# Patient Record
Sex: Male | Born: 1945 | Race: Black or African American | Hispanic: No | Marital: Single | Smoking: Current every day smoker
Health system: Southern US, Community
[De-identification: ages and names within clinical notes are randomized; demographics above are authoritative.]

## PROBLEM LIST (undated history)

## (undated) DIAGNOSIS — A419 Sepsis, unspecified organism: Secondary | ICD-10-CM

## (undated) DIAGNOSIS — Z590 Homelessness: Secondary | ICD-10-CM

## (undated) DIAGNOSIS — N390 Urinary tract infection, site not specified: Secondary | ICD-10-CM

## (undated) DIAGNOSIS — N1330 Unspecified hydronephrosis: Secondary | ICD-10-CM

## (undated) DIAGNOSIS — C61 Malignant neoplasm of prostate: Secondary | ICD-10-CM

## (undated) DIAGNOSIS — N179 Acute kidney failure, unspecified: Secondary | ICD-10-CM

## (undated) DIAGNOSIS — F1010 Alcohol abuse, uncomplicated: Secondary | ICD-10-CM

## (undated) DIAGNOSIS — F1411 Cocaine abuse, in remission: Secondary | ICD-10-CM

## (undated) DIAGNOSIS — D638 Anemia in other chronic diseases classified elsewhere: Secondary | ICD-10-CM

## (undated) HISTORY — PX: PERCUTANEOUS NEPHROSTOMY: SHX2208

---

## 2006-09-17 ENCOUNTER — Ambulatory Visit: Payer: Self-pay | Admitting: Cardiology

## 2006-09-17 ENCOUNTER — Inpatient Hospital Stay (HOSPITAL_COMMUNITY): Admission: EM | Admit: 2006-09-17 | Discharge: 2006-09-21 | Payer: Self-pay | Admitting: Emergency Medicine

## 2006-09-20 ENCOUNTER — Ambulatory Visit: Payer: Self-pay | Admitting: Cardiology

## 2010-09-08 NOTE — Cardiovascular Report (Signed)
NAME:  Jeff, [name] NO.:  [id number]   MEDICAL RECORD NO.:  [id number]          PATIENT TYPE:  INP   LOCATION:  4703                         FACILITY:  [facility]   PHYSICIAN:  Salvadore Farber, MD  DATE OF BIRTH:  [birth year]   DATE OF PROCEDURE:  09/21/2006  DATE OF DISCHARGE:                            CARDIAC CATHETERIZATION   PROCEDURE:  Left heart catheterization, left ventriculography, coronary  angiography.   INDICATIONS FOR STUDY:  The patient is a 65 year old gentleman who  presents with chest pain after using cocaine.  He ruled out for  myocardial infarction.  He is referred for diagnostic angiography and  possible percutaneous coronary intervention.   PROCEDURE/TECHNIQUE:  Informed consent was obtained.  Then under 1%  lidocaine local anesthesia, a 5-French sheath was placed in the right  common femoral artery using modified Seldinger technique.  Diagnostic  angiography and ventriculography were performed using JL-4, JR-4, and  pigtail catheters.  The patient tolerated the procedure well and was  transferred to holding room in stable condition.  Sheath was removed  there.   COMPLICATIONS:  None.   FINDINGS:  1. LV: 129/7/15.  EF 50% with anterolateral and apical hypokinesis.  2. No aortic stenosis or mitral regurgitation.  3. Left main:  Large vessel which wraps the apex of the heart to      supply much of the inferior wall.  It has only minor luminal      irregularities.  4. Ramus intermedius:  Moderate-sized vessel which is angiographically      normal.  5. Circumflex:  Moderate-sized vessel giving rise to a single      marginal.  He has a 40% stenosis in its midsection.  6. RCA:  Moderate-sized dominant vessel.  There is a calcified 50%      lesion of the mid vessel.   IMPRESSION/PLAN:  The patient has moderate nonobstructive coronary  disease.  He does have mildly reduced left ventricular this hypokinesis.  The patient has regional wall  motion abnormality.  I suspect this is  related to his cocaine use.  Cessation of cocaine use is strongly  advised.  Aspirin should be continued given his coronary disease.  Attention to secondary      Salvadore Farber, MD  Electronically Signed     WED/MEDQ  D:  09/21/2006  T:  09/21/2006  Job:  [id number]

## 2010-09-08 NOTE — H&P (Signed)
NAME:  Jeff [name] NO.:  [id number]   MEDICAL RECORD NO.:  [id number]          PATIENT TYPE:  INP   LOCATION:  A204                          FACILITY:  [facility]   PHYSICIAN:  Gerrit Friends. Dietrich Pates, MD, FACCDATE OF BIRTH:  August 15, 1945   DATE OF ADMISSION:  09/17/2006  DATE OF DISCHARGE:                              HISTORY & PHYSICAL   REFERRING PHYSICIAN:  Skeet Latch, M.D, Hospitalist at [facility]   HISTORY OF PRESENT ILLNESS:  A 65 year old gentleman with no prior  cardiovascular history admitted to hospital with chest pain in the  setting of cocaine use.  [name] describes 30 minutes of  moderately severe substernal chest pain with both burning and stabbing  quality.  There was some associated numbness and right arm as well as  mild dyspnea and diaphoresis.  [name] describes similar less  intense spells over the past few months.  He has also had episodes of  sudden loss of consciousness with some preceding lightheadedness.  These  have occurred at various times, including when he has worked outside in  warm weather.  He regained consciousness immediately upon hitting the  ground.  He has not suffered any history.   The patient has used cocaine for the past 18 months or so, fairly  regularly in recent months.  He also smokes cigarettes..  There is no  history of diabetes.  He has been told of hypertension in the past, but  has not recently taking medication.   The patient's chest pain resolved prior to arrival at the hospital.  His  initial EKG showed inferior T-wave inversion and J-point elevation in  the anterolateral leads that appeared to be consistent with early  repolarization.  On subsequent EKGs, the ST-segment elevation appeared  more pathologic.  Cardiac markers have been minimally abnormal with peak  troponin of 0.09 and a peak CPK of 400 with negative MB.  The patient  has felt well since admission.   Past medical history is  uncertain.  There may have been a previous CVA.  He also describes a possible history of colon cancer.  He has not  received any medical care for the past 2 years.   ALLERGIES:  No known drug allergies.[name] Kitchen   MEDICATIONS:  No current medications.   SOCIAL HISTORY:  No excessive use of alcohol; 30 pack-year history of  cigarette smoking, which continues at one pack per day.  Resides locally  with relatives.   FAMILY HISTORY:  Negative for cardiovascular disease.   REVIEW OF SYSTEMS:  The patient describes possible recent episodes of  scant hemoptysis.  He also notes GERD symptoms.  All other systems  reviewed and are negative.   PHYSICAL EXAMINATION:  GENERAL:  Well-developed, healthy-appearing  gentleman in no acute distress.  VITAL SIGNS:  The blood pressure is 130/70, heart rate of 50 and  regular, respirations 20, O2 saturation 94% on room air.  HEENT:  Anicteric sclerae; normal lids and conjunctiva; normal oral  mucosa.  NECK:  No jugular venous distension; normal carotid upstrokes without  bruits.  ENDOCRINE:  No thyromegaly.  HEMATOPOIETIC:  No adenopathy.  SKIN:  Well-healed scar over the right forehead.  LUNGS:  Clear.  ABDOMEN:  Soft and nontender; normal bowel sounds; no masses; no  organomegaly.  EXTREMITIES:  Normal distal pulses; no edema; no clubbing.  NEUROMUSCULAR:  Symmetric strength and tone; normal cranial nerves.   EKG:  Sinus bradycardia; borderline first-degree AV block; low voltage  in limb leads; inferior T-wave inversion with nondiagnostic Q-waves; J-  point elevation anterolaterally.   LABORATORY DATA:  Other laboratory notable for total cholesterol of 208,  triglycerides of 50, HDL 64 and LDL 132.  CBC is normal.  Chemistry  profile is normal.  Cocaine and THC detected on drug screen.  Cloudy  urine with mild pyuria and hematuria on urinalysis.   IMPRESSION:  [name] presents with fairly impressive symptoms, EKG  abnormalities and minor  abnormalities in cardiac markers..  This could  all be related directly to the vasoconstrictive effects of cocaine, but  underlying coronary disease must be considered.  I discussed the  benefits and risks of cardiac catheterization with him; he agrees to  proceed.   He has sinus bradycardia with recent episodes of syncope.  I doubt that  these are due to brady arrhythmia, but continued in-hospital monitoring  will allow [place] to determine if he has any significant arrhythmias.   His history of syncope is consistent with orthostatic hypotension.  Lying and standing blood pressures will be measured.  He may benefit  from an event recorder after hospital discharge.   His use of cocaine is obviously a problem.  He had he indicates that he  will abstain from such usage in the future.   Per      Gerrit Friends. Dietrich Pates, MD, [facility]  Electronically Signed     RMR/MEDQ  D:  09/20/2006  T:  09/20/2006  Job:  [id number]

## 2010-09-08 NOTE — Discharge Summary (Signed)
NAME:  Jeff [name], Jeff [name] NO.:  [id number]   MEDICAL RECORD NO.:  [id number]          PATIENT TYPE:  INP   LOCATION:  4703                         FACILITY:  [facility]   PHYSICIAN:  Salvadore Farber, MD  DATE OF BIRTH:  [birth year]   DATE OF ADMISSION:  09/20/2006  DATE OF DISCHARGE:  09/21/2006                               DISCHARGE SUMMARY   DISCHARGING PHYSICIAN:  Dr. Randa Evens   DISCHARGE DIAGNOSIS:  Chest pain in the setting of crack cocaine use  status post cardiac catheterization this admission showing  nonobstructive coronary artery disease.   PAST MEDICAL HISTORY:  1. Polysubstance abuse.  Patient with positive drug screen this      admission for THC and cocaine derivative.  2. Tobacco use one pack per day for approximately 30+ years and      questionable previous CVA.  3. Questionable history of colon cancer  4. History of hypertension.   PROCEDURES THIS ADMISSION:  Cardiac catheterization on day of discharge.   HOSPITAL COURSE:  [name] is a 65 year old African American  gentleman with no prior cardiovascular history who was initially  admitted to [facility] with complaints of chest pain in the  setting of cocaine use.  The patient's chest pain apparently resolved  prior to his arrival at [facility] his initial EKG showed  inferior T-wave inversion and J point elevation and anterior lateral  leads that appeared to be consistent with early repolarization.  Cardiac  markers minimally abnormal with a peak troponin of 0.09.  The patient's  medical history is as stated above.  However, the patient is a poor  historian and has not sought medical care for the past 2 years, and has  been using cocaine for the past 18 months or so on a fairly regular  basis.  The patient was seen by Dr. Dietrich Pates in consultation.  EKG  abnormalities noted.  Mild abnormalities in cardiac markers.  Dr.  Dietrich Pates felt the patient would benefit from  further evaluation, and  cardiac catheterization was recommended.  The risks and benefits were  discussed with the patient, and the patient agreed to proceed.  The  patient was transported to [facility] for further evaluation, taken to  the cath lab on Sep 21, 2006, by Dr. Samule Ohm.  The patient was  nonobstructive CAD.  Cessation of cocaine advised with recommendations  to continue aspirin therapy status post cardiac  catheterization/appropriate bed rest with stable vital signs.  The  patient is being discharged home post cath site to right groin stable.  The patient has been given the post cardiac catheterization and  discharge instructions along with a follow-up appoint with Dr. Dietrich Pates  for post cath check on June 11 at 3:15.  He has been given our phone  number at the [place] office to call  if he has any problems with his cath site.  He is to continue aspirin  325 mg daily and abstain from substance use and tobacco products.  The  patient verbalizes understanding of discharge instructions.  Duration of  discharge encounter 25 minutes.  Dorian Pod, ACNP      Salvadore Farber, MD  Electronically Signed    MB/MEDQ  D:  09/21/2006  T:  09/21/2006  Job:  [id number]

## 2010-09-08 NOTE — H&P (Signed)
NAME:  Jeff [name], Jeff [name] NO.:  [id number]   MEDICAL RECORD NO.:  [id number]          PATIENT TYPE:  INP   LOCATION:  A304                          FACILITY:  [facility]   PHYSICIAN:  Skeet Latch, DO    DATE OF BIRTH:  [birth year]   DATE OF ADMISSION:  09/17/2006  DATE OF DISCHARGE:  LH                              HISTORY & PHYSICAL   CHIEF COMPLAINT:  Chest pain.   HISTORY OF PRESENT ILLNESS:  This is a 65 year old African-American male  who presents with complaint of chest pain. The patient states that at  approximately 1:30 today, he was still okay, then developed substernal  burning, stabbing type chest pain at approximately 1/2 hour later. The  patient states that the pain persisted for about a half hour or so and  he also had some numbness in his right arm. The patient also complains  of some shortness of breath and some increased sweating also. The  patient states that he has been having on and off chest pain for  approximately 4 to 5 month, that he has not been seen by a physician.  The patient admits to using cocaine for approximately 1 1/2 years, on  and off, but pretty regularly over the last 4 months. The patient denies  any significant medical history. He states that he was diagnosed with  hypertension that was improved with medication that he stopped over a  year ago. The patient states that his right arm weakness/heaviness is  slightly improved on my examination and he denies any active chest pain  at this point. The patient also admits to some hemoptysis on and off for  the past month or so. States that is not daily but he does get a taste  of blood in his mouth at times, in the mornings. The patient admits to  some reflux type symptoms on and off also, that he is not currently on  any medication for.   PAST MEDICAL HISTORY:  Positive for hypertension, questionable previous  CVA, and acid reflux.   SOCIAL HISTORY:  Occasional drinker. Is a 1 pack  per day smoker for  approximately 30+ years. Positive history for cocaine abuse. He lives  with relatives.   ALLERGIES:  NO KNOWN DRUG ALLERGIES.   CURRENT MEDICATIONS:  None.   FAMILY HISTORY:  Negative.   REVIEW OF SYSTEMS:  GENERAL:  Negative for any fever or dizziness.  HEENT:  No eye pain, blurred vision. No ear pain, throat pain.  CARDIOVASCULAR:  Positive for chest pain. RESPIRATORY:  Positive for  shortness of breath. No cough or wheezing. Occasional hemoptysis.  GASTROINTESTINAL:  No abdominal pain, vomiting, or diarrhea.  GENITOURINARY:  No dysuria or flank pain. MUSCULOSKELETAL: No  arthralgias or back pain. All other systems are negative.   PHYSICAL EXAMINATION:  VITAL SIGNS:  Temperature 98.2, pulse 65,  respiratory rate 20, blood pressure 121/75. The patient is sating 98% on  room air.  HEENT:  Head is atraumatic, normocephalic. PERRLA. EO MI.  NECK:  Supple, nontender, nondistended.  CARDIOVASCULAR:  Regular rate and rhythm. S1 and S2. No murmur,  rub, or  gallop.  RESPIRATORY:  Lungs clear to auscultation bilaterally .No rales,  rhonchi, or wheezing.  ABDOMEN:  Soft, nontender, and nondistended. No rigidity, guarding.  Positive bowel sounds.  EXTREMITIES:  No clubbing, cyanosis, or edema.  NEUROLOGIC:  Alert and oriented times three. Cranial nerves 2-12 are  grossly intact.   LABORATORY DATA:  EKG showed normal sinus rhythm. No STT wave changes.   Chest x-ray revealed no acute findings.   Sodium 138, potassium 4.3, chloride 105, CO2 26, glucose 97, BUN 15,  creatinine 1.19. Total protein 7.1, AST 29, ALT 18. Alcohol level less  than 5. Urine, trace of hemoglobin, small bilirubin. Troponin is less  than 0.05. CK MB 3.7. Myoglobin is 106. White blood cell count 6.6  Hemoglobin and hematocrit 13.3 and 38.6. Platelets 274,000. Urine drug  screen positive for cocaine, positive for cannabinoids.   ASSESSMENT/PLAN:  1. Chest pain, probably secondary to cocaine  abuse. The patient will      be admitted for chest pain to the telemetry unit. Will repeat CBC      and B-met in the a.m. Secondary to patient's history of on and off      chest pain for the past 4 to 6 months, cardiology consultation will      be obtained. Pain medication will be given at 4 to 6 hours if      needed. Will repeat an EKG in the a.m. and repeat his a.m. labs and      add a lipid and TSH. Continue to monitor cardiac enzymes.  2. Polysubstance abuse with cocaine. ACT Team evaluation will also be      obtained. Will get patient on benzodiazepine t.i.d. p.r.n. for now.      The patient will be placed on DVT as well as GI prophylaxis. I do      not appreciate any rhabdomyolysis at this time but will obtain an      creatinine kinase to rule out rhabdomyolysis. Will aggressively      hydrate the patient at this time.      Skeet Latch, DO  Electronically Signed     SM/MEDQ  D:  09/18/2006  T:  09/18/2006  Job:  [id number]

## 2010-09-08 NOTE — Discharge Summary (Signed)
NAME:  Jeff [name], Jeff [name] NO.:  [id number]   MEDICAL RECORD NO.:  [id number]          PATIENT TYPE:  INP   LOCATION:  A204                          FACILITY:  [facility]   PHYSICIAN:  Marcello Moores, MD   DATE OF BIRTH:  07/22/1945   DATE OF ADMISSION:  09/20/2006  DATE OF DISCHARGE:  05/27/2008LH                               DISCHARGE SUMMARY   PRIMARY MEDICAL DOCTOR:  Unassigned.   DIAGNOSIS ON TRANSFER:  1. Cocaine induced chest pain with EKG abnormality, ST elevation in      the lateral leads.  2. Asymptomatic bradycardia.  3. Polysubstance abuse including cocaine.   HOSPITAL COURSE:  [name] is 65 year old man with no significant  past medical history except polysubstance abuse who presents to the  emergency room with chest discomfort after he was using cocaine hours  before.  The details of the characteristic of the chest pain is detailed  in the admission note.  After he presented to the emergency room, the  first EKG as well as cardiac markers were normal, but subsequent cardiac  markers and EKG was abnormal with elevated troponin up to 0.09 and EKG  abnormality with ST elevation in V4-V5 and V6, and the patient was put  on MI protocol and the patient remained asymptomatic in his hospital  stay without any chest pain and any dizziness.  His pulse rate was from  45 up to 60, normal sinus rhythm, and today he was evaluated by  cardiologist and was transferred to [facility] for possible  cardiac catheterization.   PHYSICAL EXAMINATION:  GENERAL:  The patient is very stable,  asymptomatic.  VITAL SIGNS:  Temperature is 97, pulse rate is 53, respiratory rate is  18, systolic blood pressure is 120/76, saturation is 95%.  HEENT:  He has pink conjunctivae, nonicteric sclera.  CHEST:  Good air entry bilaterally clear  CVS:  S1, S2 well heard, regular, no murmur, sinus bradycardia.  ABDOMEN:  Soft, normoactive bowel sounds.  EXTREMITIES:  No pedal  edema.  CNS:  He is alert and well oriented.   LABORATORY DATA:  Today, sodium is 139, potassium 3.9, chloride is 107,  bicarb is 28, glucose is 88, BUN 8, creatinine 1.05.  Past troponin was  0.009, and his EKG is sinus bradycardia with persistent ST elevation on  the V4, V5 and V6.   MEDICATION DURING TRANSFER:  1. Nitroglycerin 0.4 mg sublingual p.r.n.  2. Morphine p.r.n..  3. Plavix 75 mg p.o. daily.  4. Lovenox 70 mg every 12 hours.  5. Aspirin 325 mg p.o. daily.   DISPOSITION:  The patient is very stable despite he has low pulse rate  and history of chest pain.  He remained asymptomatic in the hospital for  two days, but considering his episode of chest pain and abnormal EKG, he  was transferred for possible cardiac catheterization to look for  coronary artery disease and afterwards evaluated by cardiologist.      Marcello Moores, MD  Electronically Signed     MT/MEDQ  D:  09/20/2006  T:  09/20/2006  Job:  [id number]

## 2012-12-23 ENCOUNTER — Encounter (HOSPITAL_COMMUNITY): Payer: Self-pay | Admitting: *Deleted

## 2012-12-23 ENCOUNTER — Emergency Department (HOSPITAL_COMMUNITY)
Admission: EM | Admit: 2012-12-23 | Discharge: 2012-12-23 | Disposition: A | Discharge status: Home / Self Care | Payer: Medicare Other | Attending: Emergency Medicine | Admitting: Emergency Medicine

## 2012-12-23 DIAGNOSIS — F17200 Nicotine dependence, unspecified, uncomplicated: Secondary | ICD-10-CM | POA: Insufficient documentation

## 2012-12-23 DIAGNOSIS — S61219A Laceration without foreign body of unspecified finger without damage to nail, initial encounter: Secondary | ICD-10-CM

## 2012-12-23 DIAGNOSIS — Z23 Encounter for immunization: Secondary | ICD-10-CM | POA: Insufficient documentation

## 2012-12-23 DIAGNOSIS — W292XXA Contact with other powered household machinery, initial encounter: Secondary | ICD-10-CM | POA: Insufficient documentation

## 2012-12-23 DIAGNOSIS — Y9389 Activity, other specified: Secondary | ICD-10-CM | POA: Insufficient documentation

## 2012-12-23 DIAGNOSIS — S61209A Unspecified open wound of unspecified finger without damage to nail, initial encounter: Secondary | ICD-10-CM | POA: Insufficient documentation

## 2012-12-23 DIAGNOSIS — Y929 Unspecified place or not applicable: Secondary | ICD-10-CM | POA: Insufficient documentation

## 2012-12-23 MED ORDER — TRAMADOL HCL 50 MG PO TABS: 50.0000 mg | ORAL_TABLET | Freq: Four times a day (QID) | ORAL | Status: DC | PRN

## 2012-12-23 MED ORDER — LIDOCAINE HCL (PF) 1 % IJ SOLN
INTRAMUSCULAR | Status: AC
  Administered 2012-12-23: 5 mL
  Filled 2012-12-23: qty 15

## 2012-12-23 MED ORDER — NAPROXEN 500 MG PO TABS: 500.0000 mg | ORAL_TABLET | Freq: Two times a day (BID) | ORAL | Status: DC

## 2012-12-23 MED ORDER — BACITRACIN 500 UNIT/GM EX OINT
1.0000 "application " | TOPICAL_OINTMENT | Freq: Two times a day (BID) | CUTANEOUS | Status: DC
  Administered 2012-12-23: 1 via TOPICAL
  Filled 2012-12-23 (×5): qty 0.9

## 2012-12-23 MED ORDER — TETANUS-DIPHTH-ACELL PERTUSSIS 5-2.5-18.5 LF-MCG/0.5 IM SUSP
0.5000 mL | Freq: Once | INTRAMUSCULAR | Status: AC
  Administered 2012-12-23: 0.5 mL via INTRAMUSCULAR
  Filled 2012-12-23: qty 0.5

## 2012-12-23 MED ORDER — LIDOCAINE HCL (PF) 1 % IJ SOLN
INTRAMUSCULAR | Status: AC
  Filled 2012-12-23: qty 5

## 2012-12-23 NOTE — ED Notes (Signed)
Cut left index finger w/electric hedge trimmers today.   Tip of finger w/large irregular cuts over fingertip. Small, linear laceration below MIP.

## 2012-12-23 NOTE — ED Provider Notes (Addendum)
CSN: [id number]     Arrival date & time 12/23/12  1732 History  This chart was scribed for Vida Roller, MD, by Yevette Edwards, ED Scribe. This patient was seen in room APA03/APA03 and the patient's care was started at 5:57 PM.   First MD Initiated Contact with Patient 12/23/12 1750     Chief Complaint  Patient presents with  . Finger Injury    The history is provided by the patient. No language interpreter was used.   HPI Comments: Jeff [name] is a 67 y.o. male who presents to the Emergency Department complaining of an acute, macerated laceration to his left index finger which occurred six hours ago when he injured his index finger on a pair of electric hedge trimmers. He reports the pain to his finger is 9/10. The pt also has a smaller lac to the same digit. He denies any numbness to his finger. He is unsure of his last tetanus.n the bleeding was initially controlled with pressure which improved significantly.  No blood thinners.  History reviewed. No pertinent past medical history. History reviewed. No pertinent past surgical history. History reviewed. No pertinent family history. History  Substance Use Topics  . Smoking status: Current Every Day Smoker -- 0.50 packs/day    Types: Cigarettes  . Smokeless tobacco: Not on file  . Alcohol Use: Yes     Comment: occassional 2-3x week    Review of Systems  Constitutional: Negative for fever and chills.  Skin: Positive for wound.  Neurological: Negative for numbness.  All other systems reviewed and are negative.   Allergies  Review of patient's allergies indicates no known allergies.  Home Medications   Current Outpatient Rx  Name  Route  Sig  Dispense  Refill  . naproxen (NAPROSYN) 500 MG tablet   Oral   Take 1 tablet (500 mg total) by mouth 2 (two) times daily with a meal.   30 tablet   0   . traMADol (ULTRAM) 50 MG tablet   Oral   Take 1 tablet (50 mg total) by mouth every 6 (six) hours as needed for pain.   15  tablet   0     Triage Vitals: BP 124/69  Pulse 87  Temp(Src) 98.6 F (37 C) (Oral)  Resp 19  Ht 6' (1.829 m)  Wt 153 lb 4 oz (69.514 kg)  BMI 20.78 kg/m2  SpO2 97%  Physical Exam  Nursing note and vitals reviewed. Constitutional: He is oriented to person, place, and time. He appears well-developed and well-nourished. No distress.  HENT:  Head: Normocephalic and atraumatic.  Eyes: EOM are normal.  Neck: Neck supple. No tracheal deviation present.  Cardiovascular: Normal rate.   Pulmonary/Chest: Effort normal. No respiratory distress.  Musculoskeletal: Normal range of motion.  Neurological: He is alert and oriented to person, place, and time.  Skin: Skin is warm and dry.  Left second digit palmar surface 1.5 cm linear lac over the proximal phalanx. Stellate lac to palmar surface of distal phalanx of the same finger approx 3 cm in size No nail bed injury.  Psychiatric: He has a normal mood and affect. His behavior is normal.    ED Course  Procedures (including critical care time)  DIAGNOSTIC STUDIES:  Oxygen Saturation is 97% on room air, normal by my interpretation.    COORDINATION OF CARE:  6:05 PM- Discussed treatment plan with patient, and the patient agreed to the plan.   LACERATION REPAIR PROCEDURE NOTE The patient's identification was  confirmed and consent was obtained. This procedure was performed by Vida Roller, MD at 6:24 PM. Site: Left distal of second digit  Sterile procedures observed Anesthetic used (type and amt): 5 cc of lido; digital block  Suture type/size: 5.0 prolene  Length: 1.5 cm  # of Sutures: 2 Technique: simple interrupted Complexity: simple, linear, superficial Antibx ointment applied Tetanus ordered Site anesthetized, irrigated with 3 liters of NS, explored to the base of the wound- no foreign bodies visualized. Wound has good hemostasis. The site covered with dry, steriledressing.  Patient tolerated procedure well without  complications. Instructions for care discussed verbally and patient provided with additional written instructions for homecare and f/u with a hand specialist.    LACERATION REPAIR PROCEDURE NOTE The patient's identification was confirmed and consent was obtained. This procedure was performed by Vida Roller, MD at 6:35 PM. Site: Left distal of second digit  Sterile procedures observed Anesthetic used (type and amt): 5 cc of lido; digital block  Suture type/size: 5.0 prolene  Length: approximately 3 cm stellate  # of Sutures: 5 Technique: Simple interrupted Complexity: Complex, stellate, devitalized tissue removed by sharp debridement with scissors and forcept Antibx ointment applied Tetanus ordered Site anesthetized, irrigated with 3 liters of NS, explored to the base of the stellate wound-no foreign bodies seen.  Site covered with dry, sterile dressing.  Patient tolerated procedure well without complications. Instructions for care discussed verbally and patient provided with additional written instructions for homecare and f/u with a hand specialist.   Small area in middle of wound unable to be closed due to lack of tissue - topical abx applied - hand f/u.  Pt informed of signs of infection - need to return.  Agrees to plan.  I have visualized both wounds to the bottom of the wounds and no FB seen, normal ROM of all joints without swelling or deformity - no indication for imaging, this is all soft tissue.  In fact on initial exam before evaluation under anesthesia, the pt had FROM against resistance to flexion and extention and normal sensation of digit.  He has no visualized bone and no pain with palpation over the bones.    Labs Review Labs Reviewed - No data to display Imaging Review No results found.  MDM   1. Laceration of finger, initial encounter      I personally performed the services described in this documentation, which was scribed in my presence. The recorded  information has been reviewed and is accurate.        Vida Roller, MD 12/23/12 1850  Vida Roller, MD 01/01/13 [phone]

## 2012-12-23 NOTE — ED Notes (Signed)
Wound dressed with  Bacitracin, adaptic, sterile 2x2s and gauze.

## 2012-12-25 ENCOUNTER — Emergency Department (HOSPITAL_COMMUNITY)
Admission: EM | Admit: 2012-12-25 | Discharge: 2012-12-25 | Disposition: A | Discharge status: Home / Self Care | Payer: Medicare Other | Attending: Emergency Medicine | Admitting: Emergency Medicine

## 2012-12-25 ENCOUNTER — Encounter (HOSPITAL_COMMUNITY): Payer: Self-pay | Admitting: *Deleted

## 2012-12-25 DIAGNOSIS — F17200 Nicotine dependence, unspecified, uncomplicated: Secondary | ICD-10-CM | POA: Insufficient documentation

## 2012-12-25 DIAGNOSIS — S61219D Laceration without foreign body of unspecified finger without damage to nail, subsequent encounter: Secondary | ICD-10-CM

## 2012-12-25 DIAGNOSIS — Z4801 Encounter for change or removal of surgical wound dressing: Secondary | ICD-10-CM | POA: Insufficient documentation

## 2012-12-25 MED ORDER — SULFAMETHOXAZOLE-TRIMETHOPRIM 800-160 MG PO TABS: 1.0000 | ORAL_TABLET | Freq: Two times a day (BID) | ORAL | Status: DC

## 2012-12-25 MED ORDER — BACITRACIN-NEOMYCIN-POLYMYXIN 400-5-5000 EX OINT
TOPICAL_OINTMENT | Freq: Once | CUTANEOUS | Status: AC
  Administered 2012-12-25: 1 via TOPICAL
  Filled 2012-12-25: qty 1

## 2012-12-25 NOTE — ED Provider Notes (Signed)
CSN: [id number]     Arrival date & time 12/25/12  1147 History   First MD Initiated Contact with Patient 12/25/12 1216     No chief complaint on file.  (Consider location/radiation/quality/duration/timing/severity/associated sxs/prior Treatment) HPI Comments: Patient returns for recheck of a laceration repair to the left index finger.  States the laceration occurred as result of using hedge trimmers.  He was seen here on 12/23/12 and had sutures placed.  He reports some drainage and bleeding through the dressing and requests dressing change.  He denies fever, swelling, numbness or pain to the finger.  Patient is a 67 y.o. male presenting with wound check. The history is provided by the patient.  Wound Check This is a new problem. The current episode started in the past 7 days. The problem has been unchanged. Pertinent negatives include no arthralgias, chills, fever, joint swelling, numbness, swollen glands or weakness. The symptoms are aggravated by bending. He has tried nothing for the symptoms. The treatment provided mild relief.    History reviewed. No pertinent past medical history. History reviewed. No pertinent past surgical history. History reviewed. No pertinent family history. History  Substance Use Topics  . Smoking status: Current Every Day Smoker -- 0.50 packs/day    Types: Cigarettes  . Smokeless tobacco: Not on file  . Alcohol Use: Yes     Comment: occassional 2-3x week    Review of Systems  Constitutional: Negative for fever and chills.  Musculoskeletal: Negative for back pain, joint swelling and arthralgias.  Skin: Positive for wound. Negative for color change.       Laceration   Neurological: Negative for dizziness, weakness and numbness.  Hematological: Does not bruise/bleed easily.  All other systems reviewed and are negative.    Allergies  Review of patient's allergies indicates no known allergies.  Home Medications   Current Outpatient Rx  Name  Route  Sig   Dispense  Refill  . naproxen (NAPROSYN) 500 MG tablet   Oral   Take 1 tablet (500 mg total) by mouth 2 (two) times daily with a meal.   30 tablet   0   . sulfamethoxazole-trimethoprim (SEPTRA DS) 800-160 MG per tablet   Oral   Take 1 tablet by mouth 2 (two) times daily.   20 tablet   0   . traMADol (ULTRAM) 50 MG tablet   Oral   Take 1 tablet (50 mg total) by mouth every 6 (six) hours as needed for pain.   15 tablet   0    BP 103/58  Pulse 68  Temp(Src) 98 F (36.7 C) (Oral)  Resp 20  Ht 5\' 6"  (1.676 m)  Wt 153 lb (69.4 kg)  BMI 24.71 kg/m2  SpO2 97% Physical Exam  Nursing note and vitals reviewed. Constitutional: He is oriented to person, place, and time. He appears well-developed and well-nourished. No distress.  HENT:  Head: Normocephalic and atraumatic.  Cardiovascular: Normal rate, regular rhythm, normal heart sounds and intact distal pulses.   No murmur heard. Pulmonary/Chest: Effort normal and breath sounds normal. No respiratory distress.  Musculoskeletal: He exhibits no edema and no tenderness.  Neurological: He is alert and oriented to person, place, and time. He exhibits normal muscle tone. Coordination normal.  Skin: Skin is warm. Laceration noted.  Irregular laceration to the left index finger with sutures placed along the laceration border.  Intact sutures also present to a proximal lac of same finger.  No active bleeding, edema, purulent drainage or odor.  Pink  granulation tissue present.  Pt has full ROM of finger, NV intact    ED Course  Procedures (including critical care time) Labs Review Labs Reviewed - No data to display Imaging Review No results found.  MDM   1. Finger laceration, subsequent encounter     Irregular laceration to the palmar aspect of the distal left index finger.  No active bleeding or drainage, but bloody drainage present on his dressing.  granulation tissue is present.  Pt has full ROM of the finger.  NV intact.  Pt  agrees to f/u with hand surgeon, I will start patient on antibiotic  Patient was also evaluated by Dr. Blinda Leatherwood and care plan discussed.   Finger dressing applied.  Pt stable for discharge, understands importance of other f/u   Jeff [name] L. Jetty Berland, PA-C 12/27/12 1255

## 2012-12-25 NOTE — ED Notes (Signed)
Here for wound  Check and dsg change.  To LMF

## 2012-12-27 NOTE — ED Provider Notes (Signed)
Medical screening examination/treatment/procedure(s) were conducted as a shared visit with non-physician practitioner(s) and myself.  I personally evaluated the patient during the encounter.  Patient presents for repeat evaluation of his wound. Patient has had persistent bleeding because the wound had tissue and was not completely closed because of this. It appears to be healing well, no sign of infection.   Gilda Crease, MD 12/27/12 1345

## 2014-01-25 ENCOUNTER — Encounter (HOSPITAL_COMMUNITY): Payer: Self-pay | Admitting: Emergency Medicine

## 2014-01-25 ENCOUNTER — Inpatient Hospital Stay (HOSPITAL_COMMUNITY)
Admission: EM | Admit: 2014-01-25 | Discharge: 2014-02-01 | DRG: 982 | Disposition: A | Discharge status: Home Health | Payer: Medicare Other | Attending: Internal Medicine | Admitting: Internal Medicine

## 2014-01-25 DIAGNOSIS — N139 Obstructive and reflux uropathy, unspecified: Secondary | ICD-10-CM | POA: Diagnosis present

## 2014-01-25 DIAGNOSIS — F17210 Nicotine dependence, cigarettes, uncomplicated: Secondary | ICD-10-CM | POA: Diagnosis present

## 2014-01-25 DIAGNOSIS — IMO0002 Reserved for concepts with insufficient information to code with codable children: Secondary | ICD-10-CM | POA: Diagnosis present

## 2014-01-25 DIAGNOSIS — N179 Acute kidney failure, unspecified: Secondary | ICD-10-CM | POA: Diagnosis present

## 2014-01-25 DIAGNOSIS — F1410 Cocaine abuse, uncomplicated: Secondary | ICD-10-CM | POA: Diagnosis present

## 2014-01-25 DIAGNOSIS — K449 Diaphragmatic hernia without obstruction or gangrene: Secondary | ICD-10-CM | POA: Diagnosis present

## 2014-01-25 DIAGNOSIS — D638 Anemia in other chronic diseases classified elsewhere: Secondary | ICD-10-CM | POA: Diagnosis present

## 2014-01-25 DIAGNOSIS — Z809 Family history of malignant neoplasm, unspecified: Secondary | ICD-10-CM

## 2014-01-25 DIAGNOSIS — Z5900 Homelessness unspecified: Secondary | ICD-10-CM

## 2014-01-25 DIAGNOSIS — C61 Malignant neoplasm of prostate: Secondary | ICD-10-CM | POA: Diagnosis not present

## 2014-01-25 DIAGNOSIS — M899 Disorder of bone, unspecified: Secondary | ICD-10-CM | POA: Diagnosis present

## 2014-01-25 DIAGNOSIS — C799 Secondary malignant neoplasm of unspecified site: Secondary | ICD-10-CM | POA: Diagnosis present

## 2014-01-25 DIAGNOSIS — N1330 Unspecified hydronephrosis: Secondary | ICD-10-CM | POA: Diagnosis present

## 2014-01-25 DIAGNOSIS — M549 Dorsalgia, unspecified: Secondary | ICD-10-CM | POA: Diagnosis not present

## 2014-01-25 DIAGNOSIS — N1339 Other hydronephrosis: Secondary | ICD-10-CM | POA: Diagnosis present

## 2014-01-25 DIAGNOSIS — R229 Localized swelling, mass and lump, unspecified: Secondary | ICD-10-CM

## 2014-01-25 DIAGNOSIS — R1900 Intra-abdominal and pelvic swelling, mass and lump, unspecified site: Secondary | ICD-10-CM

## 2014-01-25 DIAGNOSIS — C786 Secondary malignant neoplasm of retroperitoneum and peritoneum: Secondary | ICD-10-CM | POA: Diagnosis present

## 2014-01-25 DIAGNOSIS — N189 Chronic kidney disease, unspecified: Secondary | ICD-10-CM | POA: Diagnosis present

## 2014-01-25 DIAGNOSIS — Z6820 Body mass index (BMI) 20.0-20.9, adult: Secondary | ICD-10-CM

## 2014-01-25 DIAGNOSIS — F1411 Cocaine abuse, in remission: Secondary | ICD-10-CM | POA: Diagnosis present

## 2014-01-25 DIAGNOSIS — C7951 Secondary malignant neoplasm of bone: Secondary | ICD-10-CM | POA: Diagnosis present

## 2014-01-25 HISTORY — DX: Malignant neoplasm of prostate: C61

## 2014-01-25 HISTORY — DX: Acute kidney failure, unspecified: N17.9

## 2014-01-25 HISTORY — DX: Homelessness: Z59.0

## 2014-01-25 HISTORY — DX: Anemia in other chronic diseases classified elsewhere: D63.8

## 2014-01-25 HISTORY — DX: Unspecified hydronephrosis: N13.30

## 2014-01-25 HISTORY — DX: Cocaine abuse, in remission: F14.11

## 2014-01-25 HISTORY — DX: Alcohol abuse, uncomplicated: F10.10

## 2014-01-25 LAB — URINALYSIS, ROUTINE W REFLEX MICROSCOPIC
BILIRUBIN URINE: NEGATIVE
GLUCOSE, UA: NEGATIVE mg/dL
Ketones, ur: NEGATIVE mg/dL
Leukocytes, UA: NEGATIVE
Nitrite: NEGATIVE
Protein, ur: 100 mg/dL — AB
SPECIFIC GRAVITY, URINE: 1.015 (ref 1.005–1.030)
UROBILINOGEN UA: 0.2 mg/dL (ref 0.0–1.0)
pH: 5.5 (ref 5.0–8.0)

## 2014-01-25 LAB — URINE MICROSCOPIC-ADD ON

## 2014-01-25 NOTE — ED Notes (Signed)
Pain rt lower back and down rt leg, onset today, Says he has been smoking crack today

## 2014-01-25 NOTE — ED Provider Notes (Signed)
CSN: [id number]     Arrival date & time 01/25/14  2139 History   First MD Initiated Contact with Patient 01/25/14 2313     Chief Complaint  Patient presents with  . Back Pain     (Consider location/radiation/quality/duration/timing/severity/associated sxs/prior Treatment) Patient is a 68 y.o. male presenting with back pain. The history is provided by the patient.  Back Pain Location:  Lumbar spine Radiates to:  R posterior upper leg Pain severity:  Severe Pain is:  Same all the time Duration:  2 hours Timing:  Constant Progression:  Unchanged Chronicity:  Chronic Relieved by:  Nothing Worsened by:  Ambulation, movement and bending (urination) Ineffective treatments:  None tried Associated symptoms: abdominal pain and dysuria   Associated symptoms: no bowel incontinence, no chest pain and no fever    Jeff [name] is a 68 y.o. male who presents to the ED with low back pain, abdominal pain and n/v that started approximately 9:30 tonight after he was smoking crack.   History reviewed. No pertinent past medical history. History reviewed. No pertinent past surgical history. History reviewed. No pertinent family history. History  Substance Use Topics  . Smoking status: Current Every Day Smoker -- 0.50 packs/day    Types: Cigarettes  . Smokeless tobacco: Not on file  . Alcohol Use: Yes     Comment: occassional 2-3x week    Review of Systems  Constitutional: Positive for chills. Negative for fever.  HENT: Negative.   Eyes: Negative for pain and visual disturbance.  Respiratory: Positive for cough. Negative for wheezing.   Cardiovascular: Negative for chest pain.  Gastrointestinal: Positive for nausea, vomiting and abdominal pain. Negative for bowel incontinence.  Genitourinary: Positive for dysuria.  Musculoskeletal: Positive for back pain.  Skin: Negative for rash.  Psychiatric/Behavioral: Negative for confusion. The patient is not nervous/anxious.       Allergies   Review of patient's allergies indicates no known allergies.  Home Medications   Prior to Admission medications   Medication Sig Start Date End Date Taking? Authorizing Provider  naproxen (NAPROSYN) 500 MG tablet Take 1 tablet (500 mg total) by mouth 2 (two) times daily with a meal. 12/23/12   Johnna Acosta, MD  sulfamethoxazole-trimethoprim (SEPTRA DS) 800-160 MG per tablet Take 1 tablet by mouth 2 (two) times daily. 12/25/12   Tammy L. Triplett, PA-C  traMADol (ULTRAM) 50 MG tablet Take 1 tablet (50 mg total) by mouth every 6 (six) hours as needed for pain. 12/23/12   Johnna Acosta, MD   BP 119/67  Pulse 81  Temp(Src) 97.6 F (36.4 C) (Oral)  Ht 6' (1.829 m)  Wt 135 lb (61.236 kg)  BMI 18.31 kg/m2  SpO2 100% Physical Exam  Nursing note and vitals reviewed. Constitutional: He is oriented to person, place, and time. He appears well-developed and well-nourished.  HENT:  Head: Normocephalic.  Eyes: EOM are normal.  Neck: Neck supple.  Cardiovascular: Normal rate.   Pulmonary/Chest: Effort normal.  Abdominal: Soft. There is tenderness in the suprapubic area.  Right flank pain  Musculoskeletal: Normal range of motion.  Neurological: He is alert and oriented to person, place, and time. No cranial nerve deficit.  Skin: Skin is warm and dry.  Psychiatric: He has a normal mood and affect. His behavior is normal.    ED Course  Procedures  MDM   Results for orders placed during the hospital encounter of 01/25/14 (from the past 24 hour(s))  URINALYSIS, ROUTINE W REFLEX MICROSCOPIC  Status: Abnormal   Collection Time    01/25/14 11:31 PM      Result Value Ref Range   Color, Urine YELLOW  YELLOW   APPearance CLEAR  CLEAR   Specific Gravity, Urine 1.015  1.005 - 1.030   pH 5.5  5.0 - 8.0   Glucose, UA NEGATIVE  NEGATIVE mg/dL   Hgb urine dipstick LARGE (*) NEGATIVE   Bilirubin Urine NEGATIVE  NEGATIVE   Ketones, ur NEGATIVE  NEGATIVE mg/dL   Protein, ur 100 (*) NEGATIVE mg/dL    Urobilinogen, UA 0.2  0.0 - 1.0 mg/dL   Nitrite NEGATIVE  NEGATIVE   Leukocytes, UA NEGATIVE  NEGATIVE  URINE MICROSCOPIC-ADD ON     Status: Abnormal   Collection Time    01/25/14 11:31 PM      Result Value Ref Range   Squamous Epithelial / LPF RARE  RARE   WBC, UA 3-6  <3 WBC/hpf   RBC / HPF 11-20  <3 RBC/hpf   Bacteria, UA FEW (*) RARE    68 y.o. male with hematuria, dysuria, nausea and abdominal and flank pain that started earlier tonight. Patient awaiting CT scan. Care turned over to Dr. Stark Jock @ [place] am.    Jeff Murrain, NP 01/26/14 [phone]

## 2014-01-26 ENCOUNTER — Inpatient Hospital Stay (HOSPITAL_COMMUNITY): Payer: Medicare Other

## 2014-01-26 ENCOUNTER — Encounter (HOSPITAL_COMMUNITY): Payer: Self-pay | Admitting: Emergency Medicine

## 2014-01-26 ENCOUNTER — Emergency Department (HOSPITAL_COMMUNITY): Payer: Medicare Other

## 2014-01-26 DIAGNOSIS — C7951 Secondary malignant neoplasm of bone: Secondary | ICD-10-CM | POA: Diagnosis not present

## 2014-01-26 DIAGNOSIS — K449 Diaphragmatic hernia without obstruction or gangrene: Secondary | ICD-10-CM | POA: Diagnosis not present

## 2014-01-26 DIAGNOSIS — M899 Disorder of bone, unspecified: Secondary | ICD-10-CM | POA: Diagnosis present

## 2014-01-26 DIAGNOSIS — N179 Acute kidney failure, unspecified: Secondary | ICD-10-CM | POA: Diagnosis not present

## 2014-01-26 DIAGNOSIS — C799 Secondary malignant neoplasm of unspecified site: Secondary | ICD-10-CM | POA: Diagnosis not present

## 2014-01-26 DIAGNOSIS — R1900 Intra-abdominal and pelvic swelling, mass and lump, unspecified site: Secondary | ICD-10-CM

## 2014-01-26 DIAGNOSIS — C786 Secondary malignant neoplasm of retroperitoneum and peritoneum: Secondary | ICD-10-CM | POA: Diagnosis not present

## 2014-01-26 DIAGNOSIS — IMO0002 Reserved for concepts with insufficient information to code with codable children: Secondary | ICD-10-CM | POA: Diagnosis present

## 2014-01-26 DIAGNOSIS — Z590 Homelessness: Secondary | ICD-10-CM | POA: Diagnosis not present

## 2014-01-26 DIAGNOSIS — C61 Malignant neoplasm of prostate: Secondary | ICD-10-CM | POA: Diagnosis present

## 2014-01-26 DIAGNOSIS — Z6820 Body mass index (BMI) 20.0-20.9, adult: Secondary | ICD-10-CM | POA: Diagnosis not present

## 2014-01-26 DIAGNOSIS — F1410 Cocaine abuse, uncomplicated: Secondary | ICD-10-CM | POA: Diagnosis not present

## 2014-01-26 DIAGNOSIS — F1411 Cocaine abuse, in remission: Secondary | ICD-10-CM

## 2014-01-26 DIAGNOSIS — N139 Obstructive and reflux uropathy, unspecified: Secondary | ICD-10-CM | POA: Diagnosis not present

## 2014-01-26 DIAGNOSIS — Z809 Family history of malignant neoplasm, unspecified: Secondary | ICD-10-CM | POA: Diagnosis not present

## 2014-01-26 DIAGNOSIS — R229 Localized swelling, mass and lump, unspecified: Secondary | ICD-10-CM

## 2014-01-26 DIAGNOSIS — D638 Anemia in other chronic diseases classified elsewhere: Secondary | ICD-10-CM | POA: Diagnosis not present

## 2014-01-26 DIAGNOSIS — F17210 Nicotine dependence, cigarettes, uncomplicated: Secondary | ICD-10-CM | POA: Diagnosis not present

## 2014-01-26 DIAGNOSIS — N1339 Other hydronephrosis: Secondary | ICD-10-CM | POA: Diagnosis not present

## 2014-01-26 DIAGNOSIS — N1330 Unspecified hydronephrosis: Secondary | ICD-10-CM

## 2014-01-26 DIAGNOSIS — M549 Dorsalgia, unspecified: Secondary | ICD-10-CM | POA: Diagnosis present

## 2014-01-26 DIAGNOSIS — N189 Chronic kidney disease, unspecified: Secondary | ICD-10-CM | POA: Diagnosis not present

## 2014-01-26 HISTORY — DX: Anemia in other chronic diseases classified elsewhere: D63.8

## 2014-01-26 HISTORY — DX: Unspecified hydronephrosis: N13.30

## 2014-01-26 HISTORY — DX: Cocaine abuse, in remission: F14.11

## 2014-01-26 HISTORY — DX: Acute kidney failure, unspecified: N17.9

## 2014-01-26 LAB — COMPREHENSIVE METABOLIC PANEL
ALK PHOS: 137 U/L — AB (ref 39–117)
ALT: 7 U/L (ref 0–53)
AST: 32 U/L (ref 0–37)
Albumin: 3.6 g/dL (ref 3.5–5.2)
Anion gap: 14 (ref 5–15)
BUN: 17 mg/dL (ref 6–23)
CALCIUM: 9.2 mg/dL (ref 8.4–10.5)
CHLORIDE: 100 meq/L (ref 96–112)
CO2: 22 meq/L (ref 19–32)
Creatinine, Ser: 1.56 mg/dL — ABNORMAL HIGH (ref 0.50–1.35)
GFR, EST AFRICAN AMERICAN: 51 mL/min — AB (ref >90)
GFR, EST NON AFRICAN AMERICAN: 44 mL/min — AB (ref >90)
GLUCOSE: 84 mg/dL (ref 70–99)
POTASSIUM: 4.4 meq/L (ref 3.7–5.3)
SODIUM: 136 meq/L — AB (ref 137–147)
Total Bilirubin: 0.3 mg/dL (ref 0.3–1.2)
Total Protein: 7.4 g/dL (ref 6.0–8.3)

## 2014-01-26 LAB — CBC WITH DIFFERENTIAL/PLATELET
BASOS ABS: 0 10*3/uL (ref 0.0–0.1)
Basophils Relative: 0 % (ref 0–1)
Eosinophils Absolute: 0.1 10*3/uL (ref 0.0–0.7)
Eosinophils Relative: 1 % (ref 0–5)
HCT: 29.2 % — ABNORMAL LOW (ref 39.0–52.0)
Hemoglobin: 10.1 g/dL — ABNORMAL LOW (ref 13.0–17.0)
LYMPHS ABS: 1.3 10*3/uL (ref 0.7–4.0)
LYMPHS PCT: 20 % (ref 12–46)
MCH: 30 pg (ref 26.0–34.0)
MCHC: 34.6 g/dL (ref 30.0–36.0)
MCV: 86.6 fL (ref 78.0–100.0)
Monocytes Absolute: 0.5 10*3/uL (ref 0.1–1.0)
Monocytes Relative: 8 % (ref 3–12)
NEUTROS ABS: 4.4 10*3/uL (ref 1.7–7.7)
NEUTROS PCT: 71 % (ref 43–77)
PLATELETS: 327 10*3/uL (ref 150–400)
RBC: 3.37 MIL/uL — AB (ref 4.22–5.81)
RDW: 14.8 % (ref 11.5–15.5)
WBC: 6.3 10*3/uL (ref 4.0–10.5)

## 2014-01-26 LAB — RAPID URINE DRUG SCREEN, HOSP PERFORMED
Amphetamines: NOT DETECTED
Barbiturates: NOT DETECTED
Benzodiazepines: NOT DETECTED
COCAINE: POSITIVE — AB
Opiates: NOT DETECTED
Tetrahydrocannabinol: NOT DETECTED

## 2014-01-26 LAB — ETHANOL: Alcohol, Ethyl (B): <11 mg/dL (ref 0–11)

## 2014-01-26 LAB — HIV ANTIBODY (ROUTINE TESTING W REFLEX): HIV 1&2 Ab, 4th Generation: NONREACTIVE

## 2014-01-26 MED ORDER — SENNA 8.6 MG PO TABS
1.0000 | ORAL_TABLET | Freq: Two times a day (BID) | ORAL | Status: DC
  Administered 2014-01-26 – 2014-01-31 (×11): 8.6 mg via ORAL
  Filled 2014-01-26 (×16): qty 1

## 2014-01-26 MED ORDER — ACETAMINOPHEN 325 MG PO TABS
650.0000 mg | ORAL_TABLET | Freq: Four times a day (QID) | ORAL | Status: DC | PRN
  Administered 2014-01-28: 650 mg via ORAL
  Filled 2014-01-26: qty 2

## 2014-01-26 MED ORDER — SODIUM CHLORIDE 0.9 % IJ SOLN: 3.0000 mL | INTRAMUSCULAR | Status: DC | PRN

## 2014-01-26 MED ORDER — SODIUM CHLORIDE 0.9 % IJ SOLN: 3.0000 mL | Freq: Two times a day (BID) | INTRAMUSCULAR | Status: DC

## 2014-01-26 MED ORDER — NICOTINE 14 MG/24HR TD PT24
14.0000 mg | MEDICATED_PATCH | Freq: Every day | TRANSDERMAL | Status: DC
  Administered 2014-01-26 – 2014-02-01 (×7): 14 mg via TRANSDERMAL
  Filled 2014-01-26 (×7): qty 1

## 2014-01-26 MED ORDER — MORPHINE SULFATE 2 MG/ML IJ SOLN
2.0000 mg | INTRAMUSCULAR | Status: DC | PRN
  Administered 2014-01-28: 2 mg via INTRAVENOUS
  Filled 2014-01-26: qty 1

## 2014-01-26 MED ORDER — ONDANSETRON HCL 4 MG PO TABS: 4.0000 mg | ORAL_TABLET | Freq: Four times a day (QID) | ORAL | Status: DC | PRN

## 2014-01-26 MED ORDER — SODIUM CHLORIDE 0.9 % IV SOLN: 250.0000 mL | INTRAVENOUS | Status: DC | PRN

## 2014-01-26 MED ORDER — INFLUENZA VAC SPLIT QUAD 0.5 ML IM SUSY
0.5000 mL | PREFILLED_SYRINGE | INTRAMUSCULAR | Status: AC
  Administered 2014-01-27: 0.5 mL via INTRAMUSCULAR
  Filled 2014-01-26 (×2): qty 0.5

## 2014-01-26 MED ORDER — SODIUM CHLORIDE 0.45 % IV SOLN
INTRAVENOUS | Status: DC
  Administered 2014-01-26: 1000 mL via INTRAVENOUS
  Administered 2014-01-26 – 2014-01-31 (×11): via INTRAVENOUS

## 2014-01-26 MED ORDER — ACETAMINOPHEN 650 MG RE SUPP: 650.0000 mg | Freq: Four times a day (QID) | RECTAL | Status: DC | PRN

## 2014-01-26 MED ORDER — POLYETHYLENE GLYCOL 3350 17 G PO PACK
17.0000 g | PACK | Freq: Every day | ORAL | Status: DC | PRN
  Filled 2014-01-26: qty 1

## 2014-01-26 MED ORDER — HEPARIN SODIUM (PORCINE) 5000 UNIT/ML IJ SOLN
5000.0000 [IU] | Freq: Three times a day (TID) | INTRAMUSCULAR | Status: DC
  Administered 2014-01-26 – 2014-01-27 (×3): 5000 [IU] via SUBCUTANEOUS
  Filled 2014-01-26 (×6): qty 1

## 2014-01-26 MED ORDER — ONDANSETRON HCL 4 MG/2ML IJ SOLN: 4.0000 mg | Freq: Four times a day (QID) | INTRAMUSCULAR | Status: DC | PRN

## 2014-01-26 MED ORDER — DEXAMETHASONE SODIUM PHOSPHATE 4 MG/ML IJ SOLN
4.0000 mg | Freq: Four times a day (QID) | INTRAMUSCULAR | Status: DC
Start: 2014-01-26 — End: 2014-01-26
  Filled 2014-01-26 (×4): qty 1

## 2014-01-26 NOTE — ED Provider Notes (Signed)
Medical screening examination/treatment/procedure(s) were conducted as a shared visit with non-physician practitioner(s) and myself.  I personally evaluated the patient during the encounter. Patient is a 68 year old male with no significant past medical history. He presents today with complaints of pain in his lower back radiating to his right thigh. This started earlier today and has been constant. He denies any new activities or injury, but does report he smoked crack cocaine earlier today. He denies any bowel or bladder complaints. He denies any fever.  On exam, vitals are stable and the patient is afebrile. Head is atraumatic, normocephalic. Neck is supple. Heart is regular rate and rhythm and lungs are clear. Abdomen is soft, nontender, nondistended. His low back is tender to palpation in the soft tissues of the lumbar region.  Workup reveals no significant findings on his EKG, however his urinalysis reveals microscopic hematuria. This raises the possibility for renal calculus, and a CT scan with renal protocol was performed. Unfortunately this revealed multiple lesions in the retroperitoneum consistent with enlarged lymph nodes or potentially metastatic disease. As this patient has no primary physician and no followup, the decision has been made to pursue admission for diagnosis and initiation of treatment. He will be evaluated by the hospitalist who later arrangements for transfer to [facility].   EKG Interpretation   Date/Time:  Saturday January 26 2014 00:15:38 EDT Ventricular Rate:  75 PR Interval:  149 QRS Duration: 89 QT Interval:  413 QTC Calculation: 461 R Axis:   -78 Text Interpretation:  Sinus rhythm Multiform ventricular premature  complexes Left axis deviation Low voltage, extremity leads Consider  anterolateral infarct Baseline wander in lead(s) V5 Confirmed by Beau Fanny   MD, Wally Shevchenko ([id number]) on 01/26/2014 12:32:25 AM       Veryl Speak, MD 01/26/14 8938

## 2014-01-26 NOTE — H&P (Addendum)
[facility] History and Physical  Jeff [name] XKP:[id number] DOB: 1945/12/12 DOA: 01/25/2014  Referring physician: Dr. Gerrianne Scale - [facility] PCP: No primary provider on file.  Specialists: Dr. Alen Blew - oncology  Chief Complaint: back pain  Assessment/Plan Active Problems: Mass Drug abuse Renal insufficiency Tobacco abuse  Cancer: Massive retroperitoneal and pelvic lymphadenopathy (12.9x7.2cm) along w/ scattered metastatic bone lesions and possible lung mets on CT. Unfortunately non-contrast study. Pt has not been to a doctor since the 90s. Discussed case w/ Dr. Alen Blew who agreed to have pt seen at [facility] ED in order to ensure pts workup is expedited as there is high concern that this pt will ge lost to follow-up. Alk Phos 137 likely from bony mets.  - Admit and transfer to [facility] - med surge - Oncology to see after transfer  Renal insufficiency: Cr 1.56. No previous labs/baseline. Likely CKD w/ hydronephrosis from mass as discussed above. No previous chemistry to compare - Urology consult recommended - BMET in am - 1/2NS 145m/hr  Drug abuse: endorses smoking crack cocaine - Abuse counseling - UDS - CIWA - ETOH  Tobacco abuse: smokes 1/2ppd - Nicotine patch  DVT Prophylaxis: Hep [place] TID  Code Status: FULL Family Communication: Son not present at time of admission [facility]  HPI: Jeff [name] a 68y.[facility]. [facility]  Back pain. Started on 10/2 in the evening after smoking crack. Associated w/ nausea and emesis. Denies diarrhea, fevers, CP, SOB, palpitations, dysuria, frequency. Endorses difficulty passing urine. Last trip to doctor was in the mid 1990s.   Review of Systems: Per HPI w/ all other systems negative.   History reviewed. No pertinent past medical history. History reviewed. No pertinent past surgical history. Social History:  History   Social History Narrative  . No narrative on file    No  Known Allergies  Family History  Problem Relation Age of Onset  . Cancer Brother 60     Prior to Admission medications   Medication Sig Start Date End Date Taking? Authorizing Provider  naproxen (NAPROSYN) 500 MG tablet Take 1 tablet (500 mg total) by mouth 2 (two) times daily with a meal. 12/23/12   BJohnna Acosta MD  sulfamethoxazole-trimethoprim (SEPTRA DS) 800-160 MG per tablet Take 1 tablet by mouth 2 (two) times daily. 12/25/12   Tammy L. Triplett, PA-C  traMADol (ULTRAM) 50 MG tablet Take 1 tablet (50 mg total) by mouth every 6 (six) hours as needed for pain. 12/23/12   BJohnna Acosta MD   Physical Exam: Filed Vitals:   01/26/14 0003 01/26/14 0030 01/26/14 0100 01/26/14 0249  BP:  115/83 129/75 99/52  Pulse:  76 80 77  Temp: 97.8 F (36.6 C)     TempSrc: Oral     Resp:  29 24 33  Height:      Weight:      SpO2:  98% 96% 99%     General:  Thin and elderly, NAD  Eyes: EOMI   ENT: mmm,  Neck: FROM  Cardiovascular: faint heart sounds, RRR, no m/r/g  Respiratory: Nml WOB. No wheezes, ronchi. Good breath sounds throughout  Abdomen: NABS, nonttp  Skin: warm, well perfused, intact  GU: anal sphincter intact, large mass/prostate felt almost immediately upon DRE. Non-tender  Musculoskeletal: No LE edema, moves all extremities spontaneously   Psychiatric: nml affect  Neurologic: CN2-12 Grossly intact, cerebellar fxn nml  Labs on Admission:  Basic Metabolic Panel:  Recent Labs Lab 01/26/14 0300  NA 136*  K 4.4  CL 100  CO2 22  GLUCOSE 84  BUN 17  CREATININE 1.56*  CALCIUM 9.2   Liver Function Tests:  Recent Labs Lab 01/26/14 0300  AST 32  ALT 7  ALKPHOS 137*  BILITOT 0.3  PROT 7.4  ALBUMIN 3.6   No results found for this basename: LIPASE, AMYLASE,  in the last 168 hours No results found for this basename: AMMONIA,  in the last 168 hours CBC:  Recent Labs Lab 01/26/14 0300  WBC 6.3  NEUTROABS 4.4  HGB 10.1*  HCT 29.2*  MCV 86.6  PLT  327   Cardiac Enzymes: No results found for this basename: CKTOTAL, CKMB, CKMBINDEX, TROPONINI,  in the last 168 hours  BNP (last 3 results) No results found for this basename: PROBNP,  in the last 8760 hours CBG: No results found for this basename: GLUCAP,  in the last 168 hours  Radiological Exams on Admission: Ct Renal Stone Study  01/26/2014   CLINICAL DATA:  Right-sided flank pain, nausea, and is curious starting at 9:30 p.m. tonight. Small amount of hematuria and burning.  EXAM: CT ABDOMEN AND PELVIS WITHOUT CONTRAST  TECHNIQUE: Multidetector CT imaging of the abdomen and pelvis was performed following the standard protocol without IV contrast.  COMPARISON:  None.  FINDINGS: Emphysematous changes in the lung bases with patchy nodular ground-glass opacities, indeterminate. Small esophageal hiatal hernia.  Evaluation is limited due to noncontrast technique, but there appears to be massive retroperitoneal lymphadenopathy, extending into the pelvis all the way down to the level of the symphysis pubis. Largest cross-sectional area measures about 7.2 x 12.9 cm transverse dimension. The multifocal nodular areas of bone sclerosis demonstrated throughout the visualized spine, ribs, sacrum, pelvis, and hips. Findings likely to represent lymphoma although extensive metastatic disease could also potentially have this appearance. There is a bilateral renal hydronephrosis and hydroureter, greater on the left side. This is likely due to extrinsic compression by the prominent lymphadenopathy. The bladder is decompressed and is displaced anteriorly and laterally. Bladder wall is thickened, possibly due decompression or infection. The unenhanced appearance of the liver, spleen, gallbladder, and adrenal glands is unremarkable. Aorta, inferior vena cava, and pancreas cannot be evaluated due to lymphadenopathy.  IMPRESSION: Massive retroperitoneal and pelvic lymphadenopathy, diffuse sclerotic bone lesions, and  indeterminate small nodular changes in the lungs likely representing lymphoma but extensive metastatic disease is also possible. There is bilateral hydronephrosis caused by extrinsic compression of the ureters. Bladder is displaced. Small esophageal hiatal hernia.   Electronically Signed   By: Lucienne Capers M.D.   On: 01/26/2014 02:36       Time spent: >70 min in direct pt care and coordination   MERRELL, Salil J, MD [facility] [email or web address] Password [email or web address] 01/26/2014, 4:40 AM

## 2014-01-26 NOTE — Progress Notes (Signed)
Patient ID: Jeff [name], male   DOB: 11-09-1945, 68 y.o.   MRN: [id number] [facility] PROGRESS NOTE  Jeff [name] FUX:[id number] DOB: 12/17/1945 DOA: 01/25/2014 PCP: No primary provider on file.  Brief narrative: 68 year old male with past medical history of drug abuse, cocaine abuse, smoker. He presented to [facility] 01/25/2014 with complaints of back pain. He admitted to smoking crack cocaine as well as associated nausea and vomiting. Patient also reported difficulty urination. On admission, patient was found to have massive retroperitoneal and pelvic lymphadenopathy, diffuse sclerotic bone lesions and indeterminate small nodular changes in the lungs likely representing lymphoma but extensive metastatic disease is also possible; additionally bilateral hydronephrosis caused by extrinsic compression of the ureters, displaced bladder.  Assessment/Plan:    Principal Problem: Back pain in the setting of sclerotic bone lesions / possible lymphoma, metastatic disease / small nodular changes in lung  Patient did not have complaints of back pain this am. His pain regimen at this time includes morphine 2 mg IV every 4 hours PRN severe pain.  Considering findings of bilateral hydronephrosis on CT scan, high suspicion for prostate cancer. Will obtain PSA, urology consult.  No complaints of LE weakness, neurologic deficits. No sensation loss.  Order placed for CT chest without contrast for further evaluation of possible lung ca.  Active Problems: Bilateral hydronephrosis / extrinsic compression of the ureters  Bilateral hydronephrosis / extrinsic compression of the ureters along with patient's complaints of difficulty urination is highly suspicious for prostate ca.  Order placed for PSA, urology consulted.  High alkaline phosphatase  Likely due to bone resorption. No evidence of liver metastases on CT scan.  Acute renal failure  Possibly even chronic but no previous values for  comparison.  Continue IV fluids.  Follow up BMP in am. History of drug abuse / smoker   Will counsel once patient more alert.  Nicotine patch ordered.   DVT Prophylaxis   Heparin subQ ordered.  Code Status: Full.  Family Communication:  Family not at the bedside this am Disposition Plan: Home when stable.    IV Access:   Peripheral IV Procedures and diagnostic studies:    Ct Renal Stone Study 01/30/14   Massive retroperitoneal and pelvic lymphadenopathy, diffuse sclerotic bone lesions, and indeterminate small nodular changes in the lungs likely representing lymphoma but extensive metastatic disease is also possible. There is bilateral hydronephrosis caused by extrinsic compression of the ureters. Bladder is displaced. Small esophageal hiatal hernia.   Medical Consultants:   Oncology  Other Consultants:   Physical therapy  Anti-Infectives:   None    Leisa Lenz, MD  [facility] Pager [phone]  If 7PM-7AM, please contact night-coverage [email or web address] Password [email or web address] Jan 30, 2014, 7:07 AM   LOS: 1 day    HPI/Subjective: No acute overnight events.  Objective: Filed Vitals:   January 30, 2014 0100 January 30, 2014 0249 Jan 30, 2014 0500 01-30-2014 0611  BP: 129/75 99/52 102/60 107/72  Pulse: 80 77  73  Temp:    97.4 F (36.3 C)  TempSrc:    Oral  Resp: 24 33 25 22  Height:    6' (1.829 m)  Weight:    68.5 kg (151 lb 0.2 oz)  SpO2: 96% 99%  98%    Intake/Output Summary (Last 24 hours) at 2014/01/30 0707 Last data filed at 01-30-14 0657  Gross per 24 hour  Intake 201.67 ml  Output      0 ml  Net 201.67 ml    Exam:   General:  Pt is sleeping, no  distress  Cardiovascular: Regular rate and rhythm, S1/S2, no murmurs  Respiratory: Clear to auscultation bilaterally, no wheezing, no crackles, no rhonchi  Abdomen: Soft, non tender, non distended, bowel sounds present  Extremities: No edema, pulses DP and PT palpable bilaterally  Neuro: Grossly nonfocal  Data  Reviewed: Basic Metabolic Panel:  Recent Labs Lab 01/26/14 0300  NA 136*  K 4.4  CL 100  CO2 22  GLUCOSE 84  BUN 17  CREATININE 1.56*  CALCIUM 9.2   Liver Function Tests:  Recent Labs Lab 01/26/14 0300  AST 32  ALT 7  ALKPHOS 137*  BILITOT 0.3  PROT 7.4  ALBUMIN 3.6   No results found for this basename: LIPASE, AMYLASE,  in the last 168 hours No results found for this basename: AMMONIA,  in the last 168 hours CBC:  Recent Labs Lab 01/26/14 0300  WBC 6.3  NEUTROABS 4.4  HGB 10.1*  HCT 29.2*  MCV 86.6  PLT 327   Cardiac Enzymes: No results found for this basename: CKTOTAL, CKMB, CKMBINDEX, TROPONINI,  in the last 168 hours BNP: No components found with this basename: POCBNP,  CBG: No results found for this basename: GLUCAP,  in the last 168 hours  No results found for this or any previous visit (from the past 240 hour(s)).   Scheduled Meds: . heparin  5,000 Units Subcutaneous 3 times per day  . nicotine  14 mg Transdermal Daily  . senna  1 tablet Oral BID  . sodium chloride  3 mL Intravenous Q12H   Continuous Infusions: . sodium chloride 100 mL/hr at 01/26/14 [phone]

## 2014-01-27 DIAGNOSIS — N179 Acute kidney failure, unspecified: Secondary | ICD-10-CM

## 2014-01-27 DIAGNOSIS — F1410 Cocaine abuse, uncomplicated: Secondary | ICD-10-CM

## 2014-01-27 DIAGNOSIS — N1330 Unspecified hydronephrosis: Secondary | ICD-10-CM

## 2014-01-27 DIAGNOSIS — M899 Disorder of bone, unspecified: Secondary | ICD-10-CM

## 2014-01-27 DIAGNOSIS — C61 Malignant neoplasm of prostate: Secondary | ICD-10-CM | POA: Diagnosis not present

## 2014-01-27 LAB — BASIC METABOLIC PANEL
Anion gap: 12 (ref 5–15)
BUN: 21 mg/dL (ref 6–23)
CO2: 23 mEq/L (ref 19–32)
Calcium: 8.9 mg/dL (ref 8.4–10.5)
Chloride: 101 mEq/L (ref 96–112)
Creatinine, Ser: 1.76 mg/dL — ABNORMAL HIGH (ref 0.50–1.35)
GFR calc Af Amer: 44 mL/min — ABNORMAL LOW (ref >90)
GFR, EST NON AFRICAN AMERICAN: 38 mL/min — AB (ref >90)
Glucose, Bld: 106 mg/dL — ABNORMAL HIGH (ref 70–99)
POTASSIUM: 4.5 meq/L (ref 3.7–5.3)
Sodium: 136 mEq/L — ABNORMAL LOW (ref 137–147)

## 2014-01-27 LAB — OCCULT BLOOD, POC DEVICE: Fecal Occult Bld: NEGATIVE

## 2014-01-27 LAB — GLUCOSE, CAPILLARY: GLUCOSE-CAPILLARY: 85 mg/dL (ref 70–99)

## 2014-01-27 MED ORDER — CEFAZOLIN SODIUM-DEXTROSE 2-3 GM-% IV SOLR
2.0000 g | INTRAVENOUS | Status: AC
  Administered 2014-01-28: 2 g via INTRAVENOUS
  Filled 2014-01-27: qty 50

## 2014-01-27 MED ORDER — HEPARIN SODIUM (PORCINE) 5000 UNIT/ML IJ SOLN
5000.0000 [IU] | Freq: Three times a day (TID) | INTRAMUSCULAR | Status: DC
  Administered 2014-01-27 – 2014-02-01 (×12): 5000 [IU] via SUBCUTANEOUS
  Filled 2014-01-27 (×18): qty 1

## 2014-01-27 NOTE — Consult Note (Signed)
Patient seen and examined. I could not palpate and abd or SP mass. I reviewed notes, labs and CT iamges. I discussed patient with Dr. Wynetta Emery - I agree with his assessment and plan. I discussed CT findings with patient and the nature, r/b alternatives to bilateral percutaneous nephrostomy tubes including continued observation.  His creatinine has trended up since he has been here and I suspect he is going to need the nephrostomy tubes given the large size of the mass. He elects to proceed.

## 2014-01-27 NOTE — Consult Note (Signed)
Reason for consult: bilateral percutaneous nephrostomies, retroperitoneal mass biopsy  Referring Physician(s): Dr. Neil Crouch  History of Present Illness: Jeff [name] is a 68 y.o. male with no significant PMH other than smoking/substance abuse (recent cocaine use)who presented to [facility] 01/25/2014 with complaints of back pain and difficulty with urination. On admission, patient was found to have massive retroperitoneal and pelvic lymphadenopathy, diffuse sclerotic bone lesions and indeterminate small nodular changes in the lungs likely representing lymphoma but extensive metastatic disease is also possible; additionally bilateral hydronephrosis caused by extrinsic compression of the ureters, acute renal failure and displaced bladder. Upon further questioning pt also reports weight loss, some dyspnea with exertion, occ cough,  night sweats, occ headaches, left abdominal soreness, recent swelling of left leg and occ parethesias of right hand but no sig CP, hematuria or blood in stool. Pt denies hx of DM,HTN,CAD, lung disease, or cancer.Request is now received for placement of bilateral percutaneous nephrostomies as well as CT guided RP/nodal mass biopsy.   History reviewed. No pertinent past medical history.  History reviewed. No pertinent past surgical history.  Allergies: Review of patient's allergies indicates no known allergies.  Medications: Prior to Admission medications   Not on File    Family History  Problem Relation Age of Onset  . Cancer Brother 12    History   Social History  . Marital Status: Single    Spouse Name: N/A    Number of Children: N/A  . Years of Education: N/A   Social History Main Topics  . Smoking status: Current Every Day Smoker -- 0.50 packs/day    Types: Cigarettes  . Smokeless tobacco: None  . Alcohol Use: Yes     Comment: occassional 2-3x week  . Drug Use: Yes    Special: Cocaine  . Sexual Activity: Yes   Other Topics Concern  . None    Social History Narrative  . None         Review of Systems  see H&P  Vital Signs: BP 103/65  Pulse 76  Temp(Src) 98.1 F (36.7 C) (Oral)  Resp 16  Ht 6' (1.829 m)  Wt 151 lb 0.2 oz (68.5 kg)  BMI 20.48 kg/m2  SpO2 98%  Physical Exam pt awake/alert; chest- few scatt rhonchi; heart- RRR; abd- +BS, palpable mid abd mass (nodal) with mild tenderness to mid left abd region; ext- FROM,no sig edema  Imaging: Ct Chest Wo Contrast  01/26/2014   CLINICAL DATA:  Nodules demonstrated in the lung bases on abdominal CT scan of earlier today. The patient has known massive intraperitoneal and retroperitoneal lymphadenopathy and sclerotic bone lesions  EXAM: CT CHEST WITHOUT CONTRAST  TECHNIQUE: Multidetector CT imaging of the chest was performed following the standard protocol without IV contrast.  COMPARISON:  Abdominal pelvic CT scan of today's date  FINDINGS: The cardiac chambers are normal. The caliber of the thoracic aorta is normal. There is no mediastinal nor hilar lymphadenopathy. There is no pleural nor pericardial effusion.  At lung window settings there are emphysematous changes bilaterally. There are subtle areas of ill-defined nodularity in both lungs. There are a few small patchy subpleural and central foci of interstitial density which are nonspecific. There are no air bronchograms. There is no bronchiectasis.  There are sclerotic lesions throughout the thoracic spine as well as within multiple ribs and in the scapulae and in the left humeral head. There is a well-defined subcentimeter soft tissue density nodule just above the dome of the left hemidiaphragm. There  is no interstitial or alveolar infiltrate.  IMPRESSION: 1. There are a few scattered subpleural and more central foci of increased interstitial density which are nonspecific but could reflect pneumonitis. Subtle subpleural subcentimeter nodules are nonspecific and most likely inflammatory. There is no malignant-appearing  parenchymal mass. There is no mediastinal nor hilar lymphadenopathy. There is no pleural nor pericardial effusion. 2. There are widespread sclerotic lesions of the bone consistent with metastatic disease.   Electronically Signed   By: Draeden  Martinique   On: 01/26/2014 14:49   Ct Renal Stone Study  01/26/2014   CLINICAL DATA:  Right-sided flank pain, nausea, and is curious starting at 9:30 p.m. tonight. Small amount of hematuria and burning.  EXAM: CT ABDOMEN AND PELVIS WITHOUT CONTRAST  TECHNIQUE: Multidetector CT imaging of the abdomen and pelvis was performed following the standard protocol without IV contrast.  COMPARISON:  None.  FINDINGS: Emphysematous changes in the lung bases with patchy nodular ground-glass opacities, indeterminate. Small esophageal hiatal hernia.  Evaluation is limited due to noncontrast technique, but there appears to be massive retroperitoneal lymphadenopathy, extending into the pelvis all the way down to the level of the symphysis pubis. Largest cross-sectional area measures about 7.2 x 12.9 cm transverse dimension. The multifocal nodular areas of bone sclerosis demonstrated throughout the visualized spine, ribs, sacrum, pelvis, and hips. Findings likely to represent lymphoma although extensive metastatic disease could also potentially have this appearance. There is a bilateral renal hydronephrosis and hydroureter, greater on the left side. This is likely due to extrinsic compression by the prominent lymphadenopathy. The bladder is decompressed and is displaced anteriorly and laterally. Bladder wall is thickened, possibly due decompression or infection. The unenhanced appearance of the liver, spleen, gallbladder, and adrenal glands is unremarkable. Aorta, inferior vena cava, and pancreas cannot be evaluated due to lymphadenopathy.  IMPRESSION: Massive retroperitoneal and pelvic lymphadenopathy, diffuse sclerotic bone lesions, and indeterminate small nodular changes in the lungs likely  representing lymphoma but extensive metastatic disease is also possible. There is bilateral hydronephrosis caused by extrinsic compression of the ureters. Bladder is displaced. Small esophageal hiatal hernia.   Electronically Signed   By: Lucienne Capers M.D.   On: 01/26/2014 02:36    Labs:  CBC:  Recent Labs  01/26/14 0300  WBC 6.3  HGB 10.1*  HCT 29.2*  PLT 327    COAGS: No results found for this basename: INR, APTT,  in the last 8760 hours  BMP:  Recent Labs  01/26/14 0300  NA 136*  K 4.4  CL 100  CO2 22  GLUCOSE 84  BUN 17  CALCIUM 9.2  CREATININE 1.56*  GFRNONAA 44*  GFRAA 51*    LIVER FUNCTION TESTS:  Recent Labs  01/26/14 0300  BILITOT 0.3  AST 32  ALT 7  ALKPHOS 137*  PROT 7.4  ALBUMIN 3.6    TUMOR MARKERS: No results found for this basename: AFPTM, CEA, CA199, CHROMGRNA,  in the last 8760 hours  Assessment and Plan:  Jeff [name] is a 68 y.o. male with no significant PMH other than smoking/substance abuse (recent cocaine use)who presented to [facility] 01/25/2014 with complaints of back pain and difficulty with urination. On admission, patient was found to have massive retroperitoneal and pelvic lymphadenopathy, diffuse sclerotic bone lesions and indeterminate small nodular changes in the lungs likely representing lymphoma but extensive metastatic disease is also possible; additionally bilateral hydronephrosis caused by extrinsic compression of the ureters, acute renal failure and displaced bladder. Upon further questioning pt also reports weight loss,  some dyspnea with exertion, occ cough night sweats, occ headaches, left abdominal soreness, recent swelling of left leg and occ parethesias of right hand but no sig CP,hematuria,or blood in stool . Pt denies hx of DM,HTN,CAD, lung disease, or cancer.Request is now received for placement of bilateral percutaneous nephrostomies as well as CT guided RP/nodal mass biopsy. Imaging studies have been reviewed by Dr.  Vernard Gambles. Details/risks of both procedures d/w pt with his understanding and consent. Will tent plan cases for 10/5 pending further evaluation by urology. Will most likely need oncology consult as well.         I spent a total of 20 minutes face to face in clinical consultation, greater than 50% of which was counseling/coordinating care for bilateral percutaneous nephrostomies/RP mass biopsy.  Signed: Autumn Messing 01/27/2014, 9:01 AM

## 2014-01-27 NOTE — Progress Notes (Signed)
PT Cancellation Note  Patient Details Name: Jeff [name] MRN: [id number] DOB: March 03, 1946   Cancelled Treatment:     PT eval attempted.  Pt states he is ambulating without difficulty in room and functioning at PLOF.  Pt declines PT services at this time.     Ariela Mochizuki 01/27/2014, 11:44 AM

## 2014-01-27 NOTE — Progress Notes (Addendum)
Patient ID: Jeff [name], male   DOB: 1946-03-05, 68 y.o.   MRN: [id number] [facility] PROGRESS NOTE  Endrit Gittins BCW:[id number] DOB: [birth year] DOA: 01/25/2014 PCP: No primary provider on file.  Brief narrative: 68 year old male with past medical history of drug abuse, cocaine abuse, smoker. He presented to [facility] 01/25/2014 with complaints of back pain. He admitted to smoking crack cocaine as well as associated nausea and vomiting. Patient also reported difficulty urination. On admission, patient was found to have massive retroperitoneal and pelvic lymphadenopathy, diffuse sclerotic bone lesions and indeterminate small nodular changes in the lungs likely representing lymphoma but extensive metastatic disease is also possible; additionally bilateral hydronephrosis caused by extrinsic compression of the ureters, displaced bladder. PSA pending.   Assessment/Plan:   Principal Problem:  Back pain in the setting of sclerotic bone lesions / possible lymphoma, metastatic disease / small nodular changes in lung  Patient has no complaints of back today. No neurologic deficits. Considering findings of bilateral hydronephrosis on CT scan, high suspicion for prostate cancer. PSA is pending. Spoke with urology on call; they recommended IR for percutaneous nephrostomy tube placement. They will also see the patient in consultation. CT chest showed few scattered subpleural and more central foci of increased interstitial density which is nonspecific but could reflect pneumonitis. No evidence of metastases in lungs.  Active Problems:  Bilateral hydronephrosis / extrinsic compression of the ureters  Bilateral hydronephrosis / extrinsic compression of the ureters along with patient's complaints of occasional difficulty urination is suspicious for prostate ca.  PSA is pending. Urology consulted. Order placed for IR for possible retroperitoneal lymph node biopsy. High alkaline phosphatase  Likely due to bone  resorption. No evidence of liver metastases on CT scan.  Acute renal failure  Possibly even chronic but no previous values for comparison.  Continue IV fluids. BMP this am pending. History of drug abuse / smoker  Counseled on cessation.   Nicotine patch ordered. DVT Prophylaxis  Heparin subQ ordered.   Code Status: Full.  Family Communication: updated the patient at the bedside  Disposition Plan: Home when stable.   IV Access:   Peripheral IV Procedures and diagnostic studies:   Ct Renal Stone Study 01/26/2014 Massive retroperitoneal and pelvic lymphadenopathy, diffuse sclerotic bone lesions, and indeterminate small nodular changes in the lungs likely representing lymphoma but extensive metastatic disease is also possible. There is bilateral hydronephrosis caused by extrinsic compression of the ureters. Bladder is displaced. Small esophageal hiatal hernia.  CT chest without the contrast 01/26/2014: There are a few scattered subpleural and more central foci of increased interstitial density which are nonspecific but could reflect pneumonitis. Subtle subpleural subcentimeter nodules are nonspecific and most likely inflammatory. There is no malignant-appearing parenchymal mass. There is no mediastinal nor hilar lymphadenopathy. There is no pleural nor pericardial effusion. 2. There are widespread sclerotic lesions of the bone consistent with metastatic disease. Medical Consultants:   Oncology  IR Urology Other Consultants:   Physical therapy  Anti-Infectives:   None    Leisa Lenz, MD  [facility] Pager [phone]  If 7PM-7AM, please contact night-coverage [email or web address] Password [email or web address] 01/27/2014, 11:04 AM   LOS: 2 days    HPI/Subjective: No acute overnight events.  Objective: Filed Vitals:   01/26/14 0611 01/26/14 1415 01/26/14 2157 01/27/14 0438  BP: 107/72 104/67 113/65 103/65  Pulse: 73 84 80 76  Temp: 97.4 F (36.3 C) 98.4 F (36.9 C) 98.4 F (36.9 C) 98.1 F  (36.7 C)  TempSrc: Oral Oral Oral  Oral  Resp: 22 16 16 16   Height: 6' (1.829 m)     Weight: 68.5 kg (151 lb 0.2 oz)     SpO2: 98% 99% 97% 98%    Intake/Output Summary (Last 24 hours) at 01/27/14 1104 Last data filed at 01/27/14 0900  Gross per 24 hour  Intake   3965 ml  Output      0 ml  Net   3965 ml    Exam:   General:  Pt is alert, follows commands appropriately, not in acute distress  Cardiovascular: Regular rate and rhythm, S1/S2, no murmurs  Respiratory: Clear to auscultation bilaterally, no wheezing, no crackles, no rhonchi  Abdomen: Soft, distended in lower abdomen, non tendr, bowel sounds present  Extremities: No edema, pulses DP and PT palpable bilaterally  Neuro: Grossly nonfocal  Data Reviewed: Basic Metabolic Panel:  Recent Labs Lab 01/26/14 0300  NA 136*  K 4.4  CL 100  CO2 22  GLUCOSE 84  BUN 17  CREATININE 1.56*  CALCIUM 9.2   Liver Function Tests:  Recent Labs Lab 01/26/14 0300  AST 32  ALT 7  ALKPHOS 137*  BILITOT 0.3  PROT 7.4  ALBUMIN 3.6   No results found for this basename: LIPASE, AMYLASE,  in the last 168 hours No results found for this basename: AMMONIA,  in the last 168 hours CBC:  Recent Labs Lab 01/26/14 0300  WBC 6.3  NEUTROABS 4.4  HGB 10.1*  HCT 29.2*  MCV 86.6  PLT 327   Cardiac Enzymes: No results found for this basename: CKTOTAL, CKMB, CKMBINDEX, TROPONINI,  in the last 168 hours BNP: No components found with this basename: POCBNP,  CBG:  Recent Labs Lab 01/27/14 0732  GLUCAP 85    No results found for this or any previous visit (from the past 240 hour(s)).   Scheduled Meds: . heparin  5,000 Units Subcutaneous 3 times per day  . nicotine  14 mg Transdermal Daily  . senna  1 tablet Oral BID   Continuous Infusions: . sodium chloride 100 mL/hr at 01/27/14 0230

## 2014-01-27 NOTE — Progress Notes (Signed)
Nutrition Brief Note  Patient identified on the Malnutrition Screening Tool (MST) Report  Wt Readings from Last 15 Encounters:  01/26/14 151 lb 0.2 oz (68.5 kg)  12/25/12 153 lb (69.4 kg)  12/23/12 153 lb 4 oz (69.514 kg)    Body mass index is 20.48 kg/(m^2). Patient meets criteria for normal weight based on current BMI.   Current diet order is Regular, patient is consuming approximately 100% of meals at this time. Labs and medications reviewed.   Patient with good appetite and minimal weight loss reported.   No nutrition interventions warranted at this time. If nutrition issues arise, please consult RD.   Larey Seat, RD, LDN Pager #: [phone] After-Hours Pager #: [phone]

## 2014-01-27 NOTE — Consult Note (Signed)
Urology Consult   Physician requesting consult: Dr. Leisa Lenz, Internal Medicine  Reason for consult: Bilateral hydronephrosis  History of Present Illness: Jeff [name] is a 68 y.o. male with history of drug abuse (crack cocaine) with minimal medical care who presented with back pain, nausea/vomiting, and difficulty urinating on 10/2. CT scan revealed massive bilateral retroperitoneal lymphadenopathy, sclerotic bone lesions, and small nodular changes in lungs concerning for lymphoma or metastatic disease. Urology consulted today for findings of bilateral hydronephrosis and possible metastatic prostate cancer.   Denies a history of voiding or storage urinary symptoms prior to 1 month ago. He started having frequent urination, difficulty initiating his stream, and nocturia x 3-4. He also noted 1 episode of hematuria a couple months ago but urine has otherwise been clear. Denies UTIs, STDs, urolithiasis, GU malignancy/trauma/surgery. He reports recent weight loss, headaches, night sweats, abdominal pain and extremity swelling/neuropathic changes.    PMH: Drug abuse Tobacco abuse  History reviewed. No pertinent past surgical history.  Medications:  Home meds:    Medication List    ASK your doctor about these medications       naproxen 500 MG tablet  Commonly known as:  NAPROSYN  Take 1 tablet (500 mg total) by mouth 2 (two) times daily with a meal.     sulfamethoxazole-trimethoprim 800-160 MG per tablet  Commonly known as:  SEPTRA DS  Take 1 tablet by mouth 2 (two) times daily.     traMADol 50 MG tablet  Commonly known as:  ULTRAM  Take 1 tablet (50 mg total) by mouth every 6 (six) hours as needed for pain.        Scheduled Meds: . heparin  5,000 Units Subcutaneous 3 times per day  . Influenza vac split quadrivalent PF  0.5 mL Intramuscular Tomorrow-1000  . nicotine  14 mg Transdermal Daily  . senna  1 tablet Oral BID   Continuous Infusions: . sodium chloride 100 mL/hr  at 01/27/14 0230   PRN Meds:.acetaminophen, acetaminophen, morphine injection, ondansetron (ZOFRAN) IV, ondansetron, polyethylene glycol  Allergies: No Known Allergies  Family History  Problem Relation Age of Onset  . Cancer Brother 64    Social History:  reports that he has been smoking Cigarettes.  He has been smoking about 0.50 packs per day. He does not have any smokeless tobacco history on file. He reports that he drinks alcohol. He reports that he uses illicit drugs (Cocaine).  ROS: A complete review of systems was performed.  All systems are negative except for pertinent findings as noted.  Physical Exam:  Vital signs in last 24 hours: Temp:  [98.1 F (36.7 C)-98.4 F (36.9 C)] 98.1 F (36.7 C) (10/04 0438) Pulse Rate:  [76-84] 76 (10/04 0438) Resp:  [16] 16 (10/04 0438) BP: (103-113)/(65-67) 103/65 mmHg (10/04 0438) SpO2:  [97 %-99 %] 98 % (10/04 0438) Constitutional:  Alert and oriented, No acute distress Cardiovascular: Regular rate and rhythm, No JVD Respiratory: Normal respiratory effort, Lungs clear bilaterally GI: Abdomen is soft, nontender, nondistended, no abdominal masses Genitourinary: No CVAT. Normal male phallus, testes are descended bilaterally and non-tender and without masses, scrotum is normal in appearance without lesions or masses, perineum is normal on inspection. Rectal: Normal sphincter tone. Prostate is markedly enlarged, firm, nodular, and asymmetric with right side > Left side Neurologic: Grossly intact, no focal deficits Psychiatric: Normal mood and affect  Laboratory Data:   Recent Labs  01/26/14 0300  WBC 6.3  HGB 10.1*  HCT 29.2*  PLT 327  Recent Labs  01/26/14 0300  NA 136*  K 4.4  CL 100  GLUCOSE 84  BUN 17  CALCIUM 9.2  CREATININE 1.56*     Results for orders placed during the hospital encounter of 01/25/14 (from the past 24 hour(s))  HIV ANTIBODY (ROUTINE TESTING)     Status: None   Collection Time    01/26/14  12:23 PM      Result Value Ref Range   HIV 1&2 Ab, 4th Generation NONREACTIVE  NONREACTIVE  GLUCOSE, CAPILLARY     Status: None   Collection Time    01/27/14  7:32 AM      Result Value Ref Range   Glucose-Capillary 85  70 - 99 mg/dL   Comment 1 Documented in Chart     Comment 2 Notify RN     No results found for this or any previous visit (from the past 240 hour(s)).  Urinalysis    Component Value Date/Time   COLORURINE YELLOW 01/25/2014 2331   APPEARANCEUR CLEAR 01/25/2014 2331   LABSPEC 1.015 01/25/2014 2331   PHURINE 5.5 01/25/2014 2331   GLUCOSEU NEGATIVE 01/25/2014 2331   HGBUR LARGE* 01/25/2014 2331   BILIRUBINUR NEGATIVE 01/25/2014 2331   KETONESUR NEGATIVE 01/25/2014 2331   PROTEINUR 100* 01/25/2014 2331   UROBILINOGEN 0.2 01/25/2014 2331   NITRITE NEGATIVE 01/25/2014 2331   LEUKOCYTESUR NEGATIVE 01/25/2014 2331     Renal Function:  Recent Labs  01/26/14 0300  CREATININE 1.56*   Estimated Creatinine Clearance: 44.5 ml/min (by C-G formula based on Cr of 1.56).  Radiologic Imaging: Ct Chest Wo Contrast  01/26/2014   CLINICAL DATA:  Nodules demonstrated in the lung bases on abdominal CT scan of earlier today. The patient has known massive intraperitoneal and retroperitoneal lymphadenopathy and sclerotic bone lesions  EXAM: CT CHEST WITHOUT CONTRAST  TECHNIQUE: Multidetector CT imaging of the chest was performed following the standard protocol without IV contrast.  COMPARISON:  Abdominal pelvic CT scan of today's date  FINDINGS: The cardiac chambers are normal. The caliber of the thoracic aorta is normal. There is no mediastinal nor hilar lymphadenopathy. There is no pleural nor pericardial effusion.  At lung window settings there are emphysematous changes bilaterally. There are subtle areas of ill-defined nodularity in both lungs. There are a few small patchy subpleural and central foci of interstitial density which are nonspecific. There are no air bronchograms. There is no  bronchiectasis.  There are sclerotic lesions throughout the thoracic spine as well as within multiple ribs and in the scapulae and in the left humeral head. There is a well-defined subcentimeter soft tissue density nodule just above the dome of the left hemidiaphragm. There is no interstitial or alveolar infiltrate.  IMPRESSION: 1. There are a few scattered subpleural and more central foci of increased interstitial density which are nonspecific but could reflect pneumonitis. Subtle subpleural subcentimeter nodules are nonspecific and most likely inflammatory. There is no malignant-appearing parenchymal mass. There is no mediastinal nor hilar lymphadenopathy. There is no pleural nor pericardial effusion. 2. There are widespread sclerotic lesions of the bone consistent with metastatic disease.   Electronically Signed   By: Xylon  Martinique   On: 01/26/2014 14:49   Ct Renal Stone Study  01/26/2014   CLINICAL DATA:  Right-sided flank pain, nausea, and is curious starting at 9:30 p.m. tonight. Small amount of hematuria and burning.  EXAM: CT ABDOMEN AND PELVIS WITHOUT CONTRAST  TECHNIQUE: Multidetector CT imaging of the abdomen and pelvis was performed following the standard  protocol without IV contrast.  COMPARISON:  None.  FINDINGS: Emphysematous changes in the lung bases with patchy nodular ground-glass opacities, indeterminate. Small esophageal hiatal hernia.  Evaluation is limited due to noncontrast technique, but there appears to be massive retroperitoneal lymphadenopathy, extending into the pelvis all the way down to the level of the symphysis pubis. Largest cross-sectional area measures about 7.2 x 12.9 cm transverse dimension. The multifocal nodular areas of bone sclerosis demonstrated throughout the visualized spine, ribs, sacrum, pelvis, and hips. Findings likely to represent lymphoma although extensive metastatic disease could also potentially have this appearance. There is a bilateral renal hydronephrosis  and hydroureter, greater on the left side. This is likely due to extrinsic compression by the prominent lymphadenopathy. The bladder is decompressed and is displaced anteriorly and laterally. Bladder wall is thickened, possibly due decompression or infection. The unenhanced appearance of the liver, spleen, gallbladder, and adrenal glands is unremarkable. Aorta, inferior vena cava, and pancreas cannot be evaluated due to lymphadenopathy.  IMPRESSION: Massive retroperitoneal and pelvic lymphadenopathy, diffuse sclerotic bone lesions, and indeterminate small nodular changes in the lungs likely representing lymphoma but extensive metastatic disease is also possible. There is bilateral hydronephrosis caused by extrinsic compression of the ureters. Bladder is displaced. Small esophageal hiatal hernia.   Electronically Signed   By: Lucienne Capers M.D.   On: 01/26/2014 02:36    I independently reviewed the above imaging studies.  Impression/Recommendation 75M with no prior medical care who presents with symptomatic bilateral retroperitoneal lymphadenopathy causing bilateral extrinsic compression of his ureters, bilateral hydronephrosis and renal failure (unknown whether acute or chronic). He also has sclerotic bone lesions and lung lesions. His diagnosis is still unclear (prostate cancer vs lymphoma vs other) although rectal exam is very concerning for prostate cancer. PSA is pending. Suspect urinary symptoms is due to mass effect of compression on bladder.   -- recommend bilateral PCNs as indwelling ureteral stents are unlikely to be successful in the long term -- biopsy of RP masses at the same time would be helpful as well -- follow up PSA  Thank your for this consult, will continue to follow.

## 2014-01-28 ENCOUNTER — Inpatient Hospital Stay (HOSPITAL_COMMUNITY): Payer: Medicare Other

## 2014-01-28 DIAGNOSIS — D638 Anemia in other chronic diseases classified elsewhere: Secondary | ICD-10-CM

## 2014-01-28 LAB — BASIC METABOLIC PANEL
Anion gap: 12 (ref 5–15)
BUN: 21 mg/dL (ref 6–23)
CO2: 22 mEq/L (ref 19–32)
Calcium: 8.8 mg/dL (ref 8.4–10.5)
Chloride: 103 mEq/L (ref 96–112)
Creatinine, Ser: 1.83 mg/dL — ABNORMAL HIGH (ref 0.50–1.35)
GFR calc Af Amer: 42 mL/min — ABNORMAL LOW (ref >90)
GFR calc non Af Amer: 37 mL/min — ABNORMAL LOW (ref >90)
GLUCOSE: 96 mg/dL (ref 70–99)
POTASSIUM: 4.6 meq/L (ref 3.7–5.3)
SODIUM: 137 meq/L (ref 137–147)

## 2014-01-28 LAB — CBC
HCT: 25.3 % — ABNORMAL LOW (ref 39.0–52.0)
HEMOGLOBIN: 8.6 g/dL — AB (ref 13.0–17.0)
MCH: 30.1 pg (ref 26.0–34.0)
MCHC: 34 g/dL (ref 30.0–36.0)
MCV: 88.5 fL (ref 78.0–100.0)
PLATELETS: 285 10*3/uL (ref 150–400)
RBC: 2.86 MIL/uL — ABNORMAL LOW (ref 4.22–5.81)
RDW: 15.2 % (ref 11.5–15.5)
WBC: 6.5 10*3/uL (ref 4.0–10.5)

## 2014-01-28 LAB — PROTIME-INR
INR: 1.19 (ref 0.00–1.49)
Prothrombin Time: 15.2 seconds (ref 11.6–15.2)

## 2014-01-28 LAB — GLUCOSE, CAPILLARY: GLUCOSE-CAPILLARY: 82 mg/dL (ref 70–99)

## 2014-01-28 LAB — APTT: aPTT: 35 seconds (ref 24–37)

## 2014-01-28 MED ORDER — MIDAZOLAM HCL 2 MG/2ML IJ SOLN
INTRAMUSCULAR | Status: AC | PRN
  Administered 2014-01-28 (×4): 1 mg via INTRAVENOUS

## 2014-01-28 MED ORDER — IOHEXOL 300 MG/ML  SOLN
25.0000 mL | Freq: Once | INTRAMUSCULAR | Status: AC | PRN
  Administered 2014-01-28: 25 mL

## 2014-01-28 MED ORDER — CEFAZOLIN SODIUM-DEXTROSE 2-3 GM-% IV SOLR
INTRAVENOUS | Status: AC
  Filled 2014-01-28: qty 50

## 2014-01-28 MED ORDER — MIDAZOLAM HCL 2 MG/2ML IJ SOLN
INTRAMUSCULAR | Status: AC
  Filled 2014-01-28: qty 6

## 2014-01-28 MED ORDER — FENTANYL CITRATE 0.05 MG/ML IJ SOLN
INTRAMUSCULAR | Status: AC
  Filled 2014-01-28: qty 6

## 2014-01-28 MED ORDER — LIDOCAINE-EPINEPHRINE (PF) 2 %-1:200000 IJ SOLN
INTRAMUSCULAR | Status: AC
  Filled 2014-01-28: qty 40

## 2014-01-28 MED ORDER — FENTANYL CITRATE 0.05 MG/ML IJ SOLN
INTRAMUSCULAR | Status: AC | PRN
  Administered 2014-01-28 (×4): 50 ug via INTRAVENOUS

## 2014-01-28 NOTE — Progress Notes (Signed)
CSW received referral for current substance abuse.   CSW visited pt room however pt sleeping soundly and no family present at bedside.  Per chart, pt had biopsy of right sided pelvic mass this morning.  CSW to follow up at a later time to assess for current substance abuse.  CSW to continue to follow.  Alison Murray, MSW, [place] [facility] [phone]

## 2014-01-28 NOTE — Procedures (Signed)
Technically successful US guided biopsy of right sided pelvic mass.  No immediate complications.

## 2014-01-28 NOTE — Sedation Documentation (Signed)
Pt repositioned from spine for retroperitoneal mass biopsy to prone for bilateral PCN placement.

## 2014-01-28 NOTE — Progress Notes (Signed)
Patient ID: Jeff [name], male   DOB: 1945-12-19, 68 y.o.   MRN: [id number] [facility] PROGRESS NOTE  Jeff [name] HDQ:[id number] DOB: [birth year] DOA: 01/25/2014 PCP: No primary provider on file.  Brief narrative: 68 year old male with past medical history of drug abuse, cocaine abuse, smoker. He presented to [facility] 01/25/2014 with complaints of back pain. He admitted to smoking crack cocaine as well as associated nausea and vomiting. Patient also reported difficulty urination. On admission, patient was found to have massive retroperitoneal and pelvic lymphadenopathy, diffuse sclerotic bone lesions and indeterminate small nodular changes in the lungs likely representing lymphoma but extensive metastatic disease is also possible; additionally bilateral hydronephrosis caused by extrinsic compression of the ureters, displaced bladder. PSA pending. GU has seen the patient in consultation and recommend percutaneous nephrostomy tubes.   Assessment/Plan:   Principal Problem:  Back pain in the setting of sclerotic bone lesions / massive retroperitoneal and pelvic lymphadenopathy / possible lymphoma, metastatic disease / small nodular changes in lung   Abdominal CT (renal stone protocol) on admission showed massive retroperitoneal and pelvic lymphadenopathy, diffuse sclerotic bone lesions, and indeterminate small nodular changes in the lungs likely representing lymphoma but extensive metastatic disease is also possible. There is bilateral hydronephrosis caused by extrinsic compression of the ureters. Displaced bladder.  CT chest without the contrast done 01/27/2014 and it showed few scattered subpleural and more central foci of increased interstitial density which is nonspecific but could reflect pneumonitis. No evidence of metastases in lungs. Widespread sclerotic lesions again noted consistent with metastatic disease.  I spoke with Dr. Benay Spice over the weekend who recommended obtaining PSA, urology  consult and IR for biopsy of retroperitoneal lymph node. PSA is still pending. Depending on the results of the PSA and biopsy will then consult oncology if needed. Since no neurologic complaints and no complaints of back pain Dr. Benay Spice recommended not starting decadron.  Active Problems:  Bilateral hydronephrosis / extrinsic compression of the ureters  Bilateral hydronephrosis / extrinsic compression of the ureters seen on CT abdomen. GU consulted and recommendation was for percutaneous nephrostomy tubes to be placed by IR. IR consulted and appreciate their recommendations.  PSA pending. High alkaline phosphatase  Likely due to bone resorption. No evidence of liver metastases on CT scan.  Acute renal failure  Possibly even chronic but no previous values for comparison.  Continue IV fluids. Creatinine trending up from admission 1.56 to 1.83  History of drug abuse / smoker  Counseled on cessation.  Nicotine patch ordered. DVT Prophylaxis  Heparin subQ ordered.   Code Status: Full.  Family Communication: updated the patient at the bedside  Disposition Plan: Home when stable.    IV Access:   Peripheral IV Procedures and diagnostic studies:   Ct Renal Stone Study 01/26/2014 Massive retroperitoneal and pelvic lymphadenopathy, diffuse sclerotic bone lesions, and indeterminate small nodular changes in the lungs likely representing lymphoma but extensive metastatic disease is also possible. There is bilateral hydronephrosis caused by extrinsic compression of the ureters. Bladder is displaced. Small esophageal hiatal hernia.  CT chest without the contrast 01/26/2014: There are a few scattered subpleural and more central foci of increased interstitial density which are nonspecific but could reflect pneumonitis. Subtle subpleural subcentimeter nodules are nonspecific and most likely inflammatory. There is no malignant-appearing parenchymal mass. There is no mediastinal nor hilar lymphadenopathy. There  is no pleural nor pericardial effusion. 2. There are widespread sclerotic lesions of the bone consistent with metastatic disease. Medical Consultants:   Oncology -  phone call only IR  Urology Other Consultants:   Physical therapy  Anti-Infectives:   None    Leisa Lenz, MD  [facility] Pager [phone]  If 7PM-7AM, please contact night-coverage [email or web address] Password [email or web address] 01/28/2014, 6:10 AM   LOS: 3 days    HPI/Subjective: No acute overnight events.  Objective: Filed Vitals:   01/26/14 2157 01/27/14 0438 01/27/14 1355 01/27/14 2054  BP: 113/65 103/65 100/56 112/75  Pulse: 80 76 80 80  Temp: 98.4 F (36.9 C) 98.1 F (36.7 C) 98.3 F (36.8 C) 98.1 F (36.7 C)  TempSrc: Oral Oral Oral Oral  Resp: 16 16 16 16   Height:      Weight:      SpO2: 97% 98% 98% 96%    Intake/Output Summary (Last 24 hours) at 01/28/14 0610 Last data filed at 01/27/14 1800  Gross per 24 hour  Intake   2070 ml  Output      0 ml  Net   2070 ml    Exam:   General:  Pt is not in acute distress  Cardiovascular: Regular rate and rhythm, S1/S2 appreciated   Respiratory: Clear to auscultation bilaterally, no wheezing, no crackles, no rhonchi  Abdomen: non tender, bowel sounds present  Extremities: No LE pitting edema, pulses DP and PT palpable bilaterally  Neuro: No focal neurologic deficits.  Data Reviewed: Basic Metabolic Panel:  Recent Labs Lab 01/26/14 0300 01/27/14 1224 01/28/14 0402  NA 136* 136* 137  K 4.4 4.5 4.6  CL 100 101 103  CO2 22 23 22   GLUCOSE 84 106* 96  BUN 17 21 21   CREATININE 1.56* 1.76* 1.83*  CALCIUM 9.2 8.9 8.8   Liver Function Tests:  Recent Labs Lab 01/26/14 0300  AST 32  ALT 7  ALKPHOS 137*  BILITOT 0.3  PROT 7.4  ALBUMIN 3.6   No results found for this basename: LIPASE, AMYLASE,  in the last 168 hours No results found for this basename: AMMONIA,  in the last 168 hours CBC:  Recent Labs Lab 01/26/14 0300 01/28/14 0402   WBC 6.3 6.5  NEUTROABS 4.4  --   HGB 10.1* 8.6*  HCT 29.2* 25.3*  MCV 86.6 88.5  PLT 327 285   Cardiac Enzymes: No results found for this basename: CKTOTAL, CKMB, CKMBINDEX, TROPONINI,  in the last 168 hours BNP: No components found with this basename: POCBNP,  CBG:  Recent Labs Lab 01/27/14 0732  GLUCAP 85    No results found for this or any previous visit (from the past 240 hour(s)).   Scheduled Meds: .  ceFAZolin (ANCEF) IV  2 g Intravenous On Call  . heparin  5,000 Units Subcutaneous 3 times per day  . nicotine  14 mg Transdermal Daily  . senna  1 tablet Oral BID   Continuous Infusions: . sodium chloride 100 mL/hr at 01/27/14 2307

## 2014-01-28 NOTE — Procedures (Signed)
Successful [place] and fluoroscopic guided placement of a bilateral PCNs with ends coiled and locked in the renal pelvis. PCN connected to gravity bag. No immediate post procedural complications.

## 2014-01-28 NOTE — Progress Notes (Addendum)
Subjective: [name]. NPO awaiting bilateral PCNs and RP biopsy by IR  Objective: Vital signs in last 24 hours: Temp:  [98.1 F (36.7 C)-98.3 F (36.8 C)] 98.2 F (36.8 C) (10/05 0609) Pulse Rate:  [76-80] 76 (10/05 0609) Resp:  [16-18] 18 (10/05 0609) BP: (100-115)/(56-75) 115/62 mmHg (10/05 0609) SpO2:  [96 %-98 %] 98 % (10/05 0609)  Intake/Output from previous day: 10/04 0701 - 10/05 0700 In: 2070 [P.O.:1260; I.V.:810] Out: -  Intake/Output this shift:    Physical Exam:  Constitutional: Alert and oriented, No acute distress  Cardiovascular: Regular rate and rhythm, No JVD  Respiratory: Normal respiratory effort, Lungs clear bilaterally  GI: Abdomen is soft, nontender, nondistended, no abdominal masses  Genitourinary: No CVAT. Normal male phallus, testes are descended bilaterally and non-tender and without masses, scrotum is normal in appearance without lesions or masses, perineum is normal on inspection.  Rectal: Normal sphincter tone. Prostate is markedly enlarged, firm, nodular, and asymmetric with right side > Left side  Neurologic: Grossly intact, no focal deficits  Psychiatric: Appropriate mood and affect   Lab Results: CBC Latest Ref Rng 01/28/2014 01/26/2014  WBC 4.0 - 10.5 K/uL 6.5 6.3  Hemoglobin 13.0 - 17.0 g/dL 8.6(L) 10.1(L)  Hematocrit 39.0 - 52.0 % 25.3(L) 29.2(L)  Platelets 150 - 400 K/uL 285 327      Recent Labs  01/27/14 1224 01/28/14 0402  NA 136* 137  K 4.5 4.6  CL 101 103  CO2 23 22  GLUCOSE 106* 96  BUN 21 21  CREATININE 1.76* 1.83*  CALCIUM 8.9 8.8   Cr 10/3: 1.56  Studies/Results: Ct Chest Wo Contrast  01/26/2014   CLINICAL DATA:  Nodules demonstrated in the lung bases on abdominal CT scan of earlier today. The patient has known massive intraperitoneal and retroperitoneal lymphadenopathy and sclerotic bone lesions  EXAM: CT CHEST WITHOUT CONTRAST  TECHNIQUE: Multidetector CT imaging of the chest was performed following the standard  protocol without IV contrast.  COMPARISON:  Abdominal pelvic CT scan of today's date  FINDINGS: The cardiac chambers are normal. The caliber of the thoracic aorta is normal. There is no mediastinal nor hilar lymphadenopathy. There is no pleural nor pericardial effusion.  At lung window settings there are emphysematous changes bilaterally. There are subtle areas of ill-defined nodularity in both lungs. There are a few small patchy subpleural and central foci of interstitial density which are nonspecific. There are no air bronchograms. There is no bronchiectasis.  There are sclerotic lesions throughout the thoracic spine as well as within multiple ribs and in the scapulae and in the left humeral head. There is a well-defined subcentimeter soft tissue density nodule just above the dome of the left hemidiaphragm. There is no interstitial or alveolar infiltrate.  IMPRESSION: 1. There are a few scattered subpleural and more central foci of increased interstitial density which are nonspecific but could reflect pneumonitis. Subtle subpleural subcentimeter nodules are nonspecific and most likely inflammatory. There is no malignant-appearing parenchymal mass. There is no mediastinal nor hilar lymphadenopathy. There is no pleural nor pericardial effusion. 2. There are widespread sclerotic lesions of the bone consistent with metastatic disease.   Electronically Signed   By: Adelbert  Martinique   On: 01/26/2014 14:49    Assessment/Plan: 18P with no prior medical care who presents with symptomatic bilateral retroperitoneal lymphadenopathy causing bilateral extrinsic compression of his ureters, bilateral hydronephrosis and renal failure (unknown whether acute or chronic). He also has sclerotic bone lesions and lung lesions. His diagnosis is still unclear (  prostate cancer vs lymphoma vs other) although rectal exam is very concerning for prostate cancer. PSA is pending. Suspect urinary symptoms is due to mass effect of compression on  bladder.   -- recommend bilateral PCNs as indwelling ureteral stents are unlikely to be successful in the long term. Cr continues to rise.   -- biopsy of RP masses  -- follow up PSA     LOS: 3 days   Wynetta Emery, Berdell C 01/28/2014, 6:31 AM     Attending attestation: I discussed patient with Dr. Wynetta Emery - I agree with his assessment and plan.

## 2014-01-29 LAB — GLUCOSE, CAPILLARY: GLUCOSE-CAPILLARY: 162 mg/dL — AB (ref 70–99)

## 2014-01-29 LAB — TESTOSTERONE: TESTOSTERONE: 496 ng/dL (ref 300–890)

## 2014-01-29 LAB — BASIC METABOLIC PANEL
ANION GAP: 12 (ref 5–15)
BUN: 17 mg/dL (ref 6–23)
CALCIUM: 8.8 mg/dL (ref 8.4–10.5)
CO2: 23 meq/L (ref 19–32)
CREATININE: 1.6 mg/dL — AB (ref 0.50–1.35)
Chloride: 99 mEq/L (ref 96–112)
GFR calc Af Amer: 50 mL/min — ABNORMAL LOW (ref >90)
GFR calc non Af Amer: 43 mL/min — ABNORMAL LOW (ref >90)
Glucose, Bld: 117 mg/dL — ABNORMAL HIGH (ref 70–99)
Potassium: 4.6 mEq/L (ref 3.7–5.3)
Sodium: 134 mEq/L — ABNORMAL LOW (ref 137–147)

## 2014-01-29 LAB — PSA: PSA: >5000 ng/mL — ABNORMAL HIGH (ref <=4.00)

## 2014-01-29 MED ORDER — ZOLPIDEM TARTRATE 5 MG PO TABS
5.0000 mg | ORAL_TABLET | Freq: Every day | ORAL | Status: DC
  Administered 2014-01-29 – 2014-01-31 (×3): 5 mg via ORAL
  Filled 2014-01-29 (×3): qty 1

## 2014-01-29 NOTE — Progress Notes (Addendum)
Subjective: [name]. S/P bilateral PCNs and RP biopsy by IR  Objective: Vital signs in last 24 hours: Temp:  [97.5 F (36.4 C)-98.5 F (36.9 C)] 97.9 F (36.6 C) (10/06 0609) Pulse Rate:  [64-89] 86 (10/06 0609) Resp:  [11-27] 20 (10/06 0609) BP: (96-128)/(50-77) 108/63 mmHg (10/06 0609) SpO2:  [29 %-100 %] 98 % (10/06 0609)  Intake/Output from previous day: 10/05 0701 - 10/06 0700 In: 1396.7 [P.O.:260; I.V.:1086.7; IV Piggyback:50] Out: 575 [Urine:575] Intake/Output this shift:    Physical Exam:  Constitutional: Alert and oriented, No acute distress  Cardiovascular: Regular rate and rhythm, No JVD  Respiratory: Normal respiratory effort, Lungs clear bilaterally  GI: Abdomen is soft, nontender, nondistended, no abdominal masses  Genitourinary: B/L neph tubes in place, clear yellow urine. Normal male phallus, testes are descended bilaterally and non-tender and without masses, scrotum is normal in appearance without lesions or masses, perineum is normal on inspection.  Rectal: Normal sphincter tone. Prostate is markedly enlarged, firm, nodular, and asymmetric with right side > Left side  Neurologic: Grossly intact, no focal deficits  Psychiatric: Appropriate mood and affect   Lab Results: CBC Latest Ref Rng 01/28/2014 01/26/2014  WBC 4.0 - 10.5 K/uL 6.5 6.3  Hemoglobin 13.0 - 17.0 g/dL 8.6(L) 10.1(L)  Hematocrit 39.0 - 52.0 % 25.3(L) 29.2(L)  Platelets 150 - 400 K/uL 285 327      Recent Labs  01/27/14 1224 01/28/14 0402  NA 136* 137  K 4.5 4.6  CL 101 103  CO2 23 22  GLUCOSE 106* 96  BUN 21 21  CREATININE 1.76* 1.83*  CALCIUM 8.9 8.8   Cr 10/3: 1.56  Studies/Results: Ir Perc Nephrostomy Left  01/28/2014   INDICATION: History of pelvic mass concerning for metastatic prostate cancer, now with bilateral obstructive hydronephrosis. Please place bilateral percutaneous nephrostomy catheters for renal function preservation purposes.  EXAM: 1. ULTRASOUND GUIDANCE FOR  PUNCTURE OF THE BILATERAL RENAL COLLECTING SYSTEMS 2. BILATERAL PERCUTANEOUS NEPHROSTOMY TUBE PLACEMENT.  COMPARISON:  CT chest, abdomen pelvis - 01/26/2014  MEDICATIONS: Ancef 2 g IV; The antibiotic was administered in an appropriate time frame prior to skin puncture.  ANESTHESIA/SEDATION: Fentanyl 150 mcg IV; Versed 3 mg IV  Total Moderate Sedation Time  56 minutes.  CONTRAST:  A total of 25 mL Isovue 300 was administered into the bilateral collecting systems  FLUOROSCOPY TIME:  4 minutes 0 seconds.  COMPLICATIONS: None immediate  PROCEDURE: The procedure, risks, benefits, and alternatives were explained to the patient. Questions regarding the procedure were encouraged and answered. The patient understands and consents to the procedure. A timeout was performed prior to the initiation of the procedure.  The bilateral flank region was prepped with Betadine in a sterile fashion, and a sterile drape was applied covering the operative field. A sterile gown and sterile gloves were used for the procedure. Local anesthesia was provided with 1% Lidocaine with epinephrine.  Initially, ultrasound was used to localize the left kidney. Under direct ultrasound guidance, a 21 gauge needle was advanced into the renal collecting system. An ultrasound image documentation was performed. Access within the collecting system was confirmed with the efflux of urine followed by contrast injection.  Over a Nitrex wire and under intermittent fluoroscopic guidance, the tract was dilated with an Accustick stent. Over a guide wire, a 10-French percutaneous nephrostomy catheter was advanced into the collecting system where the coil was formed and locked. Contrast was injected and several sport radiographs were obtained in various obliquities confirming access. The catheter was secured  at the skin with a Prolene retention suture and a gravity bag was placed.  The identical procedure was then performed for the contralateral right kidney  ultimately allowing successful placement of a 10 French percutaneous nephrostomy catheter with and coiled and locked within the right renal pelvis.  Dressings were placed. The patient tolerated the above procedures well without immediate postprocedural complication.  FINDINGS: Ultrasound scanning demonstrates a moderate to severely dilated left-sided renal collecting system, as was demonstrated on preprocedural abdominal CT. Under direct ultrasound guidance, a posterior inferior calix was targeted allowing advancement of an 10-French percutaneous nephrostomy catheter under intermittent fluoroscopic guidance. Contrast injection confirmed appropriate positioning.  Ultrasound scanning demonstrated mild dilatation of the right renal collecting system. Under direct ultrasound guidance, a posterior inferior cannulate was targeted ultimately allowing placement of a 10 French percutaneous nephrostomy catheter with end coiled and locked within all right renal pelvis. Contrast injection confirmed appropriate positioning.  IMPRESSION: Successful ultrasound and fluoroscopic guided placement of bilateral 10 French PCNs.  PLAN: The patient could undergo attempted conversion of the bilateral percutaneous nephrostomy catheters to bilateral double-J ureteral stents in the next 1-2 weeks pending the ultimate plan of care regarding the biopsied pelvic masses.   Electronically Signed   By: Sandi Mariscal M.D.   On: 01/28/2014 13:36   Ir Perc Nephrostomy Right  01/28/2014   INDICATION: History of pelvic mass concerning for metastatic prostate cancer, now with bilateral obstructive hydronephrosis. Please place bilateral percutaneous nephrostomy catheters for renal function preservation purposes.  EXAM: 1. ULTRASOUND GUIDANCE FOR PUNCTURE OF THE BILATERAL RENAL COLLECTING SYSTEMS 2. BILATERAL PERCUTANEOUS NEPHROSTOMY TUBE PLACEMENT.  COMPARISON:  CT chest, abdomen pelvis - 01/26/2014  MEDICATIONS: Ancef 2 g IV; The antibiotic was  administered in an appropriate time frame prior to skin puncture.  ANESTHESIA/SEDATION: Fentanyl 150 mcg IV; Versed 3 mg IV  Total Moderate Sedation Time  56 minutes.  CONTRAST:  A total of 25 mL Isovue 300 was administered into the bilateral collecting systems  FLUOROSCOPY TIME:  4 minutes 0 seconds.  COMPLICATIONS: None immediate  PROCEDURE: The procedure, risks, benefits, and alternatives were explained to the patient. Questions regarding the procedure were encouraged and answered. The patient understands and consents to the procedure. A timeout was performed prior to the initiation of the procedure.  The bilateral flank region was prepped with Betadine in a sterile fashion, and a sterile drape was applied covering the operative field. A sterile gown and sterile gloves were used for the procedure. Local anesthesia was provided with 1% Lidocaine with epinephrine.  Initially, ultrasound was used to localize the left kidney. Under direct ultrasound guidance, a 21 gauge needle was advanced into the renal collecting system. An ultrasound image documentation was performed. Access within the collecting system was confirmed with the efflux of urine followed by contrast injection.  Over a Nitrex wire and under intermittent fluoroscopic guidance, the tract was dilated with an Accustick stent. Over a guide wire, a 10-French percutaneous nephrostomy catheter was advanced into the collecting system where the coil was formed and locked. Contrast was injected and several sport radiographs were obtained in various obliquities confirming access. The catheter was secured at the skin with a Prolene retention suture and a gravity bag was placed.  The identical procedure was then performed for the contralateral right kidney ultimately allowing successful placement of a 10 French percutaneous nephrostomy catheter with and coiled and locked within the right renal pelvis.  Dressings were placed. The patient tolerated the above  procedures well  without immediate postprocedural complication.  FINDINGS: Ultrasound scanning demonstrates a moderate to severely dilated left-sided renal collecting system, as was demonstrated on preprocedural abdominal CT. Under direct ultrasound guidance, a posterior inferior calix was targeted allowing advancement of an 10-French percutaneous nephrostomy catheter under intermittent fluoroscopic guidance. Contrast injection confirmed appropriate positioning.  Ultrasound scanning demonstrated mild dilatation of the right renal collecting system. Under direct ultrasound guidance, a posterior inferior cannulate was targeted ultimately allowing placement of a 10 French percutaneous nephrostomy catheter with end coiled and locked within all right renal pelvis. Contrast injection confirmed appropriate positioning.  IMPRESSION: Successful ultrasound and fluoroscopic guided placement of bilateral 10 French PCNs.  PLAN: The patient could undergo attempted conversion of the bilateral percutaneous nephrostomy catheters to bilateral double-J ureteral stents in the next 1-2 weeks pending the ultimate plan of care regarding the biopsied pelvic masses.   Electronically Signed   By: Sandi Mariscal M.D.   On: 01/28/2014 13:36   Ir US Guide Bx Asp/drain  01/28/2014   INDICATION: Pelvic mass and retroperitoneal adenopathy with multiple sclerotic osseous lesions worrisome for metastatic prostate cancer. Please perform ultrasound-guided pelvic mass biopsy for tissue diagnostic purposes.  EXAM: ULTRASOUND GUIDED RIGHT-SIDED PELVIC MASS BIOPSY  COMPARISON:  CT of the chest, abdomen pelvis - 01/26/2014  MEDICATIONS: Fentanyl 50 mcg IV; Versed 1 mg IV  ANESTHESIA/SEDATION: Total Moderate Sedation time  10 minutes  COMPLICATIONS: None immediate  PROCEDURE: Informed written consent was obtained from the patient after a discussion of the risks, benefits and alternatives to treatment. The patient understands and consents the procedure. A  timeout was performed prior to the initiation of the procedure.  Ultrasound scanning was performed of the groin demonstrating a mixed echogenic solid mass within the right lower pelvis compatible with the mass seen on preceding CT of the abdomen and pelvis.  The skin overlying the anterior aspect of the right lower abdomen/pelvis was prepped and draped in usual sterile fashion. The overlying soft tissues were anesthetized with 1% lidocaine with epinephrine. A 17 gauge, 6.8 cm co-axial needle was advanced into a peripheral aspect of the lesion. This was followed by 4 core biopsies with an 18 gauge core device under direct ultrasound guidance.  The co-axial needle was removed and hemostasis was obtained with manual compression. Post procedural scanning was negative for definitive area of hemorrhage or additional complication. A dressing was placed. The patient tolerated the procedure well without immediate post procedural complication.  IMPRESSION: Technically successful ultrasound guided core needle biopsy of mixed echogenic solid mass within the anterior aspect of the right lower pelvis as was demonstrated on preprocedural abdominal CT.   Electronically Signed   By: Sandi Mariscal M.D.   On: 01/28/2014 13:42   Ir US Guide Bx Asp/drain  01/28/2014   INDICATION: History of pelvic mass concerning for metastatic prostate cancer, now with bilateral obstructive hydronephrosis. Please place bilateral percutaneous nephrostomy catheters for renal function preservation purposes.  EXAM: 1. ULTRASOUND GUIDANCE FOR PUNCTURE OF THE BILATERAL RENAL COLLECTING SYSTEMS 2. BILATERAL PERCUTANEOUS NEPHROSTOMY TUBE PLACEMENT.  COMPARISON:  CT chest, abdomen pelvis - 01/26/2014  MEDICATIONS: Ancef 2 g IV; The antibiotic was administered in an appropriate time frame prior to skin puncture.  ANESTHESIA/SEDATION: Fentanyl 150 mcg IV; Versed 3 mg IV  Total Moderate Sedation Time  56 minutes.  CONTRAST:  A total of 25 mL Isovue 300 was  administered into the bilateral collecting systems  FLUOROSCOPY TIME:  4 minutes 0 seconds.  COMPLICATIONS: None immediate  PROCEDURE: The  procedure, risks, benefits, and alternatives were explained to the patient. Questions regarding the procedure were encouraged and answered. The patient understands and consents to the procedure. A timeout was performed prior to the initiation of the procedure.  The bilateral flank region was prepped with Betadine in a sterile fashion, and a sterile drape was applied covering the operative field. A sterile gown and sterile gloves were used for the procedure. Local anesthesia was provided with 1% Lidocaine with epinephrine.  Initially, ultrasound was used to localize the left kidney. Under direct ultrasound guidance, a 21 gauge needle was advanced into the renal collecting system. An ultrasound image documentation was performed. Access within the collecting system was confirmed with the efflux of urine followed by contrast injection.  Over a Nitrex wire and under intermittent fluoroscopic guidance, the tract was dilated with an Accustick stent. Over a guide wire, a 10-French percutaneous nephrostomy catheter was advanced into the collecting system where the coil was formed and locked. Contrast was injected and several sport radiographs were obtained in various obliquities confirming access. The catheter was secured at the skin with a Prolene retention suture and a gravity bag was placed.  The identical procedure was then performed for the contralateral right kidney ultimately allowing successful placement of a 10 French percutaneous nephrostomy catheter with and coiled and locked within the right renal pelvis.  Dressings were placed. The patient tolerated the above procedures well without immediate postprocedural complication.  FINDINGS: Ultrasound scanning demonstrates a moderate to severely dilated left-sided renal collecting system, as was demonstrated on preprocedural abdominal  CT. Under direct ultrasound guidance, a posterior inferior calix was targeted allowing advancement of an 10-French percutaneous nephrostomy catheter under intermittent fluoroscopic guidance. Contrast injection confirmed appropriate positioning.  Ultrasound scanning demonstrated mild dilatation of the right renal collecting system. Under direct ultrasound guidance, a posterior inferior cannulate was targeted ultimately allowing placement of a 10 French percutaneous nephrostomy catheter with end coiled and locked within all right renal pelvis. Contrast injection confirmed appropriate positioning.  IMPRESSION: Successful ultrasound and fluoroscopic guided placement of bilateral 10 French PCNs.  PLAN: The patient could undergo attempted conversion of the bilateral percutaneous nephrostomy catheters to bilateral double-J ureteral stents in the next 1-2 weeks pending the ultimate plan of care regarding the biopsied pelvic masses.   Electronically Signed   By: Sandi Mariscal M.D.   On: 01/28/2014 13:36   Ir US Guide Bx Asp/drain  01/28/2014   INDICATION: History of pelvic mass concerning for metastatic prostate cancer, now with bilateral obstructive hydronephrosis. Please place bilateral percutaneous nephrostomy catheters for renal function preservation purposes.  EXAM: 1. ULTRASOUND GUIDANCE FOR PUNCTURE OF THE BILATERAL RENAL COLLECTING SYSTEMS 2. BILATERAL PERCUTANEOUS NEPHROSTOMY TUBE PLACEMENT.  COMPARISON:  CT chest, abdomen pelvis - 01/26/2014  MEDICATIONS: Ancef 2 g IV; The antibiotic was administered in an appropriate time frame prior to skin puncture.  ANESTHESIA/SEDATION: Fentanyl 150 mcg IV; Versed 3 mg IV  Total Moderate Sedation Time  56 minutes.  CONTRAST:  A total of 25 mL Isovue 300 was administered into the bilateral collecting systems  FLUOROSCOPY TIME:  4 minutes 0 seconds.  COMPLICATIONS: None immediate  PROCEDURE: The procedure, risks, benefits, and alternatives were explained to the patient.  Questions regarding the procedure were encouraged and answered. The patient understands and consents to the procedure. A timeout was performed prior to the initiation of the procedure.  The bilateral flank region was prepped with Betadine in a sterile fashion, and a sterile drape was applied covering the  operative field. A sterile gown and sterile gloves were used for the procedure. Local anesthesia was provided with 1% Lidocaine with epinephrine.  Initially, ultrasound was used to localize the left kidney. Under direct ultrasound guidance, a 21 gauge needle was advanced into the renal collecting system. An ultrasound image documentation was performed. Access within the collecting system was confirmed with the efflux of urine followed by contrast injection.  Over a Nitrex wire and under intermittent fluoroscopic guidance, the tract was dilated with an Accustick stent. Over a guide wire, a 10-French percutaneous nephrostomy catheter was advanced into the collecting system where the coil was formed and locked. Contrast was injected and several sport radiographs were obtained in various obliquities confirming access. The catheter was secured at the skin with a Prolene retention suture and a gravity bag was placed.  The identical procedure was then performed for the contralateral right kidney ultimately allowing successful placement of a 10 French percutaneous nephrostomy catheter with and coiled and locked within the right renal pelvis.  Dressings were placed. The patient tolerated the above procedures well without immediate postprocedural complication.  FINDINGS: Ultrasound scanning demonstrates a moderate to severely dilated left-sided renal collecting system, as was demonstrated on preprocedural abdominal CT. Under direct ultrasound guidance, a posterior inferior calix was targeted allowing advancement of an 10-French percutaneous nephrostomy catheter under intermittent fluoroscopic guidance. Contrast injection  confirmed appropriate positioning.  Ultrasound scanning demonstrated mild dilatation of the right renal collecting system. Under direct ultrasound guidance, a posterior inferior cannulate was targeted ultimately allowing placement of a 10 French percutaneous nephrostomy catheter with end coiled and locked within all right renal pelvis. Contrast injection confirmed appropriate positioning.  IMPRESSION: Successful ultrasound and fluoroscopic guided placement of bilateral 10 French PCNs.  PLAN: The patient could undergo attempted conversion of the bilateral percutaneous nephrostomy catheters to bilateral double-J ureteral stents in the next 1-2 weeks pending the ultimate plan of care regarding the biopsied pelvic masses.   Electronically Signed   By: Sandi Mariscal M.D.   On: 01/28/2014 13:36   PSA: >5,000  Assessment/Plan: 57M with no prior medical care who presents with symptomatic bilateral retroperitoneal lymphadenopathy causing bilateral extrinsic compression of his ureters, bilateral hydronephrosis and renal failure (unknown whether acute or chronic). He also has sclerotic bone lesions and lung lesions. Drastically elevated PSA (>5000) and clinical exam suspicious for advanced prostate cancer. Suspect urinary symptoms is due to mass effect of compression on bladder.   -- continue bilateral PCNs as indwelling ureteral stents are unlikely to be successful in the long term. Trend Cr. -- follow upon biopsy of RP masses -- consider Med Onc consult to start androgen deprivation and consider chemotherapy options.    LOS: 4 days   [name], Jeff C 01/29/2014, 7:20 AM  Add: Pt personally seen and examined. I discussed patient with Dr. Wynetta Emery and agree with his assessment and plan.

## 2014-01-29 NOTE — Progress Notes (Signed)
Patient ID: Jeff [name], male   DOB: 1946/02/20, 68 y.o.   MRN: [id number]   Referring Physician(s): [facility]/Dr. Junious Silk  Subjective: pt doing fairly well ;denies sig flank or abd pain; minimal urine output    Allergies: Review of patient's allergies indicates no known allergies.  Medications: Prior to Admission medications   Not on File    Review of Systems see above  Vital Signs: BP 106/63  Pulse 75  Temp(Src) 98.4 F (36.9 C) (Oral)  Resp 18  Ht 6' (1.829 m)  Wt 151 lb 0.2 oz (68.5 kg)  BMI 20.48 kg/m2  SpO2 99%  Physical Exam pt awake/alert; R/L PCN's intact, outputs R- 975 cc's ; L- 100 cc's blood tinged urine; sites NT  Imaging: Ct Chest Wo Contrast  01/26/2014   CLINICAL DATA:  Nodules demonstrated in the lung bases on abdominal CT scan of earlier today. The patient has known massive intraperitoneal and retroperitoneal lymphadenopathy and sclerotic bone lesions  EXAM: CT CHEST WITHOUT CONTRAST  TECHNIQUE: Multidetector CT imaging of the chest was performed following the standard protocol without IV contrast.  COMPARISON:  Abdominal pelvic CT scan of today's date  FINDINGS: The cardiac chambers are normal. The caliber of the thoracic aorta is normal. There is no mediastinal nor hilar lymphadenopathy. There is no pleural nor pericardial effusion.  At lung window settings there are emphysematous changes bilaterally. There are subtle areas of ill-defined nodularity in both lungs. There are a few small patchy subpleural and central foci of interstitial density which are nonspecific. There are no air bronchograms. There is no bronchiectasis.  There are sclerotic lesions throughout the thoracic spine as well as within multiple ribs and in the scapulae and in the left humeral head. There is a well-defined subcentimeter soft tissue density nodule just above the dome of the left hemidiaphragm. There is no interstitial or alveolar infiltrate.  IMPRESSION: 1. There are a few scattered  subpleural and more central foci of increased interstitial density which are nonspecific but could reflect pneumonitis. Subtle subpleural subcentimeter nodules are nonspecific and most likely inflammatory. There is no malignant-appearing parenchymal mass. There is no mediastinal nor hilar lymphadenopathy. There is no pleural nor pericardial effusion. 2. There are widespread sclerotic lesions of the bone consistent with metastatic disease.   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 Please place bilateral percutaneous nephrostomy catheters for renal function preservation purposes.  EXAM: 1. ULTRASOUND GUIDANCE FOR PUNCTURE OF THE BILATERAL RENAL COLLECTING SYSTEMS 2. BILATERAL PERCUTANEOUS NEPHROSTOMY TUBE PLACEMENT.  COMPARISON:  CT chest, abdomen pelvis - 01/26/2014  MEDICATIONS: Ancef 2 g IV; The antibiotic was administered in an appropriate time frame prior to skin puncture.  ANESTHESIA/SEDATION: Fentanyl 150 mcg IV; Versed 3 mg IV  Total Moderate Sedation Time  56 minutes.  CONTRAST:  A total of 25 mL Isovue 300 was administered into the bilateral collecting systems  FLUOROSCOPY TIME:  4 minutes 0 seconds.  COMPLICATIONS: None immediate  PROCEDURE: The procedure, risks, benefits, and alternatives were explained to the patient. Questions regarding the procedure were encouraged and answered. The patient understands and consents to the procedure. A timeout was performed prior to the initiation of the procedure.  The bilateral flank region was prepped with Betadine in a sterile fashion, and a sterile drape was applied covering the operative field. A sterile gown  and sterile gloves were used for the procedure. Local anesthesia was provided with 1% Lidocaine with epinephrine.  Initially, ultrasound was used to localize the left  kidney. Under direct ultrasound guidance, a 21 gauge needle was advanced into the renal collecting system. An ultrasound image documentation was performed. Access within the collecting system was confirmed with the efflux of urine followed by contrast injection.  Over a Nitrex wire and under intermittent fluoroscopic guidance, the tract was dilated with an Accustick stent. Over a guide wire, a 10-French percutaneous nephrostomy catheter was advanced into the collecting system where the coil was formed and locked. Contrast was injected and several sport radiographs were obtained in various obliquities confirming access. The catheter was secured at the skin with a Prolene retention suture and a gravity bag was placed.  The identical procedure was then performed for the contralateral right kidney ultimately allowing successful placement of a 10 French percutaneous nephrostomy catheter with and coiled and locked within the right renal pelvis.  Dressings were placed. The patient tolerated the above procedures well without immediate postprocedural complication.  FINDINGS: Ultrasound scanning demonstrates a moderate to severely dilated left-sided renal collecting system, as was demonstrated on preprocedural abdominal CT. Under direct ultrasound guidance, a posterior inferior calix was targeted allowing advancement of an 10-French percutaneous nephrostomy catheter under intermittent fluoroscopic guidance. Contrast injection confirmed appropriate positioning.  Ultrasound scanning demonstrated mild dilatation of the right renal collecting system. Under direct ultrasound guidance, a posterior inferior cannulate was targeted ultimately allowing placement of a 10 French percutaneous nephrostomy catheter with end coiled and locked within all right renal pelvis. Contrast injection confirmed appropriate positioning.  IMPRESSION: Successful ultrasound and fluoroscopic guided placement of bilateral 10 French PCNs.  PLAN: The patient  could undergo attempted conversion of the bilateral percutaneous nephrostomy catheters to bilateral double-J ureteral stents in the next 1-2 weeks pending the ultimate plan of care regarding the biopsied pelvic masses.   Electronically Signed   By: Sandi Mariscal M.D.   On: 01/28/2014 13:36   Ir Perc Nephrostomy Right  01/28/2014   INDICATION: History of pelvic mass concerning for metastatic prostate cancer, now with bilateral obstructive hydronephrosis. Please place bilateral percutaneous nephrostomy catheters for renal function preservation purposes.  EXAM: 1. ULTRASOUND GUIDANCE FOR PUNCTURE OF THE BILATERAL RENAL COLLECTING SYSTEMS 2. BILATERAL PERCUTANEOUS NEPHROSTOMY TUBE PLACEMENT.  COMPARISON:  CT chest, abdomen pelvis - 01/26/2014  MEDICATIONS: Ancef 2 g IV; The antibiotic was administered in an appropriate time frame prior to skin puncture.  ANESTHESIA/SEDATION: Fentanyl 150 mcg IV; Versed 3 mg IV  Total Moderate Sedation Time  56 minutes.  CONTRAST:  A total of 25 mL Isovue 300 was administered into the bilateral collecting systems  FLUOROSCOPY TIME:  4 minutes 0 seconds.  COMPLICATIONS: None immediate  PROCEDURE: The procedure, risks, benefits, and alternatives were explained to the patient. Questions regarding the procedure were encouraged and answered. The patient understands and consents to the procedure. A timeout was performed prior to the initiation of the procedure.  The bilateral flank region was prepped with Betadine in a sterile fashion, and a sterile drape was applied covering the operative field. A sterile gown and sterile gloves were used for the procedure. Local anesthesia was provided with 1% Lidocaine with epinephrine.  Initially, ultrasound was used to localize the left kidney. Under direct ultrasound guidance, a 21 gauge needle was advanced into the renal collecting system. An ultrasound image documentation was performed. Access within the collecting system was confirmed with the efflux  of urine followed by contrast injection.  Over a Nitrex wire and under intermittent fluoroscopic guidance, the tract was dilated with an Accustick stent. Over a guide wire, a 10-French percutaneous nephrostomy catheter was advanced into the collecting system where the coil was formed and locked. Contrast was injected and several sport radiographs were obtained in various obliquities confirming access. The catheter was secured at the skin with a Prolene retention suture and a gravity bag was placed.  The identical procedure was then performed for the contralateral right kidney ultimately allowing successful placement of a 10 French percutaneous nephrostomy catheter with and coiled and locked within the right renal pelvis.  Dressings were placed. The patient tolerated the above procedures well without immediate postprocedural complication.  FINDINGS: Ultrasound scanning demonstrates a moderate to severely dilated left-sided renal collecting system, as was demonstrated on preprocedural abdominal CT. Under direct ultrasound guidance, a posterior inferior calix was targeted allowing advancement of an 10-French percutaneous nephrostomy catheter under intermittent fluoroscopic guidance. Contrast injection confirmed appropriate positioning.  Ultrasound scanning demonstrated mild dilatation of the right renal collecting system. Under direct ultrasound guidance, a posterior inferior cannulate was targeted ultimately allowing placement of a 10 French percutaneous nephrostomy catheter with end coiled and locked within all right renal pelvis. Contrast injection confirmed appropriate positioning.  IMPRESSION: Successful ultrasound and fluoroscopic guided placement of bilateral 10 French PCNs.  PLAN: The patient could undergo attempted conversion of the bilateral percutaneous nephrostomy catheters to bilateral double-J ureteral stents in the next 1-2 weeks pending the ultimate plan of care regarding the biopsied pelvic masses.    Electronically Signed   By: Sandi Mariscal M.D.   On: 01/28/2014 13:36   Ir US Guide Bx Asp/drain  01/28/2014   INDICATION: Pelvic mass and retroperitoneal adenopathy with multiple sclerotic osseous lesions worrisome for metastatic prostate cancer. Please perform ultrasound-guided pelvic mass biopsy for tissue diagnostic purposes.  EXAM: ULTRASOUND GUIDED RIGHT-SIDED PELVIC MASS BIOPSY  COMPARISON:  CT of the chest, abdomen pelvis - 01/26/2014  MEDICATIONS: Fentanyl 50 mcg IV; Versed 1 mg IV  ANESTHESIA/SEDATION: Total Moderate Sedation time  10 minutes  COMPLICATIONS: None immediate  PROCEDURE: Informed written consent was obtained from the patient after a discussion of the risks, benefits and alternatives to treatment. The patient understands and consents the procedure. A timeout was performed prior to the initiation of the procedure.  Ultrasound scanning was performed of the groin demonstrating a mixed echogenic solid mass within the right lower pelvis compatible with the mass seen on preceding CT of the abdomen and pelvis.  The skin overlying the anterior aspect of the right lower abdomen/pelvis was prepped and draped in usual sterile fashion. The overlying soft tissues were anesthetized with 1% lidocaine with epinephrine. A 17 gauge, 6.8 cm co-axial needle was advanced into a peripheral aspect of the lesion. This was followed by 4 core biopsies with an 18 gauge core device under direct ultrasound guidance.  The co-axial needle was removed and hemostasis was obtained with manual compression. Post procedural scanning was negative for definitive area of hemorrhage or additional complication. A dressing was placed. The patient tolerated the procedure well without immediate post procedural complication.  IMPRESSION: Technically successful ultrasound guided core needle biopsy of mixed echogenic solid mass within the anterior aspect of the right lower pelvis as was demonstrated on preprocedural abdominal CT.    Electronically Signed   By: Sandi Mariscal M.D.   On: 01/28/2014 13:42   Ir US Guide Bx Asp/drain  01/28/2014   INDICATION:  History of pelvic mass concerning for metastatic prostate cancer, now with bilateral obstructive hydronephrosis. Please place bilateral percutaneous nephrostomy catheters for renal function preservation purposes.  EXAM: 1. ULTRASOUND GUIDANCE FOR PUNCTURE OF THE BILATERAL RENAL COLLECTING SYSTEMS 2. BILATERAL PERCUTANEOUS NEPHROSTOMY TUBE PLACEMENT.  COMPARISON:  CT chest, abdomen pelvis - 01/26/2014  MEDICATIONS: Ancef 2 g IV; The antibiotic was administered in an appropriate time frame prior to skin puncture.  ANESTHESIA/SEDATION: Fentanyl 150 mcg IV; Versed 3 mg IV  Total Moderate Sedation Time  56 minutes.  CONTRAST:  A total of 25 mL Isovue 300 was administered into the bilateral collecting systems  FLUOROSCOPY TIME:  4 minutes 0 seconds.  COMPLICATIONS: None immediate  PROCEDURE: The procedure, risks, benefits, and alternatives were explained to the patient. Questions regarding the procedure were encouraged and answered. The patient understands and consents to the procedure. A timeout was performed prior to the initiation of the procedure.  The bilateral flank region was prepped with Betadine in a sterile fashion, and a sterile drape was applied covering the operative field. A sterile gown and sterile gloves were used for the procedure. Local anesthesia was provided with 1% Lidocaine with epinephrine.  Initially, ultrasound was used to localize the left kidney. Under direct ultrasound guidance, a 21 gauge needle was advanced into the renal collecting system. An ultrasound image documentation was performed. Access within the collecting system was confirmed with the efflux of urine followed by contrast injection.  Over a Nitrex wire and under intermittent fluoroscopic guidance, the tract was dilated with an Accustick stent. Over a guide wire, a 10-French percutaneous nephrostomy catheter  was advanced into the collecting system where the coil was formed and locked. Contrast was injected and several sport radiographs were obtained in various obliquities confirming access. The catheter was secured at the skin with a Prolene retention suture and a gravity bag was placed.  The identical procedure was then performed for the contralateral right kidney ultimately allowing successful placement of a 10 French percutaneous nephrostomy catheter with and coiled and locked within the right renal pelvis.  Dressings were placed. The patient tolerated the above procedures well without immediate postprocedural complication.  FINDINGS: Ultrasound scanning demonstrates a moderate to severely dilated left-sided renal collecting system, as was demonstrated on preprocedural abdominal CT. Under direct ultrasound guidance, a posterior inferior calix was targeted allowing advancement of an 10-French percutaneous nephrostomy catheter under intermittent fluoroscopic guidance. Contrast injection confirmed appropriate positioning.  Ultrasound scanning demonstrated mild dilatation of the right renal collecting system. Under direct ultrasound guidance, a posterior inferior cannulate was targeted ultimately allowing placement of a 10 French percutaneous nephrostomy catheter with end coiled and locked within all right renal pelvis. Contrast injection confirmed appropriate positioning.  IMPRESSION: Successful ultrasound and fluoroscopic guided placement of bilateral 10 French PCNs.  PLAN: The patient could undergo attempted conversion of the bilateral percutaneous nephrostomy catheters to bilateral double-J ureteral stents in the next 1-2 weeks pending the ultimate plan of care regarding the biopsied pelvic masses.   Electronically Signed   By: Sandi Mariscal M.D.   On: 01/28/2014 13:36   Ir US Guide Bx Asp/drain  01/28/2014   INDICATION: History of pelvic mass concerning for metastatic prostate cancer, now with bilateral obstructive  hydronephrosis. Please place bilateral percutaneous nephrostomy catheters for renal function preservation purposes.  EXAM: 1. ULTRASOUND GUIDANCE FOR PUNCTURE OF THE BILATERAL RENAL COLLECTING SYSTEMS 2. BILATERAL PERCUTANEOUS NEPHROSTOMY TUBE PLACEMENT.  COMPARISON:  CT chest, abdomen pelvis - 01/26/2014  MEDICATIONS: Ancef 2 g  IV; The antibiotic was administered in an appropriate time frame prior to skin puncture.  ANESTHESIA/SEDATION: Fentanyl 150 mcg IV; Versed 3 mg IV  Total Moderate Sedation Time  56 minutes.  CONTRAST:  A total of 25 mL Isovue 300 was administered into the bilateral collecting systems  FLUOROSCOPY TIME:  4 minutes 0 seconds.  COMPLICATIONS: None immediate  PROCEDURE: The procedure, risks, benefits, and alternatives were explained to the patient. Questions regarding the procedure were encouraged and answered. The patient understands and consents to the procedure. A timeout was performed prior to the initiation of the procedure.  The bilateral flank region was prepped with Betadine in a sterile fashion, and a sterile drape was applied covering the operative field. A sterile gown and sterile gloves were used for the procedure. Local anesthesia was provided with 1% Lidocaine with epinephrine.  Initially, ultrasound was used to localize the left kidney. Under direct ultrasound guidance, a 21 gauge needle was advanced into the renal collecting system. An ultrasound image documentation was performed. Access within the collecting system was confirmed with the efflux of urine followed by contrast injection.  Over a Nitrex wire and under intermittent fluoroscopic guidance, the tract was dilated with an Accustick stent. Over a guide wire, a 10-French percutaneous nephrostomy catheter was advanced into the collecting system where the coil was formed and locked. Contrast was injected and several sport radiographs were obtained in various obliquities confirming access. The catheter was secured at the skin  with a Prolene retention suture and a gravity bag was placed.  The identical procedure was then performed for the contralateral right kidney ultimately allowing successful placement of a 10 French percutaneous nephrostomy catheter with and coiled and locked within the right renal pelvis.  Dressings were placed. The patient tolerated the above procedures well without immediate postprocedural complication.  FINDINGS: Ultrasound scanning demonstrates a moderate to severely dilated left-sided renal collecting system, as was demonstrated on preprocedural abdominal CT. Under direct ultrasound guidance, a posterior inferior calix was targeted allowing advancement of an 10-French percutaneous nephrostomy catheter under intermittent fluoroscopic guidance. Contrast injection confirmed appropriate positioning.  Ultrasound scanning demonstrated mild dilatation of the right renal collecting system. Under direct ultrasound guidance, a posterior inferior cannulate was targeted ultimately allowing placement of a 10 French percutaneous nephrostomy catheter with end coiled and locked within all right renal pelvis. Contrast injection confirmed appropriate positioning.  IMPRESSION: Successful ultrasound and fluoroscopic guided placement of bilateral 10 French PCNs.  PLAN: The patient could undergo attempted conversion of the bilateral percutaneous nephrostomy catheters to bilateral double-J ureteral stents in the next 1-2 weeks pending the ultimate plan of care regarding the biopsied pelvic masses.   Electronically Signed   By: Sandi Mariscal M.D.   On: 01/28/2014 13:36   Ct Renal Stone Study  01/26/2014   CLINICAL DATA:  Right-sided flank pain, nausea, and is curious starting at 9:30 p.m. tonight. Small amount of hematuria and burning.  EXAM: CT ABDOMEN AND PELVIS WITHOUT CONTRAST  TECHNIQUE: Multidetector CT imaging of the abdomen and pelvis was performed following the standard protocol without IV contrast.  COMPARISON:  None.   FINDINGS: Emphysematous changes in the lung bases with patchy nodular ground-glass opacities, indeterminate. Small esophageal hiatal hernia.  Evaluation is limited due to noncontrast technique, but there appears to be massive retroperitoneal lymphadenopathy, extending into the pelvis all the way down to the level of the symphysis pubis. Largest cross-sectional area measures about 7.2 x 12.9 cm transverse dimension. The multifocal nodular areas of bone  sclerosis demonstrated throughout the visualized spine, ribs, sacrum, pelvis, and hips. Findings likely to represent lymphoma although extensive metastatic disease could also potentially have this appearance. There is a bilateral renal hydronephrosis and hydroureter, greater on the left side. This is likely due to extrinsic compression by the prominent lymphadenopathy. The bladder is decompressed and is displaced anteriorly and laterally. Bladder wall is thickened, possibly due decompression or infection. The unenhanced appearance of the liver, spleen, gallbladder, and adrenal glands is unremarkable. Aorta, inferior vena cava, and pancreas cannot be evaluated due to lymphadenopathy.  IMPRESSION: Massive retroperitoneal and pelvic lymphadenopathy, diffuse sclerotic bone lesions, and indeterminate small nodular changes in the lungs likely representing lymphoma but extensive metastatic disease is also possible. There is bilateral hydronephrosis caused by extrinsic compression of the ureters. Bladder is displaced. Small esophageal hiatal hernia.   Electronically Signed   By: Lucienne Capers M.D.   On: 01/26/2014 02:36    Labs:  CBC:  Recent Labs  01/26/14 0300 01/28/14 0402  WBC 6.3 6.5  HGB 10.1* 8.6*  HCT 29.2* 25.3*  PLT 327 285    COAGS:  Recent Labs  01/28/14 0402  INR 1.19  APTT 35    BMP:  Recent Labs  01/26/14 0300 01/27/14 1224 01/28/14 0402 01/29/14 1245  NA 136* 136* 137 134*  K 4.4 4.5 4.6 4.6  CL 100 101 103 99  CO2 22 23  22 23   GLUCOSE 84 106* 96 117*  BUN 17 21 21 17   CALCIUM 9.2 8.9 8.8 8.8  CREATININE 1.56* 1.76* 1.83* 1.60*  GFRNONAA 44* 38* 37* 43*  GFRAA 51* 44* 42* 50*    LIVER FUNCTION TESTS:  Recent Labs  01/26/14 0300  BILITOT 0.3  AST 32  ALT 7  ALKPHOS 137*  PROT 7.4  ALBUMIN 3.6    Assessment and Plan: S/p bilat PCN's/pelvic mass bx 10/5 secondary to obstructive hydronephrosis, renal insuff; pathology from pelvic mass bx- prostate carcinoma; PSA >5000;  creat 1.60(1.83); send PCN urine for cx; await oncology consult; cont PCN's as per urology recs     I spent a total of 15 minutes face to face in clinical consultation/evaluation, greater than 50% of which was counseling/coordinating care for bilat perc nephrostomies.  Signed: Autumn Messing 01/29/2014, 3:28 PM

## 2014-01-29 NOTE — Progress Notes (Signed)
Patient ID: Jeff [name], male   DOB: October 16, 1945, 68 y.o.   MRN: [id number] [facility] PROGRESS NOTE  Jeff [name] MWU:[id number] DOB: [birth year] DOA: 01/25/2014 PCP: No primary provider on file.  Brief narrative: 68 year old male with past medical history of drug abuse, cocaine abuse, smoker. He presented to [facility] 01/25/2014 with complaints of back pain and occasional difficulty urination. He admitted to smoking crack cocaine. On admission, patient was found to have massive retroperitoneal and pelvic lymphadenopathy, diffuse sclerotic bone lesions and indeterminate small nodular changes in the lungs likely representing lymphoma but extensive metastatic disease is also possible. Also seen was bilateral hydronephrosis caused by extrinsic compression of the ureters, displaced bladder. PSA was elevated at more than 5000. GU has seen the patient in consultation. Patient underwent right and left percutaneous nephrostomy placement as well as pelvic mass biopsy 01/28/2014.  Assessment/Plan:   Principal Problem:  Back pain in the setting of sclerotic bone lesions secondary to possible prostate cancer / massive retroperitoneal and pelvic lymphadenopathy / possible lymphoma, metastatic disease / small nodular changes in lung  Abdominal CT (renal stone protocol) on admission showed massive retroperitoneal and pelvic lymphadenopathy, diffuse sclerotic bone lesions, and indeterminate small nodular changes in the lungs likely representing lymphoma but extensive metastatic disease is also possible. Also seen was bilateral hydronephrosis caused by extrinsic compression of the ureters and displaced bladder. Patient underwent right and left percutaneous nephrostomy placement as well as pelvic mass biopsy 01/28/2014. Considering elevated PSA level, more than 5000, these findings are highly concerning for advanced prostate cancer. Patient was seen and evaluated by urology. Per urology, if biopsy results turned out to  be prostate cancer they will manage this with androgen deprivation therapy and possible chemotherapy. Oncology consulted today, 01/29/2014. Please note, CT chest without the contrast done 01/27/2014 and it showed few scattered subpleural and more central foci of increased interstitial density which is nonspecific but could reflect pneumonitis. No evidence of metastases in lungs. Widespread sclerotic lesions again noted consistent with metastatic disease.   Active Problems:  Bilateral hydronephrosis / extrinsic compression of the ureters  Bilateral hydronephrosis / extrinsic compression of the ureters seen on CT abdomen. GU consulted and recommendation was for percutaneous nephrostomy tubes which were placed by IR 01/28/2014. Continue to monitor renal function. BMP this morning is pending. High alkaline phosphatase  Likely due to bone resorption. No evidence of liver metastases on CT scan.  Acute blood loss anemia / anemia of chronic disease  Hemoglobin was 10.1 on admission and on 01/28/2014 it was 8.6. This may be a result of recent procedure, nephrostomy placement as well malignancy. Continue to monitor CBC. No previous values for comparison. Acute renal failure  Likely secondary to bilateral hydronephrosis. Renal failure possibly chronic but we don't have previous values for comparison. Patient is status post bilateral percutaneous nephrostomy placement on 01/28/2014. Continue to monitor creatinine. History of drug abuse / smoker  Counseled on cessation.  Nicotine patch ordered. DVT Prophylaxis  Heparin subQ ordered.   Code Status: Full.  Family Communication: updated the patient at the bedside  Disposition Plan: Home when stable.    IV Access:   Peripheral IV Procedures and diagnostic studies:   Ct Renal Stone Study 01/26/2014 Massive retroperitoneal and pelvic lymphadenopathy, diffuse sclerotic bone lesions, and indeterminate small nodular changes in the lungs likely representing  lymphoma but extensive metastatic disease is also possible. There is bilateral hydronephrosis caused by extrinsic compression of the ureters. Bladder is displaced. Small esophageal hiatal hernia.  CT chest without the contrast 01/26/2014: There are a few scattered subpleural and more central foci of increased interstitial density which are nonspecific but could reflect pneumonitis. Subtle subpleural subcentimeter nodules are nonspecific and most likely inflammatory. There is no malignant-appearing parenchymal mass. There is no mediastinal nor hilar lymphadenopathy. There is no pleural nor pericardial effusion. 2. There are widespread sclerotic lesions of the bone consistent with metastatic disease.  Ir Perc Nephrostomy Left 01/28/2014  IMPRESSION: Successful ultrasound and fluoroscopic guided placement of bilateral 10 French PCNs.  PLAN: The patient could undergo attempted conversion of the bilateral percutaneous nephrostomy catheters to bilateral double-J ureteral stents in the next 1-2 weeks pending the ultimate plan of care regarding the biopsied pelvic masses.   Electronically Signed   By: Sandi Mariscal M.D.   On: 01/28/2014 13:36   Ir Perc Nephrostomy Right 01/28/2014   Successful ultrasound and fluoroscopic guided placement of bilateral 10 French PCNs.  PLAN: The patient could undergo attempted conversion of the bilateral percutaneous nephrostomy catheters to bilateral double-J ureteral stents in the next 1-2 weeks pending the ultimate plan of care regarding the biopsied pelvic masses.    Ir US Guide Bx Asp/drain 01/28/2014  Technically successful ultrasound guided core needle biopsy of mixed echogenic solid mass within the anterior aspect of the right lower pelvis as was demonstrated on preprocedural abdominal CT.    Ir US Guide Bx Asp/drain 01/28/2014   Successful ultrasound and fluoroscopic guided placement of bilateral 10 French PCNs.  PLAN: The patient could undergo attempted conversion of the bilateral  percutaneous nephrostomy catheters to bilateral double-J ureteral stents in the next 1-2 weeks pending the ultimate plan of care regarding the biopsied pelvic masses.     Medical Consultants:   Oncology IR  Urology (Dr. Cristela Felt)  Other Consultants:   Physical therapy   Anti-Infectives:   None    Leisa Lenz, MD  [facility] Pager [phone]  If 7PM-7AM, please contact night-coverage [email or web address] Password [email or web address] 01/29/2014, 12:07 PM   LOS: 4 days    HPI/Subjective: No acute overnight events.  Objective: Filed Vitals:   01/28/14 1258 01/28/14 1400 01/28/14 2136 01/29/14 0609  BP: 96/50 110/60 96/61 108/63  Pulse: 64 70 76 86  Temp: 97.5 F (36.4 C) 97.9 F (36.6 C) 98.5 F (36.9 C) 97.9 F (36.6 C)  TempSrc: Oral Oral Oral Oral  Resp: 14 16 20 20   Height:      Weight:      SpO2: 98% 97% 98% 98%    Intake/Output Summary (Last 24 hours) at 01/29/14 1207 Last data filed at 01/29/14 1040  Gross per 24 hour  Intake 1826.67 ml  Output   1400 ml  Net 426.67 ml    Exam:   General:  Pt is alert, follows commands appropriately, not in acute distress  Cardiovascular: Regular rate and rhythm, S1/S2, no murmurs  Respiratory: Clear to auscultation bilaterally, no wheezing, no crackles, no rhonchi  Abdomen: Soft, non tender, bilateral nephrostomy tubes in place with blood in the drain.  Extremities: No edema, pulses DP and PT palpable bilaterally  Neuro: Grossly nonfocal  Data Reviewed: Basic Metabolic Panel:  Recent Labs Lab 01/26/14 0300 01/27/14 1224 01/28/14 0402  NA 136* 136* 137  K 4.4 4.5 4.6  CL 100 101 103  CO2 22 23 22   GLUCOSE 84 106* 96  BUN 17 21 21   CREATININE 1.56* 1.76* 1.83*  CALCIUM 9.2 8.9 8.8   Liver Function Tests:  Recent Labs Lab 01/26/14 0300  AST 32  ALT 7  ALKPHOS 137*  BILITOT 0.3  PROT 7.4  ALBUMIN 3.6   No results found for this basename: LIPASE, AMYLASE,  in the last 168 hours No results found  for this basename: AMMONIA,  in the last 168 hours CBC:  Recent Labs Lab 01/26/14 0300 01/28/14 0402  WBC 6.3 6.5  NEUTROABS 4.4  --   HGB 10.1* 8.6*  HCT 29.2* 25.3*  MCV 86.6 88.5  PLT 327 285   Cardiac Enzymes: No results found for this basename: CKTOTAL, CKMB, CKMBINDEX, TROPONINI,  in the last 168 hours BNP: No components found with this basename: POCBNP,  CBG:  Recent Labs Lab 01/27/14 0732 01/28/14 0733 01/29/14 0747  GLUCAP 85 82 162*    No results found for this or any previous visit (from the past 240 hour(s)).   Scheduled Meds: . heparin  5,000 Units Subcutaneous 3 times per day  . nicotine  14 mg Transdermal Daily  . senna  1 tablet Oral BID  . zolpidem  5 mg Oral QHS   Continuous Infusions: . sodium chloride 100 mL/hr at 01/28/14 0910

## 2014-01-29 NOTE — Progress Notes (Signed)
Patient ID: Jeff [name], male   DOB: Oct 01, 1945, 68 y.o.   MRN: [id number] Patient without complaints.  PSA greater than 5000  Nephrostomy tubes draining.  Discussed with patient he may have been advanced prostate cancer. Percutaneous biopsy results pending. We discussed if the biopsy turns out to be prostate cancer we will manage the situation with androgen deprivation therapy and possibly chemotherapy. Oncology consulted. All questions answered. I did send a BMP and a testosterone. For androgen deprivation we could consider injections versus orchiectomy.

## 2014-01-30 DIAGNOSIS — C61 Malignant neoplasm of prostate: Secondary | ICD-10-CM

## 2014-01-30 HISTORY — DX: Malignant neoplasm of prostate: C61

## 2014-01-30 LAB — BASIC METABOLIC PANEL
Anion gap: 10 (ref 5–15)
BUN: 17 mg/dL (ref 6–23)
CALCIUM: 8.8 mg/dL (ref 8.4–10.5)
CO2: 23 mEq/L (ref 19–32)
CREATININE: 1.65 mg/dL — AB (ref 0.50–1.35)
Chloride: 102 mEq/L (ref 96–112)
GFR calc Af Amer: 48 mL/min — ABNORMAL LOW (ref >90)
GFR calc non Af Amer: 41 mL/min — ABNORMAL LOW (ref >90)
Glucose, Bld: 85 mg/dL (ref 70–99)
Potassium: 4.7 mEq/L (ref 3.7–5.3)
Sodium: 135 mEq/L — ABNORMAL LOW (ref 137–147)

## 2014-01-30 LAB — GLUCOSE, CAPILLARY: Glucose-Capillary: 90 mg/dL (ref 70–99)

## 2014-01-30 MED ORDER — DEGARELIX ACETATE 120 MG ~~LOC~~ SOLR
240.0000 mg | Freq: Once | SUBCUTANEOUS | Status: AC
  Administered 2014-01-30: 240 mg via SUBCUTANEOUS
  Filled 2014-01-30: qty 6

## 2014-01-30 MED ORDER — SODIUM CHLORIDE 0.9 % IJ SOLN
10.0000 mL | Freq: Two times a day (BID) | INTRAMUSCULAR | Status: DC
  Administered 2014-01-30 – 2014-02-01 (×4): 10 mL

## 2014-01-30 NOTE — Progress Notes (Signed)
Clinical Social Work Department BRIEF PSYCHOSOCIAL ASSESSMENT 01/30/2014  Patient:  Jeff [name], Jeff [name]     Account Number:  [id number]     Admit date:  01/25/2014  Clinical Social Worker:  Jeff [name]  Date/Time:  01/30/2014 10:00 AM  Referred by:  Physician  Date Referred:  01/30/2014 Referred for  Substance Abuse   Other Referral:   Interview type:  Patient Other interview type:    PSYCHOSOCIAL DATA Living Status:  FAMILY Admitted from facility:   Level of care:   Primary support name:  Jeff [name]/[phone] Primary support relationship to patient:  SIBLING Degree of support available:   pt reports strong support in his life is his boss    CURRENT CONCERNS Current Concerns  Substance Abuse  Adjustment to Illness   Other Concerns:    SOCIAL WORK ASSESSMENT / PLAN CSW received referral for current substance abuse. CSW also received notification from RN that pt biopsy results indicate new prostate cancer diagnosis.    CSW met with pt at bedside. CSW introduced self and explained role. Pt shared that he lives in [place] with his son. Pt discussed that support from pt son is lacking and that pt is more of a support to his son than pt son is to him. Pt shared that his boss at his job is a strong support to him and pt feels that he can turn to him with concerns. CSW provided positive reinsforcement to patient for identifying someone whom he can rely on. CSW discussed with pt concerns surrounding his current substance abuse. Pt positive for cocaine upon admission. Pt shared that his drug of choice is crack cocaine. Pt endorses smoking crack cocaine once per week. CSW provided psychoeducation surrounding negative impacts of substance abuse on health. Pt expressed undertanding. Pt shared that he was sober for 6 to 7 months, but then a friend came over a few weeks ago and that is when pt relapsed. CSW discussed resources including NA meeting and Outpatient treatment.  Pt shared that he was able to get sober on his own in the past, but is open to receiving resources from [facility]. CSW provided resources in [place] including Outpatient Treatment and NA meetings and CSW included Substance Abuse treatment resources for [place] area as well.    CSW explored with pt feelings surrounding current hospitalization and diagnosis. Pt shared that he knows that he still has a number of days in the hospital, but things are "moving along". CSW discussed with pt that CSW available if needed for support through hospitalization. Pt expressed understanding.    Pt appreciative of resources and wanted to read through resources once provided to him by this CSW.    No further social work needs identified at this time. Pt aware to notify RN if further social work needs arise.    CSW signing off.   Assessment/plan status:  No Further Intervention Required Other assessment/ plan:   Information/referral to community resources:   Substance Abuse Resources including outpatient substance abuse treatment in [place] and [employer] schedule in [place].    PATIENT'S/FAMILY'S RESPONSE TO PLAN OF CARE: Pt alert and oriented x 4. Pt friendly and appropriately engaged in the conversation. Pt likely to attempt to stop substance use on his own given history of getting sober on his own, but pt open to receiving resources.    CSW signing off. Please reconsult if further social work needs arise.    Jeff [name], MSW, [facility] [phone]

## 2014-01-30 NOTE — Progress Notes (Addendum)
Subjective: [name]. No complaints. Bilateral PCNs draining well.   Objective: Vital signs in last 24 hours: Temp:  [97.6 F (36.4 C)-98.4 F (36.9 C)] 97.6 F (36.4 C) (10/07 0402) Pulse Rate:  [75-86] 75 (10/07 0402) Resp:  [15-18] 16 (10/07 0402) BP: (99-108)/(52-63) 108/62 mmHg (10/07 0402) SpO2:  [98 %-99 %] 99 % (10/07 0402)  Intake/Output from previous day: 10/06 0701 - 10/07 0700 In: 2350 [P.O.:1560; I.V.:790] Out: 3540 [Urine:3540] Intake/Output this shift: Total I/O In: 240 [P.O.:240] Out: 300 [Urine:300]  Physical Exam:  Constitutional: Alert and oriented, No acute distress  Cardiovascular: Regular rate and rhythm, No JVD  Respiratory: Normal respiratory effort, Lungs clear bilaterally  GI: Abdomen is soft, nontender, nondistended, no abdominal masses  Genitourinary: B/L neph tubes in place, clear yellow urine. Normal male phallus, testes are descended bilaterally and non-tender and without masses, scrotum is normal in appearance without lesions or masses, perineum is normal on inspection.  Rectal: Normal sphincter tone. Prostate is markedly enlarged, firm, nodular, and asymmetric with right side > Left side  Neurologic: Grossly intact, no focal deficits  Psychiatric: Appropriate mood and affect   Lab Results: CBC Latest Ref Rng 01/28/2014 01/26/2014  WBC 4.0 - 10.5 K/uL 6.5 6.3  Hemoglobin 13.0 - 17.0 g/dL 8.6(L) 10.1(L)  Hematocrit 39.0 - 52.0 % 25.3(L) 29.2(L)  Platelets 150 - 400 K/uL 285 327      Recent Labs  01/29/14 1245 01/30/14 0344  NA 134* 135*  K 4.6 4.7  CL 99 102  CO2 23 23  GLUCOSE 117* 85  BUN 17 17  CREATININE 1.60* 1.65*  CALCIUM 8.8 8.8   Cr 10/3: 1.56  Studies/Results: Ir Perc Nephrostomy Left  01/28/2014   INDICATION: History of pelvic mass concerning for metastatic prostate cancer, now with bilateral obstructive hydronephrosis. Please place bilateral percutaneous nephrostomy catheters for renal function preservation  purposes.  EXAM: 1. ULTRASOUND GUIDANCE FOR PUNCTURE OF THE BILATERAL RENAL COLLECTING SYSTEMS 2. BILATERAL PERCUTANEOUS NEPHROSTOMY TUBE PLACEMENT.  COMPARISON:  CT chest, abdomen pelvis - 01/26/2014  MEDICATIONS: Ancef 2 g IV; The antibiotic was administered in an appropriate time frame prior to skin puncture.  ANESTHESIA/SEDATION: Fentanyl 150 mcg IV; Versed 3 mg IV  Total Moderate Sedation Time  56 minutes.  CONTRAST:  A total of 25 mL Isovue 300 was administered into the bilateral collecting systems  FLUOROSCOPY TIME:  4 minutes 0 seconds.  COMPLICATIONS: None immediate  PROCEDURE: The procedure, risks, benefits, and alternatives were explained to the patient. Questions regarding the procedure were encouraged and answered. The patient understands and consents to the procedure. A timeout was performed prior to the initiation of the procedure.  The bilateral flank region was prepped with Betadine in a sterile fashion, and a sterile drape was applied covering the operative field. A sterile gown and sterile gloves were used for the procedure. Local anesthesia was provided with 1% Lidocaine with epinephrine.  Initially, ultrasound was used to localize the left kidney. Under direct ultrasound guidance, a 21 gauge needle was advanced into the renal collecting system. An ultrasound image documentation was performed. Access within the collecting system was confirmed with the efflux of urine followed by contrast injection.  Over a Nitrex wire and under intermittent fluoroscopic guidance, the tract was dilated with an Accustick stent. Over a guide wire, a 10-French percutaneous nephrostomy catheter was advanced into the collecting system where the coil was formed and locked. Contrast was injected and several sport radiographs were obtained in various obliquities confirming access. The  catheter was secured at the skin with a Prolene retention suture and a gravity bag was placed.  The identical procedure was then performed  for the contralateral right kidney ultimately allowing successful placement of a 10 French percutaneous nephrostomy catheter with and coiled and locked within the right renal pelvis.  Dressings were placed. The patient tolerated the above procedures well without immediate postprocedural complication.  FINDINGS: Ultrasound scanning demonstrates a moderate to severely dilated left-sided renal collecting system, as was demonstrated on preprocedural abdominal CT. Under direct ultrasound guidance, a posterior inferior calix was targeted allowing advancement of an 10-French percutaneous nephrostomy catheter under intermittent fluoroscopic guidance. Contrast injection confirmed appropriate positioning.  Ultrasound scanning demonstrated mild dilatation of the right renal collecting system. Under direct ultrasound guidance, a posterior inferior cannulate was targeted ultimately allowing placement of a 10 French percutaneous nephrostomy catheter with end coiled and locked within all right renal pelvis. Contrast injection confirmed appropriate positioning.  IMPRESSION: Successful ultrasound and fluoroscopic guided placement of bilateral 10 French PCNs.  PLAN: The patient could undergo attempted conversion of the bilateral percutaneous nephrostomy catheters to bilateral double-J ureteral stents in the next 1-2 weeks pending the ultimate plan of care regarding the biopsied pelvic masses.   Electronically Signed   By: Sandi Mariscal M.D.   On: 01/28/2014 13:36   Ir Perc Nephrostomy Right  01/28/2014   INDICATION: History of pelvic mass concerning for metastatic prostate cancer, now with bilateral obstructive hydronephrosis. Please place bilateral percutaneous nephrostomy catheters for renal function preservation purposes.  EXAM: 1. ULTRASOUND GUIDANCE FOR PUNCTURE OF THE BILATERAL RENAL COLLECTING SYSTEMS 2. BILATERAL PERCUTANEOUS NEPHROSTOMY TUBE PLACEMENT.  COMPARISON:  CT chest, abdomen pelvis - 01/26/2014  MEDICATIONS: Ancef  2 g IV; The antibiotic was administered in an appropriate time frame prior to skin puncture.  ANESTHESIA/SEDATION: Fentanyl 150 mcg IV; Versed 3 mg IV  Total Moderate Sedation Time  56 minutes.  CONTRAST:  A total of 25 mL Isovue 300 was administered into the bilateral collecting systems  FLUOROSCOPY TIME:  4 minutes 0 seconds.  COMPLICATIONS: None immediate  PROCEDURE: The procedure, risks, benefits, and alternatives were explained to the patient. Questions regarding the procedure were encouraged and answered. The patient understands and consents to the procedure. A timeout was performed prior to the initiation of the procedure.  The bilateral flank region was prepped with Betadine in a sterile fashion, and a sterile drape was applied covering the operative field. A sterile gown and sterile gloves were used for the procedure. Local anesthesia was provided with 1% Lidocaine with epinephrine.  Initially, ultrasound was used to localize the left kidney. Under direct ultrasound guidance, a 21 gauge needle was advanced into the renal collecting system. An ultrasound image documentation was performed. Access within the collecting system was confirmed with the efflux of urine followed by contrast injection.  Over a Nitrex wire and under intermittent fluoroscopic guidance, the tract was dilated with an Accustick stent. Over a guide wire, a 10-French percutaneous nephrostomy catheter was advanced into the collecting system where the coil was formed and locked. Contrast was injected and several sport radiographs were obtained in various obliquities confirming access. The catheter was secured at the skin with a Prolene retention suture and a gravity bag was placed.  The identical procedure was then performed for the contralateral right kidney ultimately allowing successful placement of a 10 French percutaneous nephrostomy catheter with and coiled and locked within the right renal pelvis.  Dressings were placed. The patient  tolerated the  above procedures well without immediate postprocedural complication.  FINDINGS: Ultrasound scanning demonstrates a moderate to severely dilated left-sided renal collecting system, as was demonstrated on preprocedural abdominal CT. Under direct ultrasound guidance, a posterior inferior calix was targeted allowing advancement of an 10-French percutaneous nephrostomy catheter under intermittent fluoroscopic guidance. Contrast injection confirmed appropriate positioning.  Ultrasound scanning demonstrated mild dilatation of the right renal collecting system. Under direct ultrasound guidance, a posterior inferior cannulate was targeted ultimately allowing placement of a 10 French percutaneous nephrostomy catheter with end coiled and locked within all right renal pelvis. Contrast injection confirmed appropriate positioning.  IMPRESSION: Successful ultrasound and fluoroscopic guided placement of bilateral 10 French PCNs.  PLAN: The patient could undergo attempted conversion of the bilateral percutaneous nephrostomy catheters to bilateral double-J ureteral stents in the next 1-2 weeks pending the ultimate plan of care regarding the biopsied pelvic masses.   Electronically Signed   By: Sandi Mariscal M.D.   On: 01/28/2014 13:36   Ir US Guide Bx Asp/drain  01/28/2014   INDICATION: Pelvic mass and retroperitoneal adenopathy with multiple sclerotic osseous lesions worrisome for metastatic prostate cancer. Please perform ultrasound-guided pelvic mass biopsy for tissue diagnostic purposes.  EXAM: ULTRASOUND GUIDED RIGHT-SIDED PELVIC MASS BIOPSY  COMPARISON:  CT of the chest, abdomen pelvis - 01/26/2014  MEDICATIONS: Fentanyl 50 mcg IV; Versed 1 mg IV  ANESTHESIA/SEDATION: Total Moderate Sedation time  10 minutes  COMPLICATIONS: None immediate  PROCEDURE: Informed written consent was obtained from the patient after a discussion of the risks, benefits and alternatives to treatment. The patient understands and consents  the procedure. A timeout was performed prior to the initiation of the procedure.  Ultrasound scanning was performed of the groin demonstrating a mixed echogenic solid mass within the right lower pelvis compatible with the mass seen on preceding CT of the abdomen and pelvis.  The skin overlying the anterior aspect of the right lower abdomen/pelvis was prepped and draped in usual sterile fashion. The overlying soft tissues were anesthetized with 1% lidocaine with epinephrine. A 17 gauge, 6.8 cm co-axial needle was advanced into a peripheral aspect of the lesion. This was followed by 4 core biopsies with an 18 gauge core device under direct ultrasound guidance.  The co-axial needle was removed and hemostasis was obtained with manual compression. Post procedural scanning was negative for definitive area of hemorrhage or additional complication. A dressing was placed. The patient tolerated the procedure well without immediate post procedural complication.  IMPRESSION: Technically successful ultrasound guided core needle biopsy of mixed echogenic solid mass within the anterior aspect of the right lower pelvis as was demonstrated on preprocedural abdominal CT.   Electronically Signed   By: Sandi Mariscal M.D.   On: 01/28/2014 13:42   Ir US Guide Bx Asp/drain  01/28/2014   INDICATION: History of pelvic mass concerning for metastatic prostate cancer, now with bilateral obstructive hydronephrosis. Please place bilateral percutaneous nephrostomy catheters for renal function preservation purposes.  EXAM: 1. ULTRASOUND GUIDANCE FOR PUNCTURE OF THE BILATERAL RENAL COLLECTING SYSTEMS 2. BILATERAL PERCUTANEOUS NEPHROSTOMY TUBE PLACEMENT.  COMPARISON:  CT chest, abdomen pelvis - 01/26/2014  MEDICATIONS: Ancef 2 g IV; The antibiotic was administered in an appropriate time frame prior to skin puncture.  ANESTHESIA/SEDATION: Fentanyl 150 mcg IV; Versed 3 mg IV  Total Moderate Sedation Time  56 minutes.  CONTRAST:  A total of 25 mL  Isovue 300 was administered into the bilateral collecting systems  FLUOROSCOPY TIME:  4 minutes 0 seconds.  COMPLICATIONS: None immediate  PROCEDURE: The procedure, risks, benefits, and alternatives were explained to the patient. Questions regarding the procedure were encouraged and answered. The patient understands and consents to the procedure. A timeout was performed prior to the initiation of the procedure.  The bilateral flank region was prepped with Betadine in a sterile fashion, and a sterile drape was applied covering the operative field. A sterile gown and sterile gloves were used for the procedure. Local anesthesia was provided with 1% Lidocaine with epinephrine.  Initially, ultrasound was used to localize the left kidney. Under direct ultrasound guidance, a 21 gauge needle was advanced into the renal collecting system. An ultrasound image documentation was performed. Access within the collecting system was confirmed with the efflux of urine followed by contrast injection.  Over a Nitrex wire and under intermittent fluoroscopic guidance, the tract was dilated with an Accustick stent. Over a guide wire, a 10-French percutaneous nephrostomy catheter was advanced into the collecting system where the coil was formed and locked. Contrast was injected and several sport radiographs were obtained in various obliquities confirming access. The catheter was secured at the skin with a Prolene retention suture and a gravity bag was placed.  The identical procedure was then performed for the contralateral right kidney ultimately allowing successful placement of a 10 French percutaneous nephrostomy catheter with and coiled and locked within the right renal pelvis.  Dressings were placed. The patient tolerated the above procedures well without immediate postprocedural complication.  FINDINGS: Ultrasound scanning demonstrates a moderate to severely dilated left-sided renal collecting system, as was demonstrated on  preprocedural abdominal CT. Under direct ultrasound guidance, a posterior inferior calix was targeted allowing advancement of an 10-French percutaneous nephrostomy catheter under intermittent fluoroscopic guidance. Contrast injection confirmed appropriate positioning.  Ultrasound scanning demonstrated mild dilatation of the right renal collecting system. Under direct ultrasound guidance, a posterior inferior cannulate was targeted ultimately allowing placement of a 10 French percutaneous nephrostomy catheter with end coiled and locked within all right renal pelvis. Contrast injection confirmed appropriate positioning.  IMPRESSION: Successful ultrasound and fluoroscopic guided placement of bilateral 10 French PCNs.  PLAN: The patient could undergo attempted conversion of the bilateral percutaneous nephrostomy catheters to bilateral double-J ureteral stents in the next 1-2 weeks pending the ultimate plan of care regarding the biopsied pelvic masses.   Electronically Signed   By: Sandi Mariscal M.D.   On: 01/28/2014 13:36   Ir US Guide Bx Asp/drain  01/28/2014   INDICATION: History of pelvic mass concerning for metastatic prostate cancer, now with bilateral obstructive hydronephrosis. Please place bilateral percutaneous nephrostomy catheters for renal function preservation purposes.  EXAM: 1. ULTRASOUND GUIDANCE FOR PUNCTURE OF THE BILATERAL RENAL COLLECTING SYSTEMS 2. BILATERAL PERCUTANEOUS NEPHROSTOMY TUBE PLACEMENT.  COMPARISON:  CT chest, abdomen pelvis - 01/26/2014  MEDICATIONS: Ancef 2 g IV; The antibiotic was administered in an appropriate time frame prior to skin puncture.  ANESTHESIA/SEDATION: Fentanyl 150 mcg IV; Versed 3 mg IV  Total Moderate Sedation Time  56 minutes.  CONTRAST:  A total of 25 mL Isovue 300 was administered into the bilateral collecting systems  FLUOROSCOPY TIME:  4 minutes 0 seconds.  COMPLICATIONS: None immediate  PROCEDURE: The procedure, risks, benefits, and alternatives were explained  to the patient. Questions regarding the procedure were encouraged and answered. The patient understands and consents to the procedure. A timeout was performed prior to the initiation of the procedure.  The bilateral flank region was prepped with Betadine in a sterile fashion, and a sterile drape was applied  covering the operative field. A sterile gown and sterile gloves were used for the procedure. Local anesthesia was provided with 1% Lidocaine with epinephrine.  Initially, ultrasound was used to localize the left kidney. Under direct ultrasound guidance, a 21 gauge needle was advanced into the renal collecting system. An ultrasound image documentation was performed. Access within the collecting system was confirmed with the efflux of urine followed by contrast injection.  Over a Nitrex wire and under intermittent fluoroscopic guidance, the tract was dilated with an Accustick stent. Over a guide wire, a 10-French percutaneous nephrostomy catheter was advanced into the collecting system where the coil was formed and locked. Contrast was injected and several sport radiographs were obtained in various obliquities confirming access. The catheter was secured at the skin with a Prolene retention suture and a gravity bag was placed.  The identical procedure was then performed for the contralateral right kidney ultimately allowing successful placement of a 10 French percutaneous nephrostomy catheter with and coiled and locked within the right renal pelvis.  Dressings were placed. The patient tolerated the above procedures well without immediate postprocedural complication.  FINDINGS: Ultrasound scanning demonstrates a moderate to severely dilated left-sided renal collecting system, as was demonstrated on preprocedural abdominal CT. Under direct ultrasound guidance, a posterior inferior calix was targeted allowing advancement of an 10-French percutaneous nephrostomy catheter under intermittent fluoroscopic guidance.  Contrast injection confirmed appropriate positioning.  Ultrasound scanning demonstrated mild dilatation of the right renal collecting system. Under direct ultrasound guidance, a posterior inferior cannulate was targeted ultimately allowing placement of a 10 French percutaneous nephrostomy catheter with end coiled and locked within all right renal pelvis. Contrast injection confirmed appropriate positioning.  IMPRESSION: Successful ultrasound and fluoroscopic guided placement of bilateral 10 French PCNs.  PLAN: The patient could undergo attempted conversion of the bilateral percutaneous nephrostomy catheters to bilateral double-J ureteral stents in the next 1-2 weeks pending the ultimate plan of care regarding the biopsied pelvic masses.   Electronically Signed   By: Sandi Mariscal M.D.   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 He also has sclerotic bone lesions and lung lesions. Drastically elevated PSA (>5000) and firm, fixed nodular prostate, and biopsy confirmed bulky mets to retroperitoneum. Suspect urinary symptoms is due to mass effect of compression on bladder.   -- continue bilateral PCNs as indwelling ureteral stents are unlikely to be successful in the long term. Trend Cr. Seems to have reached a plateau at 1.6 -- Med onc consultation -- Discussed forms of androgen deprivation and patient favors bilateral orchiectomy. Discussed risks and benefits of this and he wishes to proceed as soon as possible.  Ordered degarelix 240mg  x 1 prior to discharge. Will schedule bilateral orchiectomy as outpatient.  Recommend discharge with Ca & Vit D supplementation.   LOS: 5 days   [name], Jeff C 01/30/2014, 11:27 AM  Patient was personally seen and examined. I discussed the patient with Dr. Wynetta Emery and agree with this assessment and plan. Using the understanding prostate cancer booklet I went over the nature risks benefits alternatives to androgen deprivation. We discussed androgen deprivation could be done with injections or orchiectomy. We will start firmagon. In long run patient would prefer orchiectomy. We  could also consider Xgeva in outpatient. Discussed with patient to start Calcium, Vit D supplement. Oncology consult pending to consider chemotherapy options.  Patient will need nephrostomy to changes in 6 weeks.  If tumor volume significantly shrinks with treatment we could consider internalization of the nephrostomy tubes 2 ureteral stents or removal of the nephrostomy tubes.

## 2014-01-30 NOTE — Plan of Care (Signed)
Problem: Phase II Progression Outcomes Goal: Progress activity as tolerated unless otherwise ordered Outcome: Completed/Met Date Met:  01/30/14 Patient ambulating independently.

## 2014-01-30 NOTE — Progress Notes (Signed)
Progress Note   Jeff [name] MWU:[id number] DOB: April 14, 1946 DOA: 01/25/2014 PCP: No primary provider on file.   Brief Narrative:   Jeff [name] is an 67 y.o. male with a PMH of polysubstance abuse including cocaine, tobacco abuse who was admitted on 01/25/14 with a chief complaint of back pain and dysuria. Upon initial evaluation in the ED, a CT scan of the abdomen showed massive retroperitoneal and pelvic lymphadenopathy with diffuse sclerotic bone lesions concerning for malignancy as well as bilateral hydronephrosis. PSA was markedly elevated. Patient was seen by urology in consultation and underwent bilateral percutaneous nephrostomy tube placement as well as biopsy of a pelvic mass with pathology positive for prostate adenocarcinoma.  Assessment/Plan:   Principal Problem:   Prostate cancer metastatic to multiple sites  Biopsy confirmed with metastasis to bone and pelvic organs.  Being followed by urologist with recommendations to proceed with firmagon and possible orchiectomy.  Oncology consultation pending.  Active Problems:   Bone lesion / elevated alkaline phosphatase level  Consistent with bone metastasis.    Acute renal failure / bilateral hydronephrosis  Secondary to obstructive uropathy, status post percutaneous nephrostomy tube placement on 01/28/14.  Continue to hydrate and monitor for recovery of renal function.    Anemia of chronic disease  Hemoglobin stable with no current indication for transfusion. Initial drop in hemoglobin likely dilutional.    History of cocaine abuse  Counseled.    Tobacco abuse  Counseled. Nicotine patch ordered.    DVT Prophylaxis  Continue subcutaneous heparin.  Code Status: Full. Family Communication: No family at the bedside Disposition Plan: Home when stable.   IV Access:    Peripheral IV   Procedures and diagnostic studies:   Ct Renal Stone Study 01/26/2014 Massive retroperitoneal and pelvic  lymphadenopathy, diffuse sclerotic bone lesions, and indeterminate small nodular changes in the lungs likely representing lymphoma but extensive metastatic disease is also possible. There is bilateral hydronephrosis caused by extrinsic compression of the ureters. Bladder is displaced. Small esophageal hiatal hernia.   CT chest without the contrast 01/26/2014: There are a few scattered subpleural and more central foci of increased interstitial density which are nonspecific but could reflect pneumonitis. Subtle subpleural subcentimeter nodules are nonspecific and most likely inflammatory. There is no malignant-appearing parenchymal mass. There is no mediastinal nor hilar lymphadenopathy. There is no pleural nor pericardial effusion. 2. There are widespread sclerotic lesions of the bone consistent with metastatic disease  Ir Perc Nephrostomy Left 01/28/2014 IMPRESSION: Successful ultrasound and fluoroscopic guided placement of bilateral 10 French PCNs. PLAN: The patient could undergo attempted conversion of the bilateral percutaneous nephrostomy catheters to bilateral double-J ureteral stents in the next 1-2 weeks pending the ultimate plan of care regarding the biopsied pelvic masses. Electronically Signed By: Sandi Mariscal M.D. On: 01/28/2014 13:36   Ir Perc Nephrostomy Right 01/28/2014 Successful ultrasound and fluoroscopic guided placement of bilateral 10 French PCNs. PLAN: The patient could undergo attempted conversion of the bilateral percutaneous nephrostomy catheters to bilateral double-J ureteral stents in the next 1-2 weeks pending the ultimate plan of care regarding the biopsied pelvic masses  Ir US Guide Bx Asp/drain 01/28/2014 Technically successful ultrasound guided core needle biopsy of mixed echogenic solid mass within the anterior aspect of the right lower pelvis as was demonstrated on preprocedural abdominal CT.   Ir US Guide Bx Asp/drain 01/28/2014 Successful ultrasound and fluoroscopic guided  placement of bilateral 10 French PCNs. PLAN: The patient could undergo attempted conversion of the bilateral percutaneous nephrostomy catheters to  bilateral double-J ureteral stents in the next 1-2 weeks pending the ultimate plan of care regarding the biopsied pelvic masses.   Medical Consultants:    Dr. Festus Aloe, Urology.  IR  Oncology.   Other Consultants:    None.   Anti-Infectives:    None.  Subjective:   Bertram Haddix says he feels a little sore from his percutaneous tube placement, but otherwise without any complaints. No nausea or vomiting. Appetite is good.  Objective:    Filed Vitals:   01/29/14 1305 01/29/14 2054 01/30/14 0402 01/30/14 1518  BP: 106/63 99/52 108/62 102/58  Pulse: 75 86 75 72  Temp: 98.4 F (36.9 C) 98.1 F (36.7 C) 97.6 F (36.4 C) 98.1 F (36.7 C)  TempSrc: Oral Oral Oral Oral  Resp: 18 15 16 17   Height:      Weight:      SpO2: 99% 98% 99% 98%    Intake/Output Summary (Last 24 hours) at 01/30/14 1624 Last data filed at 01/30/14 1519  Gross per 24 hour  Intake   1200 ml  Output   2640 ml  Net  -1440 ml    Exam: Gen:  NAD Cardiovascular:  RRR, No M/R/G Respiratory:  Lungs CTAB Gastrointestinal:  Abdomen soft, NT/ND, + BS GU: Nephrostomy tubes draining bloody drainage. Extremities:  No C/E/C   Data Reviewed:    Labs: Basic Metabolic Panel:  Recent Labs Lab 01/26/14 0300 01/27/14 1224 01/28/14 0402 01/29/14 1245 01/30/14 0344  NA 136* 136* 137 134* 135*  K 4.4 4.5 4.6 4.6 4.7  CL 100 101 103 99 102  CO2 22 23 22 23 23   GLUCOSE 84 106* 96 117* 85  BUN 17 21 21 17 17   CREATININE 1.56* 1.76* 1.83* 1.60* 1.65*  CALCIUM 9.2 8.9 8.8 8.8 8.8   GFR Estimated Creatinine Clearance: 42.1 ml/min (by C-G formula based on Cr of 1.65). Liver Function Tests:  Recent Labs Lab 01/26/14 0300  AST 32  ALT 7  ALKPHOS 137*  BILITOT 0.3  PROT 7.4  ALBUMIN 3.6   Coagulation profile  Recent Labs Lab  01/28/14 0402  INR 1.19    CBC:  Recent Labs Lab 01/26/14 0300 01/28/14 0402  WBC 6.3 6.5  NEUTROABS 4.4  --   HGB 10.1* 8.6*  HCT 29.2* 25.3*  MCV 86.6 88.5  PLT 327 285   CBG:  Recent Labs Lab 01/27/14 0732 01/28/14 0733 01/29/14 0747 01/30/14 0725  GLUCAP 85 82 162* 90   Sepsis Labs:  Recent Labs Lab 01/26/14 0300 01/28/14 0402  WBC 6.3 6.5   Microbiology No results found for this or any previous visit (from the past 240 hour(s)).   Medications:   . heparin  5,000 Units Subcutaneous 3 times per day  . nicotine  14 mg Transdermal Daily  . senna  1 tablet Oral BID  . zolpidem  5 mg Oral QHS   Continuous Infusions: . sodium chloride 100 mL/hr at 01/30/14 1351    Time spent: 25 minutes.   LOS: 5 days   [facility] Pager [phone]. If unable to reach me by pager, please call my cell phone at [phone].  *Please refer to [email or web address], password [email or web address] to get updated schedule on who will round on this patient, as hospitalists switch teams weekly. If 7PM-7AM, please contact night-coverage at [email or web address], password [email or web address] for any overnight needs.  01/30/2014, 4:24 PM    Information printed out and reviewed with the patient/family:     In an  effort to keep you and your family informed about your hospital stay, I am providing you with this information sheet. If you or your family have any questions, please do not hesitate to have the nursing staff page me to set up a meeting time.  Also note that the hospitalist doctors typically change on Tuesdays or Wednesdays to a different hospitalist doctor.  Hutson Luft 01/30/2014 5 (Number of days in the hospital)  Treatment team:  Dr. Jacquelynn Cree, Hospitalist (Internist)  Dr. Wynetta Emery and Dr. Junious Silk, Urologist  Pertinent labs / studies:  Biopsy shows prostate cancer.  Your kidney function is impaired, likely from outflow obstruction from masses in your pelvis pressing on the  tubes that drain your kidneys.  Principle Diagnosis: Prostate cancer with hydronephrosis (changes in the kidneys from back pressure), with kidney dysfunction.  Plan for today:  Continue to monitor kidney function for recovery, status post placement of nephrostomy tubes to keep the tubes that drain your kidneys open.  Oncology consultation.  Followup with decision regarding treatment options for androgen deprivation therapy (removal of the testicles versus medications that block testosterone).  Anticipated discharge date:  Depends on what decision you make regarding further treatment.

## 2014-01-31 ENCOUNTER — Encounter (HOSPITAL_COMMUNITY): Payer: Self-pay | Admitting: Internal Medicine

## 2014-01-31 DIAGNOSIS — Z5900 Homelessness unspecified: Secondary | ICD-10-CM

## 2014-01-31 DIAGNOSIS — C7951 Secondary malignant neoplasm of bone: Secondary | ICD-10-CM

## 2014-01-31 HISTORY — DX: Homelessness unspecified: Z59.00

## 2014-01-31 LAB — URINE CULTURE
Colony Count: NO GROWTH
Colony Count: NO GROWTH
Culture: NO GROWTH
Culture: NO GROWTH

## 2014-01-31 LAB — GLUCOSE, CAPILLARY: Glucose-Capillary: 139 mg/dL — ABNORMAL HIGH (ref 70–99)

## 2014-01-31 LAB — BASIC METABOLIC PANEL
Anion gap: 10 (ref 5–15)
BUN: 17 mg/dL (ref 6–23)
CHLORIDE: 102 meq/L (ref 96–112)
CO2: 23 mEq/L (ref 19–32)
CREATININE: 1.54 mg/dL — AB (ref 0.50–1.35)
Calcium: 8.8 mg/dL (ref 8.4–10.5)
GFR calc non Af Amer: 45 mL/min — ABNORMAL LOW (ref >90)
GFR, EST AFRICAN AMERICAN: 52 mL/min — AB (ref >90)
Glucose, Bld: 98 mg/dL (ref 70–99)
Potassium: 4.8 mEq/L (ref 3.7–5.3)
SODIUM: 135 meq/L — AB (ref 137–147)

## 2014-01-31 MED ORDER — CALCIUM CARBONATE-VITAMIN D 500-200 MG-UNIT PO TABS
1.0000 | ORAL_TABLET | Freq: Two times a day (BID) | ORAL | Status: DC
  Administered 2014-01-31 – 2014-02-01 (×2): 1 via ORAL
  Filled 2014-01-31 (×3): qty 1

## 2014-01-31 MED ORDER — CALCIUM CARBONATE-VITAMIN D 500-200 MG-UNIT PO TABS: 1.0000 | ORAL_TABLET | Freq: Two times a day (BID) | ORAL | Status: DC

## 2014-01-31 NOTE — Progress Notes (Signed)
Patient ID: Jeff [name], male   DOB: 1945/07/06, 68 y.o.   MRN: [id number]   Referring Physician(s): [facility]/Dr. Junious Silk  Subjective: pt sitting up in chair; no new c/o ; had minor nose bleed earlier today    Allergies: Review of patient's allergies indicates no known allergies.  Medications: Prior to Admission medications   Medication Sig Start Date End Date Taking? Authorizing Provider  calcium-vitamin D (OSCAL 500/200 D-3) 500-200 MG-UNIT per tablet Take 1 tablet by mouth 2 (two) times daily. 01/31/14   Venetia Maxon Rama, MD    Review of Systems  see above  Vital Signs: BP 114/60  Pulse 81  Temp(Src) 98.7 F (37.1 C) (Oral)  Resp 18  Ht 6' (1.829 m)  Wt 151 lb 0.2 oz (68.5 kg)  BMI 20.48 kg/m2  SpO2 98%  Physical Exam pt awake/alert; bilat PCN's intact, outputs 600 cc's (R), 400 CC'S (L) blood tinged urine; cx's neg  Imaging: Ir Perc Nephrostomy Left  01/28/2014   INDICATION: History of pelvic mass concerning for metastatic prostate cancer, now with bilateral obstructive hydronephrosis. Please place bilateral percutaneous nephrostomy catheters for renal function preservation purposes.  EXAM: 1. ULTRASOUND GUIDANCE FOR PUNCTURE OF THE BILATERAL RENAL COLLECTING SYSTEMS 2. BILATERAL PERCUTANEOUS NEPHROSTOMY TUBE PLACEMENT.  COMPARISON:  CT chest, abdomen pelvis - 01/26/2014  MEDICATIONS: Ancef 2 g IV; The antibiotic was administered in an appropriate time frame prior to skin puncture.  ANESTHESIA/SEDATION: Fentanyl 150 mcg IV; Versed 3 mg IV  Total Moderate Sedation Time  56 minutes.  CONTRAST:  A total of 25 mL Isovue 300 was administered into the bilateral collecting systems  FLUOROSCOPY TIME:  4 minutes 0 seconds.  COMPLICATIONS: None immediate  PROCEDURE: The procedure, risks, benefits, and alternatives were explained to the patient. Questions regarding the procedure were encouraged and answered. The patient understands and consents to the procedure. A timeout was performed  prior to the initiation of the procedure.  The bilateral flank region was prepped with Betadine in a sterile fashion, and a sterile drape was applied covering the operative field. A sterile gown and sterile gloves were used for the procedure. Local anesthesia was provided with 1% Lidocaine with epinephrine.  Initially, ultrasound was used to localize the left kidney. Under direct ultrasound guidance, a 21 gauge needle was advanced into the renal collecting system. An ultrasound image documentation was performed. Access within the collecting system was confirmed with the efflux of urine followed by contrast injection.  Over a Nitrex wire and under intermittent fluoroscopic guidance, the tract was dilated with an Accustick stent. Over a guide wire, a 10-French percutaneous nephrostomy catheter was advanced into the collecting system where the coil was formed and locked. Contrast was injected and several sport radiographs were obtained in various obliquities confirming access. The catheter was secured at the skin with a Prolene retention suture and a gravity bag was placed.  The identical procedure was then performed for the contralateral right kidney ultimately allowing successful placement of a 10 French percutaneous nephrostomy catheter with and coiled and locked within the right renal pelvis.  Dressings were placed. The patient tolerated the above procedures well without immediate postprocedural complication.  FINDINGS: Ultrasound scanning demonstrates a moderate to severely dilated left-sided renal collecting system, as was demonstrated on preprocedural abdominal CT. Under direct ultrasound guidance, a posterior inferior calix was targeted allowing advancement of an 10-French percutaneous nephrostomy catheter under intermittent fluoroscopic guidance. Contrast injection confirmed appropriate positioning.  Ultrasound scanning demonstrated mild dilatation of the right renal collecting  system. Under direct ultrasound  guidance, a posterior inferior cannulate was targeted ultimately allowing placement of a 10 French percutaneous nephrostomy catheter with end coiled and locked within all right renal pelvis. Contrast injection confirmed appropriate positioning.  IMPRESSION: Successful ultrasound and fluoroscopic guided placement of bilateral 10 French PCNs.  PLAN: The patient could undergo attempted conversion of the bilateral percutaneous nephrostomy catheters to bilateral double-J ureteral stents in the next 1-2 weeks pending the ultimate plan of care regarding the biopsied pelvic masses.   Electronically Signed   By: Sandi Mariscal M.D.   On: 01/28/2014 13:36   Ir Perc Nephrostomy Right  01/28/2014   INDICATION: History of pelvic mass concerning for metastatic prostate cancer, now with bilateral obstructive hydronephrosis. Please place bilateral percutaneous nephrostomy catheters for renal function preservation purposes.  EXAM: 1. ULTRASOUND GUIDANCE FOR PUNCTURE OF THE BILATERAL RENAL COLLECTING SYSTEMS 2. BILATERAL PERCUTANEOUS NEPHROSTOMY TUBE PLACEMENT.  COMPARISON:  CT chest, abdomen pelvis - 01/26/2014  MEDICATIONS: Ancef 2 g IV; The antibiotic was administered in an appropriate time frame prior to skin puncture.  ANESTHESIA/SEDATION: Fentanyl 150 mcg IV; Versed 3 mg IV  Total Moderate Sedation Time  56 minutes.  CONTRAST:  A total of 25 mL Isovue 300 was administered into the bilateral collecting systems  FLUOROSCOPY TIME:  4 minutes 0 seconds.  COMPLICATIONS: None immediate  PROCEDURE: The procedure, risks, benefits, and alternatives were explained to the patient. Questions regarding the procedure were encouraged and answered. The patient understands and consents to the procedure. A timeout was performed prior to the initiation of the procedure.  The bilateral flank region was prepped with Betadine in a sterile fashion, and a sterile drape was applied covering the operative field. A sterile gown and sterile gloves were  used for the procedure. Local anesthesia was provided with 1% Lidocaine with epinephrine.  Initially, ultrasound was used to localize the left kidney. Under direct ultrasound guidance, a 21 gauge needle was advanced into the renal collecting system. An ultrasound image documentation was performed. Access within the collecting system was confirmed with the efflux of urine followed by contrast injection.  Over a Nitrex wire and under intermittent fluoroscopic guidance, the tract was dilated with an Accustick stent. Over a guide wire, a 10-French percutaneous nephrostomy catheter was advanced into the collecting system where the coil was formed and locked. Contrast was injected and several sport radiographs were obtained in various obliquities confirming access. The catheter was secured at the skin with a Prolene retention suture and a gravity bag was placed.  The identical procedure was then performed for the contralateral right kidney ultimately allowing successful placement of a 10 French percutaneous nephrostomy catheter with and coiled and locked within the right renal pelvis.  Dressings were placed. The patient tolerated the above procedures well without immediate postprocedural complication.  FINDINGS: Ultrasound scanning demonstrates a moderate to severely dilated left-sided renal collecting system, as was demonstrated on preprocedural abdominal CT. Under direct ultrasound guidance, a posterior inferior calix was targeted allowing advancement of an 10-French percutaneous nephrostomy catheter under intermittent fluoroscopic guidance. Contrast injection confirmed appropriate positioning.  Ultrasound scanning demonstrated mild dilatation of the right renal collecting system. Under direct ultrasound guidance, a posterior inferior cannulate was targeted ultimately allowing placement of a 10 French percutaneous nephrostomy catheter with end coiled and locked within all right renal pelvis. Contrast injection confirmed  appropriate positioning.  IMPRESSION: Successful ultrasound and fluoroscopic guided placement of bilateral 10 French PCNs.  PLAN: The patient could undergo attempted conversion of  the bilateral percutaneous nephrostomy catheters to bilateral double-J ureteral stents in the next 1-2 weeks pending the ultimate plan of care regarding the biopsied pelvic masses.   Electronically Signed   By: Sandi Mariscal M.D.   On: 01/28/2014 13:36   Ir US Guide Bx Asp/drain  01/28/2014   INDICATION: Pelvic mass and retroperitoneal adenopathy with multiple sclerotic osseous lesions worrisome for metastatic prostate cancer. Please perform ultrasound-guided pelvic mass biopsy for tissue diagnostic purposes.  EXAM: ULTRASOUND GUIDED RIGHT-SIDED PELVIC MASS BIOPSY  COMPARISON:  CT of the chest, abdomen pelvis - 01/26/2014  MEDICATIONS: Fentanyl 50 mcg IV; Versed 1 mg IV  ANESTHESIA/SEDATION: Total Moderate Sedation time  10 minutes  COMPLICATIONS: None immediate  PROCEDURE: Informed written consent was obtained from the patient after a discussion of the risks, benefits and alternatives to treatment. The patient understands and consents the procedure. A timeout was performed prior to the initiation of the procedure.  Ultrasound scanning was performed of the groin demonstrating a mixed echogenic solid mass within the right lower pelvis compatible with the mass seen on preceding CT of the abdomen and pelvis.  The skin overlying the anterior aspect of the right lower abdomen/pelvis was prepped and draped in usual sterile fashion. The overlying soft tissues were anesthetized with 1% lidocaine with epinephrine. A 17 gauge, 6.8 cm co-axial needle was advanced into a peripheral aspect of the lesion. This was followed by 4 core biopsies with an 18 gauge core device under direct ultrasound guidance.  The co-axial needle was removed and hemostasis was obtained with manual compression. Post procedural scanning was negative for definitive area of  hemorrhage or additional complication. A dressing was placed. The patient tolerated the procedure well without immediate post procedural complication.  IMPRESSION: Technically successful ultrasound guided core needle biopsy of mixed echogenic solid mass within the anterior aspect of the right lower pelvis as was demonstrated on preprocedural abdominal CT.   Electronically Signed   By: Sandi Mariscal M.D.   On: 01/28/2014 13:42   Ir US Guide Bx Asp/drain  01/28/2014   INDICATION: History of pelvic mass concerning for metastatic prostate cancer, now with bilateral obstructive hydronephrosis. Please place bilateral percutaneous nephrostomy catheters for renal function preservation purposes.  EXAM: 1. ULTRASOUND GUIDANCE FOR PUNCTURE OF THE BILATERAL RENAL COLLECTING SYSTEMS 2. BILATERAL PERCUTANEOUS NEPHROSTOMY TUBE PLACEMENT.  COMPARISON:  CT chest, abdomen pelvis - 01/26/2014  MEDICATIONS: Ancef 2 g IV; The antibiotic was administered in an appropriate time frame prior to skin puncture.  ANESTHESIA/SEDATION: Fentanyl 150 mcg IV; Versed 3 mg IV  Total Moderate Sedation Time  56 minutes.  CONTRAST:  A total of 25 mL Isovue 300 was administered into the bilateral collecting systems  FLUOROSCOPY TIME:  4 minutes 0 seconds.  COMPLICATIONS: None immediate  PROCEDURE: The procedure, risks, benefits, and alternatives were explained to the patient. Questions regarding the procedure were encouraged and answered. The patient understands and consents to the procedure. A timeout was performed prior to the initiation of the procedure.  The bilateral flank region was prepped with Betadine in a sterile fashion, and a sterile drape was applied covering the operative field. A sterile gown and sterile gloves were used for the procedure. Local anesthesia was provided with 1% Lidocaine with epinephrine.  Initially, ultrasound was used to localize the left kidney. Under direct ultrasound guidance, a 21 gauge needle was advanced into the  renal collecting system. An ultrasound image documentation was performed. Access within the collecting system was confirmed with the efflux  of urine followed by contrast injection.  Over a Nitrex wire and under intermittent fluoroscopic guidance, the tract was dilated with an Accustick stent. Over a guide wire, a 10-French percutaneous nephrostomy catheter was advanced into the collecting system where the coil was formed and locked. Contrast was injected and several sport radiographs were obtained in various obliquities confirming access. The catheter was secured at the skin with a Prolene retention suture and a gravity bag was placed.  The identical procedure was then performed for the contralateral right kidney ultimately allowing successful placement of a 10 French percutaneous nephrostomy catheter with and coiled and locked within the right renal pelvis.  Dressings were placed. The patient tolerated the above procedures well without immediate postprocedural complication.  FINDINGS: Ultrasound scanning demonstrates a moderate to severely dilated left-sided renal collecting system, as was demonstrated on preprocedural abdominal CT. Under direct ultrasound guidance, a posterior inferior calix was targeted allowing advancement of an 10-French percutaneous nephrostomy catheter under intermittent fluoroscopic guidance. Contrast injection confirmed appropriate positioning.  Ultrasound scanning demonstrated mild dilatation of the right renal collecting system. Under direct ultrasound guidance, a posterior inferior cannulate was targeted ultimately allowing placement of a 10 French percutaneous nephrostomy catheter with end coiled and locked within all right renal pelvis. Contrast injection confirmed appropriate positioning.  IMPRESSION: Successful ultrasound and fluoroscopic guided placement of bilateral 10 French PCNs.  PLAN: The patient could undergo attempted conversion of the bilateral percutaneous nephrostomy  catheters to bilateral double-J ureteral stents in the next 1-2 weeks pending the ultimate plan of care regarding the biopsied pelvic masses.   Electronically Signed   By: Sandi Mariscal M.D.   On: 01/28/2014 13:36   Ir US Guide Bx Asp/drain  01/28/2014   INDICATION: History of pelvic mass concerning for metastatic prostate cancer, now with bilateral obstructive hydronephrosis. Please place bilateral percutaneous nephrostomy catheters for renal function preservation purposes.  EXAM: 1. ULTRASOUND GUIDANCE FOR PUNCTURE OF THE BILATERAL RENAL COLLECTING SYSTEMS 2. BILATERAL PERCUTANEOUS NEPHROSTOMY TUBE PLACEMENT.  COMPARISON:  CT chest, abdomen pelvis - 01/26/2014  MEDICATIONS: Ancef 2 g IV; The antibiotic was administered in an appropriate time frame prior to skin puncture.  ANESTHESIA/SEDATION: Fentanyl 150 mcg IV; Versed 3 mg IV  Total Moderate Sedation Time  56 minutes.  CONTRAST:  A total of 25 mL Isovue 300 was administered into the bilateral collecting systems  FLUOROSCOPY TIME:  4 minutes 0 seconds.  COMPLICATIONS: None immediate  PROCEDURE: The procedure, risks, benefits, and alternatives were explained to the patient. Questions regarding the procedure were encouraged and answered. The patient understands and consents to the procedure. A timeout was performed prior to the initiation of the procedure.  The bilateral flank region was prepped with Betadine in a sterile fashion, and a sterile drape was applied covering the operative field. A sterile gown and sterile gloves were used for the procedure. Local anesthesia was provided with 1% Lidocaine with epinephrine.  Initially, ultrasound was used to localize the left kidney. Under direct ultrasound guidance, a 21 gauge needle was advanced into the renal collecting system. An ultrasound image documentation was performed. Access within the collecting system was confirmed with the efflux of urine followed by contrast injection.  Over a Nitrex wire and under  intermittent fluoroscopic guidance, the tract was dilated with an Accustick stent. Over a guide wire, a 10-French percutaneous nephrostomy catheter was advanced into the collecting system where the coil was formed and locked. Contrast was injected and several sport radiographs were obtained in various obliquities  confirming access. The catheter was secured at the skin with a Prolene retention suture and a gravity bag was placed.  The identical procedure was then performed for the contralateral right kidney ultimately allowing successful placement of a 10 French percutaneous nephrostomy catheter with and coiled and locked within the right renal pelvis.  Dressings were placed. The patient tolerated the above procedures well without immediate postprocedural complication.  FINDINGS: Ultrasound scanning demonstrates a moderate to severely dilated left-sided renal collecting system, as was demonstrated on preprocedural abdominal CT. Under direct ultrasound guidance, a posterior inferior calix was targeted allowing advancement of an 10-French percutaneous nephrostomy catheter under intermittent fluoroscopic guidance. Contrast injection confirmed appropriate positioning.  Ultrasound scanning demonstrated mild dilatation of the right renal collecting system. Under direct ultrasound guidance, a posterior inferior cannulate was targeted ultimately allowing placement of a 10 French percutaneous nephrostomy catheter with end coiled and locked within all right renal pelvis. Contrast injection confirmed appropriate positioning.  IMPRESSION: Successful ultrasound and fluoroscopic guided placement of bilateral 10 French PCNs.  PLAN: The patient could undergo attempted conversion of the bilateral percutaneous nephrostomy catheters to bilateral double-J ureteral stents in the next 1-2 weeks pending the ultimate plan of care regarding the biopsied pelvic masses.   Electronically Signed   By: Sandi Mariscal M.D.   On: 01/28/2014 13:36     Labs:  CBC:  Recent Labs  01/26/14 0300 01/28/14 0402  WBC 6.3 6.5  HGB 10.1* 8.6*  HCT 29.2* 25.3*  PLT 327 285    COAGS:  Recent Labs  01/28/14 0402  INR 1.19  APTT 35    BMP:  Recent Labs  01/28/14 0402 01/29/14 1245 01/30/14 0344 01/31/14 0403  NA 137 134* 135* 135*  K 4.6 4.6 4.7 4.8  CL 103 99 102 102  CO2 '22 23 23 23  ' GLUCOSE 96 117* 85 98  BUN '21 17 17 17  ' CALCIUM 8.8 8.8 8.8 8.8  CREATININE 1.83* 1.60* 1.65* 1.54*  GFRNONAA 37* 43* 41* 45*  GFRAA 42* 50* 48* 52*    LIVER FUNCTION TESTS:  Recent Labs  01/26/14 0300  BILITOT 0.3  AST 32  ALT 7  ALKPHOS 137*  PROT 7.4  ALBUMIN 3.6    Assessment and Plan: S/p bilat PCN's/pelvic mass bx 10/5 secondary to obstructive hydronephrosis/met prostate cancer, renal insuff; creat down to 1.54(1.65); cont PCN's as per urology note; further interventions dependent upon response of prostate ca to treatment      I spent a total of 15 minutes face to face in clinical consultation/evaluation, greater than 50% of which was counseling/coordinating care for bilat perc nephrostomies.  Signed: Autumn Messing 01/31/2014, 3:07 PM

## 2014-01-31 NOTE — Consult Note (Signed)
Attending Note  I personally saw the patient, reviewed the chart and examined the patient. The plan of care was discussed with the patient . I agree with the assessment and plan as documented above.  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   I will inform Dr.Shaddad to follow him as out patient. Thanks for the consult

## 2014-01-31 NOTE — Consult Note (Signed)
[place] CONSULT NOTE  CHIEF COMPLAINTS/PURPOSE OF CONSULTATION:  Metastatic Prostate CA  HISTORY OF PRESENTING ILLNESS:  Jeff [name] is a 68 y.o. male w/ no known past medical history, presented to the [facility] ED on 01/25/14 w/ complaints of back pain. He claims this back pain has been going on for several months, located in the lumbar region, and has progressively gotten worse. He claims this pain sometimes radiates down into his legs and into his abdomen, but mostly just stays in his spine. He also admits to 20-30 lb weight loss over the past 6 months or so, as well as some non-specific symptoms of chills, infrequent night sweats, and mild abdominal pain. He denies any urinary or fecal incontinence, leg weakness, numbness or tingling. The patient also admits to recent worsening difficulty w/ urination. He states he has increased frequency of urination, especially at night, urgency, hesitancy, a weak urine stream, and occasional dysuria.   On admission to the ED, patient had CT chest/abdomen/pelvis, which showed massive retroperitoneal and pelvic lymphadenopathy, diffuse sclerotic bone lesions, and indeterminate small nodular changes in the lungs. Bilateral hydronephrosis caused by extrinsic compression of the ureters was also seen. On 01/28/14, patient underwent bilateral nephrostomy tube placement as well as pelvic mass biopsy which was consistent w/ prostate adenocarcinoma. Aslo of note, PSA was shown to be >5000.  Patient currently lives with his son in [place], recently moved out of a house he was sharing with his girlfriend. States he has a good support system including his son and his boss. He is currently employed as a Architectural technologist.    SUMMARY OF ONCOLOGIC HISTORY:  No history exists.    MEDICAL HISTORY:  No known PMHx; has not seen a physician since the 1990's.   SURGICAL HISTORY: History reviewed. No pertinent past surgical history.  SOCIAL  HISTORY: History   Social History  . Marital Status: Single    Spouse Name: N/A    Number of Children: N/A  . Years of Education: N/A   Occupational History  . Not on file.   Social History Main Topics  . Smoking status: Current Every Day Smoker -- 0.50 packs/day    Types: Cigarettes  . Smokeless tobacco: Not on file  . Alcohol Use: Yes     Comment: occassional 2-3x week  . Drug Use: Yes    Special: Cocaine  . Sexual Activity: Yes   Other Topics Concern  . Not on file   Social History Narrative  . No narrative on file    FAMILY HISTORY: Family History  Problem Relation Age of Onset  . Cancer Brother 74    ALLERGIES:  has No Known Allergies.  MEDICATIONS:  Current Facility-Administered Medications  Medication Dose Route Frequency Provider Last Rate Last Dose  . 0.45 % sodium chloride infusion   Intravenous Continuous Waldemar Dickens, MD 100 mL/hr at 01/31/14 [phone]    . acetaminophen (TYLENOL) tablet 650 mg  650 mg Oral Q6H PRN Waldemar Dickens, MD   650 mg at 01/28/14 1509   Or  . acetaminophen (TYLENOL) suppository 650 mg  650 mg Rectal Q6H PRN Waldemar Dickens, MD      . heparin injection 5,000 Units  5,000 Units Subcutaneous 3 times per day D Rowe Robert, PA-C   5,000 Units at 01/31/14 0520  . morphine 2 MG/ML injection 2 mg  2 mg Intravenous Q4H PRN Waldemar Dickens, MD   2 mg at 01/28/14 1725  . nicotine (NICODERM  CQ - dosed in mg/24 hours) patch 14 mg  14 mg Transdermal Daily Waldemar Dickens, MD   14 mg at 01/30/14 1042  . ondansetron (ZOFRAN) tablet 4 mg  4 mg Oral Q6H PRN Waldemar Dickens, MD       Or  . ondansetron [facility]) injection 4 mg  4 mg Intravenous Q6H PRN Waldemar Dickens, MD      . polyethylene glycol [facility] / GLYCOLAX) packet 17 g  17 g Oral Daily PRN Waldemar Dickens, MD      . [facility]) tablet 8.6 mg  1 tablet Oral BID Waldemar Dickens, MD   8.6 mg at 01/30/14 2110  . sodium chloride 0.9 % injection 10 mL  10 mL Intracatheter Q12H D Kevin  Allred, PA-C   10 mL at 01/30/14 2111  . sodium chloride 0.9 % injection 10 mL  10 mL Intracatheter Q12H D Kevin Allred, PA-C   10 mL at 01/30/14 2111  . zolpidem (AMBIEN) tablet 5 mg  5 mg Oral QHS Robbie Lis, MD   5 mg at 01/30/14 2110    REVIEW OF SYSTEMS:   General: Positive for chills, fatigue and weight loss. Denies fever, diaphoresis, appetite change.  Respiratory: Denies SOB, DOE, cough, chest tightness, and wheezing.   Cardiovascular: Denies chest pain and palpitations.  Gastrointestinal: Positive for abdominal pain. Denies nausea, vomiting, diarrhea, constipation, blood in stool and abdominal distention.  Genitourinary: Positive for dysuria, urgency, frequency, and flank pain. Endocrine: Denies hot or cold intolerance, polyuria, and polydipsia. Musculoskeletal: Positive for back pain. Denies myalgias, joint swelling, arthralgias and gait problem.  Skin: Denies pallor, rash and wounds.  Neurological: Denies dizziness, seizures, syncope, weakness, lightheadedness, numbness and headaches.  Psychiatric/Behavioral: Denies mood changes, confusion, nervousness, sleep disturbance and agitation.   PHYSICAL EXAMINATION: ECOG PERFORMANCE STATUS: 1 - Symptomatic but completely ambulatory  Filed Vitals:   01/31/14 0520  BP: 114/60  Pulse: 81  Temp: 98.7 F (37.1 C)  Resp: 18   Filed Weights   01/25/14 2206 01/26/14 0611  Weight: 135 lb (61.236 kg) 151 lb 0.2 oz (68.5 kg)    GENERAL: Elderly AA male, alert, no distress and comfortable. SKIN: skin color, texture, turgor are normal, no rashes or significant lesions EYES: normal, conjunctiva are pink and non-injected, sclera clear OROPHARYNX: no exudate, no erythema and lips, buccal mucosa, and tongue normal. Poor dentition.  NECK: supple, thyroid normal size, non-tender, without nodularity LYMPH:  no palpable lymphadenopathy in the cervical, axillary or inguinal LUNGS: clear to auscultation and percussion with normal breathing  effort HEART: regular rate & rhythm and no murmurs and no lower extremity edema ABDOMEN: abdomen soft, tender to palpation over the suprapubic region. Mass-like entity palpated in suprapubic region in both left and right lower quadrants, tender to palpation. Normal bowel sounds Musculoskeletal: no cyanosis of digits and no clubbing  PSYCH: alert & oriented x 3 with fluent speech NEURO: no focal motor/sensory deficits  LABORATORY DATA:  I have reviewed the data as listed  Lab Results  Component Value Date   WBC 6.5 01/28/2014   HGB 8.6* 01/28/2014   HCT 25.3* 01/28/2014   MCV 88.5 01/28/2014   PLT 285 01/28/2014   Lab Results  Component Value Date   NA 135* 01/31/2014   K 4.8 01/31/2014   CL 102 01/31/2014   CO2 23 01/31/2014   PSA >5000 Testosterone 496  RADIOGRAPHIC STUDIES:  CT Chest 01/26/14  IMPRESSION:  1. There are a few  scattered subpleural and more central foci of  increased interstitial density which are nonspecific but could  reflect pneumonitis. Subtle subpleural subcentimeter nodules are  nonspecific and most likely inflammatory. There is no  malignant-appearing parenchymal mass. There is no mediastinal nor  hilar lymphadenopathy. There is no pleural nor pericardial effusion.  2. There are widespread sclerotic lesions of the bone consistent  with metastatic disease.  CT Abd/pelvis 01/26/14  IMPRESSION: Massive retroperitoneal and pelvic lymphadenopathy, diffuse sclerotic bone lesions, and indeterminate small nodular changes in the lungs likely representing lymphoma but extensive metastatic disease is also possible. There is bilateral hydronephrosis caused by extrinsic compression of the ureters. Bladder is displaced. Small esophageal hiatal hernia.   ASSESSMENT AND PLAN:  68 y/o M no known PMHx, admitted w/ newly diagnosed extensive metastatic Prostate CA.  Metastatic Prostate CA- CT chest/abdomen/pelvis w/ large pelvic masses, likely bony/spinal involvement,  and possible associated lung nodules as well. PSA >5000 and pelvic mass biopsy suggestive of prostate adenocarcinoma. No obvious sign of hepatic metastases based on imaging. Given extensive metastatic disease, patient is not a surgical candidate at this time. Will likely require androgen deprivation therapy and possibly Taxotere (given extent of disease) vs bilateral orchiectomy. Further details of treatment will be discussed w/ Dr. Lindi Adie as specified below.   Bilateral Hydronephrosis- 2/2 pelvic mass effect and prostate enlargement. Urology following, now s/p bilateral nephrostomy tube placement on 01/28/14. Nephrostomy to be removed pending response to therapy.   All questions were answered. The patient knows to call the clinic with any problems, questions or concerns. I spent 30 minutes counseling the patient face to face. The total time spent in the appointment was 60 minutes and more than 50% was on counseling.    Signed: Luanne Bras, MD 01/31/2014 9:28 AM

## 2014-01-31 NOTE — Care Management Note (Signed)
CARE MANAGEMENT NOTE 01/31/2014  Patient:  Jeff [name], Jeff [name]   Account Number:  [id number]  Date Initiated:  01/31/2014  Documentation initiated by:  [name]  Subjective/Objective Assessment:   68 yo admitted with back pain and trouble urinating.     Action/Plan:   From home with son   Anticipated DC Date:  02/03/2014   Anticipated DC Plan:  [place]  CM consult      Choice offered to / List presented to:             Status of service:  In process, will continue to follow Medicare Important Message given?   (If response is "NO", the following Medicare IM given date fields will be blank) Date Medicare IM given:   Medicare IM given by:   Date Additional Medicare IM given:   Additional Medicare IM given by:    Discharge Disposition:    Per UR Regulation:  Reviewed for med. necessity/level of care/duration of stay  If discussed at [place] of Stay Meetings, dates discussed:   01/31/2014    Comments:  01/31/14 [name] RN,BSN,NCM Chart reviewed for DC needs.  Pt could benefit from PT eval and MD aware.  Will continue to follow for DC needs.

## 2014-01-31 NOTE — Care Management Note (Signed)
CARE MANAGEMENT NOTE 01/31/2014  Patient:  Jeff [name], Jeff [name]   Account Number:  [id number]  Date Initiated:  01/31/2014  Documentation initiated by:  [name]  Subjective/Objective Assessment:   68 yo admitted with back pain and trouble urinating.     Action/Plan:   From home with son   Anticipated DC Date:  02/03/2014   Anticipated DC Plan:  [place]  CM consult      Choice offered to / List presented to:             Status of service:  In process, will continue to follow Medicare Important Message given?  YES (If response is "NO", the following Medicare IM given date fields will be blank) Date Medicare IM given:  01/31/2014 Medicare IM given by:  [name] Date Additional Medicare IM given:   Additional Medicare IM given by:    Discharge Disposition:    Per UR Regulation:  Reviewed for med. necessity/level of care/duration of stay  If discussed at [place] of Stay Meetings, dates discussed:   01/31/2014    Comments:  01/31/14 [name] RN,BSN,NCM Order written for [place] and [facility].  Pt refused both services and states that he can take care of the drain himself.  He also states that his girlfriend is a Therapist, sports and can help him take care of his drain and help him get supplies.  Pt given information about [facility] for his supplies and given address and phone number.  Pt told that he could potentially get the supplies at his local pharmacy as well. RN to have pt flush drain in front of her prior to DC and will send him home with some supplies as well.  Pt states that he is going to live with his girlfriend so not having power in his house is not an issue.  Pt states that he is walking around just fine and does not need any PT either. No other DC needs noted at this time.  01/31/14 [name] RN,BSN,NCM Chart reviewed for DC needs.  Pt could benefit from PT eval and MD aware.  Will continue to follow for DC needs.

## 2014-01-31 NOTE — Progress Notes (Addendum)
  Subjective: [name]. No complaints.  Objective: Vital signs in last 24 hours: Temp:  [98.1 F (36.7 C)-98.7 F (37.1 C)] 98.7 F (37.1 C) (10/08 0520) Pulse Rate:  [72-82] 81 (10/08 0520) Resp:  [17-20] 18 (10/08 0520) BP: (102-114)/(54-60) 114/60 mmHg (10/08 0520) SpO2:  [98 %-99 %] 98 % (10/08 0520)  Intake/Output from previous day: 10/07 0701 - 10/08 0700 In: 2911.3 [P.O.:600; I.V.:2311.3] Out: 2125 [Urine:2125] Intake/Output this shift: Total I/O In: 1511.3 [I.V.:1511.3] Out: 1025 [Urine:1025]  Physical Exam:  Constitutional: Alert and oriented, No acute distress  Cardiovascular: Regular rate and rhythm, No JVD  Respiratory: Normal respiratory effort, Lungs clear bilaterally  GI: Abdomen is soft, nontender, nondistended, no abdominal masses  Genitourinary: B/L neph tubes in place, clear yellow urine. Normal male phallus, testes are descended bilaterally and non-tender and without masses, scrotum is normal in appearance without lesions or masses, perineum is normal on inspection.  Rectal: Normal sphincter tone. Prostate is markedly enlarged, firm, nodular, and asymmetric with right side > Left side  Neurologic: Grossly intact, no focal deficits  Psychiatric: Appropriate mood and affect   Lab Results: CBC Latest Ref Rng 01/28/2014 01/26/2014  WBC 4.0 - 10.5 K/uL 6.5 6.3  Hemoglobin 13.0 - 17.0 g/dL 8.6(L) 10.1(L)  Hematocrit 39.0 - 52.0 % 25.3(L) 29.2(L)  Platelets 150 - 400 K/uL 285 327      Recent Labs  01/30/14 0344 01/31/14 0403  NA 135* 135*  K 4.7 4.8  CL 102 102  CO2 23 23  GLUCOSE 85 98  BUN 17 17  CREATININE 1.65* 1.54*  CALCIUM 8.8 8.8   Cr 10/3: 1.56  Studies/Results: No results found. PSA: >5,000 Retroperitoneal mass biopsy: Adenocarcinoma of the prostate (Gl 4+4=8)  Assessment/Plan: 71M with no prior medical care who presents with metastatic prostate cancer causing symptomatic bilateral retroperitoneal lymphadenopathy (biopsy confirmed as  adenocarcinoma of the prostate), bilateral extrinsic compression of his ureters, bilateral hydronephrosis and renal failure (unknown whether acute or chronic). He also has sclerotic bone lesions and lung lesions. Drastically elevated PSA (>5000) and firm, fixed nodular prostate, and biopsy confirmed bulky mets to retroperitoneum. Suspect urinary symptoms is due to mass effect of compression on bladder.   -- continue bilateral PCNs as indwelling ureteral stents are unlikely to be successful in the long term. Trend Cr. Seems to have reached a plateau around 1.6 -- Med onc consultation -- Discussed forms of androgen deprivation and patient favors bilateral orchiectomy. Discussed risks and benefits of this and he wishes to proceed as soon as possible. Degarelix 240mg  x 1 given prior to discharge. Will schedule for follow up in clinic to discuss bilateral orchiectomy and PCN changes as outpatient.  -- Recommend discharge with Ca & Vit D supplementation.   LOS: 6 days   Wynetta Emery, Wadie C 01/31/2014, 6:36 AM  Patient was seen and examined personally. I discussed the patient and Dr. Wynetta Emery and agree with his assessment and plan. I'll see the patient back in the office and plan continued androgen deprivation or orchiectomy. I'll set up percutaneous nephrostomy tube removal or changes depending on his response to therapy. Appreciate oncology input RE: Chemotherapy.

## 2014-01-31 NOTE — Progress Notes (Signed)
CSW reconsulted regarding concerns surrounding pt not having power in his home for the past 2 months.  Per RN Grayland Ormond who spoke with pt friend/employer, Jeff [name] yesterday and pt friend reports that [facility] has been notified and plans to do a home assessment.   CSW and RNCM met with pt at bedside. CSW and RNCM to discuss home needs. RNCM discussed with pt recommendation for home health for assistance with his drain. Pt declines home health. CSW expressed concern surrounding pt not having power. Pt reported that he plans to discharge to his girlfriends home and that his home currently not having power is not an issue. Pt very eager to return home and states that he can manage his care at home.  CSW later received notification from RN that pt friend/employer, Jeff [name] was requesting to speak with CSW because pt had asked him to provide transportation home, but pt friend/employer declining to do so because he does not feel that pt is returning to a safe environment. Pt has provided permission for medical team to speak with  Pt friend/employer.  CSW contacted Jeff [name] via telephone. CSW introduced self and explained role. Pt  Friend/employer discussed that pt cannot return home and this CSW needs to determine a safer plan. CSW provided supportive listening and discussed with pt  Friend/employer that pt is declining home health at home and states that he is going to his girlfriends home. [name] states that pt girlfriend home is not a good option either. CSW explained to pt  Friend/employer that pt is alert and oriented x 4 and competent to make his own decisions and though there are concerns surrounding pt living environment this CSW has to follow pt wishes. CSW encouraged pt  Friend/employer to speak with pt regarding his concerns as CSW and RNCM had spoken with pt and pt declined assistance and stated that power being out was not an issue at this time.    CSW received return phone call shortly after conversation from pt  Friend/employer, Jeff [name] stating that he spoke with pt and pt now reporting that his girlfriend is unable to have him come live with her and that pt reports that he did not decline home health. Pt  Friend/employer states that pt returning home is not an option as pt does not have power or running water. CSW discussed that CSW will follow up with pt regarding disposition plan.  CSW staffed case with Clinical Social Work Director, [employer] and she recommended ALF or [facility] placement as letter of guarantee as pt does not have skilled need for SNF placement.  CSW met with pt at bedside to discuss. CSW discussed that only option that CSW can offer is to explore ALF and [facility] placement for pt. Pt agreeable to the terms surrounding ALF and [facility] placement.   CSW initiated search and spoke with [facility] and [facility] and both facilities plan to assess pt to determine if they can offer placement.  Pt  Friend/employer, Jeff [name] updated via telephone.  CSW to follow up regarding disposition planning.   Pt is unsafe to discharge home at this time as pt does not have power or water in his home.  Jeff [name], MSW, [place] Work [phone]

## 2014-01-31 NOTE — Discharge Instructions (Signed)
Prostate Cancer The prostate is a male gland that helps produce semen. Prostate cancer is the abnormal growth of cells in this gland. HOME CARE  Only take medicines as told by your doctor.  Eat a healthy diet.  Get plenty of sleep.  Consider joining a support group.  Seek advice to help you manage treatment side effects.  Keep all follow-up visits as told by your doctor.  Tell your cancer specialist if you are admitted to the hospital.  Touch, hold, hug, and caress your partner to continue to show sexual feelings. GET HELP IF:  You have trouble peeing (urinating).  You have blood in your pee.  You have trouble having an erection.  You have pain in your hips, back, or chest. GET HELP RIGHT AWAY IF:  You have weakness or loss of feeling (numbness) in your legs.  You have accidental loss of pee or poop (stool).  Percutaneous Nephrostomy Home Guide A nephrostomy tube allows urine to leave your body when a medical condition prevents it from leaving your kidney normally. Urine is normally carried from the kidneys to the bladder through narrow tubes called ureters. The ureter can become obstructed due to conditions such as kidney stones, tumors, infection, or blood clots. A nephrostomy tube is a hollow, flexible tube placed into the kidney to restore the flow of urine. The tube is placed on the right or left side of your lower back and is connected to an external drainage bag. PERSONAL HYGIENE  You may shower unless otherwise told by your health care provider. Prepare for a shower by placing a plastic covering over the nephrostomy tube dressing.  Change the dressing immediately after showering. Make sure your skin around the nephrostomy tube exit site is dry.  Avoid immersing your nephrostomy tube in water, such as taking a bath or swimming. NEPHROSTOMY TUBE CARE  Your nephrostomy tube is connected to a leg bag or bedside drainage bag. Always keep your tubing, leg bag, or  bedside drainage bag below the level of your kidney so that your urine drains freely.  Your activity is not restricted as long as your activity does not pull or tug on your tube.  During the day, if you are connecting your nephrostomy tube to a leg bag, ensure that your tubing does not have any kinks and that your urine is draining freely. One helpful technique to prevent kinking or inadvertent dislodging of your nephrostomy tubing is to gently wrap an elastic bandage over the tubing. This will help to secure the tubing in place. Make sure there is no tension on the tubing so it does not become dislodged.  At night, you may want to connect your nephrostomy tube to a larger bedside drainage bag. EMPTYING THE LEG BAG OR BEDSIDE DRAINAGE BAG  The leg bag or bedside drainage bag should be emptied when it becomes  full and before going to sleep. Most leg bags and bedside drainage bags have a drain at the bottom that allows urine to be emptied. 1. Hold the leg bag or bedside drainage bag over a toilet or collection container. Use a measuring container if you are directed to measure your urine. 2. Open the drain and allow the urine to drain. 3. Once all the urine is drained from the leg bag or bedside drainage bag, close the drain fully to avoid urine leakage. 4. Flush urine down toilet. If a collection container was used, rinse the container. NEPHROSTOMY TUBE EXIT SITE CARE The exit site for  your nephrostomy tube is covered with a bandage (dressing). Clean your exit site and change your dressing as directed by your health care provider or if your dressing becomes wet. Supplies Needed:  Mild soap and water.  44 inch (10x10 cm) split gauze pads.  44 inch (10x10 cm) gauze pads.  Paper tape. Exit Site Care and Dressing Change:For the first 2 weeks after having a nephrostomy tube inserted, you should change the dressing every day. After 2 weeks, you can change the dressing 2 times per week, whenever  the dressing becomes wet, or as told by your health care provider. Because of the location of your nephrostomy tube, you may need help from another person to complete dressing changes. The steps to changing a dressing are: 1. Wash hands well with soap and water. 2. Gently remove the tapes and dressing from around the nephrostomy tube. Be careful not to pull on the tube while removing the dressing. Avoid using scissors to remove the dressing since this may lead to accidental damage to the tube. 3. Wash the skin around the tube with the mild soap and water, rinse well, and dry with a clean cloth. 4. Inspect the skin around the drain for redness, swelling, and foul-smelling yellow or green discharge. 5. If the drain was sutured to the skin, inspect the suture to verify that it is still anchored in the skin. 6. Place two split gauze pads in and around the tube exit site. Do not apply ointments or alcohol to the site. 7. Place a gauze pad on top of the split gauze pad. 8. Coil the tube on top of the gauze. The tubing should rest on the gauze, not on the skin. 9. Place tape around each edge of the gauze pad. 10. Secure the nephrostomy tubing. Ensure that the nephrostomy tube does not kink or become pinched. The tubing should rest on the gauze pad and not on the skin. 11. Dispose of used supplies properly. FLUSHING YOUR NEPHROSTOMY TUBE  Flush your nephrostomy tube as directed by your health care provider. Flushing of a nephrostomy tube is easier if a three-way stopcock is placed between the tube and the leg bag or bedside drainage bag. One connection of the three-way stopcock connects to your tube, the second connects to the leg bag or bedside drainage bag, and the third connection is usually covered with a cap. The three-way stopcock lever points to the direction on the stopcock that is closed to flow. Normally, the lever points in the direction of the cap to allow urine to drain from the tube to the leg bag  or bedside drainage bag. Supplies Needed:  Rubbing alcohol wipe.  10 mL 0.9% saline syringe. Flushing Your Nephrostomy Tube 1. Gather needed supplies. 2. Move the lever of the three-way stopcock so that it points toward the leg bag or bedside drainage bag. 3. Clean the cap with a rubbing alcohol wipe and then screw the tip of a 10 mL 0.9% saline syringe onto the cap. 4. Using the syringe plunger, slowly push the 10 mL 0.9% saline in the syringe over 5-10 seconds. If resistance is met or pain occurs while pushing, stop pushing the saline immediately. 5. Remove the syringe from the cap. 6. Return the stopcock lever to the usual position which is pointing in the direction of the cap. 7. Dispose of used supplies properly. REPLACING YOUR LEG BAG OR BEDSIDE DRAINAGE BAG  Replace your leg bag or bedside drainage bag, three-way stopcock, and any extension  tubing as directed by your health care provider. Make sure you always have an extra drainage bag and connecting tubing available. 1. Empty urine from your leg bag or bedside drainage bag. 2. Gather new leg bag or bedside drainage bag, three-way stopcock, and any extension tubing. 3. Remove the leg bag or bedside drainage bag, three-way stopcock, and any extension tubing from the nephrostomy tube. 4. Attach the new leg bag or bedside drainage bag, three-way stopcock, and any extension tubing to the nephrostomy tube. 5. Dispose of the used leg bag or bedside drainage bag, three-way stopcock, and any extension tubing from the nephrostomy tube. SEEK IMMEDIATE MEDICAL CARE IF:  You have a fever.  You have abdominal pain during the first week after nephrostomy tube insertion.  You have new appearance of blood in your urine.  You have drainage, redness, swelling or pain at your nephrostomy tube insertion site.  You have difficulty or pain with flushing the tube.  You notice a decrease in your urine output not explained by drinking less  fluids.  Your nephrostomy tube comes out or the suture securing the tube comes free.

## 2014-01-31 NOTE — Progress Notes (Signed)
Progress Note   Jeff [name] ZJI:[id number] DOB: 05-05-1945 DOA: 01/25/2014 PCP: No primary provider on file.   Brief Narrative:   Jeff [name] is an 68 y.o. male with a PMH of polysubstance abuse including cocaine, tobacco abuse who was admitted on 01/25/14 with a chief complaint of back pain and dysuria. Upon initial evaluation in the ED, a CT scan of the abdomen showed massive retroperitoneal and pelvic lymphadenopathy with diffuse sclerotic bone lesions concerning for malignancy as well as bilateral hydronephrosis. PSA was markedly elevated. Patient was seen by urology in consultation and underwent bilateral percutaneous nephrostomy tube placement as well as biopsy of a pelvic mass with pathology positive for prostate adenocarcinoma.  Assessment/Plan:   Principal Problem:   Prostate cancer metastatic to multiple sites  Biopsy confirmed with metastasis to bone and pelvic organs.  Being followed by urologist with recommendations to proceed with firmagon (given 01/30/14) and possible orchiectomy (recommends outpatient followup for this).  Oncology consultation performed 01/31/14 with recommendations for outpatient palliative chemotherapy.  Active Problems:   Homelessness  Patient does not have adequate resources to safely discharged home. His friend tells me that there is no running water or electrical power in his home.  Social worker consulted to assist with placement.    Bone lesion / elevated alkaline phosphatase level  Consistent with bone metastasis. Will need androgen deprivation in palliative chemotherapy to control disease.    Acute renal failure / bilateral hydronephrosis  Secondary to obstructive uropathy, status post percutaneous nephrostomy tube placement on 01/28/14.  Continue to hydrate and monitor for recovery of renal function.    Anemia of chronic disease  Hemoglobin stable with no current indication for transfusion. Initial drop in hemoglobin likely  dilutional.    History of cocaine abuse  Counseled.    Tobacco abuse  Counseled. Nicotine patch ordered.    DVT Prophylaxis  Continue subcutaneous heparin.  Code Status: Full. Family Communication: No family at the bedside, friend/boss updated by telephone. Disposition Plan: Home when stable.   IV Access:    Peripheral IV   Procedures and diagnostic studies:   Ct Renal Stone Study 01/26/2014 Massive retroperitoneal and pelvic lymphadenopathy, diffuse sclerotic bone lesions, and indeterminate small nodular changes in the lungs likely representing lymphoma but extensive metastatic disease is also possible. There is bilateral hydronephrosis caused by extrinsic compression of the ureters. Bladder is displaced. Small esophageal hiatal hernia.   CT chest without the contrast 01/26/2014: There are a few scattered subpleural and more central foci of increased interstitial density which are nonspecific but could reflect pneumonitis. Subtle subpleural subcentimeter nodules are nonspecific and most likely inflammatory. There is no malignant-appearing parenchymal mass. There is no mediastinal nor hilar lymphadenopathy. There is no pleural nor pericardial effusion. 2. There are widespread sclerotic lesions of the bone consistent with metastatic disease  Ir Perc Nephrostomy Left 01/28/2014 IMPRESSION: Successful ultrasound and fluoroscopic guided placement of bilateral 10 French PCNs. PLAN: The patient could undergo attempted conversion of the bilateral percutaneous nephrostomy catheters to bilateral double-J ureteral stents in the next 1-2 weeks pending the ultimate plan of care regarding the biopsied pelvic masses. Electronically Signed By: Sandi Mariscal M.D. On: 01/28/2014 13:36   Ir Perc Nephrostomy Right 01/28/2014 Successful ultrasound and fluoroscopic guided placement of bilateral 10 French PCNs. PLAN: The patient could undergo attempted conversion of the bilateral percutaneous nephrostomy  catheters to bilateral double-J ureteral stents in the next 1-2 weeks pending the ultimate plan of care regarding the biopsied pelvic masses  Ir US Guide Bx Asp/drain 01/28/2014 Technically successful ultrasound guided core needle biopsy of mixed echogenic solid mass within the anterior aspect of the right lower pelvis as was demonstrated on preprocedural abdominal CT.   Ir US Guide Bx Asp/drain 01/28/2014 Successful ultrasound and fluoroscopic guided placement of bilateral 10 French PCNs. PLAN: The patient could undergo attempted conversion of the bilateral percutaneous nephrostomy catheters to bilateral double-J ureteral stents in the next 1-2 weeks pending the ultimate plan of care regarding the biopsied pelvic masses.   Medical Consultants:    Dr. Festus Aloe, Urology.  Dr. Sandi Mariscal, IR  Dr. Nicholas Lose, Oncology.   Other Consultants:    None.   Anti-Infectives:    None.  Subjective:   Jeff [name] is without significant complaint today. He still a little sore at his nephrostomy tube insertion site. No nausea or vomiting. Appetite okay. Bowels are moving.  Objective:    Filed Vitals:   01/30/14 0402 01/30/14 1518 01/30/14 2051 01/31/14 0520  BP: 108/62 102/58 110/54 114/60  Pulse: 75 72 82 81  Temp: 97.6 F (36.4 C) 98.1 F (36.7 C) 98.3 F (36.8 C) 98.7 F (37.1 C)  TempSrc: Oral Oral Oral Oral  Resp: 16 17 20 18   Height:      Weight:      SpO2: 99% 98% 99% 98%    Intake/Output Summary (Last 24 hours) at 01/31/14 5573 Last data filed at 01/31/14 2202  Gross per 24 hour  Intake 2911.3 ml  Output   2125 ml  Net  786.3 ml    Exam: Gen:  NAD Cardiovascular:  RRR, No M/R/G Respiratory:  Lungs CTAB Gastrointestinal:  Abdomen soft, NT/ND, + BS GU: Nephrostomy tubes draining bloody drainage. Extremities:  No C/E/C   Data Reviewed:    Labs: Basic Metabolic Panel:  Recent Labs Lab 01/27/14 1224 01/28/14 0402 01/29/14 1245  01/30/14 0344 01/31/14 0403  NA 136* 137 134* 135* 135*  K 4.5 4.6 4.6 4.7 4.8  CL 101 103 99 102 102  CO2 23 22 23 23 23   GLUCOSE 106* 96 117* 85 98  BUN 21 21 17 17 17   CREATININE 1.76* 1.83* 1.60* 1.65* 1.54*  CALCIUM 8.9 8.8 8.8 8.8 8.8   GFR Estimated Creatinine Clearance: 45.1 ml/min (by C-G formula based on Cr of 1.54). Liver Function Tests:  Recent Labs Lab 01/26/14 0300  AST 32  ALT 7  ALKPHOS 137*  BILITOT 0.3  PROT 7.4  ALBUMIN 3.6   Coagulation profile  Recent Labs Lab 01/28/14 0402  INR 1.19    CBC:  Recent Labs Lab 01/26/14 0300 01/28/14 0402  WBC 6.3 6.5  NEUTROABS 4.4  --   HGB 10.1* 8.6*  HCT 29.2* 25.3*  MCV 86.6 88.5  PLT 327 285   CBG:  Recent Labs Lab 01/27/14 0732 01/28/14 0733 01/29/14 0747 01/30/14 0725  GLUCAP 85 82 162* 90   Sepsis Labs:  Recent Labs Lab 01/26/14 0300 01/28/14 0402  WBC 6.3 6.5   Microbiology No results found for this or any previous visit (from the past 240 hour(s)).   Medications:   . heparin  5,000 Units Subcutaneous 3 times per day  . nicotine  14 mg Transdermal Daily  . senna  1 tablet Oral BID  . sodium chloride  10 mL Intracatheter Q12H  . sodium chloride  10 mL Intracatheter Q12H  . zolpidem  5 mg Oral QHS   Continuous Infusions: . sodium chloride 100 mL/hr at 01/31/14 [phone]  Time spent: 25 minutes.   LOS: 6 days   [facility] Pager [phone]. If unable to reach me by pager, please call my cell phone at [phone].  *Please refer to [email or web address], password [email or web address] to get updated schedule on who will round on this patient, as hospitalists switch teams weekly. If 7PM-7AM, please contact night-coverage at [email or web address], password [email or web address] for any overnight needs.  01/31/2014, 8:11 AM

## 2014-02-01 ENCOUNTER — Telehealth: Payer: Self-pay | Admitting: Oncology

## 2014-02-01 LAB — BASIC METABOLIC PANEL
Anion gap: 12 (ref 5–15)
BUN: 18 mg/dL (ref 6–23)
CO2: 24 meq/L (ref 19–32)
Calcium: 8.9 mg/dL (ref 8.4–10.5)
Chloride: 102 mEq/L (ref 96–112)
Creatinine, Ser: 1.52 mg/dL — ABNORMAL HIGH (ref 0.50–1.35)
GFR calc Af Amer: 53 mL/min — ABNORMAL LOW (ref >90)
GFR calc non Af Amer: 46 mL/min — ABNORMAL LOW (ref >90)
Glucose, Bld: 92 mg/dL (ref 70–99)
Potassium: 5 mEq/L (ref 3.7–5.3)
SODIUM: 138 meq/L (ref 137–147)

## 2014-02-01 LAB — CBC
HCT: 25.3 % — ABNORMAL LOW (ref 39.0–52.0)
Hemoglobin: 8.6 g/dL — ABNORMAL LOW (ref 13.0–17.0)
MCH: 29.5 pg (ref 26.0–34.0)
MCHC: 34 g/dL (ref 30.0–36.0)
MCV: 86.6 fL (ref 78.0–100.0)
Platelets: 283 10*3/uL (ref 150–400)
RBC: 2.92 MIL/uL — ABNORMAL LOW (ref 4.22–5.81)
RDW: 15.2 % (ref 11.5–15.5)
WBC: 7.9 10*3/uL (ref 4.0–10.5)

## 2014-02-01 LAB — GLUCOSE, CAPILLARY: Glucose-Capillary: 122 mg/dL — ABNORMAL HIGH (ref 70–99)

## 2014-02-01 MED ORDER — NICOTINE 14 MG/24HR TD PT24: 14.0000 mg | MEDICATED_PATCH | Freq: Every day | TRANSDERMAL | Status: DC

## 2014-02-01 MED ORDER — CALCIUM CARBONATE-VITAMIN D 500-200 MG-UNIT PO TABS: 1.0000 | ORAL_TABLET | Freq: Two times a day (BID) | ORAL | Status: DC

## 2014-02-01 MED ORDER — TRAMADOL HCL 50 MG PO TABS: 50.0000 mg | ORAL_TABLET | Freq: Four times a day (QID) | ORAL | Status: DC | PRN

## 2014-02-01 MED ORDER — ACETAMINOPHEN 325 MG PO TABS: 650.0000 mg | ORAL_TABLET | Freq: Four times a day (QID) | ORAL | Status: DC | PRN

## 2014-02-01 MED ORDER — TUBERCULIN PPD 5 UNIT/0.1ML ID SOLN
5.0000 [IU] | Freq: Once | INTRADERMAL | Status: DC
  Administered 2014-02-01: 5 [IU] via INTRADERMAL
  Filled 2014-02-01: qty 0.1

## 2014-02-01 NOTE — Care Management Note (Signed)
CARE MANAGEMENT NOTE 02/01/2014  Patient:  Jeff [name], Jeff [name]   Account Number:  [id number]  Date Initiated:  01/31/2014  Documentation initiated by:  [name]  Subjective/Objective Assessment:   68 yo admitted with back pain and trouble urinating.     Action/Plan:   From home with son   Anticipated DC Date:  02/03/2014   Anticipated DC Plan:  HOME/SELF CARE  In-house referral  Clinical Social Worker      DC Forensic scientist  CM consult      [facility] Choice  HOME HEALTH   Choice offered to / List presented to:  C-1 Patient        [place] arranged  HH-1 RN  [place].   Status of service:  In process, will continue to follow Medicare Important Message given?  YES (If response is "NO", the following Medicare IM given date fields will be blank) Date Medicare IM given:  01/31/2014 Medicare IM given by:  [name] Date Additional Medicare IM given:   Additional Medicare IM given by:    Discharge Disposition:    Per UR Regulation:  Reviewed for med. necessity/level of care/duration of stay  If discussed at [place] of Stay Meetings, dates discussed:   01/31/2014    Comments:  02/01/14 [name] RN,BSN,NCM Due to pt pt being unable to DC with girlfriend and his previous home is unsafe, the pt is now going to [facility] assisted living.  The pt chose [facility] for his HHRN and SW. The [facility] will come out for the first few days and reinforce drain teaching that pt has received in the hospital.  Floor RN did teaching with pt so he will be able to do flush this evening prior to [facility] RN coming tomorrow.  [place] referral made and they will also read TB test that was placed today and report it to the [place] care MD.  [place] given to pt by [facility] rep.  No other DC needs noted at this time.  01/31/14 [name] RN,BSN,NCM Order written for [place] and [place].  Pt refused both services and states that he can take care of the drain himself.  He  also states that his girlfriend is a Therapist, sports and can help him take care of his drain and help him get supplies.  Pt given information about [facility] for his supplies and given address and phone number.  Pt told that he could potentially get the supplies at his local pharmacy as well. RN to have pt flush drain in front of her prior to DC and will send him home with some supplies as well.  Pt states that he is going to live with his girlfriend so not having power in his house is not an issue.  Pt states that he is walking around just fine and does not need any PT either. No other DC needs noted at this time.  01/31/14 [name] RN,BSN,NCM Chart reviewed for DC needs.  Pt could benefit from PT eval and MD aware.  Will continue to follow for DC needs.

## 2014-02-01 NOTE — Discharge Summary (Addendum)
Physician Discharge Summary  Jeff [name] NFA:[id number] DOB: January 20, 1946 DOA: 01/25/2014  PCP: No primary provider on file. Oncologist: Dr. Zola Button Urologist: Dr. Festus Aloe  Admit date: 01/25/2014 Discharge date: 02/01/2014   Recommendations for Outpatient Follow-Up:   1. Scheduled for bilateral orchiectomy on 02/05/14 at 10:15 am at [facility] with Dr. Junious Silk. 2. The patient will F/U with Dr. Alen Blew on 02/12/14 at 10:30 AM. 3. The patient will have a home health RN come to the ALF to assist with management of his nephrostomy tubes. 4. Recommend repeat check of creatinine in one week to ensure stability. 5. Recommend obtaining a PCP for routine followup of general medical conditions.   Discharge Diagnosis:   Principal Problem:    Prostate cancer metastatic to multiple sites Active Problems:    Bone lesion    Acute renal failure    Bilateral hydronephrosis    Anemia of chronic disease    History of cocaine abuse    Homelessness   Discharge Condition: Stable.  Diet recommendation: Regular.   History of Present Illness:   Jeff [name] is an 68 y.o. male with a PMH of polysubstance abuse including cocaine, tobacco abuse who was admitted on 01/25/14 with a chief complaint of back pain and dysuria. Upon initial evaluation in the ED, a CT scan of the abdomen showed massive retroperitoneal and pelvic lymphadenopathy with diffuse sclerotic bone lesions concerning for malignancy as well as bilateral hydronephrosis. PSA was markedly elevated. Patient was seen by urology in consultation and underwent bilateral percutaneous nephrostomy tube placement as well as biopsy of a pelvic mass with pathology positive for prostate adenocarcinoma.  Hospital Course by Problem:   Principal Problem:  Prostate cancer metastatic to multiple sites  Biopsy confirmed with metastasis to bone and pelvic organs.  Being followed by urologist with recommendations to proceed with  firmagon (given 01/30/14) and possible orchiectomy (recommends outpatient followup for this).  Oncology consultation performed 01/31/14 with recommendations for outpatient palliative chemotherapy.  Active Problems:  Homelessness  Patient does not have adequate resources to safely discharged home. His friend tells me that there is no running water or electrical power in his home.  Social worker consulted and will now discharge to an assisted living facility.  Bone lesion / elevated alkaline phosphatase level  Consistent with bone metastasis. Will need androgen deprivation and palliative chemotherapy to control disease.  Acute renal failure / bilateral hydronephrosis  Secondary to obstructive uropathy, status post percutaneous nephrostomy tube placement on 01/28/14. [name] Kitchen  Anemia of chronic disease  Hemoglobin stable with no current indication for transfusion. Initial drop in hemoglobin likely dilutional.  History of cocaine abuse  Counseled.  Tobacco abuse  Counseled. Nicotine patch ordered.   Medical Consultants:    None.   Discharge Exam:   Filed Vitals:   02/01/14 0517  BP: 104/63  Pulse: 73  Temp: 98.1 F (36.7 C)  Resp: 16   Filed Vitals:   01/31/14 1509 01/31/14 2105 01/31/14 2245 02/01/14 0517  BP: 102/54 108/69  104/63  Pulse: 78 75 72 73  Temp: 98.2 F (36.8 C) 97.8 F (36.6 C)  98.1 F (36.7 C)  TempSrc: Oral Oral  Oral  Resp: 16 16  16   Height:      Weight:      SpO2: 100% 98%  98%    Gen:  NAD Cardiovascular:  RRR, No M/R/G Respiratory: Lungs CTAB Gastrointestinal: Abdomen soft, NT/ND with normal active bowel sounds. Extremities: No C/E/C   The results of  significant diagnostics from this hospitalization (including imaging, microbiology, ancillary and laboratory) are listed below for reference.     Procedures and Diagnostic Studies:   Ct Renal Stone Study 01/26/2014 Massive retroperitoneal and pelvic lymphadenopathy, diffuse sclerotic bone  lesions, and indeterminate small nodular changes in the lungs likely representing lymphoma but extensive metastatic disease is also possible. There is bilateral hydronephrosis caused by extrinsic compression of the ureters. Bladder is displaced. Small esophageal hiatal hernia.   CT chest without the contrast 01/26/2014: There are a few scattered subpleural and more central foci of increased interstitial density which are nonspecific but could reflect pneumonitis. Subtle subpleural subcentimeter nodules are nonspecific and most likely inflammatory. There is no malignant-appearing parenchymal mass. There is no mediastinal nor hilar lymphadenopathy. There is no pleural nor pericardial effusion. 2. There are widespread sclerotic lesions of the bone consistent with metastatic disease   Ir Perc Nephrostomy Left 01/28/2014 IMPRESSION: Successful ultrasound and fluoroscopic guided placement of bilateral 10 French PCNs. PLAN: The patient could undergo attempted conversion of the bilateral percutaneous nephrostomy catheters to bilateral double-J ureteral stents in the next 1-2 weeks pending the ultimate plan of care regarding the biopsied pelvic masses. Electronically Signed By: Sandi Mariscal M.D. On: 01/28/2014 13:36   Ir Perc Nephrostomy Right 01/28/2014 Successful ultrasound and fluoroscopic guided placement of bilateral 10 French PCNs. PLAN: The patient could undergo attempted conversion of the bilateral percutaneous nephrostomy catheters to bilateral double-J ureteral stents in the next 1-2 weeks pending the ultimate plan of care regarding the biopsied pelvic masses   Ir US Guide Bx Asp/drain 01/28/2014 Technically successful ultrasound guided core needle biopsy of mixed echogenic solid mass within the anterior aspect of the right lower pelvis as was demonstrated on preprocedural abdominal CT.   Ir US Guide Bx Asp/drain 01/28/2014 Successful ultrasound and fluoroscopic guided placement of bilateral 10 French PCNs.  PLAN: The patient could undergo attempted conversion of the bilateral percutaneous nephrostomy catheters to bilateral double-J ureteral stents in the next 1-2 weeks pending the ultimate plan of care regarding the biopsied pelvic masses.    Labs:   Basic Metabolic Panel:  Recent Labs Lab 01/28/14 0402 01/29/14 1245 01/30/14 0344 01/31/14 0403 02/01/14 0420  NA 137 134* 135* 135* 138  K 4.6 4.6 4.7 4.8 5.0  CL 103 99 102 102 102  CO2 22 23 23 23 24   GLUCOSE 96 117* 85 98 92  BUN 21 17 17 17 18   CREATININE 1.83* 1.60* 1.65* 1.54* 1.52*  CALCIUM 8.8 8.8 8.8 8.8 8.9   GFR Estimated Creatinine Clearance: 45.7 ml/min (by C-G formula based on Cr of 1.52). Liver Function Tests:  Recent Labs Lab 01/26/14 0300  AST 32  ALT 7  ALKPHOS 137*  BILITOT 0.3  PROT 7.4  ALBUMIN 3.6   Coagulation profile  Recent Labs Lab 01/28/14 0402  INR 1.19    CBC:  Recent Labs Lab 01/26/14 0300 01/28/14 0402 02/01/14 0420  WBC 6.3 6.5 7.9  NEUTROABS 4.4  --   --   HGB 10.1* 8.6* 8.6*  HCT 29.2* 25.3* 25.3*  MCV 86.6 88.5 86.6  PLT 327 285 283   CBG:  Recent Labs Lab 01/28/14 0733 01/29/14 0747 01/30/14 0725 01/31/14 0810 02/01/14 0759  GLUCAP 82 162* 90 139* 122*   Microbiology Recent Results (from the past 240 hour(s))  URINE CULTURE     Status: None   Collection Time    01/29/14  6:02 PM      Result Value Ref Range Status  Specimen Description KIDNEY LT    Final   Special Requests NONE   Final   Culture  Setup Time     Final   Value: 01/30/2014 10:44     Performed at [facility]     Final   Value: NO GROWTH     Performed at [facility]   Culture     Final   Value: NO GROWTH     Performed at [facility]   Report Status 01/31/2014 FINAL   Final  URINE CULTURE     Status: None   Collection Time    01/30/14  1:52 AM      Result Value Ref Range Status   Specimen Description KIDNEY RT   Final   Special Requests NONE    Final   Culture  Setup Time     Final   Value: 01/30/2014 10:44     Performed at [facility]     Final   Value: NO GROWTH     Performed at [facility]   Culture     Final   Value: NO GROWTH     Performed at [facility]   Report Status 01/31/2014 FINAL   Final     Discharge Instructions:   Discharge Instructions   Activity as tolerated - No restrictions    Complete by:  As directed      Call MD for:  extreme fatigue    Complete by:  As directed      Call MD for:  persistant nausea and vomiting    Complete by:  As directed      Call MD for:  severe uncontrolled pain    Complete by:  As directed      Diet general    Complete by:  As directed      Discharge instructions    Complete by:  As directed   You were cared for by Dr. Jacquelynn Cree  (a hospitalist) during your hospital stay. If you have any questions about your discharge medications or the care you received while you were in the hospital after you are discharged, you can call the unit and ask to speak with the hospitalist on call if the hospitalist that took care of you is not available. Once you are discharged, your primary care physician will handle any further medical issues. Please note that NO REFILLS for any discharge medications will be authorized once you are discharged, as it is imperative that you return to your primary care physician (or establish a relationship with a primary care physician if you do not have one) for your aftercare needs so that they can reassess your need for medications and monitor your lab values.  Any outstanding tests can be reviewed by your PCP at your follow up visit.  It is also important to review any medicine changes with your PCP.  Please bring these d/c instructions with you to your next visit so your physician can review these changes with you.  If you do not have a primary care physician, you can call [phone] for a physician referral.  It is  highly recommended that you obtain a PCP for hospital follow up.     Face-to-face encounter (required for Medicare/Medicaid patients)    Complete by:  As directed   I Ralene Gasparyan certify that this patient is under my care and that I, or a nurse practitioner or physician's  assistant working with me, had a face-to-face encounter that meets the physician face-to-face encounter requirements with this patient on 01/31/2014. The encounter with the patient was in whole, or in part for the following medical condition(s) which is the primary reason for home health care (List medical condition): The patient has newly diagnosed metastatic prostate cancer and poor social support.  He has newly placed nephrostomy tubes and will need home health care to assure he knows how to care for and flush the tubes BID with 10 cc of normal saline.  He has not had electrical power in his home for 2 months.  SW consult for home safety eval.  H/O ETOH abuse.  The encounter with the patient was in whole, or in part, for the following medical condition, which is the primary reason for home health care:  Metastatic prostate cancer, ARF with bilateral hydronephrosis s/p nephrostomy tubes  I certify that, based on my findings, the following services are medically necessary home health services:  Nursing  My clinical findings support the need for the above services:  OTHER SEE COMMENTS  Further, I certify that my clinical findings support that this patient is homebound due to:  Pain interferes with ambulation/mobility  Reason for Medically Necessary Home Health Services:   Skilled Nursing- Skilled Assessment/Observation Skilled Nursing- Teaching of Disease Process/Symptom Management       Home Health    Complete by:  As directed   To provide the following care/treatments:   RN Social work              Medication List    STOP taking these medications       naproxen 500 MG tablet  Commonly known as:  NAPROSYN      sulfamethoxazole-trimethoprim 800-160 MG per tablet  Commonly known as:  SEPTRA DS      TAKE these medications       acetaminophen 325 MG tablet  Commonly known as:  TYLENOL  Take 2 tablets (650 mg total) by mouth every 6 (six) hours as needed for mild pain (or Fever >/= 101).     calcium-vitamin D 500-200 MG-UNIT per tablet  Commonly known as:  OSCAL 500/200 D-3  Take 1 tablet by mouth 2 (two) times daily.     nicotine 14 mg/24hr patch  Commonly known as:  NICODERM CQ - dosed in mg/24 hours  Place 1 patch (14 mg total) onto the skin daily.     traMADol 50 MG tablet  Commonly known as:  ULTRAM  Take 1 tablet (50 mg total) by mouth every 6 (six) hours as needed.           Follow-up Information   Follow up with Festus Aloe, MD. (2-3 weeks)    Specialty:  Urology   Contact information:   [place] [id number] [phone]       Follow up with [facility]. Schedule an appointment as soon as possible for a visit in 1 week. (Call for appt if you don't hear from the office in 1 week.)        Time coordinating discharge: 35 minutes.  Signed:  Waverly Tarquinio  Pager [phone] [facility] 02/01/2014, 11:42 AM

## 2014-02-01 NOTE — Progress Notes (Signed)
Received referral from inpatient LCSW for [facility] services. Noted patient does not have listed Primary Care MD in EPIC. Went to bedside to confirm patient's Primary Care Provider. He reports he does not have one. Provided patient with information for [facility] and Wellness for Primary Care MD. Not eligible for [facility] Care Management services at this time. Made inpatient LCSW aware.  Marthenia Rolling, MSN- [facility] [phone]

## 2014-02-01 NOTE — Telephone Encounter (Signed)
S/W PATIENT AND GAVE NEW D/T FOR NP APPT FOR 10/20 @ 10:30 W/DR. SHADAD.

## 2014-02-01 NOTE — Progress Notes (Addendum)
  Subjective: [name]. Tolerating nephrostomy tubes.   Objective: Vital signs in last 24 hours: Temp:  [97.8 F (36.6 C)-98.2 F (36.8 C)] 98.1 F (36.7 C) (10/09 0517) Pulse Rate:  [72-78] 73 (10/09 0517) Resp:  [16] 16 (10/09 0517) BP: (102-108)/(54-69) 104/63 mmHg (10/09 0517) SpO2:  [98 %-100 %] 98 % (10/09 0517)  Intake/Output from previous day: 10/08 0701 - 10/09 0700 In: 1310 [P.O.:1260; I.V.:10] Out: 1076 [Urine:1075; Stool:1] Intake/Output this shift: Total I/O In: 240 [P.O.:240] Out: -   Physical Exam:  Constitutional: Alert and oriented, No acute distress  Cardiovascular: Regular rate and rhythm, No JVD  Respiratory: Normal respiratory effort, Lungs clear bilaterally  GI: Abdomen is soft, nontender, nondistended, no abdominal masses  Genitourinary: B/L neph tubes in place, clear yellow urine.  Neurologic: Grossly intact, no focal deficits  Psychiatric: Appropriate mood and affect   Lab Results: CBC Latest Ref Rng 02/01/2014 01/28/2014 01/26/2014  WBC 4.0 - 10.5 K/uL 7.9 6.5 6.3  Hemoglobin 13.0 - 17.0 g/dL 8.6(L) 8.6(L) 10.1(L)  Hematocrit 39.0 - 52.0 % 25.3(L) 25.3(L) 29.2(L)  Platelets 150 - 400 K/uL 283 285 327      Recent Labs  01/31/14 0403 02/01/14 0420  NA 135* 138  K 4.8 5.0  CL 102 102  CO2 23 24  GLUCOSE 98 92  BUN 17 18  CREATININE 1.54* 1.52*  CALCIUM 8.8 8.9   Cr 10/3: 1.56  Studies/Results: No results found. PSA: >5,000 Retroperitoneal mass biopsy: Adenocarcinoma of the prostate (Gl 4+4=8)  Assessment/Plan: 68M with no prior medical care who presents with metastatic prostate cancer causing symptomatic bilateral retroperitoneal lymphadenopathy (biopsy confirmed as adenocarcinoma of the prostate), bilateral extrinsic compression of his ureters, bilateral hydronephrosis and renal failure (unknown whether acute or chronic). He also has sclerotic bone lesions and lung lesions. Drastically elevated PSA (>5000) and firm, fixed nodular  prostate, and biopsy confirmed bulky mets to retroperitoneum. Suspect urinary symptoms is due to mass effect of compression on bladder.   -- continue bilateral PCNs as indwelling ureteral stents are unlikely to be successful in the long term. Trend Cr. Seems to have reached a plateau around 1.5-1.6 -- Apprecate med onc consultation to consider chemotherapy -- Degarelix given 10/7. Started on Ca/Vit D supplementation -- Scheduled for bilateral orchiectomy on 10/13 at 10:15 am at [facility] with Dr. Junious Silk. May be done as inpatient if he has not been discharged by that time. -- Disposition per primary team/social work. Appreciate assistance in finding a safe situation for him.    LOS: 7 days   JOHNSON, Ahyan C 02/01/2014, 11:07 AM  I discussed the patient with Dr. Wynetta Emery.  I agree with his assessment and plan.  Also discussed the patient with Dr. Alen Blew.

## 2014-02-01 NOTE — Progress Notes (Signed)
CSW continuing to follow.  Pt was assessed by [facility] this morning and both facilities stated that they could accept pt as a LOG.   CSW met with pt at bedside to discuss. Pt states that he wants to go to [facility] for placement. Pt aware that [facility] is in [place], but really wants to try [facility] for ALF.   CSW spoke with [facility] who confirmed that they could accept pt. Doon request TB test to be placed and HH ordered to read TB test at ALF and [facility] RN for assistance with management of nephrostomy tubes.  CSW notified MD.  CSW notified pt friend, [name] via telephone. Pt friend states that he is unable to provide transportation for pt to [facility]F. Pt friend concerned that pt not starting treatment for prostate cancer in the hospital and CSW explained that pt is scheduled for outpatient follow up. Pt friend states that he plans to come to hospital today and CSW explained that it is likely that pt for discharge today and may likely be transitioning over to [facility] by that time.   CSW faxed pt discharge information to [facility].   CSW to arrange transport for pt to [facility].  Alison Murray, MSW, [place] Work [phone]

## 2014-02-01 NOTE — Progress Notes (Signed)
TB skin test placed in left anterior forearm.  Zandra Abts [facility]  02/01/2014 12:35 PM

## 2014-02-01 NOTE — Progress Notes (Signed)
Agree.  Nephrostomies functioning well.  Exchange tubes in 6-8 weeks.

## 2014-02-01 NOTE — Plan of Care (Signed)
Problem: Phase III Progression Outcomes Goal: Voiding independently Outcome: Not Applicable Date Met:  35/78/97 Has PCN's bilaterally

## 2014-02-01 NOTE — Progress Notes (Signed)
   Referring Physician(s): Urology   Subjective: Patient denies any pain at PCN sites, he states he is eager to go home soon.   Allergies: Review of patient's allergies indicates no known allergies.  Medications: Prior to Admission medications   Medication Sig Start Date End Date Taking? Authorizing Provider  acetaminophen (TYLENOL) 325 MG tablet Take 2 tablets (650 mg total) by mouth every 6 (six) hours as needed for mild pain (or Fever >/= 101). 02/01/14   Venetia Maxon Rama, MD  calcium-vitamin D (OSCAL 500/200 D-3) 500-200 MG-UNIT per tablet Take 1 tablet by mouth 2 (two) times daily. 02/01/14   Christina P Rama, MD  nicotine (NICODERM CQ - DOSED IN MG/24 HOURS) 14 mg/24hr patch Place 1 patch (14 mg total) onto the skin daily. 02/01/14   Venetia Maxon Rama, MD  traMADol (ULTRAM) 50 MG tablet Take 1 tablet (50 mg total) by mouth every 6 (six) hours as needed. 02/01/14   Venetia Maxon Rama, MD    Review of Systems  Vital Signs: BP 104/63  Pulse 73  Temp(Src) 98.1 F (36.7 C) (Oral)  Resp 16  Ht 6' (1.829 m)  Wt 151 lb 0.2 oz (68.5 kg)  BMI 20.48 kg/m2  SpO2 98%  Physical Exam General: A&Ox3, NAD Abd: Soft, NT Bilateral PCN NT, dressing C/D/I, no signs of bleeding/hematoma, good clear yellow/amber output.  Imaging: No results found.  Labs:  CBC:  Recent Labs  01/26/14 0300 01/28/14 0402 02/01/14 0420  WBC 6.3 6.5 7.9  HGB 10.1* 8.6* 8.6*  HCT 29.2* 25.3* 25.3*  PLT 327 285 283    COAGS:  Recent Labs  01/28/14 0402  INR 1.19  APTT 35    BMP:  Recent Labs  01/29/14 1245 01/30/14 0344 01/31/14 0403 02/01/14 0420  NA 134* 135* 135* 138  K 4.6 4.7 4.8 5.0  CL 99 102 102 102  CO2 _0 GLUCOSE 117* 85 98 92  BUN _1 CALCIUM 8.8 8.8 8.8 8.9  CREATININE 1.60* 1.65* 1.54* 1.52*  GFRNONAA 43* 41* 45* 46*  GFRAA 50* 48* 52* 53*    LIVER FUNCTION TESTS:  Recent Labs  01/26/14 0300  BILITOT 0.3  AST 32  ALT 7  ALKPHOS 137*  PROT  7.4  ALBUMIN 3.6    Assessment and Plan: S/p bilateral PCN's/pelvic mass bx 10/5 secondary to obstructive hydronephrosis/met prostate cancer PCN's with good output, Cr 1.5 Plans per Urology, PCN's to stay in, scheduled for bilateral orchiectomy on 10/13 at [facility]      I spent a total of 15 minutes face to face in clinical consultation/evaluation, greater than 50% of which was counseling/coordinating care   Signed: Hedy Jacob 02/01/2014, 2:20 PM

## 2014-02-01 NOTE — Progress Notes (Signed)
CSW continuing to follow.  CSW received phone call from [facility] following pt accepting bed offer stating that they are withdrawing their bed offer stating that they cannot manage pt nephrostomy tubes.  CSW discussed with pt and pt agreeable to [facility].  CSW contacted [facility] is willing to accept pt as Letter of Guarantee and can assist pt with applying for Medicaid.  RNCM updated in order to notify [facility] of change in location. HH plans to follow up with pt at [facility].   CSW facilitated pt discharge needs including faxing pt discharge information to facility pharmacy. Discussing with pt and pt friend, [name] at bedside. Discussing with RN. CSW spoke with [facility] who confirmed ambulance transport could be arranged. CSW arranged ambulance transport via [place] [facility].  No further social work needs identified at this time.  CSW signing off.   Alison Murray, MSW, [place] Work [phone]

## 2014-02-01 NOTE — Care Management Note (Signed)
CARE MANAGEMENT NOTE 02/01/2014  Patient:  Jeff [name], Jeff [name]   Account Number:  [id number]  Date Initiated:  01/31/2014  Documentation initiated by:  [name]  Subjective/Objective Assessment:   68 yo admitted with back pain and trouble urinating.     Action/Plan:   From home with son   Anticipated DC Date:  02/03/2014   Anticipated DC Plan:  HOME/SELF CARE  In-house referral  Clinical Social Worker      DC Forensic scientist  CM consult      [facility] Choice  HOME HEALTH   Choice offered to / List presented to:  C-1 Patient        [place] arranged  HH-1 RN  [place].   Status of service:  In process, will continue to follow Medicare Important Message given?  YES (If response is "NO", the following Medicare IM given date fields will be blank) Date Medicare IM given:  01/31/2014 Medicare IM given by:  [name] Date Additional Medicare IM given:   Additional Medicare IM given by:    Discharge Disposition:    Per UR Regulation:  Reviewed for med. necessity/level of care/duration of stay  If discussed at [place] of Stay Meetings, dates discussed:   01/31/2014    Comments:  02/01/14 [name] RN,BSN,NCM Due to pt now going to [place] RN will call TB test results to [facility].  [name] from [facility] will call [place] home on Monday 10/12 to get pt f/u appointment on Wednesday 10/14.  [facility] rep aware of new plan.  02/01/14 [name] RN,BSN,NCM Due to pt pt being unable to DC with girlfriend and his previous home is unsafe, the pt is now going to [facility] assisted living.  The pt chose [facility] for his HHRN and SW. The [facility] will come out for the first few days and reinforce drain teaching that pt has received in the hospital.  Floor RN did teaching with pt so he will be able to do flush this evening prior to [facility] RN coming tomorrow.  [place] referral made and they will also read TB test that was placed today and report  it to the [place] care MD.  [place] given to pt by [name].  No other DC needs noted at this time.  01/31/14 [name] RN,BSN,NCM Order written for [place] [facility] [place].  Pt refused both services and states that he can take care of the drain himself.  He also states that his girlfriend is a Therapist, sports and can help him take care of his drain and help him get supplies.  Pt given information about [facility] for his supplies and given address and phone number.  Pt told that he could potentially get the supplies at his local pharmacy as well. RN to have pt flush drain in front of her prior to DC and will send him home with some supplies as well.  Pt states that he is going to live with his girlfriend so not having power in his house is not an issue.  Pt states that he is walking around just fine and does not need any PT either. No other DC needs noted at this time.  01/31/14 [name] RN,BSN,NCM Chart reviewed for DC needs.  Pt could benefit from PT eval and MD aware.  Will continue to follow for DC needs.

## 2014-02-01 NOTE — Telephone Encounter (Signed)
S/W PATIENT AND GAVE NP APPT FOR 10/13 @ 1:30 W/DR. SHADAD.

## 2014-02-04 ENCOUNTER — Encounter (HOSPITAL_COMMUNITY): Payer: Self-pay | Admitting: Pharmacy Technician

## 2014-02-04 ENCOUNTER — Encounter (HOSPITAL_COMMUNITY): Payer: Self-pay | Admitting: *Deleted

## 2014-02-04 ENCOUNTER — Other Ambulatory Visit: Payer: Self-pay | Admitting: Urology

## 2014-02-04 NOTE — Progress Notes (Signed)
                 Your procedure is scheduled on: 02-05-14  Report to [facility] [facility] at AM. [phone]  Call this number if you have problems the morning of surgery: [phone]  Spoke with Shirlean Mylar the medication tech [phone] for the [facility] and reviewed the pre op instructions by telephone told that the pre op instructions would be faxed to the facility at [phone].  Mentioned that I would call her back to make sure the instructions were received and to ask if there were any questions.   Remember: Bring insurance card and picture ID, PLEASE SEND CURRENT  MEDICATION LIST AND TIME LAST MEDICATIONS TAKEN  WITH  PATIENT MORNING OF SURGERY.   Do not drink liquids or  eat food:After Midnight. Monday, February 04, 2014    Take these medicines the morning of surgery with A SIP OF WATER:  Ultram if needed for paIn                                 Do not wear jewelry, make-up or nail polish.  Do not wear lotions, powders, or perfumes. You may wear deodorant.             Men may shave face and neck.  Do not bring valuables to the hospital.  Contacts, dentures or bridgework may not be worn into surgery.  Leave suitcase in the car. After surgery it may be brought to your room.  For patients admitted to the hospital, checkout time is 11:00 AM the day of discharge.   Patients discharged the day of surgery will not be allowed to drive home.   Name and phone number of your driver:  Staff from [facility] [phone]                          Please review the attached pre op instructions [place]                                                          Call Guadelupe Sabin, RN pre op nurse if needed [phone]               [place].    PATIENT    SIGNATURE___________________________________________________

## 2014-02-05 ENCOUNTER — Encounter (HOSPITAL_COMMUNITY)
Admission: RE | Disposition: A | Discharge status: Other Acute Inpatient Hospital | Payer: Self-pay | Source: Ambulatory Visit | Attending: Urology

## 2014-02-05 ENCOUNTER — Other Ambulatory Visit: Payer: Medicare Other

## 2014-02-05 ENCOUNTER — Inpatient Hospital Stay (HOSPITAL_COMMUNITY): Payer: Medicare Other | Admitting: Anesthesiology

## 2014-02-05 ENCOUNTER — Ambulatory Visit: Payer: Medicare Other

## 2014-02-05 ENCOUNTER — Encounter (HOSPITAL_COMMUNITY): Payer: Medicare Other | Admitting: Anesthesiology

## 2014-02-05 ENCOUNTER — Encounter (HOSPITAL_COMMUNITY): Payer: Self-pay | Admitting: *Deleted

## 2014-02-05 ENCOUNTER — Ambulatory Visit: Payer: Medicare Other | Admitting: Oncology

## 2014-02-05 ENCOUNTER — Ambulatory Visit (HOSPITAL_COMMUNITY)
Admission: RE | Admit: 2014-02-05 | Discharge: 2014-02-05 | Payer: Medicare Other | Source: Ambulatory Visit | Attending: Urology | Admitting: Urology

## 2014-02-05 DIAGNOSIS — D638 Anemia in other chronic diseases classified elsewhere: Secondary | ICD-10-CM | POA: Diagnosis not present

## 2014-02-05 DIAGNOSIS — F1010 Alcohol abuse, uncomplicated: Secondary | ICD-10-CM | POA: Diagnosis not present

## 2014-02-05 DIAGNOSIS — Z590 Homelessness: Secondary | ICD-10-CM | POA: Insufficient documentation

## 2014-02-05 DIAGNOSIS — Z720 Tobacco use: Secondary | ICD-10-CM | POA: Insufficient documentation

## 2014-02-05 DIAGNOSIS — F1410 Cocaine abuse, uncomplicated: Secondary | ICD-10-CM | POA: Insufficient documentation

## 2014-02-05 DIAGNOSIS — C61 Malignant neoplasm of prostate: Secondary | ICD-10-CM | POA: Diagnosis not present

## 2014-02-05 DIAGNOSIS — C7982 Secondary malignant neoplasm of genital organs: Secondary | ICD-10-CM | POA: Diagnosis present

## 2014-02-05 HISTORY — PX: ORCHIECTOMY: SHX2116

## 2014-02-05 LAB — RAPID URINE DRUG SCREEN, HOSP PERFORMED
Amphetamines: NOT DETECTED
BARBITURATES: NOT DETECTED
Benzodiazepines: NOT DETECTED
COCAINE: NOT DETECTED
OPIATES: NOT DETECTED
Tetrahydrocannabinol: NOT DETECTED

## 2014-02-05 LAB — CBC
HEMATOCRIT: 28.9 % — AB (ref 39.0–52.0)
HEMOGLOBIN: 9.6 g/dL — AB (ref 13.0–17.0)
MCH: 29.5 pg (ref 26.0–34.0)
MCHC: 33.2 g/dL (ref 30.0–36.0)
MCV: 88.9 fL (ref 78.0–100.0)
Platelets: 315 10*3/uL (ref 150–400)
RBC: 3.25 MIL/uL — AB (ref 4.22–5.81)
RDW: 15 % (ref 11.5–15.5)
WBC: 6.9 10*3/uL (ref 4.0–10.5)

## 2014-02-05 SURGERY — ORCHIECTOMY
Anesthesia: General | Site: Scrotum | Laterality: Bilateral

## 2014-02-05 MED ORDER — PHENYLEPHRINE HCL 10 MG/ML IJ SOLN
INTRAMUSCULAR | Status: DC | PRN
  Administered 2014-02-05: 80 ug via INTRAVENOUS
  Administered 2014-02-05: 120 ug via INTRAVENOUS
  Administered 2014-02-05: 80 ug via INTRAVENOUS

## 2014-02-05 MED ORDER — PHENYLEPHRINE 40 MCG/ML (10ML) SYRINGE FOR IV PUSH (FOR BLOOD PRESSURE SUPPORT)
PREFILLED_SYRINGE | INTRAVENOUS | Status: AC
  Filled 2014-02-05: qty 10

## 2014-02-05 MED ORDER — EPHEDRINE SULFATE 50 MG/ML IJ SOLN
INTRAMUSCULAR | Status: AC
  Filled 2014-02-05: qty 1

## 2014-02-05 MED ORDER — PROPOFOL 10 MG/ML IV BOLUS
INTRAVENOUS | Status: DC | PRN
  Administered 2014-02-05: 100 mg via INTRAVENOUS

## 2014-02-05 MED ORDER — ONDANSETRON HCL 4 MG/2ML IJ SOLN
INTRAMUSCULAR | Status: AC
  Filled 2014-02-05: qty 2

## 2014-02-05 MED ORDER — SODIUM CHLORIDE 0.9 % IV SOLN
INTRAVENOUS | Status: DC | PRN
Start: 2014-02-05 — End: 2014-02-05
  Administered 2014-02-05 (×2): via INTRAVENOUS

## 2014-02-05 MED ORDER — HYDROCODONE-ACETAMINOPHEN 5-325 MG PO TABS
1.0000 | ORAL_TABLET | Freq: Four times a day (QID) | ORAL | Status: DC | PRN
Start: 2014-02-05 — End: 2014-03-20

## 2014-02-05 MED ORDER — FENTANYL CITRATE 0.05 MG/ML IJ SOLN
INTRAMUSCULAR | Status: AC
  Filled 2014-02-05: qty 5

## 2014-02-05 MED ORDER — SUCCINYLCHOLINE CHLORIDE 20 MG/ML IJ SOLN
INTRAMUSCULAR | Status: DC | PRN
  Administered 2014-02-05: 100 mg via INTRAVENOUS

## 2014-02-05 MED ORDER — ONDANSETRON HCL 4 MG/2ML IJ SOLN
INTRAMUSCULAR | Status: DC | PRN
  Administered 2014-02-05: 4 mg via INTRAVENOUS

## 2014-02-05 MED ORDER — EPHEDRINE SULFATE 50 MG/ML IJ SOLN
INTRAMUSCULAR | Status: DC | PRN
  Administered 2014-02-05: 15 mg via INTRAVENOUS

## 2014-02-05 MED ORDER — BUPIVACAINE HCL (PF) 0.25 % IJ SOLN
INTRAMUSCULAR | Status: DC | PRN
  Administered 2014-02-05: 10 mL

## 2014-02-05 MED ORDER — FENTANYL CITRATE 0.05 MG/ML IJ SOLN
INTRAMUSCULAR | Status: DC | PRN
  Administered 2014-02-05: 100 ug via INTRAVENOUS
  Administered 2014-02-05: 50 ug via INTRAVENOUS
  Administered 2014-02-05: 100 ug via INTRAVENOUS

## 2014-02-05 MED ORDER — BUPIVACAINE HCL (PF) 0.25 % IJ SOLN
INTRAMUSCULAR | Status: AC
  Filled 2014-02-05: qty 30

## 2014-02-05 MED ORDER — CEFAZOLIN SODIUM-DEXTROSE 2-3 GM-% IV SOLR
2.0000 g | INTRAVENOUS | Status: AC
  Administered 2014-02-05: 2 g via INTRAVENOUS

## 2014-02-05 MED ORDER — SODIUM CHLORIDE 0.9 % IJ SOLN
INTRAMUSCULAR | Status: AC
  Filled 2014-02-05: qty 10

## 2014-02-05 MED ORDER — KETAMINE HCL 50 MG/ML IJ SOLN
INTRAMUSCULAR | Status: DC | PRN
  Administered 2014-02-05: 50 mg via INTRAMUSCULAR

## 2014-02-05 MED ORDER — KETAMINE HCL 10 MG/ML IJ SOLN
INTRAMUSCULAR | Status: AC
  Filled 2014-02-05: qty 1

## 2014-02-05 MED ORDER — CEFAZOLIN SODIUM-DEXTROSE 2-3 GM-% IV SOLR
INTRAVENOUS | Status: AC
  Filled 2014-02-05: qty 50

## 2014-02-05 MED ORDER — MIDAZOLAM HCL 2 MG/2ML IJ SOLN
INTRAMUSCULAR | Status: AC
  Filled 2014-02-05: qty 2

## 2014-02-05 MED ORDER — SODIUM CHLORIDE 0.9 % IV SOLN
INTRAVENOUS | Status: DC
  Administered 2014-02-05: 1000 mL via INTRAVENOUS

## 2014-02-05 MED ORDER — DEXAMETHASONE SODIUM PHOSPHATE 10 MG/ML IJ SOLN
INTRAMUSCULAR | Status: DC | PRN
  Administered 2014-02-05: 10 mg via INTRAVENOUS

## 2014-02-05 MED ORDER — FENTANYL CITRATE 0.05 MG/ML IJ SOLN: 25.0000 ug | INTRAMUSCULAR | Status: DC | PRN

## 2014-02-05 MED ORDER — MIDAZOLAM HCL 5 MG/5ML IJ SOLN
INTRAMUSCULAR | Status: DC | PRN
  Administered 2014-02-05: 2 mg via INTRAVENOUS

## 2014-02-05 MED ORDER — 0.9 % SODIUM CHLORIDE (POUR BTL) OPTIME
TOPICAL | Status: DC | PRN
  Administered 2014-02-05: 1000 mL

## 2014-02-05 MED ORDER — SODIUM CHLORIDE 0.9 % IV SOLN: INTRAVENOUS | Status: DC

## 2014-02-05 MED ORDER — PROMETHAZINE HCL 25 MG/ML IJ SOLN: 6.2500 mg | INTRAMUSCULAR | Status: DC | PRN

## 2014-02-05 MED ORDER — PROPOFOL 10 MG/ML IV BOLUS
INTRAVENOUS | Status: AC
  Filled 2014-02-05: qty 20

## 2014-02-05 SURGICAL SUPPLY — 30 items
BLADE HEX COATED 2.75 (ELECTRODE) ×3 IMPLANT
BNDG GAUZE ELAST 4 BULKY (GAUZE/BANDAGES/DRESSINGS) ×3 IMPLANT
COVER SURGICAL LIGHT HANDLE (MISCELLANEOUS) ×3 IMPLANT
DRSG TELFA 3X8 NADH (GAUZE/BANDAGES/DRESSINGS) ×3 IMPLANT
ELECT REM PT RETURN 9FT ADLT (ELECTROSURGICAL) ×3
ELECTRODE REM PT RTRN 9FT ADLT (ELECTROSURGICAL) ×1 IMPLANT
GLOVE BIOGEL M STRL SZ7.5 (GLOVE) ×3 IMPLANT
GLOVE BIOGEL PI IND STRL 6.5 (GLOVE) IMPLANT
GLOVE BIOGEL PI IND STRL 7.5 (GLOVE) IMPLANT
GLOVE BIOGEL PI IND STRL 8 (GLOVE) IMPLANT
GLOVE BIOGEL PI INDICATOR 6.5 (GLOVE) ×2
GLOVE BIOGEL PI INDICATOR 7.5 (GLOVE) ×2
GLOVE BIOGEL PI INDICATOR 8 (GLOVE) ×2
GOWN STRL REUS W/TWL LRG LVL3 (GOWN DISPOSABLE) ×4 IMPLANT
GOWN STRL REUS W/TWL XL LVL3 (GOWN DISPOSABLE) ×3 IMPLANT
KIT BASIN OR (CUSTOM PROCEDURE TRAY) ×3 IMPLANT
NEEDLE HYPO 22GX1.5 SAFETY (NEEDLE) ×2 IMPLANT
NS IRRIG 1000ML POUR BTL (IV SOLUTION) ×3 IMPLANT
PACK GENERAL/GYN (CUSTOM PROCEDURE TRAY) ×3 IMPLANT
PAD DRESSING TELFA 3X8 NADH (GAUZE/BANDAGES/DRESSINGS) IMPLANT
SUPPORT SCROTAL MED ADLT STRP (MISCELLANEOUS) ×1 IMPLANT
SUPPORTER ATHLETIC MED (MISCELLANEOUS) ×1
SUT CHROMIC 3 0 SH 27 (SUTURE) ×6 IMPLANT
SUT MNCRL AB 4-0 PS2 18 (SUTURE) ×2 IMPLANT
SUT VIC AB 2-0 CT1 18 (SUTURE) ×2 IMPLANT
SUT VIC AB 2-0 UR5 27 (SUTURE) ×2 IMPLANT
SUT VICRYL 0 TIES 12 18 (SUTURE) IMPLANT
SYR CONTROL 10ML LL (SYRINGE) ×2 IMPLANT
TOWEL OR 17X26 10 PK STRL BLUE (TOWEL DISPOSABLE) ×6 IMPLANT
WATER STERILE IRR 1500ML POUR (IV SOLUTION) ×3 IMPLANT

## 2014-02-05 NOTE — Transfer of Care (Signed)
Immediate Anesthesia Transfer of Care Note  Patient: Jeff [name]  Procedure(s) Performed: Procedure(s): BILATERAL ORCHIECTOMY (Bilateral)  Patient Location: PACU  Anesthesia Type:General  Level of Consciousness: awake, sedated and patient cooperative  Airway & Oxygen Therapy: Patient Spontanous Breathing and Patient connected to face mask oxygen  Post-op Assessment: Report given to PACU RN and Post -op Vital signs reviewed and stable  Post vital signs: Reviewed and stable  Complications: No apparent anesthesia complications

## 2014-02-05 NOTE — Discharge Instructions (Signed)
Discharge instructions following scrotal surgery  Call your doctor for:  Fever is greater than 100.5  Severe nausea or vomiting  Increasing pain not controlled by pain medication  Increasing redness or drainage from incisions  The number for questions or concerns is [phone]  Activity level: No lifting greater than 10 pounds (about equal to milk) for the next 2 weeks or until cleared to do so at follow-up appointment.  Otherwise activity as tolerated by comfort level.  Diet: May resume your regular diet as tolerated  Driving: No driving while still taking opiate pain medications (weight at least 6-8 hours after last dose).  No driving if you still sore from surgery as it may limit her ability to react quickly if necessary.   Shower/bath: May shower and get incision wet pad dry immediately following.  Do not scrub vigorously for the next 2-3 weeks.  Do not soak incision (ID soaking in bath or swimming) until told he may do so by Dr., as this may promote a wound infection.  Wound care: He may cover wounds with sterile gauze as needed to prevent incisions rubbing on close follow-up in any seepage.  Where tight fitting underpants for at least 2 weeks.  He should apply cold compresses (ice or sac of frozen peas/corn) to your scrotum for at least 48 hours to reduce the swelling.  You should expect that his scrotum will swell up initially and then get smaller over the next 2-4 weeks.  Follow-up appointments: Call to schedule follow-up appointment with Dr. Junious Silk in a couple weeks for a wound check.

## 2014-02-05 NOTE — Op Note (Signed)
Preoperative diagnosis:  1. Metastatic prostate cancer  Postoperative diagnosis: 1. Same  Procedure(s): 1. Bilateral simple orchiectomy (scrotal approach)  Surgeon: Dr.Matthew Eskridge  Resident: Dr. Amaryllis Dyke  Anesthesia: General  Complications: None  EBL: Minimal  Specimens: Bilateral testicles  Disposition of specimens: Pathology  Intraoperative findings: Normal appearing testicles. Spermatic cord pedicles hemostatic.  Indication: 23M with bulky retroperitoneal lymphadenopathy secondary to metastatic prostate cancer. Elected for bilateral orchiectomy for androgen deprivation. Consent signed after risks, benefits, and alternatives discussed.  Description of procedure:  The patient was verified in pre-op and brought to the OR. He was placed supine and general anesthesia was induced. IV ancef was administered and SCDs were placed. He was clipped, prepped, and draped in the usual sterile fashion. A surgical timeout was called and the case was begun. A 3cm median raphe incision was made and the dartos was dissected using the Bovie. The right testicle was brought though the incision and the layers were dissected off using the Bovie until the tunica vaginalis was entered. There was a small hydrocele on the right. The cremasteric fibers were incised to release the cord. The cremasteric pedicle on the right was tied with 3-0 Vicryl. The spermatic cord was divided into 3 pedicles, 1 of which was the vas deferens. The testicle was removed using heavy scissors. These 3 pedicles were doubly ligated and the ends were cauterized with Bovie. Hemostasis was ensured. This procedure was repeated on the left side in an identical fashion. The dartos was closed with a running 3-0 Vicryl suture and the skin with 4-0 monacryl in a horizontal mattress fashion. Telfa and fluffs were applied for dressing. He was awoken from general anesthesia having tolerated the procedure well.

## 2014-02-05 NOTE — Anesthesia Preprocedure Evaluation (Signed)
Anesthesia Evaluation  Patient identified by MRN, date of birth, ID band Patient awake  General Assessment Comment: Acute renal failure 01/26/2014   Anemia of chronic disease 01/26/2014      Bilateral hydronephrosis 01/26/2014   History of cocaine abuse 01/26/2014      Homelessness 01/31/2014   Prostate cancer metastatic to multiple sites 01/30/2014    Reviewed: Allergy & Precautions, H&P , NPO status , Patient's Chart, lab work & pertinent test results  Airway Mallampati: II TM Distance: >3 FB Neck ROM: Full    Dental no notable dental hx.    Pulmonary neg pulmonary ROS, Current Smoker,  breath sounds clear to auscultation  Pulmonary exam normal       Cardiovascular negative cardio ROS  Rhythm:Regular Rate:Normal     Neuro/Psych negative neurological ROS  negative psych ROS   GI/Hepatic negative GI ROS, (+)     substance abuse  alcohol use and cocaine use, Drug abuse: endorses smoking crack cocaine   Endo/Other  negative endocrine ROS  Renal/GU Renal InsufficiencyRenal disease  negative genitourinary   Musculoskeletal negative musculoskeletal ROS (+)   Abdominal   Peds negative pediatric ROS (+)  Hematology  (+) anemia ,   Anesthesia Other Findings   Reproductive/Obstetrics negative OB ROS                           Anesthesia Physical Anesthesia Plan  ASA: IV  Anesthesia Plan: General   Post-op Pain Management:    Induction: Intravenous  Airway Management Planned: Oral ETT  Additional Equipment:   Intra-op Plan:   Post-operative Plan: Extubation in OR  Informed Consent: I have reviewed the patients History and Physical, chart, labs and discussed the procedure including the risks, benefits and alternatives for the proposed anesthesia with the patient or authorized representative who has indicated his/her understanding and acceptance.   Dental advisory given  Plan Discussed  with: CRNA and Surgeon  Anesthesia Plan Comments:         Anesthesia Quick Evaluation

## 2014-02-05 NOTE — H&P (Signed)
Urology H&P  Chief Complaint: Metastatic prostate cancer  History of Present Illness: 78M with newly diagnosed metastatic prostate cancer with bulky retroperitoneal lymphadenopathy causing ureteral obstruction. PSA >5000 and biopsy of RP nodes was positive for Gleason 4+4 PCa. Was given degarelix prior to discharge and options for androgen ablation were discussed. He elected bilateral orchiectomy.  He is also scheduled for Medical Oncology appointment for discussion of possible chemotherapy.  Past Medical History  Diagnosis Date  . Acute renal failure 01/26/2014  . Anemia of chronic disease 01/26/2014  . Bilateral hydronephrosis 01/26/2014  . History of cocaine abuse 01/26/2014  . Homelessness 01/31/2014  . 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 Alcohol abuse    History reviewed. No pertinent past surgical history.  Home Medications:  Prescriptions prior to admission  Medication Sig Dispense Refill  . acetaminophen (TYLENOL) 325 MG tablet Take 2 tablets (650 mg total) by mouth every 6 (six) hours as needed for mild pain (or Fever >/= 101).      . calcium-vitamin D (OSCAL 500/200 D-3) 500-200 MG-UNIT per tablet Take 1 tablet by mouth 2 (two) times daily.  60 tablet  2  . nicotine (NICODERM CQ - DOSED IN MG/24 HOURS) 14 mg/24hr patch Place 1 patch (14 mg total) onto the skin daily.  28 patch  1  . traMADol (ULTRAM) 50 MG tablet Take 1 tablet (50 mg total) by mouth every 6 (six) hours as needed.  30 tablet  0   Allergies: No Known Allergies  Family History  Problem Relation Age of Onset  . Cancer Brother 82   Social History:  reports that he has been smoking Cigarettes.  He has been smoking about 0.50 packs per day. He does not have any smokeless tobacco history on file. He reports that he drinks alcohol. He reports that he uses illicit drugs (Cocaine).  Review of Systems  All other systems reviewed and are negative.   Physical Exam:  Vital signs in last 24  hours: Temp:  [97.7 F (36.5 C)] 97.7 F (36.5 C) (10/13 0739) Pulse Rate:  [66] 66 (10/13 0739) Resp:  [18] 18 (10/13 0739) BP: (111)/(69) 111/69 mmHg (10/13 0739) SpO2:  [100 %] 100 % (10/13 0739) Weight:  [72.689 kg (160 lb 4 oz)] 72.689 kg (160 lb 4 oz) (10/13 0739) Physical Exam  Constitutional: He is oriented to person, place, and time. He appears well-developed and well-nourished.  HENT:  Head: Normocephalic and atraumatic.  Eyes: EOM are normal. Pupils are equal, round, and reactive to light.  Neck: Normal range of motion.  Cardiovascular: Normal rate and regular rhythm.   Respiratory: Effort normal.  GI: Soft.  Musculoskeletal: Normal range of motion.  Neurological: He is alert and oriented to person, place, and time.  Skin: Skin is warm and dry.  Psychiatric: He has a normal mood and affect. His behavior is normal.  GU: bilateral neph tubes in place with clear yellow urine  Laboratory Data:  Results for orders placed during the hospital encounter of 02/05/14 (from the past 24 hour(s))  CBC     Status: Abnormal   Collection Time    02/05/14  8:05 AM      Result Value Ref Range   WBC 6.9  4.0 - 10.5 K/uL   RBC 3.25 (*) 4.22 - 5.81 MIL/uL   Hemoglobin 9.6 (*) 13.0 - 17.0 g/dL   HCT 28.9 (*) 39.0 - 52.0 %   MCV 88.9  78.0 - 100.0 fL  MCH 29.5  26.0 - 34.0 pg   MCHC 33.2  30.0 - 36.0 g/dL   RDW 15.0  11.5 - 15.5 %   Platelets 315  150 - 400 K/uL   Recent Results (from the past 240 hour(s))  URINE CULTURE     Status: None   Collection Time    01/29/14  6:02 PM      Result Value Ref Range Status   Specimen Description KIDNEY LT    Final   Special Requests NONE   Final   Culture  Setup Time     Final   Value: 01/30/2014 10:44     Performed at [facility]     Final   Value: NO GROWTH     Performed at [facility]   Culture     Final   Value: NO GROWTH     Performed at [facility]   Report Status 01/31/2014 FINAL    Final  URINE CULTURE     Status: None   Collection Time    01/30/14  1:52 AM      Result Value Ref Range Status   Specimen Description KIDNEY RT   Final   Special Requests NONE   Final   Culture  Setup Time     Final   Value: 01/30/2014 10:44     Performed at [place]     Final   Value: NO GROWTH     Performed at [facility]   Culture     Final   Value: NO GROWTH     Performed at [facility]   Report Status 01/31/2014 FINAL   Final   Creatinine:  Recent Labs  01/29/14 1245 01/30/14 0344 01/31/14 0403 02/01/14 0420  CREATININE 1.60* 1.65* 1.54* 1.52*    Impression/Assessment:  11M presenting for bilateral orchiectomy for permanent androgen ablation as treatment for metastatic prostate cancer after full discussion of the risks, including but not limited to infection, bleeding/hematoma, damage to the ilioinguinal nerve causing numbness, damage to other nearby structure, unsatisfactory cosmetic result, and the risks of permanent androgen deprivation. He was already started on Vit D and calcium.   Plan:  Bilateral orchiectomy  Areil Ottey, Jmichael C 02/05/2014, 8:27 AM

## 2014-02-05 NOTE — H&P (Signed)
Pt personally seen and examined this morning. I discussed patient with Dr. Wynetta Emery and agree with his findings and plan. I discussed with patient again the role of androgen deprivation in Stage IV prostate cancer and the nature, potential benefits, risks and alternatives to bilateral orchiectomy, including side effects of the proposed treatment, the likelihood of the patient achieving the goals of the procedure, and any potential problems that might occur during the procedure or recuperation. We discussed alternatives such as serial office injections of [place] agonist or antagonists. All questions answered. Patient elects to proceed with bilateral orchiectomy.

## 2014-02-05 NOTE — Anesthesia Postprocedure Evaluation (Signed)
  Anesthesia Post-op Note  Patient: Jeff [name]  Procedure(s) Performed: Procedure(s) (LRB): BILATERAL ORCHIECTOMY (Bilateral)  Patient Location: PACU  Anesthesia Type: General  Level of Consciousness: awake and alert   Airway and Oxygen Therapy: Patient Spontanous Breathing  Post-op Pain: mild  Post-op Assessment: Post-op Vital signs reviewed, Patient's Cardiovascular Status Stable, Respiratory Function Stable, Patent Airway and No signs of Nausea or vomiting  Last Vitals:  Filed Vitals:   02/05/14 1330  BP: 99/67  Pulse: 65  Temp: 36.4 C  Resp: 15    Post-op Vital Signs: stable   Complications: No apparent anesthesia complications

## 2014-02-06 ENCOUNTER — Encounter: Payer: Self-pay | Admitting: Family Medicine

## 2014-02-06 ENCOUNTER — Ambulatory Visit: Payer: Medicare Other | Attending: Family Medicine | Admitting: Family Medicine

## 2014-02-06 VITALS — BP 110/53 | HR 70 | Temp 98.1°F | Resp 18 | Ht 72.0 in | Wt 161.0 lb

## 2014-02-06 DIAGNOSIS — Z9079 Acquired absence of other genital organ(s): Secondary | ICD-10-CM | POA: Diagnosis not present

## 2014-02-06 DIAGNOSIS — Z720 Tobacco use: Secondary | ICD-10-CM | POA: Diagnosis not present

## 2014-02-06 DIAGNOSIS — C799 Secondary malignant neoplasm of unspecified site: Secondary | ICD-10-CM | POA: Diagnosis not present

## 2014-02-06 DIAGNOSIS — N1339 Other hydronephrosis: Secondary | ICD-10-CM | POA: Insufficient documentation

## 2014-02-06 DIAGNOSIS — C61 Malignant neoplasm of prostate: Secondary | ICD-10-CM | POA: Diagnosis not present

## 2014-02-06 DIAGNOSIS — N179 Acute kidney failure, unspecified: Secondary | ICD-10-CM | POA: Insufficient documentation

## 2014-02-06 DIAGNOSIS — Z936 Other artificial openings of urinary tract status: Secondary | ICD-10-CM | POA: Diagnosis not present

## 2014-02-06 DIAGNOSIS — Z0000 Encounter for general adult medical examination without abnormal findings: Secondary | ICD-10-CM | POA: Insufficient documentation

## 2014-02-06 DIAGNOSIS — R739 Hyperglycemia, unspecified: Secondary | ICD-10-CM | POA: Diagnosis not present

## 2014-02-06 DIAGNOSIS — E780 Pure hypercholesterolemia: Secondary | ICD-10-CM | POA: Diagnosis not present

## 2014-02-06 DIAGNOSIS — R7309 Other abnormal glucose: Secondary | ICD-10-CM | POA: Insufficient documentation

## 2014-02-06 LAB — BASIC METABOLIC PANEL
BUN: 23 mg/dL (ref 6–23)
CO2: 26 mEq/L (ref 19–32)
Calcium: 9 mg/dL (ref 8.4–10.5)
Chloride: 101 mEq/L (ref 96–112)
Creat: 1.39 mg/dL — ABNORMAL HIGH (ref 0.50–1.35)
Glucose, Bld: 81 mg/dL (ref 70–99)
POTASSIUM: 4.7 meq/L (ref 3.5–5.3)
Sodium: 136 mEq/L (ref 135–145)

## 2014-02-06 LAB — POCT GLYCOSYLATED HEMOGLOBIN (HGB A1C): Hemoglobin A1C: 5.9

## 2014-02-06 NOTE — Assessment & Plan Note (Addendum)
A: in the setting of urine output obstruction s/p nephrostomy tube placement  P:  F/u BMP

## 2014-02-06 NOTE — Assessment & Plan Note (Signed)
Screening A1c normal

## 2014-02-06 NOTE — Patient Instructions (Signed)
[name],  Thank you for coming in today. It was a pleasure meeting you. I look forward to being your primary doctor.   Please continue current medication regimen.  Plan to f/u with me in 2-3 months sooner if needed  Dr. Adrian Blackwater

## 2014-02-06 NOTE — Progress Notes (Signed)
Establish Care HFU 

## 2014-02-06 NOTE — Progress Notes (Signed)
Patient ID: Ezzard Ditmer, male   DOB: 06-16-1945, 68 y.o.   MRN: [id number]   Ranard Harte, is a 68 y.o. male  TKP:[id number]  [id number]:[id number]  DOB - May 20, 1945  CC:  Chief Complaint  Patient presents with  . Establish Care  . Hospitalization Follow-up       HPI: Eliud Polo is a 68 y.o. male here today to establish medical care.  He is accompanied by a staff member from [facility].  He has no pertinent PMH or PFH.  He presented to [facility] on 10/3 with complaints of lower back pain and difficulty urinating.  Work-up revealed prostate cancer with metastasis to multiple sites.  He had bilateral nephrostomy tubes placed for hydronephrosis.  He was discharged on 10/9.  On 10/13 he underwent bilateral orchiectomy.  He has no complaints today.    Social hx: He currently smokes 0.25 ppd and drinks 1 beer/week  No Known Allergies Past Medical History  Diagnosis Date  . Acute renal failure 01/26/2014  . Anemia of chronic disease 01/26/2014  . Bilateral hydronephrosis 01/26/2014  . History of cocaine abuse 01/26/2014  . Homelessness 01/31/2014  . 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 Alcohol abuse     Review of Systems  Constitutional: Negative.   HENT: Negative.   Eyes: Negative.   Respiratory: Negative.   Cardiovascular: Negative.   Gastrointestinal: Negative.   Genitourinary: Negative.   Musculoskeletal: Negative.   Neurological: Negative.   Psychiatric/Behavioral: Negative.      Objective:   Filed Vitals:   02/06/14 0941  BP: 110/53  Pulse: 70  Temp: 98.1 F (36.7 C)  Resp: 18    Physical Exam: Constitutional: Patient appears well-developed. Cachetic.  No distress. HENT: Normocephalic, atraumatic, External right and left ear normal. Oropharynx is clear and moist.  Eyes: Conjunctivae and EOM are normal. PERRLA, no scleral icterus. Neck: Normal ROM. Neck supple. No JVD. No tracheal deviation. No thyromegaly. CVS: RRR, S1/S2 +, no murmurs, no  gallops, no carotid bruit.  Pulmonary: Effort and breath sounds normal, no stridor, rhonchi, wheezes, rales.  Abdominal: Soft. BS +, no distension, tenderness, rebound or guarding. Bilateral nephrostomy tubes in place without erythema or drainage.  Clear fluid noted in bilateral nephrostomy drainage bags. Musculoskeletal: Normal range of motion. No edema and no tenderness.  Lymphadenopathy: No lymphadenopathy noted, cervical, inguinal or axillary Neuro: Alert. Normal reflexes, muscle tone coordination. No cranial nerve deficit. Skin: Skin is warm and dry. No rash noted. Not diaphoretic. No erythema. No pallor. Psychiatric: Normal mood and affect. Behavior, judgment, thought content normal.     Assessment and plan:   1. Acute renal failure, unspecified acute renal failure type  - Basic Metabolic Panel to f/u on ARF during hospitalization  2. Elevated glucose  -A1C to eval diabetes. Pt had randomly elevated blood glucose readings while admitted.  - HgB A1c  3. Health care maintenance  Lipid panel to assess hypercholesterolemia  The patient has follow up established with urologist and oncologist later this month to determine plan of care and chemo schedule.   F/u: 6-8 weeks for physical  The patient was given clear instructions to go to ER or return to medical center if symptoms don't improve, worsen or new problems develop. The patient verbalized understanding. The patient was told to call to get lab results if they haven't heard anything in the next week.     This note has been created with Surveyor, quantity. Any  transcriptional errors are unintentional.    Sohan Potvin, FNP-student  02/06/2014, 10:13 AM  Attending Addendum  I examined the patient and discussed the assessment and plan with FNP student  Kyion Gautier.  I have reviewed the note and agree.   Briefly, 68 yo M presents to establish care. He was recently dx  with metastatic prostate cancer after presenting to ED with AKI. He is s/p b/l nephrostomy tube placement and orchiectomy. He is pain free except for mild L low back pain. No fever, chills. Appetite is good.  BP 110/53  Pulse 70  Temp(Src) 98.1 F (36.7 C) (Oral)  Resp 18  Ht 6' (1.829 m)  Wt 161 lb (73.029 kg)  BMI 21.83 kg/m2  SpO2 100% General appearance: alert, cooperative and no distress, thin elderly black male  Back: nephrostomy tube sites with wounds that are clean dry and intact  Lungs: clear to auscultation bilaterally Heart: regular rate and rhythm, S1, S2 normal, no murmur, click, rub or gallop Extremities: extremities normal, atraumatic, no cyanosis or edema     Boykin Nearing, MD [place]

## 2014-02-06 NOTE — Assessment & Plan Note (Signed)
A: pain well controlled P:  Continue current regimen Patient to plan and start chemotherapy

## 2014-02-08 NOTE — Op Note (Signed)
I was present and scrubbed for the entire procedure.

## 2014-02-12 ENCOUNTER — Encounter: Payer: Self-pay | Admitting: Oncology

## 2014-02-12 ENCOUNTER — Ambulatory Visit (HOSPITAL_BASED_OUTPATIENT_CLINIC_OR_DEPARTMENT_OTHER): Payer: Medicare Other | Admitting: Oncology

## 2014-02-12 ENCOUNTER — Ambulatory Visit: Payer: Medicare Other

## 2014-02-12 ENCOUNTER — Other Ambulatory Visit: Payer: Medicare Other

## 2014-02-12 VITALS — BP 132/74 | HR 76 | Temp 97.9°F | Resp 18 | Ht 72.0 in | Wt 156.0 lb

## 2014-02-12 DIAGNOSIS — N178 Other acute kidney failure: Secondary | ICD-10-CM

## 2014-02-12 DIAGNOSIS — N1330 Unspecified hydronephrosis: Secondary | ICD-10-CM

## 2014-02-12 DIAGNOSIS — C61 Malignant neoplasm of prostate: Secondary | ICD-10-CM

## 2014-02-12 DIAGNOSIS — C7951 Secondary malignant neoplasm of bone: Secondary | ICD-10-CM

## 2014-02-12 NOTE — Progress Notes (Signed)
Please consult note.

## 2014-02-12 NOTE — Consult Note (Signed)
Reason for Referral: Prostate cancer.  HPI: 68 year old gentleman native [place] where he lived the majority of his life. He gentleman with polysubstance abuse and poor social situation and support was hospitalized on 01/27/2014 with complaints of abdominal pain. At that time he was found to have massive bilateral retroperitoneal lymphadenopathy, sclerotic bony lesions and obstructive uropathy. His PSA was over 5000 indicating potentially advanced prostate cancer. On 01/28/2014 he underwent a biopsy of his abdominal mass which showed adenocarcinoma likely of a prostate etiology. He subsequently underwent   percutaneous nephrostomy tube on 01/28/2014. His creatinine normalized 1.6-1.39. And on 02/05/2014 he underwent bilateral orchiectomy for androgen depravation. Given his poor social situation, he was discharged to a skilled nursing facility where he is currently residing.   Since his discharge, he feels very well. He has no complaints at this time. He reports no headaches, blurred vision, double vision. He does not report any syncope or seizures. Has not reported any fevers, chills, sweats, weight loss or appetite changes. He reports no chest pain, palpitation, orthopnea or leg edema. He is not reporting any shortness of breath, wheezing or cough. She does not report any nausea, vomiting, hematochezia, melena. He does not report any frequency, urgency or hesitancy. He still has nephrostomy tubes and draining properly. He did not report any skeletal complaints of arthralgias or myalgias. He is a report any back pain shoulder pain or any hip pain. He does not report any lymphadenopathy or petechiae. Rest of his review of systems unremarkable.   Past Medical History  Diagnosis Date  . Acute renal failure 01/26/2014  . Anemia of chronic disease 01/26/2014  . Bilateral hydronephrosis 01/26/2014  . History of cocaine abuse 01/26/2014  . Homelessness 01/31/2014  . Prostate cancer metastatic to  multiple sites 01/30/2014  . Alcohol abuse   :  Past Surgical History  Procedure Laterality Date  . Orchiectomy Bilateral 02/05/2014    Procedure: BILATERAL ORCHIECTOMY;  Surgeon: Festus Aloe, MD;  Location: [facility];  Service: Urology;  Laterality: Bilateral;  :  Current Outpatient Prescriptions  Medication Sig Dispense Refill  . acetaminophen (TYLENOL) 325 MG tablet Take 2 tablets (650 mg total) by mouth every 6 (six) hours as needed for mild pain (or Fever >/= 101).      . calcium-vitamin D (OSCAL 500/200 D-3) 500-200 MG-UNIT per tablet Take 1 tablet by mouth 2 (two) times daily.  60 tablet  2  . HYDROcodone-acetaminophen (NORCO) 5-325 MG per tablet Take 1 tablet by mouth every 6 (six) hours as needed for moderate pain.  12 tablet  0  . traMADol (ULTRAM) 50 MG tablet Take 1 tablet (50 mg total) by mouth every 6 (six) hours as needed.  30 tablet  0   No current facility-administered medications for this visit.       No Known Allergies:  Family History  Problem Relation Age of Onset  . Cancer Brother 87  :  History   Social History  . Marital Status: Single    Spouse Name: N/A    Number of Children: 1  . Years of Education: 9th   Occupational History  . disabilty    Social History Main Topics  . Smoking status: Current Every Day Smoker -- 0.25 packs/day for 55 years    Types: Cigarettes  . Smokeless tobacco: Never Used  . Alcohol Use: 0.6 oz/week    1 Cans of beer per week     Comment: occassional 2-3x week  . Drug Use: Yes  Special: Cocaine     Comment: last used Cocaine 01/25/14  . Sexual Activity: Yes   Other Topics Concern  . Not on file   Social History Narrative   Lives at [employer]   1 son, lives in [place], [place]   Can read and write in native language           :  Pertinent items are noted in HPI.  Exam: ECOG 1 Blood pressure 132/74, pulse 76, temperature 97.9 F (36.6 C), temperature source Oral, resp.  rate 18, height 6' (1.829 m), weight 156 lb (70.761 kg). General appearance: alert and cooperative Head: Normocephalic, without obvious abnormality Throat: lips, mucosa, and tongue normal; teeth and gums normal Neck: no adenopathy Back: negative Resp: clear to auscultation bilaterally Cardio: regular rate and rhythm, S1, S2 normal, no murmur, click, rub or gallop GI: soft, non-tender; bowel sounds normal; no masses,  no organomegaly Extremities: extremities normal, atraumatic, no cyanosis or edema Pulses: 2+ and symmetric     Ct Chest Wo Contrast  01/26/2014   CLINICAL DATA:  Nodules demonstrated in the lung bases on abdominal CT scan of earlier today. The patient has known massive intraperitoneal and retroperitoneal lymphadenopathy and sclerotic bone lesions  EXAM: CT CHEST WITHOUT CONTRAST  TECHNIQUE: Multidetector CT imaging of the chest was performed following the standard protocol without IV contrast.  COMPARISON:  Abdominal pelvic CT scan of today's date  FINDINGS: The cardiac chambers are normal. The caliber of the thoracic aorta is normal. There is no mediastinal nor hilar lymphadenopathy. There is no pleural nor pericardial effusion.  At lung window settings there are emphysematous changes bilaterally. There are subtle areas of ill-defined nodularity in both lungs. There are a few small patchy subpleural and central foci of interstitial density which are nonspecific. There are no air bronchograms. There is no bronchiectasis.  There are sclerotic lesions throughout the thoracic spine as well as within multiple ribs and in the scapulae and in the left humeral head. There is a well-defined subcentimeter soft tissue density nodule just above the dome of the left hemidiaphragm. There is no interstitial or alveolar infiltrate.  IMPRESSION: 1. There are a few scattered subpleural and more central foci of increased interstitial density which are nonspecific but could reflect pneumonitis. Subtle  subpleural subcentimeter nodules are nonspecific and most likely inflammatory. There is no malignant-appearing parenchymal mass. There is no mediastinal nor hilar lymphadenopathy. There is no pleural nor pericardial effusion. 2. There are widespread sclerotic lesions of the bone consistent with metastatic disease.   Electronically Signed   By: Willi  Martinique   On: 01/26/2014 14:49   Ct Renal Stone Study  01/26/2014   CLINICAL DATA:  Right-sided flank pain, nausea, and is curious starting at 9:30 p.m. tonight. Small amount of hematuria and burning.  EXAM: CT ABDOMEN AND PELVIS WITHOUT CONTRAST  TECHNIQUE: Multidetector CT imaging of the abdomen and pelvis was performed following the standard protocol without IV contrast.  COMPARISON:  None.  FINDINGS: Emphysematous changes in the lung bases with patchy nodular ground-glass opacities, indeterminate. Small esophageal hiatal hernia.  Evaluation is limited due to noncontrast technique, but there appears to be massive retroperitoneal lymphadenopathy, extending into the pelvis all the way down to the level of the symphysis pubis. Largest cross-sectional area measures about 7.2 x 12.9 cm transverse dimension. The multifocal nodular areas of bone sclerosis demonstrated throughout the visualized spine, ribs, sacrum, pelvis, and hips. Findings likely to represent lymphoma although  extensive metastatic disease could also potentially have this appearance. There is a bilateral renal hydronephrosis and hydroureter, greater on the left side. This is likely due to extrinsic compression by the prominent lymphadenopathy. The bladder is decompressed and is displaced anteriorly and laterally. Bladder wall is thickened, possibly due decompression or infection. The unenhanced appearance of the liver, spleen, gallbladder, and adrenal glands is unremarkable. Aorta, inferior vena cava, and pancreas cannot be evaluated due to lymphadenopathy.  IMPRESSION: Massive retroperitoneal and pelvic  lymphadenopathy, diffuse sclerotic bone lesions, and indeterminate small nodular changes in the lungs likely representing lymphoma but extensive metastatic disease is also possible. There is bilateral hydronephrosis caused by extrinsic compression of the ureters. Bladder is displaced. Small esophageal hiatal hernia.   Electronically Signed   By: Lucienne Capers M.D.   On: 01/26/2014 02:36    Assessment and Plan:   68 year old gentleman with the following issues:  1. Advanced prostate cancer presented with massive retroperitoneal and pelvic adenopathy and diffuse sclerotic bony lesions diagnosed in October of 2015. His PSA is above 5000 but unknown Gleason score at this time. The natural course of this disease was discussed with the patient today extensively. The treatment options discussed with the patient today and first-line treatment is certainly androgen deprivation. He is status post bilateral orchiectomy which is the preferred method in his care. The role of systemic chemotherapy in the form of Taxotere for a total of 6 cycles was discussed today extensively. I explained to him that the role of systemic chemotherapy has changed recently and removed from the castration resistant disease to the hormone nave stage based on recent data that have supported improved survival when used upfront. I feel that his disease is rather aggressive and would benefit from systemic chemotherapy upfront rather than waiting to he develops castration resistant. Risks and benefits of systemic chemotherapy discussed extensively today. This will include nausea, vomiting, myelosuppression, neutropenia, neutropenic sepsis. The benefit would include extending survival close to 10-13 months on average based on recent data.  I think he is medically fit to withstand chemotherapy but I think his social situation is a hindrance. He is currently resides in a skilled nursing facility closer to her  and am not quite sure  how long he will stay there. He has family in his planning to stay with upon his discharge. He is not sure whether he will be able to come on a regular basis to receive chemotherapy here in [place]. He will need Neulasta injection after each chemotherapy infusion and possibly a Port-A-Cath insertion.  I gave him the option of establishing care at [facility] which might be easier for him to followup and receive his treatment there. I explained to them that chemotherapy would benefit him but I think he has time to get that started in the next one to 2 months. He prefers a followup at [facility] and I will set that up for him today.  I will continue to be happy to assist in his care at any point.  2. Androgen depravation: He is status post bilateral orchiectomy and I would not add any androgen deprivation at this time.  3. Acute renal failure due to hydronephrosis: He is status post bilateral nephrostomy tubes placement and follows up with urology in the future for possible internalization. His last creatinine have normalized around 1.3.  4. Bone directed therapy: I think you will benefit from Index the at some point upon starting systemic chemotherapy.  All his questions were  answered today to his satisfaction.

## 2014-02-12 NOTE — Progress Notes (Signed)
Checked in new patient with no financial issues prior to seeing the dr. He resides at [facility] rest home, address on file is for his mail. I gave him my card and application for medicaid. He has not been traveling and has appt card.

## 2014-02-14 ENCOUNTER — Ambulatory Visit (HOSPITAL_COMMUNITY): Payer: Medicare Other

## 2014-02-15 NOTE — Progress Notes (Signed)
This encounter was created in error - please disregard.

## 2014-02-18 ENCOUNTER — Telehealth: Payer: Self-pay | Admitting: *Deleted

## 2014-02-18 NOTE — Telephone Encounter (Signed)
Message copied by Betti Cruz on Mon Feb 18, 2014  2:37 PM ------      Message from: Boykin Nearing      Created: Tue Feb 12, 2014 10:26 AM       Creatinine improving. ------

## 2014-02-18 NOTE — Telephone Encounter (Signed)
Pt aware of Lab results

## 2014-02-25 ENCOUNTER — Other Ambulatory Visit: Payer: Self-pay | Admitting: Urology

## 2014-02-25 DIAGNOSIS — C61 Malignant neoplasm of prostate: Secondary | ICD-10-CM

## 2014-02-26 ENCOUNTER — Encounter (HOSPITAL_COMMUNITY): Payer: Self-pay

## 2014-02-26 ENCOUNTER — Encounter (HOSPITAL_COMMUNITY): Payer: PRIVATE HEALTH INSURANCE | Attending: Hematology and Oncology

## 2014-02-26 VITALS — BP 112/75 | HR 80 | Temp 97.5°F | Resp 16 | Ht 68.5 in | Wt 155.6 lb

## 2014-02-26 DIAGNOSIS — C779 Secondary and unspecified malignant neoplasm of lymph node, unspecified: Secondary | ICD-10-CM | POA: Diagnosis not present

## 2014-02-26 DIAGNOSIS — R590 Localized enlarged lymph nodes: Secondary | ICD-10-CM

## 2014-02-26 DIAGNOSIS — J449 Chronic obstructive pulmonary disease, unspecified: Secondary | ICD-10-CM

## 2014-02-26 DIAGNOSIS — Z87898 Personal history of other specified conditions: Secondary | ICD-10-CM | POA: Diagnosis not present

## 2014-02-26 DIAGNOSIS — C7951 Secondary malignant neoplasm of bone: Secondary | ICD-10-CM | POA: Insufficient documentation

## 2014-02-26 DIAGNOSIS — C772 Secondary and unspecified malignant neoplasm of intra-abdominal lymph nodes: Secondary | ICD-10-CM

## 2014-02-26 DIAGNOSIS — C61 Malignant neoplasm of prostate: Secondary | ICD-10-CM | POA: Diagnosis present

## 2014-02-26 LAB — CBC WITH DIFFERENTIAL/PLATELET
Basophils Absolute: 0 10*3/uL (ref 0.0–0.1)
Basophils Relative: 0 % (ref 0–1)
Eosinophils Absolute: 0.3 10*3/uL (ref 0.0–0.7)
Eosinophils Relative: 4 % (ref 0–5)
HEMATOCRIT: 31.2 % — AB (ref 39.0–52.0)
Hemoglobin: 10.4 g/dL — ABNORMAL LOW (ref 13.0–17.0)
LYMPHS PCT: 18 % (ref 12–46)
Lymphs Abs: 1.4 10*3/uL (ref 0.7–4.0)
MCH: 29.1 pg (ref 26.0–34.0)
MCHC: 33.3 g/dL (ref 30.0–36.0)
MCV: 87.2 fL (ref 78.0–100.0)
MONO ABS: 0.6 10*3/uL (ref 0.1–1.0)
Monocytes Relative: 8 % (ref 3–12)
NEUTROS ABS: 5.6 10*3/uL (ref 1.7–7.7)
Neutrophils Relative %: 70 % (ref 43–77)
Platelets: 474 10*3/uL — ABNORMAL HIGH (ref 150–400)
RBC: 3.58 MIL/uL — ABNORMAL LOW (ref 4.22–5.81)
RDW: 14.3 % (ref 11.5–15.5)
WBC: 7.9 10*3/uL (ref 4.0–10.5)

## 2014-02-26 LAB — COMPREHENSIVE METABOLIC PANEL
ALK PHOS: 323 U/L — AB (ref 39–117)
ALT: 13 U/L (ref 0–53)
AST: 18 U/L (ref 0–37)
Albumin: 3.1 g/dL — ABNORMAL LOW (ref 3.5–5.2)
Anion gap: 14 (ref 5–15)
BUN: 26 mg/dL — ABNORMAL HIGH (ref 6–23)
CHLORIDE: 97 meq/L (ref 96–112)
CO2: 25 meq/L (ref 19–32)
Calcium: 9.4 mg/dL (ref 8.4–10.5)
Creatinine, Ser: 1.67 mg/dL — ABNORMAL HIGH (ref 0.50–1.35)
GFR calc Af Amer: 47 mL/min — ABNORMAL LOW (ref >90)
GFR, EST NON AFRICAN AMERICAN: 41 mL/min — AB (ref >90)
Glucose, Bld: 110 mg/dL — ABNORMAL HIGH (ref 70–99)
Potassium: 4.9 mEq/L (ref 3.7–5.3)
Sodium: 136 mEq/L — ABNORMAL LOW (ref 137–147)
Total Protein: 8.3 g/dL (ref 6.0–8.3)

## 2014-02-26 NOTE — Patient Instructions (Addendum)
[facility] Discharge Instructions  RECOMMENDATIONS MADE BY THE CONSULTANT AND ANY TEST RESULTS WILL BE SENT TO YOUR REFERRING PHYSICIAN.  EXAM FINDINGS BY THE PHYSICIAN TODAY AND SIGNS OR SYMPTOMS TO REPORT TO CLINIC OR PRIMARY PHYSICIAN: Exam and findings as discussed by Dr. Barnet Glasgow.  You need chemotherapy.  Will get your scheduled for port a cath insertion, chemo teaching and plans are to start chemotherapy on 11/19 if possible.  You will have chemotherapy every 3 weeks for 6 cycles and the day after each chemo treatment you will be given a shot called neulasta to help keep your white blood count up and reduce your risk of getting an infection.    INSTRUCTIONS/FOLLOW-UP: Port a catheter placement - someone from Interventional Radiology will contact you with the date, time and location of this procedure. Chemotherapy teaching Chemotherapy and office visit 03/14/14.  Thank you for choosing [facility] to provide your oncology and hematology care.  To afford each patient quality time with our providers, please arrive at least 15 minutes before your scheduled appointment time.  With your help, our goal is to use those 15 minutes to complete the necessary work-up to ensure our physicians have the information they need to help with your evaluation and healthcare recommendations.    Effective January 1st, 2014, we ask that you re-schedule your appointment with our physicians should you arrive 10 or more minutes late for your appointment.  We strive to give you quality time with our providers, and arriving late affects you and other patients whose appointments are after yours.    Again, thank you for choosing [facility].  Our hope is that these requests will decrease the amount of time that you wait before being seen by our physicians.       _____________________________________________________________  Should you have questions after your visit to  [facility], please contact our office at [phone] between the hours of 8:30 a.m. and 4:30 p.m.  Voicemails left after 4:30 p.m. will not be returned until the following business day.  For prescription refill requests, have your pharmacy contact our office with your prescription refill request.    _______________________________________________________________  We hope that we have given you very good care.  You may receive a patient satisfaction survey in the mail, please complete it and return it as soon as possible.  We value your feedback!  _______________________________________________________________  Have you asked about our STAR program?  STAR stands for Survivorship Training and Rehabilitation, and this is a nationally recognized cancer care program that focuses on survivorship and rehabilitation.  Cancer and cancer treatments may cause problems, such as, pain, making you feel tired and keeping you from doing the things that you need or want to do. Cancer rehabilitation can help. Our goal is to reduce these troubling effects and help you have the best quality of life possible.  You may receive a survey from a nurse that asks questions about your current state of health.  Based on the survey results, all eligible patients will be referred to the [facility] program for an evaluation so we can better serve you!  A frequently asked questions sheet is available upon request. Docetaxel injection What is this medicine? DOCETAXEL (doe se TAX el) is a chemotherapy drug. It targets fast dividing cells, like cancer cells, and causes these cells to die. This medicine is used to treat many types of cancers like breast cancer, certain stomach cancers, head and  neck cancer, lung cancer, and prostate cancer. This medicine may be used for other purposes; ask your health care provider or pharmacist if you have questions. COMMON BRAND NAME(S): Docefrez, Taxotere What should I tell my health  care provider before I take this medicine? They need to know if you have any of these conditions: -infection (especially a virus infection such as chickenpox, cold sores, or herpes) -liver disease -low blood counts, like low white cell, platelet, or red cell counts -an unusual or allergic reaction to docetaxel, polysorbate 80, other chemotherapy agents, other medicines, foods, dyes, or preservatives -pregnant or trying to get pregnant -breast-feeding How should I use this medicine? This drug is given as an infusion into a vein. It is administered in a hospital or clinic by a specially trained health care professional. Talk to your pediatrician regarding the use of this medicine in children. Special care may be needed. Overdosage: If you think you have taken too much of this medicine contact a poison control center or emergency room at once. NOTE: This medicine is only for you. Do not share this medicine with others. What if I miss a dose? It is important not to miss your dose. Call your doctor or health care professional if you are unable to keep an appointment. What may interact with this medicine? -cyclosporine -erythromycin -ketoconazole -medicines to increase blood counts like filgrastim, pegfilgrastim, sargramostim -vaccines Talk to your doctor or health care professional before taking any of these medicines: -acetaminophen -aspirin -ibuprofen -ketoprofen -naproxen This list may not describe all possible interactions. Give your health care provider a list of all the medicines, herbs, non-prescription drugs, or dietary supplements you use. Also tell them if you smoke, drink alcohol, or use illegal drugs. Some items may interact with your medicine. What should I watch for while using this medicine? Your condition will be monitored carefully while you are receiving this medicine. You will need important blood work done while you are taking this medicine. This drug may make you feel  generally unwell. This is not uncommon, as chemotherapy can affect healthy cells as well as cancer cells. Report any side effects. Continue your course of treatment even though you feel ill unless your doctor tells you to stop. In some cases, you may be given additional medicines to help with side effects. Follow all directions for their use. Call your doctor or health care professional for advice if you get a fever, chills or sore throat, or other symptoms of a cold or flu. Do not treat yourself. This drug decreases your body's ability to fight infections. Try to avoid being around people who are sick. This medicine may increase your risk to bruise or bleed. Call your doctor or health care professional if you notice any unusual bleeding. Be careful brushing and flossing your teeth or using a toothpick because you may get an infection or bleed more easily. If you have any dental work done, tell your dentist you are receiving this medicine. Avoid taking products that contain aspirin, acetaminophen, ibuprofen, naproxen, or ketoprofen unless instructed by your doctor. These medicines may hide a fever. This medicine contains an alcohol in the product. You may get drowsy or dizzy. Do not drive, use machinery, or do anything that needs mental alertness until you know how this medicine affects you. Do not stand or sit up quickly, especially if you are an older patient. This reduces the risk of dizzy or fainting spells. Avoid alcoholic drinks Do not become pregnant while taking this medicine.  Women should inform their doctor if they wish to become pregnant or think they might be pregnant. There is a potential for serious side effects to an unborn child. Talk to your health care professional or pharmacist for more information. Do not breast-feed an infant while taking this medicine. What side effects may I notice from receiving this medicine? Side effects that you should report to your doctor or health care  professional as soon as possible: -allergic reactions like skin rash, itching or hives, swelling of the face, lips, or tongue -low blood counts - This drug may decrease the number of white blood cells, red blood cells and platelets. You may be at increased risk for infections and bleeding. -signs of infection - fever or chills, cough, sore throat, pain or difficulty passing urine -signs of decreased platelets or bleeding - bruising, pinpoint red spots on the skin, black, tarry stools, nosebleeds -signs of decreased red blood cells - unusually weak or tired, fainting spells, lightheadedness -breathing problems -fast or irregular heartbeat -low blood pressure -mouth sores -nausea and vomiting -pain, swelling, redness or irritation at the injection site -pain, tingling, numbness in the hands or feet -swelling of the ankle, feet, hands -weight gain Side effects that usually do not require medical attention (report to your prescriber or health care professional if they continue or are bothersome): -bone pain -complete hair loss including hair on your head, underarms, pubic hair, eyebrows, and eyelashes -diarrhea -excessive tearing -changes in the color of fingernails -loosening of the fingernails -nausea -muscle pain -red flush to skin -sweating -weak or tired This list may not describe all possible side effects. Call your doctor for medical advice about side effects. You may report side effects to FDA at 1-800-FDA-1088. Where should I keep my medicine? This drug is given in a hospital or clinic and will not be stored at home. NOTE: This sheet is a summary. It may not cover all possible information. If you have questions about this medicine, talk to your doctor, pharmacist, or health care provider.  2015, [employer] Standard. (2013-03-08 22:21:02) Pegfilgrastim injection What is this medicine? PEGFILGRASTIM (peg fil GRA stim) is a long-acting granulocyte colony-stimulating factor  that stimulates the growth of neutrophils, a type of white blood cell important in the body's fight against infection. It is used to reduce the incidence of fever and infection in patients with certain types of cancer who are receiving chemotherapy that affects the bone marrow. This medicine may be used for other purposes; ask your health care provider or pharmacist if you have questions. COMMON BRAND NAME(S): Neulasta What should I tell my health care provider before I take this medicine? They need to know if you have any of these conditions: -latex allergy -ongoing radiation therapy -sickle cell disease -skin reactions to acrylic adhesives (On-Body Injector only) -an unusual or allergic reaction to pegfilgrastim, filgrastim, other medicines, foods, dyes, or preservatives -pregnant or trying to get pregnant -breast-feeding How should I use this medicine? This medicine is for injection under the skin. If you get this medicine at home, you will be taught how to prepare and give the pre-filled syringe or how to use the On-body Injector. Refer to the patient Instructions for Use for detailed instructions. Use exactly as directed. Take your medicine at regular intervals. Do not take your medicine more often than directed. It is important that you put your used needles and syringes in a special sharps container. Do not put them in a trash can. If you do not have  a sharps container, call your pharmacist or healthcare provider to get one. Talk to your pediatrician regarding the use of this medicine in children. Special care may be needed. Overdosage: If you think you have taken too much of this medicine contact a poison control center or emergency room at once. NOTE: This medicine is only for you. Do not share this medicine with others. What if I miss a dose? It is important not to miss your dose. Call your doctor or health care professional if you miss your dose. If you miss a dose due to an On-body  Injector failure or leakage, a new dose should be administered as soon as possible using a single prefilled syringe for manual use. What may interact with this medicine? Interactions have not been studied. Give your health care provider a list of all the medicines, herbs, non-prescription drugs, or dietary supplements you use. Also tell them if you smoke, drink alcohol, or use illegal drugs. Some items may interact with your medicine. This list may not describe all possible interactions. Give your health care provider a list of all the medicines, herbs, non-prescription drugs, or dietary supplements you use. Also tell them if you smoke, drink alcohol, or use illegal drugs. Some items may interact with your medicine. What should I watch for while using this medicine? You may need blood work done while you are taking this medicine. If you are going to need a MRI, CT scan, or other procedure, tell your doctor that you are using this medicine (On-Body Injector only). What side effects may I notice from receiving this medicine? Side effects that you should report to your doctor or health care professional as soon as possible: -allergic reactions like skin rash, itching or hives, swelling of the face, lips, or tongue -dizziness -fever -pain, redness, or irritation at site where injected -pinpoint red spots on the skin -shortness of breath or breathing problems -stomach or side pain, or pain at the shoulder -swelling -tiredness -trouble passing urine Side effects that usually do not require medical attention (report to your doctor or health care professional if they continue or are bothersome): -bone pain -muscle pain This list may not describe all possible side effects. Call your doctor for medical advice about side effects. You may report side effects to FDA at 1-800-FDA-1088. Where should I keep my medicine? Keep out of the reach of children. Store pre-filled syringes in a refrigerator between  2 and 8 degrees C (36 and 46 degrees F). Do not freeze. Keep in carton to protect from light. Throw away this medicine if it is left out of the refrigerator for more than 48 hours. Throw away any unused medicine after the expiration date. NOTE: This sheet is a summary. It may not cover all possible information. If you have questions about this medicine, talk to your doctor, pharmacist, or health care provider.  2015, [employer] Standard. (2013-07-12 16:14:05)

## 2014-02-26 NOTE — Progress Notes (Signed)
Jeff [name] presented for labwork. Labs per MD order drawn via Peripheral Line 23 gauge needle inserted in right AC  Good blood return present. Procedure without incident.  Needle removed intact. Patient tolerated procedure well.

## 2014-02-27 LAB — PSA: PSA: 399.5 ng/mL — ABNORMAL HIGH (ref <=4.00)

## 2014-02-28 ENCOUNTER — Other Ambulatory Visit (HOSPITAL_COMMUNITY): Payer: Self-pay | Admitting: Hematology and Oncology

## 2014-02-28 NOTE — Progress Notes (Signed)
[place] A. Barnet Glasgow, M.D.  NEW PATIENT EVALUATION   Name: Jeff [name] Date: 02/28/2014 MRN: [id number] DOB: Jul 13, 1945  PCP: [name], MD   REFERRING PHYSICIAN: Wyatt Portela, MD  REASON FOR REFERRAL: stage IV prostate cancer, status post orchiectomy.      HISTORY OF PRESENT ILLNESS:Jeff [name] is a 68 y.o. 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 He was admitted to hospital on 01/27/2014 with abdominal pain and was found to have massive bilateral retroperitoneal lymphadenopathy, sclerotic bone lesions, and obstructive uropathy. PSA was over 5000. He underwent biopsy of which peritoneal mass on 01/28/2014 showing adenocarcinoma. He also underwent bilateral nephrostomy tube placement on 01/28/2014 with normalization of his creatinine. Currently feels well living in a controlled environment because of previous polysubstance abuse. Appetite is good with no nausea, vomiting, need for analgesics, diarrhea, constipation, dysuria, hematuria, and still with bilateral nephrostomies in place. He does make urine through his penis.   PAST MEDICAL HISTORY:  has a past medical history of Acute renal failure (01/26/2014); Anemia of chronic disease (01/26/2014); Bilateral hydronephrosis (01/26/2014); History of cocaine abuse (01/26/2014); Homelessness (01/31/2014); Prostate cancer metastatic to multiple sites (01/30/2014); and Alcohol abuse.     PAST SURGICAL HISTORY: Past Surgical History  Procedure Laterality Date  . Orchiectomy Bilateral 02/05/2014    Procedure: BILATERAL ORCHIECTOMY;  Surgeon: Festus Aloe, MD;  Location: [facility];  Service: Urology;  Laterality: Bilateral;     CURRENT MEDICATIONS: has a current medication list which includes the following  prescription(s): acetaminophen, calcium-vitamin d, tramadol, and hydrocodone-acetaminophen.   ALLERGIES: Review of patient's allergies indicates no known allergies.   SOCIAL HISTORY:  reports that he has been smoking Cigarettes.  He has a 13.75 pack-year smoking history. He has never used smokeless tobacco. He reports that he uses illicit drugs (Cocaine). He reports that he does not drink alcohol.   FAMILY HISTORY: family history includes Cancer (age of onset: 30) in his brother.    REVIEW OF SYSTEMS:  Other than that discussed above is noncontributory.    PHYSICAL EXAM:  height is 5' 8.5" (1.74 m) and weight is 155 lb 9.6 oz (70.58 kg). His oral temperature is 97.5 F (36.4 C). His blood pressure is 112/75 and his pulse is 80. His respiration is 16 and oxygen saturation is 100%.    GENERAL:alert, no distress and comfortable SKIN: skin color, texture, turgor are normal, no rashes or significant lesions EYES: normal, Conjunctiva are pink and non-injected, sclera clear OROPHARYNX:no exudate, no erythema and lips, buccal mucosa, and tongue normal  NECK: supple, thyroid normal size, non-tender, without nodularity CHEST: increased AP diameter with no gynecomastia. LYMPH:  no palpable lymphadenopathy in the cervical, axillary or inguinal LUNGS: clear to auscultation and percussion with normal breathing effort HEART: regular rate & rhythm and no murmurs ABDOMEN:abdomen soft, non-tender and normal bowel sounds MUSCULOSKELETALl:no cyanosis of digits, no clubbing or edema  NEURO: alert & oriented x 3 with fluent speech, no focal motor/sensory deficits    LABORATORY DATA:  Office Visit on 02/26/2014  Component Date Value Ref Range Status  . WBC 02/26/2014 7.9  4.0 - 10.5 K/uL Final  . RBC 02/26/2014 3.58* 4.22 - 5.81 MIL/uL Final  . Hemoglobin 02/26/2014 10.4* 13.0 - 17.0 g/dL Final  . HCT 02/26/2014 31.2* 39.0 -  52.0 % Final  . MCV 02/26/2014 87.2  78.0 - 100.0 fL Final  . MCH  02/26/2014 29.1  26.0 - 34.0 pg Final  . MCHC 02/26/2014 33.3  30.0 - 36.0 g/dL Final  . RDW 02/26/2014 14.3  11.5 - 15.5 % Final  . Platelets 02/26/2014 474* 150 - 400 K/uL Final  . Neutrophils Relative % 02/26/2014 70  43 - 77 % Final  . Neutro Abs 02/26/2014 5.6  1.7 - 7.7 K/uL Final  . Lymphocytes Relative 02/26/2014 18  12 - 46 % Final  . Lymphs Abs 02/26/2014 1.4  0.7 - 4.0 K/uL Final  . Monocytes Relative 02/26/2014 8  3 - 12 % Final  . Monocytes Absolute 02/26/2014 0.6  0.1 - 1.0 K/uL Final  . Eosinophils Relative 02/26/2014 4  0 - 5 % Final  . Eosinophils Absolute 02/26/2014 0.3  0.0 - 0.7 K/uL Final  . Basophils Relative 02/26/2014 0  0 - 1 % Final  . Basophils Absolute 02/26/2014 0.0  0.0 - 0.1 K/uL Final  . Sodium 02/26/2014 136* 137 - 147 mEq/L Final  . Potassium 02/26/2014 4.9  3.7 - 5.3 mEq/L Final  . Chloride 02/26/2014 97  96 - 112 mEq/L Final  . CO2 02/26/2014 25  19 - 32 mEq/L Final  . Glucose, Bld 02/26/2014 110* 70 - 99 mg/dL Final  . BUN 02/26/2014 26* 6 - 23 mg/dL Final  . Creatinine, Ser 02/26/2014 1.67* 0.50 - 1.35 mg/dL Final  . Calcium 02/26/2014 9.4  8.4 - 10.5 mg/dL Final  . Total Protein 02/26/2014 8.3  6.0 - 8.3 g/dL Final  . Albumin 02/26/2014 3.1* 3.5 - 5.2 g/dL Final  . AST 02/26/2014 18  0 - 37 U/L Final  . ALT 02/26/2014 13  0 - 53 U/L Final  . Alkaline Phosphatase 02/26/2014 323* 39 - 117 U/L Final  . Total Bilirubin 02/26/2014 <0.2* 0.3 - 1.2 mg/dL Final  . GFR calc non Af Amer 02/26/2014 41* >90 mL/min Final  . GFR calc Af Amer 02/26/2014 47* >90 mL/min Final   Comment: (NOTE) The eGFR has been calculated using the CKD EPI equation. This calculation has not been validated in all clinical situations. eGFR's persistently <90 mL/min signify possible Chronic Kidney Disease.   . Anion gap 02/26/2014 14  5 - 15 Final  . PSA 02/26/2014 399.50* <=4.00 ng/mL Final   Comment: (NOTE) Result repeated and verified. Test Methodology: ECLIA PSA  (Electrochemiluminescence Immunoassay) For PSA values from 2.5-4.0, particularly in younger men <13 years old, the AUA and NCCN suggest testing for % Free PSA (3515) and evaluation of the rate of increase in PSA (PSA velocity). Performed at [facility] Visit on 02/06/2014  Component Date Value Ref Range Status  . Sodium 02/06/2014 136  135 - 145 mEq/L Final  . Potassium 02/06/2014 4.7  3.5 - 5.3 mEq/L Final  . Chloride 02/06/2014 101  96 - 112 mEq/L Final  . CO2 02/06/2014 26  19 - 32 mEq/L Final  . Glucose, Bld 02/06/2014 81  70 - 99 mg/dL Final  . BUN 02/06/2014 23  6 - 23 mg/dL Final  . Creat 02/06/2014 1.39* 0.50 - 1.35 mg/dL Final  . Calcium 02/06/2014 9.0  8.4 - 10.5 mg/dL Final  . Hemoglobin A1C 02/06/2014 5.9   Final  Admission on 02/05/2014, Discharged on 02/05/2014  Component Date Value Ref Range Status  . WBC 02/05/2014 6.9  4.0 - 10.5 K/uL Final  . RBC 02/05/2014  3.25* 4.22 - 5.81 MIL/uL Final  . Hemoglobin 02/05/2014 9.6* 13.0 - 17.0 g/dL Final  . HCT 02/05/2014 28.9* 39.0 - 52.0 % Final  . MCV 02/05/2014 88.9  78.0 - 100.0 fL Final  . MCH 02/05/2014 29.5  26.0 - 34.0 pg Final  . MCHC 02/05/2014 33.2  30.0 - 36.0 g/dL Final  . RDW 02/05/2014 15.0  11.5 - 15.5 % Final  . Platelets 02/05/2014 315  150 - 400 K/uL Final  . Opiates 02/05/2014 NONE DETECTED  NONE DETECTED Final  . Cocaine 02/05/2014 NONE DETECTED  NONE DETECTED Final  . Benzodiazepines 02/05/2014 NONE DETECTED  NONE DETECTED Final  . Amphetamines 02/05/2014 NONE DETECTED  NONE DETECTED Final  . Tetrahydrocannabinol 02/05/2014 NONE DETECTED  NONE DETECTED Final  . Barbiturates 02/05/2014 NONE DETECTED  NONE DETECTED Final   Comment:                                 DRUG SCREEN FOR MEDICAL PURPOSES                          ONLY.  IF CONFIRMATION IS NEEDED                          FOR ANY PURPOSE, NOTIFY LAB                          WITHIN 5 DAYS.                                                           LOWEST DETECTABLE LIMITS                          FOR URINE DRUG SCREEN                          Drug Class       Cutoff (ng/mL)                          Amphetamine      1000                          Barbiturate      200                          Benzodiazepine   200                          Tricyclics       161                          Opiates          300                          Cocaine          300  THC              50  Admission on 01/25/2014, Discharged on 02/01/2014  Component Date Value Ref Range Status  . Color, Urine 01/25/2014 YELLOW  YELLOW Final  . APPearance 01/25/2014 CLEAR  CLEAR Final  . Specific Gravity, Urine 01/25/2014 1.015  1.005 - 1.030 Final  . pH 01/25/2014 5.5  5.0 - 8.0 Final  . Glucose, UA 01/25/2014 NEGATIVE  NEGATIVE mg/dL Final  . Hgb urine dipstick 01/25/2014 LARGE* NEGATIVE Final  . Bilirubin Urine 01/25/2014 NEGATIVE  NEGATIVE Final  . Ketones, ur 01/25/2014 NEGATIVE  NEGATIVE mg/dL Final  . Protein, ur 01/25/2014 100* NEGATIVE mg/dL Final  . Urobilinogen, UA 01/25/2014 0.2  0.0 - 1.0 mg/dL Final  . Nitrite 01/25/2014 NEGATIVE  NEGATIVE Final  . Leukocytes, UA 01/25/2014 NEGATIVE  NEGATIVE Final  . Squamous Epithelial / LPF 01/25/2014 RARE  RARE Final  . WBC, UA 01/25/2014 3-6  <3 WBC/hpf Final  . RBC / HPF 01/25/2014 11-20  <3 RBC/hpf Final  . Bacteria, UA 01/25/2014 FEW* RARE Final  . WBC 01/26/2014 6.3  4.0 - 10.5 K/uL Final  . RBC 01/26/2014 3.37* 4.22 - 5.81 MIL/uL Final  . Hemoglobin 01/26/2014 10.1* 13.0 - 17.0 g/dL Final  . HCT 01/26/2014 29.2* 39.0 - 52.0 % Final  . MCV 01/26/2014 86.6  78.0 - 100.0 fL Final  . MCH 01/26/2014 30.0  26.0 - 34.0 pg Final  . MCHC 01/26/2014 34.6  30.0 - 36.0 g/dL Final  . RDW 01/26/2014 14.8  11.5 - 15.5 % Final  . Platelets 01/26/2014 327  150 - 400 K/uL Final  . Neutrophils Relative % 01/26/2014 71  43 - 77 % Final  . Neutro Abs 01/26/2014 4.4  1.7 - 7.7 K/uL Final    . Lymphocytes Relative 01/26/2014 20  12 - 46 % Final  . Lymphs Abs 01/26/2014 1.3  0.7 - 4.0 K/uL Final  . Monocytes Relative 01/26/2014 8  3 - 12 % Final  . Monocytes Absolute 01/26/2014 0.5  0.1 - 1.0 K/uL Final  . Eosinophils Relative 01/26/2014 1  0 - 5 % Final  . Eosinophils Absolute 01/26/2014 0.1  0.0 - 0.7 K/uL Final  . Basophils Relative 01/26/2014 0  0 - 1 % Final  . Basophils Absolute 01/26/2014 0.0  0.0 - 0.1 K/uL Final  . Sodium 01/26/2014 136* 137 - 147 mEq/L Final  . Potassium 01/26/2014 4.4  3.7 - 5.3 mEq/L Final  . Chloride 01/26/2014 100  96 - 112 mEq/L Final  . CO2 01/26/2014 22  19 - 32 mEq/L Final  . Glucose, Bld 01/26/2014 84  70 - 99 mg/dL Final  . BUN 01/26/2014 17  6 - 23 mg/dL Final  . Creatinine, Ser 01/26/2014 1.56* 0.50 - 1.35 mg/dL Final  . Calcium 01/26/2014 9.2  8.4 - 10.5 mg/dL Final  . Total Protein 01/26/2014 7.4  6.0 - 8.3 g/dL Final  . Albumin 01/26/2014 3.6  3.5 - 5.2 g/dL Final  . AST 01/26/2014 32  0 - 37 U/L Final  . ALT 01/26/2014 7  0 - 53 U/L Final  . Alkaline Phosphatase 01/26/2014 137* 39 - 117 U/L Final  . Total Bilirubin 01/26/2014 0.3  0.3 - 1.2 mg/dL Final  . GFR calc non Af Amer 01/26/2014 44* >90 mL/min Final  . GFR calc Af Amer 01/26/2014 51* >90 mL/min Final   Comment: (NOTE)  The eGFR has been calculated using the CKD EPI equation.                          This calculation has not been validated in all clinical situations.                          eGFR's persistently <90 mL/min signify possible Chronic Kidney                          Disease.  . Anion gap 01/26/2014 14  5 - 15 Final  . Opiates 01/26/2014 NONE DETECTED  NONE DETECTED Final  . Cocaine 01/26/2014 POSITIVE* NONE DETECTED Final  . Benzodiazepines 01/26/2014 NONE DETECTED  NONE DETECTED Final  . Amphetamines 01/26/2014 NONE DETECTED  NONE DETECTED Final  . Tetrahydrocannabinol 01/26/2014 NONE DETECTED  NONE DETECTED Final  . Barbiturates  01/26/2014 NONE DETECTED  NONE DETECTED Final   Comment:                                 DRUG SCREEN FOR MEDICAL PURPOSES                          ONLY.  IF CONFIRMATION IS NEEDED                          FOR ANY PURPOSE, NOTIFY LAB                          WITHIN 5 DAYS.                                                          LOWEST DETECTABLE LIMITS                          FOR URINE DRUG SCREEN                          Drug Class       Cutoff (ng/mL)                          Amphetamine      1000                          Barbiturate      200                          Benzodiazepine   200                          Tricyclics       765                          Opiates          300  Cocaine          300                          THC              50  . Alcohol, Ethyl (B) 01/26/2014 <11  0 - 11 mg/dL Final   Comment:                                 LOWEST DETECTABLE LIMIT FOR                          SERUM ALCOHOL IS 11 mg/dL                          FOR MEDICAL PURPOSES ONLY  . PSA 01/26/2014 >5000.00* <=4.00 ng/mL Final   Comment: (NOTE)                          Result repeated and verified.                          Result confirmed by automatic dilution.                          Test Methodology: ECLIA PSA (Electrochemiluminescence Immunoassay)                          For PSA values from 2.5-4.0, particularly in younger men <60 years                          old, the AUA and NCCN suggest testing for % Free PSA (3515) and                          evaluation of the rate of increase in PSA (PSA velocity).                          Performed at [facility]  . HIV 1&2 Ab, 4th Generation 01/26/2014 NONREACTIVE  NONREACTIVE Final   Comment: (NOTE)                          A NONREACTIVE HIV Ag/Ab result does not exclude HIV infection since                          the time frame for seroconversion is variable. If acute HIV infection                           is suspected, a HIV-1 RNA Qualitative TMA test is recommended.                          HIV-1/2 Antibody Diff         Not indicated.                          HIV-1 RNA, Qual TMA  Not indicated.                          PLEASE NOTE: This information has been disclosed to you from records                          whose confidentiality may be protected by state law. If your state                          requires such protection, then the state law prohibits you from making                          any further disclosure of the information without the specific written                          consent of the person to whom it pertains, or as otherwise permitted                          by law. A general authorization for the release of medical or other                          information is NOT sufficient for this purpose.                          The performance of this assay has not been clinically validated in                          patients less than 30 years old.                          Performed at [facility]  . Glucose-Capillary 01/27/2014 85  70 - 99 mg/dL Final  . Comment 1 01/27/2014 Documented in Chart   Final  . Comment 2 01/27/2014 Notify RN   Final  . Fecal Occult Bld 01/26/2014 NEGATIVE  NEGATIVE Final  . Sodium 01/27/2014 136* 137 - 147 mEq/L Final  . Potassium 01/27/2014 4.5  3.7 - 5.3 mEq/L Final  . Chloride 01/27/2014 101  96 - 112 mEq/L Final  . CO2 01/27/2014 23  19 - 32 mEq/L Final  . Glucose, Bld 01/27/2014 106* 70 - 99 mg/dL Final  . BUN 01/27/2014 21  6 - 23 mg/dL Final  . Creatinine, Ser 01/27/2014 1.76* 0.50 - 1.35 mg/dL Final  . Calcium 01/27/2014 8.9  8.4 - 10.5 mg/dL Final  . GFR calc non Af Amer 01/27/2014 38* >90 mL/min Final  . GFR calc Af Amer 01/27/2014 44* >90 mL/min Final   Comment: (NOTE)                          The eGFR has been calculated using the CKD EPI equation.                          This calculation has not been validated  in all clinical situations.  eGFR's persistently <90 mL/min signify possible Chronic Kidney                          Disease.  . Anion gap 01/27/2014 12  5 - 15 Final  . WBC 01/28/2014 6.5  4.0 - 10.5 K/uL Final  . RBC 01/28/2014 2.86* 4.22 - 5.81 MIL/uL Final  . Hemoglobin 01/28/2014 8.6* 13.0 - 17.0 g/dL Final  . HCT 01/28/2014 25.3* 39.0 - 52.0 % Final  . MCV 01/28/2014 88.5  78.0 - 100.0 fL Final  . MCH 01/28/2014 30.1  26.0 - 34.0 pg Final  . MCHC 01/28/2014 34.0  30.0 - 36.0 g/dL Final  . RDW 01/28/2014 15.2  11.5 - 15.5 % Final  . Platelets 01/28/2014 285  150 - 400 K/uL Final  . Prothrombin Time 01/28/2014 15.2  11.6 - 15.2 seconds Final  . INR 01/28/2014 1.19  0.00 - 1.49 Final  . aPTT 01/28/2014 35  24 - 37 seconds Final  . Sodium 01/28/2014 137  137 - 147 mEq/L Final  . Potassium 01/28/2014 4.6  3.7 - 5.3 mEq/L Final  . Chloride 01/28/2014 103  96 - 112 mEq/L Final  . CO2 01/28/2014 22  19 - 32 mEq/L Final  . Glucose, Bld 01/28/2014 96  70 - 99 mg/dL Final  . BUN 01/28/2014 21  6 - 23 mg/dL Final  . Creatinine, Ser 01/28/2014 1.83* 0.50 - 1.35 mg/dL Final  . Calcium 01/28/2014 8.8  8.4 - 10.5 mg/dL Final  . GFR calc non Af Amer 01/28/2014 37* >90 mL/min Final  . GFR calc Af Amer 01/28/2014 42* >90 mL/min Final   Comment: (NOTE)                          The eGFR has been calculated using the CKD EPI equation.                          This calculation has not been validated in all clinical situations.                          eGFR's persistently <90 mL/min signify possible Chronic Kidney                          Disease.  . Anion gap 01/28/2014 12  5 - 15 Final  . Glucose-Capillary 01/28/2014 82  70 - 99 mg/dL Final  . Comment 1 01/28/2014 Documented in Chart   Final  . Comment 2 01/28/2014 Notify RN   Final  . Glucose-Capillary 01/29/2014 162* 70 - 99 mg/dL Final  . Comment 1 01/29/2014 Documented in Chart   Final  . Comment 2 01/29/2014 Notify  RN   Final  . Testosterone 01/29/2014 496  300 - 890 ng/dL Final   Comment: (NOTE)                                   Tanner Stage       Male              Male  I              < 30 ng/dL        < 10 ng/dL                                       II             < 150 ng/dL       < 30 ng/dL                                       III            100-320 ng/dL     < 35 ng/dL                                       IV             200-970 ng/dL     15-40 ng/dL                                       V/Adult        300-890 ng/dL     10-70 ng/dL                          Performed at [facility]  . Sodium 01/29/2014 134* 137 - 147 mEq/L Final  . Potassium 01/29/2014 4.6  3.7 - 5.3 mEq/L Final  . Chloride 01/29/2014 99  96 - 112 mEq/L Final  . CO2 01/29/2014 23  19 - 32 mEq/L Final  . Glucose, Bld 01/29/2014 117* 70 - 99 mg/dL Final  . BUN 01/29/2014 17  6 - 23 mg/dL Final  . Creatinine, Ser 01/29/2014 1.60* 0.50 - 1.35 mg/dL Final  . Calcium 01/29/2014 8.8  8.4 - 10.5 mg/dL Final  . GFR calc non Af Amer 01/29/2014 43* >90 mL/min Final  . GFR calc Af Amer 01/29/2014 50* >90 mL/min Final   Comment: (NOTE)                          The eGFR has been calculated using the CKD EPI equation.                          This calculation has not been validated in all clinical situations.                          eGFR's persistently <90 mL/min signify possible Chronic Kidney                          Disease.  . Anion gap 01/29/2014 12  5 - 15 Final  . Sodium 01/30/2014 135* 137 - 147 mEq/L Final  . Potassium 01/30/2014 4.7  3.7 - 5.3 mEq/L Final  . Chloride 01/30/2014 102  96 - 112 mEq/L Final  . CO2 01/30/2014 23  19 - 32 mEq/L Final  . Glucose, Bld 01/30/2014 85  70 - 99 mg/dL  Final  . BUN 01/30/2014 17  6 - 23 mg/dL Final  . Creatinine, Ser 01/30/2014 1.65* 0.50 - 1.35 mg/dL Final  . Calcium 01/30/2014 8.8  8.4 - 10.5 mg/dL Final  . GFR calc non Af Amer 01/30/2014  41* >90 mL/min Final  . GFR calc Af Amer 01/30/2014 48* >90 mL/min Final   Comment: (NOTE)                          The eGFR has been calculated using the CKD EPI equation.                          This calculation has not been validated in all clinical situations.                          eGFR's persistently <90 mL/min signify possible Chronic Kidney                          Disease.  . Anion gap 01/30/2014 10  5 - 15 Final  . Specimen Description 01/30/2014 KIDNEY RT   Final  . Special Requests 01/30/2014 NONE   Final  . Culture  Setup Time 01/30/2014    Final                   Value:01/30/2014 10:44                         Performed at [facility]  . [place]ony Count 01/30/2014    Final                   Value:NO GROWTH                         Performed at [facility]  . Culture 01/30/2014    Final                   Value:NO GROWTH                         Performed at [facility]  . Report Status 01/30/2014 01/31/2014 FINAL   Final  . Specimen Description 01/29/2014 KIDNEY LT    Final  . Special Requests 01/29/2014 NONE   Final  . Culture  Setup Time 01/29/2014    Final                   Value:01/30/2014 10:44                         Performed at [facility]  . Colony Count 01/29/2014    Final                   Value:NO GROWTH                         Performed at [facility]  . Culture 01/29/2014    Final                   Value:NO GROWTH                         Performed at [facility]  . Report Status 01/29/2014 01/31/2014 FINAL   Final  . Glucose-Capillary 01/30/2014 90  70 - 99 mg/dL Final  . Comment 1 01/30/2014 Documented in Chart   Final  . Comment 2 01/30/2014 Notify RN   Final  . Sodium 01/31/2014 135* 137 - 147 mEq/L Final  . Potassium 01/31/2014 4.8  3.7 - 5.3 mEq/L Final  . Chloride 01/31/2014 102  96 - 112 mEq/L Final  . CO2 01/31/2014 23  19 - 32 mEq/L Final  . Glucose, Bld 01/31/2014 98  70 - 99 mg/dL Final  .  BUN 01/31/2014 17  6 - 23 mg/dL Final  . Creatinine, Ser 01/31/2014 1.54* 0.50 - 1.35 mg/dL Final  . Calcium 01/31/2014 8.8  8.4 - 10.5 mg/dL Final  . GFR calc non Af Amer 01/31/2014 45* >90 mL/min Final  . GFR calc Af Amer 01/31/2014 52* >90 mL/min Final   Comment: (NOTE)                          The eGFR has been calculated using the CKD EPI equation.                          This calculation has not been validated in all clinical situations.                          eGFR's persistently <90 mL/min signify possible Chronic Kidney                          Disease.  . Anion gap 01/31/2014 10  5 - 15 Final  . Glucose-Capillary 01/31/2014 139* 70 - 99 mg/dL Final  . Comment 1 01/31/2014 Documented in Chart   Final  . Comment 2 01/31/2014 Notify RN   Final  . Sodium 02/01/2014 138  137 - 147 mEq/L Final  . Potassium 02/01/2014 5.0  3.7 - 5.3 mEq/L Final  . Chloride 02/01/2014 102  96 - 112 mEq/L Final  . CO2 02/01/2014 24  19 - 32 mEq/L Final  . Glucose, Bld 02/01/2014 92  70 - 99 mg/dL Final  . BUN 02/01/2014 18  6 - 23 mg/dL Final  . Creatinine, Ser 02/01/2014 1.52* 0.50 - 1.35 mg/dL Final  . Calcium 02/01/2014 8.9  8.4 - 10.5 mg/dL Final  . GFR calc non Af Amer 02/01/2014 46* >90 mL/min Final  . GFR calc Af Amer 02/01/2014 53* >90 mL/min Final   Comment: (NOTE)                          The eGFR has been calculated using the CKD EPI equation.                          This calculation has not been validated in all clinical situations.                          eGFR's persistently <90 mL/min signify possible Chronic Kidney                          Disease.  . Anion gap 02/01/2014 12  5 - 15 Final  . WBC 02/01/2014 7.9  4.0 - 10.5 K/uL Final  . RBC  02/01/2014 2.92* 4.22 - 5.81 MIL/uL Final  . Hemoglobin 02/01/2014 8.6* 13.0 - 17.0 g/dL Final  . HCT 02/01/2014 25.3* 39.0 - 52.0 % Final  . MCV 02/01/2014 86.6  78.0 - 100.0 fL Final  . MCH 02/01/2014 29.5  26.0 - 34.0 pg Final  . MCHC  02/01/2014 34.0  30.0 - 36.0 g/dL Final  . RDW 02/01/2014 15.2  11.5 - 15.5 % Final  . Platelets 02/01/2014 283  150 - 400 K/uL Final  . Glucose-Capillary 02/01/2014 122* 70 - 99 mg/dL Final  . Comment 1 02/01/2014 Documented in Chart   Final  . Comment 2 02/01/2014 Notify RN   Final    Urinalysis    Component Value Date/Time   COLORURINE YELLOW 01/25/2014 [place] 01/25/2014 2331   LABSPEC 1.015 01/25/2014 2331   PHURINE 5.5 01/25/2014 2331   GLUCOSEU NEGATIVE 01/25/2014 2331   HGBUR LARGE* 01/25/2014 2331   BILIRUBINUR NEGATIVE 01/25/2014 2331   KETONESUR NEGATIVE 01/25/2014 2331   PROTEINUR 100* 01/25/2014 2331   UROBILINOGEN 0.2 01/25/2014 2331   NITRITE NEGATIVE 01/25/2014 2331   LEUKOCYTESUR NEGATIVE 01/25/2014 2331      _0 : No results found.  PATHOLOGY:  [name], Avin Collected: 01/28/2014 Client: [facility] Accession: [id number] Received: 01/28/2014 John "Ronny Bacon, MD DOB: August 11, 1945 Age: 28 Gender: M Reported: 01/29/2014 [address] Patient Ph: [phone] MRN #: [id number] [place], [place] [address] Visit #: [id number] Chart #: Phone: [phone] Fax: CC: Leisa Lenz, MD REPORT OF SURGICAL PATHOLOGY FINAL DIAGNOSIS Diagnosis Soft tissue, biopsy, right lower pelvis - ADENOCARCINOMA CONSISTENT WITH PROSTATE ADENOCARCINOMA. Microscopic Comment The core biopsies are extensively involved by adenocarcinoma with morphologic features consistent with prostatic adenocarcinoma. The Gleason's score is 4+4=8. (JDP:kh 01-29-14) Claudette Laws MD Pathologist, Electronic Signature (Case signed 01/29/2014) Specimen Gross and Clinical Information Specimen(s) Obtained: Soft tissue, biopsy, right lower pelvis Specimen Clinical Information Presumed metastatic prostate cancer, right pelvic mass to tissue dx purposes [facility]) Gross The specimen is received in formalin and consist of four cores of white pink, focally hemorrhagic soft  tissue ranging from 1.4 cm in length x 0.1 cm in diameter to 2.1 cm in length x 0.1 cm in diameter. The specimen is entirely submitted in one cassette. (KL:ds 01/28/14) Report signed out from the following location(s) Technical Component and Interpretation performed at [email or web address],    IMPRESSION:  #1. 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 #2. History of polysubstance abuse, currently abstinent. #3. Chronic obstructive pulmonary disease.   PLAN:  #1. Discussion was held regarding the use of docetaxel in conjunction with orchiectomy to optimally managed prostate cancer presenting with high-volume disease. The patient was given information about the drug. #2. LifePort insertion to coincide with interventional radiology change of nephrostomy tubes. #3. Nurse navigator intervention to further delineate the potential benefits and risks of using docetaxel. #4. Follow-up on 03/14/2014 to initiate chemotherapy utilizing docetaxel with Neulasta support.  I appreciate the opportunity of sharing in his care.   Doroteo Bradford, MD 02/28/2014 9:35 AM   DISCLAIMER:  This note was dictated with voice recognition softwre.  Similar sounding words can inadvertently be transcribed inaccurately and may not be corrected upon review.

## 2014-03-06 MED ORDER — METOCLOPRAMIDE HCL 5 MG PO TABS: ORAL_TABLET | ORAL | Status: DC

## 2014-03-06 MED ORDER — LIDOCAINE-PRILOCAINE 2.5-2.5 % EX CREA: TOPICAL_CREAM | CUTANEOUS | Status: DC

## 2014-03-06 MED ORDER — DEXAMETHASONE 4 MG PO TABS: ORAL_TABLET | ORAL | Status: DC

## 2014-03-06 MED ORDER — PROCHLORPERAZINE MALEATE 10 MG PO TABS: ORAL_TABLET | ORAL | Status: DC

## 2014-03-06 NOTE — Patient Instructions (Addendum)
[place]   CHEMOTHERAPY INSTRUCTIONS  Taxotere - bone marrow suppression (lowers white blood cells (fight infection), lowers red blood cells (make up your blood), lowers platelets (help blood to clot). This chemo can cause fluid retention. You will be responsible for taking a steroid called Dexamethasone at home prior to and after Taxotere. This steroid will keep you from having fluid retention. Take it whether you think you need it or not. Can cause hair loss, skin/nail changes (darkening of the nail beds, pain where the nail bed meets the skin, loosening of the nail beds, dry skin, palms of hands and soles of feet may darken or get sensitive, nausea/vomiting, paresthesia (numbness or tingling) in extremities - we need to know if this develops, mucositis (inflammation of any mucosal membrane - the mouth, throat), mouth sores, neurotoxicity (loss of memory, headaches, trouble sleeping, etc.), can also cause excessive tear production. Please let us know if any side effect develops.  Neulasta - this medication is not chemo but being given because you have had chemo. It is usually given 24 hours after the completion of chemotherapy. This medication works by boosting your bone marrow's supply of white blood cells. White blood cells are what protect our bodies against infection. The medication is given in the form of a subcutaneous injection. It is given in the fatty tissue of your abdomen. It is a short needle. The major side effect of this medication is bone or muscle pain. The drug of choice to relieve or lessen the pain is Aleve or Ibuprofen. If a physician has ever told you not to take Aleve or Ibuprofen - then don't take it. You should then take Tylenol/acetaminophen. Take either medication as the bottle directs you to.  The level of pain you experience as a result of this injection can range from none, to mild or moderate, or severe. Please let us know if you develop  moderate or severe bone pain.     POTENTIAL SIDE EFFECTS OF TREATMENT: Increased Susceptibility to Infection, Vomiting, Constipation, Hair Thinning, Changes in Character of Skin and Nails (brittleness, dryness,etc.), Pigment Changes (darkening of nail beds, palms of hands, soles of feet, etc.), Bone Marrow Suppression, Abdominal Cramping, Nausea, Diarrhea, Sun Sensitivity and Mouth Sores   SELF IMAGE NEEDS AND REFERRALS MADE: Obtain hair accessories as soon as possible (hats)   EDUCATIONAL MATERIALS GIVEN AND REVIEWED: Chemotherapy and You booklet Specific Instruction Sheets: Taxotere, Neulasta, Dexamethasone, Zofran, Reglan, Compazine, EMLA cream, port-a-cath, Prostate Cancer    SELF CARE ACTIVITIES WHILE ON CHEMOTHERAPY: Increase your fluid intake 48 hours prior to treatment and drink at least 2 quarts per day after treatment., No alcohol intake., No aspirin or other medications unless approved by your oncologist., Eat foods that are light and easy to digest., Eat foods at cold or room temperature., No fried, fatty, or spicy foods immediately before or after treatment., Have teeth cleaned professionally before starting treatment. Keep dentures and partial plates clean., Use soft toothbrush and do not use mouthwashes that contain alcohol. Biotene is a good mouthwash that is available at most pharmacies or may be ordered by calling [phone]., Use warm salt water gargles (1 teaspoon salt per 1 quart warm water) before and after meals and at bedtime. Or you may rinse with 2 tablespoons of three -percent hydrogen peroxide mixed in eight ounces of water., Always use sunscreen with SPF (Sun Protection Factor) of 30 or higher., Use your nausea medication as directed to prevent nausea., Use  your stool softener or laxative as directed to prevent constipation. and Use your anti-diarrheal medication as directed to stop diarrhea.  Please wash your hands for at least 30 seconds using warm soapy water.  Handwashing is the #1 way to prevent the spread of germs. Stay away from sick people or people who are getting over a cold. If you develop respiratory systems such as green/yellow mucus production or productive cough or persistent cough let us know and we will see if you need an antibiotic. It is a good idea to keep a pair of gloves on when going into grocery stores/[employer] to decrease your risk of coming into contact with germs on the carts, etc. Carry alcohol hand gel with you at all times and use it frequently if out in public. All foods need to be cooked thoroughly. No raw foods. No medium or undercooked meats, eggs. If your food is cooked medium well, it does not need to be hot pink or saturated with bloody liquid at all. Vegetables and fruits need to be washed/rinsed under the faucet with a dish detergent before being consumed. You can eat raw fruits and vegetables unless we tell you otherwise but it would be best if you cooked them or bought frozen. Do not eat off of salad bars or hot bars unless you really trust the cleanliness of the restaurant. If you need dental work, please let us know before you go for your appointment so that we can coordinate the best possible time for you in regards to your chemo regimen. You need to also let your dentist know that you are actively taking chemo. We may need to do labs prior to your dental appointment. We also want your bowels moving at least every other day. If this is not happening, we need to know so that we can get you on a bowel regimen to help you go.     MEDICATIONS: You have been given prescriptions for the following medications:  Dexamethasone 4mg  tablet. The day before, day of, and day after chemo take 2 tablets in the am and 2 tablets in the pm. (Take with food)  Metoclopramide/Reglan 5mg  tablet. Starting the day after chemo, take 1 tablet four times a day x 2 days. Then may take 1 tablet four times a day if needed for nausea/vomiting.    Proclorperazine/Compazine 10mg  tablet. Starting the day after chemo, take 1 tablet four times a day x 2 days. Then may take 1 tablet four times a day if needed for nausea/vomiting.  EMLA cream. Apply a quarter size amount to port site 1 hour prior to chemo. Do not rub in. Cover with plastic wrap.    Over-the-Counter Meds:  Colace - this is a stool softener. Take 100mg  capsule 2-6 times a day as needed. If you have to take more than 6 capsules of Colace a day call the [facility].  Senna - this is a mild laxative used to treat mild constipation. May take 2 tabs by mouth daily or up to twice a day as needed for mild constipation.  Milk of Magnesia - this is a laxative used to treat moderate to severe constipation. May take 2-4 tablespoons every 8 hours as needed. May increase to 8 tablespoons x 1 dose and if no bowel movement call the [facility].  Imodium - this is for diarrhea. Take 2 tabs after 1st loose stool and then 1 tab every 2 hours until you go a total of 12 hours without a loose  stool. Call [facility] if loose stools continue.   SYMPTOMS TO REPORT AS SOON AS POSSIBLE AFTER TREATMENT:  FEVER GREATER THAN 100.5 F  CHILLS WITH OR WITHOUT FEVER  NAUSEA AND VOMITING THAT IS NOT CONTROLLED WITH YOUR NAUSEA MEDICATION  UNUSUAL SHORTNESS OF BREATH  UNUSUAL BRUISING OR BLEEDING  TENDERNESS IN MOUTH AND THROAT WITH OR WITHOUT PRESENCE OF ULCERS  URINARY PROBLEMS  BOWEL PROBLEMS  UNUSUAL RASH    Wear comfortable clothing and clothing appropriate for easy access to any Portacath or PICC line. Let us know if there is anything that we can do to make your therapy better!      I have been informed and understand all of the instructions given to me and have received a copy. I have been instructed to call the clinic [phone] or my family physician as soon as possible for continued medical care, if indicated. I do not have any more questions at this time but  understand that I may call the [facility] or the Patient Navigator at [phone] during office hours should I have questions or need assistance in obtaining follow-up care.        Docetaxel injection What is this medicine? DOCETAXEL (doe se TAX el) is a chemotherapy drug. It targets fast dividing cells, like cancer cells, and causes these cells to die. This medicine is used to treat many types of cancers like breast cancer, certain stomach cancers, head and neck cancer, lung cancer, and prostate cancer. This medicine may be used for other purposes; ask your health care provider or pharmacist if you have questions. COMMON BRAND NAME(S): Docefrez, Taxotere What should I tell my health care provider before I take this medicine? They need to know if you have any of these conditions: -infection (especially a virus infection such as chickenpox, cold sores, or herpes) -liver disease -low blood counts, like low white cell, platelet, or red cell counts -an unusual or allergic reaction to docetaxel, polysorbate 80, other chemotherapy agents, other medicines, foods, dyes, or preservatives -pregnant or trying to get pregnant -breast-feeding How should I use this medicine? This drug is given as an infusion into a vein. It is administered in a hospital or clinic by a specially trained health care professional. Talk to your pediatrician regarding the use of this medicine in children. Special care may be needed. Overdosage: If you think you have taken too much of this medicine contact a poison control center or emergency room at once. NOTE: This medicine is only for you. Do not share this medicine with others. What if I miss a dose? It is important not to miss your dose. Call your doctor or health care professional if you are unable to keep an appointment. What may interact with this medicine? -cyclosporine -erythromycin -ketoconazole -medicines to increase blood counts like filgrastim,  pegfilgrastim, sargramostim -vaccines Talk to your doctor or health care professional before taking any of these medicines: -acetaminophen -aspirin -ibuprofen -ketoprofen -naproxen This list may not describe all possible interactions. Give your health care provider a list of all the medicines, herbs, non-prescription drugs, or dietary supplements you use. Also tell them if you smoke, drink alcohol, or use illegal drugs. Some items may interact with your medicine. What should I watch for while using this medicine? Your condition will be monitored carefully while you are receiving this medicine. You will need important blood work done while you are taking this medicine. This drug may make you feel generally unwell. This is not uncommon,  as chemotherapy can affect healthy cells as well as cancer cells. Report any side effects. Continue your course of treatment even though you feel ill unless your doctor tells you to stop. In some cases, you may be given additional medicines to help with side effects. Follow all directions for their use. Call your doctor or health care professional for advice if you get a fever, chills or sore throat, or other symptoms of a cold or flu. Do not treat yourself. This drug decreases your body's ability to fight infections. Try to avoid being around people who are sick. This medicine may increase your risk to bruise or bleed. Call your doctor or health care professional if you notice any unusual bleeding. Be careful brushing and flossing your teeth or using a toothpick because you may get an infection or bleed more easily. If you have any dental work done, tell your dentist you are receiving this medicine. Avoid taking products that contain aspirin, acetaminophen, ibuprofen, naproxen, or ketoprofen unless instructed by your doctor. These medicines may hide a fever. This medicine contains an alcohol in the product. You may get drowsy or dizzy. Do not drive, use machinery, or  do anything that needs mental alertness until you know how this medicine affects you. Do not stand or sit up quickly, especially if you are an older patient. This reduces the risk of dizzy or fainting spells. Avoid alcoholic drinks Do not become pregnant while taking this medicine. Women should inform their doctor if they wish to become pregnant or think they might be pregnant. There is a potential for serious side effects to an unborn child. Talk to your health care professional or pharmacist for more information. Do not breast-feed an infant while taking this medicine. What side effects may I notice from receiving this medicine? Side effects that you should report to your doctor or health care professional as soon as possible: -allergic reactions like skin rash, itching or hives, swelling of the face, lips, or tongue -low blood counts - This drug may decrease the number of white blood cells, red blood cells and platelets. You may be at increased risk for infections and bleeding. -signs of infection - fever or chills, cough, sore throat, pain or difficulty passing urine -signs of decreased platelets or bleeding - bruising, pinpoint red spots on the skin, black, tarry stools, nosebleeds -signs of decreased red blood cells - unusually weak or tired, fainting spells, lightheadedness -breathing problems -fast or irregular heartbeat -low blood pressure -mouth sores -nausea and vomiting -pain, swelling, redness or irritation at the injection site -pain, tingling, numbness in the hands or feet -swelling of the ankle, feet, hands -weight gain Side effects that usually do not require medical attention (report to your prescriber or health care professional if they continue or are bothersome): -bone pain -complete hair loss including hair on your head, underarms, pubic hair, eyebrows, and eyelashes -diarrhea -excessive tearing -changes in the color of fingernails -loosening of the  fingernails -nausea -muscle pain -red flush to skin -sweating -weak or tired This list may not describe all possible side effects. Call your doctor for medical advice about side effects. You may report side effects to FDA at 1-800-FDA-1088. Where should I keep my medicine? This drug is given in a hospital or clinic and will not be stored at home. NOTE: This sheet is a summary. It may not cover all possible information. If you have questions about this medicine, talk to your doctor, pharmacist, or health care provider.  2015,  Elsevier/Gold Standard. (2013-03-08 22:21:02) Pegfilgrastim injection What is this medicine? PEGFILGRASTIM (peg fil GRA stim) is a long-acting granulocyte colony-stimulating factor that stimulates the growth of neutrophils, a type of white blood cell important in the body's fight against infection. It is used to reduce the incidence of fever and infection in patients with certain types of cancer who are receiving chemotherapy that affects the bone marrow. This medicine may be used for other purposes; ask your health care provider or pharmacist if you have questions. COMMON BRAND NAME(S): Neulasta What should I tell my health care provider before I take this medicine? They need to know if you have any of these conditions: -latex allergy -ongoing radiation therapy -sickle cell disease -skin reactions to acrylic adhesives (On-Body Injector only) -an unusual or allergic reaction to pegfilgrastim, filgrastim, other medicines, foods, dyes, or preservatives -pregnant or trying to get pregnant -breast-feeding How should I use this medicine? This medicine is for injection under the skin. If you get this medicine at home, you will be taught how to prepare and give the pre-filled syringe or how to use the On-body Injector. Refer to the patient Instructions for Use for detailed instructions. Use exactly as directed. Take your medicine at regular intervals. Do not take your  medicine more often than directed. It is important that you put your used needles and syringes in a special sharps container. Do not put them in a trash can. If you do not have a sharps container, call your pharmacist or healthcare provider to get one. Talk to your pediatrician regarding the use of this medicine in children. Special care may be needed. Overdosage: If you think you have taken too much of this medicine contact a poison control center or emergency room at once. NOTE: This medicine is only for you. Do not share this medicine with others. What if I miss a dose? It is important not to miss your dose. Call your doctor or health care professional if you miss your dose. If you miss a dose due to an On-body Injector failure or leakage, a new dose should be administered as soon as possible using a single prefilled syringe for manual use. What may interact with this medicine? Interactions have not been studied. Give your health care provider a list of all the medicines, herbs, non-prescription drugs, or dietary supplements you use. Also tell them if you smoke, drink alcohol, or use illegal drugs. Some items may interact with your medicine. This list may not describe all possible interactions. Give your health care provider a list of all the medicines, herbs, non-prescription drugs, or dietary supplements you use. Also tell them if you smoke, drink alcohol, or use illegal drugs. Some items may interact with your medicine. What should I watch for while using this medicine? You may need blood work done while you are taking this medicine. If you are going to need a MRI, CT scan, or other procedure, tell your doctor that you are using this medicine (On-Body Injector only). What side effects may I notice from receiving this medicine? Side effects that you should report to your doctor or health care professional as soon as possible: -allergic reactions like skin rash, itching or hives, swelling of the  face, lips, or tongue -dizziness -fever -pain, redness, or irritation at site where injected -pinpoint red spots on the skin -shortness of breath or breathing problems -stomach or side pain, or pain at the shoulder -swelling -tiredness -trouble passing urine Side effects that usually do not require medical  attention (report to your doctor or health care professional if they continue or are bothersome): -bone pain -muscle pain This list may not describe all possible side effects. Call your doctor for medical advice about side effects. You may report side effects to FDA at 1-800-FDA-1088. Where should I keep my medicine? Keep out of the reach of children. Store pre-filled syringes in a refrigerator between 2 and 8 degrees C (36 and 46 degrees F). Do not freeze. Keep in carton to protect from light. Throw away this medicine if it is left out of the refrigerator for more than 48 hours. Throw away any unused medicine after the expiration date. NOTE: This sheet is a summary. It may not cover all possible information. If you have questions about this medicine, talk to your doctor, pharmacist, or health care provider.  2015, Elsevier/Gold Standard. (2013-07-12 16:14:05) Ondansetron injection What is this medicine? ONDANSETRON (on DAN se tron) is used to treat nausea and vomiting caused by chemotherapy. It is also used to prevent or treat nausea and vomiting after surgery. This medicine may be used for other purposes; ask your health care provider or pharmacist if you have questions. COMMON BRAND NAME(S): Zofran What should I tell my health care provider before I take this medicine? They need to know if you have any of these conditions: -heart disease -history of irregular heartbeat -liver disease -low levels of magnesium or potassium in the blood -an unusual or allergic reaction to ondansetron, granisetron, other medicines, foods, dyes, or preservatives -pregnant or trying to get  pregnant -breast-feeding How should I use this medicine? This medicine is for infusion into a vein. It is given by a health care professional in a hospital or clinic setting. Talk to your pediatrician regarding the use of this medicine in children. Special care may be needed. Overdosage: If you think you have taken too much of this medicine contact a poison control center or emergency room at once. NOTE: This medicine is only for you. Do not share this medicine with others. What if I miss a dose? This does not apply. What may interact with this medicine? Do not take this medicine with any of the following medications: -apomorphine -certain medicines for fungal infections like fluconazole, itraconazole, ketoconazole, posaconazole, voriconazole -cisapride -dofetilide -dronedarone -pimozide -thioridazine -ziprasidone This medicine may also interact with the following medications: -carbamazepine -certain medicines for depression, anxiety, or psychotic disturbances -fentanyl -linezolid -MAOIs like Carbex, Eldepryl, Marplan, Nardil, and Parnate -methylene blue (injected into a vein) -other medicines that prolong the QT interval (cause an abnormal heart rhythm) -phenytoin -rifampicin -tramadol This list may not describe all possible interactions. Give your health care provider a list of all the medicines, herbs, non-prescription drugs, or dietary supplements you use. Also tell them if you smoke, drink alcohol, or use illegal drugs. Some items may interact with your medicine. What should I watch for while using this medicine? Your condition will be monitored carefully while you are receiving this medicine. What side effects may I notice from receiving this medicine? Side effects that you should report to your doctor or health care professional as soon as possible: -allergic reactions like skin rash, itching or hives, swelling of the face, lips, or tongue -breathing  problems -confusion -dizziness -fast or irregular heartbeat -feeling faint or lightheaded, falls -fever and chills -loss of balance or coordination -seizures -sweating -swelling of the hands and feet -tightness in the chest -tremors -unusually weak or tired Side effects that usually do not require medical attention (report  to your doctor or health care professional if they continue or are bothersome): -constipation or diarrhea -headache This list may not describe all possible side effects. Call your doctor for medical advice about side effects. You may report side effects to FDA at 1-800-FDA-1088. Where should I keep my medicine? This drug is given in a hospital or clinic and will not be stored at home. NOTE: This sheet is a summary. It may not cover all possible information. If you have questions about this medicine, talk to your doctor, pharmacist, or health care provider.  2015, Elsevier/Gold Standard. (2013-01-17 16:18:28) Dexamethasone injection What is this medicine? DEXAMETHASONE (dex a METH a sone) is a corticosteroid. It is used to treat inflammation of the skin, joints, lungs, and other organs. Common conditions treated include asthma, allergies, and arthritis. It is also used for other conditions, like blood disorders and diseases of the adrenal glands. This medicine may be used for other purposes; ask your health care provider or pharmacist if you have questions. COMMON BRAND NAME(S): Decadron, Solurex What should I tell my health care provider before I take this medicine? They need to know if you have any of these conditions: -blood clotting problems -Cushing's syndrome -diabetes -glaucoma -heart problems or disease -high blood pressure -infection like herpes, measles, tuberculosis, or chickenpox -kidney disease -liver disease -mental problems -myasthenia gravis -osteoporosis -previous heart attack -seizures -stomach, ulcer or intestine disease including  colitis and diverticulitis -thyroid problem -an unusual or allergic reaction to dexamethasone, corticosteroids, other medicines, lactose, foods, dyes, or preservatives -pregnant or trying to get pregnant -breast-feeding How should I use this medicine? This medicine is for injection into a muscle, joint, lesion, soft tissue, or vein. It is given by a health care professional in a hospital or clinic setting. Talk to your pediatrician regarding the use of this medicine in children. Special care may be needed. Overdosage: If you think you have taken too much of this medicine contact a poison control center or emergency room at once. NOTE: This medicine is only for you. Do not share this medicine with others. What if I miss a dose? This may not apply. If you are having a series of injections over a prolonged period, try not to miss an appointment. Call your doctor or health care professional to reschedule if you are unable to keep an appointment. What may interact with this medicine? Do not take this medicine with any of the following medications: -mifepristone, RU-486 -vaccines This medicine may also interact with the following medications: -amphotericin B -antibiotics like clarithromycin, erythromycin, and troleandomycin -aspirin and aspirin-like drugs -barbiturates like phenobarbital -carbamazepine -cholestyramine -cholinesterase inhibitors like donepezil, galantamine, rivastigmine, and tacrine -cyclosporine -digoxin -diuretics -ephedrine -male hormones, like estrogens or progestins and birth control pills -indinavir -isoniazid -ketoconazole -medicines for diabetes -medicines that improve muscle tone or strength for conditions like myasthenia gravis -NSAIDs, medicines for pain and inflammation, like ibuprofen or naproxen -phenytoin -rifampin -thalidomide -warfarin This list may not describe all possible interactions. Give your health care provider a list of all the medicines,  herbs, non-prescription drugs, or dietary supplements you use. Also tell them if you smoke, drink alcohol, or use illegal drugs. Some items may interact with your medicine. What should I watch for while using this medicine? Your condition will be monitored carefully while you are receiving this medicine. If you are taking this medicine for a long time, carry an identification card with your name and address, the type and dose of your medicine, and your doctor's name  and address. This medicine may increase your risk of getting an infection. Stay away from people who are sick. Tell your doctor or health care professional if you are around anyone with measles or chickenpox. Talk to your health care provider before you get any vaccines that you take this medicine. If you are going to have surgery, tell your doctor or health care professional that you have taken this medicine within the last twelve months. Ask your doctor or health care professional about your diet. You may need to lower the amount of salt you eat. The medicine can increase your blood sugar. If you are a diabetic check with your doctor if you need help adjusting the dose of your diabetic medicine. What side effects may I notice from receiving this medicine? Side effects that you should report to your doctor or health care professional as soon as possible: -allergic reactions like skin rash, itching or hives, swelling of the face, lips, or tongue -black or tarry stools -change in the amount of urine -changes in vision -confusion, excitement, restlessness, a false sense of well-being -fever, sore throat, sneezing, cough, or other signs of infection, wounds that will not heal -hallucinations -increased thirst -mental depression, mood swings, mistaken feelings of self importance or of being mistreated -pain in hips, back, ribs, arms, shoulders, or legs -pain, redness, or irritation at the injection site -redness, blistering, peeling or  loosening of the skin, including inside the mouth -rounding out of face -swelling of feet or lower legs -unusual bleeding or bruising -unusual tired or weak -wounds that do not heal Side effects that usually do not require medical attention (report to your doctor or health care professional if they continue or are bothersome): -diarrhea or constipation -change in taste -headache -nausea, vomiting -skin problems, acne, thin and shiny skin -touble sleeping -unusual growth of hair on the face or body -weight gain This list may not describe all possible side effects. Call your doctor for medical advice about side effects. You may report side effects to FDA at 1-800-FDA-1088. Where should I keep my medicine? This drug is given in a hospital or clinic and will not be stored at home. NOTE: This sheet is a summary. It may not cover all possible information. If you have questions about this medicine, talk to your doctor, pharmacist, or health care provider.  2015, Else[employer] Standard. (2007-08-03 14:04:12) Metoclopramide tablets What is this medicine? METOCLOPRAMIDE (met oh kloe PRA mide) is used to treat the symptoms of gastroesophageal reflux disease (GERD) like heartburn. It is also used to treat people with slow emptying of the stomach and intestinal tract. This medicine may be used for other purposes; ask your health care provider or pharmacist if you have questions. COMMON BRAND NAME(S): Reglan What should I tell my health care provider before I take this medicine? They need to know if you have any of these conditions: -breast cancer -depression -diabetes -heart failure -high blood pressure -kidney disease -liver disease -Parkinson's disease or a movement disorder -pheochromocytoma -seizures -stomach obstruction, bleeding, or perforation -an unusual or allergic reaction to metoclopramide, procainamide, sulfites, other medicines, foods, dyes, or preservatives -pregnant or  trying to get pregnant -breast-feeding How should I use this medicine? Take this medicine by mouth with a glass of water. Follow the directions on the prescription label. Take this medicine on an empty stomach, about 30 minutes before eating. Take your doses at regular intervals. Do not take your medicine more often than directed. Do not stop taking except on  the advice of your doctor or health care professional. A special MedGuide will be given to you by the pharmacist with each prescription and refill. Be sure to read this information carefully each time. Talk to your pediatrician regarding the use of this medicine in children. Special care may be needed. Overdosage: If you think you have taken too much of this medicine contact a poison control center or emergency room at once. NOTE: This medicine is only for you. Do not share this medicine with others. What if I miss a dose? If you miss a dose, take it as soon as you can. If it is almost time for your next dose, take only that dose. Do not take double or extra doses. What may interact with this medicine? -acetaminophen -cyclosporine -digoxin -medicines for blood pressure -medicines for diabetes, including insulin -medicines for hay fever and other allergies -medicines for depression, especially an Monoamine Oxidase Inhibitor (MAOI) -medicines for Parkinson's disease, like levodopa -medicines for sleep or for pain -tetracycline This list may not describe all possible interactions. Give your health care provider a list of all the medicines, herbs, non-prescription drugs, or dietary supplements you use. Also tell them if you smoke, drink alcohol, or use illegal drugs. Some items may interact with your medicine. What should I watch for while using this medicine? It may take a few weeks for your stomach condition to start to get better. However, do not take this medicine for longer than 12 weeks. The longer you take this medicine, and the more  you take it, the greater your chances are of developing serious side effects. If you are an elderly patient, a male patient, or you have diabetes, you may be at an increased risk for side effects from this medicine. Contact your doctor immediately if you start having movements you cannot control such as lip smacking, rapid movements of the tongue, involuntary or uncontrollable movements of the eyes, head, arms and legs, or muscle twitches and spasms. Patients and their families should watch out for worsening depression or thoughts of suicide. Also watch out for any sudden or severe changes in feelings such as feeling anxious, agitated, panicky, irritable, hostile, aggressive, impulsive, severely restless, overly excited and hyperactive, or not being able to sleep. If this happens, especially at the beginning of treatment or after a change in dose, call your doctor. Do not treat yourself for high fever. Ask your doctor or health care professional for advice. You may get drowsy or dizzy. Do not drive, use machinery, or do anything that needs mental alertness until you know how this drug affects you. Do not stand or sit up quickly, especially if you are an older patient. This reduces the risk of dizzy or fainting spells. Alcohol can make you more drowsy and dizzy. Avoid alcoholic drinks. What side effects may I notice from receiving this medicine? Side effects that you should report to your doctor or health care professional as soon as possible: -allergic reactions like skin rash, itching or hives, swelling of the face, lips, or tongue -abnormal production of milk in females -breast enlargement in both males and females -change in the way you walk -difficulty moving, speaking or swallowing -drooling, lip smacking, or rapid movements of the tongue -excessive sweating -fever -involuntary or uncontrollable movements of the eyes, head, arms and legs -irregular heartbeat or palpitations -muscle twitches  and spasms -unusually weak or tired Side effects that usually do not require medical attention (report to your doctor or health care professional if  they continue or are bothersome): -change in sex drive or performance -depressed mood -diarrhea -difficulty sleeping -headache -menstrual changes -restless or nervous This list may not describe all possible side effects. Call your doctor for medical advice about side effects. You may report side effects to FDA at 1-800-FDA-1088. Where should I keep my medicine? Keep out of the reach of children. Store at room temperature between 20 and 25 degrees C (68 and 77 degrees F). Protect from light. Keep container tightly closed. Throw away any unused medicine after the expiration date. NOTE: This sheet is a summary. It may not cover all possible information. If you have questions about this medicine, talk to your doctor, pharmacist, or health care provider.  2015, [employer] Standard. (2011-08-10 13:04:38) Prochlorperazine tablets What is this medicine? PROCHLORPERAZINE (proe klor PER a zeen) helps to control severe nausea and vomiting. This medicine is also used to treat schizophrenia. It can also help patients who experience anxiety that is not due to psychological illness. This medicine may be used for other purposes; ask your health care provider or pharmacist if you have questions. COMMON BRAND NAME(S): Compazine What should I tell my health care provider before I take this medicine? They need to know if you have any of these conditions: -blood disorders or disease -dementia -liver disease or jaundice -Parkinson's disease -uncontrollable movement disorder -an unusual or allergic reaction to prochlorperazine, other medicines, foods, dyes, or preservatives -pregnant or trying to get pregnant -breast-feeding How should I use this medicine? Take this medicine by mouth with a glass of water. Follow the directions on the prescription label.  Take your doses at regular intervals. Do not take your medicine more often than directed. Do not stop taking this medicine suddenly. This can cause nausea, vomiting, and dizziness. Ask your doctor or health care professional for advice. Talk to your pediatrician regarding the use of this medicine in children. Special care may be needed. While this drug may be prescribed for children as young as 2 years for selected conditions, precautions do apply. Overdosage: If you think you have taken too much of this medicine contact a poison control center or emergency room at once. NOTE: This medicine is only for you. Do not share this medicine with others. What if I miss a dose? If you miss a dose, take it as soon as you can. If it is almost time for your next dose, take only that dose. Do not take double or extra doses. What may interact with this medicine? Do not take this medicine with any of the following medications: -amoxapine -antidepressants like citalopram, escitalopram, fluoxetine, paroxetine, and sertraline -deferoxamine -dofetilide -maprotiline -tricyclic antidepressants like amitriptyline, clomipramine, imipramine, nortiptyline and others This medicine may also interact with the following medications: -lithium -medicines for pain -phenytoin -propranolol -warfarin This list may not describe all possible interactions. Give your health care provider a list of all the medicines, herbs, non-prescription drugs, or dietary supplements you use. Also tell them if you smoke, drink alcohol, or use illegal drugs. Some items may interact with your medicine. What should I watch for while using this medicine? Visit your doctor or health care professional for regular checks on your progress. You may get drowsy or dizzy. Do not drive, use machinery, or do anything that needs mental alertness until you know how this medicine affects you. Do not stand or sit up quickly, especially if you are an older patient.  This reduces the risk of dizzy or fainting spells. Alcohol may interfere with the  effect of this medicine. Avoid alcoholic drinks. This medicine can reduce the response of your body to heat or cold. Dress warm in cold weather and stay hydrated in hot weather. If possible, avoid extreme temperatures like saunas, hot tubs, very hot or cold showers, or activities that can cause dehydration such as vigorous exercise. This medicine can make you more sensitive to the sun. Keep out of the sun. If you cannot avoid being in the sun, wear protective clothing and use sunscreen. Do not use sun lamps or tanning beds/booths. Your mouth may get dry. Chewing sugarless gum or sucking hard candy, and drinking plenty of water may help. Contact your doctor if the problem does not go away or is severe. What side effects may I notice from receiving this medicine? Side effects that you should report to your doctor or health care professional as soon as possible: -blurred vision -breast enlargement in men or women -breast milk in women who are not breast-feeding -chest pain, fast or irregular heartbeat -confusion, restlessness -dark yellow or brown urine -difficulty breathing or swallowing -dizziness or fainting spells -drooling, shaking, movement difficulty (shuffling walk) or rigidity -fever, chills, sore throat -involuntary or uncontrollable movements of the eyes, mouth, head, arms, and legs -seizures -stomach area pain -unusually weak or tired -unusual bleeding or bruising -yellowing of skin or eyes Side effects that usually do not require medical attention (report to your doctor or health care professional if they continue or are bothersome): -difficulty passing urine -difficulty sleeping -headache -sexual dysfunction -skin rash, or itching This list may not describe all possible side effects. Call your doctor for medical advice about side effects. You may report side effects to FDA at  1-800-FDA-1088. Where should I keep my medicine? Keep out of the reach of children. Store at room temperature between 15 and 30 degrees C (59 and 86 degrees F). Protect from light. Throw away any unused medicine after the expiration date. NOTE: This sheet is a summary. It may not cover all possible information. If you have questions about this medicine, talk to your doctor, pharmacist, or health care provider.  2015, [employer] Standard. (2011-08-31 16:59:39) Lidocaine; Prilocaine cream What is this medicine? LIDOCAINE; PRILOCAINE (LYE doe kane; PRIL oh kane) is a topical anesthetic that causes loss of feeling in the skin and surrounding tissues. It is used to numb the skin before procedures or injections. This medicine may be used for other purposes; ask your health care provider or pharmacist if you have questions. COMMON BRAND NAME(S): EMLA What should I tell my health care provider before I take this medicine? They need to know if you have any of these conditions: -glucose-6-phosphate deficiencies -heart disease -kidney or liver disease -methemoglobinemia -an unusual or allergic reaction to lidocaine, prilocaine, other medicines, foods, dyes, or preservatives -pregnant or trying to get pregnant -breast-feeding How should I use this medicine? This medicine is for external use only on the skin. Do not take by mouth. Follow the directions on the prescription label. Wash hands before and after use. Do not use more or leave in contact with the skin longer than directed. Do not apply to eyes or open wounds. It can cause irritation and blurred or temporary loss of vision. If this medicine comes in contact with your eyes, immediately rinse the eye with water. Do not touch or rub the eye. Contact your health care provider right away. Talk to your pediatrician regarding the use of this medicine in children. While this medicine may be prescribed for  children for selected conditions, precautions do  apply. Overdosage: If you think you have taken too much of this medicine contact a poison control center or emergency room at once. NOTE: This medicine is only for you. Do not share this medicine with others. What if I miss a dose? This medicine is usually only applied once prior to each procedure. It must be in contact with the skin for a period of time for it to work. If you applied this medicine later than directed, tell your health care professional before starting the procedure. What may interact with this medicine? -acetaminophen -chloroquine -dapsone -medicines to control heart rhythm -nitrates like nitroglycerin and nitroprusside -other ointments, creams, or sprays that may contain anesthetic medicine -phenobarbital -phenytoin -quinine -sulfonamides like sulfacetamide, sulfamethoxazole, sulfasalazine and others This list may not describe all possible interactions. Give your health care provider a list of all the medicines, herbs, non-prescription drugs, or dietary supplements you use. Also tell them if you smoke, drink alcohol, or use illegal drugs. Some items may interact with your medicine. What should I watch for while using this medicine? Be careful to avoid injury to the treated area while it is numb and you are not aware of pain. Avoid scratching, rubbing, or exposing the treated area to hot or cold temperatures until complete sensation has returned. The numb feeling will wear off a few hours after applying the cream. What side effects may I notice from receiving this medicine? Side effects that you should report to your doctor or health care professional as soon as possible: -blurred vision -chest pain -difficulty breathing -dizziness -drowsiness -fast or irregular heartbeat -skin rash or itching -swelling of your throat, lips, or face -trembling Side effects that usually do not require medical attention (report to your doctor or health care professional if they continue  or are bothersome): -changes in ability to feel hot or cold -redness and swelling at the application site This list may not describe all possible side effects. Call your doctor for medical advice about side effects. You may report side effects to FDA at 1-800-FDA-1088. Where should I keep my medicine? Keep out of reach of children. Store at room temperature between 15 and 30 degrees C (59 and 86 degrees F). Keep container tightly closed. Throw away any unused medicine after the expiration date. NOTE: This sheet is a summary. It may not cover all possible information. If you have questions about this medicine, talk to your doctor, pharmacist, or health care provider.  2015, [employer] Standard. (2007-10-16 17:14:35) Prostate Cancer The prostate is a male gland that helps produce semen. Prostate cancer is the abnormal growth of cells in this gland. HOME CARE  Only take medicines as told by your doctor.  Eat a healthy diet.  Get plenty of sleep.  Consider joining a support group.  Seek advice to help you manage treatment side effects.  Keep all follow-up visits as told by your doctor.  Tell your cancer specialist if you are admitted to the hospital.  Touch, hold, hug, and caress your partner to continue to show sexual feelings. GET HELP IF:  You have trouble peeing (urinating).  You have blood in your pee.  You have trouble having an erection.  You have pain in your hips, back, or chest. GET HELP RIGHT AWAY IF:  You have weakness or loss of feeling (numbness) in your legs.  You have accidental loss of pee or poop (stool). Document Released: 03/31/2009 Document Revised: 08/27/2013 Document Reviewed: 09/29/2012 [employer] Patient Information  2015 [employer]. This information is not intended to replace advice given to you by your health care provider. Make sure you discuss any questions you have with your health care provider.  

## 2014-03-07 ENCOUNTER — Encounter (HOSPITAL_BASED_OUTPATIENT_CLINIC_OR_DEPARTMENT_OTHER): Payer: PRIVATE HEALTH INSURANCE

## 2014-03-07 DIAGNOSIS — C7951 Secondary malignant neoplasm of bone: Secondary | ICD-10-CM

## 2014-03-07 DIAGNOSIS — C61 Malignant neoplasm of prostate: Secondary | ICD-10-CM

## 2014-03-07 MED ORDER — LOPERAMIDE HCL 2 MG PO TABS: ORAL_TABLET | ORAL | Status: DC

## 2014-03-07 MED ORDER — METOCLOPRAMIDE HCL 5 MG PO TABS: ORAL_TABLET | ORAL | Status: DC

## 2014-03-07 MED ORDER — LIDOCAINE-PRILOCAINE 2.5-2.5 % EX CREA: TOPICAL_CREAM | CUTANEOUS | Status: DC

## 2014-03-07 MED ORDER — PROCHLORPERAZINE MALEATE 10 MG PO TABS: ORAL_TABLET | ORAL | Status: DC

## 2014-03-07 MED ORDER — DEXAMETHASONE 4 MG PO TABS: ORAL_TABLET | ORAL | Status: DC

## 2014-03-07 NOTE — Progress Notes (Signed)
Chemo teaching done and consent signed for [facility]. Chemo and med calendar given to patient & staff member of [facility] that [facility]. Meds escribed to [facility] in [place].

## 2014-03-11 ENCOUNTER — Other Ambulatory Visit: Payer: Self-pay | Admitting: Radiology

## 2014-03-12 ENCOUNTER — Other Ambulatory Visit (HOSPITAL_COMMUNITY): Payer: Self-pay | Admitting: Hematology and Oncology

## 2014-03-12 ENCOUNTER — Other Ambulatory Visit: Payer: Self-pay | Admitting: Urology

## 2014-03-12 ENCOUNTER — Ambulatory Visit (HOSPITAL_COMMUNITY)
Admission: RE | Admit: 2014-03-12 | Discharge: 2014-03-12 | Disposition: A | Discharge status: Home / Self Care | Payer: PRIVATE HEALTH INSURANCE | Source: Ambulatory Visit | Attending: Hematology and Oncology | Admitting: Hematology and Oncology

## 2014-03-12 ENCOUNTER — Encounter (HOSPITAL_COMMUNITY): Payer: Self-pay

## 2014-03-12 ENCOUNTER — Ambulatory Visit (HOSPITAL_COMMUNITY)
Admission: RE | Admit: 2014-03-12 | Discharge: 2014-03-12 | Disposition: A | Discharge status: Home / Self Care | Payer: PRIVATE HEALTH INSURANCE | Source: Ambulatory Visit | Attending: Urology | Admitting: Urology

## 2014-03-12 DIAGNOSIS — C61 Malignant neoplasm of prostate: Secondary | ICD-10-CM

## 2014-03-12 DIAGNOSIS — F1010 Alcohol abuse, uncomplicated: Secondary | ICD-10-CM | POA: Diagnosis not present

## 2014-03-12 DIAGNOSIS — R590 Localized enlarged lymph nodes: Secondary | ICD-10-CM

## 2014-03-12 DIAGNOSIS — N179 Acute kidney failure, unspecified: Secondary | ICD-10-CM | POA: Diagnosis not present

## 2014-03-12 DIAGNOSIS — N131 Hydronephrosis with ureteral stricture, not elsewhere classified: Secondary | ICD-10-CM | POA: Insufficient documentation

## 2014-03-12 DIAGNOSIS — C7951 Secondary malignant neoplasm of bone: Secondary | ICD-10-CM

## 2014-03-12 DIAGNOSIS — F17210 Nicotine dependence, cigarettes, uncomplicated: Secondary | ICD-10-CM | POA: Insufficient documentation

## 2014-03-12 DIAGNOSIS — D638 Anemia in other chronic diseases classified elsewhere: Secondary | ICD-10-CM | POA: Diagnosis not present

## 2014-03-12 DIAGNOSIS — Z590 Homelessness: Secondary | ICD-10-CM | POA: Diagnosis not present

## 2014-03-12 HISTORY — PX: PORTACATH PLACEMENT: SHX2246

## 2014-03-12 LAB — CBC WITH DIFFERENTIAL/PLATELET
Basophils Absolute: 0 10*3/uL (ref 0.0–0.1)
Basophils Relative: 0 % (ref 0–1)
Eosinophils Absolute: 0 10*3/uL (ref 0.0–0.7)
Eosinophils Relative: 0 % (ref 0–5)
HCT: 33 % — ABNORMAL LOW (ref 39.0–52.0)
HEMOGLOBIN: 11 g/dL — AB (ref 13.0–17.0)
LYMPHS PCT: 10 % — AB (ref 12–46)
Lymphs Abs: 1.2 10*3/uL (ref 0.7–4.0)
MCH: 28.8 pg (ref 26.0–34.0)
MCHC: 33.3 g/dL (ref 30.0–36.0)
MCV: 86.4 fL (ref 78.0–100.0)
MONOS PCT: 5 % (ref 3–12)
Monocytes Absolute: 0.6 10*3/uL (ref 0.1–1.0)
NEUTROS ABS: 10.7 10*3/uL — AB (ref 1.7–7.7)
Neutrophils Relative %: 85 % — ABNORMAL HIGH (ref 43–77)
Platelets: 444 10*3/uL — ABNORMAL HIGH (ref 150–400)
RBC: 3.82 MIL/uL — AB (ref 4.22–5.81)
RDW: 14.9 % (ref 11.5–15.5)
WBC: 12.6 10*3/uL — ABNORMAL HIGH (ref 4.0–10.5)

## 2014-03-12 LAB — PROTIME-INR
INR: 0.98 (ref 0.00–1.49)
Prothrombin Time: 13.1 seconds (ref 11.6–15.2)

## 2014-03-12 MED ORDER — FENTANYL CITRATE 0.05 MG/ML IJ SOLN
INTRAMUSCULAR | Status: AC
  Filled 2014-03-12: qty 4

## 2014-03-12 MED ORDER — LIDOCAINE HCL 1 % IJ SOLN
INTRAMUSCULAR | Status: AC
  Filled 2014-03-12: qty 40

## 2014-03-12 MED ORDER — IOHEXOL 300 MG/ML  SOLN: 10.0000 mL | Freq: Once | INTRAMUSCULAR | Status: DC | PRN

## 2014-03-12 MED ORDER — CEFAZOLIN SODIUM-DEXTROSE 2-3 GM-% IV SOLR
2.0000 g | Freq: Once | INTRAVENOUS | Status: AC
  Administered 2014-03-12: 2 g via INTRAVENOUS

## 2014-03-12 MED ORDER — CEFAZOLIN SODIUM-DEXTROSE 2-3 GM-% IV SOLR
INTRAVENOUS | Status: AC
  Filled 2014-03-12: qty 50

## 2014-03-12 MED ORDER — HEPARIN SOD (PORK) LOCK FLUSH 100 UNIT/ML IV SOLN
500.0000 [IU] | Freq: Once | INTRAVENOUS | Status: AC
  Administered 2014-03-12: 500 [IU]

## 2014-03-12 MED ORDER — MIDAZOLAM HCL 2 MG/2ML IJ SOLN
INTRAMUSCULAR | Status: AC
  Filled 2014-03-12: qty 4

## 2014-03-12 MED ORDER — HEPARIN SOD (PORK) LOCK FLUSH 100 UNIT/ML IV SOLN
INTRAVENOUS | Status: AC
  Filled 2014-03-12: qty 5

## 2014-03-12 MED ORDER — MIDAZOLAM HCL 2 MG/2ML IJ SOLN
INTRAMUSCULAR | Status: AC | PRN
  Administered 2014-03-12: 2 mg via INTRAVENOUS

## 2014-03-12 MED ORDER — IOHEXOL 300 MG/ML  SOLN
20.0000 mL | Freq: Once | INTRAMUSCULAR | Status: AC | PRN
  Administered 2014-03-12: 20 mL

## 2014-03-12 MED ORDER — SODIUM CHLORIDE 0.9 % IV SOLN
INTRAVENOUS | Status: DC
  Administered 2014-03-12: 12:00:00 via INTRAVENOUS

## 2014-03-12 MED ORDER — FENTANYL CITRATE 0.05 MG/ML IJ SOLN
INTRAMUSCULAR | Status: AC | PRN
  Administered 2014-03-12: 50 ug via INTRAVENOUS

## 2014-03-12 NOTE — Discharge Instructions (Signed)
Implanted Port Insertion, Care After °Refer to this sheet in the next few weeks. These instructions provide you with information on caring for yourself after your procedure. Your health care provider may also give you more specific instructions. Your treatment has been planned according to current medical practices, but problems sometimes occur. Call your health care provider if you have any problems or questions after your procedure. °WHAT TO EXPECT AFTER THE PROCEDURE °After your procedure, it is typical to have the following:  °· Discomfort at the port insertion site. Ice packs to the area will help. °· Bruising on the skin over the port. This will subside in 3-4 days. °HOME CARE INSTRUCTIONS °· After your port is placed, you will get a manufacturer's information card. The card has information about your port. Keep this card with you at all times.   °· Know what kind of port you have. There are many types of ports available.   °· Wear a medical alert bracelet in case of an emergency. This can help alert health care workers that you have a port.   °· The port can stay in for as long as your health care provider believes it is necessary.   °· A home health care nurse may give medicines and take care of the port.   °· You or a family member can get special training and directions for giving medicine and taking care of the port at home.   °SEEK MEDICAL CARE IF:  °· Your port does not flush or you are unable to get a blood return.   °· You have a fever or chills. °SEEK IMMEDIATE MEDICAL CARE IF: °· You have new fluid or pus coming from your incision.   °· You notice a bad smell coming from your incision site.   °· You have swelling, pain, or more redness at the incision or port site.   °· You have chest pain or shortness of breath. °Document Released: 01/31/2013 Document Revised: 04/17/2013 Document Reviewed: 01/31/2013 °ExitCare® Patient Information ©2015 [employer]. This information is not intended to replace  advice given to you by your health care provider. Make sure you discuss any questions you have with your health care provider. °Conscious Sedation, Adult, Care After °Refer to this sheet in the next few weeks. These instructions provide you with information on caring for yourself after your procedure. Your health care provider may also give you more specific instructions. Your treatment has been planned according to current medical practices, but problems sometimes occur. Call your health care provider if you have any problems or questions after your procedure. °WHAT TO EXPECT AFTER THE PROCEDURE  °After your procedure: °· You may feel sleepy, clumsy, and have poor balance for several hours. °· Vomiting may occur if you eat too soon after the procedure. °HOME CARE INSTRUCTIONS °· Do not participate in any activities where you could become injured for at least 24 hours. Do not: °¨ Drive. °¨ Swim. °¨ Ride a bicycle. °¨ Operate heavy machinery. °¨ Cook. °¨ Use power tools. °¨ Climb ladders. °¨ Work from a high place. °· Do not make important decisions or sign legal documents until you are improved. °· If you vomit, drink water, juice, or soup when you can drink without vomiting. Make sure you have little or no nausea before eating solid foods. °· Only take over-the-counter or prescription medicines for pain, discomfort, or fever as directed by your health care provider. °· Make sure you and your family fully understand everything about the medicines given to you, including what side effects   may occur.  You should not drink alcohol, take sleeping pills, or take medicines that cause drowsiness for at least 24 hours.  If you smoke, do not smoke without supervision.  If you are feeling better, you may resume normal activities 24 hours after you were sedated.  Keep all appointments with your health care provider. SEEK MEDICAL CARE IF:  Your skin is pale or bluish in color.  You continue to feel nauseous or  vomit.  Your pain is getting worse and is not helped by medicine.  You have bleeding or swelling.  You are still sleepy or feeling clumsy after 24 hours. SEEK IMMEDIATE MEDICAL CARE IF:  You develop a rash.  You have difficulty breathing.  You develop any type of allergic problem.  You have a fever. MAKE SURE YOU:  Understand these instructions.  Will watch your condition.  Will get help right away if you are not doing well or get worse. Document Released: 01/31/2013 Document Reviewed: 01/31/2013 [facility] Patient Information 2015 [place], [place]. This information is not intended to replace advice given to you by your health care provider. Make sure you discuss any questions you have with your health care provider. Percutaneous Nephrostomy, Care After Refer to this sheet in the next few weeks. These instructions provide you with information on caring for yourself after your procedure. Your health care provider may also give you more specific instructions. Your treatment has been planned according to current medical practices, but problems sometimes occur. Call your health care provider if you have any problems or questions after your procedure. WHAT TO EXPECT AFTER THE PROCEDURE You will need to remain lying down for several hours. HOME CARE INSTRUCTIONS  Your nephrostomy tube is connected to a leg bag or bedside drainage bag. Always keep the tubing, the leg bag, or the bedside drainage bags below the level of the kidney so that the urine drains freely.  During the day, if you are connecting the nephrostomy tube to a leg bag, be sure there are no kinks in the tubing and that the urine is draining freely.  At night, you may want to connect the nephrostomy tube or the leg bag to a larger bedside drainage bag.  Change the dressing as often as directed by your health care provider, or if it becomes wet.  Gently remove the tapes and dressing from around the nephrostomy tube. Be  careful not to pull on the tube while removing the dressing.  Wash the skin around the tube, rinse well, and dry.  Place two split drain sponges in and around the tube exit site.  Place tape around edge of the dressing.  Secure the nephrostomy tubing. Remember to make certain that the nephrostomy tube does not kink or become pinched closed. It can be useful to wrap any exposed tubing going from the nephrostomy tube to any of the connecting tubes to either the leg bag or drainage bag with an elastic bandage.  Every three weeks, replace the leg bag, drainage bag, and any extension tubing connected to your nephrostomy tube. Your health care provider will explain how to change the drainage bag and extension tubing. SEEK MEDICAL CARE IF:  You experience any problems with any of the valves or tubing.  You have persistent pain or soreness in your back.  You have a fever or chills. SEEK IMMEDIATE MEDICAL CARE IF:  You have abdominal pain during the first week.  You have a new appearance of blood in your urine.  You  have back pain that is not relieved by your medicine.  You have drainage, redness, swelling, or pain at the tube insertion site.  You have decreased urine output.  Your nephrostomy tube comes out. Document Released: 12/04/2003 Document Revised: 08/27/2013 Document Reviewed: 12/07/2012 [facility] Patient Information 2015 [place], [place]. This information is not intended to replace advice given to you by your health care provider. Make sure you discuss any questions you have with your health care provider.

## 2014-03-12 NOTE — H&P (Signed)
Chief Complaint: "I'm getting my kidney tubes changed and a port put in"  Referring Physician(s): Dr.Eskridge/Dr. Barnet Glasgow  History of Present Illness: Jeff [name] is a 68 y.o. male with history of stage IV prostate carcinoma and obstructive uropathy with bilateral percutaneous nephrostomies placed on 01/28/14 who presents today for bilateral nephrostomy tube exchanges as well as port a cath placement for chemotherapy.  Past Medical History  Diagnosis Date  . Acute renal failure 01/26/2014  . Anemia of chronic disease 01/26/2014  . Bilateral hydronephrosis 01/26/2014  . History of cocaine abuse 01/26/2014  . Homelessness 01/31/2014  . 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 Alcohol abuse     Past Surgical History  Procedure Laterality Date  . Orchiectomy Bilateral 02/05/2014    Procedure: BILATERAL ORCHIECTOMY;  Surgeon: Festus Aloe, MD;  Location: [facility];  Service: Urology;  Laterality: Bilateral;    Allergies: Review of patient's allergies indicates no known allergies.  Medications: Prior to Admission medications   Medication Sig Start Date End Date Taking? Authorizing Provider  acetaminophen (TYLENOL) 325 MG tablet Take 2 tablets (650 mg total) by mouth every 6 (six) hours as needed for mild pain (or Fever >/= 101). 02/01/14  Yes Venetia Maxon Rama, MD  calcium-vitamin D (OSCAL 500/200 D-3) 500-200 MG-UNIT per tablet Take 1 tablet by mouth 2 (two) times daily. 02/01/14  Yes Venetia Maxon Rama, MD  HYDROcodone-acetaminophen (NORCO) 5-325 MG per tablet Take 1 tablet by mouth every 6 (six) hours as needed for moderate pain. 02/05/14  Yes Milon Score, MD  dexamethasone (DECADRON) 4 MG tablet The day before, day of, and day after chemo take 2 tabs in the am and 2 tabs in the pm. (Take with food). Patient not taking: Reported on 03/08/2014 03/07/14   Baird Cancer, PA-C  lidocaine-prilocaine (EMLA) cream Apply a quarter size amount to port site 1 hour  prior to chemo. Do not rub in. Cover with plastic wrap. Patient not taking: Reported on 03/08/2014 03/07/14   Baird Cancer, PA-C  loperamide (IMODIUM A-D) 2 MG tablet After first loose stool take 2 tabs then take 1 tab every 2 hours as needed for loose stools. Call if diarrhea persists more than 12 hours. Patient not taking: Reported on 03/08/2014 03/07/14   Baird Cancer, PA-C  metoCLOPramide (REGLAN) 5 MG tablet The day after chemo take 1 tab four times a day x 2 days. Then may take 1 tab four times a day if needed for nausea/vomiting. Patient not taking: Reported on 03/08/2014 03/07/14   Baird Cancer, PA-C  prochlorperazine (COMPAZINE) 10 MG tablet The day after chemo take 1 tab four times a day x 2 days. Then may take 1 tab four times a day if needed for nausea/vomiting. Patient not taking: Reported on 03/08/2014 03/07/14   Baird Cancer, PA-C  traMADol (ULTRAM) 50 MG tablet Take 1 tablet (50 mg total) by mouth every 6 (six) hours as needed. Patient taking differently: Take 50 mg by mouth every 6 (six) hours as needed (Pain).  02/01/14   Venetia Maxon Rama, MD    Family History  Problem Relation Age of Onset  . Cancer Brother 27    History   Social History  . Marital Status: Single    Spouse Name: N/A    Number of Children: 1  . Years of Education: 9th   Occupational History  . disabilty    Social History Main Topics  . Smoking status: Current  Every Day Smoker -- 0.25 packs/day for 55 years    Types: Cigarettes  . Smokeless tobacco: Never Used  . Alcohol Use: No     Comment: none for at least 7 -8 years  . Drug Use: Yes    Special: Cocaine     Comment: last used Cocaine 01/25/14  . Sexual Activity: Yes   Other Topics Concern  . None   Social History Narrative   Lives at [employer] 9[employer] grade   1 son, lives in [place], [place]   Can read and write in native language                 Review of Systems  Constitutional: Negative for  fever.       Occ night sweats  Respiratory: Negative for cough and shortness of breath.   Cardiovascular: Negative for chest pain.  Gastrointestinal: Negative for nausea, vomiting, abdominal pain and blood in stool.  Genitourinary: Negative for dysuria, hematuria and flank pain.  Musculoskeletal: Negative for back pain.  Neurological: Negative for headaches.    Vital Signs: BP 128/83 mmHg  Pulse 75  Temp(Src) 97.3 F (36.3 C) (Oral)  Resp 18  Ht 5' 8.5" (1.74 m)  Wt 155 lb 9 oz (70.563 kg)  BMI 23.31 kg/m2  SpO2 100%  Physical Exam  Constitutional: He is oriented to person, place, and time. He appears well-developed and well-nourished.  Cardiovascular: Normal rate.   occ ectopy  Pulmonary/Chest: Effort normal and breath sounds normal.  Abdominal: Soft. Bowel sounds are normal. There is no tenderness.  Genitourinary:  Intact bilateral perc nephrostomies draining yellow urine  Musculoskeletal: Normal range of motion. He exhibits no edema.  Neurological: He is alert and oriented to person, place, and time.    Imaging: No results found.  Labs:  CBC:  Recent Labs  02/01/14 0420 02/05/14 0805 02/26/14 1647 03/12/14 1125  WBC 7.9 6.9 7.9 12.6*  HGB 8.6* 9.6* 10.4* 11.0*  HCT 25.3* 28.9* 31.2* 33.0*  PLT 283 315 474* 444*    COAGS:  Recent Labs  01/28/14 0402  INR 1.19  APTT 35    BMP:  Recent Labs  01/30/14 0344 01/31/14 0403 02/01/14 0420 02/06/14 1012 02/26/14 1647  NA 135* 135* 138 136 136*  K 4.7 4.8 5.0 4.7 4.9  CL 102 102 102 101 97  CO2 23 23 24 26 25   GLUCOSE 85 98 92 81 110*  BUN 17 17 18 23  26*  CALCIUM 8.8 8.8 8.9 9.0 9.4  CREATININE 1.65* 1.54* 1.52* 1.39* 1.67*  GFRNONAA 41* 45* 46*  --  41*  GFRAA 48* 52* 53*  --  47*    LIVER FUNCTION TESTS:  Recent Labs  01/26/14 0300 02/26/14 1647  BILITOT 0.3 <0.2*  AST 32 18  ALT 7 13  ALKPHOS 137* 323*  PROT 7.4 8.3  ALBUMIN 3.6 3.1*    TUMOR MARKERS: No results for  input(s): AFPTM, CEA, CA199, CHROMGRNA in the last 8760 hours.  Assessment and Plan: Jeff [name] is a 68 y.o. male with history of stage IV prostate carcinoma and obstructive uropathy with bilateral percutaneous nephrostomies placed 01/28/14 who presents today for bilateral nephrostomy tube exchanges as well as port a cath placement for chemotherapy. Details/risks of procedures d/w pt with his understanding and consent.          Signed: Autumn Messing 03/12/2014, 11:59 AM

## 2014-03-12 NOTE — Procedures (Signed)
Procedure:  Bilateral nephrostomy tube change Bilateral 10 Fr PCN's exchanged over wires and connected to new gravity bags.

## 2014-03-14 ENCOUNTER — Encounter (HOSPITAL_BASED_OUTPATIENT_CLINIC_OR_DEPARTMENT_OTHER): Payer: PRIVATE HEALTH INSURANCE

## 2014-03-14 ENCOUNTER — Encounter (HOSPITAL_COMMUNITY): Payer: Self-pay

## 2014-03-14 VITALS — BP 144/76 | HR 77 | Temp 97.7°F | Resp 18 | Wt 163.2 lb

## 2014-03-14 DIAGNOSIS — C772 Secondary and unspecified malignant neoplasm of intra-abdominal lymph nodes: Secondary | ICD-10-CM

## 2014-03-14 DIAGNOSIS — C7951 Secondary malignant neoplasm of bone: Secondary | ICD-10-CM

## 2014-03-14 DIAGNOSIS — J449 Chronic obstructive pulmonary disease, unspecified: Secondary | ICD-10-CM

## 2014-03-14 DIAGNOSIS — C61 Malignant neoplasm of prostate: Secondary | ICD-10-CM

## 2014-03-14 DIAGNOSIS — R590 Localized enlarged lymph nodes: Secondary | ICD-10-CM

## 2014-03-14 DIAGNOSIS — Z5111 Encounter for antineoplastic chemotherapy: Secondary | ICD-10-CM

## 2014-03-14 LAB — COMPREHENSIVE METABOLIC PANEL
ALK PHOS: 236 U/L — AB (ref 39–117)
ALT: 17 U/L (ref 0–53)
AST: 18 U/L (ref 0–37)
Albumin: 3.1 g/dL — ABNORMAL LOW (ref 3.5–5.2)
Anion gap: 15 (ref 5–15)
BUN: 34 mg/dL — ABNORMAL HIGH (ref 6–23)
CALCIUM: 9.2 mg/dL (ref 8.4–10.5)
CO2: 23 mEq/L (ref 19–32)
Chloride: 99 mEq/L (ref 96–112)
Creatinine, Ser: 1.47 mg/dL — ABNORMAL HIGH (ref 0.50–1.35)
GFR calc non Af Amer: 48 mL/min — ABNORMAL LOW (ref >90)
GFR, EST AFRICAN AMERICAN: 55 mL/min — AB (ref >90)
GLUCOSE: 174 mg/dL — AB (ref 70–99)
POTASSIUM: 4.5 meq/L (ref 3.7–5.3)
SODIUM: 137 meq/L (ref 137–147)
Total Bilirubin: <0.2 mg/dL — ABNORMAL LOW (ref 0.3–1.2)
Total Protein: 8.2 g/dL (ref 6.0–8.3)

## 2014-03-14 LAB — CBC WITH DIFFERENTIAL/PLATELET
BASOS ABS: 0 10*3/uL (ref 0.0–0.1)
Basophils Relative: 0 % (ref 0–1)
Eosinophils Absolute: 0 10*3/uL (ref 0.0–0.7)
Eosinophils Relative: 0 % (ref 0–5)
HCT: 30.5 % — ABNORMAL LOW (ref 39.0–52.0)
Hemoglobin: 10.4 g/dL — ABNORMAL LOW (ref 13.0–17.0)
LYMPHS ABS: 1.3 10*3/uL (ref 0.7–4.0)
LYMPHS PCT: 9 % — AB (ref 12–46)
MCH: 29.5 pg (ref 26.0–34.0)
MCHC: 34.1 g/dL (ref 30.0–36.0)
MCV: 86.6 fL (ref 78.0–100.0)
Monocytes Absolute: 0.7 10*3/uL (ref 0.1–1.0)
Monocytes Relative: 5 % (ref 3–12)
NEUTROS PCT: 86 % — AB (ref 43–77)
Neutro Abs: 11.4 10*3/uL — ABNORMAL HIGH (ref 1.7–7.7)
PLATELETS: 395 10*3/uL (ref 150–400)
RBC: 3.52 MIL/uL — AB (ref 4.22–5.81)
RDW: 15 % (ref 11.5–15.5)
WBC: 13.4 10*3/uL — AB (ref 4.0–10.5)

## 2014-03-14 MED ORDER — DEXAMETHASONE SODIUM PHOSPHATE 10 MG/ML IJ SOLN
10.0000 mg | Freq: Once | INTRAMUSCULAR | Status: DC
Start: 2014-03-14 — End: 2014-03-14

## 2014-03-14 MED ORDER — SODIUM CHLORIDE 0.9 % IV SOLN
Freq: Once | INTRAVENOUS | Status: AC
  Administered 2014-03-14: 8 mg via INTRAVENOUS
  Filled 2014-03-14: qty 4

## 2014-03-14 MED ORDER — HEPARIN SOD (PORK) LOCK FLUSH 100 UNIT/ML IV SOLN
INTRAVENOUS | Status: AC
  Filled 2014-03-14: qty 5

## 2014-03-14 MED ORDER — ONDANSETRON HCL 40 MG/20ML IJ SOLN: 8.0000 mg | Freq: Once | INTRAMUSCULAR | Status: DC

## 2014-03-14 MED ORDER — DOCETAXEL CHEMO INJECTION 160 MG/16ML
75.0000 mg/m2 | Freq: Once | INTRAVENOUS | Status: AC
  Administered 2014-03-14: 140 mg via INTRAVENOUS
  Filled 2014-03-14: qty 14

## 2014-03-14 MED ORDER — SODIUM CHLORIDE 0.9 % IV SOLN
Freq: Once | INTRAVENOUS | Status: AC
  Administered 2014-03-14: 09:00:00 via INTRAVENOUS

## 2014-03-14 MED ORDER — SODIUM CHLORIDE 0.9 % IJ SOLN: 10.0000 mL | INTRAMUSCULAR | Status: DC | PRN

## 2014-03-14 MED ORDER — OXYCODONE HCL 5 MG PO TABS: ORAL_TABLET | ORAL | Status: DC

## 2014-03-14 MED ORDER — HEPARIN SOD (PORK) LOCK FLUSH 100 UNIT/ML IV SOLN
500.0000 [IU] | Freq: Once | INTRAVENOUS | Status: AC | PRN
  Administered 2014-03-14: 500 [IU]

## 2014-03-14 NOTE — Patient Instructions (Signed)
[facility] Discharge Instructions for Patients Receiving Chemotherapy  Today you received the following chemotherapy agents taxotere Please call the clinic if you have any questions or concerns  To help prevent nausea and vomiting after your treatment, we encourage you to take your nausea medication  If you develop nausea and vomiting that is not controlled by your nausea medication, call the clinic. If it is after clinic hours your family physician or the after hours number for the clinic or go to the Emergency Department.   BELOW ARE SYMPTOMS THAT SHOULD BE REPORTED IMMEDIATELY:  *FEVER GREATER THAN 101.0 F  *CHILLS WITH OR WITHOUT FEVER  NAUSEA AND VOMITING THAT IS NOT CONTROLLED WITH YOUR NAUSEA MEDICATION  *UNUSUAL SHORTNESS OF BREATH  *UNUSUAL BRUISING OR BLEEDING  TENDERNESS IN MOUTH AND THROAT WITH OR WITHOUT PRESENCE OF ULCERS  *URINARY PROBLEMS  *BOWEL PROBLEMS  UNUSUAL RASH Items with * indicate a potential emergency and should be followed up as soon as possible.  One of the nurses will contact you 24 hours after your treatment. Please let the nurse know about any problems that you may have experienced. Feel free to call the clinic you have any questions or concerns. The clinic phone number is [phone].   I have been informed and understand all the instructions given to me. I know to contact the clinic, my physician, or go to the Emergency Department if any problems should occur. I do not have any questions at this time, but understand that I may call the clinic during office hours or the Patient Navigator at [phone] should I have any questions or need assistance in obtaining follow up care.    Docetaxel injection What is this medicine? DOCETAXEL (doe se TAX el) is a chemotherapy drug. It targets fast dividing cells, like cancer cells, and causes these cells to die. This medicine is used to treat many types of cancers like breast cancer,  certain stomach cancers, head and neck cancer, lung cancer, and prostate cancer. This medicine may be used for other purposes; ask your health care provider or pharmacist if you have questions. COMMON BRAND NAME(S): Docefrez, Taxotere What should I tell my health care provider before I take this medicine? They need to know if you have any of these conditions: -infection (especially a virus infection such as chickenpox, cold sores, or herpes) -liver disease -low blood counts, like low white cell, platelet, or red cell counts -an unusual or allergic reaction to docetaxel, polysorbate 80, other chemotherapy agents, other medicines, foods, dyes, or preservatives -pregnant or trying to get pregnant -breast-feeding How should I use this medicine? This drug is given as an infusion into a vein. It is administered in a hospital or clinic by a specially trained health care professional. Talk to your pediatrician regarding the use of this medicine in children. Special care may be needed. Overdosage: If you think you have taken too much of this medicine contact a poison control center or emergency room at once. NOTE: This medicine is only for you. Do not share this medicine with others. What if I miss a dose? It is important not to miss your dose. Call your doctor or health care professional if you are unable to keep an appointment. What may interact with this medicine? -cyclosporine -erythromycin -ketoconazole -medicines to increase blood counts like filgrastim, pegfilgrastim, sargramostim -vaccines Talk to your doctor or health care professional before taking any of these medicines: -acetaminophen -aspirin -ibuprofen -ketoprofen -naproxen This list may not   describe all possible interactions. Give your health care provider a list of all the medicines, herbs, non-prescription drugs, or dietary supplements you use. Also tell them if you smoke, drink alcohol, or use illegal drugs. Some items may  interact with your medicine. What should I watch for while using this medicine? Your condition will be monitored carefully while you are receiving this medicine. You will need important blood work done while you are taking this medicine. This drug may make you feel generally unwell. This is not uncommon, as chemotherapy can affect healthy cells as well as cancer cells. Report any side effects. Continue your course of treatment even though you feel ill unless your doctor tells you to stop. In some cases, you may be given additional medicines to help with side effects. Follow all directions for their use. Call your doctor or health care professional for advice if you get a fever, chills or sore throat, or other symptoms of a cold or flu. Do not treat yourself. This drug decreases your body's ability to fight infections. Try to avoid being around people who are sick. This medicine may increase your risk to bruise or bleed. Call your doctor or health care professional if you notice any unusual bleeding. Be careful brushing and flossing your teeth or using a toothpick because you may get an infection or bleed more easily. If you have any dental work done, tell your dentist you are receiving this medicine. Avoid taking products that contain aspirin, acetaminophen, ibuprofen, naproxen, or ketoprofen unless instructed by your doctor. These medicines may hide a fever. This medicine contains an alcohol in the product. You may get drowsy or dizzy. Do not drive, use machinery, or do anything that needs mental alertness until you know how this medicine affects you. Do not stand or sit up quickly, especially if you are an older patient. This reduces the risk of dizzy or fainting spells. Avoid alcoholic drinks Do not become pregnant while taking this medicine. Women should inform their doctor if they wish to become pregnant or think they might be pregnant. There is a potential for serious side effects to an unborn child.  Talk to your health care professional or pharmacist for more information. Do not breast-feed an infant while taking this medicine. What side effects may I notice from receiving this medicine? Side effects that you should report to your doctor or health care professional as soon as possible: -allergic reactions like skin rash, itching or hives, swelling of the face, lips, or tongue -low blood counts - This drug may decrease the number of white blood cells, red blood cells and platelets. You may be at increased risk for infections and bleeding. -signs of infection - fever or chills, cough, sore throat, pain or difficulty passing urine -signs of decreased platelets or bleeding - bruising, pinpoint red spots on the skin, black, tarry stools, nosebleeds -signs of decreased red blood cells - unusually weak or tired, fainting spells, lightheadedness -breathing problems -fast or irregular heartbeat -low blood pressure -mouth sores -nausea and vomiting -pain, swelling, redness or irritation at the injection site -pain, tingling, numbness in the hands or feet -swelling of the ankle, feet, hands -weight gain Side effects that usually do not require medical attention (report to your prescriber or health care professional if they continue or are bothersome): -bone pain -complete hair loss including hair on your head, underarms, pubic hair, eyebrows, and eyelashes -diarrhea -excessive tearing -changes in the color of fingernails -loosening of the fingernails -nausea -muscle pain -  red flush to skin -sweating -weak or tired This list may not describe all possible side effects. Call your doctor for medical advice about side effects. You may report side effects to FDA at 1-800-FDA-1088. Where should I keep my medicine? This drug is given in a hospital or clinic and will not be stored at home. NOTE: This sheet is a summary. It may not cover all possible information. If you have questions about this  medicine, talk to your doctor, pharmacist, or health care provider.  2015, [employer]. (2013-03-08 22:21:02)     

## 2014-03-14 NOTE — Progress Notes (Signed)
Jeff [name] Tolerated chemotherapy well today.  Discharged ambulatory 

## 2014-03-14 NOTE — Progress Notes (Signed)
[facility]  OFFICE PROGRESS NOTE  [name], MD [place] [id number]  DIAGNOSIS: Prostate cancer metastatic to multiple sites - Plan: CBC with Differential, Comprehensive metabolic panel, PSA, CBC with Differential  Bone metastases  Lymphadenopathy, retroperitoneal, Metastatic disease  Chief Complaint  Patient presents with  . Stage IV prostate cancer    CURRENT THERAPY: Status post orchiectomy 02/05/2014. Bilateral nephrostomy tubes (01/28/2014) for obstructive uropathy  INTERVAL HISTORY: Jeff [name] 68 y.o. 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  He had been admitted to hospital on 01/27/2014 with abdominal pain and was found to have massive bilateral retroperitoneal lymphadenopathy, sclerotic bone lesions, and obstructive uropathy. PSA was over 5000. He underwent biopsy of which peritoneal mass on 01/28/2014 showing adenocarcinoma. He also underwent bilateral nephrostomy tube placement on 01/28/2014 with normalization of his creatinine. Nephrostomy tubes were changed at the same time as light port was inserted. He feels well with good appetite and is concerned about smoking 10 cigarettes a week. I told him he did not need a nicotine patch and that he is in control of the cigarettes, they are not in control of him. Appetite is been good with regular bowel movements and passage of urine through his nephrostomy tubes as well as through his penis. He denies any hematuria, fever, night sweats, cough, wheezing, lower extremity swelling or redness, PND, orthopnea, palpitations, headache, or seizures.   MEDICAL HISTORY: Past Medical History  Diagnosis Date  . Acute renal failure 01/26/2014  . Anemia of chronic disease 01/26/2014  . Bilateral hydronephrosis 01/26/2014  .  History of cocaine abuse 01/26/2014  . Homelessness 01/31/2014  . 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 Alcohol abuse     INTERIM HISTORY: has Bone lesion; Acute renal failure; Bilateral hydronephrosis; Anemia of chronic disease; History of cocaine abuse; Prostate cancer metastatic to multiple sites; Homelessness; and Health care maintenance on his problem list.    ALLERGIES:  has No Known Allergies.  MEDICATIONS: has a current medication list which includes the following prescription(s): acetaminophen, calcium-vitamin d, dexamethasone, hydrocodone-acetaminophen, lidocaine-prilocaine, loperamide, metoclopramide, oxycodone, prochlorperazine, and tramadol, and the following Facility-Administered Medications: DOCEtaxel (TAXOTERE) 140 mg in dextrose 5 % 250 mL chemo infusion, heparin lock flush, and sodium chloride.  SURGICAL HISTORY:  Past Surgical History  Procedure Laterality Date  . Orchiectomy Bilateral 02/05/2014    Procedure: BILATERAL ORCHIECTOMY;  Surgeon: Festus Aloe, MD;  Location: [facility];  Service: Urology;  Laterality: Bilateral;  . Portacath placement Right 03/12/14    FAMILY HISTORY: family history includes Cancer (age of onset: 33) in his brother.  SOCIAL HISTORY:  reports that he has been smoking Cigarettes.  He has a 13.75 pack-year smoking history. He has never used smokeless tobacco. He reports that he uses illicit drugs (Cocaine). He reports that he does not drink alcohol.  REVIEW OF SYSTEMS:  Other than that discussed above is noncontributory.  PHYSICAL EXAMINATION: ECOG PERFORMANCE STATUS: 1 - Symptomatic but completely ambulatory  Blood pressure 144/76, pulse 77, temperature 97.7 F (36.5 C), temperature source Oral, resp. rate 18, weight 163 lb 3.2 oz (74.027 kg), SpO2 100 %.  GENERAL:alert, no distress and comfortable SKIN: skin color, texture, turgor are normal, no rashes or significant lesions EYES: PERLA; Conjunctiva are pink and  non-injected, sclera clear SINUSES: No redness or tenderness  over maxillary or ethmoid sinuses OROPHARYNX:no exudate, no erythema on lips, buccal mucosa, or tongue. NECK: supple, thyroid normal size, non-tender, without nodularity. No masses CHEST: Increased AP diameter with no breast masses or significant gynecomastia. LifePort in place in the right anterior chest. LYMPH:  no palpable lymphadenopathy in the cervical, axillary or inguinal LUNGS: clear to auscultation and percussion with normal breathing effort HEART: regular rate & rhythm and no murmurs. ABDOMEN:abdomen soft, non-tender and normal bowel sounds. Bilateral nephrostomy tubes draining well. MUSCULOSKELETAL:no cyanosis of digits and no clubbing. Range of motion normal.  NEURO: alert & oriented x 3 with fluent speech, no focal motor/sensory deficits   LABORATORY DATA: Infusion on 03/14/2014  Component Date Value Ref Range Status  . WBC 03/14/2014 13.4* 4.0 - 10.5 K/uL Final  . RBC 03/14/2014 3.52* 4.22 - 5.81 MIL/uL Final  . Hemoglobin 03/14/2014 10.4* 13.0 - 17.0 g/dL Final  . HCT 03/14/2014 30.5* 39.0 - 52.0 % Final  . MCV 03/14/2014 86.6  78.0 - 100.0 fL Final  . MCH 03/14/2014 29.5  26.0 - 34.0 pg Final  . MCHC 03/14/2014 34.1  30.0 - 36.0 g/dL Final  . RDW 03/14/2014 15.0  11.5 - 15.5 % Final  . Platelets 03/14/2014 395  150 - 400 K/uL Final  . Neutrophils Relative % 03/14/2014 86* 43 - 77 % Final  . Neutro Abs 03/14/2014 11.4* 1.7 - 7.7 K/uL Final  . Lymphocytes Relative 03/14/2014 9* 12 - 46 % Final  . Lymphs Abs 03/14/2014 1.3  0.7 - 4.0 K/uL Final  . Monocytes Relative 03/14/2014 5  3 - 12 % Final  . Monocytes Absolute 03/14/2014 0.7  0.1 - 1.0 K/uL Final  . Eosinophils Relative 03/14/2014 0  0 - 5 % Final  . Eosinophils Absolute 03/14/2014 0.0  0.0 - 0.7 K/uL Final  . Basophils Relative 03/14/2014 0  0 - 1 % Final  . Basophils Absolute 03/14/2014 0.0  0.0 - 0.1 K/uL Final  . Sodium 03/14/2014 137  137 - 147  mEq/L Final  . Potassium 03/14/2014 4.5  3.7 - 5.3 mEq/L Final  . Chloride 03/14/2014 99  96 - 112 mEq/L Final  . CO2 03/14/2014 23  19 - 32 mEq/L Final  . Glucose, Bld 03/14/2014 174* 70 - 99 mg/dL Final  . BUN 03/14/2014 34* 6 - 23 mg/dL Final  . Creatinine, Ser 03/14/2014 1.47* 0.50 - 1.35 mg/dL Final  . Calcium 03/14/2014 9.2  8.4 - 10.5 mg/dL Final  . Total Protein 03/14/2014 8.2  6.0 - 8.3 g/dL Final  . Albumin 03/14/2014 3.1* 3.5 - 5.2 g/dL Final  . AST 03/14/2014 18  0 - 37 U/L Final  . ALT 03/14/2014 17  0 - 53 U/L Final  . Alkaline Phosphatase 03/14/2014 236* 39 - 117 U/L Final  . Total Bilirubin 03/14/2014 <0.2* 0.3 - 1.2 mg/dL Final  . GFR calc non Af Amer 03/14/2014 48* >90 mL/min Final  . GFR calc Af Amer 03/14/2014 55* >90 mL/min Final   Comment: (NOTE) The eGFR has been calculated using the CKD EPI equation. This calculation has not been validated in all clinical situations. eGFR's persistently <90 mL/min signify possible Chronic Kidney Disease.   Georgiann Hahn gap 03/14/2014 15  5 - 15 Final  Hospital Outpatient Visit on 03/12/2014  Component Date Value Ref Range Status  . WBC 03/12/2014 12.6* 4.0 - 10.5 K/uL Final  . RBC 03/12/2014 3.82* 4.22 - 5.81 MIL/uL Final  . Hemoglobin 03/12/2014 11.0* 13.0 - 17.0  g/dL Final  . HCT 03/12/2014 33.0* 39.0 - 52.0 % Final  . MCV 03/12/2014 86.4  78.0 - 100.0 fL Final  . MCH 03/12/2014 28.8  26.0 - 34.0 pg Final  . MCHC 03/12/2014 33.3  30.0 - 36.0 g/dL Final  . RDW 03/12/2014 14.9  11.5 - 15.5 % Final  . Platelets 03/12/2014 444* 150 - 400 K/uL Final  . Neutrophils Relative % 03/12/2014 85* 43 - 77 % Final  . Neutro Abs 03/12/2014 10.7* 1.7 - 7.7 K/uL Final  . Lymphocytes Relative 03/12/2014 10* 12 - 46 % Final  . Lymphs Abs 03/12/2014 1.2  0.7 - 4.0 K/uL Final  . Monocytes Relative 03/12/2014 5  3 - 12 % Final  . Monocytes Absolute 03/12/2014 0.6  0.1 - 1.0 K/uL Final  . Eosinophils Relative 03/12/2014 0  0 - 5 % Final  .  Eosinophils Absolute 03/12/2014 0.0  0.0 - 0.7 K/uL Final  . Basophils Relative 03/12/2014 0  0 - 1 % Final  . Basophils Absolute 03/12/2014 0.0  0.0 - 0.1 K/uL Final  . Prothrombin Time 03/12/2014 13.1  11.6 - 15.2 seconds Final  . INR 03/12/2014 0.98  0.00 - 1.49 Final  Office Visit on 02/26/2014  Component Date Value Ref Range Status  . WBC 02/26/2014 7.9  4.0 - 10.5 K/uL Final  . RBC 02/26/2014 3.58* 4.22 - 5.81 MIL/uL Final  . Hemoglobin 02/26/2014 10.4* 13.0 - 17.0 g/dL Final  . HCT 02/26/2014 31.2* 39.0 - 52.0 % Final  . MCV 02/26/2014 87.2  78.0 - 100.0 fL Final  . MCH 02/26/2014 29.1  26.0 - 34.0 pg Final  . MCHC 02/26/2014 33.3  30.0 - 36.0 g/dL Final  . RDW 02/26/2014 14.3  11.5 - 15.5 % Final  . Platelets 02/26/2014 474* 150 - 400 K/uL Final  . Neutrophils Relative % 02/26/2014 70  43 - 77 % Final  . Neutro Abs 02/26/2014 5.6  1.7 - 7.7 K/uL Final  . Lymphocytes Relative 02/26/2014 18  12 - 46 % Final  . Lymphs Abs 02/26/2014 1.4  0.7 - 4.0 K/uL Final  . Monocytes Relative 02/26/2014 8  3 - 12 % Final  . Monocytes Absolute 02/26/2014 0.6  0.1 - 1.0 K/uL Final  . Eosinophils Relative 02/26/2014 4  0 - 5 % Final  . Eosinophils Absolute 02/26/2014 0.3  0.0 - 0.7 K/uL Final  . Basophils Relative 02/26/2014 0  0 - 1 % Final  . Basophils Absolute 02/26/2014 0.0  0.0 - 0.1 K/uL Final  . Sodium 02/26/2014 136* 137 - 147 mEq/L Final  . Potassium 02/26/2014 4.9  3.7 - 5.3 mEq/L Final  . Chloride 02/26/2014 97  96 - 112 mEq/L Final  . CO2 02/26/2014 25  19 - 32 mEq/L Final  . Glucose, Bld 02/26/2014 110* 70 - 99 mg/dL Final  . BUN 02/26/2014 26* 6 - 23 mg/dL Final  . Creatinine, Ser 02/26/2014 1.67* 0.50 - 1.35 mg/dL Final  . Calcium 02/26/2014 9.4  8.4 - 10.5 mg/dL Final  . Total Protein 02/26/2014 8.3  6.0 - 8.3 g/dL Final  . Albumin 02/26/2014 3.1* 3.5 - 5.2 g/dL Final  . AST 02/26/2014 18  0 - 37 U/L Final  . ALT 02/26/2014 13  0 - 53 U/L Final  . Alkaline Phosphatase  02/26/2014 323* 39 - 117 U/L Final  . Total Bilirubin 02/26/2014 <0.2* 0.3 - 1.2 mg/dL Final  . GFR calc non Af Amer 02/26/2014 41* >90 mL/min Final  .  GFR calc Af Amer 02/26/2014 47* >90 mL/min Final   Comment: (NOTE) The eGFR has been calculated using the CKD EPI equation. This calculation has not been validated in all clinical situations. eGFR's persistently <90 mL/min signify possible Chronic Kidney Disease.   . Anion gap 02/26/2014 14  5 - 15 Final  . PSA 02/26/2014 399.50* <=4.00 ng/mL Final   Comment: (NOTE) Result repeated and verified. Test Methodology: ECLIA PSA (Electrochemiluminescence Immunoassay) For PSA values from 2.5-4.0, particularly in younger men <35 years old, the AUA and NCCN suggest testing for % Free PSA (3515) and evaluation of the rate of increase in PSA (PSA velocity). Performed at [facility]:  adenocarcinoma  Urinalysis    Component Value Date/Time   COLORURINE YELLOW 01/25/2014 [place] 01/25/2014 2331   LABSPEC 1.015 01/25/2014 2331   PHURINE 5.5 01/25/2014 2331   GLUCOSEU NEGATIVE 01/25/2014 2331   HGBUR LARGE* 01/25/2014 2331   BILIRUBINUR NEGATIVE 01/25/2014 2331   KETONESUR NEGATIVE 01/25/2014 2331   PROTEINUR 100* 01/25/2014 2331   UROBILINOGEN 0.2 01/25/2014 2331   NITRITE NEGATIVE 01/25/2014 2331   LEUKOCYTESUR NEGATIVE 01/25/2014 2331    RADIOGRAPHIC STUDIES: Ir Nephrostomy Tube Change  03/12/2014   CLINICAL DATA:  Prostate carcinoma with ureteral obstruction requiring placement of bilateral percutaneous nephrostomy tubes on 01/28/2014. The patient requires exchange of the nephrostomy catheters.  EXAM: 1. EXCHANGE OF LEFT PERCUTANEOUS NEPHROSTOMY TUBE 2. EXCHANGE OF RIGHT PERCUTANEOUS NEPHROSTOMY TUBE  CONTRAST:  20 ml Omnipaque-300  FLUOROSCOPY TIME:  2 min and 36 seconds.  PROCEDURE: The procedure, risks, benefits, and alternatives were explained to the patient. Questions regarding the  procedure were encouraged and answered. The patient understands and consents to the procedure. A time-out procedure was performed.  Both flank regions and pre-existing nephrostomy tubes were prepped with Betadine in a sterile fashion, and a sterile drape was applied covering the operative field. sterile gown and sterile gloves were used for the procedure.  The preexisting catheters were injected with contrast material under fluoroscopy. Both were removed over guidewires. New 10 French nephrostomy tubes were advanced over wires and formed.  Final catheter position was confirmed with a fluoroscopic spot image obtained after injection of contrast.  COMPLICATIONS: None.  FINDINGS: Both nephrostomy tubes have migrated into the lower pole collecting systems. New nephrostomy tubes were advanced over guidewires and formed at the level of the renal pelvis.  IMPRESSION: Successful exchange of bilateral 10 French percutaneous nephrostomy tubes.   Electronically Signed   By: Aletta Edouard M.D.   On: 03/12/2014 17:58   Ir Nephrostomy Tube Change  03/12/2014   CLINICAL DATA:  Prostate carcinoma with ureteral obstruction requiring placement of bilateral percutaneous nephrostomy tubes on 01/28/2014. The patient requires exchange of the nephrostomy catheters.  EXAM: 1. EXCHANGE OF LEFT PERCUTANEOUS NEPHROSTOMY TUBE 2. EXCHANGE OF RIGHT PERCUTANEOUS NEPHROSTOMY TUBE  CONTRAST:  20 ml Omnipaque-300  FLUOROSCOPY TIME:  2 min and 36 seconds.  PROCEDURE: The procedure, risks, benefits, and alternatives were explained to the patient. Questions regarding the procedure were encouraged and answered. The patient understands and consents to the procedure. A time-out procedure was performed.  Both flank regions and pre-existing nephrostomy tubes were prepped with Betadine in a sterile fashion, and a sterile drape was applied covering the operative field. sterile gown and sterile gloves were used for the procedure.  The preexisting  catheters were injected with contrast material under fluoroscopy. Both were removed over guidewires. New 10 [place]  nephrostomy tubes were advanced over wires and formed.  Final catheter position was confirmed with a fluoroscopic spot image obtained after injection of contrast.  COMPLICATIONS: None.  FINDINGS: Both nephrostomy tubes have migrated into the lower pole collecting systems. New nephrostomy tubes were advanced over guidewires and formed at the level of the renal pelvis.  IMPRESSION: Successful exchange of bilateral 10 French percutaneous nephrostomy tubes.   Electronically Signed   By: Aletta Edouard M.D.   On: 03/12/2014 17:58   Ir Fluoro Guide Cv Line Right  03/12/2014   CLINICAL DATA:  Prostate carcinoma and need for porta cath for chemotherapy.  EXAM: IMPLANTED PORT A CATH PLACEMENT WITH ULTRASOUND AND FLUOROSCOPIC GUIDANCE  ANESTHESIA/SEDATION: 2.0 Mg IV Versed; 100 mcg IV Fentanyl  Total Moderate Sedation Time:  30 minutes  Additional Medications: 2 g IV Ancef. As antibiotic prophylaxis, Ancef was ordered pre-procedure and administered intravenously within one hour of incision.  FLUOROSCOPY TIME:  12 seconds.  PROCEDURE: The procedure, risks, benefits, and alternatives were explained to the patient. Questions regarding the procedure were encouraged and answered. The patient understands and consents to the procedure.  The right neck and chest were prepped with chlorhexidine in a sterile fashion, and a sterile drape was applied covering the operative field. Maximum barrier sterile technique with sterile gowns and gloves were used for the procedure. Local anesthesia was provided with 1% lidocaine. A time-out procedure was performed.  Ultrasound was used to confirm patency of the right internal jugular vein. After creating a small venotomy incision, a 21 gauge needle was advanced into the right internal jugular vein under direct, real-time ultrasound guidance. Ultrasound image documentation was  performed. After securing guidewire access, an 8 Fr dilator was placed. A J-wire was kinked to measure appropriate catheter length.  A subcutaneous port pocket was then created along the upper chest wall utilizing sharp and blunt dissection. Portable cautery was utilized. The pocket was irrigated with sterile saline.  A single lumen power injectable port was chosen for placement. The 8 Fr catheter was tunneled from the port pocket site to the venotomy incision. The port was placed in the pocket. External catheter was trimmed to appropriate length based on guidewire measurement.  At the venotomy, an 8 Fr peel-away sheath was placed over a guidewire. The catheter was then placed through the sheath and the sheath removed. Final catheter positioning was confirmed and documented with a fluoroscopic spot image. The port was accessed with a needle and aspirated and flushed with heparinized saline. The needle was removed.  The venotomy and port pocket incisions were closed with subcutaneous 3-0 Monocryl and subcuticular 4-0 Vicryl. Dermabond was applied to both incisions.  COMPLICATIONS: None  FINDINGS: After catheter placement, the tip lies at the cavoatrial junction. The catheter aspirates normally and is ready for immediate use.  IMPRESSION: Placement of single lumen port a cath via right internal jugular vein. The catheter tip lies at the cavoatrial junction. A power injectable port a cath was placed and is ready for immediate use.   Electronically Signed   By: Aletta Edouard M.D.   On: 03/12/2014 18:00   Ir US Guide Vasc Access Right  03/12/2014   CLINICAL DATA:  Prostate carcinoma and need for porta cath for chemotherapy.  EXAM: IMPLANTED PORT A CATH PLACEMENT WITH ULTRASOUND AND FLUOROSCOPIC GUIDANCE  ANESTHESIA/SEDATION: 2.0 Mg IV Versed; 100 mcg IV Fentanyl  Total Moderate Sedation Time:  30 minutes  Additional Medications: 2 g IV Ancef. As antibiotic prophylaxis, Ancef was  ordered pre-procedure and  administered intravenously within one hour of incision.  FLUOROSCOPY TIME:  12 seconds.  PROCEDURE: The procedure, risks, benefits, and alternatives were explained to the patient. Questions regarding the procedure were encouraged and answered. The patient understands and consents to the procedure.  The right neck and chest were prepped with chlorhexidine in a sterile fashion, and a sterile drape was applied covering the operative field. Maximum barrier sterile technique with sterile gowns and gloves were used for the procedure. Local anesthesia was provided with 1% lidocaine. A time-out procedure was performed.  Ultrasound was used to confirm patency of the right internal jugular vein. After creating a small venotomy incision, a 21 gauge needle was advanced into the right internal jugular vein under direct, real-time ultrasound guidance. Ultrasound image documentation was performed. After securing guidewire access, an 8 Fr dilator was placed. A J-wire was kinked to measure appropriate catheter length.  A subcutaneous port pocket was then created along the upper chest wall utilizing sharp and blunt dissection. Portable cautery was utilized. The pocket was irrigated with sterile saline.  A single lumen power injectable port was chosen for placement. The 8 Fr catheter was tunneled from the port pocket site to the venotomy incision. The port was placed in the pocket. External catheter was trimmed to appropriate length based on guidewire measurement.  At the venotomy, an 8 Fr peel-away sheath was placed over a guidewire. The catheter was then placed through the sheath and the sheath removed. Final catheter positioning was confirmed and documented with a fluoroscopic spot image. The port was accessed with a needle and aspirated and flushed with heparinized saline. The needle was removed.  The venotomy and port pocket incisions were closed with subcutaneous 3-0 Monocryl and subcuticular 4-0 Vicryl. Dermabond was applied to  both incisions.  COMPLICATIONS: None  FINDINGS: After catheter placement, the tip lies at the cavoatrial junction. The catheter aspirates normally and is ready for immediate use.  IMPRESSION: Placement of single lumen port a cath via right internal jugular vein. The catheter tip lies at the cavoatrial junction. A power injectable port a cath was placed and is ready for immediate use.   Electronically Signed   By: Aletta Edouard M.D.   On: 03/12/2014 18:00    ASSESSMENT:  #1. Stage IV prostate cancer with retroperitoneal lymph node and bone metastases, excellent response to orchiectomy with drop in PSA from over 5000. #2. History of polysubstance abuse, currently abstinent. #3. Chronic obstructive pulmonary disease. #4. Myelophthisic anemia.   PLAN:  #1. Docetaxel intravenously today. #2. Neulasta 28m subcutaneously tomorrow #3. Follow-up one week with CBC. Prescription was given for oxycodone to use for bone pain if needed after Neulasta injection.   All questions were answered. The patient knows to call the clinic with any problems, questions or concerns. We can certainly see the patient much sooner if necessary.   I spent 25 minutes counseling the patient face to face. The total time spent in the appointment was 30 minutes.    FDoroteo Bradford MD 03/14/2014 9:44 AM  DISCLAIMER:  This note was dictated with voice recognition software.  Similar sounding words can inadvertently be transcribed inaccurately and may not be corrected upon review.

## 2014-03-14 NOTE — Patient Instructions (Addendum)
[place] Discharge Instructions  RECOMMENDATIONS MADE BY THE CONSULTANT AND ANY TEST RESULTS WILL BE SENT TO YOUR REFERRING PHYSICIAN.  EXAM FINDINGS BY THE PHYSICIAN TODAY AND SIGNS OR SYMPTOMS TO REPORT TO CLINIC OR PRIMARY PHYSICIAN: Exam and findings as discussed by Dr.Formanek.  MEDICATIONS PRESCRIBED:  You were given an Jersie Beel prescription today for Oxycodone (pain medication). Continue medications as already prescribed.   INSTRUCTIONS/FOLLOW-UP: Return as scheduled. Report any issues/concerns to clinic as needed prior to appointments.  Thank you for choosing [facility] to provide your oncology and hematology care.  To afford each patient quality time with our providers, please arrive at least 15 minutes before your scheduled appointment time.  With your help, our goal is to use those 15 minutes to complete the necessary work-up to ensure our physicians have the information they need to help with your evaluation and healthcare recommendations.    Effective January 1st, 2014, we ask that you re-schedule your appointment with our physicians should you arrive 10 or more minutes late for your appointment.  We strive to give you quality time with our providers, and arriving late affects you and other patients whose appointments are after yours.    Again, thank you for choosing [facility].  Our hope is that these requests will decrease the amount of time that you wait before being seen by our physicians.       _____________________________________________________________  Should you have questions after your visit to [facility], please contact our office at [phone] between the hours of 8:30 a.m. and 4:30 p.m.  Voicemails left after 4:30 p.m. will not be returned until the following business day.  For prescription refill requests, have your pharmacy contact our office with your prescription refill request.     _______________________________________________________________  We hope that we have given you very good care.  You may receive a patient satisfaction survey in the mail, please complete it and return it as soon as possible.  We value your feedback!  _______________________________________________________________  Have you asked about our STAR program?  STAR stands for Survivorship Training and Rehabilitation, and this is a nationally recognized cancer care program that focuses on survivorship and rehabilitation.  Cancer and cancer treatments may cause problems, such as, pain, making you feel tired and keeping you from doing the things that you need or want to do. Cancer rehabilitation can help. Our goal is to reduce these troubling effects and help you have the best quality of life possible.  You may receive a survey from a nurse that asks questions about your current state of health.  Based on the survey results, all eligible patients will be referred to the [facility] program for an evaluation so we can better serve you!  A frequently asked questions sheet is available upon request. Smoking Cessation Quitting smoking is important to your health and has many advantages. However, it is not always easy to quit since nicotine is a very addictive drug. Oftentimes, people try 3 times or more before being able to quit. This document explains the best ways for you to prepare to quit smoking. Quitting takes hard work and a lot of effort, but you can do it. ADVANTAGES OF QUITTING SMOKING  You will live longer, feel better, and live better.  Your body will feel the impact of quitting smoking almost immediately.  Within 20 minutes, blood pressure decreases. Your pulse returns to its normal level.  After 8 hours, carbon  monoxide levels in the blood return to normal. Your oxygen level increases.  After 24 hours, the chance of having a heart attack starts to decrease. Your breath, hair, and body stop  smelling like smoke.  After 48 hours, damaged nerve endings begin to recover. Your sense of taste and smell improve.  After 72 hours, the body is virtually free of nicotine. Your bronchial tubes relax and breathing becomes easier.  After 2 to 12 weeks, lungs can hold more air. Exercise becomes easier and circulation improves.  The risk of having a heart attack, stroke, cancer, or lung disease is greatly reduced.  After 1 year, the risk of coronary heart disease is cut in half.  After 5 years, the risk of stroke falls to the same as a nonsmoker.  After 10 years, the risk of lung cancer is cut in half and the risk of other cancers decreases significantly.  After 15 years, the risk of coronary heart disease drops, usually to the level of a nonsmoker.  If you are pregnant, quitting smoking will improve your chances of having a healthy baby.  The people you live with, especially any children, will be healthier.  You will have extra money to spend on things other than cigarettes. QUESTIONS TO THINK ABOUT BEFORE ATTEMPTING TO QUIT You may want to talk about your answers with your health care provider.  Why do you want to quit?  If you tried to quit in the past, what helped and what did not?  What will be the most difficult situations for you after you quit? How will you plan to handle them?  Who can help you through the tough times? Your family? Friends? A health care provider?  What pleasures do you get from smoking? What ways can you still get pleasure if you quit? Here are some questions to ask your health care provider:  How can you help me to be successful at quitting?  What medicine do you think would be best for me and how should I take it?  What should I do if I need more help?  What is smoking withdrawal like? How can I get information on withdrawal? GET READY  Set a quit date.  Change your environment by getting rid of all cigarettes, ashtrays, matches, and lighters  in your home, car, or work. Do not let people smoke in your home.  Review your past attempts to quit. Think about what worked and what did not. GET SUPPORT AND ENCOURAGEMENT You have a better chance of being successful if you have help. You can get support in many ways.  Tell your family, friends, and coworkers that you are going to quit and need their support. Ask them not to smoke around you.  Get individual, group, or telephone counseling and support. Programs are available at [employer] and health centers. Call your local health department for information about programs in your area.  Spiritual beliefs and practices may help some smokers quit.  Download a "quit meter" on your computer to keep track of quit statistics, such as how long you have gone without smoking, cigarettes not smoked, and money saved.  Get a self-help book about quitting smoking and staying off tobacco. Ophir yourself from urges to smoke. Talk to someone, go for a walk, or occupy your time with a task.  Change your normal routine. Take a different route to work. Drink tea instead of coffee. Eat breakfast in a different place.  Reduce your stress. Take a hot bath, exercise, or read a book.  Plan something enjoyable to do every day. Reward yourself for not smoking.  Explore interactive web-based programs that specialize in helping you quit. GET MEDICINE AND USE IT CORRECTLY Medicines can help you stop smoking and decrease the urge to smoke. Combining medicine with the above behavioral methods and support can greatly increase your chances of successfully quitting smoking.  Nicotine replacement therapy helps deliver nicotine to your body without the negative effects and risks of smoking. Nicotine replacement therapy includes nicotine gum, lozenges, inhalers, nasal sprays, and skin patches. Some may be available over-the-counter and others require a  prescription.  Antidepressant medicine helps people abstain from smoking, but how this works is unknown. This medicine is available by prescription.  Nicotinic receptor partial agonist medicine simulates the effect of nicotine in your brain. This medicine is available by prescription. Ask your health care provider for advice about which medicines to use and how to use them based on your health history. Your health care provider will tell you what side effects to look out for if you choose to be on a medicine or therapy. Carefully read the information on the package. Do not use any other product containing nicotine while using a nicotine replacement product.  RELAPSE OR DIFFICULT SITUATIONS Most relapses occur within the first 3 months after quitting. Do not be discouraged if you start smoking again. Remember, most people try several times before finally quitting. You may have symptoms of withdrawal because your body is used to nicotine. You may crave cigarettes, be irritable, feel very hungry, cough often, get headaches, or have difficulty concentrating. The withdrawal symptoms are only temporary. They are strongest when you first quit, but they will go away within 10-14 days. To reduce the chances of relapse, try to:  Avoid drinking alcohol. Drinking lowers your chances of successfully quitting.  Reduce the amount of caffeine you consume. Once you quit smoking, the amount of caffeine in your body increases and can give you symptoms, such as a rapid heartbeat, sweating, and anxiety.  Avoid smokers because they can make you want to smoke.  Do not let weight gain distract you. Many smokers will gain weight when they quit, usually less than 10 pounds. Eat a healthy diet and stay active. You can always lose the weight gained after you quit.  Find ways to improve your mood other than smoking. FOR MORE INFORMATION  www.smokefree.gov  Document Released: 04/06/2001 Document Revised: 08/27/2013 Document  Reviewed: 07/22/2011 [facility] Patient Information 2015 [place], [place]. This information is not intended to replace advice given to you by your health care provider. Make sure you discuss any questions you have with your health care provider.

## 2014-03-15 ENCOUNTER — Ambulatory Visit (HOSPITAL_COMMUNITY): Payer: Medicare Other

## 2014-03-15 ENCOUNTER — Encounter (HOSPITAL_COMMUNITY): Payer: Self-pay

## 2014-03-15 ENCOUNTER — Encounter (HOSPITAL_BASED_OUTPATIENT_CLINIC_OR_DEPARTMENT_OTHER): Payer: PRIVATE HEALTH INSURANCE

## 2014-03-15 DIAGNOSIS — C61 Malignant neoplasm of prostate: Secondary | ICD-10-CM

## 2014-03-15 DIAGNOSIS — C7951 Secondary malignant neoplasm of bone: Secondary | ICD-10-CM

## 2014-03-15 LAB — PSA: PSA: 89.51 ng/mL — ABNORMAL HIGH (ref <=4.00)

## 2014-03-15 MED ORDER — PEGFILGRASTIM INJECTION 6 MG/0.6ML ~~LOC~~
PREFILLED_SYRINGE | SUBCUTANEOUS | Status: AC
  Filled 2014-03-15: qty 0.6

## 2014-03-15 MED ORDER — PEGFILGRASTIM INJECTION 6 MG/0.6ML ~~LOC~~
6.0000 mg | PREFILLED_SYRINGE | Freq: Once | SUBCUTANEOUS | Status: AC
  Administered 2014-03-15: 6 mg via SUBCUTANEOUS

## 2014-03-15 NOTE — Progress Notes (Signed)
Patient tolerated neulasta injection well. Twenty -four hour follow up completed after first chemotherapy treatment completed.   Patient denies any complaints.  Patient stated he was, "feeling pretty good".  Reminded of contact numbers for the department.

## 2014-03-15 NOTE — Patient Instructions (Signed)
[facility] Discharge Instructions  RECOMMENDATIONS MADE BY THE CONSULTANT AND ANY TEST RESULTS WILL BE SENT TO YOUR REFERRING PHYSICIAN.  You were given given Neulasta today.  Please call for any questions or concerns. Follow up as scheduled.    Thank you for choosing [facility] to provide your oncology and hematology care.  To afford each patient quality time with our providers, please arrive at least 15 minutes before your scheduled appointment time.  With your help, our goal is to use those 15 minutes to complete the necessary work-up to ensure our physicians have the information they need to help with your evaluation and healthcare recommendations.    Effective January 1st, 2014, we ask that you re-schedule your appointment with our physicians should you arrive 10 or more minutes late for your appointment.  We strive to give you quality time with our providers, and arriving late affects you and other patients whose appointments are after yours.    Again, thank you for choosing [facility].  Our hope is that these requests will decrease the amount of time that you wait before being seen by our physicians.       _____________________________________________________________  Should you have questions after your visit to [facility], please contact our office at [phone] between the hours of 8:30 a.m. and 4:30 p.m.  Voicemails left after 4:30 p.m. will not be returned until the following business day.  For prescription refill requests, have your pharmacy contact our office with your prescription refill request.    _______________________________________________________________  We hope that we have given you very good care.  You may receive a patient satisfaction survey in the mail, please complete it and return it as soon as possible.  We value your  feedback!  _______________________________________________________________  Have you asked about our STAR program?  STAR stands for Survivorship Training and Rehabilitation, and this is a nationally recognized cancer care program that focuses on survivorship and rehabilitation.  Cancer and cancer treatments may cause problems, such as, pain, making you feel tired and keeping you from doing the things that you need or want to do. Cancer rehabilitation can help. Our goal is to reduce these troubling effects and help you have the best quality of life possible.  You may receive a survey from a nurse that asks questions about your current state of health.  Based on the survey results, all eligible patients will be referred to the [facility] program for an evaluation so we can better serve you!  A frequently asked questions sheet is available upon request.

## 2014-03-17 NOTE — Progress Notes (Signed)
Jeff Ends, MD [place] [address]  Prostate cancer metastatic to multiple sites - Plan: CBC with Differential, Comprehensive metabolic panel, PSA  Bone lesion  Anemia of chronic disease  CURRENT THERAPY: Docetaxel chemotherapy beginning on 03/14/2014.  INTERVAL HISTORY: Jeff [name] 68 y.o. 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     Prostate cancer metastatic to multiple sites   01/26/2014 Tumor Marker PSA > 5000   01/28/2014 Initial Biopsy Metastatic adenocarcinoma of prostate   02/05/2014 Surgery Bilateral orchiectomy by Dr. Junious Silk   02/26/2014 Tumor Marker PSA = 399.5   03/14/2014 Tumor Marker PSA= 89.51   03/14/2014 -  Chemotherapy Docetaxel 75 mg/kg every 21 days   I personally reviewed and went over laboratory results with the patient.  The results are noted within this dictation.   Laboratory work from today is pending.   He tolerated his first cycle of chemotherapy well without any complaints to date. He denies any bone pain from Neulasta.   He reports that he thinks a sore throat is "coming on " on examination , no acute abnormalities are noted. He denies any fevers at home. He denies any sputum production from coughing or from nasal passages. He admits to a headache a few days ago that has resolved. He denies any sinus congestion or pressure. I recommended he take medication over-the-counter for his sore throat. He may take throat lozenges as well. He is to call us if his symptoms change.   Oncologically, the patient denies any complaints and review of systems is negative.  Past Medical History  Diagnosis Date  . Acute renal failure 01/26/2014  . Anemia of chronic disease 01/26/2014  . Bilateral hydronephrosis 01/26/2014  . History of cocaine abuse 01/26/2014  .  Homelessness 01/31/2014  . 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     has No Known Allergies.  Jeff [name] had no medications administered during this visit.  Past Surgical History  Procedure Laterality Date  . Orchiectomy Bilateral 02/05/2014    Procedure: BILATERAL ORCHIECTOMY;  Surgeon: Festus Aloe, MD;  Location: [facility];  Service: Urology;  Laterality: Bilateral;  . Portacath placement Right 03/12/14    Denies any headaches, dizziness, double vision, fevers, chills, night sweats, nausea, vomiting, diarrhea, constipation, chest pain, heart palpitations, shortness of breath, blood in stool, black tarry stool, urinary pain, urinary burning, urinary frequency, hematuria.   PHYSICAL EXAMINATION  ECOG PERFORMANCE STATUS: 1 - Symptomatic but completely ambulatory  Filed Vitals:   03/20/14 1135  BP: 96/66  Pulse: 84  Temp: 98.7 F (37.1 C)  Resp: 16    GENERAL:alert, no distress, well nourished, well developed, comfortable, cooperative and smiling SKIN: skin color, texture, turgor are normal, no rashes or significant lesions HEAD: Normocephalic, No masses, lesions, tenderness or abnormalities EYES: normal, PERRLA, EOMI, Conjunctiva are pink and non-injected EARS: External ears normal OROPHARYNX:mucous membranes are moist and poor dentition  NECK: supple, trachea midline, angle of the jaw adenopathy bilaterally LYMPH:  not examined BREAST:not examined LUNGS: clear to auscultation and percussion HEART: regular rate & rhythm, no murmurs and no gallops ABDOMEN:abdomen soft  and normal bowel sounds BACK: Back symmetric, no curvature. EXTREMITIES:less then 2 second capillary refill, no joint deformities, effusion, or  inflammation, no skin discoloration, no cyanosis  NEURO: alert & oriented x 3 with fluent speech, no focal motor/sensory deficits, gait normal   LABORATORY DATA: CBC    Component Value Date/Time   WBC 13.4* 03/14/2014 0843   RBC 3.52* 03/14/2014 0843   HGB 10.4* 03/14/2014 0843   HCT 30.5* 03/14/2014 0843   PLT 395 03/14/2014 0843   MCV 86.6 03/14/2014 0843   MCH 29.5 03/14/2014 0843   MCHC 34.1 03/14/2014 0843   RDW 15.0 03/14/2014 0843   LYMPHSABS 1.3 03/14/2014 0843   MONOABS 0.7 03/14/2014 0843   EOSABS 0.0 03/14/2014 0843   BASOSABS 0.0 03/14/2014 0843      Chemistry      Component Value Date/Time   NA 137 03/14/2014 0843   K 4.5 03/14/2014 0843   CL 99 03/14/2014 0843   CO2 23 03/14/2014 0843   BUN 34* 03/14/2014 0843   CREATININE 1.47* 03/14/2014 0843   CREATININE 1.39* 02/06/2014 1012      Component Value Date/Time   CALCIUM 9.2 03/14/2014 0843   ALKPHOS 236* 03/14/2014 0843   AST 18 03/14/2014 0843   ALT 17 03/14/2014 0843   BILITOT <0.2* 03/14/2014 0843     Lab Results  Component Value Date   PSA 89.51* 03/14/2014   PSA 399.50* 02/26/2014   PSA >5000.00* 01/26/2014      ASSESSMENT:  1. Stage IV prostate cancer with retroperitoneal lymph node and bone metastases, excellent response to orchiectomy with drop in PSA from over 5000. 2. History of polysubstance abuse, currently abstinent. 3. Chronic obstructive pulmonary disease. 4. Myelophthisic anemia. 5. Angle of the jaw adenopathy bilaterally, ?reactive  Patient Active Problem List   Diagnosis Date Noted  . Health care maintenance 02/06/2014  . Homelessness 01/31/2014  . 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 Bone lesion 01/26/2014  . Acute renal failure 01/26/2014  . Bilateral hydronephrosis 01/26/2014  . Anemia of chronic disease 01/26/2014  . History of cocaine abuse 01/26/2014     PLAN:  1. I personally reviewed and went over laboratory results with the patient.   The results are noted within this dictation. 2. Labs today: CBC diff 3. I have recommended over the counter sore throat medication including lozenges.  He is to call us with any changes in symptomatology 4. Return in 2 weeks for follow-up and start of cycle 2 of chemotherapy.   THERAPY PLAN:  Continue with systemic Docetaxel chemotherapy as planned.  All questions were answered. The patient knows to call the clinic with any problems, questions or concerns. We can certainly see the patient much sooner if necessary.  Patient and plan discussed with Dr. Farrel Gobble and he is in agreement with the aforementioned.   Jeff [name] 03/20/2014

## 2014-03-20 ENCOUNTER — Encounter (HOSPITAL_COMMUNITY): Payer: Self-pay | Admitting: Oncology

## 2014-03-20 ENCOUNTER — Encounter (HOSPITAL_COMMUNITY): Payer: PRIVATE HEALTH INSURANCE

## 2014-03-20 ENCOUNTER — Encounter (HOSPITAL_BASED_OUTPATIENT_CLINIC_OR_DEPARTMENT_OTHER): Payer: PRIVATE HEALTH INSURANCE | Admitting: Oncology

## 2014-03-20 VITALS — BP 96/66 | HR 84 | Temp 98.7°F | Resp 16 | Wt 166.2 lb

## 2014-03-20 DIAGNOSIS — M899 Disorder of bone, unspecified: Secondary | ICD-10-CM

## 2014-03-20 DIAGNOSIS — C61 Malignant neoplasm of prostate: Secondary | ICD-10-CM

## 2014-03-20 DIAGNOSIS — D638 Anemia in other chronic diseases classified elsewhere: Secondary | ICD-10-CM

## 2014-03-20 DIAGNOSIS — C772 Secondary and unspecified malignant neoplasm of intra-abdominal lymph nodes: Secondary | ICD-10-CM

## 2014-03-20 DIAGNOSIS — C7951 Secondary malignant neoplasm of bone: Secondary | ICD-10-CM

## 2014-03-20 DIAGNOSIS — J449 Chronic obstructive pulmonary disease, unspecified: Secondary | ICD-10-CM

## 2014-03-20 DIAGNOSIS — D6182 Myelophthisis: Secondary | ICD-10-CM

## 2014-03-20 DIAGNOSIS — J029 Acute pharyngitis, unspecified: Secondary | ICD-10-CM

## 2014-03-20 DIAGNOSIS — R599 Enlarged lymph nodes, unspecified: Secondary | ICD-10-CM

## 2014-03-20 LAB — CBC WITH DIFFERENTIAL/PLATELET
BLASTS: 0 %
Band Neutrophils: 0 % (ref 0–10)
Basophils Absolute: 0 10*3/uL (ref 0.0–0.1)
Basophils Relative: 0 % (ref 0–1)
EOS ABS: 0.3 10*3/uL (ref 0.0–0.7)
EOS PCT: 1 % (ref 0–5)
HCT: 30 % — ABNORMAL LOW (ref 39.0–52.0)
Hemoglobin: 9.9 g/dL — ABNORMAL LOW (ref 13.0–17.0)
LYMPHS PCT: 21 % (ref 12–46)
Lymphs Abs: 5.2 10*3/uL — ABNORMAL HIGH (ref 0.7–4.0)
MCH: 28.5 pg (ref 26.0–34.0)
MCHC: 33 g/dL (ref 30.0–36.0)
MCV: 86.5 fL (ref 78.0–100.0)
MONO ABS: 1.7 10*3/uL — AB (ref 0.1–1.0)
MONOS PCT: 7 % (ref 3–12)
Metamyelocytes Relative: 0 %
Myelocytes: 0 %
Neutro Abs: 17.7 10*3/uL — ABNORMAL HIGH (ref 1.7–7.7)
Neutrophils Relative %: 71 % (ref 43–77)
PLATELETS: 289 10*3/uL (ref 150–400)
Promyelocytes Absolute: 0 %
RBC: 3.47 MIL/uL — AB (ref 4.22–5.81)
RDW: 14.9 % (ref 11.5–15.5)
WBC: 24.9 10*3/uL — AB (ref 4.0–10.5)
nRBC: 0 /100 WBC

## 2014-03-20 NOTE — Progress Notes (Signed)
LABS DRAWN BY RN 

## 2014-03-20 NOTE — Patient Instructions (Signed)
[facility] Discharge Instructions  RECOMMENDATIONS MADE BY THE CONSULTANT AND ANY TEST RESULTS WILL BE SENT TO YOUR REFERRING PHYSICIAN.  We drew labs today and we will call you with results. You may take any over the counter medication for sore throat. Please call us if you develop fevers, sputum production that is thick, yellow, or green from cough and/or nasal passages.  Return in 2 weeks for chemotherapy and follow-up.  Next chemotherapy will be the start of cycle 2.  Please call the [facility] with any questions or concerns.   Thank you for choosing [facility] to provide your oncology and hematology care.  To afford each patient quality time with our providers, please arrive at least 15 minutes before your scheduled appointment time.  With your help, our goal is to use those 15 minutes to complete the necessary work-up to ensure our physicians have the information they need to help with your evaluation and healthcare recommendations.    Effective January 1st, 2014, we ask that you re-schedule your appointment with our physicians should you arrive 10 or more minutes late for your appointment.  We strive to give you quality time with our providers, and arriving late affects you and other patients whose appointments are after yours.    Again, thank you for choosing [facility].  Our hope is that these requests will decrease the amount of time that you wait before being seen by our physicians.       _____________________________________________________________  Should you have questions after your visit to [facility], please contact our office at [phone] between the hours of 8:30 a.m. and 5:00 p.m.  Voicemails left after 4:30 p.m. will not be returned until the following business day.  For prescription refill requests, have your pharmacy contact our office with your prescription refill request.

## 2014-03-27 ENCOUNTER — Other Ambulatory Visit (HOSPITAL_COMMUNITY): Payer: Self-pay | Admitting: Oncology

## 2014-03-27 DIAGNOSIS — C61 Malignant neoplasm of prostate: Secondary | ICD-10-CM

## 2014-03-27 DIAGNOSIS — M899 Disorder of bone, unspecified: Secondary | ICD-10-CM

## 2014-03-27 MED ORDER — OXYCODONE HCL 5 MG PO TABS: ORAL_TABLET | ORAL | Status: DC

## 2014-04-01 NOTE — Progress Notes (Signed)
[name], MD [place] [address]  Prostate cancer metastatic to multiple sites - Plan: CBC with Differential, Comprehensive metabolic panel, PSA  Bone metastases - Plan: CBC with Differential, Comprehensive metabolic panel, PSA  CURRENT THERAPY: Docetaxel chemotherapy beginning on 03/14/2014.  INTERVAL HISTORY: Cristo Ausburn 68 y.o. 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     Prostate cancer metastatic to multiple sites   01/26/2014 Tumor Marker PSA > 5000   01/28/2014 Initial Biopsy Metastatic adenocarcinoma of prostate   02/05/2014 Surgery Bilateral orchiectomy by Dr. Junious Silk   02/26/2014 Tumor Marker PSA = 399.5   03/14/2014 Tumor Marker PSA= 89.51   03/14/2014 -  Chemotherapy Docetaxel 75 mg/kg every 21 days   I personally reviewed and went over laboratory results with the patient.  The results are noted within this dictation.  He tolerated cycle 1 without any known issues.  He denies any peripheral neuropathy.  He denies any nausea and/or vomiting.  He notes that he is doing well.  I provided him education regarding his Stage IV disease and the fact that he has bone involvement.  He reports that he is aware of his stage and bone metastases.  With this information, I educated him regarding bone metastases and these are points of vulnerability for fracture.  Therefore, he is a candidate for Xgeva therapy.  I explained to him that this is an injection that is given to increase bone density and decrease risk of skeletal related event.  I reviewed the risks, benefits, alternatives (zometa, pamidronate, etc), and side effects including ONJ and hypocalcemia.  He understands this and is willing to proceed with this supportive therapy plan.  Oncologically, he denies any complaints and ROS questioning is negative.  Past Medical History  Diagnosis Date  . Acute renal failure  01/26/2014  . Anemia of chronic disease 01/26/2014  . Bilateral hydronephrosis 01/26/2014  . History of cocaine abuse 01/26/2014  . Homelessness 01/31/2014  . 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     has No Known Allergies.  [name] does not currently have medications on file.  Past Surgical History  Procedure Laterality Date  . Orchiectomy Bilateral 02/05/2014    Procedure: BILATERAL ORCHIECTOMY;  Surgeon: Festus Aloe, MD;  Location: [facility];  Service: Urology;  Laterality: Bilateral;  . Portacath placement Right 03/12/14    Denies any headaches, dizziness, double vision, fevers, chills, night sweats, nausea, vomiting, diarrhea, constipation, chest pain, heart palpitations, shortness of breath, blood in stool, black tarry stool, urinary pain, urinary burning, urinary frequency, hematuria.   PHYSICAL EXAMINATION  ECOG PERFORMANCE STATUS: 0 - Asymptomatic  There were no vitals filed for this visit.  GENERAL:alert, no distress, well nourished, well developed, comfortable, cooperative and smiling SKIN: skin color, texture, turgor are normal, no rashes or significant lesions HEAD: Normocephalic, No masses, lesions, tenderness or abnormalities EYES: normal, PERRLA, EOMI, Conjunctiva are pink and non-injected EARS: External ears normal OROPHARYNX:mucous membranes are moist  NECK: supple, trachea midline LYMPH:  no palpable lymphadenopathy BREAST:not examined LUNGS: clear to auscultation  HEART: regular rate & rhythm, no murmurs and no gallops ABDOMEN:abdomen soft, non-tender and normal bowel sounds BACK: Back symmetric,  no curvature. EXTREMITIES:less then 2 second capillary refill, no joint deformities, effusion,  or inflammation, no edema, no skin discoloration, no clubbing, no cyanosis  NEURO: alert & oriented x 3 with fluent speech, no focal motor/sensory deficits   LABORATORY DATA: CBC    Component Value Date/Time   WBC 9.6 04/04/2014 0853   RBC 3.26* 04/04/2014 0853   HGB 9.6* 04/04/2014 0853   HCT 28.8* 04/04/2014 0853   PLT 476* 04/04/2014 0853   MCV 88.3 04/04/2014 0853   MCH 29.4 04/04/2014 0853   MCHC 33.3 04/04/2014 0853   RDW 16.1* 04/04/2014 0853   LYMPHSABS 0.6* 04/04/2014 0853   MONOABS 0.2 04/04/2014 0853   EOSABS 0.0 04/04/2014 0853   BASOSABS 0.0 04/04/2014 0853      Chemistry      Component Value Date/Time   NA 136* 04/04/2014 0853   K 4.6 04/04/2014 0853   CL 98 04/04/2014 0853   CO2 23 04/04/2014 0853   BUN 21 04/04/2014 0853   CREATININE 1.40* 04/04/2014 0853   CREATININE 1.39* 02/06/2014 1012      Component Value Date/Time   CALCIUM 9.5 04/04/2014 0853   ALKPHOS 154* 04/04/2014 0853   AST 24 04/04/2014 0853   ALT 20 04/04/2014 0853   BILITOT <0.2* 04/04/2014 0853     Lab Results  Component Value Date   PSA 89.51* 03/14/2014   PSA 399.50* 02/26/2014   PSA >5000.00* 01/26/2014      ASSESSMENT:  1. Stage IV prostate cancer with retroperitoneal lymph node and bone metastases, excellent response to orchiectomy with drop in PSA from over 5000. 2. History of polysubstance abuse, currently abstinent. 3. Chronic obstructive pulmonary disease. 4. Myelophthisic anemia.  Patient Active Problem List   Diagnosis Date Noted  . Health care maintenance 02/06/2014  . Homelessness 01/31/2014  . 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 Bone lesion 01/26/2014  . Acute renal failure 01/26/2014  . Bilateral hydronephrosis 01/26/2014  . Anemia of chronic disease 01/26/2014  . History of cocaine abuse 01/26/2014     PLAN:  1. I personally reviewed and went over laboratory results with the patient.  The results are noted within this  dictation. 2. Pre-chemo labs as planned 3. Risks, benefits, alternatives, and side effects of Xgeva discussed 4. Rx for Oscal + D BID provided 5. Xgeva supportive therapy plan built, to start today. 6. Pre-chemo labs as ordered 7. Return in 3 weeks for follow-up   THERAPY PLAN:  Continue with systemic Docetaxel chemotherapy as planned.  All questions were answered. The patient knows to call the clinic with any problems, questions or concerns. We can certainly see the patient much sooner if necessary.  Patient and plan discussed with Dr. Farrel Gobble and he is in agreement with the aforementioned.   KEFALAS,THOMAS 04/04/2014

## 2014-04-04 ENCOUNTER — Encounter (HOSPITAL_COMMUNITY): Payer: Self-pay

## 2014-04-04 ENCOUNTER — Encounter (HOSPITAL_BASED_OUTPATIENT_CLINIC_OR_DEPARTMENT_OTHER): Payer: PRIVATE HEALTH INSURANCE | Admitting: Oncology

## 2014-04-04 ENCOUNTER — Encounter (HOSPITAL_COMMUNITY): Payer: PRIVATE HEALTH INSURANCE | Attending: Hematology and Oncology

## 2014-04-04 DIAGNOSIS — C7951 Secondary malignant neoplasm of bone: Secondary | ICD-10-CM | POA: Insufficient documentation

## 2014-04-04 DIAGNOSIS — C779 Secondary and unspecified malignant neoplasm of lymph node, unspecified: Secondary | ICD-10-CM | POA: Insufficient documentation

## 2014-04-04 DIAGNOSIS — Z87898 Personal history of other specified conditions: Secondary | ICD-10-CM | POA: Insufficient documentation

## 2014-04-04 DIAGNOSIS — C61 Malignant neoplasm of prostate: Secondary | ICD-10-CM | POA: Diagnosis not present

## 2014-04-04 DIAGNOSIS — J449 Chronic obstructive pulmonary disease, unspecified: Secondary | ICD-10-CM | POA: Diagnosis not present

## 2014-04-04 DIAGNOSIS — M899 Disorder of bone, unspecified: Secondary | ICD-10-CM

## 2014-04-04 DIAGNOSIS — D6182 Myelophthisis: Secondary | ICD-10-CM

## 2014-04-04 DIAGNOSIS — C772 Secondary and unspecified malignant neoplasm of intra-abdominal lymph nodes: Secondary | ICD-10-CM

## 2014-04-04 DIAGNOSIS — Z5111 Encounter for antineoplastic chemotherapy: Secondary | ICD-10-CM

## 2014-04-04 LAB — COMPREHENSIVE METABOLIC PANEL
ALT: 20 U/L (ref 0–53)
ANION GAP: 15 (ref 5–15)
AST: 24 U/L (ref 0–37)
Albumin: 3.2 g/dL — ABNORMAL LOW (ref 3.5–5.2)
Alkaline Phosphatase: 154 U/L — ABNORMAL HIGH (ref 39–117)
BUN: 21 mg/dL (ref 6–23)
CALCIUM: 9.5 mg/dL (ref 8.4–10.5)
CO2: 23 mEq/L (ref 19–32)
CREATININE: 1.4 mg/dL — AB (ref 0.50–1.35)
Chloride: 98 mEq/L (ref 96–112)
GFR, EST AFRICAN AMERICAN: 59 mL/min — AB (ref >90)
GFR, EST NON AFRICAN AMERICAN: 50 mL/min — AB (ref >90)
GLUCOSE: 149 mg/dL — AB (ref 70–99)
Potassium: 4.6 mEq/L (ref 3.7–5.3)
Sodium: 136 mEq/L — ABNORMAL LOW (ref 137–147)
Total Bilirubin: <0.2 mg/dL — ABNORMAL LOW (ref 0.3–1.2)
Total Protein: 7.8 g/dL (ref 6.0–8.3)

## 2014-04-04 LAB — CBC WITH DIFFERENTIAL/PLATELET
Basophils Absolute: 0 10*3/uL (ref 0.0–0.1)
Basophils Relative: 0 % (ref 0–1)
EOS ABS: 0 10*3/uL (ref 0.0–0.7)
EOS PCT: 0 % (ref 0–5)
HCT: 28.8 % — ABNORMAL LOW (ref 39.0–52.0)
Hemoglobin: 9.6 g/dL — ABNORMAL LOW (ref 13.0–17.0)
LYMPHS ABS: 0.6 10*3/uL — AB (ref 0.7–4.0)
Lymphocytes Relative: 6 % — ABNORMAL LOW (ref 12–46)
MCH: 29.4 pg (ref 26.0–34.0)
MCHC: 33.3 g/dL (ref 30.0–36.0)
MCV: 88.3 fL (ref 78.0–100.0)
MONO ABS: 0.2 10*3/uL (ref 0.1–1.0)
Monocytes Relative: 2 % — ABNORMAL LOW (ref 3–12)
Neutro Abs: 8.8 10*3/uL — ABNORMAL HIGH (ref 1.7–7.7)
Neutrophils Relative %: 92 % — ABNORMAL HIGH (ref 43–77)
Platelets: 476 10*3/uL — ABNORMAL HIGH (ref 150–400)
RBC: 3.26 MIL/uL — ABNORMAL LOW (ref 4.22–5.81)
RDW: 16.1 % — ABNORMAL HIGH (ref 11.5–15.5)
WBC: 9.6 10*3/uL (ref 4.0–10.5)

## 2014-04-04 MED ORDER — SODIUM CHLORIDE 0.9 % IV SOLN
Freq: Once | INTRAVENOUS | Status: AC
  Administered 2014-04-04: 8 mg via INTRAVENOUS
  Filled 2014-04-04: qty 4

## 2014-04-04 MED ORDER — SODIUM CHLORIDE 0.9 % IJ SOLN: 10.0000 mL | INTRAMUSCULAR | Status: DC | PRN

## 2014-04-04 MED ORDER — CALCIUM CARBONATE-VITAMIN D 500-200 MG-UNIT PO TABS: 2.0000 | ORAL_TABLET | Freq: Every day | ORAL | Status: DC

## 2014-04-04 MED ORDER — DEXAMETHASONE SODIUM PHOSPHATE 10 MG/ML IJ SOLN: 10.0000 mg | Freq: Once | INTRAMUSCULAR | Status: DC

## 2014-04-04 MED ORDER — SODIUM CHLORIDE 0.9 % IV SOLN: 8.0000 mg | Freq: Once | INTRAVENOUS | Status: DC

## 2014-04-04 MED ORDER — DOCETAXEL CHEMO INJECTION 160 MG/16ML
75.0000 mg/m2 | Freq: Once | INTRAVENOUS | Status: AC
  Administered 2014-04-04: 140 mg via INTRAVENOUS
  Filled 2014-04-04: qty 14

## 2014-04-04 MED ORDER — SODIUM CHLORIDE 0.9 % IV SOLN
Freq: Once | INTRAVENOUS | Status: AC
  Administered 2014-04-04: 10:00:00 via INTRAVENOUS

## 2014-04-04 MED ORDER — DENOSUMAB 120 MG/1.7ML ~~LOC~~ SOLN
120.0000 mg | Freq: Once | SUBCUTANEOUS | Status: AC
  Administered 2014-04-04: 120 mg via SUBCUTANEOUS
  Filled 2014-04-04: qty 1.7

## 2014-04-04 MED ORDER — HEPARIN SOD (PORK) LOCK FLUSH 100 UNIT/ML IV SOLN
500.0000 [IU] | Freq: Once | INTRAVENOUS | Status: AC | PRN
  Administered 2014-04-04: 500 [IU]
  Filled 2014-04-04: qty 5

## 2014-04-04 NOTE — Progress Notes (Unsigned)
Patient tolerated treatment well. Consent for Xgeva obtained.

## 2014-04-04 NOTE — Patient Instructions (Signed)
[facility] Discharge Instructions  RECOMMENDATIONS MADE BY THE CONSULTANT AND ANY TEST RESULTS WILL BE SENT TO YOUR REFERRING PHYSICIAN.  We will continue with chemotherapy as planned. We will start Xgeva for your bone lesions to reduce the risk of fracture at these weak points in the bone. Prescription for Oscal + D which is calcium and vitamin D to help with your bone strength.  Take 2 tablets daily.  You may take them together if you wish. Please let us know before any dental procedure is scheduled.  Please call with any dental or jaw changes.  Return in 3 weeks for cycle 3 of chemotherapy and office appointment.  Please call the [facility] with any questions or concerns.  Thank you for choosing [facility] to provide your oncology and hematology care.  To afford each patient quality time with our providers, please arrive at least 15 minutes before your scheduled appointment time.  With your help, our goal is to use those 15 minutes to complete the necessary work-up to ensure our physicians have the information they need to help with your evaluation and healthcare recommendations.    Effective January 1st, 2014, we ask that you re-schedule your appointment with our physicians should you arrive 10 or more minutes late for your appointment.  We strive to give you quality time with our providers, and arriving late affects you and other patients whose appointments are after yours.    Again, thank you for choosing [facility].  Our hope is that these requests will decrease the amount of time that you wait before being seen by our physicians.       _____________________________________________________________  Should you have questions after your visit to [facility], please contact our office at [phone] between the hours of 8:30 a.m. and 5:00 p.m.  Voicemails left after 4:30 p.m. will not be returned until the  following business day.  For prescription refill requests, have your pharmacy contact our office with your prescription refill request.

## 2014-04-04 NOTE — Patient Instructions (Signed)
[facility] Discharge Instructions for Patients Receiving Chemotherapy  Today you received the following chemotherapy agents Taxotere  Please return as scheduled. Please call for any questions or concerns.    If you develop nausea and vomiting, or diarrhea that is not controlled by your medication, call the clinic.  The clinic phone number is [phone]. Office hours are Monday-Friday 8:30am-5:00pm.  BELOW ARE SYMPTOMS THAT SHOULD BE REPORTED IMMEDIATELY:  *FEVER GREATER THAN 101.0 F  *CHILLS WITH OR WITHOUT FEVER  NAUSEA AND VOMITING THAT IS NOT CONTROLLED WITH YOUR NAUSEA MEDICATION  *UNUSUAL SHORTNESS OF BREATH  *UNUSUAL BRUISING OR BLEEDING  TENDERNESS IN MOUTH AND THROAT WITH OR WITHOUT PRESENCE OF ULCERS  *URINARY PROBLEMS  *BOWEL PROBLEMS  UNUSUAL RASH Items with * indicate a potential emergency and should be followed up as soon as possible. If you have an emergency after office hours please contact your primary care physician or go to the nearest emergency department.  Please call the clinic during office hours if you have any questions or concerns.   You may also contact the Patient Navigator at [phone] should you have any questions or need assistance in obtaining follow up care. _____________________________________________________________________ Have you asked about our STAR program?    STAR stands for Survivorship Training and Rehabilitation, and this is a nationally recognized cancer care program that focuses on survivorship and rehabilitation.  Cancer and cancer treatments may cause problems, such as, pain, making you feel tired and keeping you from doing the things that you need or want to do. Cancer rehabilitation can help. Our goal is to reduce these troubling effects and help you have the best quality of life possible.  You may receive a survey from a nurse that asks questions about your current state of health.  Based on the survey  results, all eligible patients will be referred to the [facility] program for an evaluation so we can better serve you! A frequently asked questions sheet is available upon request.

## 2014-04-05 ENCOUNTER — Encounter (HOSPITAL_BASED_OUTPATIENT_CLINIC_OR_DEPARTMENT_OTHER): Payer: PRIVATE HEALTH INSURANCE

## 2014-04-05 DIAGNOSIS — C7951 Secondary malignant neoplasm of bone: Secondary | ICD-10-CM

## 2014-04-05 DIAGNOSIS — C61 Malignant neoplasm of prostate: Secondary | ICD-10-CM

## 2014-04-05 DIAGNOSIS — Z5189 Encounter for other specified aftercare: Secondary | ICD-10-CM

## 2014-04-05 LAB — PSA: PSA: 31.39 ng/mL — ABNORMAL HIGH (ref <=4.00)

## 2014-04-05 MED ORDER — PEGFILGRASTIM INJECTION 6 MG/0.6ML ~~LOC~~
6.0000 mg | PREFILLED_SYRINGE | Freq: Once | SUBCUTANEOUS | Status: AC
  Administered 2014-04-05: 6 mg via SUBCUTANEOUS

## 2014-04-05 NOTE — Progress Notes (Signed)
Patient tolerated injection well.

## 2014-04-09 ENCOUNTER — Other Ambulatory Visit (HOSPITAL_COMMUNITY): Payer: Self-pay | Admitting: Oncology

## 2014-04-09 DIAGNOSIS — M899 Disorder of bone, unspecified: Secondary | ICD-10-CM

## 2014-04-09 DIAGNOSIS — C61 Malignant neoplasm of prostate: Secondary | ICD-10-CM

## 2014-04-09 MED ORDER — OXYCODONE HCL 5 MG PO TABS: ORAL_TABLET | ORAL | Status: DC

## 2014-04-22 ENCOUNTER — Other Ambulatory Visit: Payer: Self-pay | Admitting: Urology

## 2014-04-22 ENCOUNTER — Ambulatory Visit (HOSPITAL_COMMUNITY)
Admission: RE | Admit: 2014-04-22 | Discharge: 2014-04-22 | Disposition: A | Discharge status: Home / Self Care | Payer: PRIVATE HEALTH INSURANCE | Source: Ambulatory Visit | Attending: Urology | Admitting: Urology

## 2014-04-22 DIAGNOSIS — C61 Malignant neoplasm of prostate: Secondary | ICD-10-CM

## 2014-04-22 DIAGNOSIS — Z436 Encounter for attention to other artificial openings of urinary tract: Secondary | ICD-10-CM | POA: Insufficient documentation

## 2014-04-22 DIAGNOSIS — Z8546 Personal history of malignant neoplasm of prostate: Secondary | ICD-10-CM | POA: Insufficient documentation

## 2014-04-22 MED ORDER — IOHEXOL 300 MG/ML  SOLN
50.0000 mL | Freq: Once | INTRAMUSCULAR | Status: AC | PRN
  Administered 2014-04-22: 20 mL

## 2014-04-22 MED ORDER — LIDOCAINE HCL 1 % IJ SOLN
INTRAMUSCULAR | Status: AC
  Filled 2014-04-22: qty 20

## 2014-04-22 NOTE — Procedures (Signed)
Successful bilateral PCN exchange.  No immediate complications.

## 2014-04-23 ENCOUNTER — Telehealth (HOSPITAL_COMMUNITY): Payer: Self-pay | Admitting: *Deleted

## 2014-04-24 ENCOUNTER — Encounter (HOSPITAL_COMMUNITY): Payer: Self-pay

## 2014-04-24 ENCOUNTER — Encounter (HOSPITAL_BASED_OUTPATIENT_CLINIC_OR_DEPARTMENT_OTHER): Payer: PRIVATE HEALTH INSURANCE

## 2014-04-24 VITALS — BP 110/77 | HR 79 | Temp 98.5°F | Resp 18 | Wt 174.6 lb

## 2014-04-24 DIAGNOSIS — C772 Secondary and unspecified malignant neoplasm of intra-abdominal lymph nodes: Secondary | ICD-10-CM

## 2014-04-24 DIAGNOSIS — M899 Disorder of bone, unspecified: Secondary | ICD-10-CM

## 2014-04-24 DIAGNOSIS — C61 Malignant neoplasm of prostate: Secondary | ICD-10-CM

## 2014-04-24 DIAGNOSIS — C7951 Secondary malignant neoplasm of bone: Secondary | ICD-10-CM

## 2014-04-24 DIAGNOSIS — Z5111 Encounter for antineoplastic chemotherapy: Secondary | ICD-10-CM

## 2014-04-24 LAB — CBC WITH DIFFERENTIAL/PLATELET
Basophils Absolute: 0 10*3/uL (ref 0.0–0.1)
Basophils Relative: 0 % (ref 0–1)
Eosinophils Absolute: 0.1 10*3/uL (ref 0.0–0.7)
Eosinophils Relative: 1 % (ref 0–5)
HEMATOCRIT: 29.5 % — AB (ref 39.0–52.0)
Hemoglobin: 9.6 g/dL — ABNORMAL LOW (ref 13.0–17.0)
LYMPHS ABS: 1.5 10*3/uL (ref 0.7–4.0)
LYMPHS PCT: 15 % (ref 12–46)
MCH: 29.5 pg (ref 26.0–34.0)
MCHC: 32.5 g/dL (ref 30.0–36.0)
MCV: 90.8 fL (ref 78.0–100.0)
MONO ABS: 1.1 10*3/uL — AB (ref 0.1–1.0)
Monocytes Relative: 12 % (ref 3–12)
NEUTROS ABS: 6.9 10*3/uL (ref 1.7–7.7)
Neutrophils Relative %: 72 % (ref 43–77)
Platelets: 395 10*3/uL (ref 150–400)
RBC: 3.25 MIL/uL — AB (ref 4.22–5.81)
RDW: 17.7 % — ABNORMAL HIGH (ref 11.5–15.5)
WBC: 9.6 10*3/uL (ref 4.0–10.5)

## 2014-04-24 LAB — COMPREHENSIVE METABOLIC PANEL
ALT: 17 U/L (ref 0–53)
AST: 16 U/L (ref 0–37)
Albumin: 3.5 g/dL (ref 3.5–5.2)
Alkaline Phosphatase: 102 U/L (ref 39–117)
Anion gap: 5 (ref 5–15)
BUN: 22 mg/dL (ref 6–23)
CALCIUM: 8 mg/dL — AB (ref 8.4–10.5)
CO2: 24 mmol/L (ref 19–32)
CREATININE: 1.5 mg/dL — AB (ref 0.50–1.35)
Chloride: 106 mEq/L (ref 96–112)
GFR, EST AFRICAN AMERICAN: 54 mL/min — AB (ref >90)
GFR, EST NON AFRICAN AMERICAN: 46 mL/min — AB (ref >90)
GLUCOSE: 107 mg/dL — AB (ref 70–99)
Potassium: 4.3 mmol/L (ref 3.5–5.1)
Sodium: 135 mmol/L (ref 135–145)
Total Bilirubin: 0.3 mg/dL (ref 0.3–1.2)
Total Protein: 7.5 g/dL (ref 6.0–8.3)

## 2014-04-24 MED ORDER — DEXAMETHASONE 4 MG PO TABS: ORAL_TABLET | ORAL | Status: DC

## 2014-04-24 MED ORDER — SODIUM CHLORIDE 0.9 % IV SOLN
Freq: Once | INTRAVENOUS | Status: AC
  Administered 2014-04-24: 8 mg via INTRAVENOUS
  Filled 2014-04-24: qty 4

## 2014-04-24 MED ORDER — DEXAMETHASONE SODIUM PHOSPHATE 10 MG/ML IJ SOLN: 10.0000 mg | Freq: Once | INTRAMUSCULAR | Status: DC

## 2014-04-24 MED ORDER — DOCETAXEL CHEMO INJECTION 160 MG/16ML
75.0000 mg/m2 | Freq: Once | INTRAVENOUS | Status: AC
  Administered 2014-04-24: 140 mg via INTRAVENOUS
  Filled 2014-04-24: qty 14

## 2014-04-24 MED ORDER — HEPARIN SOD (PORK) LOCK FLUSH 100 UNIT/ML IV SOLN
500.0000 [IU] | Freq: Once | INTRAVENOUS | Status: AC | PRN
  Administered 2014-04-24: 500 [IU]
  Filled 2014-04-24: qty 5

## 2014-04-24 MED ORDER — SODIUM CHLORIDE 0.9 % IV SOLN: 8.0000 mg | Freq: Once | INTRAVENOUS | Status: DC

## 2014-04-24 MED ORDER — SODIUM CHLORIDE 0.9 % IJ SOLN
10.0000 mL | INTRAMUSCULAR | Status: DC | PRN
Start: 2014-04-24 — End: 2014-04-24

## 2014-04-24 MED ORDER — PROCHLORPERAZINE MALEATE 10 MG PO TABS: ORAL_TABLET | ORAL | Status: DC

## 2014-04-24 MED ORDER — SODIUM CHLORIDE 0.9 % IV SOLN
Freq: Once | INTRAVENOUS | Status: AC
  Administered 2014-04-24: 10:00:00 via INTRAVENOUS

## 2014-04-24 MED ORDER — METOCLOPRAMIDE HCL 5 MG PO TABS: ORAL_TABLET | ORAL | Status: DC

## 2014-04-24 MED ORDER — OXYCODONE HCL 5 MG PO TABS: ORAL_TABLET | ORAL | Status: DC

## 2014-04-24 NOTE — Progress Notes (Signed)
Jeff [name] Tolerated chemotherapy well today.  Discharged ambulatory

## 2014-04-24 NOTE — Patient Instructions (Signed)
[facility] Discharge Instructions for Patients Receiving Chemotherapy  Today you received the following chemotherapy agents taxotere Please call the clinic if you have any questions or concerns  To help prevent nausea and vomiting after your treatment, we encourage you to take your nausea medication  If you develop nausea and vomiting that is not controlled by your nausea medication, call the clinic. If it is after clinic hours your family physician or the after hours number for the clinic or go to the Emergency Department.   BELOW ARE SYMPTOMS THAT SHOULD BE REPORTED IMMEDIATELY:  *FEVER GREATER THAN 101.0 F  *CHILLS WITH OR WITHOUT FEVER  NAUSEA AND VOMITING THAT IS NOT CONTROLLED WITH YOUR NAUSEA MEDICATION  *UNUSUAL SHORTNESS OF BREATH  *UNUSUAL BRUISING OR BLEEDING  TENDERNESS IN MOUTH AND THROAT WITH OR WITHOUT PRESENCE OF ULCERS  *URINARY PROBLEMS  *BOWEL PROBLEMS  UNUSUAL RASH Items with * indicate a potential emergency and should be followed up as soon as possible.  One of the nurses will contact you 24 hours after your treatment. Please let the nurse know about any problems that you may have experienced. Feel free to call the clinic you have any questions or concerns. The clinic phone number is [phone].   I have been informed and understand all the instructions given to me. I know to contact the clinic, my physician, or go to the Emergency Department if any problems should occur. I do not have any questions at this time, but understand that I may call the clinic during office hours or the Patient Navigator at [phone] should I have any questions or need assistance in obtaining follow up care.    Docetaxel injection What is this medicine? DOCETAXEL (doe se TAX el) is a chemotherapy drug. It targets fast dividing cells, like cancer cells, and causes these cells to die. This medicine is used to treat many types of cancers like breast cancer,  certain stomach cancers, head and neck cancer, lung cancer, and prostate cancer. This medicine may be used for other purposes; ask your health care provider or pharmacist if you have questions. COMMON BRAND NAME(S): Docefrez, Taxotere What should I tell my health care provider before I take this medicine? They need to know if you have any of these conditions: -infection (especially a virus infection such as chickenpox, cold sores, or herpes) -liver disease -low blood counts, like low white cell, platelet, or red cell counts -an unusual or allergic reaction to docetaxel, polysorbate 80, other chemotherapy agents, other medicines, foods, dyes, or preservatives -pregnant or trying to get pregnant -breast-feeding How should I use this medicine? This drug is given as an infusion into a vein. It is administered in a hospital or clinic by a specially trained health care professional. Talk to your pediatrician regarding the use of this medicine in children. Special care may be needed. Overdosage: If you think you have taken too much of this medicine contact a poison control center or emergency room at once. NOTE: This medicine is only for you. Do not share this medicine with others. What if I miss a dose? It is important not to miss your dose. Call your doctor or health care professional if you are unable to keep an appointment. What may interact with this medicine? -cyclosporine -erythromycin -ketoconazole -medicines to increase blood counts like filgrastim, pegfilgrastim, sargramostim -vaccines Talk to your doctor or health care professional before taking any of these medicines: -acetaminophen -aspirin -ibuprofen -ketoprofen -naproxen This list may not  describe all possible interactions. Give your health care provider a list of all the medicines, herbs, non-prescription drugs, or dietary supplements you use. Also tell them if you smoke, drink alcohol, or use illegal drugs. Some items may  interact with your medicine. What should I watch for while using this medicine? Your condition will be monitored carefully while you are receiving this medicine. You will need important blood work done while you are taking this medicine. This drug may make you feel generally unwell. This is not uncommon, as chemotherapy can affect healthy cells as well as cancer cells. Report any side effects. Continue your course of treatment even though you feel ill unless your doctor tells you to stop. In some cases, you may be given additional medicines to help with side effects. Follow all directions for their use. Call your doctor or health care professional for advice if you get a fever, chills or sore throat, or other symptoms of a cold or flu. Do not treat yourself. This drug decreases your body's ability to fight infections. Try to avoid being around people who are sick. This medicine may increase your risk to bruise or bleed. Call your doctor or health care professional if you notice any unusual bleeding. Be careful brushing and flossing your teeth or using a toothpick because you may get an infection or bleed more easily. If you have any dental work done, tell your dentist you are receiving this medicine. Avoid taking products that contain aspirin, acetaminophen, ibuprofen, naproxen, or ketoprofen unless instructed by your doctor. These medicines may hide a fever. This medicine contains an alcohol in the product. You may get drowsy or dizzy. Do not drive, use machinery, or do anything that needs mental alertness until you know how this medicine affects you. Do not stand or sit up quickly, especially if you are an older patient. This reduces the risk of dizzy or fainting spells. Avoid alcoholic drinks Do not become pregnant while taking this medicine. Women should inform their doctor if they wish to become pregnant or think they might be pregnant. There is a potential for serious side effects to an unborn child.  Talk to your health care professional or pharmacist for more information. Do not breast-feed an infant while taking this medicine. What side effects may I notice from receiving this medicine? Side effects that you should report to your doctor or health care professional as soon as possible: -allergic reactions like skin rash, itching or hives, swelling of the face, lips, or tongue -low blood counts - This drug may decrease the number of white blood cells, red blood cells and platelets. You may be at increased risk for infections and bleeding. -signs of infection - fever or chills, cough, sore throat, pain or difficulty passing urine -signs of decreased platelets or bleeding - bruising, pinpoint red spots on the skin, black, tarry stools, nosebleeds -signs of decreased red blood cells - unusually weak or tired, fainting spells, lightheadedness -breathing problems -fast or irregular heartbeat -low blood pressure -mouth sores -nausea and vomiting -pain, swelling, redness or irritation at the injection site -pain, tingling, numbness in the hands or feet -swelling of the ankle, feet, hands -weight gain Side effects that usually do not require medical attention (report to your prescriber or health care professional if they continue or are bothersome): -bone pain -complete hair loss including hair on your head, underarms, pubic hair, eyebrows, and eyelashes -diarrhea -excessive tearing -changes in the color of fingernails -loosening of the fingernails -nausea -muscle pain -  red flush to skin -sweating -weak or tired This list may not describe all possible side effects. Call your doctor for medical advice about side effects. You may report side effects to FDA at 1-800-FDA-1088. Where should I keep my medicine? This drug is given in a hospital or clinic and will not be stored at home. NOTE: This sheet is a summary. It may not cover all possible information. If you have questions about this  medicine, talk to your doctor, pharmacist, or health care provider.  2015, [employer]. (2013-03-08 22:21:02)

## 2014-04-24 NOTE — Progress Notes (Signed)
[name], MD [place] [address]  Stage IV Adenocarcinoma of the prostate, Hormone Sensitive Disease Bilateral orchiectomy Taxotere/Xgeva started 02/2014 Bilateral nephrostomy tube placement 01/2014  CURRENT THERAPY:Taxotere/Xgeva  INTERVAL HISTORY: Jeff [name] 68 y.o. male returns for follow-up of his hormone sensitive stage IV prostate cancer. He is on Taxotere. His appetite is good. He denies nausea or vomiting. He says his energy is good. He feels a need to urinate and states he does so daily.  He just had his nephrostomy tubes replaced. He has not problems.  MEDICAL HISTORY: Past Medical History  Diagnosis Date  . Acute renal failure 01/26/2014  . Anemia of chronic disease 01/26/2014  . Bilateral hydronephrosis 01/26/2014  . History of cocaine abuse 01/26/2014  . Homelessness 01/31/2014  . 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      Prostate cancer metastatic to multiple sites   01/26/2014 Tumor Marker PSA > 5000   01/28/2014 Initial Biopsy Metastatic adenocarcinoma of prostate   02/05/2014 Surgery Bilateral orchiectomy by Dr. Junious Silk   02/26/2014 Tumor Marker PSA = 399.5   03/14/2014 Tumor Marker PSA= 89.51   03/14/2014 -  Chemotherapy Docetaxel 75 mg/kg every 21 days     has No Known Allergies.  We administered pegfilgrastim.  SURGICAL HISTORY: Past Surgical History  Procedure Laterality Date  . Orchiectomy Bilateral 02/05/2014    Procedure: BILATERAL ORCHIECTOMY;  Surgeon: Festus Aloe, MD;  Location: [facility];  Service: Urology;  Laterality: Bilateral;  . Portacath placement Right 03/12/14    SOCIAL HISTORY: History   Social History  . Marital Status: Single    Spouse Name: N/A    Number of Children: 1  . Years of Education: 9th   Occupational History  . disabilty    Social History Main Topics  . Smoking status: Current Every Day Smoker -- 0.25 packs/day for 55 years    Types: Cigarettes  . Smokeless tobacco: Never Used  . Alcohol Use: No     Comment: none for at least 7 -8 years  . Drug Use: Yes    Special: Cocaine     Comment: last used Cocaine 01/25/14  . Sexual Activity: Yes   Other Topics Concern  . Not on file   Social History Narrative   Lives at [employer] grade   1 son, lives in [place], [place]   Can read and write in native language             FAMILY HISTORY: Family History  Problem Relation Age of Onset  . Cancer Brother 60    Review of Systems  Constitutional: Negative for fever, chills, weight loss and malaise/fatigue.  HENT: Negative for congestion, hearing loss, nosebleeds, sore throat and tinnitus.   Eyes: Negative for blurred vision, double vision, pain and discharge.  Respiratory: Negative for cough, hemoptysis, sputum production, shortness of breath and wheezing.   Cardiovascular: Negative for chest pain, palpitations, claudication, leg swelling and PND.  Gastrointestinal: Negative for heartburn, nausea, vomiting, abdominal pain, diarrhea, constipation, blood in stool and melena.  Genitourinary: Negative for dysuria, urgency, frequency and hematuria.  Musculoskeletal: Negative for myalgias, joint pain and falls.  Skin: Negative for itching and rash.  Neurological: Negative for dizziness,  tingling, tremors, sensory change, speech change, focal weakness, seizures, loss of consciousness, weakness and headaches.  Endo/Heme/Allergies: Does not bruise/bleed easily.  Psychiatric/Behavioral: Negative for depression, suicidal ideas, memory loss and substance abuse. The patient is not nervous/anxious and does not have insomnia.     PHYSICAL EXAMINATION  ECOG PERFORMANCE STATUS: 0 -  Asymptomatic  Filed Vitals:   04/25/14 1100  BP: 141/82  Pulse: 78  Temp: 97.5 F (36.4 C)  Resp: 16    Physical Exam  Constitutional: He is oriented to person, place, and time and well-developed, well-nourished, and in no distress.  HENT:  Head: Normocephalic and atraumatic.  Nose: Nose normal.  Mouth/Throat: Oropharynx is clear and moist. No oropharyngeal exudate.  Eyes: Conjunctivae and EOM are normal. Pupils are equal, round, and reactive to light. Right eye exhibits no discharge. Left eye exhibits no discharge. No scleral icterus.  Neck: Normal range of motion. Neck supple. No tracheal deviation present. No thyromegaly present.  Cardiovascular: Normal rate, regular rhythm and normal heart sounds.  Exam reveals no gallop and no friction rub.   No murmur heard. Pulmonary/Chest: Effort normal and breath sounds normal. He has no wheezes. He has no rales.  Abdominal: Soft. Bowel sounds are normal. He exhibits no distension and no mass. There is no tenderness. There is no rebound and no guarding.  Genitourinary:  Bilateral Nephrostomy tubes, bandaged, C/D/I  Musculoskeletal: Normal range of motion. He exhibits no edema.  Lymphadenopathy:    He has no cervical adenopathy.  Neurological: He is alert and oriented to person, place, and time. He has normal reflexes. No cranial nerve deficit. Gait normal. Coordination normal.  Skin: Skin is warm and dry. No rash noted.  Psychiatric: Mood, memory, affect and judgment normal.  Nursing note and vitals reviewed.   LABORATORY DATA:  CBC    Component Value Date/Time   WBC 9.6 04/24/2014 0917   RBC 3.25* 04/24/2014 0917   HGB 9.6* 04/24/2014 0917   HCT 29.5* 04/24/2014 0917   PLT 395 04/24/2014 0917   MCV 90.8 04/24/2014 0917   MCH 29.5 04/24/2014 0917   MCHC 32.5 04/24/2014 0917   RDW 17.7* 04/24/2014 0917   LYMPHSABS 1.5 04/24/2014 0917   MONOABS 1.1* 04/24/2014 0917   EOSABS 0.1 04/24/2014 0917   BASOSABS 0.0 04/24/2014 0917     CMP     Component Value Date/Time   NA 135 04/24/2014 0917   K 4.3 04/24/2014 0917   CL 106 04/24/2014 0917   CO2 24 04/24/2014 0917   GLUCOSE 107* 04/24/2014 0917   BUN 22 04/24/2014 0917   CREATININE 1.50* 04/24/2014 0917   CREATININE 1.39* 02/06/2014 1012   CALCIUM 8.0* 04/24/2014 0917   PROT 7.5 04/24/2014 0917   ALBUMIN 3.5 04/24/2014 0917   AST 16 04/24/2014 0917   ALT 17 04/24/2014 0917   ALKPHOS 102 04/24/2014 0917   BILITOT 0.3 04/24/2014 0917   GFRNONAA 46* 04/24/2014 0917   GFRAA 54* 04/24/2014 0917     ASSESSMENT and THERAPY PLAN:    Prostate cancer metastatic to multiple sites Pleasant 28 year ol male with Stage IV prostate cancer on presentation. He has undergone bilateral orchiectomy and is currently on Taxotere with excellent tolerance. PSA continues to improve.  Kidney function is improved, he is hoping for nephrostomy tube removal in the near future.  He denies hot flashes, nausea, vomiting, excessive fatigue or pain.  We will see him back prior to his next cycle of taxotere with labs. He knows to  call in the interim with any problems if needed.  Acute renal failure He has just had his nephrostomy tubes replaced. He is having minimal difficulty with them. Kidney function is stable.   Anemia of chronic disease CBC was reviewed.  Counts are stable.  I will add B12, folate, ferritin and iron studies to his next visit as they have not been checked.    All questions were answered. The patient knows to call the clinic with any problems, questions or concerns. We can certainly see the patient much sooner if necessary. Molli Hazard 04/27/2014

## 2014-04-25 ENCOUNTER — Encounter (HOSPITAL_BASED_OUTPATIENT_CLINIC_OR_DEPARTMENT_OTHER): Payer: PRIVATE HEALTH INSURANCE | Admitting: Hematology & Oncology

## 2014-04-25 ENCOUNTER — Encounter (HOSPITAL_COMMUNITY): Payer: PRIVATE HEALTH INSURANCE

## 2014-04-25 VITALS — BP 141/82 | HR 78 | Temp 97.5°F | Resp 16 | Wt 175.5 lb

## 2014-04-25 DIAGNOSIS — Z5189 Encounter for other specified aftercare: Secondary | ICD-10-CM

## 2014-04-25 DIAGNOSIS — N179 Acute kidney failure, unspecified: Secondary | ICD-10-CM

## 2014-04-25 DIAGNOSIS — C7951 Secondary malignant neoplasm of bone: Secondary | ICD-10-CM

## 2014-04-25 DIAGNOSIS — C61 Malignant neoplasm of prostate: Secondary | ICD-10-CM

## 2014-04-25 DIAGNOSIS — D638 Anemia in other chronic diseases classified elsewhere: Secondary | ICD-10-CM

## 2014-04-25 LAB — PSA: PSA: 19.89 ng/mL — ABNORMAL HIGH (ref <=4.00)

## 2014-04-25 MED ORDER — PEGFILGRASTIM INJECTION 6 MG/0.6ML ~~LOC~~
PREFILLED_SYRINGE | SUBCUTANEOUS | Status: AC
  Filled 2014-04-25: qty 0.6

## 2014-04-25 MED ORDER — PEGFILGRASTIM INJECTION 6 MG/0.6ML ~~LOC~~
6.0000 mg | PREFILLED_SYRINGE | Freq: Once | SUBCUTANEOUS | Status: AC
  Administered 2014-04-25: 6 mg via SUBCUTANEOUS

## 2014-04-25 NOTE — Progress Notes (Signed)
Jeff [name] presents today for injection per MD orders. Neulasta 6mg  administered SQ in left Abdomen. Administration without incident. Patient tolerated well. Denies any complaints post chemo.

## 2014-04-25 NOTE — Progress Notes (Signed)
Please see doctors appt encounter for more information 

## 2014-04-25 NOTE — Patient Instructions (Signed)
[place] Discharge Instructions  RECOMMENDATIONS MADE BY THE CONSULTANT AND ANY TEST RESULTS WILL BE SENT TO YOUR REFERRING PHYSICIAN.  We will see you back on 05/15/2014 If you need anything before follow-up please call us.  Thank you for choosing [facility] to provide your oncology and hematology care.  To afford each patient quality time with our providers, please arrive at least 15 minutes before your scheduled appointment time.  With your help, our goal is to use those 15 minutes to complete the necessary work-up to ensure our physicians have the information they need to help with your evaluation and healthcare recommendations.    Effective January 1st, 2014, we ask that you re-schedule your appointment with our physicians should you arrive 10 or more minutes late for your appointment.  We strive to give you quality time with our providers, and arriving late affects you and other patients whose appointments are after yours.    Again, thank you for choosing [facility].  Our hope is that these requests will decrease the amount of time that you wait before being seen by our physicians.       _____________________________________________________________  Should you have questions after your visit to [facility], please contact our office at [phone] between the hours of 8:30 a.m. and 5:00 p.m.  Voicemails left after 4:30 p.m. will not be returned until the following business day.  For prescription refill requests, have your pharmacy contact our office with your prescription refill request.    [facility] Discharge Instructions for Patients Receiving Chemotherapy  If you develop nausea and vomiting that is not controlled by your nausea medication, call the clinic.   BELOW ARE SYMPTOMS THAT SHOULD BE REPORTED IMMEDIATELY:  *FEVER GREATER THAN 100.5 F  *CHILLS WITH OR WITHOUT FEVER  NAUSEA AND VOMITING THAT  IS NOT CONTROLLED WITH YOUR NAUSEA MEDICATION  *UNUSUAL SHORTNESS OF BREATH  *UNUSUAL BRUISING OR BLEEDING  TENDERNESS IN MOUTH AND THROAT WITH OR WITHOUT PRESENCE OF ULCERS  *URINARY PROBLEMS  *BOWEL PROBLEMS  UNUSUAL RASH Items with * indicate a potential emergency and should be followed up as soon as possible.  Feel free to call the clinic you have any questions or concerns. The clinic phone number is [phone].

## 2014-04-27 ENCOUNTER — Encounter (HOSPITAL_COMMUNITY): Payer: Self-pay | Admitting: Hematology & Oncology

## 2014-04-27 NOTE — Assessment & Plan Note (Signed)
CBC was reviewed.  Counts are stable.  I will add B12, folate, ferritin and iron studies to his next visit as they have not been checked.

## 2014-04-27 NOTE — Assessment & Plan Note (Addendum)
Pleasant 24 year ol male with Stage IV prostate cancer on presentation. He has undergone bilateral orchiectomy and is currently on Taxotere with excellent tolerance. PSA continues to improve.  Kidney function is improved, he is hoping for nephrostomy tube removal in the near future.  He denies hot flashes, nausea, vomiting, excessive fatigue or pain.  We will see him back prior to his next cycle of taxotere with labs. He knows to call in the interim with any problems if needed.

## 2014-04-27 NOTE — Assessment & Plan Note (Signed)
He has just had his nephrostomy tubes replaced. He is having minimal difficulty with them. Kidney function is stable.

## 2014-04-30 ENCOUNTER — Other Ambulatory Visit (HOSPITAL_COMMUNITY): Payer: Self-pay

## 2014-04-30 DIAGNOSIS — C61 Malignant neoplasm of prostate: Secondary | ICD-10-CM

## 2014-04-30 DIAGNOSIS — M899 Disorder of bone, unspecified: Secondary | ICD-10-CM

## 2014-05-01 ENCOUNTER — Encounter (HOSPITAL_COMMUNITY): Payer: Medicare Other | Attending: Hematology and Oncology

## 2014-05-01 ENCOUNTER — Encounter (HOSPITAL_BASED_OUTPATIENT_CLINIC_OR_DEPARTMENT_OTHER): Payer: Medicare Other

## 2014-05-01 DIAGNOSIS — J449 Chronic obstructive pulmonary disease, unspecified: Secondary | ICD-10-CM | POA: Diagnosis not present

## 2014-05-01 DIAGNOSIS — M899 Disorder of bone, unspecified: Secondary | ICD-10-CM

## 2014-05-01 DIAGNOSIS — Z87898 Personal history of other specified conditions: Secondary | ICD-10-CM | POA: Diagnosis not present

## 2014-05-01 DIAGNOSIS — C7951 Secondary malignant neoplasm of bone: Secondary | ICD-10-CM | POA: Insufficient documentation

## 2014-05-01 DIAGNOSIS — C61 Malignant neoplasm of prostate: Secondary | ICD-10-CM | POA: Insufficient documentation

## 2014-05-01 DIAGNOSIS — C779 Secondary and unspecified malignant neoplasm of lymph node, unspecified: Secondary | ICD-10-CM | POA: Diagnosis not present

## 2014-05-01 LAB — COMPREHENSIVE METABOLIC PANEL
ALBUMIN: 3.3 g/dL — AB (ref 3.5–5.2)
ALT: 21 U/L (ref 0–53)
AST: 17 U/L (ref 0–37)
Alkaline Phosphatase: 151 U/L — ABNORMAL HIGH (ref 39–117)
Anion gap: 6 (ref 5–15)
BUN: 25 mg/dL — AB (ref 6–23)
CALCIUM: 8.5 mg/dL (ref 8.4–10.5)
CO2: 25 mmol/L (ref 19–32)
CREATININE: 1.41 mg/dL — AB (ref 0.50–1.35)
Chloride: 106 mEq/L (ref 96–112)
GFR calc non Af Amer: 50 mL/min — ABNORMAL LOW (ref >90)
GFR, EST AFRICAN AMERICAN: 58 mL/min — AB (ref >90)
GLUCOSE: 122 mg/dL — AB (ref 70–99)
Potassium: 4.6 mmol/L (ref 3.5–5.1)
Sodium: 137 mmol/L (ref 135–145)
Total Bilirubin: 0.3 mg/dL (ref 0.3–1.2)
Total Protein: 7 g/dL (ref 6.0–8.3)

## 2014-05-01 LAB — IRON AND TIBC
Iron: 43 ug/dL (ref 42–165)
Saturation Ratios: 22 % (ref 20–55)
TIBC: 194 ug/dL — ABNORMAL LOW (ref 215–435)
UIBC: 151 ug/dL (ref 125–400)

## 2014-05-01 LAB — FERRITIN: FERRITIN: 998 ng/mL — AB (ref 22–322)

## 2014-05-01 LAB — VITAMIN B12: VITAMIN B 12: 1263 pg/mL — AB (ref 211–911)

## 2014-05-01 MED ORDER — DENOSUMAB 120 MG/1.7ML ~~LOC~~ SOLN
120.0000 mg | Freq: Once | SUBCUTANEOUS | Status: AC
Start: 2014-05-01 — End: 2014-05-01
  Administered 2014-05-01: 120 mg via SUBCUTANEOUS
  Filled 2014-05-01: qty 1.7

## 2014-05-01 NOTE — Progress Notes (Signed)
LABS DRAWN BY RN FOR B12,CMP,FERR,IRON/TIBC

## 2014-05-01 NOTE — Progress Notes (Signed)
Jeff [name] presented for labwork. Labs per MD order drawn via Peripheral Line 23 gauge needle inserted in left antecubital.  Good blood return present. Procedure without incident.  Needle removed intact. Patient tolerated procedure well.  Jeff [name] presents today for injection per MD orders. Xgeva 120mg   administered SQ in left Abdomen. Administration without incident. Patient tolerated well.

## 2014-05-01 NOTE — Patient Instructions (Signed)
[place] Discharge Instructions  RECOMMENDATIONS MADE BY THE CONSULTANT AND ANY TEST RESULTS WILL BE SENT TO YOUR REFERRING PHYSICIAN.  MEDICATIONS PRESCRIBED:  Xgeva injection today.  INSTRUCTIONS/FOLLOW-UP: Continue chemotherapy as scheduled and Neulasta and Xgeva injections as scheduled.  Thank you for choosing [facility] to provide your oncology and hematology care.  To afford each patient quality time with our providers, please arrive at least 15 minutes before your scheduled appointment time.  With your help, our goal is to use those 15 minutes to complete the necessary work-up to ensure our physicians have the information they need to help with your evaluation and healthcare recommendations.    Effective January 1st, 2014, we ask that you re-schedule your appointment with our physicians should you arrive 10 or more minutes late for your appointment.  We strive to give you quality time with our providers, and arriving late affects you and other patients whose appointments are after yours.    Again, thank you for choosing [facility].  Our hope is that these requests will decrease the amount of time that you wait before being seen by our physicians.       _____________________________________________________________  Should you have questions after your visit to [facility], please contact our office at [phone] between the hours of 8:30 a.m. and 4:30 p.m.  Voicemails left after 4:30 p.m. will not be returned until the following business day.  For prescription refill requests, have your pharmacy contact our office with your prescription refill request.    _______________________________________________________________  We hope that we have given you very good care.  You may receive a patient satisfaction survey in the mail, please complete it and return it as soon as possible.  We value your  feedback!  _______________________________________________________________  Have you asked about our STAR program?  STAR stands for Survivorship Training and Rehabilitation, and this is a nationally recognized cancer care program that focuses on survivorship and rehabilitation.  Cancer and cancer treatments may cause problems, such as, pain, making you feel tired and keeping you from doing the things that you need or want to do. Cancer rehabilitation can help. Our goal is to reduce these troubling effects and help you have the best quality of life possible.  You may receive a survey from a nurse that asks questions about your current state of health.  Based on the survey results, all eligible patients will be referred to the [facility] program for an evaluation so we can better serve you!  A frequently asked questions sheet is available upon request.

## 2014-05-02 ENCOUNTER — Other Ambulatory Visit (HOSPITAL_COMMUNITY): Payer: Self-pay | Admitting: Oncology

## 2014-05-02 DIAGNOSIS — M899 Disorder of bone, unspecified: Secondary | ICD-10-CM

## 2014-05-02 DIAGNOSIS — C61 Malignant neoplasm of prostate: Secondary | ICD-10-CM

## 2014-05-02 MED ORDER — OXYCODONE HCL 5 MG PO TABS: ORAL_TABLET | ORAL | Status: DC

## 2014-05-12 NOTE — Assessment & Plan Note (Addendum)
Pleasant 32 year ol male with Stage IV prostate cancer on presentation. He has undergone bilateral orchiectomy and is currently on Taxotere with excellent tolerance. PSA continues to improve.  Kidney function is improved, he is hoping for nephrostomy tube removal in the near future.  He denies nausea, vomiting, excessive fatigue or pain.  He notes that Tylenol usually is effective for pain control, but he also uses Oxycodone as needed for pain.  A refill on this pain medication is provided today.  He does note hot flashes that occur for about 5-10 minutes 2-3 times per week.  They do not interfere with QOL at this time and are relatively infrequent.  He did not know what they were so I provided him education about this symptom and its cause, in his cause orchiectomy.  There are anecdotal interventions that are available to him, but with the infrequency of this symptom, I have opted not to treat to help him financially.  Pre-chemo labs as ordered.  Return in 3 weeks for follow-up.

## 2014-05-12 NOTE — Progress Notes (Signed)
[name], MD [place] [address]  Prostate cancer metastatic to multiple sites - Plan: CBC with Differential, Comprehensive metabolic panel, PSA, CBC with Differential, Comprehensive metabolic panel, PSA, Testosterone, Testosterone, free, Testosterone, % free  CURRENT THERAPY: S/P 3 cycles of Taxotere and monthly Xgeva after undergoing orchiectomy.  INTERVAL HISTORY: Jeff [name] 69 y.o. male returns for followup of hormone sensitive stage IV prostate cancer.     Prostate cancer metastatic to multiple sites   01/26/2014 Tumor Marker PSA > 5000   01/28/2014 Initial Biopsy Metastatic adenocarcinoma of prostate   02/05/2014 Surgery Bilateral orchiectomy by Dr. Junious Silk   02/26/2014 Tumor Marker PSA = 399.5   03/14/2014 Tumor Marker PSA= 89.51   03/14/2014 -  Chemotherapy Docetaxel 75 mg/kg every 21 days   04/24/2014 Tumor Marker PSA- 19.89   I personally reviewed and went over laboratory results with the patient.  The results are noted within this dictation.  He continues to tolerate therapy well. He notes infrequent hot flashes and he is provided information regarding this.  He notes that it does not interefer with his QOL.  He just didn't know what that sensation was from. His pain is well controlled and he notes that often time, Tylenol is effective.  Oncologically, he denies any other complaints and ROS questioning is negative.  Past Medical History  Diagnosis Date  . Acute renal failure 01/26/2014  . Anemia of chronic disease 01/26/2014  . Bilateral hydronephrosis 01/26/2014  . History of cocaine abuse 01/26/2014  . Homelessness 01/31/2014  . 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      has No Known Allergies.  [name] does not currently have medications on file.  Past Surgical History  Procedure Laterality Date  . Orchiectomy Bilateral 02/05/2014    Procedure: BILATERAL ORCHIECTOMY;  Surgeon: Festus Aloe, MD;  Location: [facility];  Service: Urology;  Laterality: Bilateral;  . Portacath placement Right 03/12/14    Denies any headaches, dizziness, double vision, fevers, chills, night sweats, nausea, vomiting, diarrhea, constipation, chest pain, heart palpitations, shortness of breath, blood in stool, black tarry stool, urinary pain, urinary burning, urinary frequency, hematuria.   PHYSICAL EXAMINATION  ECOG PERFORMANCE STATUS: 0 - Asymptomatic  There were no vitals filed for this visit.  GENERAL:alert, no distress, well nourished, well developed, comfortable, cooperative and smiling SKIN: skin color, texture, turgor are normal, no rashes or significant lesions HEAD: Normocephalic, No masses, lesions, tenderness or abnormalities EYES: normal, PERRLA, EOMI, Conjunctiva are pink and non-injected EARS: External ears normal OROPHARYNX:mucous membranes are moist  NECK: supple, no adenopathy, thyroid normal size, non-tender, without nodularity, trachea midline LYMPH:  no palpable lymphadenopathy, no hepatosplenomegaly BREAST:not examined LUNGS: clear to auscultation  HEART: regular rate & rhythm ABDOMEN:abdomen soft and normal bowel sounds BACK: Back symmetric, no curvature. EXTREMITIES:less then 2 second capillary refill, no joint deformities, effusion, or inflammation, no skin discoloration, no cyanosis  NEURO: alert & oriented x 3 with fluent speech, no focal motor/sensory deficits, gait normal   LABORATORY DATA: CBC    Component Value Date/Time   WBC 9.6 04/24/2014 0917   RBC 3.25* 04/24/2014 0917   HGB 9.6* 04/24/2014 0917   HCT 29.5* 04/24/2014 0917   PLT 395 04/24/2014  0917   MCV 90.8 04/24/2014 0917   MCH 29.5 04/24/2014 0917   MCHC 32.5  04/24/2014 0917   RDW 17.7* 04/24/2014 0917   LYMPHSABS 1.5 04/24/2014 0917   MONOABS 1.1* 04/24/2014 0917   EOSABS 0.1 04/24/2014 0917   BASOSABS 0.0 04/24/2014 0917      Chemistry      Component Value Date/Time   NA 137 05/01/2014 1130   K 4.6 05/01/2014 1130   CL 106 05/01/2014 1130   CO2 25 05/01/2014 1130   BUN 25* 05/01/2014 1130   CREATININE 1.41* 05/01/2014 1130   CREATININE 1.39* 02/06/2014 1012      Component Value Date/Time   CALCIUM 8.5 05/01/2014 1130   ALKPHOS 151* 05/01/2014 1130   AST 17 05/01/2014 1130   ALT 21 05/01/2014 1130   BILITOT 0.3 05/01/2014 1130      Lab Results  Component Value Date   PSA 19.89* 04/24/2014   PSA 31.39* 04/04/2014   PSA 89.51* 03/14/2014     RADIOGRAPHIC STUDIES:  Ir Nephrostomy Tube Change  04/22/2014   INDICATION: Patient with history of prostate cancer, post ultrasound fluoroscopic guided bilateral percutaneous nephrostomy catheter placement on 01/28/2014. The plan is to complete chemotherapy prior to attempted internalization of the bilateral percutaneous nephrostomy catheters. As such, patient returns today for routine bilateral percutaneous nephrostomy catheter exchange.  EXAM: FLUOROSCOPIC GUIDED BILATERAL SIDED NEPHROSTOMY CATHETER EXCHANGE  COMPARISON:  Ultrasound fluoroscopic guided per bilateral percutaneous nephrostomy catheter placement - 01/28/2014; bilateral percutaneous nephrostomy catheter exchange - 03/12/2014  CONTRAST:  A total of 20 mL Isovue-300 administered was administered into both collecting systems  FLUOROSCOPY TIME:  5 minutes 36 seconds (761 mGy)  COMPLICATIONS: None immediate  TECHNIQUE: Informed written consent was obtained from the patient after a discussion of the risks, benefits and alternatives to treatment. Questions regarding the procedure were encouraged and answered. A timeout was performed prior to the initiation of the procedure.  The bilateral flanks and external portions of existing  nephrostomy catheters were prepped and draped in the usual sterile fashion. A sterile drape was applied covering the operative field. Maximum barrier sterile technique with sterile gowns and gloves were used for the procedure. A timeout was performed prior to the initiation of the procedure.  A pre procedural spot fluoroscopic image was obtained.  Beginning with the left-sided nephrostomy, a small amount of contrast was injected via the existing left-sided nephrostomy catheter demonstrating appropriate positioning within the renal pelvis. The existing nephrostomy catheter was cut and cannulated with a Benson wire which was coiled within the renal pelvis. Under intermittent fluoroscopic guidance, the existing nephrostomy catheter was exchanged for a new 10.2 [place] all-purpose drainage catheter. Limited contrast injection confirmed appropriate positioning within the left renal pelvis and a post exchange fluoroscopic image was obtained. The catheter was locked , secured to the skin with an interrupted suture and reconnected to a gravity bag.  The identical repeat procedure was repeated for the contralateral right-sided nephrostomy, ultimately allowing successful exchange of a new 10.2 [place] all-purpose drainage catheter with end coiled and locked within the right renal pelvis. Note, there was some difficulty and coiling the right-sided percutaneous proximally catheter within the diminutive right-sided renal pelvis.  Dressings were placed. The patient tolerated the above procedures well without immediate postprocedural complication.  FINDINGS: The existing nephrostomy catheters are appropriately positioned and functioning.  After successful fluoroscopic guided exchange, new bilateral 10.2 French nephrostomy catheters are coiled and locked within the respective renal pelvises.  IMPRESSION: Successful fluoroscopic guided  exchange of bilateral 10.2 French percutaneous nephrostomy catheters.  PLAN: Would consider  converting her right-sided percutaneous nephrostomy catheter to a Bettey Mare catheter at the time of the patient's next routine exchange given the diminutive size of the right renal pelvis.  As above, patient is to undergo attempted internalization of the bilateral percutaneous nephrostomy catheters following the completion of his chemotherapy.   Electronically Signed   By: Sandi Mariscal M.D.   On: 04/22/2014 12:07   Ir Nephrostomy Tube Change  04/22/2014   INDICATION: Patient with history of prostate cancer, post ultrasound fluoroscopic guided bilateral percutaneous nephrostomy catheter placement on 01/28/2014. The plan is to complete chemotherapy prior to attempted internalization of the bilateral percutaneous nephrostomy catheters. As such, patient returns today for routine bilateral percutaneous nephrostomy catheter exchange.  EXAM: FLUOROSCOPIC GUIDED BILATERAL SIDED NEPHROSTOMY CATHETER EXCHANGE  COMPARISON:  Ultrasound fluoroscopic guided per bilateral percutaneous nephrostomy catheter placement - 01/28/2014; bilateral percutaneous nephrostomy catheter exchange - 03/12/2014  CONTRAST:  A total of 20 mL Isovue-300 administered was administered into both collecting systems  FLUOROSCOPY TIME:  5 minutes 36 seconds (161 mGy)  COMPLICATIONS: None immediate  TECHNIQUE: Informed written consent was obtained from the patient after a discussion of the risks, benefits and alternatives to treatment. Questions regarding the procedure were encouraged and answered. A timeout was performed prior to the initiation of the procedure.  The bilateral flanks and external portions of existing nephrostomy catheters were prepped and draped in the usual sterile fashion. A sterile drape was applied covering the operative field. Maximum barrier sterile technique with sterile gowns and gloves were used for the procedure. A timeout was performed prior to the initiation of the procedure.  A pre procedural spot fluoroscopic image was  obtained.  Beginning with the left-sided nephrostomy, a small amount of contrast was injected via the existing left-sided nephrostomy catheter demonstrating appropriate positioning within the renal pelvis. The existing nephrostomy catheter was cut and cannulated with a Benson wire which was coiled within the renal pelvis. Under intermittent fluoroscopic guidance, the existing nephrostomy catheter was exchanged for a new 10.2 [place] all-purpose drainage catheter. Limited contrast injection confirmed appropriate positioning within the left renal pelvis and a post exchange fluoroscopic image was obtained. The catheter was locked , secured to the skin with an interrupted suture and reconnected to a gravity bag.  The identical repeat procedure was repeated for the contralateral right-sided nephrostomy, ultimately allowing successful exchange of a new 10.2 [place] all-purpose drainage catheter with end coiled and locked within the right renal pelvis. Note, there was some difficulty and coiling the right-sided percutaneous proximally catheter within the diminutive right-sided renal pelvis.  Dressings were placed. The patient tolerated the above procedures well without immediate postprocedural complication.  FINDINGS: The existing nephrostomy catheters are appropriately positioned and functioning.  After successful fluoroscopic guided exchange, new bilateral 10.2 French nephrostomy catheters are coiled and locked within the respective renal pelvises.  IMPRESSION: Successful fluoroscopic guided exchange of bilateral 10.2 French percutaneous nephrostomy catheters.  PLAN: Would consider converting her right-sided percutaneous nephrostomy catheter to a Bettey Mare catheter at the time of the patient's next routine exchange given the diminutive size of the right renal pelvis.  As above, patient is to undergo attempted internalization of the bilateral percutaneous nephrostomy catheters following the completion of his  chemotherapy.   Electronically Signed   By: Sandi Mariscal M.D.   On: 04/22/2014 12:07     ASSESSMENT AND PLAN:  Prostate cancer metastatic to multiple sites Pleasant 20 year  ol male with Stage IV prostate cancer on presentation. He has undergone bilateral orchiectomy and is currently on Taxotere with excellent tolerance. PSA continues to improve.  Kidney function is improved, he is hoping for nephrostomy tube removal in the near future.  He denies nausea, vomiting, excessive fatigue or pain.  He notes that Tylenol usually is effective for pain control, but he also uses Oxycodone as needed for pain.  A refill on this pain medication is provided today.  He does note hot flashes that occur for about 5-10 minutes 2-3 times per week.  They do not interfere with QOL at this time and are relatively infrequent.  He did not know what they were so I provided him education about this symptom and its cause, in his cause orchiectomy.  There are anecdotal interventions that are available to him, but with the infrequency of this symptom, I have opted not to treat to help him financially.  Pre-chemo labs as ordered.  Return in 3 weeks for follow-up.     THERAPY PLAN:  We will continue with therapy as planned with plans of transitioning to maintenance therapy in the future.  All questions were answered. The patient knows to call the clinic with any problems, questions or concerns. We can certainly see the patient much sooner if necessary.  Patient and plan discussed with Dr. Ancil Linsey and she is in agreement with the aforementioned.   KEFALAS,THOMAS 05/15/2014

## 2014-05-14 ENCOUNTER — Telehealth (HOSPITAL_COMMUNITY): Payer: Self-pay | Admitting: *Deleted

## 2014-05-14 ENCOUNTER — Other Ambulatory Visit (HOSPITAL_COMMUNITY): Payer: Self-pay | Admitting: Oncology

## 2014-05-14 DIAGNOSIS — M899 Disorder of bone, unspecified: Secondary | ICD-10-CM

## 2014-05-14 DIAGNOSIS — C61 Malignant neoplasm of prostate: Secondary | ICD-10-CM

## 2014-05-14 MED ORDER — OXYCODONE HCL 5 MG PO TABS: ORAL_TABLET | ORAL | Status: DC

## 2014-05-14 NOTE — Telephone Encounter (Signed)
Ready for pick up

## 2014-05-14 NOTE — Telephone Encounter (Signed)
Patient notified that prescription for oxycodone is ready for pick-up.

## 2014-05-15 ENCOUNTER — Encounter (HOSPITAL_BASED_OUTPATIENT_CLINIC_OR_DEPARTMENT_OTHER): Payer: Medicare Other | Admitting: Oncology

## 2014-05-15 ENCOUNTER — Encounter (HOSPITAL_COMMUNITY): Payer: Self-pay

## 2014-05-15 ENCOUNTER — Encounter (HOSPITAL_BASED_OUTPATIENT_CLINIC_OR_DEPARTMENT_OTHER): Payer: Medicare Other

## 2014-05-15 DIAGNOSIS — C779 Secondary and unspecified malignant neoplasm of lymph node, unspecified: Secondary | ICD-10-CM | POA: Diagnosis not present

## 2014-05-15 DIAGNOSIS — Z87898 Personal history of other specified conditions: Secondary | ICD-10-CM | POA: Diagnosis not present

## 2014-05-15 DIAGNOSIS — C61 Malignant neoplasm of prostate: Secondary | ICD-10-CM

## 2014-05-15 DIAGNOSIS — C7951 Secondary malignant neoplasm of bone: Secondary | ICD-10-CM

## 2014-05-15 DIAGNOSIS — Z5111 Encounter for antineoplastic chemotherapy: Secondary | ICD-10-CM

## 2014-05-15 DIAGNOSIS — J449 Chronic obstructive pulmonary disease, unspecified: Secondary | ICD-10-CM | POA: Diagnosis not present

## 2014-05-15 LAB — CBC WITH DIFFERENTIAL/PLATELET
BASOS ABS: 0 10*3/uL (ref 0.0–0.1)
Basophils Relative: 0 % (ref 0–1)
Eosinophils Absolute: 0 10*3/uL (ref 0.0–0.7)
Eosinophils Relative: 0 % (ref 0–5)
HCT: 28.3 % — ABNORMAL LOW (ref 39.0–52.0)
Hemoglobin: 9.4 g/dL — ABNORMAL LOW (ref 13.0–17.0)
Lymphocytes Relative: 5 % — ABNORMAL LOW (ref 12–46)
Lymphs Abs: 0.5 10*3/uL — ABNORMAL LOW (ref 0.7–4.0)
MCH: 30.1 pg (ref 26.0–34.0)
MCHC: 33.2 g/dL (ref 30.0–36.0)
MCV: 90.7 fL (ref 78.0–100.0)
Monocytes Absolute: 0.7 10*3/uL (ref 0.1–1.0)
Monocytes Relative: 7 % (ref 3–12)
NEUTROS ABS: 8.6 10*3/uL — AB (ref 1.7–7.7)
Neutrophils Relative %: 88 % — ABNORMAL HIGH (ref 43–77)
PLATELETS: 472 10*3/uL — AB (ref 150–400)
RBC: 3.12 MIL/uL — ABNORMAL LOW (ref 4.22–5.81)
RDW: 17.7 % — AB (ref 11.5–15.5)
WBC: 9.8 10*3/uL (ref 4.0–10.5)

## 2014-05-15 LAB — COMPREHENSIVE METABOLIC PANEL
ALT: 20 U/L (ref 0–53)
ANION GAP: 9 (ref 5–15)
AST: 19 U/L (ref 0–37)
Albumin: 3.4 g/dL — ABNORMAL LOW (ref 3.5–5.2)
Alkaline Phosphatase: 80 U/L (ref 39–117)
BUN: 28 mg/dL — ABNORMAL HIGH (ref 6–23)
CO2: 21 mmol/L (ref 19–32)
Calcium: 8.9 mg/dL (ref 8.4–10.5)
Chloride: 102 mEq/L (ref 96–112)
Creatinine, Ser: 1.48 mg/dL — ABNORMAL HIGH (ref 0.50–1.35)
GFR calc Af Amer: 55 mL/min — ABNORMAL LOW (ref >90)
GFR, EST NON AFRICAN AMERICAN: 47 mL/min — AB (ref >90)
Glucose, Bld: 123 mg/dL — ABNORMAL HIGH (ref 70–99)
POTASSIUM: 4 mmol/L (ref 3.5–5.1)
Sodium: 132 mmol/L — ABNORMAL LOW (ref 135–145)
Total Bilirubin: 0.3 mg/dL (ref 0.3–1.2)
Total Protein: 7.8 g/dL (ref 6.0–8.3)

## 2014-05-15 MED ORDER — SODIUM CHLORIDE 0.9 % IJ SOLN: 10.0000 mL | INTRAMUSCULAR | Status: DC | PRN

## 2014-05-15 MED ORDER — SODIUM CHLORIDE 0.9 % IV SOLN
Freq: Once | INTRAVENOUS | Status: AC
  Administered 2014-05-15: 11:00:00 via INTRAVENOUS

## 2014-05-15 MED ORDER — SODIUM CHLORIDE 0.9 % IV SOLN: 8.0000 mg | Freq: Once | INTRAVENOUS | Status: DC

## 2014-05-15 MED ORDER — ONDANSETRON HCL 40 MG/20ML IJ SOLN
Freq: Once | INTRAMUSCULAR | Status: AC
  Administered 2014-05-15: 8 mg via INTRAVENOUS
  Filled 2014-05-15: qty 4

## 2014-05-15 MED ORDER — DEXAMETHASONE SODIUM PHOSPHATE 10 MG/ML IJ SOLN: 10.0000 mg | Freq: Once | INTRAMUSCULAR | Status: DC

## 2014-05-15 MED ORDER — DOCETAXEL CHEMO INJECTION 160 MG/16ML
75.0000 mg/m2 | Freq: Once | INTRAVENOUS | Status: AC
  Administered 2014-05-15: 140 mg via INTRAVENOUS
  Filled 2014-05-15: qty 14

## 2014-05-15 MED ORDER — HEPARIN SOD (PORK) LOCK FLUSH 100 UNIT/ML IV SOLN
500.0000 [IU] | Freq: Once | INTRAVENOUS | Status: AC | PRN
  Administered 2014-05-15: 500 [IU]

## 2014-05-15 NOTE — Patient Instructions (Signed)
[place] at [facility]  Discharge Instructions:  Chemotherapy today as planned.  Refill on pain medication provided today. Return in 3 weeks for follow-up appointment and next treatment. _______________________________________________________________  Thank you for choosing [place] at [facility] to provide your oncology and hematology care.  To afford each patient quality time with our providers, please arrive at least 15 minutes before your scheduled appointment.  You need to re-schedule your appointment if you arrive 10 or more minutes late.  We strive to give you quality time with our providers, and arriving late affects you and other patients whose appointments are after yours.  Also, if you no show three or more times for appointments you may be dismissed from the clinic.  Again, thank you for choosing [place] at [place] hope is that these requests will allow you access to exceptional care and in a timely manner. _______________________________________________________________  If you have questions after your visit, please contact our office at [phone] between the hours of 8:30 a.m. and 5:00 p.m. Voicemails left after 4:30 p.m. will not be returned until the following business day. _______________________________________________________________  For prescription refill requests, have your pharmacy contact our office. _______________________________________________________________  Recommendations made by the consultant and any test results will be sent to your referring physician. _______________________________________________________________

## 2014-05-15 NOTE — Progress Notes (Signed)
Patient tolerated chemotherapy well.  

## 2014-05-16 ENCOUNTER — Encounter (HOSPITAL_BASED_OUTPATIENT_CLINIC_OR_DEPARTMENT_OTHER): Payer: Medicare Other

## 2014-05-16 DIAGNOSIS — Z5189 Encounter for other specified aftercare: Secondary | ICD-10-CM

## 2014-05-16 DIAGNOSIS — C61 Malignant neoplasm of prostate: Secondary | ICD-10-CM

## 2014-05-16 DIAGNOSIS — C7951 Secondary malignant neoplasm of bone: Secondary | ICD-10-CM

## 2014-05-16 LAB — PSA: PSA: 14.4 ng/mL — AB (ref <=4.00)

## 2014-05-16 MED ORDER — PEGFILGRASTIM INJECTION 6 MG/0.6ML ~~LOC~~
PREFILLED_SYRINGE | SUBCUTANEOUS | Status: AC
  Filled 2014-05-16: qty 0.6

## 2014-05-16 MED ORDER — PEGFILGRASTIM INJECTION 6 MG/0.6ML ~~LOC~~
6.0000 mg | PREFILLED_SYRINGE | Freq: Once | SUBCUTANEOUS | Status: AC
  Administered 2014-05-16: 6 mg via SUBCUTANEOUS

## 2014-05-16 NOTE — Patient Instructions (Signed)
[place] [facility] Discharge Instructions  RECOMMENDATIONS MADE BY THE CONSULTANT AND ANY TEST RESULTS WILL BE SENT TO YOUR REFERRING PHYSICIAN. Today you received neulasta injection. Follow up as scheduled.    Thank you for choosing [place] at [facility] to provide your oncology and hematology care.  To afford each patient quality time with our provider, please arrive at least 15 minutes before your scheduled appointment time.    You need to re-schedule your appointment should you arrive 10 or more minutes late.  We strive to give you quality time with our providers, and arriving late affects you and other patients whose appointments are after yours.  Also, if you no show three or more times for appointments you may be dismissed from the clinic at the providers discretion.     Again, thank you for choosing [facility].  Our hope is that these requests will decrease the amount of time that you wait before being seen by our physicians.       _____________________________________________________________  Should you have questions after your visit to [facility], please contact our office at [phone] between the hours of 8:30 a.m. and 4:30 p.m.  Voicemails left after 4:30 p.m. will not be returned until the following business day.  For prescription refill requests, have your pharmacy contact our office.

## 2014-05-16 NOTE — Progress Notes (Signed)
Jeff [name] presents today for injection per the provider's orders.  Neulasta administration without incident; see MAR for injection details.  Patient tolerated procedure well and without incident.  No questions or complaints noted at this time.

## 2014-05-23 ENCOUNTER — Other Ambulatory Visit (HOSPITAL_COMMUNITY): Payer: Self-pay | Admitting: Oncology

## 2014-05-23 ENCOUNTER — Inpatient Hospital Stay (HOSPITAL_COMMUNITY)
Admission: EM | Admit: 2014-05-23 | Discharge: 2014-05-26 | DRG: 872 | Disposition: A | Discharge status: Nursing Home (Includes ICF) | Payer: Medicare Other | Attending: Internal Medicine | Admitting: Internal Medicine

## 2014-05-23 ENCOUNTER — Encounter (HOSPITAL_COMMUNITY): Payer: Self-pay | Admitting: *Deleted

## 2014-05-23 DIAGNOSIS — F17210 Nicotine dependence, cigarettes, uncomplicated: Secondary | ICD-10-CM | POA: Diagnosis present

## 2014-05-23 DIAGNOSIS — C799 Secondary malignant neoplasm of unspecified site: Secondary | ICD-10-CM | POA: Diagnosis present

## 2014-05-23 DIAGNOSIS — N390 Urinary tract infection, site not specified: Secondary | ICD-10-CM | POA: Diagnosis not present

## 2014-05-23 DIAGNOSIS — B377 Candidal sepsis: Secondary | ICD-10-CM | POA: Diagnosis not present

## 2014-05-23 DIAGNOSIS — M899 Disorder of bone, unspecified: Secondary | ICD-10-CM

## 2014-05-23 DIAGNOSIS — R000 Tachycardia, unspecified: Secondary | ICD-10-CM | POA: Diagnosis not present

## 2014-05-23 DIAGNOSIS — E871 Hypo-osmolality and hyponatremia: Secondary | ICD-10-CM | POA: Diagnosis present

## 2014-05-23 DIAGNOSIS — Z9221 Personal history of antineoplastic chemotherapy: Secondary | ICD-10-CM | POA: Diagnosis not present

## 2014-05-23 DIAGNOSIS — N1339 Other hydronephrosis: Secondary | ICD-10-CM

## 2014-05-23 DIAGNOSIS — A4151 Sepsis due to Escherichia coli [E. coli]: Principal | ICD-10-CM | POA: Diagnosis present

## 2014-05-23 DIAGNOSIS — R7881 Bacteremia: Secondary | ICD-10-CM | POA: Diagnosis not present

## 2014-05-23 DIAGNOSIS — E440 Moderate protein-calorie malnutrition: Secondary | ICD-10-CM | POA: Diagnosis not present

## 2014-05-23 DIAGNOSIS — C61 Malignant neoplasm of prostate: Secondary | ICD-10-CM | POA: Diagnosis present

## 2014-05-23 DIAGNOSIS — T83028A Displacement of other indwelling urethral catheter, initial encounter: Secondary | ICD-10-CM | POA: Diagnosis not present

## 2014-05-23 DIAGNOSIS — Z809 Family history of malignant neoplasm, unspecified: Secondary | ICD-10-CM

## 2014-05-23 DIAGNOSIS — Z79891 Long term (current) use of opiate analgesic: Secondary | ICD-10-CM | POA: Diagnosis not present

## 2014-05-23 DIAGNOSIS — N179 Acute kidney failure, unspecified: Secondary | ICD-10-CM | POA: Diagnosis present

## 2014-05-23 DIAGNOSIS — N189 Chronic kidney disease, unspecified: Secondary | ICD-10-CM | POA: Diagnosis not present

## 2014-05-23 DIAGNOSIS — E860 Dehydration: Secondary | ICD-10-CM | POA: Diagnosis not present

## 2014-05-23 DIAGNOSIS — Z9079 Acquired absence of other genital organ(s): Secondary | ICD-10-CM | POA: Diagnosis not present

## 2014-05-23 DIAGNOSIS — A419 Sepsis, unspecified organism: Secondary | ICD-10-CM

## 2014-05-23 DIAGNOSIS — N1330 Unspecified hydronephrosis: Secondary | ICD-10-CM | POA: Diagnosis present

## 2014-05-23 DIAGNOSIS — R509 Fever, unspecified: Secondary | ICD-10-CM

## 2014-05-23 DIAGNOSIS — D638 Anemia in other chronic diseases classified elsewhere: Secondary | ICD-10-CM | POA: Diagnosis not present

## 2014-05-23 HISTORY — DX: Urinary tract infection, site not specified: N39.0

## 2014-05-23 HISTORY — DX: Sepsis, unspecified organism: A41.9

## 2014-05-23 LAB — BASIC METABOLIC PANEL
Anion gap: 10 (ref 5–15)
BUN: 51 mg/dL — AB (ref 6–23)
CO2: 21 mmol/L (ref 19–32)
CREATININE: 3.52 mg/dL — AB (ref 0.50–1.35)
Calcium: 8.1 mg/dL — ABNORMAL LOW (ref 8.4–10.5)
Chloride: 103 mmol/L (ref 96–112)
GFR calc Af Amer: 19 mL/min — ABNORMAL LOW (ref >90)
GFR, EST NON AFRICAN AMERICAN: 17 mL/min — AB (ref >90)
Glucose, Bld: 130 mg/dL — ABNORMAL HIGH (ref 70–99)
Potassium: 4.3 mmol/L (ref 3.5–5.1)
SODIUM: 134 mmol/L — AB (ref 135–145)

## 2014-05-23 LAB — URINALYSIS, ROUTINE W REFLEX MICROSCOPIC
BILIRUBIN URINE: NEGATIVE
Glucose, UA: NEGATIVE mg/dL
KETONES UR: NEGATIVE mg/dL
NITRITE: NEGATIVE
Protein, ur: 100 mg/dL — AB
Specific Gravity, Urine: 1.025 (ref 1.005–1.030)
UROBILINOGEN UA: 1 mg/dL (ref 0.0–1.0)
pH: 5.5 (ref 5.0–8.0)

## 2014-05-23 LAB — CBC
HEMATOCRIT: 28.7 % — AB (ref 39.0–52.0)
Hemoglobin: 9.6 g/dL — ABNORMAL LOW (ref 13.0–17.0)
MCH: 29.8 pg (ref 26.0–34.0)
MCHC: 33.4 g/dL (ref 30.0–36.0)
MCV: 89.1 fL (ref 78.0–100.0)
Platelets: 405 10*3/uL — ABNORMAL HIGH (ref 150–400)
RBC: 3.22 MIL/uL — AB (ref 4.22–5.81)
RDW: 18 % — ABNORMAL HIGH (ref 11.5–15.5)
WBC: 11.2 10*3/uL — AB (ref 4.0–10.5)

## 2014-05-23 LAB — URINE MICROSCOPIC-ADD ON

## 2014-05-23 LAB — I-STAT CG4 LACTIC ACID, ED: LACTIC ACID, VENOUS: 2.35 mmol/L — AB (ref 0.5–2.0)

## 2014-05-23 MED ORDER — ACETAMINOPHEN 500 MG PO TABS
1000.0000 mg | ORAL_TABLET | Freq: Once | ORAL | Status: AC
  Administered 2014-05-23: 1000 mg via ORAL
  Filled 2014-05-23: qty 2

## 2014-05-23 MED ORDER — TRAMADOL HCL 50 MG PO TABS: 50.0000 mg | ORAL_TABLET | Freq: Four times a day (QID) | ORAL | Status: DC | PRN

## 2014-05-23 MED ORDER — DEXTROSE 5 % IV SOLN
1.0000 g | Freq: Once | INTRAVENOUS | Status: AC
  Administered 2014-05-23: 1 g via INTRAVENOUS
  Filled 2014-05-23: qty 10

## 2014-05-23 MED ORDER — SODIUM CHLORIDE 0.9 % IV BOLUS (SEPSIS)
250.0000 mL | Freq: Once | INTRAVENOUS | Status: AC
  Administered 2014-05-23: 250 mL via INTRAVENOUS

## 2014-05-23 MED ORDER — SODIUM CHLORIDE 0.9 % IV SOLN: INTRAVENOUS | Status: DC

## 2014-05-23 MED ORDER — ENOXAPARIN SODIUM 30 MG/0.3ML ~~LOC~~ SOLN
30.0000 mg | SUBCUTANEOUS | Status: DC
  Administered 2014-05-24 – 2014-05-25 (×2): 30 mg via SUBCUTANEOUS
  Filled 2014-05-23 (×2): qty 0.3

## 2014-05-23 MED ORDER — DEXTROSE 5 % IV SOLN
1.0000 g | INTRAVENOUS | Status: DC
  Administered 2014-05-23: 1 g via INTRAVENOUS

## 2014-05-23 MED ORDER — SODIUM CHLORIDE 0.9 % IV SOLN
INTRAVENOUS | Status: DC
  Administered 2014-05-23 – 2014-05-24 (×3): via INTRAVENOUS

## 2014-05-23 MED ORDER — ENSURE COMPLETE PO LIQD
237.0000 mL | Freq: Two times a day (BID) | ORAL | Status: DC
  Administered 2014-05-24 – 2014-05-26 (×5): 237 mL via ORAL

## 2014-05-23 MED ORDER — ACETAMINOPHEN 325 MG PO TABS: 650.0000 mg | ORAL_TABLET | Freq: Four times a day (QID) | ORAL | Status: DC | PRN

## 2014-05-23 MED ORDER — ONDANSETRON HCL 4 MG PO TABS: 4.0000 mg | ORAL_TABLET | Freq: Four times a day (QID) | ORAL | Status: DC | PRN

## 2014-05-23 MED ORDER — OXYCODONE HCL 5 MG PO TABS
10.0000 mg | ORAL_TABLET | ORAL | Status: DC | PRN
  Administered 2014-05-24 (×2): 10 mg via ORAL
  Filled 2014-05-23 (×2): qty 2

## 2014-05-23 MED ORDER — CALCIUM CARBONATE-VITAMIN D 500-200 MG-UNIT PO TABS
2.0000 | ORAL_TABLET | Freq: Every day | ORAL | Status: DC
  Administered 2014-05-25 – 2014-05-26 (×2): 2 via ORAL
  Filled 2014-05-23 (×2): qty 2

## 2014-05-23 MED ORDER — SODIUM CHLORIDE 0.9 % IJ SOLN
3.0000 mL | Freq: Two times a day (BID) | INTRAMUSCULAR | Status: DC
  Administered 2014-05-24 – 2014-05-26 (×4): 3 mL via INTRAVENOUS

## 2014-05-23 MED ORDER — SODIUM CHLORIDE 0.9 % IV SOLN
INTRAVENOUS | Status: DC
  Administered 2014-05-23: 750 mL via INTRAVENOUS

## 2014-05-23 MED ORDER — CEFTRIAXONE SODIUM IN DEXTROSE 20 MG/ML IV SOLN
1.0000 g | INTRAVENOUS | Status: DC
  Administered 2014-05-24 – 2014-05-26 (×3): 1 g via INTRAVENOUS
  Filled 2014-05-23 (×3): qty 50

## 2014-05-23 MED ORDER — ONDANSETRON HCL 4 MG/2ML IJ SOLN: 4.0000 mg | Freq: Four times a day (QID) | INTRAMUSCULAR | Status: DC | PRN

## 2014-05-23 NOTE — ED Notes (Addendum)
Per EMS, pt from [facility], pt has prostate cancer and has low grade fever and cloudy urine in urine collection bag from indwelling catheter (nephrostomy), pt denies pain at this time.

## 2014-05-23 NOTE — H&P (Signed)
[facility] History and Physical  Jeff [name] ZOX:[id number] DOB: [birth year] DOA: 05/23/2014  Referring physician: ER PCP: [name], MD   Chief Complaint: Fever, cloudy urine.  HPI: Jeff [name] is a 69 y.o. male  This is a 70 year old man who had onset of cloudy urine for the past several days. He also apparently had low-grade fevers in the nursing home that he currently resides in. He denies any abdominal pain, nausea, vomiting, chest pain, dyspnea, diarrhea. He has not had altered mental status. This patient has stage IV metastatic state cancer and also history of hydronephrosis, status post bilateral nephrostomy tubes which were changed and late December 2015. He has been receiving chemotherapy from oncology. Evaluation in the emergency room shows him to have a fever, resting tachycardia and urine consistent with UTI. He is now being admitted for further management.   Review of Systems:  Apart from symptoms above, other systems are negative.   Past Medical History  Diagnosis Date  . Acute renal failure 01/26/2014  . Anemia of chronic disease 01/26/2014  . Bilateral hydronephrosis 01/26/2014  . History of cocaine abuse 01/26/2014  . Homelessness 01/31/2014  . 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 Alcohol abuse    Past Surgical History  Procedure Laterality Date  . Orchiectomy Bilateral 02/05/2014    Procedure: BILATERAL ORCHIECTOMY;  Surgeon: Festus Aloe, MD;  Location: [facility];  Service: Urology;  Laterality: Bilateral;  . Portacath placement Right 03/12/14  . Percutaneous nephrostomy Bilateral     IR Dr. Junious Silk changed on 04/22/2014   Social History:  reports that he has been smoking Cigarettes.  He has a 13.75 pack-year smoking history. He has never used smokeless tobacco. He reports that he uses illicit drugs (Cocaine). He reports that he does not drink alcohol.  No Known Allergies  Family History  Problem Relation Age of  Onset  . Cancer Brother 60      Prior to Admission medications   Medication Sig Start Date End Date Taking? Authorizing Provider  acetaminophen (TYLENOL) 325 MG tablet Take 2 tablets (650 mg total) by mouth every 6 (six) hours as needed for mild pain (or Fever >/= 101). 02/01/14   Venetia Maxon Rama, MD  calcium-vitamin D (OSCAL WITH D) 500-200 MG-UNIT per tablet Take 2 tablets by mouth daily with breakfast. 04/04/14   Baird Cancer, PA-C  dexamethasone (DECADRON) 4 MG tablet The day before, day of, and day after chemo take 2 tabs in the am and 2 tabs in the pm. (Take with food). 04/24/14   Baird Cancer, PA-C  lidocaine-prilocaine (EMLA) cream Apply a quarter size amount to port site 1 hour prior to chemo. Do not rub in. Cover with plastic wrap. 03/07/14   Baird Cancer, PA-C  loperamide (IMODIUM A-D) 2 MG tablet After first loose stool take 2 tabs then take 1 tab every 2 hours as needed for loose stools. Call if diarrhea persists more than 12 hours. 03/07/14   Baird Cancer, PA-C  metoCLOPramide (REGLAN) 5 MG tablet The day after chemo take 1 tab four times a day x 2 days. Then may take 1 tab four times a day if needed for nausea/vomiting. 04/24/14   Baird Cancer, PA-C  oxyCODONE (OXY IR/ROXICODONE) 5 MG immediate release tablet Up to 2 tablets every 4 hours to control pain. 05/14/14   Baird Cancer, PA-C  prochlorperazine (COMPAZINE) 10 MG tablet The day after chemo take 1 tab four times a day  x 2 days. Then may take 1 tab four times a day if needed for nausea/vomiting. 04/24/14   Baird Cancer, PA-C  traMADol (ULTRAM) 50 MG tablet Take 1 tablet (50 mg total) by mouth every 6 (six) hours as needed. 02/01/14   Venetia Maxon Rama, MD   Physical Exam: Filed Vitals:   05/23/14 1430 05/23/14 1531 05/23/14 1543 05/23/14 1600  BP: 122/95 123/109    Pulse: 124     Temp:    100.7 F (38.2 C)  TempSrc:    Rectal  Resp: 24 19 23    Height:      Weight:      SpO2: 96%       Wt  Readings from Last 3 Encounters:  05/23/14 78.926 kg (174 lb)  05/16/14 83.28 kg (183 lb 9.6 oz)  05/15/14 82.101 kg (181 lb)    General:  Appears clinically dehydrated. He does look somewhat septic. He has a resting tachycardia, thankfully his blood pressure is acceptable. Eyes: PERRL, normal lids, irises & conjunctiva ENT: grossly normal hearing, lips & tongue Neck: no LAD, masses or thyromegaly Cardiovascular: RRR, no m/r/g. No LE edema. Telemetry: SR, no arrhythmias  Respiratory: CTA bilaterally, no w/r/r. Normal respiratory effort. Abdomen: soft, ntnd Skin: no rash or induration seen on limited exam Musculoskeletal: grossly normal tone BUE/BLE Psychiatric: grossly normal mood and affect, speech fluent and appropriate Neurologic: grossly non-focal. slightly drowsy.           Labs on Admission:  Basic Metabolic Panel:  Recent Labs Lab 05/23/14 1339  NA 134*  K 4.3  CL 103  CO2 21  GLUCOSE 130*  BUN 51*  CREATININE 3.52*  CALCIUM 8.1*   Liver Function Tests: No results for input(s): AST, ALT, ALKPHOS, BILITOT, PROT, ALBUMIN in the last 168 hours. No results for input(s): LIPASE, AMYLASE in the last 168 hours. No results for input(s): AMMONIA in the last 168 hours. CBC:  Recent Labs Lab 05/23/14 1339  WBC 11.2*  HGB 9.6*  HCT 28.7*  MCV 89.1  PLT 405*   Cardiac Enzymes: No results for input(s): CKTOTAL, CKMB, CKMBINDEX, TROPONINI in the last 168 hours.  BNP (last 3 results) No results for input(s): PROBNP in the last 8760 hours. CBG: No results for input(s): GLUCAP in the last 168 hours.  Radiological Exams on Admission: No results found.    Assessment/Plan   1. UTI with sepsis. He is not in septic shock clinically. He will be treated with intravenous antibiotics. Currently, he looks well enough to be on telemetry floor. If he deteriorates, he will need to be in the stepdown unit or intensive care. 2. Acute on chronic renal failure secondary to  dehydration. He will be treated with fairly aggressive intravenous fluid resuscitation. Monitor renal function closely. 3. Stage IV prostate cancer.  Further recommendations will depend on patient's hospital progress.   Code Status: Full code.   DVT Prophylaxis: Lovenox.  Family Communication: I discussed the plan with the patient at the bedside.   Disposition Plan: Home when medically stable.   Time spent: 60 minutes.  Doree Albee [facility] Pager [phone].

## 2014-05-23 NOTE — ED Provider Notes (Signed)
CSN: [id number]     Arrival date & time 05/23/14  1327 History   First MD Initiated Contact with Patient 05/23/14 1402     Chief Complaint  Patient presents with  . Urinary Tract Infection      HPI Pt was seen at 1430.  Per EMS, NH report and pt: c/o gradual onset and persistence of constant "cloudy urine" for the past several days. NH states pt has had "low grade fevers" and pt states he has been "waking up at night drenched in sweat feeling like I had fever." Denies any other complaints. Denies CP/SOB, no abd pain, no N/V/D, no back pain, no rash. Pt with significant hx of prostate CA with LD chemo 05/15/14.     Past Medical History  Diagnosis Date  . Acute renal failure 01/26/2014  . Anemia of chronic disease 01/26/2014  . Bilateral hydronephrosis 01/26/2014  . History of cocaine abuse 01/26/2014  . Homelessness 01/31/2014  . 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 Alcohol abuse    Past Surgical History  Procedure Laterality Date  . Orchiectomy Bilateral 02/05/2014    Procedure: BILATERAL ORCHIECTOMY;  Surgeon: Festus Aloe, MD;  Location: [facility];  Service: Urology;  Laterality: Bilateral;  . Portacath placement Right 03/12/14  . Percutaneous nephrostomy Bilateral     IR Dr. Junious Silk changed on 04/22/2014   Family History  Problem Relation Age of Onset  . Cancer Brother 11   History  Substance Use Topics  . Smoking status: Current Every Day Smoker -- 0.25 packs/day for 55 years    Types: Cigarettes  . Smokeless tobacco: Never Used  . Alcohol Use: No     Comment: none for at least 7 -8 years    Review of Systems ROS: Statement: All systems negative except as marked or noted in the HPI; Constitutional: +subjective fever and chills. ; ; Eyes: Negative for eye pain, redness and discharge. ; ; ENMT: Negative for ear pain, hoarseness, nasal congestion, sinus pressure and sore throat. ; ; Cardiovascular: Negative for chest pain, palpitations, diaphoresis,  dyspnea and peripheral edema. ; ; Respiratory: Negative for cough, wheezing and stridor. ; ; Gastrointestinal: Negative for nausea, vomiting, diarrhea, abdominal pain, blood in stool, hematemesis, jaundice and rectal bleeding. . ; ; Genitourinary: +"cloudy urine." Negative for dysuria, flank pain and hematuria. ; ; Musculoskeletal: Negative for back pain and neck pain. Negative for swelling and trauma.; ; Skin: Negative for pruritus, rash, abrasions, blisters, bruising and skin lesion.; ; Neuro: Negative for headache, lightheadedness and neck stiffness. Negative for weakness, altered level of consciousness , altered mental status, extremity weakness, paresthesias, involuntary movement, seizure and syncope.      Allergies  Review of patient's allergies indicates no known allergies.  Home Medications   Prior to Admission medications   Medication Sig Start Date End Date Taking? Authorizing Provider  acetaminophen (TYLENOL) 325 MG tablet Take 2 tablets (650 mg total) by mouth every 6 (six) hours as needed for mild pain (or Fever >/= 101). 02/01/14   Venetia Maxon Rama, MD  calcium-vitamin D (OSCAL WITH D) 500-200 MG-UNIT per tablet Take 2 tablets by mouth daily with breakfast. 04/04/14   Baird Cancer, PA-C  dexamethasone (DECADRON) 4 MG tablet The day before, day of, and day after chemo take 2 tabs in the am and 2 tabs in the pm. (Take with food). 04/24/14   Baird Cancer, PA-C  lidocaine-prilocaine (EMLA) cream Apply a quarter size amount to port site 1 hour  prior to chemo. Do not rub in. Cover with plastic wrap. 03/07/14   Baird Cancer, PA-C  loperamide (IMODIUM A-D) 2 MG tablet After first loose stool take 2 tabs then take 1 tab every 2 hours as needed for loose stools. Call if diarrhea persists more than 12 hours. 03/07/14   Baird Cancer, PA-C  metoCLOPramide (REGLAN) 5 MG tablet The day after chemo take 1 tab four times a day x 2 days. Then may take 1 tab four times a day if needed for  nausea/vomiting. 04/24/14   Baird Cancer, PA-C  oxyCODONE (OXY IR/ROXICODONE) 5 MG immediate release tablet Up to 2 tablets every 4 hours to control pain. 05/14/14   Baird Cancer, PA-C  prochlorperazine (COMPAZINE) 10 MG tablet The day after chemo take 1 tab four times a day x 2 days. Then may take 1 tab four times a day if needed for nausea/vomiting. 04/24/14   Baird Cancer, PA-C  traMADol (ULTRAM) 50 MG tablet Take 1 tablet (50 mg total) by mouth every 6 (six) hours as needed. 02/01/14   Christina P Rama, MD   BP 122/95 mmHg  Pulse 124  Temp(Src) 102.1 F (38.9 C) (Rectal)  Resp 24  Ht 6' (1.829 m)  Wt 174 lb (78.926 kg)  BMI 23.59 kg/m2  SpO2 96% Physical Exam  1430: Physical examination:  Nursing notes reviewed; Vital signs and O2 SAT reviewed;  Constitutional: Well developed, Well nourished, In no acute distress; Head:  Normocephalic, atraumatic; Eyes: EOMI, PERRL, No scleral icterus; ENMT: Mouth and pharynx normal, Mucous membranes dry; Neck: Supple, Full range of motion, No lymphadenopathy; Cardiovascular: Tachycardic rate and rhythm, No gallop; Respiratory: Breath sounds clear & equal bilaterally, No wheezes.  Speaking full sentences with ease, Normal respiratory effort/excursion; Chest: Nontender, Movement normal; Abdomen: Soft, Nontender, Nondistended, Normal bowel sounds; Genitourinary: No CVA tenderness; Spine:  No midline CS, TS, LS tenderness. +bilat nephrostomy tubes with sites intact, no erythema, no drainage, no tenderness.;; Extremities: Pulses normal, No tenderness, No edema, No calf edema or asymmetry.; Neuro: AA&Ox3, vague historian. Major CN grossly intact.  Speech clear. No gross focal motor or sensory deficits in extremities.; Skin: Color normal, Warm, Dry.   ED Course  Procedures     EKG Interpretation None      MDM  MDM Reviewed: vitals, nursing note and previous chart Reviewed previous: labs Interpretation: labs     Results for orders placed or  performed during the hospital encounter of 05/23/14  Urinalysis, Routine w reflex microscopic  Result Value Ref Range   Color, Urine YELLOW YELLOW   APPearance HAZY (A) CLEAR   Specific Gravity, Urine 1.025 1.005 - 1.030   pH 5.5 5.0 - 8.0   Glucose, UA NEGATIVE NEGATIVE mg/dL   Hgb urine dipstick LARGE (A) NEGATIVE   Bilirubin Urine NEGATIVE NEGATIVE   Ketones, ur NEGATIVE NEGATIVE mg/dL   Protein, ur 100 (A) NEGATIVE mg/dL   Urobilinogen, UA 1.0 0.0 - 1.0 mg/dL   Nitrite NEGATIVE NEGATIVE   Leukocytes, UA SMALL (A) NEGATIVE  CBC  Result Value Ref Range   WBC 11.2 (H) 4.0 - 10.5 K/uL   RBC 3.22 (L) 4.22 - 5.81 MIL/uL   Hemoglobin 9.6 (L) 13.0 - 17.0 g/dL   HCT 28.7 (L) 39.0 - 52.0 %   MCV 89.1 78.0 - 100.0 fL   MCH 29.8 26.0 - 34.0 pg   MCHC 33.4 30.0 - 36.0 g/dL   RDW 18.0 (H) 11.5 - 15.5 %  Platelets 405 (H) 150 - 400 K/uL  Basic metabolic panel  Result Value Ref Range   Sodium 134 (L) 135 - 145 mmol/L   Potassium 4.3 3.5 - 5.1 mmol/L   Chloride 103 96 - 112 mmol/L   CO2 21 19 - 32 mmol/L   Glucose, Bld 130 (H) 70 - 99 mg/dL   BUN 51 (H) 6 - 23 mg/dL   Creatinine, Ser 3.52 (H) 0.50 - 1.35 mg/dL   Calcium 8.1 (L) 8.4 - 10.5 mg/dL   GFR calc non Af Amer 17 (L) >90 mL/min   GFR calc Af Amer 19 (L) >90 mL/min   Anion gap 10 5 - 15  Urine microscopic-add on  Result Value Ref Range   Squamous Epithelial / LPF FEW (A) RARE   WBC, UA TOO NUMEROUS TO COUNT <3 WBC/hpf   RBC / HPF 7-10 <3 RBC/hpf   Bacteria, UA MANY (A) RARE  I-Stat CG4 Lactic Acid, ED  Result Value Ref Range   Lactic Acid, Venous 2.35 (HH) 0.5 - 2.0 mmol/L   Comment NOTIFIED PHYSICIAN      1540:  +UTI, UC pending, and BC also obtained; will dose IV rocephin. APAP and IVF given for fever and tachycardia. Dx and testing d/w pt.  Questions answered.  Verb understanding, agreeable to admit.  T/C to [facility] Dr. Anastasio Champion, case discussed, including:  HPI, pertinent PM/SHx, VS/PE, dx testing, ED course and  treatment:  Agreeable to admit, requests to write temporary orders, obtain tele bed to team APAdmits.     Francine Graven, DO 05/27/14 1231

## 2014-05-24 ENCOUNTER — Inpatient Hospital Stay (HOSPITAL_COMMUNITY): Payer: Medicare Other

## 2014-05-24 ENCOUNTER — Encounter (HOSPITAL_COMMUNITY): Payer: Self-pay | Admitting: Internal Medicine

## 2014-05-24 DIAGNOSIS — E440 Moderate protein-calorie malnutrition: Secondary | ICD-10-CM | POA: Diagnosis present

## 2014-05-24 DIAGNOSIS — B377 Candidal sepsis: Secondary | ICD-10-CM

## 2014-05-24 DIAGNOSIS — R509 Fever, unspecified: Secondary | ICD-10-CM

## 2014-05-24 DIAGNOSIS — R7881 Bacteremia: Secondary | ICD-10-CM | POA: Diagnosis present

## 2014-05-24 DIAGNOSIS — N1330 Unspecified hydronephrosis: Secondary | ICD-10-CM

## 2014-05-24 LAB — URINE CULTURE: Colony Count: >=100000

## 2014-05-24 LAB — COMPREHENSIVE METABOLIC PANEL
ALK PHOS: 163 U/L — AB (ref 39–117)
ALT: 149 U/L — AB (ref 0–53)
AST: 218 U/L — ABNORMAL HIGH (ref 0–37)
Albumin: 2.4 g/dL — ABNORMAL LOW (ref 3.5–5.2)
Anion gap: 8 (ref 5–15)
BILIRUBIN TOTAL: 0.3 mg/dL (ref 0.3–1.2)
BUN: 43 mg/dL — AB (ref 6–23)
CO2: 18 mmol/L — AB (ref 19–32)
Calcium: 7.3 mg/dL — ABNORMAL LOW (ref 8.4–10.5)
Chloride: 108 mmol/L (ref 96–112)
Creatinine, Ser: 2.92 mg/dL — ABNORMAL HIGH (ref 0.50–1.35)
GFR calc Af Amer: 24 mL/min — ABNORMAL LOW (ref >90)
GFR calc non Af Amer: 21 mL/min — ABNORMAL LOW (ref >90)
Glucose, Bld: 129 mg/dL — ABNORMAL HIGH (ref 70–99)
Potassium: 4.2 mmol/L (ref 3.5–5.1)
SODIUM: 134 mmol/L — AB (ref 135–145)
TOTAL PROTEIN: 7.1 g/dL (ref 6.0–8.3)

## 2014-05-24 LAB — CBC
HCT: 26 % — ABNORMAL LOW (ref 39.0–52.0)
Hemoglobin: 8.7 g/dL — ABNORMAL LOW (ref 13.0–17.0)
MCH: 29.7 pg (ref 26.0–34.0)
MCHC: 33.5 g/dL (ref 30.0–36.0)
MCV: 88.7 fL (ref 78.0–100.0)
PLATELETS: 427 10*3/uL — AB (ref 150–400)
RBC: 2.93 MIL/uL — AB (ref 4.22–5.81)
RDW: 17.8 % — ABNORMAL HIGH (ref 11.5–15.5)
WBC: 15.1 10*3/uL — AB (ref 4.0–10.5)

## 2014-05-24 LAB — LACTIC ACID, PLASMA: Lactic Acid, Venous: 1.3 mmol/L (ref 0.5–2.0)

## 2014-05-24 MED ORDER — IOHEXOL 300 MG/ML  SOLN
50.0000 mL | Freq: Once | INTRAMUSCULAR | Status: AC | PRN
  Administered 2014-05-24: 15 mL

## 2014-05-24 NOTE — Clinical Social Work Psychosocial (Signed)
Clinical Social Work Department BRIEF PSYCHOSOCIAL ASSESSMENT 05/24/2014  Patient:  Jeff [name], Jeff [name]     Account Number:  [id number]     Admit date:  05/23/2014  Clinical Social Worker:  Wyatt Haste  Date/Time:  05/24/2014 10:06 AM  Referred by:  CSW  Date Referred:  05/24/2014 Referred for  ALF Placement   Other Referral:   Interview type:  Patient Other interview type:    PSYCHOSOCIAL DATA Living Status:  FACILITY Admitted from facility:  Other Level of care:  Group Home Primary support name:  Sonia Side Primary support relationship to patient:  SIBLING Degree of support available:   supportive per pt    CURRENT CONCERNS Current Concerns  Post-Acute Placement   Other Concerns:    SOCIAL WORK ASSESSMENT / PLAN CSW met with pt at bedside. Pt alert and oriented and reports he has been a resident at [facility] since October of last year. He describes his best support as his brother, Sonia Side who visits him regularly. He also has several other friends who check in often. Pt was diagnosed with stage IV prostate cancer in October. Pt indicates he has been coping fairly well with this as he has good support system. Pt requests to return to [facility] as this has worked out well for him. Per Shirlean Mylar, supervisor in charge at facility, pt was fairly independent prior to chemo, but now needs limited assist with ADLs. [facility] had [facility]. Facility changes dressings to back. Shirlean Mylar reports they had been encouraging pt to go to hospital, but he refused until yesterday. Okay for return.   Assessment/plan status:  Psychosocial Support/Ongoing Assessment of Needs Other assessment/ plan:   Information/referral to community resources:   [facility]    PATIENT'S/FAMILY'S RESPONSE TO PLAN OF CARE: Pt reports positive feelings regarding return to [facility] when medically stable. CSW will continue to follow.       Benay Pike, [place]

## 2014-05-24 NOTE — Progress Notes (Signed)
Pt waiting at RN station for transport back to [facility]

## 2014-05-24 NOTE — Procedures (Signed)
Interventional Radiology Procedure Note  Procedure: Fluoroscopic assist rescue of right PCN.  Tube withdrawn overnight, with no urine output.  Tract was rescued and placement of new 85F PCN.   Complications: No immediate Recommendations:   - Routine care   Signed,  Dulcy Fanny. Earleen Newport, DO

## 2014-05-24 NOTE — Progress Notes (Addendum)
Patient ID: Jeff [name], male   DOB: [birth year], 69 y.o.   MRN: [id number]   B PCNs placed 01/2014 Met prostate ca B hydronephrosis  Exchanged routinely 02/2014 and 03/2014 Rt has been pulled out partially--yesterday  Now for exchange/replacement  Pt aware and agreeable consent signed andin chart

## 2014-05-24 NOTE — Evaluation (Signed)
Received call about pt w/ report of pain and subsequent pulling out of R sided nephrostomy tube (had bilateral nephrostomy tubes placed 01/28/14 secondary to bilateral ureteral obstruction in setting of bulking prostate cancer). Had clot in R neph tube that was flushed by nursing.  Currently being treated for UTI w/ sepsis (? 2/2 poorly functioning neph tubes) Discussed case w/ on call urology ([place]-resident) Will need nephrostomy replacement by IR within next 24 hours  Will transfer to [facility] if pt spikes high fever or if worsening pain ensues.

## 2014-05-24 NOTE — Progress Notes (Signed)
Pt still waiting for transport back to [facility]. VS stable, pt snacking and A &Ox3. Informed by [facility] that they are approx. 15 minutes out from picking pt up.

## 2014-05-24 NOTE — Progress Notes (Signed)
Pt ran 14 beat run of V-tach on telemetry. VSS. Pt alert and oriented, resting in bed at this time. Dr. Aileen Fass paged and made aware.

## 2014-05-24 NOTE — Progress Notes (Signed)
[facility] PROGRESS NOTE  Jeff [name] YSA:[id number] DOB: March 31, 1946 DOA: 05/23/2014 PCP: [name], MD  Assessment/Plan: 1. UTI with sepsis. Improved this am. Hemodynamically stable. Afebrile. Non-toxic appearing. Continue rocephin. One +blood culture gram neg rods. Possible contaminant. Await urine culture 2. Acute on chronic renal failure secondary to dehydration. Improved with IV fluids. Baseline creatine seems to be 1.4-1.5. 3.5 on admission, 2.9 this am. Continue IV fluids.  Monitor renal function closely. 3. Stage IV prostate cancer. Chart review indicates metastatic to multiple sites. S/p bilateral orchiectomy and currently on Taxotere and tolerating well. Not indicates PSA improving.  4. Anemia: related to chronic disease. Currently at baseline. \ 5. Bacteremia: one blood culture +. See #1. Afebrile and non-toxic  6. Bilateral hydronephrosis: s/p drains. Right drain dislodged during night. Will be exchanged today. Monitor.   Code Status: full Family Communication: none present Disposition Plan: group home when ready   Consultants:  IR for   Procedures:  Right PCN exchange 05/24/14  Antibiotics:  Rocephin 05/23/14>>  HPI/Subjective: Lying in bed. Requesting something to eat or drink. Reports feeling better this am.   Objective: Filed Vitals:   05/24/14 0816  BP: 107/63  Pulse: 112  Temp: 98.3 F (36.8 C)  Resp:     Intake/Output Summary (Last 24 hours) at 05/24/14 1226 Last data filed at 05/24/14 1000  Gross per 24 hour  Intake      0 ml  Output    820 ml  Net   -820 ml   Filed Weights   05/23/14 1328  Weight: 78.926 kg (174 lb)    Exam:   General:  Thin somewhat frail, no acute distress  Cardiovascular: RRR no MGR no LE edema  Respiratory: normal effort BS clear bilaterally no wheeze  Abdomen: flat soft +BS non-tender bilateral nephrostomy tube with air in bag on right   Musculoskeletal: no clubbing or cyanosis  Data  Reviewed: Basic Metabolic Panel:  Recent Labs Lab 05/23/14 1339 05/24/14 0509  NA 134* 134*  K 4.3 4.2  CL 103 108  CO2 21 18*  GLUCOSE 130* 129*  BUN 51* 43*  CREATININE 3.52* 2.92*  CALCIUM 8.1* 7.3*   Liver Function Tests:  Recent Labs Lab 05/24/14 0509  AST 218*  ALT 149*  ALKPHOS 163*  BILITOT 0.3  PROT 7.1  ALBUMIN 2.4*   No results for input(s): LIPASE, AMYLASE in the last 168 hours. No results for input(s): AMMONIA in the last 168 hours. CBC:  Recent Labs Lab 05/23/14 1339 05/24/14 0509  WBC 11.2* 15.1*  HGB 9.6* 8.7*  HCT 28.7* 26.0*  MCV 89.1 88.7  PLT 405* 427*   Cardiac Enzymes: No results for input(s): CKTOTAL, CKMB, CKMBINDEX, TROPONINI in the last 168 hours. BNP (last 3 results) No results for input(s): PROBNP in the last 8760 hours. CBG: No results for input(s): GLUCAP in the last 168 hours.  Recent Results (from the past 240 hour(s))  Blood culture (routine x 2)     Status: None (Preliminary result)   Collection Time: 05/23/14  1:39 PM  Result Value Ref Range Status   Specimen Description BLOOD LEFT ANTECUBITAL  Final   Special Requests BOTTLES DRAWN AEROBIC ONLY 4CC  Final   Culture NO GROWTH 1 DAY  Final   Report Status PENDING  Incomplete  Blood culture (routine x 2)     Status: None (Preliminary result)   Collection Time: 05/23/14  3:40 PM  Result Value Ref Range Status   Specimen Description  BLOOD RIGHT ARM DRAWN BY RN GM  Final   Special Requests BOTTLES DRAWN AEROBIC AND ANAEROBIC 6CC  Final   Culture   Final    GRAM NEGATIVE RODS Gram Stain Report Called to,Read Back By and Verified With: J.MAYS AT 5852 ON 05/24/14 BY S.VANHOORNE Performed at [facility]    Report Status PENDING  Incomplete     Studies: No results found.  Scheduled Meds: . calcium-vitamin D  2 tablet Oral Q breakfast  . cefTRIAXone (ROCEPHIN)  IV  1 g Intravenous Q24H  . enoxaparin (LOVENOX) injection  30 mg Subcutaneous Q24H  . feeding  supplement (ENSURE COMPLETE)  237 mL Oral BID BM  . sodium chloride  3 mL Intravenous Q12H   Continuous Infusions: . 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 . If 7PM-7AM, please contact night-coverage at [email or web address], password [facility] 05/24/2014, 12:26 PM  LOS: 1 day

## 2014-05-24 NOTE — Progress Notes (Signed)
CRITICAL VALUE ALERT  Critical value received:  Blood Culture + Gram Negative Rods  Date of notification:  05/24/14  Time of notification:  0715  Critical value read back:Yes.    Nurse who received alert:  Sharyn Blitz, RN  MD notified (1st page):  Dr. Aileen Fass  Time of first page:  0725  MD notified (2nd page):  Time of second page:  Responding MD:   Time MD responded:

## 2014-05-24 NOTE — Progress Notes (Signed)
Pt was turning in the bed and experienced some pain. Upon assessment noted that sutures were not attached. MD was made aware. George Hugh D, RN

## 2014-05-24 NOTE — Progress Notes (Signed)
UR chart review completed.  

## 2014-05-24 NOTE — Progress Notes (Signed)
INITIAL NUTRITION ASSESSMENT  DOCUMENTATION CODES Per approved criteria  -Non-severe (moderate) malnutrition in the context of chronic illness  Pt meets criteria for non-severe (moderate) malnutrition in the context of chronic illness as evidenced by 5% wt loss in <1 month, and mild-moderate muscle mass and body fat depletion.  INTERVENTION: Provide chocolate Ensure BID upon diet advancement  NUTRITION DIAGNOSIS: Increased nutrient needs related to chronic illness as evidenced by stage IV metastatic prostate cancer, UTI, fever.   Goal: Pt to meet >/= 90% of estimated energy requirements; ongoing  Monitor:   Weight, PO intake, labs  Reason for Assessment: Pt identified due to Malnutrition Screening Tool (report)  69 y.o. male   Admitting Dx: Fever, UTI  ASSESSMENT: 69 y/o male with onset of cloudy urine for the past several days and low-grade fevers in nursing home that he currently resides in. Admitted to the ED with fever, resting tachycardia and urine consistent with UTI. Pt denies any abdominal pain, nausea, vomiting, chest pain, dyspnea or diarrhea. He has hx of stage IV metastatic prostate cancer and has been receiving chemotherapy from oncology. Also hx of hydronephrosis, status post bilateral nephrostomy tubes which were changed late December 2015. Reports smoking cigarettes for past 13.75 years, and using illicit drugs (cocaine).  Currently receiving Calcium-Vitamin D; scheduled to receive Ensure BID upon diet advancement  Pt wt hx indicates 5% wt loss within past week. Pt confirmed wt hx, and stated usual body wt is 174 lbs. Pt reported no changes in appetite, even during chemotherapy. He complained of "dry mouth." DI provided handout for dealing with dry mouth and thick saliva. Pt denied nausea, vomiting, diarrhea or abdominal pain. He denied any difficulty chewing or swallowing.   Pt usually eats toast with jam for breakfast and "whatever they give him" at the nursing home  for lunch and dinner. Pt reported 75-100% PO intake. He likes Ensure, and prefers chocolate flavor.  Labs show hypocalcemia, poor BUN/creatinine ratio, hyponatremia   Provide chocolate Ensure BID upon diet advancement  Nutrition Focused Physical Exam:  Subcutaneous Fat:  Orbital Region: mild depletion Upper Arm Region: mild depletion Thoracic and Lumbar Region: moderate depletion  Muscle:  Temple Region: moderate depletion Clavicle Bone Region: moderate depletion Clavicle and Acromion Bone Region: moderate depletion Scapular Bone Region: mild depletion Dorsal Hand: WDL Patellar Region: moderate depletion Anterior Thigh Region: moderate depletion Posterior Calf Region: moderate depletion  Edema: not present  Height: Ht Readings from Last 1 Encounters:  05/23/14 6' (1.829 m)    Weight: Wt Readings from Last 1 Encounters:  05/23/14 174 lb (78.926 kg)    Ideal Body Weight: 178 lbs (80.9 kg)  % Ideal Body Weight: 97%  Wt Readings from Last 10 Encounters:  05/23/14 174 lb (78.926 kg)  05/16/14 183 lb 9.6 oz (83.28 kg)  05/15/14 181 lb (82.101 kg)  04/25/14 175 lb 8 oz (79.606 kg)  04/24/14 174 lb 9.6 oz (79.198 kg)  04/04/14 175 lb 9.6 oz (79.652 kg)  03/20/14 166 lb 3.2 oz (75.388 kg)  03/14/14 163 lb 3.2 oz (74.027 kg)  02/26/14 155 lb 9.6 oz (70.58 kg)  02/12/14 156 lb (70.761 kg)    Usual Body Weight: 174 lbs (79 kg)  % Usual Body Weight: 100%  BMI:  Body mass index is 23.59 kg/(m^2).  Estimated Nutritional Needs: Kcal: 2000-2200 kcal Protein: 115-130 grams Fluid: >/=1.8 L/day  Skin: intact  Diet Order: Diet NPO time specified  EDUCATION NEEDS: -No education needs identified at this time  Intake/Output Summary (Last 24 hours) at 05/24/14 0933 Last data filed at 05/23/14 1900  Gross per 24 hour  Intake      0 ml  Output    420 ml  Net   -420 ml    Last BM: PTA   Labs:   Recent Labs Lab 05/23/14 1339 05/24/14 0509  NA 134* 134*  K  4.3 4.2  CL 103 108  CO2 21 18*  BUN 51* 43*  CREATININE 3.52* 2.92*  CALCIUM 8.1* 7.3*  GLUCOSE 130* 129*    CBG (last 3)  No results for input(s): GLUCAP in the last 72 hours.  Scheduled Meds: . calcium-vitamin D  2 tablet Oral Q breakfast  . cefTRIAXone (ROCEPHIN)  IV  1 g Intravenous Q24H  . enoxaparin (LOVENOX) injection  30 mg Subcutaneous Q24H  . feeding supplement (ENSURE COMPLETE)  237 mL Oral BID BM  . sodium chloride  3 mL Intravenous Q12H    Continuous Infusions: . sodium chloride 125 mL/hr at 05/24/14 0146    Past Medical History  Diagnosis Date  . Acute renal failure 01/26/2014  . Anemia of chronic disease 01/26/2014  . Bilateral hydronephrosis 01/26/2014  . History of cocaine abuse 01/26/2014  . Homelessness 01/31/2014  . 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 Alcohol abuse     Past Surgical History  Procedure Laterality Date  . Orchiectomy Bilateral 02/05/2014    Procedure: BILATERAL ORCHIECTOMY;  Surgeon: Festus Aloe, MD;  Location: [facility];  Service: Urology;  Laterality: Bilateral;  . Portacath placement Right 03/12/14  . Percutaneous nephrostomy Bilateral     IR Dr. Junious Silk changed on 04/22/2014    Wynona Dove, MS Dietetic Intern Pager: [phone]

## 2014-05-24 NOTE — Care Management Note (Unsigned)
    Page 1 of 1   05/24/2014     10:31:33 AM CARE MANAGEMENT NOTE 05/24/2014  Patient:  Jeff [name], Jeff [name]   Account Number:  [id number]  Date Initiated:  05/24/2014  Documentation initiated by:  Theophilus Kinds  Subjective/Objective Assessment:   Pt admitted from [facility] with sepsis. Pt has [place] following pt at facility for nephrostomy tube care. Anticipate discharge back to facility.     Action/Plan:   CSW to arrange discharge to facility when medically stable. CM will arrange resumption of HH.   Anticipated DC Date:  05/27/2014   Anticipated DC Plan:  ASSISTED LIVING / REST HOME  In-house referral  Clinical Social Worker      DC Planning Services  CM consult      [facility] Choice  Resumption Of Svcs/PTA Provider   Choice offered to / List presented to:  C-2 HC POA / Guardian        HH arranged  HH-1 Therapist, sports      [facility].   Status of service:  In process, will continue to follow Medicare Important Message given?  YES (If response is "NO", the following Medicare IM given date fields will be blank) Date Medicare IM given:  05/24/2014 Medicare IM given by:  Theophilus Kinds Date Additional Medicare IM given:   Additional Medicare IM given by:    Discharge Disposition:    Per UR Regulation:    If discussed at Long Length of Stay Meetings, dates discussed:    Comments:  05/24/14 [place], RN BSN CM Per CSW pt is from [facility] not [facility].  05/24/14 [place], RN BSN CM

## 2014-05-25 DIAGNOSIS — A4151 Sepsis due to Escherichia coli [E. coli]: Principal | ICD-10-CM

## 2014-05-25 LAB — BASIC METABOLIC PANEL
Anion gap: 9 (ref 5–15)
BUN: 33 mg/dL — ABNORMAL HIGH (ref 6–23)
CO2: 19 mmol/L (ref 19–32)
CREATININE: 2.48 mg/dL — AB (ref 0.50–1.35)
Calcium: 7.3 mg/dL — ABNORMAL LOW (ref 8.4–10.5)
Chloride: 108 mmol/L (ref 96–112)
GFR calc Af Amer: 29 mL/min — ABNORMAL LOW (ref >90)
GFR calc non Af Amer: 25 mL/min — ABNORMAL LOW (ref >90)
GLUCOSE: 135 mg/dL — AB (ref 70–99)
Potassium: 3.9 mmol/L (ref 3.5–5.1)
Sodium: 136 mmol/L (ref 135–145)

## 2014-05-25 NOTE — Progress Notes (Signed)
[facility] PROGRESS NOTE  Assessment/Plan:  Sepsis due to Escherichia coli bacteremia: - Blood cultures are positive for Escherichia coli, he's currently on Rocephin. - Has remained afebrile, nephrostomy tubes exchange as recommended by urology. - Sensitivities pending. - DC telemetry.  Acute on chronic renal disease: - Most likely prerenal in etiology, basic metabolic panels pending. - Baseline creatinine 1.4. Continue monitor creatinine.  Bilateral hydronephrosis: - Discussed post replacement by IR.  Anemia of chronic disease: - Hemoglobin currently at baseline most likely due to renal dysfunction.   Code Status: full Family Communication: none present Disposition Plan: group home when ready   Consultants:  IR  Procedures:  Bilateral nephrostomy tubes replacement.  Antibiotics:  Rocephin 1.28.2016  HPI/Subjective: No complains  Objective: Filed Vitals:   05/24/14 2151 05/24/14 2218 05/24/14 2253 05/25/14 0522  BP: 97/59 107/73 106/55 111/73  Pulse: 109 100 90 111  Temp: 99.7 F (37.6 C) 98 F (36.7 C) 99.2 F (37.3 C) 99.3 F (37.4 C)  TempSrc: Oral Oral Axillary Oral  Resp: 20 20 16 16   Height:      Weight:      SpO2: 97% 99% 97% 97%    Intake/Output Summary (Last 24 hours) at 05/25/14 1231 Last data filed at 05/25/14 0818  Gross per 24 hour  Intake   2220 ml  Output   1300 ml  Net    920 ml   Filed Weights   05/23/14 1328  Weight: 78.926 kg (174 lb)    Exam:  General: Alert, awake, oriented x3, in no acute distress.  HEENT: No bruits, no goiter.  Heart: Regular rate and rhythm. Lungs: Good air movement, clear Abdomen: Soft, nontender, nondistended, positive bowel sounds.  Neuro: Grossly intact, nonfocal.   Data Reviewed: Basic Metabolic Panel:  Recent Labs Lab 05/23/14 1339 05/24/14 0509  NA 134* 134*  K 4.3 4.2  CL 103 108  CO2 21 18*  GLUCOSE 130* 129*  BUN 51* 43*  CREATININE 3.52* 2.92*  CALCIUM 8.1*  7.3*   Liver Function Tests:  Recent Labs Lab 05/24/14 0509  AST 218*  ALT 149*  ALKPHOS 163*  BILITOT 0.3  PROT 7.1  ALBUMIN 2.4*   No results for input(s): LIPASE, AMYLASE in the last 168 hours. No results for input(s): AMMONIA in the last 168 hours. CBC:  Recent Labs Lab 05/23/14 1339 05/24/14 0509  WBC 11.2* 15.1*  HGB 9.6* 8.7*  HCT 28.7* 26.0*  MCV 89.1 88.7  PLT 405* 427*   Cardiac Enzymes: No results for input(s): CKTOTAL, CKMB, CKMBINDEX, TROPONINI in the last 168 hours. BNP (last 3 results) No results for input(s): PROBNP in the last 8760 hours. CBG: No results for input(s): GLUCAP in the last 168 hours.  Recent Results (from the past 240 hour(s))  Blood culture (routine x 2)     Status: None (Preliminary result)   Collection Time: 05/23/14  1:39 PM  Result Value Ref Range Status   Specimen Description BLOOD LEFT ANTECUBITAL  Final   Special Requests BOTTLES DRAWN AEROBIC ONLY 4CC  Final   Culture NO GROWTH 2 DAYS  Final   Report Status PENDING  Incomplete  Urine culture     Status: None   Collection Time: 05/23/14  2:25 PM  Result Value Ref Range Status   Specimen Description URINE, CATHETERIZED  Final   Special Requests NONE  Final   Colony Count   Final    >=100,000 COLONIES/ML Performed at [facility]  Culture   Final    Multiple bacterial morphotypes present, none predominant. Suggest appropriate recollection if clinically indicated. Performed at [facility]    Report Status 05/24/2014 FINAL  Final  Blood culture (routine x 2)     Status: None (Preliminary result)   Collection Time: 05/23/14  3:40 PM  Result Value Ref Range Status   Specimen Description BLOOD RIGHT ARM DRAWN BY RN GM  Final   Special Requests BOTTLES DRAWN AEROBIC AND ANAEROBIC 6CC  Final   Culture   Final    ESCHERICHIA COLI Note: Gram Stain Report Called to,Read Back By and Verified With:  J MAYS 0714 05/24/14 BY Freddy Finner Performed at [facility] Performed at [facility]    Report Status PENDING  Incomplete     Studies: Ir Nephrostomy Exchange Right  05/24/2014   CLINICAL DATA:  69 year old male with 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 He has had bilateral percutaneous nephrostomy placed 01/28/2014. He has been receiving routine exchange is.  Within the last 24 hr the right side has been accidentally displaced/ withdrawn. There is decreased urine output and he has been referred for evaluation and potential exchange.  EXAM: IR EXCHANGE NEPHROSTOMY RIGHT  FLUOROSCOPIC RESCUE OF RIGHT-SIDED PERCUTANEOUS NEPHROSTOMY TUBE  MEDICATIONS: None  CONTRAST:  39mL OMNIPAQUE IOHEXOL 300 MG/ML  SOLN  FLUOROSCOPY TIME:  Seconds & minutes  PROCEDURE: The procedure, risks, benefits, and alternatives were explained to the patient. Questions regarding the procedure were encouraged and answered. The patient understands and consents to the procedure.  Scout images of the upper abdomen performed once the patient was positioned in prone position on the fluoroscopy table.  The right-sided nephrostomy tube was prepped with Betadine in a sterile fashion, and a sterile drape was applied covering the operative field. A sterile gown and sterile gloves were used for the procedure. Local anesthesia was provided with 1% Lidocaine.  Small amount of contrast was infused through the catheter, identifying a patent tract from the site pole of the nephrostomy catheter into the collecting system and the proximal right ureter.  A combination of a 4 French catheter, a Glidewire, and the plastic inner stiffener from a 10 [place] percutaneous nephrostomy tube were used to navigate the indwelling catheter and wire into the right collecting system.  Once we confirmed are wire position within the right ureter, the old 65 French percutaneous nephrostomy catheter was exchanged for a new 10 French percutaneous nephrostomy catheter.  The loop was  formed in the collecting system and again a small amount of contrast confirmed the position. The catheter was sutured in position at the skin.  Patient tolerated the procedure well and remained hemodynamically stable throughout.  No complications were encountered and no significant blood loss was encounter  COMPLICATIONS: None  FINDINGS: Initial scout image demonstrates withdrawal of the indwelling percutaneous nephrostomy catheter with the pigtail tightly looped at the inferior margin of the right kidney silhouette.  Contrast injection confirms that the catheter has been withdrawn from the collecting system.  Final image demonstrates replacement of a new 10 French catheter into the collecting system after rescue of the tract as above.  IMPRESSION: Status post fluoroscopic assisted rescue and exchange of displaced right-sided percutaneous nephrostomy catheter.  Signed,  Dulcy Fanny. Earleen Newport, DO  [facility]  [facility]  PLAN: The patient may resume his usual schedule for routine exchange is of the catheters, with anticipated exchange of bilateral catheters at the next  appointment to reestablish simultaneous exchange schedule.   Electronically Signed   By: Corrie Mckusick D.O.   On: 05/24/2014 12:50    Scheduled Meds: . calcium-vitamin D  2 tablet Oral Q breakfast  . cefTRIAXone (ROCEPHIN)  IV  1 g Intravenous Q24H  . enoxaparin (LOVENOX) injection  30 mg Subcutaneous Q24H  . feeding supplement (ENSURE COMPLETE)  237 mL Oral BID BM  . sodium chloride  3 mL Intravenous Q12H   Continuous Infusions: . sodium chloride 125 mL/hr at 05/24/14 [name], Jeff  [facility] Pager [phone]. If 8PM-8AM, please contact night-coverage at [email or web address], password [facility] 05/25/2014, 12:31 PM  LOS: 2 days

## 2014-05-26 DIAGNOSIS — E440 Moderate protein-calorie malnutrition: Secondary | ICD-10-CM

## 2014-05-26 LAB — CULTURE, BLOOD (ROUTINE X 2)

## 2014-05-26 MED ORDER — OXYCODONE HCL 5 MG PO TABS: 10.0000 mg | ORAL_TABLET | ORAL | Status: DC

## 2014-05-26 MED ORDER — CIPROFLOXACIN HCL 500 MG PO TABS: 500.0000 mg | ORAL_TABLET | Freq: Two times a day (BID) | ORAL | Status: DC

## 2014-05-26 MED ORDER — ENOXAPARIN SODIUM 40 MG/0.4ML ~~LOC~~ SOLN
40.0000 mg | SUBCUTANEOUS | Status: DC
  Administered 2014-05-26: 40 mg via SUBCUTANEOUS
  Filled 2014-05-26: qty 0.4

## 2014-05-26 NOTE — Progress Notes (Signed)
Notified [facility] patient discharge . Group home states they will provide transportation for patient to return. IV removed. No distress noted. Dressing changed to left and right nephrostomy tubes.

## 2014-05-26 NOTE — Progress Notes (Signed)
Reviewed discharge paperwork with pt. Pt verbalized understanding and signed discharge paperwork. Awaiting transport.

## 2014-05-26 NOTE — Discharge Summary (Signed)
Physician Discharge Summary  Tu Bayle KDX:[id number] DOB: 01-28-1946 DOA: 05/23/2014  PCP: [name], MD  Admit date: 05/23/2014 Discharge date: 05/26/2014  Time spent: 35inutes  Recommendations for Outpatient Follow-up:  1. Follow with PCP as an outpatient. Will need a basic metabolic panel in a week to monitor creatinine. 2. We'll order skilled nursing facility. 3. Will need a follow-up with urology for nephrostomy tubes to 4 weeks.  Discharge Diagnoses:  Principal Problem:   Sepsis Active Problems:   Acute renal failure   Bilateral hydronephrosis   Anemia of chronic disease   Prostate cancer metastatic to multiple sites   UTI (lower urinary tract infection)   Malnutrition of moderate degree   Bacteremia   Pyrexia   Discharge Condition: Stable  Diet recommendation: Regular  Filed Weights   05/23/14 1328  Weight: 78.926 kg (174 lb)    History of present illness:  69 y.o. male  This is a 69 year old man who had onset of cloudy urine for the past several days. He also apparently had low-grade fevers in the nursing home that he currently resides in. He denies any abdominal pain, nausea, vomiting, chest pain, dyspnea, diarrhea. He has not had altered mental status. This patient has stage IV metastatic state cancer and also history of hydronephrosis, status post bilateral nephrostomy tubes which were changed and late December 2015. He has been receiving chemotherapy from oncology. Evaluation in the emergency room shows him to have a fever, resting tachycardia and urine consistent with UTI. He is now being admitted for further management.  Hospital Course:  Sepsis due to Escherichia coli bacteremia: - On admission to the hospital start on IV Rocephin, blood cultures and urine cultures were ordered. - Blood cultures are positive for Escherichia coli, sensitivities came back Escherichia coli sensitive to ciprofloxacin. - As recommended by urology Nephrostomy tubes  exchange as recommended by urology.  Acute on chronic renal disease: - Most likely prerenal in etiology. - He was treated with IV fluids with slow improvement. He'll need a basic metabolic panel in a week. - Baseline creatinine 1.4.   Bilateral hydronephrosis: - Discussed post replacement by IR.  Anemia of chronic disease: - Hemoglobin currently at baseline most likely due to renal dysfunction.  Hyponatremia: - Most likely decrease to hypovolemia does resolve with hydration.   Procedures:  Exchange of bilateral nephrostomy tubes.  Consultations:  none  Discharge Exam: Filed Vitals:   05/26/14 0703  BP: 92/63  Pulse: 86  Temp: 97.8 F (36.6 C)  Resp: 20    General: Awake alert and oriented 3 Cardiovascular: Regular rate and rhythm Respiratory: Good air movement clear to auscultation  Discharge Instructions   Discharge Instructions    Diet - low sodium heart healthy    Complete by:  As directed      Increase activity slowly    Complete by:  As directed           Current Discharge Medication List    START taking these medications   Details  ciprofloxacin (CIPRO) 500 MG tablet Take 1 tablet (500 mg total) by mouth 2 (two) times daily. Qty: 28 tablet, Refills: 0      CONTINUE these medications which have CHANGED   Details  oxyCODONE (OXY IR/ROXICODONE) 5 MG immediate release tablet Take 2 tablets (10 mg total) by mouth every 4 (four) hours. Up to 2 tablets every 4 hours to control pain. Qty: 60 tablet, Refills: 0   Associated Diagnoses: Prostate cancer metastatic to multiple sites;  Bone lesion      CONTINUE these medications which have NOT CHANGED   Details  calcium-vitamin D (OSCAL WITH D) 500-200 MG-UNIT per tablet Take 2 tablets by mouth daily with breakfast. Qty: 60 tablet, Refills: 11   Associated Diagnoses: Bone metastases    dexamethasone (DECADRON) 4 MG tablet The day before, day of, and day after chemo take 2 tabs in the am and 2 tabs in the  pm. (Take with food). Qty: 36 tablet, Refills: 1   Associated Diagnoses: Prostate cancer metastatic to multiple sites    lidocaine-prilocaine (EMLA) cream Apply a quarter size amount to port site 1 hour prior to chemo. Do not rub in. Cover with plastic wrap. Qty: 30 g, Refills: 3   Associated Diagnoses: Prostate cancer metastatic to multiple sites    loperamide (IMODIUM A-D) 2 MG tablet After first loose stool take 2 tabs then take 1 tab every 2 hours as needed for loose stools. Call if diarrhea persists more than 12 hours. Qty: 30 tablet, Refills: 1   Associated Diagnoses: Prostate cancer metastatic to multiple sites    metoCLOPramide (REGLAN) 5 MG tablet The day after chemo take 1 tab four times a day x 2 days. Then may take 1 tab four times a day if needed for nausea/vomiting. Qty: 60 tablet, Refills: 2   Associated Diagnoses: Prostate cancer metastatic to multiple sites    prochlorperazine (COMPAZINE) 10 MG tablet The day after chemo take 1 tab four times a day x 2 days. Then may take 1 tab four times a day if needed for nausea/vomiting. Qty: 60 tablet, Refills: 2   Associated Diagnoses: Prostate cancer metastatic to multiple sites    traMADol (ULTRAM) 50 MG tablet Take 1 tablet (50 mg total) by mouth every 6 (six) hours as needed. Qty: 30 tablet, Refills: 0    acetaminophen (TYLENOL) 325 MG tablet Take 2 tablets (650 mg total) by mouth every 6 (six) hours as needed for mild pain (or Fever >/= 101).       No Known Allergies    The results of significant diagnostics from this hospitalization (including imaging, microbiology, ancillary and laboratory) are listed below for reference.    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 He has had bilateral percutaneous nephrostomy placed 01/28/2014. He has been receiving routine exchange is.   Within the last 24 hr the right side has been accidentally displaced/ withdrawn. There is decreased urine output and he has been referred for evaluation and potential exchange.  EXAM: IR EXCHANGE NEPHROSTOMY RIGHT  FLUOROSCOPIC RESCUE OF RIGHT-SIDED PERCUTANEOUS NEPHROSTOMY TUBE  MEDICATIONS: None  CONTRAST:  40mL OMNIPAQUE IOHEXOL 300 MG/ML  SOLN  FLUOROSCOPY TIME:  Seconds & minutes  PROCEDURE: The procedure, risks, benefits, and alternatives were explained to the patient. Questions regarding the procedure were encouraged and answered. The patient understands and consents to the procedure.  Scout images of the upper abdomen performed once the patient was positioned in prone position on the fluoroscopy table.  The right-sided nephrostomy tube was prepped with Betadine in a sterile fashion, and a sterile drape was applied covering the operative field. A sterile gown and sterile gloves were used for the procedure. Local anesthesia was provided with 1% Lidocaine.  Small amount of contrast was infused through the catheter, identifying a patent tract from the site pole of the nephrostomy catheter into the collecting system and the  proximal right ureter.  A combination of a 4 French catheter, a Glidewire, and the plastic inner stiffener from a 10 [place] percutaneous nephrostomy tube were used to navigate the indwelling catheter and wire into the right collecting system.  Once we confirmed are wire position within the right ureter, the old 59 French percutaneous nephrostomy catheter was exchanged for a new 10 French percutaneous nephrostomy catheter.  The loop was formed in the collecting system and again a small amount of contrast confirmed the position. The catheter was sutured in position at the skin.  Patient tolerated the procedure well and remained hemodynamically stable throughout.  No complications were encountered and no significant blood loss was encounter  COMPLICATIONS: None  FINDINGS: Initial scout image  demonstrates withdrawal of the indwelling percutaneous nephrostomy catheter with the pigtail tightly looped at the inferior margin of the right kidney silhouette.  Contrast injection confirms that the catheter has been withdrawn from the collecting system.  Final image demonstrates replacement of a new 10 French catheter into the collecting system after rescue of the tract as above.  IMPRESSION: Status post fluoroscopic assisted rescue and exchange of displaced right-sided percutaneous nephrostomy catheter.  Signed,  Dulcy Fanny. Earleen Newport, DO  [facility]  [facility]  PLAN: The patient may resume his usual schedule for routine exchange is of the catheters, with anticipated exchange of bilateral catheters at the next appointment to reestablish simultaneous exchange schedule.   Electronically Signed   By: Corrie Mckusick D.O.   On: 05/24/2014 12:50    Microbiology: Recent Results (from the past 240 hour(s))  Blood culture (routine x 2)     Status: None (Preliminary result)   Collection Time: 05/23/14  1:39 PM  Result Value Ref Range Status   Specimen Description BLOOD LEFT ANTECUBITAL  Final   Special Requests BOTTLES DRAWN AEROBIC ONLY 4CC  Final   Culture NO GROWTH 3 DAYS  Final   Report Status PENDING  Incomplete  Urine culture     Status: None   Collection Time: 05/23/14  2:25 PM  Result Value Ref Range Status   Specimen Description URINE, CATHETERIZED  Final   Special Requests NONE  Final   Colony Count   Final    >=100,000 COLONIES/ML Performed at [facility]    Culture   Final    Multiple bacterial morphotypes present, none predominant. Suggest appropriate recollection if clinically indicated. Performed at [facility]    Report Status 05/24/2014 FINAL  Final  Blood culture (routine x 2)     Status: None   Collection Time: 05/23/14  3:40 PM  Result Value Ref Range Status   Specimen Description BLOOD RIGHT ARM DRAWN BY RN GM   Final   Special Requests BOTTLES DRAWN AEROBIC AND ANAEROBIC 6CC  Final   Culture   Final    ESCHERICHIA COLI Note: Gram Stain Report Called to,Read Back By and Verified With:  J MAYS 0714 05/24/14 BY Freddy Finner Performed at [facility] Performed at [facility]    Report Status 05/26/2014 FINAL  Final   Organism ID, Bacteria ESCHERICHIA COLI  Final      Susceptibility   Escherichia coli - MIC*    AMPICILLIN <=2 SENSITIVE Sensitive     AMPICILLIN/SULBACTAM <=2 SENSITIVE Sensitive     CEFAZOLIN <=4 SENSITIVE Sensitive     CEFEPIME <=1 SENSITIVE Sensitive     CEFTAZIDIME <=1 SENSITIVE Sensitive     CEFTRIAXONE <=1 SENSITIVE Sensitive  CIPROFLOXACIN <=0.25 SENSITIVE Sensitive     GENTAMICIN <=1 SENSITIVE Sensitive     IMIPENEM <=0.25 SENSITIVE Sensitive     PIP/TAZO <=4 SENSITIVE Sensitive     TOBRAMYCIN <=1 SENSITIVE Sensitive     TRIMETH/SULFA <=20 SENSITIVE Sensitive     * ESCHERICHIA COLI     Labs: Basic Metabolic Panel:  Recent Labs Lab 05/23/14 1339 05/24/14 0509 05/25/14 1249  NA 134* 134* 136  K 4.3 4.2 3.9  CL 103 108 108  CO2 21 18* 19  GLUCOSE 130* 129* 135*  BUN 51* 43* 33*  CREATININE 3.52* 2.92* 2.48*  CALCIUM 8.1* 7.3* 7.3*   Liver Function Tests:  Recent Labs Lab 05/24/14 0509  AST 218*  ALT 149*  ALKPHOS 163*  BILITOT 0.3  PROT 7.1  ALBUMIN 2.4*   No results for input(s): LIPASE, AMYLASE in the last 168 hours. No results for input(s): AMMONIA in the last 168 hours. CBC:  Recent Labs Lab 05/23/14 1339 05/24/14 0509  WBC 11.2* 15.1*  HGB 9.6* 8.7*  HCT 28.7* 26.0*  MCV 89.1 88.7  PLT 405* 427*   Cardiac Enzymes: No results for input(s): CKTOTAL, CKMB, CKMBINDEX, TROPONINI in the last 168 hours. BNP: BNP (last 3 results) No results for input(s): PROBNP in the last 8760 hours. CBG: No results for input(s): GLUCAP in the last 168 hours.   Signed:  Charlynne Cousins  [facility] 05/26/2014, 10:34  AM    \

## 2014-05-27 ENCOUNTER — Telehealth: Payer: Self-pay | Admitting: *Deleted

## 2014-05-27 NOTE — Telephone Encounter (Signed)
Pt aware of results 

## 2014-05-27 NOTE — Telephone Encounter (Signed)
-----   Message from [name], MD sent at 05/27/2014  1:57 PM EST ----- Negative blood culture

## 2014-05-28 ENCOUNTER — Other Ambulatory Visit (HOSPITAL_COMMUNITY): Payer: Self-pay | Admitting: Oncology

## 2014-05-28 DIAGNOSIS — C61 Malignant neoplasm of prostate: Secondary | ICD-10-CM

## 2014-05-28 LAB — CULTURE, BLOOD (ROUTINE X 2): Culture: NO GROWTH

## 2014-05-29 ENCOUNTER — Encounter (HOSPITAL_COMMUNITY): Payer: Medicare Other | Attending: Hematology and Oncology

## 2014-05-29 ENCOUNTER — Encounter (HOSPITAL_COMMUNITY): Payer: Self-pay

## 2014-05-29 ENCOUNTER — Encounter (HOSPITAL_COMMUNITY): Payer: Medicare Other | Attending: Hematology & Oncology

## 2014-05-29 DIAGNOSIS — Z87898 Personal history of other specified conditions: Secondary | ICD-10-CM | POA: Diagnosis not present

## 2014-05-29 DIAGNOSIS — C7951 Secondary malignant neoplasm of bone: Secondary | ICD-10-CM | POA: Diagnosis not present

## 2014-05-29 DIAGNOSIS — C61 Malignant neoplasm of prostate: Secondary | ICD-10-CM | POA: Insufficient documentation

## 2014-05-29 DIAGNOSIS — J449 Chronic obstructive pulmonary disease, unspecified: Secondary | ICD-10-CM | POA: Insufficient documentation

## 2014-05-29 DIAGNOSIS — C779 Secondary and unspecified malignant neoplasm of lymph node, unspecified: Secondary | ICD-10-CM | POA: Diagnosis not present

## 2014-05-29 DIAGNOSIS — M899 Disorder of bone, unspecified: Secondary | ICD-10-CM

## 2014-05-29 LAB — COMPREHENSIVE METABOLIC PANEL
ALT: 52 U/L (ref 0–53)
ANION GAP: 9 (ref 5–15)
AST: 40 U/L — ABNORMAL HIGH (ref 0–37)
Albumin: 3 g/dL — ABNORMAL LOW (ref 3.5–5.2)
Alkaline Phosphatase: 127 U/L — ABNORMAL HIGH (ref 39–117)
BUN: 27 mg/dL — AB (ref 6–23)
CALCIUM: 9.3 mg/dL (ref 8.4–10.5)
CO2: 20 mmol/L (ref 19–32)
Chloride: 107 mmol/L (ref 96–112)
Creatinine, Ser: 1.91 mg/dL — ABNORMAL HIGH (ref 0.50–1.35)
GFR calc non Af Amer: 35 mL/min — ABNORMAL LOW (ref >90)
GFR, EST AFRICAN AMERICAN: 40 mL/min — AB (ref >90)
Glucose, Bld: 89 mg/dL (ref 70–99)
Potassium: 5.2 mmol/L — ABNORMAL HIGH (ref 3.5–5.1)
Sodium: 136 mmol/L (ref 135–145)
Total Bilirubin: 0.4 mg/dL (ref 0.3–1.2)
Total Protein: 8.2 g/dL (ref 6.0–8.3)

## 2014-05-29 LAB — CBC WITH DIFFERENTIAL/PLATELET
Basophils Absolute: 0 10*3/uL (ref 0.0–0.1)
Basophils Relative: 0 % (ref 0–1)
Eosinophils Absolute: 0 10*3/uL (ref 0.0–0.7)
Eosinophils Relative: 0 % (ref 0–5)
HCT: 26.5 % — ABNORMAL LOW (ref 39.0–52.0)
Hemoglobin: 8.7 g/dL — ABNORMAL LOW (ref 13.0–17.0)
LYMPHS ABS: 1.7 10*3/uL (ref 0.7–4.0)
LYMPHS PCT: 7 % — AB (ref 12–46)
MCH: 29.8 pg (ref 26.0–34.0)
MCHC: 32.8 g/dL (ref 30.0–36.0)
MCV: 90.8 fL (ref 78.0–100.0)
MONOS PCT: 4 % (ref 3–12)
Monocytes Absolute: 0.9 10*3/uL (ref 0.1–1.0)
NEUTROS ABS: 20.7 10*3/uL — AB (ref 1.7–7.7)
NEUTROS PCT: 89 % — AB (ref 43–77)
PLATELETS: 635 10*3/uL — AB (ref 150–400)
RBC: 2.92 MIL/uL — ABNORMAL LOW (ref 4.22–5.81)
RDW: 18.2 % — ABNORMAL HIGH (ref 11.5–15.5)
WBC: 23.3 10*3/uL — AB (ref 4.0–10.5)

## 2014-05-29 MED ORDER — DENOSUMAB 120 MG/1.7ML ~~LOC~~ SOLN
120.0000 mg | Freq: Once | SUBCUTANEOUS | Status: AC
  Administered 2014-05-29: 120 mg via SUBCUTANEOUS
  Filled 2014-05-29: qty 1.7

## 2014-05-29 NOTE — Progress Notes (Signed)
Adriana Reams presents today for injection per MD orders. xgeva 120 mg administered SQ in left Abdomen. Verbalizes that he is taking calcium with vitamin D twice daily as prescribed Administration without incident. Patient tolerated well.

## 2014-05-29 NOTE — Progress Notes (Signed)
LABS FOR CMP,CBCD 

## 2014-06-03 ENCOUNTER — Ambulatory Visit (HOSPITAL_COMMUNITY)
Admission: RE | Admit: 2014-06-03 | Discharge: 2014-06-03 | Disposition: A | Discharge status: Home / Self Care | Payer: Medicare Other | Source: Ambulatory Visit | Attending: Urology | Admitting: Urology

## 2014-06-03 ENCOUNTER — Other Ambulatory Visit: Payer: Self-pay | Admitting: Urology

## 2014-06-03 DIAGNOSIS — C61 Malignant neoplasm of prostate: Secondary | ICD-10-CM

## 2014-06-03 DIAGNOSIS — N135 Crossing vessel and stricture of ureter without hydronephrosis: Secondary | ICD-10-CM | POA: Diagnosis not present

## 2014-06-03 DIAGNOSIS — Z436 Encounter for attention to other artificial openings of urinary tract: Secondary | ICD-10-CM | POA: Diagnosis not present

## 2014-06-03 MED ORDER — IOHEXOL 300 MG/ML  SOLN
20.0000 mL | Freq: Once | INTRAMUSCULAR | Status: AC | PRN
  Administered 2014-06-03: 10 mL

## 2014-06-03 MED ORDER — LIDOCAINE HCL 1 % IJ SOLN
INTRAMUSCULAR | Status: AC
  Filled 2014-06-03: qty 20

## 2014-06-03 NOTE — Procedures (Signed)
Procedure:  Bilateral nephrostomy tube changes New 10 Fr PCN's placed over wires.  Attached to new gravity bags.

## 2014-06-05 ENCOUNTER — Encounter (HOSPITAL_BASED_OUTPATIENT_CLINIC_OR_DEPARTMENT_OTHER): Payer: Medicare Other

## 2014-06-05 ENCOUNTER — Encounter (HOSPITAL_COMMUNITY): Payer: Self-pay

## 2014-06-05 ENCOUNTER — Encounter (HOSPITAL_BASED_OUTPATIENT_CLINIC_OR_DEPARTMENT_OTHER): Payer: Medicare Other | Admitting: Hematology & Oncology

## 2014-06-05 DIAGNOSIS — M899 Disorder of bone, unspecified: Secondary | ICD-10-CM

## 2014-06-05 DIAGNOSIS — N189 Chronic kidney disease, unspecified: Secondary | ICD-10-CM | POA: Diagnosis not present

## 2014-06-05 DIAGNOSIS — C7951 Secondary malignant neoplasm of bone: Secondary | ICD-10-CM

## 2014-06-05 DIAGNOSIS — N1330 Unspecified hydronephrosis: Secondary | ICD-10-CM

## 2014-06-05 DIAGNOSIS — C61 Malignant neoplasm of prostate: Secondary | ICD-10-CM | POA: Diagnosis not present

## 2014-06-05 DIAGNOSIS — Z87898 Personal history of other specified conditions: Secondary | ICD-10-CM | POA: Diagnosis not present

## 2014-06-05 DIAGNOSIS — Z5111 Encounter for antineoplastic chemotherapy: Secondary | ICD-10-CM

## 2014-06-05 DIAGNOSIS — C779 Secondary and unspecified malignant neoplasm of lymph node, unspecified: Secondary | ICD-10-CM | POA: Diagnosis not present

## 2014-06-05 DIAGNOSIS — D638 Anemia in other chronic diseases classified elsewhere: Secondary | ICD-10-CM

## 2014-06-05 DIAGNOSIS — C772 Secondary and unspecified malignant neoplasm of intra-abdominal lymph nodes: Secondary | ICD-10-CM | POA: Diagnosis not present

## 2014-06-05 DIAGNOSIS — D631 Anemia in chronic kidney disease: Secondary | ICD-10-CM

## 2014-06-05 DIAGNOSIS — J449 Chronic obstructive pulmonary disease, unspecified: Secondary | ICD-10-CM | POA: Diagnosis not present

## 2014-06-05 DIAGNOSIS — D630 Anemia in neoplastic disease: Secondary | ICD-10-CM | POA: Diagnosis not present

## 2014-06-05 DIAGNOSIS — N179 Acute kidney failure, unspecified: Secondary | ICD-10-CM

## 2014-06-05 LAB — CBC WITH DIFFERENTIAL/PLATELET
BASOS ABS: 0 10*3/uL (ref 0.0–0.1)
Basophils Relative: 0 % (ref 0–1)
EOS ABS: 0 10*3/uL (ref 0.0–0.7)
EOS PCT: 0 % (ref 0–5)
HCT: 27.6 % — ABNORMAL LOW (ref 39.0–52.0)
Hemoglobin: 8.9 g/dL — ABNORMAL LOW (ref 13.0–17.0)
Lymphocytes Relative: 7 % — ABNORMAL LOW (ref 12–46)
Lymphs Abs: 0.6 10*3/uL — ABNORMAL LOW (ref 0.7–4.0)
MCH: 30 pg (ref 26.0–34.0)
MCHC: 32.2 g/dL (ref 30.0–36.0)
MCV: 92.9 fL (ref 78.0–100.0)
Monocytes Absolute: 0.3 10*3/uL (ref 0.1–1.0)
Monocytes Relative: 3 % (ref 3–12)
Neutro Abs: 7.2 10*3/uL (ref 1.7–7.7)
Neutrophils Relative %: 90 % — ABNORMAL HIGH (ref 43–77)
Platelets: 441 10*3/uL — ABNORMAL HIGH (ref 150–400)
RBC: 2.97 MIL/uL — AB (ref 4.22–5.81)
RDW: 17.5 % — AB (ref 11.5–15.5)
WBC: 8.1 10*3/uL (ref 4.0–10.5)

## 2014-06-05 LAB — COMPREHENSIVE METABOLIC PANEL
ALT: 27 U/L (ref 0–53)
AST: 27 U/L (ref 0–37)
Albumin: 3.1 g/dL — ABNORMAL LOW (ref 3.5–5.2)
Alkaline Phosphatase: 106 U/L (ref 39–117)
Anion gap: 6 (ref 5–15)
BILIRUBIN TOTAL: 0.3 mg/dL (ref 0.3–1.2)
BUN: 20 mg/dL (ref 6–23)
CHLORIDE: 104 mmol/L (ref 96–112)
CO2: 24 mmol/L (ref 19–32)
Calcium: 9.1 mg/dL (ref 8.4–10.5)
Creatinine, Ser: 1.74 mg/dL — ABNORMAL HIGH (ref 0.50–1.35)
GFR, EST AFRICAN AMERICAN: 45 mL/min — AB (ref >90)
GFR, EST NON AFRICAN AMERICAN: 39 mL/min — AB (ref >90)
GLUCOSE: 147 mg/dL — AB (ref 70–99)
POTASSIUM: 4.7 mmol/L (ref 3.5–5.1)
SODIUM: 134 mmol/L — AB (ref 135–145)
Total Protein: 7.9 g/dL (ref 6.0–8.3)

## 2014-06-05 LAB — TESTOSTERONE: Testosterone: <10 ng/dL — ABNORMAL LOW (ref 300–890)

## 2014-06-05 MED ORDER — SODIUM CHLORIDE 0.9 % IV SOLN
Freq: Once | INTRAVENOUS | Status: AC
  Administered 2014-06-05: 11:00:00 via INTRAVENOUS

## 2014-06-05 MED ORDER — DOCETAXEL CHEMO INJECTION 160 MG/16ML
75.0000 mg/m2 | Freq: Once | INTRAVENOUS | Status: AC
  Administered 2014-06-05: 140 mg via INTRAVENOUS
  Filled 2014-06-05: qty 14

## 2014-06-05 MED ORDER — DEXAMETHASONE SODIUM PHOSPHATE 10 MG/ML IJ SOLN: 10.0000 mg | Freq: Once | INTRAMUSCULAR | Status: DC

## 2014-06-05 MED ORDER — OXYCODONE HCL 5 MG PO TABS: 10.0000 mg | ORAL_TABLET | ORAL | Status: DC

## 2014-06-05 MED ORDER — HEPARIN SOD (PORK) LOCK FLUSH 100 UNIT/ML IV SOLN
500.0000 [IU] | Freq: Once | INTRAVENOUS | Status: AC | PRN
  Administered 2014-06-05: 500 [IU]
  Filled 2014-06-05: qty 5

## 2014-06-05 MED ORDER — SODIUM CHLORIDE 0.9 % IV SOLN: 8.0000 mg | Freq: Once | INTRAVENOUS | Status: DC

## 2014-06-05 MED ORDER — SODIUM CHLORIDE 0.9 % IV SOLN
Freq: Once | INTRAVENOUS | Status: AC
  Administered 2014-06-05: 8 mg via INTRAVENOUS
  Filled 2014-06-05: qty 4

## 2014-06-05 MED ORDER — SODIUM CHLORIDE 0.9 % IJ SOLN: 10.0000 mL | INTRAMUSCULAR | Status: DC | PRN

## 2014-06-05 NOTE — Patient Instructions (Addendum)
[facility] Discharge Instructions for Patients Receiving Chemotherapy  Today you received the following chemotherapy agents: taxotere  To help prevent nausea and vomiting after your treatment, we encourage you to take your nausea medication .   If you develop nausea and vomiting that is not controlled by your nausea medication, call the clinic. If it is after clinic hours your family physician or the after hours number for the clinic or go to the Emergency Department.   BELOW ARE SYMPTOMS THAT SHOULD BE REPORTED IMMEDIATELY:  *FEVER GREATER THAN 101.0 F  *CHILLS WITH OR WITHOUT FEVER  NAUSEA AND VOMITING THAT IS NOT CONTROLLED WITH YOUR NAUSEA MEDICATION  *UNUSUAL SHORTNESS OF BREATH  *UNUSUAL BRUISING OR BLEEDING  TENDERNESS IN MOUTH AND THROAT WITH OR WITHOUT PRESENCE OF ULCERS  *URINARY PROBLEMS  *BOWEL PROBLEMS  UNUSUAL RASH Items with * indicate a potential emergency and should be followed up as soon as possible.  One of the nurses will contact you 24 hours after your treatment. Please let the nurse know about any problems that you may have experienced. Feel free to call the clinic you have any questions or concerns. The clinic phone number is [phone].   I have been informed and understand all the instructions given to me. I know to contact the clinic, my physician, or go to the Emergency Department if any problems should occur. I do not have any questions at this time, but understand that I may call the clinic during office hours or the Patient Navigator at [phone] should I have any questions or need assistance in obtaining follow up care.

## 2014-06-05 NOTE — Patient Instructions (Signed)
[place] at [facility]  Discharge Instructions:  Follow up visit in 3 weeks with chemotherapy and labs.  We will set up scans at your next follow up visit.  Call the clinic if you have any questions or concerns.  _______________________________________________________________  Thank you for choosing [place] at [facility] to provide your oncology and hematology care.  To afford each patient quality time with our providers, please arrive at least 15 minutes before your scheduled appointment.  You need to re-schedule your appointment if you arrive 10 or more minutes late.  We strive to give you quality time with our providers, and arriving late affects you and other patients whose appointments are after yours.  Also, if you no show three or more times for appointments you may be dismissed from the clinic.  Again, thank you for choosing [place] [facility] [place] hope is that these requests will allow you access to exceptional care and in a timely manner. _______________________________________________________________  If you have questions after your visit, please contact our office at [phone] between the hours of 8:30 a.m. and 5:00 p.m. Voicemails left after 4:30 p.m. will not be returned until the following business day. _______________________________________________________________  For prescription refill requests, have your pharmacy contact our office. _______________________________________________________________  Recommendations made by the consultant and any test results will be sent to your referring physician. _______________________________________________________________

## 2014-06-05 NOTE — Progress Notes (Signed)
[name], MD [place] [address]  Stage IV Adenocarcinoma of the prostate, Hormone Sensitive Disease Bilateral orchiectomy Taxotere/Xgeva started 02/2014 Bilateral nephrostomy tube placement 01/2014  CURRENT THERAPY:Taxotere/Xgeva  INTERVAL HISTORY: Jeff [name] 69 y.o. male returns for follow-up of his hormone sensitive stage IV prostate cancer. He is on Taxotere. His appetite is good. He denies nausea or vomiting. He says his energy is good. He feels a need to urinate and states he does so daily.  He just had his nephrostomy tubes replaced. He has not problems.  MEDICAL HISTORY: Past Medical History  Diagnosis Date  . Acute renal failure 01/26/2014  . Anemia of chronic disease 01/26/2014  . Bilateral hydronephrosis 01/26/2014  . History of cocaine abuse 01/26/2014  . Homelessness 01/31/2014  . 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 Alcohol abuse   . UTI (lower urinary tract infection)   . Sepsis     has Bone lesion; Acute renal failure; Bilateral hydronephrosis; Anemia of chronic disease; History of cocaine abuse; Prostate cancer metastatic to multiple sites; Homelessness; Health care maintenance; Sepsis; UTI (lower urinary tract infection); Malnutrition of moderate degree; Bacteremia; and Pyrexia on his problem list.      Prostate cancer metastatic to multiple sites   01/26/2014 Tumor Marker PSA > 5000   01/28/2014 Initial Biopsy Metastatic adenocarcinoma of prostate   02/05/2014 Surgery Bilateral orchiectomy by Dr. Junious Silk   02/26/2014 Tumor Marker PSA = 399.5   03/14/2014 Tumor Marker PSA= 89.51   03/14/2014 -  Chemotherapy Docetaxel 75 mg/kg every 21 days   04/24/2014 Tumor Marker PSA- 19.89     has No Known Allergies.  [name] had no medications administered during this visit.  SURGICAL HISTORY: Past Surgical History  Procedure Laterality Date  . Orchiectomy Bilateral 02/05/2014    Procedure: BILATERAL  ORCHIECTOMY;  Surgeon: Festus Aloe, MD;  Location: [facility];  Service: Urology;  Laterality: Bilateral;  . Portacath placement Right 03/12/14  . Percutaneous nephrostomy Bilateral     IR Dr. Junious Silk changed on 04/22/2014    SOCIAL HISTORY: History   Social History  . Marital Status: Single    Spouse Name: N/A  . Number of Children: 1  . Years of Education: 9th   Occupational History  . disabilty    Social History Main Topics  . Smoking status: Current Every Day Smoker -- 0.25 packs/day for 55 years    Types: Cigarettes  . Smokeless tobacco: Never Used  . Alcohol Use: No     Comment: none for at least 7 -8 years  . Drug Use: Yes    Special: Cocaine     Comment: last used Cocaine 01/25/14  . Sexual Activity: Yes   Other Topics Concern  . Not on file   Social History Narrative   Lives at [employer] 9th grade   1 son, lives in [place], [place]   Can read and write in native language             FAMILY HISTORY: Family History  Problem Relation Age of Onset  . Cancer Brother 60    Review of Systems  Constitutional: Negative for fever, chills, weight loss and malaise/fatigue.  HENT: Negative for congestion, hearing loss, nosebleeds, sore throat and tinnitus.   Eyes: Negative for blurred vision, double vision, pain and discharge.  Respiratory: Negative for cough, hemoptysis, sputum production, shortness of breath and wheezing.   Cardiovascular: Negative for chest pain,  palpitations, claudication, leg swelling and PND.  Gastrointestinal: Negative for heartburn, nausea, vomiting, abdominal pain, diarrhea, constipation, blood in stool and melena.  Genitourinary: Negative for dysuria, urgency, frequency and hematuria.       Denies pain at nephrostomy tube sites  Musculoskeletal: Negative for myalgias, joint pain and falls.  Skin: Negative for itching and rash.  Neurological: Negative for dizziness, tingling, tremors, sensory change, speech change,  focal weakness, seizures, loss of consciousness, weakness and headaches.  Endo/Heme/Allergies: Does not bruise/bleed easily.  Psychiatric/Behavioral: Negative for depression, suicidal ideas, memory loss and substance abuse. The patient is not nervous/anxious and does not have insomnia.     PHYSICAL EXAMINATION  ECOG PERFORMANCE STATUS: 0 - Asymptomatic  There were no vitals filed for this visit.  Physical Exam  Constitutional: He is oriented to person, place, and time and well-developed, well-nourished, and in no distress.  HENT:  Head: Normocephalic and atraumatic.  Nose: Nose normal.  Mouth/Throat: Oropharynx is clear and moist. No oropharyngeal exudate.  Eyes: Conjunctivae and EOM are normal. Pupils are equal, round, and reactive to light. Right eye exhibits no discharge. Left eye exhibits no discharge. No scleral icterus.  Neck: Normal range of motion. Neck supple. No tracheal deviation present. No thyromegaly present.  Cardiovascular: Normal rate, regular rhythm and normal heart sounds.  Exam reveals no gallop and no friction rub.   No murmur heard. Pulmonary/Chest: Effort normal and breath sounds normal. He has no wheezes. He has no rales.  Abdominal: Soft. Bowel sounds are normal. He exhibits no distension and no mass. There is no tenderness. There is no rebound and no guarding.  Genitourinary:  Bilateral Nephrostomy tubes, bandaged, C/D/I  Musculoskeletal: Normal range of motion. He exhibits no edema.  Lymphadenopathy:    He has no cervical adenopathy.  Neurological: He is alert and oriented to person, place, and time. He has normal reflexes. No cranial nerve deficit. Gait normal. Coordination normal.  Skin: Skin is warm and dry. No rash noted.  Psychiatric: Mood, memory, affect and judgment normal.  Nursing note and vitals reviewed.   LABORATORY DATA:  CBC    Component Value Date/Time   WBC 8.1 06/05/2014 1015   RBC 2.97* 06/05/2014 1015   HGB 8.9* 06/05/2014 1015    HCT 27.6* 06/05/2014 1015   PLT 441* 06/05/2014 1015   MCV 92.9 06/05/2014 1015   MCH 30.0 06/05/2014 1015   MCHC 32.2 06/05/2014 1015   RDW 17.5* 06/05/2014 1015   LYMPHSABS 0.6* 06/05/2014 1015   MONOABS 0.3 06/05/2014 1015   EOSABS 0.0 06/05/2014 1015   BASOSABS 0.0 06/05/2014 1015   CMP     Component Value Date/Time   NA 134* 06/05/2014 1015   K 4.7 06/05/2014 1015   CL 104 06/05/2014 1015   CO2 24 06/05/2014 1015   GLUCOSE 147* 06/05/2014 1015   BUN 20 06/05/2014 1015   CREATININE 1.74* 06/05/2014 1015   CREATININE 1.39* 02/06/2014 1012   CALCIUM 9.1 06/05/2014 1015   PROT 7.9 06/05/2014 1015   ALBUMIN 3.1* 06/05/2014 1015   AST 27 06/05/2014 1015   ALT 27 06/05/2014 1015   ALKPHOS 106 06/05/2014 1015   BILITOT 0.3 06/05/2014 1015   GFRNONAA 39* 06/05/2014 1015   GFRAA 45* 06/05/2014 1015     ASSESSMENT and THERAPY PLAN:    Prostate cancer metastatic to multiple sites Present 69 year old male with stage IV adenocarcinoma of the prostate, hormone sensitive disease. He is currently on single agent Taxotere. He is also on Xgeva for  bony metastatic disease. He is overall doing well he has had little difficulties tolerating treatment. Plan will be for total of 6 cycles of therapy. If he has interim problems prior to his next follow-up he is advised to call.   Anemia of chronic disease He has anemia which is worsening and most likely secondary to his underlying chemotherapy. He also has chronic kidney disease, and his stage IV malignancy certainly contribute to his anemia. I recommended ongoing observation. B12 levels are normal. Iron studies are suggestive and consistent with anemia of chronic disease. He can consider adding folic acid at his next follow-up. I discussed with Adorian if his counts become any lower we may need to consider transfusion. We could also consider adding ESA's, but I would prefer to wait until he has completed therapy to see how his counts  normalize.     All questions were answered. The patient knows to call the clinic with any problems, questions or concerns. We can certainly see the patient much sooner if necessary. Molli Hazard 06/24/2014

## 2014-06-05 NOTE — Progress Notes (Signed)
Jeff [name] Tolerated chemotherapy well today.  Discharged ambulatory

## 2014-06-06 LAB — TESTOSTERONE, FREE

## 2014-06-06 LAB — PSA: PSA: 23.59 ng/mL — AB (ref <=4.00)

## 2014-06-06 LAB — TESTOSTERONE, % FREE

## 2014-06-07 ENCOUNTER — Encounter (HOSPITAL_BASED_OUTPATIENT_CLINIC_OR_DEPARTMENT_OTHER): Payer: Medicare Other

## 2014-06-07 DIAGNOSIS — Z5189 Encounter for other specified aftercare: Secondary | ICD-10-CM

## 2014-06-07 DIAGNOSIS — C61 Malignant neoplasm of prostate: Secondary | ICD-10-CM

## 2014-06-07 DIAGNOSIS — C772 Secondary and unspecified malignant neoplasm of intra-abdominal lymph nodes: Secondary | ICD-10-CM

## 2014-06-07 DIAGNOSIS — C7951 Secondary malignant neoplasm of bone: Secondary | ICD-10-CM

## 2014-06-07 MED ORDER — PEGFILGRASTIM INJECTION 6 MG/0.6ML ~~LOC~~
PREFILLED_SYRINGE | SUBCUTANEOUS | Status: AC
  Filled 2014-06-07: qty 0.6

## 2014-06-07 MED ORDER — PEGFILGRASTIM INJECTION 6 MG/0.6ML ~~LOC~~
6.0000 mg | PREFILLED_SYRINGE | Freq: Once | SUBCUTANEOUS | Status: AC
  Administered 2014-06-07: 6 mg via SUBCUTANEOUS

## 2014-06-07 NOTE — Patient Instructions (Signed)
[facility] at [facility] Discharge Instructions  RECOMMENDATIONS MADE BY THE CONSULTANT AND ANY TEST RESULTS WILL BE SENT TO YOUR REFERRING PHYSICIAN. Today you received your neulasta. Please follow up as scheduled.   Thank you for choosing [facility] at [facility] to provide your oncology and hematology care.  To afford each patient quality time with our provider, please arrive at least 15 minutes before your scheduled appointment time.    You need to re-schedule your appointment should you arrive 10 or more minutes late.  We strive to give you quality time with our providers, and arriving late affects you and other patients whose appointments are after yours.  Also, if you no show three or more times for appointments you may be dismissed from the clinic at the providers discretion.     Again, thank you for choosing [facility].  Our hope is that these requests will decrease the amount of time that you wait before being seen by our physicians.       _____________________________________________________________  Should you have questions after your visit to  Cancer Center, please contact our office at [phone] between the hours of 8:30 a.m. and 4:30 p.m.  Voicemails left after 4:30 p.m. will not be returned until the following business day.  For prescription refill requests, have your pharmacy contact our office.    

## 2014-06-07 NOTE — Progress Notes (Signed)
Jeff [name] presents today for injection per the provider's orders.  Neulasta administration without incident; see MAR for injection details.  Patient tolerated procedure well and without incident.  No questions or complaints noted at this time.

## 2014-06-19 ENCOUNTER — Telehealth (HOSPITAL_COMMUNITY): Payer: Self-pay | Admitting: *Deleted

## 2014-06-19 ENCOUNTER — Other Ambulatory Visit (HOSPITAL_COMMUNITY): Payer: Self-pay | Admitting: Oncology

## 2014-06-19 DIAGNOSIS — M899 Disorder of bone, unspecified: Secondary | ICD-10-CM

## 2014-06-19 DIAGNOSIS — C61 Malignant neoplasm of prostate: Secondary | ICD-10-CM

## 2014-06-19 MED ORDER — OXYCODONE HCL 5 MG PO TABS: 10.0000 mg | ORAL_TABLET | ORAL | Status: DC

## 2014-06-19 NOTE — Telephone Encounter (Signed)
Rx is ready for pick up.

## 2014-06-24 ENCOUNTER — Encounter (HOSPITAL_COMMUNITY): Payer: Self-pay | Admitting: Hematology & Oncology

## 2014-06-24 NOTE — Assessment & Plan Note (Signed)
Present 69 year old male with stage IV adenocarcinoma of the prostate, hormone sensitive disease. He is currently on single agent Taxotere. He is also on Xgeva for bony metastatic disease. He is overall doing well he has had little difficulties tolerating treatment. Plan will be for total of 6 cycles of therapy. If he has interim problems prior to his next follow-up he is advised to call.

## 2014-06-24 NOTE — Assessment & Plan Note (Signed)
He has anemia which is worsening and most likely secondary to his underlying chemotherapy. He also has chronic kidney disease, and his stage IV malignancy certainly contribute to his anemia. I recommended ongoing observation. B12 levels are normal. Iron studies are suggestive and consistent with anemia of chronic disease. He can consider adding folic acid at his next follow-up. I discussed with Arlander if his counts become any lower we may need to consider transfusion. We could also consider adding ESA's, but I would prefer to wait until he has completed therapy to see how his counts normalize.

## 2014-06-26 ENCOUNTER — Other Ambulatory Visit (HOSPITAL_COMMUNITY): Payer: PRIVATE HEALTH INSURANCE

## 2014-06-26 ENCOUNTER — Encounter (HOSPITAL_BASED_OUTPATIENT_CLINIC_OR_DEPARTMENT_OTHER): Payer: Medicare Other | Admitting: Hematology & Oncology

## 2014-06-26 ENCOUNTER — Encounter: Payer: Self-pay | Admitting: *Deleted

## 2014-06-26 ENCOUNTER — Encounter (HOSPITAL_COMMUNITY): Payer: Self-pay

## 2014-06-26 ENCOUNTER — Encounter (HOSPITAL_COMMUNITY): Payer: Medicare Other | Attending: Hematology and Oncology

## 2014-06-26 ENCOUNTER — Encounter (HOSPITAL_COMMUNITY): Payer: Medicare Other

## 2014-06-26 DIAGNOSIS — Z87898 Personal history of other specified conditions: Secondary | ICD-10-CM | POA: Insufficient documentation

## 2014-06-26 DIAGNOSIS — C779 Secondary and unspecified malignant neoplasm of lymph node, unspecified: Secondary | ICD-10-CM | POA: Insufficient documentation

## 2014-06-26 DIAGNOSIS — C61 Malignant neoplasm of prostate: Secondary | ICD-10-CM | POA: Insufficient documentation

## 2014-06-26 DIAGNOSIS — J449 Chronic obstructive pulmonary disease, unspecified: Secondary | ICD-10-CM | POA: Diagnosis not present

## 2014-06-26 DIAGNOSIS — Z5111 Encounter for antineoplastic chemotherapy: Secondary | ICD-10-CM | POA: Diagnosis not present

## 2014-06-26 DIAGNOSIS — C7951 Secondary malignant neoplasm of bone: Secondary | ICD-10-CM

## 2014-06-26 DIAGNOSIS — N1330 Unspecified hydronephrosis: Secondary | ICD-10-CM

## 2014-06-26 DIAGNOSIS — N179 Acute kidney failure, unspecified: Secondary | ICD-10-CM

## 2014-06-26 LAB — CBC WITH DIFFERENTIAL/PLATELET
BASOS ABS: 0 10*3/uL (ref 0.0–0.1)
Basophils Relative: 0 % (ref 0–1)
Eosinophils Absolute: 0 10*3/uL (ref 0.0–0.7)
Eosinophils Relative: 0 % (ref 0–5)
HCT: 31.7 % — ABNORMAL LOW (ref 39.0–52.0)
HEMOGLOBIN: 10.3 g/dL — AB (ref 13.0–17.0)
Lymphocytes Relative: 7 % — ABNORMAL LOW (ref 12–46)
Lymphs Abs: 0.9 10*3/uL (ref 0.7–4.0)
MCH: 29.8 pg (ref 26.0–34.0)
MCHC: 32.5 g/dL (ref 30.0–36.0)
MCV: 91.6 fL (ref 78.0–100.0)
Monocytes Absolute: 0.7 10*3/uL (ref 0.1–1.0)
Monocytes Relative: 5 % (ref 3–12)
NEUTROS ABS: 11 10*3/uL — AB (ref 1.7–7.7)
NEUTROS PCT: 88 % — AB (ref 43–77)
PLATELETS: 391 10*3/uL (ref 150–400)
RBC: 3.46 MIL/uL — AB (ref 4.22–5.81)
RDW: 17.2 % — AB (ref 11.5–15.5)
WBC: 12.5 10*3/uL — AB (ref 4.0–10.5)

## 2014-06-26 LAB — COMPREHENSIVE METABOLIC PANEL
ALK PHOS: 75 U/L (ref 39–117)
ALT: 14 U/L (ref 0–53)
AST: 22 U/L (ref 0–37)
Albumin: 3.6 g/dL (ref 3.5–5.2)
Anion gap: 9 (ref 5–15)
BILIRUBIN TOTAL: 0.2 mg/dL — AB (ref 0.3–1.2)
BUN: 29 mg/dL — AB (ref 6–23)
CALCIUM: 9.2 mg/dL (ref 8.4–10.5)
CO2: 23 mmol/L (ref 19–32)
CREATININE: 1.75 mg/dL — AB (ref 0.50–1.35)
Chloride: 103 mmol/L (ref 96–112)
GFR calc Af Amer: 45 mL/min — ABNORMAL LOW (ref >90)
GFR calc non Af Amer: 39 mL/min — ABNORMAL LOW (ref >90)
Glucose, Bld: 120 mg/dL — ABNORMAL HIGH (ref 70–99)
Potassium: 4.5 mmol/L (ref 3.5–5.1)
Sodium: 135 mmol/L (ref 135–145)
Total Protein: 7.7 g/dL (ref 6.0–8.3)

## 2014-06-26 MED ORDER — ONDANSETRON HCL 40 MG/20ML IJ SOLN
Freq: Once | INTRAMUSCULAR | Status: AC
  Administered 2014-06-26: 55 mg via INTRAVENOUS
  Filled 2014-06-26: qty 4

## 2014-06-26 MED ORDER — SODIUM CHLORIDE 0.9 % IV SOLN: 8.0000 mg | Freq: Once | INTRAVENOUS | Status: DC

## 2014-06-26 MED ORDER — DEXAMETHASONE SODIUM PHOSPHATE 10 MG/ML IJ SOLN: 10.0000 mg | Freq: Once | INTRAMUSCULAR | Status: DC

## 2014-06-26 MED ORDER — SODIUM CHLORIDE 0.9 % IV SOLN
Freq: Once | INTRAVENOUS | Status: AC
  Administered 2014-06-26: 09:00:00 via INTRAVENOUS

## 2014-06-26 MED ORDER — DOCETAXEL CHEMO INJECTION 160 MG/16ML
75.0000 mg/m2 | Freq: Once | INTRAVENOUS | Status: AC
  Administered 2014-06-26: 150 mg via INTRAVENOUS
  Filled 2014-06-26: qty 15

## 2014-06-26 MED ORDER — HEPARIN SOD (PORK) LOCK FLUSH 100 UNIT/ML IV SOLN
500.0000 [IU] | Freq: Once | INTRAVENOUS | Status: AC | PRN
  Administered 2014-06-26: 500 [IU]
  Filled 2014-06-26: qty 5

## 2014-06-26 NOTE — Progress Notes (Signed)
Jeff [name] Tolerated chemotherapy well today.  Discharged ambulatory

## 2014-06-26 NOTE — Progress Notes (Signed)
[name], MD [place] [address]  Stage IV Adenocarcinoma of the prostate, Hormone Sensitive Disease Bilateral orchiectomy Taxotere/Xgeva started 02/2014 Bilateral nephrostomy tube placement 01/2014  CURRENT THERAPY:Taxotere/Xgeva  INTERVAL HISTORY: Jeff [name] 69 y.o. male returns for follow-up of his hormone sensitive stage IV prostate cancer. He is on Taxotere. His only major complaint revolves around his nephrostomy tubes which he wants out. He is here for his last cycle of treatment. He is questioning if he needs to keep seeing [place]. He denies fever, chills, nausea or vomiting. He states his appetite is fairly good. Days his last cycle of Taxotere.  MEDICAL HISTORY: Past Medical History  Diagnosis Date  . Acute renal failure 01/26/2014  . Anemia of chronic disease 01/26/2014  . Bilateral hydronephrosis 01/26/2014  . History of cocaine abuse 01/26/2014  . Homelessness 01/31/2014  . 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 Alcohol abuse   . UTI (lower urinary tract infection)   . Sepsis     has Bone lesion; Acute renal failure; Bilateral hydronephrosis; Anemia of chronic disease; History of cocaine abuse; Prostate cancer metastatic to multiple sites; Homelessness; Health care maintenance; Sepsis; UTI (lower urinary tract infection); Malnutrition of moderate degree; Bacteremia; and Pyrexia on his problem list.      Prostate cancer metastatic to multiple sites   01/26/2014 Tumor Marker PSA > 5000   01/28/2014 Initial Biopsy Metastatic adenocarcinoma of prostate   02/05/2014 Surgery Bilateral orchiectomy by Dr. Junious Silk   02/26/2014 Tumor Marker PSA = 399.5   03/14/2014 Tumor Marker PSA= 89.51   03/14/2014 -  Chemotherapy Docetaxel 75 mg/kg every 21 days   04/24/2014 Tumor Marker PSA- 19.89     has No Known Allergies.  [name] does not currently have medications on file.  SURGICAL HISTORY: Past Surgical History    Procedure Laterality Date  . Orchiectomy Bilateral 02/05/2014    Procedure: BILATERAL ORCHIECTOMY;  Surgeon: Festus Aloe, MD;  Location: [facility];  Service: Urology;  Laterality: Bilateral;  . Portacath placement Right 03/12/14  . Percutaneous nephrostomy Bilateral     IR Dr. Junious Silk changed on 04/22/2014    SOCIAL HISTORY: History   Social History  . Marital Status: Single    Spouse Name: N/A  . Number of Children: 1  . Years of Education: 9th   Occupational History  . disabilty    Social History Main Topics  . Smoking status: Current Every Day Smoker -- 0.25 packs/day for 55 years    Types: Cigarettes  . Smokeless tobacco: Never Used  . Alcohol Use: No     Comment: none for at least 7 -8 years  . Drug Use: Yes    Special: Cocaine     Comment: last used Cocaine 01/25/14  . Sexual Activity: Yes   Other Topics Concern  . Not on file   Social History Narrative   Lives at [employer] 9th grade   1 son, lives in [place], [place]   Can read and write in native language             FAMILY HISTORY: Family History  Problem Relation Age of Onset  . Cancer Brother 60    Review of Systems  Constitutional: Negative for fever, chills, weight loss and malaise/fatigue.  HENT: Negative for congestion, hearing loss, nosebleeds, sore throat and tinnitus.   Eyes: Negative for blurred vision, double vision, pain and discharge.  Respiratory: Negative for cough, hemoptysis,  sputum production, shortness of breath and wheezing.   Cardiovascular: Negative for chest pain, palpitations, claudication, leg swelling and PND.  Gastrointestinal: Negative for heartburn, nausea, vomiting, abdominal pain, diarrhea, constipation, blood in stool and melena.  Genitourinary: Negative for dysuria, urgency, frequency and hematuria.  Musculoskeletal: Negative for myalgias, joint pain and falls.  Skin: Negative for itching and rash.  Neurological: Negative for dizziness,  tingling, tremors, sensory change, speech change, focal weakness, seizures, loss of consciousness, weakness and headaches.  Endo/Heme/Allergies: Does not bruise/bleed easily.  Psychiatric/Behavioral: Negative for depression, suicidal ideas, memory loss and substance abuse. The patient is not nervous/anxious and does not have insomnia.     PHYSICAL EXAMINATION  ECOG PERFORMANCE STATUS: 0 - Asymptomatic  There were no vitals filed for this visit.  Physical Exam  Constitutional: He is oriented to person, place, and time and well-developed, well-nourished, and in no distress.  HENT:  Head: Normocephalic and atraumatic.  Nose: Nose normal.  Mouth/Throat: Oropharynx is clear and moist. No oropharyngeal exudate.  Eyes: Conjunctivae and EOM are normal. Pupils are equal, round, and reactive to light. Right eye exhibits no discharge. Left eye exhibits no discharge. No scleral icterus.  Neck: Normal range of motion. Neck supple. No tracheal deviation present. No thyromegaly present.  Cardiovascular: Normal rate, regular rhythm and normal heart sounds.  Exam reveals no gallop and no friction rub.   No murmur heard. Pulmonary/Chest: Effort normal and breath sounds normal. He has no wheezes. He has no rales.  Abdominal: Soft. Bowel sounds are normal. He exhibits no distension and no mass. There is no tenderness. There is no rebound and no guarding.  Genitourinary:  Bilateral Nephrostomy tubes, bandaged, C/D/I  Musculoskeletal: Normal range of motion. He exhibits no edema.  Lymphadenopathy:    He has no cervical adenopathy.  Neurological: He is alert and oriented to person, place, and time. He has normal reflexes. No cranial nerve deficit. Gait normal. Coordination normal.  Skin: Skin is warm and dry. No rash noted.  Psychiatric: Mood, memory, affect and judgment normal.  Nursing note and vitals reviewed.   LABORATORY DATA:  CBC    Component Value Date/Time   WBC 12.5* 06/26/2014 0833   RBC  3.46* 06/26/2014 0833   HGB 10.3* 06/26/2014 0833   HCT 31.7* 06/26/2014 0833   PLT 391 06/26/2014 0833   MCV 91.6 06/26/2014 0833   MCH 29.8 06/26/2014 0833   MCHC 32.5 06/26/2014 0833   RDW 17.2* 06/26/2014 0833   LYMPHSABS 0.9 06/26/2014 0833   MONOABS 0.7 06/26/2014 0833   EOSABS 0.0 06/26/2014 0833   BASOSABS 0.0 06/26/2014 0833   CMP     Component Value Date/Time   NA 135 06/26/2014 0833   K 4.5 06/26/2014 0833   CL 103 06/26/2014 0833   CO2 23 06/26/2014 0833   GLUCOSE 120* 06/26/2014 0833   BUN 29* 06/26/2014 0833   CREATININE 1.75* 06/26/2014 0833   CREATININE 1.39* 02/06/2014 1012   CALCIUM 9.2 06/26/2014 0833   PROT 7.7 06/26/2014 0833   ALBUMIN 3.6 06/26/2014 0833   AST 22 06/26/2014 0833   ALT 14 06/26/2014 0833   ALKPHOS 75 06/26/2014 0833   BILITOT 0.2* 06/26/2014 0833   GFRNONAA 39* 06/26/2014 0833   GFRAA 45* 06/26/2014 0833     ASSESSMENT and THERAPY PLAN:   Stage IV prostate cancer, hormone sensitive disease. Bilateral nephrostomy tubes Bilateral orchiectomy History of polysubstance abuse  Anemia, multifactorial    69 year old male with stage IV adenocarcinoma of the prostate,  hormone sensitive disease. He is on Xgeva and Taxotere under our care. He has done remarkably well with treatment. [facility] [place] [facility] therapy as managed by his urologist. I have advised the patient that I suspect significant improvement in his disease and at some point I anticipate his nephrostomy tubes will be removed.  I discussed with him ongoing surveillance after completion of his chemotherapy. He is willing to come back to the clinic. Although he felt after treatment he would "be done." He was advised if he has any difficulties after his current treatment today to notify us. Otherwise we will see him back in 4 weeks. His anemia will need ongoing observation and potentially additional evaluation.  All questions were answered. The patient knows to call the clinic with any problems,  questions or concerns. We can certainly see the patient much sooner if necessary. Molli Hazard 06/26/2014

## 2014-06-26 NOTE — Patient Instructions (Signed)
[facility] Discharge Instructions for Patients Receiving Chemotherapy  Today you received the following chemotherapy agents taxotere Please call the clinic if you have any questions or concerns.  tReturn Friday for your neulasta injection.  To help prevent nausea and vomiting after your treatment, we encourage you to take your nausea medication    If you develop nausea and vomiting that is not controlled by your nausea medication, call the clinic. If it is after clinic hours your family physician or the after hours number for the clinic or go to the Emergency Department.   BELOW ARE SYMPTOMS THAT SHOULD BE REPORTED IMMEDIATELY:  *FEVER GREATER THAN 101.0 F  *CHILLS WITH OR WITHOUT FEVER  NAUSEA AND VOMITING THAT IS NOT CONTROLLED WITH YOUR NAUSEA MEDICATION  *UNUSUAL SHORTNESS OF BREATH  *UNUSUAL BRUISING OR BLEEDING  TENDERNESS IN MOUTH AND THROAT WITH OR WITHOUT PRESENCE OF ULCERS  *URINARY PROBLEMS  *BOWEL PROBLEMS  UNUSUAL RASH Items with * indicate a potential emergency and should be followed up as soon as possible.  One of the nurses will contact you 24 hours after your treatment. Please let the nurse know about any problems that you may have experienced. Feel free to call the clinic you have any questions or concerns. The clinic phone number is [phone].   I have been informed and understand all the instructions given to me. I know to contact the clinic, my physician, or go to the Emergency Department if any problems should occur. I do not have any questions at this time, but understand that I may call the clinic during office hours or the Patient Navigator at [phone] should I have any questions or need assistance in obtaining follow up care.

## 2014-06-26 NOTE — Progress Notes (Signed)
[place] Clinical Social Work  Clinical Social Work was referred by [place] rounding for assessment of psychosocial needs due to wanting to check in on patient.  Clinical Social Worker met with pt during his visit to offer support and assess for needs.   Pt reports things are going well at [facility] where he has been a resident since last fall. Pt reports he is using [facility] for transportation and denied any current needs. CSW provided him with information sheet, reviewed role of CSW and contact number for CSW. Pt agrees to reach out as needed, but overall denied any current concerns at his ALF or otherwise.   Clinical Social Work interventions: Supportive listening Resource education    Loren Racer, [place] Tuesdays 8:30-1pm Wednesdays 8:30-12pm  [phone]

## 2014-06-27 LAB — PSA: PSA: 9.11 ng/mL — ABNORMAL HIGH (ref <=4.00)

## 2014-06-28 ENCOUNTER — Encounter (HOSPITAL_COMMUNITY): Payer: Self-pay

## 2014-06-28 ENCOUNTER — Encounter (HOSPITAL_BASED_OUTPATIENT_CLINIC_OR_DEPARTMENT_OTHER): Payer: Medicare Other

## 2014-06-28 DIAGNOSIS — Z5189 Encounter for other specified aftercare: Secondary | ICD-10-CM | POA: Diagnosis not present

## 2014-06-28 DIAGNOSIS — C7951 Secondary malignant neoplasm of bone: Secondary | ICD-10-CM

## 2014-06-28 DIAGNOSIS — C61 Malignant neoplasm of prostate: Secondary | ICD-10-CM

## 2014-06-28 MED ORDER — PEGFILGRASTIM INJECTION 6 MG/0.6ML ~~LOC~~
PREFILLED_SYRINGE | SUBCUTANEOUS | Status: AC
  Filled 2014-06-28: qty 0.6

## 2014-06-28 MED ORDER — PEGFILGRASTIM INJECTION 6 MG/0.6ML ~~LOC~~
6.0000 mg | PREFILLED_SYRINGE | Freq: Once | SUBCUTANEOUS | Status: AC
  Administered 2014-06-28: 6 mg via SUBCUTANEOUS

## 2014-06-28 NOTE — Patient Instructions (Signed)
[place] at [facility] Discharge Instructions  RECOMMENDATIONS MADE BY THE CONSULTANT AND ANY TEST RESULTS WILL BE SENT TO YOUR REFERRING PHYSICIAN.  Today you received Neulasta. Return as scheduled for office visit. Call the clinic with any questions or concerns.   Thank you for choosing [place] at [facility] to provide your oncology and hematology care.  To afford each patient quality time with our provider, please arrive at least 15 minutes before your scheduled appointment time.    You need to re-schedule your appointment should you arrive 10 or more minutes late.  We strive to give you quality time with our providers, and arriving late affects you and other patients whose appointments are after yours.  Also, if you no show three or more times for appointments you may be dismissed from the clinic at the providers discretion.     Again, thank you for choosing [facility].  Our hope is that these requests will decrease the amount of time that you wait before being seen by our physicians.       _____________________________________________________________  Should you have questions after your visit to [facility], please contact our office at [phone] between the hours of 8:30 a.m. and 4:30 p.m.  Voicemails left after 4:30 p.m. will not be returned until the following business day.  For prescription refill requests, have your pharmacy contact our office.

## 2014-07-04 ENCOUNTER — Other Ambulatory Visit (HOSPITAL_COMMUNITY): Payer: Self-pay | Admitting: Oncology

## 2014-07-04 DIAGNOSIS — C61 Malignant neoplasm of prostate: Secondary | ICD-10-CM

## 2014-07-04 DIAGNOSIS — M899 Disorder of bone, unspecified: Secondary | ICD-10-CM

## 2014-07-04 MED ORDER — OXYCODONE HCL 5 MG PO TABS: 10.0000 mg | ORAL_TABLET | ORAL | Status: DC

## 2014-07-15 ENCOUNTER — Other Ambulatory Visit: Payer: Self-pay | Admitting: Urology

## 2014-07-15 ENCOUNTER — Ambulatory Visit (HOSPITAL_COMMUNITY)
Admission: RE | Admit: 2014-07-15 | Discharge: 2014-07-15 | Disposition: A | Discharge status: Home / Self Care | Payer: Medicare Other | Source: Ambulatory Visit | Attending: Urology | Admitting: Urology

## 2014-07-15 DIAGNOSIS — C61 Malignant neoplasm of prostate: Secondary | ICD-10-CM | POA: Diagnosis not present

## 2014-07-15 DIAGNOSIS — Z436 Encounter for attention to other artificial openings of urinary tract: Secondary | ICD-10-CM | POA: Diagnosis not present

## 2014-07-15 DIAGNOSIS — N138 Other obstructive and reflux uropathy: Secondary | ICD-10-CM | POA: Diagnosis not present

## 2014-07-15 MED ORDER — IOHEXOL 300 MG/ML  SOLN
20.0000 mL | Freq: Once | INTRAMUSCULAR | Status: AC | PRN
  Administered 2014-07-15: 20 mL

## 2014-07-16 ENCOUNTER — Encounter (HOSPITAL_COMMUNITY): Payer: Self-pay | Admitting: Hematology & Oncology

## 2014-07-18 ENCOUNTER — Other Ambulatory Visit (HOSPITAL_COMMUNITY): Payer: Self-pay | Admitting: Oncology

## 2014-07-18 DIAGNOSIS — M899 Disorder of bone, unspecified: Secondary | ICD-10-CM

## 2014-07-18 DIAGNOSIS — C61 Malignant neoplasm of prostate: Secondary | ICD-10-CM

## 2014-07-18 MED ORDER — OXYCODONE HCL 5 MG PO TABS: 10.0000 mg | ORAL_TABLET | ORAL | Status: DC

## 2014-07-23 ENCOUNTER — Other Ambulatory Visit: Payer: Self-pay | Admitting: Urology

## 2014-07-23 ENCOUNTER — Ambulatory Visit (HOSPITAL_COMMUNITY)
Admission: RE | Admit: 2014-07-23 | Discharge: 2014-07-23 | Disposition: A | Discharge status: Home / Self Care | Payer: Medicare Other | Source: Ambulatory Visit | Attending: Interventional Radiology | Admitting: Interventional Radiology

## 2014-07-23 DIAGNOSIS — Z8546 Personal history of malignant neoplasm of prostate: Secondary | ICD-10-CM | POA: Diagnosis not present

## 2014-07-23 DIAGNOSIS — Z9221 Personal history of antineoplastic chemotherapy: Secondary | ICD-10-CM | POA: Diagnosis not present

## 2014-07-23 DIAGNOSIS — C61 Malignant neoplasm of prostate: Secondary | ICD-10-CM

## 2014-07-23 DIAGNOSIS — Z436 Encounter for attention to other artificial openings of urinary tract: Secondary | ICD-10-CM | POA: Insufficient documentation

## 2014-07-23 DIAGNOSIS — R590 Localized enlarged lymph nodes: Secondary | ICD-10-CM | POA: Diagnosis not present

## 2014-07-23 DIAGNOSIS — N135 Crossing vessel and stricture of ureter without hydronephrosis: Secondary | ICD-10-CM | POA: Insufficient documentation

## 2014-07-23 MED ORDER — IOHEXOL 300 MG/ML  SOLN
10.0000 mL | Freq: Once | INTRAMUSCULAR | Status: AC | PRN
  Administered 2014-07-23: 20 mL

## 2014-07-24 ENCOUNTER — Encounter (HOSPITAL_BASED_OUTPATIENT_CLINIC_OR_DEPARTMENT_OTHER): Payer: Medicare Other

## 2014-07-24 ENCOUNTER — Ambulatory Visit (HOSPITAL_COMMUNITY): Payer: Medicare Other | Admitting: Hematology & Oncology

## 2014-07-24 ENCOUNTER — Other Ambulatory Visit (HOSPITAL_COMMUNITY): Payer: PRIVATE HEALTH INSURANCE

## 2014-07-24 ENCOUNTER — Encounter (HOSPITAL_COMMUNITY): Payer: Medicare Other | Admitting: Hematology & Oncology

## 2014-07-24 DIAGNOSIS — C61 Malignant neoplasm of prostate: Secondary | ICD-10-CM | POA: Diagnosis not present

## 2014-07-24 DIAGNOSIS — J449 Chronic obstructive pulmonary disease, unspecified: Secondary | ICD-10-CM | POA: Diagnosis not present

## 2014-07-24 DIAGNOSIS — C7951 Secondary malignant neoplasm of bone: Secondary | ICD-10-CM | POA: Diagnosis not present

## 2014-07-24 DIAGNOSIS — M899 Disorder of bone, unspecified: Secondary | ICD-10-CM

## 2014-07-24 DIAGNOSIS — C779 Secondary and unspecified malignant neoplasm of lymph node, unspecified: Secondary | ICD-10-CM | POA: Diagnosis not present

## 2014-07-24 DIAGNOSIS — Z87898 Personal history of other specified conditions: Secondary | ICD-10-CM | POA: Diagnosis not present

## 2014-07-24 LAB — COMPREHENSIVE METABOLIC PANEL
ALT: 21 U/L (ref 0–53)
ANION GAP: 6 (ref 5–15)
AST: 26 U/L (ref 0–37)
Albumin: 3.7 g/dL (ref 3.5–5.2)
Alkaline Phosphatase: 66 U/L (ref 39–117)
BILIRUBIN TOTAL: 0.3 mg/dL (ref 0.3–1.2)
BUN: 24 mg/dL — AB (ref 6–23)
CALCIUM: 9.1 mg/dL (ref 8.4–10.5)
CO2: 26 mmol/L (ref 19–32)
Chloride: 104 mmol/L (ref 96–112)
Creatinine, Ser: 1.75 mg/dL — ABNORMAL HIGH (ref 0.50–1.35)
GFR calc Af Amer: 45 mL/min — ABNORMAL LOW (ref >90)
GFR calc non Af Amer: 39 mL/min — ABNORMAL LOW (ref >90)
GLUCOSE: 103 mg/dL — AB (ref 70–99)
Potassium: 4.7 mmol/L (ref 3.5–5.1)
Sodium: 136 mmol/L (ref 135–145)
TOTAL PROTEIN: 7.7 g/dL (ref 6.0–8.3)

## 2014-07-24 LAB — CBC WITH DIFFERENTIAL/PLATELET
BASOS ABS: 0 10*3/uL (ref 0.0–0.1)
Basophils Relative: 1 % (ref 0–1)
EOS ABS: 0.6 10*3/uL (ref 0.0–0.7)
Eosinophils Relative: 9 % — ABNORMAL HIGH (ref 0–5)
HCT: 32 % — ABNORMAL LOW (ref 39.0–52.0)
Hemoglobin: 10 g/dL — ABNORMAL LOW (ref 13.0–17.0)
Lymphocytes Relative: 26 % (ref 12–46)
Lymphs Abs: 1.7 10*3/uL (ref 0.7–4.0)
MCH: 29.1 pg (ref 26.0–34.0)
MCHC: 31.3 g/dL (ref 30.0–36.0)
MCV: 93 fL (ref 78.0–100.0)
MONO ABS: 0.8 10*3/uL (ref 0.1–1.0)
Monocytes Relative: 13 % — ABNORMAL HIGH (ref 3–12)
NEUTROS ABS: 3.3 10*3/uL (ref 1.7–7.7)
NEUTROS PCT: 51 % (ref 43–77)
Platelets: 362 10*3/uL (ref 150–400)
RBC: 3.44 MIL/uL — ABNORMAL LOW (ref 4.22–5.81)
RDW: 17.1 % — ABNORMAL HIGH (ref 11.5–15.5)
WBC: 6.4 10*3/uL (ref 4.0–10.5)

## 2014-07-24 MED ORDER — DENOSUMAB 120 MG/1.7ML ~~LOC~~ SOLN
120.0000 mg | Freq: Once | SUBCUTANEOUS | Status: AC
  Administered 2014-07-24: 120 mg via SUBCUTANEOUS
  Filled 2014-07-24: qty 1.7

## 2014-07-24 NOTE — Patient Instructions (Signed)
[place] at [facility] Discharge Instructions  RECOMMENDATIONS MADE BY THE CONSULTANT AND ANY TEST RESULTS WILL BE SENT TO YOUR REFERRING PHYSICIAN.  Today you received XGEVA. This medication helps protect your bones from fractures.   Continue to take your [facility] everyday!!!!!!!  Appointment with Dr. Whitney Muse on Monday April 11 @ 1:20. We will flush your port the same day. Remember to ask Dr. Whitney Muse about getting your port out since you are wanting it out.   Thank you for choosing [place] at [facility] to provide your oncology and hematology care.  To afford each patient quality time with our provider, please arrive at least 15 minutes before your scheduled appointment time.    You need to re-schedule your appointment should you arrive 10 or more minutes late.  We strive to give you quality time with our providers, and arriving late affects you and other patients whose appointments are after yours.  Also, if you no show three or more times for appointments you may be dismissed from the clinic at the providers discretion.     Again, thank you for choosing [facility].  Our hope is that these requests will decrease the amount of time that you wait before being seen by our physicians.       _____________________________________________________________  Should you have questions after your visit to [facility], please contact our office at [phone] between the hours of 8:30 a.m. and 4:30 p.m.  Voicemails left after 4:30 p.m. will not be returned until the following business day.  For prescription refill requests, have your pharmacy contact our office.

## 2014-07-24 NOTE — Progress Notes (Deleted)
Jeff Ends, MD [name] [id number]  Stage IV Adenocarcinoma of the prostate, Hormone Sensitive Disease Bilateral orchiectomy Taxotere/Xgeva started 02/2014 Bilateral nephrostomy tube placement 01/2014  CURRENT THERAPY:Taxotere/Xgeva  INTERVAL HISTORY: Jeff [name] 69 y.o. male returns for follow-up of his hormone sensitive stage IV prostate cancer. He is on Taxotere. His only major complaint revolves around his nephrostomy tubes which he wants out. He is here for his last cycle of treatment. He is questioning if he needs to keep seeing [place]. He denies fever, chills, nausea or vomiting. He states his appetite is fairly good. Days his last cycle of Taxotere.  MEDICAL HISTORY: Past Medical History  Diagnosis Date  . Acute renal failure 01/26/2014  . Anemia of chronic disease 01/26/2014  . Bilateral hydronephrosis 01/26/2014  . History of cocaine abuse 01/26/2014  . Homelessness 01/31/2014  . 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 Alcohol abuse   . UTI (lower urinary tract infection)   . Sepsis     has Bone lesion; Acute renal failure; Bilateral hydronephrosis; Anemia of chronic disease; History of cocaine abuse; Prostate cancer metastatic to multiple sites; Homelessness; Health care maintenance; Sepsis; UTI (lower urinary tract infection); Malnutrition of moderate degree; Bacteremia; and Pyrexia on his problem list.      Prostate cancer metastatic to multiple sites   01/26/2014 Tumor Marker PSA > 5000   01/28/2014 Initial Biopsy Metastatic adenocarcinoma of prostate   02/05/2014 Surgery Bilateral orchiectomy by Dr. Junious [name]   02/26/2014 Tumor Marker PSA = 399.5   03/14/2014 Tumor Marker PSA= 89.51   03/14/2014 -  Chemotherapy Docetaxel 75 mg/kg every 21 days   04/24/2014 Tumor Marker PSA- 19.89     has No Known Allergies.  Jeff [name] does not currently have medications on file.  SURGICAL HISTORY: Past Surgical History    Procedure Laterality Date  . Orchiectomy Bilateral 02/05/2014    Procedure: BILATERAL ORCHIECTOMY;  Surgeon: Jeff Aloe, MD;  Location: [facility];  Service: Urology;  Laterality: Bilateral;  . Portacath placement Right 03/12/14  . Percutaneous nephrostomy Bilateral     IR Dr. Junious [name] changed on 04/22/2014    SOCIAL HISTORY: History   Social History  . Marital Status: Single    Spouse Name: N/A  . Number of Children: 1  . Years of Education: 9th   Occupational History  . disabilty    Social History Main Topics  . Smoking status: Current Every Day Smoker -- 0.25 packs/day for 55 years    Types: Cigarettes  . Smokeless tobacco: Never Used  . Alcohol Use: No     Comment: none for at least 7 -8 years  . Drug Use: Yes    Special: Cocaine     Comment: last used Cocaine 01/25/14  . Sexual Activity: Yes   Other Topics Concern  . Not on file   Social History Narrative   Lives at [employer] 9th grade   1 son, lives in [place], [name]   Can read and write in native language             FAMILY HISTORY: Family History  Problem Relation Age of Onset  . Cancer Brother 60    ROS  PHYSICAL EXAMINATION  ECOG PERFORMANCE STATUS: 0 - Asymptomatic  There were no vitals filed for this visit.  Physical Exam  Constitutional: He is oriented to person, place, and time and well-developed, well-nourished, and in no distress.  HENT:  Head: Normocephalic and atraumatic.  Nose: Nose normal.  Mouth/Throat: Oropharynx is clear and moist. No oropharyngeal exudate.  Eyes: Conjunctivae and EOM are normal. Pupils are equal, round, and reactive to light. Right eye exhibits no discharge. Left eye exhibits no discharge. No scleral icterus.  Neck: Normal range of motion. Neck supple. No tracheal deviation present. No thyromegaly present.  Cardiovascular: Normal rate, regular rhythm and normal heart sounds.  Exam reveals no gallop and no friction rub.   No murmur  heard. Pulmonary/Chest: Effort normal and breath sounds normal. He has no wheezes. He has no rales.  Abdominal: Soft. Bowel sounds are normal. He exhibits no distension and no mass. There is no tenderness. There is no rebound and no guarding.  Genitourinary:  Bilateral Nephrostomy tubes, bandaged, C/D/I  Musculoskeletal: Normal range of motion. He exhibits no edema.  Lymphadenopathy:    He has no cervical adenopathy.  Neurological: He is alert and oriented to person, place, and time. He has normal reflexes. No cranial nerve deficit. Gait normal. Coordination normal.  Skin: Skin is warm and dry. No rash noted.  Psychiatric: Mood, memory, affect and judgment normal.  Nursing note and vitals reviewed.   LABORATORY DATA:  CBC    Component Value Date/Time   WBC 12.5* 06/26/2014 0833   RBC 3.46* 06/26/2014 0833   HGB 10.3* 06/26/2014 0833   HCT 31.7* 06/26/2014 0833   PLT 391 06/26/2014 0833   MCV 91.6 06/26/2014 0833   MCH 29.8 06/26/2014 0833   MCHC 32.5 06/26/2014 0833   RDW 17.2* 06/26/2014 0833   LYMPHSABS 0.9 06/26/2014 0833   MONOABS 0.7 06/26/2014 0833   EOSABS 0.0 06/26/2014 0833   BASOSABS 0.0 06/26/2014 0833   CMP     Component Value Date/Time   NA 135 06/26/2014 0833   K 4.5 06/26/2014 0833   CL 103 06/26/2014 0833   CO2 23 06/26/2014 0833   GLUCOSE 120* 06/26/2014 0833   BUN 29* 06/26/2014 0833   CREATININE 1.75* 06/26/2014 0833   CREATININE 1.39* 02/06/2014 1012   CALCIUM 9.2 06/26/2014 0833   PROT 7.7 06/26/2014 0833   ALBUMIN 3.6 06/26/2014 0833   AST 22 06/26/2014 0833   ALT 14 06/26/2014 0833   ALKPHOS 75 06/26/2014 0833   BILITOT 0.2* 06/26/2014 0833   GFRNONAA 39* 06/26/2014 0833   GFRAA 45* 06/26/2014 0833     ASSESSMENT and THERAPY PLAN:   Stage IV prostate cancer, hormone sensitive disease. Bilateral nephrostomy tubes Bilateral orchiectomy History of polysubstance abuse  Anemia, multifactorial    69 year old male with stage IV  adenocarcinoma of the prostate, hormone sensitive disease. He is on Xgeva and Taxotere under our care. He has done remarkably well with treatment. [facility] therapy as managed by his urologist. I have advised the patient that I suspect significant improvement in his disease and at some point I anticipate his nephrostomy tubes will be removed.  I discussed with him ongoing surveillance after completion of his chemotherapy. He is willing to come back to the clinic. Although he felt after treatment he would "be done." He was advised if he has any difficulties after his current treatment today to notify us. Otherwise we will see him back in 4 weeks. His anemia will need ongoing observation and potentially additional evaluation.  All questions were answered. The patient knows to call the clinic with any problems, questions or concerns. We can certainly see the patient much sooner if necessary. Molli Hazard 07/24/2014

## 2014-07-24 NOTE — Progress Notes (Signed)
LABS DRAWN

## 2014-07-24 NOTE — Progress Notes (Signed)
Jeff [name] presents today for injection per MD orders. XGEVA 120mg  administered SQ in left Abdomen. Administration without incident. Patient tolerated well.  Pt states that he is taking his [place].

## 2014-07-25 LAB — PSA: PSA: 6.15 ng/mL — ABNORMAL HIGH (ref <=4.00)

## 2014-08-05 ENCOUNTER — Encounter (HOSPITAL_COMMUNITY): Payer: Medicare Other | Attending: Hematology and Oncology | Admitting: Hematology & Oncology

## 2014-08-05 ENCOUNTER — Encounter (HOSPITAL_COMMUNITY): Payer: Self-pay | Admitting: Hematology & Oncology

## 2014-08-05 ENCOUNTER — Encounter (HOSPITAL_COMMUNITY): Payer: Medicare Other

## 2014-08-05 VITALS — BP 125/85 | HR 84 | Temp 98.0°F | Resp 18 | Wt 187.7 lb

## 2014-08-05 DIAGNOSIS — D649 Anemia, unspecified: Secondary | ICD-10-CM | POA: Diagnosis not present

## 2014-08-05 DIAGNOSIS — M899 Disorder of bone, unspecified: Secondary | ICD-10-CM

## 2014-08-05 DIAGNOSIS — C7951 Secondary malignant neoplasm of bone: Secondary | ICD-10-CM | POA: Insufficient documentation

## 2014-08-05 DIAGNOSIS — Z87898 Personal history of other specified conditions: Secondary | ICD-10-CM | POA: Insufficient documentation

## 2014-08-05 DIAGNOSIS — C779 Secondary and unspecified malignant neoplasm of lymph node, unspecified: Secondary | ICD-10-CM | POA: Insufficient documentation

## 2014-08-05 DIAGNOSIS — C61 Malignant neoplasm of prostate: Secondary | ICD-10-CM | POA: Diagnosis not present

## 2014-08-05 DIAGNOSIS — J449 Chronic obstructive pulmonary disease, unspecified: Secondary | ICD-10-CM | POA: Insufficient documentation

## 2014-08-05 MED ORDER — HEPARIN SOD (PORK) LOCK FLUSH 100 UNIT/ML IV SOLN
INTRAVENOUS | Status: AC
  Filled 2014-08-05: qty 5

## 2014-08-05 MED ORDER — OXYCODONE HCL 5 MG PO TABS: 10.0000 mg | ORAL_TABLET | ORAL | Status: DC

## 2014-08-05 MED ORDER — OXYCODONE HCL 5 MG PO TABS: 5.0000 mg | ORAL_TABLET | ORAL | Status: DC

## 2014-08-05 MED ORDER — SODIUM CHLORIDE 0.9 % IJ SOLN
10.0000 mL | INTRAMUSCULAR | Status: DC | PRN
  Administered 2014-08-05: 10 mL via INTRAVENOUS
  Filled 2014-08-05: qty 10

## 2014-08-05 MED ORDER — HEPARIN SOD (PORK) LOCK FLUSH 100 UNIT/ML IV SOLN
500.0000 [IU] | Freq: Once | INTRAVENOUS | Status: AC
  Administered 2014-08-05: 500 [IU] via INTRAVENOUS

## 2014-08-05 NOTE — Progress Notes (Signed)
[name], MD [place] [address]  Stage IV Adenocarcinoma of the prostate, Hormone Sensitive Disease Bilateral orchiectomy 01/2014 Taxotere/Xgeva started 02/2014 Bilateral nephrostomy tube placement 01/2014 Nephrostomy tube removal 07/23/2014  CURRENT THERAPY:Taxotere/Xgeva  INTERVAL HISTORY: Jeff [name] 69 y.o. male returns for follow-up of his hormone sensitive stage IV prostate cancer. He has completed Taxotere. He is doing remarkably well. 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 His nephrostomy tubes are gone and he is very pleased with this.  He has no difficulties urinating. He denies hematuria.  He needs one to two oxycodone daily for bone pain. He reports hot flashes but states they are tolerable. PSA on 3/30 was 6.15.    MEDICAL HISTORY: Past Medical History  Diagnosis Date  . Acute renal failure 01/26/2014  . Anemia of chronic disease 01/26/2014  . Bilateral hydronephrosis 01/26/2014  . History of cocaine abuse 01/26/2014  . Homelessness 01/31/2014  . 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 Alcohol abuse   . UTI (lower urinary tract infection)   . Sepsis     has Bone lesion; Acute renal failure; Bilateral hydronephrosis; Anemia of chronic disease; History of cocaine abuse; Prostate cancer metastatic to multiple sites; Homelessness; Health care maintenance; Sepsis; UTI (lower urinary tract infection); Malnutrition of moderate degree; Bacteremia; and Pyrexia on his problem list.      Prostate cancer metastatic to multiple sites   01/26/2014 Tumor Marker PSA > 5000   01/28/2014 Initial Biopsy Metastatic adenocarcinoma of prostate   02/05/2014 Surgery Bilateral orchiectomy by Dr. Junious Silk   02/26/2014 Tumor Marker PSA = 399.5   03/14/2014 Tumor Marker PSA= 89.51   03/14/2014 -  Chemotherapy Docetaxel 75 mg/kg every 21 days   04/24/2014  Tumor Marker PSA- 19.89     has No Known Allergies.  [name] does not currently have medications on file.  SURGICAL HISTORY: Past Surgical History  Procedure Laterality Date  . Orchiectomy Bilateral 02/05/2014    Procedure: BILATERAL ORCHIECTOMY;  Surgeon: Festus Aloe, MD;  Location: [facility];  Service: Urology;  Laterality: Bilateral;  . Portacath placement Right 03/12/14  . Percutaneous nephrostomy Bilateral     IR Dr. Junious Silk changed on 04/22/2014    SOCIAL HISTORY: History   Social History  . Marital Status: Single    Spouse Name: N/A  . Number of Children: 1  . Years of Education: 9th   Occupational History  . disabilty    Social History Main Topics  . Smoking status: Current Every Day Smoker -- 0.25 packs/day for 55 years    Types: Cigarettes  . Smokeless tobacco: Never Used  . Alcohol Use: No     Comment: none for at least 7 -8 years  . Drug Use: Yes    Special: Cocaine     Comment: last used Cocaine 01/25/14  . Sexual Activity: Yes   Other Topics Concern  . Not on file   Social History Narrative   Lives at [employer] 9th grade   1 son, lives in [place], [place]   Can read and write in native language             FAMILY HISTORY: Family History  Problem Relation Age of Onset  . Cancer Brother 60    Review of Systems  Constitutional: Negative for fever, chills, weight loss and malaise/fatigue.  HENT: Negative for congestion, hearing loss, nosebleeds, sore throat and tinnitus.   Eyes: Negative for blurred vision, double vision, pain and discharge.  Respiratory: Negative for cough, hemoptysis, sputum production, shortness of breath and wheezing.   Cardiovascular: Negative for chest pain, palpitations, claudication, leg swelling and PND.  Gastrointestinal: Negative for heartburn, nausea, vomiting, abdominal pain, diarrhea, constipation, blood in stool and melena.  Genitourinary: Negative for dysuria, urgency, frequency and  hematuria.  Musculoskeletal: Positive for back pain. Negative for myalgias, joint pain and falls.  Skin: Negative for itching and rash.  Neurological: Negative for dizziness, tingling, tremors, sensory change, speech change, focal weakness, seizures, loss of consciousness, weakness and headaches.  Endo/Heme/Allergies: Does not bruise/bleed easily.  Psychiatric/Behavioral: Negative for depression, suicidal ideas, memory loss and substance abuse. The patient is not nervous/anxious and does not have insomnia.     PHYSICAL EXAMINATION  ECOG PERFORMANCE STATUS: 0 - Asymptomatic  Filed Vitals:   08/05/14 1325  BP: 125/85  Pulse: 84  Temp: 98 F (36.7 C)  Resp: 18    Physical Exam  Constitutional: He is oriented to person, place, and time and well-developed, well-nourished, and in no distress.  HENT:  Head: Normocephalic and atraumatic.  Nose: Nose normal.  Mouth/Throat: Oropharynx is clear and moist. No oropharyngeal exudate.  Poor dentition  Eyes: Conjunctivae and EOM are normal. Pupils are equal, round, and reactive to light. Right eye exhibits no discharge. Left eye exhibits no discharge. No scleral icterus.  Neck: Normal range of motion. Neck supple. No tracheal deviation present. No thyromegaly present.  Cardiovascular: Normal rate, regular rhythm and normal heart sounds.  Exam reveals no gallop and no friction rub.   No murmur heard. Pulmonary/Chest: Effort normal and breath sounds normal. He has no wheezes. He has no rales.  Abdominal: Soft. Bowel sounds are normal. He exhibits no distension and no mass. There is no tenderness. There is no rebound and no guarding.  Genitourinary:  Nephrostomy tube sites are well healed  Musculoskeletal: Normal range of motion. He exhibits no edema.  Lymphadenopathy:    He has no cervical adenopathy.  Neurological: He is alert and oriented to person, place, and time. He has normal reflexes. No cranial nerve deficit. Gait normal. Coordination  normal.  Skin: Skin is warm and dry. No rash noted.  Psychiatric: Mood, memory, affect and judgment normal.  Nursing note and vitals reviewed.   LABORATORY DATA:  CBC    Component Value Date/Time   WBC 6.4 07/24/2014 1205   RBC 3.44* 07/24/2014 1205   HGB 10.0* 07/24/2014 1205   HCT 32.0* 07/24/2014 1205   PLT 362 07/24/2014 1205   MCV 93.0 07/24/2014 1205   MCH 29.1 07/24/2014 1205   MCHC 31.3 07/24/2014 1205   RDW 17.1* 07/24/2014 1205   LYMPHSABS 1.7 07/24/2014 1205   MONOABS 0.8 07/24/2014 1205   EOSABS 0.6 07/24/2014 1205   BASOSABS 0.0 07/24/2014 1205   CMP     Component Value Date/Time   NA 136 07/24/2014 1205   K 4.7 07/24/2014 1205   CL 104 07/24/2014 1205   CO2 26 07/24/2014 1205   GLUCOSE 103* 07/24/2014 1205   BUN 24* 07/24/2014 1205   CREATININE 1.75* 07/24/2014 1205   CREATININE 1.39* 02/06/2014 1012   CALCIUM 9.1 07/24/2014 1205   PROT 7.7 07/24/2014 1205   ALBUMIN 3.7 07/24/2014 1205   AST 26 07/24/2014 1205   ALT 21 07/24/2014 1205   ALKPHOS 66 07/24/2014 1205   BILITOT 0.3 07/24/2014 1205  GFRNONAA 39* 07/24/2014 1205   GFRAA 25* 07/24/2014 1205     ASSESSMENT and THERAPY PLAN:   Stage IV prostate cancer, hormone sensitive disease. Bilateral nephrostomy tubes Bilateral orchiectomy History of polysubstance abuse  Anemia, multifactorial    69 year old male with stage IV adenocarcinoma of the prostate, hormone sensitive disease.He has completed Taxotere with an excellent response. His nephrostomy tubes are out.   He had a bilateral orchiectomy. He continues on XGEVA monthly. He was again advised to take calcium and vitamin D daily.   I discussed with him ongoing surveillance after completion of his chemotherapy. I will see him again in 3 months with repeat labs including a PSA. He will continue with monthly XGEVA.   His anemia is improving off therapy.  We will continue to monitor.  I did release him to go back to work per his  request.  All questions were answered. The patient knows to call the clinic with any problems, questions or concerns. We can certainly see the patient much sooner if necessary. Molli Hazard MD 08/05/2014

## 2014-08-05 NOTE — Progress Notes (Signed)
Please see doctors encounter for more information 

## 2014-08-05 NOTE — Patient Instructions (Signed)
..  [place] at [facility] Discharge Instructions  RECOMMENDATIONS MADE BY THE CONSULTANT AND ANY TEST RESULTS WILL BE SENT TO YOUR REFERRING PHYSICIAN.  Continue treatment plan as discussed. Return in 3 months for lab work and to see Dr. Whitney Muse. Return monthly for lab work and [employer] injection.    Thank you for choosing [place] at [facility] to provide your oncology and hematology care.  To afford each patient quality time with our provider, please arrive at least 15 minutes before your scheduled appointment time.    You need to re-schedule your appointment should you arrive 10 or more minutes late.  We strive to give you quality time with our providers, and arriving late affects you and other patients whose appointments are after yours.  Also, if you no show three or more times for appointments you may be dismissed from the clinic at the providers discretion.     Again, thank you for choosing [facility].  Our hope is that these requests will decrease the amount of time that you wait before being seen by our physicians.       _____________________________________________________________  Should you have questions after your visit to [facility], please contact our office at [phone] between the hours of 8:30 a.m. and 4:30 p.m.  Voicemails left after 4:30 p.m. will not be returned until the following business day.  For prescription refill requests, have your pharmacy contact our office.

## 2014-08-07 ENCOUNTER — Encounter (HOSPITAL_COMMUNITY): Payer: Medicare Other

## 2014-08-21 ENCOUNTER — Encounter (HOSPITAL_BASED_OUTPATIENT_CLINIC_OR_DEPARTMENT_OTHER): Payer: Medicare Other

## 2014-08-21 VITALS — BP 131/68 | HR 69 | Temp 97.5°F | Resp 18

## 2014-08-21 DIAGNOSIS — C61 Malignant neoplasm of prostate: Secondary | ICD-10-CM | POA: Diagnosis not present

## 2014-08-21 DIAGNOSIS — Z87898 Personal history of other specified conditions: Secondary | ICD-10-CM | POA: Diagnosis not present

## 2014-08-21 DIAGNOSIS — J449 Chronic obstructive pulmonary disease, unspecified: Secondary | ICD-10-CM | POA: Diagnosis not present

## 2014-08-21 DIAGNOSIS — C7951 Secondary malignant neoplasm of bone: Secondary | ICD-10-CM

## 2014-08-21 DIAGNOSIS — M899 Disorder of bone, unspecified: Secondary | ICD-10-CM

## 2014-08-21 DIAGNOSIS — C779 Secondary and unspecified malignant neoplasm of lymph node, unspecified: Secondary | ICD-10-CM | POA: Diagnosis not present

## 2014-08-21 LAB — COMPREHENSIVE METABOLIC PANEL
ALK PHOS: 60 U/L (ref 39–117)
ALT: 14 U/L (ref 0–53)
AST: 23 U/L (ref 0–37)
Albumin: 3.5 g/dL (ref 3.5–5.2)
Anion gap: 7 (ref 5–15)
BUN: 20 mg/dL (ref 6–23)
CALCIUM: 8.8 mg/dL (ref 8.4–10.5)
CO2: 22 mmol/L (ref 19–32)
CREATININE: 2.03 mg/dL — AB (ref 0.50–1.35)
Chloride: 108 mmol/L (ref 96–112)
GFR calc non Af Amer: 32 mL/min — ABNORMAL LOW (ref >90)
GFR, EST AFRICAN AMERICAN: 37 mL/min — AB (ref >90)
Glucose, Bld: 93 mg/dL (ref 70–99)
Potassium: 4.4 mmol/L (ref 3.5–5.1)
SODIUM: 137 mmol/L (ref 135–145)
TOTAL PROTEIN: 7.5 g/dL (ref 6.0–8.3)
Total Bilirubin: 0.6 mg/dL (ref 0.3–1.2)

## 2014-08-21 MED ORDER — DENOSUMAB 120 MG/1.7ML ~~LOC~~ SOLN
120.0000 mg | Freq: Once | SUBCUTANEOUS | Status: AC
  Administered 2014-08-21: 120 mg via SUBCUTANEOUS
  Filled 2014-08-21: qty 1.7

## 2014-08-21 NOTE — Progress Notes (Signed)
Jeff [name] presents today for injection per MD orders. Xgeva 120 mg administered SQ in left Abdomen. Administration without incident. Patient tolerated well.

## 2014-08-21 NOTE — Progress Notes (Signed)
Labs drawn

## 2014-08-21 NOTE — Patient Instructions (Signed)
[place] at [facility] Discharge Instructions  RECOMMENDATIONS MADE BY THE CONSULTANT AND ANY TEST RESULTS WILL BE SENT TO YOUR REFERRING PHYSICIAN.  Xgeva injection today as ordered. Continue lab work and Xgeva injections every 4 weeks. Return as scheduled.  Thank you for choosing [place] at [facility] to provide your oncology and hematology care.  To afford each patient quality time with our provider, please arrive at least 15 minutes before your scheduled appointment time.    You need to re-schedule your appointment should you arrive 10 or more minutes late.  We strive to give you quality time with our providers, and arriving late affects you and other patients whose appointments are after yours.  Also, if you no show three or more times for appointments you may be dismissed from the clinic at the providers discretion.     Again, thank you for choosing [facility].  Our hope is that these requests will decrease the amount of time that you wait before being seen by our physicians.       _____________________________________________________________  Should you have questions after your visit to [facility], please contact our office at [phone] between the hours of 8:30 a.m. and 4:30 p.m.  Voicemails left after 4:30 p.m. will not be returned until the following business day.  For prescription refill requests, have your pharmacy contact our office.

## 2014-08-22 NOTE — Progress Notes (Signed)
This encounter was created in error - please disregard.

## 2014-09-18 ENCOUNTER — Encounter (HOSPITAL_COMMUNITY): Payer: Medicare Other | Attending: Hematology and Oncology

## 2014-09-18 ENCOUNTER — Encounter (HOSPITAL_COMMUNITY): Payer: Medicare Other

## 2014-09-18 ENCOUNTER — Encounter (HOSPITAL_COMMUNITY): Payer: Self-pay

## 2014-09-18 VITALS — BP 122/73 | HR 80 | Temp 97.5°F | Resp 18

## 2014-09-18 DIAGNOSIS — Z87898 Personal history of other specified conditions: Secondary | ICD-10-CM | POA: Diagnosis not present

## 2014-09-18 DIAGNOSIS — J449 Chronic obstructive pulmonary disease, unspecified: Secondary | ICD-10-CM | POA: Diagnosis not present

## 2014-09-18 DIAGNOSIS — C779 Secondary and unspecified malignant neoplasm of lymph node, unspecified: Secondary | ICD-10-CM | POA: Insufficient documentation

## 2014-09-18 DIAGNOSIS — Z95828 Presence of other vascular implants and grafts: Secondary | ICD-10-CM

## 2014-09-18 DIAGNOSIS — C61 Malignant neoplasm of prostate: Secondary | ICD-10-CM

## 2014-09-18 DIAGNOSIS — M899 Disorder of bone, unspecified: Secondary | ICD-10-CM

## 2014-09-18 DIAGNOSIS — C7951 Secondary malignant neoplasm of bone: Secondary | ICD-10-CM | POA: Insufficient documentation

## 2014-09-18 LAB — COMPREHENSIVE METABOLIC PANEL
ALT: 11 U/L — ABNORMAL LOW (ref 17–63)
AST: 25 U/L (ref 15–41)
Albumin: 3.4 g/dL — ABNORMAL LOW (ref 3.5–5.0)
Alkaline Phosphatase: 73 U/L (ref 38–126)
Anion gap: 6 (ref 5–15)
BUN: 19 mg/dL (ref 6–20)
CALCIUM: 8.2 mg/dL — AB (ref 8.9–10.3)
CHLORIDE: 105 mmol/L (ref 101–111)
CO2: 22 mmol/L (ref 22–32)
Creatinine, Ser: 1.83 mg/dL — ABNORMAL HIGH (ref 0.61–1.24)
GFR calc Af Amer: 42 mL/min — ABNORMAL LOW (ref >60)
GFR calc non Af Amer: 37 mL/min — ABNORMAL LOW (ref >60)
Glucose, Bld: 91 mg/dL (ref 65–99)
Potassium: 4.5 mmol/L (ref 3.5–5.1)
Sodium: 133 mmol/L — ABNORMAL LOW (ref 135–145)
TOTAL PROTEIN: 8.8 g/dL — AB (ref 6.5–8.1)
Total Bilirubin: 0.3 mg/dL (ref 0.3–1.2)

## 2014-09-18 MED ORDER — DENOSUMAB 120 MG/1.7ML ~~LOC~~ SOLN
120.0000 mg | Freq: Once | SUBCUTANEOUS | Status: AC
  Administered 2014-09-18: 120 mg via SUBCUTANEOUS
  Filled 2014-09-18: qty 1.7

## 2014-09-18 MED ORDER — SODIUM CHLORIDE 0.9 % IJ SOLN
10.0000 mL | INTRAMUSCULAR | Status: DC | PRN
  Administered 2014-09-18: 10 mL via INTRAVENOUS
  Filled 2014-09-18: qty 10

## 2014-09-18 MED ORDER — OXYCODONE HCL 5 MG PO TABS: 5.0000 mg | ORAL_TABLET | ORAL | Status: DC

## 2014-09-18 MED ORDER — HEPARIN SOD (PORK) LOCK FLUSH 100 UNIT/ML IV SOLN
INTRAVENOUS | Status: AC
Start: 2014-09-18 — End: 2014-09-18
  Filled 2014-09-18: qty 5

## 2014-09-18 MED ORDER — CALCIUM CARBONATE-VITAMIN D 500-200 MG-UNIT PO TABS: 2.0000 | ORAL_TABLET | Freq: Every day | ORAL | Status: DC

## 2014-09-18 MED ORDER — HEPARIN SOD (PORK) LOCK FLUSH 100 UNIT/ML IV SOLN
500.0000 [IU] | Freq: Once | INTRAVENOUS | Status: AC
  Administered 2014-09-18: 500 [IU] via INTRAVENOUS

## 2014-09-18 NOTE — Progress Notes (Signed)
Jeff [name] presented for Portacath access and flush.  Proper placement of portacath confirmed by CXR.  Portacath located right chest wall accessed with  H 20 needle.  Good blood return present. Portacath flushed with 46ml NS and 500U/78ml Heparin and needle removed intact.  Procedure tolerated well and without incident.    Shanon Brow Dearman's reason for visit today is for an injection and labs as scheduled per MD orders.  Labs were drawn prior to administration of ordered medication.   Jeff [name] also received x-geva in abdomen sq per MD orders; see [facility] for administration details.  Jeff [name] tolerated all procedures well and without incident; questions were answered and patient was discharged.

## 2014-09-18 NOTE — Patient Instructions (Signed)
[place] at [facility] Discharge Instructions  RECOMMENDATIONS MADE BY THE CONSULTANT AND ANY TEST RESULTS WILL BE SENT TO YOUR REFERRING PHYSICIAN.  Port flsuh today X-geva today Refill on pain medication Refill sent to your pharmacy for calcium Follow up as scheduled Please call the clinic if you have nay questions or concerns  Thank you for choosing [place] at [facility] to provide your oncology and hematology care.  To afford each patient quality time with our provider, please arrive at least 15 minutes before your scheduled appointment time.    You need to re-schedule your appointment should you arrive 10 or more minutes late.  We strive to give you quality time with our providers, and arriving late affects you and other patients whose appointments are after yours.  Also, if you no show three or more times for appointments you may be dismissed from the clinic at the providers discretion.     Again, thank you for choosing [facility].  Our hope is that these requests will decrease the amount of time that you wait before being seen by our physicians.       _____________________________________________________________  Should you have questions after your visit to [facility], please contact our office at [phone] between the hours of 8:30 a.m. and 4:30 p.m.  Voicemails left after 4:30 p.m. will not be returned until the following business day.  For prescription refill requests, have your pharmacy contact our office.

## 2014-09-18 NOTE — Progress Notes (Signed)
Please see port flush encounter for more information

## 2014-10-16 ENCOUNTER — Encounter (HOSPITAL_COMMUNITY): Payer: Self-pay | Admitting: Oncology

## 2014-10-16 ENCOUNTER — Encounter (HOSPITAL_BASED_OUTPATIENT_CLINIC_OR_DEPARTMENT_OTHER): Payer: Medicare Other

## 2014-10-16 ENCOUNTER — Encounter (HOSPITAL_COMMUNITY): Payer: Medicare Other

## 2014-10-16 ENCOUNTER — Encounter (HOSPITAL_COMMUNITY): Payer: Medicare Other | Attending: Hematology and Oncology | Admitting: Oncology

## 2014-10-16 VITALS — BP 121/61 | HR 69 | Temp 97.3°F | Resp 18 | Wt 167.0 lb

## 2014-10-16 DIAGNOSIS — M899 Disorder of bone, unspecified: Secondary | ICD-10-CM

## 2014-10-16 DIAGNOSIS — Z87898 Personal history of other specified conditions: Secondary | ICD-10-CM | POA: Diagnosis not present

## 2014-10-16 DIAGNOSIS — C779 Secondary and unspecified malignant neoplasm of lymph node, unspecified: Secondary | ICD-10-CM | POA: Insufficient documentation

## 2014-10-16 DIAGNOSIS — J449 Chronic obstructive pulmonary disease, unspecified: Secondary | ICD-10-CM | POA: Insufficient documentation

## 2014-10-16 DIAGNOSIS — C7951 Secondary malignant neoplasm of bone: Secondary | ICD-10-CM

## 2014-10-16 DIAGNOSIS — C61 Malignant neoplasm of prostate: Secondary | ICD-10-CM

## 2014-10-16 LAB — CBC WITH DIFFERENTIAL/PLATELET
BASOS ABS: 0 10*3/uL (ref 0.0–0.1)
Basophils Relative: 0 % (ref 0–1)
Eosinophils Absolute: 0.1 10*3/uL (ref 0.0–0.7)
Eosinophils Relative: 1 % (ref 0–5)
HCT: 33.2 % — ABNORMAL LOW (ref 39.0–52.0)
HEMOGLOBIN: 10.9 g/dL — AB (ref 13.0–17.0)
LYMPHS ABS: 1.4 10*3/uL (ref 0.7–4.0)
LYMPHS PCT: 23 % (ref 12–46)
MCH: 27.5 pg (ref 26.0–34.0)
MCHC: 32.8 g/dL (ref 30.0–36.0)
MCV: 83.6 fL (ref 78.0–100.0)
MONOS PCT: 10 % (ref 3–12)
Monocytes Absolute: 0.6 10*3/uL (ref 0.1–1.0)
NEUTROS ABS: 4 10*3/uL (ref 1.7–7.7)
NEUTROS PCT: 66 % (ref 43–77)
Platelets: 323 10*3/uL (ref 150–400)
RBC: 3.97 MIL/uL — ABNORMAL LOW (ref 4.22–5.81)
RDW: 15.4 % (ref 11.5–15.5)
WBC: 6 10*3/uL (ref 4.0–10.5)

## 2014-10-16 LAB — PSA: PSA: 3.54 ng/mL (ref 0.00–4.00)

## 2014-10-16 LAB — COMPREHENSIVE METABOLIC PANEL
ALBUMIN: 3.1 g/dL — AB (ref 3.5–5.0)
ALT: 18 U/L (ref 17–63)
AST: 31 U/L (ref 15–41)
Alkaline Phosphatase: 59 U/L (ref 38–126)
Anion gap: 9 (ref 5–15)
BUN: 18 mg/dL (ref 6–20)
CALCIUM: 8.6 mg/dL — AB (ref 8.9–10.3)
CO2: 23 mmol/L (ref 22–32)
CREATININE: 1.74 mg/dL — AB (ref 0.61–1.24)
Chloride: 102 mmol/L (ref 101–111)
GFR calc Af Amer: 45 mL/min — ABNORMAL LOW (ref >60)
GFR calc non Af Amer: 39 mL/min — ABNORMAL LOW (ref >60)
Glucose, Bld: 91 mg/dL (ref 65–99)
Potassium: 4.3 mmol/L (ref 3.5–5.1)
SODIUM: 134 mmol/L — AB (ref 135–145)
TOTAL PROTEIN: 8.8 g/dL — AB (ref 6.5–8.1)
Total Bilirubin: 0.4 mg/dL (ref 0.3–1.2)

## 2014-10-16 MED ORDER — DENOSUMAB 120 MG/1.7ML ~~LOC~~ SOLN
120.0000 mg | Freq: Once | SUBCUTANEOUS | Status: AC
  Administered 2014-10-16: 120 mg via SUBCUTANEOUS
  Filled 2014-10-16: qty 1.7

## 2014-10-16 NOTE — Addendum Note (Signed)
Addended by: Elenor Legato on: 10/16/2014 11:11 AM   Modules accepted: Orders

## 2014-10-16 NOTE — Progress Notes (Signed)
[name], MD [place] [address]  Prostate cancer metastatic to multiple sites - Plan: CBC with Differential, PSA  CURRENT THERAPY: S/P 6 cycles of Taxotere finishing on 06/26/2014.  Currently on Xgeva every 4 weeks.  INTERVAL HISTORY: Jeff [name] 69 y.o. male returns for followup of Stage IV prostate cancer, hormone sensitive disease, S/P 6 cycles of Taxotere (03/14/2014- 06/26/2014) with response to therapy.  I personally reviewed and went over laboratory results with the patient.  The results are noted within this dictation.  He denies any complaints.  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  He notes that occasionally, he takes a break during the work day, but otherwise is doing very well. He denies any pain or any new pains.  He reports a cold that has been ongoing for 48 hours.  He denies any cough, sputum production, fevers, sore throat, headaches, etc.  I have encouraged him to try OTC medication(s) to treat his symptoms.  Past Medical History  Diagnosis Date  . Acute renal failure 01/26/2014  . Anemia of chronic disease 01/26/2014  . Bilateral hydronephrosis 01/26/2014  . History of cocaine abuse 01/26/2014  . Homelessness 01/31/2014  . 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 Alcohol abuse   . UTI (lower urinary tract infection)   . Sepsis     has Bone lesion; Acute renal failure; Bilateral hydronephrosis; Anemia of chronic disease; History of cocaine abuse; Prostate cancer metastatic to multiple sites; Homelessness; Health care maintenance; Sepsis; UTI (lower urinary tract infection); Malnutrition of moderate degree; Bacteremia; and Pyrexia on his problem list.     has No Known Allergies.  Current Outpatient Prescriptions on File Prior to Visit  Medication Sig Dispense Refill  . acetaminophen (TYLENOL) 325 MG tablet Take 2 tablets (650 mg total) by mouth every 6 (six) hours as needed for mild pain (or Fever >/= 101).     . calcium-vitamin D (OSCAL WITH D) 500-200 MG-UNIT per tablet Take 2 tablets by mouth daily with breakfast. 60 tablet 11  . lidocaine-prilocaine (EMLA) cream Apply a quarter size amount to port site 1 hour prior to chemo. Do not rub in. Cover with plastic wrap. 30 g 3  . metoCLOPramide (REGLAN) 5 MG tablet The day after chemo take 1 tab four times a day x 2 days. Then may take 1 tab four times a day if needed for nausea/vomiting. 60 tablet 2  . oxyCODONE (OXY IR/ROXICODONE) 5 MG immediate release tablet Take 1 tablet (5 mg total) by mouth every 4 (four) hours. Up to 2 tablets every 4 hours to control pain. 60 tablet 0  . prochlorperazine (COMPAZINE) 10 MG tablet The day after chemo take 1 tab four times a day x 2 days. Then may take 1 tab four times a day if needed for nausea/vomiting. 60 tablet 2  . traMADol (ULTRAM) 50 MG tablet Take 1 tablet (50 mg total) by mouth every 6 (six) hours as needed. 30 tablet 0   Current Facility-Administered Medications on File Prior to Visit  Medication Dose Route Frequency Provider Last Rate Last Dose  . sodium chloride 0.9 % injection 10 mL  10 mL Intracatheter PRN Farrel Gobble, MD        Past Surgical History  Procedure Laterality Date  . Orchiectomy Bilateral 02/05/2014    Procedure: BILATERAL ORCHIECTOMY;  Surgeon: Festus Aloe, MD;  Location: [facility];  Service: Urology;  Laterality: Bilateral;  .  Portacath placement Right 03/12/14  . Percutaneous nephrostomy Bilateral     IR Dr. Junious Silk changed on 04/22/2014    Denies any headaches, dizziness, double vision, fevers, chills, night sweats, nausea, vomiting, diarrhea, constipation, chest pain, heart palpitations, shortness of breath, blood in stool, black tarry stool, urinary pain, urinary burning, urinary frequency, hematuria.   PHYSICAL EXAMINATION  ECOG PERFORMANCE STATUS: 1 - Symptomatic but completely ambulatory  Filed Vitals:   10/16/14 1019  BP: 121/61  Pulse: 69  Temp: 97.3 F  (36.3 C)  Resp: 18    GENERAL:alert, no distress, well nourished, well developed, comfortable, cooperative and smiling SKIN: skin color, texture, turgor are normal, no rashes or significant lesions HEAD: Normocephalic, No masses, lesions, tenderness or abnormalities EYES: normal, PERRLA, EOMI, Conjunctiva are pink and non-injected EARS: External ears normal OROPHARYNX:lips, buccal mucosa, and tongue normal and mucous membranes are moist  NECK: supple, trachea midline LYMPH:  no palpable lymphadenopathy BREAST:not examined LUNGS: clear to auscultation  HEART: regular rate & rhythm, no murmurs, no gallops, S1 normal and S2 normal ABDOMEN:abdomen soft, non-tender, normal bowel sounds and no masses or organomegaly BACK: Back symmetric, no curvature. EXTREMITIES:less then 2 second capillary refill, no joint deformities, effusion, or inflammation, no skin discoloration, no cyanosis  NEURO: alert & oriented x 3 with fluent speech, no focal motor/sensory deficits, gait normal   LABORATORY DATA: CBC    Component Value Date/Time   WBC 6.4 07/24/2014 1205   RBC 3.44* 07/24/2014 1205   HGB 10.0* 07/24/2014 1205   HCT 32.0* 07/24/2014 1205   PLT 362 07/24/2014 1205   MCV 93.0 07/24/2014 1205   MCH 29.1 07/24/2014 1205   MCHC 31.3 07/24/2014 1205   RDW 17.1* 07/24/2014 1205   LYMPHSABS 1.7 07/24/2014 1205   MONOABS 0.8 07/24/2014 1205   EOSABS 0.6 07/24/2014 1205   BASOSABS 0.0 07/24/2014 1205      Chemistry      Component Value Date/Time   NA 134* 10/16/2014 1012   K 4.3 10/16/2014 1012   CL 102 10/16/2014 1012   CO2 23 10/16/2014 1012   BUN 18 10/16/2014 1012   CREATININE 1.74* 10/16/2014 1012   CREATININE 1.39* 02/06/2014 1012      Component Value Date/Time   CALCIUM 8.6* 10/16/2014 1012   ALKPHOS 59 10/16/2014 1012   AST 31 10/16/2014 1012   ALT 18 10/16/2014 1012   BILITOT 0.4 10/16/2014 1012        ASSESSMENT AND PLAN:  Prostate cancer metastatic to multiple  sites Stage IV prostate cancer, hormone sensitive disease, S/P 6 cycles of Taxotere (03/14/2014- 06/26/2014) with response to therapy.  He is continued on Xgeva monthly. Now undergoing surveillance with PSA testing.  Labs today; CBC diff, CMET, PSA  Labs every 4 weeks: CMET  Delton See is due today and every 4 weeks.  Return in 3 months for follow-up.    THERAPY PLAN:  Continue active surveillance and monthly Xgeva.  All questions were answered. The patient knows to call the clinic with any problems, questions or concerns. We can certainly see the patient much sooner if necessary.  Patient and plan discussed with Dr. Ancil Linsey and she is in agreement with the aforementioned.   This note is electronically signed by: Doy Mince 10/16/2014 11:08 AM

## 2014-10-16 NOTE — Assessment & Plan Note (Signed)
Stage IV prostate cancer, hormone sensitive disease, S/P 6 cycles of Taxotere (03/14/2014- 06/26/2014) with response to therapy.  He is continued on Xgeva monthly. Now undergoing surveillance with PSA testing.  Labs today; CBC diff, CMET, PSA  Labs every 4 weeks: CMET  Delton See is due today and every 4 weeks.  Return in 3 months for follow-up.

## 2014-10-16 NOTE — Progress Notes (Signed)
Jeff [name]'s reason for visit today is for an injection and labs as scheduled per MD orders.  Labs were drawn prior to administration of ordered medication.    Jeff [name] also received x-geva in abdomen sq per MD orders; see [facility] for administration details.  Jeff [name] tolerated all procedures well and without incident; questions were answered and patient was discharged.

## 2014-10-16 NOTE — Patient Instructions (Signed)
[place] at [facility] Discharge Instructions  RECOMMENDATIONS MADE BY THE CONSULTANT AND ANY TEST RESULTS WILL BE SENT TO YOUR REFERRING PHYSICIAN.  Exam completed Kirby Crigler today X-geva today and in 4 weeks Return to see the doctor in 3 months with lab work Port flushes every 6-8 weeks Please call the clinic if you have any questions or concerns   Thank you for choosing [place] at [facility] to provide your oncology and hematology care.  To afford each patient quality time with our provider, please arrive at least 15 minutes before your scheduled appointment time.    You need to re-schedule your appointment should you arrive 10 or more minutes late.  We strive to give you quality time with our providers, and arriving late affects you and other patients whose appointments are after yours.  Also, if you no show three or more times for appointments you may be dismissed from the clinic at the providers discretion.     Again, thank you for choosing [facility].  Our hope is that these requests will decrease the amount of time that you wait before being seen by our physicians.       _____________________________________________________________  Should you have questions after your visit to [facility], please contact our office at [phone] between the hours of 8:30 a.m. and 4:30 p.m.  Voicemails left after 4:30 p.m. will not be returned until the following business day.  For prescription refill requests, have your pharmacy contact our office.

## 2014-10-17 NOTE — Progress Notes (Signed)
Labs drawn

## 2014-10-30 ENCOUNTER — Ambulatory Visit (HOSPITAL_COMMUNITY): Payer: Medicare Other | Admitting: Oncology

## 2014-11-13 ENCOUNTER — Encounter (HOSPITAL_COMMUNITY): Payer: Medicare Other | Attending: Hematology and Oncology

## 2014-11-13 ENCOUNTER — Encounter (HOSPITAL_COMMUNITY): Payer: Self-pay

## 2014-11-13 ENCOUNTER — Encounter (HOSPITAL_BASED_OUTPATIENT_CLINIC_OR_DEPARTMENT_OTHER): Payer: Medicare Other

## 2014-11-13 DIAGNOSIS — M899 Disorder of bone, unspecified: Secondary | ICD-10-CM

## 2014-11-13 DIAGNOSIS — Z87898 Personal history of other specified conditions: Secondary | ICD-10-CM | POA: Diagnosis not present

## 2014-11-13 DIAGNOSIS — C61 Malignant neoplasm of prostate: Secondary | ICD-10-CM

## 2014-11-13 DIAGNOSIS — C7951 Secondary malignant neoplasm of bone: Secondary | ICD-10-CM | POA: Insufficient documentation

## 2014-11-13 DIAGNOSIS — C779 Secondary and unspecified malignant neoplasm of lymph node, unspecified: Secondary | ICD-10-CM | POA: Diagnosis not present

## 2014-11-13 DIAGNOSIS — J449 Chronic obstructive pulmonary disease, unspecified: Secondary | ICD-10-CM | POA: Insufficient documentation

## 2014-11-13 LAB — COMPREHENSIVE METABOLIC PANEL
ALT: 17 U/L (ref 17–63)
AST: 31 U/L (ref 15–41)
Albumin: 3.1 g/dL — ABNORMAL LOW (ref 3.5–5.0)
Alkaline Phosphatase: 68 U/L (ref 38–126)
Anion gap: 9 (ref 5–15)
BUN: 25 mg/dL — AB (ref 6–20)
CO2: 24 mmol/L (ref 22–32)
Calcium: 9.2 mg/dL (ref 8.9–10.3)
Chloride: 103 mmol/L (ref 101–111)
Creatinine, Ser: 1.72 mg/dL — ABNORMAL HIGH (ref 0.61–1.24)
GFR calc non Af Amer: 39 mL/min — ABNORMAL LOW (ref >60)
GFR, EST AFRICAN AMERICAN: 46 mL/min — AB (ref >60)
Glucose, Bld: 100 mg/dL — ABNORMAL HIGH (ref 65–99)
Potassium: 4.5 mmol/L (ref 3.5–5.1)
Sodium: 136 mmol/L (ref 135–145)
TOTAL PROTEIN: 8.8 g/dL — AB (ref 6.5–8.1)
Total Bilirubin: 0.3 mg/dL (ref 0.3–1.2)

## 2014-11-13 MED ORDER — SODIUM CHLORIDE 0.9 % IJ SOLN
10.0000 mL | INTRAMUSCULAR | Status: DC | PRN
  Administered 2014-11-13: 10 mL via INTRAVENOUS
  Filled 2014-11-13: qty 10

## 2014-11-13 MED ORDER — HEPARIN SOD (PORK) LOCK FLUSH 100 UNIT/ML IV SOLN
500.0000 [IU] | Freq: Once | INTRAVENOUS | Status: AC
  Administered 2014-11-13: 500 [IU] via INTRAVENOUS

## 2014-11-13 MED ORDER — OXYCODONE HCL 5 MG PO TABS: 5.0000 mg | ORAL_TABLET | ORAL | Status: DC

## 2014-11-13 MED ORDER — DENOSUMAB 120 MG/1.7ML ~~LOC~~ SOLN
120.0000 mg | Freq: Once | SUBCUTANEOUS | Status: AC
  Administered 2014-11-13: 120 mg via SUBCUTANEOUS
  Filled 2014-11-13: qty 1.7

## 2014-11-13 MED ORDER — HEPARIN SOD (PORK) LOCK FLUSH 100 UNIT/ML IV SOLN
INTRAVENOUS | Status: AC
  Filled 2014-11-13: qty 5

## 2014-11-13 NOTE — Progress Notes (Signed)
See injection encounter. 

## 2014-11-13 NOTE — Progress Notes (Signed)
..  Jeff [name] presented for Portacath access and flush. Portacath located rt chest wall accessed with  H 20 needle.  Good blood return present. Portacath flushed with 32ml NS and 500U/4ml Heparin and needle removed intact.  Procedure tolerated well and without incident. [name] KitchenAdriana [name] presents today for injection per the provider's orders.  xgeva administration without incident; see MAR for injection details.  Patient tolerated procedure well and without incident.  No questions or complaints noted at this time.

## 2014-11-13 NOTE — Patient Instructions (Signed)
..  [place] at [facility] Discharge Instructions  Delton See given today. We will give you this monthly Port flush today and we will flush your port every other time you come Thank you for choosing [place] at [facility] to provide your oncology and hematology care.  To afford each patient quality time with our provider, please arrive at least 15 minutes before your scheduled appointment time.    You need to re-schedule your appointment should you arrive 10 or more minutes late.  We strive to give you quality time with our providers, and arriving late affects you and other patients whose appointments are after yours.  Also, if you no show three or more times for appointments you may be dismissed from the clinic at the providers discretion.     Again, thank you for choosing [facility].  Our hope is that these requests will decrease the amount of time that you wait before being seen by our physicians.       _____________________________________________________________  Should you have questions after your visit to [facility], please contact our office at [phone] between the hours of 8:30 a.m. and 4:30 p.m.  Voicemails left after 4:30 p.m. will not be returned until the following business day.  For prescription refill requests, have your pharmacy contact our office.

## 2014-11-27 ENCOUNTER — Other Ambulatory Visit (HOSPITAL_COMMUNITY): Payer: Self-pay

## 2014-12-11 ENCOUNTER — Encounter (HOSPITAL_COMMUNITY): Payer: Medicare Other | Attending: Hematology and Oncology

## 2014-12-11 ENCOUNTER — Encounter (HOSPITAL_COMMUNITY): Payer: Medicare Other

## 2014-12-11 ENCOUNTER — Encounter (HOSPITAL_COMMUNITY): Payer: Self-pay

## 2014-12-11 VITALS — BP 116/73 | HR 96 | Temp 98.0°F | Resp 20

## 2014-12-11 DIAGNOSIS — C779 Secondary and unspecified malignant neoplasm of lymph node, unspecified: Secondary | ICD-10-CM | POA: Insufficient documentation

## 2014-12-11 DIAGNOSIS — C61 Malignant neoplasm of prostate: Secondary | ICD-10-CM

## 2014-12-11 DIAGNOSIS — Z87898 Personal history of other specified conditions: Secondary | ICD-10-CM | POA: Diagnosis not present

## 2014-12-11 DIAGNOSIS — J449 Chronic obstructive pulmonary disease, unspecified: Secondary | ICD-10-CM | POA: Insufficient documentation

## 2014-12-11 DIAGNOSIS — C7951 Secondary malignant neoplasm of bone: Secondary | ICD-10-CM | POA: Insufficient documentation

## 2014-12-11 DIAGNOSIS — M899 Disorder of bone, unspecified: Secondary | ICD-10-CM

## 2014-12-11 LAB — COMPREHENSIVE METABOLIC PANEL
ALBUMIN: 3.1 g/dL — AB (ref 3.5–5.0)
ALK PHOS: 62 U/L (ref 38–126)
ALT: 14 U/L — AB (ref 17–63)
ANION GAP: 7 (ref 5–15)
AST: 27 U/L (ref 15–41)
BILIRUBIN TOTAL: 0.4 mg/dL (ref 0.3–1.2)
BUN: 20 mg/dL (ref 6–20)
CALCIUM: 8.9 mg/dL (ref 8.9–10.3)
CO2: 25 mmol/L (ref 22–32)
CREATININE: 1.87 mg/dL — AB (ref 0.61–1.24)
Chloride: 105 mmol/L (ref 101–111)
GFR calc Af Amer: 41 mL/min — ABNORMAL LOW (ref >60)
GFR calc non Af Amer: 36 mL/min — ABNORMAL LOW (ref >60)
GLUCOSE: 110 mg/dL — AB (ref 65–99)
Potassium: 4.5 mmol/L (ref 3.5–5.1)
SODIUM: 137 mmol/L (ref 135–145)
TOTAL PROTEIN: 8.8 g/dL — AB (ref 6.5–8.1)

## 2014-12-11 MED ORDER — DENOSUMAB 120 MG/1.7ML ~~LOC~~ SOLN
120.0000 mg | Freq: Once | SUBCUTANEOUS | Status: AC
  Administered 2014-12-11: 120 mg via SUBCUTANEOUS
  Filled 2014-12-11: qty 1.7

## 2014-12-11 NOTE — Patient Instructions (Signed)
[place] at [facility] Discharge Instructions  RECOMMENDATIONS MADE BY THE CONSULTANT AND ANY TEST RESULTS WILL BE SENT TO YOUR REFERRING PHYSICIAN.  Xgeva injection today. Return as scheduled for lab work, injection, and office visit.  Thank you for choosing [place] at [facility] to provide your oncology and hematology care.  To afford each patient quality time with our provider, please arrive at least 15 minutes before your scheduled appointment time.    You need to re-schedule your appointment should you arrive 10 or more minutes late.  We strive to give you quality time with our providers, and arriving late affects you and other patients whose appointments are after yours.  Also, if you no show three or more times for appointments you may be dismissed from the clinic at the providers discretion.     Again, thank you for choosing [facility].  Our hope is that these requests will decrease the amount of time that you wait before being seen by our physicians.       _____________________________________________________________  Should you have questions after your visit to [facility], please contact our office at [phone] between the hours of 8:30 a.m. and 4:30 p.m.  Voicemails left after 4:30 p.m. will not be returned until the following business day.  For prescription refill requests, have your pharmacy contact our office.

## 2014-12-11 NOTE — Progress Notes (Signed)
Jeff [name] presents today for injection per the provider's orders.  Xgeva administration without incident; see MAR for injection details.  Patient tolerated procedure well and without incident.  No questions or complaints noted at this time. 

## 2015-01-08 ENCOUNTER — Encounter (HOSPITAL_COMMUNITY): Payer: Medicare Other

## 2015-01-08 ENCOUNTER — Encounter (HOSPITAL_COMMUNITY): Payer: Medicare Other | Attending: Hematology and Oncology | Admitting: Hematology & Oncology

## 2015-01-08 ENCOUNTER — Encounter (HOSPITAL_COMMUNITY): Payer: Self-pay | Admitting: Hematology & Oncology

## 2015-01-08 VITALS — BP 118/74 | HR 68 | Temp 97.5°F | Resp 16 | Wt 162.2 lb

## 2015-01-08 DIAGNOSIS — Z9889 Other specified postprocedural states: Secondary | ICD-10-CM

## 2015-01-08 DIAGNOSIS — C61 Malignant neoplasm of prostate: Secondary | ICD-10-CM

## 2015-01-08 DIAGNOSIS — C779 Secondary and unspecified malignant neoplasm of lymph node, unspecified: Secondary | ICD-10-CM | POA: Diagnosis not present

## 2015-01-08 DIAGNOSIS — Z95828 Presence of other vascular implants and grafts: Secondary | ICD-10-CM

## 2015-01-08 DIAGNOSIS — Z87898 Personal history of other specified conditions: Secondary | ICD-10-CM | POA: Diagnosis not present

## 2015-01-08 DIAGNOSIS — J449 Chronic obstructive pulmonary disease, unspecified: Secondary | ICD-10-CM | POA: Diagnosis not present

## 2015-01-08 DIAGNOSIS — M899 Disorder of bone, unspecified: Secondary | ICD-10-CM | POA: Diagnosis not present

## 2015-01-08 DIAGNOSIS — C7951 Secondary malignant neoplasm of bone: Secondary | ICD-10-CM | POA: Insufficient documentation

## 2015-01-08 LAB — COMPREHENSIVE METABOLIC PANEL
ALBUMIN: 3.3 g/dL — AB (ref 3.5–5.0)
ALK PHOS: 59 U/L (ref 38–126)
ALT: 20 U/L (ref 17–63)
AST: 29 U/L (ref 15–41)
Anion gap: 6 (ref 5–15)
BUN: 21 mg/dL — ABNORMAL HIGH (ref 6–20)
CALCIUM: 8.8 mg/dL — AB (ref 8.9–10.3)
CO2: 24 mmol/L (ref 22–32)
CREATININE: 1.55 mg/dL — AB (ref 0.61–1.24)
Chloride: 105 mmol/L (ref 101–111)
GFR calc Af Amer: 52 mL/min — ABNORMAL LOW (ref >60)
GFR calc non Af Amer: 45 mL/min — ABNORMAL LOW (ref >60)
GLUCOSE: 94 mg/dL (ref 65–99)
Potassium: 4.7 mmol/L (ref 3.5–5.1)
SODIUM: 135 mmol/L (ref 135–145)
Total Bilirubin: 0.3 mg/dL (ref 0.3–1.2)
Total Protein: 8.7 g/dL — ABNORMAL HIGH (ref 6.5–8.1)

## 2015-01-08 LAB — CBC WITH DIFFERENTIAL/PLATELET
BASOS ABS: 0 10*3/uL (ref 0.0–0.1)
BASOS PCT: 0 %
EOS ABS: 0.1 10*3/uL (ref 0.0–0.7)
Eosinophils Relative: 2 %
HCT: 29.5 % — ABNORMAL LOW (ref 39.0–52.0)
HEMOGLOBIN: 9.4 g/dL — AB (ref 13.0–17.0)
LYMPHS ABS: 1.2 10*3/uL (ref 0.7–4.0)
Lymphocytes Relative: 19 %
MCH: 27.2 pg (ref 26.0–34.0)
MCHC: 31.9 g/dL (ref 30.0–36.0)
MCV: 85.5 fL (ref 78.0–100.0)
Monocytes Absolute: 0.6 10*3/uL (ref 0.1–1.0)
Monocytes Relative: 10 %
NEUTROS PCT: 69 %
Neutro Abs: 4.2 10*3/uL (ref 1.7–7.7)
Platelets: 323 10*3/uL (ref 150–400)
RBC: 3.45 MIL/uL — AB (ref 4.22–5.81)
RDW: 19.1 % — ABNORMAL HIGH (ref 11.5–15.5)
WBC: 6.2 10*3/uL (ref 4.0–10.5)

## 2015-01-08 LAB — PSA: PSA: 2.13 ng/mL (ref 0.00–4.00)

## 2015-01-08 MED ORDER — OXYCODONE HCL 5 MG PO TABS
5.0000 mg | ORAL_TABLET | ORAL | Status: DC
Start: 2015-01-08 — End: 2015-03-05

## 2015-01-08 MED ORDER — SODIUM CHLORIDE 0.9 % IJ SOLN
10.0000 mL | Freq: Once | INTRAMUSCULAR | Status: AC
  Administered 2015-01-08: 10 mL via INTRAVENOUS

## 2015-01-08 MED ORDER — HEPARIN SOD (PORK) LOCK FLUSH 100 UNIT/ML IV SOLN
500.0000 [IU] | Freq: Once | INTRAVENOUS | Status: AC
  Administered 2015-01-08: 500 [IU] via INTRAVENOUS

## 2015-01-08 MED ORDER — HEPARIN SOD (PORK) LOCK FLUSH 100 UNIT/ML IV SOLN
INTRAVENOUS | Status: AC
  Filled 2015-01-08: qty 5

## 2015-01-08 NOTE — Progress Notes (Signed)
See office visit encounter. 

## 2015-01-08 NOTE — Progress Notes (Signed)
[name], MD [place] [address]  Stage IV Adenocarcinoma of the prostate, Hormone Sensitive Disease Bilateral orchiectomy 01/2014 Taxotere/Xgeva started 02/2014 Bilateral nephrostomy tube placement 01/2014 Nephrostomy tube removal 07/23/2014  CURRENT THERAPY:Taxotere/Xgeva  INTERVAL HISTORY: Jeff [name] 69 y.o. male returns for follow-up of his hormone sensitive stage IV prostate cancer. He has completed Taxotere. He is doing remarkably well. He has had a bilateral orchiectomy.   [name] is here alone today. He states that he's been eating every day, but he is down about 20lbs since February. He's taking his calcium, and isn't experiencing any new pain anywhere. He remarks that since he is no longer "living at the care home" he does not eat as often as well. He also notes that he is very active.  He says he's working 8, 10, 12 hours a day, five or 6 days a week. He says he enjoys working, and that the people he works with are supportive. When he experiences pain, it's mostly in his back from bending over and climbing ladders.  He remarks that he's been able to urinate fine lately. He denies any hematuria. He denies any new pain. He is having dental work, he has not told his dentist he is on [place]. He has 4 teeth in the lower jaw that are being removed.  MEDICAL HISTORY: Past Medical History  Diagnosis Date  . Acute renal failure 01/26/2014  . Anemia of chronic disease 01/26/2014  . Bilateral hydronephrosis 01/26/2014  . History of cocaine abuse 01/26/2014  . Homelessness 01/31/2014  . 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 Alcohol abuse   . UTI (lower urinary tract infection)   . Sepsis     has Bone lesion; Acute renal failure; Bilateral hydronephrosis; Anemia of chronic disease; History of cocaine abuse; Prostate cancer metastatic to multiple sites; Homelessness; Health care maintenance; Sepsis; UTI (lower urinary  tract infection); Malnutrition of moderate degree; Bacteremia; and Pyrexia on his problem list.      Prostate cancer metastatic to multiple sites   01/26/2014 Tumor Marker PSA > 5000   01/28/2014 Initial Biopsy Metastatic adenocarcinoma of prostate   02/05/2014 Surgery Bilateral orchiectomy by Dr. Junious Silk   02/26/2014 Tumor Marker PSA = 399.5   03/14/2014 Tumor Marker PSA= 89.51   03/14/2014 - 06/26/2014 Chemotherapy Docetaxel 75 mg/kg every 21 days   04/24/2014 Tumor Marker PSA- 19.89   07/24/2014 Tumor Marker PSA= 6.15     has No Known Allergies.  [name] does not currently have medications on file.  SURGICAL HISTORY: Past Surgical History  Procedure Laterality Date  . Orchiectomy Bilateral 02/05/2014    Procedure: BILATERAL ORCHIECTOMY;  Surgeon: Festus Aloe, MD;  Location: [facility];  Service: Urology;  Laterality: Bilateral;  . Portacath placement Right 03/12/14  . Percutaneous nephrostomy Bilateral     IR Dr. Junious Silk changed on 04/22/2014    SOCIAL HISTORY: Social History   Social History  . Marital Status: Single    Spouse Name: N/A  . Number of Children: 1  . Years of Education: 9th   Occupational History  . disabilty    Social History Main Topics  . Smoking status: Current Every Day Smoker -- 0.25 packs/day for 55 years    Types: Cigarettes  . Smokeless tobacco: Never Used  . Alcohol Use: No     Comment: none for at least 7 -8 years  . Drug Use: Yes    Special: Cocaine  Comment: last used Cocaine 01/25/14  . Sexual Activity: Yes   Other Topics Concern  . Not on file   Social History Narrative   Lives at [employer] 9th grade   1 son, lives in [place], [place]   Can read and write in native language             FAMILY HISTORY: Family History  Problem Relation Age of Onset  . Cancer Brother 60    Review of Systems  Constitutional: Negative for fever, chills, weight loss and malaise/fatigue.  HENT: Negative for  congestion, hearing loss, nosebleeds, sore throat and tinnitus.   Eyes: Negative for blurred vision, double vision, pain and discharge.  Respiratory: Negative for cough, hemoptysis, sputum production, shortness of breath and wheezing.   Cardiovascular: Negative for chest pain, palpitations, claudication, leg swelling and PND.  Gastrointestinal: Negative for heartburn, nausea, vomiting, abdominal pain, diarrhea, constipation, blood in stool and melena.  Genitourinary: Negative for dysuria, urgency, frequency and hematuria.  Musculoskeletal: Positive for back pain. Negative for myalgias, joint pain and falls.  Skin: Negative for itching and rash.  Neurological: Negative for dizziness, tingling, tremors, sensory change, speech change, focal weakness, seizures, loss of consciousness, weakness and headaches.  Endo/Heme/Allergies: Does not bruise/bleed easily.  Psychiatric/Behavioral: Negative for depression, suicidal ideas, memory loss and substance abuse. The patient is not nervous/anxious and does not have insomnia.    14 point review of systems was performed and is negative except as detailed under history of present illness and above  PHYSICAL EXAMINATION  ECOG PERFORMANCE STATUS: 0 - Asymptomatic  Filed Vitals:   01/08/15 1049  BP: 118/74  Pulse: 68  Temp: 97.5 F (36.4 C)  Resp: 16    Physical Exam  Constitutional: He is oriented to person, place, and time and well-developed, well-nourished, and in no distress.  HENT:  Head: Normocephalic and atraumatic.  Nose: Nose normal.  Mouth/Throat: Oropharynx is clear and moist. No oropharyngeal exudate.  Poor dentition  Eyes: Conjunctivae and EOM are normal. Pupils are equal, round, and reactive to light. Right eye exhibits no discharge. Left eye exhibits no discharge. No scleral icterus.  Neck: Normal range of motion. Neck supple. No tracheal deviation present. No thyromegaly present.  Cardiovascular: Normal rate, regular rhythm and  normal heart sounds.  Exam reveals no gallop and no friction rub.   No murmur heard. Pulmonary/Chest: Effort normal and breath sounds normal. He has no wheezes. He has no rales.  Abdominal: Soft. Bowel sounds are normal. He exhibits no distension and no mass. There is no tenderness. There is no rebound and no guarding.  Genitourinary:  Nephrostomy tube sites are well healed  Musculoskeletal: Normal range of motion. He exhibits no edema.  Lymphadenopathy:    He has no cervical adenopathy.  Neurological: He is alert and oriented to person, place, and time. He has normal reflexes. No cranial nerve deficit. Gait normal. Coordination normal.  Skin: Skin is warm and dry. No rash noted.  Psychiatric: Mood, memory, affect and judgment normal.  Nursing note and vitals reviewed.   LABORATORY DATA:  I have reviewed the labs below.  CBC    Component Value Date/Time   WBC 6.2 01/08/2015 1106   RBC 3.45* 01/08/2015 1106   HGB 9.4* 01/08/2015 1106   HCT 29.5* 01/08/2015 1106   PLT 323 01/08/2015 1106   MCV 85.5 01/08/2015 1106   MCH 27.2 01/08/2015 1106   MCHC 31.9 01/08/2015 1106   RDW 19.1* 01/08/2015 1106  LYMPHSABS 1.2 01/08/2015 1106   MONOABS 0.6 01/08/2015 1106   EOSABS 0.1 01/08/2015 1106   BASOSABS 0.0 01/08/2015 1106   CMP     Component Value Date/Time   NA 135 01/08/2015 1106   K 4.7 01/08/2015 1106   CL 105 01/08/2015 1106   CO2 24 01/08/2015 1106   GLUCOSE 94 01/08/2015 1106   BUN 21* 01/08/2015 1106   CREATININE 1.55* 01/08/2015 1106   CREATININE 1.39* 02/06/2014 1012   CALCIUM 8.8* 01/08/2015 1106   PROT 8.7* 01/08/2015 1106   ALBUMIN 3.3* 01/08/2015 1106   AST 29 01/08/2015 1106   ALT 20 01/08/2015 1106   ALKPHOS 59 01/08/2015 1106   BILITOT 0.3 01/08/2015 1106   GFRNONAA 45* 01/08/2015 1106   GFRAA 52* 01/08/2015 1106     ASSESSMENT and THERAPY PLAN:   Stage IV prostate cancer, hormone sensitive disease. Bilateral nephrostomy tubes Bilateral  orchiectomy History of polysubstance abuse  Anemia, multifactorial    69 year old male with stage IV adenocarcinoma of the prostate, hormone sensitive disease.He has completed Taxotere with an excellent response. His nephrostomy tubes are out.  He had a bilateral orchiectomy. He continues on XGEVA monthly. He was again advised to take calcium and vitamin D daily. Patient states he is very compliant with his medications. He is only asking for a pain medication refill once every 3 months. He says he has several days where he does not need any pain medication. He was provided with a note to give to his dentist tomorrow morning. I again reviewed the risks of dental extraction while he is on Xgeva therapy, including osteonecrosis of the jaw.  I discussed with him ongoing surveillance after completion of his chemotherapy. I will see him again in 3 months with repeat labs including a PSA. He will continue with monthly XGEVA; however it will be held today because of his pending dental work.  We will continue to monitor his anemia. It is overall stable.  I did release him to go back to work per his request. //  His last PSA was normal. I have recommended repeat imaging and he is agreeable. We will call him when his next PSA is available.I have refilled his pain medication.  All questions were answered. The patient knows to call the clinic with any problems, questions or concerns. We can certainly see the patient much sooner if necessary.  This document serves as a record of services personally performed by Ancil Linsey, MD. It was created on her behalf by Toni Amend, a trained medical scribe. The creation of this record is based on the scribe's personal observations and the provider's statements to them. This document has been checked and approved by the attending provider.  I have reviewed the above documentation for accuracy and completeness, and I agree with the above.  Molli Hazard, MD MD 01/08/2015

## 2015-01-08 NOTE — Progress Notes (Signed)
Jeff [name] presented for Portacath access and flush. Proper placement of portacath confirmed by CXR. Portacath located right chest wall accessed with  H 20 needle. Good blood return present. Portacath flushed with 17ml NS and 500U/43ml Heparin and needle removed intact. Procedure without incident. Patient tolerated procedure well.

## 2015-01-08 NOTE — Patient Instructions (Signed)
[place] at [facility] Discharge Instructions  RECOMMENDATIONS MADE BY THE CONSULTANT AND ANY TEST RESULTS WILL BE SENT TO YOUR REFERRING PHYSICIAN.  Port flush with lab work today. NO XGEVA TODAY due to dental procedures tomorrow. Be sure to inform your dentist that you have been taking Xgeva injections monthly. Last injection was August 17,2016. We will continue monthly lab work. Resume Xgeva injections in October. Port flush every 8 weeks.  CT scan as scheduled. Bone scan as scheduled. MD follow up in 3 months. Report any issues/concerns to clinic as needed. Return as scheduled. You were given a Armonie Mettler prescription for Oxycodone today.  Thank you for choosing [place] at [facility] to provide your oncology and hematology care.  To afford each patient quality time with our provider, please arrive at least 15 minutes before your scheduled appointment time.    You need to re-schedule your appointment should you arrive 10 or more minutes late.  We strive to give you quality time with our providers, and arriving late affects you and other patients whose appointments are after yours.  Also, if you no show three or more times for appointments you may be dismissed from the clinic at the providers discretion.     Again, thank you for choosing [facility].  Our hope is that these requests will decrease the amount of time that you wait before being seen by our physicians.       _____________________________________________________________  Should you have questions after your visit to [facility], please contact our office at [phone] between the hours of 8:30 a.m. and 4:30 p.m.  Voicemails left after 4:30 p.m. will not be returned until the following business day.  For prescription refill requests, have your pharmacy contact our office.

## 2015-01-16 ENCOUNTER — Other Ambulatory Visit (HOSPITAL_COMMUNITY): Payer: Medicare Other

## 2015-01-17 ENCOUNTER — Ambulatory Visit (HOSPITAL_COMMUNITY)
Admission: RE | Admit: 2015-01-17 | Discharge: 2015-01-17 | Disposition: A | Discharge status: Home / Self Care | Payer: Medicare Other | Source: Ambulatory Visit | Attending: Hematology & Oncology | Admitting: Hematology & Oncology

## 2015-01-17 ENCOUNTER — Encounter (HOSPITAL_COMMUNITY)
Admission: RE | Admit: 2015-01-17 | Discharge: 2015-01-17 | Disposition: A | Discharge status: Home / Self Care | Payer: Medicare Other | Source: Ambulatory Visit | Attending: Hematology & Oncology | Admitting: Hematology & Oncology

## 2015-01-17 ENCOUNTER — Encounter (HOSPITAL_COMMUNITY): Payer: Self-pay

## 2015-01-17 DIAGNOSIS — C419 Malignant neoplasm of bone and articular cartilage, unspecified: Secondary | ICD-10-CM | POA: Diagnosis not present

## 2015-01-17 DIAGNOSIS — Z08 Encounter for follow-up examination after completed treatment for malignant neoplasm: Secondary | ICD-10-CM | POA: Diagnosis not present

## 2015-01-17 DIAGNOSIS — Z8546 Personal history of malignant neoplasm of prostate: Secondary | ICD-10-CM | POA: Diagnosis not present

## 2015-01-17 DIAGNOSIS — C7951 Secondary malignant neoplasm of bone: Secondary | ICD-10-CM | POA: Insufficient documentation

## 2015-01-17 DIAGNOSIS — I2510 Atherosclerotic heart disease of native coronary artery without angina pectoris: Secondary | ICD-10-CM | POA: Diagnosis not present

## 2015-01-17 DIAGNOSIS — M899 Disorder of bone, unspecified: Secondary | ICD-10-CM

## 2015-01-17 DIAGNOSIS — C61 Malignant neoplasm of prostate: Secondary | ICD-10-CM | POA: Diagnosis not present

## 2015-01-17 MED ORDER — IOHEXOL 300 MG/ML  SOLN
100.0000 mL | Freq: Once | INTRAMUSCULAR | Status: AC | PRN
  Administered 2015-01-17: 100 mL via INTRAVENOUS

## 2015-01-17 MED ORDER — TECHNETIUM TC 99M MEDRONATE IV KIT
25.0000 | PACK | Freq: Once | INTRAVENOUS | Status: AC | PRN
  Administered 2015-01-17: 25 via INTRAVENOUS

## 2015-01-26 ENCOUNTER — Other Ambulatory Visit (HOSPITAL_COMMUNITY): Payer: Self-pay | Admitting: Hematology & Oncology

## 2015-01-26 DIAGNOSIS — R9389 Abnormal findings on diagnostic imaging of other specified body structures: Secondary | ICD-10-CM

## 2015-01-30 ENCOUNTER — Other Ambulatory Visit (HOSPITAL_COMMUNITY): Payer: Self-pay

## 2015-02-05 ENCOUNTER — Encounter (HOSPITAL_COMMUNITY): Payer: Medicare Other | Attending: Hematology & Oncology

## 2015-02-05 VITALS — BP 101/68 | HR 74 | Temp 98.0°F | Resp 16

## 2015-02-05 DIAGNOSIS — C61 Malignant neoplasm of prostate: Secondary | ICD-10-CM

## 2015-02-05 DIAGNOSIS — M899 Disorder of bone, unspecified: Secondary | ICD-10-CM

## 2015-02-05 DIAGNOSIS — C7951 Secondary malignant neoplasm of bone: Secondary | ICD-10-CM

## 2015-02-05 LAB — COMPREHENSIVE METABOLIC PANEL
ALT: 17 U/L (ref 17–63)
AST: 27 U/L (ref 15–41)
Albumin: 3.4 g/dL — ABNORMAL LOW (ref 3.5–5.0)
Alkaline Phosphatase: 66 U/L (ref 38–126)
Anion gap: 4 — ABNORMAL LOW (ref 5–15)
BILIRUBIN TOTAL: 0.3 mg/dL (ref 0.3–1.2)
BUN: 19 mg/dL (ref 6–20)
CO2: 26 mmol/L (ref 22–32)
CREATININE: 1.8 mg/dL — AB (ref 0.61–1.24)
Calcium: 9.2 mg/dL (ref 8.9–10.3)
Chloride: 104 mmol/L (ref 101–111)
GFR, EST AFRICAN AMERICAN: 43 mL/min — AB (ref >60)
GFR, EST NON AFRICAN AMERICAN: 37 mL/min — AB (ref >60)
Glucose, Bld: 88 mg/dL (ref 65–99)
POTASSIUM: 4.7 mmol/L (ref 3.5–5.1)
Sodium: 134 mmol/L — ABNORMAL LOW (ref 135–145)
TOTAL PROTEIN: 8.7 g/dL — AB (ref 6.5–8.1)

## 2015-02-05 MED ORDER — DENOSUMAB 120 MG/1.7ML ~~LOC~~ SOLN
120.0000 mg | Freq: Once | SUBCUTANEOUS | Status: AC
  Administered 2015-02-05: 120 mg via SUBCUTANEOUS
  Filled 2015-02-05: qty 1.7

## 2015-02-05 NOTE — Progress Notes (Signed)
Jeff [name] presents today for injection per MD orders. Xgeva administered SQ in right Abdomen. Administration without incident. Patient tolerated well.

## 2015-02-05 NOTE — Progress Notes (Signed)
Labs drawn

## 2015-03-05 ENCOUNTER — Encounter (HOSPITAL_COMMUNITY): Payer: Medicare Other | Attending: Hematology & Oncology

## 2015-03-05 ENCOUNTER — Encounter (HOSPITAL_COMMUNITY): Payer: Self-pay

## 2015-03-05 VITALS — BP 106/65 | HR 83 | Temp 98.0°F | Resp 18

## 2015-03-05 DIAGNOSIS — M899 Disorder of bone, unspecified: Secondary | ICD-10-CM

## 2015-03-05 DIAGNOSIS — C61 Malignant neoplasm of prostate: Secondary | ICD-10-CM | POA: Diagnosis not present

## 2015-03-05 DIAGNOSIS — C7951 Secondary malignant neoplasm of bone: Secondary | ICD-10-CM

## 2015-03-05 DIAGNOSIS — Z95828 Presence of other vascular implants and grafts: Secondary | ICD-10-CM

## 2015-03-05 LAB — COMPREHENSIVE METABOLIC PANEL
ALBUMIN: 3.2 g/dL — AB (ref 3.5–5.0)
ALT: 17 U/L (ref 17–63)
AST: 29 U/L (ref 15–41)
Alkaline Phosphatase: 64 U/L (ref 38–126)
Anion gap: 7 (ref 5–15)
BILIRUBIN TOTAL: 0.2 mg/dL — AB (ref 0.3–1.2)
BUN: 16 mg/dL (ref 6–20)
CALCIUM: 8.5 mg/dL — AB (ref 8.9–10.3)
CO2: 24 mmol/L (ref 22–32)
CREATININE: 1.5 mg/dL — AB (ref 0.61–1.24)
Chloride: 103 mmol/L (ref 101–111)
GFR, EST AFRICAN AMERICAN: 53 mL/min — AB (ref >60)
GFR, EST NON AFRICAN AMERICAN: 46 mL/min — AB (ref >60)
Glucose, Bld: 109 mg/dL — ABNORMAL HIGH (ref 65–99)
Potassium: 4.1 mmol/L (ref 3.5–5.1)
Sodium: 134 mmol/L — ABNORMAL LOW (ref 135–145)
TOTAL PROTEIN: 8.4 g/dL — AB (ref 6.5–8.1)

## 2015-03-05 MED ORDER — DENOSUMAB 120 MG/1.7ML ~~LOC~~ SOLN
120.0000 mg | Freq: Once | SUBCUTANEOUS | Status: AC
  Administered 2015-03-05: 120 mg via SUBCUTANEOUS
  Filled 2015-03-05: qty 1.7

## 2015-03-05 MED ORDER — HEPARIN SOD (PORK) LOCK FLUSH 100 UNIT/ML IV SOLN
500.0000 [IU] | Freq: Once | INTRAVENOUS | Status: AC
  Administered 2015-03-05: 500 [IU] via INTRAVENOUS

## 2015-03-05 MED ORDER — SODIUM CHLORIDE 0.9 % IJ SOLN
10.0000 mL | INTRAMUSCULAR | Status: DC | PRN
  Administered 2015-03-05: 10 mL via INTRAVENOUS
  Filled 2015-03-05: qty 10

## 2015-03-05 MED ORDER — OXYCODONE HCL 5 MG PO TABS: 5.0000 mg | ORAL_TABLET | ORAL | Status: DC

## 2015-03-05 NOTE — Progress Notes (Signed)
Jeff [name] presented for Portacath access and flush.  Proper placement of portacath confirmed by CXR.  Portacath located RIGHT chest wall accessed with  H 20 needle.  Good blood return present. Portacath flushed with 63ml NS and 500U/36ml Heparin and needle removed intact.  Procedure tolerated well and without incident.    [name]'s reason for visit today is for an injection and labs as scheduled per MD orders.  Labs were drawn prior to administration of ordered medication.   Jeff [name] also received X-GEVA per MD orders; see [facility] for administration details.  Jeff [name] tolerated all procedures well and without incident; questions were answered and patient was discharged.

## 2015-03-05 NOTE — Progress Notes (Signed)
See injection encounter. 

## 2015-04-02 ENCOUNTER — Encounter (HOSPITAL_BASED_OUTPATIENT_CLINIC_OR_DEPARTMENT_OTHER): Payer: Medicare Other

## 2015-04-02 ENCOUNTER — Encounter (HOSPITAL_COMMUNITY): Payer: Medicare Other | Attending: Hematology & Oncology | Admitting: Hematology & Oncology

## 2015-04-02 ENCOUNTER — Encounter (HOSPITAL_COMMUNITY): Payer: Medicare Other | Attending: Hematology & Oncology

## 2015-04-02 ENCOUNTER — Encounter (HOSPITAL_COMMUNITY): Payer: Self-pay | Admitting: Hematology & Oncology

## 2015-04-02 VITALS — BP 113/77 | HR 72 | Temp 98.0°F | Resp 18 | Wt 173.4 lb

## 2015-04-02 DIAGNOSIS — C7951 Secondary malignant neoplasm of bone: Secondary | ICD-10-CM

## 2015-04-02 DIAGNOSIS — D649 Anemia, unspecified: Secondary | ICD-10-CM

## 2015-04-02 DIAGNOSIS — M899 Disorder of bone, unspecified: Secondary | ICD-10-CM | POA: Diagnosis not present

## 2015-04-02 DIAGNOSIS — Z23 Encounter for immunization: Secondary | ICD-10-CM

## 2015-04-02 DIAGNOSIS — Z95828 Presence of other vascular implants and grafts: Secondary | ICD-10-CM

## 2015-04-02 DIAGNOSIS — C61 Malignant neoplasm of prostate: Secondary | ICD-10-CM | POA: Diagnosis not present

## 2015-04-02 DIAGNOSIS — R938 Abnormal findings on diagnostic imaging of other specified body structures: Secondary | ICD-10-CM | POA: Diagnosis not present

## 2015-04-02 DIAGNOSIS — R9389 Abnormal findings on diagnostic imaging of other specified body structures: Secondary | ICD-10-CM

## 2015-04-02 LAB — COMPREHENSIVE METABOLIC PANEL
ALBUMIN: 3.4 g/dL — AB (ref 3.5–5.0)
ALT: 29 U/L (ref 17–63)
ANION GAP: 4 — AB (ref 5–15)
AST: 41 U/L (ref 15–41)
Alkaline Phosphatase: 66 U/L (ref 38–126)
BUN: 17 mg/dL (ref 6–20)
CALCIUM: 8.9 mg/dL (ref 8.9–10.3)
CHLORIDE: 103 mmol/L (ref 101–111)
CO2: 27 mmol/L (ref 22–32)
Creatinine, Ser: 1.36 mg/dL — ABNORMAL HIGH (ref 0.61–1.24)
GFR calc non Af Amer: 52 mL/min — ABNORMAL LOW (ref >60)
GLUCOSE: 93 mg/dL (ref 65–99)
POTASSIUM: 4.3 mmol/L (ref 3.5–5.1)
SODIUM: 134 mmol/L — AB (ref 135–145)
Total Bilirubin: 0.3 mg/dL (ref 0.3–1.2)
Total Protein: 8.5 g/dL — ABNORMAL HIGH (ref 6.5–8.1)

## 2015-04-02 LAB — CBC WITH DIFFERENTIAL/PLATELET
Basophils Absolute: 0 10*3/uL (ref 0.0–0.1)
Basophils Relative: 0 %
EOS ABS: 0.1 10*3/uL (ref 0.0–0.7)
EOS PCT: 1 %
HCT: 35 % — ABNORMAL LOW (ref 39.0–52.0)
HEMOGLOBIN: 11.4 g/dL — AB (ref 13.0–17.0)
LYMPHS ABS: 1.2 10*3/uL (ref 0.7–4.0)
Lymphocytes Relative: 22 %
MCH: 28.4 pg (ref 26.0–34.0)
MCHC: 32.6 g/dL (ref 30.0–36.0)
MCV: 87.3 fL (ref 78.0–100.0)
MONO ABS: 0.9 10*3/uL (ref 0.1–1.0)
MONOS PCT: 17 %
NEUTROS PCT: 60 %
Neutro Abs: 3.3 10*3/uL (ref 1.7–7.7)
PLATELETS: 287 10*3/uL (ref 150–400)
RBC: 4.01 MIL/uL — ABNORMAL LOW (ref 4.22–5.81)
RDW: 16.4 % — AB (ref 11.5–15.5)
WBC: 5.6 10*3/uL (ref 4.0–10.5)

## 2015-04-02 LAB — PSA: PSA: 1.87 ng/mL (ref 0.00–4.00)

## 2015-04-02 MED ORDER — INFLUENZA VAC SPLIT QUAD 0.5 ML IM SUSY
0.5000 mL | PREFILLED_SYRINGE | Freq: Once | INTRAMUSCULAR | Status: AC
  Administered 2015-04-02: 0.5 mL via INTRAMUSCULAR

## 2015-04-02 MED ORDER — OXYCODONE HCL 5 MG PO TABS: 5.0000 mg | ORAL_TABLET | ORAL | Status: DC | PRN

## 2015-04-02 MED ORDER — INFLUENZA VAC SPLIT QUAD 0.5 ML IM SUSY
PREFILLED_SYRINGE | INTRAMUSCULAR | Status: AC
  Filled 2015-04-02: qty 0.5

## 2015-04-02 MED ORDER — DENOSUMAB 120 MG/1.7ML ~~LOC~~ SOLN
120.0000 mg | Freq: Once | SUBCUTANEOUS | Status: AC
  Administered 2015-04-02: 120 mg via SUBCUTANEOUS
  Filled 2015-04-02: qty 1.7

## 2015-04-02 NOTE — Progress Notes (Signed)
No PCP Per Patient Jeff [name] [id number]  Stage IV Adenocarcinoma of the prostate, Hormone Sensitive Disease Bilateral orchiectomy 01/2014 Taxotere/Xgeva started 02/2014 Bilateral nephrostomy tube placement 01/2014 Nephrostomy tube removal 07/23/2014  CURRENT THERAPY:Observation. INTERVAL HISTORY: Jeff [name] 69 y.o. male returns for follow-up of his hormone sensitive stage IV prostate cancer. He has completed Taxotere. He is doing remarkably well. He has had a bilateral orchiectomy.  [name] is here alone today. He continues to work. He reports no new problems, other than having a tooth pulled about two months ago. He has had no issues healing. He has been eating well with a good appetite. Denies any urinary or bowel problems. The last flu shot he received was while he was in the hospital last year.   He describes his appetite as good but notes he is quite active. He denies new pain.  MEDICAL HISTORY: Past Medical History  Diagnosis Date  . Acute renal failure ([place]) 01/26/2014  . Anemia of chronic disease 01/26/2014  . Bilateral hydronephrosis 01/26/2014  . History of cocaine abuse 01/26/2014  . Homelessness 01/31/2014  . Prostate cancer metastatic to multiple sites ([place]) 01/30/2014  . Alcohol abuse   . UTI (lower urinary tract infection)   . Sepsis ([place])     has Bone lesion; Acute renal failure ([place]); Bilateral hydronephrosis; Anemia of chronic disease; History of cocaine abuse; Prostate cancer metastatic to multiple sites [facility]); Homelessness; Health care maintenance; Sepsis ([place]); UTI (lower urinary tract infection); Malnutrition of moderate degree ([place]); Bacteremia; and Pyrexia on his problem list.      Prostate cancer metastatic to multiple sites [facility])   01/26/2014 Tumor Marker PSA > 5000   01/28/2014 Initial Biopsy Metastatic adenocarcinoma of prostate   02/05/2014 Surgery Bilateral orchiectomy by Dr. Junious Silk   02/26/2014 Tumor Marker PSA = 399.5    03/14/2014 Tumor Marker PSA= 89.51   03/14/2014 - 06/26/2014 Chemotherapy Docetaxel 75 mg/kg every 21 days   04/24/2014 Tumor Marker PSA- 19.89   07/24/2014 Tumor Marker PSA= 6.15     has No Known Allergies.  [name] does not currently have medications on file.  SURGICAL HISTORY: Past Surgical History  Procedure Laterality Date  . Orchiectomy Bilateral 02/05/2014    Procedure: BILATERAL ORCHIECTOMY;  Surgeon: Festus Aloe, MD;  Location: [facility];  Service: Urology;  Laterality: Bilateral;  . Portacath placement Right 03/12/14  . Percutaneous nephrostomy Bilateral     IR Dr. Junious Silk changed on 04/22/2014    SOCIAL HISTORY: Social History   Social History  . Marital Status: Single    Spouse Name: N/A  . Number of Children: 1  . Years of Education: 9th   Occupational History  . disabilty    Social History Main Topics  . Smoking status: Current Every Day Smoker -- 0.25 packs/day for 55 years    Types: Cigarettes  . Smokeless tobacco: Never Used  . Alcohol Use: No     Comment: none for at least 7 -8 years  . Drug Use: Yes    Special: Cocaine     Comment: last used Cocaine 01/25/14  . Sexual Activity: Yes   Other Topics Concern  . Not on file   Social History Narrative   Lives at [employer] 9th grade   1 son, lives in [place], [place]   Can read and write in native language             FAMILY HISTORY: Family  History  Problem Relation Age of Onset  . Cancer Brother 60    Review of Systems  Constitutional: Negative for fever, chills, weight loss and malaise/fatigue.  HENT: Negative for congestion, hearing loss, nosebleeds, sore throat and tinnitus.   Eyes: Negative for blurred vision, double vision, pain and discharge.  Respiratory: Negative for cough, hemoptysis, sputum production, shortness of breath and wheezing.   Cardiovascular: Negative for chest pain, palpitations, claudication, leg swelling and PND.  Gastrointestinal:  Negative for heartburn, nausea, vomiting, abdominal pain, diarrhea, constipation, blood in stool and melena.  Genitourinary: Negative for dysuria, urgency, frequency and hematuria.  Musculoskeletal: Positive for back pain. Negative for myalgias, joint pain and falls.  Skin: Negative for itching and rash.  Neurological: Negative for dizziness, tingling, tremors, sensory change, speech change, focal weakness, seizures, loss of consciousness, weakness and headaches.  Endo/Heme/Allergies: Does not bruise/bleed easily.  Psychiatric/Behavioral: Negative for depression, suicidal ideas, memory loss and substance abuse. The patient is not nervous/anxious and does not have insomnia.    14 point review of systems was performed and is negative except as detailed under history of present illness and above  PHYSICAL EXAMINATION  ECOG PERFORMANCE STATUS: 0 - Asymptomatic  Filed Vitals:   04/02/15 0933  BP: 113/77  Pulse: 72  Temp: 98 F (36.7 C)  Resp: 18    Physical Exam  Constitutional: He is oriented to person, place, and time and well-developed, well-nourished, and in no distress.  HENT:  Head: Normocephalic and atraumatic.  Nose: Nose normal.  Mouth/Throat: Oropharynx is clear and moist. No oropharyngeal exudate.  Poor dentition  Eyes: Conjunctivae and EOM are normal. Pupils are equal, round, and reactive to light. Right eye exhibits no discharge. Left eye exhibits no discharge. No scleral icterus.  Neck: Normal range of motion. Neck supple. No tracheal deviation present. No thyromegaly present.  Cardiovascular: Normal rate, regular rhythm and normal heart sounds.  Exam reveals no gallop and no friction rub.   No murmur heard. Pulmonary/Chest: Effort normal and breath sounds normal. He has no wheezes. He has no rales.  Abdominal: Soft. Bowel sounds are normal. He exhibits no distension and no mass. There is no tenderness. There is no rebound and no guarding.  Musculoskeletal: Normal range  of motion. He exhibits no edema.  Lymphadenopathy:    He has no cervical adenopathy.  Neurological: He is alert and oriented to person, place, and time. He has normal reflexes. No cranial nerve deficit. Gait normal. Coordination normal.  Skin: Skin is warm and dry. No rash noted.  Psychiatric: Mood, memory, affect and judgment normal.  Nursing note and vitals reviewed.   LABORATORY DATA:  I have reviewed the labs below.  CBC    Component Value Date/Time   WBC 5.6 04/02/2015 0912   RBC 4.01* 04/02/2015 0912   HGB 11.4* 04/02/2015 0912   HCT 35.0* 04/02/2015 0912   PLT 287 04/02/2015 0912   MCV 87.3 04/02/2015 0912   MCH 28.4 04/02/2015 0912   MCHC 32.6 04/02/2015 0912   RDW 16.4* 04/02/2015 0912   LYMPHSABS 1.2 04/02/2015 0912   MONOABS 0.9 04/02/2015 0912   EOSABS 0.1 04/02/2015 0912   BASOSABS 0.0 04/02/2015 0912   CMP     Component Value Date/Time   NA 134* 04/02/2015 0912   K 4.3 04/02/2015 0912   CL 103 04/02/2015 0912   CO2 27 04/02/2015 0912   GLUCOSE 93 04/02/2015 0912   BUN 17 04/02/2015 0912   CREATININE 1.36* 04/02/2015 0912  CREATININE 1.39* 02/06/2014 1012   CALCIUM 8.9 04/02/2015 0912   PROT 8.5* 04/02/2015 0912   ALBUMIN 3.4* 04/02/2015 0912   AST 41 04/02/2015 0912   ALT 29 04/02/2015 0912   ALKPHOS 66 04/02/2015 0912   BILITOT 0.3 04/02/2015 0912   GFRNONAA 52* 04/02/2015 0912   GFRAA >60 04/02/2015 0912     ASSESSMENT and THERAPY PLAN:   Stage IV prostate cancer, hormone sensitive disease. Bilateral nephrostomy tubes Bilateral orchiectomy History of polysubstance abuse  Anemia, multifactorial    69 year old male with stage IV adenocarcinoma of the prostate, hormone sensitive disease.He has completed Taxotere with an excellent response. His nephrostomy tubes are out.  He had a bilateral orchiectomy. He continues on XGEVA monthly. He was again advised to take calcium and vitamin D daily. Patient states he is very compliant with his  medications. He is only asking for a pain medication refill once every 3 months. He says he has several days where he does not need any pain medication.  I discussed with him ongoing surveillance after completion of his chemotherapy. I will see him again in 3 months with repeat labs including a PSA. He will continue with monthly XGEVA.  He received a flu shot today.  [name] will return in 3 months for routine follow-up.  All questions were answered. The patient knows to call the clinic with any problems, questions or concerns. We can certainly see the patient much sooner if necessary.  This document serves as a record of services personally performed by Ancil Linsey, MD. It was created on her behalf by Arlyce Harman, a trained medical scribe. The creation of this record is based on the scribe's personal observations and the provider's statements to them. This document has been checked and approved by the attending provider.  I have reviewed the above documentation for accuracy and completeness, and I agree with the above.  Molli Hazard, MD  04/02/2015

## 2015-04-02 NOTE — Progress Notes (Signed)
See exam notes 

## 2015-04-02 NOTE — Progress Notes (Signed)
..  Jeff [name] presents today for injection per the provider's orders.  xgeva administration without incident; see MAR for injection details.  Patient tolerated procedure well and without incident.  No questions or complaints noted at this time.[name] Kitchen [name] KitchenAdriana [name] presents today for injection per the provider's orders.  Flu vaccine administration without incident; see MAR for injection details.  Patient tolerated procedure well and without incident.  No questions or complaints noted at this time.

## 2015-04-02 NOTE — Progress Notes (Signed)
Labs drawn

## 2015-04-02 NOTE — Patient Instructions (Addendum)
[place] at [facility] Discharge Instructions  RECOMMENDATIONS MADE BY THE CONSULTANT AND ANY TEST RESULTS WILL BE SENT TO YOUR REFERRING PHYSICIAN.   Exam completed by Dr Whitney Muse today Jayme Cloud today Pain medication refill given today Flu shot todaY Calcium was 8.9 X-geva every 28 days Port flush every 8 weeks Return to see the doctor in 3 months Please call the clinic if you have any questions or concerns    Thank you for choosing [place] at [facility] to provide your oncology and hematology care.  To afford each patient quality time with our provider, please arrive at least 15 minutes before your scheduled appointment time.    You need to re-schedule your appointment should you arrive 10 or more minutes late.  We strive to give you quality time with our providers, and arriving late affects you and other patients whose appointments are after yours.  Also, if you no show three or more times for appointments you may be dismissed from the clinic at the providers discretion.     Again, thank you for choosing [facility].  Our hope is that these requests will decrease the amount of time that you wait before being seen by our physicians.       _____________________________________________________________  Should you have questions after your visit to [facility], please contact our office at [phone] between the hours of 8:30 a.m. and 4:30 p.m.  Voicemails left after 4:30 p.m. will not be returned until the following business day.  For prescription refill requests, have your pharmacy contact our office.

## 2015-04-30 ENCOUNTER — Encounter (HOSPITAL_COMMUNITY): Payer: Medicare Other | Attending: Hematology & Oncology

## 2015-04-30 ENCOUNTER — Encounter (HOSPITAL_BASED_OUTPATIENT_CLINIC_OR_DEPARTMENT_OTHER): Payer: Medicare Other

## 2015-04-30 ENCOUNTER — Encounter (HOSPITAL_COMMUNITY): Payer: Medicare Other

## 2015-04-30 VITALS — BP 122/73 | HR 93 | Temp 97.3°F | Resp 20

## 2015-04-30 DIAGNOSIS — M899 Disorder of bone, unspecified: Secondary | ICD-10-CM | POA: Insufficient documentation

## 2015-04-30 DIAGNOSIS — C61 Malignant neoplasm of prostate: Secondary | ICD-10-CM | POA: Insufficient documentation

## 2015-04-30 LAB — COMPREHENSIVE METABOLIC PANEL
ALBUMIN: 3.6 g/dL (ref 3.5–5.0)
ALT: 26 U/L (ref 17–63)
ANION GAP: 9 (ref 5–15)
AST: 33 U/L (ref 15–41)
Alkaline Phosphatase: 77 U/L (ref 38–126)
BILIRUBIN TOTAL: 0.3 mg/dL (ref 0.3–1.2)
BUN: 25 mg/dL — AB (ref 6–20)
CHLORIDE: 105 mmol/L (ref 101–111)
CO2: 22 mmol/L (ref 22–32)
Calcium: 9.2 mg/dL (ref 8.9–10.3)
Creatinine, Ser: 1.81 mg/dL — ABNORMAL HIGH (ref 0.61–1.24)
GFR calc Af Amer: 43 mL/min — ABNORMAL LOW (ref >60)
GFR, EST NON AFRICAN AMERICAN: 37 mL/min — AB (ref >60)
Glucose, Bld: 121 mg/dL — ABNORMAL HIGH (ref 65–99)
POTASSIUM: 4.5 mmol/L (ref 3.5–5.1)
Sodium: 136 mmol/L (ref 135–145)
TOTAL PROTEIN: 8.6 g/dL — AB (ref 6.5–8.1)

## 2015-04-30 MED ORDER — HEPARIN SOD (PORK) LOCK FLUSH 100 UNIT/ML IV SOLN
500.0000 [IU] | Freq: Once | INTRAVENOUS | Status: AC
  Administered 2015-04-30: 500 [IU] via INTRAVENOUS

## 2015-04-30 MED ORDER — DENOSUMAB 120 MG/1.7ML ~~LOC~~ SOLN
120.0000 mg | Freq: Once | SUBCUTANEOUS | Status: AC
  Administered 2015-04-30: 120 mg via SUBCUTANEOUS
  Filled 2015-04-30: qty 1.7

## 2015-04-30 MED ORDER — SODIUM CHLORIDE 0.9 % IJ SOLN
10.0000 mL | Freq: Once | INTRAMUSCULAR | Status: AC
  Administered 2015-04-30: 10 mL via INTRAVENOUS

## 2015-04-30 MED ORDER — OXYCODONE HCL 5 MG PO TABS: 5.0000 mg | ORAL_TABLET | ORAL | Status: DC | PRN

## 2015-04-30 MED ORDER — HEPARIN SOD (PORK) LOCK FLUSH 100 UNIT/ML IV SOLN
INTRAVENOUS | Status: AC
  Filled 2015-04-30: qty 5

## 2015-04-30 NOTE — Progress Notes (Signed)
Jeff [name] presents today for injection per MD orders. Xgeva 120 mg administered SQ in left Abdomen. Administration without incident. Patient tolerated well.  

## 2015-04-30 NOTE — Patient Instructions (Signed)
[place] at [facility] Discharge Instructions  RECOMMENDATIONS MADE BY THE CONSULTANT AND ANY TEST RESULTS WILL BE SENT TO YOUR REFERRING PHYSICIAN.  Lab work today. Xgeva injection given as ordered. Return as scheduled.  Thank you for choosing [place] at [facility] to provide your oncology and hematology care.  To afford each patient quality time with our provider, please arrive at least 15 minutes before your scheduled appointment time.    You need to re-schedule your appointment should you arrive 10 or more minutes late.  We strive to give you quality time with our providers, and arriving late affects you and other patients whose appointments are after yours.  Also, if you no show three or more times for appointments you may be dismissed from the clinic at the providers discretion.     Again, thank you for choosing [facility].  Our hope is that these requests will decrease the amount of time that you wait before being seen by our physicians.       _____________________________________________________________  Should you have questions after your visit to [facility], please contact our office at [phone] between the hours of 8:30 a.m. and 4:30 p.m.  Voicemails left after 4:30 p.m. will not be returned until the following business day.  For prescription refill requests, have your pharmacy contact our office.

## 2015-04-30 NOTE — Progress Notes (Signed)
..  Jeff [name] presented for Portacath access and flush.    Portacath located rt chest wall accessed with  H 20 needle.  Good blood return present. Portacath flushed with 32ml NS and 500U/87ml Heparin and needle removed intact.  Procedure tolerated well and without incident.

## 2015-05-28 ENCOUNTER — Encounter (HOSPITAL_COMMUNITY): Payer: Self-pay

## 2015-05-28 ENCOUNTER — Encounter (HOSPITAL_COMMUNITY): Payer: Medicare Other | Attending: Hematology & Oncology

## 2015-05-28 ENCOUNTER — Encounter (HOSPITAL_COMMUNITY): Payer: Medicare Other

## 2015-05-28 VITALS — BP 140/63 | HR 80 | Temp 97.5°F | Resp 18

## 2015-05-28 DIAGNOSIS — M899 Disorder of bone, unspecified: Secondary | ICD-10-CM | POA: Insufficient documentation

## 2015-05-28 DIAGNOSIS — C61 Malignant neoplasm of prostate: Secondary | ICD-10-CM

## 2015-05-28 DIAGNOSIS — C7951 Secondary malignant neoplasm of bone: Secondary | ICD-10-CM

## 2015-05-28 LAB — COMPREHENSIVE METABOLIC PANEL
ALBUMIN: 3.6 g/dL (ref 3.5–5.0)
ALK PHOS: 64 U/L (ref 38–126)
ALT: 21 U/L (ref 17–63)
AST: 29 U/L (ref 15–41)
Anion gap: 10 (ref 5–15)
BUN: 17 mg/dL (ref 6–20)
CALCIUM: 8.9 mg/dL (ref 8.9–10.3)
CO2: 22 mmol/L (ref 22–32)
CREATININE: 1.65 mg/dL — AB (ref 0.61–1.24)
Chloride: 105 mmol/L (ref 101–111)
GFR calc non Af Amer: 41 mL/min — ABNORMAL LOW (ref >60)
GFR, EST AFRICAN AMERICAN: 48 mL/min — AB (ref >60)
GLUCOSE: 96 mg/dL (ref 65–99)
Potassium: 4.1 mmol/L (ref 3.5–5.1)
SODIUM: 137 mmol/L (ref 135–145)
Total Bilirubin: 0.3 mg/dL (ref 0.3–1.2)
Total Protein: 8.9 g/dL — ABNORMAL HIGH (ref 6.5–8.1)

## 2015-05-28 MED ORDER — OXYCODONE HCL 5 MG PO TABS: 5.0000 mg | ORAL_TABLET | ORAL | Status: DC | PRN

## 2015-05-28 MED ORDER — DENOSUMAB 120 MG/1.7ML ~~LOC~~ SOLN
120.0000 mg | Freq: Once | SUBCUTANEOUS | Status: AC
  Administered 2015-05-28: 120 mg via SUBCUTANEOUS
  Filled 2015-05-28: qty 1.7

## 2015-05-28 NOTE — Patient Instructions (Signed)
[place] at [facility] Discharge Instructions  RECOMMENDATIONS MADE BY THE CONSULTANT AND ANY TEST RESULTS WILL BE SENT TO YOUR REFERRING PHYSICIAN.  You got your Xgeva injection today. Follow up at next appt.  Thank you for choosing [place] at [facility] to provide your oncology and hematology care.  To afford each patient quality time with our provider, please arrive at least 15 minutes before your scheduled appointment time.   Beginning January 23rd 2017 lab work for the [facility] will be done in the  Main lab at [facility] on 1st floor. If you have a lab appointment with the [facility] please come in thru the  Main Entrance and check in at the main information desk  You need to re-schedule your appointment should you arrive 10 or more minutes late.  We strive to give you quality time with our providers, and arriving late affects you and other patients whose appointments are after yours.  Also, if you no show three or more times for appointments you may be dismissed from the clinic at the providers discretion.     Again, thank you for choosing [facility].  Our hope is that these requests will decrease the amount of time that you wait before being seen by our physicians.       _____________________________________________________________  Should you have questions after your visit to [facility], please contact our office at [phone] between the hours of 8:30 a.m. and 4:30 p.m.  Voicemails left after 4:30 p.m. will not be returned until the following business day.  For prescription refill requests, have your pharmacy contact our office.

## 2015-05-28 NOTE — Progress Notes (Signed)
Jeff [name] presents today for injection per MD orders. Xgeva 120 mg administered SQ in right Abdomen. Administration without incident. Patient tolerated well.

## 2015-06-24 NOTE — Progress Notes (Signed)
No PCP Per Patient No address on file  Prostate cancer metastatic to multiple sites [facility]) - Plan: NM Bone Scan Whole Body, CT Abdomen Pelvis W Contrast, CT Chest W Contrast, oxyCODONE (OXY IR/ROXICODONE) 5 MG immediate release tablet  Anemia of chronic disease - Plan: Vitamin B12, Folate, Iron and TIBC, Ferritin, Occult blood card to lab, stool, Occult blood card to lab, stool, Occult blood card to lab, stool  Bone lesion - Plan: oxyCODONE (OXY IR/ROXICODONE) 5 MG immediate release tablet  CURRENT THERAPY: Observation.  S/P 6 cycles of docetaxel from 03/14/2014 through 06/26/2014.  Continued on monthly Xgeva.  INTERVAL HISTORY: Jeff [name] 70 y.o. male returns for followup of stage IV adenocarcinoma of the prostate, hormone sensitive disease, with osseous involvement; S/P Docetaxel x 6 cycles 03/14/2014- 06/26/2014 with an excellent response. His nephrostomy tubes are out.    Prostate cancer metastatic to multiple sites [facility])   01/26/2014 Tumor Marker PSA > 5000   01/28/2014 Initial Biopsy Metastatic adenocarcinoma of prostate   02/05/2014 Surgery Bilateral orchiectomy by Dr. Junious Silk   02/26/2014 Tumor Marker PSA = 399.5   03/14/2014 Tumor Marker PSA= 89.51   03/14/2014 - 06/26/2014 Chemotherapy Docetaxel 75 mg/kg every 21 days   04/24/2014 Tumor Marker PSA- 19.89   07/23/2014 Procedure Nephrostomy tube removed by IR, Dr. Geroge Baseman   07/24/2014 Tumor Marker PSA= 6.15   10/16/2014 Tumor Marker PSA: 3.54    01/08/2015 Tumor Marker PSA: 2.13    01/17/2015 Imaging CT CAP-  Massive pelvic and retroperitoneal lymphadenopathy noted on the prior study has nearly completely resolved, now with only a small amount of residual amorphous soft tissue predominantly around the the infrarenal abdominal aorta.   01/17/2015 Imaging Bone scan- Widespread osseous metastatic disease with multiple foci of increased activity throughout the skeleton, corresponding with the findings on the prior CT from  October, 2015.   04/11/2015 Tumor Marker PSA: 1.87     I personally reviewed and went over laboratory results with the patient.  The results are noted within this dictation.Labs are being updated today. Mild anemia persists and is appreciated and therefore peripheral workup will take place today and he will be sent home with 3 stool cards.  I personally reviewed and went over radiographic studies with the patient.  The results are noted within this dictation.  We'll repeat imaging in March for restaging purposes.  He continues to work 6 days per week. 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  He denies any complaints. He notes his appetite is strong. No documented weight loss. He denies any abdominal pain. He denies any new enlarged lymph nodes. He remains very active as mentioned above still working 5-6 days per week. He notes intermittent discomfort is well controlled with his pain medication. His pain medication last approximately 2-3 months.  He denies any complaints.   Past Medical History  Diagnosis Date  . Acute renal failure ([place]) 01/26/2014  . Anemia of chronic disease 01/26/2014  . Bilateral hydronephrosis 01/26/2014  . History of cocaine abuse 01/26/2014  . Homelessness 01/31/2014  . Prostate cancer metastatic to multiple sites ([place]) 01/30/2014  . Alcohol abuse   . UTI (lower urinary tract infection)   . Sepsis ([place])     has Bone lesion; Acute renal failure ([place]); Bilateral hydronephrosis; Anemia of chronic disease; History of cocaine abuse; Prostate cancer metastatic to multiple sites [facility]); Homelessness; Health care maintenance;  Sepsis ([place]); UTI (lower urinary tract infection); Malnutrition of moderate degree ([place]); Bacteremia; and Pyrexia on his problem list.     has No Known Allergies.  Current Outpatient Prescriptions on File Prior to Visit  Medication Sig Dispense Refill  . acetaminophen  (TYLENOL) 325 MG tablet Take 2 tablets (650 mg total) by mouth every 6 (six) hours as needed for mild pain (or Fever >/= 101). (Patient not taking: Reported on 01/08/2015)    . calcium-vitamin D (OSCAL WITH D) 500-200 MG-UNIT per tablet Take 2 tablets by mouth daily with breakfast. (Patient not taking: Reported on 06/25/2015) 60 tablet 11  . HYDROcodone-acetaminophen (NORCO/VICODIN) 5-325 MG tablet Reported on 06/25/2015  0  . lidocaine-prilocaine (EMLA) cream Apply a quarter size amount to port site 1 hour prior to chemo. Do not rub in. Cover with plastic wrap. (Patient not taking: Reported on 06/25/2015) 30 g 3  . metoCLOPramide (REGLAN) 5 MG tablet The day after chemo take 1 tab four times a day x 2 days. Then may take 1 tab four times a day if needed for nausea/vomiting. (Patient not taking: Reported on 01/08/2015) 60 tablet 2  . prochlorperazine (COMPAZINE) 10 MG tablet The day after chemo take 1 tab four times a day x 2 days. Then may take 1 tab four times a day if needed for nausea/vomiting. (Patient not taking: Reported on 01/08/2015) 60 tablet 2   Current Facility-Administered Medications on File Prior to Visit  Medication Dose Route Frequency Provider Last Rate Last Dose  . sodium chloride 0.9 % injection 10 mL  10 mL Intracatheter PRN Farrel Gobble, MD        Past Surgical History  Procedure Laterality Date  . Orchiectomy Bilateral 02/05/2014    Procedure: BILATERAL ORCHIECTOMY;  Surgeon: Festus Aloe, MD;  Location: [facility];  Service: Urology;  Laterality: Bilateral;  . Portacath placement Right 03/12/14  . Percutaneous nephrostomy Bilateral     IR Dr. Junious Silk changed on 04/22/2014    Denies any headaches, dizziness, double vision, fevers, chills, night sweats, nausea, vomiting, diarrhea, constipation, chest pain, heart palpitations, shortness of breath, blood in stool, black tarry stool, urinary pain, urinary burning, urinary frequency, hematuria.   PHYSICAL EXAMINATION  ECOG  PERFORMANCE STATUS: 0 - Asymptomatic  Filed Vitals:   06/25/15 1310  BP: 93/60  Pulse: 101  Temp: 97.9 F (36.6 C)  Resp: 18    GENERAL:alert, no distress, well nourished, well developed, comfortable, cooperative, smiling and unaccompanied SKIN: skin color, texture, turgor are normal, no rashes or significant lesions HEAD: Normocephalic, No masses, lesions, tenderness or abnormalities EYES: normal, EOMI, Conjunctiva are pink and non-injected EARS: External ears normal OROPHARYNX:lips, buccal mucosa, and tongue normal and mucous membranes are moist  NECK: supple, trachea midline LYMPH:  no palpable lymphadenopathy, no hepatosplenomegaly BREAST:not examined LUNGS: clear to auscultation and percussion HEART: regular rate & rhythm, no murmurs, no gallops, S1 normal and S2 normal ABDOMEN:abdomen soft, non-tender, normal bowel sounds and no masses or organomegaly BACK: Back symmetric, no curvature., No CVA tenderness EXTREMITIES:less then 2 second capillary refill, no joint deformities, effusion, or inflammation, no edema, no skin discoloration, no cyanosis  NEURO: alert & oriented x 3 with fluent speech, no focal motor/sensory deficits, gait normal   LABORATORY DATA: CBC    Component Value Date/Time   WBC 5.6 04/02/2015 0912   RBC 4.01* 04/02/2015 0912   HGB 11.4* 04/02/2015 0912   HCT 35.0* 04/02/2015 0912   PLT 287 04/02/2015 0912   MCV 87.3 04/02/2015  0912   MCH 28.4 04/02/2015 0912   MCHC 32.6 04/02/2015 0912   RDW 16.4* 04/02/2015 0912   LYMPHSABS 1.2 04/02/2015 0912   MONOABS 0.9 04/02/2015 0912   EOSABS 0.1 04/02/2015 0912   BASOSABS 0.0 04/02/2015 0912      Chemistry      Component Value Date/Time   NA 137 05/28/2015 1118   K 4.1 05/28/2015 1118   CL 105 05/28/2015 1118   CO2 22 05/28/2015 1118   BUN 17 05/28/2015 1118   CREATININE 1.65* 05/28/2015 1118   CREATININE 1.39* 02/06/2014 1012      Component Value Date/Time   CALCIUM 8.9 05/28/2015 1118    ALKPHOS 64 05/28/2015 1118   AST 29 05/28/2015 1118   ALT 21 05/28/2015 1118   BILITOT 0.3 05/28/2015 1118     Lab Results  Component Value Date   PSA 1.87 04/02/2015   PSA 2.13 01/08/2015   PSA 3.54 10/16/2014     PENDING LABS:   RADIOGRAPHIC STUDIES:  No results found.   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 His nephrostomy tubes are out.  Oncology history is updated.  Labs today: CBC diff, CMET, PSA.  Given his persistent anemia, I have added an anemia panel to his labs today.  Also, he will go home with stool cards x 3 to verify no occult GI blood loss.  Xgeva due today and every 4 weeks.  Labs in 3 months: CBC diff, CMET, PSA.  NCCN guidelines recommends CT CAP and bone scan as often as every 6-12 months.  His last imaging was in September 2016.  I have placed orders for CT CAP with contrast next month in addition to a NM bone scan for surveillance/restaging purposes.  I'll refill his oxycodone pain medication.  Return in 3 months for follow-up, sooner if necessary based upon PSA and imaging results.   THERAPY PLAN:  Restaging scans upcoming and return as scheduled, sooner based upon imaging/PSA results.  All questions were answered. The patient knows to call the clinic with any problems, questions or concerns. We can certainly see the patient much sooner if necessary.  Patient and plan discussed with Dr. Ancil Linsey and she is in agreement with the aforementioned.   This note is electronically signed by: Robynn Pane, PA-C 06/25/2015 1:50 PM

## 2015-06-24 NOTE — Assessment & Plan Note (Addendum)
Stage IV adenocarcinoma of the prostate, hormone sensitive disease, with osseous involvement; S/P Docetaxel x 6 cycles 03/14/2014- 06/26/2014 with an excellent response. His nephrostomy tubes are out.  Oncology history is updated.  Labs today: CBC diff, CMET, PSA.  Given his persistent anemia, I have added an anemia panel to his labs today.  Also, he will go home with stool cards x 3 to verify no occult GI blood loss.  Xgeva due today and every 4 weeks.  Labs in 3 months: CBC diff, CMET, PSA.  NCCN guidelines recommends CT CAP and bone scan as often as every 6-12 months.  His last imaging was in September 2016.  I have placed orders for CT CAP with contrast next month in addition to a NM bone scan for surveillance/restaging purposes.  I'll refill his oxycodone pain medication.  Return in 3 months for follow-up, sooner if necessary based upon PSA and imaging results.

## 2015-06-25 ENCOUNTER — Encounter (HOSPITAL_BASED_OUTPATIENT_CLINIC_OR_DEPARTMENT_OTHER): Payer: Medicare Other | Admitting: Oncology

## 2015-06-25 ENCOUNTER — Encounter (HOSPITAL_COMMUNITY): Payer: Medicare Other | Attending: Hematology & Oncology

## 2015-06-25 ENCOUNTER — Encounter (HOSPITAL_COMMUNITY): Payer: Self-pay | Admitting: Oncology

## 2015-06-25 ENCOUNTER — Encounter (HOSPITAL_COMMUNITY): Payer: Medicare Other

## 2015-06-25 VITALS — BP 93/60 | HR 101 | Temp 97.9°F | Resp 18 | Wt 170.0 lb

## 2015-06-25 DIAGNOSIS — M899 Disorder of bone, unspecified: Secondary | ICD-10-CM | POA: Diagnosis not present

## 2015-06-25 DIAGNOSIS — D638 Anemia in other chronic diseases classified elsewhere: Secondary | ICD-10-CM | POA: Diagnosis not present

## 2015-06-25 DIAGNOSIS — Z9889 Other specified postprocedural states: Secondary | ICD-10-CM | POA: Diagnosis not present

## 2015-06-25 DIAGNOSIS — C7951 Secondary malignant neoplasm of bone: Secondary | ICD-10-CM | POA: Insufficient documentation

## 2015-06-25 DIAGNOSIS — C61 Malignant neoplasm of prostate: Secondary | ICD-10-CM

## 2015-06-25 DIAGNOSIS — Z79899 Other long term (current) drug therapy: Secondary | ICD-10-CM | POA: Diagnosis not present

## 2015-06-25 DIAGNOSIS — Z8546 Personal history of malignant neoplasm of prostate: Secondary | ICD-10-CM | POA: Diagnosis not present

## 2015-06-25 DIAGNOSIS — N1330 Unspecified hydronephrosis: Secondary | ICD-10-CM | POA: Diagnosis not present

## 2015-06-25 DIAGNOSIS — N179 Acute kidney failure, unspecified: Secondary | ICD-10-CM | POA: Diagnosis not present

## 2015-06-25 LAB — CBC WITH DIFFERENTIAL/PLATELET
BASOS ABS: 0 10*3/uL (ref 0.0–0.1)
Basophils Relative: 0 %
EOS PCT: 1 %
Eosinophils Absolute: 0.1 10*3/uL (ref 0.0–0.7)
HCT: 32.2 % — ABNORMAL LOW (ref 39.0–52.0)
Hemoglobin: 10.6 g/dL — ABNORMAL LOW (ref 13.0–17.0)
Lymphocytes Relative: 32 %
Lymphs Abs: 1.7 10*3/uL (ref 0.7–4.0)
MCH: 29.4 pg (ref 26.0–34.0)
MCHC: 32.9 g/dL (ref 30.0–36.0)
MCV: 89.2 fL (ref 78.0–100.0)
MONO ABS: 0.5 10*3/uL (ref 0.1–1.0)
Monocytes Relative: 10 %
Neutro Abs: 3 10*3/uL (ref 1.7–7.7)
Neutrophils Relative %: 57 %
PLATELETS: 255 10*3/uL (ref 150–400)
RBC: 3.61 MIL/uL — ABNORMAL LOW (ref 4.22–5.81)
RDW: 15.6 % — AB (ref 11.5–15.5)
WBC: 5.3 10*3/uL (ref 4.0–10.5)

## 2015-06-25 LAB — COMPREHENSIVE METABOLIC PANEL
ALT: 26 U/L (ref 17–63)
ANION GAP: 7 (ref 5–15)
AST: 35 U/L (ref 15–41)
Albumin: 3.4 g/dL — ABNORMAL LOW (ref 3.5–5.0)
Alkaline Phosphatase: 59 U/L (ref 38–126)
BUN: 22 mg/dL — AB (ref 6–20)
CHLORIDE: 104 mmol/L (ref 101–111)
CO2: 24 mmol/L (ref 22–32)
Calcium: 8.6 mg/dL — ABNORMAL LOW (ref 8.9–10.3)
Creatinine, Ser: 1.7 mg/dL — ABNORMAL HIGH (ref 0.61–1.24)
GFR calc non Af Amer: 40 mL/min — ABNORMAL LOW (ref >60)
GFR, EST AFRICAN AMERICAN: 46 mL/min — AB (ref >60)
Glucose, Bld: 108 mg/dL — ABNORMAL HIGH (ref 65–99)
POTASSIUM: 4.2 mmol/L (ref 3.5–5.1)
Sodium: 135 mmol/L (ref 135–145)
Total Bilirubin: 0.3 mg/dL (ref 0.3–1.2)
Total Protein: 8 g/dL (ref 6.5–8.1)

## 2015-06-25 MED ORDER — SODIUM CHLORIDE 0.9% FLUSH
20.0000 mL | INTRAVENOUS | Status: DC | PRN
  Administered 2015-06-25: 20 mL via INTRAVENOUS
  Filled 2015-06-25: qty 20

## 2015-06-25 MED ORDER — DENOSUMAB 120 MG/1.7ML ~~LOC~~ SOLN
120.0000 mg | Freq: Once | SUBCUTANEOUS | Status: AC
  Administered 2015-06-25: 120 mg via SUBCUTANEOUS
  Filled 2015-06-25: qty 1.7

## 2015-06-25 MED ORDER — OXYCODONE HCL 5 MG PO TABS: 5.0000 mg | ORAL_TABLET | ORAL | Status: DC | PRN

## 2015-06-25 MED ORDER — HEPARIN SOD (PORK) LOCK FLUSH 100 UNIT/ML IV SOLN
500.0000 [IU] | Freq: Once | INTRAVENOUS | Status: AC
  Administered 2015-06-25: 500 [IU] via INTRAVENOUS

## 2015-06-25 MED ORDER — HEPARIN SOD (PORK) LOCK FLUSH 100 UNIT/ML IV SOLN
INTRAVENOUS | Status: AC
  Filled 2015-06-25: qty 5

## 2015-06-25 NOTE — Progress Notes (Signed)
Jeff [name] presented for Portacath access and flush. Portacath located right chest wall accessed with  H 20 needle. Good blood return present.  Specimen drawn for labs. Portacath flushed with 17ml NS and 500U/30ml Heparin and needle removed intact. Procedure without incident. Patient tolerated procedure well.  Jeff [name] presents today for injection per MD orders. Xgeva administered SQ in right Abdomen. Administration without incident. Patient tolerated well.

## 2015-06-25 NOTE — Patient Instructions (Addendum)
[place] at [facility] Discharge Instructions  RECOMMENDATIONS MADE BY THE CONSULTANT AND ANY TEST RESULTS WILL BE SENT TO YOUR REFERRING PHYSICIAN.  Exam and discussion today with Kirby Crigler, PA. Port flush with lab work today. Xgeva today. Oxycodone refill given. CT scan and bone scan in March. Office visit in 3 months with Dr. Whitney Muse. Port flush with lab work in 3 months.   Thank you for choosing [place] at [facility] to provide your oncology and hematology care.  To afford each patient quality time with our provider, please arrive at least 15 minutes before your scheduled appointment time.   Beginning January 23rd 2017 lab work for the [facility] will be done in the  Main lab at [facility] on 1st floor. If you have a lab appointment with the [place] please come in thru the  Main Entrance and check in at the main information desk  You need to re-schedule your appointment should you arrive 10 or more minutes late.  We strive to give you quality time with our providers, and arriving late affects you and other patients whose appointments are after yours.  Also, if you no show three or more times for appointments you may be dismissed from the clinic at the providers discretion.     Again, thank you for choosing [facility].  Our hope is that these requests will decrease the amount of time that you wait before being seen by our physicians.       _____________________________________________________________  Should you have questions after your visit to [facility], please contact our office at [phone] between the hours of 8:30 a.m. and 4:30 p.m.  Voicemails left after 4:30 p.m. will not be returned until the following business day.  For prescription refill requests, have your pharmacy contact our office.

## 2015-06-25 NOTE — Progress Notes (Signed)
Please see other encounter for documentation.   

## 2015-06-25 NOTE — Patient Instructions (Signed)
[place] at [facility] Discharge Instructions  RECOMMENDATIONS MADE BY THE CONSULTANT AND ANY TEST RESULTS WILL BE SENT TO YOUR REFERRING PHYSICIAN.  Port flush with labs today.   Xgeva today. Please see MD appointment AVS for more information.    Thank you for choosing [place] at [facility] to provide your oncology and hematology care.  To afford each patient quality time with our provider, please arrive at least 15 minutes before your scheduled appointment time.   Beginning January 23rd 2017 lab work for the [facility] will be done in the  Main lab at [facility] on 1st floor. If you have a lab appointment with the [place] please come in thru the  Main Entrance and check in at the main information desk  You need to re-schedule your appointment should you arrive 10 or more minutes late.  We strive to give you quality time with our providers, and arriving late affects you and other patients whose appointments are after yours.  Also, if you no show three or more times for appointments you may be dismissed from the clinic at the providers discretion.     Again, thank you for choosing [facility].  Our hope is that these requests will decrease the amount of time that you wait before being seen by our physicians.       _____________________________________________________________  Should you have questions after your visit to [facility], please contact our office at [phone] between the hours of 8:30 a.m. and 4:30 p.m.  Voicemails left after 4:30 p.m. will not be returned until the following business day.  For prescription refill requests, have your pharmacy contact our office.

## 2015-06-26 ENCOUNTER — Other Ambulatory Visit (HOSPITAL_COMMUNITY): Payer: Self-pay | Admitting: Oncology

## 2015-06-26 DIAGNOSIS — E538 Deficiency of other specified B group vitamins: Secondary | ICD-10-CM

## 2015-06-26 LAB — IRON AND TIBC
Iron: 33 ug/dL — ABNORMAL LOW (ref 45–182)
SATURATION RATIOS: 16 % — AB (ref 17.9–39.5)
TIBC: 202 ug/dL — AB (ref 250–450)
UIBC: 169 ug/dL

## 2015-06-26 LAB — PSA: PSA: 1.54 ng/mL (ref 0.00–4.00)

## 2015-06-26 LAB — FOLATE: Folate: 15.5 ng/mL (ref >5.9)

## 2015-06-26 LAB — FERRITIN: Ferritin: 212 ng/mL (ref 24–336)

## 2015-06-26 LAB — VITAMIN B12: VITAMIN B 12: 204 pg/mL (ref 180–914)

## 2015-07-02 ENCOUNTER — Other Ambulatory Visit (HOSPITAL_COMMUNITY): Payer: Medicare Other

## 2015-07-02 ENCOUNTER — Ambulatory Visit (HOSPITAL_COMMUNITY): Payer: Medicare Other

## 2015-07-08 ENCOUNTER — Ambulatory Visit (HOSPITAL_COMMUNITY): Payer: Medicare Other

## 2015-07-16 ENCOUNTER — Ambulatory Visit (HOSPITAL_COMMUNITY): Payer: Medicare Other

## 2015-07-21 ENCOUNTER — Ambulatory Visit (HOSPITAL_COMMUNITY)
Admission: RE | Admit: 2015-07-21 | Discharge: 2015-07-21 | Disposition: A | Discharge status: Home / Self Care | Payer: Medicare Other | Source: Ambulatory Visit | Attending: Oncology | Admitting: Oncology

## 2015-07-21 ENCOUNTER — Encounter (HOSPITAL_COMMUNITY)
Admission: RE | Admit: 2015-07-21 | Discharge: 2015-07-21 | Disposition: A | Discharge status: Home / Self Care | Payer: Medicare Other | Source: Ambulatory Visit | Attending: Oncology | Admitting: Oncology

## 2015-07-21 ENCOUNTER — Encounter (HOSPITAL_COMMUNITY): Payer: Self-pay

## 2015-07-21 DIAGNOSIS — C61 Malignant neoplasm of prostate: Secondary | ICD-10-CM | POA: Diagnosis not present

## 2015-07-21 DIAGNOSIS — C7951 Secondary malignant neoplasm of bone: Secondary | ICD-10-CM | POA: Insufficient documentation

## 2015-07-21 MED ORDER — TECHNETIUM TC 99M MEDRONATE IV KIT
25.0000 | PACK | Freq: Once | INTRAVENOUS | Status: AC | PRN
Start: 2015-07-21 — End: 2015-07-21
  Administered 2015-07-21: 25 via INTRAVENOUS

## 2015-07-23 ENCOUNTER — Encounter (HOSPITAL_BASED_OUTPATIENT_CLINIC_OR_DEPARTMENT_OTHER): Payer: Medicare Other

## 2015-07-23 ENCOUNTER — Encounter (HOSPITAL_COMMUNITY): Payer: Medicare Other

## 2015-07-23 ENCOUNTER — Encounter (HOSPITAL_COMMUNITY): Payer: Self-pay

## 2015-07-23 VITALS — BP 127/83 | Temp 97.8°F | Resp 18

## 2015-07-23 DIAGNOSIS — C7951 Secondary malignant neoplasm of bone: Secondary | ICD-10-CM

## 2015-07-23 DIAGNOSIS — C61 Malignant neoplasm of prostate: Secondary | ICD-10-CM | POA: Diagnosis not present

## 2015-07-23 DIAGNOSIS — M899 Disorder of bone, unspecified: Secondary | ICD-10-CM

## 2015-07-23 DIAGNOSIS — Z79899 Other long term (current) drug therapy: Secondary | ICD-10-CM | POA: Diagnosis not present

## 2015-07-23 DIAGNOSIS — Z9889 Other specified postprocedural states: Secondary | ICD-10-CM | POA: Diagnosis not present

## 2015-07-23 DIAGNOSIS — D638 Anemia in other chronic diseases classified elsewhere: Secondary | ICD-10-CM

## 2015-07-23 DIAGNOSIS — E538 Deficiency of other specified B group vitamins: Secondary | ICD-10-CM

## 2015-07-23 DIAGNOSIS — N179 Acute kidney failure, unspecified: Secondary | ICD-10-CM | POA: Diagnosis not present

## 2015-07-23 DIAGNOSIS — N1330 Unspecified hydronephrosis: Secondary | ICD-10-CM | POA: Diagnosis not present

## 2015-07-23 LAB — CBC WITH DIFFERENTIAL/PLATELET
BASOS ABS: 0 10*3/uL (ref 0.0–0.1)
Basophils Relative: 0 %
EOS PCT: 2 %
Eosinophils Absolute: 0.1 10*3/uL (ref 0.0–0.7)
HEMATOCRIT: 36.6 % — AB (ref 39.0–52.0)
Hemoglobin: 12.1 g/dL — ABNORMAL LOW (ref 13.0–17.0)
LYMPHS ABS: 1.8 10*3/uL (ref 0.7–4.0)
LYMPHS PCT: 32 %
MCH: 30 pg (ref 26.0–34.0)
MCHC: 33.1 g/dL (ref 30.0–36.0)
MCV: 90.8 fL (ref 78.0–100.0)
MONO ABS: 0.9 10*3/uL (ref 0.1–1.0)
MONOS PCT: 17 %
Neutro Abs: 2.7 10*3/uL (ref 1.7–7.7)
Neutrophils Relative %: 49 %
PLATELETS: 298 10*3/uL (ref 150–400)
RBC: 4.03 MIL/uL — ABNORMAL LOW (ref 4.22–5.81)
RDW: 15.5 % (ref 11.5–15.5)
WBC: 5.5 10*3/uL (ref 4.0–10.5)

## 2015-07-23 LAB — COMPREHENSIVE METABOLIC PANEL
ALT: 23 U/L (ref 17–63)
AST: 26 U/L (ref 15–41)
Albumin: 3.9 g/dL (ref 3.5–5.0)
Alkaline Phosphatase: 68 U/L (ref 38–126)
Anion gap: 8 (ref 5–15)
BILIRUBIN TOTAL: 0.4 mg/dL (ref 0.3–1.2)
BUN: 25 mg/dL — AB (ref 6–20)
CO2: 24 mmol/L (ref 22–32)
Calcium: 9.3 mg/dL (ref 8.9–10.3)
Chloride: 104 mmol/L (ref 101–111)
Creatinine, Ser: 1.47 mg/dL — ABNORMAL HIGH (ref 0.61–1.24)
GFR, EST AFRICAN AMERICAN: 55 mL/min — AB (ref >60)
GFR, EST NON AFRICAN AMERICAN: 47 mL/min — AB (ref >60)
Glucose, Bld: 96 mg/dL (ref 65–99)
POTASSIUM: 5.1 mmol/L (ref 3.5–5.1)
Sodium: 136 mmol/L (ref 135–145)
TOTAL PROTEIN: 9.5 g/dL — AB (ref 6.5–8.1)

## 2015-07-23 LAB — PSA: PSA: 1.52 ng/mL (ref 0.00–4.00)

## 2015-07-23 MED ORDER — CYANOCOBALAMIN 1000 MCG/ML IJ SOLN
1000.0000 ug | Freq: Once | INTRAMUSCULAR | Status: AC
  Administered 2015-07-23: 1000 ug via INTRAMUSCULAR

## 2015-07-23 MED ORDER — OXYCODONE HCL 5 MG PO TABS: 5.0000 mg | ORAL_TABLET | ORAL | Status: DC | PRN

## 2015-07-23 MED ORDER — DENOSUMAB 120 MG/1.7ML ~~LOC~~ SOLN
120.0000 mg | Freq: Once | SUBCUTANEOUS | Status: AC
  Administered 2015-07-23: 120 mg via SUBCUTANEOUS
  Filled 2015-07-23: qty 1.7

## 2015-07-23 MED ORDER — CYANOCOBALAMIN 1000 MCG/ML IJ SOLN
INTRAMUSCULAR | Status: AC
  Filled 2015-07-23: qty 1

## 2015-07-23 NOTE — Progress Notes (Signed)
Jeff [name] presents today for injection per the provider's orders.  xgeva administration without incident; see MAR for injection details.  Patient tolerated procedure well and without incident.  No questions or complaints noted at this time.

## 2015-07-23 NOTE — Progress Notes (Signed)
Jeff [name] presents today for injection per the provider's orders.  b12 administration without incident; see MAR for injection details.  Patient tolerated procedure well and without incident.  No questions or complaints noted at this time.

## 2015-07-24 LAB — INTRINSIC FACTOR ANTIBODIES: Intrinsic Factor: 1.1 AU/mL (ref 0.0–1.1)

## 2015-07-24 LAB — ANTI-PARIETAL ANTIBODY: Parietal Cell Antibody-IgG: 2.9 Units (ref 0.0–20.0)

## 2015-07-25 ENCOUNTER — Ambulatory Visit (HOSPITAL_COMMUNITY)
Admission: RE | Admit: 2015-07-25 | Discharge: 2015-07-25 | Disposition: A | Discharge status: Home / Self Care | Payer: Medicare Other | Source: Ambulatory Visit | Attending: Oncology | Admitting: Oncology

## 2015-07-25 DIAGNOSIS — C7951 Secondary malignant neoplasm of bone: Secondary | ICD-10-CM | POA: Insufficient documentation

## 2015-07-25 DIAGNOSIS — J849 Interstitial pulmonary disease, unspecified: Secondary | ICD-10-CM | POA: Insufficient documentation

## 2015-07-25 DIAGNOSIS — C61 Malignant neoplasm of prostate: Secondary | ICD-10-CM | POA: Diagnosis not present

## 2015-07-25 MED ORDER — IOHEXOL 300 MG/ML  SOLN
100.0000 mL | Freq: Once | INTRAMUSCULAR | Status: AC | PRN
  Administered 2015-07-25: 100 mL via INTRAVENOUS

## 2015-08-20 ENCOUNTER — Other Ambulatory Visit (HOSPITAL_COMMUNITY): Payer: Self-pay | Admitting: Oncology

## 2015-08-20 ENCOUNTER — Encounter (HOSPITAL_COMMUNITY): Payer: Medicare Other | Attending: Hematology & Oncology

## 2015-08-20 ENCOUNTER — Encounter (HOSPITAL_COMMUNITY): Payer: Medicare Other

## 2015-08-20 DIAGNOSIS — C61 Malignant neoplasm of prostate: Secondary | ICD-10-CM

## 2015-08-20 DIAGNOSIS — M899 Disorder of bone, unspecified: Secondary | ICD-10-CM

## 2015-08-20 DIAGNOSIS — C7951 Secondary malignant neoplasm of bone: Secondary | ICD-10-CM

## 2015-08-20 LAB — COMPREHENSIVE METABOLIC PANEL
ALBUMIN: 3.6 g/dL (ref 3.5–5.0)
ALK PHOS: 50 U/L (ref 38–126)
ALT: 19 U/L (ref 17–63)
AST: 27 U/L (ref 15–41)
Anion gap: 6 (ref 5–15)
BILIRUBIN TOTAL: 0.1 mg/dL — AB (ref 0.3–1.2)
BUN: 17 mg/dL (ref 6–20)
CALCIUM: 8.5 mg/dL — AB (ref 8.9–10.3)
CO2: 23 mmol/L (ref 22–32)
CREATININE: 1.38 mg/dL — AB (ref 0.61–1.24)
Chloride: 106 mmol/L (ref 101–111)
GFR calc non Af Amer: 51 mL/min — ABNORMAL LOW (ref >60)
GFR, EST AFRICAN AMERICAN: 59 mL/min — AB (ref >60)
GLUCOSE: 92 mg/dL (ref 65–99)
Potassium: 4.3 mmol/L (ref 3.5–5.1)
SODIUM: 135 mmol/L (ref 135–145)
Total Protein: 8.3 g/dL — ABNORMAL HIGH (ref 6.5–8.1)

## 2015-08-20 MED ORDER — CYANOCOBALAMIN 1000 MCG/ML IJ SOLN
1000.0000 ug | Freq: Once | INTRAMUSCULAR | Status: AC
  Administered 2015-08-20: 1000 ug via INTRAMUSCULAR

## 2015-08-20 MED ORDER — CYANOCOBALAMIN 1000 MCG/ML IJ SOLN
INTRAMUSCULAR | Status: AC
  Filled 2015-08-20: qty 1

## 2015-08-20 MED ORDER — SODIUM CHLORIDE 0.9% FLUSH
20.0000 mL | INTRAVENOUS | Status: DC | PRN
  Administered 2015-08-20: 20 mL via INTRAVENOUS
  Filled 2015-08-20: qty 20

## 2015-08-20 MED ORDER — HEPARIN SOD (PORK) LOCK FLUSH 100 UNIT/ML IV SOLN
500.0000 [IU] | Freq: Once | INTRAVENOUS | Status: AC
  Administered 2015-08-20: 500 [IU] via INTRAVENOUS

## 2015-08-20 MED ORDER — OXYCODONE HCL 5 MG PO TABS: 5.0000 mg | ORAL_TABLET | ORAL | Status: DC | PRN

## 2015-08-20 MED ORDER — HEPARIN SOD (PORK) LOCK FLUSH 100 UNIT/ML IV SOLN
INTRAVENOUS | Status: AC
  Filled 2015-08-20: qty 5

## 2015-08-20 NOTE — Progress Notes (Signed)
Jeff [name] presented for Portacath access and flush. Proper placement of portacath confirmed by CXR. Portacath located right chest wall accessed with  H 20 needle. Good blood return present.  Specimen taken for labs.   Portacath flushed with 69ml NS and 500U/43ml Heparin and needle removed intact. Procedure without incident. Patient tolerated procedure well.  Jeff [name] presents today for injection per MD orders. B12 1090mcg administered IM in left Upper Arm. Administration without incident. Patient tolerated well.

## 2015-08-20 NOTE — Progress Notes (Signed)
Please see other encounter for documentation.   

## 2015-08-20 NOTE — Patient Instructions (Signed)
[place] at [facility] Discharge Instructions  RECOMMENDATIONS MADE BY THE CONSULTANT AND ANY TEST RESULTS WILL BE SENT TO YOUR REFERRING PHYSICIAN.  B12 injection. Port flush with labs today.    Thank you for choosing [place] at [facility] to provide your oncology and hematology care.  To afford each patient quality time with our provider, please arrive at least 15 minutes before your scheduled appointment time.   Beginning January 23rd 2017 lab work for the [facility] will be done in the  Main lab at [facility] on 1st floor. If you have a lab appointment with the [place] please come in thru the  Main Entrance and check in at the main information desk  You need to re-schedule your appointment should you arrive 10 or more minutes late.  We strive to give you quality time with our providers, and arriving late affects you and other patients whose appointments are after yours.  Also, if you no show three or more times for appointments you may be dismissed from the clinic at the providers discretion.     Again, thank you for choosing [facility].  Our hope is that these requests will decrease the amount of time that you wait before being seen by our physicians.       _____________________________________________________________  Should you have questions after your visit to [facility], please contact our office at [phone] between the hours of 8:30 a.m. and 4:30 p.m.  Voicemails left after 4:30 p.m. will not be returned until the following business day.  For prescription refill requests, have your pharmacy contact our office.         Resources For Cancer Patients and their Caregivers ? American Cancer Society: Can assist with transportation, wigs, general needs, runs Look Good Feel Better.        [phone] ? Cancer Care: Provides financial assistance, online support groups, medication/co-pay  assistance.  1-800-813-HOPE 480-220-1394) ? [facility] Assists [place] cancer patients and their families through emotional , educational and financial support.  (928) 727-3253 ? [place] DSS Where to apply for food stamps, Medicaid and utility assistance. (343)506-1700 ? RCATS: Transportation to medical appointments. 336-531-9430 ? Social Security Administration: May apply for disability if have a Stage IV cancer. 775-385-3306 (612)227-1382 ? [employer], Disability and Transit Services: Assists with nutrition, care and transit needs. (906)544-9739

## 2015-08-25 ENCOUNTER — Ambulatory Visit (HOSPITAL_COMMUNITY): Payer: Medicare Other

## 2015-09-17 ENCOUNTER — Encounter (HOSPITAL_COMMUNITY): Payer: Self-pay | Admitting: Hematology & Oncology

## 2015-09-17 ENCOUNTER — Encounter (HOSPITAL_COMMUNITY): Payer: Medicare Other

## 2015-09-17 ENCOUNTER — Encounter (HOSPITAL_BASED_OUTPATIENT_CLINIC_OR_DEPARTMENT_OTHER): Payer: Medicare Other

## 2015-09-17 ENCOUNTER — Encounter (HOSPITAL_COMMUNITY): Payer: Medicare Other | Attending: Hematology & Oncology | Admitting: Hematology & Oncology

## 2015-09-17 VITALS — BP 108/73 | HR 80 | Temp 97.6°F | Resp 16 | Wt 166.9 lb

## 2015-09-17 DIAGNOSIS — Z720 Tobacco use: Secondary | ICD-10-CM | POA: Diagnosis not present

## 2015-09-17 DIAGNOSIS — C61 Malignant neoplasm of prostate: Secondary | ICD-10-CM

## 2015-09-17 DIAGNOSIS — M899 Disorder of bone, unspecified: Secondary | ICD-10-CM | POA: Insufficient documentation

## 2015-09-17 DIAGNOSIS — C7951 Secondary malignant neoplasm of bone: Secondary | ICD-10-CM

## 2015-09-17 LAB — COMPREHENSIVE METABOLIC PANEL
ALK PHOS: 52 U/L (ref 38–126)
ALT: 21 U/L (ref 17–63)
AST: 25 U/L (ref 15–41)
Albumin: 3.7 g/dL (ref 3.5–5.0)
Anion gap: 6 (ref 5–15)
BUN: 18 mg/dL (ref 6–20)
CALCIUM: 8.8 mg/dL — AB (ref 8.9–10.3)
CO2: 25 mmol/L (ref 22–32)
CREATININE: 1.6 mg/dL — AB (ref 0.61–1.24)
Chloride: 105 mmol/L (ref 101–111)
GFR calc non Af Amer: 43 mL/min — ABNORMAL LOW (ref >60)
GFR, EST AFRICAN AMERICAN: 49 mL/min — AB (ref >60)
Glucose, Bld: 96 mg/dL (ref 65–99)
Potassium: 3.9 mmol/L (ref 3.5–5.1)
SODIUM: 136 mmol/L (ref 135–145)
Total Bilirubin: 0.3 mg/dL (ref 0.3–1.2)
Total Protein: 8.6 g/dL — ABNORMAL HIGH (ref 6.5–8.1)

## 2015-09-17 LAB — CBC WITH DIFFERENTIAL/PLATELET
Basophils Absolute: 0 10*3/uL (ref 0.0–0.1)
Basophils Relative: 0 %
EOS ABS: 0.1 10*3/uL (ref 0.0–0.7)
Eosinophils Relative: 2 %
HEMATOCRIT: 37.1 % — AB (ref 39.0–52.0)
HEMOGLOBIN: 12 g/dL — AB (ref 13.0–17.0)
LYMPHS ABS: 1.4 10*3/uL (ref 0.7–4.0)
LYMPHS PCT: 28 %
MCH: 29.2 pg (ref 26.0–34.0)
MCHC: 32.3 g/dL (ref 30.0–36.0)
MCV: 90.3 fL (ref 78.0–100.0)
Monocytes Absolute: 0.4 10*3/uL (ref 0.1–1.0)
Monocytes Relative: 8 %
NEUTROS PCT: 62 %
Neutro Abs: 3.2 10*3/uL (ref 1.7–7.7)
Platelets: 280 10*3/uL (ref 150–400)
RBC: 4.11 MIL/uL — AB (ref 4.22–5.81)
RDW: 15.3 % (ref 11.5–15.5)
WBC: 5.1 10*3/uL (ref 4.0–10.5)

## 2015-09-17 LAB — PSA: PSA: 1.9 ng/mL (ref 0.00–4.00)

## 2015-09-17 MED ORDER — CYANOCOBALAMIN 1000 MCG/ML IJ SOLN
1000.0000 ug | Freq: Once | INTRAMUSCULAR | Status: AC
  Administered 2015-09-17: 1000 ug via INTRAMUSCULAR

## 2015-09-17 MED ORDER — OXYCODONE HCL 5 MG PO TABS: 5.0000 mg | ORAL_TABLET | ORAL | Status: DC | PRN

## 2015-09-17 MED ORDER — CALCIUM CARBONATE-VITAMIN D 500-200 MG-UNIT PO TABS: 2.0000 | ORAL_TABLET | Freq: Every day | ORAL | Status: DC

## 2015-09-17 MED ORDER — CYANOCOBALAMIN 1000 MCG/ML IJ SOLN
INTRAMUSCULAR | Status: AC
  Filled 2015-09-17: qty 1

## 2015-09-17 NOTE — Progress Notes (Signed)
Please see other encounter for documentation.   

## 2015-09-17 NOTE — Progress Notes (Signed)
Jeff [name] presents today for injection per MD orders. B12 1052mcg administered IM in right Upper Arm. Administration without incident. Patient tolerated well.

## 2015-09-17 NOTE — Patient Instructions (Signed)
[place] at [facility]  Discharge Instructions:  Return to clinic in 3 months and labs    _______________________________________________________________  Thank you for choosing [place] at [facility] to provide your oncology and hematology care.  To afford each patient quality time with our providers, please arrive at least 15 minutes before your scheduled appointment.  You need to re-schedule your appointment if you arrive 10 or more minutes late.  We strive to give you quality time with our providers, and arriving late affects you and other patients whose appointments are after yours.  Also, if you no show three or more times for appointments you may be dismissed from the clinic.  Again, thank you for choosing [place] at [place] hope is that these requests will allow you access to exceptional care and in a timely manner. _______________________________________________________________  If you have questions after your visit, please contact our office at [phone] between the hours of 8:30 a.m. and 5:00 p.m. Voicemails left after 4:30 p.m. will not be returned until the following business day. _______________________________________________________________  For prescription refill requests, have your pharmacy contact our office. _______________________________________________________________  Recommendations made by the consultant and any test results will be sent to your referring physician. _______________________________________________________________

## 2015-09-17 NOTE — Patient Instructions (Signed)
[facility] at [facility] Discharge Instructions  RECOMMENDATIONS MADE BY THE CONSULTANT AND ANY TEST RESULTS WILL BE SENT TO YOUR REFERRING PHYSICIAN.  B12 today.    Thank you for choosing [facility] at [facility] to provide your oncology and hematology care.  To afford each patient quality time with our provider, please arrive at least 15 minutes before your scheduled appointment time.   Beginning January 23rd 2017 lab work for the Cancer Center will be done in the  Main lab at [place] on 1st floor. If you have a lab appointment with the Cancer Center please come in thru the  Main Entrance and check in at the main information desk  You need to re-schedule your appointment should you arrive 10 or more minutes late.  We strive to give you quality time with our providers, and arriving late affects you and other patients whose appointments are after yours.  Also, if you no show three or more times for appointments you may be dismissed from the clinic at the providers discretion.     Again, thank you for choosing [facility].  Our hope is that these requests will decrease the amount of time that you wait before being seen by our physicians.       _____________________________________________________________  Should you have questions after your visit to [facility], please contact our office at [phone] between the hours of 8:30 a.m. and 4:30 p.m.  Voicemails left after 4:30 p.m. will not be returned until the following business day.  For prescription refill requests, have your pharmacy contact our office.         Resources For Cancer Patients and their Caregivers ? American Cancer Society: Can assist with transportation, wigs, general needs, runs Look Good Feel Better.        1-888-227-6333 ? Cancer Care: Provides financial assistance, online support groups, medication/co-pay assistance.  1-800-813-HOPE  (4673) ? [facility] Assists [place] cancer patients and their families through emotional , educational and financial support.  336-427-4357 ? [place] DSS Where to apply for food stamps, Medicaid and utility assistance. 336-342-1394 ? RCATS: Transportation to medical appointments. 336-347-2287 ? Social Security Administration: May apply for disability if have a Stage IV cancer. 336-342-7796 1-800-772-1213 ? [place] A[facility], Disability and Transit Services: Assists with nutrition, care and transit needs. 336-349-2343  Cancer Center Support Programs: @10RELATIVEDAYS@ > Cancer Support Group  2nd Tuesday of the month 1pm-2pm, Journey Room  > Creative Journey  3rd Tuesday of the month 1130am-1pm, Journey Room  > Look Good Feel Better  1st Wednesday of the month 10am-12 noon, Journey Room (Call American Cancer Society to register 1-800-395-5775)    

## 2015-09-17 NOTE — Progress Notes (Signed)
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 Junious Silk   02/26/2014 Tumor Marker PSA = 399.5   03/14/2014 Tumor Marker PSA= 89.51   03/14/2014 - 06/26/2014 Chemotherapy Docetaxel 75 mg/kg every 21 days   04/24/2014 Tumor Marker PSA- 19.89   07/23/2014 Procedure Nephrostomy tube removed by IR, Dr. Geroge Baseman   07/24/2014 Tumor Marker PSA= 6.15   10/16/2014 Tumor Marker PSA: 3.54    01/08/2015 Tumor Marker PSA: 2.13    01/17/2015 Imaging CT CAP-  Massive pelvic and retroperitoneal lymphadenopathy noted on the prior study has nearly completely resolved, now with only a small amount of residual amorphous soft tissue predominantly around the the infrarenal abdominal aorta.   01/17/2015 Imaging Bone scan- Widespread osseous metastatic disease with multiple foci of increased activity throughout the skeleton, corresponding with the findings on the prior CT from October, 2015.   04/11/2015 Tumor Marker PSA: 1.87    07/25/2015 Imaging CT CAP- Stable matted soft tissue density in the retroperitoneum and extraperitoneal pelvis consistent with treated disease. No recurrent lymphadenopathy.  No pulmonary metastatic disease. Diffuse stable sclerotic metastatic bone disease.      CURRENT THERAPY:Observation. INTERVAL HISTORY: Jeff [name] 70 y.o. male returns for follow-up of his hormone sensitive stage IV prostate cancer. He has completed Taxotere. He is doing remarkably well. He has had a bilateral orchiectomy.  [name] is unaccompanied.   He continues to work. He has  been eating well with a good appetite. Denies pain, nausea, chest pain, or urinary issues. Admits to hot flashes every once in a while, describing them as terrible "sometimes".   For the last couple of weeks, he has had trouble sleeping. Describes these as restless nights where he is unable to get to sleep after getting home from work, eating supper, and showering. He often stays up watching television unable to fall asleep. He is not sure why this has developed. One night he is able to sleep and the next he is not. He denies any new stressors or pain.  MEDICAL HISTORY: Past Medical History  Diagnosis Date  . Acute renal failure ([place]) 01/26/2014  . Anemia of chronic disease 01/26/2014  . Bilateral hydronephrosis 01/26/2014  . History of cocaine abuse 01/26/2014  . Homelessness 01/31/2014  . 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 Alcohol abuse   . UTI (lower urinary tract infection)   . Sepsis ([place])     has Bone lesion; Acute renal failure ([place]); Bilateral hydronephrosis; Anemia of chronic disease; History of cocaine abuse; Prostate cancer metastatic to multiple sites [facility]); Homelessness; Health care maintenance; Sepsis ([place]); UTI (lower urinary tract infection); Malnutrition of moderate degree ([place]); Bacteremia; and Pyrexia on his problem list.      has No Known Allergies.  [name] does not currently have medications on file.  SURGICAL HISTORY: Past Surgical History  Procedure Laterality Date  . Orchiectomy Bilateral 02/05/2014    Procedure: BILATERAL ORCHIECTOMY;  Surgeon: Festus Aloe, MD;  Location: [facility];  Service: Urology;  Laterality: Bilateral;  . Portacath placement Right 03/12/14  . Percutaneous nephrostomy Bilateral     IR Dr.  Eskridge changed on 04/22/2014    SOCIAL HISTORY: Social History   Social History  . Marital Status: Single    Spouse Name: N/A  . Number of Children: 1  . Years of Education: 9th   Occupational History  .  disabilty    Social History Main Topics  . Smoking status: Current Every Day Smoker -- 0.25 packs/day for 55 years    Types: Cigarettes  . Smokeless tobacco: Never Used  . Alcohol Use: No     Comment: none for at least 7 -8 years  . Drug Use: Yes    Special: Cocaine     Comment: last used Cocaine 01/25/14  . Sexual Activity: Yes   Other Topics Concern  . Not on file   Social History Narrative   Lives at [employer] 9th grade   1 son, lives in [place], [place]   Can read and write in native language             FAMILY HISTORY: Family History  Problem Relation Age of Onset  . Cancer Brother 60    Review of Systems  Constitutional: Negative for fever, chills, weight loss and malaise/fatigue.  HENT: Negative for congestion, hearing loss, nosebleeds, sore throat and tinnitus.   Eyes: Negative for blurred vision, double vision, pain and discharge.  Respiratory: Negative for cough, hemoptysis, sputum production, shortness of breath and wheezing.   Cardiovascular: Negative for chest pain, palpitations, claudication, leg swelling and PND.  Gastrointestinal: Negative for heartburn, nausea, vomiting, abdominal pain, diarrhea, constipation, blood in stool and melena.  Genitourinary: Negative for dysuria, urgency, frequency and hematuria.  Musculoskeletal: Positive for back pain. Negative for myalgias, joint pain and falls.  Skin: Negative for itching and rash.  Neurological: Negative for dizziness, tingling, tremors, sensory change, speech change, focal weakness, seizures, loss of consciousness, weakness and headaches.  Endo/Heme/Allergies: Does not bruise/bleed easily.  Psychiatric/Behavioral: Positive for insomnia. Negative for depression, suicidal ideas, memory loss and substance abuse. The patient is not nervous/anxious. Intermittent insomnia    14 point review of systems was performed and is negative except as detailed under history of present illness and  above  PHYSICAL EXAMINATION  ECOG PERFORMANCE STATUS: 0 - Asymptomatic  Filed Vitals:   09/17/15 1048  BP: 108/73  Pulse: 80  Temp: 97.6 F (36.4 C)  Resp: 16    Physical Exam  Constitutional: He is oriented to person, place, and time and well-developed, well-nourished, and in no distress.  HENT:  Head: Normocephalic and atraumatic.  Nose: Nose normal.  Mouth/Throat: Oropharynx is clear and moist. No oropharyngeal exudate.  Poor dentition  Eyes: Conjunctivae and EOM are normal. Pupils are equal, round, and reactive to light. Right eye exhibits no discharge. Left eye exhibits no discharge. No scleral icterus.  Neck: Normal range of motion. Neck supple. No tracheal deviation present. No thyromegaly present.  Cardiovascular: Normal rate, regular rhythm and normal heart sounds.  Exam reveals no gallop and no friction rub.   No murmur heard. Pulmonary/Chest: Effort normal and breath sounds normal. He has no wheezes. He has no rales.  Abdominal: Soft. Bowel sounds are normal. He exhibits no distension and no mass. There is no tenderness. There is no rebound and no guarding.  Musculoskeletal: Normal range of motion. He exhibits no edema.  Lymphadenopathy:    He has no cervical adenopathy.  Neurological: He is alert and oriented to person, place, and time. He has normal reflexes. No cranial nerve deficit. Gait normal.  Coordination normal.  Skin: Skin is warm and dry. No rash noted.  Psychiatric: Mood, memory, affect and judgment normal.  Nursing note and vitals reviewed.   LABORATORY DATA: I have reviewed the labs below.  CBC    Component Value Date/Time   WBC 5.1 09/17/2015 1029   RBC 4.11* 09/17/2015 1029   HGB 12.0* 09/17/2015 1029   HCT 37.1* 09/17/2015 1029   PLT 280 09/17/2015 1029   MCV 90.3 09/17/2015 1029   MCH 29.2 09/17/2015 1029   MCHC 32.3 09/17/2015 1029   RDW 15.3 09/17/2015 1029   LYMPHSABS 1.4 09/17/2015 1029   MONOABS 0.4 09/17/2015 1029   EOSABS 0.1  09/17/2015 1029   BASOSABS 0.0 09/17/2015 1029   CMP     Component Value Date/Time   NA 136 09/17/2015 1029   K 3.9 09/17/2015 1029   CL 105 09/17/2015 1029   CO2 25 09/17/2015 1029   GLUCOSE 96 09/17/2015 1029   BUN 18 09/17/2015 1029   CREATININE 1.60* 09/17/2015 1029   CREATININE 1.39* 02/06/2014 1012   CALCIUM 8.8* 09/17/2015 1029   PROT 8.6* 09/17/2015 1029   ALBUMIN 3.7 09/17/2015 1029   AST 25 09/17/2015 1029   ALT 21 09/17/2015 1029   ALKPHOS 52 09/17/2015 1029   BILITOT 0.3 09/17/2015 1029   GFRNONAA 43* 09/17/2015 1029   GFRAA 49* 09/17/2015 1029   RADIOLOGY: I have personally reviewed the radiological images as listed and agreed with the findings in the report. Study Result     CLINICAL DATA: Restaging metastatic prostate cancer.  EXAM: CT CHEST, ABDOMEN, AND PELVIS WITH CONTRAST  TECHNIQUE: Multidetector CT imaging of the chest, abdomen and pelvis was performed following the standard protocol during bolus administration of intravenous contrast.  CONTRAST: 174mL OMNIPAQUE IOHEXOL 300 MG/ML SOLN  COMPARISON: 01/17/2015  FINDINGS: CT CHEST FINDINGS  Mediastinum/Lymph Nodes: No chest wall mass, supraclavicular or axillary lymphadenopathy. The thyroid gland is grossly normal. The right Port-A-Cath is stable.  The heart is borderline enlarged but stable. No pericardial effusion. Stable three-vessel coronary artery calcifications. The aorta is normal in caliber. No dissection. No significant atherosclerotic calcifications.  No mediastinal or hilar mass or adenopathy. A few small scattered lymph nodes are stable.  The esophagus is grossly normal. Stable moderate-sized hiatal hernia.  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 No pulmonary metastatic disease. No pleural effusion.  Musculoskeletal: Diffuse sclerotic osseous metastatic disease but no interval change when compared to the prior  examination. No new lesions or pathologic fracture.  CT ABDOMEN PELVIS FINDINGS  Hepatobiliary: No focal hepatic lesions or intrahepatic biliary dilatation. The gallbladder is normal. No common bile duct dilatation.  Pancreas: Nodal mass, inflammation or ductal dilatation.  Spleen: Normal size. No focal lesions.  Adrenals/Urinary Tract: The adrenal glands and right kidney are unremarkable. The left kidney is small and scarred.  Stomach/Bowel: The stomach, duodenum, small bowel and colon are grossly normal. No inflammatory changes, mass lesions or obstructive findings. Moderate stool throughout the colon may suggest constipation. The terminal ileum is normal. The appendix is normal.  Vascular/Lymphatic: Stable matted soft tissue density in the retroperitoneum surrounding the aorta consistent with treated disease. No recurrent lymphadenopathy. Similar findings in the pelvis with matted soft tissue density in the left pelvic sidewall area but no recurrent disease. No inguinal adenopathy.  Stable atherosclerotic calcifications involving the aorta. The branch vessels are patent. The major venous structures are patent.  Reproductive: The bladder, prostate gland and seminal vesicles are stable.  Other: No abdominal wall hernia or subcutaneous lesions.  Musculoskeletal: Stable diffuse sclerotic osseous metastatic disease.  IMPRESSION: 1. Stable matted soft tissue density in the retroperitoneum and extraperitoneal pelvis consistent with treated disease. No recurrent lymphadenopathy. 2. Stable emphysematous changes and interstitial lung disease. No pulmonary metastatic disease. 3. Diffuse stable sclerotic metastatic bone disease.   Electronically Signed  By: Marijo Sanes M.D.  On: 07/25/2015 11:01     ASSESSMENT and THERAPY PLAN:   Stage IV prostate cancer, hormone sensitive disease. Bilateral nephrostomy tubes Bilateral orchiectomy History of  polysubstance abuse  Anemia, multifactorial  Tobacco Use   70 year old male with stage IV adenocarcinoma of the prostate, hormone sensitive disease.  He had a bilateral orchiectomy. He continues on XGEVA monthly. He was again advised to take calcium and vitamin D daily.   I discussed with him ongoing surveillance after completion of his chemotherapy. I will see him again in 3 months with repeat labs including a PSA. He will continue with monthly XGEVA.  I have refilled calcium and pain medication, at the patient's request.  Smoking cessation was addressed again with the patient. We discussed methods of quitting, he will think on this. Will continue to address at each follow-up.   All questions were answered. The patient knows to call the clinic with any problems, questions or concerns. We can certainly see the patient much sooner if necessary.  This document serves as a record of services personally performed by Ancil Linsey, MD. It was created on her behalf by Arlyce Harman, a trained medical scribe. The creation of this record is based on the scribe's personal observations and the provider's statements to them. This document has been checked and approved by the attending provider.  I have reviewed the above documentation for accuracy and completeness, and I agree with the above.  Molli Hazard, MD  09/17/2015

## 2015-10-01 IMAGING — XA IR NEPHROSTOGRAM EXISTING ACCESS LEFT
5 series · 13 of 17 positions shown · non-contrast
Comparison: none

CLINICAL DATA: 68-year-old male with a history of metastatic
prostate cancer and retroperitoneal lymphadenopathy resulting in
bilateral ureteral obstruction. Percutaneous nephrostomy tubes were
placed on 01/28/2014. Patient is had an excellent response to
chemotherapy and patency of the ureters was demonstrated last week
on 07/15/2014.
TECHNIQUE: Informed consent was obtained from the patient following explanation
of the procedure, risks, benefits and alternatives. The patient
understands, agrees and consents for the procedure. All questions
were addressed. A time out was performed.

[Series 2: body 4 · 2 of 7 frames shown]
[frame 2/7]
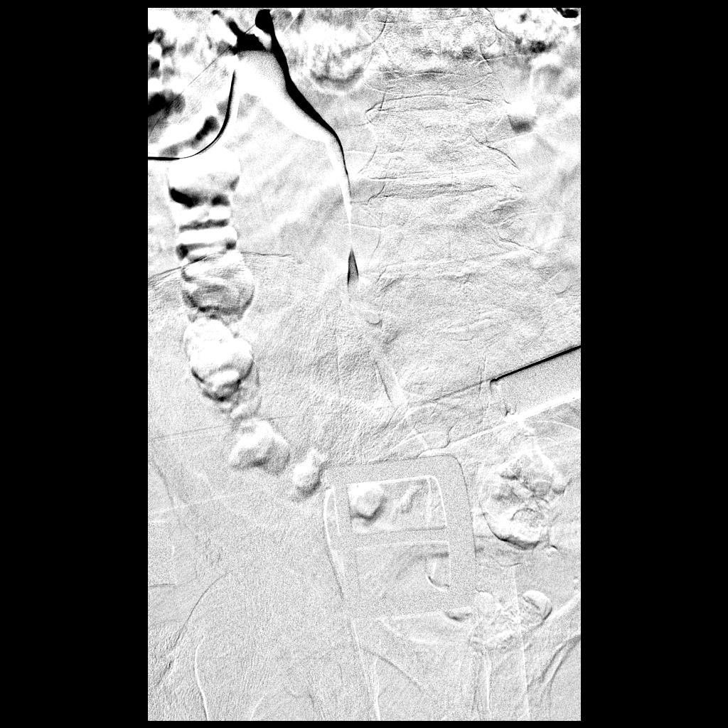
[frame 4/7]
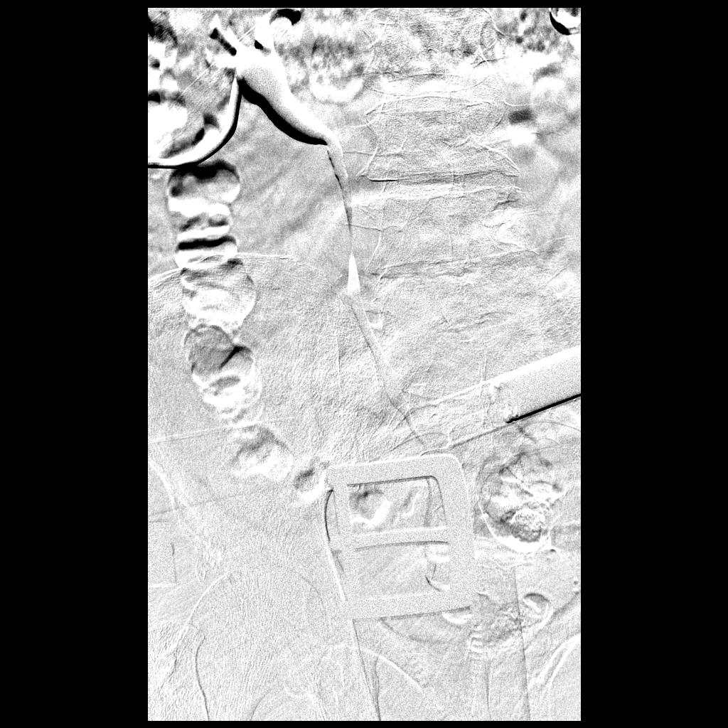

[Series 4: fl - angio · 3 of 30 frames shown (1 of 2)]
[frame 5/30]
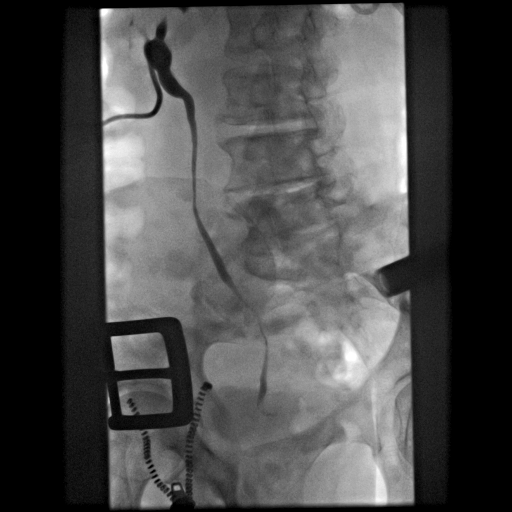
[frame 16/30]
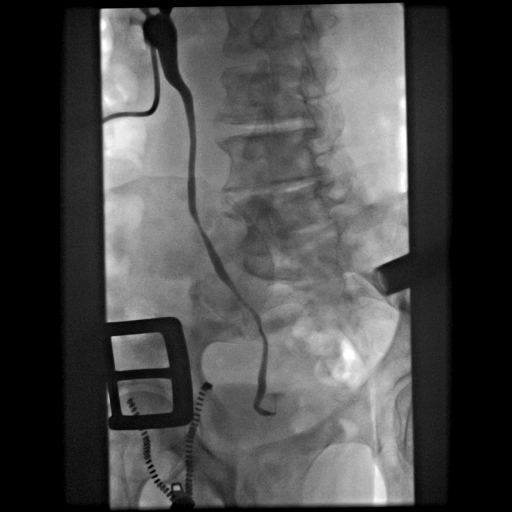
[frame 19/30]
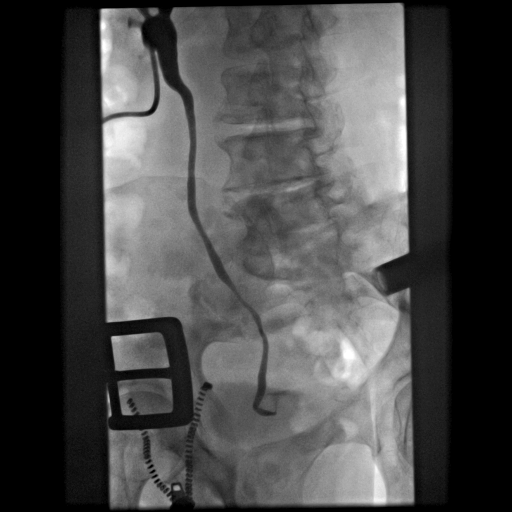

[Series 5: care single · 1 of 1 slices shown]
[im 1/1]
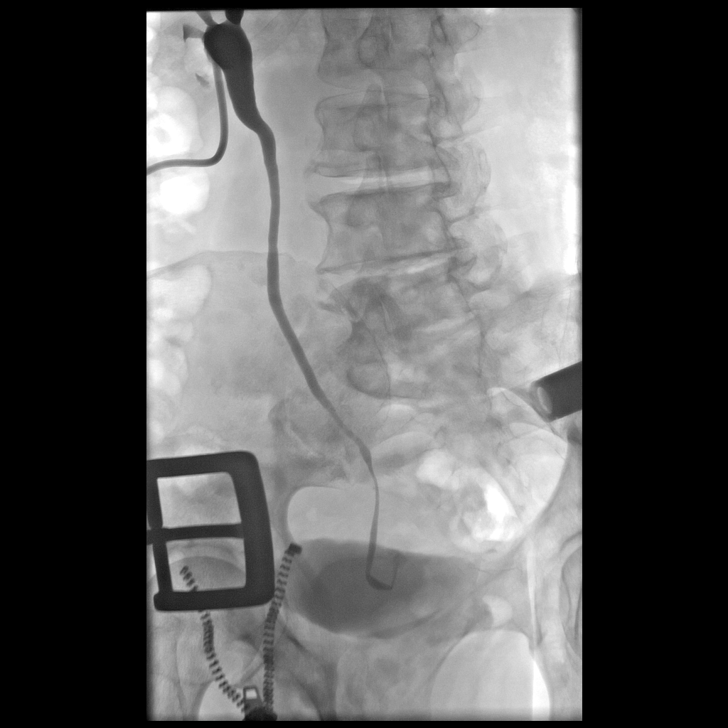

[Series 7: fl - angio · 3 of 40 frames shown (2 of 2)]
[frame 7/40]
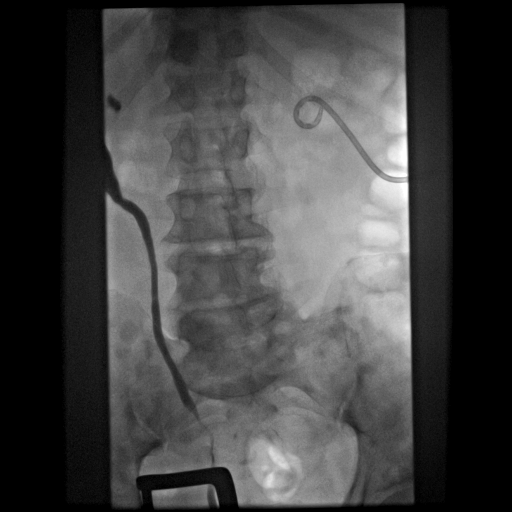
[frame 10/40]
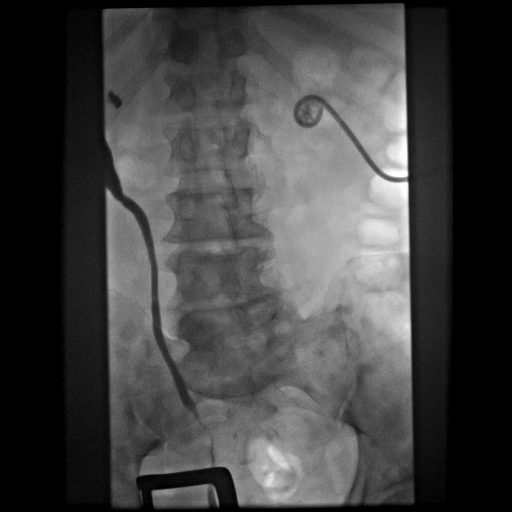
[frame 35/40]
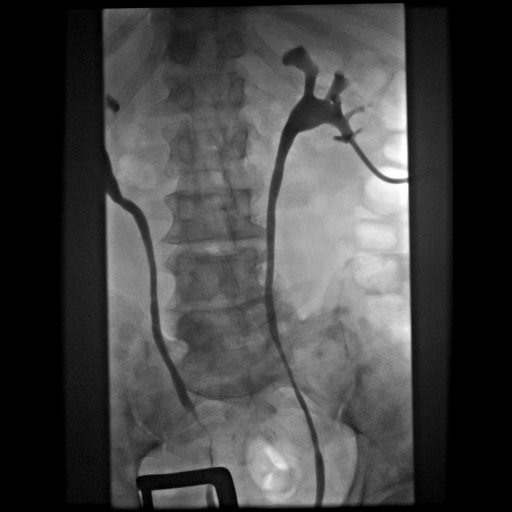

[Series 300: ir nephrostogram right thru existing acc · 4 of 5 slices shown]
[im 1/5]
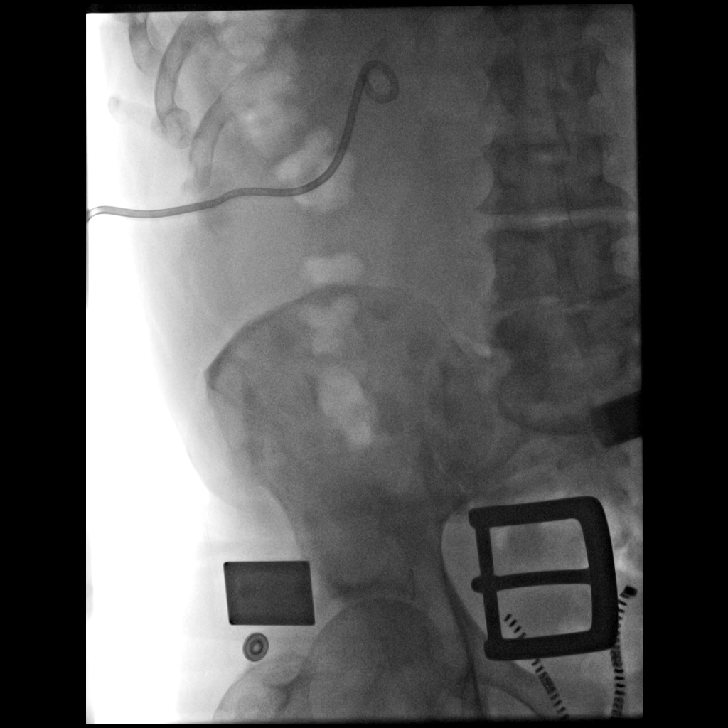
[im 2/5]
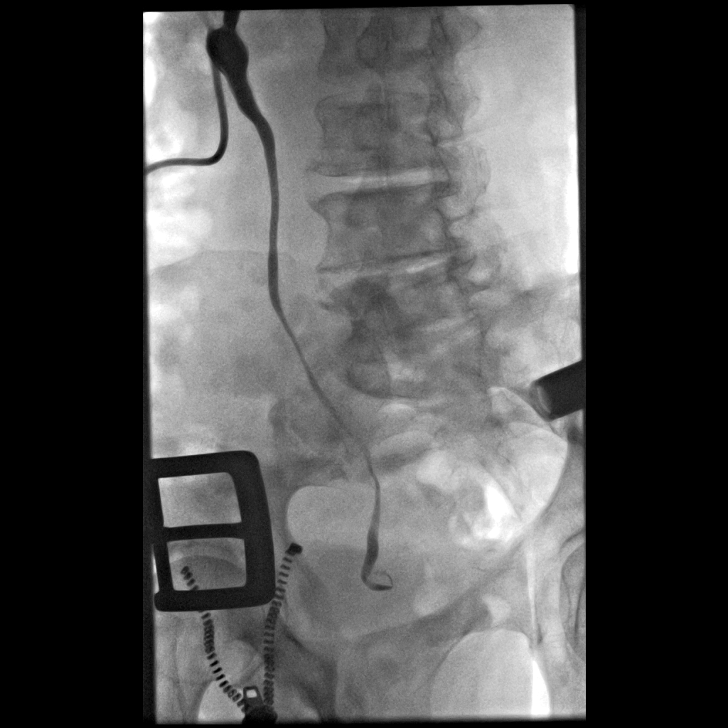
[im 4/5]
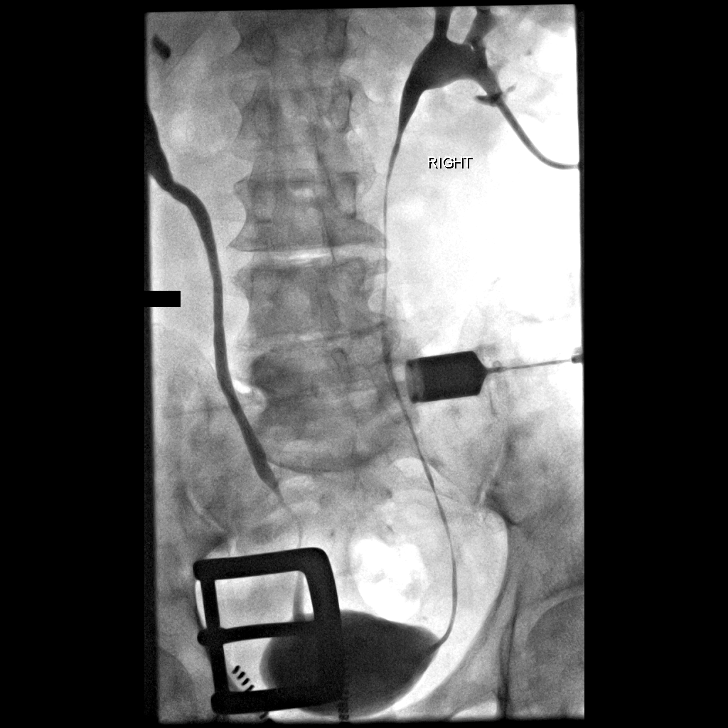
[im 5/5]
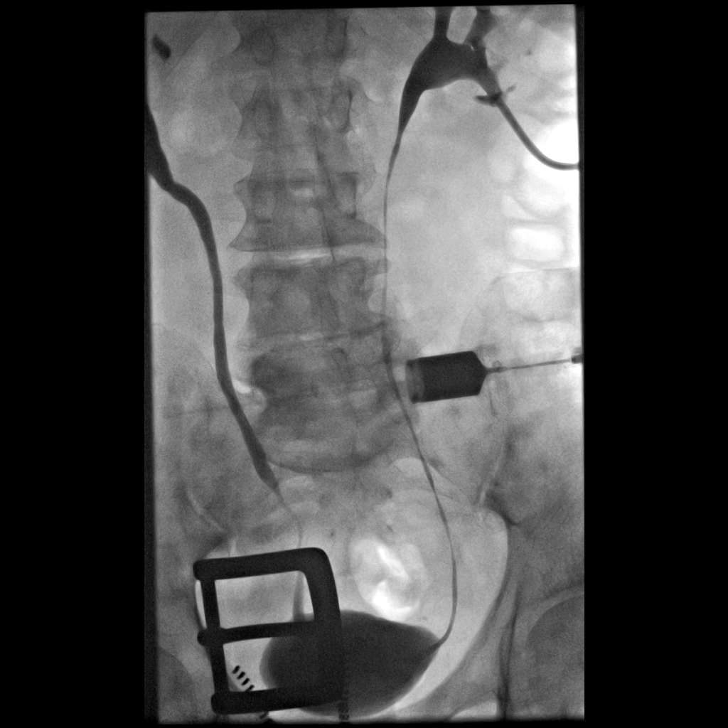

[13 of 17 positions shown; findings below may reference images not displayed]

Patient presents today after tolerating both of his tubes capped for
the past week. He has had no flank pain, or drainage. He is
urinating normally. He presents for final nephrostogram and tube
removal.

EXAM:
IR NEPHROSTOGRAM EXISTING ACCESS LEFT; IR NEPHROSTOGRAM EXISTING
ACCESS RIGHT

Date: 07/23/2014

PROCEDURE:
1. Bilateral nephrostogram
2. Bilateral percutaneous nephrostomy tube removal

ANESTHESIA/SEDATION:
None required

MEDICATIONS:
None required

FLUOROSCOPY TIME:  40 seconds

36 mGy

CONTRAST:  20 mL Omnipaque 300 administered intravenously
A gentle hand injection of contrast material were performed
bilaterally. There is no evidence of hydronephrosis. Both ureters
remain patent.

The tubes were cut and gently removed over a 0.035 inch wire.
Sterile bandages were placed over the nephrostomy tube sites. The
patient tolerated the procedure well.

COMPLICATIONS:
None
IMPRESSION: 1. Persistent patency of both ureters without clinical evidence of
obstruction.
2. Bilateral nephrostomy tubes were removed.

## 2015-10-15 ENCOUNTER — Encounter (HOSPITAL_COMMUNITY): Payer: Medicare Other

## 2015-10-15 ENCOUNTER — Encounter (HOSPITAL_COMMUNITY): Payer: Medicare Other | Attending: Hematology & Oncology

## 2015-10-15 ENCOUNTER — Other Ambulatory Visit (HOSPITAL_COMMUNITY): Payer: Self-pay | Admitting: Oncology

## 2015-10-15 DIAGNOSIS — C61 Malignant neoplasm of prostate: Secondary | ICD-10-CM | POA: Diagnosis not present

## 2015-10-15 DIAGNOSIS — M899 Disorder of bone, unspecified: Secondary | ICD-10-CM | POA: Diagnosis not present

## 2015-10-15 LAB — COMPREHENSIVE METABOLIC PANEL
ALBUMIN: 3.9 g/dL (ref 3.5–5.0)
ALT: 27 U/L (ref 17–63)
AST: 35 U/L (ref 15–41)
Alkaline Phosphatase: 52 U/L (ref 38–126)
Anion gap: 5 (ref 5–15)
BUN: 26 mg/dL — AB (ref 6–20)
CHLORIDE: 103 mmol/L (ref 101–111)
CO2: 26 mmol/L (ref 22–32)
CREATININE: 1.88 mg/dL — AB (ref 0.61–1.24)
Calcium: 8.9 mg/dL (ref 8.9–10.3)
GFR calc Af Amer: 41 mL/min — ABNORMAL LOW (ref >60)
GFR calc non Af Amer: 35 mL/min — ABNORMAL LOW (ref >60)
GLUCOSE: 93 mg/dL (ref 65–99)
POTASSIUM: 4.5 mmol/L (ref 3.5–5.1)
Sodium: 134 mmol/L — ABNORMAL LOW (ref 135–145)
Total Bilirubin: 0.4 mg/dL (ref 0.3–1.2)
Total Protein: 8.6 g/dL — ABNORMAL HIGH (ref 6.5–8.1)

## 2015-10-15 LAB — CBC WITH DIFFERENTIAL/PLATELET
Basophils Absolute: 0 10*3/uL (ref 0.0–0.1)
Basophils Relative: 0 %
EOS ABS: 0.1 10*3/uL (ref 0.0–0.7)
EOS PCT: 2 %
HCT: 36 % — ABNORMAL LOW (ref 39.0–52.0)
Hemoglobin: 11.7 g/dL — ABNORMAL LOW (ref 13.0–17.0)
LYMPHS ABS: 1.5 10*3/uL (ref 0.7–4.0)
LYMPHS PCT: 30 %
MCH: 29.6 pg (ref 26.0–34.0)
MCHC: 32.5 g/dL (ref 30.0–36.0)
MCV: 91.1 fL (ref 78.0–100.0)
MONO ABS: 0.6 10*3/uL (ref 0.1–1.0)
MONOS PCT: 12 %
Neutro Abs: 2.8 10*3/uL (ref 1.7–7.7)
Neutrophils Relative %: 56 %
PLATELETS: 262 10*3/uL (ref 150–400)
RBC: 3.95 MIL/uL — AB (ref 4.22–5.81)
RDW: 15.4 % (ref 11.5–15.5)
WBC: 5.1 10*3/uL (ref 4.0–10.5)

## 2015-10-15 MED ORDER — OXYCODONE HCL 5 MG PO TABS: 5.0000 mg | ORAL_TABLET | ORAL | Status: DC | PRN

## 2015-10-15 NOTE — Progress Notes (Signed)
See other encounter.

## 2015-10-16 ENCOUNTER — Ambulatory Visit (HOSPITAL_COMMUNITY): Payer: Medicare Other

## 2015-10-16 MED ORDER — DENOSUMAB 120 MG/1.7ML ~~LOC~~ SOLN
120.0000 mg | Freq: Once | SUBCUTANEOUS | Status: DC
  Filled 2015-10-16: qty 1.7

## 2015-10-16 MED ORDER — CYANOCOBALAMIN 1000 MCG/ML IJ SOLN
INTRAMUSCULAR | Status: AC
Start: 2015-10-16 — End: 2015-10-16
  Filled 2015-10-16: qty 1

## 2015-10-16 MED ORDER — CYANOCOBALAMIN 1000 MCG/ML IJ SOLN: 1000.0000 ug | Freq: Once | INTRAMUSCULAR | Status: AC

## 2015-10-23 MED ORDER — DIPHENHYDRAMINE HCL 25 MG PO CAPS
ORAL_CAPSULE | ORAL | Status: AC
  Filled 2015-10-23: qty 2

## 2015-10-23 MED ORDER — METHYLPREDNISOLONE SODIUM SUCC 125 MG IJ SOLR
INTRAMUSCULAR | Status: AC
  Filled 2015-10-23: qty 2

## 2015-10-23 MED ORDER — ACETAMINOPHEN 325 MG PO TABS
ORAL_TABLET | ORAL | Status: AC
  Filled 2015-10-23: qty 2

## 2015-10-23 MED ORDER — PROCHLORPERAZINE MALEATE 10 MG PO TABS
ORAL_TABLET | ORAL | Status: AC
  Filled 2015-10-23: qty 1

## 2015-11-12 ENCOUNTER — Other Ambulatory Visit (HOSPITAL_COMMUNITY): Payer: Self-pay | Admitting: Oncology

## 2015-11-12 ENCOUNTER — Encounter (HOSPITAL_COMMUNITY): Payer: Medicare Other | Attending: Hematology & Oncology

## 2015-11-12 ENCOUNTER — Encounter (HOSPITAL_COMMUNITY): Payer: Medicare Other

## 2015-11-12 ENCOUNTER — Encounter (HOSPITAL_COMMUNITY): Payer: Self-pay

## 2015-11-12 VITALS — BP 123/86 | HR 70 | Temp 97.4°F | Resp 18

## 2015-11-12 DIAGNOSIS — E538 Deficiency of other specified B group vitamins: Secondary | ICD-10-CM

## 2015-11-12 DIAGNOSIS — C7951 Secondary malignant neoplasm of bone: Secondary | ICD-10-CM

## 2015-11-12 DIAGNOSIS — C61 Malignant neoplasm of prostate: Secondary | ICD-10-CM | POA: Diagnosis not present

## 2015-11-12 DIAGNOSIS — M899 Disorder of bone, unspecified: Secondary | ICD-10-CM | POA: Diagnosis not present

## 2015-11-12 LAB — COMPREHENSIVE METABOLIC PANEL
ALT: 15 U/L — AB (ref 17–63)
AST: 25 U/L (ref 15–41)
Albumin: 4 g/dL (ref 3.5–5.0)
Alkaline Phosphatase: 53 U/L (ref 38–126)
Anion gap: 5 (ref 5–15)
BUN: 21 mg/dL — AB (ref 6–20)
CHLORIDE: 109 mmol/L (ref 101–111)
CO2: 22 mmol/L (ref 22–32)
CREATININE: 1.99 mg/dL — AB (ref 0.61–1.24)
Calcium: 9.1 mg/dL (ref 8.9–10.3)
GFR calc Af Amer: 38 mL/min — ABNORMAL LOW (ref >60)
GFR, EST NON AFRICAN AMERICAN: 33 mL/min — AB (ref >60)
Glucose, Bld: 86 mg/dL (ref 65–99)
Potassium: 4.3 mmol/L (ref 3.5–5.1)
Sodium: 136 mmol/L (ref 135–145)
Total Bilirubin: 0.4 mg/dL (ref 0.3–1.2)
Total Protein: 8.7 g/dL — ABNORMAL HIGH (ref 6.5–8.1)

## 2015-11-12 MED ORDER — OXYCODONE HCL 5 MG PO TABS: 5.0000 mg | ORAL_TABLET | ORAL | Status: DC | PRN

## 2015-11-12 MED ORDER — CYANOCOBALAMIN 1000 MCG/ML IJ SOLN
1000.0000 ug | Freq: Once | INTRAMUSCULAR | Status: AC
  Administered 2015-11-12: 1000 ug via INTRAMUSCULAR
  Filled 2015-11-12: qty 1

## 2015-11-12 MED ORDER — DENOSUMAB 120 MG/1.7ML ~~LOC~~ SOLN
120.0000 mg | Freq: Once | SUBCUTANEOUS | Status: AC
  Administered 2015-11-12: 120 mg via SUBCUTANEOUS
  Filled 2015-11-12: qty 1.7

## 2015-11-12 NOTE — Progress Notes (Signed)
Jeff [name]'s reason for visit today is for an injection and labs as scheduled per MD orders.  Labs were drawn prior to administration of ordered medication.  Jeff [name] also received Delton See and Vit B12 per MD orders; see [facility] for administration details.  Jeff [name] tolerated all procedures well and without incident; questions were answered and patient was discharged.

## 2015-11-12 NOTE — Progress Notes (Signed)
Please see x-geva encounter for more information

## 2015-11-12 NOTE — Patient Instructions (Signed)
[place] at [facility] Discharge Instructions  RECOMMENDATIONS MADE BY THE CONSULTANT AND ANY TEST RESULTS WILL BE SENT TO YOUR REFERRING PHYSICIAN. Pt received Xgeva and Vit B12 injections today. Follow-up as scheduled. Call clinic for any questions or concerns  Thank you for choosing [place] at [facility] to provide your oncology and hematology care.  To afford each patient quality time with our provider, please arrive at least 15 minutes before your scheduled appointment time.   Beginning January 23rd 2017 lab work for the [facility] will be done in the  Main lab at [facility] on 1st floor. If you have a lab appointment with the [facility] please come in thru the  Main Entrance and check in at the main information desk  You need to re-schedule your appointment should you arrive 10 or more minutes late.  We strive to give you quality time with our providers, and arriving late affects you and other patients whose appointments are after yours.  Also, if you no show three or more times for appointments you may be dismissed from the clinic at the providers discretion.     Again, thank you for choosing [facility].  Our hope is that these requests will decrease the amount of time that you wait before being seen by our physicians.       _____________________________________________________________  Should you have questions after your visit to [facility], please contact our office at [phone] between the hours of 8:30 a.m. and 4:30 p.m.  Voicemails left after 4:30 p.m. will not be returned until the following business day.  For prescription refill requests, have your pharmacy contact our office.         Resources For Cancer Patients and their Caregivers ? American Cancer Society: Can assist with transportation, wigs, general needs, runs Look Good Feel Better.        361-320-8474 ? Cancer  Care: Provides financial assistance, online support groups, medication/co-pay assistance.  1-800-813-HOPE (508)080-4787) ? [place] Assists [place] cancer patients and their families through emotional , educational and financial support.  504-848-7268 ? [place] DSS Where to apply for food stamps, Medicaid and utility assistance. (801)808-0279 ? RCATS: Transportation to medical appointments. (956)120-3995 ? Social Security Administration: May apply for disability if have a Stage IV cancer. 7081111793 671-445-3892 ? [employer], Disability and Transit Services: Assists with nutrition, care and transit needs. [place] Support Programs: @10RELATIVEDAYS @ > Cancer Support Group  2nd Tuesday of the month 1pm-2pm, Journey Room  > Creative Journey  3rd Tuesday of the month 1130am-1pm, Journey Room  > Look Good Feel Better  1st Wednesday of the month 10am-12 noon, Journey Room (Call [place] to register [phone])

## 2015-12-10 ENCOUNTER — Other Ambulatory Visit (HOSPITAL_COMMUNITY): Payer: Self-pay

## 2015-12-10 ENCOUNTER — Encounter (HOSPITAL_COMMUNITY): Payer: Medicare Other

## 2015-12-10 ENCOUNTER — Encounter (HOSPITAL_COMMUNITY): Payer: Self-pay | Admitting: Oncology

## 2015-12-10 ENCOUNTER — Encounter (HOSPITAL_COMMUNITY): Payer: Medicare Other | Attending: Oncology | Admitting: Oncology

## 2015-12-10 ENCOUNTER — Encounter (HOSPITAL_BASED_OUTPATIENT_CLINIC_OR_DEPARTMENT_OTHER): Payer: Medicare Other

## 2015-12-10 VITALS — BP 106/63 | HR 95 | Temp 97.6°F | Resp 16 | Wt 161.7 lb

## 2015-12-10 DIAGNOSIS — C61 Malignant neoplasm of prostate: Secondary | ICD-10-CM

## 2015-12-10 DIAGNOSIS — C7951 Secondary malignant neoplasm of bone: Secondary | ICD-10-CM

## 2015-12-10 DIAGNOSIS — D649 Anemia, unspecified: Secondary | ICD-10-CM | POA: Diagnosis not present

## 2015-12-10 DIAGNOSIS — M899 Disorder of bone, unspecified: Secondary | ICD-10-CM | POA: Diagnosis not present

## 2015-12-10 DIAGNOSIS — Z95828 Presence of other vascular implants and grafts: Secondary | ICD-10-CM

## 2015-12-10 DIAGNOSIS — Z452 Encounter for adjustment and management of vascular access device: Secondary | ICD-10-CM

## 2015-12-10 LAB — CBC WITH DIFFERENTIAL/PLATELET
BASOS ABS: 0 10*3/uL (ref 0.0–0.1)
BASOS PCT: 0 %
EOS ABS: 0.1 10*3/uL (ref 0.0–0.7)
Eosinophils Relative: 1 %
HCT: 38.3 % — ABNORMAL LOW (ref 39.0–52.0)
Hemoglobin: 12.4 g/dL — ABNORMAL LOW (ref 13.0–17.0)
Lymphocytes Relative: 30 %
Lymphs Abs: 2 10*3/uL (ref 0.7–4.0)
MCH: 30 pg (ref 26.0–34.0)
MCHC: 32.4 g/dL (ref 30.0–36.0)
MCV: 92.7 fL (ref 78.0–100.0)
MONO ABS: 0.6 10*3/uL (ref 0.1–1.0)
MONOS PCT: 9 %
Neutro Abs: 4 10*3/uL (ref 1.7–7.7)
Neutrophils Relative %: 60 %
PLATELETS: 259 10*3/uL (ref 150–400)
RBC: 4.13 MIL/uL — ABNORMAL LOW (ref 4.22–5.81)
RDW: 16.9 % — AB (ref 11.5–15.5)
WBC: 6.6 10*3/uL (ref 4.0–10.5)

## 2015-12-10 LAB — COMPREHENSIVE METABOLIC PANEL
ALBUMIN: 4.4 g/dL (ref 3.5–5.0)
ALT: 14 U/L — ABNORMAL LOW (ref 17–63)
ANION GAP: 10 (ref 5–15)
AST: 28 U/L (ref 15–41)
Alkaline Phosphatase: 51 U/L (ref 38–126)
BILIRUBIN TOTAL: 0.5 mg/dL (ref 0.3–1.2)
BUN: 24 mg/dL — AB (ref 6–20)
CHLORIDE: 103 mmol/L (ref 101–111)
CO2: 25 mmol/L (ref 22–32)
Calcium: 9.9 mg/dL (ref 8.9–10.3)
Creatinine, Ser: 2.45 mg/dL — ABNORMAL HIGH (ref 0.61–1.24)
GFR calc Af Amer: 30 mL/min — ABNORMAL LOW (ref >60)
GFR calc non Af Amer: 25 mL/min — ABNORMAL LOW (ref >60)
GLUCOSE: 89 mg/dL (ref 65–99)
POTASSIUM: 4.7 mmol/L (ref 3.5–5.1)
SODIUM: 138 mmol/L (ref 135–145)
TOTAL PROTEIN: 9.3 g/dL — AB (ref 6.5–8.1)

## 2015-12-10 LAB — PSA: PSA: 2.26 ng/mL (ref 0.00–4.00)

## 2015-12-10 MED ORDER — SODIUM CHLORIDE 0.9% FLUSH
10.0000 mL | Freq: Once | INTRAVENOUS | Status: AC
  Administered 2015-12-10: 10 mL via INTRAVENOUS

## 2015-12-10 MED ORDER — OXYCODONE HCL 5 MG PO TABS: 5.0000 mg | ORAL_TABLET | ORAL | 0 refills | Status: DC | PRN

## 2015-12-10 MED ORDER — HEPARIN SOD (PORK) LOCK FLUSH 100 UNIT/ML IV SOLN
500.0000 [IU] | Freq: Once | INTRAVENOUS | Status: AC
  Administered 2015-12-10: 500 [IU] via INTRAVENOUS

## 2015-12-10 MED ORDER — CYANOCOBALAMIN 1000 MCG/ML IJ SOLN
1000.0000 ug | Freq: Once | INTRAMUSCULAR | Status: AC
  Administered 2015-12-10: 1000 ug via INTRAMUSCULAR

## 2015-12-10 MED ORDER — DENOSUMAB 120 MG/1.7ML ~~LOC~~ SOLN
120.0000 mg | Freq: Once | SUBCUTANEOUS | Status: AC
  Administered 2015-12-10: 120 mg via SUBCUTANEOUS
  Filled 2015-12-10: qty 1.7

## 2015-12-10 MED ORDER — CYANOCOBALAMIN 1000 MCG/ML IJ SOLN
INTRAMUSCULAR | Status: AC
  Filled 2015-12-10: qty 1

## 2015-12-10 NOTE — Patient Instructions (Signed)
[place] at [facility] Discharge Instructions  RECOMMENDATIONS MADE BY THE CONSULTANT AND ANY TEST RESULTS WILL BE SENT TO YOUR REFERRING PHYSICIAN.  You saw Dr. Oliva Bustard today in place of Dr. Whitney Muse. Drink more fluids, especially when working outside. Follow up in 4 weeks with labs. Continue treatment schedule.  Thank you for choosing [place] at [facility] to provide your oncology and hematology care.  To afford each patient quality time with our provider, please arrive at least 15 minutes before your scheduled appointment time.   Beginning January 23rd 2017 lab work for the [facility] will be done in the  Main lab at [facility] on 1st floor. If you have a lab appointment with the [facility] please come in thru the  Main Entrance and check in at the main information desk  You need to re-schedule your appointment should you arrive 10 or more minutes late.  We strive to give you quality time with our providers, and arriving late affects you and other patients whose appointments are after yours.  Also, if you no show three or more times for appointments you may be dismissed from the clinic at the providers discretion.     Again, thank you for choosing [facility].  Our hope is that these requests will decrease the amount of time that you wait before being seen by our physicians.       _____________________________________________________________  Should you have questions after your visit to [facility], please contact our office at [phone] between the hours of 8:30 a.m. and 4:30 p.m.  Voicemails left after 4:30 p.m. will not be returned until the following business day.  For prescription refill requests, have your pharmacy contact our office.         Resources For Cancer Patients and their Caregivers ? American Cancer Society: Can assist with transportation, wigs, general needs, runs Look Good Feel  Better.        (817) 814-1551 ? Cancer Care: Provides financial assistance, online support groups, medication/co-pay assistance.  1-800-813-HOPE (502)021-3765) ? [place] Assists [place] cancer patients and their families through emotional , educational and financial support.  (276)275-2012 ? [place] DSS Where to apply for food stamps, Medicaid and utility assistance. 469 653 0883 ? RCATS: Transportation to medical appointments. 5712052384 ? Social Security Administration: May apply for disability if have a Stage IV cancer. 615-783-6479 405-886-1204 ? [employer], Disability and Transit Services: Assists with nutrition, care and transit needs. [place] Support Programs: @10RELATIVEDAYS @ > Cancer Support Group  2nd Tuesday of the month 1pm-2pm, Journey Room  > Creative Journey  3rd Tuesday of the month 1130am-1pm, Journey Room  > Look Good Feel Better  1st Wednesday of the month 10am-12 noon, Journey Room (Call [place] to register [phone])

## 2015-12-10 NOTE — Progress Notes (Signed)
Jeff [name] presented for Portacath access and flush. Proper placement of portacath confirmed by CXR. Portacath located right chest wall accessed with  H 20 needle. Good blood return present. Portacath flushed with 82ml NS and 500U/39ml Heparin and needle removed intact. Procedure without incident. Patient tolerated procedure well.  Jeff [name] presents today for injection per MD orders. B12 1000 mcg administered IM in right Upper Arm. Administration without incident. Patient tolerated well.  Jeff [name] presents today for injection per MD orders. Xgeva 120 mg administered SQ in right Abdomen. Administration without incident. Patient tolerated well.

## 2015-12-10 NOTE — Patient Instructions (Signed)
[place] at [facility] Discharge Instructions  RECOMMENDATIONS MADE BY THE CONSULTANT AND ANY TEST RESULTS WILL BE SENT TO YOUR REFERRING PHYSICIAN.  Port flush today. Xgeva injection given as ordered. Vitamin B12 injection given as ordered. Return as scheduled.  Thank you for choosing [place] at [facility] to provide your oncology and hematology care.  To afford each patient quality time with our provider, please arrive at least 15 minutes before your scheduled appointment time.   Beginning January 23rd 2017 lab work for the [facility] will be done in the  Main lab at [facility] on 1st floor. If you have a lab appointment with the [place] please come in thru the  Main Entrance and check in at the main information desk  You need to re-schedule your appointment should you arrive 10 or more minutes late.  We strive to give you quality time with our providers, and arriving late affects you and other patients whose appointments are after yours.  Also, if you no show three or more times for appointments you may be dismissed from the clinic at the providers discretion.     Again, thank you for choosing [facility].  Our hope is that these requests will decrease the amount of time that you wait before being seen by our physicians.       _____________________________________________________________  Should you have questions after your visit to [facility] please contact our office at [phone] between the hours of 8:30 a.m. and 4:30 p.m.  Voicemails left after 4:30 p.m. will not be returned until the following business day.  For prescription refill requests, have your pharmacy contact our office.         Resources For Cancer Patients and their Caregivers ? American Cancer Society: Can assist with transportation, wigs, general needs, runs Look Good Feel Better.        603-559-8532 ? Cancer  Care: Provides financial assistance, online support groups, medication/co-pay assistance.  1-800-813-HOPE 812-257-6915) ? [place] Assists [place] cancer patients and their families through emotional , educational and financial support.  ([phone] ? [place] DSS Where to apply for food stamps, Medicaid and utility assistance. 442-302-6029 ? RCATS: Transportation to medical appointments. [phone] ? Social Security Administration: May apply for disability if have a Stage IV cancer. 810-347-9841 806-391-8751 ? [employer], Disability and Transit Services: Assists with nutrition, care and transit needs. [place] Support Programs: @10RELATIVEDAYS @ > Cancer Support Group  2nd Tuesday of the month 1pm-2pm, Journey Room  > Creative Journey  3rd Tuesday of the month 1130am-1pm, Journey Room  > Look Good Feel Better  1st Wednesday of the month 10am-12 noon, Journey Room (Call [place] to register [phone])

## 2015-12-10 NOTE — Progress Notes (Signed)
See other encounter note.

## 2015-12-10 NOTE — Progress Notes (Signed)
No PCP Per Patient No address on file  Stage IV Adenocarcinoma of the prostate, Hormone Sensitive Disease Bilateral orchiectomy 01/2014 Taxotere/Xgeva started 02/2014 Bilateral nephrostomy tube placement 01/2014 Nephrostomy tube removal 07/23/2014    Prostate cancer metastatic to multiple sites ([place])   01/26/2014 Tumor Marker    PSA > 5000     01/28/2014 Initial Biopsy    Metastatic adenocarcinoma of prostate     02/05/2014 Surgery    Bilateral orchiectomy by Dr. Junious Silk     02/26/2014 Tumor Marker    PSA = 399.5     03/14/2014 Tumor Marker    PSA= 89.51     03/14/2014 - 06/26/2014 Chemotherapy    Docetaxel 75 mg/kg every 21 days     04/24/2014 Tumor Marker    PSA- 19.89     07/23/2014 Procedure    Nephrostomy tube removed by IR, Dr. Geroge Baseman     07/24/2014 Tumor Marker    PSA= 6.15     10/16/2014 Tumor Marker    PSA: 3.54      01/08/2015 Tumor Marker    PSA: 2.13      01/17/2015 Imaging    CT CAP-  Massive pelvic and retroperitoneal lymphadenopathy noted on the prior study has nearly completely resolved, now with only a small amount of residual amorphous soft tissue predominantly around the the infrarenal abdominal aorta.     01/17/2015 Imaging    Bone scan- Widespread osseous metastatic disease with multiple foci of increased activity throughout the skeleton, corresponding with the findings on the prior CT from October, 2015.     04/11/2015 Tumor Marker    PSA: 1.87      07/25/2015 Imaging    CT CAP- Stable matted soft tissue density in the retroperitoneum and extraperitoneal pelvis consistent with treated disease. No recurrent lymphadenopathy.  No pulmonary metastatic disease. Diffuse stable sclerotic metastatic bone disease.        CURRENT THERAPY:Observation. INTERVAL HISTORY: Jeff [name] 70 y.o. male returns for follow-up of his hormone sensitive stage IV prostate cancer. He has completed Taxotere. He is doing remarkably well. He has had a  bilateral orchiectomy.  Jeff [name] is unaccompanied.   He continues to work. He has been eating well with a good appetite. Denies pain, nausea, chest pain, or urinary issues. Admits to hot flashes every once in a while, describing them as terrible "sometimes".   He is doing fairly good.  Denies any bony pain.  No difficulty passing urine.  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 Acute renal failure ([place]) 01/26/2014  . Alcohol abuse   . Anemia of chronic disease 01/26/2014  . Bilateral hydronephrosis 01/26/2014  . History of cocaine abuse 01/26/2014  . Homelessness 01/31/2014  . Prostate cancer metastatic to multiple sites ([place]) 01/30/2014  . Sepsis ([place])   . UTI (lower urinary tract infection)     has Bone lesion; Acute renal failure ([place]); Bilateral hydronephrosis; Anemia of chronic disease; History of cocaine abuse; Prostate cancer metastatic to multiple sites [facility]); Homelessness; Health care maintenance; Sepsis ([place]); UTI (lower urinary tract infection); Malnutrition of moderate degree ([place]); Bacteremia; and Pyrexia on his problem list.      has No Known Allergies.  Jeff [name] had no medications administered during this visit.  SURGICAL HISTORY: Past Surgical History:  Procedure Laterality Date  . ORCHIECTOMY Bilateral 02/05/2014   Procedure: BILATERAL ORCHIECTOMY;  Surgeon: Festus Aloe,  MD;  Location: [facility] ORS;  Service: Urology;  Laterality: Bilateral;  . PERCUTANEOUS NEPHROSTOMY Bilateral    IR Dr. Junious Silk changed on 04/22/2014  . PORTACATH PLACEMENT Right 03/12/14    SOCIAL HISTORY: Social History   Social History  . Marital status: Single    Spouse name: N/A  . Number of children: 1  . Years of education: 9th   Occupational History  . disabilty [employer]   Social History Main Topics  . Smoking status: Current Every Day Smoker    Packs/day: 0.25     Years: 55.00    Types: Cigarettes  . Smokeless tobacco: Never Used  . Alcohol use No     Comment: none for at least 7 -8 years  . Drug use:     Types: Cocaine     Comment: last used Cocaine 01/25/14  . Sexual activity: Yes   Other Topics Concern  . Not on file   Social History Narrative   Lives at [employer] 9th grade   1 son, lives in [place], [place]   Can read and write in native language             FAMILY HISTORY: Family History  Problem Relation Age of Onset  . Cancer Brother 60    Review of Systems  Constitutional: Negative for fever, chills, weight loss and malaise/fatigue.  HENT: Negative for congestion, hearing loss, nosebleeds, sore throat and tinnitus.   Eyes: Negative for blurred vision, double vision, pain and discharge.  Respiratory: Negative for cough, hemoptysis, sputum production, shortness of breath and wheezing.   Cardiovascular: Negative for chest pain, palpitations, claudication, leg swelling and PND.  Gastrointestinal: Negative for heartburn, nausea, vomiting, abdominal pain, diarrhea, constipation, blood in stool and melena.  Genitourinary: Negative for dysuria, urgency, frequency and hematuria.  Musculoskeletal: Positive for  Muscle pain as well as bone pain particularly in the low back area Negative for myalgias, joint pain and falls.  Skin: Negative for itching and rash.  Neurological: Negative for dizziness, tingling, tremors, sensory change, speech change, focal weakness, seizures, loss of consciousness, weakness and headaches.  Endo/Heme/Allergies: Does not bruise/bleed easily.  Psychiatric/Behavioral: Positive for insomnia. Negative for depression, suicidal ideas, memory loss and substance abuse. The patient is not nervous/anxious. Intermittent insomnia    14 point review of systems was performed and is negative except as detailed under history of present illness and above  PHYSICAL EXAMINATION  ECOG PERFORMANCE STATUS: 0 -  Asymptomatic  Vitals:   12/10/15 1305  BP: 106/63  Pulse: 95  Resp: 16  Temp: 97.6 F (36.4 C)    Physical Exam  Constitutional: He is oriented to person, place, and time and well-developed, well-nourished, and in no distress.  HENT:  Head: Normocephalic and atraumatic.  Nose: Nose normal.  Mouth/Throat: Oropharynx is clear and moist. No oropharyngeal exudate.  Poor dentition  Eyes: Conjunctivae and EOM are normal. Pupils are equal, round, and reactive to light. Right eye exhibits no discharge. Left eye exhibits no discharge. No scleral icterus.  Neck: Normal range of motion. Neck supple. No tracheal deviation present. No thyromegaly present.  Cardiovascular: Normal rate, regular rhythm and normal heart sounds.  Exam reveals no gallop and no friction rub.   No murmur heard. Pulmonary/Chest: Effort normal and breath sounds normal. He has no wheezes. He has no rales.  Abdominal: Soft. Bowel sounds are normal. He exhibits no distension and no mass. There is no tenderness. There is no rebound and no  guarding.  Musculoskeletal: Normal range of motion. He exhibits no edema.  Lymphadenopathy:    He has no cervical adenopathy.  Neurological: He is alert and oriented to person, place, and time. He has normal reflexes. No cranial nerve deficit. Gait normal. Coordination normal.  Skin: Skin is warm and dry. No rash noted.  Psychiatric: Mood, memory, affect and judgment normal.  Nursing note and vitals reviewed.   LABORATORY DATA: I have reviewed the labs below.  CBC    Component Value Date/Time   WBC 6.6 12/10/2015 1237   RBC 4.13 (L) 12/10/2015 1237   HGB 12.4 (L) 12/10/2015 1237   HCT 38.3 (L) 12/10/2015 1237   PLT 259 12/10/2015 1237   MCV 92.7 12/10/2015 1237   MCH 30.0 12/10/2015 1237   MCHC 32.4 12/10/2015 1237   RDW 16.9 (H) 12/10/2015 1237   LYMPHSABS 2.0 12/10/2015 1237   MONOABS 0.6 12/10/2015 1237   EOSABS 0.1 12/10/2015 1237   BASOSABS 0.0 12/10/2015 1237    CMP  Results for ROEY, COOPMAN (MRN [id number]) as of 12/10/2015 13:27  Ref. Range 12/10/2015 12:37  Sodium Latest Ref Range: 135 - 145 mmol/L 138  Potassium Latest Ref Range: 3.5 - 5.1 mmol/L 4.7  Chloride Latest Ref Range: 101 - 111 mmol/L 103  CO2 Latest Ref Range: 22 - 32 mmol/L 25  BUN Latest Ref Range: 6 - 20 mg/dL 24 (H)  Creatinine Latest Ref Range: 0.61 - 1.24 mg/dL 2.45 (H)  Calcium Latest Ref Range: 8.9 - 10.3 mg/dL 9.9  EGFR (Non-African Amer.) Latest Ref Range: >60 mL/min 25 (L)  EGFR (African American) Latest Ref Range: >60 mL/min 30 (L)  Glucose Latest Ref Range: 65 - 99 mg/dL 89  Anion gap Latest Ref Range: 5 - 15  10  Alkaline Phosphatase Latest Ref Range: 38 - 126 U/L 51  Albumin Latest Ref Range: 3.5 - 5.0 g/dL 4.4  AST Latest Ref Range: 15 - 41 U/L 28  ALT Latest Ref Range: 17 - 63 U/L 14 (L)  Total Protein Latest Ref Range: 6.5 - 8.1 g/dL 9.3 (H)  Total Bilirubin Latest Ref Range: 0.3 - 1.2 mg/dL 0.5   RADIOLOGY: I have personally reviewed the radiological images as listed and agreed with the findings in the report. Study Result     CLINICAL DATA: Restaging metastatic prostate cancer.  EXAM: CT CHEST, ABDOMEN, AND PELVIS WITH CONTRAST  TECHNIQUE: Multidetector CT imaging of the chest, abdomen and pelvis was performed following the standard protocol during bolus administration of intravenous contrast.  CONTRAST: 128m OMNIPAQUE IOHEXOL 300 MG/ML SOLN  COMPARISON: 01/17/2015  FINDINGS: CT CHEST FINDINGS  Mediastinum/Lymph Nodes: No chest wall mass, supraclavicular or axillary lymphadenopathy. The thyroid gland is grossly normal. The right Port-A-Cath is stable.  The heart is borderline enlarged but stable. No pericardial effusion. Stable three-vessel coronary artery calcifications. The aorta is normal in caliber. No dissection. No significant atherosclerotic calcifications.  No mediastinal or hilar mass or adenopathy. A few small  scattered lymph nodes are stable.  The esophagus is grossly normal. Stable moderate-sized hiatal hernia.  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 No pulmonary metastatic disease. No pleural effusion.  Musculoskeletal: Diffuse sclerotic osseous metastatic disease but no interval change when compared to the prior examination. No new lesions or pathologic fracture.  CT ABDOMEN PELVIS FINDINGS  Hepatobiliary: No focal hepatic lesions or intrahepatic biliary dilatation. The gallbladder is normal. No common bile duct dilatation.  Pancreas: Nodal mass, inflammation or ductal dilatation.  Spleen: Normal size. No focal lesions.  Adrenals/Urinary Tract: The adrenal glands and right kidney are unremarkable. The left kidney is small and scarred.  Stomach/Bowel: The stomach, duodenum, small bowel and colon are grossly normal. No inflammatory changes, mass lesions or obstructive findings. Moderate stool throughout the colon may suggest constipation. The terminal ileum is normal. The appendix is normal.  Vascular/Lymphatic: Stable matted soft tissue density in the retroperitoneum surrounding the aorta consistent with treated disease. No recurrent lymphadenopathy. Similar findings in the pelvis with matted soft tissue density in the left pelvic sidewall area but no recurrent disease. No inguinal adenopathy.  Stable atherosclerotic calcifications involving the aorta. The branch vessels are patent. The major venous structures are patent.  Reproductive: The bladder, prostate gland and seminal vesicles are stable.  Other: No abdominal wall hernia or subcutaneous lesions.  Musculoskeletal: Stable diffuse sclerotic osseous metastatic disease.  IMPRESSION: 1. Stable matted soft tissue density in the retroperitoneum and extraperitoneal pelvis consistent with treated disease. No recurrent lymphadenopathy. 2. Stable  emphysematous changes and interstitial lung disease. No pulmonary metastatic disease. 3. Diffuse stable sclerotic metastatic bone disease.   Electronically Signed  By: Marijo Sanes M.D.  On: 07/25/2015 11:01     ASSESSMENT and THERAPY PLAN:   Stage IV prostate cancer, hormone sensitive disease. Bilateral nephrostomy tubes Bilateral orchiectomy History of polysubstance abuse  Anemia, multifactorial  Tobacco Use   70 year old male with stage IV adenocarcinoma of the prostate, hormone sensitive disease.  All lab data has been reviewed. PSA is pending Serum creatinine is slightly elevated but patient was out working today and mildly dehydrated EGFR is still 30 We will continue XGEVA with carefully monitoring renal function.  Patient was advised to drink a lot of fluid If serum creatinine continues to go up then further evaluation with ultrasound to rule out any hydronephrosis and later on XGEVA can be discontinued. Reevaluate patient in 4 weeks particularly because of some rising serum creatinine,  Continue vitamin B12 injection 's and continues to take calcium pill. Serum calcium is 9.9 today  Forest Gleason, MD  12/10/2015

## 2016-01-07 ENCOUNTER — Encounter (HOSPITAL_BASED_OUTPATIENT_CLINIC_OR_DEPARTMENT_OTHER): Payer: Medicare Other

## 2016-01-07 ENCOUNTER — Encounter (HOSPITAL_COMMUNITY): Payer: Self-pay | Admitting: Hematology & Oncology

## 2016-01-07 ENCOUNTER — Encounter (HOSPITAL_COMMUNITY): Payer: Medicare Other

## 2016-01-07 ENCOUNTER — Encounter (HOSPITAL_COMMUNITY): Payer: Medicare Other | Attending: Oncology | Admitting: Hematology & Oncology

## 2016-01-07 VITALS — BP 125/72 | HR 84 | Temp 98.0°F | Resp 16 | Wt 172.0 lb

## 2016-01-07 DIAGNOSIS — M899 Disorder of bone, unspecified: Secondary | ICD-10-CM

## 2016-01-07 DIAGNOSIS — Z720 Tobacco use: Secondary | ICD-10-CM

## 2016-01-07 DIAGNOSIS — C61 Malignant neoplasm of prostate: Secondary | ICD-10-CM

## 2016-01-07 DIAGNOSIS — C7951 Secondary malignant neoplasm of bone: Secondary | ICD-10-CM | POA: Diagnosis not present

## 2016-01-07 LAB — CBC WITH DIFFERENTIAL/PLATELET
BASOS ABS: 0 10*3/uL (ref 0.0–0.1)
BASOS PCT: 0 %
EOS ABS: 0.1 10*3/uL (ref 0.0–0.7)
Eosinophils Relative: 2 %
HCT: 35 % — ABNORMAL LOW (ref 39.0–52.0)
HEMOGLOBIN: 11.4 g/dL — AB (ref 13.0–17.0)
Lymphocytes Relative: 40 %
Lymphs Abs: 2.3 10*3/uL (ref 0.7–4.0)
MCH: 30.3 pg (ref 26.0–34.0)
MCHC: 32.6 g/dL (ref 30.0–36.0)
MCV: 93.1 fL (ref 78.0–100.0)
MONOS PCT: 9 %
Monocytes Absolute: 0.5 10*3/uL (ref 0.1–1.0)
NEUTROS ABS: 2.8 10*3/uL (ref 1.7–7.7)
NEUTROS PCT: 49 %
Platelets: 253 10*3/uL (ref 150–400)
RBC: 3.76 MIL/uL — ABNORMAL LOW (ref 4.22–5.81)
RDW: 15.8 % — ABNORMAL HIGH (ref 11.5–15.5)
WBC: 5.7 10*3/uL (ref 4.0–10.5)

## 2016-01-07 LAB — COMPREHENSIVE METABOLIC PANEL
ALBUMIN: 3.9 g/dL (ref 3.5–5.0)
ALK PHOS: 46 U/L (ref 38–126)
ALT: 13 U/L — ABNORMAL LOW (ref 17–63)
ANION GAP: 6 (ref 5–15)
AST: 25 U/L (ref 15–41)
BUN: 17 mg/dL (ref 6–20)
CALCIUM: 9.1 mg/dL (ref 8.9–10.3)
CO2: 25 mmol/L (ref 22–32)
Chloride: 105 mmol/L (ref 101–111)
Creatinine, Ser: 1.96 mg/dL — ABNORMAL HIGH (ref 0.61–1.24)
GFR calc Af Amer: 39 mL/min — ABNORMAL LOW (ref >60)
GFR calc non Af Amer: 33 mL/min — ABNORMAL LOW (ref >60)
GLUCOSE: 97 mg/dL (ref 65–99)
POTASSIUM: 4 mmol/L (ref 3.5–5.1)
SODIUM: 136 mmol/L (ref 135–145)
Total Bilirubin: 0.2 mg/dL — ABNORMAL LOW (ref 0.3–1.2)
Total Protein: 8.4 g/dL — ABNORMAL HIGH (ref 6.5–8.1)

## 2016-01-07 LAB — PSA: PSA: 2.09 ng/mL (ref 0.00–4.00)

## 2016-01-07 MED ORDER — OXYCODONE HCL 5 MG PO TABS: 5.0000 mg | ORAL_TABLET | ORAL | 0 refills | Status: DC | PRN

## 2016-01-07 MED ORDER — CYANOCOBALAMIN 1000 MCG/ML IJ SOLN
INTRAMUSCULAR | Status: AC
  Filled 2016-01-07: qty 1

## 2016-01-07 MED ORDER — CYANOCOBALAMIN 1000 MCG/ML IJ SOLN
1000.0000 ug | Freq: Once | INTRAMUSCULAR | Status: AC
  Administered 2016-01-07: 1000 ug via INTRAMUSCULAR

## 2016-01-07 MED ORDER — DENOSUMAB 120 MG/1.7ML ~~LOC~~ SOLN
120.0000 mg | Freq: Once | SUBCUTANEOUS | Status: AC
  Administered 2016-01-07: 120 mg via SUBCUTANEOUS
  Filled 2016-01-07: qty 1.7

## 2016-01-07 NOTE — Progress Notes (Signed)
Pt here today for B12 and Xgeva injections. Pt B12 in left deltoid and Xgeva in right lower abdomen. Pt tolerated well. Pt stable and discharged home. Pt to return as scheduled.

## 2016-01-07 NOTE — Patient Instructions (Addendum)
[place] at [facility] Discharge Instructions  RECOMMENDATIONS MADE BY THE CONSULTANT AND ANY TEST RESULTS WILL BE SENT TO YOUR REFERRING PHYSICIAN.  You saw Dr.Penland today. You will be scheduled for CT scan and Bone Scan. We will call you with results. Follow up with Tom in 3 months with lab work.  Thank you for choosing [place] at [facility] to provide your oncology and hematology care.  To afford each patient quality time with our provider, please arrive at least 15 minutes before your scheduled appointment time.   Beginning January 23rd 2017 lab work for the [facility] will be done in the  Main lab at [facility] on 1st floor. If you have a lab appointment with the [place] please come in thru the  Main Entrance and check in at the main information desk  You need to re-schedule your appointment should you arrive 10 or more minutes late.  We strive to give you quality time with our providers, and arriving late affects you and other patients whose appointments are after yours.  Also, if you no show three or more times for appointments you may be dismissed from the clinic at the providers discretion.     Again, thank you for choosing [facility].  Our hope is that these requests will decrease the amount of time that you wait before being seen by our physicians.       _____________________________________________________________  Should you have questions after your visit to [facility], please contact our office at [phone] between the hours of 8:30 a.m. and 4:30 p.m.  Voicemails left after 4:30 p.m. will not be returned until the following business day.  For prescription refill requests, have your pharmacy contact our office.         Resources For Cancer Patients and their Caregivers ? American Cancer Society: Can assist with transportation, wigs, general needs, runs Look Good Feel Better.         [phone] ? Cancer Care: Provides financial assistance, online support groups, medication/co-pay assistance.  1-800-813-HOPE 639-643-3718) ? [place] Assists [place] cancer patients and their families through emotional , educational and financial support.  605-611-6769 ? [place] DSS Where to apply for food stamps, Medicaid and utility assistance. ([phone] ? RCATS: Transportation to medical appointments. 925-140-9864 ? Social Security Administration: May apply for disability if have a Stage IV cancer. 856-252-0057 209-247-2139 ? [employer], Disability and Transit Services: Assists with nutrition, care and transit needs. [place] Support Programs: @10RELATIVEDAYS @ > Cancer Support Group  2nd Tuesday of the month 1pm-2pm, Journey Room  > Creative Journey  3rd Tuesday of the month 1130am-1pm, Journey Room  > Look Good Feel Better  1st Wednesday of the month 10am-12 noon, Journey Room (Call [place] to register [phone])

## 2016-01-07 NOTE — Progress Notes (Signed)
No PCP Per Patient [place] [id number]  Stage IV Adenocarcinoma of the prostate, Hormone Sensitive Disease Bilateral orchiectomy 01/2014 Taxotere/Xgeva started 02/2014 Bilateral nephrostomy tube placement 01/2014 Nephrostomy tube removal 07/23/2014    Prostate cancer metastatic to multiple sites ([place])   01/26/2014 Tumor Marker    PSA > 5000      01/28/2014 Initial Biopsy    Metastatic adenocarcinoma of prostate      02/05/2014 Surgery    Bilateral orchiectomy by Dr. Junious Silk      02/26/2014 Tumor Marker    PSA = 399.5      03/14/2014 Tumor Marker    PSA= 89.51      03/14/2014 - 06/26/2014 Chemotherapy    Docetaxel 75 mg/kg every 21 days      04/24/2014 Tumor Marker    PSA- 19.89      07/23/2014 Procedure    Nephrostomy tube removed by IR, Dr. Geroge Baseman      07/24/2014 Tumor Marker    PSA= 6.15      10/16/2014 Tumor Marker    PSA: 3.54       01/08/2015 Tumor Marker    PSA: 2.13       01/17/2015 Imaging    CT CAP-  Massive pelvic and retroperitoneal lymphadenopathy noted on the prior study has nearly completely resolved, now with only a small amount of residual amorphous soft tissue predominantly around the the infrarenal abdominal aorta.      01/17/2015 Imaging    Bone scan- Widespread osseous metastatic disease with multiple foci of increased activity throughout the skeleton, corresponding with the findings on the prior CT from October, 2015.      04/11/2015 Tumor Marker    PSA: 1.87       07/25/2015 Imaging    CT CAP- Stable matted soft tissue density in the retroperitoneum and extraperitoneal pelvis consistent with treated disease. No recurrent lymphadenopathy.  No pulmonary metastatic disease. Diffuse stable sclerotic metastatic bone disease.        CURRENT THERAPY:Observation. INTERVAL HISTORY: Jeff [name] 70 y.o. male returns for follow-up of his hormone sensitive stage IV prostate cancer. He has completed Taxotere.  He is doing remarkably well. He has had a bilateral orchiectomy.  Jeff [name] is unaccompanied.  He is eating well. He denies any urinary issues. He denies any new pain. He has chronic back pain.  He is sleeping better.   He works nearly every day and would prefer to be scheduled for scans later in the evening. He is involved in landscaping and notes that he is not limited in his capacity to do work. He denies CP or SOB.  MEDICAL HISTORY: Past Medical History:  Diagnosis Date  . Acute renal failure ([place]) 01/26/2014  . Alcohol abuse   . Anemia of chronic disease 01/26/2014  . Bilateral hydronephrosis 01/26/2014  . History of cocaine abuse 01/26/2014  . Homelessness 01/31/2014  . Prostate cancer metastatic to multiple sites ([place]) 01/30/2014  . Sepsis ([place])   . UTI (lower urinary tract infection)     has Bone lesion; Acute renal failure ([place]); Bilateral hydronephrosis; Anemia of chronic disease; History of cocaine abuse; Prostate cancer metastatic to multiple sites [facility]); Homelessness; Health care maintenance; Sepsis ([place]); UTI (lower urinary tract infection); Malnutrition of moderate degree ([place]); Bacteremia; and Pyrexia on his problem list.      has No Known Allergies.  Jeff [name] does not currently have medications on file.  SURGICAL HISTORY: Past Surgical History:  Procedure Laterality Date  . ORCHIECTOMY Bilateral 02/05/2014   Procedure: BILATERAL ORCHIECTOMY;  Surgeon: Festus Aloe, MD;  Location: [facility];  Service: Urology;  Laterality: Bilateral;  . PERCUTANEOUS NEPHROSTOMY Bilateral    IR Dr. Junious Silk changed on 04/22/2014  . PORTACATH PLACEMENT Right 03/12/14    SOCIAL HISTORY: Social History   Social History  . Marital status: Single    Spouse name: N/A  . Number of children: 1  . Years of education: 9th   Occupational History  . disabilty [employer]   Social History Main Topics  . Smoking status: Current Every Day Smoker    Packs/day: 0.25     Years: 55.00    Types: Cigarettes  . Smokeless tobacco: Never Used  . Alcohol use No     Comment: none for at least 7 -8 years  . Drug use:     Types: Cocaine     Comment: last used Cocaine 01/25/14  . Sexual activity: Yes   Other Topics Concern  . Not on file   Social History Narrative   Lives at [employer] 9th grade   1 son, lives in [place], [place]   Can read and write in native language             FAMILY HISTORY: Family History  Problem Relation Age of Onset  . Cancer Brother 60    Review of Systems  Constitutional: Negative for fever, chills, weight loss and malaise/fatigue.  HENT: Negative for congestion, hearing loss, nosebleeds, sore throat and tinnitus.   Eyes: Negative for blurred vision, double vision, pain and discharge.  Respiratory: Negative for cough, hemoptysis, sputum production, shortness of breath and wheezing.   Cardiovascular: Negative for chest pain, palpitations, claudication, leg swelling and PND.  Gastrointestinal: Negative for heartburn, nausea, vomiting, abdominal pain, diarrhea, constipation, blood in stool and melena.  Genitourinary: Negative for dysuria, urgency, frequency and hematuria.  Musculoskeletal: Positive for back pain. Negative for myalgias, joint pain and falls.  Skin: Negative for itching and rash.  Neurological: Negative for dizziness, tingling, tremors, sensory change, speech change, focal weakness, seizures, loss of consciousness, weakness and headaches.  Endo/Heme/Allergies: Does not bruise/bleed easily.  Psychiatric/Behavioral:  Negative for depression, suicidal ideas, memory loss and substance abuse. The patient is not nervous/anxious. Intermittent insomnia   14 point review of systems was performed and is negative except as detailed under history of present illness and above  PHYSICAL EXAMINATION  ECOG PERFORMANCE STATUS: 0 - Asymptomatic  Vitals:   01/07/16 1347  BP: 125/72  Pulse: 84  Resp: 16    Temp: 98 F (36.7 C)    Physical Exam  Constitutional: He is oriented to person, place, and time and well-developed, well-nourished, and in no distress.  HENT:  Head: Normocephalic and atraumatic.  Nose: Nose normal.  Mouth/Throat: Oropharynx is clear and moist. No oropharyngeal exudate.  Poor dentition  Eyes: Conjunctivae and EOM are normal. Pupils are equal, round, and reactive to light. Right eye exhibits no discharge. Left eye exhibits no discharge. No scleral icterus.  Neck: Normal range of motion. Neck supple. No tracheal deviation present. No thyromegaly present.  Cardiovascular: Normal rate, regular rhythm and normal heart sounds.  Exam reveals no gallop and no friction rub.   No murmur heard. Pulmonary/Chest: Effort normal and breath sounds normal. He has no wheezes. He has no rales.  Abdominal: Soft. Bowel sounds are normal. He exhibits no distension and no mass. There is no tenderness. There is no  rebound and no guarding.  Musculoskeletal: Normal range of motion. He exhibits no edema.  Lymphadenopathy:    He has no cervical adenopathy.  Neurological: He is alert and oriented to person, place, and time. He has normal reflexes. No cranial nerve deficit. Gait normal. Coordination normal.  Skin: Skin is warm and dry. No rash noted.  Psychiatric: Mood, memory, affect and judgment normal.  Nursing note and vitals reviewed.   LABORATORY DATA: I have reviewed the labs below.  CBC    Component Value Date/Time   WBC 5.7 01/07/2016 1336   RBC 3.76 (L) 01/07/2016 1336   HGB 11.4 (L) 01/07/2016 1336   HCT 35.0 (L) 01/07/2016 1336   PLT 253 01/07/2016 1336   MCV 93.1 01/07/2016 1336   MCH 30.3 01/07/2016 1336   MCHC 32.6 01/07/2016 1336   RDW 15.8 (H) 01/07/2016 1336   LYMPHSABS 2.3 01/07/2016 1336   MONOABS 0.5 01/07/2016 1336   EOSABS 0.1 01/07/2016 1336   BASOSABS 0.0 01/07/2016 1336   CMP     Component Value Date/Time   NA 136 01/07/2016 1336   K 4.0 01/07/2016  1336   CL 105 01/07/2016 1336   CO2 25 01/07/2016 1336   GLUCOSE 97 01/07/2016 1336   BUN 17 01/07/2016 1336   CREATININE 1.96 (H) 01/07/2016 1336   CREATININE 1.39 (H) 02/06/2014 1012   CALCIUM 9.1 01/07/2016 1336   PROT 8.4 (H) 01/07/2016 1336   ALBUMIN 3.9 01/07/2016 1336   AST 25 01/07/2016 1336   ALT 13 (L) 01/07/2016 1336   ALKPHOS 46 01/07/2016 1336   BILITOT 0.2 (L) 01/07/2016 1336   GFRNONAA 33 (L) 01/07/2016 1336   GFRAA 39 (L) 01/07/2016 1336   Results for YONY, PINYAN (MRN [id number])   Ref. Range 01/08/2015 11:06 04/02/2015 09:13 06/25/2015 14:05 07/23/2015 09:26 09/17/2015 10:30 12/10/2015 12:37  PSA Latest Ref Range: 0.00 - 4.00 ng/mL 2.13 1.87 1.54 1.52 1.90 2.26    RADIOLOGY: I have personally reviewed the radiological images as listed and agreed with the findings in the report. Study Result     CLINICAL DATA: Restaging metastatic prostate cancer.  EXAM: CT CHEST, ABDOMEN, AND PELVIS WITH CONTRAST  TECHNIQUE: Multidetector CT imaging of the chest, abdomen and pelvis was performed following the standard protocol during bolus administration of intravenous contrast.  CONTRAST: 133mL OMNIPAQUE IOHEXOL 300 MG/ML SOLN  COMPARISON: 01/17/2015  FINDINGS: CT CHEST FINDINGS  Mediastinum/Lymph Nodes: No chest wall mass, supraclavicular or axillary lymphadenopathy. The thyroid gland is grossly normal. The right Port-A-Cath is stable.  The heart is borderline enlarged but stable. No pericardial effusion. Stable three-vessel coronary artery calcifications. The aorta is normal in caliber. No dissection. No significant atherosclerotic calcifications.  No mediastinal or hilar mass or adenopathy. A few small scattered lymph nodes are stable.  The esophagus is grossly normal. Stable moderate-sized hiatal hernia.  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 No pulmonary metastatic disease. No  pleural effusion.  Musculoskeletal: Diffuse sclerotic osseous metastatic disease but no interval change when compared to the prior examination. No new lesions or pathologic fracture.  CT ABDOMEN PELVIS FINDINGS  Hepatobiliary: No focal hepatic lesions or intrahepatic biliary dilatation. The gallbladder is normal. No common bile duct dilatation.  Pancreas: Nodal mass, inflammation or ductal dilatation.  Spleen: Normal size. No focal lesions.  Adrenals/Urinary Tract: The adrenal glands and right kidney are unremarkable. The left kidney is small and scarred.  Stomach/Bowel: The stomach, duodenum, small bowel and colon are grossly normal. No  inflammatory changes, mass lesions or obstructive findings. Moderate stool throughout the colon may suggest constipation. The terminal ileum is normal. The appendix is normal.  Vascular/Lymphatic: Stable matted soft tissue density in the retroperitoneum surrounding the aorta consistent with treated disease. No recurrent lymphadenopathy. Similar findings in the pelvis with matted soft tissue density in the left pelvic sidewall area but no recurrent disease. No inguinal adenopathy.  Stable atherosclerotic calcifications involving the aorta. The branch vessels are patent. The major venous structures are patent.  Reproductive: The bladder, prostate gland and seminal vesicles are stable.  Other: No abdominal wall hernia or subcutaneous lesions.  Musculoskeletal: Stable diffuse sclerotic osseous metastatic disease.  IMPRESSION: 1. Stable matted soft tissue density in the retroperitoneum and extraperitoneal pelvis consistent with treated disease. No recurrent lymphadenopathy. 2. Stable emphysematous changes and interstitial lung disease. No pulmonary metastatic disease. 3. Diffuse stable sclerotic metastatic bone disease.   Electronically Signed  By: Marijo Sanes M.D.  On: 07/25/2015 11:01     ASSESSMENT and  THERAPY PLAN:   Stage IV prostate cancer, hormone sensitive disease. Bilateral nephrostomy tubes Bilateral orchiectomy History of polysubstance abuse  Anemia, multifactorial  Tobacco Use   70 year old male with stage IV adenocarcinoma of the prostate, hormone sensitive disease.  He had a bilateral orchiectomy. He continues on XGEVA monthly for bone metastases. He was again advised to take calcium and vitamin D daily.   PSA has risen and I have recommended imaging including bone scan and CT C/A/P. He is agreeable. We will call with results and bring him in for discussion if needed. If scans show progression will discuss XTANDI vs. ZYTIGA. If stable  will also discuss additional therapy but will wait until next visit and PSA.   I will tentatively plan on seeing him again in 3 months with repeat labs including a PSA. He will continue with monthly XGEVA.  I have refilled calcium and pain medication, at the patient's request.  Smoking cessation was addressed again with the patient. We discussed methods of quitting, he will think on this. Will continue to address at each follow-up.   Orders Placed This Encounter  Procedures  . CT Chest W Contrast    Standing Status:   Future    Standing Expiration Date:   01/06/2017    Order Specific Question:   If indicated for the ordered procedure, I authorize the administration of contrast media per Radiology protocol    Answer:   Yes    Order Specific Question:   Reason for Exam (SYMPTOM  OR DIAGNOSIS REQUIRED)    Answer:   restaging stage IV prostate cancer rising PSa    Order Specific Question:   Preferred imaging location?    Answer:   [facility]  . CT Abdomen Pelvis W Contrast    Standing Status:   Future    Standing Expiration Date:   01/06/2017    Order Specific Question:   If indicated for the ordered procedure, I authorize the administration of contrast media per Radiology protocol    Answer:   Yes    Order Specific Question:    Reason for Exam (SYMPTOM  OR DIAGNOSIS REQUIRED)    Answer:   restaging stage IV prostate cancer rising PSa    Order Specific Question:   Preferred imaging location?    Answer:   [facility]    Standing Status:   Future    Number of Occurrences:   1  Standing Expiration Date:   01/06/2017    Order Specific Question:   Reason for Exam (SYMPTOM  OR DIAGNOSIS REQUIRED)    Answer:   stage IV prostate cancer rising PSA    Order Specific Question:   Preferred imaging location?    Answer:   [facility]    Order Specific Question:   If indicated for the ordered procedure, I authorize the administration of a radiopharmaceutical per Radiology protocol    Answer:   Yes  . CBC with Differential    Standing Status:   Future    Standing Expiration Date:   01/06/2017  . Comprehensive metabolic panel    Standing Status:   Future    Standing Expiration Date:   01/06/2017  . PSA    Standing Status:   Future    Standing Expiration Date:   01/06/2017     All questions were answered. The patient knows to call the clinic with any problems, questions or concerns. We can certainly see the patient much sooner if necessary.  This document serves as a record of services personally performed by Ancil Linsey, MD. It was created on her behalf by Arlyce Harman, a trained medical scribe. The creation of this record is based on the scribe's personal observations and the provider's statements to them. This document has been checked and approved by the attending provider.  I have reviewed the above documentation for accuracy and completeness, and I agree with the above.  Molli Hazard, MD  01/07/2016

## 2016-01-07 NOTE — Patient Instructions (Signed)
[place] at [facility] Discharge Instructions  RECOMMENDATIONS MADE BY THE CONSULTANT AND ANY TEST RESULTS WILL BE SENT TO YOUR REFERRING PHYSICIAN.  You were given a B12 injection and a Xgeva injection today.  Return as scheduled.   Thank you for choosing [place] at [facility] to provide your oncology and hematology care.  To afford each patient quality time with our provider, please arrive at least 15 minutes before your scheduled appointment time.   Beginning January 23rd 2017 lab work for the [facility] will be done in the  Main lab at [facility] on 1st floor. If you have a lab appointment with the [facility] please come in thru the  Main Entrance and check in at the main information desk  You need to re-schedule your appointment should you arrive 10 or more minutes late.  We strive to give you quality time with our providers, and arriving late affects you and other patients whose appointments are after yours.  Also, if you no show three or more times for appointments you may be dismissed from the clinic at the providers discretion.     Again, thank you for choosing [facility].  Our hope is that these requests will decrease the amount of time that you wait before being seen by our physicians.       _____________________________________________________________  Should you have questions after your visit to [facility], please contact our office at [phone] between the hours of 8:30 a.m. and 4:30 p.m.  Voicemails left after 4:30 p.m. will not be returned until the following business day.  For prescription refill requests, have your pharmacy contact our office.         Resources For Cancer Patients and their Caregivers ? American Cancer Society: Can assist with transportation, wigs, general needs, runs Look Good Feel Better.        (219)725-4432 ? Cancer Care: Provides financial assistance,  online support groups, medication/co-pay assistance.  1-800-813-HOPE 9058340240) ? [place] Assists [place] cancer patients and their families through emotional , educational and financial support.  (939)154-5022 ? [place] DSS Where to apply for food stamps, Medicaid and utility assistance. (815)544-3486 ? RCATS: Transportation to medical appointments. 5872083965 ? Social Security Administration: May apply for disability if have a Stage IV cancer. 407-583-9294 (216)641-9077 ? [employer], Disability and Transit Services: Assists with nutrition, care and transit needs. [place] Support Programs: @10RELATIVEDAYS @ > Cancer Support Group  2nd Tuesday of the month 1pm-2pm, Journey Room  > Creative Journey  3rd Tuesday of the month 1130am-1pm, Journey Room  > Look Good Feel Better  1st Wednesday of the month 10am-12 noon, Journey Room (Call [place] to register [phone])

## 2016-01-14 ENCOUNTER — Encounter (HOSPITAL_COMMUNITY)
Admission: RE | Admit: 2016-01-14 | Discharge: 2016-01-14 | Disposition: A | Discharge status: Home / Self Care | Payer: Medicare Other | Source: Ambulatory Visit | Attending: Hematology & Oncology | Admitting: Hematology & Oncology

## 2016-01-14 ENCOUNTER — Encounter (HOSPITAL_COMMUNITY): Payer: Self-pay

## 2016-01-14 DIAGNOSIS — K869 Disease of pancreas, unspecified: Secondary | ICD-10-CM | POA: Diagnosis not present

## 2016-01-14 DIAGNOSIS — I700 Atherosclerosis of aorta: Secondary | ICD-10-CM | POA: Diagnosis not present

## 2016-01-14 DIAGNOSIS — C61 Malignant neoplasm of prostate: Secondary | ICD-10-CM | POA: Diagnosis not present

## 2016-01-14 DIAGNOSIS — R948 Abnormal results of function studies of other organs and systems: Secondary | ICD-10-CM | POA: Diagnosis not present

## 2016-01-14 DIAGNOSIS — I2510 Atherosclerotic heart disease of native coronary artery without angina pectoris: Secondary | ICD-10-CM | POA: Diagnosis not present

## 2016-01-14 DIAGNOSIS — J849 Interstitial pulmonary disease, unspecified: Secondary | ICD-10-CM | POA: Diagnosis not present

## 2016-01-14 DIAGNOSIS — C7951 Secondary malignant neoplasm of bone: Secondary | ICD-10-CM | POA: Diagnosis not present

## 2016-01-14 MED ORDER — TECHNETIUM TC 99M MEDRONATE IV KIT
20.0000 | PACK | Freq: Once | INTRAVENOUS | Status: AC | PRN
  Administered 2016-01-14: 19 via INTRAVENOUS

## 2016-01-18 ENCOUNTER — Encounter (HOSPITAL_COMMUNITY): Payer: Self-pay | Admitting: Hematology & Oncology

## 2016-01-22 ENCOUNTER — Other Ambulatory Visit (HOSPITAL_COMMUNITY): Payer: Self-pay | Admitting: Oncology

## 2016-01-22 ENCOUNTER — Ambulatory Visit (HOSPITAL_COMMUNITY)
Admission: RE | Admit: 2016-01-22 | Discharge: 2016-01-22 | Disposition: A | Discharge status: Home / Self Care | Payer: Medicare Other | Source: Ambulatory Visit | Attending: Hematology & Oncology | Admitting: Hematology & Oncology

## 2016-01-22 DIAGNOSIS — K869 Disease of pancreas, unspecified: Secondary | ICD-10-CM | POA: Diagnosis not present

## 2016-01-22 DIAGNOSIS — C61 Malignant neoplasm of prostate: Secondary | ICD-10-CM

## 2016-01-22 DIAGNOSIS — R948 Abnormal results of function studies of other organs and systems: Secondary | ICD-10-CM | POA: Insufficient documentation

## 2016-01-22 DIAGNOSIS — J849 Interstitial pulmonary disease, unspecified: Secondary | ICD-10-CM | POA: Insufficient documentation

## 2016-01-22 DIAGNOSIS — I2510 Atherosclerotic heart disease of native coronary artery without angina pectoris: Secondary | ICD-10-CM | POA: Insufficient documentation

## 2016-01-22 DIAGNOSIS — C7951 Secondary malignant neoplasm of bone: Secondary | ICD-10-CM | POA: Insufficient documentation

## 2016-01-22 DIAGNOSIS — I700 Atherosclerosis of aorta: Secondary | ICD-10-CM | POA: Diagnosis not present

## 2016-01-22 DIAGNOSIS — C419 Malignant neoplasm of bone and articular cartilage, unspecified: Secondary | ICD-10-CM | POA: Diagnosis not present

## 2016-01-22 MED ORDER — IOPAMIDOL (ISOVUE-300) INJECTION 61%
100.0000 mL | Freq: Once | INTRAVENOUS | Status: AC | PRN
  Administered 2016-01-22: 80 mL via INTRAVENOUS

## 2016-01-22 MED ORDER — OXYCODONE HCL 5 MG PO TABS: 5.0000 mg | ORAL_TABLET | ORAL | 0 refills | Status: DC | PRN

## 2016-02-05 ENCOUNTER — Encounter (HOSPITAL_COMMUNITY): Payer: Medicare Other | Attending: Oncology

## 2016-02-05 ENCOUNTER — Encounter (HOSPITAL_COMMUNITY): Payer: Self-pay

## 2016-02-05 ENCOUNTER — Encounter (HOSPITAL_COMMUNITY): Payer: Medicare Other

## 2016-02-05 ENCOUNTER — Encounter (HOSPITAL_BASED_OUTPATIENT_CLINIC_OR_DEPARTMENT_OTHER): Payer: Medicare Other

## 2016-02-05 VITALS — BP 132/81 | HR 66 | Temp 97.6°F | Resp 18

## 2016-02-05 DIAGNOSIS — C61 Malignant neoplasm of prostate: Secondary | ICD-10-CM

## 2016-02-05 DIAGNOSIS — C7951 Secondary malignant neoplasm of bone: Secondary | ICD-10-CM

## 2016-02-05 DIAGNOSIS — Z95828 Presence of other vascular implants and grafts: Secondary | ICD-10-CM

## 2016-02-05 DIAGNOSIS — M899 Disorder of bone, unspecified: Secondary | ICD-10-CM | POA: Diagnosis not present

## 2016-02-05 LAB — CBC WITH DIFFERENTIAL/PLATELET
BASOS PCT: 0 %
Basophils Absolute: 0 10*3/uL (ref 0.0–0.1)
EOS ABS: 0.1 10*3/uL (ref 0.0–0.7)
EOS PCT: 2 %
HCT: 36.4 % — ABNORMAL LOW (ref 39.0–52.0)
Hemoglobin: 12.1 g/dL — ABNORMAL LOW (ref 13.0–17.0)
Lymphocytes Relative: 42 %
Lymphs Abs: 2.3 10*3/uL (ref 0.7–4.0)
MCH: 31 pg (ref 26.0–34.0)
MCHC: 33.2 g/dL (ref 30.0–36.0)
MCV: 93.3 fL (ref 78.0–100.0)
MONO ABS: 0.6 10*3/uL (ref 0.1–1.0)
MONOS PCT: 11 %
Neutro Abs: 2.5 10*3/uL (ref 1.7–7.7)
Neutrophils Relative %: 45 %
Platelets: 265 10*3/uL (ref 150–400)
RBC: 3.9 MIL/uL — ABNORMAL LOW (ref 4.22–5.81)
RDW: 15.3 % (ref 11.5–15.5)
WBC: 5.5 10*3/uL (ref 4.0–10.5)

## 2016-02-05 LAB — PSA: PSA: 2.77 ng/mL (ref 0.00–4.00)

## 2016-02-05 LAB — COMPREHENSIVE METABOLIC PANEL
ALBUMIN: 3.9 g/dL (ref 3.5–5.0)
ALT: 15 U/L — ABNORMAL LOW (ref 17–63)
ANION GAP: 6 (ref 5–15)
AST: 24 U/L (ref 15–41)
Alkaline Phosphatase: 41 U/L (ref 38–126)
BILIRUBIN TOTAL: 0.2 mg/dL — AB (ref 0.3–1.2)
BUN: 16 mg/dL (ref 6–20)
CO2: 24 mmol/L (ref 22–32)
Calcium: 8.9 mg/dL (ref 8.9–10.3)
Chloride: 106 mmol/L (ref 101–111)
Creatinine, Ser: 1.79 mg/dL — ABNORMAL HIGH (ref 0.61–1.24)
GFR calc Af Amer: 43 mL/min — ABNORMAL LOW (ref >60)
GFR calc non Af Amer: 37 mL/min — ABNORMAL LOW (ref >60)
GLUCOSE: 99 mg/dL (ref 65–99)
POTASSIUM: 4.2 mmol/L (ref 3.5–5.1)
SODIUM: 136 mmol/L (ref 135–145)
TOTAL PROTEIN: 8.5 g/dL — AB (ref 6.5–8.1)

## 2016-02-05 MED ORDER — DENOSUMAB 120 MG/1.7ML ~~LOC~~ SOLN
120.0000 mg | Freq: Once | SUBCUTANEOUS | Status: AC
  Administered 2016-02-05: 120 mg via SUBCUTANEOUS
  Filled 2016-02-05: qty 1.7

## 2016-02-05 MED ORDER — HEPARIN SOD (PORK) LOCK FLUSH 100 UNIT/ML IV SOLN
500.0000 [IU] | Freq: Once | INTRAVENOUS | Status: AC
  Administered 2016-02-05: 500 [IU] via INTRAVENOUS

## 2016-02-05 MED ORDER — OXYCODONE HCL 5 MG PO TABS: 5.0000 mg | ORAL_TABLET | ORAL | 0 refills | Status: DC | PRN

## 2016-02-05 MED ORDER — CYANOCOBALAMIN 1000 MCG/ML IJ SOLN
INTRAMUSCULAR | Status: AC
  Filled 2016-02-05: qty 1

## 2016-02-05 MED ORDER — SODIUM CHLORIDE 0.9% FLUSH
10.0000 mL | INTRAVENOUS | Status: DC | PRN
  Administered 2016-02-05: 10 mL via INTRAVENOUS
  Filled 2016-02-05: qty 10

## 2016-02-05 MED ORDER — CYANOCOBALAMIN 1000 MCG/ML IJ SOLN
1000.0000 ug | Freq: Once | INTRAMUSCULAR | Status: AC
  Administered 2016-02-05: 1000 ug via INTRAMUSCULAR

## 2016-02-05 NOTE — Progress Notes (Signed)
Jeff [name] presents today for injection per the provider's orders.  B12 and Xgeva administrations without incident; see MAR for injection details.  Patient tolerated procedure well and without incident.  No questions or complaints noted at this time.  Jeff [name] presented for Portacath access and flush.  Portacath located right chest wall accessed with  H 20 needle.  Good blood return present. Portacath flushed with 47ml NS and 500U/58ml Heparin and needle removed intact.  Procedure tolerated well and without incident.

## 2016-02-05 NOTE — Patient Instructions (Signed)
[place] at [facility] Discharge Instructions  RECOMMENDATIONS MADE BY THE CONSULTANT AND ANY TEST RESULTS WILL BE SENT TO YOUR REFERRING PHYSICIAN.  Port flush today. B12 and Xgeva injections today. Refill given for oxycodone. Return as scheduled for lab work and injections. Return as scheduled for office visit.   Thank you for choosing [place] at [facility] to provide your oncology and hematology care.  To afford each patient quality time with our provider, please arrive at least 15 minutes before your scheduled appointment time.   Beginning January 23rd 2017 lab work for the [facility] will be done in the  Main lab at [facility] on 1st floor. If you have a lab appointment with the [facility] please come in thru the  Main Entrance and check in at the main information desk  You need to re-schedule your appointment should you arrive 10 or more minutes late.  We strive to give you quality time with our providers, and arriving late affects you and other patients whose appointments are after yours.  Also, if you no show three or more times for appointments you may be dismissed from the clinic at the providers discretion.     Again, thank you for choosing [facility].  Our hope is that these requests will decrease the amount of time that you wait before being seen by our physicians.       _____________________________________________________________  Should you have questions after your visit to [facility], please contact our office at [phone] between the hours of 8:30 a.m. and 4:30 p.m.  Voicemails left after 4:30 p.m. will not be returned until the following business day.  For prescription refill requests, have your pharmacy contact our office.         Resources For Cancer Patients and their Caregivers ? American Cancer Society: Can assist with transportation, wigs, general needs, runs Look Good  Feel Better.        212-492-9938 ? Cancer Care: Provides financial assistance, online support groups, medication/co-pay assistance.  1-800-813-HOPE 6232670186) ? [place] Assists [place] cancer patients and their families through emotional , educational and financial support.  425-683-8552 ? [place] DSS Where to apply for food stamps, Medicaid and utility assistance. (585)286-6347 ? RCATS: Transportation to medical appointments. 212-648-1728 ? Social Security Administration: May apply for disability if have a Stage IV cancer. (228)331-9956 (250) 657-5587 ? [employer], Disability and Transit Services: Assists with nutrition, care and transit needs. [place] Support Programs: @10RELATIVEDAYS @ > Cancer Support Group  2nd Tuesday of the month 1pm-2pm, Journey Room  > Creative Journey  3rd Tuesday of the month 1130am-1pm, Journey Room  > Look Good Feel Better  1st Wednesday of the month 10am-12 noon, Journey Room (Call [place] to register [phone])

## 2016-02-05 NOTE — Progress Notes (Signed)
See injection encounter. 

## 2016-03-04 ENCOUNTER — Encounter (HOSPITAL_COMMUNITY): Payer: Medicare Other

## 2016-03-04 ENCOUNTER — Encounter (HOSPITAL_COMMUNITY): Payer: Self-pay

## 2016-03-04 ENCOUNTER — Encounter (HOSPITAL_COMMUNITY): Payer: Medicare Other | Attending: Hematology & Oncology

## 2016-03-04 VITALS — BP 123/73 | HR 68 | Temp 98.1°F | Resp 18

## 2016-03-04 DIAGNOSIS — C7951 Secondary malignant neoplasm of bone: Secondary | ICD-10-CM | POA: Diagnosis not present

## 2016-03-04 DIAGNOSIS — C61 Malignant neoplasm of prostate: Secondary | ICD-10-CM | POA: Diagnosis not present

## 2016-03-04 DIAGNOSIS — M899 Disorder of bone, unspecified: Secondary | ICD-10-CM

## 2016-03-04 LAB — CBC WITH DIFFERENTIAL/PLATELET
BASOS PCT: 0 %
Basophils Absolute: 0 10*3/uL (ref 0.0–0.1)
EOS ABS: 0.1 10*3/uL (ref 0.0–0.7)
Eosinophils Relative: 2 %
HEMATOCRIT: 37.1 % — AB (ref 39.0–52.0)
HEMOGLOBIN: 12.2 g/dL — AB (ref 13.0–17.0)
LYMPHS ABS: 2.3 10*3/uL (ref 0.7–4.0)
Lymphocytes Relative: 37 %
MCH: 30.9 pg (ref 26.0–34.0)
MCHC: 32.9 g/dL (ref 30.0–36.0)
MCV: 93.9 fL (ref 78.0–100.0)
Monocytes Absolute: 0.8 10*3/uL (ref 0.1–1.0)
Monocytes Relative: 13 %
NEUTROS ABS: 2.9 10*3/uL (ref 1.7–7.7)
NEUTROS PCT: 48 %
Platelets: 276 10*3/uL (ref 150–400)
RBC: 3.95 MIL/uL — AB (ref 4.22–5.81)
RDW: 14.7 % (ref 11.5–15.5)
WBC: 6 10*3/uL (ref 4.0–10.5)

## 2016-03-04 LAB — COMPREHENSIVE METABOLIC PANEL
ALBUMIN: 3.8 g/dL (ref 3.5–5.0)
ALK PHOS: 43 U/L (ref 38–126)
ALT: 19 U/L (ref 17–63)
AST: 28 U/L (ref 15–41)
Anion gap: 5 (ref 5–15)
BILIRUBIN TOTAL: 0.1 mg/dL — AB (ref 0.3–1.2)
BUN: 12 mg/dL (ref 6–20)
CALCIUM: 8.5 mg/dL — AB (ref 8.9–10.3)
CO2: 25 mmol/L (ref 22–32)
CREATININE: 1.67 mg/dL — AB (ref 0.61–1.24)
Chloride: 105 mmol/L (ref 101–111)
GFR calc Af Amer: 47 mL/min — ABNORMAL LOW (ref >60)
GFR calc non Af Amer: 40 mL/min — ABNORMAL LOW (ref >60)
GLUCOSE: 101 mg/dL — AB (ref 65–99)
Potassium: 4.7 mmol/L (ref 3.5–5.1)
SODIUM: 135 mmol/L (ref 135–145)
Total Protein: 8.3 g/dL — ABNORMAL HIGH (ref 6.5–8.1)

## 2016-03-04 LAB — PSA: PSA: 3.17 ng/mL (ref 0.00–4.00)

## 2016-03-04 MED ORDER — CYANOCOBALAMIN 1000 MCG/ML IJ SOLN
1000.0000 ug | Freq: Once | INTRAMUSCULAR | Status: AC
  Administered 2016-03-04: 1000 ug via INTRAMUSCULAR

## 2016-03-04 MED ORDER — OXYCODONE HCL 5 MG PO TABS
5.0000 mg | ORAL_TABLET | ORAL | 0 refills | Status: DC | PRN
Start: 2016-03-04 — End: 2016-04-01

## 2016-03-04 NOTE — Progress Notes (Signed)
Jeff [name] presents today for injection per the provider's orders.  B12 administration without incident; see MAR for injection details.  Patient tolerated procedure well and without incident.  No questions or complaints noted at this time.  Delton See held today due to calcium of 8.3.

## 2016-03-04 NOTE — Patient Instructions (Signed)
[place] at [facility] Discharge Instructions  RECOMMENDATIONS MADE BY THE CONSULTANT AND ANY TEST RESULTS WILL BE SENT TO YOUR REFERRING PHYSICIAN.  B12 injection given today. Refill for oxycodone given. Delton See held today due to low calcium level in your blood - continue taking calcium supplement as prescribed. Return as scheduled for lab work and office visit. Return as scheduled for injections.   Thank you for choosing [place] at [facility] to provide your oncology and hematology care.  To afford each patient quality time with our provider, please arrive at least 15 minutes before your scheduled appointment time.   Beginning January 23rd 2017 lab work for the [facility] will be done in the  Main lab at [facility] on 1st floor. If you have a lab appointment with the [facility] please come in thru the  Main Entrance and check in at the main information desk  You need to re-schedule your appointment should you arrive 10 or more minutes late.  We strive to give you quality time with our providers, and arriving late affects you and other patients whose appointments are after yours.  Also, if you no show three or more times for appointments you may be dismissed from the clinic at the providers discretion.     Again, thank you for choosing [facility].  Our hope is that these requests will decrease the amount of time that you wait before being seen by our physicians.       _____________________________________________________________  Should you have questions after your visit to [facility], please contact our office at [phone] between the hours of 8:30 a.m. and 4:30 p.m.  Voicemails left after 4:30 p.m. will not be returned until the following business day.  For prescription refill requests, have your pharmacy contact our office.         Resources For Cancer Patients and their Caregivers ? American  Cancer Society: Can assist with transportation, wigs, general needs, runs Look Good Feel Better.        831-403-9891 ? Cancer Care: Provides financial assistance, online support groups, medication/co-pay assistance.  1-800-813-HOPE 415-442-8290) ? [place] Assists [place] cancer patients and their families through emotional , educational and financial support.  4376681012 ? [place] DSS Where to apply for food stamps, Medicaid and utility assistance. ([phone] ? RCATS: Transportation to medical appointments. ([phone] ? Social Security Administration: May apply for disability if have a Stage IV cancer. [phone] ? [employer], Disability and Transit Services: Assists with nutrition, care and transit needs. [place] Support Programs: @10RELATIVEDAYS @ > Cancer Support Group  2nd Tuesday of the month 1pm-2pm, Journey Room  > Creative Journey  3rd Tuesday of the month 1130am-1pm, Journey Room  > Look Good Feel Better  1st Wednesday of the month 10am-12 noon, Journey Room (Call [place] to register [phone])

## 2016-04-01 ENCOUNTER — Encounter (HOSPITAL_COMMUNITY): Payer: Medicare Other

## 2016-04-01 ENCOUNTER — Encounter (HOSPITAL_BASED_OUTPATIENT_CLINIC_OR_DEPARTMENT_OTHER): Payer: Medicare Other

## 2016-04-01 ENCOUNTER — Encounter (HOSPITAL_COMMUNITY): Payer: Self-pay | Admitting: Oncology

## 2016-04-01 ENCOUNTER — Encounter (HOSPITAL_COMMUNITY): Payer: Medicare Other | Attending: Oncology | Admitting: Oncology

## 2016-04-01 DIAGNOSIS — M899 Disorder of bone, unspecified: Secondary | ICD-10-CM

## 2016-04-01 DIAGNOSIS — E538 Deficiency of other specified B group vitamins: Secondary | ICD-10-CM | POA: Diagnosis not present

## 2016-04-01 DIAGNOSIS — C7951 Secondary malignant neoplasm of bone: Secondary | ICD-10-CM

## 2016-04-01 DIAGNOSIS — Z95828 Presence of other vascular implants and grafts: Secondary | ICD-10-CM

## 2016-04-01 DIAGNOSIS — C61 Malignant neoplasm of prostate: Secondary | ICD-10-CM

## 2016-04-01 LAB — CBC WITH DIFFERENTIAL/PLATELET
BASOS ABS: 0 10*3/uL (ref 0.0–0.1)
BASOS PCT: 0 %
EOS ABS: 0.1 10*3/uL (ref 0.0–0.7)
Eosinophils Relative: 2 %
HCT: 36 % — ABNORMAL LOW (ref 39.0–52.0)
Hemoglobin: 11.8 g/dL — ABNORMAL LOW (ref 13.0–17.0)
Lymphocytes Relative: 33 %
Lymphs Abs: 2.1 10*3/uL (ref 0.7–4.0)
MCH: 30.6 pg (ref 26.0–34.0)
MCHC: 32.8 g/dL (ref 30.0–36.0)
MCV: 93.5 fL (ref 78.0–100.0)
MONO ABS: 0.5 10*3/uL (ref 0.1–1.0)
MONOS PCT: 8 %
NEUTROS ABS: 3.6 10*3/uL (ref 1.7–7.7)
Neutrophils Relative %: 57 %
PLATELETS: 265 10*3/uL (ref 150–400)
RBC: 3.85 MIL/uL — ABNORMAL LOW (ref 4.22–5.81)
RDW: 14.9 % (ref 11.5–15.5)
WBC: 6.3 10*3/uL (ref 4.0–10.5)

## 2016-04-01 LAB — COMPREHENSIVE METABOLIC PANEL
ALBUMIN: 3.8 g/dL (ref 3.5–5.0)
ALT: 16 U/L — ABNORMAL LOW (ref 17–63)
ANION GAP: 6 (ref 5–15)
AST: 27 U/L (ref 15–41)
Alkaline Phosphatase: 41 U/L (ref 38–126)
BUN: 19 mg/dL (ref 6–20)
CHLORIDE: 103 mmol/L (ref 101–111)
CO2: 26 mmol/L (ref 22–32)
Calcium: 9.6 mg/dL (ref 8.9–10.3)
Creatinine, Ser: 1.83 mg/dL — ABNORMAL HIGH (ref 0.61–1.24)
GFR calc Af Amer: 42 mL/min — ABNORMAL LOW (ref >60)
GFR calc non Af Amer: 36 mL/min — ABNORMAL LOW (ref >60)
GLUCOSE: 92 mg/dL (ref 65–99)
POTASSIUM: 4.7 mmol/L (ref 3.5–5.1)
SODIUM: 135 mmol/L (ref 135–145)
TOTAL PROTEIN: 8.4 g/dL — AB (ref 6.5–8.1)
Total Bilirubin: 0.4 mg/dL (ref 0.3–1.2)

## 2016-04-01 LAB — PSA: PSA: 3.7 ng/mL (ref 0.00–4.00)

## 2016-04-01 MED ORDER — CYANOCOBALAMIN 1000 MCG/ML IJ SOLN
INTRAMUSCULAR | Status: AC
  Filled 2016-04-01: qty 1

## 2016-04-01 MED ORDER — DENOSUMAB 120 MG/1.7ML ~~LOC~~ SOLN
120.0000 mg | Freq: Once | SUBCUTANEOUS | Status: AC
  Administered 2016-04-01: 120 mg via SUBCUTANEOUS
  Filled 2016-04-01: qty 1.7

## 2016-04-01 MED ORDER — HEPARIN SOD (PORK) LOCK FLUSH 100 UNIT/ML IV SOLN
INTRAVENOUS | Status: AC
Start: 2016-04-01 — End: 2016-04-01
  Filled 2016-04-01: qty 5

## 2016-04-01 MED ORDER — CYANOCOBALAMIN 1000 MCG/ML IJ SOLN
1000.0000 ug | Freq: Once | INTRAMUSCULAR | Status: AC
  Administered 2016-04-01: 1000 ug via INTRAMUSCULAR

## 2016-04-01 MED ORDER — HEPARIN SOD (PORK) LOCK FLUSH 100 UNIT/ML IV SOLN
500.0000 [IU] | Freq: Once | INTRAVENOUS | Status: AC
  Administered 2016-04-01: 500 [IU] via INTRAVENOUS

## 2016-04-01 MED ORDER — OXYCODONE HCL 5 MG PO TABS
5.0000 mg | ORAL_TABLET | ORAL | 0 refills | Status: DC | PRN
Start: 2016-04-01 — End: 2016-04-29

## 2016-04-01 NOTE — Progress Notes (Signed)
No PCP Per Patient No address on file  Prostate cancer metastatic to multiple sites ([place]) - Plan: oxyCODONE (OXY IR/ROXICODONE) 5 MG immediate release tablet  B12 deficiency  CURRENT THERAPY: Observation  INTERVAL HISTORY: Jeff [name] 70 y.o. male returns for followup of Stage IV adenocarcinoma of the prostate, hormone sensitive disease, with osseous involvement; S/P Docetaxel x 6 cycles 03/14/2014- 06/26/2014 with an excellent response.  Now with slowly rising PSA (doubling time ~ 9 months).    Prostate cancer metastatic to multiple sites [facility])   01/26/2014 Tumor Marker    PSA > 5000      01/28/2014 Initial Biopsy    Metastatic adenocarcinoma of prostate      02/05/2014 Surgery    Bilateral orchiectomy by Dr. Junious Silk      02/26/2014 Tumor Marker    PSA = 399.5      03/14/2014 Tumor Marker    PSA= 89.51      03/14/2014 - 06/26/2014 Chemotherapy    Docetaxel 75 mg/kg every 21 days      04/24/2014 Tumor Marker    PSA- 19.89      07/23/2014 Procedure    Nephrostomy tube removed by IR, Dr. Geroge Baseman      07/24/2014 Tumor Marker    PSA= 6.15      10/16/2014 Tumor Marker    PSA: 3.54       01/08/2015 Tumor Marker    PSA: 2.13       01/17/2015 Imaging    CT CAP-  Massive pelvic and retroperitoneal lymphadenopathy noted on the prior study has nearly completely resolved, now with only a small amount of residual amorphous soft tissue predominantly around the the infrarenal abdominal aorta.      01/17/2015 Imaging    Bone scan- Widespread osseous metastatic disease with multiple foci of increased activity throughout the skeleton, corresponding with the findings on the prior CT from October, 2015.      04/11/2015 Tumor Marker    PSA: 1.87       07/21/2015 Imaging    Bone scan- Bony metastatic disease again noted at multiple sites, stable from prior study. No progression of bony metastatic disease is demonstrable on this study.      07/25/2015  Imaging    CT CAP- Stable matted soft tissue density in the retroperitoneum and extraperitoneal pelvis consistent with treated disease. No recurrent lymphadenopathy.  No pulmonary metastatic disease. Diffuse stable sclerotic metastatic bone disease.      01/14/2016 Imaging    Bone scan-  Multiple sites of abnormal increased tracer localization involving BILATERAL ribs, T11, T12, questionably RIGHT scapula and LEFT iliac bone suspicious for osseous metastases.      01/23/2016 Imaging    CT CAP- 1. Similar widespread osseous metastasis. 2. Similar soft tissue thickening within the retroperitoneum of the abdomen. Improved left pelvic side wall soft tissue thickening. These are consistent with sites of treated disease. No well-defined adenopathy. 3. No new sites of disease.       He is doing very well.  He denies any new complaints.  He denies any residual peripheral neuropathy from previous chemotherapy.  He denies any new pain.  He continues with his chronic pain which is well managed with current pain therapy.  Review of Systems  Constitutional: Negative.  Negative for chills, fever and weight loss.  HENT: Negative.   Eyes: Negative.   Respiratory: Negative.  Negative for cough.   Cardiovascular: Negative.  Negative for chest pain.  Gastrointestinal: Negative.  Negative for constipation, diarrhea, nausea and vomiting.  Genitourinary: Negative.  Negative for dysuria.  Musculoskeletal: Negative.   Skin: Negative.   Neurological: Negative.  Negative for weakness.  Endo/Heme/Allergies: Negative.   Psychiatric/Behavioral: Negative.     Past Medical History:  Diagnosis Date  . Acute renal failure ([place]) 01/26/2014  . Alcohol abuse   . Anemia of chronic disease 01/26/2014  . Bilateral hydronephrosis 01/26/2014  . History of cocaine abuse 01/26/2014  . Homelessness 01/31/2014  . Prostate cancer metastatic to multiple sites ([place]) 01/30/2014  . Sepsis ([place])   . UTI (lower urinary tract  infection)     Past Surgical History:  Procedure Laterality Date  . ORCHIECTOMY Bilateral 02/05/2014   Procedure: BILATERAL ORCHIECTOMY;  Surgeon: Festus Aloe, MD;  Location: [facility];  Service: Urology;  Laterality: Bilateral;  . PERCUTANEOUS NEPHROSTOMY Bilateral    IR Dr. Junious Silk changed on 04/22/2014  . PORTACATH PLACEMENT Right 03/12/14    Family History  Problem Relation Age of Onset  . Cancer Brother 81    Social History   Social History  . Marital status: Single    Spouse name: N/A  . Number of children: 1  . Years of education: 9th   Occupational History  . disabilty [employer]   Social History Main Topics  . Smoking status: Current Every Day Smoker    Packs/day: 0.25    Years: 55.00    Types: Cigarettes  . Smokeless tobacco: Never Used  . Alcohol use No     Comment: none for at least 7 -8 years  . Drug use:     Types: Cocaine     Comment: last used Cocaine 01/25/14  . Sexual activity: Yes   Other Topics Concern  . None   Social History Narrative   Lives at [employer] 9th grade   1 son, lives in [place], [place]   Can read and write in native language              PHYSICAL EXAMINATION  ECOG PERFORMANCE STATUS: 0 - Asymptomatic  Vitals:   04/01/16 1044  BP: 116/82  Pulse: 69  Resp: 16  Temp: 98.2 F (36.8 C)    GENERAL:alert, no distress, well nourished, well developed, comfortable, cooperative, smiling and unaccompanied SKIN: skin color, texture, turgor are normal, no rashes or significant lesions HEAD: Normocephalic, No masses, lesions, tenderness or abnormalities EYES: normal, EOMI, Conjunctiva are pink and non-injected EARS: External ears normal OROPHARYNX:lips, buccal mucosa, and tongue normal and mucous membranes are moist  NECK: supple, no adenopathy, trachea midline LYMPH:  no palpable lymphadenopathy BREAST:not examined LUNGS: clear to auscultation  HEART: regular rate & rhythm ABDOMEN:abdomen  soft and normal bowel sounds BACK: Back symmetric, no curvature. EXTREMITIES:less then 2 second capillary refill, no joint deformities, effusion, or inflammation, no edema, no skin discoloration, no cyanosis  NEURO: alert & oriented x 3 with fluent speech, no focal motor/sensory deficits, gait normal   LABORATORY DATA: CBC    Component Value Date/Time   WBC 6.3 04/01/2016 0910   RBC 3.85 (L) 04/01/2016 0910   HGB 11.8 (L) 04/01/2016 0910   HCT 36.0 (L) 04/01/2016 0910   PLT 265 04/01/2016 0910   MCV 93.5 04/01/2016 0910   MCH 30.6 04/01/2016 0910   MCHC 32.8 04/01/2016 0910   RDW 14.9 04/01/2016 0910   LYMPHSABS 2.1 04/01/2016 0910   MONOABS 0.5 04/01/2016 0910   EOSABS 0.1 04/01/2016 0910   BASOSABS 0.0  04/01/2016 0910      Chemistry      Component Value Date/Time   NA 135 04/01/2016 0910   K 4.7 04/01/2016 0910   CL 103 04/01/2016 0910   CO2 26 04/01/2016 0910   BUN 19 04/01/2016 0910   CREATININE 1.83 (H) 04/01/2016 0910   CREATININE 1.39 (H) 02/06/2014 1012      Component Value Date/Time   CALCIUM 9.6 04/01/2016 0910   ALKPHOS 41 04/01/2016 0910   AST 27 04/01/2016 0910   ALT 16 (L) 04/01/2016 0910   BILITOT 0.4 04/01/2016 0910     Lab Results  Component Value Date   PSA 3.17 03/04/2016   PSA 2.77 02/05/2016   PSA 2.09 01/07/2016    PENDING LABS:   RADIOGRAPHIC STUDIES:  No results found.   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  Now with slowly rising PSA (doubling time ~ 9 months).  Oncology history is updated.  Labs today: CBC diff, CMET, PSA.  I personally reviewed and went over laboratory results with the patient.  The results are noted within this dictation.  I personally reviewed and went over radiographic studies with the  patient.  The results are noted within this dictation.  CT CAP and bone scan does not demonstrate progression of disease radiographically.  PSA is noted to climb with a doubling time of ~ 9 months.  As a result, we discussed treatment options.  He tolerated chemotherapy very well in the past with an excellent response.  He certainly is a candidate for re-challenge of treatment.  Other options include Xtandi/Zytiga+Prednisone.  We discussed the role of treatment and risks/benefits, pros and cons.  Given his tolerability and familiarity with Taxotere, the patient has opted to be re-challenged with Taxotere chemotherapy.  He wishes to wait until after the New Year which I think is reasonable given that his CT imaging is stable without any evidence of progressive disease.  He denies any residual peripheral neuropathy from previous chemotherapy.  Xgeva due today and every 4 weeks.  Return on day 1 of cycle 1 for follow-up.  B12 deficiency B12 deficiency, on B12 injections monthly.   ORDERS PLACED FOR THIS ENCOUNTER: No orders of the defined types were placed in this encounter.   MEDICATIONS PRESCRIBED THIS ENCOUNTER: Meds ordered this encounter  Medications  . oxyCODONE (OXY IR/ROXICODONE) 5 MG immediate release tablet    Sig: Take 1 tablet (5 mg total) by mouth every 4 (four) hours as needed for severe pain.    Dispense:  60 tablet    Refill:  0    Order Specific Question:   Supervising Provider    Answer:   Patrici Ranks [id number]    THERAPY PLAN:  Re-challenge with Taxotere due to rising PSA indicative of biochemical failure.  All questions were answered. The patient knows to call the clinic with any problems, questions or concerns. We can certainly see the patient much sooner if necessary.  Patient and plan discussed with Dr. Ancil Linsey and she is in agreement with the aforementioned.   This note is electronically signed by: Robynn Pane, PA-C 04/01/2016 11:10 AM

## 2016-04-01 NOTE — Progress Notes (Unsigned)
Jeff [name] presents today for injection per the provider's orders.  B12 and Xgeva administrations without incident; see MAR for injection details.  Jeff [name] presented for Portacath access and flush.  Portacath located right chest wall accessed with  H 20 needle.  Good blood return present. Portacath flushed with 41ml NS and 500U/41ml Heparin and needle removed intact.  Procedure tolerated well and without incident.  Patient tolerated procedure well and without incident.  No questions or complaints noted at this time.

## 2016-04-01 NOTE — Assessment & Plan Note (Addendum)
Stage IV adenocarcinoma of the prostate, hormone sensitive disease, with osseous involvement; S/P Docetaxel x 6 cycles 03/14/2014- 06/26/2014 with an excellent response.  Now with slowly rising PSA (doubling time ~ 9 months).  Oncology history is updated.  Labs today: CBC diff, CMET, PSA.  I personally reviewed and went over laboratory results with the patient.  The results are noted within this dictation.  I personally reviewed and went over radiographic studies with the patient.  The results are noted within this dictation.  CT CAP and bone scan does not demonstrate progression of disease radiographically.  PSA is noted to climb with a doubling time of ~ 9 months.  As a result, we discussed treatment options.  He tolerated chemotherapy very well in the past with an excellent response.  He certainly is a candidate for re-challenge of treatment.  Other options include Xtandi/Zytiga+Prednisone.  We discussed the role of treatment and risks/benefits, pros and cons.  Given his tolerability and familiarity with Taxotere, the patient has opted to be re-challenged with Taxotere chemotherapy.  He wishes to wait until after the New Year which I think is reasonable given that his CT imaging is stable without any evidence of progressive disease.  He denies any residual peripheral neuropathy from previous chemotherapy.  Xgeva due today and every 4 weeks.  Return on day 1 of cycle 1 for follow-up.

## 2016-04-01 NOTE — Assessment & Plan Note (Signed)
B12 deficiency, on B12 injections monthly.

## 2016-04-01 NOTE — Patient Instructions (Signed)
[place] at [facility] Discharge Instructions  RECOMMENDATIONS MADE BY THE CONSULTANT AND ANY TEST RESULTS WILL BE SENT TO YOUR REFERRING PHYSICIAN.  You saw Jeff Crigler, PA-C, today. Xgeva and B12 today and monthly.  Start chemo in Jan. 2018 with labs and follow up on 1st day of treatment. See Amy at checkout for appointments.  Thank you for choosing [place] at [facility] to provide your oncology and hematology care.  To afford each patient quality time with our provider, please arrive at least 15 minutes before your scheduled appointment time.   Beginning January 23rd 2017 lab work for the [facility] will be done in the  Main lab at [facility] on 1st floor. If you have a lab appointment with the [facility] please come in thru the  Main Entrance and check in at the main information desk  You need to re-schedule your appointment should you arrive 10 or more minutes late.  We strive to give you quality time with our providers, and arriving late affects you and other patients whose appointments are after yours.  Also, if you no show three or more times for appointments you may be dismissed from the clinic at the providers discretion.     Again, thank you for choosing [facility].  Our hope is that these requests will decrease the amount of time that you wait before being seen by our physicians.       _____________________________________________________________  Should you have questions after your visit to [facility], please contact our office at [phone] between the hours of 8:30 a.m. and 4:30 p.m.  Voicemails left after 4:30 p.m. will not be returned until the following business day.  For prescription refill requests, have your pharmacy contact our office.         Resources For Cancer Patients and their Caregivers ? American Cancer Society: Can assist with transportation, wigs, general needs,  runs Look Good Feel Better.        315-701-3218 ? Cancer Care: Provides financial assistance, online support groups, medication/co-pay assistance.  1-800-813-HOPE 312-365-5093) ? [place] Assists [place] cancer patients and their families through emotional , educational and financial support.  [phone] ? [place] DSS Where to apply for food stamps, Medicaid and utility assistance. [phone] ? RCATS: Transportation to medical appointments. 670-593-5812 ? Social Security Administration: May apply for disability if have a Stage IV cancer. 612-085-9898 941-707-2537 ? [employer], Disability and Transit Services: Assists with nutrition, care and transit needs. [place] Support Programs: @10RELATIVEDAYS @ > Cancer Support Group  2nd Tuesday of the month 1pm-2pm, Journey Room  > Creative Journey  3rd Tuesday of the month 1130am-1pm, Journey Room  > Look Good Feel Better  1st Wednesday of the month 10am-12 noon, Journey Room (Call [place] to register [phone])

## 2016-04-01 NOTE — Patient Instructions (Signed)
[place] at [facility] Discharge Instructions  RECOMMENDATIONS MADE BY THE CONSULTANT AND ANY TEST RESULTS WILL BE SENT TO YOUR REFERRING PHYSICIAN.  Xgeva and B12 injections today. Return as scheduled for injections. Return as scheduled for lab work and office visit.   Thank you for choosing [place] at [facility] to provide your oncology and hematology care.  To afford each patient quality time with our provider, please arrive at least 15 minutes before your scheduled appointment time.   Beginning January 23rd 2017 lab work for the [facility] will be done in the  Main lab at [facility] on 1st floor. If you have a lab appointment with the [facility] please come in thru the  Main Entrance and check in at the main information desk  You need to re-schedule your appointment should you arrive 10 or more minutes late.  We strive to give you quality time with our providers, and arriving late affects you and other patients whose appointments are after yours.  Also, if you no show three or more times for appointments you may be dismissed from the clinic at the providers discretion.     Again, thank you for choosing [facility].  Our hope is that these requests will decrease the amount of time that you wait before being seen by our physicians.       _____________________________________________________________  Should you have questions after your visit to [facility], please contact our office at [phone] between the hours of 8:30 a.m. and 4:30 p.m.  Voicemails left after 4:30 p.m. will not be returned until the following business day.  For prescription refill requests, have your pharmacy contact our office.         Resources For Cancer Patients and their Caregivers ? American Cancer Society: Can assist with transportation, wigs, general needs, runs Look Good Feel Better.        ([phone] ? Cancer  Care: Provides financial assistance, online support groups, medication/co-pay assistance.  1-800-813-HOPE 603-565-9030) ? [place] Assists [place] cancer patients and their families through emotional , educational and financial support.  478-532-6248 ? [place] DSS Where to apply for food stamps, Medicaid and utility assistance. (770)062-3868 ? RCATS: Transportation to medical appointments. 5850920223 ? Social Security Administration: May apply for disability if have a Stage IV cancer. 831-841-7682 512-462-5525 ? [employer], Disability and Transit Services: Assists with nutrition, care and transit needs. [place] Support Programs: @10RELATIVEDAYS @ > Cancer Support Group  2nd Tuesday of the month 1pm-2pm, Journey Room  > Creative Journey  3rd Tuesday of the month 1130am-1pm, Journey Room  > Look Good Feel Better  1st Wednesday of the month 10am-12 noon, Journey Room (Call [place] to register [phone])

## 2016-04-01 NOTE — Progress Notes (Signed)
See other encounter.

## 2016-04-05 ENCOUNTER — Other Ambulatory Visit (HOSPITAL_COMMUNITY): Payer: Self-pay | Admitting: Emergency Medicine

## 2016-04-10 ENCOUNTER — Other Ambulatory Visit: Payer: Self-pay | Admitting: Nurse Practitioner

## 2016-04-22 MED ORDER — ONDANSETRON HCL 8 MG PO TABS: 8.0000 mg | ORAL_TABLET | Freq: Three times a day (TID) | ORAL | 2 refills | Status: DC | PRN

## 2016-04-22 NOTE — Patient Instructions (Addendum)
[place]   CHEMOTHERAPY INSTRUCTIONS  You have prostate cancer.  Your PSA had started to increase.  We are going to start you on Jevtana.  This will be given every [redacted] weeks along with prednisone daily by month.  You will see the doctor regularly throughout treatment.  We monitor your lab work prior to every treatment.  The doctor monitors your response to treatment by the way you are feeling, your blood work, and scans periodically.  POTENTIAL SIDE EFFECTS OF TREATMENT:  Cabazitaxel (Generic Name) Other Names: Jevtana  About This Drug Cabazitaxel is a drug used to treat cancer. This drug is given in the vein (IV).  Possible Side Effects (More Common) . Nausea and throwing up (vomiting). These symptoms may happen within a few hours. Medicines are available to stop or lessen these side effects. . Bone marrow depression. This is a decrease in the number of white blood cells, red blood cells, and platelets. Bone marrow depression usually happens 7 to 12 days after treatment and may raise your risk of infection, make you tired and weak (fatigue), and raise your risk of bleeding. . Effects on the nerves are called peripheral neuropathy. You may feel numbness, tingling, or pain in your hands and feet. It may be hard for you to button your clothes, open jars, or walk as usual. The effect on the nerves may get worse with more doses of the drug. These effects get better in some people after the drug is stopped but it does not get better in all people. . Hair loss is often complete scalp hair loss and can involve loss of eyebrows, eyelashes, and pubic hair. You may notice this a few days or weeks after treatment has started. Often hair loss is temporary and will grow back once treatment is done. . Constipation (not able to move bowels) . Loose bowel movements (diarrhea) that may last for several days . Blood in the urine  Possible Side Effects (Less Common) .  Swelling, most often in your arms, hands, legs, or feet . Cough . Shortness of breath . Flu-like symptoms: fever, headache, muscle and joint aches, and fatigue (low energy, feeling weak) . Taste changes . Soreness of the mouth and throat. You may have red areas, white patches, or sores that hurt.  Allergic Reactions Serious allergic reactions including anaphylaxis are rare. While you are getting this drug in your vein (IV), tell your nurse right away if you have any of these symptoms of an allergic reaction: . Trouble catching your breath . Feeling like your tongue or throat are swelling . Feeling your heart beat quickly or in a not normal way (palpitations) . Feeling dizzy or lightheaded . Flushing, itching, rash, and/or hives  Treating Side Effects . Drink 6-8 cups of fluids each day unless your doctor has told you to limit your fluid intake due to some other health problem. A cup is 8 ounces of fluid. If you throw up or have loose bowel movements you should drink more fluids so that you do not become dehydrated (lack water in the body due to losing too much fluid). . Talk with your nurse about getting a wig before you lose your hair. Also, call the [facility] at 800-ACS-2345 to find out information about the "Look Good, Feel Better" program close to where you live. It is a free program where women getting chemotherapy can learn about wigs, turbans and scarves as well as makeup techniques and  skin and nail care. . Do not put anything on a rash unless your doctor or nurse says you may. Keep the area around the rash clean and dry. Ask your doctor for medicine if your rash bothers you. . Mouth care is very important. Your mouth care should consist of routine, gentle cleaning of your teeth or dentures and rinsing your mouth with a mixture of 1/2 teaspoon of salt in 8 ounces of water or  teaspoon of baking soda in 8 ounces of water. This should be done at least after each meal and at  bedtime. . If you have mouth sores, avoid mouthwash that contains alcohol. Avoid alcohol and smoking because they can irritate your mouth and throat. . Ask your doctor or nurse about medicine to help stop or lessen loose bowel movements (diarrhea), nausea, and vomiting. Take prescribed medicines to help relieve diarrhea, nausea, vomiting, and joint and muscle pain. . If you have numbness and tingling in your hands and feet, be careful when cooking, walking, and handling sharp objects and hot liquids. . Ask your doctor or nurse about medicines that are available to help stop or lessen constipation. . If you are not able to move your bowels, check with your doctor or nurse before you use enemas, laxatives, or suppositories  Food and Drug Interactions There are no known interactions of cabazitaxel with food. This drug may interact with other medicines. Tell your doctor and pharmacist about all the medicines and dietary supplements (vitamins, minerals, herbs and others) that you are taking at this time. The safety and use of dietary supplements and alternative diets are often not known. Using these might affect your cancer or interfere with your treatment. Until more is known, you should not use dietary supplements or alternative diets without your cancer doctor's help.  When to Call the Doctor Call your doctor or nurse right away if you have any of these symptoms: . Fever of 100.5 F (38 C) or above . Chills . Bleeding or bruising that is not usual . Wheezing or trouble breathing . Redness or tenderness along a vein . Nausea that stops you from eating or drinking . Throwing up multiple times in one day . Loose bowel movements (diarrhea) more than three times a day, or diarrhea with weakness or feeling lightheaded . No bowel movement in 3 days or if you feel uncomfortable. . Swelling of your arms, hands, legs or feet . Call your doctor or nurse as soon as possible if you have any of these  symptoms: . Numbness, tingling, decreased feeling or weakness in fingers, toes, arms, or legs . Trouble walking or changes in the way you walk, feeling clumsy when buttoning clothes, opening jars, or other routine hand motions . Joint and muscle pain that is not relieved by prescribed medicines . Weight gain of more than 5 pounds in a week . Pain in your mouth or throat that makes it hard to eat or drink  Sexual Problems and Reproductive Concerns . Sexual problems and reproduction concerns may happen. In both men and women, this drug may affect your ability to have children. This cannot be determined before your treatment. Talk with your doctor or nurse if you plan to have children. Ask for information on sperm or egg banking. . In men, this drug may interfere with your ability to make sperm, but it should not change your ability to have sexual relations. . In women, menstrual bleeding may become irregular or stop while you are getting  this drug. Do not assume that you cannot become pregnant if you do not have a menstrual period. Women may go through signs of menopause (change of life) like vaginal dryness or itching. Vaginal lubricants can be used to lessen vaginal dryness, itching, and pain during sexual relations. . Genetic counseling is available for you to talk about the effects of this drug therapy on future pregnancies. Also, a genetic counselor can look at the possible risk of problems in the unborn baby due to this medicine if an exposure happens during pregnancy. . Pregnancy warning: This drug may have harmful effects on the unborn child, so effective methods of birth control should be used during your cancer treatment. . Breast feeding warning: It is not known if this drug passes into breast milk. For this reason, women should talk to their doctor about the risks and benefits of breast feeding during treatment with this drug because this drug may enter the breast milk and badly harm a breast  feeding baby.   Prednisone (Generic Name) Other Names: Deltasone  About this drug Prednisone is a steroid that may be used to treat cancer. This drug is given by mouth.  Possible side effects (more common) . High number of white blood cells . Increased appetite and weight gain . More growth of facial hair . High blood sugars. Your blood glucose level will be checked as needed. . Electrolyte changes. Your blood will be checked for electrolyte changes as needed. . More at risk for infections such as herpes zoster, fungus infections, and delayed wound healing. . High blood pressure. Your doctor will check your blood pressure as needed. . Swelling of your legs, ankles and/or feet . Mood changes, which may include depression or a feeling of extreme wellbeing . Muscle weakness . Blurred vision or other changes in eyesight . Feeling confused or seeing, hearing, or smelling things that are not there (hallucinations) . Feeling restless, nervous, or irritable . Trouble sleeping  Possible side effects (less common) . Skin reactions such as rash, acne, facial redness, reddish-purple lines on the skin, or shiny skin. . Bloody or black, tarry stools . Pain or burning in the stomach or abdomen . Mild nausea and throwing up (vomiting) . Headache . Seizures . Leg cramps  Allergic reactions Allergic reactions, including anaphylaxis are rare but may happen in some patients. Signs of allergic reactions to this drug may be swelling of the face, feeling like your tongue or throat are swelling, trouble breathing, rash, itching, fever, chills, feeling dizzy, and/or feeling that your heart is beating in a fast or Not normal way. If you get symptoms of a reaction, do not take another dose of this drug. You should get urgent medical treatment.  Treating side effects . Drink 6-8 cups of fluids each day unless your doctor has told you to limit your fluid intake due to some other health problem. A cup is 8  ounces of fluid. If you throw up or have loose bowel movements, you should drink more fluids so that you do not become dehydrated (lack water in the body from losing too much fluid). . Ask your doctor or nurse about medicine to help you stop or lessen nausea and throwing up. . If you get a rash do not put anything on it unless your doctor or nurse says you may. Keep the area around the rash clean and dry. . Talk with your doctor or nurse if you feel you need help with your mood changes. [name] Kitchen  Limit your intake of alcohol and caffeine. . Do not take this drug close to bedtime; it may cause trouble sleeping. . Take this medicine with food to decrease the risk of upset stomach.  Important information . Prednisone should be taken with food. . If instructed by your doctor, check blood sugar levels during therapy and report them to the doctor if the levels are high.  Food and drug interactions There are no known interactions of prednisone with food. This drug may interact with other medicines. Tell your doctor and pharmacist about all the medicines and dietary supplements (vitamins, minerals, herbs and others) that you are taking at this time. The safety and use of dietary supplements and alternative diets are often not known. Using these might affect your cancer or interfere with your treatment. Until more is known, you should not use dietary supplements or alternative diets without your cancer doctor's help.  When to call the doctor Call your doctor or nurse right away if you have any of these symptoms: . Fever of 100.5 F (38 C) or higher . Chills . Easy bruising or bleeding . Wheezing or trouble breathing . Rash or itching . Feeling dizzy or lightheaded . Feeling that your heart is beating in a fast or not normal way (palpitations) . Loose bowel movements (diarrhea) more than 4 times a day or diarrhea with weakness or feeling lightheaded . Blurred vision or other changes in eyesight . Pain when  passing urine; blood in urine . Pain in your lower back or side . Confusion or agitation . Nausea that stops you from eating or drinking . Throwing up more than 3 times a day . Chest pain or symptoms of a heart attack. Most heart attacks involve pain in the center of the chest that lasts more than a few minutes. The pain may go away and come back. It can feel like pressure, squeezing, fullness, or pain. Sometimes pain is felt in one or both arms, the back, neck, jaw, or stomach. If any of these symptoms last 2 minutes, call 911. [name] Kitchen Symptoms of a stroke such as sudden numbness or weakness of your face, arm, or leg, mostly on one side of your body; sudden confusion, trouble speaking or understanding; sudden trouble seeing in one or both eyes; sudden trouble walking, feeling dizzy, loss of balance or coordination; or sudden, bad headache with no known cause. If you have any of these symptoms for 2 minutes, call 911. . Signs of liver problems: dark urine, pale bowel movements, bad stomach pain, feeling very tired and weak, itching, or yellowing of the eyes or skin. Call your doctor or nurse as soon as possible if any of these symptoms happen: . Change in hearing, ringing in the ears . Decreased urine . Unusual thirst or passing urine often . Pain in your mouth or throat that makes it hard to eat or drink . Nausea that is not relieved by prescribed medicines . Rash that is not relieved by prescribed medicines . Heavy menstrual period that lasts longer than normal . Numbness, tingling, decreased feeling or weakness in fingers, toes, arms, or legs . Trouble walking or changes in the way you walk, feeling clumsy when buttoning clothes, opening jars, or other routine hand motions . Swelling of legs, ankles, or feet . Weight gain of 5 pounds in one week (fluid retention) . Lasting loss of appetite or rapid weight loss of five pounds in a week . Fatigue that interferes with your daily activities .  Headache  that does not go away . Painful, red, or swollen areas on your hands or feet. . No bowel movement for 3 days or you feel uncomfortable   EDUCATIONAL MATERIALS GIVEN AND REVIEWED: Information on taxotere given.   SELF CARE ACTIVITIES WHILE ON CHEMOTHERAPY: Hydration Increase your fluid intake 48 hours prior to treatment and drink at least 8 to 12 cups (64 ounces) of water/decaff beverages per day after treatment. You can still have your cup of coffee or soda but these beverages do not count as part of your 8 to 12 cups that you need to drink daily. No alcohol intake.  Medications Continue taking your normal prescription medication as prescribed.  If you start any new herbal or new supplements please let us know first to make sure it is safe.  Mouth Care Have teeth cleaned professionally before starting treatment. Keep dentures and partial plates clean. Use soft toothbrush and do not use mouthwashes that contain alcohol. Biotene is a good mouthwash that is available at most pharmacies or may be ordered by calling [phone]. Use warm salt water gargles (1 teaspoon salt per 1 quart warm water) before and after meals and at bedtime. Or you may rinse with 2 tablespoons of three-percent hydrogen peroxide mixed in eight ounces of water. If you are still having problems with your mouth or sores in your mouth please call the clinic. If you need dental work, please let Dr. Whitney Muse know before you go for your appointment so that we can coordinate the best possible time for you in regards to your chemo regimen. You need to also let your dentist know that you are actively taking chemo. We may need to do labs prior to your dental appointment.   Skin Care Always use sunscreen that has not expired and with SPF (Sun Protection Factor) of 50 or higher. Wear hats to protect your head from the sun. Remember to use sunscreen on your hands, ears, face, & feet.  Use good moisturizing lotions such as udder cream,  eucerin, or even Vaseline. Some chemotherapies can cause dry skin, color changes in your skin and nails.    . Avoid long, hot showers or baths. . Use gentle, fragrance-free soaps and laundry detergent. . Use moisturizers, preferably creams or ointments rather than lotions because the thicker consistency is better at preventing skin dehydration. Apply the cream or ointment within 15 minutes of showering. Reapply moisturizer at night, and moisturize your hands every time after you wash them.  Hair Loss (if your doctor says your hair will fall out)  . If your doctor says that your hair is likely to fall out, decide before you begin chemo whether you want to wear a wig. You may want to shop before treatment to match your hair color. . Hats, turbans, and scarves can also camouflage hair loss, although some people prefer to leave their heads uncovered. If you go bare-headed outdoors, be sure to use sunscreen on your scalp. . Cut your hair short. It eases the inconvenience of shedding lots of hair, but it also can reduce the emotional impact of watching your hair fall out. . Don't perm or color your hair during chemotherapy. Those chemical treatments are already damaging to hair and can enhance hair loss. Once your chemo treatments are done and your hair has grown back, it's OK to resume dyeing or perming hair. With chemotherapy, hair loss is almost always temporary. But when it grows back, it may be a different color or  texture. In older adults who still had hair color before chemotherapy, the new growth may be completely gray.  Often, new hair is very fine and soft.  Infection Prevention Please wash your hands for at least 30 seconds using warm soapy water. Handwashing is the #1 way to prevent the spread of germs. Stay away from sick people or people who are getting over a cold. If you develop respiratory systems such as green/yellow mucus production or productive cough or persistent cough let us know and  we will see if you need an antibiotic. It is a good idea to keep a pair of gloves on when going into grocery stores/[employer] to decrease your risk of coming into contact with germs on the carts, etc. Carry alcohol hand gel with you at all times and use it frequently if out in public. If your temperature reaches 100.5 or higher please call the clinic and let us know.  If it is after hours or on the weekend please go to the ER if your temperature is over 100.5.  Please have your own personal thermometer at home to use.    Sex and bodily fluids If you are going to have sex, a condom must be used to protect the person that isn't taking chemotherapy. Chemo can decrease your libido (sex drive). For a few days after chemotherapy, chemotherapy can be excreted through your bodily fluids.  When using the toilet please close the lid and flush the toilet twice.  Do this for a few day after you have had chemotherapy.     Effects of chemotherapy on your sex life Some changes are simple and won't last long. They won't affect your sex life permanently. Sometimes you may feel: . too tired . not strong enough to be very active . sick or sore  . not in the mood . anxious or low Your anxiety might not seem related to sex. For example, you may be worried about the cancer and how your treatment is going. Or you may be worried about money, or about how you family are coping with your illness. These things can cause stress, which can affect your interest in sex. It's important to talk to your partner about how you feel. Remember - the changes to your sex life don't usually last long. There's usually no medical reason to stop having sex during chemo. The drugs won't have any long term physical effects on your performance or enjoyment of sex. Cancer can't be passed on to your partner during sex  Contraception It's important to use reliable contraception during treatment. Avoid getting pregnant while you or your partner are  having chemotherapy. This is because the drugs may harm the baby. Sometimes chemotherapy drugs can leave a man or woman infertile.  This means you would not be able to have children in the future. You might want to talk to someone about permanent infertility. It can be very difficult to learn that you may no longer be able to have children. Some people find counselling helpful. There might be ways to preserve your fertility, although this is easier for men than for women. You may want to speak to a fertility expert. You can talk about sperm banking or harvesting your eggs. You can also ask about other fertility options, such as donor eggs. If you have or have had breast cancer, your doctor might advise you not to take the contraceptive pill. This is because the hormones in it might affect the cancer.  It is  not known for sure whether or not chemotherapy drugs can be passed on through semen or secretions from the vagina. Because of this some doctors advise people to use a barrier method if you have sex during treatment. This applies to vaginal, anal or oral sex. Generally, doctors advise a barrier method only for the time you are actually having the treatment and for about a week after your treatment. Advice like this can be worrying, but this does not mean that you have to avoid being intimate with your partner. You can still have close contact with your partner and continue to enjoy sex.  Animals If you have cats or birds we just ask that you not change the litter or change the cage.  Please have someone else do this for you while you are on chemotherapy.   Food Safety During and After Cancer Treatment Food safety is important for people both during and after cancer treatment. Cancer and cancer treatments, such as chemotherapy, radiation therapy, and stem cell/bone marrow transplantation, often weaken the immune system. This makes it harder for your body to protect itself from foodborne illness, also  called food poisoning. Foodborne illness is caused by eating food that contains harmful bacteria, parasites, or viruses.  Foods to avoid Some foods have a higher risk of becoming tainted with bacteria. These include: [name] Kitchen Unwashed fresh fruit and vegetables, especially leafy vegetables that can hide dirt and other contaminants . Raw sprouts, such as alfalfa sprouts . Raw or undercooked beef, especially ground beef, or other raw or undercooked meat and poultry . Fatty, fried, or spicy foods immediately before or after treatment.  These can sit heavy on your stomach and make you feel nauseous. . Raw or undercooked shellfish, such as oysters. . Sushi and sashimi, which often contain raw fish.  . Unpasteurized beverages, such as unpasteurized fruit juices, raw milk, raw yogurt, or cider . Undercooked eggs, such as soft boiled, over easy, and poached; raw, unpasteurized eggs; or foods made with raw egg, such as homemade raw cookie dough and homemade mayonnaise Simple steps for food safety Shop smart. . Do not buy food stored or displayed in an unclean area. . Do not buy bruised or damaged fruits or vegetables. . Do not buy cans that have cracks, dents, or bulges. . Pick up foods that can spoil at the end of your shopping trip and store them in a cooler on the way home. Prepare and clean up foods carefully. . Rinse all fresh fruits and vegetables under running water, and dry them with a clean towel or paper towel. . Clean the top of cans before opening them. . After preparing food, wash your hands for 20 seconds with hot water and soap. Pay special attention to areas between fingers and under nails. . Clean your utensils and dishes with hot water and soap. [name] Kitchen Disinfect your kitchen and cutting boards using 1 teaspoon of liquid, unscented bleach mixed into 1 quart of water.   Dispose of old food. . Eat canned and packaged food before its expiration date (the "use by" or "best before" date). . Consume  refrigerated leftovers within 3 to 4 days. After that time, throw out the food. Even if the food does not smell or look spoiled, it still may be unsafe. Some bacteria, such as Listeria, can grow even on foods stored in the refrigerator if they are kept for too long. Take precautions when eating out. . At restaurants, avoid buffets and salad bars where food sits  out for a long time and comes in contact with many people. Food can become contaminated when someone with a virus, often a norovirus, or another "bug" handles it. . Put any leftover food in a "to-go" container yourself, rather than having the server do it. And, refrigerate leftovers as soon as you get home. . Choose restaurants that are clean and that are willing to prepare your food as you order it cooked.    MEDICATIONS: Prednisone 10 mg tablet.  Take 1 tablet daily with food.                                                                                                                                   Zofran/Ondansetron 8mg  tablet. Take 1 tablet every 8 hours as needed for nausea/vomiting. (#1 nausea med to take, this can constipate)  Compazine/Prochlorperazine 10mg  tablet. Take 1 tablet every 6 hours as needed for nausea/vomiting. (#2 nausea med to take, this can make you sleepy)   EMLA cream. Apply a quarter size amount to port site 1 hour prior to chemo. Do not rub in. Cover with plastic wrap.   Over-the-Counter Meds:  Miralax 1 capful in 8 oz of fluid daily. May increase to two times a day if needed. This is a stool softener. If this doesn't work proceed you can add:  Senokot S-start with 1 tablet two times a day and increase to 4 tablets two times a day if needed. (total of 8 tablets in a 24 hour period). This is a stimulant laxative.   Call us if this does not help your bowels move.   Imodium 2mg  capsule. Take 2 capsules after the 1st loose stool and then 1 capsule every 2 hours until you go a total of 12 hours without  having a loose stool. Call the [facility] if loose stools continue. If diarrhea occurs @ bedtime, take 2 capsules @ bedtime. Then take 2 capsules every 4 hours until morning. Call [facility].    Diarrhea Sheet  If you are having loose stools/diarrhea, please purchase Imodium and begin taking as outlined:  At the first sign of poorly formed or loose stools you should begin taking Imodium(loperamide) 2 mg capsules.  Take two caplets (4mg ) followed by one caplet (2mg ) every 2 hours until you have had no diarrhea for 12 hours.  During the night take two caplets (4mg ) at bedtime and continue every 4 hours during the night until the morning.  Stop taking Imodium only after there is no sign of diarrhea for 12 hours.    Always call the [facility] if you are having loose stools/diarrhea that you can't get under control.  Loose stools/disrrhea leads to dehydration (loss of water) in your body.  We have other options of trying to get the loose stools/diarrhea to stopped but you must let us know!    Constipation Sheet *Miralax in 8 oz of fluid daily.  May increase  to two times a day if needed.  This is a stool softener.  If this not enough to keep your bowel regular:  You can add:  *Senokot S, start with one tablet twice a day and can increase to 4 tablets twice a day if needed.  This is a stimulant laxative.   Sometimes when you take pain medication you need BOTH a medicine to keep your stool soft and a medicine to help your bowel push it out!  Please call if the above does not work for you.   Do not go more than 2 days without a bowel movement.  It is very important that you do not become constipated.  It will make you feel sick to your stomach (nausea) and can cause abdominal pain and vomiting.    Nausea Sheet  Zofran/Ondansetron 8mg  tablet. Take 1 tablet every 8 hours as needed for nausea/vomiting. (#1 nausea med to take, this can constipate)  Compazine/Prochlorperazine 10mg  tablet.  Take 1 tablet every 6 hours as needed for nausea/vomiting. (#2 nausea med to take, this can make you sleepy)  You can take these medications together or separately.  We would first like for you to try the Ondansetron by itself and then take the Prochloperizine if needed. But you are allowed to take both medications at the same time if your nausea is that severe.  If you are having persistent nausea (nausea that does not stop) please take these medications on a staggered schedule so that the nausea medication stays in your body.  Please call the [facility] and let us know the amount of nausea that you are experiencing.  If you begin to vomit, you need to call the [facility] and if it is the weekend and you have vomited more than one time and cant get it to stop-go to the Emergency Room.  Persistent nausea/vomiting can lead to dehydration (loss of fluid in your body) and will make you feel terrible.   Ice chips, sips of clear liquids, foods that are @ room temperature, crackers, and toast tend to be better tolerated.    SYMPTOMS TO REPORT AS SOON AS POSSIBLE AFTER TREATMENT:  FEVER GREATER THAN 100.5 F  CHILLS WITH OR WITHOUT FEVER  NAUSEA AND VOMITING THAT IS NOT CONTROLLED WITH YOUR NAUSEA MEDICATION  UNUSUAL SHORTNESS OF BREATH  UNUSUAL BRUISING OR BLEEDING  TENDERNESS IN MOUTH AND THROAT WITH OR WITHOUT PRESENCE OF ULCERS  URINARY PROBLEMS  BOWEL PROBLEMS  UNUSUAL RASH    Wear comfortable clothing and clothing appropriate for easy access to any Portacath or PICC line. Let us know if there is anything that we can do to make your therapy better!    What to do if you need assistance after hours or on the weekends: CALL [phone].  HOLD on the line, do not hang up.  You will hear multiple messages but at the end you will be connected with a nurse triage line.  They will contact Dr Whitney Muse if necessary.  Most of the time they will be able to assist you.   Do not call the  hospital operator.  Dr Whitney Muse will not answer phone calls received by them.     I have been informed and understand all of the instructions given to me and have received a copy. I have been instructed to call the clinic [phone] or my family physician as soon as possible for continued medical care, if indicated. I do not have any more questions at  this time but understand that I may call the [facility] or the Patient Navigator at [phone] during office hours should I have questions or need assistance in obtaining follow-up care.

## 2016-04-27 ENCOUNTER — Other Ambulatory Visit (HOSPITAL_COMMUNITY): Payer: Self-pay | Admitting: Hematology & Oncology

## 2016-04-27 MED ORDER — PREDNISONE 10 MG PO TABS: 10.0000 mg | ORAL_TABLET | Freq: Every day | ORAL | 3 refills | Status: DC

## 2016-04-27 NOTE — Progress Notes (Signed)
START OFF PATHWAY REGIMEN - Prostate  Off Pathway: Cabazitaxel 20 mg/m2 q21 Days  [id number]:Cabazitaxel 20 mg/m2 q21 Days:   A cycle is every 21 days.:     Cabazitaxel (Jevtana(R)) 20 mg/m2 in 250 mL NS IV (non-PVC container) over one hour on day 1. Use in-line 0.22 micrometer filter. Final concentration 0.1 - 0.26 mg/mL. Dose Mod: None     Prednisone 10 mg orally daily throughout treatment with cabazitaxel Dose Mod: None Additional Orders: May consider administering prednisone as 5 mg PO BID.  **Always confirm dose/schedule in your pharmacy ordering system**    Patient Characteristics: Adenocarcinoma, Metastatic, Castration Resistant, Asymptomatic/Minimally Symptomatic, Second Line AJCC T Stage: X AJCC Stage Grouping: IV Current radiographic evidence of distant metastasis? Yes PSA: X Gleason Primary: X Gleason Secondary: X Gleason Score: X AJCC M Stage: X AJCC N Stage: X Would you be surprised if this patient died  in the next year? I would be surprised if this patient died in the next year Line of therapy: Second Line  Intent of Therapy: Non-Curative / Palliative Intent, Discussed with Patient

## 2016-04-28 ENCOUNTER — Other Ambulatory Visit (HOSPITAL_COMMUNITY): Payer: Self-pay | Admitting: Pharmacist

## 2016-04-29 ENCOUNTER — Encounter (HOSPITAL_COMMUNITY): Payer: Medicare Other | Attending: Hematology & Oncology

## 2016-04-29 ENCOUNTER — Encounter (HOSPITAL_COMMUNITY): Payer: Medicare Other | Attending: Oncology

## 2016-04-29 ENCOUNTER — Encounter (HOSPITAL_COMMUNITY): Payer: Self-pay

## 2016-04-29 VITALS — BP 103/68 | HR 93 | Temp 97.5°F | Resp 18

## 2016-04-29 DIAGNOSIS — M899 Disorder of bone, unspecified: Secondary | ICD-10-CM

## 2016-04-29 DIAGNOSIS — C61 Malignant neoplasm of prostate: Secondary | ICD-10-CM | POA: Diagnosis not present

## 2016-04-29 DIAGNOSIS — E538 Deficiency of other specified B group vitamins: Secondary | ICD-10-CM | POA: Diagnosis not present

## 2016-04-29 DIAGNOSIS — C7951 Secondary malignant neoplasm of bone: Secondary | ICD-10-CM

## 2016-04-29 LAB — CBC WITH DIFFERENTIAL/PLATELET
BASOS PCT: 0 %
Basophils Absolute: 0 10*3/uL (ref 0.0–0.1)
Eosinophils Absolute: 0.1 10*3/uL (ref 0.0–0.7)
Eosinophils Relative: 1 %
HEMATOCRIT: 38.6 % — AB (ref 39.0–52.0)
Hemoglobin: 12.4 g/dL — ABNORMAL LOW (ref 13.0–17.0)
LYMPHS PCT: 27 %
Lymphs Abs: 2 10*3/uL (ref 0.7–4.0)
MCH: 30.2 pg (ref 26.0–34.0)
MCHC: 32.1 g/dL (ref 30.0–36.0)
MCV: 94.1 fL (ref 78.0–100.0)
MONOS PCT: 8 %
Monocytes Absolute: 0.6 10*3/uL (ref 0.1–1.0)
NEUTROS ABS: 4.7 10*3/uL (ref 1.7–7.7)
Neutrophils Relative %: 64 %
Platelets: 290 10*3/uL (ref 150–400)
RBC: 4.1 MIL/uL — ABNORMAL LOW (ref 4.22–5.81)
RDW: 14.7 % (ref 11.5–15.5)
WBC: 7.3 10*3/uL (ref 4.0–10.5)

## 2016-04-29 LAB — COMPREHENSIVE METABOLIC PANEL
ALT: 16 U/L — ABNORMAL LOW (ref 17–63)
ANION GAP: 7 (ref 5–15)
AST: 26 U/L (ref 15–41)
Albumin: 4.2 g/dL (ref 3.5–5.0)
Alkaline Phosphatase: 50 U/L (ref 38–126)
BILIRUBIN TOTAL: 0.5 mg/dL (ref 0.3–1.2)
BUN: 22 mg/dL — ABNORMAL HIGH (ref 6–20)
CO2: 23 mmol/L (ref 22–32)
Calcium: 9.1 mg/dL (ref 8.9–10.3)
Chloride: 104 mmol/L (ref 101–111)
Creatinine, Ser: 1.69 mg/dL — ABNORMAL HIGH (ref 0.61–1.24)
GFR calc Af Amer: 46 mL/min — ABNORMAL LOW (ref >60)
GFR, EST NON AFRICAN AMERICAN: 40 mL/min — AB (ref >60)
Glucose, Bld: 134 mg/dL — ABNORMAL HIGH (ref 65–99)
POTASSIUM: 4.8 mmol/L (ref 3.5–5.1)
Sodium: 134 mmol/L — ABNORMAL LOW (ref 135–145)
TOTAL PROTEIN: 9.3 g/dL — AB (ref 6.5–8.1)

## 2016-04-29 MED ORDER — OXYCODONE HCL 5 MG PO TABS: 5.0000 mg | ORAL_TABLET | ORAL | 0 refills | Status: DC | PRN

## 2016-04-29 MED ORDER — CYANOCOBALAMIN 1000 MCG/ML IJ SOLN
1000.0000 ug | Freq: Once | INTRAMUSCULAR | Status: AC
  Administered 2016-04-29: 1000 ug via INTRAMUSCULAR

## 2016-04-29 MED ORDER — DENOSUMAB 120 MG/1.7ML ~~LOC~~ SOLN
120.0000 mg | Freq: Once | SUBCUTANEOUS | Status: AC
  Administered 2016-04-29: 120 mg via SUBCUTANEOUS
  Filled 2016-04-29: qty 1.7

## 2016-04-29 MED ORDER — CYANOCOBALAMIN 1000 MCG/ML IJ SOLN
INTRAMUSCULAR | Status: AC
  Filled 2016-04-29: qty 1

## 2016-04-29 NOTE — Progress Notes (Signed)
Jeff [name] presents today for injection per MD orders. B12 1,010mcg administered IM in right Upper Arm. Administration without incident. Patient tolerated well.   Jeff [name] presents today for injection per MD orders. Xgeva  120mg  administered SQ in right Abdomen. Administration without incident. Patient tolerated well.  Vitals stable and discharged from clinic ambulatory. Follow up as scheduled. Prescription refilled and given

## 2016-05-03 ENCOUNTER — Encounter (HOSPITAL_COMMUNITY): Payer: Medicare Other

## 2016-05-03 ENCOUNTER — Encounter (HOSPITAL_BASED_OUTPATIENT_CLINIC_OR_DEPARTMENT_OTHER): Payer: Medicare Other

## 2016-05-03 ENCOUNTER — Other Ambulatory Visit (HOSPITAL_COMMUNITY): Payer: Self-pay | Admitting: Oncology

## 2016-05-03 ENCOUNTER — Encounter (HOSPITAL_BASED_OUTPATIENT_CLINIC_OR_DEPARTMENT_OTHER): Payer: Medicare Other | Admitting: Oncology

## 2016-05-03 VITALS — BP 117/74 | HR 63 | Temp 98.4°F | Resp 18 | Wt 174.8 lb

## 2016-05-03 DIAGNOSIS — Z5111 Encounter for antineoplastic chemotherapy: Secondary | ICD-10-CM

## 2016-05-03 DIAGNOSIS — C61 Malignant neoplasm of prostate: Secondary | ICD-10-CM

## 2016-05-03 DIAGNOSIS — C7951 Secondary malignant neoplasm of bone: Secondary | ICD-10-CM | POA: Diagnosis not present

## 2016-05-03 MED ORDER — DIPHENHYDRAMINE HCL 50 MG/ML IJ SOLN
25.0000 mg | Freq: Once | INTRAMUSCULAR | Status: AC
  Administered 2016-05-03: 25 mg via INTRAVENOUS
  Filled 2016-05-03: qty 1

## 2016-05-03 MED ORDER — FAMOTIDINE IN NACL 20-0.9 MG/50ML-% IV SOLN
INTRAVENOUS | Status: AC
  Filled 2016-05-03: qty 50

## 2016-05-03 MED ORDER — SODIUM CHLORIDE 0.9% FLUSH
10.0000 mL | INTRAVENOUS | Status: DC | PRN
  Administered 2016-05-03: 10 mL
  Filled 2016-05-03: qty 10

## 2016-05-03 MED ORDER — DEXAMETHASONE SODIUM PHOSPHATE 10 MG/ML IJ SOLN
10.0000 mg | Freq: Once | INTRAMUSCULAR | Status: AC
  Administered 2016-05-03: 10 mg via INTRAVENOUS
  Filled 2016-05-03: qty 1

## 2016-05-03 MED ORDER — DEXTROSE 5 % IV SOLN
20.0000 mg/m2 | Freq: Once | INTRAVENOUS | Status: AC
Start: 2016-05-03 — End: 2016-05-03
  Administered 2016-05-03: 41 mg via INTRAVENOUS
  Filled 2016-05-03: qty 4.1

## 2016-05-03 MED ORDER — SODIUM CHLORIDE 0.9 % IV SOLN: 10.0000 mg | Freq: Once | INTRAVENOUS | Status: DC

## 2016-05-03 MED ORDER — HEPARIN SOD (PORK) LOCK FLUSH 100 UNIT/ML IV SOLN
500.0000 [IU] | Freq: Once | INTRAVENOUS | Status: AC | PRN
  Administered 2016-05-03: 500 [IU]
  Filled 2016-05-03 (×2): qty 5

## 2016-05-03 MED ORDER — SODIUM CHLORIDE 0.9 % IV SOLN
Freq: Once | INTRAVENOUS | Status: AC
  Administered 2016-05-03: 09:00:00 via INTRAVENOUS

## 2016-05-03 MED ORDER — FAMOTIDINE IN NACL 20-0.9 MG/50ML-% IV SOLN
20.0000 mg | Freq: Once | INTRAVENOUS | Status: AC
  Administered 2016-05-03: 20 mg via INTRAVENOUS

## 2016-05-03 NOTE — Assessment & Plan Note (Addendum)
Stage IV adenocarcinoma of the prostate, hormone sensitive disease, with osseous involvement; S/P Docetaxel x 6 cycles 03/14/2014- 06/26/2014 with an excellent response.  Rising PSA resulted in restart of therapy with Jevtana, 05/03/2016.  Now with slowly rising PSA (doubling time ~ 9 months).  Oncology history is updated.  Pre-treatment labs today: CBC diff, CMET, PSA.  I personally reviewed and went over laboratory results with the patient.  The results are noted within this dictation.  I reviewed the risks, benefits, alternatives, and side effects of Jevtana including, but not limited to, fatigue, peripheral neuropathy, diarrhea, nausea, vomiting, constipation, decreased appetite, abdominal pain, anorexia, dysgeusia, hematuria, decreased blood counts, increased risk of infection, weakness, back pain, arthralgia, dyspnea, cough, fevers, peripheral edema, weight loss, dyspepsia, increase in liver enzymes, and electrolyte disturbances.  Xgeva due in 4 weeks.  Return in 7-10 days for NADIR check and tolerability check with labs: CBC diff, CMET.

## 2016-05-03 NOTE — Progress Notes (Signed)
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  Xgeva monthly to reduce risk of SRE.  INTERVAL HISTORY: Jeff [name] 71 y.o. male returns for followup of Stage IV adenocarcinoma of the prostate, hormone sensitive disease, with osseous involvement; S/P Docetaxel x 6 cycles 03/14/2014- 06/26/2014 with an excellent response.  Now with slowly rising PSA (doubling time ~ 9 months) resulting in re-initiation of chemotherapy consisting of Jevtana beginning on 05/03/2016.    Prostate cancer metastatic to multiple sites [facility])   01/26/2014 Tumor Marker    PSA > 5000      01/28/2014 Initial Biopsy    Metastatic adenocarcinoma of prostate      02/05/2014 Surgery    Bilateral orchiectomy by Dr. Junious Silk      02/26/2014 Tumor Marker    PSA = 399.5      03/14/2014 Tumor Marker    PSA= 89.51      03/14/2014 - 06/26/2014 Chemotherapy    Docetaxel 75 mg/kg every 21 days x 6 cycles      04/24/2014 Tumor Marker    PSA- 19.89      07/23/2014 Procedure    Nephrostomy tube removed by IR, Dr. Geroge Baseman      07/24/2014 Tumor Marker    PSA= 6.15      10/16/2014 Tumor Marker    PSA: 3.54       01/08/2015 Tumor Marker    PSA: 2.13       01/17/2015 Imaging    CT CAP-  Massive pelvic and retroperitoneal lymphadenopathy noted on the prior study has nearly completely resolved, now with only a small amount of residual amorphous soft tissue predominantly around the the infrarenal abdominal aorta.      01/17/2015 Imaging    Bone scan- Widespread osseous metastatic disease with multiple foci of increased activity throughout the skeleton, corresponding with the findings on the prior CT from October, 2015.      04/11/2015 Tumor Marker    PSA: 1.87       07/21/2015 Imaging    Bone scan- Bony metastatic disease again noted at multiple sites, stable  from prior study. No progression of bony metastatic disease is demonstrable on this study.      07/25/2015 Imaging    CT CAP- Stable matted soft tissue density in the retroperitoneum and extraperitoneal pelvis consistent with treated disease. No recurrent lymphadenopathy.  No pulmonary metastatic disease. Diffuse stable sclerotic metastatic bone disease.      01/14/2016 Imaging    Bone scan-  Multiple sites of abnormal increased tracer localization involving BILATERAL ribs, T11, T12, questionably RIGHT scapula and LEFT iliac bone suspicious for osseous metastases.      01/23/2016 Imaging    CT CAP- 1. Similar widespread osseous metastasis. 2. Similar soft tissue thickening within the retroperitoneum of the abdomen. Improved left pelvic side wall soft tissue thickening. These are consistent with sites of treated disease. No well-defined adenopathy. 3. No new sites of disease.      05/03/2016 -  Chemotherapy    The patient had ondansetron (ZOFRAN) 8 mg in sodium chloride 0.9 % 50 mL IVPB, 8 mg, Intravenous,  Once, 6 of 6 cycles  pegfilgrastim (NEULASTA) injection 6 mg, 6 mg, Subcutaneous,  Once, 6 of 6 cycles  DOCEtaxel (TAXOTERE) 140 mg in dextrose 5 % 250 mL chemo infusion, 75 mg/m2 =  140 mg, Intravenous,  Once, 6 of 6 cycles  pegfilgrastim (NEULASTA) injection 6 mg, 6 mg, Subcutaneous,  Once, 0 of 4 cycles  cabazitaxel (JEVTANA) 41 mg in dextrose 5 % 250 mL chemo infusion, 20 mg/m2 = 41 mg (100 % of original dose 20 mg/m2), Intravenous,  Once, 0 of 4 cycles Dose modification: 20 mg/m2 (original dose 20 mg/m2, Cycle 1, Reason: Dose not tolerated, Comment: recommended by Data to use 20mg /m)  for chemotherapy treatment.         [name] is here to start therapy today with Jevtana.  He is educated on the reason for this change in therapy compared to a re-challenge with Taxotere.  The changes was because of VIA PATHWAYS.  Either option was appropriate in accordance with NCCN  guidelines.  He is doing well.  He denies any new complaints.  Review of Systems  Constitutional: Negative for chills, fever and weight loss.  Eyes: Negative.   Respiratory: Negative.  Negative for cough.   Cardiovascular: Negative.  Negative for chest pain.  Gastrointestinal: Negative.  Negative for constipation, diarrhea, nausea and vomiting.  Genitourinary: Negative.   Musculoskeletal: Negative.   Skin: Negative.  Negative for rash.  Neurological: Negative.  Negative for weakness.  Endo/Heme/Allergies: Negative.   Psychiatric/Behavioral: Negative.     Past Medical History:  Diagnosis Date  . Acute renal failure ([place]) 01/26/2014  . Alcohol abuse   . Anemia of chronic disease 01/26/2014  . Bilateral hydronephrosis 01/26/2014  . History of cocaine abuse 01/26/2014  . Homelessness 01/31/2014  . Prostate cancer metastatic to multiple sites ([place]) 01/30/2014  . Sepsis ([place])   . UTI (lower urinary tract infection)     Past Surgical History:  Procedure Laterality Date  . ORCHIECTOMY Bilateral 02/05/2014   Procedure: BILATERAL ORCHIECTOMY;  Surgeon: Festus Aloe, MD;  Location: [facility];  Service: Urology;  Laterality: Bilateral;  . PERCUTANEOUS NEPHROSTOMY Bilateral    IR Dr. Junious Silk changed on 04/22/2014  . PORTACATH PLACEMENT Right 03/12/14    Family History  Problem Relation Age of Onset  . Cancer Brother 67    Social History   Social History  . Marital status: Single    Spouse name: N/A  . Number of children: 1  . Years of education: 9th   Occupational History  . disabilty [name]   Social History Main Topics  . Smoking status: Current Every Day Smoker    Packs/day: 0.25    Years: 55.00    Types: Cigarettes  . Smokeless tobacco: Never Used  . Alcohol use No     Comment: none for at least 7 -8 years  . Drug use:     Types: Cocaine     Comment: last used Cocaine 01/25/14  . Sexual activity: Yes   Other Topics Concern  . Not on file   Social  History Narrative   Lives at [employer] 9th grade   1 son, lives in [place], [place]   Can read and write in native language              PHYSICAL EXAMINATION  ECOG PERFORMANCE STATUS: 0 - Asymptomatic  There were no vitals filed for this visit.  Vitals - 1 value per visit [id number]  SYSTOLIC 0000000  DIASTOLIC 81  Pulse 69  Temperature 97.7  Respirations 18  Weight (lb) 174.8    GENERAL:alert, no distress, well nourished, well developed, comfortable, cooperative, smiling and unaccompanied, in chemo-recliner. SKIN: skin color, texture, turgor  are normal, no rashes or significant lesions HEAD: Normocephalic, No masses, lesions, tenderness or abnormalities EYES: normal, EOMI, Conjunctiva are pink and non-injected EARS: External ears normal OROPHARYNX:lips, buccal mucosa, and tongue normal and mucous membranes are moist  NECK: supple, no adenopathy, trachea midline LYMPH:  no palpable lymphadenopathy BREAST:not examined LUNGS: clear to auscultation  HEART: regular rate & rhythm ABDOMEN:abdomen soft and normal bowel sounds BACK: Back symmetric, no curvature. EXTREMITIES:less then 2 second capillary refill, no joint deformities, effusion, or inflammation, no skin discoloration, no cyanosis  NEURO: alert & oriented x 3 with fluent speech, no focal motor/sensory deficits, gait normal   LABORATORY DATA: CBC    Component Value Date/Time   WBC 7.3 04/29/2016 1042   RBC 4.10 (L) 04/29/2016 1042   HGB 12.4 (L) 04/29/2016 1042   HCT 38.6 (L) 04/29/2016 1042   PLT 290 04/29/2016 1042   MCV 94.1 04/29/2016 1042   MCH 30.2 04/29/2016 1042   MCHC 32.1 04/29/2016 1042   RDW 14.7 04/29/2016 1042   LYMPHSABS 2.0 04/29/2016 1042   MONOABS 0.6 04/29/2016 1042   EOSABS 0.1 04/29/2016 1042   BASOSABS 0.0 04/29/2016 1042      Chemistry      Component Value Date/Time   NA 134 (L) 04/29/2016 1042   K 4.8 04/29/2016 1042   CL 104 04/29/2016 1042   CO2 23 04/29/2016  1042   BUN 22 (H) 04/29/2016 1042   CREATININE 1.69 (H) 04/29/2016 1042   CREATININE 1.39 (H) 02/06/2014 1012      Component Value Date/Time   CALCIUM 9.1 04/29/2016 1042   ALKPHOS 50 04/29/2016 1042   AST 26 04/29/2016 1042   ALT 16 (L) 04/29/2016 1042   BILITOT 0.5 04/29/2016 1042     Lab Results  Component Value Date   PSA 3.70 04/01/2016   PSA 3.17 03/04/2016   PSA 2.77 02/05/2016     PENDING LABS:   RADIOGRAPHIC STUDIES:  No results found.   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  Rising PSA resulted in restart of therapy with Jevtana, 05/03/2016.  Now with slowly rising PSA (doubling time ~ 9 months).  Oncology history is updated.  Pre-treatment labs today: CBC diff, CMET, PSA.  I personally reviewed and went over laboratory results with the patient.  The results are noted within this dictation.  I reviewed the risks, benefits, alternatives, and side effects of Jevtana including, but not limited to, fatigue, peripheral neuropathy, diarrhea, nausea, vomiting, constipation, decreased appetite, abdominal pain, anorexia, dysgeusia, hematuria, decreased blood counts, increased risk of infection, weakness, back pain, arthralgia, dyspnea, cough, fevers, peripheral edema, weight loss, dyspepsia, increase in liver enzymes, and electrolyte disturbances.  Xgeva due in 4 weeks.  Return in 7-10 days for NADIR check and tolerability check with labs: CBC diff, CMET.   ORDERS PLACED FOR THIS ENCOUNTER: Orders Placed This Encounter  Procedures  . PSA    MEDICATIONS PRESCRIBED THIS ENCOUNTER: No orders of the defined types were placed in this encounter.   THERAPY PLAN:  Jevtana every 3 weeks and Xgeva + B12 every 4 weeks.  All questions were answered. The patient  knows to call the clinic with any problems, questions or concerns. We can certainly see the patient much sooner if necessary.  Patient and plan discussed with Dr. Ancil Linsey and she  is in agreement with the aforementioned.   This note is electronically signed by: Doy Mince 05/03/2016 10:49 AM

## 2016-05-03 NOTE — Patient Instructions (Addendum)
[facility] at [facility] Discharge Instructions  RECOMMENDATIONS MADE BY THE CONSULTANT AND ANY TEST RESULTS WILL BE SENT TO YOUR REFERRING PHYSICIAN.  XGEVA + b12 every 4 weeks  Treatment today - Jevtana cycle #1  Return as scheduled for Nadir appointment  Labs with nadir appointment  Return as scheduled for cycle #2   Thank you for choosing [facility] at [facility] to provide your oncology and hematology care.  To afford each patient quality time with our provider, please arrive at least 15 minutes before your scheduled appointment time.    If you have a lab appointment with the [facility] please come in thru the  Main Entrance and check in at the main information desk  You need to re-schedule your appointment should you arrive 10 or more minutes late.  We strive to give you quality time with our providers, and arriving late affects you and other patients whose appointments are after yours.  Also, if you no show three or more times for appointments you may be dismissed from the clinic at the providers discretion.     Again, thank you for choosing [facility].  Our hope is that these requests will decrease the amount of time that you wait before being seen by our physicians.       _____________________________________________________________  Should you have questions after your visit to [facility], please contact our office at [phone] between the hours of 8:30 a.m. and 4:30 p.m.  Voicemails left after 4:30 p.m. will not be returned until the following business day.  For prescription refill requests, have your pharmacy contact our office.       Resources For Cancer Patients and their Caregivers ? American Cancer Society: Can assist with transportation, wigs, general needs, runs Look Good Feel Better.        6094454874 ? Cancer Care: Provides financial assistance, online support groups,  medication/co-pay assistance.  1-800-813-HOPE 916-022-1587) ? [facility] Assists [place] cancer patients and their families through emotional , educational and financial support.  782-533-6466 ? [place] DSS Where to apply for food stamps, Medicaid and utility assistance. (587)635-8618 ? RCATS: Transportation to medical appointments. 320-654-4791 ? Social Security Administration: May apply for disability if have a Stage IV cancer. 769-698-3534 (587)059-8461 ? [employer], Disability and Transit Services: Assists with nutrition, care and transit needs. [facility] Support Programs: @10RELATIVEDAYS @ > Cancer Support Group  2nd Tuesday of the month 1pm-2pm, Journey Room  > Creative Journey  3rd Tuesday of the month 1130am-1pm, Journey Room  > Look Good Feel Better  1st Wednesday of the month 10am-12 noon, Journey Room (Call [facility] to register [phone])

## 2016-05-03 NOTE — Patient Instructions (Signed)
[facility] Discharge Instructions for Patients Receiving Chemotherapy   Beginning January 23rd 2017 lab work for the Cancer Center will be done in the  Main lab at [facility] on 1st floor. If you have a lab appointment with the Cancer Center please come in thru the  Main Entrance and check in at the main information desk   Today you received the following chemotherapy agents Jevtana  To help prevent nausea and vomiting after your treatment, we encourage you to take your nausea medication   If you develop nausea and vomiting, or diarrhea that is not controlled by your medication, call the clinic.  The clinic phone number is [phone]. Office hours are Monday-Friday 8:30am-5:00pm.  BELOW ARE SYMPTOMS THAT SHOULD BE REPORTED IMMEDIATELY:  *FEVER GREATER THAN 101.0 F  *CHILLS WITH OR WITHOUT FEVER  NAUSEA AND VOMITING THAT IS NOT CONTROLLED WITH YOUR NAUSEA MEDICATION  *UNUSUAL SHORTNESS OF BREATH  *UNUSUAL BRUISING OR BLEEDING  TENDERNESS IN MOUTH AND THROAT WITH OR WITHOUT PRESENCE OF ULCERS  *URINARY PROBLEMS  *BOWEL PROBLEMS  UNUSUAL RASH Items with * indicate a potential emergency and should be followed up as soon as possible. If you have an emergency after office hours please contact your primary care physician or go to the nearest emergency department.  Please call the clinic during office hours if you have any questions or concerns.   You may also contact the Patient Navigator at [phone] should you have any questions or need assistance in obtaining follow up care.      Resources For Cancer Patients and their Caregivers ? American Cancer Society: Can assist with transportation, wigs, general needs, runs Look Good Feel Better.        1-888-227-6333 ? Cancer Care: Provides financial assistance, online support groups, medication/co-pay assistance.  1-800-813-HOPE (4673) ? [facility] Assists [place] cancer  patients and their families through emotional , educational and financial support.  336-427-4357 ? [place] DSS Where to apply for food stamps, Medicaid and utility assistance. 336-342-1394 ? RCATS: Transportation to medical appointments. 336-347-2287 ? Social Security Administration: May apply for disability if have a Stage IV cancer. 336-342-7796 1-800-772-1213 ? [place] Aging, Disability and Transit Services: Assists with nutrition, care and transit needs. 336-349-2343          

## 2016-05-03 NOTE — Progress Notes (Signed)
Ist chemotherapy with Jevtana done today per orders. Went over chemo teaching booklet. Went over schedule.  Patient tolerated it well no problems. Vitals stable and discharged patient ambulatory from clinic. Follow up as scheduled.

## 2016-05-04 NOTE — Progress Notes (Signed)
Tried to call patient multiple times to tell him about his appointment tomorrow for neupagen. Called his caregiver [name] and let him know.   Tried to call patient and check on him for his 24 hour follow up. Will ask patient tomorrow.

## 2016-05-05 ENCOUNTER — Encounter (HOSPITAL_BASED_OUTPATIENT_CLINIC_OR_DEPARTMENT_OTHER): Payer: Medicare Other

## 2016-05-05 ENCOUNTER — Encounter (HOSPITAL_COMMUNITY): Payer: Self-pay

## 2016-05-05 VITALS — BP 121/67 | HR 72 | Resp 16

## 2016-05-05 DIAGNOSIS — Z5189 Encounter for other specified aftercare: Secondary | ICD-10-CM | POA: Diagnosis not present

## 2016-05-05 DIAGNOSIS — C61 Malignant neoplasm of prostate: Secondary | ICD-10-CM | POA: Diagnosis not present

## 2016-05-05 MED ORDER — PEGFILGRASTIM INJECTION 6 MG/0.6ML ~~LOC~~
PREFILLED_SYRINGE | SUBCUTANEOUS | Status: AC
  Filled 2016-05-05: qty 0.6

## 2016-05-05 MED ORDER — PEGFILGRASTIM INJECTION 6 MG/0.6ML ~~LOC~~
6.0000 mg | PREFILLED_SYRINGE | Freq: Once | SUBCUTANEOUS | Status: AC
  Administered 2016-05-05: 6 mg via SUBCUTANEOUS

## 2016-05-05 NOTE — Patient Instructions (Signed)
[facility] at [facility] Discharge Instructions  RECOMMENDATIONS MADE BY THE CONSULTANT AND ANY TEST RESULTS WILL BE SENT TO YOUR REFERRING PHYSICIAN.  Neulasta injection given Follow up as scheduled.   Thank you for choosing [facility] at [facility] to provide your oncology and hematology care.  To afford each patient quality time with our provider, please arrive at least 15 minutes before your scheduled appointment time.    If you have a lab appointment with the Cancer Center please come in thru the  Main Entrance and check in at the main information desk  You need to re-schedule your appointment should you arrive 10 or more minutes late.  We strive to give you quality time with our providers, and arriving late affects you and other patients whose appointments are after yours.  Also, if you no show three or more times for appointments you may be dismissed from the clinic at the providers discretion.     Again, thank you for choosing [facility].  Our hope is that these requests will decrease the amount of time that you wait before being seen by our physicians.       _____________________________________________________________  Should you have questions after your visit to [facility], please contact our office at [phone] between the hours of 8:30 a.m. and 4:30 p.m.  Voicemails left after 4:30 p.m. will not be returned until the following business day.  For prescription refill requests, have your pharmacy contact our office.       Resources For Cancer Patients and their Caregivers ? American Cancer Society: Can assist with transportation, wigs, general needs, runs Look Good Feel Better.        1-888-227-6333 ? Cancer Care: Provides financial assistance, online support groups, medication/co-pay assistance.  1-800-813-HOPE (4673) ? [facility] Assists [place] cancer patients  and their families through emotional , educational and financial support.  336-427-4357 ? [place] DSS Where to apply for food stamps, Medicaid and utility assistance. 336-342-1394 ? RCATS: Transportation to medical appointments. 336-347-2287 ? Social Security Administration: May apply for disability if have a Stage IV cancer. 336-342-7796 1-800-772-1213 ? [place] Aging, Disability and Transit Services: Assists with nutrition, care and transit needs. 336-349-2343  Cancer Center Support Programs: @10RELATIVEDAYS@ > Cancer Support Group  2nd Tuesday of the month 1pm-2pm, Journey Room  > Creative Journey  3rd Tuesday of the month 1130am-1pm, Journey Room  > Look Good Feel Better  1st Wednesday of the month 10am-12 noon, Journey Room (Call American Cancer Society to register 1-800-395-5775)   

## 2016-05-05 NOTE — Progress Notes (Signed)
Jeff [name] presents today for injection per MD orders. Neulasta 6mg  administered SQ in right Abdomen. Administration without incident. Patient tolerated well. Vitals stable and discharged from clinic ambulatory. Follow up as scheduled.

## 2016-05-05 NOTE — Progress Notes (Signed)
24 hour follow up done today due to not getting in touch with patient yesterday. Patient states he did well, no problems.

## 2016-05-11 ENCOUNTER — Encounter (HOSPITAL_COMMUNITY): Payer: Self-pay | Admitting: Hematology & Oncology

## 2016-05-11 ENCOUNTER — Encounter (HOSPITAL_COMMUNITY): Payer: Medicare Other

## 2016-05-11 ENCOUNTER — Encounter (HOSPITAL_BASED_OUTPATIENT_CLINIC_OR_DEPARTMENT_OTHER): Payer: Medicare Other | Admitting: Hematology & Oncology

## 2016-05-11 VITALS — BP 120/62 | HR 62 | Temp 97.7°F | Resp 18 | Wt 176.8 lb

## 2016-05-11 DIAGNOSIS — C61 Malignant neoplasm of prostate: Secondary | ICD-10-CM

## 2016-05-11 DIAGNOSIS — Z720 Tobacco use: Secondary | ICD-10-CM | POA: Diagnosis not present

## 2016-05-11 DIAGNOSIS — Z5111 Encounter for antineoplastic chemotherapy: Secondary | ICD-10-CM

## 2016-05-11 DIAGNOSIS — C7951 Secondary malignant neoplasm of bone: Secondary | ICD-10-CM | POA: Diagnosis not present

## 2016-05-11 DIAGNOSIS — M899 Disorder of bone, unspecified: Secondary | ICD-10-CM | POA: Diagnosis not present

## 2016-05-11 DIAGNOSIS — E538 Deficiency of other specified B group vitamins: Secondary | ICD-10-CM

## 2016-05-11 DIAGNOSIS — D649 Anemia, unspecified: Secondary | ICD-10-CM

## 2016-05-11 DIAGNOSIS — Z95828 Presence of other vascular implants and grafts: Secondary | ICD-10-CM

## 2016-05-11 LAB — CBC WITH DIFFERENTIAL/PLATELET
Basophils Absolute: 0.1 10*3/uL (ref 0.0–0.1)
Basophils Relative: 0 %
EOS ABS: 0.1 10*3/uL (ref 0.0–0.7)
EOS PCT: 1 %
HEMATOCRIT: 33 % — AB (ref 39.0–52.0)
Hemoglobin: 11.1 g/dL — ABNORMAL LOW (ref 13.0–17.0)
LYMPHS PCT: 16 %
Lymphs Abs: 3.5 10*3/uL (ref 0.7–4.0)
MCH: 31.5 pg (ref 26.0–34.0)
MCHC: 33.6 g/dL (ref 30.0–36.0)
MCV: 93.8 fL (ref 78.0–100.0)
MONOS PCT: 7 %
Monocytes Absolute: 1.6 10*3/uL — ABNORMAL HIGH (ref 0.1–1.0)
NEUTROS PCT: 76 %
Neutro Abs: 16.6 10*3/uL — ABNORMAL HIGH (ref 1.7–7.7)
Platelets: 249 10*3/uL (ref 150–400)
RBC: 3.52 MIL/uL — AB (ref 4.22–5.81)
RDW: 14.7 % (ref 11.5–15.5)
WBC MORPHOLOGY: INCREASED
WBC: 21.8 10*3/uL — AB (ref 4.0–10.5)

## 2016-05-11 LAB — COMPREHENSIVE METABOLIC PANEL
ALK PHOS: 84 U/L (ref 38–126)
ALT: 16 U/L — AB (ref 17–63)
AST: 21 U/L (ref 15–41)
Albumin: 3.5 g/dL (ref 3.5–5.0)
Anion gap: 4 — ABNORMAL LOW (ref 5–15)
BILIRUBIN TOTAL: 0.3 mg/dL (ref 0.3–1.2)
BUN: 11 mg/dL (ref 6–20)
CALCIUM: 8.4 mg/dL — AB (ref 8.9–10.3)
CO2: 25 mmol/L (ref 22–32)
CREATININE: 1.7 mg/dL — AB (ref 0.61–1.24)
Chloride: 105 mmol/L (ref 101–111)
GFR, EST AFRICAN AMERICAN: 46 mL/min — AB (ref >60)
GFR, EST NON AFRICAN AMERICAN: 39 mL/min — AB (ref >60)
Glucose, Bld: 96 mg/dL (ref 65–99)
Potassium: 4.3 mmol/L (ref 3.5–5.1)
Sodium: 134 mmol/L — ABNORMAL LOW (ref 135–145)
TOTAL PROTEIN: 7.5 g/dL (ref 6.5–8.1)

## 2016-05-11 LAB — PSA: PSA: 3.96 ng/mL (ref 0.00–4.00)

## 2016-05-11 MED ORDER — OXYCODONE HCL 5 MG PO TABS: ORAL_TABLET | ORAL | 0 refills | Status: DC

## 2016-05-11 NOTE — Patient Instructions (Signed)
[place] at [facility] Discharge Instructions  RECOMMENDATIONS MADE BY THE CONSULTANT AND ANY TEST RESULTS WILL BE SENT TO YOUR REFERRING PHYSICIAN.  You were seen today by Dr. Whitney Muse. Continue Xgeva injections. Return on 1/29 for chemo, follow up and labs.   Thank you for choosing [place] at [facility] to provide your oncology and hematology care.  To afford each patient quality time with our provider, please arrive at least 15 minutes before your scheduled appointment time.    If you have a lab appointment with the [facility] please come in thru the  Main Entrance and check in at the main information desk  You need to re-schedule your appointment should you arrive 10 or more minutes late.  We strive to give you quality time with our providers, and arriving late affects you and other patients whose appointments are after yours.  Also, if you no show three or more times for appointments you may be dismissed from the clinic at the providers discretion.     Again, thank you for choosing [facility].  Our hope is that these requests will decrease the amount of time that you wait before being seen by our physicians.       _____________________________________________________________  Should you have questions after your visit to [facility], please contact our office at [phone] between the hours of 8:30 a.m. and 4:30 p.m.  Voicemails left after 4:30 p.m. will not be returned until the following business day.  For prescription refill requests, have your pharmacy contact our office.       Resources For Cancer Patients and their Caregivers ? American Cancer Society: Can assist with transportation, wigs, general needs, runs Look Good Feel Better.        407-652-4701 ? Cancer Care: Provides financial assistance, online support groups, medication/co-pay assistance.  1-800-813-HOPE 564-777-2936) ? [facility] Assists [place] cancer patients and their families through emotional , educational and financial support.  253-334-9422 ? [place] DSS Where to apply for food stamps, Medicaid and utility assistance. (403) 425-2985 ? RCATS: Transportation to medical appointments. (517) 568-4097 ? Social Security Administration: May apply for disability if have a Stage IV cancer. (561)229-2587 (470)664-4457 ? [employer], Disability and Transit Services: Assists with nutrition, care and transit needs. [place] Support Programs: @10RELATIVEDAYS @ > Cancer Support Group  2nd Tuesday of the month 1pm-2pm, Journey Room  > Creative Journey  3rd Tuesday of the month 1130am-1pm, Journey Room  > Look Good Feel Better  1st Wednesday of the month 10am-12 noon, Journey Room (Call Dawn to register [phone])

## 2016-05-11 NOTE — Progress Notes (Signed)
No PCP Per Patient No address on file  Stage IV Adenocarcinoma of the prostate, Hormone Sensitive Disease Bilateral orchiectomy 01/2014 Taxotere/Xgeva started 02/2014 Bilateral nephrostomy tube placement 01/2014 Nephrostomy tube removal 07/23/2014    Prostate cancer metastatic to multiple sites ([place])   01/26/2014 Tumor Marker    PSA > 5000      01/28/2014 Initial Biopsy    Metastatic adenocarcinoma of prostate      02/05/2014 Surgery    Bilateral orchiectomy by Dr. Junious [name]      02/26/2014 Tumor Marker    PSA = 399.5      03/14/2014 Tumor Marker    PSA= 89.51      03/14/2014 - 06/26/2014 Chemotherapy    Docetaxel 75 mg/kg every 21 days x 6 cycles      04/24/2014 Tumor Marker    PSA- 19.89      07/23/2014 Procedure    Nephrostomy tube removed by IR, Dr. Geroge [name]      07/24/2014 Tumor Marker    PSA= 6.15      10/16/2014 Tumor Marker    PSA: 3.54       01/08/2015 Tumor Marker    PSA: 2.13       01/17/2015 Imaging    CT CAP-  Massive pelvic and retroperitoneal lymphadenopathy noted on the prior study has nearly completely resolved, now with only a small amount of residual amorphous soft tissue predominantly around the the infrarenal abdominal aorta.      01/17/2015 Imaging    Bone scan- Widespread osseous metastatic disease with multiple foci of increased activity throughout the skeleton, corresponding with the findings on the prior CT from October, 2015.      04/11/2015 Tumor Marker    PSA: 1.87       07/21/2015 Imaging    Bone scan- Bony metastatic disease again noted at multiple sites, stable from prior study. No progression of bony metastatic disease is demonstrable on this study.      07/25/2015 Imaging    CT CAP- Stable matted soft tissue density in the retroperitoneum and extraperitoneal pelvis consistent with treated disease. No recurrent lymphadenopathy.  No pulmonary metastatic disease. Diffuse stable sclerotic metastatic bone disease.      01/14/2016 Imaging    Bone scan-  Multiple sites of abnormal increased tracer localization involving BILATERAL ribs, T11, T12, questionably RIGHT scapula and LEFT iliac bone suspicious for osseous metastases.      01/23/2016 Imaging    CT CAP- 1. Similar widespread osseous metastasis. 2. Similar soft tissue thickening within the retroperitoneum of the abdomen. Improved left pelvic side wall soft tissue thickening. These are consistent with sites of treated disease. No well-defined adenopathy. 3. No new sites of disease.      05/03/2016 -  Chemotherapy    Jevtana every 21 days        INTERVAL HISTORY: Jeff [name] 71 y.o. male returns for follow-up of his hormone refractory stage IV prostate cancer. At time of diagnosis he had stage IV disease, treated with orchiectomy and taxotere from 03/14/2014- 06/26/2014 with excellent response. Unfortunately PSA has begun to rise and has now been started on jevtana. Jeff [name] has an excellent PS.  [name] is unaccompanied. He is here today for follow up after his first chemotherapy treatment.    I personally reviewed and went over labs with the patient.  He asked if his neulasta shot was as strong as what he has been taking.   Denies nausea and vomiting,  dysuria.  He stated his bones in his left leg were hurting. Not intolerable. He is still able to be active. He is sleeping.   Eating well.  No nausea, vomiting or diarrhea.  MEDICAL HISTORY: Past Medical History:  Diagnosis Date  . Acute renal failure (Jeff [name]) 01/26/2014  . Alcohol abuse   . Anemia of chronic disease 01/26/2014  . Bilateral hydronephrosis 01/26/2014  . History of cocaine abuse 01/26/2014  . Homelessness 01/31/2014  . 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 Sepsis (Jeff [name])   . UTI (lower urinary tract infection)     has Bone lesion; Acute renal failure (Jeff [name]); Bilateral hydronephrosis; Anemia of chronic disease; History of cocaine abuse;  Prostate cancer metastatic to multiple sites [name]); Homelessness; Health care maintenance; Sepsis (Jeff [name]); UTI (lower urinary tract infection); Malnutrition of moderate degree (Jeff [name]); Bacteremia; Pyrexia; and B12 deficiency on his problem list.      has No Known Allergies.  [name] had no medications administered during this visit.  SURGICAL HISTORY: Past Surgical History:  Procedure Laterality Date  . ORCHIECTOMY Bilateral 02/05/2014   Procedure: BILATERAL ORCHIECTOMY;  Surgeon: Jeff Aloe, MD;  Location: [facility];  Service: Urology;  Laterality: Bilateral;  . PERCUTANEOUS NEPHROSTOMY Bilateral    IR Dr. Junious [name] changed on 04/22/2014  . PORTACATH PLACEMENT Right 03/12/14    SOCIAL HISTORY: Social History   Social History  . Marital status: Single    Spouse name: N/A  . Number of children: 1  . Years of education: 9th   Occupational History  . disabilty [name]   Social History Main Topics  . Smoking status: Current Every Day Smoker    Packs/day: 0.25    Years: 55.00    Types: Cigarettes  . Smokeless tobacco: Never Used  . Alcohol use No     Comment: none for at least 7 -8 years  . Drug use: Yes    Types: Cocaine     Comment: last used Cocaine 01/25/14  . Sexual activity: Yes   Other Topics Concern  . Not on file   Social History Narrative   Lives at [employer] 9th grade   1 son, lives in [place], [place]   Can read and write in native language             FAMILY HISTORY: Family History  Problem Relation Age of Onset  . Cancer Brother 60    Review of Systems  Constitutional: Negative.        Appetite is good  HENT: Negative.   Eyes: Negative.   Respiratory: Negative.   Cardiovascular: Negative.   Gastrointestinal: Negative.  Negative for nausea and vomiting.  Genitourinary: Negative.  Negative for dysuria.  Musculoskeletal: Negative.        Bones in his left leg hurting.  Skin: Negative.   Neurological:  Negative.   Endo/Heme/Allergies: Negative.   Psychiatric/Behavioral: Negative.   All other systems reviewed and are negative. 14 point review of systems was performed and is negative except as detailed under history of present illness and above   PHYSICAL EXAMINATION  ECOG PERFORMANCE STATUS: 0 - Asymptomatic  Vitals:   05/11/16 1008  BP: 120/62  Pulse: 62  Resp: 18  Temp: 97.7 F (36.5 C)    Physical Exam  Constitutional: He is oriented to person, place, and time and well-developed, well-nourished, and in no distress.  HENT:  Head: Normocephalic and atraumatic.  Mouth/Throat: No oropharyngeal exudate.  Poor dentition  Eyes: Conjunctivae  and EOM are normal. Pupils are equal, round, and reactive to light. No scleral icterus.  Neck: Normal range of motion. Neck supple.  Cardiovascular: Normal rate, regular rhythm and normal heart sounds.   Pulmonary/Chest: Effort normal and breath sounds normal.  Abdominal: Soft. Bowel sounds are normal. He exhibits no distension and no mass. There is no tenderness. There is no rebound and no guarding.  Musculoskeletal: Normal range of motion.  Lymphadenopathy:    He has no cervical adenopathy.  Neurological: He is alert and oriented to person, place, and time. No cranial nerve deficit. Gait normal.  Skin: Skin is warm and dry.  Psychiatric: Mood, memory, affect and judgment normal.  Nursing note and vitals reviewed.      LABORATORY DATA: I have reviewed the labs below. Results for HARVEY, MATLACK (MRN [id number]) as of 05/27/2016 12:53  Ref. Range 05/11/2016 09:01  Sodium Latest Ref Range: 135 - 145 mmol/L 134 (L)  Potassium Latest Ref Range: 3.5 - 5.1 mmol/L 4.3  Chloride Latest Ref Range: 101 - 111 mmol/L 105  CO2 Latest Ref Range: 22 - 32 mmol/L 25  Glucose Latest Ref Range: 65 - 99 mg/dL 96  BUN Latest Ref Range: 6 - 20 mg/dL 11  Creatinine Latest Ref Range: 0.61 - 1.24 mg/dL 1.70 (H)  Calcium Latest Ref Range: 8.9 - 10.3 mg/dL 8.4  (L)  Anion [name] Latest Ref Range: 5 - 15  4 (L)  Alkaline Phosphatase Latest Ref Range: 38 - 126 U/L 84  Albumin Latest Ref Range: 3.5 - 5.0 g/dL 3.5  AST Latest Ref Range: 15 - 41 U/L 21  ALT Latest Ref Range: 17 - 63 U/L 16 (L)  Total Protein Latest Ref Range: 6.5 - 8.1 g/dL 7.5  Total Bilirubin Latest Ref Range: 0.3 - 1.2 mg/dL 0.3  EGFR (African American) Latest Ref Range: >60 mL/min 46 (L)  EGFR (Non-African Amer.) Latest Ref Range: >60 mL/min 39 (L)  WBC Latest Ref Range: 4.0 - 10.5 K/uL 21.8 (H)  RBC Latest Ref Range: 4.22 - 5.81 MIL/uL 3.52 (L)  Hemoglobin Latest Ref Range: 13.0 - 17.0 g/dL 11.1 (L)  HCT Latest Ref Range: 39.0 - 52.0 % 33.0 (L)  MCV Latest Ref Range: 78.0 - 100.0 fL 93.8  MCH Latest Ref Range: 26.0 - 34.0 pg 31.5  MCHC Latest Ref Range: 30.0 - 36.0 g/dL 33.6  RDW Latest Ref Range: 11.5 - 15.5 % 14.7  Platelets Latest Ref Range: 150 - 400 K/uL 249  Neutrophils Latest Units: % 76  Lymphocytes Latest Units: % 16  Monocytes Relative Latest Units: % 7  Eosinophil Latest Units: % 1  Basophil Latest Units: % 0  NEUT# Latest Ref Range: 1.7 - 7.7 K/uL 16.6 (H)  Lymphocyte # Latest Ref Range: 0.7 - 4.0 K/uL 3.5  Monocyte # Latest Ref Range: 0.1 - 1.0 K/uL 1.6 (H)  Eosinophils Absolute Latest Ref Range: 0.0 - 0.7 K/uL 0.1  Basophils Absolute Latest Ref Range: 0.0 - 0.1 K/uL 0.1    RADIOLOGY: I have personally reviewed the radiological images as listed and agreed with the findings in the report. Study Result   CLINICAL DATA:  Followup of prostate cancer. Status post chemotherapy and radiation therapy. Acute renal failure. Orchiectomy. Rising PSA.  EXAM: CT CHEST, ABDOMEN, AND PELVIS WITH CONTRAST  TECHNIQUE: Multidetector CT imaging of the chest, abdomen and pelvis was performed following the standard protocol during bolus administration of intravenous contrast.  CONTRAST:  52m ISOVUE-300 IOPAMIDOL (ISOVUE-300) INJECTION 61%  COMPARISON:  07/25/2015  FINDINGS: CT CHEST FINDINGS  Cardiovascular: Mild cardiomegaly. Multivessel coronary artery atherosclerosis. No central pulmonary embolism, on this non-dedicated study. Right-sided Port-A-Cath which terminates at the low SVC.  Mediastinum/Nodes: No supraclavicular adenopathy. No mediastinal or hilar adenopathy. Tiny hiatal hernia.  Lungs/Pleura: No pleural fluid. Centrilobular emphysema. Lower lobe predominant bronchial wall thickening. No findings of pulmonary metastasis. Redemonstration of peripheral predominant interstitial thickening with mild architectural distortion most apparent at the lung bases. minimal subpleural sparing. No honeycombing.  Musculoskeletal: Similar widespread sclerotic osseous metastasis. Mild T3 superior endplate compression deformity is similar.  CT ABDOMEN PELVIS FINDINGS  Hepatobiliary: Normal liver. Normal gallbladder, without biliary ductal dilatation.  Pancreas: Low-density pancreatic uncinate process 9 mm lesion on image 73/series 3 is similar to on the prior exam and likely present back on 01/26/2014. No main pancreatic duct dilatation or acute pancreatitis.  Spleen: Normal in size, without focal abnormality.  Adrenals/Urinary Tract: Normal adrenal glands. Mild right and moderate left renal cortical thinning. No hydronephrosis. Normal urinary bladder.  Stomach/Bowel: Normal remainder of the stomach. Normal colon, appendix, and terminal ileum. Normal small bowel.  Vascular/Lymphatic: Aortic and branch vessel atherosclerosis. Periaortic soft tissue thickening remains, including on image 82/series 3. No well-defined adenopathy. No pelvic sidewall adenopathy. There is residual soft tissue thickening, including on the left pelvic sidewall on image 103/series 3 this measures on the order of 1.1 cm today versus 1.6 cm on the prior exam (when remeasured).  Reproductive: Mild prostatomegaly.  Other: No significant  free fluid.  Musculoskeletal: Widespread sclerotic metastasis, similar in distribution and severity. Index right sacral lesion measures 1.4 cm on image 91/series 3 and is similar.  IMPRESSION: 1. Similar widespread osseous metastasis. 2. Similar soft tissue thickening within the retroperitoneum of the abdomen. Improved left pelvic side wall soft tissue thickening. These are consistent with sites of treated disease. No well-defined adenopathy. 3. No new sites of disease. 4.  Coronary artery atherosclerosis. Aortic atherosclerosis. 5. Grossly similar interstitial lung disease which could be idiopathic or related to chemotherapy. 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 6. 9 mm pancreatic cystic lesion is most likely a pseudocyst and is similar. Indolent neoplasm could look similar. Recommend attention on follow-up.   Electronically Signed   By: Abigail Miyamoto M.D.   On: 01/23/2016 08:41      ASSESSMENT and THERAPY PLAN:   Stage IV prostate cancer, hormone refractory disease. HX taxotere HX Bilateral nephrostomy tubes Bilateral orchiectomy History of polysubstance abuse  Anemia, multifactorial  Tobacco Use   71 year old male with stage IV adenocarcinoma of the prostate, hormone refractory disease. Originally diagnosed in late 2015.   He had a bilateral orchiectomy. He continues on XGEVA monthly for bone metastases. He was again advised to take calcium and vitamin D daily.   He is on Jevtana. Did well with therapy. WE reviewed all of his treatment options and he was advised that he needs to access them all during the course of his disease. While he is doing well he opted for chemotherapy.  Labs reviewed. Results noted above.   He complained about his Neulasta shot because he didn't like it. I talked to him about switching to neulasta Onpro. He may do better with t I refilled his oxycodone.    He will follow up on 05/24/16.Will continue with current labs  including PSA.   All questions were answered. The patient knows to call the clinic with any problems, questions or concerns. We can certainly see the patient much sooner if necessary.  This document  serves as a record of services personally performed by Ancil Linsey, MD. It was created on her behalf by Shirlean Mylar, a trained medical scribe. The creation of this record is based on the scribe's personal observations and the provider's statements to them. This document has been checked and approved by the attending provider.   I have reviewed the above documentation for accuracy and completeness, and I agree with the above.  Molli Hazard, MD  05/27/2016

## 2016-05-24 ENCOUNTER — Encounter (HOSPITAL_BASED_OUTPATIENT_CLINIC_OR_DEPARTMENT_OTHER): Payer: Medicare Other

## 2016-05-24 ENCOUNTER — Encounter (HOSPITAL_BASED_OUTPATIENT_CLINIC_OR_DEPARTMENT_OTHER): Payer: Medicare Other | Admitting: Oncology

## 2016-05-24 ENCOUNTER — Ambulatory Visit (HOSPITAL_COMMUNITY): Payer: Medicare Other | Admitting: Hematology & Oncology

## 2016-05-24 ENCOUNTER — Encounter (HOSPITAL_COMMUNITY): Payer: Self-pay | Admitting: Oncology

## 2016-05-24 VITALS — BP 115/71 | HR 64 | Temp 97.5°F | Resp 18

## 2016-05-24 DIAGNOSIS — Z5111 Encounter for antineoplastic chemotherapy: Secondary | ICD-10-CM

## 2016-05-24 DIAGNOSIS — C7951 Secondary malignant neoplasm of bone: Secondary | ICD-10-CM

## 2016-05-24 DIAGNOSIS — C61 Malignant neoplasm of prostate: Secondary | ICD-10-CM

## 2016-05-24 DIAGNOSIS — M899 Disorder of bone, unspecified: Secondary | ICD-10-CM | POA: Diagnosis not present

## 2016-05-24 LAB — CBC WITH DIFFERENTIAL/PLATELET
BASOS ABS: 0 10*3/uL (ref 0.0–0.1)
BASOS PCT: 0 %
EOS ABS: 0 10*3/uL (ref 0.0–0.7)
EOS PCT: 1 %
HCT: 33.6 % — ABNORMAL LOW (ref 39.0–52.0)
HEMOGLOBIN: 11.2 g/dL — AB (ref 13.0–17.0)
Lymphocytes Relative: 28 %
Lymphs Abs: 1.5 10*3/uL (ref 0.7–4.0)
MCH: 31.1 pg (ref 26.0–34.0)
MCHC: 33.3 g/dL (ref 30.0–36.0)
MCV: 93.3 fL (ref 78.0–100.0)
Monocytes Absolute: 0.8 10*3/uL (ref 0.1–1.0)
Monocytes Relative: 14 %
NEUTROS PCT: 57 %
Neutro Abs: 3.1 10*3/uL (ref 1.7–7.7)
PLATELETS: 289 10*3/uL (ref 150–400)
RBC: 3.6 MIL/uL — AB (ref 4.22–5.81)
RDW: 15.2 % (ref 11.5–15.5)
WBC: 5.4 10*3/uL (ref 4.0–10.5)

## 2016-05-24 LAB — COMPREHENSIVE METABOLIC PANEL
ALBUMIN: 3.6 g/dL (ref 3.5–5.0)
ALK PHOS: 47 U/L (ref 38–126)
ALT: 18 U/L (ref 17–63)
AST: 22 U/L (ref 15–41)
Anion gap: 7 (ref 5–15)
BUN: 20 mg/dL (ref 6–20)
CHLORIDE: 103 mmol/L (ref 101–111)
CO2: 24 mmol/L (ref 22–32)
CREATININE: 1.77 mg/dL — AB (ref 0.61–1.24)
Calcium: 8.8 mg/dL — ABNORMAL LOW (ref 8.9–10.3)
GFR calc non Af Amer: 37 mL/min — ABNORMAL LOW (ref >60)
GFR, EST AFRICAN AMERICAN: 43 mL/min — AB (ref >60)
GLUCOSE: 93 mg/dL (ref 65–99)
Potassium: 4.5 mmol/L (ref 3.5–5.1)
SODIUM: 134 mmol/L — AB (ref 135–145)
Total Bilirubin: 0.5 mg/dL (ref 0.3–1.2)
Total Protein: 7.7 g/dL (ref 6.5–8.1)

## 2016-05-24 LAB — PSA: PSA: 3.92 ng/mL (ref 0.00–4.00)

## 2016-05-24 MED ORDER — DEXTROSE 5 % IV SOLN
20.0000 mg/m2 | Freq: Once | INTRAVENOUS | Status: AC
  Administered 2016-05-24: 41 mg via INTRAVENOUS
  Filled 2016-05-24: qty 4.1

## 2016-05-24 MED ORDER — SODIUM CHLORIDE 0.9 % IV SOLN: 10.0000 mg | Freq: Once | INTRAVENOUS | Status: DC

## 2016-05-24 MED ORDER — OXYCODONE HCL 5 MG PO TABS: ORAL_TABLET | ORAL | 0 refills | Status: DC

## 2016-05-24 MED ORDER — DEXAMETHASONE SODIUM PHOSPHATE 10 MG/ML IJ SOLN
10.0000 mg | Freq: Once | INTRAMUSCULAR | Status: AC
  Administered 2016-05-24: 10 mg via INTRAVENOUS
  Filled 2016-05-24: qty 1

## 2016-05-24 MED ORDER — FAMOTIDINE IN NACL 20-0.9 MG/50ML-% IV SOLN
20.0000 mg | Freq: Once | INTRAVENOUS | Status: AC
  Administered 2016-05-24: 20 mg via INTRAVENOUS
  Filled 2016-05-24: qty 50

## 2016-05-24 MED ORDER — DIPHENHYDRAMINE HCL 50 MG/ML IJ SOLN
25.0000 mg | Freq: Once | INTRAMUSCULAR | Status: AC
Start: 2016-05-24 — End: 2016-05-24
  Administered 2016-05-24: 25 mg via INTRAVENOUS
  Filled 2016-05-24: qty 1

## 2016-05-24 MED ORDER — SODIUM CHLORIDE 0.9% FLUSH
10.0000 mL | INTRAVENOUS | Status: DC | PRN
  Administered 2016-05-24: 10 mL
  Filled 2016-05-24: qty 10

## 2016-05-24 MED ORDER — OXYCODONE HCL 5 MG PO TABS
ORAL_TABLET | ORAL | 0 refills | Status: DC
Start: 2016-05-24 — End: 2016-05-24

## 2016-05-24 MED ORDER — SODIUM CHLORIDE 0.9 % IV SOLN
Freq: Once | INTRAVENOUS | Status: AC
  Administered 2016-05-24: 10:00:00 via INTRAVENOUS

## 2016-05-24 MED ORDER — PEGFILGRASTIM 6 MG/0.6ML ~~LOC~~ PSKT
6.0000 mg | PREFILLED_SYRINGE | Freq: Once | SUBCUTANEOUS | Status: AC
  Administered 2016-05-24: 6 mg via SUBCUTANEOUS
  Filled 2016-05-24: qty 0.6

## 2016-05-24 MED ORDER — HEPARIN SOD (PORK) LOCK FLUSH 100 UNIT/ML IV SOLN
500.0000 [IU] | Freq: Once | INTRAVENOUS | Status: AC | PRN
  Administered 2016-05-24: 500 [IU]
  Filled 2016-05-24 (×2): qty 5

## 2016-05-24 NOTE — Patient Instructions (Signed)
[facility] Discharge Instructions for Patients Receiving Chemotherapy   Beginning January 23rd 2017 lab work for the Cancer Center will be done in the  Main lab at [facility] on 1st floor. If you have a lab appointment with the Cancer Center please come in thru the  Main Entrance and check in at the main information desk   Today you received the following chemotherapy agents   To help prevent nausea and vomiting after your treatment, we encourage you to take your nausea medication     If you develop nausea and vomiting, or diarrhea that is not controlled by your medication, call the clinic.  The clinic phone number is [phone]. Office hours are Monday-Friday 8:30am-5:00pm.  BELOW ARE SYMPTOMS THAT SHOULD BE REPORTED IMMEDIATELY:  *FEVER GREATER THAN 101.0 F  *CHILLS WITH OR WITHOUT FEVER  NAUSEA AND VOMITING THAT IS NOT CONTROLLED WITH YOUR NAUSEA MEDICATION  *UNUSUAL SHORTNESS OF BREATH  *UNUSUAL BRUISING OR BLEEDING  TENDERNESS IN MOUTH AND THROAT WITH OR WITHOUT PRESENCE OF ULCERS  *URINARY PROBLEMS  *BOWEL PROBLEMS  UNUSUAL RASH Items with * indicate a potential emergency and should be followed up as soon as possible. If you have an emergency after office hours please contact your primary care physician or go to the nearest emergency department.  Please call the clinic during office hours if you have any questions or concerns.   You may also contact the Patient Navigator at [phone] should you have any questions or need assistance in obtaining follow up care.      Resources For Cancer Patients and their Caregivers ? American Cancer Society: Can assist with transportation, wigs, general needs, runs Look Good Feel Better.        1-888-227-6333 ? Cancer Care: Provides financial assistance, online support groups, medication/co-pay assistance.  1-800-813-HOPE (4673) ? [facility] Assists [place] cancer  patients and their families through emotional , educational and financial support.  336-427-4357 ? [place] DSS Where to apply for food stamps, Medicaid and utility assistance. 336-342-1394 ? [facility]: Transportation to medical appointments. 336-347-2287 ? Social Security Administration: May apply for disability if have a Stage IV cancer. 336-342-7796 1-800-772-1213 ? [place] Aging, Disability and Transit Services: Assists with nutrition, care and transit needs. 336-349-2343         

## 2016-05-24 NOTE — Progress Notes (Signed)
Labs reviewed with PA, proceed with treatment.  Chemotherapy given today per orders. Patient tolerated it well. Vitals stable and discharged ambulatory

## 2016-05-24 NOTE — Assessment & Plan Note (Addendum)
Stage IV adenocarcinoma of the prostate, hormone sensitive disease, with osseous involvement; S/P Docetaxel x 6 cycles 03/14/2014- 06/26/2014 with an excellent response.  Rising PSA resulted in restart of therapy with Jevtana, 05/03/2016.  Oncology history is updated.  Pre-treatment labs today: CBC diff, CMET, PSA.  I personally reviewed and went over laboratory results with the patient.  The results are noted within this dictation.  Labs satisfy treatment parameters.  Treatment today.  I have refilled his pain medications after reviewing the [facility] Controlled Substance Reporting System.  This can be filled on 06/01/2016 (which is one week early due to increased pain with Neulasta).  Xgeva and B12 injection as scheduled on 05/31/2016.  Return in 3 weeks for follow-up and next cycle of chemotherapy.

## 2016-05-24 NOTE — Progress Notes (Signed)
No PCP Per Patient No address on file  Prostate cancer metastatic to multiple sites [facility]) - Plan: oxyCODONE (OXY IR/ROXICODONE) 5 MG immediate release tablet, DISCONTINUED: oxyCODONE (OXY IR/ROXICODONE) 5 MG immediate release tablet  CURRENT THERAPY: Jevtana with Neulasta support every 21 days beginning on 05/03/2016.  Xgeva monthly to reduce risk of SRE.  INTERVAL HISTORY: Jeff [name] 71 y.o. male returns for followup of Stage IV adenocarcinoma of the prostate, hormone sensitive disease, with osseous involvement; S/P Docetaxel x 6 cycles 03/14/2014- 06/26/2014 with an excellent response.  Now with slowly rising PSA (doubling time ~ 9 months) resulting in re-initiation of chemotherapy consisting of Jevtana beginning on 05/03/2016.    Prostate cancer metastatic to multiple sites [facility])   01/26/2014 Tumor Marker    PSA > 5000      01/28/2014 Initial Biopsy    Metastatic adenocarcinoma of prostate      02/05/2014 Surgery    Bilateral orchiectomy by Dr. Junious Silk      02/26/2014 Tumor Marker    PSA = 399.5      03/14/2014 Tumor Marker    PSA= 89.51      03/14/2014 - 06/26/2014 Chemotherapy    Docetaxel 75 mg/kg every 21 days x 6 cycles      04/24/2014 Tumor Marker    PSA- 19.89      07/23/2014 Procedure    Nephrostomy tube removed by IR, Dr. Geroge Baseman      07/24/2014 Tumor Marker    PSA= 6.15      10/16/2014 Tumor Marker    PSA: 3.54       01/08/2015 Tumor Marker    PSA: 2.13       01/17/2015 Imaging    CT CAP-  Massive pelvic and retroperitoneal lymphadenopathy noted on the prior study has nearly completely resolved, now with only a small amount of residual amorphous soft tissue predominantly around the the infrarenal abdominal aorta.      01/17/2015 Imaging    Bone scan- Widespread osseous metastatic disease with multiple foci of increased activity throughout the skeleton, corresponding with the findings on the prior CT from October, 2015.      04/11/2015  Tumor Marker    PSA: 1.87       07/21/2015 Imaging    Bone scan- Bony metastatic disease again noted at multiple sites, stable from prior study. No progression of bony metastatic disease is demonstrable on this study.      07/25/2015 Imaging    CT CAP- Stable matted soft tissue density in the retroperitoneum and extraperitoneal pelvis consistent with treated disease. No recurrent lymphadenopathy.  No pulmonary metastatic disease. Diffuse stable sclerotic metastatic bone disease.      01/14/2016 Imaging    Bone scan-  Multiple sites of abnormal increased tracer localization involving BILATERAL ribs, T11, T12, questionably RIGHT scapula and LEFT iliac bone suspicious for osseous metastases.      01/23/2016 Imaging    CT CAP- 1. Similar widespread osseous metastasis. 2. Similar soft tissue thickening within the retroperitoneum of the abdomen. Improved left pelvic side wall soft tissue thickening. These are consistent with sites of treated disease. No well-defined adenopathy. 3. No new sites of disease.      05/03/2016 -  Chemotherapy    Jevtana every 21 days        He is tolerating treatment well.  He denies any issues with treatment.  Denies any nausea or vomiting. He did note some discomfort with the last injection  with his first cycle of chemotherapy. He was supposed to have Neulasta onpro, but this was not built into his treatment plan. Therefore, today, he'll receive Neulasta onpro.    He notes discomfort with the physical injection itself. Not side effects of Neulasta.  He notes an increase in his pain medication needs. He thinks he has enough for the next 2 weeks, but I'll provide a prescription refill 1 week early if needed. He is very compliant with this medication.  Review of Systems  Constitutional: Negative.  Negative for chills, fever and weight loss.  HENT: Negative.   Eyes: Negative.   Respiratory: Negative.  Negative for cough.   Cardiovascular: Negative.  Negative  for chest pain.  Gastrointestinal: Negative.  Negative for constipation, diarrhea, nausea and vomiting.  Genitourinary: Negative.   Musculoskeletal: Negative.   Skin: Negative.   Neurological: Negative.  Negative for weakness.  Endo/Heme/Allergies: Negative.   Psychiatric/Behavioral: Negative.     Past Medical History:  Diagnosis Date  . Acute renal failure ([place]) 01/26/2014  . Alcohol abuse   . Anemia of chronic disease 01/26/2014  . Bilateral hydronephrosis 01/26/2014  . History of cocaine abuse 01/26/2014  . Homelessness 01/31/2014  . Prostate cancer metastatic to multiple sites ([place]) 01/30/2014  . Sepsis ([place])   . UTI (lower urinary tract infection)     Past Surgical History:  Procedure Laterality Date  . ORCHIECTOMY Bilateral 02/05/2014   Procedure: BILATERAL ORCHIECTOMY;  Surgeon: Festus Aloe, MD;  Location: [facility];  Service: Urology;  Laterality: Bilateral;  . PERCUTANEOUS NEPHROSTOMY Bilateral    IR Dr. Junious Silk changed on 04/22/2014  . PORTACATH PLACEMENT Right 03/12/14    Family History  Problem Relation Age of Onset  . Cancer Brother 4    Social History   Social History  . Marital status: Single    Spouse name: N/A  . Number of children: 1  . Years of education: 9th   Occupational History  . disabilty [employer]   Social History Main Topics  . Smoking status: Current Every Day Smoker    Packs/day: 0.25    Years: 55.00    Types: Cigarettes  . Smokeless tobacco: Never Used  . Alcohol use No     Comment: none for at least 7 -8 years  . Drug use: Yes    Types: Cocaine     Comment: last used Cocaine 01/25/14  . Sexual activity: Yes   Other Topics Concern  . None   Social History Narrative   Lives at [employer] 9th grade   1 son, lives in [place], [place]   Can read and write in native language              PHYSICAL EXAMINATION  ECOG PERFORMANCE STATUS: 0 - Asymptomatic  Vitals:   05/24/16 0943  BP: 117/63    Pulse: 65  Resp: 18  Temp: 97.5 F (36.4 C)     GENERAL:alert, no distress, well nourished, well developed, comfortable, cooperative, smiling and unaccompanied. SKIN: skin color, texture, turgor are normal, no rashes or significant lesions HEAD: Normocephalic, No masses, lesions, tenderness or abnormalities EYES: normal, EOMI, Conjunctiva are pink and non-injected EARS: External ears normal OROPHARYNX:lips, buccal mucosa, and tongue normal and mucous membranes are moist  NECK: supple, no adenopathy, trachea midline LYMPH:  no palpable lymphadenopathy BREAST:not examined LUNGS: clear to auscultation  HEART: regular rate & rhythm ABDOMEN:abdomen soft and normal bowel sounds BACK: Back symmetric, no curvature. EXTREMITIES:less then 2 second  capillary refill, no joint deformities, effusion, or inflammation, no skin discoloration, no cyanosis  NEURO: alert & oriented x 3 with fluent speech, no focal motor/sensory deficits, gait normal   LABORATORY DATA: CBC    Component Value Date/Time   WBC 5.4 05/24/2016 0936   RBC 3.60 (L) 05/24/2016 0936   HGB 11.2 (L) 05/24/2016 0936   HCT 33.6 (L) 05/24/2016 0936   PLT 289 05/24/2016 0936   MCV 93.3 05/24/2016 0936   MCH 31.1 05/24/2016 0936   MCHC 33.3 05/24/2016 0936   RDW 15.2 05/24/2016 0936   LYMPHSABS 1.5 05/24/2016 0936   MONOABS 0.8 05/24/2016 0936   EOSABS 0.0 05/24/2016 0936   BASOSABS 0.0 05/24/2016 0936      Chemistry      Component Value Date/Time   NA 134 (L) 05/11/2016 0901   K 4.3 05/11/2016 0901   CL 105 05/11/2016 0901   CO2 25 05/11/2016 0901   BUN 11 05/11/2016 0901   CREATININE 1.70 (H) 05/11/2016 0901   CREATININE 1.39 (H) 02/06/2014 1012      Component Value Date/Time   CALCIUM 8.4 (L) 05/11/2016 0901   ALKPHOS 84 05/11/2016 0901   AST 21 05/11/2016 0901   ALT 16 (L) 05/11/2016 0901   BILITOT 0.3 05/11/2016 0901     Lab Results  Component Value Date   PSA 3.96 05/11/2016   PSA 3.70 04/01/2016    PSA 3.17 03/04/2016     PENDING LABS:   RADIOGRAPHIC STUDIES:  No results found.   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  Rising PSA resulted in restart of therapy with Jevtana, 05/03/2016.  Oncology history is updated.  Pre-treatment labs today: CBC diff, CMET, PSA.  I personally reviewed and went over laboratory results with the patient.  The results are noted within this dictation.  Labs satisfy treatment parameters.  Treatment today.  I have refilled his pain medications after reviewing the [facility] Controlled Substance Reporting System.  This can be filled on 06/01/2016 (which is one week early due to increased pain with Neulasta).  Xgeva and B12 injection as scheduled on 05/31/2016.  Return in 3 weeks for follow-up and next cycle of chemotherapy.   ORDERS PLACED FOR THIS ENCOUNTER: No orders of the defined types were placed in this encounter.   MEDICATIONS PRESCRIBED THIS ENCOUNTER: Meds ordered this encounter  Medications  . DISCONTD: oxyCODONE (OXY IR/ROXICODONE) 5 MG immediate release tablet    Sig: One to two tablets as needed every 4 hours for pain    Dispense:  90 tablet    Refill:  0    To be filled no sooner than 06/01/2016    Order Specific Question:   Supervising Provider    Answer:   Brunetta Genera [id number]  . oxyCODONE (OXY IR/ROXICODONE) 5 MG immediate release tablet    Sig: One to two tablets as needed every 4 hours for pain    Dispense:  90 tablet    Refill:  0    To be filled no sooner than 06/01/2016    Order Specific Question:   Supervising Provider    Answer:   Brunetta Genera [id number]    THERAPY PLAN:  Jevtana every 3 weeks and Xgeva + B12 every 4 weeks.  All questions were answered. The patient knows to call  the  clinic with any problems, questions or concerns. We can certainly see the patient much sooner if necessary.  Patient and plan discussed with Dr. Ancil Linsey and she is in agreement with the aforementioned.   This note is electronically signed by: Robynn Pane, PA-C 05/24/2016 10:10 AM

## 2016-05-24 NOTE — Patient Instructions (Signed)
[place] at [facility] Discharge Instructions  RECOMMENDATIONS MADE BY THE CONSULTANT AND ANY TEST RESULTS WILL BE SENT TO YOUR REFERRING PHYSICIAN.  You were seen today by Kirby Crigler PA-C. Labs pending. Treatment today and Neulasta Onpro as well. REturn in 3 weeks for follow up and treatment.   Thank you for choosing [place] at [facility] to provide your oncology and hematology care.  To afford each patient quality time with our provider, please arrive at least 15 minutes before your scheduled appointment time.    If you have a lab appointment with the [facility] please come in thru the  Main Entrance and check in at the main information desk  You need to re-schedule your appointment should you arrive 10 or more minutes late.  We strive to give you quality time with our providers, and arriving late affects you and other patients whose appointments are after yours.  Also, if you no show three or more times for appointments you may be dismissed from the clinic at the providers discretion.     Again, thank you for choosing [facility].  Our hope is that these requests will decrease the amount of time that you wait before being seen by our physicians.       _____________________________________________________________  Should you have questions after your visit to [facility], please contact our office at [phone] between the hours of 8:30 a.m. and 4:30 p.m.  Voicemails left after 4:30 p.m. will not be returned until the following business day.  For prescription refill requests, have your pharmacy contact our office.       Resources For Cancer Patients and their Caregivers ? American Cancer Society: Can assist with transportation, wigs, general needs, runs Look Good Feel Better.        9071762851 ? Cancer Care: Provides financial assistance, online support groups, medication/co-pay assistance.   1-800-813-HOPE 6093743239) ? [place] Assists [place] cancer patients and their families through emotional , educational and financial support.  931 171 2508 ? [place] DSS Where to apply for food stamps, Medicaid and utility assistance. 606-105-2280 ? [employer]: Transportation to medical appointments. 838-238-5081 ? Social Security Administration: May apply for disability if have a Stage IV cancer. (570) 567-9155 3407110072 ? [employer], Disability and Transit Services: Assists with nutrition, care and transit needs. [place] Support Programs: @10RELATIVEDAYS @ > Cancer Support Group  2nd Tuesday of the month 1pm-2pm, Journey Room  > Creative Journey  3rd Tuesday of the month 1130am-1pm, Journey Room  > Look Good Feel Better  1st Wednesday of the month 10am-12 noon, Journey Room (Call [place] to register [phone])

## 2016-05-27 ENCOUNTER — Encounter (HOSPITAL_COMMUNITY): Payer: Self-pay | Admitting: Hematology & Oncology

## 2016-05-28 ENCOUNTER — Other Ambulatory Visit (HOSPITAL_COMMUNITY): Payer: Self-pay | Admitting: *Deleted

## 2016-05-31 ENCOUNTER — Encounter (HOSPITAL_COMMUNITY): Payer: Medicare Other | Attending: Oncology

## 2016-05-31 ENCOUNTER — Encounter (HOSPITAL_COMMUNITY): Payer: Self-pay

## 2016-05-31 ENCOUNTER — Encounter (HOSPITAL_COMMUNITY): Payer: Medicare Other

## 2016-05-31 VITALS — BP 111/64 | HR 84 | Temp 97.9°F | Resp 18

## 2016-05-31 DIAGNOSIS — C7951 Secondary malignant neoplasm of bone: Secondary | ICD-10-CM | POA: Diagnosis not present

## 2016-05-31 DIAGNOSIS — E538 Deficiency of other specified B group vitamins: Secondary | ICD-10-CM

## 2016-05-31 DIAGNOSIS — C61 Malignant neoplasm of prostate: Secondary | ICD-10-CM

## 2016-05-31 DIAGNOSIS — M899 Disorder of bone, unspecified: Secondary | ICD-10-CM

## 2016-05-31 LAB — CBC WITH DIFFERENTIAL/PLATELET
BASOS PCT: 0 %
Basophils Absolute: 0 10*3/uL (ref 0.0–0.1)
EOS ABS: 0 10*3/uL (ref 0.0–0.7)
EOS PCT: 0 %
HEMATOCRIT: 33.7 % — AB (ref 39.0–52.0)
HEMOGLOBIN: 11.3 g/dL — AB (ref 13.0–17.0)
LYMPHS PCT: 9 %
Lymphs Abs: 5 10*3/uL — ABNORMAL HIGH (ref 0.7–4.0)
MCH: 31.4 pg (ref 26.0–34.0)
MCHC: 33.5 g/dL (ref 30.0–36.0)
MCV: 93.6 fL (ref 78.0–100.0)
Monocytes Absolute: 3.3 10*3/uL — ABNORMAL HIGH (ref 0.1–1.0)
Monocytes Relative: 6 %
NEUTROS ABS: 46.7 10*3/uL — AB (ref 1.7–7.7)
Neutrophils Relative %: 85 %
PLATELETS: 333 10*3/uL (ref 150–400)
RBC: 3.6 MIL/uL — ABNORMAL LOW (ref 4.22–5.81)
RDW: 15.6 % — ABNORMAL HIGH (ref 11.5–15.5)
WBC MORPHOLOGY: INCREASED
WBC: 55 10*3/uL (ref 4.0–10.5)

## 2016-05-31 LAB — COMPREHENSIVE METABOLIC PANEL
ALK PHOS: 139 U/L — AB (ref 38–126)
ALT: 16 U/L — AB (ref 17–63)
AST: 21 U/L (ref 15–41)
Albumin: 3.9 g/dL (ref 3.5–5.0)
Anion gap: 6 (ref 5–15)
BILIRUBIN TOTAL: 0.4 mg/dL (ref 0.3–1.2)
BUN: 18 mg/dL (ref 6–20)
CALCIUM: 8.8 mg/dL — AB (ref 8.9–10.3)
CO2: 24 mmol/L (ref 22–32)
CREATININE: 1.83 mg/dL — AB (ref 0.61–1.24)
Chloride: 104 mmol/L (ref 101–111)
GFR calc Af Amer: 42 mL/min — ABNORMAL LOW (ref >60)
GFR, EST NON AFRICAN AMERICAN: 36 mL/min — AB (ref >60)
Glucose, Bld: 98 mg/dL (ref 65–99)
Potassium: 4.6 mmol/L (ref 3.5–5.1)
Sodium: 134 mmol/L — ABNORMAL LOW (ref 135–145)
TOTAL PROTEIN: 8 g/dL (ref 6.5–8.1)

## 2016-05-31 LAB — PSA: PSA: 3.42 ng/mL (ref 0.00–4.00)

## 2016-05-31 MED ORDER — CYANOCOBALAMIN 1000 MCG/ML IJ SOLN
1000.0000 ug | Freq: Once | INTRAMUSCULAR | Status: AC
  Administered 2016-05-31: 1000 ug via INTRAMUSCULAR

## 2016-05-31 MED ORDER — CYANOCOBALAMIN 1000 MCG/ML IJ SOLN
INTRAMUSCULAR | Status: AC
  Filled 2016-05-31: qty 1

## 2016-05-31 NOTE — Progress Notes (Signed)
Jeff [name] presents today for injection per MD orders. B12 1051mcg administered IM in right Upper Arm. Administration without incident. Patient tolerated well. Patient not to get Delton See today for low calcium level.  Per Robynn Pane, PA, patient to return as scheduled for treatment and we will give him Delton See that day if his calcium is adequate.

## 2016-05-31 NOTE — Progress Notes (Signed)
CRITICAL VALUE ALERT Critical value received:  WBC 55.04 Date of notification:  05/31/16 Time of notification: 1233 Critical value read back:  Yes.   Nurse who received alert:  T.Lujean Ebright,RN MD notified (1st page):  Verbal T.Kefalas,PA at 1237

## 2016-05-31 NOTE — Patient Instructions (Signed)
[place] at [facility] Discharge Instructions  RECOMMENDATIONS MADE BY THE CONSULTANT AND ANY TEST RESULTS WILL BE SENT TO YOUR REFERRING PHYSICIAN.  B12 today. Unable to give Delton See today because your calcium level was too low.  We will try to give it to you during your next treatment date pending lab results.    Thank you for choosing [place] at [facility] to provide your oncology and hematology care.  To afford each patient quality time with our provider, please arrive at least 15 minutes before your scheduled appointment time.    If you have a lab appointment with the [facility] please come in thru the  Main Entrance and check in at the main information desk  You need to re-schedule your appointment should you arrive 10 or more minutes late.  We strive to give you quality time with our providers, and arriving late affects you and other patients whose appointments are after yours.  Also, if you no show three or more times for appointments you may be dismissed from the clinic at the providers discretion.     Again, thank you for choosing [facility].  Our hope is that these requests will decrease the amount of time that you wait before being seen by our physicians.       _____________________________________________________________  Should you have questions after your visit to [facility], please contact our office at [phone] between the hours of 8:30 a.m. and 4:30 p.m.  Voicemails left after 4:30 p.m. will not be returned until the following business day.  For prescription refill requests, have your pharmacy contact our office.       Resources For Cancer Patients and their Caregivers ? American Cancer Society: Can assist with transportation, wigs, general needs, runs Look Good Feel Better.        (425)563-8528 ? Cancer Care: Provides financial assistance, online support groups, medication/co-pay  assistance.  1-800-813-HOPE (928)669-4069) ? [place] Assists [place] cancer patients and their families through emotional , educational and financial support.  (848)527-4117 ? [place] DSS Where to apply for food stamps, Medicaid and utility assistance. (832)193-4534 ? [employer]: Transportation to medical appointments. 571-716-5908 ? Social Security Administration: May apply for disability if have a Stage IV cancer. 586 108 6657 737-131-5221 ? [employer], Disability and Transit Services: Assists with nutrition, care and transit needs. [place] Support Programs: @10RELATIVEDAYS @ > Cancer Support Group  2nd Tuesday of the month 1pm-2pm, Journey Room  > Creative Journey  3rd Tuesday of the month 1130am-1pm, Journey Room  > Look Good Feel Better  1st Wednesday of the month 10am-12 noon, Journey Room (Call [place] to register [phone])

## 2016-06-13 NOTE — Progress Notes (Signed)
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    BRIEF ONCOLOGIC HISTORY:    Prostate cancer metastatic to multiple sites ([place])   01/26/2014 Tumor Marker    PSA > 5000      01/28/2014 Initial Biopsy    Metastatic adenocarcinoma of prostate      02/05/2014 Surgery    Bilateral orchiectomy by Dr. Junious Silk      02/26/2014 Tumor Marker    PSA = 399.5      03/14/2014 Tumor Marker    PSA= 89.51      03/14/2014 - 06/26/2014 Chemotherapy    Docetaxel 75 mg/kg every 21 days x 6 cycles      04/24/2014 Tumor Marker    PSA- 19.89      07/23/2014 Procedure    Nephrostomy tube removed by IR, Dr. Geroge Baseman      07/24/2014 Tumor Marker    PSA= 6.15      10/16/2014 Tumor Marker    PSA: 3.54       01/08/2015 Tumor Marker    PSA: 2.13       01/17/2015 Imaging    CT CAP-  Massive pelvic and retroperitoneal lymphadenopathy noted on the prior study has nearly completely resolved, now with only a small amount of residual amorphous soft tissue predominantly around the the infrarenal abdominal aorta.      01/17/2015 Imaging    Bone scan- Widespread osseous metastatic disease with multiple foci of increased activity throughout the skeleton, corresponding with the findings on the prior CT from October, 2015.      04/11/2015 Tumor Marker    PSA: 1.87       07/21/2015 Imaging    Bone scan- Bony metastatic disease again noted at multiple sites, stable from prior study. No progression of bony metastatic disease is demonstrable on this study.      07/25/2015 Imaging    CT CAP- Stable matted soft tissue density in the retroperitoneum and extraperitoneal pelvis  consistent with treated disease. No recurrent lymphadenopathy.  No pulmonary metastatic disease. Diffuse stable sclerotic metastatic bone disease.      01/14/2016 Imaging    Bone scan-  Multiple sites of abnormal increased tracer localization involving BILATERAL ribs, T11, T12, questionably RIGHT scapula and LEFT iliac bone suspicious for osseous metastases.      01/23/2016 Imaging    CT CAP- 1. Similar widespread osseous metastasis. 2. Similar soft tissue thickening within the retroperitoneum of the abdomen. Improved left pelvic side wall soft tissue thickening. These are consistent with sites of treated disease. No well-defined adenopathy. 3. No new sites of disease.      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 Completed Docetaxel x 6 cycles on 06/26/14 with great response.  Now slowly rising PSA (doubling time ~ 9 months) resulting in restarting systemic treatment with Jevtana beginning on 05/03/16    INTERVAL HISTORY:  [name] returns for routine follow-up and consideration for next cycle Jevtana.    He is due for cycle #  [place]. Last Xgeva 04/29/16.  He has been feeling very well. His energy levels and appetite are excellent; he states, "I feel better and better every day."  Pain is well-managed with oxycodone 5 mg as needed.  He takes the oxycodone "usually every other day when my back is really hurting."  He thinks he has about 7-8 oxycodone pills remaining and is asking if he can have a new post-dated prescription today since he will likely run out before his next cycle of chemotherapy in a few weeks.   His bowels are moving daily; denies constipation or diarrhea.    Denies any hematuria or dysuria.  Denies fever or chills. Denies peripheral neuropathy.  He is largely without symptoms today.    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      REVIEW OF SYSTEMS:  Review of Systems  Constitutional: Negative for appetite change, chills, fatigue and fever.  HENT:  Negative.  Negative for mouth sores.   Eyes: Negative.   Respiratory: Negative.  Negative for cough and shortness of breath.   Cardiovascular: Negative.  Negative for chest pain and palpitations.  Gastrointestinal: Negative.  Negative for abdominal pain, blood in stool, constipation, diarrhea, nausea and vomiting.  Endocrine: Negative.   Genitourinary: Negative.  Negative for dysuria and hematuria.   Musculoskeletal: Positive for back pain.  Skin: Negative.  Negative for rash.  Neurological: Negative.  Negative for dizziness and headaches.  Hematological: Negative.   Psychiatric/Behavioral: Negative.      PAST MEDICAL/SURGICAL HISTORY:  Past Medical History:  Diagnosis Date  . Acute renal failure ([place]) 01/26/2014  . Alcohol abuse   . Anemia of chronic disease 01/26/2014  . Bilateral hydronephrosis 01/26/2014  . History of cocaine abuse 01/26/2014  . Homelessness 01/31/2014  . Prostate cancer metastatic to multiple sites ([place]) 01/30/2014  . Sepsis ([place])   . UTI (lower urinary tract infection)    Past Surgical History:  Procedure Laterality Date  . ORCHIECTOMY Bilateral 02/05/2014   Procedure: BILATERAL ORCHIECTOMY;  Surgeon: Festus Aloe, MD;  Location: [facility];  Service: Urology;  Laterality: Bilateral;  . PERCUTANEOUS NEPHROSTOMY Bilateral    IR Dr. Junious Silk changed on 04/22/2014  . PORTACATH PLACEMENT Right 03/12/14     SOCIAL HISTORY:  Social History   Social History  . Marital status: Single    Spouse name: N/A  . Number of children: 1  . Years of education: 9th   Occupational History  . disabilty [employer]   Social History Main Topics  . Smoking status: Current Every Day Smoker    Packs/day: 0.25    Years: 55.00     Types: Cigarettes  . Smokeless tobacco: Never Used  . Alcohol use No     Comment: none for at least 7 -8 years  . Drug use: Yes    Types: Cocaine     Comment: last used Cocaine 01/25/14  . Sexual activity: Yes   Other Topics Concern  . Not on file   Social History Narrative   Lives at [employer] 9th grade   1 son, lives in [place], [place]   Can read and write in native language             FAMILY HISTORY:  Family History  Problem Relation Age of Onset  . Cancer Brother 54    CURRENT MEDICATIONS:  Outpatient Encounter Prescriptions as of  06/14/2016  Medication Sig Note  . acetaminophen (TYLENOL) 325 MG tablet Take 2 tablets (650 mg total) by mouth every 6 (six) hours as needed for mild pain (or Fever >/= 101). 05/23/2014: Has not taken per caregiver of facility  . Cabazitaxel (JEVTANA IV) Inject into the vein. Every 3 weeks   . calcium-vitamin D (OSCAL WITH D) 500-200 MG-UNIT tablet Take 2 tablets by mouth daily with breakfast.   . lidocaine-prilocaine (EMLA) cream Apply a quarter size amount to port site 1 hour prior to chemo. Do not rub in. Cover with plastic wrap. 07/24/2014: Only takes when getting port flushed  . metoCLOPramide (REGLAN) 5 MG tablet The day after chemo take 1 tab four times a day x 2 days. Then may take 1 tab four times a day if needed for nausea/vomiting. 07/24/2014: Just takes as needed  . ondansetron (ZOFRAN) 8 MG tablet Take 1 tablet (8 mg total) by mouth every 8 (eight) hours as needed for nausea or vomiting.   [name] Kitchen oxyCODONE (OXY IR/ROXICODONE) 5 MG immediate release tablet One to two tablets as needed every 4 hours for pain   . predniSONE (DELTASONE) 10 MG tablet Take 1 tablet (10 mg total) by mouth daily with breakfast.   . prochlorperazine (COMPAZINE) 10 MG tablet The day after chemo take 1 tab four times a day x 2 days. Then may take 1 tab four times a day if needed for nausea/vomiting. 07/24/2014: Just takes as needed  . [DISCONTINUED]  oxyCODONE (OXY IR/ROXICODONE) 5 MG immediate release tablet One to two tablets as needed every 4 hours for pain    Facility-Administered Encounter Medications as of 06/14/2016  Medication  . cyanocobalamin ((VITAMIN B-12)) injection 1,000 mcg  . denosumab (XGEVA) injection 120 mg    ALLERGIES:  No Known Allergies   PHYSICAL EXAM:  ECOG Performance status: 1 - Symptomatic, but independent.      Physical Exam  Constitutional: He is oriented to person, place, and time and well-developed, well-nourished, and in no distress.  HENT:  Head: Normocephalic.  Mouth/Throat: Oropharynx is clear and moist. No oropharyngeal exudate.  Eyes: Conjunctivae are normal. No scleral icterus.  Neck: Normal range of motion. Neck supple.  Cardiovascular: Normal rate, regular rhythm and normal heart sounds.   Pulmonary/Chest: Effort normal and breath sounds normal. No respiratory distress.  Abdominal: Soft. Bowel sounds are normal. There is no tenderness.  Musculoskeletal: Normal range of motion. He exhibits no edema.  Lymphadenopathy:    He has no cervical adenopathy.  Neurological: He is alert and oriented to person, place, and time. No cranial nerve deficit.  Skin: Skin is warm and dry. No rash noted.  Psychiatric: Mood, memory, affect and judgment normal.     LABORATORY DATA:  I have reviewed the labs as listed.  CBC    Component Value Date/Time   WBC 4.6 06/14/2016 0911   RBC 3.42 (L) 06/14/2016 0911   HGB 10.8 (L) 06/14/2016 0911   HCT 31.9 (L) 06/14/2016 0911   PLT 382 06/14/2016 0911   MCV 93.3 06/14/2016 0911   MCH 31.6 06/14/2016 0911   MCHC 33.9 06/14/2016 0911   RDW 15.5 06/14/2016 0911   LYMPHSABS 1.1 06/14/2016 0911   MONOABS 0.7 06/14/2016 0911   EOSABS 0.1 06/14/2016 0911   BASOSABS 0.0 06/14/2016 0911   CMP Latest Ref Rng & Units 06/14/2016 05/31/2016 05/24/2016  Glucose 65 - 99 mg/dL 101(H) 98 93  BUN 6 - 20 mg/dL 22(H) 18 20  Creatinine 0.61 - 1.24  mg/dL 1.69(H) 1.83(H)  1.77(H)  Sodium 135 - 145 mmol/L 134(L) 134(L) 134(L)  Potassium 3.5 - 5.1 mmol/L 4.6 4.6 4.5  Chloride 101 - 111 mmol/L 106 104 103  CO2 22 - 32 mmol/L 23 24 24   Calcium 8.9 - 10.3 mg/dL 8.9 8.8(L) 8.8(L)  Total Protein 6.5 - 8.1 g/dL 8.1 8.0 7.7  Total Bilirubin 0.3 - 1.2 mg/dL 0.4 0.4 0.5  Alkaline Phos 38 - 126 U/L 61 139(H) 47  AST 15 - 41 U/L 25 21 22   ALT 17 - 63 U/L 17 16(L) 18   Results for DAILY, HUESTON (MRN [id number])   Ref. Range 03/04/2016 12:58 04/01/2016 09:10 05/11/2016 09:02 05/24/2016 09:36 05/31/2016 12:06  PSA Latest Ref Range: 0.00 - 4.00 ng/mL 3.17 3.70 3.96 3.92 3.42   Results for RAYBURN, DEPASQUALE (MRN [id number])   Ref. 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  -Clinically, he is tolerating Jevtana very well; Previous PSA values since starting Jevtana 3.96, 3.92, & 3.42.  PSA today 3.08.  -Labs reviewed and adequate for cycle #3 Jevtana today. Mild anemia with hemoglobin 10.8; likely treatment effect; elevated creatinine improved today 1.69; will continue to monitor.   -We will order restaging scans (CT chest/abd/pelvis & bone scan) after 6 cycles.  -Return to cancer center in 3 weeks for consideration of cycle #4.   Bone mets:  -Calcium 8.9 today. -Continue Xgeva, as directed. He received dose as scheduled today.    Pain secondary to metastatic disease:  -Oxycodone 5 mg prescription given to patient with pharmacy notation that prescription can be filled on 06/23/16 or after.  Oxycodone 5 mg, take 1-2 tabs po Q4Hprn, #90, no refills.   Bowel regimen:  -Bowels moving regularly without additional intervention; he  understands the importance of adequate bowel regimen while taking opiates.     Dispo:  -Return to cancer center in 3 weeks for consideration of cycle #4 Jevtana.    All questions were answered to patient's stated satisfaction. Encouraged patient to call with any new concerns or questions before his next visit to the cancer center and we can certain see him sooner, if needed.     Plan of care discussed with Dr. Talbert Cage, who agrees with the above aforementioned.    Mike Craze, NP [place] [phone]

## 2016-06-14 ENCOUNTER — Encounter (HOSPITAL_BASED_OUTPATIENT_CLINIC_OR_DEPARTMENT_OTHER): Payer: Medicare Other | Admitting: Adult Health

## 2016-06-14 ENCOUNTER — Encounter (HOSPITAL_BASED_OUTPATIENT_CLINIC_OR_DEPARTMENT_OTHER): Payer: Medicare Other

## 2016-06-14 ENCOUNTER — Encounter (HOSPITAL_COMMUNITY): Payer: Self-pay | Admitting: Adult Health

## 2016-06-14 VITALS — BP 128/83 | HR 55 | Temp 98.6°F | Resp 16 | Wt 176.8 lb

## 2016-06-14 DIAGNOSIS — G893 Neoplasm related pain (acute) (chronic): Secondary | ICD-10-CM

## 2016-06-14 DIAGNOSIS — Z5189 Encounter for other specified aftercare: Secondary | ICD-10-CM | POA: Diagnosis not present

## 2016-06-14 DIAGNOSIS — C7951 Secondary malignant neoplasm of bone: Secondary | ICD-10-CM

## 2016-06-14 DIAGNOSIS — M899 Disorder of bone, unspecified: Secondary | ICD-10-CM

## 2016-06-14 DIAGNOSIS — Z5111 Encounter for antineoplastic chemotherapy: Secondary | ICD-10-CM | POA: Diagnosis not present

## 2016-06-14 DIAGNOSIS — C61 Malignant neoplasm of prostate: Secondary | ICD-10-CM

## 2016-06-14 LAB — COMPREHENSIVE METABOLIC PANEL
ALBUMIN: 3.9 g/dL (ref 3.5–5.0)
ALT: 17 U/L (ref 17–63)
ANION GAP: 5 (ref 5–15)
AST: 25 U/L (ref 15–41)
Alkaline Phosphatase: 61 U/L (ref 38–126)
BILIRUBIN TOTAL: 0.4 mg/dL (ref 0.3–1.2)
BUN: 22 mg/dL — ABNORMAL HIGH (ref 6–20)
CHLORIDE: 106 mmol/L (ref 101–111)
CO2: 23 mmol/L (ref 22–32)
Calcium: 8.9 mg/dL (ref 8.9–10.3)
Creatinine, Ser: 1.69 mg/dL — ABNORMAL HIGH (ref 0.61–1.24)
GFR calc Af Amer: 46 mL/min — ABNORMAL LOW (ref >60)
GFR calc non Af Amer: 40 mL/min — ABNORMAL LOW (ref >60)
GLUCOSE: 101 mg/dL — AB (ref 65–99)
POTASSIUM: 4.6 mmol/L (ref 3.5–5.1)
SODIUM: 134 mmol/L — AB (ref 135–145)
TOTAL PROTEIN: 8.1 g/dL (ref 6.5–8.1)

## 2016-06-14 LAB — CBC WITH DIFFERENTIAL/PLATELET
Basophils Absolute: 0 10*3/uL (ref 0.0–0.1)
Basophils Relative: 0 %
EOS ABS: 0.1 10*3/uL (ref 0.0–0.7)
EOS PCT: 1 %
HCT: 31.9 % — ABNORMAL LOW (ref 39.0–52.0)
Hemoglobin: 10.8 g/dL — ABNORMAL LOW (ref 13.0–17.0)
Lymphocytes Relative: 24 %
Lymphs Abs: 1.1 10*3/uL (ref 0.7–4.0)
MCH: 31.6 pg (ref 26.0–34.0)
MCHC: 33.9 g/dL (ref 30.0–36.0)
MCV: 93.3 fL (ref 78.0–100.0)
MONO ABS: 0.7 10*3/uL (ref 0.1–1.0)
MONOS PCT: 16 %
Neutro Abs: 2.7 10*3/uL (ref 1.7–7.7)
Neutrophils Relative %: 59 %
PLATELETS: 382 10*3/uL (ref 150–400)
RBC: 3.42 MIL/uL — ABNORMAL LOW (ref 4.22–5.81)
RDW: 15.5 % (ref 11.5–15.5)
WBC: 4.6 10*3/uL (ref 4.0–10.5)

## 2016-06-14 LAB — PSA: PSA: 3.08 ng/mL (ref 0.00–4.00)

## 2016-06-14 MED ORDER — DIPHENHYDRAMINE HCL 50 MG/ML IJ SOLN
25.0000 mg | Freq: Once | INTRAMUSCULAR | Status: AC
  Administered 2016-06-14: 25 mg via INTRAVENOUS
  Filled 2016-06-14: qty 1

## 2016-06-14 MED ORDER — DEXAMETHASONE SODIUM PHOSPHATE 10 MG/ML IJ SOLN
INTRAMUSCULAR | Status: AC
  Filled 2016-06-14: qty 1

## 2016-06-14 MED ORDER — DEXAMETHASONE SODIUM PHOSPHATE 10 MG/ML IJ SOLN
10.0000 mg | Freq: Once | INTRAMUSCULAR | Status: AC
  Administered 2016-06-14: 10 mg via INTRAVENOUS

## 2016-06-14 MED ORDER — PEGFILGRASTIM 6 MG/0.6ML ~~LOC~~ PSKT
6.0000 mg | PREFILLED_SYRINGE | Freq: Once | SUBCUTANEOUS | Status: AC
  Administered 2016-06-14: 6 mg via SUBCUTANEOUS
  Filled 2016-06-14: qty 0.6

## 2016-06-14 MED ORDER — DENOSUMAB 120 MG/1.7ML ~~LOC~~ SOLN
120.0000 mg | Freq: Once | SUBCUTANEOUS | Status: AC
  Administered 2016-06-14: 120 mg via SUBCUTANEOUS
  Filled 2016-06-14: qty 1.7

## 2016-06-14 MED ORDER — SODIUM CHLORIDE 0.9 % IV SOLN: 10.0000 mg | Freq: Once | INTRAVENOUS | Status: DC

## 2016-06-14 MED ORDER — FAMOTIDINE IN NACL 20-0.9 MG/50ML-% IV SOLN
INTRAVENOUS | Status: AC
  Filled 2016-06-14: qty 50

## 2016-06-14 MED ORDER — SODIUM CHLORIDE 0.9 % IV SOLN
Freq: Once | INTRAVENOUS | Status: AC
  Administered 2016-06-14: 10:00:00 via INTRAVENOUS

## 2016-06-14 MED ORDER — SODIUM CHLORIDE 0.9% FLUSH
10.0000 mL | INTRAVENOUS | Status: DC | PRN
  Administered 2016-06-14: 10 mL
  Filled 2016-06-14: qty 10

## 2016-06-14 MED ORDER — HEPARIN SOD (PORK) LOCK FLUSH 100 UNIT/ML IV SOLN
500.0000 [IU] | Freq: Once | INTRAVENOUS | Status: AC | PRN
  Administered 2016-06-14: 500 [IU]
  Filled 2016-06-14: qty 5

## 2016-06-14 MED ORDER — FAMOTIDINE IN NACL 20-0.9 MG/50ML-% IV SOLN
20.0000 mg | Freq: Once | INTRAVENOUS | Status: AC
  Administered 2016-06-14: 20 mg via INTRAVENOUS
  Filled 2016-06-14: qty 50

## 2016-06-14 MED ORDER — OXYCODONE HCL 5 MG PO TABS: ORAL_TABLET | ORAL | 0 refills | Status: DC

## 2016-06-14 MED ORDER — DEXTROSE 5 % IV SOLN
20.0000 mg/m2 | Freq: Once | INTRAVENOUS | Status: AC
  Administered 2016-06-14: 41 mg via INTRAVENOUS
  Filled 2016-06-14: qty 4.1

## 2016-06-14 NOTE — Patient Instructions (Signed)
[facility] Discharge Instructions for Patients Receiving Chemotherapy   Beginning January 23rd 2017 lab work for the Cancer Center will be done in the  Main lab at [facility] on 1st floor. If you have a lab appointment with the Cancer Center please come in thru the  Main Entrance and check in at the main information desk   Today you received the following chemotherapy agents   To help prevent nausea and vomiting after your treatment, we encourage you to take your nausea medication     If you develop nausea and vomiting, or diarrhea that is not controlled by your medication, call the clinic.  The clinic phone number is [phone]. Office hours are Monday-Friday 8:30am-5:00pm.  BELOW ARE SYMPTOMS THAT SHOULD BE REPORTED IMMEDIATELY:  *FEVER GREATER THAN 101.0 F  *CHILLS WITH OR WITHOUT FEVER  NAUSEA AND VOMITING THAT IS NOT CONTROLLED WITH YOUR NAUSEA MEDICATION  *UNUSUAL SHORTNESS OF BREATH  *UNUSUAL BRUISING OR BLEEDING  TENDERNESS IN MOUTH AND THROAT WITH OR WITHOUT PRESENCE OF ULCERS  *URINARY PROBLEMS  *BOWEL PROBLEMS  UNUSUAL RASH Items with * indicate a potential emergency and should be followed up as soon as possible. If you have an emergency after office hours please contact your primary care physician or go to the nearest emergency department.  Please call the clinic during office hours if you have any questions or concerns.   You may also contact the Patient Navigator at [phone] should you have any questions or need assistance in obtaining follow up care.      Resources For Cancer Patients and their Caregivers ? American Cancer Society: Can assist with transportation, wigs, general needs, runs Look Good Feel Better.        1-888-227-6333 ? Cancer Care: Provides financial assistance, online support groups, medication/co-pay assistance.  1-800-813-HOPE (4673) ? [facility] Assists [place] cancer  patients and their families through emotional , educational and financial support.  336-427-4357 ? [place] DSS Where to apply for food stamps, Medicaid and utility assistance. 336-342-1394 ? RCATS: Transportation to medical appointments. 336-347-2287 ? Social Security Administration: May apply for disability if have a Stage IV cancer. 336-342-7796 1-800-772-1213 ? [place] Aging, Disability and Transit Services: Assists with nutrition, care and transit needs. 336-349-2343         

## 2016-06-14 NOTE — Progress Notes (Signed)
Jeff [name] presents today for injection per MD orders. Xgeva 120mg  administered SQ in right Abdomen. Administration without incident. Patient tolerated well.  Chemotherapy given today per orders. Patient tolerated it well without problems. [name] KitchenAdriana [name] arrived today for [facility] neulasta on body injector. See MAR for administration details. Injector in place and engaged with green light indicator on flashing. Tolerated application with out problems.  Vitals stable and discharge home ambulatory from clinic.follow up as scheduled.

## 2016-06-14 NOTE — Patient Instructions (Addendum)
[place] at [facility] Discharge Instructions  RECOMMENDATIONS MADE BY THE CONSULTANT AND ANY TEST RESULTS WILL BE SENT TO YOUR REFERRING PHYSICIAN.  You saw Jeff Craze, NP, today Follow up in 3 weeks with next cycle chemo. See Amy at checkout for appointments.  Thank you for choosing [place] at [facility] to provide your oncology and hematology care.  To afford each patient quality time with our provider, please arrive at least 15 minutes before your scheduled appointment time.    If you have a lab appointment with the [facility] please come in thru the  Main Entrance and check in at the main information desk  You need to re-schedule your appointment should you arrive 10 or more minutes late.  We strive to give you quality time with our providers, and arriving late affects you and other patients whose appointments are after yours.  Also, if you no show three or more times for appointments you may be dismissed from the clinic at the providers discretion.     Again, thank you for choosing [facility].  Our hope is that these requests will decrease the amount of time that you wait before being seen by our physicians.       _____________________________________________________________  Should you have questions after your visit to [facility], please contact our office at [phone] between the hours of 8:30 a.m. and 4:30 p.m.  Voicemails left after 4:30 p.m. will not be returned until the following business day.  For prescription refill requests, have your pharmacy contact our office.       Resources For Cancer Patients and their Caregivers ? American Cancer Society: Can assist with transportation, wigs, general needs, runs Look Good Feel Better.        (608) 490-5597 ? Cancer Care: Provides financial assistance, online support groups, medication/co-pay assistance.  1-800-813-HOPE 2564936089) ? [facility] Assists [place] cancer patients and their families through emotional , educational and financial support.  (515)678-2671 ? [place] DSS Where to apply for food stamps, Medicaid and utility assistance. 980-296-0953 ? RCATS: Transportation to medical appointments. 323 801 6358 ? Social Security Administration: May apply for disability if have a Stage IV cancer. 7031012185 (331)205-8028 ? [employer], Disability and Transit Services: Assists with nutrition, care and transit needs. [place] Support Programs: @10RELATIVEDAYS @ > Cancer Support Group  2nd Tuesday of the month 1pm-2pm, Journey Room  > Creative Journey  3rd Tuesday of the month 1130am-1pm, Journey Room  > Look Good Feel Better  1st Wednesday of the month 10am-12 noon, Journey Room (Call [place] to register [phone])

## 2016-06-28 ENCOUNTER — Encounter (HOSPITAL_COMMUNITY): Payer: Medicare Other

## 2016-06-28 ENCOUNTER — Encounter (HOSPITAL_COMMUNITY): Payer: Self-pay

## 2016-06-28 ENCOUNTER — Encounter (HOSPITAL_COMMUNITY): Payer: Medicare Other | Attending: Oncology

## 2016-06-28 VITALS — BP 121/63 | HR 83 | Temp 97.5°F | Resp 18

## 2016-06-28 DIAGNOSIS — E538 Deficiency of other specified B group vitamins: Secondary | ICD-10-CM | POA: Diagnosis not present

## 2016-06-28 DIAGNOSIS — C61 Malignant neoplasm of prostate: Secondary | ICD-10-CM | POA: Diagnosis not present

## 2016-06-28 DIAGNOSIS — M899 Disorder of bone, unspecified: Secondary | ICD-10-CM

## 2016-06-28 LAB — COMPREHENSIVE METABOLIC PANEL
ALK PHOS: 100 U/L (ref 38–126)
ALT: 20 U/L (ref 17–63)
ANION GAP: 6 (ref 5–15)
AST: 29 U/L (ref 15–41)
Albumin: 3.9 g/dL (ref 3.5–5.0)
BUN: 14 mg/dL (ref 6–20)
CALCIUM: 9 mg/dL (ref 8.9–10.3)
CO2: 25 mmol/L (ref 22–32)
Chloride: 105 mmol/L (ref 101–111)
Creatinine, Ser: 1.63 mg/dL — ABNORMAL HIGH (ref 0.61–1.24)
GFR, EST AFRICAN AMERICAN: 48 mL/min — AB (ref >60)
GFR, EST NON AFRICAN AMERICAN: 41 mL/min — AB (ref >60)
Glucose, Bld: 70 mg/dL (ref 65–99)
Potassium: 4.5 mmol/L (ref 3.5–5.1)
SODIUM: 136 mmol/L (ref 135–145)
TOTAL PROTEIN: 8.4 g/dL — AB (ref 6.5–8.1)
Total Bilirubin: 0.2 mg/dL — ABNORMAL LOW (ref 0.3–1.2)

## 2016-06-28 LAB — CBC WITH DIFFERENTIAL/PLATELET
BASOS PCT: 0 %
Basophils Absolute: 0 10*3/uL (ref 0.0–0.1)
EOS ABS: 0.1 10*3/uL (ref 0.0–0.7)
Eosinophils Relative: 1 %
HCT: 36.6 % — ABNORMAL LOW (ref 39.0–52.0)
HEMOGLOBIN: 12 g/dL — AB (ref 13.0–17.0)
Lymphocytes Relative: 12 %
Lymphs Abs: 2.3 10*3/uL (ref 0.7–4.0)
MCH: 31 pg (ref 26.0–34.0)
MCHC: 32.8 g/dL (ref 30.0–36.0)
MCV: 94.6 fL (ref 78.0–100.0)
MONO ABS: 1.3 10*3/uL — AB (ref 0.1–1.0)
MONOS PCT: 7 %
Neutro Abs: 14.7 10*3/uL — ABNORMAL HIGH (ref 1.7–7.7)
Neutrophils Relative %: 80 %
Platelets: 284 10*3/uL (ref 150–400)
RBC: 3.87 MIL/uL — ABNORMAL LOW (ref 4.22–5.81)
RDW: 16.2 % — ABNORMAL HIGH (ref 11.5–15.5)
WBC: 18.5 10*3/uL — ABNORMAL HIGH (ref 4.0–10.5)

## 2016-06-28 LAB — PSA: PSA: 2.7 ng/mL (ref 0.00–4.00)

## 2016-06-28 MED ORDER — CYANOCOBALAMIN 1000 MCG/ML IJ SOLN
INTRAMUSCULAR | Status: AC
  Filled 2016-06-28: qty 1

## 2016-06-28 MED ORDER — CYANOCOBALAMIN 1000 MCG/ML IJ SOLN
1000.0000 ug | Freq: Once | INTRAMUSCULAR | Status: AC
  Administered 2016-06-28: 1000 ug via INTRAMUSCULAR

## 2016-06-28 NOTE — Patient Instructions (Signed)
[facility] at [facility] Discharge Instructions  RECOMMENDATIONS MADE BY THE CONSULTANT AND ANY TEST RESULTS WILL BE SENT TO YOUR REFERRING PHYSICIAN.  Received Vit B12 injection today. Follow-up as scheduled. Call clinic for any questions or concerns  Thank you for choosing [facility] at [facility] to provide your oncology and hematology care.  To afford each patient quality time with our provider, please arrive at least 15 minutes before your scheduled appointment time.    If you have a lab appointment with the Cancer Center please come in thru the  Main Entrance and check in at the main information desk  You need to re-schedule your appointment should you arrive 10 or more minutes late.  We strive to give you quality time with our providers, and arriving late affects you and other patients whose appointments are after yours.  Also, if you no show three or more times for appointments you may be dismissed from the clinic at the providers discretion.     Again, thank you for choosing [facility].  Our hope is that these requests will decrease the amount of time that you wait before being seen by our physicians.       _____________________________________________________________  Should you have questions after your visit to [facility], please contact our office at [phone] between the hours of 8:30 a.m. and 4:30 p.m.  Voicemails left after 4:30 p.m. will not be returned until the following business day.  For prescription refill requests, have your pharmacy contact our office.       Resources For Cancer Patients and their Caregivers ? American Cancer Society: Can assist with transportation, wigs, general needs, runs Look Good Feel Better.        1-888-227-6333 ? Cancer Care: Provides financial assistance, online support groups, medication/co-pay assistance.  1-800-813-HOPE (4673) ? [facility] Assists [place] cancer patients and their families through emotional , educational and financial support.  336-427-4357 ? [place] DSS Where to apply for food stamps, Medicaid and utility assistance. 336-342-1394 ? RCATS: Transportation to medical appointments. 336-347-2287 ? Social Security Administration: May apply for disability if have a Stage IV cancer. 336-342-7796 1-800-772-1213 ? [place] Aging, Disability and Transit Services: Assists with nutrition, care and transit needs. 336-349-2343  Cancer Center Support Programs: @10RELATIVEDAYS@ > Cancer Support Group  2nd Tuesday of the month 1pm-2pm, Journey Room  > Creative Journey  3rd Tuesday of the month 1130am-1pm, Journey Room  > Look Good Feel Better  1st Wednesday of the month 10am-12 noon, Journey Room (Call American Cancer [employer] to register 1-800-395-5775)   

## 2016-06-28 NOTE — Progress Notes (Signed)
Jeff [name] tolerated Vit B12 injection well without complaints or incident. VSS Xgeva not due until 07/12/16.Pt discharged self ambulatory in satisfactory condition

## 2016-07-05 ENCOUNTER — Telehealth (HOSPITAL_COMMUNITY): Payer: Self-pay

## 2016-07-05 ENCOUNTER — Ambulatory Visit (HOSPITAL_COMMUNITY): Payer: Medicare Other

## 2016-07-05 ENCOUNTER — Ambulatory Visit (HOSPITAL_COMMUNITY): Payer: Medicare Other | Admitting: Oncology

## 2016-07-05 NOTE — Assessment & Plan Note (Deleted)
Stage IV adenocarcinoma of the prostate, hormone sensitive disease, with osseous involvement; S/P Docetaxel x 6 cycles 03/14/2014- 06/26/2014 with an excellent response.  Rising PSA resulted in restart of therapy with Jevtana, 05/03/2016.  PSA demonstrated a decrease following cycle #2 of therapy.  Oncology history is updated.  Pre-treatment labs today: CBC diff, CMET, PSA.  I personally reviewed and went over laboratory results with the patient.  The results are noted within this dictation.  Labs satisfy treatment parameters.  I have refilled his pain medications after reviewing the [facility] Controlled Substance Reporting System.  This can be filled on 06/01/2016 (which is one week early due to increased pain with Neulasta).  Xgeva on 07/12/2016.  B12 injection on 07/26/2016.  Return in 3 weeks for follow-up and cycle #5 of chemotherapy.  Will plan on restaging CT scans and bone scan following cycle #6 of therapy.

## 2016-07-05 NOTE — Telephone Encounter (Signed)
1000-called multiple numbers to check on patient to see why he has not arrived for his 930 appointment. Spoke with friend [name]- he was going to get in touch with him.

## 2016-07-05 NOTE — Progress Notes (Deleted)
No PCP Per Patient No address on file  No diagnosis found.  CURRENT THERAPY: Jevtana with Neulasta support every 21 days beginning on 05/03/2016.  Xgeva monthly to reduce risk of SRE.  B12 monthly.  INTERVAL HISTORY: Jeff [name] 71 y.o. male returns for followup of Stage IV adenocarcinoma of the prostate, hormone sensitive disease, with osseous involvement; S/P Docetaxel x 6 cycles 03/14/2014- 06/26/2014 with an excellent response.  Now with slowly rising PSA (doubling time ~ 9 months) resulting in re-initiation of chemotherapy consisting of Jevtana beginning on 05/03/2016.    Prostate cancer metastatic to multiple sites [facility])   01/26/2014 Tumor Marker    PSA > 5000      01/28/2014 Initial Biopsy    Metastatic adenocarcinoma of prostate      02/05/2014 Surgery    Bilateral orchiectomy by Dr. Junious Silk      02/26/2014 Tumor Marker    PSA = 399.5      03/14/2014 Tumor Marker    PSA= 89.51      03/14/2014 - 06/26/2014 Chemotherapy    Docetaxel 75 mg/kg every 21 days x 6 cycles      04/24/2014 Tumor Marker    PSA- 19.89      07/23/2014 Procedure    Nephrostomy tube removed by IR, Dr. Geroge Baseman      07/24/2014 Tumor Marker    PSA= 6.15      10/16/2014 Tumor Marker    PSA: 3.54       01/08/2015 Tumor Marker    PSA: 2.13       01/17/2015 Imaging    CT CAP-  Massive pelvic and retroperitoneal lymphadenopathy noted on the prior study has nearly completely resolved, now with only a small amount of residual amorphous soft tissue predominantly around the the infrarenal abdominal aorta.      01/17/2015 Imaging    Bone scan- Widespread osseous metastatic disease with multiple foci of increased activity throughout the skeleton, corresponding with the findings on the prior CT from October, 2015.      04/11/2015 Tumor Marker    PSA: 1.87       07/21/2015 Imaging    Bone scan- Bony metastatic disease again noted at multiple sites, stable from prior study. No  progression of bony metastatic disease is demonstrable on this study.      07/25/2015 Imaging    CT CAP- Stable matted soft tissue density in the retroperitoneum and extraperitoneal pelvis consistent with treated disease. No recurrent lymphadenopathy.  No pulmonary metastatic disease. Diffuse stable sclerotic metastatic bone disease.      01/14/2016 Imaging    Bone scan-  Multiple sites of abnormal increased tracer localization involving BILATERAL ribs, T11, T12, questionably RIGHT scapula and LEFT iliac bone suspicious for osseous metastases.      01/23/2016 Imaging    CT CAP- 1. Similar widespread osseous metastasis. 2. Similar soft tissue thickening within the retroperitoneum of the abdomen. Improved left pelvic side wall soft tissue thickening. These are consistent with sites of treated disease. No well-defined adenopathy. 3. No new sites of disease.      05/03/2016 -  Chemotherapy    Jevtana every 21 days        ***  Review of Systems  Constitutional: Negative.  Negative for chills, fever and weight loss.  HENT: Negative.   Eyes: Negative.   Respiratory: Negative.  Negative for cough.   Cardiovascular: Negative.  Negative for chest pain.  Gastrointestinal: Negative.  Negative for blood  in stool, constipation, diarrhea, melena, nausea and vomiting.  Genitourinary: Negative.   Musculoskeletal: Negative.   Skin: Negative.   Neurological: Negative.  Negative for weakness.  Endo/Heme/Allergies: Negative.   Psychiatric/Behavioral: Negative.     Past Medical History:  Diagnosis Date  . Acute renal failure ([place]) 01/26/2014  . Alcohol abuse   . Anemia of chronic disease 01/26/2014  . Bilateral hydronephrosis 01/26/2014  . History of cocaine abuse 01/26/2014  . Homelessness 01/31/2014  . Prostate cancer metastatic to multiple sites ([place]) 01/30/2014  . Sepsis ([place])   . UTI (lower urinary tract infection)     Past Surgical History:  Procedure Laterality Date  . ORCHIECTOMY  Bilateral 02/05/2014   Procedure: BILATERAL ORCHIECTOMY;  Surgeon: Festus Aloe, MD;  Location: [facility];  Service: Urology;  Laterality: Bilateral;  . PERCUTANEOUS NEPHROSTOMY Bilateral    IR Dr. Junious Silk changed on 04/22/2014  . PORTACATH PLACEMENT Right 03/12/14    Family History  Problem Relation Age of Onset  . Cancer Brother 32    Social History   Social History  . Marital status: Single    Spouse name: N/A  . Number of children: 1  . Years of education: 9th   Occupational History  . disabilty [employer]   Social History Main Topics  . Smoking status: Current Every Day Smoker    Packs/day: 0.25    Years: 55.00    Types: Cigarettes  . Smokeless tobacco: Never Used  . Alcohol use No     Comment: none for at least 7 -8 years  . Drug use: Yes    Types: Cocaine     Comment: last used Cocaine 01/25/14  . Sexual activity: Yes   Other Topics Concern  . Not on file   Social History Narrative   Lives at [employer] 9th grade   1 son, lives in [place], [place]   Can read and write in native language              PHYSICAL EXAMINATION  ECOG PERFORMANCE STATUS: 0 - Asymptomatic  There were no vitals filed for this visit.   GENERAL:alert, no distress, well nourished, well developed, comfortable, cooperative, smiling and unaccompanied. SKIN: skin color, texture, turgor are normal, no rashes or significant lesions HEAD: Normocephalic, No masses, lesions, tenderness or abnormalities EYES: normal, EOMI, Conjunctiva are pink and non-injected EARS: External ears normal OROPHARYNX:lips, buccal mucosa, and tongue normal and mucous membranes are moist  NECK: supple, no adenopathy, trachea midline LYMPH:  no palpable lymphadenopathy BREAST:not examined LUNGS: clear to auscultation  HEART: regular rate & rhythm ABDOMEN:abdomen soft and normal bowel sounds BACK: Back symmetric, no curvature. EXTREMITIES:less then 2 second capillary refill, no  joint deformities, effusion, or inflammation, no skin discoloration, no cyanosis  NEURO: alert & oriented x 3 with fluent speech, no focal motor/sensory deficits, gait normal   LABORATORY DATA: CBC    Component Value Date/Time   WBC 18.5 (H) 06/28/2016 0744   RBC 3.87 (L) 06/28/2016 0744   HGB 12.0 (L) 06/28/2016 0744   HCT 36.6 (L) 06/28/2016 0744   PLT 284 06/28/2016 0744   MCV 94.6 06/28/2016 0744   MCH 31.0 06/28/2016 0744   MCHC 32.8 06/28/2016 0744   RDW 16.2 (H) 06/28/2016 0744   LYMPHSABS 2.3 06/28/2016 0744   MONOABS 1.3 (H) 06/28/2016 0744   EOSABS 0.1 06/28/2016 0744   BASOSABS 0.0 06/28/2016 0744      Chemistry      Component Value  Date/Time   NA 136 06/28/2016 0744   K 4.5 06/28/2016 0744   CL 105 06/28/2016 0744   CO2 25 06/28/2016 0744   BUN 14 06/28/2016 0744   CREATININE 1.63 (H) 06/28/2016 0744   CREATININE 1.39 (H) 02/06/2014 1012      Component Value Date/Time   CALCIUM 9.0 06/28/2016 0744   ALKPHOS 100 06/28/2016 0744   AST 29 06/28/2016 0744   ALT 20 06/28/2016 0744   BILITOT 0.2 (L) 06/28/2016 0744     Lab Results  Component Value Date   PSA 2.70 06/28/2016   PSA 3.08 06/14/2016   PSA 3.42 05/31/2016     PENDING LABS:   RADIOGRAPHIC STUDIES:  No results found.   PATHOLOGY:    ASSESSMENT AND PLAN:  No problem-specific Assessment & Plan notes found for this encounter.   ORDERS PLACED FOR THIS ENCOUNTER: No orders of the defined types were placed in this encounter.   MEDICATIONS PRESCRIBED THIS ENCOUNTER: No orders of the defined types were placed in this encounter.   THERAPY PLAN:  Jevtana every 3 weeks and Xgeva + B12 every 4 weeks.  All questions were answered. The patient knows to call the clinic with any problems, questions or concerns. We can certainly see the patient much sooner if necessary.  Patient and plan discussed with Dr. Twana First and she is in agreement with the aforementioned.   This note is  electronically signed by: Doy Mince 07/05/2016 7:54 AM

## 2016-07-07 ENCOUNTER — Encounter (HOSPITAL_BASED_OUTPATIENT_CLINIC_OR_DEPARTMENT_OTHER): Payer: Medicare Other | Admitting: Oncology

## 2016-07-07 ENCOUNTER — Encounter (HOSPITAL_COMMUNITY): Payer: Self-pay

## 2016-07-07 ENCOUNTER — Encounter (HOSPITAL_BASED_OUTPATIENT_CLINIC_OR_DEPARTMENT_OTHER): Payer: Medicare Other

## 2016-07-07 VITALS — BP 118/64 | HR 72 | Temp 98.0°F | Resp 18 | Wt 173.0 lb

## 2016-07-07 DIAGNOSIS — C7951 Secondary malignant neoplasm of bone: Secondary | ICD-10-CM | POA: Diagnosis not present

## 2016-07-07 DIAGNOSIS — Z5111 Encounter for antineoplastic chemotherapy: Secondary | ICD-10-CM

## 2016-07-07 DIAGNOSIS — Z5189 Encounter for other specified aftercare: Secondary | ICD-10-CM

## 2016-07-07 DIAGNOSIS — C61 Malignant neoplasm of prostate: Secondary | ICD-10-CM

## 2016-07-07 DIAGNOSIS — D649 Anemia, unspecified: Secondary | ICD-10-CM | POA: Diagnosis not present

## 2016-07-07 LAB — PSA: PSA: 2.86 ng/mL (ref 0.00–4.00)

## 2016-07-07 LAB — CBC WITH DIFFERENTIAL/PLATELET
BASOS PCT: 0 %
Basophils Absolute: 0 10*3/uL (ref 0.0–0.1)
EOS ABS: 0 10*3/uL (ref 0.0–0.7)
EOS PCT: 1 %
HEMATOCRIT: 37.6 % — AB (ref 39.0–52.0)
Hemoglobin: 12.5 g/dL — ABNORMAL LOW (ref 13.0–17.0)
Lymphocytes Relative: 25 %
Lymphs Abs: 1.7 10*3/uL (ref 0.7–4.0)
MCH: 31.5 pg (ref 26.0–34.0)
MCHC: 33.2 g/dL (ref 30.0–36.0)
MCV: 94.7 fL (ref 78.0–100.0)
MONO ABS: 0.7 10*3/uL (ref 0.1–1.0)
MONOS PCT: 11 %
Neutro Abs: 4.2 10*3/uL (ref 1.7–7.7)
Neutrophils Relative %: 63 %
PLATELETS: 314 10*3/uL (ref 150–400)
RBC: 3.97 MIL/uL — ABNORMAL LOW (ref 4.22–5.81)
RDW: 15.9 % — AB (ref 11.5–15.5)
WBC: 6.7 10*3/uL (ref 4.0–10.5)

## 2016-07-07 LAB — COMPREHENSIVE METABOLIC PANEL
ALBUMIN: 4 g/dL (ref 3.5–5.0)
ALT: 16 U/L — ABNORMAL LOW (ref 17–63)
ANION GAP: 7 (ref 5–15)
AST: 23 U/L (ref 15–41)
Alkaline Phosphatase: 60 U/L (ref 38–126)
BUN: 17 mg/dL (ref 6–20)
CO2: 23 mmol/L (ref 22–32)
Calcium: 8.9 mg/dL (ref 8.9–10.3)
Chloride: 105 mmol/L (ref 101–111)
Creatinine, Ser: 1.37 mg/dL — ABNORMAL HIGH (ref 0.61–1.24)
GFR calc Af Amer: 59 mL/min — ABNORMAL LOW (ref >60)
GFR calc non Af Amer: 51 mL/min — ABNORMAL LOW (ref >60)
GLUCOSE: 91 mg/dL (ref 65–99)
POTASSIUM: 4.2 mmol/L (ref 3.5–5.1)
SODIUM: 135 mmol/L (ref 135–145)
TOTAL PROTEIN: 8.5 g/dL — AB (ref 6.5–8.1)
Total Bilirubin: 0.3 mg/dL (ref 0.3–1.2)

## 2016-07-07 MED ORDER — HEPARIN SOD (PORK) LOCK FLUSH 100 UNIT/ML IV SOLN
INTRAVENOUS | Status: AC
  Filled 2016-07-07: qty 5

## 2016-07-07 MED ORDER — PEGFILGRASTIM 6 MG/0.6ML ~~LOC~~ PSKT
6.0000 mg | PREFILLED_SYRINGE | Freq: Once | SUBCUTANEOUS | Status: AC
  Administered 2016-07-07: 6 mg via SUBCUTANEOUS

## 2016-07-07 MED ORDER — DEXAMETHASONE SODIUM PHOSPHATE 10 MG/ML IJ SOLN
10.0000 mg | Freq: Once | INTRAMUSCULAR | Status: AC
  Administered 2016-07-07: 10 mg via INTRAVENOUS
  Filled 2016-07-07: qty 1

## 2016-07-07 MED ORDER — DIPHENHYDRAMINE HCL 50 MG/ML IJ SOLN
25.0000 mg | Freq: Once | INTRAMUSCULAR | Status: AC
  Administered 2016-07-07: 25 mg via INTRAVENOUS
  Filled 2016-07-07: qty 1

## 2016-07-07 MED ORDER — SODIUM CHLORIDE 0.9 % IV SOLN
Freq: Once | INTRAVENOUS | Status: AC
  Administered 2016-07-07: 11:00:00 via INTRAVENOUS

## 2016-07-07 MED ORDER — OXYCODONE HCL 5 MG PO TABS: ORAL_TABLET | ORAL | 0 refills | Status: DC

## 2016-07-07 MED ORDER — CABAZITAXEL CHEMO INJECTION 60 MG/6ML W/DILUENT
20.0000 mg/m2 | Freq: Once | INTRAVENOUS | Status: AC
  Administered 2016-07-07: 41 mg via INTRAVENOUS
  Filled 2016-07-07: qty 4.1

## 2016-07-07 MED ORDER — HEPARIN SOD (PORK) LOCK FLUSH 100 UNIT/ML IV SOLN
500.0000 [IU] | Freq: Once | INTRAVENOUS | Status: AC | PRN
  Administered 2016-07-07: 500 [IU]
  Filled 2016-07-07: qty 5

## 2016-07-07 MED ORDER — SODIUM CHLORIDE 0.9% FLUSH: 10.0000 mL | INTRAVENOUS | Status: DC | PRN

## 2016-07-07 MED ORDER — FAMOTIDINE IN NACL 20-0.9 MG/50ML-% IV SOLN
20.0000 mg | Freq: Once | INTRAVENOUS | Status: AC
  Administered 2016-07-07: 20 mg via INTRAVENOUS
  Filled 2016-07-07: qty 50

## 2016-07-07 MED ORDER — SODIUM CHLORIDE 0.9 % IV SOLN: 10.0000 mg | Freq: Once | INTRAVENOUS | Status: DC

## 2016-07-07 NOTE — Patient Instructions (Addendum)
[place] at [facility] Discharge Instructions  RECOMMENDATIONS MADE BY THE CONSULTANT AND ANY TEST RESULTS WILL BE SENT TO YOUR REFERRING PHYSICIAN.  You were seen today by Dr. Twana First Follow up in 3 weeks. You will go to lab prior to treatment appointment See Amy up front for appointments   Thank you for choosing [place] at [facility] to provide your oncology and hematology care.  To afford each patient quality time with our provider, please arrive at least 15 minutes before your scheduled appointment time.    If you have a lab appointment with the [facility] please come in thru the  Main Entrance and check in at the main information desk  You need to re-schedule your appointment should you arrive 10 or more minutes late.  We strive to give you quality time with our providers, and arriving late affects you and other patients whose appointments are after yours.  Also, if you no show three or more times for appointments you may be dismissed from the clinic at the providers discretion.     Again, thank you for choosing [facility].  Our hope is that these requests will decrease the amount of time that you wait before being seen by our physicians.       _____________________________________________________________  Should you have questions after your visit to [facility], please contact our office at [phone] between the hours of 8:30 a.m. and 4:30 p.m.  Voicemails left after 4:30 p.m. will not be returned until the following business day.  For prescription refill requests, have your pharmacy contact our office.       Resources For Cancer Patients and their Caregivers ? American Cancer Society: Can assist with transportation, wigs, general needs, runs Look Good Feel Better.        2514010304 ? Cancer Care: Provides financial assistance, online support groups, medication/co-pay assistance.   1-800-813-HOPE (203)684-6726) ? [facility] Assists [place] cancer patients and their families through emotional , educational and financial support.  629-386-9223 ? [place] DSS Where to apply for food stamps, Medicaid and utility assistance. 985-768-3513 ? RCATS: Transportation to medical appointments. 912-588-2997 ? Social Security Administration: May apply for disability if have a Stage IV cancer. 706-258-1189 (208) 316-6544 ? [employer], Disability and Transit Services: Assists with nutrition, care and transit needs. [place] Support Programs: @10RELATIVEDAYS @ > Cancer Support Group  2nd Tuesday of the month 1pm-2pm, Journey Room  > Creative Journey  3rd Tuesday of the month 1130am-1pm, Journey Room  > Look Good Feel Better  1st Wednesday of the month 10am-12 noon, Journey Room (Call [facility] to register [phone])

## 2016-07-07 NOTE — Progress Notes (Signed)
Tolerated infusion w/o adverse reaction.  Alert, in no distress.  VSS.  Discharged ambulatory.  

## 2016-07-07 NOTE — Patient Instructions (Signed)
[facility] Discharge Instructions for Patients Receiving Chemotherapy   Beginning January 23rd 2017 lab work for the [facility] will be done in the  Main lab at [facility] on 1st floor. If you have a lab appointment with the [facility] please come in thru the  Main Entrance and check in at the main information desk   Today you received the following chemotherapy agents:  Jevtana  If you develop nausea and vomiting, or diarrhea that is not controlled by your medication, call the clinic.  The clinic phone number is [phone]. Office hours are Monday-Friday 8:30am-5:00pm.  BELOW ARE SYMPTOMS THAT SHOULD BE REPORTED IMMEDIATELY:  *FEVER GREATER THAN 101.0 F  *CHILLS WITH OR WITHOUT FEVER  NAUSEA AND VOMITING THAT IS NOT CONTROLLED WITH YOUR NAUSEA MEDICATION  *UNUSUAL SHORTNESS OF BREATH  *UNUSUAL BRUISING OR BLEEDING  TENDERNESS IN MOUTH AND THROAT WITH OR WITHOUT PRESENCE OF ULCERS  *URINARY PROBLEMS  *BOWEL PROBLEMS  UNUSUAL RASH Items with * indicate a potential emergency and should be followed up as soon as possible. If you have an emergency after office hours please contact your primary care physician or go to the nearest emergency department.  Please call the clinic during office hours if you have any questions or concerns.   You may also contact the Patient Navigator at [phone] should you have any questions or need assistance in obtaining follow up care.      Resources For Cancer Patients and their Caregivers ? American Cancer Society: Can assist with transportation, wigs, general needs, runs Look Good Feel Better.        431-146-2737 ? Cancer Care: Provides financial assistance, online support groups, medication/co-pay assistance.  1-800-813-HOPE 4012193740) ? [facility] Assists [place] cancer patients and their families through emotional , educational and financial support.   (905) 576-6736 ? [place] DSS Where to apply for food stamps, Medicaid and utility assistance. (217) 154-6998 ? RCATS: Transportation to medical appointments. 5124865120 ? Social Security Administration: May apply for disability if have a Stage IV cancer. 818-303-9472 (503)175-6825 ? [employer], Disability and Transit Services: Assists with nutrition, care and transit needs. [phone]

## 2016-07-07 NOTE — Progress Notes (Signed)
No PCP Per Patient No address on file  Stage IV Adenocarcinoma of the prostate, Hormone Sensitive Disease Bilateral orchiectomy 01/2014 Taxotere/Xgeva started 02/2014 Bilateral nephrostomy tube placement 01/2014 Nephrostomy tube removal 07/23/2014    Prostate cancer metastatic to multiple sites ([place])   01/26/2014 Tumor Marker    PSA > 5000      01/28/2014 Initial Biopsy    Metastatic adenocarcinoma of prostate      02/05/2014 Surgery    Bilateral orchiectomy by Dr. Junious Silk      02/26/2014 Tumor Marker    PSA = 399.5      03/14/2014 Tumor Marker    PSA= 89.51      03/14/2014 - 06/26/2014 Chemotherapy    Docetaxel 75 mg/kg every 21 days x 6 cycles      04/24/2014 Tumor Marker    PSA- 19.89      07/23/2014 Procedure    Nephrostomy tube removed by IR, Dr. Geroge Baseman      07/24/2014 Tumor Marker    PSA= 6.15      10/16/2014 Tumor Marker    PSA: 3.54       01/08/2015 Tumor Marker    PSA: 2.13       01/17/2015 Imaging    CT CAP-  Massive pelvic and retroperitoneal lymphadenopathy noted on the prior study has nearly completely resolved, now with only a small amount of residual amorphous soft tissue predominantly around the the infrarenal abdominal aorta.      01/17/2015 Imaging    Bone scan- Widespread osseous metastatic disease with multiple foci of increased activity throughout the skeleton, corresponding with the findings on the prior CT from October, 2015.      04/11/2015 Tumor Marker    PSA: 1.87       07/21/2015 Imaging    Bone scan- Bony metastatic disease again noted at multiple sites, stable from prior study. No progression of bony metastatic disease is demonstrable on this study.      07/25/2015 Imaging    CT CAP- Stable matted soft tissue density in the retroperitoneum and extraperitoneal pelvis consistent with treated disease. No recurrent lymphadenopathy.  No pulmonary metastatic disease. Diffuse stable sclerotic metastatic bone disease.      01/14/2016 Imaging    Bone scan-  Multiple sites of abnormal increased tracer localization involving BILATERAL ribs, T11, T12, questionably RIGHT scapula and LEFT iliac bone suspicious for osseous metastases.      01/23/2016 Imaging    CT CAP- 1. Similar widespread osseous metastasis. 2. Similar soft tissue thickening within the retroperitoneum of the abdomen. Improved left pelvic side wall soft tissue thickening. These are consistent with sites of treated disease. No well-defined adenopathy. 3. No new sites of disease.      05/03/2016 -  Chemotherapy    Jevtana every 21 days        INTERVAL HISTORY: Jeff [name] 71 y.o. male returns for follow-up of his hormone refractory stage IV prostate cancer. At time of diagnosis he had stage IV disease, treated with orchiectomy and taxotere from 03/14/2014- 06/26/2014 with excellent response. Unfortunately PSA has begun to rise and has now been started on jevtana. [name] has an excellent PS.  He presents for follow up today. The last 3 days he has been getting dizzy and hot flashes at work. He also had a lot of diarrhea. Today he feels fine. He is doing well with the Neulasta. Denies bone pain, chest pain, abdominal pain, constipation, SOB, loss of appetite, or any other concerns.  MEDICAL HISTORY: Past Medical History:  Diagnosis Date  . Acute renal failure ([place]) 01/26/2014  . Alcohol abuse   . Anemia of chronic disease 01/26/2014  . Bilateral hydronephrosis 01/26/2014  . History of cocaine abuse 01/26/2014  . Homelessness 01/31/2014  . Prostate cancer metastatic to multiple sites ([place]) 01/30/2014  . Sepsis ([place])   . UTI (lower urinary tract infection)     has Bone lesion; Acute renal failure ([place]); Bilateral hydronephrosis; Anemia of chronic disease; History of cocaine abuse; Prostate cancer metastatic to multiple sites [facility]); Homelessness; Health care maintenance; Sepsis ([place]); UTI (lower urinary tract infection); Malnutrition of moderate  degree ([place]); Bacteremia; Pyrexia; and B12 deficiency on his problem list.      has No Known Allergies.  [name] had no medications administered during this visit.  SURGICAL HISTORY: Past Surgical History:  Procedure Laterality Date  . ORCHIECTOMY Bilateral 02/05/2014   Procedure: BILATERAL ORCHIECTOMY;  Surgeon: Festus Aloe, MD;  Location: [facility];  Service: Urology;  Laterality: Bilateral;  . PERCUTANEOUS NEPHROSTOMY Bilateral    IR Dr. Junious Silk changed on 04/22/2014  . PORTACATH PLACEMENT Right 03/12/14    SOCIAL HISTORY: Social History   Social History  . Marital status: Single    Spouse name: N/A  . Number of children: 1  . Years of education: 9th   Occupational History  . disabilty [employer]   Social History Main Topics  . Smoking status: Current Every Day Smoker    Packs/day: 0.25    Years: 55.00    Types: Cigarettes  . Smokeless tobacco: Never Used  . Alcohol use No     Comment: none for at least 7 -8 years  . Drug use: Yes    Types: Cocaine     Comment: last used Cocaine 01/25/14  . Sexual activity: Yes   Other Topics Concern  . Not on file   Social History Narrative   Lives at [employer] 9th grade   1 son, lives in [place], [place]   Can read and write in native language             FAMILY HISTORY: Family History  Problem Relation Age of Onset  . Cancer Brother 60    Review of Systems  Constitutional:       Hot flashes No loss of appetite  HENT: Negative.   Eyes: Negative.   Respiratory: Negative.  Negative for shortness of breath.   Cardiovascular: Negative.  Negative for chest pain.  Gastrointestinal: Positive for diarrhea. Negative for abdominal pain and constipation.  Genitourinary: Negative.   Musculoskeletal: Negative.        No bone pain  Skin: Negative.   Neurological: Positive for dizziness.  Endo/Heme/Allergies: Negative.   Psychiatric/Behavioral: Negative.   All other systems reviewed  and are negative. 14 point review of systems was performed and is negative except as detailed under history of present illness and above  PHYSICAL EXAMINATION  ECOG PERFORMANCE STATUS: 1 - Symptomatic but completely ambulatory   Physical Exam  Constitutional: He is oriented to person, place, and time and well-developed, well-nourished, and in no distress.  HENT:  Head: Normocephalic and atraumatic.  Mouth/Throat: No oropharyngeal exudate.  Poor dentition  Eyes: Conjunctivae and EOM are normal. Pupils are equal, round, and reactive to light. No scleral icterus.  Neck: Normal range of motion. Neck supple.  Cardiovascular: Normal rate, regular rhythm and normal heart sounds.   Pulmonary/Chest: Effort normal and breath sounds normal.  Abdominal: Soft.  Bowel sounds are normal. He exhibits no distension and no mass. There is no tenderness. There is no rebound and no guarding.  Musculoskeletal: Normal range of motion.  Lymphadenopathy:    He has no cervical adenopathy.  Neurological: He is alert and oriented to person, place, and time. No cranial nerve deficit. Gait normal.  Skin: Skin is warm and dry.  Psychiatric: Mood, memory, affect and judgment normal.  Nursing note and vitals reviewed.  LABORATORY DATA: I have reviewed the labs below. CBC Latest Ref Rng & Units 07/07/2016 06/28/2016 06/14/2016  WBC 4.0 - 10.5 K/uL 6.7 18.5(H) 4.6  Hemoglobin 13.0 - 17.0 g/dL 12.5(L) 12.0(L) 10.8(L)  Hematocrit 39.0 - 52.0 % 37.6(L) 36.6(L) 31.9(L)  Platelets 150 - 400 K/uL 314 284 382   CMP Latest Ref Rng & Units 07/07/2016 06/28/2016 06/14/2016  Glucose 65 - 99 mg/dL 91 70 101(H)  BUN 6 - 20 mg/dL 17 14 22(H)  Creatinine 0.61 - 1.24 mg/dL 1.37(H) 1.63(H) 1.69(H)  Sodium 135 - 145 mmol/L 135 136 134(L)  Potassium 3.5 - 5.1 mmol/L 4.2 4.5 4.6  Chloride 101 - 111 mmol/L 105 105 106  CO2 22 - 32 mmol/L 23 25 23   Calcium 8.9 - 10.3 mg/dL 8.9 9.0 8.9  Total Protein 6.5 - 8.1 g/dL 8.5(H) 8.4(H) 8.1    Total Bilirubin 0.3 - 1.2 mg/dL 0.3 0.2(L) 0.4  Alkaline Phos 38 - 126 U/L 60 100 61  AST 15 - 41 U/L 23 29 25   ALT 17 - 63 U/L 16(L) 20 17   RADIOLOGY: I have personally reviewed the radiological images as listed and agreed with the findings in the report.  CT CAP w Contrast 01/22/2016 IMPRESSION: 1. Similar widespread osseous metastasis. 2. Similar soft tissue thickening within the retroperitoneum of the abdomen. Improved left pelvic side wall soft tissue thickening. These are consistent with sites of treated disease. No well-defined adenopathy. 3. No new sites of disease. 4.  Coronary artery atherosclerosis. Aortic atherosclerosis. 5. Grossly similar interstitial lung disease which could be idiopathic or related to chemotherapy. 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 6. 9 mm pancreatic cystic lesion is most likely a pseudocyst and is similar. Indolent neoplasm could look similar. Recommend attention on follow-up.  ASSESSMENT and THERAPY PLAN:   Stage IV prostate cancer, hormone refractory disease. HX taxotere HX Bilateral nephrostomy tubes Bilateral orchiectomy History of polysubstance abuse  Anemia, multifactorial  Tobacco Use   71 year old male with stage IV adenocarcinoma of the prostate, hormone refractory disease. Originally diagnosed in late 2015.   He had a bilateral orchiectomy. He continues on XGEVA monthly for bone metastases. He was again advised to take calcium and vitamin D daily.   PLAN: Tolerating jevtana well. PSA decreasing. Proceed with cycle 4 of Jevtana today.   Refill pain medication.  He will return for follow up in 3 weeks, CBC, CMP, PSA on his next visit.  All questions were answered. The patient knows to call the clinic with any problems, questions or concerns. We can certainly see the patient much sooner if necessary.  This document serves as a record of services personally performed by Twana First, MD. It was created on her  behalf by Martinique Casey, a trained medical scribe. The creation of this record is based on the scribe's personal observations and the provider's statements to them. This document has been checked and approved by the attending provider.  I have reviewed the above documentation for accuracy and completeness, and I agree with the above.  [place]  Sanda Klein  07/07/2016

## 2016-07-12 ENCOUNTER — Encounter (HOSPITAL_BASED_OUTPATIENT_CLINIC_OR_DEPARTMENT_OTHER): Payer: Medicare Other

## 2016-07-12 ENCOUNTER — Encounter (HOSPITAL_COMMUNITY): Payer: Self-pay

## 2016-07-12 ENCOUNTER — Encounter (HOSPITAL_COMMUNITY): Payer: Medicare Other

## 2016-07-12 VITALS — BP 100/87 | HR 86 | Temp 97.8°F | Resp 18

## 2016-07-12 DIAGNOSIS — C61 Malignant neoplasm of prostate: Secondary | ICD-10-CM | POA: Diagnosis not present

## 2016-07-12 DIAGNOSIS — C7951 Secondary malignant neoplasm of bone: Secondary | ICD-10-CM

## 2016-07-12 DIAGNOSIS — M899 Disorder of bone, unspecified: Secondary | ICD-10-CM

## 2016-07-12 LAB — COMPREHENSIVE METABOLIC PANEL
ALT: 14 U/L — AB (ref 17–63)
ANION GAP: 7 (ref 5–15)
AST: 20 U/L (ref 15–41)
Albumin: 4 g/dL (ref 3.5–5.0)
Alkaline Phosphatase: 117 U/L (ref 38–126)
BUN: 20 mg/dL (ref 6–20)
CHLORIDE: 105 mmol/L (ref 101–111)
CO2: 24 mmol/L (ref 22–32)
CREATININE: 1.47 mg/dL — AB (ref 0.61–1.24)
Calcium: 9 mg/dL (ref 8.9–10.3)
GFR calc non Af Amer: 47 mL/min — ABNORMAL LOW (ref >60)
GFR, EST AFRICAN AMERICAN: 54 mL/min — AB (ref >60)
Glucose, Bld: 99 mg/dL (ref 65–99)
Potassium: 4.8 mmol/L (ref 3.5–5.1)
SODIUM: 136 mmol/L (ref 135–145)
Total Bilirubin: 0.5 mg/dL (ref 0.3–1.2)
Total Protein: 8.3 g/dL — ABNORMAL HIGH (ref 6.5–8.1)

## 2016-07-12 LAB — CBC WITH DIFFERENTIAL/PLATELET
Basophils Absolute: 0.3 10*3/uL — ABNORMAL HIGH (ref 0.0–0.1)
Basophils Relative: 1 %
EOS ABS: 0.1 10*3/uL (ref 0.0–0.7)
EOS PCT: 1 %
HCT: 34.9 % — ABNORMAL LOW (ref 39.0–52.0)
Hemoglobin: 11.8 g/dL — ABNORMAL LOW (ref 13.0–17.0)
LYMPHS ABS: 2.3 10*3/uL (ref 0.7–4.0)
Lymphocytes Relative: 10 %
MCH: 31.6 pg (ref 26.0–34.0)
MCHC: 33.8 g/dL (ref 30.0–36.0)
MCV: 93.3 fL (ref 78.0–100.0)
Monocytes Absolute: 0.7 10*3/uL (ref 0.1–1.0)
Monocytes Relative: 3 %
Neutro Abs: 20.4 10*3/uL — ABNORMAL HIGH (ref 1.7–7.7)
Neutrophils Relative %: 85 %
PLATELETS: 242 10*3/uL (ref 150–400)
RBC: 3.74 MIL/uL — AB (ref 4.22–5.81)
RDW: 15.7 % — ABNORMAL HIGH (ref 11.5–15.5)
WBC: 23.8 10*3/uL — AB (ref 4.0–10.5)

## 2016-07-12 LAB — PSA: PSA: 2.61 ng/mL (ref 0.00–4.00)

## 2016-07-12 MED ORDER — DENOSUMAB 120 MG/1.7ML ~~LOC~~ SOLN
120.0000 mg | Freq: Once | SUBCUTANEOUS | Status: AC
  Administered 2016-07-12: 120 mg via SUBCUTANEOUS
  Filled 2016-07-12: qty 1.7

## 2016-07-12 NOTE — Patient Instructions (Signed)
[facility] at [facility] Discharge Instructions  RECOMMENDATIONS MADE BY THE CONSULTANT AND ANY TEST RESULTS WILL BE SENT TO YOUR REFERRING PHYSICIAN.  Xgeva injection today. Return as scheduled.   Thank you for choosing [facility] at [facility] to provide your oncology and hematology care.  To afford each patient quality time with our provider, please arrive at least 15 minutes before your scheduled appointment time.    If you have a lab appointment with the Cancer Center please come in thru the  Main Entrance and check in at the main information desk  You need to re-schedule your appointment should you arrive 10 or more minutes late.  We strive to give you quality time with our providers, and arriving late affects you and other patients whose appointments are after yours.  Also, if you no show three or more times for appointments you may be dismissed from the clinic at the providers discretion.     Again, thank you for choosing  Cancer Center.  Our hope is that these requests will decrease the amount of time that you wait before being seen by our physicians.       _____________________________________________________________  Should you have questions after your visit to  Cancer Center, please contact our office at [phone] between the hours of 8:30 a.m. and 4:30 p.m.  Voicemails left after 4:30 p.m. will not be returned until the following business day.  For prescription refill requests, have your pharmacy contact our office.       Resources For Cancer Patients and their Caregivers ? American Cancer Society: Can assist with transportation, wigs, general needs, runs Look Good Feel Better.        1-888-227-6333 ? Cancer Care: Provides financial assistance, online support groups, medication/co-pay assistance.  1-800-813-HOPE (4673) ? [facility] Assists [place] cancer patients and  their families through emotional , educational and financial support.  336-427-4357 ? [place] DSS Where to apply for food stamps, Medicaid and utility assistance. 336-342-1394 ? RCATS: Transportation to medical appointments. 336-347-2287 ? Social Security Administration: May apply for disability if have a Stage IV cancer. 336-342-7796 1-800-772-1213 ? [place] Aging, Disability and Transit Services: Assists with nutrition, care and transit needs. 336-349-2343  Cancer Center Support Programs: @10RELATIVEDAYS@ > Cancer Support Group  2nd Tuesday of the month 1pm-2pm, Journey Room  > Creative Journey  3rd Tuesday of the month 1130am-1pm, Journey Room  > Look Good Feel Better  1st Wednesday of the month 10am-12 noon, Journey Room (Call American Cancer Society to register 1-800-395-5775)   

## 2016-07-12 NOTE — Progress Notes (Signed)
Jeff [name] presents today for injection per the provider's orders.  Xgeva administration without incident; see MAR for injection details.  Patient tolerated procedure well and without incident.  No questions or complaints noted at this time.  Discharged ambulatory.

## 2016-07-16 ENCOUNTER — Ambulatory Visit (HOSPITAL_COMMUNITY): Payer: Medicare Other | Admitting: Oncology

## 2016-07-16 ENCOUNTER — Ambulatory Visit (HOSPITAL_COMMUNITY): Payer: Medicare Other

## 2016-07-26 ENCOUNTER — Encounter (HOSPITAL_COMMUNITY): Payer: Medicare Other

## 2016-07-26 ENCOUNTER — Encounter (HOSPITAL_COMMUNITY): Payer: Medicare Other | Attending: Oncology

## 2016-07-26 ENCOUNTER — Other Ambulatory Visit (HOSPITAL_COMMUNITY): Payer: Self-pay | Admitting: Oncology

## 2016-07-26 VITALS — BP 128/94 | HR 76 | Temp 97.3°F | Resp 18

## 2016-07-26 DIAGNOSIS — C61 Malignant neoplasm of prostate: Secondary | ICD-10-CM

## 2016-07-26 DIAGNOSIS — M899 Disorder of bone, unspecified: Secondary | ICD-10-CM

## 2016-07-26 DIAGNOSIS — E538 Deficiency of other specified B group vitamins: Secondary | ICD-10-CM

## 2016-07-26 LAB — COMPREHENSIVE METABOLIC PANEL
ALBUMIN: 4 g/dL (ref 3.5–5.0)
ALK PHOS: 72 U/L (ref 38–126)
ALT: 20 U/L (ref 17–63)
AST: 22 U/L (ref 15–41)
Anion gap: 8 (ref 5–15)
BILIRUBIN TOTAL: 0.5 mg/dL (ref 0.3–1.2)
BUN: 21 mg/dL — AB (ref 6–20)
CO2: 24 mmol/L (ref 22–32)
CREATININE: 1.71 mg/dL — AB (ref 0.61–1.24)
Calcium: 9.2 mg/dL (ref 8.9–10.3)
Chloride: 103 mmol/L (ref 101–111)
GFR calc Af Amer: 45 mL/min — ABNORMAL LOW (ref >60)
GFR, EST NON AFRICAN AMERICAN: 39 mL/min — AB (ref >60)
GLUCOSE: 97 mg/dL (ref 65–99)
Potassium: 4.6 mmol/L (ref 3.5–5.1)
Sodium: 135 mmol/L (ref 135–145)
TOTAL PROTEIN: 8.2 g/dL — AB (ref 6.5–8.1)

## 2016-07-26 LAB — CBC WITH DIFFERENTIAL/PLATELET
BASOS ABS: 0 10*3/uL (ref 0.0–0.1)
BASOS PCT: 0 %
Eosinophils Absolute: 0 10*3/uL (ref 0.0–0.7)
Eosinophils Relative: 0 %
HEMATOCRIT: 36.9 % — AB (ref 39.0–52.0)
HEMOGLOBIN: 12.3 g/dL — AB (ref 13.0–17.0)
LYMPHS PCT: 27 %
Lymphs Abs: 2.4 10*3/uL (ref 0.7–4.0)
MCH: 31.4 pg (ref 26.0–34.0)
MCHC: 33.3 g/dL (ref 30.0–36.0)
MCV: 94.1 fL (ref 78.0–100.0)
MONO ABS: 0.8 10*3/uL (ref 0.1–1.0)
Monocytes Relative: 9 %
NEUTROS ABS: 5.7 10*3/uL (ref 1.7–7.7)
NEUTROS PCT: 64 %
Platelets: 214 10*3/uL (ref 150–400)
RBC: 3.92 MIL/uL — AB (ref 4.22–5.81)
RDW: 16.2 % — ABNORMAL HIGH (ref 11.5–15.5)
WBC: 8.9 10*3/uL (ref 4.0–10.5)

## 2016-07-26 MED ORDER — CYANOCOBALAMIN 1000 MCG/ML IJ SOLN
1000.0000 ug | Freq: Once | INTRAMUSCULAR | Status: AC
  Administered 2016-07-26: 1000 ug via INTRAMUSCULAR
  Filled 2016-07-26: qty 1

## 2016-07-26 NOTE — Progress Notes (Signed)
Jeff [name] presents today for injection per MD orders. B12 1000 mcg administered IM in left Upper Arm. Administration without incident. Patient tolerated well.  Patient states he needs refill on oxycontin. Last refill was 07/07/16 #90 tablets. Questioned patient how he was taking medication. He reports he takes 2 if his back is hurting at work and sometimes he might take them at bedtime just depends on how he feels. States "I just take them when I need them". Informed Dr.Zhou of above. She will not refill oxycontin at this time, it is too soon and he should not be out of medication yet. Informed patient MD will not refill RX at this time and he verbalized understanding.  Stable and ambulatory on discharge home to self.

## 2016-07-26 NOTE — Patient Instructions (Signed)
[facility] at [facility] Discharge Instructions  RECOMMENDATIONS MADE BY THE CONSULTANT AND ANY TEST RESULTS WILL BE SENT TO YOUR REFERRING PHYSICIAN.  Vitamin B12 1000 mcg injection given today as ordered. Return as scheduled.  Thank you for choosing [facility] at [facility] to provide your oncology and hematology care.  To afford each patient quality time with our provider, please arrive at least 15 minutes before your scheduled appointment time.    If you have a lab appointment with the Cancer Center please come in thru the  Main Entrance and check in at the main information desk  You need to re-schedule your appointment should you arrive 10 or more minutes late.  We strive to give you quality time with our providers, and arriving late affects you and other patients whose appointments are after yours.  Also, if you no show three or more times for appointments you may be dismissed from the clinic at the providers discretion.     Again, thank you for choosing [facility].  Our hope is that these requests will decrease the amount of time that you wait before being seen by our physicians.       _____________________________________________________________  Should you have questions after your visit to [facility], please contact our office at [phone] between the hours of 8:30 a.m. and 4:30 p.m.  Voicemails left after 4:30 p.m. will not be returned until the following business day.  For prescription refill requests, have your pharmacy contact our office.       Resources For Cancer Patients and their Caregivers ? American Cancer Society: Can assist with transportation, wigs, general needs, runs Look Good Feel Better.        1-888-227-6333 ? Cancer Care: Provides financial assistance, online support groups, medication/co-pay assistance.  1-800-813-HOPE (4673) ? [facility] Assists  [place] cancer patients and their families through emotional , educational and financial support.  336-427-4357 ? [place] DSS Where to apply for food stamps, Medicaid and utility assistance. 336-342-1394 ? RCATS: Transportation to medical appointments. 336-347-2287 ? Social Security Administration: May apply for disability if have a Stage IV cancer. 336-342-7796 1-800-772-1213 ? [place] Aging, Disability and Transit Services: Assists with nutrition, care and transit needs. 336-349-2343  Cancer Center Support Programs: @10RELATIVEDAYS@ > Cancer Support Group  2nd Tuesday of the month 1pm-2pm, Journey Room  > Creative Journey  3rd Tuesday of the month 1130am-1pm, Journey Room  > Look Good Feel Better  1st Wednesday of the month 10am-12 noon, Journey Room (Call American Cancer Society to register 1-800-395-5775)   

## 2016-07-28 ENCOUNTER — Encounter (HOSPITAL_BASED_OUTPATIENT_CLINIC_OR_DEPARTMENT_OTHER): Payer: Medicare Other | Admitting: Oncology

## 2016-07-28 ENCOUNTER — Encounter (HOSPITAL_COMMUNITY): Payer: Self-pay

## 2016-07-28 ENCOUNTER — Encounter (HOSPITAL_BASED_OUTPATIENT_CLINIC_OR_DEPARTMENT_OTHER): Payer: Medicare Other

## 2016-07-28 ENCOUNTER — Other Ambulatory Visit (HOSPITAL_COMMUNITY): Payer: Medicare Other

## 2016-07-28 VITALS — BP 106/71 | HR 68 | Temp 97.4°F | Resp 18 | Wt 170.6 lb

## 2016-07-28 DIAGNOSIS — C61 Malignant neoplasm of prostate: Secondary | ICD-10-CM | POA: Diagnosis not present

## 2016-07-28 DIAGNOSIS — C7951 Secondary malignant neoplasm of bone: Secondary | ICD-10-CM

## 2016-07-28 DIAGNOSIS — D649 Anemia, unspecified: Secondary | ICD-10-CM | POA: Diagnosis not present

## 2016-07-28 DIAGNOSIS — Z5111 Encounter for antineoplastic chemotherapy: Secondary | ICD-10-CM

## 2016-07-28 DIAGNOSIS — Z5189 Encounter for other specified aftercare: Secondary | ICD-10-CM | POA: Diagnosis not present

## 2016-07-28 MED ORDER — SODIUM CHLORIDE 0.9 % IV SOLN
Freq: Once | INTRAVENOUS | Status: AC
  Administered 2016-07-28: 10:00:00 via INTRAVENOUS

## 2016-07-28 MED ORDER — HEPARIN SOD (PORK) LOCK FLUSH 100 UNIT/ML IV SOLN
500.0000 [IU] | Freq: Once | INTRAVENOUS | Status: AC | PRN
  Administered 2016-07-28: 500 [IU]
  Filled 2016-07-28: qty 5

## 2016-07-28 MED ORDER — SODIUM CHLORIDE 0.9 % IV SOLN: 10.0000 mg | Freq: Once | INTRAVENOUS | Status: DC

## 2016-07-28 MED ORDER — PEGFILGRASTIM 6 MG/0.6ML ~~LOC~~ PSKT
6.0000 mg | PREFILLED_SYRINGE | Freq: Once | SUBCUTANEOUS | Status: AC
  Administered 2016-07-28: 6 mg via SUBCUTANEOUS
  Filled 2016-07-28: qty 0.6

## 2016-07-28 MED ORDER — DEXAMETHASONE SODIUM PHOSPHATE 10 MG/ML IJ SOLN
10.0000 mg | Freq: Once | INTRAMUSCULAR | Status: AC
  Administered 2016-07-28: 10 mg via INTRAVENOUS
  Filled 2016-07-28: qty 1

## 2016-07-28 MED ORDER — DIPHENHYDRAMINE HCL 50 MG/ML IJ SOLN
25.0000 mg | Freq: Once | INTRAMUSCULAR | Status: AC
  Administered 2016-07-28: 25 mg via INTRAVENOUS
  Filled 2016-07-28: qty 1

## 2016-07-28 MED ORDER — FAMOTIDINE IN NACL 20-0.9 MG/50ML-% IV SOLN
20.0000 mg | Freq: Once | INTRAVENOUS | Status: AC
  Administered 2016-07-28: 20 mg via INTRAVENOUS
  Filled 2016-07-28: qty 50

## 2016-07-28 MED ORDER — DEXTROSE 5 % IV SOLN
20.0000 mg/m2 | Freq: Once | INTRAVENOUS | Status: AC
  Administered 2016-07-28: 41 mg via INTRAVENOUS
  Filled 2016-07-28: qty 4.1

## 2016-07-28 NOTE — Progress Notes (Signed)
No PCP Per Patient No address on file  Stage IV Adenocarcinoma of the prostate, Hormone Sensitive Disease Bilateral orchiectomy 01/2014 Taxotere/Xgeva started 02/2014 Bilateral nephrostomy tube placement 01/2014 Nephrostomy tube removal 07/23/2014    Prostate cancer metastatic to multiple sites ([place])   01/26/2014 Tumor Marker    PSA > 5000      01/28/2014 Initial Biopsy    Metastatic adenocarcinoma of prostate      02/05/2014 Surgery    Bilateral orchiectomy by Dr. Junious Silk      02/26/2014 Tumor Marker    PSA = 399.5      03/14/2014 Tumor Marker    PSA= 89.51      03/14/2014 - 06/26/2014 Chemotherapy    Docetaxel 75 mg/kg every 21 days x 6 cycles      04/24/2014 Tumor Marker    PSA- 19.89      07/23/2014 Procedure    Nephrostomy tube removed by IR, Dr. Geroge Baseman      07/24/2014 Tumor Marker    PSA= 6.15      10/16/2014 Tumor Marker    PSA: 3.54       01/08/2015 Tumor Marker    PSA: 2.13       01/17/2015 Imaging    CT CAP-  Massive pelvic and retroperitoneal lymphadenopathy noted on the prior study has nearly completely resolved, now with only a small amount of residual amorphous soft tissue predominantly around the the infrarenal abdominal aorta.      01/17/2015 Imaging    Bone scan- Widespread osseous metastatic disease with multiple foci of increased activity throughout the skeleton, corresponding with the findings on the prior CT from October, 2015.      04/11/2015 Tumor Marker    PSA: 1.87       07/21/2015 Imaging    Bone scan- Bony metastatic disease again noted at multiple sites, stable from prior study. No progression of bony metastatic disease is demonstrable on this study.      07/25/2015 Imaging    CT CAP- Stable matted soft tissue density in the retroperitoneum and extraperitoneal pelvis consistent with treated disease. No recurrent lymphadenopathy.  No pulmonary metastatic disease. Diffuse stable sclerotic metastatic bone disease.      01/14/2016 Imaging    Bone scan-  Multiple sites of abnormal increased tracer localization involving BILATERAL ribs, T11, T12, questionably RIGHT scapula and LEFT iliac bone suspicious for osseous metastases.      01/23/2016 Imaging    CT CAP- 1. Similar widespread osseous metastasis. 2. Similar soft tissue thickening within the retroperitoneum of the abdomen. Improved left pelvic side wall soft tissue thickening. These are consistent with sites of treated disease. No well-defined adenopathy. 3. No new sites of disease.      05/03/2016 -  Chemotherapy    Jevtana every 21 days        INTERVAL HISTORY: Treshon Stannard 71 y.o. male returns for follow-up of his hormone refractory stage IV prostate cancer. At time of diagnosis he had stage IV disease, treated with orchiectomy and taxotere from 03/14/2014- 06/26/2014 with excellent response. Unfortunately PSA has begun to rise and has now been started on jevtana. Elic has an excellent PS.  He is doing very well today. Denies diarrhea, chest pain, abdominal pain, SOB, loss of appetite, tingling, leg swelling, fatigue, or any other concerns.   MEDICAL HISTORY: Past Medical History:  Diagnosis Date  . Acute renal failure ([place]) 01/26/2014  . Alcohol abuse   . Anemia of chronic disease 01/26/2014  .  Bilateral hydronephrosis 01/26/2014  . History of cocaine abuse 01/26/2014  . Homelessness 01/31/2014  . Prostate cancer metastatic to multiple sites ([place]) 01/30/2014  . Sepsis ([place])   . UTI (lower urinary tract infection)     has Bone lesion; Acute renal failure ([place]); Bilateral hydronephrosis; Anemia of chronic disease; History of cocaine abuse; Prostate cancer metastatic to multiple sites [facility]); Homelessness; Health care maintenance; Sepsis ([place]); UTI (lower urinary tract infection); Malnutrition of moderate degree ([place]); Bacteremia; Pyrexia; and B12 deficiency on his problem list.     has No Known Allergies.  [name] had no medications  administered during this visit.  SURGICAL HISTORY: Past Surgical History:  Procedure Laterality Date  . ORCHIECTOMY Bilateral 02/05/2014   Procedure: BILATERAL ORCHIECTOMY;  Surgeon: Festus Aloe, MD;  Location: [facility];  Service: Urology;  Laterality: Bilateral;  . PERCUTANEOUS NEPHROSTOMY Bilateral    IR Dr. Junious Silk changed on 04/22/2014  . PORTACATH PLACEMENT Right 03/12/14    SOCIAL HISTORY: Social History   Social History  . Marital status: Single    Spouse name: N/A  . Number of children: 1  . Years of education: 9th   Occupational History  . disabilty [employer]   Social History Main Topics  . Smoking status: Current Every Day Smoker    Packs/day: 0.25    Years: 55.00    Types: Cigarettes  . Smokeless tobacco: Never Used  . Alcohol use No     Comment: none for at least 7 -8 years  . Drug use: Yes    Types: Cocaine     Comment: last used Cocaine 01/25/14  . Sexual activity: Yes   Other Topics Concern  . Not on file   Social History Narrative   Lives at [employer] 9th grade   1 son, lives in [place], [place]   Can read and write in native language             FAMILY HISTORY: Family History  Problem Relation Age of Onset  . Cancer Brother 60   Review of Systems  Constitutional: Negative.  Negative for malaise/fatigue.       No loss of appetite   HENT: Negative.   Eyes: Negative.   Respiratory: Negative.  Negative for shortness of breath.   Cardiovascular: Negative.  Negative for chest pain and leg swelling.  Gastrointestinal: Negative.  Negative for abdominal pain and diarrhea.  Genitourinary: Negative.   Musculoskeletal: Negative.   Skin: Negative.   Neurological: Negative.  Negative for tingling.  Endo/Heme/Allergies: Negative.   Psychiatric/Behavioral: Negative.   All other systems reviewed and are negative. 14 point review of systems was performed and is negative except as detailed under history of present illness  and above  PHYSICAL EXAMINATION ECOG PERFORMANCE STATUS: 1 - Symptomatic but completely ambulatory   Physical Exam  Constitutional: He is oriented to person, place, and time and well-developed, well-nourished, and in no distress.  HENT:  Head: Normocephalic and atraumatic.  Mouth/Throat: No oropharyngeal exudate.  Poor dentition  Eyes: Conjunctivae and EOM are normal. Pupils are equal, round, and reactive to light. No scleral icterus.  Neck: Normal range of motion. Neck supple.  Cardiovascular: Normal rate, regular rhythm and normal heart sounds.   Pulmonary/Chest: Effort normal and breath sounds normal.  Abdominal: Soft. Bowel sounds are normal. He exhibits no distension and no mass. There is no tenderness. There is no rebound and no guarding.  Musculoskeletal: Normal range of motion.  Lymphadenopathy:  He has no cervical adenopathy.  Neurological: He is alert and oriented to person, place, and time. No cranial nerve deficit. Gait normal.  Skin: Skin is warm and dry.  Psychiatric: Mood, memory, affect and judgment normal.  Nursing note and vitals reviewed.  LABORATORY DATA: I have reviewed the labs below. CBC Latest Ref Rng & Units 07/26/2016 07/12/2016 07/07/2016  WBC 4.0 - 10.5 K/uL 8.9 23.8(H) 6.7  Hemoglobin 13.0 - 17.0 g/dL 12.3(L) 11.8(L) 12.5(L)  Hematocrit 39.0 - 52.0 % 36.9(L) 34.9(L) 37.6(L)  Platelets 150 - 400 K/uL 214 242 314   CMP Latest Ref Rng & Units 07/26/2016 07/12/2016 07/07/2016  Glucose 65 - 99 mg/dL 97 99 91  BUN 6 - 20 mg/dL 21(H) 20 17  Creatinine 0.61 - 1.24 mg/dL 1.71(H) 1.47(H) 1.37(H)  Sodium 135 - 145 mmol/L 135 136 135  Potassium 3.5 - 5.1 mmol/L 4.6 4.8 4.2  Chloride 101 - 111 mmol/L 103 105 105  CO2 22 - 32 mmol/L 24 24 23   Calcium 8.9 - 10.3 mg/dL 9.2 9.0 8.9  Total Protein 6.5 - 8.1 g/dL 8.2(H) 8.3(H) 8.5(H)  Total Bilirubin 0.3 - 1.2 mg/dL 0.5 0.5 0.3  Alkaline Phos 38 - 126 U/L 72 117 60  AST 15 - 41 U/L 22 20 23   ALT 17 - 63 U/L 20 14(L)  16(L)   RADIOLOGY: I have personally reviewed the radiological images as listed and agreed with the findings in the report.  CT CAP w Contrast 01/22/2016 IMPRESSION: 1. Similar widespread osseous metastasis. 2. Similar soft tissue thickening within the retroperitoneum of the abdomen. Improved left pelvic side wall soft tissue thickening. These are consistent with sites of treated disease. No well-defined adenopathy. 3. No new sites of disease. 4.  Coronary artery atherosclerosis. Aortic atherosclerosis. 5. Grossly similar interstitial lung disease which could be idiopathic or related to chemotherapy. 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 6. 9 mm pancreatic cystic lesion is most likely a pseudocyst and is similar. Indolent neoplasm could look similar. Recommend attention on follow-up.  ASSESSMENT and THERAPY PLAN:   Stage IV prostate cancer, hormone refractory disease. HX taxotere HX Bilateral nephrostomy tubes Bilateral orchiectomy History of polysubstance abuse  Anemia, multifactorial  Tobacco Use   71 year old male with stage IV adenocarcinoma of the prostate, hormone refractory disease. Originally diagnosed in late 2015.   He had a bilateral orchiectomy. He continues on XGEVA monthly for bone metastases. He was again advised to take calcium and vitamin D daily.   PLAN: Tolerating jevtana well. PSA decreasing. Proceed with cycle 5 of Jevtana today.   Plan to get restaging CT C/A/P and bone scan after cycle 6 of Jevtana.  He will return for follow up in 3 weeks, CBC, CMP, PSA on his next visit.   All questions were answered. The patient knows to call the clinic with any problems, questions or concerns. We can certainly see the patient much sooner if necessary.  This document serves as a record of services personally performed by Twana First, MD. It was created on her behalf by Martinique Casey, a trained medical scribe. The creation of this record is based on the  scribe's personal observations and the provider's statements to them. This document has been checked and approved by the attending provider.  I have reviewed the above documentation for accuracy and completeness, and I agree with the above.  Martinique Sanda Klein  07/28/2016

## 2016-07-28 NOTE — Progress Notes (Signed)
..  Jeff [name] arrived today for [facility] neulasta on body injector. See MAR for administration details. Injector in place and engaged with green light indicator on flashing. Tolerated application with out problems.  Tolerated chemo well. Stable and ambulatory on discharge home to self.

## 2016-07-28 NOTE — Patient Instructions (Signed)
[facility] Discharge Instructions for Patients Receiving Chemotherapy   Beginning January 23rd 2017 lab work for the [facility] will be done in the  Main lab at [facility] on 1st floor. If you have a lab appointment with the [place] please come in thru the  Main Entrance and check in at the main information desk   Today you received the following chemotherapy agents jevtana.  Neulasta Onpro device on left arm will dispense medication between 215 pm and 315 pm tomorrow, you can take device off after 330 pm tomorrow Thursday April 5th.  To help prevent nausea and vomiting after your treatment, we encourage you to take your nausea medication as instructed.   If you develop nausea and vomiting, or diarrhea that is not controlled by your medication, call the clinic.  The clinic phone number is [phone]. Office hours are Monday-Friday 8:30am-5:00pm.  BELOW ARE SYMPTOMS THAT SHOULD BE REPORTED IMMEDIATELY:  *FEVER GREATER THAN 101.0 F  *CHILLS WITH OR WITHOUT FEVER  NAUSEA AND VOMITING THAT IS NOT CONTROLLED WITH YOUR NAUSEA MEDICATION  *UNUSUAL SHORTNESS OF BREATH  *UNUSUAL BRUISING OR BLEEDING  TENDERNESS IN MOUTH AND THROAT WITH OR WITHOUT PRESENCE OF ULCERS  *URINARY PROBLEMS  *BOWEL PROBLEMS  UNUSUAL RASH Items with * indicate a potential emergency and should be followed up as soon as possible. If you have an emergency after office hours please contact your primary care physician or go to the nearest emergency department.  Please call the clinic during office hours if you have any questions or concerns.   You may also contact the Patient Navigator at [phone] should you have any questions or need assistance in obtaining follow up care.      Resources For Cancer Patients and their Caregivers ? American Cancer Society: Can assist with transportation, wigs, general needs, runs Look Good Feel Better.        302-174-4385 ? Cancer  Care: Provides financial assistance, online support groups, medication/co-pay assistance.  1-800-813-HOPE (361)677-3538) ? [facility] Assists [place] cancer patients and their families through emotional , educational and financial support.  901 878 7673 ? [place] DSS Where to apply for food stamps, Medicaid and utility assistance. 437-243-1669 ? RCATS: Transportation to medical appointments. 812-611-0829 ? Social Security Administration: May apply for disability if have a Stage IV cancer. 956-354-8328 262-204-8254 ? [employer], Disability and Transit Services: Assists with nutrition, care and transit needs. [phone]

## 2016-07-28 NOTE — Patient Instructions (Signed)
[place] at [facility] Discharge Instructions  RECOMMENDATIONS MADE BY THE CONSULTANT AND ANY TEST RESULTS WILL BE SENT TO YOUR REFERRING PHYSICIAN.  You were seen today by Dr. Twana First Follow up on 4/23 See Amy up front for appointments    Thank you for choosing [place] at [facility] to provide your oncology and hematology care.  To afford each patient quality time with our provider, please arrive at least 15 minutes before your scheduled appointment time.    If you have a lab appointment with the [place] please come in thru the  Main Entrance and check in at the main information desk  You need to re-schedule your appointment should you arrive 10 or more minutes late.  We strive to give you quality time with our providers, and arriving late affects you and other patients whose appointments are after yours.  Also, if you no show three or more times for appointments you may be dismissed from the clinic at the providers discretion.     Again, thank you for choosing [facility].  Our hope is that these requests will decrease the amount of time that you wait before being seen by our physicians.       _____________________________________________________________  Should you have questions after your visit to [facility], please contact our office at [phone] between the hours of 8:30 a.m. and 4:30 p.m.  Voicemails left after 4:30 p.m. will not be returned until the following business day.  For prescription refill requests, have your pharmacy contact our office.       Resources For Cancer Patients and their Caregivers ? American Cancer Society: Can assist with transportation, wigs, general needs, runs Look Good Feel Better.        541-149-0155 ? Cancer Care: Provides financial assistance, online support groups, medication/co-pay assistance.  1-800-813-HOPE 928-863-5236) ? [place] Assists [place] cancer patients and their families through emotional , educational and financial support.  (413) 347-7527 ? [place] DSS Where to apply for food stamps, Medicaid and utility assistance. (973) 526-7001 ? RCATS: Transportation to medical appointments. (715) 388-8014 ? Social Security Administration: May apply for disability if have a Stage IV cancer. (223)378-2681 402-143-6882 ? [employer], Disability and Transit Services: Assists with nutrition, care and transit needs. [place] Support Programs: @10RELATIVEDAYS @ > Cancer Support Group  2nd Tuesday of the month 1pm-2pm, Journey Room  > Creative Journey  3rd Tuesday of the month 1130am-1pm, Journey Room  > Look Good Feel Better  1st Wednesday of the month 10am-12 noon, Journey Room (Call [place] to register [phone])

## 2016-08-09 ENCOUNTER — Encounter (HOSPITAL_COMMUNITY): Payer: Medicare Other | Attending: Oncology

## 2016-08-09 ENCOUNTER — Other Ambulatory Visit (HOSPITAL_COMMUNITY): Payer: Self-pay | Admitting: Oncology

## 2016-08-09 ENCOUNTER — Encounter (HOSPITAL_COMMUNITY): Payer: Medicare Other

## 2016-08-09 VITALS — BP 115/74 | HR 92 | Temp 97.9°F | Resp 18

## 2016-08-09 DIAGNOSIS — C7951 Secondary malignant neoplasm of bone: Secondary | ICD-10-CM | POA: Diagnosis not present

## 2016-08-09 DIAGNOSIS — C61 Malignant neoplasm of prostate: Secondary | ICD-10-CM

## 2016-08-09 DIAGNOSIS — M899 Disorder of bone, unspecified: Secondary | ICD-10-CM

## 2016-08-09 LAB — COMPREHENSIVE METABOLIC PANEL
ALBUMIN: 4.3 g/dL (ref 3.5–5.0)
ALK PHOS: 117 U/L (ref 38–126)
ALT: 24 U/L (ref 17–63)
ANION GAP: 12 (ref 5–15)
AST: 32 U/L (ref 15–41)
BUN: 22 mg/dL — ABNORMAL HIGH (ref 6–20)
CALCIUM: 9.7 mg/dL (ref 8.9–10.3)
CHLORIDE: 97 mmol/L — AB (ref 101–111)
CO2: 28 mmol/L (ref 22–32)
Creatinine, Ser: 1.84 mg/dL — ABNORMAL HIGH (ref 0.61–1.24)
GFR calc non Af Amer: 36 mL/min — ABNORMAL LOW (ref >60)
GFR, EST AFRICAN AMERICAN: 41 mL/min — AB (ref >60)
GLUCOSE: 108 mg/dL — AB (ref 65–99)
POTASSIUM: 4.5 mmol/L (ref 3.5–5.1)
SODIUM: 137 mmol/L (ref 135–145)
Total Bilirubin: 0.2 mg/dL — ABNORMAL LOW (ref 0.3–1.2)
Total Protein: 8.3 g/dL — ABNORMAL HIGH (ref 6.5–8.1)

## 2016-08-09 MED ORDER — DENOSUMAB 120 MG/1.7ML ~~LOC~~ SOLN
120.0000 mg | Freq: Once | SUBCUTANEOUS | Status: AC
Start: 2016-08-09 — End: 2016-08-09
  Administered 2016-08-09: 120 mg via SUBCUTANEOUS
  Filled 2016-08-09: qty 1.7

## 2016-08-09 MED ORDER — OXYCODONE HCL 5 MG PO TABS: ORAL_TABLET | ORAL | 0 refills | Status: DC

## 2016-08-09 NOTE — Progress Notes (Signed)
Jeff [name] presents today for injection per MD orders. Xgeva 120  Mg injection administered SQ in right Abdomen. Administration without incident. Patient tolerated well.

## 2016-08-09 NOTE — Patient Instructions (Signed)
[place] at [facility] Discharge Instructions  RECOMMENDATIONS MADE BY THE CONSULTANT AND ANY TEST RESULTS WILL BE SENT TO YOUR REFERRING PHYSICIAN.  Xgeva 120 mg injection given as ordered. Return as scheduled.  Thank you for choosing [place] at [facility] to provide your oncology and hematology care.  To afford each patient quality time with our provider, please arrive at least 15 minutes before your scheduled appointment time.    If you have a lab appointment with the [facility] please come in thru the  Main Entrance and check in at the main information desk  You need to re-schedule your appointment should you arrive 10 or more minutes late.  We strive to give you quality time with our providers, and arriving late affects you and other patients whose appointments are after yours.  Also, if you no show three or more times for appointments you may be dismissed from the clinic at the providers discretion.     Again, thank you for choosing [facility].  Our hope is that these requests will decrease the amount of time that you wait before being seen by our physicians.       _____________________________________________________________  Should you have questions after your visit to [facility], please contact our office at [phone] between the hours of 8:30 a.m. and 4:30 p.m.  Voicemails left after 4:30 p.m. will not be returned until the following business day.  For prescription refill requests, have your pharmacy contact our office.       Resources For Cancer Patients and their Caregivers ? American Cancer Society: Can assist with transportation, wigs, general needs, runs Look Good Feel Better.        616-641-3225 ? Cancer Care: Provides financial assistance, online support groups, medication/co-pay assistance.  1-800-813-HOPE 210-120-7151) ? [place] Assists [place] cancer  patients and their families through emotional , educational and financial support.  670-340-1392 ? [place] DSS Where to apply for food stamps, Medicaid and utility assistance. (289)356-9104 ? RCATS: Transportation to medical appointments. 607 369 8792 ? Social Security Administration: May apply for disability if have a Stage IV cancer. 438-040-8858 706-693-1749 ? [employer], Disability and Transit Services: Assists with nutrition, care and transit needs. [place] Support Programs: @10RELATIVEDAYS @ > Cancer Support Group  2nd Tuesday of the month 1pm-2pm, Journey Room  > Creative Journey  3rd Tuesday of the month 1130am-1pm, Journey Room  > Look Good Feel Better  1st Wednesday of the month 10am-12 noon, Journey Room (Call [place] to register [phone])

## 2016-08-18 ENCOUNTER — Encounter (HOSPITAL_BASED_OUTPATIENT_CLINIC_OR_DEPARTMENT_OTHER): Payer: Medicare Other

## 2016-08-18 ENCOUNTER — Encounter (HOSPITAL_COMMUNITY): Payer: Self-pay

## 2016-08-18 ENCOUNTER — Encounter (HOSPITAL_BASED_OUTPATIENT_CLINIC_OR_DEPARTMENT_OTHER): Payer: Medicare Other | Admitting: Oncology

## 2016-08-18 VITALS — BP 96/54 | HR 61 | Temp 97.9°F | Resp 18 | Wt 170.0 lb

## 2016-08-18 DIAGNOSIS — C61 Malignant neoplasm of prostate: Secondary | ICD-10-CM

## 2016-08-18 DIAGNOSIS — Z5189 Encounter for other specified aftercare: Secondary | ICD-10-CM | POA: Diagnosis not present

## 2016-08-18 DIAGNOSIS — D649 Anemia, unspecified: Secondary | ICD-10-CM

## 2016-08-18 DIAGNOSIS — Z5111 Encounter for antineoplastic chemotherapy: Secondary | ICD-10-CM | POA: Diagnosis not present

## 2016-08-18 DIAGNOSIS — C7951 Secondary malignant neoplasm of bone: Secondary | ICD-10-CM | POA: Diagnosis not present

## 2016-08-18 LAB — COMPREHENSIVE METABOLIC PANEL
ALT: 16 U/L — AB (ref 17–63)
AST: 24 U/L (ref 15–41)
Albumin: 3.9 g/dL (ref 3.5–5.0)
Alkaline Phosphatase: 63 U/L (ref 38–126)
Anion gap: 7 (ref 5–15)
BILIRUBIN TOTAL: 0.5 mg/dL (ref 0.3–1.2)
BUN: 18 mg/dL (ref 6–20)
CALCIUM: 9.1 mg/dL (ref 8.9–10.3)
CO2: 24 mmol/L (ref 22–32)
CREATININE: 1.92 mg/dL — AB (ref 0.61–1.24)
Chloride: 102 mmol/L (ref 101–111)
GFR calc Af Amer: 39 mL/min — ABNORMAL LOW (ref >60)
GFR, EST NON AFRICAN AMERICAN: 34 mL/min — AB (ref >60)
Glucose, Bld: 84 mg/dL (ref 65–99)
Potassium: 4.2 mmol/L (ref 3.5–5.1)
Sodium: 133 mmol/L — ABNORMAL LOW (ref 135–145)
Total Protein: 7.8 g/dL (ref 6.5–8.1)

## 2016-08-18 LAB — CBC WITH DIFFERENTIAL/PLATELET
BASOS ABS: 0 10*3/uL (ref 0.0–0.1)
Basophils Relative: 0 %
EOS PCT: 1 %
Eosinophils Absolute: 0.1 10*3/uL (ref 0.0–0.7)
HEMATOCRIT: 32.3 % — AB (ref 39.0–52.0)
Hemoglobin: 11 g/dL — ABNORMAL LOW (ref 13.0–17.0)
LYMPHS ABS: 1.5 10*3/uL (ref 0.7–4.0)
LYMPHS PCT: 27 %
MCH: 31.9 pg (ref 26.0–34.0)
MCHC: 34.1 g/dL (ref 30.0–36.0)
MCV: 93.6 fL (ref 78.0–100.0)
MONO ABS: 0.5 10*3/uL (ref 0.1–1.0)
Monocytes Relative: 10 %
NEUTROS ABS: 3.4 10*3/uL (ref 1.7–7.7)
Neutrophils Relative %: 62 %
Platelets: 287 10*3/uL (ref 150–400)
RBC: 3.45 MIL/uL — AB (ref 4.22–5.81)
RDW: 16.1 % — ABNORMAL HIGH (ref 11.5–15.5)
WBC: 5.5 10*3/uL (ref 4.0–10.5)

## 2016-08-18 MED ORDER — DEXTROSE 5 % IV SOLN
20.0000 mg/m2 | Freq: Once | INTRAVENOUS | Status: AC
  Administered 2016-08-18: 41 mg via INTRAVENOUS
  Filled 2016-08-18: qty 4.1

## 2016-08-18 MED ORDER — SODIUM CHLORIDE 0.9 % IV SOLN
Freq: Once | INTRAVENOUS | Status: AC
  Administered 2016-08-18: 11:00:00 via INTRAVENOUS

## 2016-08-18 MED ORDER — PEGFILGRASTIM 6 MG/0.6ML ~~LOC~~ PSKT
6.0000 mg | PREFILLED_SYRINGE | Freq: Once | SUBCUTANEOUS | Status: AC
  Administered 2016-08-18: 6 mg via SUBCUTANEOUS
  Filled 2016-08-18: qty 0.6

## 2016-08-18 MED ORDER — SODIUM CHLORIDE 0.9% FLUSH
10.0000 mL | INTRAVENOUS | Status: DC | PRN
  Administered 2016-08-18: 10 mL
  Filled 2016-08-18: qty 10

## 2016-08-18 MED ORDER — HEPARIN SOD (PORK) LOCK FLUSH 100 UNIT/ML IV SOLN
500.0000 [IU] | Freq: Once | INTRAVENOUS | Status: AC | PRN
  Administered 2016-08-18: 500 [IU]
  Filled 2016-08-18: qty 5

## 2016-08-18 MED ORDER — DIPHENHYDRAMINE HCL 50 MG/ML IJ SOLN
25.0000 mg | Freq: Once | INTRAMUSCULAR | Status: AC
  Administered 2016-08-18: 25 mg via INTRAVENOUS
  Filled 2016-08-18: qty 1

## 2016-08-18 MED ORDER — DEXAMETHASONE SODIUM PHOSPHATE 10 MG/ML IJ SOLN
10.0000 mg | Freq: Once | INTRAMUSCULAR | Status: AC
  Administered 2016-08-18: 10 mg via INTRAVENOUS
  Filled 2016-08-18: qty 1

## 2016-08-18 MED ORDER — FAMOTIDINE IN NACL 20-0.9 MG/50ML-% IV SOLN
20.0000 mg | Freq: Once | INTRAVENOUS | Status: AC
  Administered 2016-08-18: 20 mg via INTRAVENOUS
  Filled 2016-08-18: qty 50

## 2016-08-18 MED ORDER — SODIUM CHLORIDE 0.9 % IV SOLN: 10.0000 mg | Freq: Once | INTRAVENOUS | Status: DC

## 2016-08-18 NOTE — Patient Instructions (Signed)
[facility] Discharge Instructions for Patients Receiving Chemotherapy   Beginning January 23rd 2017 lab work for the Cancer Center will be done in the  Main lab at [facility] on 1st floor. If you have a lab appointment with the Cancer Center please come in thru the  Main Entrance and check in at the main information desk   Today you received the following chemotherapy agents   To help prevent nausea and vomiting after your treatment, we encourage you to take your nausea medication     If you develop nausea and vomiting, or diarrhea that is not controlled by your medication, call the clinic.  The clinic phone number is [phone]. Office hours are Monday-Friday 8:30am-5:00pm.  BELOW ARE SYMPTOMS THAT SHOULD BE REPORTED IMMEDIATELY:  *FEVER GREATER THAN 101.0 F  *CHILLS WITH OR WITHOUT FEVER  NAUSEA AND VOMITING THAT IS NOT CONTROLLED WITH YOUR NAUSEA MEDICATION  *UNUSUAL SHORTNESS OF BREATH  *UNUSUAL BRUISING OR BLEEDING  TENDERNESS IN MOUTH AND THROAT WITH OR WITHOUT PRESENCE OF ULCERS  *URINARY PROBLEMS  *BOWEL PROBLEMS  UNUSUAL RASH Items with * indicate a potential emergency and should be followed up as soon as possible. If you have an emergency after office hours please contact your primary care physician or go to the nearest emergency department.  Please call the clinic during office hours if you have any questions or concerns.   You may also contact the Patient Navigator at [phone] should you have any questions or need assistance in obtaining follow up care.      Resources For Cancer Patients and their Caregivers ? American Cancer Society: Can assist with transportation, wigs, general needs, runs Look Good Feel Better.        1-888-227-6333 ? Cancer Care: Provides financial assistance, online support groups, medication/co-pay assistance.  1-800-813-HOPE (4673) ? [facility] Assists [place] cancer  patients and their families through emotional , educational and financial support.  336-427-4357 ? [place] DSS Where to apply for food stamps, Medicaid and utility assistance. 336-342-1394 ? RCATS: Transportation to medical appointments. 336-347-2287 ? Social Security Administration: May apply for disability if have a Stage IV cancer. 336-342-7796 1-800-772-1213 ? [place] Aging, Disability and Transit Services: Assists with nutrition, care and transit needs. [phone]         

## 2016-08-18 NOTE — Progress Notes (Signed)
No PCP Per Patient No address on file  Stage IV Adenocarcinoma of the prostate, Hormone Sensitive Disease Bilateral orchiectomy 01/2014 Taxotere/Xgeva started 02/2014 Bilateral nephrostomy tube placement 01/2014 Nephrostomy tube removal 07/23/2014    Prostate cancer metastatic to multiple sites ([facility])   01/26/2014 Tumor Marker    PSA > 5000      01/28/2014 Initial Biopsy    Metastatic adenocarcinoma of prostate      02/05/2014 Surgery    Bilateral orchiectomy by Dr. Junious Silk      02/26/2014 Tumor Marker    PSA = 399.5      03/14/2014 Tumor Marker    PSA= 89.51      03/14/2014 - 06/26/2014 Chemotherapy    Docetaxel 75 mg/kg every 21 days x 6 cycles      04/24/2014 Tumor Marker    PSA- 19.89      07/23/2014 Procedure    Nephrostomy tube removed by IR, Dr. Geroge Baseman      07/24/2014 Tumor Marker    PSA= 6.15      10/16/2014 Tumor Marker    PSA: 3.54       01/08/2015 Tumor Marker    PSA: 2.13       01/17/2015 Imaging    CT CAP-  Massive pelvic and retroperitoneal lymphadenopathy noted on the prior study has nearly completely resolved, now with only a small amount of residual amorphous soft tissue predominantly around the the infrarenal abdominal aorta.      01/17/2015 Imaging    Bone scan- Widespread osseous metastatic disease with multiple foci of increased activity throughout the skeleton, corresponding with the findings on the prior CT from October, 2015.      04/11/2015 Tumor Marker    PSA: 1.87       07/21/2015 Imaging    Bone scan- Bony metastatic disease again noted at multiple sites, stable from prior study. No progression of bony metastatic disease is demonstrable on this study.      07/25/2015 Imaging    CT CAP- Stable matted soft tissue density in the retroperitoneum and extraperitoneal pelvis consistent with treated disease. No recurrent lymphadenopathy.  No pulmonary metastatic disease. Diffuse stable sclerotic metastatic bone disease.      01/14/2016 Imaging    Bone scan-  Multiple sites of abnormal increased tracer localization involving BILATERAL ribs, T11, T12, questionably RIGHT scapula and LEFT iliac bone suspicious for osseous metastases.      01/23/2016 Imaging    CT CAP- 1. Similar widespread osseous metastasis. 2. Similar soft tissue thickening within the retroperitoneum of the abdomen. Improved left pelvic side wall soft tissue thickening. These are consistent with sites of treated disease. No well-defined adenopathy. 3. No new sites of disease.      05/03/2016 -  Chemotherapy    Jevtana every 21 days        INTERVAL HISTORY: Jeff [name] 71 y.o. male returns for follow-up of his hormone refractory stage IV prostate cancer. At time of diagnosis he had stage IV disease, treated with orchiectomy and taxotere from 03/14/2014- 06/26/2014 with excellent response. Unfortunately PSA has begun to rise and has now been started on jevtana. Since he has been on Jevtana his PSA has been trending down. Jeff [name] has an excellent PS.  Patient presents for cycle 7 of Jevtana today. He is doing well today. Denies chest pain, SOB, leg swelling, abdominal pain, weight loss, tingling, bone pain, or any other concerns.   MEDICAL HISTORY: Past Medical History:  Diagnosis Date  .  Acute renal failure ([place]) 01/26/2014  . Alcohol abuse   . Anemia of chronic disease 01/26/2014  . Bilateral hydronephrosis 01/26/2014  . History of cocaine abuse 01/26/2014  . Homelessness 01/31/2014  . Prostate cancer metastatic to multiple sites ([place]) 01/30/2014  . Sepsis ([place])   . UTI (lower urinary tract infection)     has Bone lesion; Acute renal failure ([place]); Bilateral hydronephrosis; Anemia of chronic disease; History of cocaine abuse; Prostate cancer metastatic to multiple sites [facility]); Homelessness; Health care maintenance; Sepsis ([place]); UTI (lower urinary tract infection); Malnutrition of moderate degree ([place]); Bacteremia; Pyrexia; and B12  deficiency on his problem list.     has No Known Allergies.    SURGICAL HISTORY: Past Surgical History:  Procedure Laterality Date  . ORCHIECTOMY Bilateral 02/05/2014   Procedure: BILATERAL ORCHIECTOMY;  Surgeon: Festus Aloe, MD;  Location: [facility];  Service: Urology;  Laterality: Bilateral;  . PERCUTANEOUS NEPHROSTOMY Bilateral    IR Dr. Junious Silk changed on 04/22/2014  . PORTACATH PLACEMENT Right 03/12/14    SOCIAL HISTORY: Social History   Social History  . Marital status: Single    Spouse name: N/A  . Number of children: 1  . Years of education: 9th   Occupational History  . disabilty [employer]   Social History Main Topics  . Smoking status: Current Every Day Smoker    Packs/day: 0.25    Years: 55.00    Types: Cigarettes  . Smokeless tobacco: Never Used  . Alcohol use No     Comment: none for at least 7 -8 years  . Drug use: Yes    Types: Cocaine     Comment: last used Cocaine 01/25/14  . Sexual activity: Yes   Other Topics Concern  . Not on file   Social History Narrative   Lives at [employer] 9th grade   1 son, lives in [place], [place]   Can read and write in native language             FAMILY HISTORY: Family History  Problem Relation Age of Onset  . Cancer Brother 60   Review of Systems  Constitutional: Negative.  Negative for weight loss.  HENT: Negative.   Eyes: Negative.   Respiratory: Negative.  Negative for shortness of breath.   Cardiovascular: Negative.  Negative for chest pain and leg swelling.  Gastrointestinal: Negative.  Negative for abdominal pain.  Genitourinary: Negative.   Musculoskeletal: Negative.        No bone pain  Skin: Negative.   Neurological: Negative.  Negative for tingling.  Endo/Heme/Allergies: Negative.   Psychiatric/Behavioral: Negative.   All other systems reviewed and are negative. 14 point review of systems was performed and is negative except as detailed under history of  present illness and above  PHYSICAL EXAMINATION ECOG PERFORMANCE STATUS: 1 - Symptomatic but completely ambulatory  Physical Exam  Constitutional: He is oriented to person, place, and time and well-developed, well-nourished, and in no distress.  HENT:  Head: Normocephalic and atraumatic.  Mouth/Throat: No oropharyngeal exudate.  Poor dentition  Eyes: Conjunctivae and EOM are normal. Pupils are equal, round, and reactive to light. No scleral icterus.  Neck: Normal range of motion. Neck supple.  Cardiovascular: Normal rate, regular rhythm and normal heart sounds.   Pulmonary/Chest: Effort normal and breath sounds normal.  Abdominal: Soft. Bowel sounds are normal. He exhibits no distension and no mass. There is no tenderness. There is no rebound and no guarding.  Musculoskeletal: Normal range  of motion.  Lymphadenopathy:    He has no cervical adenopathy.  Neurological: He is alert and oriented to person, place, and time. No cranial nerve deficit. Gait normal.  Skin: Skin is warm and dry.  Psychiatric: Mood, memory, affect and judgment normal.  Nursing note and vitals reviewed.  LABORATORY DATA: I have reviewed the labs below. CBC Latest Ref Rng & Units 08/18/2016 07/26/2016 07/12/2016  WBC 4.0 - 10.5 K/uL 5.5 8.9 23.8(H)  Hemoglobin 13.0 - 17.0 g/dL 11.0(L) 12.3(L) 11.8(L)  Hematocrit 39.0 - 52.0 % 32.3(L) 36.9(L) 34.9(L)  Platelets 150 - 400 K/uL 287 214 242   CMP Latest Ref Rng & Units 08/18/2016 08/09/2016 07/26/2016  Glucose 65 - 99 mg/dL 84 108(H) 97  BUN 6 - 20 mg/dL 18 22(H) 21(H)  Creatinine 0.61 - 1.24 mg/dL 1.92(H) 1.84(H) 1.71(H)  Sodium 135 - 145 mmol/L 133(L) 137 135  Potassium 3.5 - 5.1 mmol/L 4.2 4.5 4.6  Chloride 101 - 111 mmol/L 102 97(L) 103  CO2 22 - 32 mmol/L 24 28 24   Calcium 8.9 - 10.3 mg/dL 9.1 9.7 9.2  Total Protein 6.5 - 8.1 g/dL 7.8 8.3(H) 8.2(H)  Total Bilirubin 0.3 - 1.2 mg/dL 0.5 0.2(L) 0.5  Alkaline Phos 38 - 126 U/L 63 117 72  AST 15 - 41 U/L 24 32 22    ALT 17 - 63 U/L 16(L) 24 20   RADIOLOGY: I have personally reviewed the radiological images as listed and agreed with the findings in the report.  CT CAP w Contrast 01/22/2016 IMPRESSION: 1. Similar widespread osseous metastasis. 2. Similar soft tissue thickening within the retroperitoneum of the abdomen. Improved left pelvic side wall soft tissue thickening. These are consistent with sites of treated disease. No well-defined adenopathy. 3. No new sites of disease. 4.  Coronary artery atherosclerosis. Aortic atherosclerosis. 5. Grossly similar interstitial lung disease which could be idiopathic or related to chemotherapy. 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 6. 9 mm pancreatic cystic lesion is most likely a pseudocyst and is similar. Indolent neoplasm could look similar. Recommend attention on follow-up.  ASSESSMENT and THERAPY PLAN:   Stage IV prostate cancer, hormone refractory disease. HX taxotere HX Bilateral nephrostomy tubes Bilateral orchiectomy History of polysubstance abuse  Anemia, multifactorial  Tobacco Use   71 year old male with stage IV adenocarcinoma of the prostate, hormone refractory disease. Originally diagnosed in late 2015.   He had a bilateral orchiectomy. He continues on XGEVA monthly for bone metastases. He was again advised to take calcium and vitamin D daily.   PLAN: Tolerating jevtana well. PSA decreasing. Proceed with cycle 7 of Jevtana today.   Ordered restaging bone scan and CT C/A/P to be performed prior to next visit.   Continue with treatment today; tolerating well.   He will return for follow up in 3 weeks to review scans and labs.   Orders Placed This Encounter  Procedures  . NM Bone Scan Whole Body    Standing Status:   Future    Standing Expiration Date:   08/18/2017    Order Specific Question:   Reason for Exam (SYMPTOM  OR DIAGNOSIS REQUIRED)    Answer:   metastatic prostate CA restaging scans    Order Specific  Question:   If indicated for the ordered procedure, I authorize the administration of a radiopharmaceutical per Radiology protocol    Answer:   Yes    Order Specific Question:   Preferred imaging location?    Answer:   [facility]  Order Specific Question:   Radiology Contrast Protocol - do NOT remove file path    Answer:   \\[email or web address]  . CT Abdomen Pelvis W Contrast    Standing Status:   Future    Standing Expiration Date:   08/18/2017    Order Specific Question:   If indicated for the ordered procedure, I authorize the administration of contrast media per Radiology protocol    Answer:   Yes    Order Specific Question:   Reason for Exam (SYMPTOM  OR DIAGNOSIS REQUIRED)    Answer:   metastatic prostate CA restaging scans    Order Specific Question:   Preferred imaging location?    Answer:   [facility]    Order Specific Question:   Radiology Contrast Protocol - do NOT remove file path    Answer:   \\[email or web address]\Radiant\CTProtocols.pdf  . CT Chest W Contrast    Standing Status:   Future    Standing Expiration Date:   08/18/2017    Order Specific Question:   If indicated for the ordered procedure, I authorize the administration of contrast media per Radiology protocol    Answer:   Yes    Order Specific Question:   Reason for Exam (SYMPTOM  OR DIAGNOSIS REQUIRED)    Answer:   metastatic prostate CA restaging scans    Order Specific Question:   Preferred imaging location?    Answer:   [facility]    Order Specific Question:   Radiology Contrast Protocol - do NOT remove file path    Answer:   \\[email or web address]\Radiant\CTProtocols.pdf  . PSA    Standing Status:   Future    Standing Expiration Date:   08/18/2017   All questions were answered. The patient knows to call the clinic with any problems, questions or concerns. We can certainly see the patient much sooner if necessary.  This document serves as a record of services  personally performed by Twana First, MD. It was created on her behalf by Martinique Casey, a trained medical scribe. The creation of this record is based on the scribe's personal observations and the provider's statements to them. This document has been checked and approved by the attending provider.  I have reviewed the above documentation for accuracy and completeness, and I agree with the above.  Martinique Sanda Klein  08/18/2016

## 2016-08-18 NOTE — Patient Instructions (Addendum)
[place] at [facility] Discharge Instructions  RECOMMENDATIONS MADE BY THE CONSULTANT AND ANY TEST RESULTS WILL BE SENT TO YOUR REFERRING PHYSICIAN.  You were seen today by Dr. Talbert Cage. RTC in 3 weeks for follow up. Restaging CT C/A/P and bone scan prior to next visit Labs on next visit  Thank you for choosing [place] at [facility] to provide your oncology and hematology care.  To afford each patient quality time with our provider, please arrive at least 15 minutes before your scheduled appointment time.    If you have a lab appointment with the [facility] please come in thru the  Main Entrance and check in at the main information desk  You need to re-schedule your appointment should you arrive 10 or more minutes late.  We strive to give you quality time with our providers, and arriving late affects you and other patients whose appointments are after yours.  Also, if you no show three or more times for appointments you may be dismissed from the clinic at the providers discretion.     Again, thank you for choosing [facility].  Our hope is that these requests will decrease the amount of time that you wait before being seen by our physicians.       _____________________________________________________________  Should you have questions after your visit to [facility], please contact our office at [phone] between the hours of 8:30 a.m. and 4:30 p.m.  Voicemails left after 4:30 p.m. will not be returned until the following business day.  For prescription refill requests, have your pharmacy contact our office.       Resources For Cancer Patients and their Caregivers ? American Cancer Society: Can assist with transportation, wigs, general needs, runs Look Good Feel Better.        520-218-9504 ? Cancer Care: Provides financial assistance, online support groups, medication/co-pay assistance.  1-800-813-HOPE  225-725-2899) ? [facility] Assists [place] cancer patients and their families through emotional , educational and financial support.  619-098-3329 ? [place] DSS Where to apply for food stamps, Medicaid and utility assistance. 9340295405 ? RCATS: Transportation to medical appointments. 587-123-8200 ? Social Security Administration: May apply for disability if have a Stage IV cancer. [phone] 951-240-5792 ? [employer], Disability and Transit Services: Assists with nutrition, care and transit needs. [place] Support Programs: @10RELATIVEDAYS @ > Cancer Support Group  2nd Tuesday of the month 1pm-2pm, Journey Room  > Creative Journey  3rd Tuesday of the month 1130am-1pm, Journey Room  > Look Good Feel Better  1st Wednesday of the month 10am-12 noon, Journey Room (Call [place] to register [phone])

## 2016-08-18 NOTE — Progress Notes (Signed)
Labs reviewed with Dr. Talbert Cage, proceed with treatment.  Chemotherapy given today per orders. Patient tolerated it well, no issues. Vitals stable. [name] KitchenAdriana [name] arrived today for Jeff [name] neulasta on body injector. See MAR for administration details. Injector in place and engaged with green light indicator on flashing. Tolerated application with out problems.  Discharged home from clinic ambulatory, follow up as scheduled.

## 2016-08-25 ENCOUNTER — Encounter (HOSPITAL_COMMUNITY): Payer: Self-pay

## 2016-08-25 ENCOUNTER — Encounter (HOSPITAL_COMMUNITY): Admission: RE | Admit: 2016-08-25 | Payer: Medicare Other | Source: Ambulatory Visit

## 2016-08-25 ENCOUNTER — Encounter (HOSPITAL_COMMUNITY)
Admission: RE | Admit: 2016-08-25 | Discharge: 2016-08-25 | Disposition: A | Discharge status: Home / Self Care | Payer: Medicare Other | Source: Ambulatory Visit | Attending: Oncology | Admitting: Oncology

## 2016-08-25 ENCOUNTER — Encounter (HOSPITAL_COMMUNITY): Payer: Medicare Other | Attending: Oncology

## 2016-08-25 VITALS — BP 102/71 | HR 80 | Temp 97.5°F | Resp 18

## 2016-08-25 DIAGNOSIS — C61 Malignant neoplasm of prostate: Secondary | ICD-10-CM

## 2016-08-25 DIAGNOSIS — M899 Disorder of bone, unspecified: Secondary | ICD-10-CM

## 2016-08-25 DIAGNOSIS — E538 Deficiency of other specified B group vitamins: Secondary | ICD-10-CM

## 2016-08-25 MED ORDER — CYANOCOBALAMIN 1000 MCG/ML IJ SOLN
1000.0000 ug | Freq: Once | INTRAMUSCULAR | Status: AC
  Administered 2016-08-25: 1000 ug via INTRAMUSCULAR

## 2016-08-25 MED ORDER — TECHNETIUM TC 99M MEDRONATE IV KIT: 20.0000 | PACK | Freq: Once | INTRAVENOUS | Status: DC | PRN

## 2016-08-25 MED ORDER — CYANOCOBALAMIN 1000 MCG/ML IJ SOLN
INTRAMUSCULAR | Status: AC
  Filled 2016-08-25: qty 1

## 2016-08-25 NOTE — Patient Instructions (Signed)
[facility] at [facility] Discharge Instructions  RECOMMENDATIONS MADE BY THE CONSULTANT AND ANY TEST RESULTS WILL BE SENT TO YOUR REFERRING PHYSICIAN.  Received Vit B12 injection today. Follow-up as scheduled. Call clinic for any questions or concerns  Thank you for choosing [facility] at [facility] to provide your oncology and hematology care.  To afford each patient quality time with our provider, please arrive at least 15 minutes before your scheduled appointment time.    If you have a lab appointment with the Cancer Center please come in thru the  Main Entrance and check in at the main information desk  You need to re-schedule your appointment should you arrive 10 or more minutes late.  We strive to give you quality time with our providers, and arriving late affects you and other patients whose appointments are after yours.  Also, if you no show three or more times for appointments you may be dismissed from the clinic at the providers discretion.     Again, thank you for choosing [facility].  Our hope is that these requests will decrease the amount of time that you wait before being seen by our physicians.       _____________________________________________________________  Should you have questions after your visit to [facility], please contact our office at [phone] between the hours of 8:30 a.m. and 4:30 p.m.  Voicemails left after 4:30 p.m. will not be returned until the following business day.  For prescription refill requests, have your pharmacy contact our office.       Resources For Cancer Patients and their Caregivers ? American Cancer Society: Can assist with transportation, wigs, general needs, runs Look Good Feel Better.        1-888-227-6333 ? Cancer Care: Provides financial assistance, online support groups, medication/co-pay assistance.  1-800-813-HOPE (4673) ? [facility] Assists [place] cancer patients and their families through emotional , educational and financial support.  336-427-4357 ? [place] DSS Where to apply for food stamps, Medicaid and utility assistance. 336-342-1394 ? RCATS: Transportation to medical appointments. 336-347-2287 ? Social Security Administration: May apply for disability if have a Stage IV cancer. 336-342-7796 1-800-772-1213 ? [place] Aging, Disability and Transit Services: Assists with nutrition, care and transit needs. 336-349-2343  Cancer Center Support Programs: @10RELATIVEDAYS@ > Cancer Support Group  2nd Tuesday of the month 1pm-2pm, Journey Room  > Creative Journey  3rd Tuesday of the month 1130am-1pm, Journey Room  > Look Good Feel Better  1st Wednesday of the month 10am-12 noon, Journey Room (Call [employer] to register 1-800-395-5775)   

## 2016-08-25 NOTE — Progress Notes (Signed)
Jeff [name] tolerated Vit B12 injection well without complaints or incident. VSS Pt discharged self ambulatory in satisfactory condition

## 2016-09-01 ENCOUNTER — Ambulatory Visit (HOSPITAL_COMMUNITY)
Admission: RE | Admit: 2016-09-01 | Discharge: 2016-09-01 | Disposition: A | Discharge status: Home / Self Care | Payer: Medicare Other | Source: Ambulatory Visit | Attending: Oncology | Admitting: Oncology

## 2016-09-01 ENCOUNTER — Encounter (HOSPITAL_COMMUNITY): Payer: Medicare Other

## 2016-09-01 ENCOUNTER — Ambulatory Visit (HOSPITAL_COMMUNITY): Payer: Medicare Other

## 2016-09-01 DIAGNOSIS — C61 Malignant neoplasm of prostate: Secondary | ICD-10-CM

## 2016-09-06 ENCOUNTER — Other Ambulatory Visit (HOSPITAL_COMMUNITY): Payer: Medicare Other

## 2016-09-06 ENCOUNTER — Encounter (HOSPITAL_COMMUNITY): Payer: Self-pay

## 2016-09-06 ENCOUNTER — Encounter (HOSPITAL_BASED_OUTPATIENT_CLINIC_OR_DEPARTMENT_OTHER): Payer: Medicare Other

## 2016-09-06 ENCOUNTER — Ambulatory Visit (HOSPITAL_COMMUNITY)
Admission: RE | Admit: 2016-09-06 | Discharge: 2016-09-06 | Disposition: A | Discharge status: Home / Self Care | Payer: Medicare Other | Source: Ambulatory Visit | Attending: Oncology | Admitting: Oncology

## 2016-09-06 ENCOUNTER — Ambulatory Visit (HOSPITAL_COMMUNITY): Payer: Medicare Other

## 2016-09-06 ENCOUNTER — Encounter (HOSPITAL_COMMUNITY)
Admission: RE | Admit: 2016-09-06 | Discharge: 2016-09-06 | Disposition: A | Discharge status: Home / Self Care | Payer: Medicare Other | Source: Ambulatory Visit | Attending: Oncology | Admitting: Oncology

## 2016-09-06 ENCOUNTER — Encounter (HOSPITAL_COMMUNITY): Payer: Medicare Other

## 2016-09-06 VITALS — BP 131/88 | HR 80 | Temp 97.5°F | Resp 18

## 2016-09-06 DIAGNOSIS — I700 Atherosclerosis of aorta: Secondary | ICD-10-CM | POA: Insufficient documentation

## 2016-09-06 DIAGNOSIS — M899 Disorder of bone, unspecified: Secondary | ICD-10-CM | POA: Diagnosis not present

## 2016-09-06 DIAGNOSIS — C61 Malignant neoplasm of prostate: Secondary | ICD-10-CM

## 2016-09-06 DIAGNOSIS — J849 Interstitial pulmonary disease, unspecified: Secondary | ICD-10-CM | POA: Diagnosis not present

## 2016-09-06 DIAGNOSIS — C7951 Secondary malignant neoplasm of bone: Secondary | ICD-10-CM | POA: Diagnosis not present

## 2016-09-06 DIAGNOSIS — I2510 Atherosclerotic heart disease of native coronary artery without angina pectoris: Secondary | ICD-10-CM | POA: Insufficient documentation

## 2016-09-06 DIAGNOSIS — K862 Cyst of pancreas: Secondary | ICD-10-CM | POA: Insufficient documentation

## 2016-09-06 LAB — POCT I-STAT CREATININE: CREATININE: 1.8 mg/dL — AB (ref 0.61–1.24)

## 2016-09-06 LAB — CBC WITH DIFFERENTIAL/PLATELET
BASOS ABS: 0 10*3/uL (ref 0.0–0.1)
Basophils Relative: 0 %
EOS PCT: 1 %
Eosinophils Absolute: 0 10*3/uL (ref 0.0–0.7)
HCT: 34.4 % — ABNORMAL LOW (ref 39.0–52.0)
Hemoglobin: 11.4 g/dL — ABNORMAL LOW (ref 13.0–17.0)
LYMPHS ABS: 1.6 10*3/uL (ref 0.7–4.0)
LYMPHS PCT: 47 %
MCH: 30.9 pg (ref 26.0–34.0)
MCHC: 33.1 g/dL (ref 30.0–36.0)
MCV: 93.2 fL (ref 78.0–100.0)
MONO ABS: 0.4 10*3/uL (ref 0.1–1.0)
Monocytes Relative: 12 %
Neutro Abs: 1.3 10*3/uL — ABNORMAL LOW (ref 1.7–7.7)
Neutrophils Relative %: 40 %
PLATELETS: 309 10*3/uL (ref 150–400)
RBC: 3.69 MIL/uL — ABNORMAL LOW (ref 4.22–5.81)
RDW: 16 % — AB (ref 11.5–15.5)
WBC: 3.4 10*3/uL — ABNORMAL LOW (ref 4.0–10.5)

## 2016-09-06 LAB — COMPREHENSIVE METABOLIC PANEL
ALBUMIN: 3.8 g/dL (ref 3.5–5.0)
ALT: 24 U/L (ref 17–63)
AST: 27 U/L (ref 15–41)
Alkaline Phosphatase: 51 U/L (ref 38–126)
Anion gap: 7 (ref 5–15)
BILIRUBIN TOTAL: 0.3 mg/dL (ref 0.3–1.2)
BUN: 15 mg/dL (ref 6–20)
CO2: 26 mmol/L (ref 22–32)
CREATININE: 1.56 mg/dL — AB (ref 0.61–1.24)
Calcium: 9 mg/dL (ref 8.9–10.3)
Chloride: 101 mmol/L (ref 101–111)
GFR calc Af Amer: 51 mL/min — ABNORMAL LOW (ref >60)
GFR calc non Af Amer: 44 mL/min — ABNORMAL LOW (ref >60)
GLUCOSE: 92 mg/dL (ref 65–99)
POTASSIUM: 4.4 mmol/L (ref 3.5–5.1)
Sodium: 134 mmol/L — ABNORMAL LOW (ref 135–145)
TOTAL PROTEIN: 8.1 g/dL (ref 6.5–8.1)

## 2016-09-06 LAB — PSA: PSA: 1.18 ng/mL (ref 0.00–4.00)

## 2016-09-06 MED ORDER — TECHNETIUM TC 99M MEDRONATE IV KIT
20.0000 | PACK | Freq: Once | INTRAVENOUS | Status: AC | PRN
  Administered 2016-09-06: 20 via INTRAVENOUS

## 2016-09-06 MED ORDER — DENOSUMAB 120 MG/1.7ML ~~LOC~~ SOLN
120.0000 mg | Freq: Once | SUBCUTANEOUS | Status: AC
  Administered 2016-09-06: 120 mg via SUBCUTANEOUS
  Filled 2016-09-06: qty 1.7

## 2016-09-06 MED ORDER — IOPAMIDOL (ISOVUE-300) INJECTION 61%
75.0000 mL | Freq: Once | INTRAVENOUS | Status: AC | PRN
  Administered 2016-09-06: 75 mL via INTRAVENOUS

## 2016-09-06 MED ORDER — IOPAMIDOL (ISOVUE-300) INJECTION 61%
INTRAVENOUS | Status: AC
  Filled 2016-09-06: qty 30

## 2016-09-06 NOTE — Progress Notes (Signed)
Jeff [name] presents today for injection per the provider's orders.  Xgeva administration without incident; see MAR for injection details.  Patient tolerated procedure well and without incident.  No questions or complaints noted at this time.

## 2016-09-06 NOTE — Patient Instructions (Signed)
[facility] at [facility] Discharge Instructions  RECOMMENDATIONS MADE BY THE CONSULTANT AND ANY TEST RESULTS WILL BE SENT TO YOUR REFERRING PHYSICIAN.  Xgeva injection today. Return as scheduled.   Thank you for choosing [facility] at [facility] to provide your oncology and hematology care.  To afford each patient quality time with our provider, please arrive at least 15 minutes before your scheduled appointment time.    If you have a lab appointment with the Cancer Center please come in thru the  Main Entrance and check in at the main information desk  You need to re-schedule your appointment should you arrive 10 or more minutes late.  We strive to give you quality time with our providers, and arriving late affects you and other patients whose appointments are after yours.  Also, if you no show three or more times for appointments you may be dismissed from the clinic at the providers discretion.     Again, thank you for choosing [facility].  Our hope is that these requests will decrease the amount of time that you wait before being seen by our physicians.       _____________________________________________________________  Should you have questions after your visit to [facility], please contact our office at [phone] between the hours of 8:30 a.m. and 4:30 p.m.  Voicemails left after 4:30 p.m. will not be returned until the following business day.  For prescription refill requests, have your pharmacy contact our office.       Resources For Cancer Patients and their Caregivers ? American Cancer Society: Can assist with transportation, wigs, general needs, runs Look Good Feel Better.        1-888-227-6333 ? Cancer Care: Provides financial assistance, online support groups, medication/co-pay assistance.  1-800-813-HOPE (4673) ? [facility] Assists [place] cancer patients and  their families through emotional , educational and financial support.  336-427-4357 ? [place] DSS Where to apply for food stamps, Medicaid and utility assistance. 336-342-1394 ? [employer]: Transportation to medical appointments. 336-347-2287 ? Social Security Administration: May apply for disability if have a Stage IV cancer. 336-342-7796 1-800-772-1213 ? [place] [facility]ging, Disability and Transit Services: Assists with nutrition, care and transit needs. 336-349-2343  Cancer Center Support Programs: @10RELATIVEDAYS@ > Cancer Support Group  2nd Tuesday of the month 1pm-2pm, Journey Room  > Creative Journey  3rd Tuesday of the month 1130am-1pm, Journey Room  > Look Good Feel Better  1st Wednesday of the month 10am-12 noon, Journey Room (Call [employer] to register 1-800-395-5775)   

## 2016-09-08 ENCOUNTER — Encounter (HOSPITAL_BASED_OUTPATIENT_CLINIC_OR_DEPARTMENT_OTHER): Payer: Medicare Other | Admitting: Oncology

## 2016-09-08 ENCOUNTER — Encounter (HOSPITAL_BASED_OUTPATIENT_CLINIC_OR_DEPARTMENT_OTHER): Payer: Medicare Other

## 2016-09-08 ENCOUNTER — Encounter (HOSPITAL_COMMUNITY): Payer: Self-pay | Admitting: Oncology

## 2016-09-08 VITALS — BP 114/77 | HR 69 | Temp 98.2°F | Resp 18 | Wt 173.2 lb

## 2016-09-08 DIAGNOSIS — C7951 Secondary malignant neoplasm of bone: Secondary | ICD-10-CM

## 2016-09-08 DIAGNOSIS — D649 Anemia, unspecified: Secondary | ICD-10-CM

## 2016-09-08 DIAGNOSIS — Z5189 Encounter for other specified aftercare: Secondary | ICD-10-CM

## 2016-09-08 DIAGNOSIS — Z5111 Encounter for antineoplastic chemotherapy: Secondary | ICD-10-CM

## 2016-09-08 DIAGNOSIS — C61 Malignant neoplasm of prostate: Secondary | ICD-10-CM | POA: Diagnosis not present

## 2016-09-08 MED ORDER — DIPHENHYDRAMINE HCL 50 MG/ML IJ SOLN
INTRAMUSCULAR | Status: AC
  Filled 2016-09-08: qty 1

## 2016-09-08 MED ORDER — DIPHENHYDRAMINE HCL 50 MG/ML IJ SOLN
25.0000 mg | Freq: Once | INTRAMUSCULAR | Status: AC
  Administered 2016-09-08: 25 mg via INTRAVENOUS

## 2016-09-08 MED ORDER — SODIUM CHLORIDE 0.9 % IV SOLN
Freq: Once | INTRAVENOUS | Status: AC
  Administered 2016-09-08: 10:00:00 via INTRAVENOUS

## 2016-09-08 MED ORDER — FAMOTIDINE IN NACL 20-0.9 MG/50ML-% IV SOLN
INTRAVENOUS | Status: AC
  Filled 2016-09-08: qty 50

## 2016-09-08 MED ORDER — PEGFILGRASTIM 6 MG/0.6ML ~~LOC~~ PSKT
6.0000 mg | PREFILLED_SYRINGE | Freq: Once | SUBCUTANEOUS | Status: AC
  Administered 2016-09-08: 6 mg via SUBCUTANEOUS

## 2016-09-08 MED ORDER — OXYCODONE HCL 5 MG PO TABS: ORAL_TABLET | ORAL | 0 refills | Status: DC

## 2016-09-08 MED ORDER — SODIUM CHLORIDE 0.9% FLUSH
10.0000 mL | INTRAVENOUS | Status: DC | PRN
  Administered 2016-09-08: 10 mL
  Filled 2016-09-08: qty 10

## 2016-09-08 MED ORDER — DEXTROSE 5 % IV SOLN
20.0000 mg/m2 | Freq: Once | INTRAVENOUS | Status: AC
  Administered 2016-09-08: 41 mg via INTRAVENOUS
  Filled 2016-09-08: qty 4.1

## 2016-09-08 MED ORDER — HEPARIN SOD (PORK) LOCK FLUSH 100 UNIT/ML IV SOLN
INTRAVENOUS | Status: AC
  Filled 2016-09-08: qty 5

## 2016-09-08 MED ORDER — FAMOTIDINE IN NACL 20-0.9 MG/50ML-% IV SOLN
20.0000 mg | Freq: Once | INTRAVENOUS | Status: AC
  Administered 2016-09-08: 20 mg via INTRAVENOUS

## 2016-09-08 MED ORDER — PEGFILGRASTIM 6 MG/0.6ML ~~LOC~~ PSKT
PREFILLED_SYRINGE | SUBCUTANEOUS | Status: AC
  Filled 2016-09-08: qty 0.6

## 2016-09-08 MED ORDER — HEPARIN SOD (PORK) LOCK FLUSH 100 UNIT/ML IV SOLN
500.0000 [IU] | Freq: Once | INTRAVENOUS | Status: AC | PRN
  Administered 2016-09-08: 500 [IU]

## 2016-09-08 MED ORDER — DEXAMETHASONE SODIUM PHOSPHATE 10 MG/ML IJ SOLN
10.0000 mg | Freq: Once | INTRAMUSCULAR | Status: AC
  Administered 2016-09-08: 10 mg via INTRAVENOUS
  Filled 2016-09-08: qty 1

## 2016-09-08 MED ORDER — SODIUM CHLORIDE 0.9 % IV SOLN: 10.0000 mg | Freq: Once | INTRAVENOUS | Status: DC

## 2016-09-08 NOTE — Patient Instructions (Signed)
[facility] Discharge Instructions for Patients Receiving Chemotherapy   Beginning January 23rd 2017 lab work for the Cancer Center will be done in the  Main lab at [facility] on 1st floor. If you have a lab appointment with the Cancer Center please come in thru the  Main Entrance and check in at the main information desk   Today you received the following chemotherapy agents   To help prevent nausea and vomiting after your treatment, we encourage you to take your nausea medication     If you develop nausea and vomiting, or diarrhea that is not controlled by your medication, call the clinic.  The clinic phone number is [phone]. Office hours are Monday-Friday 8:30am-5:00pm.  BELOW ARE SYMPTOMS THAT SHOULD BE REPORTED IMMEDIATELY:  *FEVER GREATER THAN 101.0 F  *CHILLS WITH OR WITHOUT FEVER  NAUSEA AND VOMITING THAT IS NOT CONTROLLED WITH YOUR NAUSEA MEDICATION  *UNUSUAL SHORTNESS OF BREATH  *UNUSUAL BRUISING OR BLEEDING  TENDERNESS IN MOUTH AND THROAT WITH OR WITHOUT PRESENCE OF ULCERS  *URINARY PROBLEMS  *BOWEL PROBLEMS  UNUSUAL RASH Items with * indicate a potential emergency and should be followed up as soon as possible. If you have an emergency after office hours please contact your primary care physician or go to the nearest emergency department.  Please call the clinic during office hours if you have any questions or concerns.   You may also contact the Patient Navigator at [phone] should you have any questions or need assistance in obtaining follow up care.      Resources For Cancer Patients and their Caregivers ? American Cancer Society: Can assist with transportation, wigs, general needs, runs Look Good Feel Better.        1-888-227-6333 ? Cancer Care: Provides financial assistance, online support groups, medication/co-pay assistance.  1-800-813-HOPE (4673) ? [facility] Assists [place] cancer  patients and their families through emotional , educational and financial support.  336-427-4357 ? [place] DSS Where to apply for food stamps, Medicaid and utility assistance. 336-342-1394 ? [employer]: Transportation to medical appointments. 336-347-2287 ? Social Security Administration: May apply for disability if have a Stage IV cancer. 336-342-7796 1-800-772-1213 ? [place] Aging, Disability and Transit Services: Assists with nutrition, care and transit needs. 336-349-2343         

## 2016-09-08 NOTE — Progress Notes (Signed)
Labs reviewed with MD, proceed with treatment.  Chemotherapy given today per orders. Patient tolerated it well without problems. [name] KitchenAdriana [name] arrived today for [facility] neulasta on body injector. See MAR for administration details. Injector in place and engaged with green light indicator on flashing. Tolerated application with out problems.  Vitals stable and discharged home from clinic ambulatory.

## 2016-09-08 NOTE — Patient Instructions (Addendum)
[place] at [facility] Discharge Instructions  RECOMMENDATIONS MADE BY THE CONSULTANT AND ANY TEST RESULTS WILL BE SENT TO YOUR REFERRING PHYSICIAN.  You were seen today by Dr. Twana First Follow up in 3 weeks with chemotherapy treatment   Thank you for choosing [place] at [facility] to provide your oncology and hematology care.  To afford each patient quality time with our provider, please arrive at least 15 minutes before your scheduled appointment time.    If you have a lab appointment with the [facility] please come in thru the  Main Entrance and check in at the main information desk  You need to re-schedule your appointment should you arrive 10 or more minutes late.  We strive to give you quality time with our providers, and arriving late affects you and other patients whose appointments are after yours.  Also, if you no show three or more times for appointments you may be dismissed from the clinic at the providers discretion.     Again, thank you for choosing [facility].  Our hope is that these requests will decrease the amount of time that you wait before being seen by our physicians.       _____________________________________________________________  Should you have questions after your visit to [facility], please contact our office at [phone] between the hours of 8:30 a.m. and 4:30 p.m.  Voicemails left after 4:30 p.m. will not be returned until the following business day.  For prescription refill requests, have your pharmacy contact our office.       Resources For Cancer Patients and their Caregivers ? American Cancer Society: Can assist with transportation, wigs, general needs, runs Look Good Feel Better.        812-585-3559 ? Cancer Care: Provides financial assistance, online support groups, medication/co-pay assistance.  1-800-813-HOPE 661-749-0189) ? [facility] Assists [place] cancer patients and their families through emotional , educational and financial support.  270-299-3990 ? [place] DSS Where to apply for food stamps, Medicaid and utility assistance. 727-697-8573 ? RCATS: Transportation to medical appointments. 816 682 0175 ? Social Security Administration: May apply for disability if have a Stage IV cancer. 4092808418 604-038-1557 ? [employer], Disability and Transit Services: Assists with nutrition, care and transit needs. [place] Support Programs: @10RELATIVEDAYS @ > Cancer Support Group  2nd Tuesday of the month 1pm-2pm, Journey Room  > Creative Journey  3rd Tuesday of the month 1130am-1pm, Journey Room  > Look Good Feel Better  1st Wednesday of the month 10am-12 noon, Journey Room (Call [facility] to register [phone])

## 2016-09-08 NOTE — Progress Notes (Signed)
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 Junious Silk      02/26/2014 Tumor Marker    PSA = 399.5      03/14/2014 Tumor Marker    PSA= 89.51      03/14/2014 - 06/26/2014 Chemotherapy    Docetaxel 75 mg/kg every 21 days x 6 cycles      04/24/2014 Tumor Marker    PSA- 19.89      07/23/2014 Procedure    Nephrostomy tube removed by IR, Dr. Geroge Baseman      07/24/2014 Tumor Marker    PSA= 6.15      10/16/2014 Tumor Marker    PSA: 3.54       01/08/2015 Tumor Marker    PSA: 2.13       01/17/2015 Imaging    CT CAP-  Massive pelvic and retroperitoneal lymphadenopathy noted on the prior study has nearly completely resolved, now with only a small amount of residual amorphous soft tissue predominantly around the the infrarenal abdominal aorta.      01/17/2015 Imaging    Bone scan- Widespread osseous metastatic disease with multiple foci of increased activity throughout the skeleton, corresponding with the findings on the prior CT from October, 2015.      04/11/2015 Tumor Marker    PSA: 1.87       07/21/2015 Imaging    Bone scan- Bony metastatic disease again noted at multiple sites, stable from prior study. No progression of bony metastatic disease is demonstrable on this study.      07/25/2015 Imaging    CT CAP- Stable matted soft tissue density in the retroperitoneum and extraperitoneal pelvis consistent with treated  disease. No recurrent lymphadenopathy.  No pulmonary metastatic disease. Diffuse stable sclerotic metastatic bone disease.      01/14/2016 Imaging    Bone scan-  Multiple sites of abnormal increased tracer localization involving BILATERAL ribs, T11, T12, questionably RIGHT scapula and LEFT iliac bone suspicious for osseous metastases.      01/23/2016 Imaging    CT CAP- 1. Similar widespread osseous metastasis. 2. Similar soft tissue thickening within the retroperitoneum of the abdomen. Improved left pelvic side wall soft tissue thickening. These are consistent with sites of treated disease. No well-defined adenopathy. 3. No new sites of disease.      05/03/2016 -  Chemotherapy    Jevtana every 21 days       09/06/2016 Imaging    Restaging CT C/A/P: IMPRESSION: 1. Similar appearance of widespread sclerotic bone metastases. 2. No change in soft tissue thickening within the retroperitoneum and left pelvic sidewall. No well defined adenopathy identified. 3. No new sites of disease 4. Aortic Atherosclerosis (ICD10-I70.0). Coronary artery calcifications noted. 5. 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 6. Stable 9 mm pancreatic cystic lesion. Favor pseudocyst. Indolent neoplasm may look similar. Attention on follow-up imaging.      09/06/2016 Imaging  Bone Scan: IMPRESSION: In this patient with known diffuse sclerotic metastatic disease, bone scan does not reveal progression of radiotracer uptake. Several of the areas of previously demonstrated radiotracer uptake appear less prominent.  Change in appearance of radiotracer uptake involving the mandible now more notable on the right and previously more notable on the left may reflect result of dental disease rather than metastatic disease.        CURRENT THERAPY: Jevtana q21 days  INTERVAL HISTORY: Jeff [name] 71 y.o. male returns for continued follow-up  today. He is doing well. 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 He has been tolerated Jevtana well without any major side effects. He continues to have a good appetite. Denies any chest pain, shortness of breath, bone pain, focal weakness.   Patient Active Problem List   Diagnosis Date Noted  . B12 deficiency 04/01/2016  . Malnutrition of moderate degree ([place]) 05/24/2014  . Bacteremia 05/24/2014  . Pyrexia   . Sepsis ([place]) 05/23/2014  . UTI (lower urinary tract infection) 05/23/2014  . Health care maintenance 02/06/2014  . Homelessness 01/31/2014  . Prostate cancer metastatic to multiple sites ([place]) 01/30/2014  . Bone lesion 01/26/2014  . Acute renal failure ([place]) 01/26/2014  . Bilateral hydronephrosis 01/26/2014  . Anemia of chronic disease 01/26/2014  . History of cocaine abuse 01/26/2014    has No Known Allergies.  MEDICAL HISTORY: Past Medical History:  Diagnosis Date  . Acute renal failure ([place]) 01/26/2014  . Alcohol abuse   . Anemia of chronic disease 01/26/2014  . Bilateral hydronephrosis 01/26/2014  . History of cocaine abuse 01/26/2014  . Homelessness 01/31/2014  . Prostate cancer metastatic to multiple sites ([place]) 01/30/2014  . Sepsis ([place])   . UTI (lower urinary tract infection)     SURGICAL HISTORY: Past Surgical History:  Procedure Laterality Date  . ORCHIECTOMY Bilateral 02/05/2014   Procedure: BILATERAL ORCHIECTOMY;  Surgeon: Festus Aloe, MD;  Location: [facility];  Service: Urology;  Laterality: Bilateral;  . PERCUTANEOUS NEPHROSTOMY Bilateral    IR Dr. Junious Silk changed on 04/22/2014  . PORTACATH PLACEMENT Right 03/12/14    SOCIAL HISTORY: Social History   Social History  . Marital status: Single    Spouse name: N/A  . Number of children: 1  . Years of education: 9th   Occupational History  . disabilty [employer]   Social History  Main Topics  . Smoking status: Current Every Day Smoker    Packs/day: 0.25    Years: 55.00    Types: Cigarettes  . Smokeless tobacco: Never Used  . Alcohol use No     Comment: none for at least 7 -8 years  . Drug use: Yes    Types: Cocaine     Comment: last used Cocaine 01/25/14  . Sexual activity: Yes   Other Topics Concern  . Not on file   Social History Narrative   Lives at [employer] 9th grade   1 son, lives in [place], [place]   Can read and write in native language             FAMILY HISTORY: Family History  Problem Relation Age of Onset  . Cancer Brother 60    Review of Systems  Constitutional: Negative for appetite change, chills, fatigue and fever.  HENT:   Negative for hearing loss, lump/mass, mouth sores, sore throat  and tinnitus.   Eyes: Negative for eye problems and icterus.  Respiratory: Negative for chest tightness, cough, hemoptysis, shortness of breath and wheezing.   Cardiovascular: Negative for chest pain, leg swelling and palpitations.  Gastrointestinal: Negative for abdominal distention, abdominal pain, blood in stool, diarrhea, nausea and vomiting.  Endocrine: Negative.  Negative for hot flashes.  Genitourinary: Negative for difficulty urinating, frequency and hematuria.   Musculoskeletal: Negative for arthralgias and neck pain.  Skin: Negative for itching and rash.  Neurological: Negative for dizziness, headaches and speech difficulty.  Hematological: Negative for adenopathy. Does not bruise/bleed easily.  Psychiatric/Behavioral: Negative for confusion. The patient is not nervous/anxious.   Otherwise as per HPI.   PHYSICAL EXAMINATION  ECOG PERFORMANCE STATUS: 0 - Asymptomatic   Physical Exam  Constitutional: He is oriented to person, place, and time and well-developed, well-nourished, and in no distress. No distress.  HENT:  Head: Normocephalic and atraumatic.  Mouth/Throat: No oropharyngeal exudate.  Eyes: Conjunctivae  are normal. Pupils are equal, round, and reactive to light. No scleral icterus.  Neck: Normal range of motion. Neck supple. No JVD present.  Cardiovascular: Normal rate, regular rhythm and normal heart sounds.  Exam reveals no gallop and no friction rub.   No murmur heard. Pulmonary/Chest: Breath sounds normal. No respiratory distress. He has no wheezes. He has no rales.  Abdominal: Soft. Bowel sounds are normal. He exhibits no distension. There is no tenderness. There is no guarding.  Musculoskeletal: He exhibits no edema or tenderness.  Lymphadenopathy:    He has no cervical adenopathy.  Neurological: He is alert and oriented to person, place, and time. No cranial nerve deficit.  Skin: Skin is warm and dry. No rash noted. No erythema. No pallor.  Psychiatric: Affect and judgment normal.    LABORATORY DATA:  Ref Range & Units 2d ago (09/06/16) 77mo ago (07/12/16) 48mo ago (07/07/16) 64mo ago (06/28/16)    PSA 0.00 - 4.00 ng/mL 1.18  2.61CM  2.86CM  2.70CM     CBC    Component Value Date/Time   WBC 3.4 (L) 09/06/2016 1336   RBC 3.69 (L) 09/06/2016 1336   HGB 11.4 (L) 09/06/2016 1336   HCT 34.4 (L) 09/06/2016 1336   PLT 309 09/06/2016 1336   MCV 93.2 09/06/2016 1336   MCH 30.9 09/06/2016 1336   MCHC 33.1 09/06/2016 1336   RDW 16.0 (H) 09/06/2016 1336   LYMPHSABS 1.6 09/06/2016 1336   MONOABS 0.4 09/06/2016 1336   EOSABS 0.0 09/06/2016 1336   BASOSABS 0.0 09/06/2016 1336    CMP     Component Value Date/Time   NA 134 (L) 09/06/2016 1336   K 4.4 09/06/2016 1336   CL 101 09/06/2016 1336   CO2 26 09/06/2016 1336   GLUCOSE 92 09/06/2016 1336   BUN 15 09/06/2016 1336   CREATININE 1.56 (H) 09/06/2016 1336   CREATININE 1.39 (H) 02/06/2014 1012   CALCIUM 9.0 09/06/2016 1336   PROT 8.1 09/06/2016 1336   ALBUMIN 3.8 09/06/2016 1336   AST 27 09/06/2016 1336   ALT 24 09/06/2016 1336   ALKPHOS 51 09/06/2016 1336   BILITOT 0.3 09/06/2016 1336   GFRNONAA 44 (L) 09/06/2016 1336    GFRAA 51 (L) 09/06/2016 1336       PENDING LABS:   RADIOGRAPHIC STUDIES:  Ct Chest W Contrast  Result Date: 09/06/2016 CLINICAL DATA:  Restaging metastatic prostate cancer EXAM: CT CHEST, ABDOMEN, AND PELVIS WITH CONTRAST TECHNIQUE: Multidetector CT imaging of the chest, abdomen and pelvis was  performed following the standard protocol during bolus administration of intravenous contrast. CONTRAST:  60mL ISOVUE-300 IOPAMIDOL (ISOVUE-300) INJECTION 61% COMPARISON:  01/22/2016 FINDINGS: CT CHEST FINDINGS Cardiovascular: The heart size is normal. No pericardial effusion. Calcification in the LAD, left circumflex and RCA coronary artery noted. Mediastinum/Nodes: Trachea is patent and midline. Normal appearance of the esophagus. No enlarged mediastinal or hilar lymph nodes. No enlarged axillary or supraclavicular adenopathy. Lungs/Pleura: There is no pleural effusion identified. Moderate changes of centrilobular emphysema. Diffuse increased interstitial reticulation is identified within both lungs and appears lower lobe predominant. There are areas of subpleural banding, architectural distortion and traction bronchiectasis identified. No frank honeycombing identified. No suspicious pulmonary nodule or mass identified. Musculoskeletal: Diffuse sclerotic bone metastases are again identified. The appearance is similar to previous exam. Stable mild compression fracture involving the T3 vertebra. CT ABDOMEN PELVIS FINDINGS Hepatobiliary: Hepatic steatosis. No focal liver abnormality. The gallbladder appears normal. No biliary dilatation. Pancreas: Low-density structure within head of pancreas is unchanged measuring 9 mm, image number 69 of series 2. No pancreatic duct dilatation or inflammation noted. Spleen: Normal in size without focal abnormality. Adrenals/Urinary Tract: The adrenal glands are normal. Asymmetric atrophy of the left kidney. Scar involving the upper pole of right kidney is again noted. No mass or  hydronephrosis identified. The urinary bladder is normal. Stomach/Bowel: Small hiatal hernia. The stomach an the small bowel loops have a normal course and caliber. The appendix is visualized and appears normal. Unremarkable appearance of the colon. Vascular/Lymphatic: Aortic atherosclerosis. Again identified is nonspecific periaortic soft tissue thickening, image number 80 of series 2. This is unchanged in appearance from the previous exam. No well defined adenopathy identified. Soft tissue thickening along the left pelvic sidewall is again noted in appears similar to the previous exam measuring 11 mm, image 99 of series 2. Reproductive: Mild prostate gland enlargement. Other: No abdominal wall hernia or abnormality. No abdominopelvic ascites. Musculoskeletal: Multifocal sclerotic bone metastases are again identified. The appearance is unchanged when compared with the previous exam. IMPRESSION: 1. Similar appearance of widespread sclerotic bone metastases. 2. No change in soft tissue thickening within the retroperitoneum and left pelvic sidewall. No well defined adenopathy identified. 3. No new sites of disease 4. Aortic Atherosclerosis (ICD10-I70.0). Coronary artery calcifications noted. 5. 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 6. Stable 9 mm pancreatic cystic lesion. Favor pseudocyst. Indolent neoplasm may look similar. Attention on follow-up imaging. Electronically Signed   By: Kerby Moors M.D.   On: 09/06/2016 15:18   Nm Bone Scan Whole Body  Result Date: 09/06/2016 CLINICAL DATA:  71 year old male with prostate cancer. Subsequent encounter. EXAM: NUCLEAR MEDICINE WHOLE BODY BONE SCAN TECHNIQUE: Whole body anterior and posterior images were obtained approximately 3 hours after intravenous injection of radiopharmaceutical. RADIOPHARMACEUTICALS:  20 mCi Technetium-7m MDP IV COMPARISON:  01/14/2016 bone scan. CT of the chest abdomen pelvis for  same date has been ordered although not completed. Comparison CT of the chest abdomen pelvis which is available is from 01/22/2016. FINDINGS: Radiotracer uptake consistent with diffuse osseous metastatic disease once again noted but less apparent than on prior examination. The most notable area of decreased radiotracer uptake is at involving the anterior left fourth rib. The most notable residual region of radiotracer uptake involves the right T11 vertebra and adjacent rib. Increase radiotracer uptake involving the right mandible probably related to dental disease. Both kidneys visualized. IMPRESSION: In this patient with known diffuse sclerotic metastatic disease, bone scan does not reveal progression  of radiotracer uptake. Several of the areas of previously demonstrated radiotracer uptake appear less prominent. Change in appearance of radiotracer uptake involving the mandible now more notable on the right and previously more notable on the left may reflect result of dental disease rather than metastatic disease. Electronically Signed   By: Genia Del M.D.   On: 09/06/2016 12:13   Ct Abdomen Pelvis W Contrast  Result Date: 09/06/2016 CLINICAL DATA:  Restaging metastatic prostate cancer EXAM: CT CHEST, ABDOMEN, AND PELVIS WITH CONTRAST TECHNIQUE: Multidetector CT imaging of the chest, abdomen and pelvis was performed following the standard protocol during bolus administration of intravenous contrast. CONTRAST:  74mL ISOVUE-300 IOPAMIDOL (ISOVUE-300) INJECTION 61% COMPARISON:  01/22/2016 FINDINGS: CT CHEST FINDINGS Cardiovascular: The heart size is normal. No pericardial effusion. Calcification in the LAD, left circumflex and RCA coronary artery noted. Mediastinum/Nodes: Trachea is patent and midline. Normal appearance of the esophagus. No enlarged mediastinal or hilar lymph nodes. No enlarged axillary or supraclavicular adenopathy. Lungs/Pleura: There is no pleural effusion identified. Moderate changes of  centrilobular emphysema. Diffuse increased interstitial reticulation is identified within both lungs and appears lower lobe predominant. There are areas of subpleural banding, architectural distortion and traction bronchiectasis identified. No frank honeycombing identified. No suspicious pulmonary nodule or mass identified. Musculoskeletal: Diffuse sclerotic bone metastases are again identified. The appearance is similar to previous exam. Stable mild compression fracture involving the T3 vertebra. CT ABDOMEN PELVIS FINDINGS Hepatobiliary: Hepatic steatosis. No focal liver abnormality. The gallbladder appears normal. No biliary dilatation. Pancreas: Low-density structure within head of pancreas is unchanged measuring 9 mm, image number 69 of series 2. No pancreatic duct dilatation or inflammation noted. Spleen: Normal in size without focal abnormality. Adrenals/Urinary Tract: The adrenal glands are normal. Asymmetric atrophy of the left kidney. Scar involving the upper pole of right kidney is again noted. No mass or hydronephrosis identified. The urinary bladder is normal. Stomach/Bowel: Small hiatal hernia. The stomach an the small bowel loops have a normal course and caliber. The appendix is visualized and appears normal. Unremarkable appearance of the colon. Vascular/Lymphatic: Aortic atherosclerosis. Again identified is nonspecific periaortic soft tissue thickening, image number 80 of series 2. This is unchanged in appearance from the previous exam. No well defined adenopathy identified. Soft tissue thickening along the left pelvic sidewall is again noted in appears similar to the previous exam measuring 11 mm, image 99 of series 2. Reproductive: Mild prostate gland enlargement. Other: No abdominal wall hernia or abnormality. No abdominopelvic ascites. Musculoskeletal: Multifocal sclerotic bone metastases are again identified. The appearance is unchanged when compared with the previous exam. IMPRESSION: 1.  Similar appearance of widespread sclerotic bone metastases. 2. No change in soft tissue thickening within the retroperitoneum and left pelvic sidewall. No well defined adenopathy identified. 3. No new sites of disease 4. Aortic Atherosclerosis (ICD10-I70.0). Coronary artery calcifications noted. 5. 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 6. Stable 9 mm pancreatic cystic lesion. Favor pseudocyst. Indolent neoplasm may look similar. Attention on follow-up imaging. Electronically Signed   By: Kerby Moors M.D.   On: 09/06/2016 15:18    ASSESSMENT: Stage IV prostate cancer, hormone refractory disease. HX taxotere HX Bilateral nephrostomy tubes Bilateral orchiectomy History of polysubstance abuse  Anemia, multifactorial  Tobacco Use   71 year old male with stage IV adenocarcinoma of the prostate, hormone refractory disease. Originally diagnosed in late 2015.   Plan: I have reviewed patient's restaging CT scans and his bone scan in detail with him. He has stable  disease without any evidence of progression. His PSA has been trending down.  Continue Jevtana every 3 weeks.  Continue monthly xgeva for his bone disease. Return to clinic in 3 weeks for follow-up.  Refilled his oxycodone today.   All questions were answered. The patient knows to call the clinic with any problems, questions or concerns. We can certainly see the patient much sooner if necessary. This note was electronically signed. Twana First, MD 09/08/2016

## 2016-09-29 ENCOUNTER — Encounter (HOSPITAL_BASED_OUTPATIENT_CLINIC_OR_DEPARTMENT_OTHER): Payer: Medicare Other

## 2016-09-29 ENCOUNTER — Encounter (HOSPITAL_COMMUNITY): Payer: Self-pay | Admitting: Adult Health

## 2016-09-29 ENCOUNTER — Encounter (HOSPITAL_COMMUNITY): Payer: Medicare Other | Attending: Oncology | Admitting: Adult Health

## 2016-09-29 VITALS — BP 115/81 | HR 74 | Resp 16 | Ht 72.0 in | Wt 167.0 lb

## 2016-09-29 VITALS — BP 132/67 | HR 67 | Temp 98.2°F | Resp 18

## 2016-09-29 DIAGNOSIS — Z5189 Encounter for other specified aftercare: Secondary | ICD-10-CM

## 2016-09-29 DIAGNOSIS — M899 Disorder of bone, unspecified: Secondary | ICD-10-CM | POA: Insufficient documentation

## 2016-09-29 DIAGNOSIS — C61 Malignant neoplasm of prostate: Secondary | ICD-10-CM | POA: Diagnosis not present

## 2016-09-29 DIAGNOSIS — C7951 Secondary malignant neoplasm of bone: Secondary | ICD-10-CM | POA: Diagnosis not present

## 2016-09-29 DIAGNOSIS — G893 Neoplasm related pain (acute) (chronic): Secondary | ICD-10-CM | POA: Diagnosis not present

## 2016-09-29 DIAGNOSIS — E538 Deficiency of other specified B group vitamins: Secondary | ICD-10-CM

## 2016-09-29 DIAGNOSIS — Z5111 Encounter for antineoplastic chemotherapy: Secondary | ICD-10-CM

## 2016-09-29 LAB — COMPREHENSIVE METABOLIC PANEL
ALK PHOS: 77 U/L (ref 38–126)
ALT: 21 U/L (ref 17–63)
AST: 30 U/L (ref 15–41)
Albumin: 3.7 g/dL (ref 3.5–5.0)
Anion gap: 7 (ref 5–15)
BUN: 15 mg/dL (ref 6–20)
CALCIUM: 8.8 mg/dL — AB (ref 8.9–10.3)
CO2: 23 mmol/L (ref 22–32)
CREATININE: 1.86 mg/dL — AB (ref 0.61–1.24)
Chloride: 107 mmol/L (ref 101–111)
GFR calc non Af Amer: 35 mL/min — ABNORMAL LOW (ref >60)
GFR, EST AFRICAN AMERICAN: 41 mL/min — AB (ref >60)
Glucose, Bld: 87 mg/dL (ref 65–99)
Potassium: 3.9 mmol/L (ref 3.5–5.1)
SODIUM: 137 mmol/L (ref 135–145)
Total Bilirubin: 0.4 mg/dL (ref 0.3–1.2)
Total Protein: 7.3 g/dL (ref 6.5–8.1)

## 2016-09-29 LAB — CBC WITH DIFFERENTIAL/PLATELET
BASOS ABS: 0 10*3/uL (ref 0.0–0.1)
Basophils Relative: 0 %
Eosinophils Absolute: 0.2 10*3/uL (ref 0.0–0.7)
Eosinophils Relative: 2 %
HCT: 30.5 % — ABNORMAL LOW (ref 39.0–52.0)
HEMOGLOBIN: 10.2 g/dL — AB (ref 13.0–17.0)
LYMPHS ABS: 1.6 10*3/uL (ref 0.7–4.0)
LYMPHS PCT: 16 %
MCH: 31.7 pg (ref 26.0–34.0)
MCHC: 33.4 g/dL (ref 30.0–36.0)
MCV: 94.7 fL (ref 78.0–100.0)
Monocytes Absolute: 0.8 10*3/uL (ref 0.1–1.0)
Monocytes Relative: 8 %
NEUTROS PCT: 74 %
Neutro Abs: 7.3 10*3/uL (ref 1.7–7.7)
Platelets: 308 10*3/uL (ref 150–400)
RBC: 3.22 MIL/uL — ABNORMAL LOW (ref 4.22–5.81)
RDW: 16.9 % — ABNORMAL HIGH (ref 11.5–15.5)
WBC: 9.8 10*3/uL (ref 4.0–10.5)

## 2016-09-29 MED ORDER — SODIUM CHLORIDE 0.9 % IV SOLN: 10.0000 mg | Freq: Once | INTRAVENOUS | Status: DC

## 2016-09-29 MED ORDER — SODIUM CHLORIDE 0.9% FLUSH
10.0000 mL | INTRAVENOUS | Status: DC | PRN
  Administered 2016-09-29: 10 mL
  Filled 2016-09-29: qty 10

## 2016-09-29 MED ORDER — SODIUM CHLORIDE 0.9 % IV SOLN
Freq: Once | INTRAVENOUS | Status: AC
  Administered 2016-09-29: 11:00:00 via INTRAVENOUS

## 2016-09-29 MED ORDER — DEXAMETHASONE SODIUM PHOSPHATE 10 MG/ML IJ SOLN
10.0000 mg | Freq: Once | INTRAMUSCULAR | Status: AC
  Administered 2016-09-29: 10 mg via INTRAVENOUS
  Filled 2016-09-29: qty 1

## 2016-09-29 MED ORDER — CYANOCOBALAMIN 1000 MCG/ML IJ SOLN
1000.0000 ug | Freq: Once | INTRAMUSCULAR | Status: AC
  Administered 2016-09-29: 1000 ug via INTRAMUSCULAR
  Filled 2016-09-29: qty 1

## 2016-09-29 MED ORDER — DEXTROSE 5 % IV SOLN
20.0000 mg/m2 | Freq: Once | INTRAVENOUS | Status: AC
  Administered 2016-09-29: 41 mg via INTRAVENOUS
  Filled 2016-09-29: qty 4.1

## 2016-09-29 MED ORDER — OXYCODONE HCL 15 MG PO TABS: 15.0000 mg | ORAL_TABLET | ORAL | 0 refills | Status: DC | PRN

## 2016-09-29 MED ORDER — DIPHENHYDRAMINE HCL 50 MG/ML IJ SOLN
25.0000 mg | Freq: Once | INTRAMUSCULAR | Status: AC
  Administered 2016-09-29: 25 mg via INTRAVENOUS
  Filled 2016-09-29: qty 1

## 2016-09-29 MED ORDER — PEGFILGRASTIM 6 MG/0.6ML ~~LOC~~ PSKT
6.0000 mg | PREFILLED_SYRINGE | Freq: Once | SUBCUTANEOUS | Status: AC
  Administered 2016-09-29: 6 mg via SUBCUTANEOUS
  Filled 2016-09-29: qty 0.6

## 2016-09-29 MED ORDER — HEPARIN SOD (PORK) LOCK FLUSH 100 UNIT/ML IV SOLN
500.0000 [IU] | Freq: Once | INTRAVENOUS | Status: AC | PRN
  Administered 2016-09-29: 500 [IU]
  Filled 2016-09-29: qty 5

## 2016-09-29 MED ORDER — FAMOTIDINE IN NACL 20-0.9 MG/50ML-% IV SOLN
20.0000 mg | Freq: Once | INTRAVENOUS | Status: AC
  Administered 2016-09-29: 20 mg via INTRAVENOUS
  Filled 2016-09-29: qty 50

## 2016-09-29 NOTE — Patient Instructions (Addendum)
[facility] Discharge Instructions for Patients Receiving Chemotherapy   Beginning January 23rd 2017 lab work for the [facility] will be done in the  Main lab at [facility] on 1st floor. If you have a lab appointment with the [facility] please come in thru the  Main Entrance and check in at the main information desk   Today you received the following chemotherapy agents Jevtana as well as Vit B12 injection and Neulasta on-pro. Follow-up as scheduled. Call clinic for any questions or concerns  To help prevent nausea and vomiting after your treatment, we encourage you to take your nausea medication   If you develop nausea and vomiting, or diarrhea that is not controlled by your medication, call the clinic.  The clinic phone number is [phone]. Office hours are Monday-Friday 8:30am-5:00pm.  BELOW ARE SYMPTOMS THAT SHOULD BE REPORTED IMMEDIATELY:  *FEVER GREATER THAN 101.0 F  *CHILLS WITH OR WITHOUT FEVER  NAUSEA AND VOMITING THAT IS NOT CONTROLLED WITH YOUR NAUSEA MEDICATION  *UNUSUAL SHORTNESS OF BREATH  *UNUSUAL BRUISING OR BLEEDING  TENDERNESS IN MOUTH AND THROAT WITH OR WITHOUT PRESENCE OF ULCERS  *URINARY PROBLEMS  *BOWEL PROBLEMS  UNUSUAL RASH Items with * indicate a potential emergency and should be followed up as soon as possible. If you have an emergency after office hours please contact your primary care physician or go to the nearest emergency department.  Please call the clinic during office hours if you have any questions or concerns.   You may also contact the Patient Navigator at [phone] should you have any questions or need assistance in obtaining follow up care.      Resources For Cancer Patients and their Caregivers ? American Cancer Society: Can assist with transportation, wigs, general needs, runs Look Good Feel Better.        ([phone] 110-1658 ? Cancer Care: Provides financial assistance, online support groups,  medication/co-pay assistance.  1-800-813-HOPE 424-880-5784) ? [place] Assists [place] cancer patients and their families through emotional , educational and financial support.  9072925130 ? [place] DSS Where to apply for food stamps, Medicaid and utility assistance. 8034763335 ? [facility]: Transportation to medical appointments. 848-262-0684 ? Social Security Administration: May apply for disability if have a Stage IV cancer. 712-775-0008 561 273 5638 ? [employer], Disability and Transit Services: Assists with nutrition, care and transit needs. 978-113-2620

## 2016-09-29 NOTE — Progress Notes (Signed)
[place] reviewed with Dr Talbert Cage and pt approved for chemo tx today                                                                     Dacian Orrico tolerated chemo tx including Vit B12 injection and Neulasta on-pro well without complaints or incident. Neulasta on-pro applied to pt's left arm with green indicator light flashing. VSS upon discharge. Pt discharged self ambulatory in satisfactory condition

## 2016-09-29 NOTE — Progress Notes (Signed)
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    BRIEF ONCOLOGIC HISTORY:    Prostate cancer metastatic to multiple sites ([place])   01/26/2014 Tumor Marker    PSA > 5000      01/28/2014 Initial Biopsy    Metastatic adenocarcinoma of prostate      02/05/2014 Surgery    Bilateral orchiectomy by Dr. Junious Silk      02/26/2014 Tumor Marker    PSA = 399.5      03/14/2014 Tumor Marker    PSA= 89.51      03/14/2014 - 06/26/2014 Chemotherapy    Docetaxel 75 mg/kg every 21 days x 6 cycles      04/24/2014 Tumor Marker    PSA- 19.89      07/23/2014 Procedure    Nephrostomy tube removed by IR, Dr. Geroge Baseman      07/24/2014 Tumor Marker    PSA= 6.15      10/16/2014 Tumor Marker    PSA: 3.54       01/08/2015 Tumor Marker    PSA: 2.13       01/17/2015 Imaging    CT CAP-  Massive pelvic and retroperitoneal lymphadenopathy noted on the prior study has nearly completely resolved, now with only a small amount of residual amorphous soft tissue predominantly around the the infrarenal abdominal aorta.      01/17/2015 Imaging    Bone scan- Widespread osseous metastatic disease with multiple foci of increased activity throughout the skeleton, corresponding with the findings on the prior CT from October, 2015.      04/11/2015 Tumor Marker    PSA: 1.87       07/21/2015 Imaging    Bone scan- Bony metastatic disease again noted at multiple sites, stable from prior study. No progression of bony metastatic disease is demonstrable on this study.      07/25/2015 Imaging    CT CAP- Stable matted soft tissue density in the retroperitoneum and extraperitoneal pelvis  consistent with treated disease. No recurrent lymphadenopathy.  No pulmonary metastatic disease. Diffuse stable sclerotic metastatic bone disease.      01/14/2016 Imaging    Bone scan-  Multiple sites of abnormal increased tracer localization involving BILATERAL ribs, T11, T12, questionably RIGHT scapula and LEFT iliac bone suspicious for osseous metastases.      01/23/2016 Imaging    CT CAP- 1. Similar widespread osseous metastasis. 2. Similar soft tissue thickening within the retroperitoneum of the abdomen. Improved left pelvic side wall soft tissue thickening. These are consistent with sites of treated disease. No well-defined adenopathy. 3. No new sites of disease.      05/03/2016 -  Chemotherapy    Jevtana every 21 days       09/06/2016 Imaging    Restaging CT C/A/P: IMPRESSION: 1. Similar appearance of widespread sclerotic bone metastases. 2. No change in soft tissue thickening within the retroperitoneum and left pelvic sidewall. No well defined adenopathy identified. 3. No new sites of disease 4. Aortic Atherosclerosis (ICD10-I70.0). Coronary artery calcifications noted. 5. 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 6. Stable 9 mm pancreatic cystic lesion. Favor pseudocyst. Indolent neoplasm may look similar. Attention  on follow-up imaging.      09/06/2016 Imaging    Bone Scan: IMPRESSION: In this patient with known diffuse sclerotic metastatic disease, bone scan does not reveal progression of radiotracer uptake. Several of the areas of previously demonstrated radiotracer uptake appear less prominent.  Change in appearance of radiotracer uptake involving the mandible now more notable on the right and previously more notable on the left may reflect result of dental disease rather than metastatic disease.         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 Completed Docetaxel x 6 cycles on 06/26/14 with great response.  Now slowly rising PSA (doubling time ~ 9 months) resulting in restarting systemic treatment with Jevtana beginning on 05/03/16    INTERVAL HISTORY:  [name] returns for routine follow-up and consideration for next cycle Jevtana.    Overall, he tells me that he has been feeling very well. His appetite is 100%; energy levels 75%.   He is due for next cycle Jevtana today. He continues to get Niger monthly as well.  Denies any fever/chills. Denies dysuria or hematuria. He has had no bleeding episodes including nosebleeds, gingival bleeding, or blood in his stools or dark/tarry stools. Denies peripheral neuropathy.   He has some continued back pain/bone pain. He takes oxycodone as needed, but states "I usually take about 3-4 pills at a time and then my pain is gone."  His bowels are moving regularly, without laxatives or stool softeners.   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 He really enjoys his work and his chemotherapy does not interfere with his ability to work.   He continues to smoke cigarettes. A pack lasts about 3-4 days. He has been trying to cut back more.      REVIEW OF SYSTEMS:  Review of Systems  Constitutional: Negative.  Negative for chills, fatigue and fever.  HENT:  Negative.  Negative for lump/mass and nosebleeds.   Eyes: Negative.   Respiratory: Negative.  Negative for cough and shortness of breath.   Cardiovascular: Negative.  Negative for chest pain and leg swelling.  Gastrointestinal: Negative.  Negative for abdominal pain, blood in stool, constipation, diarrhea, nausea and vomiting.  Endocrine: Negative.   Genitourinary: Negative.  Negative for dysuria and hematuria.   Musculoskeletal: Positive for back pain. Negative for arthralgias.  Skin: Negative.  Negative for rash.  Neurological: Negative.  Negative for dizziness and headaches.    Hematological: Negative.  Negative for adenopathy. Does not bruise/bleed easily.  Psychiatric/Behavioral: Negative.  Negative for depression and sleep disturbance. The patient is not nervous/anxious.      PAST MEDICAL/SURGICAL HISTORY:  Past Medical History:  Diagnosis Date  . Acute renal failure ([place]) 01/26/2014  . Alcohol abuse   . Anemia of chronic disease 01/26/2014  . Bilateral hydronephrosis 01/26/2014  . History of cocaine abuse 01/26/2014  . Homelessness 01/31/2014  . Prostate cancer metastatic to multiple sites ([place]) 01/30/2014  . Sepsis ([place])   . UTI (lower urinary tract infection)    Past Surgical History:  Procedure Laterality Date  . ORCHIECTOMY Bilateral 02/05/2014   Procedure: BILATERAL ORCHIECTOMY;  Surgeon: Festus Aloe, MD;  Location: [facility];  Service: Urology;  Laterality: Bilateral;  . PERCUTANEOUS NEPHROSTOMY Bilateral    IR Dr. Junious Silk changed on 04/22/2014  . PORTACATH PLACEMENT Right 03/12/14     SOCIAL HISTORY:  Social History   Social History  . Marital status: Single  Spouse name: N/A  . Number of children: 1  . Years of education: 9th   Occupational History  . disabilty [employer]   Social History Main Topics  . Smoking status: Current Every Day Smoker    Packs/day: 0.25    Years: 55.00    Types: Cigarettes  . Smokeless tobacco: Never Used  . Alcohol use No     Comment: none for at least 7 -8 years  . Drug use: Yes    Types: Cocaine     Comment: last used Cocaine 01/25/14  . Sexual activity: Yes   Other Topics Concern  . Not on file   Social History Narrative   Lives at [employer]   1 son, lives in [place], [place]   Can read and write in native language             FAMILY HISTORY:  Family History  Problem Relation Age of Onset  . Cancer Brother 42    CURRENT MEDICATIONS:  Outpatient Encounter Prescriptions as of 09/29/2016  Medication Sig Note  . Cabazitaxel (JEVTANA IV) Inject  into the vein. Every 3 weeks   . calcium-vitamin D (OSCAL WITH D) 500-200 MG-UNIT tablet Take 2 tablets by mouth daily with breakfast.   . lidocaine-prilocaine (EMLA) cream Apply a quarter size amount to port site 1 hour prior to chemo. Do not rub in. Cover with plastic wrap. 07/24/2014: Only takes when getting port flushed  . metoCLOPramide (REGLAN) 5 MG tablet The day after chemo take 1 tab four times a day x 2 days. Then may take 1 tab four times a day if needed for nausea/vomiting. 07/24/2014: Just takes as needed  . ondansetron (ZOFRAN) 8 MG tablet Take 1 tablet (8 mg total) by mouth every 8 (eight) hours as needed for nausea or vomiting.   [name] Kitchen oxyCODONE (OXY IR/ROXICODONE) 5 MG immediate release tablet One to two tablets as needed every 4 hours for pain   . predniSONE (DELTASONE) 10 MG tablet Take 1 tablet (10 mg total) by mouth daily with breakfast.   . prochlorperazine (COMPAZINE) 10 MG tablet The day after chemo take 1 tab four times a day x 2 days. Then may take 1 tab four times a day if needed for nausea/vomiting. 07/24/2014: Just takes as needed   Facility-Administered Encounter Medications as of 09/29/2016  Medication  . cyanocobalamin ((VITAMIN B-12)) injection 1,000 mcg  . denosumab (XGEVA) injection 120 mg    ALLERGIES:  No Known Allergies   PHYSICAL EXAM:  ECOG Performance status: 1 - Symptomatic, but independent.      Physical Exam  Constitutional: He is oriented to person, place, and time and well-developed, well-nourished, and in no distress.  HENT:  Head: Normocephalic.  Mouth/Throat: Oropharynx is clear and moist. No oropharyngeal exudate.  Eyes: Conjunctivae are normal. No scleral icterus.  Neck: Normal range of motion. Neck supple.  Cardiovascular: Normal rate, regular rhythm and normal heart sounds.   Pulmonary/Chest: Effort normal and breath sounds normal. No respiratory distress.    Abdominal: Soft. Bowel sounds are normal. There is no tenderness.   Musculoskeletal: Normal range of motion. He exhibits no edema.  Lymphadenopathy:    He has no cervical adenopathy.  Neurological: He is alert and oriented to person, place, and time. No cranial nerve deficit.  Skin: Skin is warm and dry. No rash noted.  Psychiatric: Mood, memory, affect and judgment normal.     LABORATORY DATA:  I have reviewed  the labs as listed.  CBC    Component Value Date/Time   WBC 3.4 (L) 09/06/2016 1336   RBC 3.69 (L) 09/06/2016 1336   HGB 11.4 (L) 09/06/2016 1336   HCT 34.4 (L) 09/06/2016 1336   PLT 309 09/06/2016 1336   MCV 93.2 09/06/2016 1336   MCH 30.9 09/06/2016 1336   MCHC 33.1 09/06/2016 1336   RDW 16.0 (H) 09/06/2016 1336   LYMPHSABS 1.6 09/06/2016 1336   MONOABS 0.4 09/06/2016 1336   EOSABS 0.0 09/06/2016 1336   BASOSABS 0.0 09/06/2016 1336   CMP Latest Ref Rng & Units 09/06/2016 09/06/2016 08/18/2016  Glucose 65 - 99 mg/dL 92 - 84  BUN 6 - 20 mg/dL 15 - 18  Creatinine 0.61 - 1.24 mg/dL 1.56(H) 1.80(H) 1.92(H)  Sodium 135 - 145 mmol/L 134(L) - 133(L)  Potassium 3.5 - 5.1 mmol/L 4.4 - 4.2  Chloride 101 - 111 mmol/L 101 - 102  CO2 22 - 32 mmol/L 26 - 24  Calcium 8.9 - 10.3 mg/dL 9.0 - 9.1  Total Protein 6.5 - 8.1 g/dL 8.1 - 7.8  Total Bilirubin 0.3 - 1.2 mg/dL 0.3 - 0.5  Alkaline Phos 38 - 126 U/L 51 - 63  AST 15 - 41 U/L 27 - 24  ALT 17 - 63 U/L 24 - 16(L)     PENDING LABS:    DIAGNOSTIC IMAGING:  Last CT chest/abd/pelvis: 09/06/16      Last Bone scan: 09/06/16       PATHOLOGY:  (R) lower right pelvis biopsy: 01/28/14       ASSESSMENT & PLAN:   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  -Clinically, he is tolerating Jevtana very well; Previous PSA values since starting Jevtana 3.96, 3.92, & 3.42.  PSA has been appropriately trending down, which supports good clinical response to therapy. Most recent PSA 1.18 in 08/2016.  -Labs reviewed and  are adequate for next cycle Jevtana today. -Last restaging bone scan & CT chest/abd/pelvis on 09/06/16 overall showed stability. Will need repeat imaging after 2-3 more cycles.   -Return to cancer center in 3 weeks for consideration next cycle Jevtana.  Bone mets:  -Continue monthly Xgeva injections to help reduce risk of skeletal related events.   Pain secondary to metastatic disease:  -He has been taking about 15-20 mg of oxycodone once per day; taking 3-4 tabs at one time.   -Strongly recommended he only take the pain medication as prescribed. He should not take more than prescribed.  Since he is generally taking 15 mg oxycodone per day, I provided him a new prescription for Oxycodone 15 mg tabs. Gave him strict instruction not to take more than 1 tablet at a time. Shared with him that taking more than 1 tablet could be very dangerous. He was able to verbalize these instructions and repeated them back to me.  -[place] Controlled Substance Reporting System reviewed.      Bowel regimen:  -Stable per patient report.  -Bowels moving regularly without additional intervention; he understands the importance of adequate bowel regimen while taking opiates.   Tobacco use disorder:  -We discussed the pathophysiology of nicotine dependence and different strategies to stop smoking. The gold standard of tobacco cessation is nicotine replacement (with nicotine patches & gum/lozenges) or Varenicline (Chantix).  He has used patches in the past and tolerated them well. Recommended he resume using 21 mg patch. He also needs more short-acting nicotine replacement to help with  cravings. Encouraged him to set some new "rules" that do not allow him to smoke at times he is triggered to smoke; instead he could use a piece of nicotine gum or a lozenge when she has a craving. I gave him instructions on how to use these nicotine replacement products.  We will continue to provide support in his smoking cessation efforts..   Having a clear "quit plan" is much more effective and requires a step-wise approach with continued support from a tobacco treatment specialist.  I encouraged him to reach out to me if he has further questions or interest in his continued cessation efforts.  Greater than 5 minutes was spent in smoking cessation counseling with this patient.        Dispo:  -Return to cancer center in 3 weeks for follow-up and for consideration of next cycle chemotherapy.  -Continue Xgeva injections monthly as well.    All questions were answered to patient's stated satisfaction. Encouraged patient to call with any new concerns or questions before his next visit to the cancer center and we can certain see him sooner, if needed.     Plan of care discussed with Dr. Talbert Cage, who agrees with the above aforementioned.    Mike Craze, NP [place] [phone]

## 2016-10-04 ENCOUNTER — Other Ambulatory Visit (HOSPITAL_COMMUNITY): Payer: Self-pay | Admitting: *Deleted

## 2016-10-04 DIAGNOSIS — C61 Malignant neoplasm of prostate: Secondary | ICD-10-CM

## 2016-10-06 ENCOUNTER — Encounter (HOSPITAL_COMMUNITY): Payer: Self-pay

## 2016-10-06 ENCOUNTER — Encounter (HOSPITAL_BASED_OUTPATIENT_CLINIC_OR_DEPARTMENT_OTHER): Payer: Medicare Other

## 2016-10-06 ENCOUNTER — Encounter (HOSPITAL_COMMUNITY): Payer: Medicare Other

## 2016-10-06 VITALS — BP 126/78 | HR 79 | Temp 97.4°F | Resp 18

## 2016-10-06 DIAGNOSIS — M899 Disorder of bone, unspecified: Secondary | ICD-10-CM | POA: Diagnosis not present

## 2016-10-06 DIAGNOSIS — C61 Malignant neoplasm of prostate: Secondary | ICD-10-CM | POA: Diagnosis not present

## 2016-10-06 DIAGNOSIS — C7951 Secondary malignant neoplasm of bone: Secondary | ICD-10-CM | POA: Diagnosis not present

## 2016-10-06 LAB — COMPREHENSIVE METABOLIC PANEL
ALK PHOS: 99 U/L (ref 38–126)
ALT: 23 U/L (ref 17–63)
AST: 27 U/L (ref 15–41)
Albumin: 3.9 g/dL (ref 3.5–5.0)
Anion gap: 8 (ref 5–15)
BUN: 10 mg/dL (ref 6–20)
CALCIUM: 8.8 mg/dL — AB (ref 8.9–10.3)
CHLORIDE: 109 mmol/L (ref 101–111)
CO2: 23 mmol/L (ref 22–32)
CREATININE: 1.65 mg/dL — AB (ref 0.61–1.24)
GFR calc Af Amer: 47 mL/min — ABNORMAL LOW (ref >60)
GFR calc non Af Amer: 41 mL/min — ABNORMAL LOW (ref >60)
Glucose, Bld: 83 mg/dL (ref 65–99)
Potassium: 3.7 mmol/L (ref 3.5–5.1)
SODIUM: 140 mmol/L (ref 135–145)
Total Bilirubin: 0.4 mg/dL (ref 0.3–1.2)
Total Protein: 7.5 g/dL (ref 6.5–8.1)

## 2016-10-06 LAB — CBC WITH DIFFERENTIAL/PLATELET
Basophils Absolute: 0 10*3/uL (ref 0.0–0.1)
Basophils Relative: 0 %
EOS ABS: 0 10*3/uL (ref 0.0–0.7)
EOS PCT: 0 %
HCT: 32.1 % — ABNORMAL LOW (ref 39.0–52.0)
HEMOGLOBIN: 10.7 g/dL — AB (ref 13.0–17.0)
LYMPHS PCT: 8 %
Lymphs Abs: 2.4 10*3/uL (ref 0.7–4.0)
MCH: 31.2 pg (ref 26.0–34.0)
MCHC: 33.3 g/dL (ref 30.0–36.0)
MCV: 93.6 fL (ref 78.0–100.0)
MONO ABS: 1.8 10*3/uL — AB (ref 0.1–1.0)
Monocytes Relative: 6 %
Neutro Abs: 25.5 10*3/uL — ABNORMAL HIGH (ref 1.7–7.7)
Neutrophils Relative %: 86 %
PLATELETS: 299 10*3/uL (ref 150–400)
RBC: 3.43 MIL/uL — AB (ref 4.22–5.81)
RDW: 17.4 % — ABNORMAL HIGH (ref 11.5–15.5)
WBC: 29.7 10*3/uL — AB (ref 4.0–10.5)

## 2016-10-06 MED ORDER — DENOSUMAB 120 MG/1.7ML ~~LOC~~ SOLN
120.0000 mg | Freq: Once | SUBCUTANEOUS | Status: AC
  Administered 2016-10-06: 120 mg via SUBCUTANEOUS
  Filled 2016-10-06: qty 1.7

## 2016-10-06 NOTE — Progress Notes (Signed)
[place] reviewed with Dr. Oliva Bustard and pt approved for Xgeva injection                                                                     Jeff [name] tolerated Xgeva injection well without complaints. Pt denied any tooth,jaw or leg pain prior to administering this injection. VSS Pt discharged self ambulatory in satisfactory condition

## 2016-10-06 NOTE — Patient Instructions (Signed)
[facility] at [facility] Discharge Instructions  RECOMMENDATIONS MADE BY THE CONSULTANT AND ANY TEST RESULTS WILL BE SENT TO YOUR REFERRING PHYSICIAN.  Received Xgeva injection today. Follow-up as scheduled. Call clinic for any questions or concerns  Thank you for choosing [facility] at [facility] to provide your oncology and hematology care.  To afford each patient quality time with our provider, please arrive at least 15 minutes before your scheduled appointment time.    If you have a lab appointment with the Cancer Center please come in thru the  Main Entrance and check in at the main information desk  You need to re-schedule your appointment should you arrive 10 or more minutes late.  We strive to give you quality time with our providers, and arriving late affects you and other patients whose appointments are after yours.  Also, if you no show three or more times for appointments you may be dismissed from the clinic at the providers discretion.     Again, thank you for choosing [facility].  Our hope is that these requests will decrease the amount of time that you wait before being seen by our physicians.       _____________________________________________________________  Should you have questions after your visit to [facility], please contact our office at [phone] between the hours of 8:30 a.m. and 4:30 p.m.  Voicemails left after 4:30 p.m. will not be returned until the following business day.  For prescription refill requests, have your pharmacy contact our office.       Resources For Cancer Patients and their Caregivers ? American Cancer Society: Can assist with transportation, wigs, general needs, runs Look Good Feel Better.        1-888-227-6333 ? Cancer Care: Provides financial assistance, online support groups, medication/co-pay assistance.  1-800-813-HOPE (4673) ? [facility] Assists [place] cancer patients and their families through emotional , educational and financial support.  336-427-4357 ? [place] DSS Where to apply for food stamps, Medicaid and utility assistance. 336-342-1394 ? [facility]: Transportation to medical appointments. 336-347-2287 ? Social Security Administration: May apply for disability if have a Stage IV cancer. 336-342-7796 1-800-772-1213 ? [place] Aging, Disability and Transit Services: Assists with nutrition, care and transit needs. 336-349-2343  Cancer Center Support Programs: @10RELATIVEDAYS@ > Cancer Support Group  2nd Tuesday of the month 1pm-2pm, Journey Room  > Creative Journey  3rd Tuesday of the month 1130am-1pm, Journey Room  > Look Good Feel Better  1st Wednesday of the month 10am-12 noon, Journey Room (Call American Cancer Society to register 1-800-395-5775)   

## 2016-10-20 ENCOUNTER — Encounter (HOSPITAL_BASED_OUTPATIENT_CLINIC_OR_DEPARTMENT_OTHER): Payer: Medicare Other

## 2016-10-20 ENCOUNTER — Encounter (HOSPITAL_COMMUNITY): Payer: Medicare Other

## 2016-10-20 ENCOUNTER — Encounter (HOSPITAL_BASED_OUTPATIENT_CLINIC_OR_DEPARTMENT_OTHER): Payer: Medicare Other | Admitting: Adult Health

## 2016-10-20 ENCOUNTER — Encounter (HOSPITAL_COMMUNITY): Payer: Self-pay | Admitting: Adult Health

## 2016-10-20 VITALS — BP 130/90 | HR 71 | Temp 97.5°F | Resp 18 | Wt 171.4 lb

## 2016-10-20 DIAGNOSIS — C7951 Secondary malignant neoplasm of bone: Secondary | ICD-10-CM | POA: Diagnosis not present

## 2016-10-20 DIAGNOSIS — Z5111 Encounter for antineoplastic chemotherapy: Secondary | ICD-10-CM | POA: Diagnosis not present

## 2016-10-20 DIAGNOSIS — M899 Disorder of bone, unspecified: Secondary | ICD-10-CM | POA: Diagnosis not present

## 2016-10-20 DIAGNOSIS — G893 Neoplasm related pain (acute) (chronic): Secondary | ICD-10-CM | POA: Diagnosis not present

## 2016-10-20 DIAGNOSIS — C61 Malignant neoplasm of prostate: Secondary | ICD-10-CM | POA: Diagnosis not present

## 2016-10-20 DIAGNOSIS — Z5189 Encounter for other specified aftercare: Secondary | ICD-10-CM | POA: Diagnosis not present

## 2016-10-20 LAB — CBC WITH DIFFERENTIAL/PLATELET
BASOS ABS: 0 10*3/uL (ref 0.0–0.1)
Basophils Relative: 0 %
Eosinophils Absolute: 0.1 10*3/uL (ref 0.0–0.7)
Eosinophils Relative: 1 %
HEMATOCRIT: 32.8 % — AB (ref 39.0–52.0)
Hemoglobin: 10.8 g/dL — ABNORMAL LOW (ref 13.0–17.0)
LYMPHS ABS: 1.4 10*3/uL (ref 0.7–4.0)
LYMPHS PCT: 25 %
MCH: 30.5 pg (ref 26.0–34.0)
MCHC: 32.9 g/dL (ref 30.0–36.0)
MCV: 92.7 fL (ref 78.0–100.0)
Monocytes Absolute: 0.6 10*3/uL (ref 0.1–1.0)
Monocytes Relative: 11 %
NEUTROS ABS: 3.5 10*3/uL (ref 1.7–7.7)
Neutrophils Relative %: 63 %
Platelets: 360 10*3/uL (ref 150–400)
RBC: 3.54 MIL/uL — ABNORMAL LOW (ref 4.22–5.81)
RDW: 17 % — AB (ref 11.5–15.5)
WBC: 5.5 10*3/uL (ref 4.0–10.5)

## 2016-10-20 LAB — COMPREHENSIVE METABOLIC PANEL
ALK PHOS: 59 U/L (ref 38–126)
ALT: 12 U/L — AB (ref 17–63)
AST: 22 U/L (ref 15–41)
Albumin: 3.7 g/dL (ref 3.5–5.0)
Anion gap: 6 (ref 5–15)
BUN: 15 mg/dL (ref 6–20)
CALCIUM: 8.3 mg/dL — AB (ref 8.9–10.3)
CHLORIDE: 108 mmol/L (ref 101–111)
CO2: 23 mmol/L (ref 22–32)
CREATININE: 1.68 mg/dL — AB (ref 0.61–1.24)
GFR calc Af Amer: 46 mL/min — ABNORMAL LOW (ref >60)
GFR, EST NON AFRICAN AMERICAN: 40 mL/min — AB (ref >60)
Glucose, Bld: 109 mg/dL — ABNORMAL HIGH (ref 65–99)
Potassium: 4.2 mmol/L (ref 3.5–5.1)
Sodium: 137 mmol/L (ref 135–145)
Total Bilirubin: 0.3 mg/dL (ref 0.3–1.2)
Total Protein: 7.5 g/dL (ref 6.5–8.1)

## 2016-10-20 LAB — PSA: PROSTATIC SPECIFIC ANTIGEN: 0.94 ng/mL (ref 0.00–4.00)

## 2016-10-20 MED ORDER — HEPARIN SOD (PORK) LOCK FLUSH 100 UNIT/ML IV SOLN
INTRAVENOUS | Status: AC
  Filled 2016-10-20: qty 5

## 2016-10-20 MED ORDER — DEXTROSE 5 % IV SOLN
20.0000 mg/m2 | Freq: Once | INTRAVENOUS | Status: AC
  Administered 2016-10-20: 41 mg via INTRAVENOUS
  Filled 2016-10-20: qty 4.1

## 2016-10-20 MED ORDER — OXYCODONE HCL 15 MG PO TABS: 15.0000 mg | ORAL_TABLET | ORAL | 0 refills | Status: DC | PRN

## 2016-10-20 MED ORDER — DEXAMETHASONE SODIUM PHOSPHATE 10 MG/ML IJ SOLN
10.0000 mg | Freq: Once | INTRAMUSCULAR | Status: AC
  Administered 2016-10-20: 10 mg via INTRAVENOUS
  Filled 2016-10-20: qty 1

## 2016-10-20 MED ORDER — FAMOTIDINE IN NACL 20-0.9 MG/50ML-% IV SOLN
20.0000 mg | Freq: Once | INTRAVENOUS | Status: AC
  Administered 2016-10-20: 20 mg via INTRAVENOUS
  Filled 2016-10-20: qty 50

## 2016-10-20 MED ORDER — DIPHENHYDRAMINE HCL 50 MG/ML IJ SOLN
25.0000 mg | Freq: Once | INTRAMUSCULAR | Status: AC
  Administered 2016-10-20: 25 mg via INTRAVENOUS
  Filled 2016-10-20: qty 1

## 2016-10-20 MED ORDER — SODIUM CHLORIDE 0.9 % IV SOLN
Freq: Once | INTRAVENOUS | Status: AC
  Administered 2016-10-20: 10:00:00 via INTRAVENOUS

## 2016-10-20 MED ORDER — PEGFILGRASTIM 6 MG/0.6ML ~~LOC~~ PSKT
6.0000 mg | PREFILLED_SYRINGE | Freq: Once | SUBCUTANEOUS | Status: AC
  Administered 2016-10-20: 6 mg via SUBCUTANEOUS
  Filled 2016-10-20: qty 0.6

## 2016-10-20 MED ORDER — HEPARIN SOD (PORK) LOCK FLUSH 100 UNIT/ML IV SOLN
500.0000 [IU] | Freq: Once | INTRAVENOUS | Status: AC | PRN
  Administered 2016-10-20: 500 [IU]
  Filled 2016-10-20: qty 5

## 2016-10-20 MED ORDER — SODIUM CHLORIDE 0.9 % IV SOLN: 10.0000 mg | Freq: Once | INTRAVENOUS | Status: DC

## 2016-10-20 NOTE — Progress Notes (Signed)
Labs reviewed with Dr. Talbert Cage - okay to tx today per MD.   Tolerated tx w/o adverse reaction.  Alert, in no distress.  VSS.  Discharged ambulatory.

## 2016-10-20 NOTE — Progress Notes (Signed)
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    BRIEF ONCOLOGIC HISTORY:    Prostate cancer metastatic to multiple sites (Jeff [name])   01/26/2014 Tumor Marker    PSA > 5000      01/28/2014 Initial Biopsy    Metastatic adenocarcinoma of prostate      02/05/2014 Surgery    Bilateral orchiectomy by Dr. Junious [name]      02/26/2014 Tumor Marker    PSA = 399.5      03/14/2014 Tumor Marker    PSA= 89.51      03/14/2014 - 06/26/2014 Chemotherapy    Docetaxel 75 mg/kg every 21 days x 6 cycles      04/24/2014 Tumor Marker    PSA- 19.89      07/23/2014 Procedure    Nephrostomy tube removed by IR, Dr. Geroge [name]      07/24/2014 Tumor Marker    PSA= 6.15      10/16/2014 Tumor Marker    PSA: 3.54       01/08/2015 Tumor Marker    PSA: 2.13       01/17/2015 Imaging    CT CAP-  Massive pelvic and retroperitoneal lymphadenopathy noted on the prior study has nearly completely resolved, now with only a small amount of residual amorphous soft tissue predominantly around the the infrarenal abdominal aorta.      01/17/2015 Imaging    Bone scan- Widespread osseous metastatic disease with multiple foci of increased activity throughout the skeleton, corresponding with the findings on the prior CT from October, 2015.      04/11/2015 Tumor Marker    PSA: 1.87       07/21/2015 Imaging    Bone scan- Bony metastatic disease again noted at multiple sites, stable from prior study. No progression of bony metastatic disease is demonstrable on this study.      07/25/2015 Imaging    CT CAP- Stable matted soft tissue density in the retroperitoneum and extraperitoneal pelvis  consistent with treated disease. No recurrent lymphadenopathy.  No pulmonary metastatic disease. Diffuse stable sclerotic metastatic bone disease.      01/14/2016 Imaging    Bone scan-  Multiple sites of abnormal increased tracer localization involving BILATERAL ribs, T11, T12, questionably RIGHT scapula and LEFT iliac bone suspicious for osseous metastases.      01/23/2016 Imaging    CT CAP- 1. Similar widespread osseous metastasis. 2. Similar soft tissue thickening within the retroperitoneum of the abdomen. Improved left pelvic side wall soft tissue thickening. These are consistent with sites of treated disease. No well-defined adenopathy. 3. No new sites of disease.      05/03/2016 -  Chemotherapy    Jevtana every 21 days       09/06/2016 Imaging    Restaging CT C/A/P: IMPRESSION: 1. Similar appearance of widespread sclerotic bone metastases. 2. No change in soft tissue thickening within the retroperitoneum and left pelvic sidewall. No well defined adenopathy identified. 3. No new sites of disease 4. Aortic Atherosclerosis (ICD10-I70.0). Coronary artery calcifications noted. 5. 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 6. Stable 9 mm pancreatic cystic lesion. Favor pseudocyst. Indolent neoplasm may look similar.  Attention on follow-up imaging.      09/06/2016 Imaging    Bone Scan: IMPRESSION: In this patient with known diffuse sclerotic metastatic disease, bone scan does not reveal progression of radiotracer uptake. Several of the areas of previously demonstrated radiotracer uptake appear less prominent.  Change in appearance of radiotracer uptake involving the mandible now more notable on the right and previously more notable on the left may reflect result of dental disease rather than metastatic disease.         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 Completed Docetaxel x 6 cycles on 06/26/14 with great response.  Now slowly rising PSA (doubling time ~ 9 months) resulting in restarting systemic treatment with Jevtana beginning on 05/03/16    INTERVAL HISTORY:  Jeff [name] returns for routine follow-up and consideration for next cycle Jevtana.    Overall, he tells me that he has been feeling very well. His appetite is 100%; energy levels 75%.   He is due for next cycle Jevtana today. He continues to get Niger monthly as well.  Overall, he has no complaints today.  Denies any fever or chills.  Denies N&V, diarrhea, constipation, dysuria, or hematuria. No blood in his stools or dark/tarry stools.    We increased his pain medication dosage at his last visit. States that his pain is well-controlled. Generally takes 1-2 oxycodone 15 mg daily.  Most of his pain is back/bone pain.  He tells me that he has about 4-5 days worth of pain pills left; I will check to see if he can receive a prescription refill today.   He tells me that his "boss man" Jeff [name] would like to speak to me today. Jeff [name] gave me permission to talk with Jeff [name] about his condition.  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    Jeff [name] wants to make sure that Jeff [name] or Jeff [name] are "on his chart" for [place] to be able to speak to about his health.  I will verify this today with our front office staff.   He continues to use tobacco, but has been working on cutting back.  He is now smoking more of "the little cigars that come 2-3 in a pack."  This generally lasts him several days.    Overall, he feels very well and is ready for his next cycle of Jevtana.      REVIEW  OF SYSTEMS:  Review of Systems  Constitutional: Negative.  Negative for chills, fatigue and fever.  HENT:  Negative.  Negative for lump/mass and nosebleeds.   Eyes: Negative.   Respiratory: Negative.  Negative for cough and shortness of breath.   Cardiovascular: Negative.  Negative for chest pain and leg swelling.  Gastrointestinal: Negative.  Negative for abdominal pain, blood in stool, constipation, diarrhea, nausea and vomiting.  Endocrine: Negative.   Genitourinary: Negative.  Negative for dysuria and hematuria.   Musculoskeletal: Positive for back pain. Negative for arthralgias.  Skin: Negative.  Negative for rash.  Neurological: Negative.  Negative for dizziness and headaches.  Hematological: Negative.  Negative for adenopathy. Does not bruise/bleed easily.  Psychiatric/Behavioral: Negative.  Negative for depression  and sleep disturbance. The patient is not nervous/anxious.      PAST MEDICAL/SURGICAL HISTORY:  Past Medical History:  Diagnosis Date  . Acute renal failure ([place]) 01/26/2014  . Alcohol abuse   . Anemia of chronic disease 01/26/2014  . Bilateral hydronephrosis 01/26/2014  . History of cocaine abuse 01/26/2014  . Homelessness 01/31/2014  . Prostate cancer metastatic to multiple sites ([place]) 01/30/2014  . Sepsis ([place])   . UTI (lower urinary tract infection)    Past Surgical History:  Procedure Laterality Date  . ORCHIECTOMY Bilateral 02/05/2014   Procedure: BILATERAL ORCHIECTOMY;  Surgeon: Festus Aloe, MD;  Location: [facility];  Service: Urology;  Laterality: Bilateral;  . PERCUTANEOUS NEPHROSTOMY Bilateral    IR Dr. Junious [name] changed on 04/22/2014  . PORTACATH PLACEMENT Right 03/12/14     SOCIAL HISTORY:  Social History   Social History  . Marital status: Single    Spouse name: N/A  . Number of children: 1  . Years of education: 9th   Occupational History  . disabilty [employer]   Social History Main Topics  . Smoking status: Current Every Day  Smoker    Packs/day: 0.25    Years: 55.00    Types: Cigarettes  . Smokeless tobacco: Never Used  . Alcohol use No     Comment: none for at least 7 -8 years  . Drug use: Yes    Types: Cocaine     Comment: last used Cocaine 01/25/14  . Sexual activity: Yes   Other Topics Concern  . Not on file   Social History Narrative   Lives at [employer] 9[employer]   1 son, lives in [place], [place]   Can read and write in native language             FAMILY HISTORY:  Family History  Problem Relation Age of Onset  . Cancer Brother 23    CURRENT MEDICATIONS:  Outpatient Encounter Prescriptions as of 10/20/2016  Medication Sig Note  . Cabazitaxel (JEVTANA IV) Inject into the vein. Every 3 weeks   . lidocaine-prilocaine (EMLA) cream Apply a quarter size amount to port site 1 hour prior to chemo. Do not rub in. Cover with plastic wrap. 07/24/2014: Only takes when getting port flushed  . metoCLOPramide (REGLAN) 5 MG tablet The day after chemo take 1 tab four times a day x 2 days. Then may take 1 tab four times a day if needed for nausea/vomiting. 07/24/2014: Just takes as needed  . ondansetron (ZOFRAN) 8 MG tablet Take 1 tablet (8 mg total) by mouth every 8 (eight) hours as needed for nausea or vomiting.   . predniSONE (DELTASONE) 10 MG tablet Take 1 tablet (10 mg total) by mouth daily with breakfast.   . prochlorperazine (COMPAZINE) 10 MG tablet The day after chemo take 1 tab four times a day x 2 days. Then may take 1 tab four times a day if needed for nausea/vomiting. 07/24/2014: Just takes as needed  . calcium-vitamin D (OSCAL WITH D) 500-200 MG-UNIT tablet Take 2 tablets by mouth daily with breakfast. (Patient not taking: Reported on 10/20/2016)   . oxyCODONE (ROXICODONE) 15 MG immediate release tablet Take 1 tablet (15 mg total) by mouth every 4 (four) hours as needed for pain. (Patient not taking: Reported on 10/20/2016)    Facility-Administered Encounter Medications as of  10/20/2016  Medication  . cyanocobalamin ((VITAMIN B-12)) injection 1,000 mcg  . denosumab (XGEVA) injection 120 mg  ALLERGIES:  No Known Allergies   PHYSICAL EXAM:  ECOG Performance status: 1 - Symptomatic, but independent.      Physical Exam  Constitutional: He is oriented to person, place, and time and well-developed, well-nourished, and in no distress.  HENT:  Head: Normocephalic.  Mouth/Throat: Oropharynx is clear and moist. No oropharyngeal exudate.  Eyes: Conjunctivae are normal. Pupils are equal, round, and reactive to light. No scleral icterus.  Neck: Normal range of motion. Neck supple.  Cardiovascular: Normal rate, regular rhythm and normal heart sounds.   Pulmonary/Chest: Effort normal and breath sounds normal. No respiratory distress.    Abdominal: Soft. Bowel sounds are normal. There is no tenderness.  Musculoskeletal: Normal range of motion. He exhibits no edema.  Lymphadenopathy:    He has no cervical adenopathy.       Right: No supraclavicular adenopathy present.       Left: No supraclavicular adenopathy present.  Neurological: He is alert and oriented to person, place, and time. No cranial nerve deficit. Gait normal.  Skin: Skin is warm and dry. No rash noted.  Psychiatric: Mood, memory, affect and judgment normal.  Nursing note and vitals reviewed.    LABORATORY DATA:  I have reviewed the labs as listed.  CBC    Component Value Date/Time   WBC 5.5 10/20/2016 0848   RBC 3.54 (L) 10/20/2016 0848   HGB 10.8 (L) 10/20/2016 0848   HCT 32.8 (L) 10/20/2016 0848   PLT 360 10/20/2016 0848   MCV 92.7 10/20/2016 0848   MCH 30.5 10/20/2016 0848   MCHC 32.9 10/20/2016 0848   RDW 17.0 (H) 10/20/2016 0848   LYMPHSABS 1.4 10/20/2016 0848   MONOABS 0.6 10/20/2016 0848   EOSABS 0.1 10/20/2016 0848   BASOSABS 0.0 10/20/2016 0848   CMP Latest Ref Rng & Units 10/20/2016 10/06/2016 09/29/2016  Glucose 65 - 99 mg/dL 109(H) 83 87  BUN 6 - 20 mg/dL 15 10 15     Creatinine 0.61 - 1.24 mg/dL 1.68(H) 1.65(H) 1.86(H)  Sodium 135 - 145 mmol/L 137 140 137  Potassium 3.5 - 5.1 mmol/L 4.2 3.7 3.9  Chloride 101 - 111 mmol/L 108 109 107  CO2 22 - 32 mmol/L 23 23 23   Calcium 8.9 - 10.3 mg/dL 8.3(L) 8.8(L) 8.8(L)  Total Protein 6.5 - 8.1 g/dL 7.5 7.5 7.3  Total Bilirubin 0.3 - 1.2 mg/dL 0.3 0.4 0.4  Alkaline Phos 38 - 126 U/L 59 99 77  AST 15 - 41 U/L 22 27 30   ALT 17 - 63 U/L 12(L) 23 21       PENDING LABS:    DIAGNOSTIC IMAGING:  Last CT chest/abd/pelvis: 09/06/16      Last Bone scan: 09/06/16       PATHOLOGY:  (R) lower right pelvis biopsy: 01/28/14       ASSESSMENT & PLAN:   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  -Clinically, he is tolerating Jevtana very well; Previous PSA values since starting Jevtana 3.96, 3.92, & 3.42.  PSA has been appropriately trending down, which supports good clinical response to therapy. Most recent PSA 0.94 today, which is excellent.  Last restaging bone scan & CT chest/abd/pelvis on 09/06/16 overall showed stability. -Labs reviewed and are adequate for next cycle Jevtana today. -He will be due for restaging CT chest/abd/pelvis and skeletal survey in 11/2016; orders placed today.  Ordered skeletal survey in lieu of bone scan as it is difficult to ascertain response to treatment  with known bone mets with bone scan.  -Return to cancer center every 3 weeks for Jevtana.  -Return to cancer center for follow-up in 11/2016 after restaging scans are completed to discuss subsequent treatment plans.   Bone mets:  -Continue monthly Xgeva injections to help reduce risk of skeletal related events.  Oncology Flowsheet 10/06/2016  denosumab (XGEVA) [place] 120 mg    Pain secondary to metastatic disease:  -Tolerating oxycodone 15 mg tabs well; taking appropriately (1-2 tabs/day). Pain well-controlled. Bowels are moving well.  -[place] Controlled  Substance Reporting System reviewed. Paper prescription for oxycodone refill given to patient today.      Bowel regimen:  -Stable per patient report.  -Bowels moving regularly without additional intervention; he understands the importance of adequate bowel regimen while taking opiates.   Tobacco use disorder:  -Commended his efforts to cut back on tobacco use. Encouraged him to continue the hard work on stopping all together.  We have discussed the use of patches and Chantix in the past, but he is "working on it" without these aids. Will continue to offer support for him going forward.      Dispo:  -Continue Jevtana every 3 weeks. Continue monthly Xgeva.  -Restaging CT chest/abd/pelvis and skeletal survey in 11/2016; orders placed today.  -Return to cancer center for follow-up in 11/2016 after restaging scans and for subsequent treatment planning.     All questions were answered to patient's stated satisfaction. Encouraged patient to call with any new concerns or questions before his next visit to the cancer center and we can certain see him sooner, if needed.     Plan of care discussed with Dr. Talbert Cage, who agrees with the above aforementioned.    Mike Craze, NP [place] [phone]

## 2016-10-20 NOTE — Patient Instructions (Addendum)
[place] at [facility] Discharge Instructions  RECOMMENDATIONS MADE BY THE CONSULTANT AND ANY TEST RESULTS WILL BE SENT TO YOUR REFERRING PHYSICIAN.  You saw Jeff Craze, NP, today Continue chemo every 3 weeks and monthly Xgeva CT chest/abd/pelvis and skeletal bone survey in 2 months RTC in 2 months after restaging imaging to FU with Dr. Talbert Cage and chemo.  Prescriptions given to you for pain medication. See Amy at checkout for appointments.   Thank you for choosing [place] at [facility] to provide your oncology and hematology care.  To afford each patient quality time with our provider, please arrive at least 15 minutes before your scheduled appointment time.    If you have a lab appointment with the [facility] please come in thru the  Main Entrance and check in at the main information desk  You need to re-schedule your appointment should you arrive 10 or more minutes late.  We strive to give you quality time with our providers, and arriving late affects you and other patients whose appointments are after yours.  Also, if you no show three or more times for appointments you may be dismissed from the clinic at the providers discretion.     Again, thank you for choosing [facility].  Our hope is that these requests will decrease the amount of time that you wait before being seen by our physicians.       _____________________________________________________________  Should you have questions after your visit to [facility], please contact our office at [phone] between the hours of 8:30 a.m. and 4:30 p.m.  Voicemails left after 4:30 p.m. will not be returned until the following business day.  For prescription refill requests, have your pharmacy contact our office.       Resources For Cancer Patients and their Caregivers ? American Cancer Society: Can assist with transportation, wigs, general needs,  runs Look Good Feel Better.        440-484-2572 ? Cancer Care: Provides financial assistance, online support groups, medication/co-pay assistance.  1-800-813-HOPE 713 727 7088) ? [place] Assists [place] cancer patients and their families through emotional , educational and financial support.  (626)763-4482 ? [place] DSS Where to apply for food stamps, Medicaid and utility assistance. 719-561-4644 ? RCATS: Transportation to medical appointments. 318-886-8562 ? Social Security Administration: May apply for disability if have a Stage IV cancer. 3132959957 3127407266 ? [employer], Disability and Transit Services: Assists with nutrition, care and transit needs. [place] Support Programs: @10RELATIVEDAYS @ > Cancer Support Group  2nd Tuesday of the month 1pm-2pm, Journey Room  > Creative Journey  3rd Tuesday of the month 1130am-1pm, Journey Room  > Look Good Feel Better  1st Wednesday of the month 10am-12 noon, Journey Room (Call [place] to register [phone])

## 2016-10-28 ENCOUNTER — Encounter (HOSPITAL_COMMUNITY): Payer: Medicare Other | Attending: Oncology

## 2016-10-28 DIAGNOSIS — M899 Disorder of bone, unspecified: Secondary | ICD-10-CM | POA: Insufficient documentation

## 2016-10-28 DIAGNOSIS — C61 Malignant neoplasm of prostate: Secondary | ICD-10-CM | POA: Insufficient documentation

## 2016-10-28 MED ORDER — PEGFILGRASTIM 6 MG/0.6ML ~~LOC~~ PSKT
PREFILLED_SYRINGE | SUBCUTANEOUS | Status: AC
  Filled 2016-10-28: qty 0.6

## 2016-10-28 NOTE — Progress Notes (Signed)
Lab work from 6 days ago show Ca++ of 8.3 with normal albumin.  Discussed with T. Kefalas, PA-C - instructed to repeat CMET today to recheck Ca++ level, OR have pt potentially receive Xgeva with next chemotherapy appt.  Pt decided he would wait to have repeat labs and  receive Xgeva injection with next chemotherapy appt.  Discharged ambulatory.

## 2016-11-09 ENCOUNTER — Other Ambulatory Visit (HOSPITAL_COMMUNITY): Payer: Self-pay | Admitting: *Deleted

## 2016-11-09 DIAGNOSIS — C61 Malignant neoplasm of prostate: Secondary | ICD-10-CM

## 2016-11-10 ENCOUNTER — Encounter (HOSPITAL_COMMUNITY): Payer: Medicare Other

## 2016-11-10 ENCOUNTER — Encounter (HOSPITAL_COMMUNITY): Payer: Self-pay

## 2016-11-10 ENCOUNTER — Encounter (HOSPITAL_BASED_OUTPATIENT_CLINIC_OR_DEPARTMENT_OTHER): Payer: Medicare Other

## 2016-11-10 VITALS — BP 104/71 | HR 66 | Temp 97.6°F | Resp 18 | Ht 72.0 in | Wt 163.0 lb

## 2016-11-10 DIAGNOSIS — Z5189 Encounter for other specified aftercare: Secondary | ICD-10-CM

## 2016-11-10 DIAGNOSIS — C7951 Secondary malignant neoplasm of bone: Secondary | ICD-10-CM | POA: Diagnosis not present

## 2016-11-10 DIAGNOSIS — Z5111 Encounter for antineoplastic chemotherapy: Secondary | ICD-10-CM

## 2016-11-10 DIAGNOSIS — C61 Malignant neoplasm of prostate: Secondary | ICD-10-CM | POA: Diagnosis not present

## 2016-11-10 DIAGNOSIS — M899 Disorder of bone, unspecified: Secondary | ICD-10-CM | POA: Diagnosis not present

## 2016-11-10 LAB — CBC WITH DIFFERENTIAL/PLATELET
Basophils Absolute: 0 10*3/uL (ref 0.0–0.1)
Basophils Relative: 0 %
Eosinophils Absolute: 0.1 10*3/uL (ref 0.0–0.7)
Eosinophils Relative: 1 %
HEMATOCRIT: 33.4 % — AB (ref 39.0–52.0)
Hemoglobin: 11 g/dL — ABNORMAL LOW (ref 13.0–17.0)
LYMPHS ABS: 1.9 10*3/uL (ref 0.7–4.0)
LYMPHS PCT: 25 %
MCH: 30.5 pg (ref 26.0–34.0)
MCHC: 32.9 g/dL (ref 30.0–36.0)
MCV: 92.5 fL (ref 78.0–100.0)
MONO ABS: 1 10*3/uL (ref 0.1–1.0)
MONOS PCT: 12 %
NEUTROS ABS: 4.8 10*3/uL (ref 1.7–7.7)
Neutrophils Relative %: 62 %
Platelets: 338 10*3/uL (ref 150–400)
RBC: 3.61 MIL/uL — ABNORMAL LOW (ref 4.22–5.81)
RDW: 16.4 % — AB (ref 11.5–15.5)
WBC: 7.7 10*3/uL (ref 4.0–10.5)

## 2016-11-10 LAB — COMPREHENSIVE METABOLIC PANEL
ALT: 16 U/L — ABNORMAL LOW (ref 17–63)
ANION GAP: 7 (ref 5–15)
AST: 25 U/L (ref 15–41)
Albumin: 4 g/dL (ref 3.5–5.0)
Alkaline Phosphatase: 61 U/L (ref 38–126)
BILIRUBIN TOTAL: 0.5 mg/dL (ref 0.3–1.2)
BUN: 16 mg/dL (ref 6–20)
CO2: 23 mmol/L (ref 22–32)
Calcium: 8.8 mg/dL — ABNORMAL LOW (ref 8.9–10.3)
Chloride: 106 mmol/L (ref 101–111)
Creatinine, Ser: 1.86 mg/dL — ABNORMAL HIGH (ref 0.61–1.24)
GFR calc Af Amer: 41 mL/min — ABNORMAL LOW (ref >60)
GFR, EST NON AFRICAN AMERICAN: 35 mL/min — AB (ref >60)
Glucose, Bld: 90 mg/dL (ref 65–99)
POTASSIUM: 4.4 mmol/L (ref 3.5–5.1)
Sodium: 136 mmol/L (ref 135–145)
TOTAL PROTEIN: 7.7 g/dL (ref 6.5–8.1)

## 2016-11-10 LAB — PSA: PROSTATIC SPECIFIC ANTIGEN: 1.13 ng/mL (ref 0.00–4.00)

## 2016-11-10 MED ORDER — DIPHENHYDRAMINE HCL 50 MG/ML IJ SOLN
25.0000 mg | Freq: Once | INTRAMUSCULAR | Status: AC
  Administered 2016-11-10: 25 mg via INTRAVENOUS
  Filled 2016-11-10: qty 1

## 2016-11-10 MED ORDER — DEXAMETHASONE SODIUM PHOSPHATE 10 MG/ML IJ SOLN
10.0000 mg | Freq: Once | INTRAMUSCULAR | Status: AC
  Administered 2016-11-10: 10 mg via INTRAVENOUS
  Filled 2016-11-10: qty 1

## 2016-11-10 MED ORDER — DEXTROSE 5 % IV SOLN
20.0000 mg/m2 | Freq: Once | INTRAVENOUS | Status: AC
  Administered 2016-11-10: 41 mg via INTRAVENOUS
  Filled 2016-11-10: qty 4.1

## 2016-11-10 MED ORDER — FAMOTIDINE IN NACL 20-0.9 MG/50ML-% IV SOLN
20.0000 mg | Freq: Once | INTRAVENOUS | Status: AC
  Administered 2016-11-10: 20 mg via INTRAVENOUS
  Filled 2016-11-10: qty 50

## 2016-11-10 MED ORDER — SODIUM CHLORIDE 0.9 % IV SOLN
Freq: Once | INTRAVENOUS | Status: AC
  Administered 2016-11-10: 10:00:00 via INTRAVENOUS

## 2016-11-10 MED ORDER — HEPARIN SOD (PORK) LOCK FLUSH 100 UNIT/ML IV SOLN
500.0000 [IU] | Freq: Once | INTRAVENOUS | Status: AC | PRN
  Administered 2016-11-10: 500 [IU]

## 2016-11-10 MED ORDER — PEGFILGRASTIM 6 MG/0.6ML ~~LOC~~ PSKT
6.0000 mg | PREFILLED_SYRINGE | Freq: Once | SUBCUTANEOUS | Status: AC
  Administered 2016-11-10: 6 mg via SUBCUTANEOUS
  Filled 2016-11-10: qty 0.6

## 2016-11-10 NOTE — Progress Notes (Signed)
Will hold Xgeva today per T. Kefalas, PA-C for calcium 8.8 and normal albumin.  Tolerated infusion w/o adverse reaction.  Alert, in no distress.  VSS.  Neulasta OnPro in place LUE and engaged with green light flashing. Discharged ambulatory.

## 2016-11-17 ENCOUNTER — Other Ambulatory Visit (HOSPITAL_COMMUNITY): Payer: Self-pay | Admitting: *Deleted

## 2016-11-17 DIAGNOSIS — C61 Malignant neoplasm of prostate: Secondary | ICD-10-CM

## 2016-11-18 ENCOUNTER — Encounter (HOSPITAL_COMMUNITY): Payer: Self-pay

## 2016-11-18 ENCOUNTER — Encounter (HOSPITAL_COMMUNITY): Payer: Self-pay | Admitting: Adult Health

## 2016-11-18 ENCOUNTER — Encounter (HOSPITAL_BASED_OUTPATIENT_CLINIC_OR_DEPARTMENT_OTHER): Payer: Medicare Other

## 2016-11-18 ENCOUNTER — Other Ambulatory Visit (HOSPITAL_COMMUNITY): Payer: Self-pay | Admitting: Adult Health

## 2016-11-18 ENCOUNTER — Encounter (HOSPITAL_COMMUNITY): Payer: Medicare Other

## 2016-11-18 VITALS — BP 113/67 | HR 68 | Temp 97.3°F | Resp 18

## 2016-11-18 DIAGNOSIS — E538 Deficiency of other specified B group vitamins: Secondary | ICD-10-CM

## 2016-11-18 DIAGNOSIS — M899 Disorder of bone, unspecified: Secondary | ICD-10-CM | POA: Diagnosis not present

## 2016-11-18 DIAGNOSIS — C61 Malignant neoplasm of prostate: Secondary | ICD-10-CM

## 2016-11-18 DIAGNOSIS — C7951 Secondary malignant neoplasm of bone: Secondary | ICD-10-CM | POA: Diagnosis not present

## 2016-11-18 DIAGNOSIS — G893 Neoplasm related pain (acute) (chronic): Secondary | ICD-10-CM

## 2016-11-18 LAB — COMPREHENSIVE METABOLIC PANEL
ALBUMIN: 3.9 g/dL (ref 3.5–5.0)
ALT: 20 U/L (ref 17–63)
ANION GAP: 7 (ref 5–15)
AST: 27 U/L (ref 15–41)
Alkaline Phosphatase: 92 U/L (ref 38–126)
BILIRUBIN TOTAL: 0.4 mg/dL (ref 0.3–1.2)
BUN: 13 mg/dL (ref 6–20)
CO2: 24 mmol/L (ref 22–32)
Calcium: 9 mg/dL (ref 8.9–10.3)
Chloride: 104 mmol/L (ref 101–111)
Creatinine, Ser: 1.62 mg/dL — ABNORMAL HIGH (ref 0.61–1.24)
GFR calc Af Amer: 48 mL/min — ABNORMAL LOW (ref >60)
GFR calc non Af Amer: 42 mL/min — ABNORMAL LOW (ref >60)
Glucose, Bld: 104 mg/dL — ABNORMAL HIGH (ref 65–99)
POTASSIUM: 4.6 mmol/L (ref 3.5–5.1)
Sodium: 135 mmol/L (ref 135–145)
TOTAL PROTEIN: 7.7 g/dL (ref 6.5–8.1)

## 2016-11-18 LAB — CBC WITH DIFFERENTIAL/PLATELET
BASOS PCT: 0 %
Basophils Absolute: 0 10*3/uL (ref 0.0–0.1)
EOS ABS: 0.1 10*3/uL (ref 0.0–0.7)
EOS PCT: 0 %
HEMATOCRIT: 32.6 % — AB (ref 39.0–52.0)
Hemoglobin: 10.8 g/dL — ABNORMAL LOW (ref 13.0–17.0)
Lymphocytes Relative: 10 %
Lymphs Abs: 1.5 10*3/uL (ref 0.7–4.0)
MCH: 30.6 pg (ref 26.0–34.0)
MCHC: 33.1 g/dL (ref 30.0–36.0)
MCV: 92.4 fL (ref 78.0–100.0)
MONO ABS: 0.5 10*3/uL (ref 0.1–1.0)
MONOS PCT: 3 %
NEUTROS ABS: 13.6 10*3/uL — AB (ref 1.7–7.7)
Neutrophils Relative %: 87 %
Platelets: 292 10*3/uL (ref 150–400)
RBC: 3.53 MIL/uL — ABNORMAL LOW (ref 4.22–5.81)
RDW: 16.2 % — AB (ref 11.5–15.5)
WBC: 15.7 10*3/uL — ABNORMAL HIGH (ref 4.0–10.5)

## 2016-11-18 LAB — PSA: PROSTATIC SPECIFIC ANTIGEN: 0.87 ng/mL (ref 0.00–4.00)

## 2016-11-18 MED ORDER — CYANOCOBALAMIN 1000 MCG/ML IJ SOLN
1000.0000 ug | Freq: Once | INTRAMUSCULAR | Status: AC
  Administered 2016-11-18: 1000 ug via INTRAMUSCULAR

## 2016-11-18 MED ORDER — OXYCODONE HCL 15 MG PO TABS: 15.0000 mg | ORAL_TABLET | ORAL | 0 refills | Status: DC | PRN

## 2016-11-18 MED ORDER — OXYCODONE HCL 15 MG PO TABS
15.0000 mg | ORAL_TABLET | ORAL | 0 refills | Status: DC | PRN
Start: 2016-11-18 — End: 2016-12-17

## 2016-11-18 MED ORDER — DENOSUMAB 120 MG/1.7ML ~~LOC~~ SOLN
120.0000 mg | Freq: Once | SUBCUTANEOUS | Status: AC
  Administered 2016-11-18: 120 mg via SUBCUTANEOUS
  Filled 2016-11-18: qty 1.7

## 2016-11-18 MED ORDER — CYANOCOBALAMIN 1000 MCG/ML IJ SOLN
INTRAMUSCULAR | Status: AC
  Filled 2016-11-18: qty 1

## 2016-11-18 NOTE — Progress Notes (Signed)
Jeff [name] tolerated Xgeva and Vit B12 injection well without complaints or incident. Pt denies any tooth,jaw or leg pain and no dental visits or issues at this time. Pt continues to take his PO Calcium as prescribed. VSS Pt discharged self ambulatory in satisfactory condition

## 2016-11-18 NOTE — Progress Notes (Signed)
Labs reviewed prior to administering Xgeva injection. Calcium 9.0.

## 2016-11-18 NOTE — Progress Notes (Signed)
Patient requesting refill of Oxycodone.   NCCSRS reviewed and refill is appropriate. Paper prescription given to [place], Therapist, sports.   Mike Craze, NP [place] [phone]

## 2016-11-18 NOTE — Patient Instructions (Signed)
[place] at [facility] Discharge Instructions  RECOMMENDATIONS MADE BY THE CONSULTANT AND ANY TEST RESULTS WILL BE SENT TO YOUR REFERRING PHYSICIAN.  Received Vit B12 and Xgeva injections today. Follow-up as scheduled. Call clinic for any questions or concerns  Thank you for choosing [place] at [facility] to provide your oncology and hematology care.  To afford each patient quality time with our provider, please arrive at least 15 minutes before your scheduled appointment time.    If you have a lab appointment with the [facility] please come in thru the  Main Entrance and check in at the main information desk  You need to re-schedule your appointment should you arrive 10 or more minutes late.  We strive to give you quality time with our providers, and arriving late affects you and other patients whose appointments are after yours.  Also, if you no show three or more times for appointments you may be dismissed from the clinic at the providers discretion.     Again, thank you for choosing [facility].  Our hope is that these requests will decrease the amount of time that you wait before being seen by our physicians.       _____________________________________________________________  Should you have questions after your visit to [facility], please contact our office at [phone] between the hours of 8:30 a.m. and 4:30 p.m.  Voicemails left after 4:30 p.m. will not be returned until the following business day.  For prescription refill requests, have your pharmacy contact our office.       Resources For Cancer Patients and their Caregivers ? American Cancer Society: Can assist with transportation, wigs, general needs, runs Look Good Feel Better.        309-161-3438 ? Cancer Care: Provides financial assistance, online support groups, medication/co-pay assistance.  1-800-813-HOPE 787-531-4375) ? [place] Assists [place] cancer patients and their families through emotional , educational and financial support.  516 572 1160 ? [place] DSS Where to apply for food stamps, Medicaid and utility assistance. (678)454-9232 ? RCATS: Transportation to medical appointments. 772-192-2549 ? Social Security Administration: May apply for disability if have a Stage IV cancer. 636-824-7695 9708494599 ? [employer], Disability and Transit Services: Assists with nutrition, care and transit needs. [place] Support Programs: @10RELATIVEDAYS @ > Cancer Support Group  2nd Tuesday of the month 1pm-2pm, Journey Room  > Creative Journey  3rd Tuesday of the month 1130am-1pm, Journey Room  > Look Good Feel Better  1st Wednesday of the month 10am-12 noon, Journey Room (Call [place] to register [phone])

## 2016-12-16 ENCOUNTER — Other Ambulatory Visit (HOSPITAL_COMMUNITY): Payer: Self-pay | Admitting: *Deleted

## 2016-12-16 DIAGNOSIS — C61 Malignant neoplasm of prostate: Secondary | ICD-10-CM

## 2016-12-17 ENCOUNTER — Encounter (HOSPITAL_COMMUNITY): Payer: Medicare Other | Attending: Oncology

## 2016-12-17 ENCOUNTER — Encounter (HOSPITAL_COMMUNITY): Payer: Self-pay

## 2016-12-17 ENCOUNTER — Encounter (HOSPITAL_BASED_OUTPATIENT_CLINIC_OR_DEPARTMENT_OTHER): Payer: Medicare Other

## 2016-12-17 VITALS — BP 112/76 | HR 64 | Temp 97.3°F | Resp 18

## 2016-12-17 DIAGNOSIS — E538 Deficiency of other specified B group vitamins: Secondary | ICD-10-CM

## 2016-12-17 DIAGNOSIS — G893 Neoplasm related pain (acute) (chronic): Secondary | ICD-10-CM

## 2016-12-17 DIAGNOSIS — C61 Malignant neoplasm of prostate: Secondary | ICD-10-CM

## 2016-12-17 DIAGNOSIS — C7951 Secondary malignant neoplasm of bone: Secondary | ICD-10-CM | POA: Diagnosis present

## 2016-12-17 DIAGNOSIS — M899 Disorder of bone, unspecified: Secondary | ICD-10-CM

## 2016-12-17 LAB — COMPREHENSIVE METABOLIC PANEL
ALK PHOS: 45 U/L (ref 38–126)
ALT: 18 U/L (ref 17–63)
ANION GAP: 6 (ref 5–15)
AST: 27 U/L (ref 15–41)
Albumin: 3.8 g/dL (ref 3.5–5.0)
BILIRUBIN TOTAL: 0.2 mg/dL — AB (ref 0.3–1.2)
BUN: 27 mg/dL — ABNORMAL HIGH (ref 6–20)
CALCIUM: 9 mg/dL (ref 8.9–10.3)
CO2: 23 mmol/L (ref 22–32)
CREATININE: 1.96 mg/dL — AB (ref 0.61–1.24)
Chloride: 106 mmol/L (ref 101–111)
GFR, EST AFRICAN AMERICAN: 38 mL/min — AB (ref >60)
GFR, EST NON AFRICAN AMERICAN: 33 mL/min — AB (ref >60)
Glucose, Bld: 96 mg/dL (ref 65–99)
Potassium: 5.1 mmol/L (ref 3.5–5.1)
Sodium: 135 mmol/L (ref 135–145)
TOTAL PROTEIN: 7.7 g/dL (ref 6.5–8.1)

## 2016-12-17 LAB — CBC WITH DIFFERENTIAL/PLATELET
BASOS ABS: 0 10*3/uL (ref 0.0–0.1)
BASOS PCT: 0 %
EOS ABS: 0.1 10*3/uL (ref 0.0–0.7)
Eosinophils Relative: 3 %
HEMATOCRIT: 33 % — AB (ref 39.0–52.0)
HEMOGLOBIN: 11 g/dL — AB (ref 13.0–17.0)
Lymphocytes Relative: 32 %
Lymphs Abs: 1.3 10*3/uL (ref 0.7–4.0)
MCH: 30.6 pg (ref 26.0–34.0)
MCHC: 33.3 g/dL (ref 30.0–36.0)
MCV: 91.9 fL (ref 78.0–100.0)
MONOS PCT: 13 %
Monocytes Absolute: 0.5 10*3/uL (ref 0.1–1.0)
NEUTROS ABS: 2.1 10*3/uL (ref 1.7–7.7)
NEUTROS PCT: 52 %
Platelets: 269 10*3/uL (ref 150–400)
RBC: 3.59 MIL/uL — ABNORMAL LOW (ref 4.22–5.81)
RDW: 15.6 % — ABNORMAL HIGH (ref 11.5–15.5)
WBC: 4 10*3/uL (ref 4.0–10.5)

## 2016-12-17 LAB — PSA: Prostatic Specific Antigen: 0.82 ng/mL (ref 0.00–4.00)

## 2016-12-17 MED ORDER — OXYCODONE HCL 15 MG PO TABS: 15.0000 mg | ORAL_TABLET | ORAL | 0 refills | Status: DC | PRN

## 2016-12-17 MED ORDER — DENOSUMAB 120 MG/1.7ML ~~LOC~~ SOLN
120.0000 mg | Freq: Once | SUBCUTANEOUS | Status: AC
Start: 2016-12-17 — End: 2016-12-17
  Administered 2016-12-17: 120 mg via SUBCUTANEOUS
  Filled 2016-12-17: qty 1.7

## 2016-12-17 MED ORDER — CYANOCOBALAMIN 1000 MCG/ML IJ SOLN
1000.0000 ug | Freq: Once | INTRAMUSCULAR | Status: AC
  Administered 2016-12-17: 1000 ug via INTRAMUSCULAR
  Filled 2016-12-17: qty 1

## 2016-12-17 NOTE — Progress Notes (Signed)
Reviewed labs and serum creatinine with Dr. Talbert Cage.  Ok to give [place] today and instruct the patient to hydrate.  Patient denied jaw and leg pains.  No dental visits scheduled due to no teeth per patient.  Stated he was taking his calcium as directed by oncologist with no problems.    Patient tolerated Xgeva and Vitamin B12 shot without difficulty.  Sites clean and dry with no swelling noted at site.  Band aids applied to sites. VSS with discharge.  Ambulatory with discharge.

## 2016-12-17 NOTE — Patient Instructions (Signed)
[place] at [facility]  Discharge Instructions:  Received [place] See and Vitamin B12 today.  Keep scheduled appointments and call for any questions or concerns.   _______________________________________________________________  Thank you for choosing [place] at [facility] to provide your oncology and hematology care.  To afford each patient quality time with our providers, please arrive at least 15 minutes before your scheduled appointment.  You need to re-schedule your appointment if you arrive 10 or more minutes late.  We strive to give you quality time with our providers, and arriving late affects you and other patients whose appointments are after yours.  Also, if you no show three or more times for appointments you may be dismissed from the clinic.  Again, thank you for choosing [place] at [place] hope is that these requests will allow you access to exceptional care and in a timely manner. _______________________________________________________________  If you have questions after your visit, please contact our office at [phone] between the hours of 8:30 a.m. and 5:00 p.m. Voicemails left after 4:30 p.m. will not be returned until the following business day. _______________________________________________________________  For prescription refill requests, have your pharmacy contact our office. _______________________________________________________________  Recommendations made by the consultant and any test results will be sent to your referring physician. _______________________________________________________________

## 2016-12-20 ENCOUNTER — Ambulatory Visit (HOSPITAL_COMMUNITY)
Admission: RE | Admit: 2016-12-20 | Discharge: 2016-12-20 | Disposition: A | Discharge status: Home / Self Care | Payer: Medicare Other | Source: Ambulatory Visit | Attending: Adult Health | Admitting: Adult Health

## 2016-12-20 DIAGNOSIS — J439 Emphysema, unspecified: Secondary | ICD-10-CM | POA: Insufficient documentation

## 2016-12-20 DIAGNOSIS — I700 Atherosclerosis of aorta: Secondary | ICD-10-CM | POA: Diagnosis not present

## 2016-12-20 DIAGNOSIS — C7951 Secondary malignant neoplasm of bone: Secondary | ICD-10-CM | POA: Insufficient documentation

## 2016-12-20 DIAGNOSIS — C61 Malignant neoplasm of prostate: Secondary | ICD-10-CM

## 2016-12-20 DIAGNOSIS — I2510 Atherosclerotic heart disease of native coronary artery without angina pectoris: Secondary | ICD-10-CM | POA: Insufficient documentation

## 2016-12-20 MED ORDER — IOPAMIDOL (ISOVUE-300) INJECTION 61%
100.0000 mL | Freq: Once | INTRAVENOUS | Status: AC | PRN
  Administered 2016-12-20: 80 mL via INTRAVENOUS

## 2016-12-22 ENCOUNTER — Ambulatory Visit (HOSPITAL_COMMUNITY): Payer: Medicare Other | Admitting: Adult Health

## 2016-12-29 NOTE — Progress Notes (Deleted)
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    BRIEF ONCOLOGIC HISTORY:    Prostate cancer metastatic to multiple sites ([place])   01/26/2014 Tumor Marker    PSA > 5000      01/28/2014 Initial Biopsy    Metastatic adenocarcinoma of prostate      02/05/2014 Surgery    Bilateral orchiectomy by Dr. Junious Silk      02/26/2014 Tumor Marker    PSA = 399.5      03/14/2014 Tumor Marker    PSA= 89.51      03/14/2014 - 06/26/2014 Chemotherapy    Docetaxel 75 mg/kg every 21 days x 6 cycles      04/24/2014 Tumor Marker    PSA- 19.89      07/23/2014 Procedure    Nephrostomy tube removed by IR, Dr. Geroge Baseman      07/24/2014 Tumor Marker    PSA= 6.15      10/16/2014 Tumor Marker    PSA: 3.54       01/08/2015 Tumor Marker    PSA: 2.13       01/17/2015 Imaging    CT CAP-  Massive pelvic and retroperitoneal lymphadenopathy noted on the prior study has nearly completely resolved, now with only a small amount of residual amorphous soft tissue predominantly around the the infrarenal abdominal aorta.      01/17/2015 Imaging    Bone scan- Widespread osseous metastatic disease with multiple foci of increased activity throughout the skeleton, corresponding with the findings on the prior CT from October, 2015.      04/11/2015 Tumor Marker    PSA: 1.87       07/21/2015 Imaging    Bone scan- Bony metastatic disease again noted at multiple sites, stable from prior study. No progression of bony metastatic disease is demonstrable on this study.      07/25/2015 Imaging    CT CAP- Stable matted soft tissue density in the retroperitoneum and extraperitoneal pelvis  consistent with treated disease. No recurrent lymphadenopathy.  No pulmonary metastatic disease. Diffuse stable sclerotic metastatic bone disease.      01/14/2016 Imaging    Bone scan-  Multiple sites of abnormal increased tracer localization involving BILATERAL ribs, T11, T12, questionably RIGHT scapula and LEFT iliac bone suspicious for osseous metastases.      01/23/2016 Imaging    CT CAP- 1. Similar widespread osseous metastasis. 2. Similar soft tissue thickening within the retroperitoneum of the abdomen. Improved left pelvic side wall soft tissue thickening. These are consistent with sites of treated disease. No well-defined adenopathy. 3. No new sites of disease.      05/03/2016 -  Chemotherapy    Jevtana every 21 days       09/06/2016 Imaging    Restaging CT C/A/P: IMPRESSION: 1. Similar appearance of widespread sclerotic bone metastases. 2. No change in soft tissue thickening within the retroperitoneum and left pelvic sidewall. No well defined adenopathy identified. 3. No new sites of disease 4. Aortic Atherosclerosis (ICD10-I70.0). Coronary artery calcifications noted. 5. 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 6. Stable 9 mm pancreatic cystic lesion. Favor pseudocyst. Indolent neoplasm may look similar.  Attention on follow-up imaging.      09/06/2016 Imaging    Bone Scan: IMPRESSION: In this patient with known diffuse sclerotic metastatic disease, bone scan does not reveal progression of radiotracer uptake. Several of the areas of previously demonstrated radiotracer uptake appear less prominent.  Change in appearance of radiotracer uptake involving the mandible now more notable on the right and previously more notable on the left may reflect result of dental disease rather than metastatic disease.         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 Completed Docetaxel x 6 cycles on 06/26/14 with great response.  Now slowly rising PSA (doubling time ~ 9 months) resulting in restarting systemic treatment with Jevtana beginning on 05/03/16    INTERVAL HISTORY:  Jeff [name] returns for routine follow-up and consideration for next cycle Jevtana.    Overall, he tells me that he has been feeling very well. His appetite is 100%; energy levels 75%.   He is due for next cycle Jevtana today. He continues to get Niger monthly as well.  Overall, he has no complaints today.  Denies any fever or chills.  Denies N&V, diarrhea, constipation, dysuria, or hematuria. No blood in his stools or dark/tarry stools.    We increased his pain medication dosage at his last visit. States that his pain is well-controlled. Generally takes 1-2 oxycodone 15 mg daily.  Most of his pain is back/bone pain.  He tells me that he has about 4-5 days worth of pain pills left; I will check to see if he can receive a prescription refill today.   He tells me that his "boss man" Jeff [name] would like to speak to me today. Jeff [name] gave me permission to talk with Jeff [name] about his condition.  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    Jeff [name] wants to make sure that Jeff [name] or [name] are "on his chart" for [place] to be able to speak to about his health.  I will verify this today with our front office staff.   He continues to use tobacco, but has been working on cutting back.  He is now smoking more of "the little cigars that come 2-3 in a pack."  This generally lasts him several days.    Overall, he feels very well and is ready for his next cycle of Jevtana.      REVIEW  OF SYSTEMS:  Review of Systems  Constitutional: Negative.  Negative for chills, fatigue and fever.  HENT:  Negative.  Negative for lump/mass and nosebleeds.   Eyes: Negative.   Respiratory: Negative.  Negative for cough and shortness of breath.   Cardiovascular: Negative.  Negative for chest pain and leg swelling.  Gastrointestinal: Negative.  Negative for abdominal pain, blood in stool, constipation, diarrhea, nausea and vomiting.  Endocrine: Negative.   Genitourinary: Negative.  Negative for dysuria and hematuria.   Musculoskeletal: Positive for back pain. Negative for arthralgias.  Skin: Negative.  Negative for rash.  Neurological: Negative.  Negative for dizziness and headaches.  Hematological: Negative.  Negative for adenopathy. Does not bruise/bleed easily.  Psychiatric/Behavioral: Negative.  Negative for depression  and sleep disturbance. The patient is not nervous/anxious.      PAST MEDICAL/SURGICAL HISTORY:  Past Medical History:  Diagnosis Date  . Acute renal failure ([place]) 01/26/2014  . Alcohol abuse   . Anemia of chronic disease 01/26/2014  . Bilateral hydronephrosis 01/26/2014  . History of cocaine abuse 01/26/2014  . Homelessness 01/31/2014  . Prostate cancer metastatic to multiple sites ([place]) 01/30/2014  . Sepsis ([place])   . UTI (lower urinary tract infection)    Past Surgical History:  Procedure Laterality Date  . ORCHIECTOMY Bilateral 02/05/2014   Procedure: BILATERAL ORCHIECTOMY;  Surgeon: Festus Aloe, MD;  Location: [facility];  Service: Urology;  Laterality: Bilateral;  . PERCUTANEOUS NEPHROSTOMY Bilateral    IR Dr. Junious Silk changed on 04/22/2014  . PORTACATH PLACEMENT Right 03/12/14     SOCIAL HISTORY:  Social History   Social History  . Marital status: Single    Spouse name: N/A  . Number of children: 1  . Years of education: 9th   Occupational History  . disabilty [employer]   Social History Main Topics  . Smoking status: Current Every Day  Smoker    Packs/day: 0.25    Years: 55.00    Types: Cigarettes  . Smokeless tobacco: Never Used  . Alcohol use No     Comment: none for at least 7 -8 years  . Drug use: Yes    Types: Cocaine     Comment: last used Cocaine 01/25/14  . Sexual activity: Yes   Other Topics Concern  . Not on file   Social History Narrative   Lives at [employer] 9th grade   1 son, lives in [place], [place]   Can read and write in native language             FAMILY HISTORY:  Family History  Problem Relation Age of Onset  . Cancer Brother 48    CURRENT MEDICATIONS:  Outpatient Encounter Prescriptions as of 12/30/2016  Medication Sig Note  . Cabazitaxel (JEVTANA IV) Inject into the vein. Every 3 weeks   . calcium-vitamin D (OSCAL WITH D) 500-200 MG-UNIT tablet Take 2 tablets by mouth daily with breakfast. (Patient not taking: Reported on 10/20/2016)   . lidocaine-prilocaine (EMLA) cream Apply a quarter size amount to port site 1 hour prior to chemo. Do not rub in. Cover with plastic wrap. 07/24/2014: Only takes when getting port flushed  . metoCLOPramide (REGLAN) 5 MG tablet The day after chemo take 1 tab four times a day x 2 days. Then may take 1 tab four times a day if needed for nausea/vomiting. 07/24/2014: Just takes as needed  . ondansetron (ZOFRAN) 8 MG tablet Take 1 tablet (8 mg total) by mouth every 8 (eight) hours as needed for nausea or vomiting.   [name] Kitchen oxyCODONE (ROXICODONE) 15 MG immediate release tablet Take 1 tablet (15 mg total) by mouth every 4 (four) hours as needed for pain.   . predniSONE (DELTASONE) 10 MG tablet Take 1 tablet (10 mg total) by mouth daily with breakfast.   . prochlorperazine (COMPAZINE) 10 MG tablet The day after chemo take 1 tab four times a day x 2 days. Then may take 1 tab four times a day if needed for nausea/vomiting. 07/24/2014: Just takes as needed   Facility-Administered Encounter Medications as of 12/30/2016  Medication  . cyanocobalamin ((VITAMIN  B-12)) injection 1,000 mcg  . denosumab (XGEVA) injection 120 mg    ALLERGIES:  No Known Allergies  PHYSICAL EXAM:  ECOG Performance status: 1 - Symptomatic, but independent.      Physical Exam  Constitutional: He is oriented to person, place, and time and well-developed, well-nourished, and in no distress.  HENT:  Head: Normocephalic.  Mouth/Throat: Oropharynx is clear and moist. No oropharyngeal exudate.  Eyes: Pupils are equal, round, and reactive to light. Conjunctivae are normal. No scleral icterus.  Neck: Normal range of motion. Neck supple.  Cardiovascular: Normal rate, regular rhythm and normal heart sounds.   Pulmonary/Chest: Effort normal and breath sounds normal. No respiratory distress.    Abdominal: Soft. Bowel sounds are normal. There is no tenderness.  Musculoskeletal: Normal range of motion. He exhibits no edema.  Lymphadenopathy:    He has no cervical adenopathy.       Right: No supraclavicular adenopathy present.       Left: No supraclavicular adenopathy present.  Neurological: He is alert and oriented to person, place, and time. No cranial nerve deficit. Gait normal.  Skin: Skin is warm and dry. No rash noted.  Psychiatric: Mood, memory, affect and judgment normal.  Nursing note and vitals reviewed.    LABORATORY DATA:  I have reviewed the labs as listed.  CBC    Component Value Date/Time   WBC 4.0 12/17/2016 0805   RBC 3.59 (L) 12/17/2016 0805   HGB 11.0 (L) 12/17/2016 0805   HCT 33.0 (L) 12/17/2016 0805   PLT 269 12/17/2016 0805   MCV 91.9 12/17/2016 0805   MCH 30.6 12/17/2016 0805   MCHC 33.3 12/17/2016 0805   RDW 15.6 (H) 12/17/2016 0805   LYMPHSABS 1.3 12/17/2016 0805   MONOABS 0.5 12/17/2016 0805   EOSABS 0.1 12/17/2016 0805   BASOSABS 0.0 12/17/2016 0805   CMP Latest Ref Rng & Units 12/17/2016 11/18/2016 11/10/2016  Glucose 65 - 99 mg/dL 96 104(H) 90  BUN 6 - 20 mg/dL 27(H) 13 16  Creatinine 0.61 - 1.24 mg/dL 1.96(H) 1.62(H) 1.86(H)    Sodium 135 - 145 mmol/L 135 135 136  Potassium 3.5 - 5.1 mmol/L 5.1 4.6 4.4  Chloride 101 - 111 mmol/L 106 104 106  CO2 22 - 32 mmol/L 23 24 23   Calcium 8.9 - 10.3 mg/dL 9.0 9.0 8.8(L)  Total Protein 6.5 - 8.1 g/dL 7.7 7.7 7.7  Total Bilirubin 0.3 - 1.2 mg/dL 0.2(L) 0.4 0.5  Alkaline Phos 38 - 126 U/L 45 92 61  AST 15 - 41 U/L 27 27 25   ALT 17 - 63 U/L 18 20 16(L)       PENDING LABS:    DIAGNOSTIC IMAGING:  Last CT chest/abd/pelvis: 09/06/16      Last Bone scan: 09/06/16       PATHOLOGY:  (R) lower right pelvis biopsy: 01/28/14       ASSESSMENT & PLAN:   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  -Clinically, he is tolerating Jevtana very well; Previous PSA values since starting Jevtana 3.96, 3.92, & 3.42.  PSA has been appropriately trending down, which supports good clinical response to therapy. Most recent PSA 0.94 today, which is excellent.  Last restaging bone scan & CT chest/abd/pelvis on 09/06/16 overall showed stability. -Labs reviewed and are adequate for next cycle Jevtana today. -He will be due for restaging CT chest/abd/pelvis and skeletal survey in 11/2016; orders placed today.  Ordered skeletal survey in lieu of bone scan as it is difficult to ascertain response to treatment with known bone mets with bone scan.  -  Return to cancer center every 3 weeks for Jevtana.  -Return to cancer center for follow-up in 11/2016 after restaging scans are completed to discuss subsequent treatment plans.   Bone mets:  -Continue monthly Xgeva injections to help reduce risk of skeletal related events.  Oncology Flowsheet 10/06/2016  denosumab (XGEVA) [place] 120 mg    Pain secondary to metastatic disease:  -Tolerating oxycodone 15 mg tabs well; taking appropriately (1-2 tabs/day). Pain well-controlled. Bowels are moving well.  -[place] Controlled Substance Reporting System reviewed. Paper prescription for  oxycodone refill given to patient today.      Bowel regimen:  -Stable per patient report.  -Bowels moving regularly without additional intervention; he understands the importance of adequate bowel regimen while taking opiates.   Tobacco use disorder:  -Commended his efforts to cut back on tobacco use. Encouraged him to continue the hard work on stopping all together.  We have discussed the use of patches and Chantix in the past, but he is "working on it" without these aids. Will continue to offer support for him going forward.      Dispo:  -Continue Jevtana every 3 weeks. Continue monthly Xgeva.  -Restaging CT chest/abd/pelvis and skeletal survey in 11/2016; orders placed today.  -Return to cancer center for follow-up in 11/2016 after restaging scans and for subsequent treatment planning.     All questions were answered to patient's stated satisfaction. Encouraged patient to call with any new concerns or questions before his next visit to the cancer center and we can certain see him sooner, if needed.     Plan of care discussed with Dr. Talbert Cage, who agrees with the above aforementioned.    Mike Craze, NP [place] [phone]

## 2016-12-30 ENCOUNTER — Ambulatory Visit (HOSPITAL_COMMUNITY): Payer: Medicare Other | Admitting: Adult Health

## 2017-01-13 ENCOUNTER — Other Ambulatory Visit (HOSPITAL_COMMUNITY): Payer: Self-pay | Admitting: *Deleted

## 2017-01-13 DIAGNOSIS — C61 Malignant neoplasm of prostate: Secondary | ICD-10-CM

## 2017-01-14 ENCOUNTER — Encounter (HOSPITAL_BASED_OUTPATIENT_CLINIC_OR_DEPARTMENT_OTHER): Payer: Medicare Other

## 2017-01-14 ENCOUNTER — Encounter (HOSPITAL_COMMUNITY): Payer: Medicare Other | Attending: Oncology

## 2017-01-14 ENCOUNTER — Encounter (HOSPITAL_COMMUNITY): Payer: Self-pay

## 2017-01-14 VITALS — BP 112/76 | HR 59 | Temp 97.7°F | Resp 18

## 2017-01-14 DIAGNOSIS — Z95828 Presence of other vascular implants and grafts: Secondary | ICD-10-CM

## 2017-01-14 DIAGNOSIS — C61 Malignant neoplasm of prostate: Secondary | ICD-10-CM | POA: Insufficient documentation

## 2017-01-14 DIAGNOSIS — C7951 Secondary malignant neoplasm of bone: Secondary | ICD-10-CM | POA: Diagnosis present

## 2017-01-14 DIAGNOSIS — E538 Deficiency of other specified B group vitamins: Secondary | ICD-10-CM | POA: Diagnosis not present

## 2017-01-14 DIAGNOSIS — G893 Neoplasm related pain (acute) (chronic): Secondary | ICD-10-CM

## 2017-01-14 DIAGNOSIS — M899 Disorder of bone, unspecified: Secondary | ICD-10-CM

## 2017-01-14 LAB — COMPREHENSIVE METABOLIC PANEL
ALBUMIN: 3.8 g/dL (ref 3.5–5.0)
ALT: 16 U/L — AB (ref 17–63)
AST: 25 U/L (ref 15–41)
Alkaline Phosphatase: 44 U/L (ref 38–126)
Anion gap: 7 (ref 5–15)
BILIRUBIN TOTAL: 0.4 mg/dL (ref 0.3–1.2)
BUN: 20 mg/dL (ref 6–20)
CHLORIDE: 104 mmol/L (ref 101–111)
CO2: 25 mmol/L (ref 22–32)
Calcium: 9 mg/dL (ref 8.9–10.3)
Creatinine, Ser: 2.04 mg/dL — ABNORMAL HIGH (ref 0.61–1.24)
GFR calc Af Amer: 36 mL/min — ABNORMAL LOW (ref >60)
GFR, EST NON AFRICAN AMERICAN: 31 mL/min — AB (ref >60)
Glucose, Bld: 93 mg/dL (ref 65–99)
POTASSIUM: 4.4 mmol/L (ref 3.5–5.1)
Sodium: 136 mmol/L (ref 135–145)
Total Protein: 7.8 g/dL (ref 6.5–8.1)

## 2017-01-14 LAB — CBC WITH DIFFERENTIAL/PLATELET
BASOS ABS: 0 10*3/uL (ref 0.0–0.1)
BASOS PCT: 0 %
Eosinophils Absolute: 0.1 10*3/uL (ref 0.0–0.7)
Eosinophils Relative: 2 %
HEMATOCRIT: 33.4 % — AB (ref 39.0–52.0)
HEMOGLOBIN: 11.2 g/dL — AB (ref 13.0–17.0)
LYMPHS PCT: 37 %
Lymphs Abs: 1.5 10*3/uL (ref 0.7–4.0)
MCH: 30.5 pg (ref 26.0–34.0)
MCHC: 33.5 g/dL (ref 30.0–36.0)
MCV: 91 fL (ref 78.0–100.0)
Monocytes Absolute: 0.5 10*3/uL (ref 0.1–1.0)
Monocytes Relative: 11 %
NEUTROS ABS: 2 10*3/uL (ref 1.7–7.7)
NEUTROS PCT: 50 %
Platelets: 236 10*3/uL (ref 150–400)
RBC: 3.67 MIL/uL — AB (ref 4.22–5.81)
RDW: 15.1 % (ref 11.5–15.5)
WBC: 4 10*3/uL (ref 4.0–10.5)

## 2017-01-14 LAB — PSA: PROSTATIC SPECIFIC ANTIGEN: 0.89 ng/mL (ref 0.00–4.00)

## 2017-01-14 MED ORDER — CYANOCOBALAMIN 1000 MCG/ML IJ SOLN
INTRAMUSCULAR | Status: AC
  Filled 2017-01-14: qty 1

## 2017-01-14 MED ORDER — CYANOCOBALAMIN 1000 MCG/ML IJ SOLN
1000.0000 ug | Freq: Once | INTRAMUSCULAR | Status: AC
  Administered 2017-01-14: 1000 ug via INTRAMUSCULAR

## 2017-01-14 MED ORDER — OXYCODONE HCL 15 MG PO TABS: 15.0000 mg | ORAL_TABLET | ORAL | 0 refills | Status: DC | PRN

## 2017-01-14 MED ORDER — SODIUM CHLORIDE 0.9% FLUSH
10.0000 mL | INTRAVENOUS | Status: DC | PRN
  Administered 2017-01-14: 10 mL via INTRAVENOUS
  Filled 2017-01-14: qty 10

## 2017-01-14 MED ORDER — DENOSUMAB 120 MG/1.7ML ~~LOC~~ SOLN
120.0000 mg | Freq: Once | SUBCUTANEOUS | Status: AC
  Administered 2017-01-14: 120 mg via SUBCUTANEOUS
  Filled 2017-01-14: qty 1.7

## 2017-01-14 MED ORDER — HEPARIN SOD (PORK) LOCK FLUSH 100 UNIT/ML IV SOLN
500.0000 [IU] | Freq: Once | INTRAVENOUS | Status: AC
  Administered 2017-01-14: 500 [IU] via INTRAVENOUS

## 2017-01-14 NOTE — Patient Instructions (Signed)
[place] at [facility] Discharge Instructions  RECOMMENDATIONS MADE BY THE CONSULTANT AND ANY TEST RESULTS WILL BE SENT TO YOUR REFERRING PHYSICIAN.  Received Vit B12 and Xgeva injections today with portacath flushed as well. Follow-up as scheduled. Call clinic for any questions or concerns  Thank you for choosing [place] at [facility] to provide your oncology and hematology care.  To afford each patient quality time with our provider, please arrive at least 15 minutes before your scheduled appointment time.    If you have a lab appointment with the [facility] please come in thru the  Main Entrance and check in at the main information desk  You need to re-schedule your appointment should you arrive 10 or more minutes late.  We strive to give you quality time with our providers, and arriving late affects you and other patients whose appointments are after yours.  Also, if you no show three or more times for appointments you may be dismissed from the clinic at the providers discretion.     Again, thank you for choosing [facility].  Our hope is that these requests will decrease the amount of time that you wait before being seen by our physicians.       _____________________________________________________________  Should you have questions after your visit to [facility], please contact our office at [phone] between the hours of 8:30 a.m. and 4:30 p.m.  Voicemails left after 4:30 p.m. will not be returned until the following business day.  For prescription refill requests, have your pharmacy contact our office.       Resources For Cancer Patients and their Caregivers ? American Cancer Society: Can assist with transportation, wigs, general needs, runs Look Good Feel Better.        405-343-6455 ? Cancer Care: Provides financial assistance, online support groups, medication/co-pay assistance.   1-800-813-HOPE 228-448-6068) ? [place] Assists [place] cancer patients and their families through emotional , educational and financial support.  301-219-6210 ? [place] DSS Where to apply for food stamps, Medicaid and utility assistance. 212-598-7604 ? RCATS: Transportation to medical appointments. 678-083-2676 ? Social Security Administration: May apply for disability if have a Stage IV cancer. 208 318 7540 (505)503-4178 ? [employer], Disability and Transit Services: Assists with nutrition, care and transit needs. [place] Support Programs: @10RELATIVEDAYS @ > Cancer Support Group  2nd Tuesday of the month 1pm-2pm, Journey Room  > Creative Journey  3rd Tuesday of the month 1130am-1pm, Journey Room  > Look Good Feel Better  1st Wednesday of the month 10am-12 noon, Journey Room (Call [place] to register [phone])

## 2017-01-14 NOTE — Progress Notes (Signed)
Jeff [name] tolerated Vit B12 and Xgeva injections as well as portacath flush well without complaints or incident.Calcium 9 today and pt denies any tooth,jaw or leg pain and is not scheduled for any dental visits in the near future. Pt reports that he continues to take his Calcium as prescribed. Port accessed with 20 gauge needle with blood return noted then flushed with 10 ml NS and 5 ml Heparin easily per protocol then de-accessed. VSS Pt discharged self ambulatory in satisfactory condition

## 2017-01-26 MED ORDER — OCTREOTIDE ACETATE 30 MG IM KIT
PACK | INTRAMUSCULAR | Status: AC
  Filled 2017-01-26: qty 1

## 2017-02-10 ENCOUNTER — Other Ambulatory Visit (HOSPITAL_COMMUNITY): Payer: Self-pay | Admitting: *Deleted

## 2017-02-10 DIAGNOSIS — C61 Malignant neoplasm of prostate: Secondary | ICD-10-CM

## 2017-02-11 ENCOUNTER — Encounter (HOSPITAL_COMMUNITY): Payer: Self-pay

## 2017-02-11 ENCOUNTER — Encounter (HOSPITAL_COMMUNITY): Payer: Medicare Other

## 2017-02-11 ENCOUNTER — Encounter (HOSPITAL_COMMUNITY): Payer: Medicare Other | Attending: Adult Health | Admitting: Adult Health

## 2017-02-11 ENCOUNTER — Encounter (HOSPITAL_BASED_OUTPATIENT_CLINIC_OR_DEPARTMENT_OTHER): Payer: Medicare Other

## 2017-02-11 VITALS — BP 130/72 | HR 75 | Temp 98.2°F | Resp 18 | Wt 168.7 lb

## 2017-02-11 DIAGNOSIS — C61 Malignant neoplasm of prostate: Secondary | ICD-10-CM

## 2017-02-11 DIAGNOSIS — F17218 Nicotine dependence, cigarettes, with other nicotine-induced disorders: Secondary | ICD-10-CM | POA: Diagnosis not present

## 2017-02-11 DIAGNOSIS — E538 Deficiency of other specified B group vitamins: Secondary | ICD-10-CM | POA: Diagnosis not present

## 2017-02-11 DIAGNOSIS — Z23 Encounter for immunization: Secondary | ICD-10-CM | POA: Diagnosis present

## 2017-02-11 DIAGNOSIS — C7951 Secondary malignant neoplasm of bone: Secondary | ICD-10-CM | POA: Diagnosis not present

## 2017-02-11 DIAGNOSIS — M899 Disorder of bone, unspecified: Secondary | ICD-10-CM

## 2017-02-11 DIAGNOSIS — G893 Neoplasm related pain (acute) (chronic): Secondary | ICD-10-CM | POA: Diagnosis not present

## 2017-02-11 LAB — COMPREHENSIVE METABOLIC PANEL
ALBUMIN: 3.9 g/dL (ref 3.5–5.0)
ALK PHOS: 48 U/L (ref 38–126)
ALT: 16 U/L — AB (ref 17–63)
AST: 25 U/L (ref 15–41)
Anion gap: 7 (ref 5–15)
BUN: 22 mg/dL — ABNORMAL HIGH (ref 6–20)
CALCIUM: 9.1 mg/dL (ref 8.9–10.3)
CO2: 25 mmol/L (ref 22–32)
CREATININE: 2.05 mg/dL — AB (ref 0.61–1.24)
Chloride: 105 mmol/L (ref 101–111)
GFR calc Af Amer: 36 mL/min — ABNORMAL LOW (ref >60)
GFR calc non Af Amer: 31 mL/min — ABNORMAL LOW (ref >60)
GLUCOSE: 88 mg/dL (ref 65–99)
Potassium: 5.2 mmol/L — ABNORMAL HIGH (ref 3.5–5.1)
SODIUM: 137 mmol/L (ref 135–145)
Total Bilirubin: 0.3 mg/dL (ref 0.3–1.2)
Total Protein: 8 g/dL (ref 6.5–8.1)

## 2017-02-11 LAB — CBC WITH DIFFERENTIAL/PLATELET
BASOS ABS: 0 10*3/uL (ref 0.0–0.1)
BASOS PCT: 0 %
EOS ABS: 0.1 10*3/uL (ref 0.0–0.7)
Eosinophils Relative: 2 %
HCT: 36.4 % — ABNORMAL LOW (ref 39.0–52.0)
HEMOGLOBIN: 11.8 g/dL — AB (ref 13.0–17.0)
LYMPHS ABS: 2 10*3/uL (ref 0.7–4.0)
Lymphocytes Relative: 43 %
MCH: 29.9 pg (ref 26.0–34.0)
MCHC: 32.4 g/dL (ref 30.0–36.0)
MCV: 92.2 fL (ref 78.0–100.0)
Monocytes Absolute: 0.4 10*3/uL (ref 0.1–1.0)
Monocytes Relative: 8 %
NEUTROS PCT: 47 %
Neutro Abs: 2.2 10*3/uL (ref 1.7–7.7)
Platelets: 239 10*3/uL (ref 150–400)
RBC: 3.95 MIL/uL — AB (ref 4.22–5.81)
RDW: 14.6 % (ref 11.5–15.5)
WBC: 4.8 10*3/uL (ref 4.0–10.5)

## 2017-02-11 LAB — PSA: Prostatic Specific Antigen: 1.07 ng/mL (ref 0.00–4.00)

## 2017-02-11 MED ORDER — SODIUM CHLORIDE 0.9% FLUSH
10.0000 mL | INTRAVENOUS | Status: DC | PRN
  Filled 2017-02-11: qty 10

## 2017-02-11 MED ORDER — CYANOCOBALAMIN 1000 MCG/ML IJ SOLN
1000.0000 ug | Freq: Once | INTRAMUSCULAR | Status: AC
  Administered 2017-02-11: 1000 ug via INTRAMUSCULAR

## 2017-02-11 MED ORDER — DENOSUMAB 120 MG/1.7ML ~~LOC~~ SOLN
120.0000 mg | Freq: Once | SUBCUTANEOUS | Status: AC
  Administered 2017-02-11: 120 mg via SUBCUTANEOUS
  Filled 2017-02-11: qty 1.7

## 2017-02-11 MED ORDER — HEPARIN SOD (PORK) LOCK FLUSH 100 UNIT/ML IV SOLN: 500.0000 [IU] | Freq: Once | INTRAVENOUS | Status: DC

## 2017-02-11 MED ORDER — CYANOCOBALAMIN 1000 MCG/ML IJ SOLN
INTRAMUSCULAR | Status: AC
  Filled 2017-02-11: qty 1

## 2017-02-11 MED ORDER — INFLUENZA VAC SPLIT QUAD 0.5 ML IM SUSY
PREFILLED_SYRINGE | INTRAMUSCULAR | Status: AC
  Filled 2017-02-11: qty 0.5

## 2017-02-11 MED ORDER — OXYCODONE HCL 15 MG PO TABS: 15.0000 mg | ORAL_TABLET | ORAL | 0 refills | Status: DC | PRN

## 2017-02-11 MED ORDER — INFLUENZA VAC SPLIT QUAD 0.5 ML IM SUSY
0.5000 mL | PREFILLED_SYRINGE | Freq: Once | INTRAMUSCULAR | Status: AC
  Administered 2017-02-11: 0.5 mL via INTRAMUSCULAR

## 2017-02-11 MED ORDER — HEPARIN SOD (PORK) LOCK FLUSH 100 UNIT/ML IV SOLN
INTRAVENOUS | Status: AC
  Filled 2017-02-11: qty 5

## 2017-02-11 NOTE — Progress Notes (Signed)
Jeff [name] presents today for injection per MD orders. Fluarix administered IM in left deltoid. Administration without incident. Patient tolerated well.  Jeff [name] presents today for injection per MD orders. Xgeva 120 mg administered SQ in right lower abdomen. Administration without incident. Patient tolerated well.  Jeff [name] presents today for injection per MD orders. B12 1000 mcg administered IM in right deltoid. Administration without incident. Patient tolerated well.  Patient tolerated treatment without incidence. Patient discharged ambulatory and in stable condition from clinic. Patient to follow up as scheduled.

## 2017-02-11 NOTE — Patient Instructions (Signed)
[place] at [facility] Discharge Instructions  RECOMMENDATIONS MADE BY THE CONSULTANT AND ANY TEST RESULTS WILL BE SENT TO YOUR REFERRING PHYSICIAN.  You had your Xgeva injection today You had your B-12 injection today You also had your flu shot today Follow up on Monday as scheduled.  Thank you for choosing [place] at [facility] to provide your oncology and hematology care.  To afford each patient quality time with our provider, please arrive at least 15 minutes before your scheduled appointment time.    If you have a lab appointment with the [place] please come in thru the  Main Entrance and check in at the main information desk  You need to re-schedule your appointment should you arrive 10 or more minutes late.  We strive to give you quality time with our providers, and arriving late affects you and other patients whose appointments are after yours.  Also, if you no show three or more times for appointments you may be dismissed from the clinic at the providers discretion.     Again, thank you for choosing [facility].  Our hope is that these requests will decrease the amount of time that you wait before being seen by our physicians.       _____________________________________________________________  Should you have questions after your visit to [facility], please contact our office at [phone] between the hours of 8:30 a.m. and 4:30 p.m.  Voicemails left after 4:30 p.m. will not be returned until the following business day.  For prescription refill requests, have your pharmacy contact our office.       Resources For Cancer Patients and their Caregivers ? American Cancer Society: Can assist with transportation, wigs, general needs, runs Look Good Feel Better.        (548)165-7583 ? Cancer Care: Provides financial assistance, online support groups, medication/co-pay assistance.   1-800-813-HOPE (671)099-3407) ? [place] Assists [place] cancer patients and their families through emotional , educational and financial support.  9040818298 ? [place] DSS Where to apply for food stamps, Medicaid and utility assistance. 548-173-8264 ? [employer]ATS: Transportation to medical appointments. 619-605-8623 ? Social Security Administration: May apply for disability if have a Stage IV cancer. (563)191-7335 (367)458-9454 ? [employer], Disability and Transit Services: Assists with nutrition, care and transit needs. [place] Support Programs: @10RELATIVEDAYS @ > Cancer Support Group  2nd Tuesday of the month 1pm-2pm, Journey Room  > Creative Journey  3rd Tuesday of the month 1130am-1pm, Journey Room  > Look Good Feel Better  1st Wednesday of the month 10am-12 noon, Journey Room (Call [place] to register [phone])

## 2017-02-11 NOTE — Progress Notes (Signed)
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 Junious Silk      02/26/2014 Tumor Marker    PSA = 399.5      03/14/2014 Tumor Marker    PSA= 89.51      03/14/2014 - 06/26/2014 Chemotherapy    Docetaxel 75 mg/kg every 21 days x 6 cycles      04/24/2014 Tumor Marker    PSA- 19.89      07/23/2014 Procedure    Nephrostomy tube removed by IR, Dr. Geroge Baseman      07/24/2014 Tumor Marker    PSA= 6.15      10/16/2014 Tumor Marker    PSA: 3.54       01/08/2015 Tumor Marker    PSA: 2.13       01/17/2015 Imaging    CT CAP-  Massive pelvic and retroperitoneal lymphadenopathy noted on the prior study has nearly completely resolved, now with only a small amount of residual amorphous soft tissue predominantly around the the infrarenal abdominal aorta.      01/17/2015 Imaging    Bone scan- Widespread osseous metastatic disease with multiple foci of increased activity throughout the skeleton, corresponding with the findings on the prior CT from October, 2015.      04/11/2015 Tumor Marker    PSA: 1.87       07/21/2015 Imaging    Bone scan- Bony metastatic disease again noted at multiple sites, stable from prior study. No progression of bony metastatic disease is demonstrable on this study.      07/25/2015 Imaging    CT CAP- Stable matted soft tissue  density in the retroperitoneum and extraperitoneal pelvis consistent with treated disease. No recurrent lymphadenopathy.  No pulmonary metastatic disease. Diffuse stable sclerotic metastatic bone disease.      01/14/2016 Imaging    Bone scan-  Multiple sites of abnormal increased tracer localization involving BILATERAL ribs, T11, T12, questionably RIGHT scapula and LEFT iliac bone suspicious for osseous metastases.      01/23/2016 Imaging    CT CAP- 1. Similar widespread osseous metastasis. 2. Similar soft tissue thickening within the retroperitoneum of the abdomen. Improved left pelvic side wall soft tissue thickening. These are consistent with sites of treated disease. No well-defined adenopathy. 3. No new sites of disease.      05/03/2016 -  Chemotherapy    Jevtana every 21 days       09/06/2016 Imaging    Restaging CT C/A/P: IMPRESSION: 1. Similar appearance of widespread sclerotic bone metastases. 2. No change in soft tissue thickening within the retroperitoneum and left pelvic sidewall. No well defined adenopathy identified. 3. No new sites of disease 4. Aortic Atherosclerosis (ICD10-I70.0). Coronary artery calcifications noted. 5. 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 6. Stable 9 mm pancreatic  cystic lesion. Favor pseudocyst. Indolent neoplasm may look similar. Attention on follow-up imaging.      09/06/2016 Imaging    Bone Scan: IMPRESSION: In this patient with known diffuse sclerotic metastatic disease, bone scan does not reveal progression of radiotracer uptake. Several of the areas of previously demonstrated radiotracer uptake appear less prominent.  Change in appearance of radiotracer uptake involving the mandible now more notable on the right and previously more notable on the left may reflect result of dental disease rather than metastatic disease.         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 Completed Docetaxel x 6 cycles on 06/26/14 with great response.  Now slowly rising PSA (doubling time ~ 9 months) resulting in restarting systemic treatment with Jevtana beginning on 05/03/16    INTERVAL HISTORY:  [name] returns for routine follow-up and consideration for next cycle Jevtana.    Overall, he tells me that he has been feeling very well. His appetite is 100%; energy levels 75%.   Continue to have back pain, which is well-controlled with PRN oxycodone.  On a "bad day", he will take 3 oxycodone per day. Generally, he requires 1-2 oxycodone per day.  Requesting refill of oxycodone today. Bowels moving well; denies constipation or diarrhea. Denies any rash. Denies any abnormal bleeding or bruising.    Continues to receive Xgeva and vitamin B12 injections monthly. His last chemo was 11/10/16; unclear why he has not had Jevtana therapy since that time.    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 He really enjoys his work. His employer/boss is very supportive of his oncologic care.    REVIEW OF SYSTEMS:  Review of Systems  Constitutional: Positive for fatigue (occasional fatigue). Negative for chills and fever.  HENT:  Negative.  Negative for lump/mass and nosebleeds.   Eyes: Negative.   Respiratory: Negative.  Negative for cough and shortness of breath.   Cardiovascular: Negative.  Negative for chest pain and leg swelling.  Gastrointestinal: Negative.  Negative for abdominal pain, blood in stool, constipation, diarrhea, nausea and vomiting.  Endocrine: Negative.   Genitourinary: Negative.  Negative for dysuria and hematuria.   Musculoskeletal: Positive for back pain. Negative for arthralgias.  Skin: Negative.  Negative for rash.  Neurological: Negative.  Negative for dizziness and headaches.  Hematological: Negative.  Negative for adenopathy. Does not  bruise/bleed easily.  Psychiatric/Behavioral: Negative.  Negative for depression and sleep disturbance. The patient is not nervous/anxious.      PAST MEDICAL/SURGICAL HISTORY:  Past Medical History:  Diagnosis Date  . Acute renal failure ([place]) 01/26/2014  . Alcohol abuse   . Anemia of chronic disease 01/26/2014  . Bilateral hydronephrosis 01/26/2014  . History of cocaine abuse 01/26/2014  . Homelessness 01/31/2014  . Prostate cancer metastatic to multiple sites ([place]) 01/30/2014  . Sepsis ([place])   . UTI (lower urinary tract infection)    Past Surgical History:  Procedure Laterality Date  . ORCHIECTOMY Bilateral 02/05/2014   Procedure: BILATERAL ORCHIECTOMY;  Surgeon: Festus Aloe, MD;  Location: [facility];  Service: Urology;  Laterality: Bilateral;  . PERCUTANEOUS NEPHROSTOMY Bilateral    IR Dr. Junious Silk changed on 04/22/2014  . PORTACATH PLACEMENT Right 03/12/14     SOCIAL HISTORY:  Social History   Social History  . Marital status: Single    Spouse name: N/A  . Number of children: 1  . Years of education: 9th  Occupational History  . disabilty [employer]   Social History Main Topics  . Smoking status: Current Every Day Smoker    Packs/day: 0.25    Years: 55.00    Types: Cigarettes  . Smokeless tobacco: Never Used  . Alcohol use No     Comment: none for at least 7 -8 years  . Drug use: Yes    Types: Cocaine     Comment: last used Cocaine 01/25/14  . Sexual activity: Yes   Other Topics Concern  . Not on file   Social History Narrative   Lives at [employer] 9[employer] grade   1 son, lives in [place], [place]   Can read and write in native language             FAMILY HISTORY:  Family History  Problem Relation Age of Onset  . Cancer Brother 63    CURRENT MEDICATIONS:  Outpatient Encounter Prescriptions as of 02/11/2017  Medication Sig Note  . Cabazitaxel (JEVTANA IV) Inject into the vein. Every 3 weeks   . calcium-vitamin D (OSCAL WITH  D) 500-200 MG-UNIT tablet Take 2 tablets by mouth daily with breakfast. (Patient not taking: Reported on 10/20/2016)   . lidocaine-prilocaine (EMLA) cream Apply a quarter size amount to port site 1 hour prior to chemo. Do not rub in. Cover with plastic wrap. 07/24/2014: Only takes when getting port flushed  . metoCLOPramide (REGLAN) 5 MG tablet The day after chemo take 1 tab four times a day x 2 days. Then may take 1 tab four times a day if needed for nausea/vomiting. 07/24/2014: Just takes as needed  . ondansetron (ZOFRAN) 8 MG tablet Take 1 tablet (8 mg total) by mouth every 8 (eight) hours as needed for nausea or vomiting.   [name] Kitchen oxyCODONE (ROXICODONE) 15 MG immediate release tablet Take 1 tablet (15 mg total) by mouth every 4 (four) hours as needed for pain.   . predniSONE (DELTASONE) 10 MG tablet Take 1 tablet (10 mg total) by mouth daily with breakfast.   . prochlorperazine (COMPAZINE) 10 MG tablet The day after chemo take 1 tab four times a day x 2 days. Then may take 1 tab four times a day if needed for nausea/vomiting. 07/24/2014: Just takes as needed  . [DISCONTINUED] oxyCODONE (ROXICODONE) 15 MG immediate release tablet Take 1 tablet (15 mg total) by mouth every 4 (four) hours as needed for pain.    Facility-Administered Encounter Medications as of 02/11/2017  Medication  . cyanocobalamin ((VITAMIN B-12)) injection 1,000 mcg  . denosumab (XGEVA) injection 120 mg    ALLERGIES:  No Known Allergies   PHYSICAL EXAM:  ECOG Performance status: 1 - Symptomatic, but independent.      Physical Exam  Constitutional: He is oriented to person, place, and time.  Thin male in no acute distress  HENT:  Head: Normocephalic.  Mouth/Throat: Oropharynx is clear and moist. No oropharyngeal exudate.  Eyes: Pupils are equal, round, and reactive to light. Conjunctivae are normal. No scleral icterus.  Neck: Normal range of motion. Neck supple.  Cardiovascular: Normal rate and regular rhythm.    Pulmonary/Chest: Effort normal and breath sounds normal. No respiratory distress.  Abdominal: Soft. Bowel sounds are normal. There is no tenderness.  Musculoskeletal: Normal range of motion. He exhibits no edema.  Lymphadenopathy:    He has no cervical adenopathy.       Right: No supraclavicular adenopathy present.       Left: No supraclavicular adenopathy  present.  Neurological: He is alert and oriented to person, place, and time. No cranial nerve deficit. Gait normal.  Skin: Skin is warm and dry. No rash noted.  Psychiatric: Mood, memory, affect and judgment normal.  Nursing note and vitals reviewed.    LABORATORY DATA:  I have reviewed the labs as listed.  CBC    Component Value Date/Time   WBC 4.4 02/14/2017 0820   RBC 3.89 (L) 02/14/2017 0820   HGB 11.6 (L) 02/14/2017 0820   HCT 35.1 (L) 02/14/2017 0820   PLT 212 02/14/2017 0820   MCV 90.2 02/14/2017 0820   MCH 29.8 02/14/2017 0820   MCHC 33.0 02/14/2017 0820   RDW 14.8 02/14/2017 0820   LYMPHSABS 1.4 02/14/2017 0820   MONOABS 0.7 02/14/2017 0820   EOSABS 0.2 02/14/2017 0820   BASOSABS 0.0 02/14/2017 0820   CMP Latest Ref Rng & Units 02/14/2017 02/11/2017 01/14/2017  Glucose 65 - 99 mg/dL 100(H) 88 93  BUN 6 - 20 mg/dL 24(H) 22(H) 20  Creatinine 0.61 - 1.24 mg/dL 1.84(H) 2.05(H) 2.04(H)  Sodium 135 - 145 mmol/L 136 137 136  Potassium 3.5 - 5.1 mmol/L 4.3 5.2(H) 4.4  Chloride 101 - 111 mmol/L 104 105 104  CO2 22 - 32 mmol/L 24 25 25   Calcium 8.9 - 10.3 mg/dL 8.8(L) 9.1 9.0  Total Protein 6.5 - 8.1 g/dL 7.9 8.0 7.8  Total Bilirubin 0.3 - 1.2 mg/dL 0.4 0.3 0.4  Alkaline Phos 38 - 126 U/L 53 48 44  AST 15 - 41 U/L 26 25 25   ALT 17 - 63 U/L 16(L) 16(L) 16(L)       PENDING LABS:    DIAGNOSTIC IMAGING:  Last CT chest/abd/pelvis: 12/20/16 CLINICAL DATA:  Metastatic prostate cancer.  EXAM: CT CHEST, ABDOMEN, AND PELVIS WITH CONTRAST  TECHNIQUE: Multidetector CT imaging of the chest, abdomen and pelvis  was performed following the standard protocol during bolus administration of intravenous contrast.  CONTRAST:  53mL ISOVUE-300 IOPAMIDOL (ISOVUE-300) INJECTION 61%  COMPARISON:  09/06/2016.  FINDINGS: CT CHEST FINDINGS  Cardiovascular: Right IJ Port-A-Cath terminates in the SVC. Three-vessel coronary artery calcification. Heart is enlarged. No pericardial effusion.  Mediastinum/Nodes: No pathologically enlarged mediastinal, hilar or axillary lymph nodes. Esophagus is grossly unremarkable.  Lungs/Pleura: Moderate centrilobular and mild paraseptal emphysema. There is interstitial and subpleural coarsening with mild traction bronchiectasis/bronchiolectasis, as on prior exams. No pleural fluid. Adherent debris in the trachea. Airway is otherwise unremarkable.  Musculoskeletal: Sclerotic lesions are seen throughout the visualized osseous structures, as before. Mild compression of the T3 vertebral body, unchanged.  CT ABDOMEN PELVIS FINDINGS  Hepatobiliary: Liver and gallbladder are unremarkable. No biliary ductal dilatation.  Pancreas: Negative.  Spleen: Negative.  Adrenals/Urinary Tract: Adrenal glands are unremarkable. Scarring in the right kidney. Left kidney is atrophic. Ureters are decompressed. Bladder is indented by the prostate.  Stomach/Bowel: Small hiatal hernia. Stomach, small bowel, appendix and colon are otherwise unremarkable.  Vascular/Lymphatic: Atherosclerotic calcification of the arterial vasculature without abdominal aortic aneurysm. No pathologically enlarged lymph nodes.  Reproductive: Prostate indents the bladder.  Other: Trace pelvic free fluid. Mesenteries and peritoneum are otherwise unremarkable.  Musculoskeletal: Sclerotic lesions are seen throughout the visualized osseous structures.  IMPRESSION: 1. Osseous metastatic disease, as before. No additional evidence of metastatic disease in the chest, abdomen or pelvis. 2.  Aortic atherosclerosis (ICD10-170.0). Three-vessel coronary artery calcification. 3.  Emphysema (ICD10-J43.9). 4. Pulmonary fibrotic changes, grossly stable, suggestive of nonspecific interstitial pneumonitis.   Electronically Signed   By: Rip Harbour  Blietz M.D.   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  -Clinically, he is tolerating Jevtana very well; Previous PSA values since starting Jevtana 3.96, 3.92, & 3.42.   -PSA has been appropriately trending down, which supports good clinical response to therapy. Most recent PSA  0.89 on 01/14/17, which is excellent.  -Most recent restaging CT chest/abd/pelvis on 12/20/16 revealed stable osseous metastatic disease, but no new disease in the chest/abd/pelvis.  -Will have him come back to cancer center early next week for Jevtana. (unclear why patient has not received chemo since 10/2016).  Will consider short-interval restaging imaging given this gap in therapy.  -Return to cancer center every 3 weeks for Jevtana.  -Return to cancer center for follow-up in 6 weeks for follow-up.   Bone mets:  -Continue monthly Xgeva injections to help reduce risk of skeletal related events. Last dose: Oncology Flowsheet 01/14/2017  denosumab (XGEVA)  120 mg   Low vitamin B12:  -Continue monthly vitamin B12 injections.   Pain secondary to metastatic disease:  -Tolerating oxycodone 15 mg tabs well; taking appropriately (1-2 tabs/day or 3 per day on "a bad day"). Overall pain well-controlled. Bowels are moving well.  -[place] Controlled Substance Reporting System reviewed. Paper prescription for oxycodone refill given to patient today.      Bowel regimen:  -Stable per patient report.  -Bowels moving regularly without additional  intervention; he understands the importance of adequate bowel regimen while taking opiates.   Tobacco use disorder:  -Commended his efforts to cut back on tobacco use. Currently smoking about 1 pack per week.  Encouraged him to continue the hard work on stopping all together.  He has noticed that his breathing has improved with decrease in his cigarette use. He is actively working on quitting altogether. Will continue to offer support.      Dispo:  -Return early next week for [place] every 3 weeks. Continue monthly Xgeva.  -Return to cancer center in 6 weeks for follow-up visit and subsequent treatment.     All questions were answered to patient's stated satisfaction. Encouraged patient to call with any new concerns or questions before his next visit to the cancer center and we can certain see him sooner, if needed.     Plan of care discussed with Dr. Talbert Cage, who agrees with the above aforementioned.    Orders placed this encounter:  No orders of the defined types were placed in this encounter.    Mike Craze, NP [place] [phone]

## 2017-02-11 NOTE — Patient Instructions (Signed)
[facility] at [facility] Discharge Instructions  RECOMMENDATIONS MADE BY THE CONSULTANT AND ANY TEST RESULTS WILL BE SENT TO YOUR REFERRING PHYSICIAN.  You were seen today by Gretchen Dawson NP.   Thank you for choosing  Cancer Center at [facility] to provide your oncology and hematology care.  To afford each patient quality time with our provider, please arrive at least 15 minutes before your scheduled appointment time.    If you have a lab appointment with the Cancer Center please come in thru the  Main Entrance and check in at the main information desk  You need to re-schedule your appointment should you arrive 10 or more minutes late.  We strive to give you quality time with our providers, and arriving late affects you and other patients whose appointments are after yours.  Also, if you no show three or more times for appointments you may be dismissed from the clinic at the providers discretion.     Again, thank you for choosing [facility].  Our hope is that these requests will decrease the amount of time that you wait before being seen by our physicians.       _____________________________________________________________  Should you have questions after your visit to [facility], please contact our office at [phone] between the hours of 8:30 a.m. and 4:30 p.m.  Voicemails left after 4:30 p.m. will not be returned until the following business day.  For prescription refill requests, have your pharmacy contact our office.       Resources For Cancer Patients and their Caregivers ? American Cancer Society: Can assist with transportation, wigs, general needs, runs Look Good Feel Better.        1-888-227-6333 ? Cancer Care: Provides financial assistance, online support groups, medication/co-pay assistance.  1-800-813-HOPE (4673) ? [facility] Assists [place] cancer patients and  their families through emotional , educational and financial support.  336-427-4357 ? [place] DSS Where to apply for food stamps, Medicaid and utility assistance. 336-342-1394 ? [facility]: Transportation to medical appointments. 336-347-2287 ? Social Security Administration: May apply for disability if have a Stage IV cancer. 336-342-7796 1-800-772-1213 ? [place] Aging, Disability and Transit Services: Assists with nutrition, care and transit needs. 336-349-2343  Cancer Center Support Programs: @[email or web address]@ > Cancer Support Group  2nd Tuesday of the month 1pm-2pm, Journey Room  > Creative Journey  3rd Tuesday of the month 1130am-1pm, Journey Room  > Look Good Feel Better  1st Wednesday of the month 10am-12 noon, Journey Room (Call American Cancer Society to register 1-800-395-5775)    

## 2017-02-14 ENCOUNTER — Encounter (HOSPITAL_COMMUNITY): Payer: Self-pay

## 2017-02-14 ENCOUNTER — Encounter (HOSPITAL_BASED_OUTPATIENT_CLINIC_OR_DEPARTMENT_OTHER): Payer: Medicare Other

## 2017-02-14 VITALS — BP 108/72 | HR 58 | Temp 98.3°F | Resp 16 | Wt 168.2 lb

## 2017-02-14 DIAGNOSIS — C61 Malignant neoplasm of prostate: Secondary | ICD-10-CM | POA: Diagnosis not present

## 2017-02-14 DIAGNOSIS — Z5111 Encounter for antineoplastic chemotherapy: Secondary | ICD-10-CM

## 2017-02-14 DIAGNOSIS — C7951 Secondary malignant neoplasm of bone: Secondary | ICD-10-CM | POA: Diagnosis not present

## 2017-02-14 DIAGNOSIS — Z5189 Encounter for other specified aftercare: Secondary | ICD-10-CM | POA: Diagnosis not present

## 2017-02-14 LAB — CBC WITH DIFFERENTIAL/PLATELET
Basophils Absolute: 0 10*3/uL (ref 0.0–0.1)
Basophils Relative: 0 %
EOS ABS: 0.2 10*3/uL (ref 0.0–0.7)
Eosinophils Relative: 4 %
HCT: 35.1 % — ABNORMAL LOW (ref 39.0–52.0)
HEMOGLOBIN: 11.6 g/dL — AB (ref 13.0–17.0)
LYMPHS ABS: 1.4 10*3/uL (ref 0.7–4.0)
Lymphocytes Relative: 31 %
MCH: 29.8 pg (ref 26.0–34.0)
MCHC: 33 g/dL (ref 30.0–36.0)
MCV: 90.2 fL (ref 78.0–100.0)
MONOS PCT: 16 %
Monocytes Absolute: 0.7 10*3/uL (ref 0.1–1.0)
Neutro Abs: 2.2 10*3/uL (ref 1.7–7.7)
Neutrophils Relative %: 49 %
Platelets: 212 10*3/uL (ref 150–400)
RBC: 3.89 MIL/uL — ABNORMAL LOW (ref 4.22–5.81)
RDW: 14.8 % (ref 11.5–15.5)
WBC: 4.4 10*3/uL (ref 4.0–10.5)

## 2017-02-14 LAB — COMPREHENSIVE METABOLIC PANEL
ALK PHOS: 53 U/L (ref 38–126)
ALT: 16 U/L — AB (ref 17–63)
ANION GAP: 8 (ref 5–15)
AST: 26 U/L (ref 15–41)
Albumin: 3.7 g/dL (ref 3.5–5.0)
BILIRUBIN TOTAL: 0.4 mg/dL (ref 0.3–1.2)
BUN: 24 mg/dL — ABNORMAL HIGH (ref 6–20)
CALCIUM: 8.8 mg/dL — AB (ref 8.9–10.3)
CO2: 24 mmol/L (ref 22–32)
CREATININE: 1.84 mg/dL — AB (ref 0.61–1.24)
Chloride: 104 mmol/L (ref 101–111)
GFR, EST AFRICAN AMERICAN: 41 mL/min — AB (ref >60)
GFR, EST NON AFRICAN AMERICAN: 35 mL/min — AB (ref >60)
Glucose, Bld: 100 mg/dL — ABNORMAL HIGH (ref 65–99)
Potassium: 4.3 mmol/L (ref 3.5–5.1)
Sodium: 136 mmol/L (ref 135–145)
TOTAL PROTEIN: 7.9 g/dL (ref 6.5–8.1)

## 2017-02-14 MED ORDER — PEGFILGRASTIM 6 MG/0.6ML ~~LOC~~ PSKT
6.0000 mg | PREFILLED_SYRINGE | Freq: Once | SUBCUTANEOUS | Status: AC
  Administered 2017-02-14: 6 mg via SUBCUTANEOUS
  Filled 2017-02-14: qty 0.6

## 2017-02-14 MED ORDER — DIPHENHYDRAMINE HCL 50 MG/ML IJ SOLN
25.0000 mg | Freq: Once | INTRAMUSCULAR | Status: AC
  Administered 2017-02-14: 25 mg via INTRAVENOUS

## 2017-02-14 MED ORDER — DEXAMETHASONE SODIUM PHOSPHATE 10 MG/ML IJ SOLN
INTRAMUSCULAR | Status: AC
  Filled 2017-02-14: qty 1

## 2017-02-14 MED ORDER — SODIUM CHLORIDE 0.9% FLUSH
10.0000 mL | INTRAVENOUS | Status: DC | PRN
  Administered 2017-02-14: 10 mL
  Filled 2017-02-14: qty 10

## 2017-02-14 MED ORDER — FAMOTIDINE IN NACL 20-0.9 MG/50ML-% IV SOLN
20.0000 mg | Freq: Once | INTRAVENOUS | Status: AC
  Administered 2017-02-14: 20 mg via INTRAVENOUS

## 2017-02-14 MED ORDER — DEXTROSE 5 % IV SOLN
20.0000 mg/m2 | Freq: Once | INTRAVENOUS | Status: AC
  Administered 2017-02-14: 41 mg via INTRAVENOUS
  Filled 2017-02-14: qty 4.1

## 2017-02-14 MED ORDER — DEXAMETHASONE SODIUM PHOSPHATE 10 MG/ML IJ SOLN
10.0000 mg | Freq: Once | INTRAMUSCULAR | Status: AC
  Administered 2017-02-14: 10 mg via INTRAVENOUS

## 2017-02-14 MED ORDER — SODIUM CHLORIDE 0.9 % IV SOLN
Freq: Once | INTRAVENOUS | Status: AC
  Administered 2017-02-14: 09:00:00 via INTRAVENOUS

## 2017-02-14 MED ORDER — DIPHENHYDRAMINE HCL 50 MG/ML IJ SOLN
INTRAMUSCULAR | Status: AC
  Filled 2017-02-14: qty 1

## 2017-02-14 MED ORDER — FAMOTIDINE IN NACL 20-0.9 MG/50ML-% IV SOLN
INTRAVENOUS | Status: AC
  Filled 2017-02-14: qty 50

## 2017-02-14 MED ORDER — HEPARIN SOD (PORK) LOCK FLUSH 100 UNIT/ML IV SOLN
500.0000 [IU] | Freq: Once | INTRAVENOUS | Status: AC | PRN
  Administered 2017-02-14: 500 [IU]
  Filled 2017-02-14: qty 5

## 2017-02-14 NOTE — Progress Notes (Signed)
Labs reviewed with MD, proceed with treatment.  [name] KitchenAdriana [name] arrived today for [facility] neulasta on body injector. See MAR for administration details. Injector in place and engaged with green light indicator on flashing. Tolerated application with out problems.   Treatment given per orders. Patient tolerated it well without problems. Vitals stable and discharged home from clinic ambulatory. Follow up as scheduled.

## 2017-02-14 NOTE — Patient Instructions (Signed)
[facility] Discharge Instructions for Patients Receiving Chemotherapy   Beginning January 23rd 2017 lab work for the Cancer Center will be done in the  Main lab at [place] on 1st floor. If you have a lab appointment with the Cancer Center please come in thru the  Main Entrance and check in at the main information desk   Today you received the following chemotherapy agents   To help prevent nausea and vomiting after your treatment, we encourage you to take your nausea medication     If you develop nausea and vomiting, or diarrhea that is not controlled by your medication, call the clinic.  The clinic phone number is [phone]. Office hours are Monday-Friday 8:30am-5:00pm.  BELOW ARE SYMPTOMS THAT SHOULD BE REPORTED IMMEDIATELY:  *FEVER GREATER THAN 101.0 F  *CHILLS WITH OR WITHOUT FEVER  NAUSEA AND VOMITING THAT IS NOT CONTROLLED WITH YOUR NAUSEA MEDICATION  *UNUSUAL SHORTNESS OF BREATH  *UNUSUAL BRUISING OR BLEEDING  TENDERNESS IN MOUTH AND THROAT WITH OR WITHOUT PRESENCE OF ULCERS  *URINARY PROBLEMS  *BOWEL PROBLEMS  UNUSUAL RASH Items with * indicate a potential emergency and should be followed up as soon as possible. If you have an emergency after office hours please contact your primary care physician or go to the nearest emergency department.  Please call the clinic during office hours if you have any questions or concerns.   You may also contact the Patient Navigator at [phone] should you have any questions or need assistance in obtaining follow up care.      Resources For Cancer Patients and their Caregivers ? American Cancer Society: Can assist with transportation, wigs, general needs, runs Look Good Feel Better.        1-888-227-6333 ? Cancer Care: Provides financial assistance, online support groups, medication/co-pay assistance.  1-800-813-HOPE (4673) ? [facility] Assists [place] cancer  patients and their families through emotional , educational and financial support.  336-427-4357 ? [place] DSS Where to apply for food stamps, Medicaid and utility assistance. 336-342-1394 ? [facility]: Transportation to medical appointments. 336-347-2287 ? Social Security Administration: May apply for disability if have a Stage IV cancer. 336-342-7796 1-800-772-1213 ? [place] Aging, Disability and Transit Services: Assists with nutrition, care and transit needs. 336-349-2343         

## 2017-02-15 ENCOUNTER — Telehealth (HOSPITAL_COMMUNITY): Payer: Self-pay

## 2017-02-15 NOTE — Telephone Encounter (Signed)
24 hour follow up -patient is doing good. No problems at this time

## 2017-03-07 ENCOUNTER — Encounter (HOSPITAL_COMMUNITY): Payer: Self-pay

## 2017-03-07 ENCOUNTER — Encounter (HOSPITAL_COMMUNITY): Payer: Medicare Other | Attending: Oncology

## 2017-03-07 VITALS — BP 103/56 | HR 65 | Temp 97.7°F | Resp 16 | Wt 169.0 lb

## 2017-03-07 DIAGNOSIS — C7951 Secondary malignant neoplasm of bone: Secondary | ICD-10-CM | POA: Diagnosis not present

## 2017-03-07 DIAGNOSIS — Z5111 Encounter for antineoplastic chemotherapy: Secondary | ICD-10-CM

## 2017-03-07 DIAGNOSIS — Z5189 Encounter for other specified aftercare: Secondary | ICD-10-CM | POA: Diagnosis not present

## 2017-03-07 DIAGNOSIS — M899 Disorder of bone, unspecified: Secondary | ICD-10-CM | POA: Insufficient documentation

## 2017-03-07 DIAGNOSIS — C61 Malignant neoplasm of prostate: Secondary | ICD-10-CM | POA: Diagnosis not present

## 2017-03-07 LAB — COMPREHENSIVE METABOLIC PANEL
ALK PHOS: 69 U/L (ref 38–126)
ALT: 24 U/L (ref 17–63)
ANION GAP: 7 (ref 5–15)
AST: 33 U/L (ref 15–41)
Albumin: 3.6 g/dL (ref 3.5–5.0)
BILIRUBIN TOTAL: 0.3 mg/dL (ref 0.3–1.2)
BUN: 20 mg/dL (ref 6–20)
CALCIUM: 7.9 mg/dL — AB (ref 8.9–10.3)
CO2: 21 mmol/L — ABNORMAL LOW (ref 22–32)
CREATININE: 1.86 mg/dL — AB (ref 0.61–1.24)
Chloride: 106 mmol/L (ref 101–111)
GFR calc non Af Amer: 35 mL/min — ABNORMAL LOW (ref >60)
GFR, EST AFRICAN AMERICAN: 41 mL/min — AB (ref >60)
GLUCOSE: 133 mg/dL — AB (ref 65–99)
Potassium: 4.4 mmol/L (ref 3.5–5.1)
Sodium: 134 mmol/L — ABNORMAL LOW (ref 135–145)
TOTAL PROTEIN: 7.3 g/dL (ref 6.5–8.1)

## 2017-03-07 LAB — CBC WITH DIFFERENTIAL/PLATELET
BASOS ABS: 0 10*3/uL (ref 0.0–0.1)
BASOS PCT: 0 %
EOS ABS: 0 10*3/uL (ref 0.0–0.7)
Eosinophils Relative: 0 %
HEMATOCRIT: 33.9 % — AB (ref 39.0–52.0)
Hemoglobin: 11.2 g/dL — ABNORMAL LOW (ref 13.0–17.0)
Lymphocytes Relative: 19 %
Lymphs Abs: 1.9 10*3/uL (ref 0.7–4.0)
MCH: 30.4 pg (ref 26.0–34.0)
MCHC: 33 g/dL (ref 30.0–36.0)
MCV: 92.1 fL (ref 78.0–100.0)
MONO ABS: 0.7 10*3/uL (ref 0.1–1.0)
Monocytes Relative: 7 %
NEUTROS ABS: 7.3 10*3/uL (ref 1.7–7.7)
Neutrophils Relative %: 74 %
PLATELETS: 246 10*3/uL (ref 150–400)
RBC: 3.68 MIL/uL — ABNORMAL LOW (ref 4.22–5.81)
RDW: 15.7 % — AB (ref 11.5–15.5)
WBC: 10 10*3/uL (ref 4.0–10.5)

## 2017-03-07 MED ORDER — DIPHENHYDRAMINE HCL 50 MG/ML IJ SOLN
INTRAMUSCULAR | Status: AC
  Filled 2017-03-07: qty 1

## 2017-03-07 MED ORDER — SODIUM CHLORIDE 0.9% FLUSH
10.0000 mL | INTRAVENOUS | Status: DC | PRN
  Administered 2017-03-07: 10 mL
  Filled 2017-03-07: qty 10

## 2017-03-07 MED ORDER — PEGFILGRASTIM 6 MG/0.6ML ~~LOC~~ PSKT
6.0000 mg | PREFILLED_SYRINGE | Freq: Once | SUBCUTANEOUS | Status: AC
  Administered 2017-03-07: 6 mg via SUBCUTANEOUS
  Filled 2017-03-07: qty 0.6

## 2017-03-07 MED ORDER — FAMOTIDINE IN NACL 20-0.9 MG/50ML-% IV SOLN
INTRAVENOUS | Status: AC
  Filled 2017-03-07: qty 50

## 2017-03-07 MED ORDER — HEPARIN SOD (PORK) LOCK FLUSH 100 UNIT/ML IV SOLN
500.0000 [IU] | Freq: Once | INTRAVENOUS | Status: AC | PRN
  Administered 2017-03-07: 500 [IU]
  Filled 2017-03-07 (×2): qty 5

## 2017-03-07 MED ORDER — SODIUM CHLORIDE 0.9 % IV SOLN
Freq: Once | INTRAVENOUS | Status: AC
  Administered 2017-03-07: 10:00:00 via INTRAVENOUS

## 2017-03-07 MED ORDER — DEXAMETHASONE SODIUM PHOSPHATE 10 MG/ML IJ SOLN
INTRAMUSCULAR | Status: AC
  Filled 2017-03-07: qty 1

## 2017-03-07 MED ORDER — FAMOTIDINE IN NACL 20-0.9 MG/50ML-% IV SOLN
20.0000 mg | Freq: Once | INTRAVENOUS | Status: AC
  Administered 2017-03-07: 20 mg via INTRAVENOUS

## 2017-03-07 MED ORDER — DEXAMETHASONE SODIUM PHOSPHATE 10 MG/ML IJ SOLN
10.0000 mg | Freq: Once | INTRAMUSCULAR | Status: AC
  Administered 2017-03-07: 10 mg via INTRAVENOUS

## 2017-03-07 MED ORDER — DIPHENHYDRAMINE HCL 50 MG/ML IJ SOLN
25.0000 mg | Freq: Once | INTRAMUSCULAR | Status: AC
  Administered 2017-03-07: 25 mg via INTRAVENOUS

## 2017-03-07 MED ORDER — DEXTROSE 5 % IV SOLN
20.0000 mg/m2 | Freq: Once | INTRAVENOUS | Status: AC
  Administered 2017-03-07: 41 mg via INTRAVENOUS
  Filled 2017-03-07: qty 4.1

## 2017-03-07 NOTE — Patient Instructions (Signed)
[facility] Discharge Instructions for Patients Receiving Chemotherapy   Beginning January 23rd 2017 lab work for the Cancer Center will be done in the  Main lab at [facility] [place] on 1st floor. If you have a lab appointment with the Cancer Center please come in thru the  Main Entrance and check in at the main information desk   Today you received the following chemotherapy agents   To help prevent nausea and vomiting after your treatment, we encourage you to take your nausea medication     If you develop nausea and vomiting, or diarrhea that is not controlled by your medication, call the clinic.  The clinic phone number is [phone]. Office hours are Monday-Friday 8:30am-5:00pm.  BELOW ARE SYMPTOMS THAT SHOULD BE REPORTED IMMEDIATELY:  *FEVER GREATER THAN 101.0 F  *CHILLS WITH OR WITHOUT FEVER  NAUSEA AND VOMITING THAT IS NOT CONTROLLED WITH YOUR NAUSEA MEDICATION  *UNUSUAL SHORTNESS OF BREATH  *UNUSUAL BRUISING OR BLEEDING  TENDERNESS IN MOUTH AND THROAT WITH OR WITHOUT PRESENCE OF ULCERS  *URINARY PROBLEMS  *BOWEL PROBLEMS  UNUSUAL RASH Items with * indicate a potential emergency and should be followed up as soon as possible. If you have an emergency after office hours please contact your primary care physician or go to the nearest emergency department.  Please call the clinic during office hours if you have any questions or concerns.   You may also contact the Patient Navigator at [phone] should you have any questions or need assistance in obtaining follow up care.      Resources For Cancer Patients and their Caregivers ? American Cancer Society: Can assist with transportation, wigs, general needs, runs Look Good Feel Better.        1-888-227-6333 ? Cancer Care: Provides financial assistance, online support groups, medication/co-pay assistance.  1-800-813-HOPE (4673) ? [facility] Assists [place] cancer  patients and their families through emotional , educational and financial support.  336-427-4357 ? [place] DSS Where to apply for food stamps, Medicaid and utility assistance. 336-342-1394 ? [facility]: Transportation to medical appointments. 336-347-2287 ? Social Security Administration: May apply for disability if have a Stage IV cancer. 336-342-7796 1-800-772-1213 ? [place] Aging, Disability and Transit Services: Assists with nutrition, care and transit needs. 336-349-2343         

## 2017-03-07 NOTE — Progress Notes (Signed)
Labs reviewed with MD. Proceed with treatment.  [name] KitchenAdriana [name] arrived today for [facility] neulasta on body injector. See MAR for administration details. Injector in place and engaged with green light indicator on flashing. Tolerated application with out problems.  Treatment given per orders. Patient tolerated it well without problems. Vitals stable and discharged home from clinic ambulatory. Follow up as scheduled.

## 2017-03-08 MED ORDER — HEPARIN SOD (PORK) LOCK FLUSH 100 UNIT/ML IV SOLN
INTRAVENOUS | Status: AC
  Filled 2017-03-08: qty 5

## 2017-03-11 ENCOUNTER — Encounter (HOSPITAL_COMMUNITY): Payer: Medicare Other

## 2017-03-11 ENCOUNTER — Encounter (HOSPITAL_BASED_OUTPATIENT_CLINIC_OR_DEPARTMENT_OTHER): Payer: Medicare Other

## 2017-03-11 DIAGNOSIS — C61 Malignant neoplasm of prostate: Secondary | ICD-10-CM

## 2017-03-11 DIAGNOSIS — C7951 Secondary malignant neoplasm of bone: Secondary | ICD-10-CM | POA: Diagnosis present

## 2017-03-11 DIAGNOSIS — M899 Disorder of bone, unspecified: Secondary | ICD-10-CM | POA: Diagnosis not present

## 2017-03-11 DIAGNOSIS — E538 Deficiency of other specified B group vitamins: Secondary | ICD-10-CM | POA: Diagnosis not present

## 2017-03-11 DIAGNOSIS — G893 Neoplasm related pain (acute) (chronic): Secondary | ICD-10-CM

## 2017-03-11 LAB — CBC WITH DIFFERENTIAL/PLATELET
BASOS ABS: 0.3 10*3/uL — AB (ref 0.0–0.1)
BASOS PCT: 0 %
BASOS PCT: 1 %
Basophils Absolute: 0 10*3/uL (ref 0.0–0.1)
EOS PCT: 0 %
EOS PCT: 0 %
Eosinophils Absolute: 0 10*3/uL (ref 0.0–0.7)
Eosinophils Absolute: 0 10*3/uL (ref 0.0–0.7)
HEMATOCRIT: 32.5 % — AB (ref 39.0–52.0)
HEMATOCRIT: 32.8 % — AB (ref 39.0–52.0)
HEMOGLOBIN: 11.1 g/dL — AB (ref 13.0–17.0)
Hemoglobin: 10.9 g/dL — ABNORMAL LOW (ref 13.0–17.0)
LYMPHS ABS: 1.3 10*3/uL (ref 0.7–4.0)
LYMPHS PCT: 2 %
Lymphocytes Relative: 2 %
Lymphs Abs: 1.4 10*3/uL (ref 0.7–4.0)
MCH: 30.7 pg (ref 26.0–34.0)
MCH: 30.8 pg (ref 26.0–34.0)
MCHC: 33.5 g/dL (ref 30.0–36.0)
MCHC: 33.8 g/dL (ref 30.0–36.0)
MCV: 91.5 fL (ref 78.0–100.0)
MCV: 91.1 fL (ref 78.0–100.0)
MONO ABS: 1.3 10*3/uL — AB (ref 0.1–1.0)
MONOS PCT: 2 %
Monocytes Absolute: 1 10*3/uL (ref 0.1–1.0)
Monocytes Relative: 1 %
NEUTROS ABS: 67.8 10*3/uL — AB (ref 1.7–7.7)
NEUTROS PCT: 96 %
Neutro Abs: 63.1 10*3/uL — ABNORMAL HIGH (ref 1.7–7.7)
Neutrophils Relative %: 96 %
Platelets: 241 10*3/uL (ref 150–400)
Platelets: 236 10*3/uL (ref 150–400)
RBC: 3.55 MIL/uL — ABNORMAL LOW (ref 4.22–5.81)
RBC: 3.6 MIL/uL — AB (ref 4.22–5.81)
RDW: 16.1 % — AB (ref 11.5–15.5)
RDW: 16.1 % — AB (ref 11.5–15.5)
WBC Morphology: INCREASED
WBC: 65.7 10*3/uL — AB (ref 4.0–10.5)
WBC: 74.5 10*3/uL — AB (ref 4.0–10.5)

## 2017-03-11 LAB — COMPREHENSIVE METABOLIC PANEL
ALK PHOS: 121 U/L (ref 38–126)
ALT: 20 U/L (ref 17–63)
ANION GAP: 6 (ref 5–15)
AST: 20 U/L (ref 15–41)
Albumin: 3.7 g/dL (ref 3.5–5.0)
BILIRUBIN TOTAL: 0.4 mg/dL (ref 0.3–1.2)
BUN: 21 mg/dL — ABNORMAL HIGH (ref 6–20)
CALCIUM: 8.5 mg/dL — AB (ref 8.9–10.3)
CO2: 22 mmol/L (ref 22–32)
Chloride: 104 mmol/L (ref 101–111)
Creatinine, Ser: 1.7 mg/dL — ABNORMAL HIGH (ref 0.61–1.24)
GFR calc Af Amer: 45 mL/min — ABNORMAL LOW (ref >60)
GFR, EST NON AFRICAN AMERICAN: 39 mL/min — AB (ref >60)
Glucose, Bld: 101 mg/dL — ABNORMAL HIGH (ref 65–99)
POTASSIUM: 4.8 mmol/L (ref 3.5–5.1)
Sodium: 132 mmol/L — ABNORMAL LOW (ref 135–145)
TOTAL PROTEIN: 7.5 g/dL (ref 6.5–8.1)

## 2017-03-11 MED ORDER — CYANOCOBALAMIN 1000 MCG/ML IJ SOLN
1000.0000 ug | Freq: Once | INTRAMUSCULAR | Status: AC
  Administered 2017-03-11: 1000 ug via INTRAMUSCULAR
  Filled 2017-03-11: qty 1

## 2017-03-11 MED ORDER — OXYCODONE HCL 15 MG PO TABS: 15.0000 mg | ORAL_TABLET | ORAL | 0 refills | Status: DC | PRN

## 2017-03-11 MED ORDER — DENOSUMAB 120 MG/1.7ML ~~LOC~~ SOLN
120.0000 mg | Freq: Once | SUBCUTANEOUS | Status: AC
  Administered 2017-03-11: 120 mg via SUBCUTANEOUS
  Filled 2017-03-11: qty 1.7

## 2017-03-11 NOTE — Progress Notes (Signed)
Reviewed labs with Dr. Talbert Cage and pharmacy with corrected calcium of 8.7 and ok to treat today per oncologist.    Patient tolerated b12 and xgeva with no complaints voiced.  Bilateral sites clean and dry with no bruising or bleeding noted at site.  Band aids applied.  Patient taking calcium as directed with no jaw, tooth, or leg pain.  VSS with discharge and left ambulatory.

## 2017-03-11 NOTE — Progress Notes (Signed)
CRITICAL VALUE STICKER  CRITICAL VALUE:WBC 65.7 DATE & TIME NOTIFIED: 11/16@0900  MD NOTIFIED: Dr. Twana First RESPONSE: orders received to redraw CBC for recheck

## 2017-03-11 NOTE — Progress Notes (Signed)
Patient's repeat CBC showed WBC 74.5, Dr. Talbert Cage aware and no further orders received.

## 2017-03-11 NOTE — Patient Instructions (Signed)
[place] at [facility]  Discharge Instructions:  You received a b12 shot and a xgeva shot today.  _______________________________________________________________  Thank you for choosing [place] at [facility] to provide your oncology and hematology care.  To afford each patient quality time with our providers, please arrive at least 15 minutes before your scheduled appointment.  You need to re-schedule your appointment if you arrive 10 or more minutes late.  We strive to give you quality time with our providers, and arriving late affects you and other patients whose appointments are after yours.  Also, if you no show three or more times for appointments you may be dismissed from the clinic.  Again, thank you for choosing [place] at [facility] hope is that these requests will allow you access to exceptional care and in a timely manner. _______________________________________________________________  If you have questions after your visit, please contact our office at [phone] between the hours of 8:30 a.m. and 5:00 p.m. Voicemails left after 4:30 p.m. will not be returned until the following business day. _______________________________________________________________  For prescription refill requests, have your pharmacy contact our office. _______________________________________________________________  Recommendations made by the consultant and any test results will be sent to your referring physician. _______________________________________________________________

## 2017-03-28 ENCOUNTER — Other Ambulatory Visit: Payer: Self-pay

## 2017-03-28 ENCOUNTER — Encounter (HOSPITAL_COMMUNITY): Payer: Self-pay | Admitting: Oncology

## 2017-03-28 ENCOUNTER — Encounter (HOSPITAL_COMMUNITY): Payer: Medicare Other | Attending: Oncology | Admitting: Oncology

## 2017-03-28 ENCOUNTER — Encounter (HOSPITAL_BASED_OUTPATIENT_CLINIC_OR_DEPARTMENT_OTHER): Payer: Medicare Other

## 2017-03-28 VITALS — Wt 163.0 lb

## 2017-03-28 VITALS — BP 115/78 | HR 80 | Temp 97.9°F | Resp 18

## 2017-03-28 DIAGNOSIS — R944 Abnormal results of kidney function studies: Secondary | ICD-10-CM | POA: Diagnosis not present

## 2017-03-28 DIAGNOSIS — M899 Disorder of bone, unspecified: Secondary | ICD-10-CM | POA: Insufficient documentation

## 2017-03-28 DIAGNOSIS — C61 Malignant neoplasm of prostate: Secondary | ICD-10-CM | POA: Diagnosis not present

## 2017-03-28 DIAGNOSIS — E538 Deficiency of other specified B group vitamins: Secondary | ICD-10-CM

## 2017-03-28 DIAGNOSIS — Z5189 Encounter for other specified aftercare: Secondary | ICD-10-CM | POA: Diagnosis not present

## 2017-03-28 DIAGNOSIS — N189 Chronic kidney disease, unspecified: Secondary | ICD-10-CM | POA: Diagnosis not present

## 2017-03-28 DIAGNOSIS — Z5111 Encounter for antineoplastic chemotherapy: Secondary | ICD-10-CM

## 2017-03-28 DIAGNOSIS — C7951 Secondary malignant neoplasm of bone: Secondary | ICD-10-CM | POA: Diagnosis not present

## 2017-03-28 LAB — COMPREHENSIVE METABOLIC PANEL
ALBUMIN: 4.3 g/dL (ref 3.5–5.0)
ALT: 18 U/L (ref 17–63)
AST: 25 U/L (ref 15–41)
Alkaline Phosphatase: 78 U/L (ref 38–126)
Anion gap: 9 (ref 5–15)
BUN: 41 mg/dL — AB (ref 6–20)
CO2: 25 mmol/L (ref 22–32)
CREATININE: 2.46 mg/dL — AB (ref 0.61–1.24)
Calcium: 9.9 mg/dL (ref 8.9–10.3)
Chloride: 101 mmol/L (ref 101–111)
GFR calc non Af Amer: 25 mL/min — ABNORMAL LOW (ref >60)
GFR, EST AFRICAN AMERICAN: 29 mL/min — AB (ref >60)
GLUCOSE: 102 mg/dL — AB (ref 65–99)
Potassium: 5.1 mmol/L (ref 3.5–5.1)
SODIUM: 135 mmol/L (ref 135–145)
Total Bilirubin: 0.4 mg/dL (ref 0.3–1.2)
Total Protein: 8.8 g/dL — ABNORMAL HIGH (ref 6.5–8.1)

## 2017-03-28 LAB — CBC WITH DIFFERENTIAL/PLATELET
Basophils Absolute: 0 10*3/uL (ref 0.0–0.1)
Basophils Relative: 0 %
EOS ABS: 0.1 10*3/uL (ref 0.0–0.7)
Eosinophils Relative: 1 %
HEMATOCRIT: 38.4 % — AB (ref 39.0–52.0)
Hemoglobin: 12.4 g/dL — ABNORMAL LOW (ref 13.0–17.0)
Lymphocytes Relative: 23 %
Lymphs Abs: 1.6 10*3/uL (ref 0.7–4.0)
MCH: 29.8 pg (ref 26.0–34.0)
MCHC: 32.3 g/dL (ref 30.0–36.0)
MCV: 92.3 fL (ref 78.0–100.0)
MONO ABS: 0.9 10*3/uL (ref 0.1–1.0)
MONOS PCT: 13 %
NEUTROS PCT: 63 %
Neutro Abs: 4.4 10*3/uL (ref 1.7–7.7)
PLATELETS: 336 10*3/uL (ref 150–400)
RBC: 4.16 MIL/uL — ABNORMAL LOW (ref 4.22–5.81)
RDW: 16.7 % — ABNORMAL HIGH (ref 11.5–15.5)
WBC: 7 10*3/uL (ref 4.0–10.5)

## 2017-03-28 MED ORDER — SODIUM CHLORIDE 0.9 % IV SOLN
INTRAVENOUS | Status: DC
  Administered 2017-03-28: 11:00:00 via INTRAVENOUS

## 2017-03-28 MED ORDER — FAMOTIDINE IN NACL 20-0.9 MG/50ML-% IV SOLN
20.0000 mg | Freq: Once | INTRAVENOUS | Status: AC
  Administered 2017-03-28: 20 mg via INTRAVENOUS
  Filled 2017-03-28: qty 50

## 2017-03-28 MED ORDER — HEPARIN SOD (PORK) LOCK FLUSH 100 UNIT/ML IV SOLN
500.0000 [IU] | Freq: Once | INTRAVENOUS | Status: AC | PRN
  Administered 2017-03-28: 500 [IU]
  Filled 2017-03-28 (×2): qty 5

## 2017-03-28 MED ORDER — SODIUM CHLORIDE 0.9 % IV SOLN
Freq: Once | INTRAVENOUS | Status: AC
  Administered 2017-03-28: 11:00:00 via INTRAVENOUS

## 2017-03-28 MED ORDER — PEGFILGRASTIM 6 MG/0.6ML ~~LOC~~ PSKT
6.0000 mg | PREFILLED_SYRINGE | Freq: Once | SUBCUTANEOUS | Status: AC
  Administered 2017-03-28: 6 mg via SUBCUTANEOUS
  Filled 2017-03-28: qty 0.6

## 2017-03-28 MED ORDER — DEXAMETHASONE SODIUM PHOSPHATE 10 MG/ML IJ SOLN
10.0000 mg | Freq: Once | INTRAMUSCULAR | Status: AC
  Administered 2017-03-28: 10 mg via INTRAVENOUS
  Filled 2017-03-28: qty 1

## 2017-03-28 MED ORDER — DIPHENHYDRAMINE HCL 50 MG/ML IJ SOLN
25.0000 mg | Freq: Once | INTRAMUSCULAR | Status: AC
  Administered 2017-03-28: 25 mg via INTRAVENOUS
  Filled 2017-03-28: qty 1

## 2017-03-28 MED ORDER — DEXTROSE 5 % IV SOLN
20.0000 mg/m2 | Freq: Once | INTRAVENOUS | Status: AC
  Administered 2017-03-28: 41 mg via INTRAVENOUS
  Filled 2017-03-28: qty 4.1

## 2017-03-28 NOTE — Progress Notes (Signed)
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 Junious Silk      02/26/2014 Tumor Marker    PSA = 399.5      03/14/2014 Tumor Marker    PSA= 89.51      03/14/2014 - 06/26/2014 Chemotherapy    Docetaxel 75 mg/kg every 21 days x 6 cycles      04/24/2014 Tumor Marker    PSA- 19.89      07/23/2014 Procedure    Nephrostomy tube removed by IR, Dr. Geroge Baseman      07/24/2014 Tumor Marker    PSA= 6.15      10/16/2014 Tumor Marker    PSA: 3.54       01/08/2015 Tumor Marker    PSA: 2.13       01/17/2015 Imaging    CT CAP-  Massive pelvic and retroperitoneal lymphadenopathy noted on the prior study has nearly completely resolved, now with only a small amount of residual amorphous soft tissue predominantly around the the infrarenal abdominal aorta.      01/17/2015 Imaging    Bone scan- Widespread osseous metastatic disease with multiple foci of increased activity throughout the skeleton, corresponding with the findings on the prior CT from October, 2015.      04/11/2015 Tumor Marker    PSA: 1.87       07/21/2015 Imaging    Bone scan- Bony metastatic disease again noted at multiple sites, stable from prior study. No progression of bony metastatic disease is demonstrable on this study.      07/25/2015 Imaging    CT CAP- Stable matted soft tissue  density in the retroperitoneum and extraperitoneal pelvis consistent with treated disease. No recurrent lymphadenopathy.  No pulmonary metastatic disease. Diffuse stable sclerotic metastatic bone disease.      01/14/2016 Imaging    Bone scan-  Multiple sites of abnormal increased tracer localization involving BILATERAL ribs, T11, T12, questionably RIGHT scapula and LEFT iliac bone suspicious for osseous metastases.      01/23/2016 Imaging    CT CAP- 1. Similar widespread osseous metastasis. 2. Similar soft tissue thickening within the retroperitoneum of the abdomen. Improved left pelvic side wall soft tissue thickening. These are consistent with sites of treated disease. No well-defined adenopathy. 3. No new sites of disease.      05/03/2016 -  Chemotherapy    Jevtana every 21 days       09/06/2016 Imaging    Restaging CT C/A/P: IMPRESSION: 1. Similar appearance of widespread sclerotic bone metastases. 2. No change in soft tissue thickening within the retroperitoneum and left pelvic sidewall. No well defined adenopathy identified. 3. No new sites of disease 4. Aortic Atherosclerosis (ICD10-I70.0). Coronary artery calcifications noted. 5. 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 6. Stable 9 mm pancreatic  cystic lesion. Favor pseudocyst. Indolent neoplasm may look similar. Attention on follow-up imaging.      09/06/2016 Imaging    Bone Scan: IMPRESSION: In this patient with known diffuse sclerotic metastatic disease, bone scan does not reveal progression of radiotracer uptake. Several of the areas of previously demonstrated radiotracer uptake appear less prominent.  Change in appearance of radiotracer uptake involving the mandible now more notable on the right and previously more notable on the left may reflect result of dental disease rather than metastatic disease.         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 Completed Docetaxel x 6 cycles on 06/26/14 with great response.  Now slowly rising PSA (doubling time ~ 9 months) resulting in restarting systemic treatment with Jevtana beginning on 05/03/16    INTERVAL HISTORY:  [name] returns for routine follow-up and consideration for next cycle Jevtana.    Patient states that he feels well. He states his back pain has even improved. He continues to work full time. He denies any problems with the jevtana treatments and denies any side effects. He denies any chest pain, shortness of breath,abdominal pain, focal weakness, neuropathy, N/V/D, recent infections, or fevers/chills.   REVIEW OF SYSTEMS:  Review of Systems  Constitutional: Negative for chills, fatigue and fever.  HENT:  Negative.  Negative for lump/mass and nosebleeds.   Eyes: Negative.   Respiratory: Negative.  Negative for cough and shortness of breath.   Cardiovascular: Negative.  Negative for chest pain and leg swelling.  Gastrointestinal: Negative.  Negative for abdominal pain, blood in stool, constipation, diarrhea, nausea and vomiting.  Endocrine: Negative.   Genitourinary: Negative.  Negative for dysuria and hematuria.   Musculoskeletal: Negative for arthralgias and back pain.  Skin: Negative.  Negative for rash.  Neurological: Negative.  Negative for dizziness and headaches.  Hematological: Negative.  Negative for adenopathy. Does not bruise/bleed easily.  Psychiatric/Behavioral: Negative.  Negative for depression and sleep disturbance. The patient is not nervous/anxious.      PAST MEDICAL/SURGICAL HISTORY:  Past Medical History:  Diagnosis Date  . Acute renal failure ([place]) 01/26/2014  . Alcohol abuse   . Anemia of chronic disease 01/26/2014  . Bilateral hydronephrosis 01/26/2014  . History of cocaine abuse 01/26/2014  . Homelessness 01/31/2014  . Prostate cancer  metastatic to multiple sites ([place]) 01/30/2014  . Sepsis ([place])   . UTI (lower urinary tract infection)    Past Surgical History:  Procedure Laterality Date  . ORCHIECTOMY Bilateral 02/05/2014   Procedure: BILATERAL ORCHIECTOMY;  Surgeon: Festus Aloe, MD;  Location: [facility];  Service: Urology;  Laterality: Bilateral;  . PERCUTANEOUS NEPHROSTOMY Bilateral    IR Dr. Junious Silk changed on 04/22/2014  . PORTACATH PLACEMENT Right 03/12/14     SOCIAL HISTORY:  Social History   Socioeconomic History  . Marital status: Single    Spouse name: Not on file  . Number of children: 1  . Years of education: 9th  . Highest education level: Not on file  Social Needs  . Financial resource strain: Not on file  . Food insecurity - worry: Not on file  . Food insecurity - inability: Not on file  . Transportation needs - medical: Not on file  . Transportation needs - non-medical: Not on file  Occupational History  . Occupation: disabilty    Employer: [employer]  Tobacco Use  . Smoking status: Current Every Day Smoker  Packs/day: 0.25    Years: 55.00    Pack years: 13.75    Types: Cigarettes  . Smokeless tobacco: Never Used  Substance and Sexual Activity  . Alcohol use: No    Alcohol/week: 0.6 oz    Types: 1 Cans of beer per week    Comment: none for at least 7 -8 years  . Drug use: Yes    Types: Cocaine    Comment: last used Cocaine 01/25/14  . Sexual activity: Yes  Other Topics Concern  . Not on file  Social History Narrative   Lives at [employer]   1 son, lives in [place], [place]   Can read and write in native language             FAMILY HISTORY:  Family History  Problem Relation Age of Onset  . Cancer Brother 41    CURRENT MEDICATIONS:  Outpatient Encounter Medications as of 03/28/2017  Medication Sig Note  . Cabazitaxel (JEVTANA IV) Inject into the vein. Every 3 weeks   . calcium-vitamin D (OSCAL WITH D) 500-200 MG-UNIT tablet Take 2  tablets by mouth daily.   [name] Kitchen lidocaine-prilocaine (EMLA) cream Apply a quarter size amount to port site 1 hour prior to chemo. Do not rub in. Cover with plastic wrap. 07/24/2014: Only takes when getting port flushed  . metoCLOPramide (REGLAN) 5 MG tablet The day after chemo take 1 tab four times a day x 2 days. Then may take 1 tab four times a day if needed for nausea/vomiting. 07/24/2014: Just takes as needed  . ondansetron (ZOFRAN) 8 MG tablet Take 1 tablet (8 mg total) by mouth every 8 (eight) hours as needed for nausea or vomiting.   [name] Kitchen oxyCODONE (ROXICODONE) 15 MG immediate release tablet Take 1 tablet (15 mg total) every 4 (four) hours as needed by mouth for pain.   [name] Kitchen prochlorperazine (COMPAZINE) 10 MG tablet The day after chemo take 1 tab four times a day x 2 days. Then may take 1 tab four times a day if needed for nausea/vomiting. 07/24/2014: Just takes as needed  . predniSONE (DELTASONE) 10 MG tablet Take 1 tablet (10 mg total) by mouth daily with breakfast. (Patient not taking: Reported on 03/28/2017)   . [DISCONTINUED] calcium-vitamin D (OSCAL WITH D) 500-200 MG-UNIT tablet Take 2 tablets by mouth daily with breakfast. (Patient not taking: Reported on 10/20/2016)    Facility-Administered Encounter Medications as of 03/28/2017  Medication  . cyanocobalamin ((VITAMIN B-12)) injection 1,000 mcg  . denosumab (XGEVA) injection 120 mg    ALLERGIES:  No Known Allergies   PHYSICAL EXAM:  ECOG Performance status: 1 - Symptomatic, but independent.      Physical Exam  Constitutional: He is oriented to person, place, and time.  Thin male in no acute distress  HENT:  Head: Normocephalic.  Mouth/Throat: Oropharynx is clear and moist. No oropharyngeal exudate.  Eyes: Conjunctivae are normal. Pupils are equal, round, and reactive to light. No scleral icterus.  Neck: Normal range of motion. Neck supple.  Cardiovascular: Normal rate and regular rhythm.  Pulmonary/Chest: Effort normal and breath  sounds normal. No respiratory distress.  Abdominal: Soft. Bowel sounds are normal. There is no tenderness.  Musculoskeletal: Normal range of motion. He exhibits no edema.  Lymphadenopathy:    He has no cervical adenopathy.       Right: No supraclavicular adenopathy present.       Left: No supraclavicular adenopathy present.  Neurological: He is  alert and oriented to person, place, and time. No cranial nerve deficit. Gait normal.  Skin: Skin is warm and dry. No rash noted.  Psychiatric: Mood, memory, affect and judgment normal.  Nursing note and vitals reviewed.    LABORATORY DATA:  I have reviewed the labs as listed.  CBC    Component Value Date/Time   WBC 7.0 03/28/2017 0933   RBC 4.16 (L) 03/28/2017 0933   HGB 12.4 (L) 03/28/2017 0933   HCT 38.4 (L) 03/28/2017 0933   PLT 336 03/28/2017 0933   MCV 92.3 03/28/2017 0933   MCH 29.8 03/28/2017 0933   MCHC 32.3 03/28/2017 0933   RDW 16.7 (H) 03/28/2017 0933   LYMPHSABS 1.6 03/28/2017 0933   MONOABS 0.9 03/28/2017 0933   EOSABS 0.1 03/28/2017 0933   BASOSABS 0.0 03/28/2017 0933   CMP Latest Ref Rng & Units 03/28/2017 03/11/2017 03/07/2017  Glucose 65 - 99 mg/dL 102(H) 101(H) 133(H)  BUN 6 - 20 mg/dL 41(H) 21(H) 20  Creatinine 0.61 - 1.24 mg/dL 2.46(H) 1.70(H) 1.86(H)  Sodium 135 - 145 mmol/L 135 132(L) 134(L)  Potassium 3.5 - 5.1 mmol/L 5.1 4.8 4.4  Chloride 101 - 111 mmol/L 101 104 106  CO2 22 - 32 mmol/L 25 22 21(L)  Calcium 8.9 - 10.3 mg/dL 9.9 8.5(L) 7.9(L)  Total Protein 6.5 - 8.1 g/dL 8.8(H) 7.5 7.3  Total Bilirubin 0.3 - 1.2 mg/dL 0.4 0.4 0.3  Alkaline Phos 38 - 126 U/L 78 121 69  AST 15 - 41 U/L 25 20 33  ALT 17 - 63 U/L 18 20 24        PENDING LABS:    DIAGNOSTIC IMAGING:  Last CT chest/abd/pelvis: 12/20/16 CLINICAL DATA:  Metastatic prostate cancer.  EXAM: CT CHEST, ABDOMEN, AND PELVIS WITH CONTRAST  TECHNIQUE: Multidetector CT imaging of the chest, abdomen and pelvis was performed following the  standard protocol during bolus administration of intravenous contrast.  CONTRAST:  55mL ISOVUE-300 IOPAMIDOL (ISOVUE-300) INJECTION 61%  COMPARISON:  09/06/2016.  FINDINGS: CT CHEST FINDINGS  Cardiovascular: Right IJ Port-A-Cath terminates in the SVC. Three-vessel coronary artery calcification. Heart is enlarged. No pericardial effusion.  Mediastinum/Nodes: No pathologically enlarged mediastinal, hilar or axillary lymph nodes. Esophagus is grossly unremarkable.  Lungs/Pleura: Moderate centrilobular and mild paraseptal emphysema. There is interstitial and subpleural coarsening with mild traction bronchiectasis/bronchiolectasis, as on prior exams. No pleural fluid. Adherent debris in the trachea. Airway is otherwise unremarkable.  Musculoskeletal: Sclerotic lesions are seen throughout the visualized osseous structures, as before. Mild compression of the T3 vertebral body, unchanged.  CT ABDOMEN PELVIS FINDINGS  Hepatobiliary: Liver and gallbladder are unremarkable. No biliary ductal dilatation.  Pancreas: Negative.  Spleen: Negative.  Adrenals/Urinary Tract: Adrenal glands are unremarkable. Scarring in the right kidney. Left kidney is atrophic. Ureters are decompressed. Bladder is indented by the prostate.  Stomach/Bowel: Small hiatal hernia. Stomach, small bowel, appendix and colon are otherwise unremarkable.  Vascular/Lymphatic: Atherosclerotic calcification of the arterial vasculature without abdominal aortic aneurysm. No pathologically enlarged lymph nodes.  Reproductive: Prostate indents the bladder.  Other: Trace pelvic free fluid. Mesenteries and peritoneum are otherwise unremarkable.  Musculoskeletal: Sclerotic lesions are seen throughout the visualized osseous structures.  IMPRESSION: 1. Osseous metastatic disease, as before. No additional evidence of metastatic disease in the chest, abdomen or pelvis. 2. Aortic atherosclerosis  (ICD10-170.0). Three-vessel coronary artery calcification. 3.  Emphysema (ICD10-J43.9). 4. Pulmonary fibrotic changes, grossly stable, suggestive of nonspecific interstitial pneumonitis.   Electronically Signed   By: Lorin Picket M.D.  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  -Clinically, he is tolerating Jevtana very well; Previous PSA values since starting Jevtana 3.96, 3.92, & 3.42.   -PSA has been appropriately trending down, which supports good clinical response to therapy. Most recent PSA 1.07 on 02/11/17, which is a small increase from his previous PSA value of 0.89 in September. This may be related to the months where he did not get chemo since July 2018-October 2018 when he was resumed.  Will recheck his PSA level.   -Most recent restaging CT chest/abd/pelvis on 12/20/16 revealed stable osseous metastatic disease, but no new disease in the chest/abd/pelvis.  -Clinically patient feels well. -Return to cancer center every 3 weeks for Jevtana.  -Return to cancer center for follow-up in 6 weeks for follow-up.   Bone mets:  -Continue monthly Xgeva injections to help reduce risk of skeletal related events.  Low vitamin B12:  -Continue monthly vitamin B12 injections.   Acute on CKD: -Will give patient an extra 500NS bolus of fluids for his elevated creatinine. Encouraged PO hydration at home.   Dispo:  -Return early next week for [place] every 3 weeks. Continue monthly Xgeva.  -Return to cancer center in 6 weeks for follow-up visit.   All questions were answered to patient's stated satisfaction. Encouraged patient to call with any new concerns or questions before his next visit to the cancer center and we can certain see  him sooner, if needed.    Orders placed this encounter:  Orders Placed This Encounter  Procedures  . PSA   Twana First, MD

## 2017-03-28 NOTE — Progress Notes (Signed)
Labs reviewed with Dr. Talbert Cage, specifically BUN/creatinine. Order rec'd for NS 500 ml bolus IV.  Okay to tx today per MD.   Tolerated tx w/o adverse reaction.  Alert, in no distress.  VSS.  Neulasta OBI device filled per protocol and placed on left upper arm.  Needle/catheter placement noted prior to patient leaving. Tolerated without incident and aware of injection to be delivered in 27 hours. Discharged ambulatory.

## 2017-04-08 ENCOUNTER — Other Ambulatory Visit: Payer: Self-pay

## 2017-04-08 ENCOUNTER — Encounter (HOSPITAL_COMMUNITY): Payer: Self-pay

## 2017-04-08 ENCOUNTER — Encounter (HOSPITAL_COMMUNITY): Payer: Medicare Other

## 2017-04-08 ENCOUNTER — Encounter (HOSPITAL_BASED_OUTPATIENT_CLINIC_OR_DEPARTMENT_OTHER): Payer: Medicare Other

## 2017-04-08 DIAGNOSIS — C61 Malignant neoplasm of prostate: Secondary | ICD-10-CM | POA: Diagnosis not present

## 2017-04-08 DIAGNOSIS — M899 Disorder of bone, unspecified: Secondary | ICD-10-CM | POA: Diagnosis not present

## 2017-04-08 DIAGNOSIS — E538 Deficiency of other specified B group vitamins: Secondary | ICD-10-CM

## 2017-04-08 DIAGNOSIS — C7951 Secondary malignant neoplasm of bone: Secondary | ICD-10-CM | POA: Diagnosis present

## 2017-04-08 DIAGNOSIS — G893 Neoplasm related pain (acute) (chronic): Secondary | ICD-10-CM

## 2017-04-08 LAB — COMPREHENSIVE METABOLIC PANEL
ALBUMIN: 3.7 g/dL (ref 3.5–5.0)
ALT: 25 U/L (ref 17–63)
ANION GAP: 6 (ref 5–15)
AST: 22 U/L (ref 15–41)
Alkaline Phosphatase: 131 U/L — ABNORMAL HIGH (ref 38–126)
BUN: 21 mg/dL — ABNORMAL HIGH (ref 6–20)
CO2: 24 mmol/L (ref 22–32)
Calcium: 8.6 mg/dL — ABNORMAL LOW (ref 8.9–10.3)
Chloride: 107 mmol/L (ref 101–111)
Creatinine, Ser: 2.09 mg/dL — ABNORMAL HIGH (ref 0.61–1.24)
GFR calc non Af Amer: 30 mL/min — ABNORMAL LOW (ref >60)
GFR, EST AFRICAN AMERICAN: 35 mL/min — AB (ref >60)
GLUCOSE: 95 mg/dL (ref 65–99)
POTASSIUM: 4.8 mmol/L (ref 3.5–5.1)
SODIUM: 137 mmol/L (ref 135–145)
TOTAL PROTEIN: 7.4 g/dL (ref 6.5–8.1)
Total Bilirubin: 0.3 mg/dL (ref 0.3–1.2)

## 2017-04-08 LAB — CBC WITH DIFFERENTIAL/PLATELET
BASOS ABS: 0 10*3/uL (ref 0.0–0.1)
BASOS PCT: 0 %
Eosinophils Absolute: 0 10*3/uL (ref 0.0–0.7)
Eosinophils Relative: 0 %
HCT: 33 % — ABNORMAL LOW (ref 39.0–52.0)
HEMOGLOBIN: 10.6 g/dL — AB (ref 13.0–17.0)
LYMPHS PCT: 9 %
Lymphs Abs: 3.2 10*3/uL (ref 0.7–4.0)
MCH: 29.9 pg (ref 26.0–34.0)
MCHC: 32.1 g/dL (ref 30.0–36.0)
MCV: 93.2 fL (ref 78.0–100.0)
MONOS PCT: 4 %
Monocytes Absolute: 1.4 10*3/uL — ABNORMAL HIGH (ref 0.1–1.0)
NEUTROS ABS: 30.7 10*3/uL — AB (ref 1.7–7.7)
NEUTROS PCT: 87 %
PLATELETS: 305 10*3/uL (ref 150–400)
RBC: 3.54 MIL/uL — ABNORMAL LOW (ref 4.22–5.81)
RDW: 17.7 % — ABNORMAL HIGH (ref 11.5–15.5)
WBC: 35.3 10*3/uL — ABNORMAL HIGH (ref 4.0–10.5)

## 2017-04-08 LAB — PSA: Prostatic Specific Antigen: 0.89 ng/mL (ref 0.00–4.00)

## 2017-04-08 MED ORDER — CYANOCOBALAMIN 1000 MCG/ML IJ SOLN
1000.0000 ug | Freq: Once | INTRAMUSCULAR | Status: AC
  Administered 2017-04-08: 1000 ug via INTRAMUSCULAR

## 2017-04-08 MED ORDER — CYANOCOBALAMIN 1000 MCG/ML IJ SOLN
INTRAMUSCULAR | Status: AC
  Filled 2017-04-08: qty 1

## 2017-04-08 MED ORDER — OXYCODONE HCL 15 MG PO TABS: 15.0000 mg | ORAL_TABLET | ORAL | 0 refills | Status: DC | PRN

## 2017-04-08 MED ORDER — DENOSUMAB 120 MG/1.7ML ~~LOC~~ SOLN
120.0000 mg | Freq: Once | SUBCUTANEOUS | Status: AC
  Administered 2017-04-08: 120 mg via SUBCUTANEOUS
  Filled 2017-04-08: qty 1.7

## 2017-04-08 NOTE — Progress Notes (Signed)
Per Naida Sleight RPH, Corrected Calcium is 8.8.  Okay to proceed with treatment today.  Jeff [name] presents today for injection per MD orders. B12 1000 mcg administered IM in right deltoid. Xgeva 120 mg administered SQ in right lower abdomen. Administration without incident. Patient tolerated well. Patient tolerated treatment without incidence. Patient discharged ambulatory and in stable condition from clinic. Patient to follow up as scheduled.

## 2017-04-08 NOTE — Patient Instructions (Signed)
[place] at [facility] Discharge Instructions  RECOMMENDATIONS MADE BY THE CONSULTANT AND ANY TEST RESULTS WILL BE SENT TO YOUR REFERRING PHYSICIAN.  You received your Xgeva and B-12 injection today Follow up next month as scheduled.  Thank you for choosing [place] at [facility] to provide your oncology and hematology care.  To afford each patient quality time with our provider, please arrive at least 15 minutes before your scheduled appointment time.    If you have a lab appointment with the [place] please come in thru the  Main Entrance and check in at the main information desk  You need to re-schedule your appointment should you arrive 10 or more minutes late.  We strive to give you quality time with our providers, and arriving late affects you and other patients whose appointments are after yours.  Also, if you no show three or more times for appointments you may be dismissed from the clinic at the providers discretion.     Again, thank you for choosing [facility].  Our hope is that these requests will decrease the amount of time that you wait before being seen by our physicians.       _____________________________________________________________  Should you have questions after your visit to [facility], please contact our office at [phone] between the hours of 8:30 a.m. and 4:30 p.m.  Voicemails left after 4:30 p.m. will not be returned until the following business day.  For prescription refill requests, have your pharmacy contact our office.       Resources For Cancer Patients and their Caregivers ? American Cancer Society: Can assist with transportation, wigs, general needs, runs Look Good Feel Better.        936-523-1207 ? Cancer Care: Provides financial assistance, online support groups, medication/co-pay assistance.  1-800-813-HOPE (820)353-7183) ? [place] Assists [place] cancer patients and their families through emotional , educational and financial support.  431-384-3695 ? [place] DSS Where to apply for food stamps, Medicaid and utility assistance. 458-510-5997 ? RCATS: Transportation to medical appointments. 904-442-7139 ? Social Security Administration: May apply for disability if have a Stage IV cancer. [phone] 480-099-1603 ? [employer], Disability and Transit Services: Assists with nutrition, care and transit needs. [place] Support Programs: @10RELATIVEDAYS @ > Cancer Support Group  2nd Tuesday of the month 1pm-2pm, Journey Room  > Creative Journey  3rd Tuesday of the month 1130am-1pm, Journey Room  > Look Good Feel Better  1st Wednesday of the month 10am-12 noon, Journey Room (Call [place] to register [phone])

## 2017-04-20 ENCOUNTER — Encounter (HOSPITAL_COMMUNITY): Payer: Medicare Other

## 2017-04-20 ENCOUNTER — Encounter (HOSPITAL_COMMUNITY): Payer: Self-pay

## 2017-04-20 ENCOUNTER — Encounter: Payer: Self-pay | Admitting: Oncology

## 2017-04-20 ENCOUNTER — Encounter (HOSPITAL_COMMUNITY): Payer: Medicare Other | Attending: Adult Health

## 2017-04-20 ENCOUNTER — Encounter (HOSPITAL_COMMUNITY): Payer: Self-pay | Admitting: Adult Health

## 2017-04-20 VITALS — BP 122/77 | HR 50 | Temp 97.8°F | Resp 18 | Wt 167.0 lb

## 2017-04-20 DIAGNOSIS — C61 Malignant neoplasm of prostate: Secondary | ICD-10-CM

## 2017-04-20 DIAGNOSIS — C7951 Secondary malignant neoplasm of bone: Secondary | ICD-10-CM

## 2017-04-20 DIAGNOSIS — G893 Neoplasm related pain (acute) (chronic): Secondary | ICD-10-CM

## 2017-04-20 DIAGNOSIS — M899 Disorder of bone, unspecified: Secondary | ICD-10-CM | POA: Diagnosis not present

## 2017-04-20 DIAGNOSIS — Z5111 Encounter for antineoplastic chemotherapy: Secondary | ICD-10-CM

## 2017-04-20 LAB — COMPREHENSIVE METABOLIC PANEL WITH GFR
ALT: 20 U/L (ref 17–63)
AST: 24 U/L (ref 15–41)
Albumin: 4 g/dL (ref 3.5–5.0)
Alkaline Phosphatase: 69 U/L (ref 38–126)
Anion gap: 11 (ref 5–15)
BUN: 36 mg/dL — ABNORMAL HIGH (ref 6–20)
CO2: 23 mmol/L (ref 22–32)
Calcium: 9.9 mg/dL (ref 8.9–10.3)
Chloride: 102 mmol/L (ref 101–111)
Creatinine, Ser: 2.32 mg/dL — ABNORMAL HIGH (ref 0.61–1.24)
GFR calc Af Amer: 31 mL/min — ABNORMAL LOW (ref >60)
GFR calc non Af Amer: 27 mL/min — ABNORMAL LOW (ref >60)
Glucose, Bld: 77 mg/dL (ref 65–99)
Potassium: 4.4 mmol/L (ref 3.5–5.1)
Sodium: 136 mmol/L (ref 135–145)
Total Bilirubin: 0.4 mg/dL (ref 0.3–1.2)
Total Protein: 8.4 g/dL — ABNORMAL HIGH (ref 6.5–8.1)

## 2017-04-20 LAB — CBC WITH DIFFERENTIAL/PLATELET
Basophils Absolute: 0 K/uL (ref 0.0–0.1)
Basophils Relative: 0 %
Eosinophils Absolute: 0.1 K/uL (ref 0.0–0.7)
Eosinophils Relative: 1 %
HCT: 36.3 % — ABNORMAL LOW (ref 39.0–52.0)
Hemoglobin: 11.7 g/dL — ABNORMAL LOW (ref 13.0–17.0)
Lymphocytes Relative: 36 %
Lymphs Abs: 2.4 K/uL (ref 0.7–4.0)
MCH: 29.6 pg (ref 26.0–34.0)
MCHC: 32.2 g/dL (ref 30.0–36.0)
MCV: 91.9 fL (ref 78.0–100.0)
Monocytes Absolute: 0.9 K/uL (ref 0.1–1.0)
Monocytes Relative: 13 %
Neutro Abs: 3.4 K/uL (ref 1.7–7.7)
Neutrophils Relative %: 50 %
Platelets: 363 K/uL (ref 150–400)
RBC: 3.95 MIL/uL — ABNORMAL LOW (ref 4.22–5.81)
RDW: 17.2 % — ABNORMAL HIGH (ref 11.5–15.5)
WBC: 6.8 K/uL (ref 4.0–10.5)

## 2017-04-20 MED ORDER — FAMOTIDINE IN NACL 20-0.9 MG/50ML-% IV SOLN
20.0000 mg | Freq: Once | INTRAVENOUS | Status: AC
  Administered 2017-04-20: 20 mg via INTRAVENOUS
  Filled 2017-04-20: qty 50

## 2017-04-20 MED ORDER — HEPARIN SOD (PORK) LOCK FLUSH 100 UNIT/ML IV SOLN
500.0000 [IU] | Freq: Once | INTRAVENOUS | Status: AC | PRN
  Administered 2017-04-20: 500 [IU]
  Filled 2017-04-20 (×2): qty 5

## 2017-04-20 MED ORDER — DEXAMETHASONE SODIUM PHOSPHATE 10 MG/ML IJ SOLN
10.0000 mg | Freq: Once | INTRAMUSCULAR | Status: AC
  Administered 2017-04-20: 10 mg via INTRAVENOUS
  Filled 2017-04-20: qty 1

## 2017-04-20 MED ORDER — DIPHENHYDRAMINE HCL 50 MG/ML IJ SOLN
25.0000 mg | Freq: Once | INTRAMUSCULAR | Status: AC
  Administered 2017-04-20: 25 mg via INTRAVENOUS
  Filled 2017-04-20: qty 1

## 2017-04-20 MED ORDER — DEXTROSE 5 % IV SOLN
20.0000 mg/m2 | Freq: Once | INTRAVENOUS | Status: AC
  Administered 2017-04-20: 41 mg via INTRAVENOUS
  Filled 2017-04-20: qty 4.1

## 2017-04-20 MED ORDER — SODIUM CHLORIDE 0.9 % IV SOLN
Freq: Once | INTRAVENOUS | Status: AC
  Administered 2017-04-20: 11:00:00 via INTRAVENOUS

## 2017-04-20 MED ORDER — OXYCODONE HCL 15 MG PO TABS: 15.0000 mg | ORAL_TABLET | ORAL | 0 refills | Status: DC | PRN

## 2017-04-20 MED ORDER — SODIUM CHLORIDE 0.9% FLUSH: 10.0000 mL | INTRAVENOUS | Status: DC | PRN

## 2017-04-20 MED ORDER — PEGFILGRASTIM 6 MG/0.6ML ~~LOC~~ PSKT
6.0000 mg | PREFILLED_SYRINGE | Freq: Once | SUBCUTANEOUS | Status: AC
  Administered 2017-04-20: 6 mg via SUBCUTANEOUS
  Filled 2017-04-20: qty 0.6

## 2017-04-20 NOTE — Progress Notes (Signed)
Patient here for treatment requesting refill of Oxycodone  [place] Controlled Substance Reporting System reviewed and refill is appropriate on or after 04/20/17. Paper prescription printed & post-dated; Rx given to Anastasio Champion, RN who will given Rx to patient today.    NCCSRS reviewed:     Mike Craze, NP [place] [phone]

## 2017-04-20 NOTE — Progress Notes (Signed)
Tolerated infusion w/o adverse reaction.  Alert, in no distress.  VSS.  Discharged ambulatory.  

## 2017-05-06 ENCOUNTER — Other Ambulatory Visit: Payer: Self-pay

## 2017-05-06 ENCOUNTER — Inpatient Hospital Stay (HOSPITAL_COMMUNITY): Payer: Medicare Other | Attending: Oncology

## 2017-05-06 ENCOUNTER — Encounter (HOSPITAL_COMMUNITY): Payer: Self-pay

## 2017-05-06 ENCOUNTER — Inpatient Hospital Stay (HOSPITAL_COMMUNITY): Payer: Medicare Other

## 2017-05-06 VITALS — BP 118/66 | HR 72 | Temp 97.7°F | Resp 18 | Wt 172.8 lb

## 2017-05-06 DIAGNOSIS — C61 Malignant neoplasm of prostate: Secondary | ICD-10-CM | POA: Insufficient documentation

## 2017-05-06 DIAGNOSIS — C7951 Secondary malignant neoplasm of bone: Secondary | ICD-10-CM | POA: Diagnosis not present

## 2017-05-06 DIAGNOSIS — Z5189 Encounter for other specified aftercare: Secondary | ICD-10-CM | POA: Insufficient documentation

## 2017-05-06 DIAGNOSIS — E538 Deficiency of other specified B group vitamins: Secondary | ICD-10-CM

## 2017-05-06 DIAGNOSIS — G893 Neoplasm related pain (acute) (chronic): Secondary | ICD-10-CM

## 2017-05-06 DIAGNOSIS — Z5111 Encounter for antineoplastic chemotherapy: Secondary | ICD-10-CM | POA: Diagnosis not present

## 2017-05-06 DIAGNOSIS — M899 Disorder of bone, unspecified: Secondary | ICD-10-CM

## 2017-05-06 LAB — CBC WITH DIFFERENTIAL/PLATELET
BASOS ABS: 0 10*3/uL (ref 0.0–0.1)
BASOS PCT: 0 %
Eosinophils Absolute: 0 10*3/uL (ref 0.0–0.7)
Eosinophils Relative: 0 %
HEMATOCRIT: 29.7 % — AB (ref 39.0–52.0)
HEMOGLOBIN: 9.5 g/dL — AB (ref 13.0–17.0)
Lymphocytes Relative: 44 %
Lymphs Abs: 1.3 10*3/uL (ref 0.7–4.0)
MCH: 30 pg (ref 26.0–34.0)
MCHC: 32 g/dL (ref 30.0–36.0)
MCV: 93.7 fL (ref 78.0–100.0)
MONOS PCT: 18 %
Monocytes Absolute: 0.6 10*3/uL (ref 0.1–1.0)
NEUTROS ABS: 1.2 10*3/uL — AB (ref 1.7–7.7)
NEUTROS PCT: 38 %
Platelets: 296 10*3/uL (ref 150–400)
RBC: 3.17 MIL/uL — ABNORMAL LOW (ref 4.22–5.81)
RDW: 16.9 % — ABNORMAL HIGH (ref 11.5–15.5)
WBC: 3 10*3/uL — ABNORMAL LOW (ref 4.0–10.5)

## 2017-05-06 LAB — COMPREHENSIVE METABOLIC PANEL
ALBUMIN: 3.4 g/dL — AB (ref 3.5–5.0)
ALK PHOS: 50 U/L (ref 38–126)
ALT: 28 U/L (ref 17–63)
ANION GAP: 9 (ref 5–15)
AST: 36 U/L (ref 15–41)
BUN: 13 mg/dL (ref 6–20)
CALCIUM: 8.9 mg/dL (ref 8.9–10.3)
CHLORIDE: 106 mmol/L (ref 101–111)
CO2: 22 mmol/L (ref 22–32)
Creatinine, Ser: 1.87 mg/dL — ABNORMAL HIGH (ref 0.61–1.24)
GFR calc Af Amer: 40 mL/min — ABNORMAL LOW (ref >60)
GFR calc non Af Amer: 35 mL/min — ABNORMAL LOW (ref >60)
GLUCOSE: 100 mg/dL — AB (ref 65–99)
Potassium: 4.3 mmol/L (ref 3.5–5.1)
SODIUM: 137 mmol/L (ref 135–145)
Total Bilirubin: 0.1 mg/dL — ABNORMAL LOW (ref 0.3–1.2)
Total Protein: 7.4 g/dL (ref 6.5–8.1)

## 2017-05-06 MED ORDER — HEPARIN SOD (PORK) LOCK FLUSH 100 UNIT/ML IV SOLN
INTRAVENOUS | Status: AC
  Filled 2017-05-06: qty 5

## 2017-05-06 MED ORDER — ACETAMINOPHEN 325 MG PO TABS
ORAL_TABLET | ORAL | Status: AC
  Filled 2017-05-06: qty 1

## 2017-05-06 MED ORDER — OXYCODONE HCL 15 MG PO TABS: 15.0000 mg | ORAL_TABLET | ORAL | 0 refills | Status: DC | PRN

## 2017-05-06 MED ORDER — CYANOCOBALAMIN 1000 MCG/ML IJ SOLN
1000.0000 ug | Freq: Once | INTRAMUSCULAR | Status: AC
  Administered 2017-05-06: 1000 ug via INTRAMUSCULAR
  Filled 2017-05-06: qty 1

## 2017-05-06 MED ORDER — DENOSUMAB 120 MG/1.7ML ~~LOC~~ SOLN
120.0000 mg | Freq: Once | SUBCUTANEOUS | Status: AC
  Administered 2017-05-06: 120 mg via SUBCUTANEOUS
  Filled 2017-05-06: qty 1.7

## 2017-05-06 NOTE — Progress Notes (Signed)
Jeff [name] presents today for injection per MD orders. B12 1,03mcg administered IM  in right Upper Arm. Administration without incident. Patient tolerated well.   Jeff [name] presents today for injection per MD orders. Xgeva 120mg  administered SQ in right Abdomen. Administration without incident. Patient tolerated well.  Treatment given per orders. Patient tolerated it well without problems. Vitals stable and discharged home from clinic ambulatory. Follow up as scheduled.

## 2017-05-06 NOTE — Patient Instructions (Signed)
[place] at [facility] Discharge Instructions  RECOMMENDATIONS MADE BY THE CONSULTANT AND ANY TEST RESULTS WILL BE SENT TO YOUR REFERRING PHYSICIAN.  B12 injection, xgeva injection given today. Follow up as scheduled.  Thank you for choosing [place] at [facility] to provide your oncology and hematology care.  To afford each patient quality time with our provider, please arrive at least 15 minutes before your scheduled appointment time.    If you have a lab appointment with the [place] please come in thru the  Main Entrance and check in at the main information desk  You need to re-schedule your appointment should you arrive 10 or more minutes late.  We strive to give you quality time with our providers, and arriving late affects you and other patients whose appointments are after yours.  Also, if you no show three or more times for appointments you may be dismissed from the clinic at the providers discretion.     Again, thank you for choosing [facility].  Our hope is that these requests will decrease the amount of time that you wait before being seen by our physicians.       _____________________________________________________________  Should you have questions after your visit to [facility], please contact our office at [phone] between the hours of 8:30 a.m. and 4:30 p.m.  Voicemails left after 4:30 p.m. will not be returned until the following business day.  For prescription refill requests, have your pharmacy contact our office.       Resources For Cancer Patients and their Caregivers ? American Cancer Society: Can assist with transportation, wigs, general needs, runs Look Good Feel Better.        301-810-9005 ? Cancer Care: Provides financial assistance, online support groups, medication/co-pay assistance.  1-800-813-HOPE 587-200-1212) ? [place] Assists [place]  cancer patients and their families through emotional , educational and financial support.  9048039227 ? [place] DSS Where to apply for food stamps, Medicaid and utility assistance. 915-410-1216 ? RCATS: Transportation to medical appointments. 763-250-3482 ? Social Security Administration: May apply for disability if have a Stage IV cancer. [phone] 8676413765 ? [employer], Disability and Transit Services: Assists with nutrition, care and transit needs. [place] Support Programs: @10RELATIVEDAYS @ > Cancer Support Group  2nd Tuesday of the month 1pm-2pm, Journey Room  > Creative Journey  3rd Tuesday of the month 1130am-1pm, Journey Room  > Look Good Feel Better  1st Wednesday of the month 10am-12 noon, Journey Room (Call [place] to register [phone])

## 2017-05-11 ENCOUNTER — Inpatient Hospital Stay (HOSPITAL_COMMUNITY): Payer: Medicare Other

## 2017-05-11 ENCOUNTER — Inpatient Hospital Stay (HOSPITAL_BASED_OUTPATIENT_CLINIC_OR_DEPARTMENT_OTHER): Payer: Medicare Other | Admitting: Hematology and Oncology

## 2017-05-11 ENCOUNTER — Encounter (HOSPITAL_COMMUNITY): Payer: Self-pay

## 2017-05-11 ENCOUNTER — Ambulatory Visit (HOSPITAL_COMMUNITY): Payer: Medicare Other | Admitting: Hematology and Oncology

## 2017-05-11 VITALS — BP 112/71 | HR 60 | Temp 98.1°F | Resp 18 | Wt 173.6 lb

## 2017-05-11 DIAGNOSIS — C61 Malignant neoplasm of prostate: Secondary | ICD-10-CM

## 2017-05-11 DIAGNOSIS — Z5189 Encounter for other specified aftercare: Secondary | ICD-10-CM | POA: Diagnosis not present

## 2017-05-11 DIAGNOSIS — M899 Disorder of bone, unspecified: Secondary | ICD-10-CM

## 2017-05-11 DIAGNOSIS — C7951 Secondary malignant neoplasm of bone: Secondary | ICD-10-CM

## 2017-05-11 DIAGNOSIS — Z5111 Encounter for antineoplastic chemotherapy: Secondary | ICD-10-CM | POA: Diagnosis not present

## 2017-05-11 LAB — COMPREHENSIVE METABOLIC PANEL
ALBUMIN: 3.4 g/dL — AB (ref 3.5–5.0)
ALK PHOS: 43 U/L (ref 38–126)
ALT: 16 U/L — ABNORMAL LOW (ref 17–63)
ANION GAP: 9 (ref 5–15)
AST: 23 U/L (ref 15–41)
BUN: 17 mg/dL (ref 6–20)
CALCIUM: 8.4 mg/dL — AB (ref 8.9–10.3)
CHLORIDE: 106 mmol/L (ref 101–111)
CO2: 22 mmol/L (ref 22–32)
Creatinine, Ser: 1.9 mg/dL — ABNORMAL HIGH (ref 0.61–1.24)
GFR calc Af Amer: 40 mL/min — ABNORMAL LOW (ref >60)
GFR calc non Af Amer: 34 mL/min — ABNORMAL LOW (ref >60)
GLUCOSE: 110 mg/dL — AB (ref 65–99)
POTASSIUM: 4.4 mmol/L (ref 3.5–5.1)
SODIUM: 137 mmol/L (ref 135–145)
Total Bilirubin: 0.2 mg/dL — ABNORMAL LOW (ref 0.3–1.2)
Total Protein: 7.4 g/dL (ref 6.5–8.1)

## 2017-05-11 LAB — CBC WITH DIFFERENTIAL/PLATELET
BASOS PCT: 0 %
Basophils Absolute: 0 10*3/uL (ref 0.0–0.1)
EOS ABS: 0 10*3/uL (ref 0.0–0.7)
Eosinophils Relative: 0 %
HCT: 32.6 % — ABNORMAL LOW (ref 39.0–52.0)
HEMOGLOBIN: 10.2 g/dL — AB (ref 13.0–17.0)
Lymphocytes Relative: 45 %
Lymphs Abs: 1.8 10*3/uL (ref 0.7–4.0)
MCH: 29.3 pg (ref 26.0–34.0)
MCHC: 31.3 g/dL (ref 30.0–36.0)
MCV: 93.7 fL (ref 78.0–100.0)
MONOS PCT: 13 %
Monocytes Absolute: 0.5 10*3/uL (ref 0.1–1.0)
NEUTROS PCT: 42 %
Neutro Abs: 1.7 10*3/uL (ref 1.7–7.7)
PLATELETS: 333 10*3/uL (ref 150–400)
RBC: 3.48 MIL/uL — ABNORMAL LOW (ref 4.22–5.81)
RDW: 16.9 % — ABNORMAL HIGH (ref 11.5–15.5)
WBC: 4 10*3/uL (ref 4.0–10.5)

## 2017-05-11 MED ORDER — FAMOTIDINE IN NACL 20-0.9 MG/50ML-% IV SOLN
INTRAVENOUS | Status: AC
  Filled 2017-05-11: qty 50

## 2017-05-11 MED ORDER — DIPHENHYDRAMINE HCL 50 MG/ML IJ SOLN
25.0000 mg | Freq: Once | INTRAMUSCULAR | Status: AC
  Administered 2017-05-11: 25 mg via INTRAVENOUS
  Filled 2017-05-11: qty 1

## 2017-05-11 MED ORDER — PEGFILGRASTIM 6 MG/0.6ML ~~LOC~~ PSKT
6.0000 mg | PREFILLED_SYRINGE | Freq: Once | SUBCUTANEOUS | Status: AC
  Administered 2017-05-11: 6 mg via SUBCUTANEOUS
  Filled 2017-05-11: qty 0.6

## 2017-05-11 MED ORDER — DEXTROSE 5 % IV SOLN
20.0000 mg/m2 | Freq: Once | INTRAVENOUS | Status: AC
  Administered 2017-05-11: 41 mg via INTRAVENOUS
  Filled 2017-05-11: qty 4.1

## 2017-05-11 MED ORDER — SODIUM CHLORIDE 0.9 % IV SOLN
Freq: Once | INTRAVENOUS | Status: AC
  Administered 2017-05-11: 10:00:00 via INTRAVENOUS

## 2017-05-11 MED ORDER — SODIUM CHLORIDE 0.9% FLUSH
10.0000 mL | INTRAVENOUS | Status: DC | PRN
  Administered 2017-05-11: 10 mL
  Filled 2017-05-11: qty 10

## 2017-05-11 MED ORDER — DEXAMETHASONE SODIUM PHOSPHATE 10 MG/ML IJ SOLN
10.0000 mg | Freq: Once | INTRAMUSCULAR | Status: AC
  Administered 2017-05-11: 10 mg via INTRAVENOUS
  Filled 2017-05-11: qty 1

## 2017-05-11 MED ORDER — FAMOTIDINE IN NACL 20-0.9 MG/50ML-% IV SOLN
20.0000 mg | Freq: Once | INTRAVENOUS | Status: AC
  Administered 2017-05-11: 20 mg via INTRAVENOUS
  Filled 2017-05-11: qty 50

## 2017-05-11 MED ORDER — HEPARIN SOD (PORK) LOCK FLUSH 100 UNIT/ML IV SOLN
500.0000 [IU] | Freq: Once | INTRAVENOUS | Status: AC | PRN
  Administered 2017-05-11: 500 [IU]
  Filled 2017-05-11: qty 5

## 2017-05-11 NOTE — Patient Instructions (Signed)
[facility] Discharge Instructions for Patients Receiving Chemotherapy   Beginning January 23rd 2017 lab work for the [facility] will be done in the  Main lab at [facility] on 1st floor. If you have a lab appointment with the [facility] please come in thru the  Main Entrance and check in at the main information desk   Today you received the following chemotherapy agents Jevtana as well as Neulasta on-pro. Follow-up as scheduled. Call clinic for any questions or concerns  To help prevent nausea and vomiting after your treatment, we encourage you to take your nausea medication {CHL ONC    If you develop nausea and vomiting, or diarrhea that is not controlled by your medication, call the clinic.  The clinic phone number is [phone]. Office hours are Monday-Friday 8:30am-5:00pm.  BELOW ARE SYMPTOMS THAT SHOULD BE REPORTED IMMEDIATELY:  *FEVER GREATER THAN 101.0 F  *CHILLS WITH OR WITHOUT FEVER  NAUSEA AND VOMITING THAT IS NOT CONTROLLED WITH YOUR NAUSEA MEDICATION  *UNUSUAL SHORTNESS OF BREATH  *UNUSUAL BRUISING OR BLEEDING  TENDERNESS IN MOUTH AND THROAT WITH OR WITHOUT PRESENCE OF ULCERS  *URINARY PROBLEMS  *BOWEL PROBLEMS  UNUSUAL RASH Items with * indicate a potential emergency and should be followed up as soon as possible. If you have an emergency after office hours please contact your primary care physician or go to the nearest emergency department.  Please call the clinic during office hours if you have any questions or concerns.   You may also contact the Patient Navigator at [phone] should you have any questions or need assistance in obtaining follow up care.      Resources For Cancer Patients and their Caregivers ? American Cancer Society: Can assist with transportation, wigs, general needs, runs Look Good Feel Better.        281-296-9638 ? Cancer Care: Provides financial assistance, online support groups, medication/co-pay  assistance.  1-800-813-HOPE 406-418-4654) ? [place] Assists [place] cancer patients and their families through emotional , educational and financial support.  7626516880 ? [place] DSS Where to apply for food stamps, Medicaid and utility assistance. 762-090-7672 ? RCATS: Transportation to medical appointments. 604-281-3230 ? Social Security Administration: May apply for disability if have a Stage IV cancer. [phone] ? [employer], Disability and Transit Services: Assists with nutrition, care and transit needs. [phone]

## 2017-05-11 NOTE — Progress Notes (Signed)
0930 Lab results reviewed with and pt seen by Dr. Lebron Conners and pt approved for chemo tx today per MD                        Jeff [name] tolerated Jevtana infusion well without complaints or incident. Neulasta on-pro applied to pt's left arm with green indicator light flashing. VSS upon discharge. Pt discharged self ambulatory in satisfactory condition

## 2017-05-23 ENCOUNTER — Encounter (HOSPITAL_COMMUNITY): Payer: Self-pay | Admitting: Hematology and Oncology

## 2017-05-23 NOTE — Progress Notes (Signed)
[facility] Follow-up Visit:  Assessment: Prostate cancer metastatic to multiple sites 72 y.o. 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  Currently undergoing palliative systemic chemotherapy with cabazitaxel and supportive therapy with denosumab.  Most recent PSA demonstrates no evidence of disease progression.  Patient has no symptoms to suggest progressing disease either.  Plan: -Proceed with cycle #15 of cabazitaxel. -Proceed with denosumab today -Change Denosumab to every 6 weeks for convenience. -Return to clinic in 3 weeks with labs, clinic visit, and cabazitaxel cycle #16  Voice recognition software was used and creation of this note. Despite my best effort at editing the text, some misspelling/errors may have occurred.  Orders Placed This Encounter  Procedures  . CBC with Differential    Standing Status:   Future    Standing Expiration Date:   05/11/2018  . Comprehensive metabolic panel    Standing Status:   Future    Standing Expiration Date:   05/11/2018  . PSA    Standing Status:   Future    Standing Expiration Date:   05/11/2018    Cancer Staging No matching staging information was found for the patient.  All questions were answered.  . The patient knows to call the clinic with any problems, questions or concerns.  This note was electronically signed.    History of Presenting Illness Jeff [name] 72 y.o. 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  Patient presents for cycle #15 of his systemic chemotherapy.  In the interim, denies any new symptoms.  No fevers, chills, night sweats.  No progression of neuropathy.  Oncological/hematological History:   Prostate cancer metastatic to multiple sites ([place])   01/26/2014 Tumor Marker    PSA > 5000      01/28/2014 Initial  Biopsy    Metastatic adenocarcinoma of prostate      02/05/2014 Surgery    Bilateral orchiectomy by Dr. Junious Silk      02/26/2014 Tumor Marker    PSA = 399.5      03/14/2014 Tumor Marker    PSA= 89.51      03/14/2014 - 06/26/2014 Chemotherapy    Docetaxel 75 mg/kg every 21 days x 6 cycles      04/24/2014 Tumor Marker    PSA- 19.89      07/23/2014 Procedure    Nephrostomy tube removed by IR, Dr. Geroge Baseman      07/24/2014 Tumor Marker    PSA= 6.15      10/16/2014 Tumor Marker    PSA: 3.54       01/08/2015 Tumor Marker    PSA: 2.13       01/17/2015 Imaging    CT CAP-  Massive pelvic and retroperitoneal lymphadenopathy noted on the prior study has nearly completely resolved, now with only a small amount of residual amorphous soft tissue predominantly around the the infrarenal abdominal aorta.      01/17/2015 Imaging    Bone scan- Widespread osseous metastatic disease with multiple foci of increased activity throughout the skeleton, corresponding with the findings on the prior CT from October, 2015.      04/11/2015 Tumor Marker    PSA: 1.87       07/21/2015 Imaging    Bone scan- Bony metastatic disease again noted at multiple sites, stable from prior study. No progression of bony metastatic disease is demonstrable on this study.  07/25/2015 Imaging    CT CAP- Stable matted soft tissue density in the retroperitoneum and extraperitoneal pelvis consistent with treated disease. No recurrent lymphadenopathy.  No pulmonary metastatic disease. Diffuse stable sclerotic metastatic bone disease.      01/14/2016 Imaging    Bone scan-  Multiple sites of abnormal increased tracer localization involving BILATERAL ribs, T11, T12, questionably RIGHT scapula and LEFT iliac bone suspicious for osseous metastases.      01/23/2016 Imaging    CT CAP- 1. Similar widespread osseous metastasis. 2. Similar soft tissue thickening within the retroperitoneum of the abdomen. Improved left  pelvic side wall soft tissue thickening. These are consistent with sites of treated disease. No well-defined adenopathy. 3. No new sites of disease.      05/03/2016 -  Chemotherapy    Jevtana every 21 days       09/06/2016 Imaging    Restaging CT C/A/P: IMPRESSION: 1. Similar appearance of widespread sclerotic bone metastases. 2. No change in soft tissue thickening within the retroperitoneum and left pelvic sidewall. No well defined adenopathy identified. 3. No new sites of disease 4. Aortic Atherosclerosis (ICD10-I70.0). Coronary artery calcifications noted. 5. 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 6. Stable 9 mm pancreatic cystic lesion. Favor pseudocyst. Indolent neoplasm may look similar. Attention on follow-up imaging.      09/06/2016 Imaging    Bone Scan: IMPRESSION: In this patient with known diffuse sclerotic metastatic disease, bone scan does not reveal progression of radiotracer uptake. Several of the areas of previously demonstrated radiotracer uptake appear less prominent.  Change in appearance of radiotracer uptake involving the mandible now more notable on the right and previously more notable on the left may reflect result of dental disease rather than metastatic disease.       04/08/2017 Tumor Marker    PSA 0.89       Medical History: Past Medical History:  Diagnosis Date  . Acute renal failure ([place]) 01/26/2014  . Alcohol abuse   . Anemia of chronic disease 01/26/2014  . Bilateral hydronephrosis 01/26/2014  . History of cocaine abuse 01/26/2014  . Homelessness 01/31/2014  . Prostate cancer metastatic to multiple sites ([place]) 01/30/2014  . Sepsis ([place])   . UTI (lower urinary tract infection)     Surgical History: Past Surgical History:  Procedure Laterality Date  . ORCHIECTOMY Bilateral 02/05/2014   Procedure: BILATERAL ORCHIECTOMY;  Surgeon: Festus Aloe, MD;  Location: [facility] ORS;   Service: Urology;  Laterality: Bilateral;  . PERCUTANEOUS NEPHROSTOMY Bilateral    IR Dr. Junious Silk changed on 04/22/2014  . PORTACATH PLACEMENT Right 03/12/14    Family History: Family History  Problem Relation Age of Onset  . Cancer Brother 30    Social History: Social History   Socioeconomic History  . Marital status: Single    Spouse name: Not on file  . Number of children: 1  . Years of education: 9th  . Highest education level: Not on file  Social Needs  . Financial resource strain: Not on file  . Food insecurity - worry: Not on file  . Food insecurity - inability: Not on file  . Transportation needs - medical: Not on file  . Transportation needs - non-medical: Not on file  Occupational History  . Occupation: disabilty    Employer: [employer]  Tobacco Use  . Smoking status: Current Every Day Smoker    Packs/day: 0.25    Years: 55.00    Pack years: 13.75  Types: Cigarettes  . Smokeless tobacco: Never Used  Substance and Sexual Activity  . Alcohol use: No    Alcohol/week: 0.6 oz    Types: 1 Cans of beer per week    Comment: none for at least 7 -8 years  . Drug use: Yes    Types: Cocaine    Comment: last used Cocaine 01/25/14  . Sexual activity: Yes  Other Topics Concern  . Not on file  Social History Narrative   Lives at [employer] 9th grade   1 son, lives in [place], [place]   Can read and write in native language             Allergies: No Known Allergies  Medications:  Current Outpatient Medications  Medication Sig Dispense Refill  . Cabazitaxel (JEVTANA IV) Inject into the vein. Every 3 weeks    . calcium-vitamin D (OSCAL WITH D) 500-200 MG-UNIT tablet Take 2 tablets by mouth daily.    [name] lidocaine-prilocaine (EMLA) cream Apply a quarter size amount to port site 1 hour prior to chemo. Do not rub in. Cover with plastic wrap. 30 g 3  . metoCLOPramide (REGLAN) 5 MG tablet The day after chemo take 1 tab four times a day x 2  days. Then may take 1 tab four times a day if needed for nausea/vomiting. 60 tablet 2  . ondansetron (ZOFRAN) 8 MG tablet Take 1 tablet (8 mg total) by mouth every 8 (eight) hours as needed for nausea or vomiting. 30 tablet 2  . oxyCODONE (ROXICODONE) 15 MG immediate release tablet Take 1 tablet (15 mg total) by mouth every 4 (four) hours as needed for pain. 60 tablet 0  . predniSONE (DELTASONE) 10 MG tablet Take 1 tablet (10 mg total) by mouth daily with breakfast. 30 tablet 3  . prochlorperazine (COMPAZINE) 10 MG tablet The day after chemo take 1 tab four times a day x 2 days. Then may take 1 tab four times a day if needed for nausea/vomiting. 60 tablet 2   No current facility-administered medications for this visit.    Facility-Administered Medications Ordered in Other Visits  Medication Dose Route Frequency Provider Last Rate Last Dose  . cyanocobalamin ((VITAMIN B-12)) injection 1,000 mcg  1,000 mcg Intramuscular Once Kefalas, Thomas S, PA-C      . denosumab (XGEVA) injection 120 mg  120 mg Subcutaneous Once Kefalas, Thomas S, PA-C        Review of Systems: Review of Systems  All other systems reviewed and are negative.    PHYSICAL EXAMINATION There were no vitals taken for this visit.  ECOG PERFORMANCE STATUS: 1 - Symptomatic but completely ambulatory  Physical Exam  Constitutional: He is oriented to person, place, and time and well-developed, well-nourished, and in no distress. No distress.  HENT:  Head: Normocephalic and atraumatic.  Mouth/Throat: Oropharynx is clear and moist. No oropharyngeal exudate.  Eyes: Conjunctivae and EOM are normal. Pupils are equal, round, and reactive to light. No scleral icterus.  Neck: No thyromegaly present.  Cardiovascular: Normal rate, regular rhythm and normal heart sounds.  No murmur heard. Pulmonary/Chest: Effort normal and breath sounds normal. No respiratory distress. He has no wheezes. He has no rales.  Abdominal: Soft. Bowel sounds  are normal. He exhibits no distension and no mass. There is no tenderness. There is no guarding.  Musculoskeletal: He exhibits no edema.  Lymphadenopathy:    He has no cervical adenopathy.  Neurological: He is alert and oriented to  person, place, and time. He has normal reflexes. No cranial nerve deficit.  Skin: Skin is warm and dry. No rash noted. He is not diaphoretic. No erythema. No pallor.     LABORATORY DATA: I have personally reviewed the data as listed: Appointment on 05/11/2017  Component Date Value Ref Range Status  . WBC 05/11/2017 4.0  4.0 - 10.5 K/uL Final  . RBC 05/11/2017 3.48* 4.22 - 5.81 MIL/uL Final  . Hemoglobin 05/11/2017 10.2* 13.0 - 17.0 g/dL Final  . HCT 05/11/2017 32.6* 39.0 - 52.0 % Final  . MCV 05/11/2017 93.7  78.0 - 100.0 fL Final  . MCH 05/11/2017 29.3  26.0 - 34.0 pg Final  . MCHC 05/11/2017 31.3  30.0 - 36.0 g/dL Final  . RDW 05/11/2017 16.9* 11.5 - 15.5 % Final  . Platelets 05/11/2017 333  150 - 400 K/uL Final  . Neutrophils Relative % 05/11/2017 42  % Final  . Neutro Abs 05/11/2017 1.7  1.7 - 7.7 K/uL Final  . Lymphocytes Relative 05/11/2017 45  % Final  . Lymphs Abs 05/11/2017 1.8  0.7 - 4.0 K/uL Final  . Monocytes Relative 05/11/2017 13  % Final  . Monocytes Absolute 05/11/2017 0.5  0.1 - 1.0 K/uL Final  . Eosinophils Relative 05/11/2017 0  % Final  . Eosinophils Absolute 05/11/2017 0.0  0.0 - 0.7 K/uL Final  . Basophils Relative 05/11/2017 0  % Final  . Basophils Absolute 05/11/2017 0.0  0.0 - 0.1 K/uL Final  . Sodium 05/11/2017 137  135 - 145 mmol/L Final  . Potassium 05/11/2017 4.4  3.5 - 5.1 mmol/L Final  . Chloride 05/11/2017 106  101 - 111 mmol/L Final  . CO2 05/11/2017 22  22 - 32 mmol/L Final  . Glucose, Bld 05/11/2017 110* 65 - 99 mg/dL Final  . BUN 05/11/2017 17  6 - 20 mg/dL Final  . Creatinine, Ser 05/11/2017 1.90* 0.61 - 1.24 mg/dL Final  . Calcium 05/11/2017 8.4* 8.9 - 10.3 mg/dL Final  . Total Protein 05/11/2017 7.4  6.5 - 8.1  g/dL Final  . Albumin 05/11/2017 3.4* 3.5 - 5.0 g/dL Final  . AST 05/11/2017 23  15 - 41 U/L Final  . ALT 05/11/2017 16* 17 - 63 U/L Final  . Alkaline Phosphatase 05/11/2017 43  38 - 126 U/L Final  . Total Bilirubin 05/11/2017 0.2* 0.3 - 1.2 mg/dL Final  . GFR calc non Af Amer 05/11/2017 34* >60 mL/min Final  . GFR calc Af Amer 05/11/2017 40* >60 mL/min Final   Comment: (NOTE) The eGFR has been calculated using the CKD EPI equation. This calculation has not been validated in all clinical situations. eGFR's persistently <60 mL/min signify possible Chronic Kidney Disease.   . Anion gap 05/11/2017 9  5 - 15 Final  Appointment on 05/06/2017  Component Date Value Ref Range Status  . WBC 05/06/2017 3.0* 4.0 - 10.5 K/uL Final  . RBC 05/06/2017 3.17* 4.22 - 5.81 MIL/uL Final  . Hemoglobin 05/06/2017 9.5* 13.0 - 17.0 g/dL Final  . HCT 05/06/2017 29.7* 39.0 - 52.0 % Final  . MCV 05/06/2017 93.7  78.0 - 100.0 fL Final  . MCH 05/06/2017 30.0  26.0 - 34.0 pg Final  . MCHC 05/06/2017 32.0  30.0 - 36.0 g/dL Final  . RDW 05/06/2017 16.9* 11.5 - 15.5 % Final  . Platelets 05/06/2017 296  150 - 400 K/uL Final  . Neutrophils Relative % 05/06/2017 38  % Final  . Neutro Abs 05/06/2017 1.2* 1.7 -  7.7 K/uL Final  . Lymphocytes Relative 05/06/2017 44  % Final  . Lymphs Abs 05/06/2017 1.3  0.7 - 4.0 K/uL Final  . Monocytes Relative 05/06/2017 18  % Final  . Monocytes Absolute 05/06/2017 0.6  0.1 - 1.0 K/uL Final  . Eosinophils Relative 05/06/2017 0  % Final  . Eosinophils Absolute 05/06/2017 0.0  0.0 - 0.7 K/uL Final  . Basophils Relative 05/06/2017 0  % Final  . Basophils Absolute 05/06/2017 0.0  0.0 - 0.1 K/uL Final  . Sodium 05/06/2017 137  135 - 145 mmol/L Final  . Potassium 05/06/2017 4.3  3.5 - 5.1 mmol/L Final  . Chloride 05/06/2017 106  101 - 111 mmol/L Final  . CO2 05/06/2017 22  22 - 32 mmol/L Final  . Glucose, Bld 05/06/2017 100* 65 - 99 mg/dL Final  . BUN 05/06/2017 13  6 - 20 mg/dL Final   . Creatinine, Ser 05/06/2017 1.87* 0.61 - 1.24 mg/dL Final  . Calcium 05/06/2017 8.9  8.9 - 10.3 mg/dL Final  . Total Protein 05/06/2017 7.4  6.5 - 8.1 g/dL Final  . Albumin 05/06/2017 3.4* 3.5 - 5.0 g/dL Final  . AST 05/06/2017 36  15 - 41 U/L Final  . ALT 05/06/2017 28  17 - 63 U/L Final  . Alkaline Phosphatase 05/06/2017 50  38 - 126 U/L Final  . Total Bilirubin 05/06/2017 0.1* 0.3 - 1.2 mg/dL Final  . GFR calc non Af Amer 05/06/2017 35* >60 mL/min Final  . GFR calc Af Amer 05/06/2017 40* >60 mL/min Final   Comment: (NOTE) The eGFR has been calculated using the CKD EPI equation. This calculation has not been validated in all clinical situations. eGFR's persistently <60 mL/min signify possible Chronic Kidney Disease.   Georgiann Hahn gap 05/06/2017 9  5 - 15 Final       Ardath Sax, MD

## 2017-05-23 NOTE — Assessment & Plan Note (Signed)
72 y.o. 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  Currently undergoing palliative systemic chemotherapy with cabazitaxel and supportive therapy with denosumab.  Most recent PSA demonstrates no evidence of disease progression.  Patient has no symptoms to suggest progressing disease either.  Plan: -Proceed with cycle #15 of cabazitaxel. -Proceed with denosumab today -Change Denosumab to every 6 weeks for convenience. -Return to clinic in 3 weeks with labs, clinic visit, and cabazitaxel cycle #16

## 2017-06-01 ENCOUNTER — Inpatient Hospital Stay (HOSPITAL_COMMUNITY): Payer: Medicare Other | Attending: Oncology

## 2017-06-01 ENCOUNTER — Inpatient Hospital Stay (HOSPITAL_COMMUNITY): Payer: Medicare Other

## 2017-06-01 ENCOUNTER — Inpatient Hospital Stay (HOSPITAL_BASED_OUTPATIENT_CLINIC_OR_DEPARTMENT_OTHER): Payer: Medicare Other | Admitting: Internal Medicine

## 2017-06-01 ENCOUNTER — Other Ambulatory Visit (HOSPITAL_COMMUNITY): Payer: Self-pay

## 2017-06-01 VITALS — BP 110/65 | HR 71 | Temp 97.5°F | Resp 18 | Wt 166.2 lb

## 2017-06-01 DIAGNOSIS — C61 Malignant neoplasm of prostate: Secondary | ICD-10-CM

## 2017-06-01 DIAGNOSIS — M899 Disorder of bone, unspecified: Secondary | ICD-10-CM

## 2017-06-01 DIAGNOSIS — E538 Deficiency of other specified B group vitamins: Secondary | ICD-10-CM | POA: Diagnosis present

## 2017-06-01 DIAGNOSIS — C7951 Secondary malignant neoplasm of bone: Secondary | ICD-10-CM

## 2017-06-01 DIAGNOSIS — G893 Neoplasm related pain (acute) (chronic): Secondary | ICD-10-CM

## 2017-06-01 DIAGNOSIS — L0390 Cellulitis, unspecified: Secondary | ICD-10-CM | POA: Diagnosis not present

## 2017-06-01 LAB — CBC WITH DIFFERENTIAL/PLATELET
Basophils Absolute: 0 10*3/uL (ref 0.0–0.1)
Basophils Relative: 0 %
EOS PCT: 1 %
Eosinophils Absolute: 0.1 10*3/uL (ref 0.0–0.7)
HCT: 35.4 % — ABNORMAL LOW (ref 39.0–52.0)
HEMOGLOBIN: 11.4 g/dL — AB (ref 13.0–17.0)
LYMPHS ABS: 2 10*3/uL (ref 0.7–4.0)
LYMPHS PCT: 34 %
MCH: 30 pg (ref 26.0–34.0)
MCHC: 32.2 g/dL (ref 30.0–36.0)
MCV: 93.2 fL (ref 78.0–100.0)
MONOS PCT: 10 %
Monocytes Absolute: 0.6 10*3/uL (ref 0.1–1.0)
NEUTROS PCT: 55 %
Neutro Abs: 3.1 10*3/uL (ref 1.7–7.7)
Platelets: 320 10*3/uL (ref 150–400)
RBC: 3.8 MIL/uL — AB (ref 4.22–5.81)
RDW: 16.4 % — ABNORMAL HIGH (ref 11.5–15.5)
WBC: 5.8 10*3/uL (ref 4.0–10.5)

## 2017-06-01 LAB — COMPREHENSIVE METABOLIC PANEL
ALK PHOS: 68 U/L (ref 38–126)
ALT: 18 U/L (ref 17–63)
AST: 31 U/L (ref 15–41)
Albumin: 4.1 g/dL (ref 3.5–5.0)
Anion gap: 10 (ref 5–15)
BUN: 31 mg/dL — ABNORMAL HIGH (ref 6–20)
CALCIUM: 8.8 mg/dL — AB (ref 8.9–10.3)
CO2: 20 mmol/L — ABNORMAL LOW (ref 22–32)
CREATININE: 2.05 mg/dL — AB (ref 0.61–1.24)
Chloride: 104 mmol/L (ref 101–111)
GFR, EST AFRICAN AMERICAN: 36 mL/min — AB (ref >60)
GFR, EST NON AFRICAN AMERICAN: 31 mL/min — AB (ref >60)
Glucose, Bld: 124 mg/dL — ABNORMAL HIGH (ref 65–99)
Potassium: 4.8 mmol/L (ref 3.5–5.1)
Sodium: 134 mmol/L — ABNORMAL LOW (ref 135–145)
TOTAL PROTEIN: 8.3 g/dL — AB (ref 6.5–8.1)
Total Bilirubin: 0.5 mg/dL (ref 0.3–1.2)

## 2017-06-01 LAB — PSA: PROSTATIC SPECIFIC ANTIGEN: 0.83 ng/mL (ref 0.00–4.00)

## 2017-06-01 MED ORDER — CEPHALEXIN 500 MG PO CAPS: 500.0000 mg | ORAL_CAPSULE | Freq: Four times a day (QID) | ORAL | 0 refills | Status: DC

## 2017-06-01 MED ORDER — OXYCODONE HCL 15 MG PO TABS: 15.0000 mg | ORAL_TABLET | ORAL | 0 refills | Status: DC | PRN

## 2017-06-01 NOTE — Progress Notes (Signed)
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 Directly below this area leading to the port the skin is slightly pink and pt reports that the area is tender but the port itself looks good. Dr. Sherrine Maples notified and assessed the portacath site and ordered for chemo tx to be held today,surgical consult and antibiotic ordered per MD. Dr. Arnoldo Morale came up to see the pt and old port to be removed with new port placed in the next 2 weeks. Discussed this information with the pt who verbalized understanding and will return for possible tx in 3 weeks. Pt discharged self ambulatory in satisfactory condition

## 2017-06-01 NOTE — Addendum Note (Signed)
Addended by: Anselmo Rod on: 06/01/2017 03:34 PM   Modules accepted: Orders

## 2017-06-03 ENCOUNTER — Other Ambulatory Visit (HOSPITAL_COMMUNITY): Payer: Medicare Other

## 2017-06-03 ENCOUNTER — Ambulatory Visit (HOSPITAL_COMMUNITY): Payer: Medicare Other

## 2017-06-09 ENCOUNTER — Encounter (HOSPITAL_COMMUNITY)
Admission: RE | Admit: 2017-06-09 | Discharge: 2017-06-09 | Disposition: A | Discharge status: Home / Self Care | Payer: Medicare Other | Source: Ambulatory Visit | Attending: General Surgery | Admitting: General Surgery

## 2017-06-09 ENCOUNTER — Other Ambulatory Visit: Payer: Self-pay

## 2017-06-09 ENCOUNTER — Encounter: Payer: Self-pay | Admitting: General Surgery

## 2017-06-09 ENCOUNTER — Ambulatory Visit (INDEPENDENT_AMBULATORY_CARE_PROVIDER_SITE_OTHER): Payer: Medicare Other | Admitting: General Surgery

## 2017-06-09 ENCOUNTER — Encounter (HOSPITAL_COMMUNITY): Payer: Self-pay

## 2017-06-09 ENCOUNTER — Ambulatory Visit: Payer: Self-pay | Admitting: General Surgery

## 2017-06-09 VITALS — BP 105/69 | HR 89 | Temp 99.3°F | Ht 72.0 in | Wt 170.0 lb

## 2017-06-09 DIAGNOSIS — C61 Malignant neoplasm of prostate: Secondary | ICD-10-CM | POA: Diagnosis not present

## 2017-06-09 DIAGNOSIS — Z01812 Encounter for preprocedural laboratory examination: Secondary | ICD-10-CM | POA: Diagnosis present

## 2017-06-09 DIAGNOSIS — D638 Anemia in other chronic diseases classified elsewhere: Secondary | ICD-10-CM | POA: Insufficient documentation

## 2017-06-09 DIAGNOSIS — E538 Deficiency of other specified B group vitamins: Secondary | ICD-10-CM | POA: Insufficient documentation

## 2017-06-09 DIAGNOSIS — R7881 Bacteremia: Secondary | ICD-10-CM | POA: Insufficient documentation

## 2017-06-09 DIAGNOSIS — Z590 Homelessness: Secondary | ICD-10-CM | POA: Diagnosis not present

## 2017-06-09 DIAGNOSIS — N1330 Unspecified hydronephrosis: Secondary | ICD-10-CM | POA: Diagnosis not present

## 2017-06-09 DIAGNOSIS — E440 Moderate protein-calorie malnutrition: Secondary | ICD-10-CM | POA: Diagnosis not present

## 2017-06-09 DIAGNOSIS — A419 Sepsis, unspecified organism: Secondary | ICD-10-CM | POA: Diagnosis not present

## 2017-06-09 DIAGNOSIS — T80212A Local infection due to central venous catheter, initial encounter: Secondary | ICD-10-CM

## 2017-06-09 DIAGNOSIS — M899 Disorder of bone, unspecified: Secondary | ICD-10-CM | POA: Diagnosis not present

## 2017-06-09 DIAGNOSIS — Z87898 Personal history of other specified conditions: Secondary | ICD-10-CM | POA: Insufficient documentation

## 2017-06-09 DIAGNOSIS — N390 Urinary tract infection, site not specified: Secondary | ICD-10-CM | POA: Diagnosis not present

## 2017-06-09 LAB — RAPID URINE DRUG SCREEN, HOSP PERFORMED
Amphetamines: NOT DETECTED
BARBITURATES: NOT DETECTED
Benzodiazepines: NOT DETECTED
Cocaine: POSITIVE — AB
Opiates: NOT DETECTED
Tetrahydrocannabinol: NOT DETECTED

## 2017-06-09 LAB — BASIC METABOLIC PANEL
ANION GAP: 8 (ref 5–15)
BUN: 14 mg/dL (ref 6–20)
CALCIUM: 8.5 mg/dL — AB (ref 8.9–10.3)
CHLORIDE: 110 mmol/L (ref 101–111)
CO2: 21 mmol/L — ABNORMAL LOW (ref 22–32)
Creatinine, Ser: 1.89 mg/dL — ABNORMAL HIGH (ref 0.61–1.24)
GFR calc non Af Amer: 34 mL/min — ABNORMAL LOW (ref >60)
GFR, EST AFRICAN AMERICAN: 40 mL/min — AB (ref >60)
Glucose, Bld: 96 mg/dL (ref 65–99)
POTASSIUM: 4.8 mmol/L (ref 3.5–5.1)
Sodium: 139 mmol/L (ref 135–145)

## 2017-06-09 LAB — CBC
HCT: 33.1 % — ABNORMAL LOW (ref 39.0–52.0)
HEMOGLOBIN: 10.3 g/dL — AB (ref 13.0–17.0)
MCH: 29.4 pg (ref 26.0–34.0)
MCHC: 31.1 g/dL (ref 30.0–36.0)
MCV: 94.6 fL (ref 78.0–100.0)
Platelets: 368 10*3/uL (ref 150–400)
RBC: 3.5 MIL/uL — AB (ref 4.22–5.81)
RDW: 15.8 % — ABNORMAL HIGH (ref 11.5–15.5)
WBC: 6 10*3/uL (ref 4.0–10.5)

## 2017-06-09 NOTE — H&P (Signed)
Jeff [name]; [id number]; 1945-05-11   HPI Patient is a 72 year old black male who was referred to my care by the oncologist at [facility] for removal of an old Port-A-Cath and replacement.  Patient suffers from prostate cancer.  The tunneled catheter is starting to erode through the skin.  He was placed on antibiotics.  He now presents for removal of the old Port-A-Cath and placement of a new Port-A-Cath.  He currently has no pain.     Past Medical History:  Diagnosis Date  . Acute renal failure ([place]) 01/26/2014  . Alcohol abuse   . Anemia of chronic disease 01/26/2014  . Bilateral hydronephrosis 01/26/2014  . History of cocaine abuse 01/26/2014  . Homelessness 01/31/2014  . Prostate cancer metastatic to multiple sites ([place]) 01/30/2014  . Sepsis ([place])   . UTI (lower urinary tract infection)          Past Surgical History:  Procedure Laterality Date  . ORCHIECTOMY Bilateral 02/05/2014   Procedure: BILATERAL ORCHIECTOMY;  Surgeon: Festus Aloe, MD;  Location: [facility] ORS;  Service: Urology;  Laterality: Bilateral;  . PERCUTANEOUS NEPHROSTOMY Bilateral    IR Dr. Junious Silk changed on 04/22/2014  . PORTACATH PLACEMENT Right 03/12/14         Family History  Problem Relation Age of Onset  . Cancer Brother 6          Current Outpatient Medications on File Prior to Visit  Medication Sig Dispense Refill  . Cabazitaxel (JEVTANA IV) Inject into the vein. Every 3 weeks    . calcium-vitamin D (OSCAL WITH D) 500-200 MG-UNIT tablet Take 2 tablets by mouth daily.    . cephALEXin (KEFLEX) 500 MG capsule Take 1 capsule (500 mg total) by mouth 4 (four) times daily. 28 capsule 0  . lidocaine-prilocaine (EMLA) cream Apply a quarter size amount to port site 1 hour prior to chemo. Do not rub in. Cover with plastic wrap. 30 g 3  . metoCLOPramide (REGLAN) 5 MG tablet The day after chemo take 1 tab four times a day x 2 days. Then may take 1 tab four times a day if needed for  nausea/vomiting. 60 tablet 2  . ondansetron (ZOFRAN) 8 MG tablet Take 1 tablet (8 mg total) by mouth every 8 (eight) hours as needed for nausea or vomiting. 30 tablet 2  . oxyCODONE (ROXICODONE) 15 MG immediate release tablet Take 1 tablet (15 mg total) by mouth every 4 (four) hours as needed for pain. (Patient taking differently: Take 15-30 mg by mouth 2 (two) times daily as needed for pain (1-2 tablets depending on pain level). ) 60 tablet 0  . predniSONE (DELTASONE) 10 MG tablet Take 1 tablet (10 mg total) by mouth daily with breakfast. 30 tablet 3  . prochlorperazine (COMPAZINE) 10 MG tablet The day after chemo take 1 tab four times a day x 2 days. Then may take 1 tab four times a day if needed for nausea/vomiting. 60 tablet 2            Current Facility-Administered Medications on File Prior to Visit  Medication Dose Route Frequency Provider Last Rate Last Dose  . cyanocobalamin ((VITAMIN B-12)) injection 1,000 mcg  1,000 mcg Intramuscular Once Kefalas, Thomas S, PA-C      . denosumab (XGEVA) injection 120 mg  120 mg Subcutaneous Once Kefalas, Thomas S, PA-C        No Known Allergies  Social History       Substance and Sexual Activity  Alcohol Use  No  . Alcohol/week: 0.6 oz  . Types: 1 Cans of beer per week   Comment: none for at least 7 -8 years    Social History       Tobacco Use  Smoking Status Current Every Day Smoker  . Packs/day: 0.25  . Years: 55.00  . Pack years: 13.75  . Types: Cigarettes  Smokeless Tobacco Never Used    Review of Systems  Constitutional: Negative.   HENT: Negative.   Eyes: Negative.   Respiratory: Negative.   Cardiovascular: Negative.   Gastrointestinal: Negative.   Genitourinary: Negative.   Musculoskeletal: Negative.   Skin: Negative.   Neurological: Negative.   Endo/Heme/Allergies: Negative.   Psychiatric/Behavioral: Negative.     Objective      Vitals:   06/09/17 0916  BP: 105/69  Pulse: 89  Temp: 99.3 F  (37.4 C)    Physical Exam  Constitutional: He is oriented to person, place, and time and well-developed, well-nourished, and in no distress.  HENT:  Head: Normocephalic and atraumatic.  Cardiovascular: Normal rate, regular rhythm and normal heart sounds. Exam reveals no gallop and no friction rub.  No murmur heard. Pulmonary/Chest: Effort normal and breath sounds normal. No respiratory distress. He has no wheezes. He has no rales.    Port-A-Cath in place in the right upper chest with skin erosion along the tunneled track.  No purulent drainage present.  The port pocket is without fluctuance.  Neurological: He is alert and oriented to person, place, and time.  Vitals reviewed.   Assessment  Dysfunctional Port-A-Cath, prostate cancer Plan   Patient is scheduled for Port-A-Cath removal and placement of a new Port-A-Cath on 06/15/2017.  The risks and benefits of the procedure including bleeding, infection, and pneumothorax were fully explained to the patient, who gave informed consent.

## 2017-06-09 NOTE — Patient Instructions (Signed)
Jeff [name]  06/09/2017     @PREFPERIOPPHARMACY @   Your procedure is scheduled on 06/15/2017.  Report to [facility] at 6:50 A.M.  Call this number if you have problems the morning of surgery:  [phone]   Remember:  Do not eat food or drink liquids after midnight.  Take these medicines the morning of surgery with A SIP OF WATER Reglan, Zofran if needed, Oxy IR if needed, Prednisone   Do not wear jewelry, make-up or nail polish.  Do not wear lotions, powders, or perfumes, or deodorant.  Do not shave 48 hours prior to surgery.  Men may shave face and neck.  Do not bring valuables to the hospital.  [facility] is not responsible for any belongings or valuables.  Contacts, dentures or bridgework may not be worn into surgery.  Leave your suitcase in the car.  After surgery it may be brought to your room.  For patients admitted to the hospital, discharge time will be determined by your treatment team.  Patients discharged the day of surgery will not be allowed to drive home.    Please read over the following fact sheets that you were given. Surgical Site Infection Prevention and Anesthesia Post-op Instructions     PATIENT INSTRUCTIONS POST-ANESTHESIA  IMMEDIATELY FOLLOWING SURGERY:  Do not drive or operate machinery for the first twenty four hours after surgery.  Do not make any important decisions for twenty four hours after surgery or while taking narcotic pain medications or sedatives.  If you develop intractable nausea and vomiting or a severe headache please notify your doctor immediately.  FOLLOW-UP:  Please make an appointment with your surgeon as instructed. You do not need to follow up with anesthesia unless specifically instructed to do so.  WOUND CARE INSTRUCTIONS (if applicable):  Keep a dry clean dressing on the anesthesia/puncture wound site if there is drainage.  Once the wound has quit draining you may leave it open to air.  Generally you should leave the  bandage intact for twenty four hours unless there is drainage.  If the epidural site drains for more than 36-48 hours please call the anesthesia department.  QUESTIONS?:  Please feel free to call your physician or the hospital operator if you have any questions, and they will be happy to assist you.      Implanted Port Removal, Care After Refer to this sheet in the next few weeks. These instructions provide you with information about caring for yourself after your procedure. Your health care provider may also give you more specific instructions. Your treatment has been planned according to current medical practices, but problems sometimes occur. Call your health care provider if you have any problems or questions after your procedure. What can I expect after the procedure? After the procedure, it is common to have:  Soreness or pain near your incision.  Some swelling or bruising near your incision.  Follow these instructions at home: Medicines  Take over-the-counter and prescription medicines only as told by your health care provider.  If you were prescribed an antibiotic medicine, take it as told by your health care provider. Do not stop taking the antibiotic even if you start to feel better. Bathing  Do not take baths, swim, or use a hot tub until your health care provider approves. Ask your health care provider if you can take showers. You may only be allowed to take sponge baths for bathing. Incision care  Follow instructions from your health care provider about  how to take care of your incision. Make sure you: ? Wash your hands with soap and water before you change your bandage (dressing). If soap and water are not available, use hand sanitizer. ? Change your dressing as told by your health care provider. ? Keep your dressing dry. ? Leave stitches (sutures), skin glue, or adhesive strips in place. These skin closures may need to stay in place for 2 weeks or longer. If adhesive strip  edges start to loosen and curl up, you may trim the loose edges. Do not remove adhesive strips completely unless your health care provider tells you to do that.  Check your incision area every day for signs of infection. Check for: ? More redness, swelling, or pain. ? More fluid or blood. ? Warmth. ? Pus or a bad smell. Driving  If you received a sedative, do not drive for 24 hours after the procedure.  If you did not receive a sedative, ask your health care provider when it is safe to drive. Activity  Return to your normal activities as told by your health care provider. Ask your health care provider what activities are safe for you.  Until your health care provider says it is safe: ? Do not lift anything that is heavier than 10 lb (4.5 kg). ? Do not do activities that involve lifting your arms over your head. General instructions  Do not use any tobacco products, such as cigarettes, chewing tobacco, and e-cigarettes. Tobacco can delay healing. If you need help quitting, ask your health care provider.  Keep all follow-up visits as told by your health care provider. This is important. Contact a health care provider if:  You have more redness, swelling, or pain around your incision.  You have more fluid or blood coming from your incision.  Your incision feels warm to the touch.  You have pus or a bad smell coming from your incision.  You have a fever.  You have pain that is not relieved by your pain medicine. Get help right away if:  You have chest pain.  You have difficulty breathing. This information is not intended to replace advice given to you by your health care provider. Make sure you discuss any questions you have with your health care provider. Document Released: 03/24/2015 Document Revised: 09/18/2015 Document Reviewed: 01/15/2015 Elsevier Interactive Patient Education  2018 [employer] Insertion Implanted port insertion is a procedure to put  in a port and catheter. The port is a device with an injectable disk that can be accessed by your health care provider. The port is connected to a vein in the chest or neck by a small flexible tube (catheter). There are different types of ports. The implanted port may be used as a long-term IV access for:  Medicines, such as chemotherapy.  Fluids.  Liquid nutrition, such as total parenteral nutrition (TPN).  Blood samples.  Having a port means that your health care provider will not need to use the veins in your arms for these procedures. Tell a health care provider about:  Any allergies you have.  All medicines you are taking, especially blood thinners, as well as any vitamins, herbs, eye drops, creams, over-the-counter medicines, and steroids.  Any problems you or family members have had with anesthetic medicines.  Any blood disorders you have.  Any surgeries you have had.  Any medical conditions you have, including diabetes or kidney problems.  Whether you are pregnant or may be pregnant. What are the  risks? Generally, this is a safe procedure. However, problems may occur, including:  Allergic reactions to medicines or dyes.  Damage to other structures or organs.  Infection.  Damage to the blood vessel, bruising, or bleeding at the puncture site.  Blood clot.  Breakdown of the skin over the port.  A collection of air in the chest that can cause one of the lungs to collapse (pneumothorax). This is rare.  What happens before the procedure? Staying hydrated Follow instructions from your health care provider about hydration, which may include:  Up to 2 hours before the procedure - you may continue to drink clear liquids, such as water, clear fruit juice, black coffee, and plain tea.  Eating and drinking restrictions  Follow instructions from your health care provider about eating and drinking, which may include: ? 8 hours before the procedure - stop eating heavy  meals or foods such as meat, fried foods, or fatty foods. ? 6 hours before the procedure - stop eating light meals or foods, such as toast or cereal. ? 6 hours before the procedure - stop drinking milk or drinks that contain milk. ? 2 hours before the procedure - stop drinking clear liquids. Medicines  Ask your health care provider about: ? Changing or stopping your regular medicines. This is especially important if you are taking diabetes medicines or blood thinners. ? Taking medicines such as aspirin and ibuprofen. These medicines can thin your blood. Do not take these medicines before your procedure if your health care provider instructs you not to.  You may be given antibiotic medicine to help prevent infection. General instructions  Plan to have someone take you home from the hospital or clinic.  If you will be going home right after the procedure, plan to have someone with you for 24 hours.  You may have blood tests.  You may be asked to shower with a germ-killing soap. What happens during the procedure?  To lower your risk of infection: ? Your health care team will wash or sanitize their hands. ? Your skin will be washed with soap. ? Hair may be removed from the surgical area.  An IV tube will be inserted into one of your veins.  You will be given one or more of the following: ? A medicine to help you relax (sedative). ? A medicine to numb the area (local anesthetic).  Two small cuts (incisions) will be made to insert the port. ? One incision will be made in your neck to get access to the vein where the catheter will lie. ? The other incision will be made in the upper chest. This is where the port will lie.  The procedure may be done using continuous X-ray (fluoroscopy) or other imaging tools for guidance.  The port and catheter will be placed. There may be a small, raised area where the port is.  The port will be flushed with a salt solution (saline), and blood will  be drawn to make sure that it is working correctly.  The incisions will be closed.  Bandages (dressings) may be placed over the incisions. The procedure may vary among health care providers and hospitals. What happens after the procedure?  Your blood pressure, heart rate, breathing rate, and blood oxygen level will be monitored until the medicines you were given have worn off.  Do not drive for 24 hours if you were given a sedative.  You will be given a manufacturer's information card for the type of port that  you have. Keep this with you.  Your port will need to be flushed and checked as told by your health care provider, usually every few weeks.  A chest X-ray will be done to: ? Check the placement of the port. ? Make sure there is no injury to your lung. Summary  Implanted port insertion is a procedure to put in a port and catheter.  The implanted port is used as a long-term IV access.  The port will need to be flushed and checked as told by your health care provider, usually every few weeks.  Keep your manufacturer's information card with you at all times. This information is not intended to replace advice given to you by your health care provider. Make sure you discuss any questions you have with your health care provider. Document Released: 01/31/2013 Document Revised: 03/03/2016 Document Reviewed: 03/03/2016 Elsevier Interactive Patient Education  2017 [employer].

## 2017-06-09 NOTE — Patient Instructions (Signed)
Implanted Port Home Guide An implanted port is a type of central line that is placed under the skin. Central lines are used to provide IV access when treatment or nutrition needs to be given through a person's veins. Implanted ports are used for long-term IV access. An implanted port may be placed because:  You need IV medicine that would be irritating to the small veins in your hands or arms.  You need long-term IV medicines, such as antibiotics.  You need IV nutrition for a long period.  You need frequent blood draws for lab tests.  You need dialysis.  Implanted ports are usually placed in the chest area, but they can also be placed in the upper arm, the abdomen, or the leg. An implanted port has two main parts:  Reservoir. The reservoir is round and will appear as a small, raised area under your skin. The reservoir is the part where a needle is inserted to give medicines or draw blood.  Catheter. The catheter is a thin, flexible tube that extends from the reservoir. The catheter is placed into a large vein. Medicine that is inserted into the reservoir goes into the catheter and then into the vein.  How will I care for my incision site? Do not get the incision site wet. Bathe or shower as directed by your health care provider. How is my port accessed? Special steps must be taken to access the port:  Before the port is accessed, a numbing cream can be placed on the skin. This helps numb the skin over the port site.  Your health care provider uses a sterile technique to access the port. ? Your health care provider must put on a mask and sterile gloves. ? The skin over your port is cleaned carefully with an antiseptic and allowed to dry. ? The port is gently pinched between sterile gloves, and a needle is inserted into the port.  Only "non-coring" port needles should be used to access the port. Once the port is accessed, a blood return should be checked. This helps ensure that the port  is in the vein and is not clogged.  If your port needs to remain accessed for a constant infusion, a clear (transparent) bandage will be placed over the needle site. The bandage and needle will need to be changed every week, or as directed by your health care provider.  Keep the bandage covering the needle clean and dry. Do not get it wet. Follow your health care provider's instructions on how to take a shower or bath while the port is accessed.  If your port does not need to stay accessed, no bandage is needed over the port.  What is flushing? Flushing helps keep the port from getting clogged. Follow your health care provider's instructions on how and when to flush the port. Ports are usually flushed with saline solution or a medicine called heparin. The need for flushing will depend on how the port is used.  If the port is used for intermittent medicines or blood draws, the port will need to be flushed: ? After medicines have been given. ? After blood has been drawn. ? As part of routine maintenance.  If a constant infusion is running, the port may not need to be flushed.  How long will my port stay implanted? The port can stay in for as long as your health care provider thinks it is needed. When it is time for the port to come out, surgery will be   done to remove it. The procedure is similar to the one performed when the port was put in. When should I seek immediate medical care? When you have an implanted port, you should seek immediate medical care if:  You notice a bad smell coming from the incision site.  You have swelling, redness, or drainage at the incision site.  You have more swelling or pain at the port site or the surrounding area.  You have a fever that is not controlled with medicine.  This information is not intended to replace advice given to you by your health care provider. Make sure you discuss any questions you have with your health care provider. Document  Released: 04/12/2005 Document Revised: 09/18/2015 Document Reviewed: 12/18/2012 Elsevier Interactive Patient Education  2017 [employer]  

## 2017-06-09 NOTE — Progress Notes (Signed)
Jeff [name]; [id number]; June 23, 1945   HPI Patient is a 72 year old black male who was referred to my care by the oncologist at [facility] for removal of an old Port-A-Cath and replacement.  Patient suffers from prostate cancer.  The tunneled catheter is starting to erode through the skin.  He was placed on antibiotics.  He now presents for removal of the old Port-A-Cath and placement of a new Port-A-Cath.  He currently has no pain. Past Medical History:  Diagnosis Date  . Acute renal failure ([place]) 01/26/2014  . Alcohol abuse   . Anemia of chronic disease 01/26/2014  . Bilateral hydronephrosis 01/26/2014  . History of cocaine abuse 01/26/2014  . Homelessness 01/31/2014  . Prostate cancer metastatic to multiple sites ([place]) 01/30/2014  . Sepsis ([place])   . UTI (lower urinary tract infection)     Past Surgical History:  Procedure Laterality Date  . ORCHIECTOMY Bilateral 02/05/2014   Procedure: BILATERAL ORCHIECTOMY;  Surgeon: Festus Aloe, MD;  Location: [facility];  Service: Urology;  Laterality: Bilateral;  . PERCUTANEOUS NEPHROSTOMY Bilateral    IR Dr. Junious Silk changed on 04/22/2014  . PORTACATH PLACEMENT Right 03/12/14    Family History  Problem Relation Age of Onset  . Cancer Brother 72    Current Outpatient Medications on File Prior to Visit  Medication Sig Dispense Refill  . Cabazitaxel (JEVTANA IV) Inject into the vein. Every 3 weeks    . calcium-vitamin D (OSCAL WITH D) 500-200 MG-UNIT tablet Take 2 tablets by mouth daily.    . cephALEXin (KEFLEX) 500 MG capsule Take 1 capsule (500 mg total) by mouth 4 (four) times daily. 28 capsule 0  . lidocaine-prilocaine (EMLA) cream Apply a quarter size amount to port site 1 hour prior to chemo. Do not rub in. Cover with plastic wrap. 30 g 3  . metoCLOPramide (REGLAN) 5 MG tablet The day after chemo take 1 tab four times a day x 2 days. Then may take 1 tab four times a day if needed for nausea/vomiting. 60 tablet 2  . ondansetron  (ZOFRAN) 8 MG tablet Take 1 tablet (8 mg total) by mouth every 8 (eight) hours as needed for nausea or vomiting. 30 tablet 2  . oxyCODONE (ROXICODONE) 15 MG immediate release tablet Take 1 tablet (15 mg total) by mouth every 4 (four) hours as needed for pain. (Patient taking differently: Take 15-30 mg by mouth 2 (two) times daily as needed for pain (1-2 tablets depending on pain level). ) 60 tablet 0  . predniSONE (DELTASONE) 10 MG tablet Take 1 tablet (10 mg total) by mouth daily with breakfast. 30 tablet 3  . prochlorperazine (COMPAZINE) 10 MG tablet The day after chemo take 1 tab four times a day x 2 days. Then may take 1 tab four times a day if needed for nausea/vomiting. 60 tablet 2   Current Facility-Administered Medications on File Prior to Visit  Medication Dose Route Frequency Provider Last Rate Last Dose  . cyanocobalamin ((VITAMIN B-12)) injection 1,000 mcg  1,000 mcg Intramuscular Once Kefalas, Thomas S, PA-C      . denosumab (XGEVA) injection 120 mg  120 mg Subcutaneous Once Kefalas, Thomas S, PA-C        No Known Allergies  Social History   Substance and Sexual Activity  Alcohol Use No  . Alcohol/week: 0.6 oz  . Types: 1 Cans of beer per week   Comment: none for at least 7 -8 years    Social History   Tobacco  Use  Smoking Status Current Every Day Smoker  . Packs/day: 0.25  . Years: 55.00  . Pack years: 13.75  . Types: Cigarettes  Smokeless Tobacco Never Used    Review of Systems  Constitutional: Negative.   HENT: Negative.   Eyes: Negative.   Respiratory: Negative.   Cardiovascular: Negative.   Gastrointestinal: Negative.   Genitourinary: Negative.   Musculoskeletal: Negative.   Skin: Negative.   Neurological: Negative.   Endo/Heme/Allergies: Negative.   Psychiatric/Behavioral: Negative.     Objective   Vitals:   06/09/17 0916  BP: 105/69  Pulse: 89  Temp: 99.3 F (37.4 C)    Physical Exam  Constitutional: He is oriented to person, place, and  time and well-developed, well-nourished, and in no distress.  HENT:  Head: Normocephalic and atraumatic.  Cardiovascular: Normal rate, regular rhythm and normal heart sounds. Exam reveals no gallop and no friction rub.  No murmur heard. Pulmonary/Chest: Effort normal and breath sounds normal. No respiratory distress. He has no wheezes. He has no rales.    Port-A-Cath in place in the right upper chest with skin erosion along the tunneled track.  No purulent drainage present.  The port pocket is without fluctuance.  Neurological: He is alert and oriented to person, place, and time.  Vitals reviewed.   Assessment  Dysfunctional Port-A-Cath, prostate cancer Plan   Patient is scheduled for Port-A-Cath removal and placement of a new Port-A-Cath on 06/15/2017.  The risks and benefits of the procedure including bleeding, infection, and pneumothorax were fully explained to the patient, who gave informed consent.

## 2017-06-09 NOTE — Progress Notes (Signed)
[facility] follow-up note: 06/01/2017  Chief complaint: I was asked to see this gentleman due to concerns about his Port-A-Cath.    HPI: He is here for cycle 16-day 1 of chemotherapy with cabazitaxel for metastatic prostate cancer.  Patient notes erosion of the skin over the surface of the tunnel where the port traverses above the clavicle.  He is having hard time with his backpack strap rubbing on the surface of it.  He also noted slight redness and some pain at the port site.  He denies any fever he denies any other complaints such as cough bone pains.    Blood pressure 110/65, pulse 71, temperature (!) 97.5 F (36.4 C), temperature source Oral, resp. rate 18, weight 166 lb 3.2 oz (75.4 kg), SpO2 100 %. CBC Hemoglobin is 11.4 platelets 320 white cell count is 5.8.  cmp shows cr 2.05 stable, PSA pending  Exam shows a dark scabbed area along his Port-A-Cath tubing line about his clavicle directly below this area leading to the port his skin is slightly pink and that spirits slightly tender.  The port is functioning.  Impression and plan:  Mild cellulitis around the Port-A-Cath.   Erosion of skin over the tunnel about the clavicle.    Plan is to hold chemotherapy today we will get general surgery to see if his port.   He will likely need a new Port-A-Cath.   We will give him a prescription for Keflex for 7 days.   Patient was asked to contact us for any fevers. We will see him back within the next 2 weeks after the new Port-A-Cath is inserted for resuming chemotherapy.

## 2017-06-10 ENCOUNTER — Telehealth (HOSPITAL_COMMUNITY): Payer: Self-pay

## 2017-06-10 ENCOUNTER — Encounter (HOSPITAL_COMMUNITY): Payer: Self-pay | Admitting: Adult Health

## 2017-06-10 NOTE — Progress Notes (Signed)
Dr. Arnoldo Morale came up to cancer center this morning to provide update on [name].  Pt was recently seen in Dr. Arnoldo Morale' clinic for consideration of port-a-cath exchange d/t concerns for possible port infection.    Urine drug screen was performed on 06/09/17; pt tested positive for cocaine.  Therefore, he cannot undergo port-a-cath surgery for at least 2 weeks/until he tests negative for illicit drugs.    Given that he tested positive for cocaine, we will no longer prescribe controlled substances for this patient.  I will ask nursing to call patient and let him know.      Mike Craze, NP [place] [phone]

## 2017-06-10 NOTE — Progress Notes (Signed)
Called and left patient message on his voicemail that the cancer center will no longer be prescribing narcotics for him since he tested positive for cocaine on last drug screen. If he has any questions, call cancer center.

## 2017-06-10 NOTE — Telephone Encounter (Signed)
-----   Message from Holley Bouche, NP sent at 06/10/2017  2:51 PM EST ----- Jenn/Amberly Livas: Will you please call patient and let him know that we will no longer be prescribing controlled substances for him.  Please also update the spreadsheet to include his name.   Others: Please be aware that if he calls for refills, we are no longer providing pain medication refills for him.   gwd

## 2017-06-10 NOTE — Pre-Procedure Instructions (Signed)
Drug screen results given to Dr Arnoldo Morale.

## 2017-06-10 NOTE — Telephone Encounter (Signed)
Called and left patient message on his voicemail that the cancer center will no longer be prescribing narcotics for him since he tested positive for cocaine on last drug screen. If he has any questions, call cancer center.

## 2017-06-17 ENCOUNTER — Other Ambulatory Visit (HOSPITAL_COMMUNITY): Payer: Self-pay | Admitting: *Deleted

## 2017-06-17 DIAGNOSIS — C61 Malignant neoplasm of prostate: Secondary | ICD-10-CM

## 2017-06-20 ENCOUNTER — Other Ambulatory Visit: Payer: Self-pay

## 2017-06-20 ENCOUNTER — Encounter (HOSPITAL_COMMUNITY): Payer: Self-pay

## 2017-06-20 ENCOUNTER — Inpatient Hospital Stay (HOSPITAL_COMMUNITY): Payer: Medicare Other

## 2017-06-20 VITALS — BP 126/85 | HR 78 | Temp 97.7°F | Resp 18

## 2017-06-20 DIAGNOSIS — C61 Malignant neoplasm of prostate: Secondary | ICD-10-CM | POA: Diagnosis not present

## 2017-06-20 DIAGNOSIS — E538 Deficiency of other specified B group vitamins: Secondary | ICD-10-CM

## 2017-06-20 DIAGNOSIS — M899 Disorder of bone, unspecified: Secondary | ICD-10-CM

## 2017-06-20 LAB — CBC WITH DIFFERENTIAL/PLATELET
BASOS ABS: 0 10*3/uL (ref 0.0–0.1)
BASOS PCT: 0 %
EOS PCT: 2 %
Eosinophils Absolute: 0.1 10*3/uL (ref 0.0–0.7)
HCT: 36.2 % — ABNORMAL LOW (ref 39.0–52.0)
Hemoglobin: 11.3 g/dL — ABNORMAL LOW (ref 13.0–17.0)
LYMPHS PCT: 27 %
Lymphs Abs: 1 10*3/uL (ref 0.7–4.0)
MCH: 29.7 pg (ref 26.0–34.0)
MCHC: 31.2 g/dL (ref 30.0–36.0)
MCV: 95 fL (ref 78.0–100.0)
MONO ABS: 0.4 10*3/uL (ref 0.1–1.0)
Monocytes Relative: 9 %
Neutro Abs: 2.5 10*3/uL (ref 1.7–7.7)
Neutrophils Relative %: 62 %
Platelets: 358 10*3/uL (ref 150–400)
RBC: 3.81 MIL/uL — ABNORMAL LOW (ref 4.22–5.81)
RDW: 15 % (ref 11.5–15.5)
WBC: 3.9 10*3/uL — ABNORMAL LOW (ref 4.0–10.5)

## 2017-06-20 LAB — COMPREHENSIVE METABOLIC PANEL
ALT: 12 U/L — ABNORMAL LOW (ref 17–63)
AST: 20 U/L (ref 15–41)
Albumin: 3.8 g/dL (ref 3.5–5.0)
Alkaline Phosphatase: 53 U/L (ref 38–126)
Anion gap: 6 (ref 5–15)
BUN: 12 mg/dL (ref 6–20)
CO2: 24 mmol/L (ref 22–32)
Calcium: 8.7 mg/dL — ABNORMAL LOW (ref 8.9–10.3)
Chloride: 105 mmol/L (ref 101–111)
Creatinine, Ser: 1.64 mg/dL — ABNORMAL HIGH (ref 0.61–1.24)
GFR calc Af Amer: 47 mL/min — ABNORMAL LOW (ref >60)
GFR, EST NON AFRICAN AMERICAN: 41 mL/min — AB (ref >60)
Glucose, Bld: 105 mg/dL — ABNORMAL HIGH (ref 65–99)
Potassium: 4.9 mmol/L (ref 3.5–5.1)
Sodium: 135 mmol/L (ref 135–145)
Total Bilirubin: 0.3 mg/dL (ref 0.3–1.2)
Total Protein: 8.2 g/dL — ABNORMAL HIGH (ref 6.5–8.1)

## 2017-06-20 LAB — PSA: PROSTATIC SPECIFIC ANTIGEN: 0.98 ng/mL (ref 0.00–4.00)

## 2017-06-20 MED ORDER — DENOSUMAB 120 MG/1.7ML ~~LOC~~ SOLN
120.0000 mg | Freq: Once | SUBCUTANEOUS | Status: AC
  Administered 2017-06-20: 120 mg via SUBCUTANEOUS
  Filled 2017-06-20: qty 1.7

## 2017-06-20 MED ORDER — CYANOCOBALAMIN 1000 MCG/ML IJ SOLN
1000.0000 ug | Freq: Once | INTRAMUSCULAR | Status: AC
  Administered 2017-06-20: 1000 ug via INTRAMUSCULAR
  Filled 2017-06-20: qty 1

## 2017-06-20 NOTE — Progress Notes (Signed)
Dr. Walden Field made aware of calcium of 8.7 with normal albumin.  Okay to give Delton See today per MD.   Jeff [name] presents today for injection per the provider's orders.  Xgeva and B12 administrations without incident; see MAR for injection details.  Patient tolerated procedure well and without incident.  No questions or complaints noted at this time.  Discharged ambulatory.

## 2017-06-20 NOTE — Patient Instructions (Signed)
Jeff [name]  06/20/2017     @PREFPERIOPPHARMACY @   Your procedure is scheduled on  06/24/2017 .  Report to [facility] at  840  A.M.  Call this number if you have problems the morning of surgery:  [phone]   Remember:  Do not eat food or drink liquids after midnight.  Take these medicines the morning of surgery with A SIP OF WATER  Reglan, zofran or compazine, oxycodone.   Do not wear jewelry, make-up or nail polish.  Do not wear lotions, powders, or perfumes, or deodorant.  Do not shave 48 hours prior to surgery.  Men may shave face and neck.  Do not bring valuables to the hospital.  [facility] is not responsible for any belongings or valuables.  Contacts, dentures or bridgework may not be worn into surgery.  Leave your suitcase in the car.  After surgery it may be brought to your room.  For patients admitted to the hospital, discharge time will be determined by your treatment team.  Patients discharged the day of surgery will not be allowed to drive home.   Name and phone number of your driver:   family Special instructions:  None  Please read over the following fact sheets that you were given. Anesthesia Post-op Instructions and Care and Recovery After Surgery       Implanted [facility] Guide An implanted port is a type of central line that is placed under the skin. Central lines are used to provide IV access when treatment or nutrition needs to be given through a person's veins. Implanted ports are used for long-term IV access. An implanted port may be placed because:  You need IV medicine that would be irritating to the small veins in your hands or arms.  You need long-term IV medicines, such as antibiotics.  You need IV nutrition for a long period.  You need frequent blood draws for lab tests.  You need dialysis.  Implanted ports are usually placed in the chest area, but they can also be placed in the upper arm, the abdomen, or  the leg. An implanted port has two main parts:  Reservoir. The reservoir is round and will appear as a small, raised area under your skin. The reservoir is the part where a needle is inserted to give medicines or draw blood.  Catheter. The catheter is a thin, flexible tube that extends from the reservoir. The catheter is placed into a large vein. Medicine that is inserted into the reservoir goes into the catheter and then into the vein.  How will I care for my incision site? Do not get the incision site wet. Bathe or shower as directed by your health care provider. How is my port accessed? Special steps must be taken to access the port:  Before the port is accessed, a numbing cream can be placed on the skin. This helps numb the skin over the port site.  Your health care provider uses a sterile technique to access the port. ? Your health care provider must put on a mask and sterile gloves. ? The skin over your port is cleaned carefully with an antiseptic and allowed to dry. ? The port is gently pinched between sterile gloves, and a needle is inserted into the port.  Only "non-coring" port needles should be used to access the port. Once the port is accessed, a blood return should be checked. This helps  ensure that the port is in the vein and is not clogged.  If your port needs to remain accessed for a constant infusion, a clear (transparent) bandage will be placed over the needle site. The bandage and needle will need to be changed every week, or as directed by your health care provider.  Keep the bandage covering the needle clean and dry. Do not get it wet. Follow your health care provider's instructions on how to take a shower or bath while the port is accessed.  If your port does not need to stay accessed, no bandage is needed over the port.  What is flushing? Flushing helps keep the port from getting clogged. Follow your health care provider's instructions on how and when to flush the  port. Ports are usually flushed with saline solution or a medicine called heparin. The need for flushing will depend on how the port is used.  If the port is used for intermittent medicines or blood draws, the port will need to be flushed: ? After medicines have been given. ? After blood has been drawn. ? As part of routine maintenance.  If a constant infusion is running, the port may not need to be flushed.  How long will my port stay implanted? The port can stay in for as long as your health care provider thinks it is needed. When it is time for the port to come out, surgery will be done to remove it. The procedure is similar to the one performed when the port was put in. When should I seek immediate medical care? When you have an implanted port, you should seek immediate medical care if:  You notice a bad smell coming from the incision site.  You have swelling, redness, or drainage at the incision site.  You have more swelling or pain at the port site or the surrounding area.  You have a fever that is not controlled with medicine.  This information is not intended to replace advice given to you by your health care provider. Make sure you discuss any questions you have with your health care provider. Document Released: 04/12/2005 Document Revised: 09/18/2015 Document Reviewed: 12/18/2012 Elsevier Interactive Patient Education  2017 [place], Care After Refer to this sheet in the next few weeks. These instructions provide you with information about caring for yourself after your procedure. Your health care provider may also give you more specific instructions. Your treatment has been planned according to current medical practices, but problems sometimes occur. Call your health care provider if you have any problems or questions after your procedure. What can I expect after the procedure? After the procedure, it is common to have:  Soreness or pain near  your incision.  Some swelling or bruising near your incision.  Follow these instructions at home: Medicines  Take over-the-counter and prescription medicines only as told by your health care provider.  If you were prescribed an antibiotic medicine, take it as told by your health care provider. Do not stop taking the antibiotic even if you start to feel better. Bathing  Do not take baths, swim, or use a hot tub until your health care provider approves. Ask your health care provider if you can take showers. You may only be allowed to take sponge baths for bathing. Incision care  Follow instructions from your health care provider about how to take care of your incision. Make sure you: ? Wash your hands with soap and water before you change  your bandage (dressing). If soap and water are not available, use hand sanitizer. ? Change your dressing as told by your health care provider. ? Keep your dressing dry. ? Leave stitches (sutures), skin glue, or adhesive strips in place. These skin closures may need to stay in place for 2 weeks or longer. If adhesive strip edges start to loosen and curl up, you may trim the loose edges. Do not remove adhesive strips completely unless your health care provider tells you to do that.  Check your incision area every day for signs of infection. Check for: ? More redness, swelling, or pain. ? More fluid or blood. ? Warmth. ? Pus or a bad smell. Driving  If you received a sedative, do not drive for 24 hours after the procedure.  If you did not receive a sedative, ask your health care provider when it is safe to drive. Activity  Return to your normal activities as told by your health care provider. Ask your health care provider what activities are safe for you.  Until your health care provider says it is safe: ? Do not lift anything that is heavier than 10 lb (4.5 kg). ? Do not do activities that involve lifting your arms over your head. General  instructions  Do not use any tobacco products, such as cigarettes, chewing tobacco, and e-cigarettes. Tobacco can delay healing. If you need help quitting, ask your health care provider.  Keep all follow-up visits as told by your health care provider. This is important. Contact a health care provider if:  You have more redness, swelling, or pain around your incision.  You have more fluid or blood coming from your incision.  Your incision feels warm to the touch.  You have pus or a bad smell coming from your incision.  You have a fever.  You have pain that is not relieved by your pain medicine. Get help right away if:  You have chest pain.  You have difficulty breathing. This information is not intended to replace advice given to you by your health care provider. Make sure you discuss any questions you have with your health care provider. Document Released: 03/24/2015 Document Revised: 09/18/2015 Document Reviewed: 01/15/2015 Elsevier Interactive Patient Education  2018 [place] Insertion Implanted port insertion is a procedure to put in a port and catheter. The port is a device with an injectable disk that can be accessed by your health care provider. The port is connected to a vein in the chest or neck by a small flexible tube (catheter). There are different types of ports. The implanted port may be used as a long-term IV access for:  Medicines, such as chemotherapy.  Fluids.  Liquid nutrition, such as total parenteral nutrition (TPN).  Blood samples.  Having a port means that your health care provider will not need to use the veins in your arms for these procedures. Tell a health care provider about:  Any allergies you have.  All medicines you are taking, especially blood thinners, as well as any vitamins, herbs, eye drops, creams, over-the-counter medicines, and steroids.  Any problems you or family members have had with anesthetic  medicines.  Any blood disorders you have.  Any surgeries you have had.  Any medical conditions you have, including diabetes or kidney problems.  Whether you are pregnant or may be pregnant. What are the risks? Generally, this is a safe procedure. However, problems may occur, including:  Allergic reactions to medicines or dyes.  Damage to other structures or organs.  Infection.  Damage to the blood vessel, bruising, or bleeding at the puncture site.  Blood clot.  Breakdown of the skin over the port.  A collection of air in the chest that can cause one of the lungs to collapse (pneumothorax). This is rare.  What happens before the procedure? Staying hydrated Follow instructions from your health care provider about hydration, which may include:  Up to 2 hours before the procedure - you may continue to drink clear liquids, such as water, clear fruit juice, black coffee, and plain tea.  Eating and drinking restrictions  Follow instructions from your health care provider about eating and drinking, which may include: ? 8 hours before the procedure - stop eating heavy meals or foods such as meat, fried foods, or fatty foods. ? 6 hours before the procedure - stop eating light meals or foods, such as toast or cereal. ? 6 hours before the procedure - stop drinking milk or drinks that contain milk. ? 2 hours before the procedure - stop drinking clear liquids. Medicines  Ask your health care provider about: ? Changing or stopping your regular medicines. This is especially important if you are taking diabetes medicines or blood thinners. ? Taking medicines such as aspirin and ibuprofen. These medicines can thin your blood. Do not take these medicines before your procedure if your health care provider instructs you not to.  You may be given antibiotic medicine to help prevent infection. General instructions  Plan to have someone take you home from the hospital or clinic.  If you  will be going home right after the procedure, plan to have someone with you for 24 hours.  You may have blood tests.  You may be asked to shower with a germ-killing soap. What happens during the procedure?  To lower your risk of infection: ? Your health care team will wash or sanitize their hands. ? Your skin will be washed with soap. ? Hair may be removed from the surgical area.  An IV tube will be inserted into one of your veins.  You will be given one or more of the following: ? A medicine to help you relax (sedative). ? A medicine to numb the area (local anesthetic).  Two small cuts (incisions) will be made to insert the port. ? One incision will be made in your neck to get access to the vein where the catheter will lie. ? The other incision will be made in the upper chest. This is where the port will lie.  The procedure may be done using continuous X-ray (fluoroscopy) or other imaging tools for guidance.  The port and catheter will be placed. There may be a small, raised area where the port is.  The port will be flushed with a salt solution (saline), and blood will be drawn to make sure that it is working correctly.  The incisions will be closed.  Bandages (dressings) may be placed over the incisions. The procedure may vary among health care providers and hospitals. What happens after the procedure?  Your blood pressure, heart rate, breathing rate, and blood oxygen level will be monitored until the medicines you were given have worn off.  Do not drive for 24 hours if you were given a sedative.  You will be given a manufacturer's information card for the type of port that you have. Keep this with you.  Your port will need to be flushed and checked as told by your health  care provider, usually every few weeks.  A chest X-ray will be done to: ? Check the placement of the port. ? Make sure there is no injury to your lung. Summary  Implanted port insertion is a  procedure to put in a port and catheter.  The implanted port is used as a long-term IV access.  The port will need to be flushed and checked as told by your health care provider, usually every few weeks.  Keep your manufacturer's information card with you at all times. This information is not intended to replace advice given to you by your health care provider. Make sure you discuss any questions you have with your health care provider. Document Released: 01/31/2013 Document Revised: 03/03/2016 Document Reviewed: 03/03/2016 Elsevier Interactive Patient Education  2017 [place] Insertion, Care After This sheet gives you information about how to care for yourself after your procedure. Your health care provider may also give you more specific instructions. If you have problems or questions, contact your health care provider. What can I expect after the procedure? After your procedure, it is common to have:  Discomfort at the port insertion site.  Bruising on the skin over the port. This should improve over 3-4 days.  Follow these instructions at home: [facility] care  After your port is placed, you will get a manufacturer's information card. The card has information about your port. Keep this card with you at all times.  Take care of the port as told by your health care provider. Ask your health care provider if you or a family member can get training for taking care of the port at home. A home health care nurse may also take care of the port.  Make sure to remember what type of port you have. Incision care  Follow instructions from your health care provider about how to take care of your port insertion site. Make sure you: ? Wash your hands with soap and water before you change your bandage (dressing). If soap and water are not available, use hand sanitizer. ? Change your dressing as told by your health care provider. ? Leave stitches (sutures), skin glue, or adhesive  strips in place. These skin closures may need to stay in place for 2 weeks or longer. If adhesive strip edges start to loosen and curl up, you may trim the loose edges. Do not remove adhesive strips completely unless your health care provider tells you to do that.  Check your port insertion site every day for signs of infection. Check for: ? More redness, swelling, or pain. ? More fluid or blood. ? Warmth. ? Pus or a bad smell. General instructions  Do not take baths, swim, or use a hot tub until your health care provider approves.  Do not lift anything that is heavier than 10 lb (4.5 kg) for a week, or as told by your health care provider.  Ask your health care provider when it is okay to: ? Return to work or school. ? Resume usual physical activities or sports.  Do not drive for 24 hours if you were given a medicine to help you relax (sedative).  Take over-the-counter and prescription medicines only as told by your health care provider.  Wear a medical alert bracelet in case of an emergency. This will tell any health care providers that you have a port.  Keep all follow-up visits as told by your health care provider. This is important. Contact a health care provider if:  You cannot flush your port with saline as directed, or you cannot draw blood from the port.  You have a fever or chills.  You have more redness, swelling, or pain around your port insertion site.  You have more fluid or blood coming from your port insertion site.  Your port insertion site feels warm to the touch.  You have pus or a bad smell coming from the port insertion site. Get help right away if:  You have chest pain or shortness of breath.  You have bleeding from your port that you cannot control. Summary  Take care of the port as told by your health care provider.  Change your dressing as told by your health care provider.  Keep all follow-up visits as told by your health care provider. This  information is not intended to replace advice given to you by your health care provider. Make sure you discuss any questions you have with your health care provider. Document Released: 01/31/2013 Document Revised: 03/03/2016 Document Reviewed: 03/03/2016 Elsevier Interactive Patient Education  2017 [place] Anesthesia is a term that refers to techniques, procedures, and medicines that help a person stay safe and comfortable during a medical procedure. Monitored anesthesia care, or sedation, is one type of anesthesia. Your anesthesia specialist may recommend sedation if you will be having a procedure that does not require you to be unconscious, such as:  Cataract surgery.  A dental procedure.  A biopsy.  A colonoscopy.  During the procedure, you may receive a medicine to help you relax (sedative). There are three levels of sedation:  Mild sedation. At this level, you may feel awake and relaxed. You will be able to follow directions.  Moderate sedation. At this level, you will be sleepy. You may not remember the procedure.  Deep sedation. At this level, you will be asleep. You will not remember the procedure.  The more medicine you are given, the deeper your level of sedation will be. Depending on how you respond to the procedure, the anesthesia specialist may change your level of sedation or the type of anesthesia to fit your needs. An anesthesia specialist will monitor you closely during the procedure. Let your health care provider know about:  Any allergies you have.  All medicines you are taking, including vitamins, herbs, eye drops, creams, and over-the-counter medicines.  Any use of steroids (by mouth or as a cream).  Any problems you or family members have had with sedatives and anesthetic medicines.  Any blood disorders you have.  Any surgeries you have had.  Any medical conditions you have, such as sleep apnea.  Whether you are pregnant  or may be pregnant.  Any use of cigarettes, alcohol, or street drugs. What are the risks? Generally, this is a safe procedure. However, problems may occur, including:  Getting too much medicine (oversedation).  Nausea.  Allergic reaction to medicines.  Trouble breathing. If this happens, a breathing tube may be used to help with breathing. It will be removed when you are awake and breathing on your own.  Heart trouble.  Lung trouble.  Before the procedure Staying hydrated Follow instructions from your health care provider about hydration, which may include:  Up to 2 hours before the procedure - you may continue to drink clear liquids, such as water, clear fruit juice, black coffee, and plain tea.  Eating and drinking restrictions Follow instructions from your health care provider about eating and drinking, which may include:  8  hours before the procedure - stop eating heavy meals or foods such as meat, fried foods, or fatty foods.  6 hours before the procedure - stop eating light meals or foods, such as toast or cereal.  6 hours before the procedure - stop drinking milk or drinks that contain milk.  2 hours before the procedure - stop drinking clear liquids.  Medicines Ask your health care provider about:  Changing or stopping your regular medicines. This is especially important if you are taking diabetes medicines or blood thinners.  Taking medicines such as aspirin and ibuprofen. These medicines can thin your blood. Do not take these medicines before your procedure if your health care provider instructs you not to.  Tests and exams  You will have a physical exam.  You may have blood tests done to show: ? How well your kidneys and liver are working. ? How well your blood can clot.  General instructions  Plan to have someone take you home from the hospital or clinic.  If you will be going home right after the procedure, plan to have someone with you for 24  hours.  What happens during the procedure?  Your blood pressure, heart rate, breathing, level of pain and overall condition will be monitored.  An IV tube will be inserted into one of your veins.  Your anesthesia specialist will give you medicines as needed to keep you comfortable during the procedure. This may mean changing the level of sedation.  The procedure will be performed. After the procedure  Your blood pressure, heart rate, breathing rate, and blood oxygen level will be monitored until the medicines you were given have worn off.  Do not drive for 24 hours if you received a sedative.  You may: ? Feel sleepy, clumsy, or nauseous. ? Feel forgetful about what happened after the procedure. ? Have a sore throat if you had a breathing tube during the procedure. ? Vomit. This information is not intended to replace advice given to you by your health care provider. Make sure you discuss any questions you have with your health care provider. Document Released: 01/06/2005 Document Revised: 09/19/2015 Document Reviewed: 08/03/2015 Elsevier Interactive Patient Education  2018 [place], Care After These instructions provide you with information about caring for yourself after your procedure. Your health care provider may also give you more specific instructions. Your treatment has been planned according to current medical practices, but problems sometimes occur. Call your health care provider if you have any problems or questions after your procedure. What can I expect after the procedure? After your procedure, it is common to:  Feel sleepy for several hours.  Feel clumsy and have poor balance for several hours.  Feel forgetful about what happened after the procedure.  Have poor judgment for several hours.  Feel nauseous or vomit.  Have a sore throat if you had a breathing tube during the procedure.  Follow these instructions at home: For at least  24 hours after the procedure:   Do not: ? Participate in activities in which you could fall or become injured. ? Drive. ? Use heavy machinery. ? Drink alcohol. ? Take sleeping pills or medicines that cause drowsiness. ? Make important decisions or sign legal documents. ? Take care of children on your own.  Rest. Eating and drinking  Follow the diet that is recommended by your health care provider.  If you vomit, drink water, juice, or soup when you can drink without vomiting.  Make sure you have little or no nausea before eating solid foods. General instructions  Have a responsible adult stay with you until you are awake and alert.  Take over-the-counter and prescription medicines only as told by your health care provider.  If you smoke, do not smoke without supervision.  Keep all follow-up visits as told by your health care provider. This is important. Contact a health care provider if:  You keep feeling nauseous or you keep vomiting.  You feel light-headed.  You develop a rash.  You have a fever. Get help right away if:  You have trouble breathing. This information is not intended to replace advice given to you by your health care provider. Make sure you discuss any questions you have with your health care provider. Document Released: 08/03/2015 Document Revised: 12/03/2015 Document Reviewed: 08/03/2015 Elsevier Interactive Patient Education  [employer].

## 2017-06-22 ENCOUNTER — Ambulatory Visit (HOSPITAL_COMMUNITY): Payer: Medicare Other | Admitting: Internal Medicine

## 2017-06-22 ENCOUNTER — Ambulatory Visit (HOSPITAL_COMMUNITY): Payer: Medicare Other

## 2017-06-22 ENCOUNTER — Other Ambulatory Visit (HOSPITAL_COMMUNITY): Payer: Medicare Other

## 2017-06-22 ENCOUNTER — Encounter (HOSPITAL_COMMUNITY)
Admission: RE | Admit: 2017-06-22 | Discharge: 2017-06-22 | Disposition: A | Discharge status: Home / Self Care | Payer: Medicare Other | Source: Ambulatory Visit | Attending: General Surgery | Admitting: General Surgery

## 2017-06-22 DIAGNOSIS — Z01812 Encounter for preprocedural laboratory examination: Secondary | ICD-10-CM | POA: Diagnosis not present

## 2017-06-22 LAB — RAPID URINE DRUG SCREEN, HOSP PERFORMED
Amphetamines: NOT DETECTED
Barbiturates: NOT DETECTED
Benzodiazepines: NOT DETECTED
Cocaine: POSITIVE — AB
OPIATES: NOT DETECTED
TETRAHYDROCANNABINOL: NOT DETECTED

## 2017-06-24 ENCOUNTER — Ambulatory Visit (HOSPITAL_COMMUNITY): Admission: RE | Admit: 2017-06-24 | Payer: Medicare Other | Source: Ambulatory Visit | Admitting: General Surgery

## 2017-06-24 ENCOUNTER — Encounter (HOSPITAL_COMMUNITY): Admission: RE | Payer: Self-pay | Source: Ambulatory Visit

## 2017-06-24 SURGERY — REMOVAL PORT-A-CATH
Anesthesia: General | Laterality: Right

## 2017-06-27 ENCOUNTER — Encounter (HOSPITAL_COMMUNITY): Payer: Self-pay | Admitting: Internal Medicine

## 2017-06-27 ENCOUNTER — Other Ambulatory Visit: Payer: Self-pay

## 2017-06-27 ENCOUNTER — Inpatient Hospital Stay (HOSPITAL_COMMUNITY): Payer: Medicare Other

## 2017-06-27 ENCOUNTER — Inpatient Hospital Stay (HOSPITAL_COMMUNITY): Payer: Medicare Other | Attending: Oncology | Admitting: Internal Medicine

## 2017-06-27 ENCOUNTER — Ambulatory Visit (HOSPITAL_COMMUNITY): Payer: Self-pay

## 2017-06-27 ENCOUNTER — Other Ambulatory Visit (HOSPITAL_COMMUNITY): Payer: Medicare Other

## 2017-06-27 VITALS — BP 125/73 | HR 65 | Temp 97.8°F | Resp 18 | Wt 166.2 lb

## 2017-06-27 VITALS — BP 104/63 | HR 87 | Temp 97.7°F | Resp 20 | Ht 72.0 in | Wt 166.2 lb

## 2017-06-27 DIAGNOSIS — Z5189 Encounter for other specified aftercare: Secondary | ICD-10-CM | POA: Diagnosis not present

## 2017-06-27 DIAGNOSIS — C61 Malignant neoplasm of prostate: Secondary | ICD-10-CM

## 2017-06-27 DIAGNOSIS — Z5111 Encounter for antineoplastic chemotherapy: Secondary | ICD-10-CM | POA: Diagnosis present

## 2017-06-27 DIAGNOSIS — C7951 Secondary malignant neoplasm of bone: Secondary | ICD-10-CM | POA: Insufficient documentation

## 2017-06-27 LAB — CBC WITH DIFFERENTIAL/PLATELET
BASOS ABS: 0 10*3/uL (ref 0.0–0.1)
BASOS PCT: 0 %
Eosinophils Absolute: 0.1 10*3/uL (ref 0.0–0.7)
Eosinophils Relative: 2 %
HCT: 34.3 % — ABNORMAL LOW (ref 39.0–52.0)
Hemoglobin: 11.1 g/dL — ABNORMAL LOW (ref 13.0–17.0)
Lymphocytes Relative: 24 %
Lymphs Abs: 1.2 10*3/uL (ref 0.7–4.0)
MCH: 30 pg (ref 26.0–34.0)
MCHC: 32.4 g/dL (ref 30.0–36.0)
MCV: 92.7 fL (ref 78.0–100.0)
MONO ABS: 0.5 10*3/uL (ref 0.1–1.0)
Monocytes Relative: 9 %
NEUTROS ABS: 3.2 10*3/uL (ref 1.7–7.7)
Neutrophils Relative %: 65 %
PLATELETS: 281 10*3/uL (ref 150–400)
RBC: 3.7 MIL/uL — AB (ref 4.22–5.81)
RDW: 14.5 % (ref 11.5–15.5)
WBC: 5.1 10*3/uL (ref 4.0–10.5)

## 2017-06-27 LAB — COMPREHENSIVE METABOLIC PANEL
ALBUMIN: 3.9 g/dL (ref 3.5–5.0)
ALT: 16 U/L — ABNORMAL LOW (ref 17–63)
AST: 25 U/L (ref 15–41)
Alkaline Phosphatase: 57 U/L (ref 38–126)
Anion gap: 10 (ref 5–15)
BUN: 22 mg/dL — AB (ref 6–20)
CHLORIDE: 105 mmol/L (ref 101–111)
CO2: 22 mmol/L (ref 22–32)
Calcium: 9.7 mg/dL (ref 8.9–10.3)
Creatinine, Ser: 1.93 mg/dL — ABNORMAL HIGH (ref 0.61–1.24)
GFR calc Af Amer: 39 mL/min — ABNORMAL LOW (ref >60)
GFR calc non Af Amer: 34 mL/min — ABNORMAL LOW (ref >60)
GLUCOSE: 102 mg/dL — AB (ref 65–99)
POTASSIUM: 4.6 mmol/L (ref 3.5–5.1)
Sodium: 137 mmol/L (ref 135–145)
Total Bilirubin: 0.2 mg/dL — ABNORMAL LOW (ref 0.3–1.2)
Total Protein: 8.1 g/dL (ref 6.5–8.1)

## 2017-06-27 LAB — LACTATE DEHYDROGENASE: LDH: 166 U/L (ref 98–192)

## 2017-06-27 LAB — PSA: Prostatic Specific Antigen: 0.92 ng/mL (ref 0.00–4.00)

## 2017-06-27 MED ORDER — OCTREOTIDE ACETATE 30 MG IM KIT
PACK | INTRAMUSCULAR | Status: AC
  Filled 2017-06-27: qty 1

## 2017-06-27 MED ORDER — DIPHENHYDRAMINE HCL 50 MG/ML IJ SOLN
25.0000 mg | Freq: Once | INTRAMUSCULAR | Status: AC
  Administered 2017-06-27: 25 mg via INTRAVENOUS
  Filled 2017-06-27: qty 1

## 2017-06-27 MED ORDER — DEXAMETHASONE SODIUM PHOSPHATE 10 MG/ML IJ SOLN
10.0000 mg | Freq: Once | INTRAMUSCULAR | Status: AC
  Administered 2017-06-27: 10 mg via INTRAVENOUS
  Filled 2017-06-27: qty 1

## 2017-06-27 MED ORDER — FAMOTIDINE IN NACL 20-0.9 MG/50ML-% IV SOLN
20.0000 mg | Freq: Once | INTRAVENOUS | Status: AC
  Administered 2017-06-27: 20 mg via INTRAVENOUS
  Filled 2017-06-27: qty 50

## 2017-06-27 MED ORDER — SODIUM CHLORIDE 0.9 % IV SOLN
Freq: Once | INTRAVENOUS | Status: AC
  Administered 2017-06-27: 10:00:00 via INTRAVENOUS

## 2017-06-27 MED ORDER — PEGFILGRASTIM 6 MG/0.6ML ~~LOC~~ PSKT
6.0000 mg | PREFILLED_SYRINGE | Freq: Once | SUBCUTANEOUS | Status: AC
  Administered 2017-06-27: 6 mg via SUBCUTANEOUS
  Filled 2017-06-27: qty 0.6

## 2017-06-27 MED ORDER — DEXTROSE 5 % IV SOLN
20.0000 mg/m2 | Freq: Once | INTRAVENOUS | Status: AC
  Administered 2017-06-27: 41 mg via INTRAVENOUS
  Filled 2017-06-27: qty 4.1

## 2017-06-27 MED ORDER — HEPARIN SOD (PORK) LOCK FLUSH 100 UNIT/ML IV SOLN
500.0000 [IU] | Freq: Once | INTRAVENOUS | Status: AC | PRN
  Administered 2017-06-27: 500 [IU]
  Filled 2017-06-27: qty 5

## 2017-06-27 MED ORDER — SODIUM CHLORIDE 0.9% FLUSH
10.0000 mL | INTRAVENOUS | Status: DC | PRN
  Administered 2017-06-27: 10 mL
  Filled 2017-06-27: qty 10

## 2017-06-27 NOTE — Patient Instructions (Signed)
[place] at [facility] Discharge Instructions   You were seen today by Dr. Zoila Shutter We will set you up in April for a bone scan  Your lab work today looked good  You will have treatment today Continue treatment every 21 days We will have you see the physician again on your next treatment day.    Thank you for choosing [place] at [facility] to provide your oncology and hematology care.  To afford each patient quality time with our provider, please arrive at least 15 minutes before your scheduled appointment time.    If you have a lab appointment with the [facility] please come in thru the  Main Entrance and check in at the main information desk  You need to re-schedule your appointment should you arrive 10 or more minutes late.  We strive to give you quality time with our providers, and arriving late affects you and other patients whose appointments are after yours.  Also, if you no show three or more times for appointments you may be dismissed from the clinic at the providers discretion.     Again, thank you for choosing [facility].  Our hope is that these requests will decrease the amount of time that you wait before being seen by our physicians.       _____________________________________________________________  Should you have questions after your visit to [facility], please contact our office at [phone] between the hours of 8:30 a.m. and 4:30 p.m.  Voicemails left after 4:30 p.m. will not be returned until the following business day.  For prescription refill requests, have your pharmacy contact our office.       Resources For Cancer Patients and their Caregivers ? American Cancer Society: Can assist with transportation, wigs, general needs, runs Look Good Feel Better.        (807)819-4615 ? Cancer Care: Provides financial assistance, online support groups, medication/co-pay assistance.   1-800-813-HOPE (409)180-1239) ? [place] Assists [place] cancer patients and their families through emotional , educational and financial support.  929-330-9149 ? [place] DSS Where to apply for food stamps, Medicaid and utility assistance. 873-885-9634 ? RCATS: Transportation to medical appointments. 208-352-2783 ? Social Security Administration: May apply for disability if have a Stage IV cancer. 503-002-9096 4055564927 ? [employer], Disability and Transit Services: Assists with nutrition, care and transit needs. [place] Support Programs:   > Cancer Support Group  2nd Tuesday of the month 1pm-2pm, Journey Room   > Creative Journey  3rd Tuesday of the month 1130am-1pm, Journey Room

## 2017-06-27 NOTE — Progress Notes (Signed)
Patient seen today by Dr. Walden Field, labs reviewed and is ok to treat today per MD.   Treatment given per orders. Patient tolerated it well without problems. Vitals stable and discharged home from clinic ambulatory. Follow up as scheduled.

## 2017-06-27 NOTE — Patient Instructions (Signed)
[facility] Discharge Instructions for Patients Receiving Chemotherapy   Beginning January 23rd 2017 lab work for the Cancer Center will be done in the  Main lab at [facility] on 1st floor. If you have a lab appointment with the Cancer Center please come in thru the  Main Entrance and check in at the main information desk   Today you received the following chemotherapy agents   To help prevent nausea and vomiting after your treatment, we encourage you to take your nausea medication     If you develop nausea and vomiting, or diarrhea that is not controlled by your medication, call the clinic.  The clinic phone number is [phone]. Office hours are Monday-Friday 8:30am-5:00pm.  BELOW ARE SYMPTOMS THAT SHOULD BE REPORTED IMMEDIATELY:  *FEVER GREATER THAN 101.0 F  *CHILLS WITH OR WITHOUT FEVER  NAUSEA AND VOMITING THAT IS NOT CONTROLLED WITH YOUR NAUSEA MEDICATION  *UNUSUAL SHORTNESS OF BREATH  *UNUSUAL BRUISING OR BLEEDING  TENDERNESS IN MOUTH AND THROAT WITH OR WITHOUT PRESENCE OF ULCERS  *URINARY PROBLEMS  *BOWEL PROBLEMS  UNUSUAL RASH Items with * indicate a potential emergency and should be followed up as soon as possible. If you have an emergency after office hours please contact your primary care physician or go to the nearest emergency department.  Please call the clinic during office hours if you have any questions or concerns.   You may also contact the Patient Navigator at [phone] should you have any questions or need assistance in obtaining follow up care.      Resources For Cancer Patients and their Caregivers ? American Cancer Society: Can assist with transportation, wigs, general needs, runs Look Good Feel Better.        1-888-227-6333 ? Cancer Care: Provides financial assistance, online support groups, medication/co-pay assistance.  1-800-813-HOPE (4673) ? [facility] Assists [place] cancer  patients and their families through emotional , educational and financial support.  336-427-4357 ? [place] DSS Where to apply for food stamps, Medicaid and utility assistance. 336-342-1394 ? RCATS: Transportation to medical appointments. 336-347-2287 ? Social Security Administration: May apply for disability if have a Stage IV cancer. 336-342-7796 1-800-772-1213 ? [place] Aging, Disability and Transit Services: Assists with nutrition, care and transit needs. 336-349-2343         

## 2017-07-16 ENCOUNTER — Other Ambulatory Visit (HOSPITAL_COMMUNITY): Payer: Self-pay | Admitting: Adult Health

## 2017-07-17 NOTE — Progress Notes (Signed)
Diagnosis Prostate cancer metastatic to multiple sites [facility]) - Plan: NM Bone Scan Whole Body, CBC with Differential/Platelet, Comprehensive metabolic panel, Lactate dehydrogenase  Staging Cancer Staging No matching staging information was found for the patient.  Assessment and Plan:  1.  Prostate cancer metastatic to multiple sites.  72 y.o. 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 Lebron Conners.  Currently undergoing palliative systemic chemotherapy with cabazitaxel and supportive therapy with denosumab.  Most recent PSA 0.92.  He will proceed with therapy today.  He is set up for Bone scan in 07/2017 and will follow-up to go over results.  He will continue Cabazitaxel as recommended.    2.  Bone mets.  Last bone scan 08/2016.  He will be set up for repeat imaging in 4 or 08/2017 and will follow-up to go over results.  Pt on Xgeva every 6 weeks per Dr. Lebron Conners.    3.  PAC concerns.    Pt has been seen by surgery.  He tested + for Cocaine so removal as cancelled.  Pt is afebrile with normal WBC 5.1.  Will continue to monitor site.    4.  + drug screen.  Pt tested + for cocaine.  Will discuss with social Engineer, civil (consulting).    Current Status:  Pt is here today for follow-up prior to C16 Cabazitaxel.      Prostate cancer metastatic to multiple sites [facility])   01/26/2014 Tumor Marker    PSA > 5000      01/28/2014 Initial Biopsy    Metastatic adenocarcinoma of prostate      02/05/2014 Surgery    Bilateral orchiectomy by Dr. Junious Silk      02/26/2014 Tumor Marker    PSA = 399.5      03/14/2014 Tumor Marker    PSA= 89.51      03/14/2014 - 06/26/2014 Chemotherapy    Docetaxel 75 mg/kg every 21 days x 6 cycles      04/24/2014 Tumor Marker    PSA- 19.89      07/23/2014 Procedure    Nephrostomy tube removed by IR, Dr. Geroge Baseman      07/24/2014 Tumor Marker    PSA= 6.15      10/16/2014 Tumor Marker    PSA: 3.54       01/08/2015  Tumor Marker    PSA: 2.13       01/17/2015 Imaging    CT CAP-  Massive pelvic and retroperitoneal lymphadenopathy noted on the prior study has nearly completely resolved, now with only a small amount of residual amorphous soft tissue predominantly around the the infrarenal abdominal aorta.      01/17/2015 Imaging    Bone scan- Widespread osseous metastatic disease with multiple foci of increased activity throughout the skeleton, corresponding with the findings on the prior CT from October, 2015.      04/11/2015 Tumor Marker    PSA: 1.87       07/21/2015 Imaging    Bone scan- Bony metastatic disease again noted at multiple sites, stable from prior study. No progression of bony metastatic disease is demonstrable on this study.      07/25/2015 Imaging    CT CAP- Stable matted soft tissue density in the retroperitoneum and extraperitoneal pelvis consistent with treated disease. No recurrent lymphadenopathy.  No pulmonary metastatic disease. Diffuse stable sclerotic metastatic bone disease.      01/14/2016 Imaging    Bone scan-  Multiple sites of abnormal increased tracer localization involving BILATERAL  ribs, T11, T12, questionably RIGHT scapula and LEFT iliac bone suspicious for osseous metastases.      01/23/2016 Imaging    CT CAP- 1. Similar widespread osseous metastasis. 2. Similar soft tissue thickening within the retroperitoneum of the abdomen. Improved left pelvic side wall soft tissue thickening. These are consistent with sites of treated disease. No well-defined adenopathy. 3. No new sites of disease.      05/03/2016 -  Chemotherapy    Jevtana every 21 days       09/06/2016 Imaging    Restaging CT C/A/P: IMPRESSION: 1. Similar appearance of widespread sclerotic bone metastases. 2. No change in soft tissue thickening within the retroperitoneum and left pelvic sidewall. No well defined adenopathy identified. 3. No new sites of disease 4. Aortic Atherosclerosis  (ICD10-I70.0). Coronary artery calcifications noted. 5. 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 6. Stable 9 mm pancreatic cystic lesion. Favor pseudocyst. Indolent neoplasm may look similar. Attention on follow-up imaging.      09/06/2016 Imaging    Bone Scan: IMPRESSION: In this patient with known diffuse sclerotic metastatic disease, bone scan does not reveal progression of radiotracer uptake. Several of the areas of previously demonstrated radiotracer uptake appear less prominent.  Change in appearance of radiotracer uptake involving the mandible now more notable on the right and previously more notable on the left may reflect result of dental disease rather than metastatic disease.       04/08/2017 Tumor Marker    PSA 0.89        Problem List Patient Active Problem List   Diagnosis Date Noted  . B12 deficiency [E53.8] 04/01/2016  . Malnutrition of moderate degree ([place]) [E44.0] 05/24/2014  . Bacteremia [R78.81] 05/24/2014  . Pyrexia [R50.9]   . Sepsis ([place]) [A41.9] 05/23/2014  . UTI (lower urinary tract infection) [N39.0] 05/23/2014  . Health care maintenance [Z00.00] 02/06/2014  . Homelessness [Z59.0] 01/31/2014  . Prostate cancer metastatic to multiple sites ([place]) [C61] 01/30/2014  . Bone lesion [M89.9] 01/26/2014  . Acute renal failure ([place]) [N17.9] 01/26/2014  . Bilateral hydronephrosis [N13.30] 01/26/2014  . Anemia of chronic disease [D63.8] 01/26/2014  . History of cocaine abuse [Z87.898] 01/26/2014    Past Medical History Past Medical History:  Diagnosis Date  . Acute renal failure ([place]) 01/26/2014  . Alcohol abuse   . Anemia of chronic disease 01/26/2014  . Bilateral hydronephrosis 01/26/2014  . History of cocaine abuse 01/26/2014  . Homelessness 01/31/2014  . Prostate cancer metastatic to multiple sites ([place]) 01/30/2014  . Sepsis ([place])   . UTI (lower urinary tract infection)     Past  Surgical History Past Surgical History:  Procedure Laterality Date  . ORCHIECTOMY Bilateral 02/05/2014   Procedure: BILATERAL ORCHIECTOMY;  Surgeon: Festus Aloe, MD;  Location: [facility];  Service: Urology;  Laterality: Bilateral;  . PERCUTANEOUS NEPHROSTOMY Bilateral    IR Dr. Junious Silk changed on 04/22/2014  . PORTACATH PLACEMENT Right 03/12/14    Family History Family History  Problem Relation Age of Onset  . Cancer Brother 55     Social History  reports that he has been smoking cigarettes.  He has a 13.75 pack-year smoking history. He has never used smokeless tobacco. He reports that he drinks about 0.6 oz of alcohol per week. He reports that he has current or past drug history. Drug: Cocaine.  Medications  Current Outpatient Medications:  .  Cabazitaxel (JEVTANA IV), Inject into the vein. Every 3 weeks, Disp: , Rfl:  .  lidocaine-prilocaine (EMLA) cream, Apply a quarter size amount to port site 1 hour prior to chemo. Do not rub in. Cover with plastic wrap., Disp: 30 g, Rfl: 3 .  metoCLOPramide (REGLAN) 5 MG tablet, The day after chemo take 1 tab four times a day x 2 days. Then may take 1 tab four times a day if needed for nausea/vomiting., Disp: 60 tablet, Rfl: 2 .  ondansetron (ZOFRAN) 8 MG tablet, Take 1 tablet (8 mg total) by mouth every 8 (eight) hours as needed for nausea or vomiting., Disp: 30 tablet, Rfl: 2 .  oxyCODONE (ROXICODONE) 15 MG immediate release tablet, Take 1 tablet (15 mg total) by mouth every 4 (four) hours as needed for pain. (Patient taking differently: Take 15-30 mg by mouth 2 (two) times daily as needed for pain (1-2 tablets depending on pain level). ), Disp: 60 tablet, Rfl: 0 .  prochlorperazine (COMPAZINE) 10 MG tablet, The day after chemo take 1 tab four times a day x 2 days. Then may take 1 tab four times a day if needed for nausea/vomiting., Disp: 60 tablet, Rfl: 2 No current facility-administered medications for this visit.   Facility-Administered  Medications Ordered in Other Visits:  .  cyanocobalamin ((VITAMIN B-12)) injection 1,000 mcg, 1,000 mcg, Intramuscular, Once, Kefalas, Thomas S, PA-C .  denosumab (XGEVA) injection 120 mg, 120 mg, Subcutaneous, Once, Kefalas, Thomas S, PA-C  Allergies Patient has no known allergies.  Review of Systems Review of Systems - Oncology ROS as per HPI otherwise 12 point ROS is negative.   Physical Exam  Vitals Wt Readings from Last 3 Encounters:  06/27/17 166 lb 3.2 oz (75.4 kg)  06/27/17 166 lb 3.2 oz (75.4 kg)  06/09/17 172 lb (78 kg)   Temp Readings from Last 3 Encounters:  06/27/17 97.7 F (36.5 C) (Oral)  06/27/17 97.8 F (36.6 C) (Oral)  06/22/17 98 F (36.7 C) (Oral)   BP Readings from Last 3 Encounters:  06/27/17 104/63  06/27/17 125/73  06/22/17 116/75   Pulse Readings from Last 3 Encounters:  06/27/17 87  06/27/17 65  06/22/17 73   Constitutional: Well-developed, well-nourished, and in no distress.   HENT: Head: Normocephalic and atraumatic.  Mouth/Throat: No oropharyngeal exudate. Mucosa moist. Eyes: Pupils are equal, round, and reactive to light. Conjunctivae are normal. No scleral icterus.  Neck: Normal range of motion. Neck supple. No JVD present.  Cardiovascular: Normal rate, regular rhythm and normal heart sounds.  Exam reveals no gallop and no friction rub.   No murmur heard. Pulmonary/Chest: Effort normal and breath sounds normal. No respiratory distress. No wheezes.No rales.  Abdominal: Soft. Bowel sounds are normal. No distension. There is no tenderness. There is no guarding.  Musculoskeletal: No edema or tenderness.  Lymphadenopathy: No cervical, axillary or supraclavicular adenopathy.  Neurological: Alert and oriented to person, place, and time. No cranial nerve deficit.  Skin: Skin is warm and dry. No rash noted. No erythema. No pallor.  Psychiatric: Affect and judgment normal.   Labs Lab on 06/27/2017  Component Date Value Ref Range Status  .  WBC 06/27/2017 5.1  4.0 - 10.5 K/uL Final  . RBC 06/27/2017 3.70* 4.22 - 5.81 MIL/uL Final  . Hemoglobin 06/27/2017 11.1* 13.0 - 17.0 g/dL Final  . HCT 06/27/2017 34.3* 39.0 - 52.0 % Final  . MCV 06/27/2017 92.7  78.0 - 100.0 fL Final  . MCH 06/27/2017 30.0  26.0 - 34.0 pg Final  . MCHC 06/27/2017 32.4  30.0 - 36.0 g/dL Final  .  RDW 06/27/2017 14.5  11.5 - 15.5 % Final  . Platelets 06/27/2017 281  150 - 400 K/uL Final  . Neutrophils Relative % 06/27/2017 65  % Final  . Neutro Abs 06/27/2017 3.2  1.7 - 7.7 K/uL Final  . Lymphocytes Relative 06/27/2017 24  % Final  . Lymphs Abs 06/27/2017 1.2  0.7 - 4.0 K/uL Final  . Monocytes Relative 06/27/2017 9  % Final  . Monocytes Absolute 06/27/2017 0.5  0.1 - 1.0 K/uL Final  . Eosinophils Relative 06/27/2017 2  % Final  . Eosinophils Absolute 06/27/2017 0.1  0.0 - 0.7 K/uL Final  . Basophils Relative 06/27/2017 0  % Final  . Basophils Absolute 06/27/2017 0.0  0.0 - 0.1 K/uL Final   Performed at [facility], [address], [place], [place] [address]  . Sodium 06/27/2017 137  135 - 145 mmol/L Final  . Potassium 06/27/2017 4.6  3.5 - 5.1 mmol/L Final  . Chloride 06/27/2017 105  101 - 111 mmol/L Final  . CO2 06/27/2017 22  22 - 32 mmol/L Final  . Glucose, Bld 06/27/2017 102* 65 - 99 mg/dL Final  . BUN 06/27/2017 22* 6 - 20 mg/dL Final  . Creatinine, Ser 06/27/2017 1.93* 0.61 - 1.24 mg/dL Final  . Calcium 06/27/2017 9.7  8.9 - 10.3 mg/dL Final  . Total Protein 06/27/2017 8.1  6.5 - 8.1 g/dL Final  . Albumin 06/27/2017 3.9  3.5 - 5.0 g/dL Final  . AST 06/27/2017 25  15 - 41 U/L Final  . ALT 06/27/2017 16* 17 - 63 U/L Final  . Alkaline Phosphatase 06/27/2017 57  38 - 126 U/L Final  . Total Bilirubin 06/27/2017 0.2* 0.3 - 1.2 mg/dL Final  . GFR calc non Af Amer 06/27/2017 34* >60 mL/min Final  . GFR calc Af Amer 06/27/2017 39* >60 mL/min Final   Comment: (NOTE) The eGFR has been calculated using the CKD EPI equation. This calculation has not been  validated in all clinical situations. eGFR's persistently <60 mL/min signify possible Chronic Kidney Disease.   Georgiann Hahn gap 06/27/2017 10  5 - 15 Final   Performed at [facility], [address], [place], [place] [address]  . Prostatic Specific Antigen 06/27/2017 0.92  0.00 - 4.00 ng/mL Final   Comment: (NOTE) While PSA levels of <=4.0 ng/ml are reported as reference range, some men with levels below 4.0 ng/ml can have prostate cancer and many men with PSA above 4.0 ng/ml do not have prostate cancer.  Other tests such as free PSA, age specific reference ranges, PSA velocity and PSA doubling time may be helpful especially in men less than 41 years old. Performed at [facility], [place] [address], [place], [place] [address]   . LDH 06/27/2017 166  98 - 192 U/L Final   Performed at [facility], [address], [place], [place] [address]     Pathology Orders Placed This Encounter  Procedures  . NM Bone Scan Whole Body    Standing Status:   Future    Standing Expiration Date:   06/27/2018    Order Specific Question:   If indicated for the ordered procedure, I authorize the administration of a radiopharmaceutical per Radiology protocol    Answer:   Yes    Order Specific Question:   Preferred imaging location?    Answer:   [facility]    Order Specific Question:   Radiology Contrast Protocol - do NOT remove file path    Answer:   \\  [email or web address]  . CBC with Differential/Platelet    Standing Status:   Future    Standing Expiration Date:   06/28/2018  . Comprehensive metabolic panel    Standing Status:   Future    Standing Expiration Date:   06/28/2018  . Lactate dehydrogenase    Standing Status:   Future    Standing Expiration Date:   06/28/2018       Zoila Shutter MD

## 2017-07-18 ENCOUNTER — Other Ambulatory Visit (HOSPITAL_COMMUNITY): Payer: Medicare Other

## 2017-07-18 ENCOUNTER — Ambulatory Visit (HOSPITAL_COMMUNITY): Payer: Medicare Other | Admitting: Hematology

## 2017-07-18 ENCOUNTER — Ambulatory Visit (HOSPITAL_COMMUNITY): Payer: Medicare Other

## 2017-07-21 ENCOUNTER — Inpatient Hospital Stay (HOSPITAL_BASED_OUTPATIENT_CLINIC_OR_DEPARTMENT_OTHER): Payer: Medicare Other | Admitting: Internal Medicine

## 2017-07-21 ENCOUNTER — Inpatient Hospital Stay (HOSPITAL_COMMUNITY): Payer: Medicare Other

## 2017-07-21 ENCOUNTER — Encounter (HOSPITAL_COMMUNITY): Payer: Self-pay | Admitting: Internal Medicine

## 2017-07-21 DIAGNOSIS — C61 Malignant neoplasm of prostate: Secondary | ICD-10-CM | POA: Diagnosis not present

## 2017-07-21 DIAGNOSIS — C7951 Secondary malignant neoplasm of bone: Secondary | ICD-10-CM

## 2017-07-21 DIAGNOSIS — Z5111 Encounter for antineoplastic chemotherapy: Secondary | ICD-10-CM | POA: Diagnosis not present

## 2017-07-21 LAB — CBC WITH DIFFERENTIAL/PLATELET
Basophils Absolute: 0 10*3/uL (ref 0.0–0.1)
Basophils Relative: 0 %
EOS PCT: 0 %
Eosinophils Absolute: 0 10*3/uL (ref 0.0–0.7)
HCT: 33.4 % — ABNORMAL LOW (ref 39.0–52.0)
Hemoglobin: 10.7 g/dL — ABNORMAL LOW (ref 13.0–17.0)
LYMPHS ABS: 1.3 10*3/uL (ref 0.7–4.0)
Lymphocytes Relative: 15 %
MCH: 29 pg (ref 26.0–34.0)
MCHC: 32 g/dL (ref 30.0–36.0)
MCV: 90.5 fL (ref 78.0–100.0)
MONO ABS: 1.7 10*3/uL — AB (ref 0.1–1.0)
MONOS PCT: 19 %
Neutro Abs: 5.8 10*3/uL (ref 1.7–7.7)
Neutrophils Relative %: 66 %
PLATELETS: 558 10*3/uL — AB (ref 150–400)
RBC: 3.69 MIL/uL — ABNORMAL LOW (ref 4.22–5.81)
RDW: 15.4 % (ref 11.5–15.5)
WBC: 8.7 10*3/uL (ref 4.0–10.5)

## 2017-07-21 LAB — COMPREHENSIVE METABOLIC PANEL
ALK PHOS: 60 U/L (ref 38–126)
ALT: 17 U/L (ref 17–63)
ANION GAP: 13 (ref 5–15)
AST: 24 U/L (ref 15–41)
Albumin: 3.4 g/dL — ABNORMAL LOW (ref 3.5–5.0)
BUN: 23 mg/dL — ABNORMAL HIGH (ref 6–20)
CO2: 21 mmol/L — AB (ref 22–32)
Calcium: 8.9 mg/dL (ref 8.9–10.3)
Chloride: 99 mmol/L — ABNORMAL LOW (ref 101–111)
Creatinine, Ser: 1.83 mg/dL — ABNORMAL HIGH (ref 0.61–1.24)
GFR calc non Af Amer: 36 mL/min — ABNORMAL LOW (ref >60)
GFR, EST AFRICAN AMERICAN: 41 mL/min — AB (ref >60)
Glucose, Bld: 96 mg/dL (ref 65–99)
POTASSIUM: 5 mmol/L (ref 3.5–5.1)
SODIUM: 133 mmol/L — AB (ref 135–145)
Total Bilirubin: 0.8 mg/dL (ref 0.3–1.2)
Total Protein: 9 g/dL — ABNORMAL HIGH (ref 6.5–8.1)

## 2017-07-21 LAB — PSA: Prostatic Specific Antigen: 0.96 ng/mL (ref 0.00–4.00)

## 2017-07-25 ENCOUNTER — Encounter (HOSPITAL_COMMUNITY)
Admission: RE | Admit: 2017-07-25 | Discharge: 2017-07-25 | Disposition: A | Discharge status: Home / Self Care | Payer: Medicare Other | Source: Ambulatory Visit | Attending: Internal Medicine | Admitting: Internal Medicine

## 2017-07-25 ENCOUNTER — Encounter (HOSPITAL_COMMUNITY): Payer: Self-pay

## 2017-07-25 DIAGNOSIS — C61 Malignant neoplasm of prostate: Secondary | ICD-10-CM | POA: Insufficient documentation

## 2017-07-25 DIAGNOSIS — R937 Abnormal findings on diagnostic imaging of other parts of musculoskeletal system: Secondary | ICD-10-CM | POA: Insufficient documentation

## 2017-07-25 MED ORDER — TECHNETIUM TC 99M MEDRONATE IV KIT
25.0000 | PACK | Freq: Once | INTRAVENOUS | Status: AC | PRN
  Administered 2017-07-25: 21.6 via INTRAVENOUS

## 2017-07-26 ENCOUNTER — Other Ambulatory Visit: Payer: Self-pay

## 2017-07-26 ENCOUNTER — Inpatient Hospital Stay (HOSPITAL_COMMUNITY): Payer: Medicare Other | Attending: Internal Medicine | Admitting: Internal Medicine

## 2017-07-26 ENCOUNTER — Inpatient Hospital Stay (HOSPITAL_COMMUNITY): Payer: Medicare Other | Attending: Internal Medicine

## 2017-07-26 ENCOUNTER — Encounter (HOSPITAL_COMMUNITY): Payer: Self-pay | Admitting: Internal Medicine

## 2017-07-26 ENCOUNTER — Encounter: Payer: Self-pay | Admitting: General Practice

## 2017-07-26 VITALS — BP 111/65 | HR 86 | Temp 97.5°F | Resp 18 | Ht 72.0 in | Wt 161.0 lb

## 2017-07-26 VITALS — Ht 72.0 in | Wt 161.0 lb

## 2017-07-26 DIAGNOSIS — E538 Deficiency of other specified B group vitamins: Secondary | ICD-10-CM | POA: Diagnosis present

## 2017-07-26 DIAGNOSIS — Z5189 Encounter for other specified aftercare: Secondary | ICD-10-CM | POA: Diagnosis not present

## 2017-07-26 DIAGNOSIS — Z5111 Encounter for antineoplastic chemotherapy: Secondary | ICD-10-CM | POA: Insufficient documentation

## 2017-07-26 DIAGNOSIS — C7951 Secondary malignant neoplasm of bone: Secondary | ICD-10-CM | POA: Insufficient documentation

## 2017-07-26 DIAGNOSIS — C61 Malignant neoplasm of prostate: Secondary | ICD-10-CM | POA: Insufficient documentation

## 2017-07-26 MED ORDER — PEGFILGRASTIM 6 MG/0.6ML ~~LOC~~ PSKT: 6.0000 mg | PREFILLED_SYRINGE | Freq: Once | SUBCUTANEOUS | Status: DC

## 2017-07-26 MED ORDER — HEPARIN SOD (PORK) LOCK FLUSH 100 UNIT/ML IV SOLN
INTRAVENOUS | Status: AC
  Filled 2017-07-26: qty 5

## 2017-07-26 MED ORDER — SODIUM CHLORIDE 0.9% FLUSH: 10.0000 mL | INTRAVENOUS | Status: DC | PRN

## 2017-07-26 MED ORDER — DEXTROSE 5 % IV SOLN: 20.0000 mg/m2 | Freq: Once | INTRAVENOUS | Status: DC

## 2017-07-26 MED ORDER — HEPARIN SOD (PORK) LOCK FLUSH 100 UNIT/ML IV SOLN: 500.0000 [IU] | Freq: Once | INTRAVENOUS | Status: DC | PRN

## 2017-07-26 MED ORDER — DIPHENHYDRAMINE HCL 50 MG/ML IJ SOLN: 25.0000 mg | Freq: Once | INTRAMUSCULAR | Status: DC

## 2017-07-26 MED ORDER — FAMOTIDINE IN NACL 20-0.9 MG/50ML-% IV SOLN: 20.0000 mg | Freq: Once | INTRAVENOUS | Status: DC

## 2017-07-26 MED ORDER — SODIUM CHLORIDE 0.9 % IV SOLN
Freq: Once | INTRAVENOUS | Status: DC
Start: 2017-07-26 — End: 2017-07-26

## 2017-07-26 MED ORDER — DEXAMETHASONE SODIUM PHOSPHATE 10 MG/ML IJ SOLN: 10.0000 mg | Freq: Once | INTRAMUSCULAR | Status: DC

## 2017-07-26 NOTE — Progress Notes (Signed)
Diagnosis No diagnosis found.  Staging Cancer Staging No matching staging information was found for the patient.  Assessment and Plan:  1.  Prostate cancer metastatic to multiple sites.  72 y.o. 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 Lebron Conners.  Currently undergoing palliative systemic chemotherapy with cabazitaxel and supportive therapy with denosumab.  PSA stable at 0.96 on labs done 06/2017.    Patient is here today for follow-up to go over bone scan done 07/25/2017 that showed: IMPRESSION: Overall, there is again noted to be multiple foci of increased activity in the skeleton compatible with metastatic disease. Some areas are stable while other areas in the axial skeleton have increased suggesting worsening disease.  Areas mentioned to be increased were right mandible, left glenoid region, and right lateral malleolus.  The patient reports recent fall on the right knee based on a stable PSA of 0.96, he will continue cabazitaxel..  Last CT scan of chest abdomen pelvis was done August 2018 and showed metastatic bony disease but no visceral disease.  He will continue treatment every 3 weeks.    2.  Bone mets.  Bone scan done 07/25/2017 showed: IMPRESSION: Overall, there is again noted to be multiple foci of increased activity in the skeleton compatible with metastatic disease. Some areas are stable while other areas in the axial skeleton have increased suggesting worsening disease.  Areas mentioned to be increased were right mandible, left glenoid region, and right lateral malleolus.  The patient reports recent fall on the right knee.   Based on a stable PSA of 0.96, he will continue cabazitaxel..  Last CT scan of chest abdomen pelvis was done August 2018 and showed metastatic bony disease but no visceral disease.  He will continue treatment every 3 weeks. He will continue Xgeva.  We will repeat CT chest abdomen pelvis in August 2019.     3.  PAC concerns.    Pt has been seen by surgery.  He tested + for Cocaine so removal as cancelled.  Port site looks worse today.  Chemotherapy is held today.  Patient is afebrile, white count is 8.7.  He is reportedly scheduled for new port on August 08, 2017.  Will resume chemotherapy once new port has been placed.  4.  + drug screen.  Pt tested + for cocaine.  He is being seen by social work today.  Current Status:  Pt is here today for follow-up to go over recent bone scan.       Prostate cancer metastatic to multiple sites [facility])   01/26/2014 Tumor Marker    PSA > 5000      01/28/2014 Initial Biopsy    Metastatic adenocarcinoma of prostate      02/05/2014 Surgery    Bilateral orchiectomy by Dr. Junious Silk      02/26/2014 Tumor Marker    PSA = 399.5      03/14/2014 Tumor Marker    PSA= 89.51      03/14/2014 - 06/26/2014 Chemotherapy    Docetaxel 75 mg/kg every 21 days x 6 cycles      04/24/2014 Tumor Marker    PSA- 19.89      07/23/2014 Procedure    Nephrostomy tube removed by IR, Dr. Geroge Baseman      07/24/2014 Tumor Marker    PSA= 6.15      10/16/2014 Tumor Marker    PSA: 3.54       01/08/2015 Tumor Marker    PSA: 2.13  01/17/2015 Imaging    CT CAP-  Massive pelvic and retroperitoneal lymphadenopathy noted on the prior study has nearly completely resolved, now with only a small amount of residual amorphous soft tissue predominantly around the the infrarenal abdominal aorta.      01/17/2015 Imaging    Bone scan- Widespread osseous metastatic disease with multiple foci of increased activity throughout the skeleton, corresponding with the findings on the prior CT from October, 2015.      04/11/2015 Tumor Marker    PSA: 1.87       07/21/2015 Imaging    Bone scan- Bony metastatic disease again noted at multiple sites, stable from prior study. No progression of bony metastatic disease is demonstrable on this study.      07/25/2015 Imaging    CT CAP-  Stable matted soft tissue density in the retroperitoneum and extraperitoneal pelvis consistent with treated disease. No recurrent lymphadenopathy.  No pulmonary metastatic disease. Diffuse stable sclerotic metastatic bone disease.      01/14/2016 Imaging    Bone scan-  Multiple sites of abnormal increased tracer localization involving BILATERAL ribs, T11, T12, questionably RIGHT scapula and LEFT iliac bone suspicious for osseous metastases.      01/23/2016 Imaging    CT CAP- 1. Similar widespread osseous metastasis. 2. Similar soft tissue thickening within the retroperitoneum of the abdomen. Improved left pelvic side wall soft tissue thickening. These are consistent with sites of treated disease. No well-defined adenopathy. 3. No new sites of disease.      05/03/2016 -  Chemotherapy    Jevtana every 21 days       09/06/2016 Imaging    Restaging CT C/A/P: IMPRESSION: 1. Similar appearance of widespread sclerotic bone metastases. 2. No change in soft tissue thickening within the retroperitoneum and left pelvic sidewall. No well defined adenopathy identified. 3. No new sites of disease 4. Aortic Atherosclerosis (ICD10-I70.0). Coronary artery calcifications noted. 5. 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 6. Stable 9 mm pancreatic cystic lesion. Favor pseudocyst. Indolent neoplasm may look similar. Attention on follow-up imaging.      09/06/2016 Imaging    Bone Scan: IMPRESSION: In this patient with known diffuse sclerotic metastatic disease, bone scan does not reveal progression of radiotracer uptake. Several of the areas of previously demonstrated radiotracer uptake appear less prominent.  Change in appearance of radiotracer uptake involving the mandible now more notable on the right and previously more notable on the left may reflect result of dental disease rather than metastatic disease.       04/08/2017  Tumor Marker    PSA 0.89        Problem List Patient Active Problem List   Diagnosis Date Noted  . B12 deficiency [E53.8] 04/01/2016  . Malnutrition of moderate degree ([place]) [E44.0] 05/24/2014  . Bacteremia [R78.81] 05/24/2014  . Pyrexia [R50.9]   . Sepsis ([place]) [A41.9] 05/23/2014  . UTI (lower urinary tract infection) [N39.0] 05/23/2014  . Health care maintenance [Z00.00] 02/06/2014  . Homelessness [Z59.0] 01/31/2014  . Prostate cancer metastatic to multiple sites ([place]) [C61] 01/30/2014  . Bone lesion [M89.9] 01/26/2014  . Acute renal failure ([place]) [N17.9] 01/26/2014  . Bilateral hydronephrosis [N13.30] 01/26/2014  . Anemia of chronic disease [D63.8] 01/26/2014  . History of cocaine abuse [Z87.898] 01/26/2014    Past Medical History Past Medical History:  Diagnosis Date  . Acute renal failure ([place]) 01/26/2014  . Alcohol abuse   . Anemia of chronic disease 01/26/2014  . Bilateral  hydronephrosis 01/26/2014  . History of cocaine abuse 01/26/2014  . Homelessness 01/31/2014  . Prostate cancer metastatic to multiple sites ([place]) 01/30/2014  . Sepsis ([place])   . UTI (lower urinary tract infection)     Past Surgical History Past Surgical History:  Procedure Laterality Date  . ORCHIECTOMY Bilateral 02/05/2014   Procedure: BILATERAL ORCHIECTOMY;  Surgeon: Festus Aloe, MD;  Location: [facility];  Service: Urology;  Laterality: Bilateral;  . PERCUTANEOUS NEPHROSTOMY Bilateral    IR Dr. Junious Silk changed on 04/22/2014  . PORTACATH PLACEMENT Right 03/12/14    Family History Family History  Problem Relation Age of Onset  . Cancer Brother 38     Social History  reports that he has been smoking cigarettes.  He has a 13.75 pack-year smoking history. He has never used smokeless tobacco. He reports that he drinks about 0.6 oz of alcohol per week. He reports that he has current or past drug history. Drug: Cocaine.  Medications  Current Outpatient Medications:  .  Cabazitaxel (JEVTANA  IV), Inject into the vein. Every 3 weeks, Disp: , Rfl:  .  lidocaine-prilocaine (EMLA) cream, Apply a quarter size amount to port site 1 hour prior to chemo. Do not rub in. Cover with plastic wrap. (Patient not taking: Reported on 07/26/2017), Disp: 30 g, Rfl: 3 .  metoCLOPramide (REGLAN) 5 MG tablet, The day after chemo take 1 tab four times a day x 2 days. Then may take 1 tab four times a day if needed for nausea/vomiting. (Patient not taking: Reported on 07/26/2017), Disp: 60 tablet, Rfl: 2 .  ondansetron (ZOFRAN) 8 MG tablet, Take 1 tablet (8 mg total) by mouth every 8 (eight) hours as needed for nausea or vomiting. (Patient not taking: Reported on 07/26/2017), Disp: 30 tablet, Rfl: 2 .  prochlorperazine (COMPAZINE) 10 MG tablet, The day after chemo take 1 tab four times a day x 2 days. Then may take 1 tab four times a day if needed for nausea/vomiting. (Patient not taking: Reported on 07/26/2017), Disp: 60 tablet, Rfl: 2 No current facility-administered medications for this visit.   Facility-Administered Medications Ordered in Other Visits:  .  0.9 %  sodium chloride infusion, , Intravenous, Once, Vann Okerlund, Mathis Dad, MD .  cyanocobalamin ((VITAMIN B-12)) injection 1,000 mcg, 1,000 mcg, Intramuscular, Once, Kefalas, Thomas S, PA-C .  denosumab (XGEVA) injection 120 mg, 120 mg, Subcutaneous, Once, Kefalas, Thomas S, PA-C .  dexamethasone (DECADRON) injection 10 mg, 10 mg, Intravenous, Once, Caitlynn Ju, Mathis Dad, MD .  diphenhydrAMINE (BENADRYL) injection 25 mg, 25 mg, Intravenous, Once, Searra Carnathan, MD .  famotidine (PEPCID) IVPB 20 mg premix, 20 mg, Intravenous, Once, Caralina Nop, MD .  heparin lock flush 100 unit/mL, 500 Units, Intracatheter, Once PRN, Odeal Welden, MD .  sodium chloride flush (NS) 0.9 % injection 10 mL, 10 mL, Intracatheter, PRN, Juwan Vences, Mathis Dad, MD  Allergies Patient has no known allergies.  Review of Systems Review of Systems - Oncology ROS as per HPI otherwise 12 point ROS is  negative.   Physical Exam  Vitals Wt Readings from Last 3 Encounters:  07/26/17 161 lb (73 kg)  07/26/17 161 lb (73 kg)  06/27/17 166 lb 3.2 oz (75.4 kg)   Temp Readings from Last 3 Encounters:  07/26/17 (!) 97.5 F (36.4 C) (Oral)  06/27/17 97.7 F (36.5 C) (Oral)  06/27/17 97.8 F (36.6 C) (Oral)   BP Readings from Last 3 Encounters:  07/26/17 111/65  06/27/17 104/63  06/27/17 125/73   Pulse Readings from Last  3 Encounters:  07/26/17 86  06/27/17 87  06/27/17 65    Constitutional: Well-developed, well-nourished, and in no distress.   HENT: Head: Normocephalic and atraumatic.  Mouth/Throat: No oropharyngeal exudate. Mucosa moist. Eyes: Pupils are equal, round, and reactive to light. Conjunctivae are normal. No scleral icterus.  Neck: Normal range of motion. Neck supple. No JVD present.  Cardiovascular: Normal rate, regular rhythm and normal heart sounds.  Exam reveals no gallop and no friction rub.   No murmur heard. Pulmonary/Chest: Effort normal and breath sounds normal. No respiratory distress. No wheezes.No rales.  Abdominal: Soft. Bowel sounds are normal. No distension. There is no tenderness. There is no guarding.  Musculoskeletal: No edema or tenderness.  Lymphadenopathy: No cervical, axillary or supraclavicular adenopathy.  Neurological: Alert and oriented to person, place, and time. No cranial nerve deficit.  Skin: Skin is warm and dry. No rash noted. No erythema. No pallor.  Psychiatric: Affect and judgment normal.  Right port site has significant scarring noted.  Labs No visits with results within 3 Day(s) from this visit.  Latest known visit with results is:  Appointment on 07/21/2017  Component Date Value Ref Range Status  . WBC 07/21/2017 8.7  4.0 - 10.5 K/uL Final  . RBC 07/21/2017 3.69* 4.22 - 5.81 MIL/uL Final  . Hemoglobin 07/21/2017 10.7* 13.0 - 17.0 g/dL Final  . HCT 07/21/2017 33.4* 39.0 - 52.0 % Final  . MCV 07/21/2017 90.5  78.0 - 100.0  fL Final  . MCH 07/21/2017 29.0  26.0 - 34.0 pg Final  . MCHC 07/21/2017 32.0  30.0 - 36.0 g/dL Final  . RDW 07/21/2017 15.4  11.5 - 15.5 % Final  . Platelets 07/21/2017 558* 150 - 400 K/uL Final  . Neutrophils Relative % 07/21/2017 66  % Final  . Neutro Abs 07/21/2017 5.8  1.7 - 7.7 K/uL Final  . Lymphocytes Relative 07/21/2017 15  % Final  . Lymphs Abs 07/21/2017 1.3  0.7 - 4.0 K/uL Final  . Monocytes Relative 07/21/2017 19  % Final  . Monocytes Absolute 07/21/2017 1.7* 0.1 - 1.0 K/uL Final  . Eosinophils Relative 07/21/2017 0  % Final  . Eosinophils Absolute 07/21/2017 0.0  0.0 - 0.7 K/uL Final  . Basophils Relative 07/21/2017 0  % Final  . Basophils Absolute 07/21/2017 0.0  0.0 - 0.1 K/uL Final   Performed at [facility], [address], [place], [place] [address]  . Sodium 07/21/2017 133* 135 - 145 mmol/L Final  . Potassium 07/21/2017 5.0  3.5 - 5.1 mmol/L Final  . Chloride 07/21/2017 99* 101 - 111 mmol/L Final  . CO2 07/21/2017 21* 22 - 32 mmol/L Final  . Glucose, Bld 07/21/2017 96  65 - 99 mg/dL Final  . BUN 07/21/2017 23* 6 - 20 mg/dL Final  . Creatinine, Ser 07/21/2017 1.83* 0.61 - 1.24 mg/dL Final  . Calcium 07/21/2017 8.9  8.9 - 10.3 mg/dL Final  . Total Protein 07/21/2017 9.0* 6.5 - 8.1 g/dL Final  . Albumin 07/21/2017 3.4* 3.5 - 5.0 g/dL Final  . AST 07/21/2017 24  15 - 41 U/L Final  . ALT 07/21/2017 17  17 - 63 U/L Final  . Alkaline Phosphatase 07/21/2017 60  38 - 126 U/L Final  . Total Bilirubin 07/21/2017 0.8  0.3 - 1.2 mg/dL Final  . GFR calc non Af Amer 07/21/2017 36* >60 mL/min Final  . GFR calc Af Amer 07/21/2017 41* >60 mL/min Final   Comment: (NOTE) The eGFR has been calculated using  the CKD EPI equation. This calculation has not been validated in all clinical situations. eGFR's persistently <60 mL/min signify possible Chronic Kidney Disease.   Georgiann Hahn gap 07/21/2017 13  5 - 15 Final   Performed at [facility], [address] [place], [place] [address]   . Prostatic Specific Antigen 07/21/2017 0.96  0.00 - 4.00 ng/mL Final   Comment: (NOTE) While PSA levels of <=4.0 ng/ml are reported as reference range, some men with levels below 4.0 ng/ml can have prostate cancer and many men with PSA above 4.0 ng/ml do not have prostate cancer.  Other tests such as free PSA, age specific reference ranges, PSA velocity and PSA doubling time may be helpful especially in men less than 23 years old. Performed at [facility], [place] [address] [place], [place] [address]      Pathology No orders of the defined types were placed in this encounter.      Zoila Shutter MD

## 2017-07-26 NOTE — Progress Notes (Signed)
Patient seen by Dr. Walden Field for follow up and treatment.  Ok to treat today.  Labs completed 07/21/17. Patient stated back pain but no worsening.   More discomfort when working and moving around but gets relief when he is home and resting.    Patients port site reddened and swollen.  Port catheter tubing seen due to skin erosion.  No drainage noted.  Patient denied fevers and chills.  Patient scheduled for next week for port placement.  Dr. Walden Field asked to look at port and patients treatment held for today and rescheduled after new port placement.  Patient verbalized understanding.

## 2017-07-26 NOTE — Progress Notes (Signed)
[place] Work  Clinical Social Work was referred by Futures trader for assessment of psychosocial needs.  Clinical Social Worker met with patient at [facility] to offer support and assess for needs.  Major concern was positive drug screen (cocaine) which prevented completion of surgery to address issues w port.  Surgery has been rescheduled for April 15, patient aware that he must have a negative UDS for all substances in order for surgery to proceed.  Patient states that he has been "clean since I got this cancer diagnosis.  I just went to a party where they were smoking crack, that's how it got into me."  While this may be a possibility, CSW stressed that a positive UDS is evidence that the substance is in his body and can therefore interact w anesthesia and other drugs used by physicians in treating his cancer.  Stressed the importance of avoiding all contact w unprescribed substances by any route, including inhalation, while in active treatment for cancer.  Patient continues to state that he has not used unprescribed substances, including THC, cocaine, opiates or any other illicit drugs, other than instance where he was at party in presence of cocaine use.  CSW and patient discussed what he would do next time - "I would just leave and not be around that stuff, I'd leave the party."  Admits to drinking alcohol - 6 pakc of 12 ox beer/weekend and several shots of liquor "when Im playing cards."  Patient is motivated to have port issues addressed via surgery, voices understanding of concern for possible drug/drug interactions raised by positive UDS.  CSW also reviewed other psychosocial stressors - patient works at physically demanding job and has continued to work throughout treatment, has stable housing at [employer], has access to transportation, has social support from girlfriend and employer, income is adequate to meet his needs.  Has not had PCP in quite some time - is  interested in finding one that is in network.  Can be referred to [employer] for help w this need.  No other concerns at this time.m     Clinical Social Work interventions: Brief assessment of substance abuse concerns Referral for PCP/[facility] to assist Brief assessment of other psychosocial needs Motivational interviewing  Edwyna Shell, [place] Social Worker Phone:  [phone]

## 2017-07-28 NOTE — H&P (Signed)
Jeff [name]; [id number]; 08-14-1945   HPI Patient is a 72 year old black male who was referred to my care by the oncologist at [facility] for removal of an old Port-A-Cath and replacement.  Patient suffers from prostate cancer.  The tunneled catheter is starting to erode through the skin.  He was placed on antibiotics.  He now presents for removal of the old Port-A-Cath and placement of a new Port-A-Cath.  He currently has no pain. Past Medical History:  Diagnosis Date  . Acute renal failure ([place]) 01/26/2014  . Alcohol abuse   . Anemia of chronic disease 01/26/2014  . Bilateral hydronephrosis 01/26/2014  . History of cocaine abuse 01/26/2014  . Homelessness 01/31/2014  . Prostate cancer metastatic to multiple sites ([place]) 01/30/2014  . Sepsis ([place])   . UTI (lower urinary tract infection)     Past Surgical History:  Procedure Laterality Date  . ORCHIECTOMY Bilateral 02/05/2014   Procedure: BILATERAL ORCHIECTOMY;  Surgeon: Festus Aloe, MD;  Location: [facility];  Service: Urology;  Laterality: Bilateral;  . PERCUTANEOUS NEPHROSTOMY Bilateral    IR Dr. Junious Silk changed on 04/22/2014  . PORTACATH PLACEMENT Right 03/12/14    Family History  Problem Relation Age of Onset  . Cancer Brother 76    Current Outpatient Medications on File Prior to Visit  Medication Sig Dispense Refill  . Cabazitaxel (JEVTANA IV) Inject into the vein. Every 3 weeks    . calcium-vitamin D (OSCAL WITH D) 500-200 MG-UNIT tablet Take 2 tablets by mouth daily.    . cephALEXin (KEFLEX) 500 MG capsule Take 1 capsule (500 mg total) by mouth 4 (four) times daily. 28 capsule 0  . lidocaine-prilocaine (EMLA) cream Apply a quarter size amount to port site 1 hour prior to chemo. Do not rub in. Cover with plastic wrap. 30 g 3  . metoCLOPramide (REGLAN) 5 MG tablet The day after chemo take 1 tab four times a day x 2 days. Then may take 1 tab four times a day if needed for nausea/vomiting. 60 tablet 2  . ondansetron  (ZOFRAN) 8 MG tablet Take 1 tablet (8 mg total) by mouth every 8 (eight) hours as needed for nausea or vomiting. 30 tablet 2  . oxyCODONE (ROXICODONE) 15 MG immediate release tablet Take 1 tablet (15 mg total) by mouth every 4 (four) hours as needed for pain. (Patient taking differently: Take 15-30 mg by mouth 2 (two) times daily as needed for pain (1-2 tablets depending on pain level). ) 60 tablet 0  . predniSONE (DELTASONE) 10 MG tablet Take 1 tablet (10 mg total) by mouth daily with breakfast. 30 tablet 3  . prochlorperazine (COMPAZINE) 10 MG tablet The day after chemo take 1 tab four times a day x 2 days. Then may take 1 tab four times a day if needed for nausea/vomiting. 60 tablet 2   Current Facility-Administered Medications on File Prior to Visit  Medication Dose Route Frequency Provider Last Rate Last Dose  . cyanocobalamin ((VITAMIN B-12)) injection 1,000 mcg  1,000 mcg Intramuscular Once Kefalas, Thomas S, PA-C      . denosumab (XGEVA) injection 120 mg  120 mg Subcutaneous Once Kefalas, Thomas S, PA-C        No Known Allergies  Social History   Substance and Sexual Activity  Alcohol Use No  . Alcohol/week: 0.6 oz  . Types: 1 Cans of beer per week   Comment: none for at least 7 -8 years    Social History   Tobacco  Use  Smoking Status Current Every Day Smoker  . Packs/day: 0.25  . Years: 55.00  . Pack years: 13.75  . Types: Cigarettes  Smokeless Tobacco Never Used    Review of Systems  Constitutional: Negative.   HENT: Negative.   Eyes: Negative.   Respiratory: Negative.   Cardiovascular: Negative.   Gastrointestinal: Negative.   Genitourinary: Negative.   Musculoskeletal: Negative.   Skin: Negative.   Neurological: Negative.   Endo/Heme/Allergies: Negative.   Psychiatric/Behavioral: Negative.     Objective   Vitals:   06/09/17 0916  BP: 105/69  Pulse: 89  Temp: 99.3 F (37.4 C)    Physical Exam  Constitutional: He is oriented to person, place, and  time and well-developed, well-nourished, and in no distress.  HENT:  Head: Normocephalic and atraumatic.  Cardiovascular: Normal rate, regular rhythm and normal heart sounds. Exam reveals no gallop and no friction rub.  No murmur heard. Pulmonary/Chest: Effort normal and breath sounds normal. No respiratory distress. He has no wheezes. He has no rales.    Port-A-Cath in place in the right upper chest with skin erosion along the tunneled track.  No purulent drainage present.  The port pocket is without fluctuance.  Neurological: He is alert and oriented to person, place, and time.  Vitals reviewed.   Assessment  Dysfunctional Port-A-Cath, prostate cancer Plan   Patient is scheduled for Port-A-Cath removal and placement of a new Port-A-Cath on 08/08/2017.  The risks and benefits of the procedure including bleeding, infection, and pneumothorax were fully explained to the patient, who gave informed consent.

## 2017-08-01 ENCOUNTER — Other Ambulatory Visit: Payer: Self-pay

## 2017-08-01 ENCOUNTER — Encounter (HOSPITAL_COMMUNITY): Payer: Self-pay

## 2017-08-01 ENCOUNTER — Inpatient Hospital Stay (HOSPITAL_COMMUNITY): Payer: Medicare Other

## 2017-08-01 VITALS — BP 118/65 | HR 82 | Temp 97.4°F | Resp 18

## 2017-08-01 DIAGNOSIS — C61 Malignant neoplasm of prostate: Secondary | ICD-10-CM

## 2017-08-01 DIAGNOSIS — E538 Deficiency of other specified B group vitamins: Secondary | ICD-10-CM

## 2017-08-01 DIAGNOSIS — Z5111 Encounter for antineoplastic chemotherapy: Secondary | ICD-10-CM | POA: Diagnosis not present

## 2017-08-01 DIAGNOSIS — M899 Disorder of bone, unspecified: Secondary | ICD-10-CM

## 2017-08-01 LAB — COMPREHENSIVE METABOLIC PANEL
ALBUMIN: 3.3 g/dL — AB (ref 3.5–5.0)
ALT: 12 U/L — ABNORMAL LOW (ref 17–63)
AST: 16 U/L (ref 15–41)
Alkaline Phosphatase: 57 U/L (ref 38–126)
Anion gap: 9 (ref 5–15)
BILIRUBIN TOTAL: 0.4 mg/dL (ref 0.3–1.2)
BUN: 19 mg/dL (ref 6–20)
CALCIUM: 9.2 mg/dL (ref 8.9–10.3)
CO2: 26 mmol/L (ref 22–32)
Chloride: 101 mmol/L (ref 101–111)
Creatinine, Ser: 1.96 mg/dL — ABNORMAL HIGH (ref 0.61–1.24)
GFR calc Af Amer: 38 mL/min — ABNORMAL LOW (ref >60)
GFR, EST NON AFRICAN AMERICAN: 33 mL/min — AB (ref >60)
GLUCOSE: 103 mg/dL — AB (ref 65–99)
Potassium: 4.9 mmol/L (ref 3.5–5.1)
Sodium: 136 mmol/L (ref 135–145)
TOTAL PROTEIN: 8.5 g/dL — AB (ref 6.5–8.1)

## 2017-08-01 LAB — CBC WITH DIFFERENTIAL/PLATELET
BASOS ABS: 0 10*3/uL (ref 0.0–0.1)
BASOS PCT: 0 %
Eosinophils Absolute: 0.1 10*3/uL (ref 0.0–0.7)
Eosinophils Relative: 2 %
HEMATOCRIT: 32.9 % — AB (ref 39.0–52.0)
HEMOGLOBIN: 10.4 g/dL — AB (ref 13.0–17.0)
LYMPHS PCT: 26 %
Lymphs Abs: 1.7 10*3/uL (ref 0.7–4.0)
MCH: 28.5 pg (ref 26.0–34.0)
MCHC: 31.6 g/dL (ref 30.0–36.0)
MCV: 90.1 fL (ref 78.0–100.0)
MONOS PCT: 13 %
Monocytes Absolute: 0.8 10*3/uL (ref 0.1–1.0)
NEUTROS ABS: 3.8 10*3/uL (ref 1.7–7.7)
NEUTROS PCT: 59 %
Platelets: 534 10*3/uL — ABNORMAL HIGH (ref 150–400)
RBC: 3.65 MIL/uL — ABNORMAL LOW (ref 4.22–5.81)
RDW: 15.3 % (ref 11.5–15.5)
WBC: 6.5 10*3/uL (ref 4.0–10.5)

## 2017-08-01 LAB — LACTATE DEHYDROGENASE: LDH: 158 U/L (ref 98–192)

## 2017-08-01 MED ORDER — CYANOCOBALAMIN 1000 MCG/ML IJ SOLN
1000.0000 ug | Freq: Once | INTRAMUSCULAR | Status: AC
  Administered 2017-08-01: 1000 ug via INTRAMUSCULAR
  Filled 2017-08-01: qty 1

## 2017-08-01 MED ORDER — DENOSUMAB 120 MG/1.7ML ~~LOC~~ SOLN
120.0000 mg | Freq: Once | SUBCUTANEOUS | Status: AC
  Administered 2017-08-01: 120 mg via SUBCUTANEOUS
  Filled 2017-08-01: qty 1.7

## 2017-08-01 NOTE — Progress Notes (Signed)
Jeff [name] presents today for injection per the provider's orders.  Xgeva and B12 administrations without incident; see MAR for injection details.  Patient tolerated procedure well and without incident.  No questions or complaints noted at this time.  Discharged ambulatory.

## 2017-08-02 MED ORDER — CEFAZOLIN SODIUM-DEXTROSE 2-4 GM/100ML-% IV SOLN: 2.0000 g | INTRAVENOUS | Status: AC

## 2017-08-02 MED ORDER — CHLORHEXIDINE GLUCONATE CLOTH 2 % EX PADS: 6.0000 | MEDICATED_PAD | Freq: Once | CUTANEOUS | Status: DC

## 2017-08-02 MED ORDER — OCTREOTIDE ACETATE 30 MG IM KIT
PACK | INTRAMUSCULAR | Status: AC
  Filled 2017-08-02: qty 1

## 2017-08-03 ENCOUNTER — Encounter (HOSPITAL_COMMUNITY)
Admission: RE | Admit: 2017-08-03 | Discharge: 2017-08-03 | Disposition: A | Discharge status: Home / Self Care | Payer: Medicare Other | Source: Ambulatory Visit | Attending: General Surgery | Admitting: General Surgery

## 2017-08-04 ENCOUNTER — Encounter (HOSPITAL_COMMUNITY): Payer: Self-pay

## 2017-08-08 ENCOUNTER — Ambulatory Visit (HOSPITAL_COMMUNITY): Payer: Medicare Other

## 2017-08-08 ENCOUNTER — Other Ambulatory Visit: Payer: Self-pay

## 2017-08-08 ENCOUNTER — Encounter (HOSPITAL_COMMUNITY)
Admission: RE | Disposition: A | Discharge status: Home / Self Care | Payer: Self-pay | Source: Ambulatory Visit | Attending: General Surgery

## 2017-08-08 ENCOUNTER — Encounter (HOSPITAL_COMMUNITY): Payer: Self-pay | Admitting: *Deleted

## 2017-08-08 ENCOUNTER — Ambulatory Visit (HOSPITAL_COMMUNITY)
Admission: RE | Admit: 2017-08-08 | Discharge: 2017-08-08 | Disposition: A | Discharge status: Home / Self Care | Payer: Medicare Other | Source: Ambulatory Visit | Attending: General Surgery | Admitting: General Surgery

## 2017-08-08 ENCOUNTER — Ambulatory Visit (HOSPITAL_COMMUNITY): Payer: Medicare Other | Admitting: Anesthesiology

## 2017-08-08 DIAGNOSIS — Z79891 Long term (current) use of opiate analgesic: Secondary | ICD-10-CM | POA: Diagnosis not present

## 2017-08-08 DIAGNOSIS — C799 Secondary malignant neoplasm of unspecified site: Secondary | ICD-10-CM | POA: Diagnosis not present

## 2017-08-08 DIAGNOSIS — Z95828 Presence of other vascular implants and grafts: Secondary | ICD-10-CM

## 2017-08-08 DIAGNOSIS — Y831 Surgical operation with implant of artificial internal device as the cause of abnormal reaction of the patient, or of later complication, without mention of misadventure at the time of the procedure: Secondary | ICD-10-CM | POA: Diagnosis not present

## 2017-08-08 DIAGNOSIS — F17210 Nicotine dependence, cigarettes, uncomplicated: Secondary | ICD-10-CM | POA: Diagnosis not present

## 2017-08-08 DIAGNOSIS — Z79899 Other long term (current) drug therapy: Secondary | ICD-10-CM | POA: Diagnosis not present

## 2017-08-08 DIAGNOSIS — Z7952 Long term (current) use of systemic steroids: Secondary | ICD-10-CM | POA: Diagnosis not present

## 2017-08-08 DIAGNOSIS — T80212D Local infection due to central venous catheter, subsequent encounter: Secondary | ICD-10-CM

## 2017-08-08 DIAGNOSIS — T82514A Breakdown (mechanical) of infusion catheter, initial encounter: Secondary | ICD-10-CM | POA: Diagnosis present

## 2017-08-08 DIAGNOSIS — C61 Malignant neoplasm of prostate: Secondary | ICD-10-CM

## 2017-08-08 DIAGNOSIS — T80212A Local infection due to central venous catheter, initial encounter: Secondary | ICD-10-CM

## 2017-08-08 HISTORY — PX: PORT-A-CATH REMOVAL: SHX5289

## 2017-08-08 HISTORY — PX: PORTACATH PLACEMENT: SHX2246

## 2017-08-08 LAB — RAPID URINE DRUG SCREEN, HOSP PERFORMED
Amphetamines: NOT DETECTED
BARBITURATES: NOT DETECTED
Benzodiazepines: NOT DETECTED
COCAINE: NOT DETECTED
Opiates: NOT DETECTED
TETRAHYDROCANNABINOL: NOT DETECTED

## 2017-08-08 SURGERY — INSERTION, TUNNELED CENTRAL VENOUS DEVICE, WITH PORT
Anesthesia: Monitor Anesthesia Care | Site: Chest | Laterality: Right

## 2017-08-08 MED ORDER — FENTANYL CITRATE (PF) 250 MCG/5ML IJ SOLN
INTRAMUSCULAR | Status: AC
  Filled 2017-08-08: qty 5

## 2017-08-08 MED ORDER — CEFAZOLIN SODIUM-DEXTROSE 2-4 GM/100ML-% IV SOLN
2.0000 g | INTRAVENOUS | Status: AC
  Administered 2017-08-08: 2 g via INTRAVENOUS
  Filled 2017-08-08: qty 100

## 2017-08-08 MED ORDER — MIDAZOLAM HCL 2 MG/2ML IJ SOLN
INTRAMUSCULAR | Status: AC
Start: 2017-08-08 — End: 2017-08-08
  Filled 2017-08-08: qty 2

## 2017-08-08 MED ORDER — PROMETHAZINE HCL 25 MG/ML IJ SOLN: 6.2500 mg | INTRAMUSCULAR | Status: DC | PRN

## 2017-08-08 MED ORDER — KETOROLAC TROMETHAMINE 30 MG/ML IJ SOLN
30.0000 mg | Freq: Once | INTRAMUSCULAR | Status: AC
  Administered 2017-08-08: 30 mg via INTRAVENOUS
  Filled 2017-08-08: qty 1

## 2017-08-08 MED ORDER — PROPOFOL 10 MG/ML IV BOLUS
INTRAVENOUS | Status: AC
  Filled 2017-08-08: qty 40

## 2017-08-08 MED ORDER — PROPOFOL 10 MG/ML IV BOLUS
INTRAVENOUS | Status: AC
  Filled 2017-08-08: qty 20

## 2017-08-08 MED ORDER — SODIUM CHLORIDE 0.9 % IV SOLN
INTRAVENOUS | Status: AC | PRN
  Administered 2017-08-08: 500 mL via INTRAMUSCULAR

## 2017-08-08 MED ORDER — HYDROCODONE-ACETAMINOPHEN 5-325 MG PO TABS: 1.0000 | ORAL_TABLET | Freq: Four times a day (QID) | ORAL | 0 refills | Status: DC | PRN

## 2017-08-08 MED ORDER — MIDAZOLAM HCL 2 MG/2ML IJ SOLN: 0.5000 mg | Freq: Once | INTRAMUSCULAR | Status: DC | PRN

## 2017-08-08 MED ORDER — HYDROMORPHONE HCL 1 MG/ML IJ SOLN: 0.2500 mg | INTRAMUSCULAR | Status: DC | PRN

## 2017-08-08 MED ORDER — HEPARIN SOD (PORK) LOCK FLUSH 100 UNIT/ML IV SOLN
INTRAVENOUS | Status: AC
  Filled 2017-08-08: qty 5

## 2017-08-08 MED ORDER — ACETAMINOPHEN 10 MG/ML IV SOLN: 1000.0000 mg | Freq: Once | INTRAVENOUS | Status: DC | PRN

## 2017-08-08 MED ORDER — FENTANYL CITRATE (PF) 100 MCG/2ML IJ SOLN
INTRAMUSCULAR | Status: AC
Start: 2017-08-08 — End: 2017-08-08
  Filled 2017-08-08: qty 2

## 2017-08-08 MED ORDER — CHLORHEXIDINE GLUCONATE CLOTH 2 % EX PADS: 6.0000 | MEDICATED_PAD | Freq: Once | CUTANEOUS | Status: DC

## 2017-08-08 MED ORDER — LIDOCAINE HCL (PF) 1 % IJ SOLN
INTRAMUSCULAR | Status: DC | PRN
  Administered 2017-08-08: 4 mL
  Administered 2017-08-08: 8 mL

## 2017-08-08 MED ORDER — LIDOCAINE HCL (PF) 1 % IJ SOLN
INTRAMUSCULAR | Status: AC
  Filled 2017-08-08: qty 30

## 2017-08-08 MED ORDER — PROPOFOL 500 MG/50ML IV EMUL
INTRAVENOUS | Status: DC | PRN
  Administered 2017-08-08: 35 ug/kg/min via INTRAVENOUS

## 2017-08-08 MED ORDER — MIDAZOLAM HCL 5 MG/5ML IJ SOLN
INTRAMUSCULAR | Status: DC | PRN
  Administered 2017-08-08 (×2): 1 mg via INTRAVENOUS

## 2017-08-08 MED ORDER — FENTANYL CITRATE (PF) 100 MCG/2ML IJ SOLN
INTRAMUSCULAR | Status: AC
  Filled 2017-08-08: qty 2

## 2017-08-08 MED ORDER — FENTANYL CITRATE (PF) 100 MCG/2ML IJ SOLN
INTRAMUSCULAR | Status: DC | PRN
  Administered 2017-08-08 (×2): 25 ug via INTRAVENOUS

## 2017-08-08 MED ORDER — LACTATED RINGERS IV SOLN
INTRAVENOUS | Status: DC
  Administered 2017-08-08: 09:00:00 via INTRAVENOUS

## 2017-08-08 MED ORDER — HEPARIN SOD (PORK) LOCK FLUSH 100 UNIT/ML IV SOLN
INTRAVENOUS | Status: DC | PRN
  Administered 2017-08-08: 500 [IU] via INTRAVENOUS

## 2017-08-08 SURGICAL SUPPLY — 39 items
ADH SKN CLS APL DERMABOND .7 (GAUZE/BANDAGES/DRESSINGS) ×2
BAG DECANTER FOR FLEXI CONT (MISCELLANEOUS) ×4 IMPLANT
BAG HAMPER (MISCELLANEOUS) ×4 IMPLANT
CHLORAPREP W/TINT 10.5 ML (MISCELLANEOUS) ×4 IMPLANT
CLOTH BEACON ORANGE TIMEOUT ST (SAFETY) ×4 IMPLANT
COVER LIGHT HANDLE STERIS (MISCELLANEOUS) ×8 IMPLANT
DECANTER SPIKE VIAL GLASS SM (MISCELLANEOUS) ×4 IMPLANT
DERMABOND ADVANCED (GAUZE/BANDAGES/DRESSINGS) ×2
DERMABOND ADVANCED .7 DNX12 (GAUZE/BANDAGES/DRESSINGS) ×2 IMPLANT
DRAPE C-ARM FOLDED MOBILE STRL (DRAPES) ×4 IMPLANT
DRAPE PROXIMA HALF (DRAPES) ×4 IMPLANT
DRSG TELFA 3X8 NADH (GAUZE/BANDAGES/DRESSINGS) ×4 IMPLANT
DRSG XEROFORM 1X8 (GAUZE/BANDAGES/DRESSINGS) ×2 IMPLANT
ELECT REM PT RETURN 9FT ADLT (ELECTROSURGICAL) ×4
ELECTRODE REM PT RTRN 9FT ADLT (ELECTROSURGICAL) ×2 IMPLANT
GAUZE IODOFORM PACK 1/2 7832 (GAUZE/BANDAGES/DRESSINGS) ×2 IMPLANT
GAUZE SPONGE 4X4 12PLY STRL (GAUZE/BANDAGES/DRESSINGS) ×2 IMPLANT
GLOVE BIOGEL M 7.0 STRL (GLOVE) ×2 IMPLANT
GLOVE BIOGEL PI IND STRL 7.0 (GLOVE) ×2 IMPLANT
GLOVE BIOGEL PI INDICATOR 7.0 (GLOVE) ×4
GLOVE SURG SS PI 7.5 STRL IVOR (GLOVE) ×4 IMPLANT
GOWN STRL REUS W/TWL LRG LVL3 (GOWN DISPOSABLE) ×8 IMPLANT
IV NS 500ML (IV SOLUTION) ×4
IV NS 500ML BAXH (IV SOLUTION) ×2 IMPLANT
KIT PORT POWER 8FR ISP MRI (Port) ×4 IMPLANT
KIT TURNOVER KIT A (KITS) ×4 IMPLANT
MANIFOLD NEPTUNE II (INSTRUMENTS) ×4 IMPLANT
NDL HYPO 25X1 1.5 SAFETY (NEEDLE) ×2 IMPLANT
NEEDLE HYPO 25X1 1.5 SAFETY (NEEDLE) ×4 IMPLANT
PACK MINOR (CUSTOM PROCEDURE TRAY) ×4 IMPLANT
PAD ARMBOARD 7.5X6 YLW CONV (MISCELLANEOUS) ×4 IMPLANT
PAD DRESSING TELFA 3X8 NADH (GAUZE/BANDAGES/DRESSINGS) IMPLANT
SET BASIN LINEN APH (SET/KITS/TRAYS/PACK) ×4 IMPLANT
SUT MNCRL AB 4-0 PS2 18 (SUTURE) ×4 IMPLANT
SUT VIC AB 3-0 SH 27 (SUTURE) ×4
SUT VIC AB 3-0 SH 27X BRD (SUTURE) ×2 IMPLANT
SYR 20CC LL (SYRINGE) ×4 IMPLANT
SYR CONTROL 10ML LL (SYRINGE) ×4 IMPLANT
TAPE CLOTH SURG 4X10 WHT LF (GAUZE/BANDAGES/DRESSINGS) ×2 IMPLANT

## 2017-08-08 NOTE — Interval H&P Note (Signed)
History and Physical Interval Note:  08/08/2017 9:39 AM  Jeff [name]  has presented today for surgery, with the diagnosis of dysfuctional port, prostate cancer  The various methods of treatment have been discussed with the patient and family. After consideration of risks, benefits and other options for treatment, the patient has consented to  Procedure(s): INSERTION PORT-A-CATH (Left) REMOVAL PORT-A-CATH (Right) as a surgical intervention .  The patient's history has been reviewed, patient examined, no change in status, stable for surgery.  I have reviewed the patient's chart and labs.  Questions were answered to the patient's satisfaction.     Aviva Signs

## 2017-08-08 NOTE — Anesthesia Preprocedure Evaluation (Signed)
Anesthesia Evaluation  Patient identified by MRN, date of birth, ID band Patient awake    Reviewed: Allergy & Precautions, H&P , NPO status , Patient's Chart, lab work & pertinent test results, reviewed documented beta blocker date and time   Airway Mallampati: II  TM Distance: >3 FB Neck ROM: full    Dental no notable dental hx. (+) Edentulous Upper, Edentulous Lower   Pulmonary neg pulmonary ROS, Current Smoker,    Pulmonary exam normal breath sounds clear to auscultation       Cardiovascular Exercise Tolerance: Good negative cardio ROS   Rhythm:Regular Rate:Normal     Neuro/Psych negative neurological ROS  negative psych ROS   GI/Hepatic negative GI ROS, Neg liver ROS,   Endo/Other  negative endocrine ROS  Renal/GU negative Renal ROS  negative genitourinary   Musculoskeletal  (+) Arthritis , Osteoarthritis,    Abdominal   Peds  Hematology negative hematology ROS (+)   Anesthesia Other Findings   Reproductive/Obstetrics negative OB ROS                             Anesthesia Physical Anesthesia Plan  ASA: II  Anesthesia Plan: MAC   Post-op Pain Management:    Induction: Intravenous  PONV Risk Score and Plan:   Airway Management Planned: Nasal Cannula  Additional Equipment:   Intra-op Plan:   Post-operative Plan:   Informed Consent: I have reviewed the patients History and Physical, chart, labs and discussed the procedure including the risks, benefits and alternatives for the proposed anesthesia with the patient or authorized representative who has indicated his/her understanding and acceptance.   Dental Advisory Given  Plan Discussed with: CRNA  Anesthesia Plan Comments:         Anesthesia Quick Evaluation

## 2017-08-08 NOTE — Discharge Instructions (Signed)
Implanted Port Insertion, Care After °This sheet gives you information about how to care for yourself after your procedure. Your health care provider may also give you more specific instructions. If you have problems or questions, contact your health care provider. °What can I expect after the procedure? °After your procedure, it is common to have: °· Discomfort at the port insertion site. °· Bruising on the skin over the port. This should improve over 3-4 days. ° °Follow these instructions at home: °Port care °· After your port is placed, you will get a manufacturer's information card. The card has information about your port. Keep this card with you at all times. °· Take care of the port as told by your health care provider. Ask your health care provider if you or a family member can get training for taking care of the port at home. A home health care nurse may also take care of the port. °· Make sure to remember what type of port you have. °Incision care °· Follow instructions from your health care provider about how to take care of your port insertion site. Make sure you: °? Wash your hands with soap and water before you change your bandage (dressing). If soap and water are not available, use hand sanitizer. °? Change your dressing as told by your health care provider. °? Leave stitches (sutures), skin glue, or adhesive strips in place. These skin closures may need to stay in place for 2 weeks or longer. If adhesive strip edges start to loosen and curl up, you may trim the loose edges. Do not remove adhesive strips completely unless your health care provider tells you to do that. °· Check your port insertion site every day for signs of infection. Check for: °? More redness, swelling, or pain. °? More fluid or blood. °? Warmth. °? Pus or a bad smell. °General instructions °· Do not take baths, swim, or use a hot tub until your health care provider approves. °· Do not lift anything that is heavier than 10 lb (4.5  kg) for a week, or as told by your health care provider. °· Ask your health care provider when it is okay to: °? Return to work or school. °? Resume usual physical activities or sports. °· Do not drive for 24 hours if you were given a medicine to help you relax (sedative). °· Take over-the-counter and prescription medicines only as told by your health care provider. °· Wear a medical alert bracelet in case of an emergency. This will tell any health care providers that you have a port. °· Keep all follow-up visits as told by your health care provider. This is important. °Contact a health care provider if: °· You cannot flush your port with saline as directed, or you cannot draw blood from the port. °· You have a fever or chills. °· You have more redness, swelling, or pain around your port insertion site. °· You have more fluid or blood coming from your port insertion site. °· Your port insertion site feels warm to the touch. °· You have pus or a bad smell coming from the port insertion site. °Get help right away if: °· You have chest pain or shortness of breath. °· You have bleeding from your port that you cannot control. °Summary °· Take care of the port as told by your health care provider. °· Change your dressing as told by your health care provider. °· Keep all follow-up visits as told by your health care provider. °  This information is not intended to replace advice given to you by your health care provider. Make sure you discuss any questions you have with your health care provider. Document Released: 01/31/2013 Document Revised: 03/03/2016 Document Reviewed: 03/03/2016 Elsevier Interactive Patient Education  2017 [employer]    PATIENT INSTRUCTIONS POST-ANESTHESIA  IMMEDIATELY FOLLOWING SURGERY:  Do not drive or operate machinery for the first twenty four hours after surgery.  Do not make any important decisions for twenty four hours after surgery or while taking narcotic pain medications or  sedatives.  If you develop intractable nausea and vomiting or a severe headache please notify your doctor immediately.  FOLLOW-UP:  Please make an appointment with your surgeon as instructed. You do not need to follow up with anesthesia unless specifically instructed to do so.  WOUND CARE INSTRUCTIONS (if applicable):  Keep a dry clean dressing on the anesthesia/puncture wound site if there is drainage.  Once the wound has quit draining you may leave it open to air.  Generally you should leave the bandage intact for twenty four hours unless there is drainage.  If the epidural site drains for more than 36-48 hours please call the anesthesia department.  QUESTIONS?:  Please feel free to call your physician or the hospital operator if you have any questions, and they will be happy to assist you.

## 2017-08-08 NOTE — Addendum Note (Signed)
Addendum  created 08/08/17 1147 by Benay Pike, MD   Order list changed, Order sets accessed

## 2017-08-08 NOTE — Anesthesia Postprocedure Evaluation (Signed)
Anesthesia Post Note  Patient: Jeff [name]  Procedure(s) Performed: INSERTION POWER PORT WITH ATTACHED CATHETER LEFT SUBCLAVIAN (PROCEDURE #1) (Left Chest) REMOVAL PORT-A-CATH RIGHT CHEST (PROCEDURE #2) (Right Chest)  Patient location during evaluation: PACU Anesthesia Type: MAC Level of consciousness: awake and alert Pain management: pain level controlled Vital Signs Assessment: post-procedure vital signs reviewed and stable Respiratory status: spontaneous breathing, nonlabored ventilation, respiratory function stable and patient connected to nasal cannula oxygen Cardiovascular status: stable and blood pressure returned to baseline Postop Assessment: no apparent nausea or vomiting Anesthetic complications: no     Last Vitals:  Vitals:   08/08/17 1000 08/08/17 1005  BP: 101/66   Pulse:    Resp: 17 16  Temp:    SpO2: 97% 98%    Last Pain:  Vitals:   08/08/17 0759  TempSrc: Oral  PainSc: 7                  Benay Pike

## 2017-08-08 NOTE — Op Note (Signed)
Patient:  Jeff [name]  DOB:  Mar 01, 1946  MRN:  [id number]   Preop Diagnosis: Metastatic prostate cancer, dysfunctional Port-A-Cath  Postop Diagnosis: Same  Procedure: Port-A-Cath insertion, Port-A-Cath removal  Surgeon: Aviva Signs, MD  Anes: MAC  Indications: Patient is a 72 year old white male who suffers from metastatic prostate disease who had a Port-A-Cath placed in the remote past on the right side and a portion of the tunneled catheter is now exposed.  He now presents for Port-A-Cath removal and replacement of the Port-A-Cath.  The risks and benefits of the procedure including bleeding, infection, and pneumothorax were fully explained to the patient, who gave informed consent.  Procedure note: The patient was placed in the Trendelenburg position after the left upper chest was prepped and draped using the usual sterile technique with DuraPrep.  Surgical site confirmation was performed.  We also prepped the right side with Betadine but excluded from the operative field.  1% Xylocaine was used for local anesthesia.  An incision was made below the left clavicle.  A subcutaneous pocket was formed.  The needle was advanced into the left subclavian vein using the Seldinger technique without difficulty.  A guidewire was then advanced into the right atrium under fluoroscopic guidance.  An introducer and peel-away sheath were placed over the guidewire.  The catheter was then inserted through the peel-away sheath and the peel-away sheath was removed.  The catheter was then attached to the port and the port placed in subcutaneous pocket.  Adequate positioning was confirmed by fluoroscopy.  Good backflow of blood was noted on aspiration of the port.  The port was flushed with heparin flush.  The subcutaneous layer was reapproximated using a 3-0 Vicryl interrupted suture.  The skin was closed using a 4-0 Monocryl subcuticular suture.  Dermabond was applied.  Next, the right-sided Port-A-Cath was  removed.  The midportion of the catheter over the right clavicle was exposed.  In addition, there was necrotic tissue over the more inferior port.  An incision was made after 1% Xylocaine was used for local anesthesia.  The port along with the catheter was removed in total without difficulty.  The catheter tip was cut and sent to culture.  The superficial skin where the catheter was exposed was covered with Xeroform.  1/2 inch iodoform Nu Gauze packing was then placed in the pocket of the reservoir.  A dry sterile dressing was then applied.  All tape needle counts were correct at the end of the procedures.  The patient was transferred to PACU in stable condition.  A chest x-ray will be performed at that time.  Complications: None  EBL: Minimal  Specimen: Catheter tip for aerobic culture

## 2017-08-08 NOTE — Anesthesia Postprocedure Evaluation (Signed)
Anesthesia Post Note  Patient: Jeff [name]  Procedure(s) Performed: INSERTION POWER PORT WITH ATTACHED CATHETER LEFT SUBCLAVIAN (PROCEDURE #1) (Left Chest) REMOVAL PORT-A-CATH RIGHT CHEST (PROCEDURE #2) (Right Chest)  Patient location during evaluation: PACU Anesthesia Type: MAC Level of consciousness: awake and alert Pain management: pain level controlled Vital Signs Assessment: post-procedure vital signs reviewed and stable Respiratory status: spontaneous breathing, nonlabored ventilation and respiratory function stable Cardiovascular status: blood pressure returned to baseline Postop Assessment: no apparent nausea or vomiting Anesthetic complications: no     Last Vitals:  Vitals:   08/08/17 1000 08/08/17 1005  BP: 101/66   Pulse:    Resp: 17 16  Temp:    SpO2: 97% 98%    Last Pain:  Vitals:   08/08/17 0759  TempSrc: Oral  PainSc: 7                  Donnarae Rae J

## 2017-08-08 NOTE — Transfer of Care (Signed)
Immediate Anesthesia Transfer of Care Note  Patient: Pellegrino Kennard  Procedure(s) Performed: INSERTION POWER PORT WITH ATTACHED CATHETER LEFT SUBCLAVIAN (PROCEDURE #1) (Left Chest) REMOVAL PORT-A-CATH RIGHT CHEST (PROCEDURE #2) (Right Chest)  Patient Location: PACU  Anesthesia Type:MAC  Level of Consciousness: awake and patient cooperative  Airway & Oxygen Therapy: Patient Spontanous Breathing and Patient connected to nasal cannula oxygen  Post-op Assessment: Report given to RN, Post -op Vital signs reviewed and stable and Patient moving all extremities  Post vital signs: Reviewed and stable  Last Vitals:  Vitals Value Taken Time  BP    Temp    Pulse    Resp    SpO2      Last Pain:  Vitals:   08/08/17 0759  TempSrc: Oral  PainSc: 7       Patients Stated Pain Goal: 6 (23/34/35 6861)  Complications: No apparent anesthesia complications

## 2017-08-09 ENCOUNTER — Inpatient Hospital Stay (HOSPITAL_COMMUNITY): Payer: Medicare Other

## 2017-08-09 ENCOUNTER — Encounter (HOSPITAL_COMMUNITY): Payer: Self-pay

## 2017-08-09 VITALS — BP 108/61 | HR 58 | Temp 97.7°F | Resp 18 | Wt 170.2 lb

## 2017-08-09 DIAGNOSIS — Z5111 Encounter for antineoplastic chemotherapy: Secondary | ICD-10-CM | POA: Diagnosis not present

## 2017-08-09 DIAGNOSIS — C61 Malignant neoplasm of prostate: Secondary | ICD-10-CM

## 2017-08-09 LAB — COMPREHENSIVE METABOLIC PANEL
ALK PHOS: 52 U/L (ref 38–126)
ALT: 12 U/L — ABNORMAL LOW (ref 17–63)
ANION GAP: 11 (ref 5–15)
AST: 18 U/L (ref 15–41)
Albumin: 3.2 g/dL — ABNORMAL LOW (ref 3.5–5.0)
BUN: 24 mg/dL — AB (ref 6–20)
CALCIUM: 8.6 mg/dL — AB (ref 8.9–10.3)
CO2: 23 mmol/L (ref 22–32)
Chloride: 101 mmol/L (ref 101–111)
Creatinine, Ser: 1.98 mg/dL — ABNORMAL HIGH (ref 0.61–1.24)
GFR calc Af Amer: 38 mL/min — ABNORMAL LOW (ref >60)
GFR, EST NON AFRICAN AMERICAN: 32 mL/min — AB (ref >60)
Glucose, Bld: 92 mg/dL (ref 65–99)
POTASSIUM: 3.9 mmol/L (ref 3.5–5.1)
Sodium: 135 mmol/L (ref 135–145)
TOTAL PROTEIN: 7.6 g/dL (ref 6.5–8.1)
Total Bilirubin: 0.2 mg/dL — ABNORMAL LOW (ref 0.3–1.2)

## 2017-08-09 LAB — CBC WITH DIFFERENTIAL/PLATELET
Basophils Absolute: 0 10*3/uL (ref 0.0–0.1)
Basophils Relative: 0 %
Eosinophils Absolute: 0.1 10*3/uL (ref 0.0–0.7)
Eosinophils Relative: 2 %
HCT: 31.6 % — ABNORMAL LOW (ref 39.0–52.0)
Hemoglobin: 10.2 g/dL — ABNORMAL LOW (ref 13.0–17.0)
LYMPHS ABS: 1.8 10*3/uL (ref 0.7–4.0)
LYMPHS PCT: 31 %
MCH: 29.1 pg (ref 26.0–34.0)
MCHC: 32.3 g/dL (ref 30.0–36.0)
MCV: 90 fL (ref 78.0–100.0)
MONO ABS: 0.5 10*3/uL (ref 0.1–1.0)
MONOS PCT: 8 %
NEUTROS ABS: 3.4 10*3/uL (ref 1.7–7.7)
Neutrophils Relative %: 59 %
Platelets: 351 10*3/uL (ref 150–400)
RBC: 3.51 MIL/uL — ABNORMAL LOW (ref 4.22–5.81)
RDW: 15.9 % — AB (ref 11.5–15.5)
WBC: 5.8 10*3/uL (ref 4.0–10.5)

## 2017-08-09 LAB — PSA: PROSTATIC SPECIFIC ANTIGEN: 0.97 ng/mL (ref 0.00–4.00)

## 2017-08-09 MED ORDER — DEXTROSE 5 % IV SOLN
20.0000 mg/m2 | Freq: Once | INTRAVENOUS | Status: AC
  Administered 2017-08-09: 41 mg via INTRAVENOUS
  Filled 2017-08-09: qty 4.1

## 2017-08-09 MED ORDER — SODIUM CHLORIDE 0.9% FLUSH
10.0000 mL | INTRAVENOUS | Status: DC | PRN
Start: 2017-08-09 — End: 2017-08-09
  Administered 2017-08-09: 10 mL
  Filled 2017-08-09: qty 10

## 2017-08-09 MED ORDER — FAMOTIDINE IN NACL 20-0.9 MG/50ML-% IV SOLN
20.0000 mg | Freq: Once | INTRAVENOUS | Status: AC
  Administered 2017-08-09: 20 mg via INTRAVENOUS

## 2017-08-09 MED ORDER — DIPHENHYDRAMINE HCL 50 MG/ML IJ SOLN
25.0000 mg | Freq: Once | INTRAMUSCULAR | Status: AC
  Administered 2017-08-09: 25 mg via INTRAVENOUS
  Filled 2017-08-09: qty 1

## 2017-08-09 MED ORDER — FAMOTIDINE IN NACL 20-0.9 MG/50ML-% IV SOLN
INTRAVENOUS | Status: AC
  Filled 2017-08-09: qty 50

## 2017-08-09 MED ORDER — HEPARIN SOD (PORK) LOCK FLUSH 100 UNIT/ML IV SOLN
500.0000 [IU] | Freq: Once | INTRAVENOUS | Status: AC | PRN
  Administered 2017-08-09: 500 [IU]
  Filled 2017-08-09: qty 5

## 2017-08-09 MED ORDER — SODIUM CHLORIDE 0.9 % IV SOLN
Freq: Once | INTRAVENOUS | Status: AC
  Administered 2017-08-09: 09:00:00 via INTRAVENOUS

## 2017-08-09 MED ORDER — PEGFILGRASTIM 6 MG/0.6ML ~~LOC~~ PSKT
6.0000 mg | PREFILLED_SYRINGE | Freq: Once | SUBCUTANEOUS | Status: AC
  Administered 2017-08-09: 6 mg via SUBCUTANEOUS
  Filled 2017-08-09: qty 0.6

## 2017-08-09 MED ORDER — DEXAMETHASONE SODIUM PHOSPHATE 10 MG/ML IJ SOLN
INTRAMUSCULAR | Status: AC
  Filled 2017-08-09: qty 1

## 2017-08-09 MED ORDER — DEXAMETHASONE SODIUM PHOSPHATE 10 MG/ML IJ SOLN
10.0000 mg | Freq: Once | INTRAMUSCULAR | Status: AC
  Administered 2017-08-09: 10 mg via INTRAVENOUS

## 2017-08-09 NOTE — Patient Instructions (Signed)
[facility] Discharge Instructions for Patients Receiving Chemotherapy   Beginning January 23rd 2017 lab work for the [facility] will be done in the  Main lab at [facility] on 1st floor. If you have a lab appointment with the [facility] please come in thru the  Main Entrance and check in at the main information desk   Today you received the following chemotherapy agents Jevtana as well as Neulasta on-pro. Follow-up as scheduled. Call clinic for any questions or concerns  To help prevent nausea and vomiting after your treatment, we encourage you to take your nausea medication   If you develop nausea and vomiting, or diarrhea that is not controlled by your medication, call the clinic.  The clinic phone number is [phone]. Office hours are Monday-Friday 8:30am-5:00pm.  BELOW ARE SYMPTOMS THAT SHOULD BE REPORTED IMMEDIATELY:  *FEVER GREATER THAN 101.0 F  *CHILLS WITH OR WITHOUT FEVER  NAUSEA AND VOMITING THAT IS NOT CONTROLLED WITH YOUR NAUSEA MEDICATION  *UNUSUAL SHORTNESS OF BREATH  *UNUSUAL BRUISING OR BLEEDING  TENDERNESS IN MOUTH AND THROAT WITH OR WITHOUT PRESENCE OF ULCERS  *URINARY PROBLEMS  *BOWEL PROBLEMS  UNUSUAL RASH Items with * indicate a potential emergency and should be followed up as soon as possible. If you have an emergency after office hours please contact your primary care physician or go to the nearest emergency department.  Please call the clinic during office hours if you have any questions or concerns.   You may also contact the Patient Navigator at [phone] should you have any questions or need assistance in obtaining follow up care.      Resources For Cancer Patients and their Caregivers ? American Cancer Society: Can assist with transportation, wigs, general needs, runs Look Good Feel Better.        269-806-6718 ? Cancer Care: Provides financial assistance, online support groups, medication/co-pay  assistance.  1-800-813-HOPE 517 246 3105) ? [facility] Assists [place] cancer patients and their families through emotional , educational and financial support.  702 520 5290 ? [place] DSS Where to apply for food stamps, Medicaid and utility assistance. 6154101745 ? [facility]: Transportation to medical appointments. 8656411084 ? Social Security Administration: May apply for disability if have a Stage IV cancer. 574-743-0951 [phone])716-4709 ? [employer], Disability and Transit Services: Assists with nutrition, care and transit needs. ([phone]

## 2017-08-09 NOTE — Progress Notes (Signed)
0900 Labs reviewed with Dr. Delton Coombes and pt approved for chemo tx today per MD                             Jeff [name] tolerated chemo tx with Neulasta on-pro well without complaints or incident. Neulasta on-pro applied to pt's left arm with green indicator light flashing. VSS upon discharge. Pt discharged self ambulatory in satisfactory condition

## 2017-08-10 ENCOUNTER — Encounter (HOSPITAL_COMMUNITY): Payer: Self-pay | Admitting: General Surgery

## 2017-08-11 ENCOUNTER — Encounter: Payer: Self-pay | Admitting: General Surgery

## 2017-08-11 ENCOUNTER — Ambulatory Visit (INDEPENDENT_AMBULATORY_CARE_PROVIDER_SITE_OTHER): Payer: Self-pay | Admitting: General Surgery

## 2017-08-11 VITALS — BP 109/73 | HR 81 | Temp 98.0°F | Ht 69.0 in | Wt 167.0 lb

## 2017-08-11 DIAGNOSIS — Z09 Encounter for follow-up examination after completed treatment for conditions other than malignant neoplasm: Secondary | ICD-10-CM

## 2017-08-11 NOTE — Patient Instructions (Signed)
Clean wound with q-tip and peroxide daily

## 2017-08-11 NOTE — Progress Notes (Signed)
Subjective:     Jeff [name]  Status post removal of Port-A-Cath and placement of a new one.  Patient doing well.  Catheter tip shows no growth at this time. Objective:    BP 109/73   Pulse 81   Temp 98 F (36.7 C)   Ht 5\' 9"  (1.753 m)   Wt 167 lb (75.8 kg)   BMI 24.66 kg/m   General:  alert, cooperative and no distress  Packing removed from old Port-A-Cath site.  Healing well with granulation tissue present.  New Port-A-Cath incision site well-healed.     Assessment:    Doing well postoperatively.    Plan:   Clean wound with Q-tip and peroxide daily.  Follow-up here in 1 week for wound check.

## 2017-08-12 LAB — CATH TIP CULTURE: Culture: 30000 — AB

## 2017-08-18 ENCOUNTER — Ambulatory Visit: Payer: Medicare Other | Admitting: General Surgery

## 2017-08-22 NOTE — Progress Notes (Signed)
Diagnosis No diagnosis found.  Staging Cancer Staging No matching staging information was found for the patient.  Assessment and Plan:  1.  Prostate cancer metastatic to multiple sites.  72 y.o. 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 Lebron Conners.  Currently undergoing palliative systemic chemotherapy with cabazitaxel and supportive therapy with denosumab.  Labs done 08/09/2017 show a PSA of 0.97.  Creatinine 1.98.  Bone scan done 07/25/2017 showed  IMPRESSION: Overall, there is again noted to be multiple foci of increased activity in the skeleton compatible with metastatic disease. Some areas are stable while other areas in the axial skeleton have increased suggesting worsening disease.  He began Carbazitaxel May 03, 2017.  Will hold treatment today as he is scheduled for port replacement.  He will be set up for CAT scans in August 2019 for ongoing follow-up.  PSA remains stable.  2.  Bone mets.  Bone scan done 07/25/2017 showed  IMPRESSION: Overall, there is again noted to be multiple foci of increased activity in the skeleton compatible with metastatic disease. Some areas are stable while other areas in the axial skeleton have increased suggesting worsening disease.  PSA remains stable at 0.97.  Cabazitaxel held today until port replacement done.   Pt on Xgeva every 6 weeks per Dr. Lebron Conners.    3.  PAC concerns.    Pt has been seen by surgery.  He tested + for Cocaine so removal as cancelled.  Pt is scheduled for port replacement by Dr. Arnoldo Morale.  Patient is afebrile, white blood cell count 8.7.  4.  + drug screen.  Pt tested + for cocaine.  Patient will be seen by social work.  Current Status:  Pt is here today for follow-up prior to Cabazitaxel.  He is here to go over bone scan.      Malignant neoplasm of prostate ([place])   01/26/2014 Tumor Marker    PSA > 5000      01/28/2014 Initial Biopsy    Metastatic adenocarcinoma of  prostate      02/05/2014 Surgery    Bilateral orchiectomy by Dr. Junious Silk      02/26/2014 Tumor Marker    PSA = 399.5      03/14/2014 Tumor Marker    PSA= 89.51      03/14/2014 - 06/26/2014 Chemotherapy    Docetaxel 75 mg/kg every 21 days x 6 cycles      04/24/2014 Tumor Marker    PSA- 19.89      07/23/2014 Procedure    Nephrostomy tube removed by IR, Dr. Geroge Baseman      07/24/2014 Tumor Marker    PSA= 6.15      10/16/2014 Tumor Marker    PSA: 3.54       01/08/2015 Tumor Marker    PSA: 2.13       01/17/2015 Imaging    CT CAP-  Massive pelvic and retroperitoneal lymphadenopathy noted on the prior study has nearly completely resolved, now with only a small amount of residual amorphous soft tissue predominantly around the the infrarenal abdominal aorta.      01/17/2015 Imaging    Bone scan- Widespread osseous metastatic disease with multiple foci of increased activity throughout the skeleton, corresponding with the findings on the prior CT from October, 2015.      04/11/2015 Tumor Marker    PSA: 1.87       07/21/2015 Imaging    Bone scan- Bony metastatic disease again noted at multiple sites, stable  from prior study. No progression of bony metastatic disease is demonstrable on this study.      07/25/2015 Imaging    CT CAP- Stable matted soft tissue density in the retroperitoneum and extraperitoneal pelvis consistent with treated disease. No recurrent lymphadenopathy.  No pulmonary metastatic disease. Diffuse stable sclerotic metastatic bone disease.      01/14/2016 Imaging    Bone scan-  Multiple sites of abnormal increased tracer localization involving BILATERAL ribs, T11, T12, questionably RIGHT scapula and LEFT iliac bone suspicious for osseous metastases.      01/23/2016 Imaging    CT CAP- 1. Similar widespread osseous metastasis. 2. Similar soft tissue thickening within the retroperitoneum of the abdomen. Improved left pelvic side wall soft tissue  thickening. These are consistent with sites of treated disease. No well-defined adenopathy. 3. No new sites of disease.      05/03/2016 -  Chemotherapy    Jevtana every 21 days       09/06/2016 Imaging    Restaging CT C/A/P: IMPRESSION: 1. Similar appearance of widespread sclerotic bone metastases. 2. No change in soft tissue thickening within the retroperitoneum and left pelvic sidewall. No well defined adenopathy identified. 3. No new sites of disease 4. Aortic Atherosclerosis (ICD10-I70.0). Coronary artery calcifications noted. 5. 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 6. Stable 9 mm pancreatic cystic lesion. Favor pseudocyst. Indolent neoplasm may look similar. Attention on follow-up imaging.      09/06/2016 Imaging    Bone Scan: IMPRESSION: In this patient with known diffuse sclerotic metastatic disease, bone scan does not reveal progression of radiotracer uptake. Several of the areas of previously demonstrated radiotracer uptake appear less prominent.  Change in appearance of radiotracer uptake involving the mandible now more notable on the right and previously more notable on the left may reflect result of dental disease rather than metastatic disease.       04/08/2017 Tumor Marker    PSA 0.89        Problem List Patient Active Problem List   Diagnosis Date Noted  . 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 B12 deficiency [E53.8] 04/01/2016  . Malnutrition of moderate degree ([place]) [E44.0] 05/24/2014  . Bacteremia [R78.81] 05/24/2014  . Pyrexia [R50.9]   . Sepsis ([place]) [A41.9] 05/23/2014  . UTI (lower urinary tract infection) [N39.0] 05/23/2014  . Health care maintenance [Z00.00] 02/06/2014  . Homelessness [Z59.0] 01/31/2014  . Malignant neoplasm of prostate ([place]) [C61] 01/30/2014  . Bone lesion [M89.9] 01/26/2014  . Acute renal failure ([place]) [N17.9] 01/26/2014  . Bilateral hydronephrosis  [N13.30] 01/26/2014  . Anemia of chronic disease [D63.8] 01/26/2014  . History of cocaine abuse [Z87.898] 01/26/2014    Past Medical History Past Medical History:  Diagnosis Date  . Acute renal failure ([place]) 01/26/2014  . Alcohol abuse   . Anemia of chronic disease 01/26/2014  . Bilateral hydronephrosis 01/26/2014  . History of cocaine abuse 01/26/2014  . Homelessness 01/31/2014  . Prostate cancer metastatic to multiple sites ([place]) 01/30/2014  . Sepsis ([place])   . UTI (lower urinary tract infection)     Past Surgical History Past Surgical History:  Procedure Laterality Date  . ORCHIECTOMY Bilateral 02/05/2014   Procedure: BILATERAL ORCHIECTOMY;  Surgeon: Festus Aloe, MD;  Location: [facility];  Service: Urology;  Laterality: Bilateral;  . PERCUTANEOUS NEPHROSTOMY Bilateral    IR Dr. Junious Silk changed on 04/22/2014  . PORT-A-CATH REMOVAL Right 08/08/2017   Procedure: REMOVAL PORT-A-CATH RIGHT CHEST (PROCEDURE #2);  Surgeon:  Aviva Signs, MD;  Location: [facility];  Service: General;  Laterality: Right;  . PORTACATH PLACEMENT Right 03/12/14  . PORTACATH PLACEMENT Left 08/08/2017   Procedure: INSERTION POWER PORT WITH ATTACHED CATHETER LEFT SUBCLAVIAN (PROCEDURE #1);  Surgeon: Aviva Signs, MD;  Location: [facility];  Service: General;  Laterality: Left;    Family History Family History  Problem Relation Age of Onset  . Cancer Brother 49     Social History  reports that he has been smoking cigarettes.  He has a 13.75 pack-year smoking history. He has never used smokeless tobacco. He reports that he drinks about 0.6 oz of alcohol per week. He reports that he has current or past drug history. Drug: Cocaine.  Medications  Current Outpatient Medications:  .  HYDROcodone-acetaminophen (NORCO) 5-325 MG tablet, Take 1 tablet by mouth every 6 (six) hours as needed for moderate pain., Disp: 25 tablet, Rfl: 0 .  lidocaine-prilocaine (EMLA) cream, Apply a quarter size amount to port site 1 hour prior  to chemo. Do not rub in. Cover with plastic wrap. (Patient not taking: Reported on 07/26/2017), Disp: 30 g, Rfl: 3 .  metoCLOPramide (REGLAN) 5 MG tablet, The day after chemo take 1 tab four times a day x 2 days. Then may take 1 tab four times a day if needed for nausea/vomiting. (Patient not taking: Reported on 07/26/2017), Disp: 60 tablet, Rfl: 2 .  ondansetron (ZOFRAN) 8 MG tablet, Take 1 tablet (8 mg total) by mouth every 8 (eight) hours as needed for nausea or vomiting. (Patient not taking: Reported on 07/26/2017), Disp: 30 tablet, Rfl: 2 .  prochlorperazine (COMPAZINE) 10 MG tablet, The day after chemo take 1 tab four times a day x 2 days. Then may take 1 tab four times a day if needed for nausea/vomiting. (Patient not taking: Reported on 07/26/2017), Disp: 60 tablet, Rfl: 2 No current facility-administered medications for this visit.   Facility-Administered Medications Ordered in Other Visits:  .  cyanocobalamin ((VITAMIN B-12)) injection 1,000 mcg, 1,000 mcg, Intramuscular, Once, Kefalas, Thomas S, PA-C .  denosumab (XGEVA) injection 120 mg, 120 mg, Subcutaneous, Once, Kefalas, Thomas S, PA-C  Allergies Patient has no known allergies.  Review of Systems Review of Systems - Oncology ROS as per HPI otherwise 12 point ROS is negative.   Physical Exam  Vitals Wt Readings from Last 3 Encounters:  08/11/17 167 lb (75.8 kg)  08/09/17 170 lb 3.2 oz (77.2 kg)  07/26/17 161 lb (73 kg)   Temp Readings from Last 3 Encounters:  08/11/17 98 F (36.7 C)  08/09/17 97.7 F (36.5 C) (Oral)  08/08/17 98.2 F (36.8 C)   BP Readings from Last 3 Encounters:  08/11/17 109/73  08/09/17 108/61  08/08/17 126/78   Pulse Readings from Last 3 Encounters:  08/11/17 81  08/09/17 (!) 58  08/08/17 (!) 58   Constitutional: Well-developed, well-nourished, and in no distress.   HENT: Head: Normocephalic and atraumatic.  Mouth/Throat: No oropharyngeal exudate. Mucosa moist. Eyes: Pupils are equal, round,  and reactive to light. Conjunctivae are normal. No scleral icterus.  Neck: Normal range of motion. Neck supple. No JVD present.  Cardiovascular: Normal rate, regular rhythm and normal heart sounds.  Exam reveals no gallop and no friction rub.   No murmur heard. Pulmonary/Chest: Effort normal and breath sounds normal. No respiratory distress. No wheezes.No rales.  Abdominal: Soft. Bowel sounds are normal. No distension. There is no tenderness. There is no guarding.  Musculoskeletal: No edema or tenderness.  Lymphadenopathy:  No cervical, axillary or supraclavicular adenopathy.  Neurological: Alert and oriented to person, place, and time. No cranial nerve deficit.  Skin: Skin is warm and dry. No rash noted. No erythema. No pallor.   Port site inflamed. Psychiatric: Affect and judgment normal.   Labs Appointment on 07/21/2017  Component Date Value Ref Range Status  . WBC 07/21/2017 8.7  4.0 - 10.5 K/uL Final  . RBC 07/21/2017 3.69* 4.22 - 5.81 MIL/uL Final  . Hemoglobin 07/21/2017 10.7* 13.0 - 17.0 g/dL Final  . HCT 07/21/2017 33.4* 39.0 - 52.0 % Final  . MCV 07/21/2017 90.5  78.0 - 100.0 fL Final  . MCH 07/21/2017 29.0  26.0 - 34.0 pg Final  . MCHC 07/21/2017 32.0  30.0 - 36.0 g/dL Final  . RDW 07/21/2017 15.4  11.5 - 15.5 % Final  . Platelets 07/21/2017 558* 150 - 400 K/uL Final  . Neutrophils Relative % 07/21/2017 66  % Final  . Neutro Abs 07/21/2017 5.8  1.7 - 7.7 K/uL Final  . Lymphocytes Relative 07/21/2017 15  % Final  . Lymphs Abs 07/21/2017 1.3  0.7 - 4.0 K/uL Final  . Monocytes Relative 07/21/2017 19  % Final  . Monocytes Absolute 07/21/2017 1.7* 0.1 - 1.0 K/uL Final  . Eosinophils Relative 07/21/2017 0  % Final  . Eosinophils Absolute 07/21/2017 0.0  0.0 - 0.7 K/uL Final  . Basophils Relative 07/21/2017 0  % Final  . Basophils Absolute 07/21/2017 0.0  0.0 - 0.1 K/uL Final   Performed at [facility], [address], [place], [place] [address]  . Sodium 07/21/2017 133* 135 -  145 mmol/L Final  . Potassium 07/21/2017 5.0  3.5 - 5.1 mmol/L Final  . Chloride 07/21/2017 99* 101 - 111 mmol/L Final  . CO2 07/21/2017 21* 22 - 32 mmol/L Final  . Glucose, Bld 07/21/2017 96  65 - 99 mg/dL Final  . BUN 07/21/2017 23* 6 - 20 mg/dL Final  . Creatinine, Ser 07/21/2017 1.83* 0.61 - 1.24 mg/dL Final  . Calcium 07/21/2017 8.9  8.9 - 10.3 mg/dL Final  . Total Protein 07/21/2017 9.0* 6.5 - 8.1 g/dL Final  . Albumin 07/21/2017 3.4* 3.5 - 5.0 g/dL Final  . AST 07/21/2017 24  15 - 41 U/L Final  . ALT 07/21/2017 17  17 - 63 U/L Final  . Alkaline Phosphatase 07/21/2017 60  38 - 126 U/L Final  . Total Bilirubin 07/21/2017 0.8  0.3 - 1.2 mg/dL Final  . GFR calc non Af Amer 07/21/2017 36* >60 mL/min Final  . GFR calc Af Amer 07/21/2017 41* >60 mL/min Final   Comment: (NOTE) The eGFR has been calculated using the CKD EPI equation. This calculation has not been validated in all clinical situations. eGFR's persistently <60 mL/min signify possible Chronic Kidney Disease.   Georgiann Hahn gap 07/21/2017 13  5 - 15 Final   Performed at [facility], [address], [place], [place] [address]  . Prostatic Specific Antigen 07/21/2017 0.96  0.00 - 4.00 ng/mL Final   Comment: (NOTE) While PSA levels of <=4.0 ng/ml are reported as reference range, some men with levels below 4.0 ng/ml can have prostate cancer and many men with PSA above 4.0 ng/ml do not have prostate cancer.  Other tests such as free PSA, age specific reference ranges, PSA velocity and PSA doubling time may be helpful especially in men less than 51 years old. Performed at [facility], [place] [address], [place], [place] [address]  Pathology No orders of the defined types were placed in this encounter.      Zoila Shutter MD

## 2017-08-30 ENCOUNTER — Inpatient Hospital Stay (HOSPITAL_COMMUNITY): Payer: Medicare Other

## 2017-08-30 ENCOUNTER — Other Ambulatory Visit: Payer: Self-pay

## 2017-08-30 ENCOUNTER — Encounter (HOSPITAL_COMMUNITY): Payer: Self-pay | Admitting: Internal Medicine

## 2017-08-30 ENCOUNTER — Inpatient Hospital Stay (HOSPITAL_COMMUNITY): Payer: Medicare Other | Attending: Oncology | Admitting: Internal Medicine

## 2017-08-30 VITALS — BP 110/79 | HR 78 | Temp 97.7°F | Resp 16 | Wt 164.2 lb

## 2017-08-30 DIAGNOSIS — F1410 Cocaine abuse, uncomplicated: Secondary | ICD-10-CM | POA: Diagnosis not present

## 2017-08-30 DIAGNOSIS — R590 Localized enlarged lymph nodes: Secondary | ICD-10-CM | POA: Insufficient documentation

## 2017-08-30 DIAGNOSIS — Z5111 Encounter for antineoplastic chemotherapy: Secondary | ICD-10-CM | POA: Diagnosis not present

## 2017-08-30 DIAGNOSIS — Z5189 Encounter for other specified aftercare: Secondary | ICD-10-CM | POA: Insufficient documentation

## 2017-08-30 DIAGNOSIS — M545 Low back pain: Secondary | ICD-10-CM | POA: Diagnosis not present

## 2017-08-30 DIAGNOSIS — C61 Malignant neoplasm of prostate: Secondary | ICD-10-CM

## 2017-08-30 DIAGNOSIS — C7951 Secondary malignant neoplasm of bone: Secondary | ICD-10-CM | POA: Diagnosis present

## 2017-08-30 LAB — COMPREHENSIVE METABOLIC PANEL
ALK PHOS: 53 U/L (ref 38–126)
ALT: 13 U/L — AB (ref 17–63)
AST: 23 U/L (ref 15–41)
Albumin: 3.7 g/dL (ref 3.5–5.0)
Anion gap: 6 (ref 5–15)
BILIRUBIN TOTAL: 0.5 mg/dL (ref 0.3–1.2)
BUN: 19 mg/dL (ref 6–20)
CALCIUM: 9.3 mg/dL (ref 8.9–10.3)
CO2: 25 mmol/L (ref 22–32)
CREATININE: 1.56 mg/dL — AB (ref 0.61–1.24)
Chloride: 105 mmol/L (ref 101–111)
GFR, EST AFRICAN AMERICAN: 50 mL/min — AB (ref >60)
GFR, EST NON AFRICAN AMERICAN: 43 mL/min — AB (ref >60)
Glucose, Bld: 91 mg/dL (ref 65–99)
Potassium: 4.4 mmol/L (ref 3.5–5.1)
Sodium: 136 mmol/L (ref 135–145)
Total Protein: 7.6 g/dL (ref 6.5–8.1)

## 2017-08-30 LAB — CBC WITH DIFFERENTIAL/PLATELET
BASOS ABS: 0 10*3/uL (ref 0.0–0.1)
BASOS PCT: 0 %
EOS ABS: 0.1 10*3/uL (ref 0.0–0.7)
Eosinophils Relative: 1 %
HEMATOCRIT: 31.3 % — AB (ref 39.0–52.0)
HEMOGLOBIN: 10 g/dL — AB (ref 13.0–17.0)
Lymphocytes Relative: 31 %
Lymphs Abs: 1.5 10*3/uL (ref 0.7–4.0)
MCH: 28.4 pg (ref 26.0–34.0)
MCHC: 31.9 g/dL (ref 30.0–36.0)
MCV: 88.9 fL (ref 78.0–100.0)
Monocytes Absolute: 0.6 10*3/uL (ref 0.1–1.0)
Monocytes Relative: 12 %
NEUTROS ABS: 2.7 10*3/uL (ref 1.7–7.7)
NEUTROS PCT: 56 %
Platelets: 280 10*3/uL (ref 150–400)
RBC: 3.52 MIL/uL — AB (ref 4.22–5.81)
RDW: 17.3 % — ABNORMAL HIGH (ref 11.5–15.5)
WBC: 4.9 10*3/uL (ref 4.0–10.5)

## 2017-08-30 LAB — LACTATE DEHYDROGENASE: LDH: 183 U/L (ref 98–192)

## 2017-08-30 LAB — PSA: Prostatic Specific Antigen: 0.83 ng/mL (ref 0.00–4.00)

## 2017-08-30 MED ORDER — DEXAMETHASONE SODIUM PHOSPHATE 10 MG/ML IJ SOLN
INTRAMUSCULAR | Status: AC
  Filled 2017-08-30: qty 1

## 2017-08-30 MED ORDER — HEPARIN SOD (PORK) LOCK FLUSH 100 UNIT/ML IV SOLN
500.0000 [IU] | Freq: Once | INTRAVENOUS | Status: AC | PRN
  Administered 2017-08-30: 500 [IU]
  Filled 2017-08-30: qty 5

## 2017-08-30 MED ORDER — DEXTROSE 5 % IV SOLN
20.0000 mg/m2 | Freq: Once | INTRAVENOUS | Status: AC
  Administered 2017-08-30: 41 mg via INTRAVENOUS
  Filled 2017-08-30: qty 4.1

## 2017-08-30 MED ORDER — FAMOTIDINE IN NACL 20-0.9 MG/50ML-% IV SOLN
INTRAVENOUS | Status: AC
  Filled 2017-08-30: qty 50

## 2017-08-30 MED ORDER — PEGFILGRASTIM 6 MG/0.6ML ~~LOC~~ PSKT
6.0000 mg | PREFILLED_SYRINGE | Freq: Once | SUBCUTANEOUS | Status: AC
  Administered 2017-08-30: 6 mg via SUBCUTANEOUS
  Filled 2017-08-30: qty 0.6

## 2017-08-30 MED ORDER — PALONOSETRON HCL INJECTION 0.25 MG/5ML
INTRAVENOUS | Status: AC
  Filled 2017-08-30: qty 5

## 2017-08-30 MED ORDER — DIPHENHYDRAMINE HCL 50 MG/ML IJ SOLN
25.0000 mg | Freq: Once | INTRAMUSCULAR | Status: AC
  Administered 2017-08-30: 25 mg via INTRAVENOUS
  Filled 2017-08-30: qty 1

## 2017-08-30 MED ORDER — SODIUM CHLORIDE 0.9 % IV SOLN
Freq: Once | INTRAVENOUS | Status: AC
  Administered 2017-08-30: 10:00:00 via INTRAVENOUS

## 2017-08-30 MED ORDER — FAMOTIDINE IN NACL 20-0.9 MG/50ML-% IV SOLN
20.0000 mg | Freq: Once | INTRAVENOUS | Status: AC
  Administered 2017-08-30: 20 mg via INTRAVENOUS

## 2017-08-30 MED ORDER — DEXAMETHASONE SODIUM PHOSPHATE 10 MG/ML IJ SOLN
10.0000 mg | Freq: Once | INTRAMUSCULAR | Status: AC
  Administered 2017-08-30: 10 mg via INTRAVENOUS

## 2017-08-30 MED ORDER — SODIUM CHLORIDE 0.9% FLUSH
10.0000 mL | INTRAVENOUS | Status: DC | PRN
  Administered 2017-08-30: 10 mL
  Filled 2017-08-30: qty 10

## 2017-08-30 NOTE — Progress Notes (Signed)
Labs reviewed by Dr. Walden Field. Proceed with treatment today.   Treatment given per orders. Patient tolerated it well without problems. Vitals stable and discharged home from clinic ambulatory. Follow up as scheduled.

## 2017-08-30 NOTE — Progress Notes (Signed)
Diagnosis Prostate cancer metastatic to multiple sites [facility]) - Plan: CBC with Differential/Platelet, Comprehensive metabolic panel, Lactate dehydrogenase  Staging Cancer Staging No matching staging information was found for the patient.  Assessment and Plan:   1.  Prostate cancer metastatic to multiple sites.  72 y.o. 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 Lebron Conners.  Currently undergoing palliative systemic chemotherapy with cabazitaxel and supportive therapy with denosumab.  PSA stable at 0.96 on labs done 06/2017.    Patient is here today for follow-up to go over bone scan done 07/25/2017 that showed: IMPRESSION: Overall, there is again noted to be multiple foci of increased activity in the skeleton compatible with metastatic disease. Some areas are stable while other areas in the axial skeleton have increased suggesting worsening disease.  Areas mentioned to be increased were right mandible, left glenoid region, and right lateral malleolus.  The patient reports recent fall on the right knee based on a stable PSA of 0.96, he will continue cabazitaxel..  Last CT scan of chest abdomen pelvis was done August 2018 and showed metastatic bony disease but no visceral disease.  He will continue treatment every 3 weeks.     He is here today for C17.  Labs adequate for chemotherapy.  He will RTC in 3 weeks for follow-up.     2.  Bone mets.  Bone scan done 07/25/2017 showed: IMPRESSION: Overall, there is again noted to be multiple foci of increased activity in the skeleton compatible with metastatic disease. Some areas are stable while other areas in the axial skeleton have increased suggesting worsening disease.  Areas mentioned to be increased were right mandible, left glenoid region, and right lateral malleolus.  The patient reports recent fall on the right knee.   Based on a stable PSA of 0.96, he will continue cabazitaxel..  Last CT  scan of chest abdomen pelvis was done August 2018 and showed metastatic bony disease but no visceral disease.  He will continue treatment every 3 weeks. He will continue Xgeva.  We will repeat CT chest abdomen pelvis in August 2019.    3.  PAC concerns.    Pt has been seen by surgery.  He tested + for Cocaine so removal as cancelled.  Pt has left sided port today.  R Port site has some skin changes and scarring.  No purulent drainage noted.  WBC is 4.9.  He should notify the office if fevers or any changes in right prior port site.  He reports Dr. Arnoldo Morale instructed him to use peroxide to right chest wall area.    4.  + drug screen.  Pt tested + for cocaine.  He has been seen by social work.  Recent UDS done 08/08/2017 was negative.  Follow-up with social work as directed.    Current Status:  Pt is here today for follow-up.  He has new left PAC.  He denies any fevers.   to go over recent bone scan.      Malignant neoplasm of prostate ([place])   01/26/2014 Tumor Marker    PSA > 5000      01/28/2014 Initial Biopsy    Metastatic adenocarcinoma of prostate      02/05/2014 Surgery    Bilateral orchiectomy by Dr. Junious Silk      02/26/2014 Tumor Marker    PSA = 399.5      03/14/2014 Tumor Marker    PSA= 89.51      03/14/2014 - 06/26/2014 Chemotherapy  Docetaxel 75 mg/kg every 21 days x 6 cycles      04/24/2014 Tumor Marker    PSA- 19.89      07/23/2014 Procedure    Nephrostomy tube removed by IR, Dr. Geroge Baseman      07/24/2014 Tumor Marker    PSA= 6.15      10/16/2014 Tumor Marker    PSA: 3.54       01/08/2015 Tumor Marker    PSA: 2.13       01/17/2015 Imaging    CT CAP-  Massive pelvic and retroperitoneal lymphadenopathy noted on the prior study has nearly completely resolved, now with only a small amount of residual amorphous soft tissue predominantly around the the infrarenal abdominal aorta.      01/17/2015 Imaging    Bone scan- Widespread osseous metastatic disease with  multiple foci of increased activity throughout the skeleton, corresponding with the findings on the prior CT from October, 2015.      04/11/2015 Tumor Marker    PSA: 1.87       07/21/2015 Imaging    Bone scan- Bony metastatic disease again noted at multiple sites, stable from prior study. No progression of bony metastatic disease is demonstrable on this study.      07/25/2015 Imaging    CT CAP- Stable matted soft tissue density in the retroperitoneum and extraperitoneal pelvis consistent with treated disease. No recurrent lymphadenopathy.  No pulmonary metastatic disease. Diffuse stable sclerotic metastatic bone disease.      01/14/2016 Imaging    Bone scan-  Multiple sites of abnormal increased tracer localization involving BILATERAL ribs, T11, T12, questionably RIGHT scapula and LEFT iliac bone suspicious for osseous metastases.      01/23/2016 Imaging    CT CAP- 1. Similar widespread osseous metastasis. 2. Similar soft tissue thickening within the retroperitoneum of the abdomen. Improved left pelvic side wall soft tissue thickening. These are consistent with sites of treated disease. No well-defined adenopathy. 3. No new sites of disease.      05/03/2016 -  Chemotherapy    Jevtana every 21 days       09/06/2016 Imaging    Restaging CT C/A/P: IMPRESSION: 1. Similar appearance of widespread sclerotic bone metastases. 2. No change in soft tissue thickening within the retroperitoneum and left pelvic sidewall. No well defined adenopathy identified. 3. No new sites of disease 4. Aortic Atherosclerosis (ICD10-I70.0). Coronary artery calcifications noted. 5. 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 6. Stable 9 mm pancreatic cystic lesion. Favor pseudocyst. Indolent neoplasm may look similar. Attention on follow-up imaging.      09/06/2016 Imaging    Bone Scan: IMPRESSION: In this patient with known diffuse sclerotic  metastatic disease, bone scan does not reveal progression of radiotracer uptake. Several of the areas of previously demonstrated radiotracer uptake appear less prominent.  Change in appearance of radiotracer uptake involving the mandible now more notable on the right and previously more notable on the left may reflect result of dental disease rather than metastatic disease.       04/08/2017 Tumor Marker    PSA 0.89        Problem List Patient Active Problem List   Diagnosis Date Noted  . 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 B12 deficiency [E53.8] 04/01/2016  . Malnutrition of moderate degree ([place]) [E44.0] 05/24/2014  . Bacteremia [R78.81] 05/24/2014  . Pyrexia [R50.9]   . Sepsis ([place]) [A41.9] 05/23/2014  . UTI (lower urinary tract infection) [N39.0] 05/23/2014  .  Health care maintenance [Z00.00] 02/06/2014  . Homelessness [Z59.0] 01/31/2014  . Malignant neoplasm of prostate ([place]) [C61] 01/30/2014  . Bone lesion [M89.9] 01/26/2014  . Acute renal failure ([place]) [N17.9] 01/26/2014  . Bilateral hydronephrosis [N13.30] 01/26/2014  . Anemia of chronic disease [D63.8] 01/26/2014  . History of cocaine abuse [Z87.898] 01/26/2014    Past Medical History Past Medical History:  Diagnosis Date  . Acute renal failure ([place]) 01/26/2014  . Alcohol abuse   . Anemia of chronic disease 01/26/2014  . Bilateral hydronephrosis 01/26/2014  . History of cocaine abuse 01/26/2014  . Homelessness 01/31/2014  . Prostate cancer metastatic to multiple sites ([place]) 01/30/2014  . Sepsis ([place])   . UTI (lower urinary tract infection)     Past Surgical History Past Surgical History:  Procedure Laterality Date  . ORCHIECTOMY Bilateral 02/05/2014   Procedure: BILATERAL ORCHIECTOMY;  Surgeon: Festus Aloe, MD;  Location: [facility];  Service: Urology;  Laterality: Bilateral;  . PERCUTANEOUS NEPHROSTOMY Bilateral    IR Dr. Junious Silk changed on 04/22/2014  . PORT-A-CATH REMOVAL Right 08/08/2017    Procedure: REMOVAL PORT-A-CATH RIGHT CHEST (PROCEDURE #2);  Surgeon: Aviva Signs, MD;  Location: [facility];  Service: General;  Laterality: Right;  . PORTACATH PLACEMENT Right 03/12/14  . PORTACATH PLACEMENT Left 08/08/2017   Procedure: INSERTION POWER PORT WITH ATTACHED CATHETER LEFT SUBCLAVIAN (PROCEDURE #1);  Surgeon: Aviva Signs, MD;  Location: [facility];  Service: General;  Laterality: Left;    Family History Family History  Problem Relation Age of Onset  . Cancer Brother 84     Social History  reports that he has been smoking cigarettes.  He has a 13.75 pack-year smoking history. He has never used smokeless tobacco. He reports that he drinks about 0.6 oz of alcohol per week. He reports that he has current or past drug history. Drug: Cocaine.  Medications  Current Outpatient Medications:  .  HYDROcodone-acetaminophen (NORCO) 5-325 MG tablet, Take 1 tablet by mouth every 6 (six) hours as needed for moderate pain., Disp: 25 tablet, Rfl: 0 .  lidocaine-prilocaine (EMLA) cream, Apply a quarter size amount to port site 1 hour prior to chemo. Do not rub in. Cover with plastic wrap., Disp: 30 g, Rfl: 3 .  metoCLOPramide (REGLAN) 5 MG tablet, The day after chemo take 1 tab four times a day x 2 days. Then may take 1 tab four times a day if needed for nausea/vomiting., Disp: 60 tablet, Rfl: 2 .  ondansetron (ZOFRAN) 8 MG tablet, Take 1 tablet (8 mg total) by mouth every 8 (eight) hours as needed for nausea or vomiting. (Patient not taking: Reported on 07/26/2017), Disp: 30 tablet, Rfl: 2 .  prochlorperazine (COMPAZINE) 10 MG tablet, The day after chemo take 1 tab four times a day x 2 days. Then may take 1 tab four times a day if needed for nausea/vomiting. (Patient not taking: Reported on 07/26/2017), Disp: 60 tablet, Rfl: 2 No current facility-administered medications for this visit.   Facility-Administered Medications Ordered in Other Visits:  .  cyanocobalamin ((VITAMIN B-12)) injection 1,000 mcg,  1,000 mcg, Intramuscular, Once, Kefalas, Thomas S, PA-C .  denosumab (XGEVA) injection 120 mg, 120 mg, Subcutaneous, Once, Kefalas, Thomas S, PA-C  Allergies Patient has no known allergies.  Review of Systems Review of Systems - Oncology ROS as per HPI otherwise 12 point ROS is negative.   Physical Exam  Vitals Wt Readings from Last 3 Encounters:  08/11/17 167 lb (75.8 kg)  08/09/17 170 lb 3.2 oz (  77.2 kg)  07/26/17 161 lb (73 kg)   Temp Readings from Last 3 Encounters:  08/11/17 98 F (36.7 C)  08/09/17 97.7 F (36.5 C) (Oral)  08/08/17 98.2 F (36.8 C)   BP Readings from Last 3 Encounters:  08/11/17 109/73  08/09/17 108/61  08/08/17 126/78   Pulse Readings from Last 3 Encounters:  08/11/17 81  08/09/17 (!) 58  08/08/17 (!) 58   Constitutional: Well-developed, well-nourished, and in no distress.   HENT: Head: Normocephalic and atraumatic.  Mouth/Throat: No oropharyngeal exudate. Mucosa moist. Eyes: Pupils are equal, round, and reactive to light. Conjunctivae are normal. No scleral icterus.  Neck: Normal range of motion. Neck supple. No JVD present.  Cardiovascular: Normal rate, regular rhythm and normal heart sounds.  Exam reveals no gallop and no friction rub.   No murmur heard. Pulmonary/Chest: Effort normal and breath sounds normal. No respiratory distress. No wheezes.No rales. Scarring noted On right chest wall previous port site.  No purulence or drainage noted.  Area in non-tender.   Abdominal: Soft. Bowel sounds are normal. No distension. There is no tenderness. There is no guarding.  Musculoskeletal: No edema or tenderness.  Lymphadenopathy: No cervical, axillary or supraclavicular adenopathy.  Neurological: Alert and oriented to person, place, and time. No cranial nerve deficit.  Skin: Skin is warm and dry. No rash noted. No erythema. No pallor.  Psychiatric: Affect and judgment normal.   Labs Infusion on 08/30/2017  Component Date Value Ref Range  Status  . WBC 08/30/2017 4.9  4.0 - 10.5 K/uL Final  . RBC 08/30/2017 3.52* 4.22 - 5.81 MIL/uL Final  . Hemoglobin 08/30/2017 10.0* 13.0 - 17.0 g/dL Final  . HCT 08/30/2017 31.3* 39.0 - 52.0 % Final  . MCV 08/30/2017 88.9  78.0 - 100.0 fL Final  . MCH 08/30/2017 28.4  26.0 - 34.0 pg Final  . MCHC 08/30/2017 31.9  30.0 - 36.0 g/dL Final  . RDW 08/30/2017 17.3* 11.5 - 15.5 % Final  . Platelets 08/30/2017 280  150 - 400 K/uL Final  . Neutrophils Relative % 08/30/2017 56  % Final  . Neutro Abs 08/30/2017 2.7  1.7 - 7.7 K/uL Final  . Lymphocytes Relative 08/30/2017 31  % Final  . Lymphs Abs 08/30/2017 1.5  0.7 - 4.0 K/uL Final  . Monocytes Relative 08/30/2017 12  % Final  . Monocytes Absolute 08/30/2017 0.6  0.1 - 1.0 K/uL Final  . Eosinophils Relative 08/30/2017 1  % Final  . Eosinophils Absolute 08/30/2017 0.1  0.0 - 0.7 K/uL Final  . Basophils Relative 08/30/2017 0  % Final  . Basophils Absolute 08/30/2017 0.0  0.0 - 0.1 K/uL Final   Performed at [facility], [address] [place], [place] [address]     Pathology Orders Placed This Encounter  Procedures  . CBC with Differential/Platelet    Standing Status:   Future    Standing Expiration Date:   08/31/2018  . Comprehensive metabolic panel    Standing Status:   Future    Standing Expiration Date:   08/31/2018  . Lactate dehydrogenase    Standing Status:   Future    Standing Expiration Date:   08/31/2018       Zoila Shutter MD

## 2017-08-31 NOTE — Patient Instructions (Signed)
[facility] Discharge Instructions for Patients Receiving Chemotherapy   Beginning January 23rd 2017 lab work for the Cancer Center will be done in the  Main lab at [place] on 1st floor. If you have a lab appointment with the Cancer Center please come in thru the  Main Entrance and check in at the main information desk   Today you received the following chemotherapy agents   To help prevent nausea and vomiting after your treatment, we encourage you to take your nausea medication     If you develop nausea and vomiting, or diarrhea that is not controlled by your medication, call the clinic.  The clinic phone number is [phone]. Office hours are Monday-Friday 8:30am-5:00pm.  BELOW ARE SYMPTOMS THAT SHOULD BE REPORTED IMMEDIATELY:  *FEVER GREATER THAN 101.0 F  *CHILLS WITH OR WITHOUT FEVER  NAUSEA AND VOMITING THAT IS NOT CONTROLLED WITH YOUR NAUSEA MEDICATION  *UNUSUAL SHORTNESS OF BREATH  *UNUSUAL BRUISING OR BLEEDING  TENDERNESS IN MOUTH AND THROAT WITH OR WITHOUT PRESENCE OF ULCERS  *URINARY PROBLEMS  *BOWEL PROBLEMS  UNUSUAL RASH Items with * indicate a potential emergency and should be followed up as soon as possible. If you have an emergency after office hours please contact your primary care physician or go to the nearest emergency department.  Please call the clinic during office hours if you have any questions or concerns.   You may also contact the Patient Navigator at [phone] should you have any questions or need assistance in obtaining follow up care.      Resources For Cancer Patients and their Caregivers ? American Cancer Society: Can assist with transportation, wigs, general needs, runs Look Good Feel Better.        1-888-227-6333 ? Cancer Care: Provides financial assistance, online support groups, medication/co-pay assistance.  1-800-813-HOPE (4673) ? [facility] Assists [place] cancer  patients and their families through emotional , educational and financial support.  336-427-4357 ? [place] DSS Where to apply for food stamps, Medicaid and utility assistance. 336-342-1394 ? [facility]: Transportation to medical appointments. 336-347-2287 ? Social Security Administration: May apply for disability if have a Stage IV cancer. 336-342-7796 1-800-772-1213 ? [place] Aging, Disability and Transit Services: Assists with nutrition, care and transit needs. 336-349-2343         

## 2017-09-12 ENCOUNTER — Other Ambulatory Visit (HOSPITAL_COMMUNITY): Payer: Medicare Other

## 2017-09-12 ENCOUNTER — Ambulatory Visit (HOSPITAL_COMMUNITY): Payer: Medicare Other

## 2017-09-20 ENCOUNTER — Encounter (HOSPITAL_COMMUNITY): Payer: Self-pay | Admitting: Hematology

## 2017-09-20 ENCOUNTER — Other Ambulatory Visit: Payer: Self-pay

## 2017-09-20 ENCOUNTER — Encounter (HOSPITAL_COMMUNITY): Payer: Self-pay

## 2017-09-20 ENCOUNTER — Inpatient Hospital Stay (HOSPITAL_BASED_OUTPATIENT_CLINIC_OR_DEPARTMENT_OTHER): Payer: Medicare Other | Admitting: Hematology

## 2017-09-20 ENCOUNTER — Inpatient Hospital Stay (HOSPITAL_COMMUNITY): Payer: Medicare Other

## 2017-09-20 VITALS — BP 112/80 | HR 71 | Temp 97.5°F | Resp 18 | Wt 166.6 lb

## 2017-09-20 VITALS — BP 118/82 | HR 65 | Temp 97.6°F | Resp 18

## 2017-09-20 DIAGNOSIS — C61 Malignant neoplasm of prostate: Secondary | ICD-10-CM

## 2017-09-20 DIAGNOSIS — M899 Disorder of bone, unspecified: Secondary | ICD-10-CM

## 2017-09-20 DIAGNOSIS — C7951 Secondary malignant neoplasm of bone: Secondary | ICD-10-CM | POA: Diagnosis not present

## 2017-09-20 DIAGNOSIS — R590 Localized enlarged lymph nodes: Secondary | ICD-10-CM

## 2017-09-20 DIAGNOSIS — E538 Deficiency of other specified B group vitamins: Secondary | ICD-10-CM

## 2017-09-20 DIAGNOSIS — F1410 Cocaine abuse, uncomplicated: Secondary | ICD-10-CM

## 2017-09-20 DIAGNOSIS — Z5111 Encounter for antineoplastic chemotherapy: Secondary | ICD-10-CM | POA: Diagnosis not present

## 2017-09-20 DIAGNOSIS — M545 Low back pain: Secondary | ICD-10-CM

## 2017-09-20 LAB — CBC WITH DIFFERENTIAL/PLATELET
BASOS ABS: 0 10*3/uL (ref 0.0–0.1)
Basophils Relative: 0 %
EOS PCT: 1 %
Eosinophils Absolute: 0.1 10*3/uL (ref 0.0–0.7)
HCT: 32 % — ABNORMAL LOW (ref 39.0–52.0)
Hemoglobin: 10.4 g/dL — ABNORMAL LOW (ref 13.0–17.0)
LYMPHS PCT: 20 %
Lymphs Abs: 1.7 10*3/uL (ref 0.7–4.0)
MCH: 29.2 pg (ref 26.0–34.0)
MCHC: 32.5 g/dL (ref 30.0–36.0)
MCV: 89.9 fL (ref 78.0–100.0)
MONO ABS: 1.2 10*3/uL — AB (ref 0.1–1.0)
Monocytes Relative: 13 %
Neutro Abs: 5.9 10*3/uL (ref 1.7–7.7)
Neutrophils Relative %: 66 %
PLATELETS: 288 10*3/uL (ref 150–400)
RBC: 3.56 MIL/uL — ABNORMAL LOW (ref 4.22–5.81)
RDW: 18.6 % — AB (ref 11.5–15.5)
WBC: 8.9 10*3/uL (ref 4.0–10.5)

## 2017-09-20 LAB — COMPREHENSIVE METABOLIC PANEL
ALT: 13 U/L — ABNORMAL LOW (ref 17–63)
ANION GAP: 8 (ref 5–15)
AST: 22 U/L (ref 15–41)
Albumin: 3.7 g/dL (ref 3.5–5.0)
Alkaline Phosphatase: 58 U/L (ref 38–126)
BUN: 13 mg/dL (ref 6–20)
CHLORIDE: 102 mmol/L (ref 101–111)
CO2: 23 mmol/L (ref 22–32)
Calcium: 8.4 mg/dL — ABNORMAL LOW (ref 8.9–10.3)
Creatinine, Ser: 1.38 mg/dL — ABNORMAL HIGH (ref 0.61–1.24)
GFR calc Af Amer: 58 mL/min — ABNORMAL LOW (ref >60)
GFR, EST NON AFRICAN AMERICAN: 50 mL/min — AB (ref >60)
Glucose, Bld: 96 mg/dL (ref 65–99)
POTASSIUM: 4.3 mmol/L (ref 3.5–5.1)
Sodium: 133 mmol/L — ABNORMAL LOW (ref 135–145)
Total Bilirubin: 0.4 mg/dL (ref 0.3–1.2)
Total Protein: 7.7 g/dL (ref 6.5–8.1)

## 2017-09-20 LAB — RAPID URINE DRUG SCREEN, HOSP PERFORMED
AMPHETAMINES: NOT DETECTED
Barbiturates: NOT DETECTED
Benzodiazepines: NOT DETECTED
Cocaine: POSITIVE — AB
Opiates: NOT DETECTED
Tetrahydrocannabinol: NOT DETECTED

## 2017-09-20 LAB — LACTATE DEHYDROGENASE: LDH: 229 U/L — AB (ref 98–192)

## 2017-09-20 MED ORDER — SODIUM CHLORIDE 0.9% FLUSH
10.0000 mL | INTRAVENOUS | Status: DC | PRN
  Administered 2017-09-20: 10 mL
  Filled 2017-09-20: qty 10

## 2017-09-20 MED ORDER — FAMOTIDINE IN NACL 20-0.9 MG/50ML-% IV SOLN
20.0000 mg | Freq: Once | INTRAVENOUS | Status: AC
  Administered 2017-09-20: 20 mg via INTRAVENOUS

## 2017-09-20 MED ORDER — CYANOCOBALAMIN 1000 MCG/ML IJ SOLN
1000.0000 ug | Freq: Once | INTRAMUSCULAR | Status: AC
  Administered 2017-09-20: 1000 ug via INTRAMUSCULAR
  Filled 2017-09-20: qty 1

## 2017-09-20 MED ORDER — DENOSUMAB 120 MG/1.7ML ~~LOC~~ SOLN
120.0000 mg | Freq: Once | SUBCUTANEOUS | Status: AC
  Administered 2017-09-20: 120 mg via SUBCUTANEOUS
  Filled 2017-09-20: qty 1.7

## 2017-09-20 MED ORDER — DEXAMETHASONE SODIUM PHOSPHATE 10 MG/ML IJ SOLN
10.0000 mg | Freq: Once | INTRAMUSCULAR | Status: AC
  Administered 2017-09-20: 10 mg via INTRAVENOUS
  Filled 2017-09-20: qty 1

## 2017-09-20 MED ORDER — SODIUM CHLORIDE 0.9 % IV SOLN
Freq: Once | INTRAVENOUS | Status: AC
  Administered 2017-09-20: 11:00:00 via INTRAVENOUS

## 2017-09-20 MED ORDER — CABAZITAXEL CHEMO INJECTION 60 MG/6ML W/DILUENT
20.0000 mg/m2 | Freq: Once | INTRAVENOUS | Status: AC
  Administered 2017-09-20: 41 mg via INTRAVENOUS
  Filled 2017-09-20: qty 4.1

## 2017-09-20 MED ORDER — FAMOTIDINE IN NACL 20-0.9 MG/50ML-% IV SOLN
INTRAVENOUS | Status: AC
  Filled 2017-09-20: qty 50

## 2017-09-20 MED ORDER — PEGFILGRASTIM 6 MG/0.6ML ~~LOC~~ PSKT
6.0000 mg | PREFILLED_SYRINGE | Freq: Once | SUBCUTANEOUS | Status: AC
  Administered 2017-09-20: 6 mg via SUBCUTANEOUS
  Filled 2017-09-20: qty 0.6

## 2017-09-20 MED ORDER — HEPARIN SOD (PORK) LOCK FLUSH 100 UNIT/ML IV SOLN
500.0000 [IU] | Freq: Once | INTRAVENOUS | Status: AC | PRN
  Administered 2017-09-20: 500 [IU]
  Filled 2017-09-20: qty 5

## 2017-09-20 MED ORDER — DIPHENHYDRAMINE HCL 50 MG/ML IJ SOLN
25.0000 mg | Freq: Once | INTRAMUSCULAR | Status: AC
  Administered 2017-09-20: 25 mg via INTRAVENOUS
  Filled 2017-09-20: qty 1

## 2017-09-20 NOTE — Progress Notes (Signed)
1100 Labs reviewed with and pt seen by Dr. Delton Coombes and pt approved for Jevtana infusion and Xgeva injection per MD                                                   Jeff [name] tolerated Jevtana infusion, Vit B12 and Xgeva injections and Neulasta on-pro well without complaints or incident. Pt denied any tooth or jaw pain and no recent or future dental appts prior to administering the Xgeva. Neulasta on-pro applied to pt's left arm with green indicator light flashing. Pt discharged self ambulatory in satisfactory condition

## 2017-09-20 NOTE — Patient Instructions (Signed)
[facility] at [facility] Discharge Instructions  Today you saw Dr. K.   Thank you for choosing [facility] at [facility] to provide your oncology and hematology care.  To afford each patient quality time with our provider, please arrive at least 15 minutes before your scheduled appointment time.   If you have a lab appointment with the Cancer Center please come in thru the  Main Entrance and check in at the main information desk  You need to re-schedule your appointment should you arrive 10 or more minutes late.  We strive to give you quality time with our providers, and arriving late affects you and other patients whose appointments are after yours.  Also, if you no show three or more times for appointments you may be dismissed from the clinic at the providers discretion.     Again, thank you for choosing [facility].  Our hope is that these requests will decrease the amount of time that you wait before being seen by our physicians.       _____________________________________________________________  Should you have questions after your visit to [facility], please contact our office at [phone] between the hours of 8:30 a.m. and 4:30 p.m.  Voicemails left after 4:30 p.m. will not be returned until the following business day.  For prescription refill requests, have your pharmacy contact our office.       Resources For Cancer Patients and their Caregivers ? American Cancer Society: Can assist with transportation, wigs, general needs, runs Look Good Feel Better.        1-888-227-6333 ? Cancer Care: Provides financial assistance, online support groups, medication/co-pay assistance.  1-800-813-HOPE (4673) ? [facility] Assists [place] cancer patients and their families through emotional , educational and financial support.  336-427-4357 ? [place] DSS Where to apply for food  stamps, Medicaid and utility assistance. 336-342-1394 ? [facility]: Transportation to medical appointments. 336-347-2287 ? Social Security Administration: May apply for disability if have a Stage IV cancer. 336-342-7796 1-800-772-1213 ? [place] Aging, Disability and Transit Services: Assists with nutrition, care and transit needs. 336-349-2343  Cancer Center Support Programs:   > Cancer Support Group  2nd Tuesday of the month 1pm-2pm, Journey Room   > Creative Journey  3rd Tuesday of the month 1130am-1pm, Journey Room    

## 2017-09-20 NOTE — Assessment & Plan Note (Signed)
1.  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 -Last bone scan on 07/25/2017 showed mixed response compared to prior scan on 09/06/2016.  Last CT scan was on 12/20/2016 which did not show any visceral metastasis but showed pulmonary fibrotic changes. - He will proceed with cycle 19 of cabazitaxel without any dose modifications today.  Last PSA on 08/30/2017 was 0.83, down from 0.97 on 08/09/2017. -I plan to repeat CT scan and bone scan 4 months from the last bone scan.  We will see him back in 3 weeks for follow-up.  2.  Bone metastasis: - He is tolerating denosumab very well.  He will proceed with monthly denosumab injection.  3.  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  He had urine positive for cocaine few months ago.  He reports that he went to a party, might have inhaled it resulting in positive test.  He reports that he has been free of cocaine use for the last 4 years.  Prior to that he used to use it and sell it.  We will do a urine drug screen today.  If it is negative I will consider giving him tramadol for back pain control.

## 2017-09-20 NOTE — Patient Instructions (Signed)
[facility] Discharge Instructions for Patients Receiving Chemotherapy   Beginning January 23rd 2017 lab work for the [facility] will be done in the  Main lab at [facility] on 1st floor. If you have a lab appointment with the [place] please come in thru the  Main Entrance and check in at the main information desk   Today you received the following chemotherapy agents Jevtana infusion and Xgeva and Vit B12 injections. Follow-up as scheduled. Call clinic for any questions or concerns  To help prevent nausea and vomiting after your treatment, we encourage you to take your nausea medication   If you develop nausea and vomiting, or diarrhea that is not controlled by your medication, call the clinic.  The clinic phone number is [phone]. Office hours are Monday-Friday 8:30am-5:00pm.  BELOW ARE SYMPTOMS THAT SHOULD BE REPORTED IMMEDIATELY:  *FEVER GREATER THAN 101.0 F  *CHILLS WITH OR WITHOUT FEVER  NAUSEA AND VOMITING THAT IS NOT CONTROLLED WITH YOUR NAUSEA MEDICATION  *UNUSUAL SHORTNESS OF BREATH  *UNUSUAL BRUISING OR BLEEDING  TENDERNESS IN MOUTH AND THROAT WITH OR WITHOUT PRESENCE OF ULCERS  *URINARY PROBLEMS  *BOWEL PROBLEMS  UNUSUAL RASH Items with * indicate a potential emergency and should be followed up as soon as possible. If you have an emergency after office hours please contact your primary care physician or go to the nearest emergency department.  Please call the clinic during office hours if you have any questions or concerns.   You may also contact the Patient Navigator at [phone] should you have any questions or need assistance in obtaining follow up care.      Resources For Cancer Patients and their Caregivers ? American Cancer Society: Can assist with transportation, wigs, general needs, runs Look Good Feel Better.        413 839 1801 ? Cancer Care: Provides financial assistance, online support groups,  medication/co-pay assistance.  1-800-813-HOPE (775)881-8003) ? [facility] Assists [place] cancer patients and their families through emotional , educational and financial support.  (816) 486-8029 ? [place] DSS Where to apply for food stamps, Medicaid and utility assistance. 551-024-0271 ? [facility]: Transportation to medical appointments. (551) 742-1968 ? Social Security Administration: May apply for disability if have a Stage IV cancer. 416-087-1516 9252055534 ? [employer], Disability and Transit Services: Assists with nutrition, care and transit needs. 807 781 3787

## 2017-09-20 NOTE — Progress Notes (Signed)
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  CURRENT THERAPY: Cabazitaxel every 3 weeks.  BRIEF ONCOLOGIC HISTORY:    Malignant neoplasm of prostate ([facility])   01/26/2014 Tumor Marker    PSA > 5000      01/28/2014 Initial Biopsy    Metastatic adenocarcinoma of prostate      02/05/2014 Surgery    Bilateral orchiectomy by Dr. Junious Silk      02/26/2014 Tumor Marker    PSA = 399.5      03/14/2014 Tumor Marker    PSA= 89.51      03/14/2014 - 06/26/2014 Chemotherapy    Docetaxel 75 mg/kg every 21 days x 6 cycles      04/24/2014 Tumor Marker    PSA- 19.89      07/23/2014 Procedure    Nephrostomy tube removed by IR, Dr. Geroge Baseman      07/24/2014 Tumor Marker    PSA= 6.15      10/16/2014 Tumor Marker    PSA: 3.54       01/08/2015 Tumor Marker    PSA: 2.13       01/17/2015 Imaging    CT CAP-  Massive pelvic and retroperitoneal lymphadenopathy noted on the prior study has nearly completely resolved, now with only a small amount of residual amorphous soft tissue predominantly around the the infrarenal abdominal aorta.      01/17/2015 Imaging    Bone scan- Widespread osseous metastatic disease with multiple foci of increased activity throughout the skeleton, corresponding with the findings on the prior CT from October, 2015.      04/11/2015 Tumor Marker    PSA: 1.87       07/21/2015 Imaging    Bone scan- Bony metastatic disease again noted at multiple sites, stable from prior study. No progression of bony metastatic disease is demonstrable on this study.      07/25/2015 Imaging    CT CAP- Stable matted soft tissue density in the retroperitoneum and extraperitoneal pelvis consistent with treated disease. No  recurrent lymphadenopathy.  No pulmonary metastatic disease. Diffuse stable sclerotic metastatic bone disease.      01/14/2016 Imaging    Bone scan-  Multiple sites of abnormal increased tracer localization involving BILATERAL ribs, T11, T12, questionably RIGHT scapula and LEFT iliac bone suspicious for osseous metastases.      01/23/2016 Imaging    CT CAP- 1. Similar widespread osseous metastasis. 2. Similar soft tissue thickening within the retroperitoneum of the abdomen. Improved left pelvic side wall soft tissue thickening. These are consistent with sites of treated disease. No well-defined adenopathy. 3. No new sites of disease.      05/03/2016 -  Chemotherapy    Jevtana every 21 days       09/06/2016 Imaging    Restaging CT C/A/P: IMPRESSION: 1. Similar appearance of widespread sclerotic bone metastases. 2. No change in soft tissue thickening within the retroperitoneum and left pelvic sidewall. No well defined adenopathy identified. 3. No new sites of disease 4. Aortic Atherosclerosis (ICD10-I70.0). Coronary artery calcifications noted. 5. 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 6. Stable 9 mm pancreatic cystic lesion. Favor pseudocyst. Indolent neoplasm may look similar. Attention on follow-up imaging.      09/06/2016  Imaging    Bone Scan: IMPRESSION: In this patient with known diffuse sclerotic metastatic disease, bone scan does not reveal progression of radiotracer uptake. Several of the areas of previously demonstrated radiotracer uptake appear less prominent.  Change in appearance of radiotracer uptake involving the mandible now more notable on the right and previously more notable on the left may reflect result of dental disease rather than metastatic disease.       04/08/2017 Tumor Marker    PSA 0.89        CANCER STAGING: Cancer Staging No matching staging information was found for the  patient.   INTERVAL HISTORY:  Jeff [name] 72 y.o. male returns for routine follow-up and consideration for next cycle of chemotherapy.   He was treated on 08/30/2017, which was his cycle 18 of cabazitaxel.  He denies any tingling or numbness in extremities.  Denies any nausea, vomiting or diarrhea.  No constipation was reported.  No fevers or ER visits noted.  He has about 100% appetite and 75% energy levels.  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  He does report occasional back pain after work.  He requests pain medication.  Denies any headaches or vision changes.  No lightheadedness reported.  REVIEW OF SYSTEMS:  Review of Systems  Constitutional: Positive for fatigue.  Musculoskeletal: Positive for back pain.  All other systems reviewed and are negative.    PAST MEDICAL/SURGICAL HISTORY:  Past Medical History:  Diagnosis Date  . Acute renal failure ([facility]) 01/26/2014  . Alcohol abuse   . Anemia of chronic disease 01/26/2014  . Bilateral hydronephrosis 01/26/2014  . History of cocaine abuse 01/26/2014  . Homelessness 01/31/2014  . Prostate cancer metastatic to multiple sites ([facility]) 01/30/2014  . Sepsis ([facility])   . UTI (lower urinary tract infection)    Past Surgical History:  Procedure Laterality Date  . ORCHIECTOMY Bilateral 02/05/2014   Procedure: BILATERAL ORCHIECTOMY;  Surgeon: Festus Aloe, MD;  Location: [facility];  Service: Urology;  Laterality: Bilateral;  . PERCUTANEOUS NEPHROSTOMY Bilateral    IR Dr. Junious Silk changed on 04/22/2014  . PORT-A-CATH REMOVAL Right 08/08/2017   Procedure: REMOVAL PORT-A-CATH RIGHT CHEST (PROCEDURE #2);  Surgeon: Aviva Signs, MD;  Location: [facility];  Service: General;  Laterality: Right;  . PORTACATH PLACEMENT Right 03/12/14  . PORTACATH PLACEMENT Left 08/08/2017   Procedure: INSERTION POWER PORT WITH ATTACHED CATHETER LEFT SUBCLAVIAN (PROCEDURE #1);  Surgeon: Aviva Signs, MD;  Location: [facility];  Service: General;   Laterality: Left;     SOCIAL HISTORY:  Social History   Socioeconomic History  . Marital status: Single    Spouse name: Not on file  . Number of children: 1  . Years of education: 9th  . Highest education level: Not on file  Occupational History  . Occupation: disabilty    Employer: [employer]eds  . Financial resource strain: Not on file  . Food insecurity:    Worry: Not on file    Inability: Not on file  . Transportation needs:    Medical: Not on file    Non-medical: Not on file  Tobacco Use  . Smoking status: Current Every Day Smoker    Packs/day: 0.25    Years: 55.00    Pack years: 13.75    Types: Cigarettes  . Smokeless tobacco: Never Used  Substance and Sexual Activity  . Alcohol use: Yes    Alcohol/week: 0.6 oz    Types: 1 Cans of beer  per week  . Drug use: Yes    Types: Cocaine    Comment: last used Cocaine 01/25/14  . Sexual activity: Yes  Lifestyle  . Physical activity:    Days per week: Not on file    Minutes per session: Not on file  . Stress: Not on file  Relationships  . Social connections:    Talks on phone: Not on file    Gets together: Not on file    Attends religious service: Not on file    Active member of club or organization: Not on file    Attends meetings of clubs or organizations: Not on file    Relationship status: Not on file  . Intimate partner violence:    Fear of current or ex partner: Not on file    Emotionally abused: Not on file    Physically abused: Not on file    Forced sexual activity: Not on file  Other Topics Concern  . Not on file  Social History Narrative   Lives at [employer] 9th grade   1 son, lives in [place], [place]   Can read and write in native language             FAMILY HISTORY:  Family History  Problem Relation Age of Onset  . Cancer Brother 44    CURRENT MEDICATIONS:  Outpatient Encounter Medications as of 09/20/2017  Medication Sig Note  .  HYDROcodone-acetaminophen (NORCO) 5-325 MG tablet Take 1 tablet by mouth every 6 (six) hours as needed for moderate pain.   [name] Kitchen lidocaine-prilocaine (EMLA) cream Apply a quarter size amount to port site 1 hour prior to chemo. Do not rub in. Cover with plastic wrap. 07/24/2014: Only takes when getting port flushed  . metoCLOPramide (REGLAN) 5 MG tablet The day after chemo take 1 tab four times a day x 2 days. Then may take 1 tab four times a day if needed for nausea/vomiting. 07/24/2014: Just takes as needed  . ondansetron (ZOFRAN) 8 MG tablet Take 1 tablet (8 mg total) by mouth every 8 (eight) hours as needed for nausea or vomiting.   . prochlorperazine (COMPAZINE) 10 MG tablet The day after chemo take 1 tab four times a day x 2 days. Then may take 1 tab four times a day if needed for nausea/vomiting. 07/24/2014: Just takes as needed   Facility-Administered Encounter Medications as of 09/20/2017  Medication  . cyanocobalamin ((VITAMIN B-12)) injection 1,000 mcg  . denosumab (XGEVA) injection 120 mg    ALLERGIES:  No Known Allergies   PHYSICAL EXAM:  ECOG Performance status: 0  Vitals:   09/20/17 1026  BP: 112/80  Pulse: 71  Resp: 18  Temp: (!) 97.5 F (36.4 C)  SpO2: 100%   Filed Weights   09/20/17 1026  Weight: 166 lb 9.6 oz (75.6 kg)    Physical Exam HEENT shows oropharynx has no thrush. Chest is bilaterally clear to auscultation.  Right chest wall site is well-healed at the site of port removal.  Left port is within normal limits. Extremities no edema or cyanosis.  LABORATORY DATA:  I have reviewed the labs as listed.  CBC    Component Value Date/Time   WBC 8.9 09/20/2017 0821   RBC 3.56 (L) 09/20/2017 0821   HGB 10.4 (L) 09/20/2017 0821   HCT 32.0 (L) 09/20/2017 0821   PLT 288 09/20/2017 0821   MCV 89.9 09/20/2017 0821   MCH 29.2 09/20/2017 0821   MCHC 32.5  09/20/2017 0821   RDW 18.6 (H) 09/20/2017 0821   LYMPHSABS 1.7 09/20/2017 0821   MONOABS 1.2 (H) 09/20/2017  0821   EOSABS 0.1 09/20/2017 0821   BASOSABS 0.0 09/20/2017 0821   CMP Latest Ref Rng & Units 09/20/2017 08/30/2017 08/09/2017  Glucose 65 - 99 mg/dL 96 91 92  BUN 6 - 20 mg/dL 13 19 24(H)  Creatinine 0.61 - 1.24 mg/dL 1.38(H) 1.56(H) 1.98(H)  Sodium 135 - 145 mmol/L 133(L) 136 135  Potassium 3.5 - 5.1 mmol/L 4.3 4.4 3.9  Chloride 101 - 111 mmol/L 102 105 101  CO2 22 - 32 mmol/L 23 25 23   Calcium 8.9 - 10.3 mg/dL 8.4(L) 9.3 8.6(L)  Total Protein 6.5 - 8.1 g/dL 7.7 7.6 7.6  Total Bilirubin 0.3 - 1.2 mg/dL 0.4 0.5 0.2(L)  Alkaline Phos 38 - 126 U/L 58 53 52  AST 15 - 41 U/L 22 23 18   ALT 17 - 63 U/L 13(L) 13(L) 12(L)       DIAGNOSTIC IMAGING:  I have independently reviewed his bone scan dated 07/25/2017.  I have also reviewed images of his PET CT scan from 12/20/2016.  No visceral metastasis was noted.    ASSESSMENT & PLAN:   Malignant neoplasm of prostate ([facility]) 1.  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 -Last bone scan on 07/25/2017 showed mixed response compared to prior scan on 09/06/2016.  Last CT scan was on 12/20/2016 which did not show any visceral metastasis but showed pulmonary fibrotic changes. - He will proceed with cycle 19 of cabazitaxel without any dose modifications today.  Last PSA on 08/30/2017 was 0.83, down from 0.97 on 08/09/2017. -I plan to repeat CT scan and bone scan 4 months from the last bone scan.  We will see him back in 3 weeks for follow-up.  2.  Bone metastasis: - He is tolerating denosumab very well.  He will proceed with monthly denosumab injection.  3.  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  He had urine positive for cocaine few months ago.  He reports that he went to a party, might have inhaled it resulting in positive test.  He reports that he has been  free of cocaine use for the last 4 years.  Prior to that he used to use it and sell it.  We will do a urine drug screen today.  If it is negative I will consider giving him tramadol for back pain control.      Orders placed this encounter:  Orders Placed This Encounter  Procedures  . CBC with Differential  . Comprehensive metabolic panel  . PSA      Derek Jack, MD [place] [phone]

## 2017-10-11 ENCOUNTER — Inpatient Hospital Stay (HOSPITAL_BASED_OUTPATIENT_CLINIC_OR_DEPARTMENT_OTHER): Payer: Medicare Other | Admitting: Hematology

## 2017-10-11 ENCOUNTER — Inpatient Hospital Stay (HOSPITAL_COMMUNITY): Payer: Medicare Other | Attending: Hematology

## 2017-10-11 ENCOUNTER — Encounter (HOSPITAL_COMMUNITY): Payer: Self-pay | Admitting: Hematology

## 2017-10-11 ENCOUNTER — Other Ambulatory Visit: Payer: Self-pay

## 2017-10-11 ENCOUNTER — Inpatient Hospital Stay (HOSPITAL_COMMUNITY): Payer: Medicare Other

## 2017-10-11 VITALS — BP 120/67 | HR 63 | Temp 97.5°F | Resp 16

## 2017-10-11 VITALS — BP 110/66 | HR 70 | Temp 97.5°F | Resp 16 | Wt 166.4 lb

## 2017-10-11 DIAGNOSIS — Z5189 Encounter for other specified aftercare: Secondary | ICD-10-CM | POA: Insufficient documentation

## 2017-10-11 DIAGNOSIS — C61 Malignant neoplasm of prostate: Secondary | ICD-10-CM

## 2017-10-11 DIAGNOSIS — C7951 Secondary malignant neoplasm of bone: Secondary | ICD-10-CM | POA: Insufficient documentation

## 2017-10-11 DIAGNOSIS — Z5111 Encounter for antineoplastic chemotherapy: Secondary | ICD-10-CM | POA: Insufficient documentation

## 2017-10-11 LAB — CBC WITH DIFFERENTIAL/PLATELET
BASOS ABS: 0 10*3/uL (ref 0.0–0.1)
Basophils Relative: 0 %
Eosinophils Absolute: 0.1 10*3/uL (ref 0.0–0.7)
Eosinophils Relative: 1 %
HCT: 32 % — ABNORMAL LOW (ref 39.0–52.0)
HEMOGLOBIN: 10.1 g/dL — AB (ref 13.0–17.0)
LYMPHS ABS: 1.7 10*3/uL (ref 0.7–4.0)
LYMPHS PCT: 29 %
MCH: 29.4 pg (ref 26.0–34.0)
MCHC: 31.6 g/dL (ref 30.0–36.0)
MCV: 93 fL (ref 78.0–100.0)
Monocytes Absolute: 0.8 10*3/uL (ref 0.1–1.0)
Monocytes Relative: 14 %
Neutro Abs: 3.2 10*3/uL (ref 1.7–7.7)
Neutrophils Relative %: 56 %
Platelets: 263 10*3/uL (ref 150–400)
RBC: 3.44 MIL/uL — AB (ref 4.22–5.81)
RDW: 19.3 % — ABNORMAL HIGH (ref 11.5–15.5)
WBC: 5.7 10*3/uL (ref 4.0–10.5)

## 2017-10-11 LAB — PSA: PROSTATIC SPECIFIC ANTIGEN: 0.69 ng/mL (ref 0.00–4.00)

## 2017-10-11 LAB — COMPREHENSIVE METABOLIC PANEL
ALK PHOS: 55 U/L (ref 38–126)
ALT: 18 U/L (ref 17–63)
AST: 25 U/L (ref 15–41)
Albumin: 3.6 g/dL (ref 3.5–5.0)
Anion gap: 5 (ref 5–15)
BUN: 16 mg/dL (ref 6–20)
CALCIUM: 7.6 mg/dL — AB (ref 8.9–10.3)
CHLORIDE: 109 mmol/L (ref 101–111)
CO2: 22 mmol/L (ref 22–32)
CREATININE: 1.32 mg/dL — AB (ref 0.61–1.24)
GFR, EST NON AFRICAN AMERICAN: 53 mL/min — AB (ref >60)
Glucose, Bld: 93 mg/dL (ref 65–99)
Potassium: 4.9 mmol/L (ref 3.5–5.1)
Sodium: 136 mmol/L (ref 135–145)
TOTAL PROTEIN: 7.2 g/dL (ref 6.5–8.1)

## 2017-10-11 MED ORDER — DEXTROSE 5 % IV SOLN
20.0000 mg/m2 | Freq: Once | INTRAVENOUS | Status: AC
  Administered 2017-10-11: 41 mg via INTRAVENOUS
  Filled 2017-10-11: qty 4.1

## 2017-10-11 MED ORDER — PEGFILGRASTIM 6 MG/0.6ML ~~LOC~~ PSKT
6.0000 mg | PREFILLED_SYRINGE | Freq: Once | SUBCUTANEOUS | Status: AC
  Administered 2017-10-11: 6 mg via SUBCUTANEOUS
  Filled 2017-10-11: qty 0.6

## 2017-10-11 MED ORDER — DEXAMETHASONE SODIUM PHOSPHATE 10 MG/ML IJ SOLN
INTRAMUSCULAR | Status: AC
  Filled 2017-10-11: qty 1

## 2017-10-11 MED ORDER — HEPARIN SOD (PORK) LOCK FLUSH 100 UNIT/ML IV SOLN
500.0000 [IU] | Freq: Once | INTRAVENOUS | Status: AC | PRN
  Administered 2017-10-11: 500 [IU]

## 2017-10-11 MED ORDER — SODIUM CHLORIDE 0.9% FLUSH
10.0000 mL | INTRAVENOUS | Status: DC | PRN
  Administered 2017-10-11: 10 mL
  Filled 2017-10-11: qty 10

## 2017-10-11 MED ORDER — FAMOTIDINE IN NACL 20-0.9 MG/50ML-% IV SOLN
20.0000 mg | Freq: Once | INTRAVENOUS | Status: AC
  Administered 2017-10-11: 20 mg via INTRAVENOUS

## 2017-10-11 MED ORDER — DIPHENHYDRAMINE HCL 50 MG/ML IJ SOLN
INTRAMUSCULAR | Status: AC
  Filled 2017-10-11: qty 1

## 2017-10-11 MED ORDER — DIPHENHYDRAMINE HCL 50 MG/ML IJ SOLN
25.0000 mg | Freq: Once | INTRAMUSCULAR | Status: AC
  Administered 2017-10-11: 25 mg via INTRAVENOUS

## 2017-10-11 MED ORDER — FAMOTIDINE IN NACL 20-0.9 MG/50ML-% IV SOLN
INTRAVENOUS | Status: AC
  Filled 2017-10-11: qty 50

## 2017-10-11 MED ORDER — SODIUM CHLORIDE 0.9 % IV SOLN
Freq: Once | INTRAVENOUS | Status: AC
  Administered 2017-10-11: 11:00:00 via INTRAVENOUS

## 2017-10-11 MED ORDER — DEXAMETHASONE SODIUM PHOSPHATE 10 MG/ML IJ SOLN
10.0000 mg | Freq: Once | INTRAMUSCULAR | Status: AC
  Administered 2017-10-11: 10 mg via INTRAVENOUS

## 2017-10-11 NOTE — Patient Instructions (Signed)
[facility] Discharge Instructions for Patients Receiving Chemotherapy  Today you received the following chemotherapy agents jevtana today.    If you develop nausea and vomiting that is not controlled by your nausea medication, call the clinic.   BELOW ARE SYMPTOMS THAT SHOULD BE REPORTED IMMEDIATELY:  *FEVER GREATER THAN 100.5 F  *CHILLS WITH OR WITHOUT FEVER  NAUSEA AND VOMITING THAT IS NOT CONTROLLED WITH YOUR NAUSEA MEDICATION  *UNUSUAL SHORTNESS OF BREATH  *UNUSUAL BRUISING OR BLEEDING  TENDERNESS IN MOUTH AND THROAT WITH OR WITHOUT PRESENCE OF ULCERS  *URINARY PROBLEMS  *BOWEL PROBLEMS  UNUSUAL RASH Items with * indicate a potential emergency and should be followed up as soon as possible.  Feel free to call the clinic should you have any questions or concerns. The clinic phone number is [phone].  Please show the [facility] at check-in to the Emergency Department and triage nurse.

## 2017-10-11 NOTE — Progress Notes (Signed)
Patient seen by Dr. Delton Coombes with lab review and ok to treat today with chemotherapy verbal order Dr. Delton Coombes.   Patient tolerated chemotherapy with no complaints voiced.  Port site clean and dry with no bruising or swelling noted at site.  Good blood return noted before and after administration of chemo.  Band aid applied.  Neulasta Onpro applied to left arm with green indicator light flashing and no alarms noted.  VSS with discharge and left ambulatory with no s/s of distress noted.

## 2017-10-11 NOTE — Progress Notes (Signed)
[place], [place] [address]   CLINIC:  Medical Oncology/Hematology  PCP:  Holley Bouche, NP No address on file None   REASON FOR VISIT:  Follow-up for metastatic prostate cancer.  CURRENT THERAPY: Cabazitaxel every 3 weeks.  BRIEF ONCOLOGIC HISTORY:    Malignant neoplasm of prostate ([place])   01/26/2014 Tumor Marker    PSA > 5000      01/28/2014 Initial Biopsy    Metastatic adenocarcinoma of prostate      02/05/2014 Surgery    Bilateral orchiectomy by Dr. Junious Silk      02/26/2014 Tumor Marker    PSA = 399.5      03/14/2014 Tumor Marker    PSA= 89.51      03/14/2014 - 06/26/2014 Chemotherapy    Docetaxel 75 mg/kg every 21 days x 6 cycles      04/24/2014 Tumor Marker    PSA- 19.89      07/23/2014 Procedure    Nephrostomy tube removed by IR, Dr. Geroge Baseman      07/24/2014 Tumor Marker    PSA= 6.15      10/16/2014 Tumor Marker    PSA: 3.54       01/08/2015 Tumor Marker    PSA: 2.13       01/17/2015 Imaging    CT CAP-  Massive pelvic and retroperitoneal lymphadenopathy noted on the prior study has nearly completely resolved, now with only a small amount of residual amorphous soft tissue predominantly around the the infrarenal abdominal aorta.      01/17/2015 Imaging    Bone scan- Widespread osseous metastatic disease with multiple foci of increased activity throughout the skeleton, corresponding with the findings on the prior CT from October, 2015.      04/11/2015 Tumor Marker    PSA: 1.87       07/21/2015 Imaging    Bone scan- Bony metastatic disease again noted at multiple sites, stable from prior study. No progression of bony metastatic disease is demonstrable on this study.      07/25/2015 Imaging    CT CAP- Stable matted soft tissue density in the retroperitoneum and extraperitoneal pelvis consistent with treated disease. No recurrent lymphadenopathy.  No pulmonary metastatic disease. Diffuse stable  sclerotic metastatic bone disease.      01/14/2016 Imaging    Bone scan-  Multiple sites of abnormal increased tracer localization involving BILATERAL ribs, T11, T12, questionably RIGHT scapula and LEFT iliac bone suspicious for osseous metastases.      01/23/2016 Imaging    CT CAP- 1. Similar widespread osseous metastasis. 2. Similar soft tissue thickening within the retroperitoneum of the abdomen. Improved left pelvic side wall soft tissue thickening. These are consistent with sites of treated disease. No well-defined adenopathy. 3. No new sites of disease.      05/03/2016 -  Chemotherapy    Jevtana every 21 days       09/06/2016 Imaging    Restaging CT C/A/P: IMPRESSION: 1. Similar appearance of widespread sclerotic bone metastases. 2. No change in soft tissue thickening within the retroperitoneum and left pelvic sidewall. No well defined adenopathy identified. 3. No new sites of disease 4. Aortic Atherosclerosis (ICD10-I70.0). Coronary artery calcifications noted. 5. 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 6. Stable 9 mm pancreatic cystic lesion. Favor pseudocyst. Indolent neoplasm may look similar. Attention on follow-up imaging.      09/06/2016 Imaging    Bone Scan: IMPRESSION: In this patient with  known diffuse sclerotic metastatic disease, bone scan does not reveal progression of radiotracer uptake. Several of the areas of previously demonstrated radiotracer uptake appear less prominent.  Change in appearance of radiotracer uptake involving the mandible now more notable on the right and previously more notable on the left may reflect result of dental disease rather than metastatic disease.       04/08/2017 Tumor Marker    PSA 0.89        CANCER STAGING: Cancer Staging No matching staging information was found for the patient.   INTERVAL HISTORY:  [name] 72 y.o. male returns for  routine follow-up and consideration for next cycle of chemotherapy.   He is here for next cycle of cabazitaxel.  He complains of back pain which is 8 out of 10 in intensity.  He is continuing to work full-time job daily.  He denies any tingling or numbness in the extremities.  Denies any fevers or infections.  No ER visits were reported.  No new onset pains.  REVIEW OF SYSTEMS:  Review of Systems  Musculoskeletal: Positive for back pain.  All other systems reviewed and are negative.    PAST MEDICAL/SURGICAL HISTORY:  Past Medical History:  Diagnosis Date  . Acute renal failure ([place]) 01/26/2014  . Alcohol abuse   . Anemia of chronic disease 01/26/2014  . Bilateral hydronephrosis 01/26/2014  . History of cocaine abuse 01/26/2014  . Homelessness 01/31/2014  . Prostate cancer metastatic to multiple sites ([place]) 01/30/2014  . Sepsis ([place])   . UTI (lower urinary tract infection)    Past Surgical History:  Procedure Laterality Date  . ORCHIECTOMY Bilateral 02/05/2014   Procedure: BILATERAL ORCHIECTOMY;  Surgeon: Festus Aloe, MD;  Location: [facility];  Service: Urology;  Laterality: Bilateral;  . PERCUTANEOUS NEPHROSTOMY Bilateral    IR Dr. Junious Silk changed on 04/22/2014  . PORT-A-CATH REMOVAL Right 08/08/2017   Procedure: REMOVAL PORT-A-CATH RIGHT CHEST (PROCEDURE #2);  Surgeon: Aviva Signs, MD;  Location: [facility];  Service: General;  Laterality: Right;  . PORTACATH PLACEMENT Right 03/12/14  . PORTACATH PLACEMENT Left 08/08/2017   Procedure: INSERTION POWER PORT WITH ATTACHED CATHETER LEFT SUBCLAVIAN (PROCEDURE #1);  Surgeon: Aviva Signs, MD;  Location: [facility];  Service: General;  Laterality: Left;     SOCIAL HISTORY:  Social History   Socioeconomic History  . Marital status: Single    Spouse name: Not on file  . Number of children: 1  . Years of education: 9th  . Highest education level: Not on file  Occupational History  . Occupation: disabilty    Employer: [employer]  . Financial resource strain: Not on file  . Food insecurity:    Worry: Not on file    Inability: Not on file  . Transportation needs:    Medical: Not on file    Non-medical: Not on file  Tobacco Use  . Smoking status: Current Every Day Smoker    Packs/day: 0.25    Years: 55.00    Pack years: 13.75    Types: Cigarettes  . Smokeless tobacco: Never Used  Substance and Sexual Activity  . Alcohol use: Yes    Alcohol/week: 0.6 oz    Types: 1 Cans of beer per week  . Drug use: Yes    Types: Cocaine    Comment: last used Cocaine 01/25/14  . Sexual activity: Yes  Lifestyle  . Physical activity:    Days per week: Not on file    Minutes  per session: Not on file  . Stress: Not on file  Relationships  . Social connections:    Talks on phone: Not on file    Gets together: Not on file    Attends religious service: Not on file    Active member of club or organization: Not on file    Attends meetings of clubs or organizations: Not on file    Relationship status: Not on file  . Intimate partner violence:    Fear of current or ex partner: Not on file    Emotionally abused: Not on file    Physically abused: Not on file    Forced sexual activity: Not on file  Other Topics Concern  . Not on file  Social History Narrative   Lives at [employer] grade   1 son, lives in [place], [place]   Can read and write in native language             FAMILY HISTORY:  Family History  Problem Relation Age of Onset  . Cancer Brother 81    CURRENT MEDICATIONS:  Outpatient Encounter Medications as of 10/11/2017  Medication Sig Note  . HYDROcodone-acetaminophen (NORCO) 5-325 MG tablet Take 1 tablet by mouth every 6 (six) hours as needed for moderate pain.   [name] Kitchen lidocaine-prilocaine (EMLA) cream Apply a quarter size amount to port site 1 hour prior to chemo. Do not rub in. Cover with plastic wrap. 07/24/2014: Only takes when getting port flushed  . metoCLOPramide  (REGLAN) 5 MG tablet The day after chemo take 1 tab four times a day x 2 days. Then may take 1 tab four times a day if needed for nausea/vomiting. 07/24/2014: Just takes as needed  . ondansetron (ZOFRAN) 8 MG tablet Take 1 tablet (8 mg total) by mouth every 8 (eight) hours as needed for nausea or vomiting.   . prochlorperazine (COMPAZINE) 10 MG tablet The day after chemo take 1 tab four times a day x 2 days. Then may take 1 tab four times a day if needed for nausea/vomiting. 07/24/2014: Just takes as needed   Facility-Administered Encounter Medications as of 10/11/2017  Medication  . cyanocobalamin ((VITAMIN B-12)) injection 1,000 mcg  . denosumab (XGEVA) injection 120 mg    ALLERGIES:  No Known Allergies   PHYSICAL EXAM:  ECOG Performance status: 1  Vitals:   10/11/17 0930  BP: 110/66  Pulse: 70  Resp: 16  Temp: (!) 97.5 F (36.4 C)  SpO2: 99%   Filed Weights   10/11/17 0930  Weight: 166 lb 6.4 oz (75.5 kg)    Physical Exam   LABORATORY DATA:  I have reviewed the labs as listed.  CBC    Component Value Date/Time   WBC 5.7 10/11/2017 0835   RBC 3.44 (L) 10/11/2017 0835   HGB 10.1 (L) 10/11/2017 0835   HCT 32.0 (L) 10/11/2017 0835   PLT 263 10/11/2017 0835   MCV 93.0 10/11/2017 0835   MCH 29.4 10/11/2017 0835   MCHC 31.6 10/11/2017 0835   RDW 19.3 (H) 10/11/2017 0835   LYMPHSABS 1.7 10/11/2017 0835   MONOABS 0.8 10/11/2017 0835   EOSABS 0.1 10/11/2017 0835   BASOSABS 0.0 10/11/2017 0835   CMP Latest Ref Rng & Units 10/11/2017 09/20/2017 08/30/2017  Glucose 65 - 99 mg/dL 93 96 91  BUN 6 - 20 mg/dL 16 13 19   Creatinine 0.61 - 1.24 mg/dL 1.32(H) 1.38(H) 1.56(H)  Sodium 135 - 145 mmol/L 136 133(L) 136  Potassium 3.5 - 5.1 mmol/L 4.9 4.3 4.4  Chloride 101 - 111 mmol/L 109 102 105  CO2 22 - 32 mmol/L 22 23 25   Calcium 8.9 - 10.3 mg/dL 7.6(L) 8.4(L) 9.3  Total Protein 6.5 - 8.1 g/dL 7.2 7.7 7.6  Total Bilirubin 0.3 - 1.2 mg/dL <0.1(L) 0.4 0.5  Alkaline Phos 38 - 126  U/L 55 58 53  AST 15 - 41 U/L 25 22 23   ALT 17 - 63 U/L 18 13(L) 13(L)         ASSESSMENT & PLAN:   Malignant neoplasm of prostate ([facility]) 1.  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 -Last bone scan on 07/25/2017 showed mixed response compared to prior scan on 09/06/2016.  Last CT scan was on 12/20/2016 which did not show any visceral metastasis but showed pulmonary fibrotic changes. - He will proceed with next cycle of cabazitaxel today.  Last PSA on 08/30/2017 was 0.83, down from 0.97 on 08/09/2017. -I will see him back in 3 weeks for follow-up.  I will schedule a bone scan prior to next visit.  2.  Bone metastasis: - He is tolerating denosumab very well.  He will proceed with monthly denosumab injection.  3.  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  He had urine positive for cocaine few months ago.  He reports that he went to a party, might have inhaled it resulting in positive test.  He reports that he has been free of cocaine use for the last 4 years.  Prior to that he used to use it and sell it.  We have done a urine drug screen 3 weeks ago.  It came back positive for cocaine again.  Hence I did not give him any pain medication.      Orders placed this encounter:  Orders Placed This Encounter  Procedures  . NM Bone Scan Whole Body  . CBC with Differential ([place] Only)  . CMP ([place] only)  . PSA      Derek Jack, MD [place] [phone]

## 2017-10-11 NOTE — Assessment & Plan Note (Signed)
1.  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 -Last bone scan on 07/25/2017 showed mixed response compared to prior scan on 09/06/2016.  Last CT scan was on 12/20/2016 which did not show any visceral metastasis but showed pulmonary fibrotic changes. - He will proceed with next cycle of cabazitaxel today.  Last PSA on 08/30/2017 was 0.83, down from 0.97 on 08/09/2017. -I will see him back in 3 weeks for follow-up.  I will schedule a bone scan prior to next visit.  2.  Bone metastasis: - He is tolerating denosumab very well.  He will proceed with monthly denosumab injection.  3.  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  He had urine positive for cocaine few months ago.  He reports that he went to a party, might have inhaled it resulting in positive test.  He reports that he has been free of cocaine use for the last 4 years.  Prior to that he used to use it and sell it.  We have done a urine drug screen 3 weeks ago.  It came back positive for cocaine again.  Hence I did not give him any pain medication.

## 2017-10-31 ENCOUNTER — Encounter (HOSPITAL_COMMUNITY): Payer: Medicare Other

## 2017-10-31 ENCOUNTER — Other Ambulatory Visit (HOSPITAL_COMMUNITY): Payer: Medicare Other

## 2017-11-01 ENCOUNTER — Ambulatory Visit (HOSPITAL_COMMUNITY): Payer: Medicare Other

## 2017-11-01 ENCOUNTER — Other Ambulatory Visit (HOSPITAL_COMMUNITY): Payer: Medicare Other

## 2017-11-02 ENCOUNTER — Encounter (HOSPITAL_COMMUNITY): Payer: Self-pay | Admitting: Hematology

## 2017-11-02 ENCOUNTER — Inpatient Hospital Stay (HOSPITAL_COMMUNITY): Payer: Medicare Other | Attending: Hematology

## 2017-11-02 ENCOUNTER — Other Ambulatory Visit (HOSPITAL_COMMUNITY): Payer: Medicare Other

## 2017-11-02 ENCOUNTER — Inpatient Hospital Stay (HOSPITAL_BASED_OUTPATIENT_CLINIC_OR_DEPARTMENT_OTHER): Payer: Medicare Other | Admitting: Hematology

## 2017-11-02 ENCOUNTER — Encounter (HOSPITAL_COMMUNITY): Payer: Self-pay

## 2017-11-02 ENCOUNTER — Other Ambulatory Visit: Payer: Self-pay

## 2017-11-02 VITALS — BP 120/74 | HR 55 | Temp 97.9°F | Resp 18 | Wt 156.8 lb

## 2017-11-02 DIAGNOSIS — M545 Low back pain: Secondary | ICD-10-CM | POA: Insufficient documentation

## 2017-11-02 DIAGNOSIS — Z5111 Encounter for antineoplastic chemotherapy: Secondary | ICD-10-CM | POA: Diagnosis present

## 2017-11-02 DIAGNOSIS — E538 Deficiency of other specified B group vitamins: Secondary | ICD-10-CM

## 2017-11-02 DIAGNOSIS — C7951 Secondary malignant neoplasm of bone: Secondary | ICD-10-CM | POA: Insufficient documentation

## 2017-11-02 DIAGNOSIS — M899 Disorder of bone, unspecified: Secondary | ICD-10-CM

## 2017-11-02 DIAGNOSIS — C61 Malignant neoplasm of prostate: Secondary | ICD-10-CM | POA: Insufficient documentation

## 2017-11-02 DIAGNOSIS — Z5189 Encounter for other specified aftercare: Secondary | ICD-10-CM | POA: Insufficient documentation

## 2017-11-02 LAB — CBC WITH DIFFERENTIAL/PLATELET
Basophils Absolute: 0 10*3/uL (ref 0.0–0.1)
Basophils Relative: 0 %
EOS PCT: 2 %
Eosinophils Absolute: 0.1 10*3/uL (ref 0.0–0.7)
HCT: 32.6 % — ABNORMAL LOW (ref 39.0–52.0)
Hemoglobin: 10.7 g/dL — ABNORMAL LOW (ref 13.0–17.0)
LYMPHS ABS: 1.4 10*3/uL (ref 0.7–4.0)
LYMPHS PCT: 22 %
MCH: 30.3 pg (ref 26.0–34.0)
MCHC: 32.8 g/dL (ref 30.0–36.0)
MCV: 92.4 fL (ref 78.0–100.0)
MONO ABS: 0.7 10*3/uL (ref 0.1–1.0)
Monocytes Relative: 11 %
Neutro Abs: 4.3 10*3/uL (ref 1.7–7.7)
Neutrophils Relative %: 65 %
PLATELETS: 388 10*3/uL (ref 150–400)
RBC: 3.53 MIL/uL — AB (ref 4.22–5.81)
RDW: 18.4 % — ABNORMAL HIGH (ref 11.5–15.5)
WBC: 6.5 10*3/uL (ref 4.0–10.5)

## 2017-11-02 LAB — COMPREHENSIVE METABOLIC PANEL
ALBUMIN: 4.1 g/dL (ref 3.5–5.0)
ALT: 22 U/L (ref 0–44)
AST: 32 U/L (ref 15–41)
Alkaline Phosphatase: 63 U/L (ref 38–126)
Anion gap: 9 (ref 5–15)
BUN: 23 mg/dL (ref 8–23)
CHLORIDE: 105 mmol/L (ref 98–111)
CO2: 23 mmol/L (ref 22–32)
Calcium: 9.4 mg/dL (ref 8.9–10.3)
Creatinine, Ser: 1.81 mg/dL — ABNORMAL HIGH (ref 0.61–1.24)
GFR calc Af Amer: 42 mL/min — ABNORMAL LOW (ref >60)
GFR calc non Af Amer: 36 mL/min — ABNORMAL LOW (ref >60)
GLUCOSE: 105 mg/dL — AB (ref 70–99)
POTASSIUM: 4.6 mmol/L (ref 3.5–5.1)
SODIUM: 137 mmol/L (ref 135–145)
Total Bilirubin: 0.3 mg/dL (ref 0.3–1.2)
Total Protein: 8.1 g/dL (ref 6.5–8.1)

## 2017-11-02 LAB — PSA: PROSTATIC SPECIFIC ANTIGEN: 0.78 ng/mL (ref 0.00–4.00)

## 2017-11-02 MED ORDER — CYANOCOBALAMIN 1000 MCG/ML IJ SOLN
1000.0000 ug | Freq: Once | INTRAMUSCULAR | Status: AC
  Administered 2017-11-02: 1000 ug via INTRAMUSCULAR
  Filled 2017-11-02: qty 1

## 2017-11-02 MED ORDER — DENOSUMAB 120 MG/1.7ML ~~LOC~~ SOLN
120.0000 mg | Freq: Once | SUBCUTANEOUS | Status: AC
  Administered 2017-11-02: 120 mg via SUBCUTANEOUS
  Filled 2017-11-02: qty 1.7

## 2017-11-02 MED ORDER — PEGFILGRASTIM 6 MG/0.6ML ~~LOC~~ PSKT
6.0000 mg | PREFILLED_SYRINGE | Freq: Once | SUBCUTANEOUS | Status: AC
  Administered 2017-11-02: 6 mg via SUBCUTANEOUS
  Filled 2017-11-02: qty 0.6

## 2017-11-02 MED ORDER — DEXAMETHASONE SODIUM PHOSPHATE 10 MG/ML IJ SOLN
10.0000 mg | Freq: Once | INTRAMUSCULAR | Status: AC
  Administered 2017-11-02: 10 mg via INTRAVENOUS
  Filled 2017-11-02: qty 1

## 2017-11-02 MED ORDER — FAMOTIDINE IN NACL 20-0.9 MG/50ML-% IV SOLN
20.0000 mg | Freq: Once | INTRAVENOUS | Status: AC
  Administered 2017-11-02: 20 mg via INTRAVENOUS
  Filled 2017-11-02: qty 50

## 2017-11-02 MED ORDER — SODIUM CHLORIDE 0.9 % IV SOLN
Freq: Once | INTRAVENOUS | Status: AC
  Administered 2017-11-02: 12:00:00 via INTRAVENOUS

## 2017-11-02 MED ORDER — HEPARIN SOD (PORK) LOCK FLUSH 100 UNIT/ML IV SOLN
500.0000 [IU] | Freq: Once | INTRAVENOUS | Status: AC | PRN
  Administered 2017-11-02: 500 [IU]

## 2017-11-02 MED ORDER — DEXTROSE 5 % IV SOLN
20.0000 mg/m2 | Freq: Once | INTRAVENOUS | Status: AC
  Administered 2017-11-02: 41 mg via INTRAVENOUS
  Filled 2017-11-02: qty 4.1

## 2017-11-02 MED ORDER — DIPHENHYDRAMINE HCL 50 MG/ML IJ SOLN
25.0000 mg | Freq: Once | INTRAMUSCULAR | Status: AC
  Administered 2017-11-02: 25 mg via INTRAVENOUS
  Filled 2017-11-02: qty 1

## 2017-11-02 NOTE — Progress Notes (Signed)
Tolerated infusion w/o adverse reaction.  Alert, in no distress.  VSS.  Discharged ambulatory.  

## 2017-11-02 NOTE — Assessment & Plan Note (Signed)
1.  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 -Last bone scan on 07/25/2017 showed mixed response compared to prior scan on 09/06/2016.  Last CT scan was on 12/20/2016 which did not show any visceral metastasis but showed pulmonary fibrotic changes. - His PSA on 10/11/2017 continue to improve and was 0.69.  He is tolerating cabazitaxel very well without any problems.  He is continuing to work.  Denies any tingling or numbness in extremities.  Denies any GI symptoms.  He will proceed with his next cycle today and in 3 weeks.  I will see him back in 6 weeks for follow-up.  I will do scans only if PSA trends up.  2.  Bone metastasis: - He will continue denosumab injections as he is tolerating them very well.  3.  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  However his urine drug screen was positive for cocaine twice.  Hence I did not give any prescription pain medication.

## 2017-11-02 NOTE — Progress Notes (Signed)
[place], [place] [address]   CLINIC:  Medical Oncology/Hematology  PCP:  Holley Bouche, NP No address on file None   REASON FOR VISIT:  Follow-up for metastatic prostate cancer  CURRENT THERAPY: cabazitaxel every 3 weeks   BRIEF ONCOLOGIC HISTORY:    Malignant neoplasm of prostate ([facility])   01/26/2014 Tumor Marker    PSA > 5000      01/28/2014 Initial Biopsy    Metastatic adenocarcinoma of prostate      02/05/2014 Surgery    Bilateral orchiectomy by Dr. Junious Silk      02/26/2014 Tumor Marker    PSA = 399.5      03/14/2014 Tumor Marker    PSA= 89.51      03/14/2014 - 06/26/2014 Chemotherapy    Docetaxel 75 mg/kg every 21 days x 6 cycles      04/24/2014 Tumor Marker    PSA- 19.89      07/23/2014 Procedure    Nephrostomy tube removed by IR, Dr. Geroge Baseman      07/24/2014 Tumor Marker    PSA= 6.15      10/16/2014 Tumor Marker    PSA: 3.54       01/08/2015 Tumor Marker    PSA: 2.13       01/17/2015 Imaging    CT CAP-  Massive pelvic and retroperitoneal lymphadenopathy noted on the prior study has nearly completely resolved, now with only a small amount of residual amorphous soft tissue predominantly around the the infrarenal abdominal aorta.      01/17/2015 Imaging    Bone scan- Widespread osseous metastatic disease with multiple foci of increased activity throughout the skeleton, corresponding with the findings on the prior CT from October, 2015.      04/11/2015 Tumor Marker    PSA: 1.87       07/21/2015 Imaging    Bone scan- Bony metastatic disease again noted at multiple sites, stable from prior study. No progression of bony metastatic disease is demonstrable on this study.      07/25/2015 Imaging    CT CAP- Stable matted soft tissue density in the retroperitoneum and extraperitoneal pelvis consistent with treated disease. No recurrent lymphadenopathy.  No pulmonary metastatic disease. Diffuse stable  sclerotic metastatic bone disease.      01/14/2016 Imaging    Bone scan-  Multiple sites of abnormal increased tracer localization involving BILATERAL ribs, T11, T12, questionably RIGHT scapula and LEFT iliac bone suspicious for osseous metastases.      01/23/2016 Imaging    CT CAP- 1. Similar widespread osseous metastasis. 2. Similar soft tissue thickening within the retroperitoneum of the abdomen. Improved left pelvic side wall soft tissue thickening. These are consistent with sites of treated disease. No well-defined adenopathy. 3. No new sites of disease.      05/03/2016 -  Chemotherapy    Jevtana every 21 days       09/06/2016 Imaging    Restaging CT C/A/P: IMPRESSION: 1. Similar appearance of widespread sclerotic bone metastases. 2. No change in soft tissue thickening within the retroperitoneum and left pelvic sidewall. No well defined adenopathy identified. 3. No new sites of disease 4. Aortic Atherosclerosis (ICD10-I70.0). Coronary artery calcifications noted. 5. 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 6. Stable 9 mm pancreatic cystic lesion. Favor pseudocyst. Indolent neoplasm may look similar. Attention on follow-up imaging.      09/06/2016 Imaging    Bone Scan: IMPRESSION: In this patient  with known diffuse sclerotic metastatic disease, bone scan does not reveal progression of radiotracer uptake. Several of the areas of previously demonstrated radiotracer uptake appear less prominent.  Change in appearance of radiotracer uptake involving the mandible now more notable on the right and previously more notable on the left may reflect result of dental disease rather than metastatic disease.       04/08/2017 Tumor Marker    PSA 0.89         INTERVAL HISTORY:  [name] 72 y.o. male returns for routine follow-up of metastatic prostate cancer and consideration for next cycle of chemotherapy.  Patient here today doing well tolerating chemo with no complaints. He has little fatigue. He is able to perform all his own ADLs. He continues to work a full time job. Denies any numbness or tingling. Denies any new pains. Denies fevers or infection. Overall, he tells me he has been feeling pretty well. Energy levels 75%; appetite 100%. Overall, he feels ready for next cycle of chemo today.     REVIEW OF SYSTEMS:  Review of Systems  Constitutional: Positive for fatigue.  HENT:  Negative.   Eyes: Negative.   Respiratory: Negative.   Cardiovascular: Negative.   Gastrointestinal: Negative.   Endocrine: Negative.   Genitourinary: Negative.    Musculoskeletal: Negative.   Skin: Negative.   Neurological: Negative.   Hematological: Negative.   Psychiatric/Behavioral: Negative.      PAST MEDICAL/SURGICAL HISTORY:  Past Medical History:  Diagnosis Date  . Acute renal failure ([facility]) 01/26/2014  . Alcohol abuse   . Anemia of chronic disease 01/26/2014  . Bilateral hydronephrosis 01/26/2014  . History of cocaine abuse 01/26/2014  . Homelessness 01/31/2014  . Prostate cancer metastatic to multiple sites ([facility]) 01/30/2014  . Sepsis ([facility])   . UTI (lower urinary tract infection)    Past Surgical History:  Procedure Laterality Date  . ORCHIECTOMY Bilateral 02/05/2014   Procedure: BILATERAL ORCHIECTOMY;  Surgeon: Festus Aloe, MD;  Location: [facility];  Service: Urology;  Laterality: Bilateral;  . PERCUTANEOUS NEPHROSTOMY Bilateral    IR Dr. Junious Silk changed on 04/22/2014  . PORT-A-CATH REMOVAL Right 08/08/2017   Procedure: REMOVAL PORT-A-CATH RIGHT CHEST (PROCEDURE #2);  Surgeon: Aviva Signs, MD;  Location: [facility];  Service: General;  Laterality: Right;  . PORTACATH PLACEMENT Right 03/12/14  . PORTACATH PLACEMENT Left 08/08/2017   Procedure: INSERTION POWER PORT WITH ATTACHED CATHETER LEFT SUBCLAVIAN (PROCEDURE #1);  Surgeon: Aviva Signs, MD;  Location: [facility];  Service: General;  Laterality:  Left;     SOCIAL HISTORY:  Social History   Socioeconomic History  . Marital status: Single    Spouse name: Not on file  . Number of children: 1  . Years of education: 9th  . Highest education level: Not on file  Occupational History  . Occupation: disabilty    Employer: [employer]  . Financial resource strain: Not on file  . Food insecurity:    Worry: Not on file    Inability: Not on file  . Transportation needs:    Medical: Not on file    Non-medical: Not on file  Tobacco Use  . Smoking status: Current Every Day Smoker    Packs/day: 0.25    Years: 55.00    Pack years: 13.75    Types: Cigarettes  . Smokeless tobacco: Never Used  Substance and Sexual Activity  . Alcohol use: Yes    Alcohol/week: 0.6 oz    Types: 1 Cans of beer  per week  . Drug use: Yes    Types: Cocaine    Comment: last used Cocaine 01/25/14  . Sexual activity: Yes  Lifestyle  . Physical activity:    Days per week: Not on file    Minutes per session: Not on file  . Stress: Not on file  Relationships  . Social connections:    Talks on phone: Not on file    Gets together: Not on file    Attends religious service: Not on file    Active member of club or organization: Not on file    Attends meetings of clubs or organizations: Not on file    Relationship status: Not on file  . Intimate partner violence:    Fear of current or ex partner: Not on file    Emotionally abused: Not on file    Physically abused: Not on file    Forced sexual activity: Not on file  Other Topics Concern  . Not on file  Social History Narrative   Lives at [employer] 9th grade   1 son, lives in [place], [place]   Can read and write in native language             FAMILY HISTORY:  Family History  Problem Relation Age of Onset  . Cancer Brother 59    CURRENT MEDICATIONS:  Outpatient Encounter Medications as of 11/02/2017  Medication Sig Note  . HYDROcodone-acetaminophen  (NORCO) 5-325 MG tablet Take 1 tablet by mouth every 6 (six) hours as needed for moderate pain.   [name] Kitchen lidocaine-prilocaine (EMLA) cream Apply a quarter size amount to port site 1 hour prior to chemo. Do not rub in. Cover with plastic wrap. 07/24/2014: Only takes when getting port flushed  . metoCLOPramide (REGLAN) 5 MG tablet The day after chemo take 1 tab four times a day x 2 days. Then may take 1 tab four times a day if needed for nausea/vomiting. 07/24/2014: Just takes as needed  . ondansetron (ZOFRAN) 8 MG tablet Take 1 tablet (8 mg total) by mouth every 8 (eight) hours as needed for nausea or vomiting.   . prochlorperazine (COMPAZINE) 10 MG tablet The day after chemo take 1 tab four times a day x 2 days. Then may take 1 tab four times a day if needed for nausea/vomiting. 07/24/2014: Just takes as needed   Facility-Administered Encounter Medications as of 11/02/2017  Medication  . cyanocobalamin ((VITAMIN B-12)) injection 1,000 mcg  . denosumab (XGEVA) injection 120 mg    ALLERGIES:  No Known Allergies   PHYSICAL EXAM:  ECOG Performance status: 1  VITAL SIGNS: BP: 116/63, Pulse: 57, Resp: 18, Temp: 97.6, 02: 99%  Physical Exam   LABORATORY DATA:  I have reviewed the labs as listed.  CBC    Component Value Date/Time   WBC 6.5 11/02/2017 0950   RBC 3.53 (L) 11/02/2017 0950   HGB 10.7 (L) 11/02/2017 0950   HCT 32.6 (L) 11/02/2017 0950   PLT 388 11/02/2017 0950   MCV 92.4 11/02/2017 0950   MCH 30.3 11/02/2017 0950   MCHC 32.8 11/02/2017 0950   RDW 18.4 (H) 11/02/2017 0950   LYMPHSABS 1.4 11/02/2017 0950   MONOABS 0.7 11/02/2017 0950   EOSABS 0.1 11/02/2017 0950   BASOSABS 0.0 11/02/2017 0950   CMP Latest Ref Rng & Units 11/02/2017 10/11/2017 09/20/2017  Glucose 70 - 99 mg/dL 105(H) 93 96  BUN 8 - 23 mg/dL 23 16 13   Creatinine 0.61 -  1.24 mg/dL 1.81(H) 1.32(H) 1.38(H)  Sodium 135 - 145 mmol/L 137 136 133(L)  Potassium 3.5 - 5.1 mmol/L 4.6 4.9 4.3  Chloride 98 - 111 mmol/L 105  109 102  CO2 22 - 32 mmol/L 23 22 23   Calcium 8.9 - 10.3 mg/dL 9.4 7.6(L) 8.4(L)  Total Protein 6.5 - 8.1 g/dL 8.1 7.2 7.7  Total Bilirubin 0.3 - 1.2 mg/dL 0.3 <0.1(L) 0.4  Alkaline Phos 38 - 126 U/L 63 55 58  AST 15 - 41 U/L 32 25 22  ALT 0 - 44 U/L 22 18 13(L)        ASSESSMENT & PLAN:   Malignant neoplasm of prostate ([facility]) 1.  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 -Last bone scan on 07/25/2017 showed mixed response compared to prior scan on 09/06/2016.  Last CT scan was on 12/20/2016 which did not show any visceral metastasis but showed pulmonary fibrotic changes. - His PSA on 10/11/2017 continue to improve and was 0.69.  He is tolerating cabazitaxel very well without any problems.  He is continuing to work.  Denies any tingling or numbness in extremities.  Denies any GI symptoms.  He will proceed with his next cycle today and in 3 weeks.  I will see him back in 6 weeks for follow-up.  I will do scans only if PSA trends up.  2.  Bone metastasis: - He will continue denosumab injections as he is tolerating them very well.  3.  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  However his urine drug screen was positive for cocaine twice.  Hence I did not give any prescription pain medication.      Orders placed this encounter:  Orders Placed This Encounter  Procedures  . Magnesium  . CBC with Differential/Platelet  . Comprehensive metabolic panel  . PSA  . Magnesium  . CBC with Differential/Platelet  . Comprehensive metabolic panel      Derek Jack, MD [place] [phone]

## 2017-11-23 ENCOUNTER — Inpatient Hospital Stay (HOSPITAL_COMMUNITY): Payer: Medicare Other

## 2017-11-23 ENCOUNTER — Other Ambulatory Visit: Payer: Self-pay

## 2017-11-23 VITALS — BP 109/73 | HR 56 | Temp 97.6°F | Resp 18 | Wt 160.6 lb

## 2017-11-23 DIAGNOSIS — C61 Malignant neoplasm of prostate: Secondary | ICD-10-CM

## 2017-11-23 DIAGNOSIS — Z5111 Encounter for antineoplastic chemotherapy: Secondary | ICD-10-CM | POA: Diagnosis not present

## 2017-11-23 LAB — CBC WITH DIFFERENTIAL/PLATELET
BASOS ABS: 0 10*3/uL (ref 0.0–0.1)
BASOS PCT: 0 %
EOS ABS: 0.1 10*3/uL (ref 0.0–0.7)
Eosinophils Relative: 2 %
HCT: 31.8 % — ABNORMAL LOW (ref 39.0–52.0)
HEMOGLOBIN: 10.3 g/dL — AB (ref 13.0–17.0)
Lymphocytes Relative: 26 %
Lymphs Abs: 1.7 10*3/uL (ref 0.7–4.0)
MCH: 30.5 pg (ref 26.0–34.0)
MCHC: 32.4 g/dL (ref 30.0–36.0)
MCV: 94.1 fL (ref 78.0–100.0)
MONOS PCT: 11 %
Monocytes Absolute: 0.7 10*3/uL (ref 0.1–1.0)
NEUTROS PCT: 61 %
Neutro Abs: 4 10*3/uL (ref 1.7–7.7)
Platelets: 269 10*3/uL (ref 150–400)
RBC: 3.38 MIL/uL — ABNORMAL LOW (ref 4.22–5.81)
RDW: 17.3 % — ABNORMAL HIGH (ref 11.5–15.5)
WBC: 6.5 10*3/uL (ref 4.0–10.5)

## 2017-11-23 LAB — COMPREHENSIVE METABOLIC PANEL
ALBUMIN: 3.8 g/dL (ref 3.5–5.0)
ALK PHOS: 55 U/L (ref 38–126)
ALT: 17 U/L (ref 0–44)
AST: 27 U/L (ref 15–41)
Anion gap: 7 (ref 5–15)
BUN: 28 mg/dL — AB (ref 8–23)
CALCIUM: 8.5 mg/dL — AB (ref 8.9–10.3)
CHLORIDE: 109 mmol/L (ref 98–111)
CO2: 21 mmol/L — AB (ref 22–32)
CREATININE: 1.73 mg/dL — AB (ref 0.61–1.24)
GFR calc non Af Amer: 38 mL/min — ABNORMAL LOW (ref >60)
GFR, EST AFRICAN AMERICAN: 44 mL/min — AB (ref >60)
GLUCOSE: 119 mg/dL — AB (ref 70–99)
Potassium: 4.5 mmol/L (ref 3.5–5.1)
SODIUM: 137 mmol/L (ref 135–145)
Total Bilirubin: 0.3 mg/dL (ref 0.3–1.2)
Total Protein: 7.5 g/dL (ref 6.5–8.1)

## 2017-11-23 LAB — MAGNESIUM: MAGNESIUM: 2 mg/dL (ref 1.7–2.4)

## 2017-11-23 LAB — PSA: Prostatic Specific Antigen: 0.59 ng/mL (ref 0.00–4.00)

## 2017-11-23 MED ORDER — FAMOTIDINE IN NACL 20-0.9 MG/50ML-% IV SOLN
INTRAVENOUS | Status: AC
  Filled 2017-11-23: qty 50

## 2017-11-23 MED ORDER — DEXAMETHASONE SODIUM PHOSPHATE 10 MG/ML IJ SOLN
INTRAMUSCULAR | Status: AC
  Filled 2017-11-23: qty 1

## 2017-11-23 MED ORDER — PEGFILGRASTIM 6 MG/0.6ML ~~LOC~~ PSKT
6.0000 mg | PREFILLED_SYRINGE | Freq: Once | SUBCUTANEOUS | Status: AC
  Administered 2017-11-23: 6 mg via SUBCUTANEOUS
  Filled 2017-11-23: qty 0.6

## 2017-11-23 MED ORDER — DEXAMETHASONE SODIUM PHOSPHATE 10 MG/ML IJ SOLN
10.0000 mg | Freq: Once | INTRAMUSCULAR | Status: AC
  Administered 2017-11-23: 10 mg via INTRAVENOUS

## 2017-11-23 MED ORDER — DEXTROSE 5 % IV SOLN
20.0000 mg/m2 | Freq: Once | INTRAVENOUS | Status: AC
  Administered 2017-11-23: 41 mg via INTRAVENOUS
  Filled 2017-11-23: qty 4.1

## 2017-11-23 MED ORDER — FAMOTIDINE IN NACL 20-0.9 MG/50ML-% IV SOLN
20.0000 mg | Freq: Once | INTRAVENOUS | Status: AC
  Administered 2017-11-23: 20 mg via INTRAVENOUS

## 2017-11-23 MED ORDER — DIPHENHYDRAMINE HCL 50 MG/ML IJ SOLN
25.0000 mg | Freq: Once | INTRAMUSCULAR | Status: AC
  Administered 2017-11-23: 25 mg via INTRAVENOUS

## 2017-11-23 MED ORDER — HEPARIN SOD (PORK) LOCK FLUSH 100 UNIT/ML IV SOLN
500.0000 [IU] | Freq: Once | INTRAVENOUS | Status: AC | PRN
  Administered 2017-11-23: 500 [IU]
  Filled 2017-11-23: qty 5

## 2017-11-23 MED ORDER — SODIUM CHLORIDE 0.9 % IV SOLN
Freq: Once | INTRAVENOUS | Status: AC
  Administered 2017-11-23: 09:00:00 via INTRAVENOUS

## 2017-11-23 MED ORDER — DIPHENHYDRAMINE HCL 50 MG/ML IJ SOLN
INTRAMUSCULAR | Status: AC
  Filled 2017-11-23: qty 1

## 2017-11-23 NOTE — Progress Notes (Signed)
Tolerated infusion w/o adverse reaction.  Alert, in no distress.  VSS.  Discharged ambulatory.  

## 2017-11-28 ENCOUNTER — Other Ambulatory Visit (HOSPITAL_COMMUNITY): Payer: Self-pay

## 2017-11-28 DIAGNOSIS — C61 Malignant neoplasm of prostate: Secondary | ICD-10-CM

## 2017-11-28 DIAGNOSIS — M899 Disorder of bone, unspecified: Secondary | ICD-10-CM

## 2017-11-30 ENCOUNTER — Inpatient Hospital Stay (HOSPITAL_COMMUNITY): Payer: Medicare Other | Attending: Internal Medicine

## 2017-11-30 ENCOUNTER — Encounter (HOSPITAL_COMMUNITY): Payer: Self-pay

## 2017-11-30 ENCOUNTER — Inpatient Hospital Stay (HOSPITAL_COMMUNITY): Payer: Medicare Other

## 2017-11-30 ENCOUNTER — Other Ambulatory Visit: Payer: Self-pay

## 2017-11-30 VITALS — BP 98/57 | HR 80 | Temp 97.7°F | Resp 18 | Wt 159.9 lb

## 2017-11-30 DIAGNOSIS — C7951 Secondary malignant neoplasm of bone: Secondary | ICD-10-CM | POA: Diagnosis not present

## 2017-11-30 DIAGNOSIS — C61 Malignant neoplasm of prostate: Secondary | ICD-10-CM | POA: Diagnosis present

## 2017-11-30 DIAGNOSIS — Z5111 Encounter for antineoplastic chemotherapy: Secondary | ICD-10-CM | POA: Diagnosis not present

## 2017-11-30 DIAGNOSIS — E538 Deficiency of other specified B group vitamins: Secondary | ICD-10-CM

## 2017-11-30 DIAGNOSIS — M899 Disorder of bone, unspecified: Secondary | ICD-10-CM

## 2017-11-30 DIAGNOSIS — Z5189 Encounter for other specified aftercare: Secondary | ICD-10-CM | POA: Diagnosis not present

## 2017-11-30 LAB — COMPREHENSIVE METABOLIC PANEL
ALT: 15 U/L (ref 0–44)
ANION GAP: 5 (ref 5–15)
AST: 23 U/L (ref 15–41)
Albumin: 3.9 g/dL (ref 3.5–5.0)
Alkaline Phosphatase: 114 U/L (ref 38–126)
BILIRUBIN TOTAL: 0.6 mg/dL (ref 0.3–1.2)
BUN: 17 mg/dL (ref 8–23)
CHLORIDE: 106 mmol/L (ref 98–111)
CO2: 23 mmol/L (ref 22–32)
Calcium: 8.3 mg/dL — ABNORMAL LOW (ref 8.9–10.3)
Creatinine, Ser: 1.68 mg/dL — ABNORMAL HIGH (ref 0.61–1.24)
GFR calc Af Amer: 46 mL/min — ABNORMAL LOW (ref >60)
GFR, EST NON AFRICAN AMERICAN: 40 mL/min — AB (ref >60)
Glucose, Bld: 86 mg/dL (ref 70–99)
POTASSIUM: 5.4 mmol/L — AB (ref 3.5–5.1)
Sodium: 134 mmol/L — ABNORMAL LOW (ref 135–145)
TOTAL PROTEIN: 7.6 g/dL (ref 6.5–8.1)

## 2017-11-30 MED ORDER — CYANOCOBALAMIN 1000 MCG/ML IJ SOLN
1000.0000 ug | Freq: Once | INTRAMUSCULAR | Status: AC
  Administered 2017-11-30: 1000 ug via INTRAMUSCULAR

## 2017-11-30 MED ORDER — DENOSUMAB 120 MG/1.7ML ~~LOC~~ SOLN
120.0000 mg | Freq: Once | SUBCUTANEOUS | Status: AC
  Administered 2017-11-30: 120 mg via SUBCUTANEOUS
  Filled 2017-11-30: qty 1.7

## 2017-11-30 MED ORDER — CYANOCOBALAMIN 1000 MCG/ML IJ SOLN
INTRAMUSCULAR | Status: AC
  Filled 2017-11-30: qty 1

## 2017-11-30 NOTE — Progress Notes (Signed)
Pt here today for Xgeva and B12 injections. PT's calcium today is 8.3 and albumin is 3.9. Pt's corrected calcium is 8.38. Pt states that he is taking Calcium 500mg  BID at home. I spoke with Dr. Delton Coombes and he advised ok to give Xgeva. Pt given B12 injection in left deltoid, given xgeva in left upper abdomen. Pt tolerated injections well. Pt stable and discharged home ambulatory. Pt to return as scheduled.

## 2017-12-14 ENCOUNTER — Inpatient Hospital Stay (HOSPITAL_BASED_OUTPATIENT_CLINIC_OR_DEPARTMENT_OTHER): Payer: Medicare Other | Admitting: Hematology

## 2017-12-14 ENCOUNTER — Encounter (HOSPITAL_COMMUNITY): Payer: Self-pay | Admitting: Hematology

## 2017-12-14 ENCOUNTER — Encounter (HOSPITAL_COMMUNITY): Payer: Self-pay

## 2017-12-14 ENCOUNTER — Inpatient Hospital Stay (HOSPITAL_COMMUNITY): Payer: Medicare Other

## 2017-12-14 ENCOUNTER — Ambulatory Visit (HOSPITAL_COMMUNITY): Payer: Medicare Other | Admitting: Hematology

## 2017-12-14 VITALS — BP 110/67 | HR 64 | Temp 97.6°F | Resp 18 | Wt 163.0 lb

## 2017-12-14 VITALS — BP 107/76 | HR 59 | Temp 97.8°F | Resp 18

## 2017-12-14 DIAGNOSIS — C7951 Secondary malignant neoplasm of bone: Secondary | ICD-10-CM | POA: Diagnosis not present

## 2017-12-14 DIAGNOSIS — C61 Malignant neoplasm of prostate: Secondary | ICD-10-CM | POA: Diagnosis not present

## 2017-12-14 DIAGNOSIS — R7989 Other specified abnormal findings of blood chemistry: Secondary | ICD-10-CM

## 2017-12-14 DIAGNOSIS — Z5111 Encounter for antineoplastic chemotherapy: Secondary | ICD-10-CM | POA: Diagnosis not present

## 2017-12-14 LAB — CBC WITH DIFFERENTIAL/PLATELET
Basophils Absolute: 0 10*3/uL (ref 0.0–0.1)
Basophils Relative: 0 %
EOS PCT: 1 %
Eosinophils Absolute: 0.1 10*3/uL (ref 0.0–0.7)
HEMATOCRIT: 33.2 % — AB (ref 39.0–52.0)
Hemoglobin: 10.8 g/dL — ABNORMAL LOW (ref 13.0–17.0)
LYMPHS PCT: 24 %
Lymphs Abs: 1.6 10*3/uL (ref 0.7–4.0)
MCH: 31.2 pg (ref 26.0–34.0)
MCHC: 32.5 g/dL (ref 30.0–36.0)
MCV: 96 fL (ref 78.0–100.0)
MONO ABS: 0.7 10*3/uL (ref 0.1–1.0)
MONOS PCT: 10 %
NEUTROS ABS: 4.5 10*3/uL (ref 1.7–7.7)
Neutrophils Relative %: 65 %
PLATELETS: 287 10*3/uL (ref 150–400)
RBC: 3.46 MIL/uL — ABNORMAL LOW (ref 4.22–5.81)
RDW: 16.8 % — AB (ref 11.5–15.5)
WBC: 6.9 10*3/uL (ref 4.0–10.5)

## 2017-12-14 LAB — MAGNESIUM: Magnesium: 2.2 mg/dL (ref 1.7–2.4)

## 2017-12-14 LAB — COMPREHENSIVE METABOLIC PANEL
ALT: 20 U/L (ref 0–44)
ANION GAP: 7 (ref 5–15)
AST: 26 U/L (ref 15–41)
Albumin: 3.9 g/dL (ref 3.5–5.0)
Alkaline Phosphatase: 60 U/L (ref 38–126)
BILIRUBIN TOTAL: 0.5 mg/dL (ref 0.3–1.2)
BUN: 22 mg/dL (ref 8–23)
CO2: 23 mmol/L (ref 22–32)
Calcium: 8.4 mg/dL — ABNORMAL LOW (ref 8.9–10.3)
Chloride: 107 mmol/L (ref 98–111)
Creatinine, Ser: 2.04 mg/dL — ABNORMAL HIGH (ref 0.61–1.24)
GFR, EST AFRICAN AMERICAN: 36 mL/min — AB (ref >60)
GFR, EST NON AFRICAN AMERICAN: 31 mL/min — AB (ref >60)
Glucose, Bld: 78 mg/dL (ref 70–99)
POTASSIUM: 4.9 mmol/L (ref 3.5–5.1)
Sodium: 137 mmol/L (ref 135–145)
TOTAL PROTEIN: 7.6 g/dL (ref 6.5–8.1)

## 2017-12-14 MED ORDER — SODIUM CHLORIDE 0.9 % IV SOLN
Freq: Once | INTRAVENOUS | Status: AC
  Administered 2017-12-14: 10:00:00 via INTRAVENOUS

## 2017-12-14 MED ORDER — PEGFILGRASTIM 6 MG/0.6ML ~~LOC~~ PSKT
6.0000 mg | PREFILLED_SYRINGE | Freq: Once | SUBCUTANEOUS | Status: AC
  Administered 2017-12-14: 6 mg via SUBCUTANEOUS
  Filled 2017-12-14: qty 0.6

## 2017-12-14 MED ORDER — DIPHENHYDRAMINE HCL 50 MG/ML IJ SOLN
25.0000 mg | Freq: Once | INTRAMUSCULAR | Status: AC
  Administered 2017-12-14: 25 mg via INTRAVENOUS
  Filled 2017-12-14: qty 1

## 2017-12-14 MED ORDER — HEPARIN SOD (PORK) LOCK FLUSH 100 UNIT/ML IV SOLN
500.0000 [IU] | Freq: Once | INTRAVENOUS | Status: AC | PRN
  Administered 2017-12-14: 500 [IU]
  Filled 2017-12-14 (×2): qty 5

## 2017-12-14 MED ORDER — DEXAMETHASONE SODIUM PHOSPHATE 10 MG/ML IJ SOLN
10.0000 mg | Freq: Once | INTRAMUSCULAR | Status: AC
  Administered 2017-12-14: 10 mg via INTRAVENOUS
  Filled 2017-12-14: qty 1

## 2017-12-14 MED ORDER — SODIUM CHLORIDE 0.9% FLUSH
10.0000 mL | INTRAVENOUS | Status: DC | PRN
  Administered 2017-12-14: 10 mL
  Filled 2017-12-14: qty 10

## 2017-12-14 MED ORDER — SODIUM CHLORIDE 0.9 % IV SOLN
Freq: Once | INTRAVENOUS | Status: AC
  Administered 2017-12-14: 500 mL via INTRAVENOUS

## 2017-12-14 MED ORDER — FAMOTIDINE IN NACL 20-0.9 MG/50ML-% IV SOLN
20.0000 mg | Freq: Once | INTRAVENOUS | Status: AC
  Administered 2017-12-14: 20 mg via INTRAVENOUS
  Filled 2017-12-14: qty 50

## 2017-12-14 MED ORDER — DEXTROSE 5 % IV SOLN
20.0000 mg/m2 | Freq: Once | INTRAVENOUS | Status: AC
  Administered 2017-12-14: 41 mg via INTRAVENOUS
  Filled 2017-12-14: qty 4.1

## 2017-12-14 NOTE — Progress Notes (Signed)
Jeff [name], [place] [address]   CLINIC:  Medical Oncology/Hematology  PCP:  Holley Bouche, NP No address on file None   REASON FOR VISIT:  Follow-up for metastatic prostate cancer  CURRENT THERAPY: Cabazitaxel every 3 weeks  BRIEF ONCOLOGIC HISTORY:    Malignant neoplasm of prostate ([facility])   01/26/2014 Tumor Marker    PSA > 5000    01/28/2014 Initial Biopsy    Metastatic adenocarcinoma of prostate    02/05/2014 Surgery    Bilateral orchiectomy by Dr. Junious Silk    02/26/2014 Tumor Marker    PSA = 399.5    03/14/2014 Tumor Marker    PSA= 89.51    03/14/2014 - 06/26/2014 Chemotherapy    Docetaxel 75 mg/kg every 21 days x 6 cycles    04/24/2014 Tumor Marker    PSA- 19.89    07/23/2014 Procedure    Nephrostomy tube removed by IR, Dr. Geroge Baseman    07/24/2014 Tumor Marker    PSA= 6.15    10/16/2014 Tumor Marker    PSA: 3.54     01/08/2015 Tumor Marker    PSA: 2.13     01/17/2015 Imaging    CT CAP-  Massive pelvic and retroperitoneal lymphadenopathy noted on the prior study has nearly completely resolved, now with only a small amount of residual amorphous soft tissue predominantly around the the infrarenal abdominal aorta.    01/17/2015 Imaging    Bone scan- Widespread osseous metastatic disease with multiple foci of increased activity throughout the skeleton, corresponding with the findings on the prior CT from October, 2015.    04/11/2015 Tumor Marker    PSA: 1.87     07/21/2015 Imaging    Bone scan- Bony metastatic disease again noted at multiple sites, stable from prior study. No progression of bony metastatic disease is demonstrable on this study.    07/25/2015 Imaging    CT CAP- Stable matted soft tissue density in the retroperitoneum and extraperitoneal pelvis consistent with treated disease. No recurrent lymphadenopathy.  No pulmonary metastatic disease. Diffuse stable sclerotic metastatic bone disease.    01/14/2016  Imaging    Bone scan-  Multiple sites of abnormal increased tracer localization involving BILATERAL ribs, T11, T12, questionably RIGHT scapula and LEFT iliac bone suspicious for osseous metastases.    01/23/2016 Imaging    CT CAP- 1. Similar widespread osseous metastasis. 2. Similar soft tissue thickening within the retroperitoneum of the abdomen. Improved left pelvic side wall soft tissue thickening. These are consistent with sites of treated disease. No well-defined adenopathy. 3. No new sites of disease.    05/03/2016 -  Chemotherapy    Jevtana every 21 days     09/06/2016 Imaging    Restaging CT C/A/P: IMPRESSION: 1. Similar appearance of widespread sclerotic bone metastases. 2. No change in soft tissue thickening within the retroperitoneum and left pelvic sidewall. No well defined adenopathy identified. 3. No new sites of disease 4. Aortic Atherosclerosis (ICD10-I70.0). Coronary artery calcifications noted. 5. 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 6. Stable 9 mm pancreatic cystic lesion. Favor pseudocyst. Indolent neoplasm may look similar. Attention on follow-up imaging.    09/06/2016 Imaging    Bone Scan: IMPRESSION: In this patient with known diffuse sclerotic metastatic disease, bone scan does not reveal progression of radiotracer uptake. Several of the areas of previously demonstrated radiotracer uptake appear less prominent.  Change in appearance of radiotracer uptake involving the mandible now more notable on  the right and previously more notable on the left may reflect result of dental disease rather than metastatic disease.     04/08/2017 Tumor Marker    PSA 0.89        INTERVAL HISTORY:  [name] 72 y.o. male returns for routine follow-up for metastatic prostate cancer. Patient is here today doing well with his treatment. He has no complains of any side effects from his medication.  Patient appetite is 100% and his energy levels are 75%. Patient stays active mowing grass and always up walking and moving around. Patient denies any nausea, vomiting, or diarrhea. Denies any new pains. Denies any mouth sores. Denies any bleeding. Overall he feels ready for his next treatment cycle.     REVIEW OF SYSTEMS:  Review of Systems  All other systems reviewed and are negative.    PAST MEDICAL/SURGICAL HISTORY:  Past Medical History:  Diagnosis Date  . Acute renal failure ([facility]) 01/26/2014  . Alcohol abuse   . Anemia of chronic disease 01/26/2014  . Bilateral hydronephrosis 01/26/2014  . History of cocaine abuse 01/26/2014  . Homelessness 01/31/2014  . Prostate cancer metastatic to multiple sites ([facility]) 01/30/2014  . Sepsis ([facility])   . UTI (lower urinary tract infection)    Past Surgical History:  Procedure Laterality Date  . ORCHIECTOMY Bilateral 02/05/2014   Procedure: BILATERAL ORCHIECTOMY;  Surgeon: Festus Aloe, MD;  Location: [facility];  Service: Urology;  Laterality: Bilateral;  . PERCUTANEOUS NEPHROSTOMY Bilateral    IR Dr. Junious Silk changed on 04/22/2014  . PORT-A-CATH REMOVAL Right 08/08/2017   Procedure: REMOVAL PORT-A-CATH RIGHT CHEST (PROCEDURE #2);  Surgeon: Aviva Signs, MD;  Location: [facility];  Service: General;  Laterality: Right;  . PORTACATH PLACEMENT Right 03/12/14  . PORTACATH PLACEMENT Left 08/08/2017   Procedure: INSERTION POWER PORT WITH ATTACHED CATHETER LEFT SUBCLAVIAN (PROCEDURE #1);  Surgeon: Aviva Signs, MD;  Location: [facility];  Service: General;  Laterality: Left;     SOCIAL HISTORY:  Social History   Socioeconomic History  . Marital status: Single    Spouse name: Not on file  . Number of children: 1  . Years of education: 9th  . Highest education level: Not on file  Occupational History  . Occupation: disabilty    Employer: [employer]  . Financial resource strain: Not on file  . Food insecurity:    Worry: Not on file     Inability: Not on file  . Transportation needs:    Medical: Not on file    Non-medical: Not on file  Tobacco Use  . Smoking status: Current Every Day Smoker    Packs/day: 0.25    Years: 55.00    Pack years: 13.75    Types: Cigarettes  . Smokeless tobacco: Never Used  Substance and Sexual Activity  . Alcohol use: Yes    Alcohol/week: 1.0 standard drinks    Types: 1 Cans of beer per week  . Drug use: Yes    Types: Cocaine    Comment: last used Cocaine 01/25/14  . Sexual activity: Yes  Lifestyle  . Physical activity:    Days per week: Not on file    Minutes per session: Not on file  . Stress: Not on file  Relationships  . Social connections:    Talks on phone: Not on file    Gets together: Not on file    Attends religious service: Not on file    Active member of club or organization: Not on  file    Attends meetings of clubs or organizations: Not on file    Relationship status: Not on file  . Intimate partner violence:    Fear of current or ex partner: Not on file    Emotionally abused: Not on file    Physically abused: Not on file    Forced sexual activity: Not on file  Other Topics Concern  . Not on file  Social History Narrative   Lives at [employer] 9th grade   1 son, lives in [place], [place]   Can read and write in native language             FAMILY HISTORY:  Family History  Problem Relation Age of Onset  . Cancer Brother 23    CURRENT MEDICATIONS:  Outpatient Encounter Medications as of 12/14/2017  Medication Sig Note  . calcium gluconate 500 MG tablet Take 1 tablet by mouth 2 (two) times daily.   [name] Kitchen lidocaine-prilocaine (EMLA) cream Apply a quarter size amount to port site 1 hour prior to chemo. Do not rub in. Cover with plastic wrap. 07/24/2014: Only takes when getting port flushed  . metoCLOPramide (REGLAN) 5 MG tablet The day after chemo take 1 tab four times a day x 2 days. Then may take 1 tab four times a day if needed for  nausea/vomiting. 07/24/2014: Just takes as needed  . prochlorperazine (COMPAZINE) 10 MG tablet The day after chemo take 1 tab four times a day x 2 days. Then may take 1 tab four times a day if needed for nausea/vomiting. 07/24/2014: Just takes as needed   Facility-Administered Encounter Medications as of 12/14/2017  Medication  . [COMPLETED] 0.9 %  sodium chloride infusion  . [COMPLETED] 0.9 %  sodium chloride infusion  . [COMPLETED] cabazitaxel (JEVTANA) 41 mg in dextrose 5 % 250 mL chemo infusion  . cyanocobalamin ((VITAMIN B-12)) injection 1,000 mcg  . denosumab (XGEVA) injection 120 mg  . [COMPLETED] dexamethasone (DECADRON) injection 10 mg  . [COMPLETED] diphenhydrAMINE (BENADRYL) injection 25 mg  . [COMPLETED] famotidine (PEPCID) IVPB 20 mg premix  . [COMPLETED] heparin lock flush 100 unit/mL  . [COMPLETED] pegfilgrastim (NEULASTA ONPRO KIT) injection 6 mg  . [DISCONTINUED] sodium chloride flush (NS) 0.9 % injection 10 mL    ALLERGIES:  No Known Allergies   PHYSICAL EXAM:  ECOG Performance status: 1  Vitals:   12/14/17 0850  BP: 110/67  Pulse: 64  Resp: 18  Temp: 97.6 F (36.4 C)  SpO2: 100%   Filed Weights   12/14/17 0850  Weight: 163 lb (73.9 kg)    Physical Exam  Constitutional: He is oriented to person, place, and time. He appears well-developed and well-nourished.  Cardiovascular: Normal rate, regular rhythm and normal heart sounds.  Pulmonary/Chest: Effort normal and breath sounds normal.  Neurological: He is alert and oriented to person, place, and time.  Skin: Skin is warm and dry.     LABORATORY DATA:  I have reviewed the labs as listed.  CBC    Component Value Date/Time   WBC 6.9 12/14/2017 0831   RBC 3.46 (L) 12/14/2017 0831   HGB 10.8 (L) 12/14/2017 0831   HCT 33.2 (L) 12/14/2017 0831   PLT 287 12/14/2017 0831   MCV 96.0 12/14/2017 0831   MCH 31.2 12/14/2017 0831   MCHC 32.5 12/14/2017 0831   RDW 16.8 (H) 12/14/2017 0831   LYMPHSABS 1.6  12/14/2017 0831   MONOABS 0.7 12/14/2017 0831   EOSABS 0.1  12/14/2017 0831   BASOSABS 0.0 12/14/2017 0831   CMP Latest Ref Rng & Units 12/14/2017 11/30/2017 11/23/2017  Glucose 70 - 99 mg/dL 78 86 119(H)  BUN 8 - 23 mg/dL 22 17 28(H)  Creatinine 0.61 - 1.24 mg/dL 2.04(H) 1.68(H) 1.73(H)  Sodium 135 - 145 mmol/L 137 134(L) 137  Potassium 3.5 - 5.1 mmol/L 4.9 5.4(H) 4.5  Chloride 98 - 111 mmol/L 107 106 109  CO2 22 - 32 mmol/L 23 23 21(L)  Calcium 8.9 - 10.3 mg/dL 8.4(L) 8.3(L) 8.5(L)  Total Protein 6.5 - 8.1 g/dL 7.6 7.6 7.5  Total Bilirubin 0.3 - 1.2 mg/dL 0.5 0.6 0.3  Alkaline Phos 38 - 126 U/L 60 114 55  AST 15 - 41 U/L '26 23 27  ' ALT 0 - 44 U/L '20 15 17          ' ASSESSMENT & PLAN:   Malignant neoplasm of prostate ([facility]CC) 1.  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 -Last bone scan on 07/25/2017 showed mixed response compared to prior scan on 09/06/2016.  Last CT scan was on 12/20/2016 which did not show any visceral metastasis but showed pulmonary fibrotic changes. - Cycle 22 of chemo was on 11/23/2017.  He is tolerating it extremely well.  No sign of neuropathy.  His last PSA has come down to 0.59 on 11/23/2017.  We will consider doing scans only if there is worsening of PSA. -Today he will proceed with his next cycle.  I will see him back in 6 weeks for follow-up.  2.  Bone metastasis: - He will continue denosumab injections as he is tolerating them very well.  3.  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  However his urine drug screen was positive for cocaine twice.  Hence I did not give any prescription pain medication.  4.  Elevated creatinine: - His creatinine is elevated at 2.06 today.  I will give him 1 L of normal saline.  We will keep a close eye on it.   cabazitaxel should not cause elevated creatinine.      Orders placed this encounter:  Orders Placed This Encounter  Procedures  . CBC with Differential/Platelet  . Comprehensive metabolic panel  . Lactate dehydrogenase  . PSA  . CBC with Differential/Platelet  . Comprehensive metabolic panel  . Lactate dehydrogenase      Derek Jack, MD [place] [phone]

## 2017-12-14 NOTE — Assessment & Plan Note (Signed)
1.  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 -Last bone scan on 07/25/2017 showed mixed response compared to prior scan on 09/06/2016.  Last CT scan was on 12/20/2016 which did not show any visceral metastasis but showed pulmonary fibrotic changes. - Cycle 22 of chemo was on 11/23/2017.  He is tolerating it extremely well.  No sign of neuropathy.  His last PSA has come down to 0.59 on 11/23/2017.  We will consider doing scans only if there is worsening of PSA. -Today he will proceed with his next cycle.  I will see him back in 6 weeks for follow-up.  2.  Bone metastasis: - He will continue denosumab injections as he is tolerating them very well.  3.  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  However his urine drug screen was positive for cocaine twice.  Hence I did not give any prescription pain medication.  4.  Elevated creatinine: - His creatinine is elevated at 2.06 today.  I will give him 1 L of normal saline.  We will keep a close eye on it.  cabazitaxel should not cause elevated creatinine.

## 2017-12-14 NOTE — Patient Instructions (Signed)
[facility] Discharge Instructions for Patients Receiving Chemotherapy  Today you received the following chemotherapy agents jevtana and Neulasto Onpro.    If you develop nausea and vomiting that is not controlled by your nausea medication, call the clinic.   BELOW ARE SYMPTOMS THAT SHOULD BE REPORTED IMMEDIATELY:  *FEVER GREATER THAN 100.5 F  *CHILLS WITH OR WITHOUT FEVER  NAUSEA AND VOMITING THAT IS NOT CONTROLLED WITH YOUR NAUSEA MEDICATION  *UNUSUAL SHORTNESS OF BREATH  *UNUSUAL BRUISING OR BLEEDING  TENDERNESS IN MOUTH AND THROAT WITH OR WITHOUT PRESENCE OF ULCERS  *URINARY PROBLEMS  *BOWEL PROBLEMS  UNUSUAL RASH Items with * indicate a potential emergency and should be followed up as soon as possible.  Feel free to call the clinic should you have any questions or concerns. The clinic phone number is [phone].  Please show the [facility] at check-in to the Emergency Department and triage nurse.

## 2017-12-14 NOTE — Progress Notes (Signed)
Patient seen by Dr. Delton Coombes with lab review.  Serum Creatinine 2.04 today.  Ok to treat today verbal order Dr. Delton Coombes.    After review with pharmacy and Dr. Delton Coombes the patient is to receive NACL 107ml for hydration.   Patient tolerated chemotherapy and hydration with no complaints voiced.  Port site clean and dry with good blood return noted before and after administration of therapy.  No bruising or swelling noted at port site.  Band aid applied.  Neulasta Onpro applied with green indicator light flashing and no alarms.  VSS with discharge and left ambulatory with no s/s of distress noted.

## 2017-12-14 NOTE — Patient Instructions (Signed)
[place] at [facility] Discharge Instructions  Follow up in 6 weeks with labs and treatment. Continue your treatment every 3 weeks.    Thank you for choosing [place] at [facility] to provide your oncology and hematology care.  To afford each patient quality time with our provider, please arrive at least 15 minutes before your scheduled appointment time.   If you have a lab appointment with the [facility] please come in thru the  Main Entrance and check in at the main information desk  You need to re-schedule your appointment should you arrive 10 or more minutes late.  We strive to give you quality time with our providers, and arriving late affects you and other patients whose appointments are after yours.  Also, if you no show three or more times for appointments you may be dismissed from the clinic at the providers discretion.     Again, thank you for choosing [facility].  Our hope is that these requests will decrease the amount of time that you wait before being seen by our physicians.       _____________________________________________________________  Should you have questions after your visit to [facility], please contact our office at [phone] between the hours of 8:00 a.m. and 4:30 p.m.  Voicemails left after 4:00 p.m. will not be returned until the following business day.  For prescription refill requests, have your pharmacy contact our office and allow 72 hours.    Cancer Center Support Programs:   > Cancer Support Group  2nd Tuesday of the month 1pm-2pm, Journey Room

## 2017-12-16 MED ORDER — OCTREOTIDE ACETATE 30 MG IM KIT
PACK | INTRAMUSCULAR | Status: AC
  Filled 2017-12-16: qty 1

## 2018-01-05 ENCOUNTER — Ambulatory Visit (HOSPITAL_COMMUNITY): Payer: Medicare Other

## 2018-01-05 ENCOUNTER — Other Ambulatory Visit (HOSPITAL_COMMUNITY): Payer: Medicare Other

## 2018-01-09 ENCOUNTER — Inpatient Hospital Stay (HOSPITAL_COMMUNITY): Payer: Medicare Other | Attending: Hematology

## 2018-01-09 ENCOUNTER — Other Ambulatory Visit: Payer: Self-pay

## 2018-01-09 ENCOUNTER — Encounter (HOSPITAL_COMMUNITY): Payer: Self-pay

## 2018-01-09 ENCOUNTER — Inpatient Hospital Stay (HOSPITAL_COMMUNITY): Payer: Medicare Other

## 2018-01-09 VITALS — BP 107/58 | HR 51 | Temp 97.5°F | Resp 18 | Wt 165.6 lb

## 2018-01-09 DIAGNOSIS — C61 Malignant neoplasm of prostate: Secondary | ICD-10-CM | POA: Diagnosis present

## 2018-01-09 DIAGNOSIS — C7951 Secondary malignant neoplasm of bone: Secondary | ICD-10-CM | POA: Insufficient documentation

## 2018-01-09 DIAGNOSIS — M899 Disorder of bone, unspecified: Secondary | ICD-10-CM

## 2018-01-09 DIAGNOSIS — Z5111 Encounter for antineoplastic chemotherapy: Secondary | ICD-10-CM | POA: Insufficient documentation

## 2018-01-09 DIAGNOSIS — E538 Deficiency of other specified B group vitamins: Secondary | ICD-10-CM

## 2018-01-09 DIAGNOSIS — Z5189 Encounter for other specified aftercare: Secondary | ICD-10-CM | POA: Diagnosis not present

## 2018-01-09 LAB — COMPREHENSIVE METABOLIC PANEL
ALK PHOS: 44 U/L (ref 38–126)
ALT: 12 U/L (ref 0–44)
AST: 21 U/L (ref 15–41)
Albumin: 3.5 g/dL (ref 3.5–5.0)
Anion gap: 5 (ref 5–15)
BILIRUBIN TOTAL: 0.5 mg/dL (ref 0.3–1.2)
BUN: 14 mg/dL (ref 8–23)
CO2: 25 mmol/L (ref 22–32)
Calcium: 8.5 mg/dL — ABNORMAL LOW (ref 8.9–10.3)
Chloride: 109 mmol/L (ref 98–111)
Creatinine, Ser: 1.81 mg/dL — ABNORMAL HIGH (ref 0.61–1.24)
GFR calc Af Amer: 42 mL/min — ABNORMAL LOW (ref >60)
GFR, EST NON AFRICAN AMERICAN: 36 mL/min — AB (ref >60)
Glucose, Bld: 85 mg/dL (ref 70–99)
POTASSIUM: 5.1 mmol/L (ref 3.5–5.1)
Sodium: 139 mmol/L (ref 135–145)
Total Protein: 7.5 g/dL (ref 6.5–8.1)

## 2018-01-09 LAB — CBC WITH DIFFERENTIAL/PLATELET
Basophils Absolute: 0 10*3/uL (ref 0.0–0.1)
Basophils Relative: 0 %
Eosinophils Absolute: 0 10*3/uL (ref 0.0–0.7)
Eosinophils Relative: 1 %
HCT: 32.3 % — ABNORMAL LOW (ref 39.0–52.0)
Hemoglobin: 10.3 g/dL — ABNORMAL LOW (ref 13.0–17.0)
Lymphocytes Relative: 31 %
Lymphs Abs: 1.5 10*3/uL (ref 0.7–4.0)
MCH: 30.7 pg (ref 26.0–34.0)
MCHC: 31.9 g/dL (ref 30.0–36.0)
MCV: 96.1 fL (ref 78.0–100.0)
Monocytes Absolute: 0.7 10*3/uL (ref 0.1–1.0)
Monocytes Relative: 14 %
Neutro Abs: 2.5 10*3/uL (ref 1.7–7.7)
Neutrophils Relative %: 54 %
Platelets: 344 10*3/uL (ref 150–400)
RBC: 3.36 MIL/uL — ABNORMAL LOW (ref 4.22–5.81)
RDW: 15.8 % — ABNORMAL HIGH (ref 11.5–15.5)
WBC: 4.7 10*3/uL (ref 4.0–10.5)

## 2018-01-09 LAB — LACTATE DEHYDROGENASE: LDH: 166 U/L (ref 98–192)

## 2018-01-09 MED ORDER — CYANOCOBALAMIN 1000 MCG/ML IJ SOLN
1000.0000 ug | Freq: Once | INTRAMUSCULAR | Status: AC
  Administered 2018-01-09: 1000 ug via INTRAMUSCULAR
  Filled 2018-01-09: qty 1

## 2018-01-09 MED ORDER — HEPARIN SOD (PORK) LOCK FLUSH 100 UNIT/ML IV SOLN
500.0000 [IU] | Freq: Once | INTRAVENOUS | Status: AC | PRN
  Administered 2018-01-09: 500 [IU]
  Filled 2018-01-09: qty 5

## 2018-01-09 MED ORDER — FAMOTIDINE IN NACL 20-0.9 MG/50ML-% IV SOLN
20.0000 mg | Freq: Once | INTRAVENOUS | Status: AC
  Administered 2018-01-09: 20 mg via INTRAVENOUS
  Filled 2018-01-09: qty 50

## 2018-01-09 MED ORDER — DEXTROSE 5 % IV SOLN
20.0000 mg/m2 | Freq: Once | INTRAVENOUS | Status: AC
  Administered 2018-01-09: 41 mg via INTRAVENOUS
  Filled 2018-01-09: qty 4.1

## 2018-01-09 MED ORDER — SODIUM CHLORIDE 0.9 % IV SOLN
10.0000 mg | Freq: Once | INTRAVENOUS | Status: AC
  Administered 2018-01-09: 10 mg via INTRAVENOUS
  Filled 2018-01-09: qty 1

## 2018-01-09 MED ORDER — SODIUM CHLORIDE 0.9 % IV SOLN
Freq: Once | INTRAVENOUS | Status: AC
  Administered 2018-01-09: 09:00:00 via INTRAVENOUS

## 2018-01-09 MED ORDER — DIPHENHYDRAMINE HCL 50 MG/ML IJ SOLN
25.0000 mg | Freq: Once | INTRAMUSCULAR | Status: AC
  Administered 2018-01-09: 25 mg via INTRAVENOUS
  Filled 2018-01-09: qty 1

## 2018-01-09 MED ORDER — DENOSUMAB 120 MG/1.7ML ~~LOC~~ SOLN
120.0000 mg | Freq: Once | SUBCUTANEOUS | Status: AC
  Administered 2018-01-09: 120 mg via SUBCUTANEOUS
  Filled 2018-01-09: qty 1.7

## 2018-01-09 MED ORDER — PEGFILGRASTIM 6 MG/0.6ML ~~LOC~~ PSKT
6.0000 mg | PREFILLED_SYRINGE | Freq: Once | SUBCUTANEOUS | Status: AC
  Administered 2018-01-09: 6 mg via SUBCUTANEOUS
  Filled 2018-01-09: qty 0.6

## 2018-01-09 NOTE — Progress Notes (Signed)
Dr. Walden Field made aware of Ca++ 8.5. Okay to receive Delton See today per MD and proceed with tx.   Tolerated infusion w/o adverse reaction.  Alert, in no distress.  VSS.  Discharged ambulatory.

## 2018-01-26 ENCOUNTER — Ambulatory Visit (HOSPITAL_COMMUNITY): Payer: Medicare Other

## 2018-01-26 ENCOUNTER — Other Ambulatory Visit (HOSPITAL_COMMUNITY): Payer: Medicare Other

## 2018-01-26 ENCOUNTER — Ambulatory Visit (HOSPITAL_COMMUNITY): Payer: Medicare Other | Admitting: Hematology

## 2018-01-30 NOTE — Progress Notes (Signed)
[address], [place] [address]   CLINIC:  Medical Oncology/Hematology  PCP:  Holley Bouche, NP (Inactive) No address on file None   REASON FOR VISIT: Follow-up for metastatic prostate cancer  CURRENT THERAPY: cabazitaxel every 3 weeks  BRIEF ONCOLOGIC HISTORY:    Malignant neoplasm of prostate ([place])   01/26/2014 Tumor Marker    PSA > 5000    01/28/2014 Initial Biopsy    Metastatic adenocarcinoma of prostate    02/05/2014 Surgery    Bilateral orchiectomy by Dr. Junious Silk    02/26/2014 Tumor Marker    PSA = 399.5    03/14/2014 Tumor Marker    PSA= 89.51    03/14/2014 - 06/26/2014 Chemotherapy    Docetaxel 75 mg/kg every 21 days x 6 cycles    04/24/2014 Tumor Marker    PSA- 19.89    07/23/2014 Procedure    Nephrostomy tube removed by IR, Dr. Geroge Baseman    07/24/2014 Tumor Marker    PSA= 6.15    10/16/2014 Tumor Marker    PSA: 3.54     01/08/2015 Tumor Marker    PSA: 2.13     01/17/2015 Imaging    CT CAP-  Massive pelvic and retroperitoneal lymphadenopathy noted on the prior study has nearly completely resolved, now with only a small amount of residual amorphous soft tissue predominantly around the the infrarenal abdominal aorta.    01/17/2015 Imaging    Bone scan- Widespread osseous metastatic disease with multiple foci of increased activity throughout the skeleton, corresponding with the findings on the prior CT from October, 2015.    04/11/2015 Tumor Marker    PSA: 1.87     07/21/2015 Imaging    Bone scan- Bony metastatic disease again noted at multiple sites, stable from prior study. No progression of bony metastatic disease is demonstrable on this study.    07/25/2015 Imaging    CT CAP- Stable matted soft tissue density in the retroperitoneum and extraperitoneal pelvis consistent with treated disease. No recurrent lymphadenopathy.  No pulmonary metastatic disease. Diffuse stable sclerotic metastatic bone disease.    01/14/2016 Imaging    Bone scan-  Multiple sites of abnormal increased tracer localization involving BILATERAL ribs, T11, T12, questionably RIGHT scapula and LEFT iliac bone suspicious for osseous metastases.    01/23/2016 Imaging    CT CAP- 1. Similar widespread osseous metastasis. 2. Similar soft tissue thickening within the retroperitoneum of the abdomen. Improved left pelvic side wall soft tissue thickening. These are consistent with sites of treated disease. No well-defined adenopathy. 3. No new sites of disease.    05/03/2016 -  Chemotherapy    Jevtana every 21 days     09/06/2016 Imaging    Restaging CT C/A/P: IMPRESSION: 1. Similar appearance of widespread sclerotic bone metastases. 2. No change in soft tissue thickening within the retroperitoneum and left pelvic sidewall. No well defined adenopathy identified. 3. No new sites of disease 4. Aortic Atherosclerosis (ICD10-I70.0). Coronary artery calcifications noted. 5. 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 6. Stable 9 mm pancreatic cystic lesion. Favor pseudocyst. Indolent neoplasm may look similar. Attention on follow-up imaging.    09/06/2016 Imaging    Bone Scan: IMPRESSION: In this patient with known diffuse sclerotic metastatic disease, bone scan does not reveal progression of radiotracer uptake. Several of the areas of previously demonstrated radiotracer uptake appear less prominent.  Change in appearance of radiotracer uptake involving the mandible now more notable on the  right and previously more notable on the left may reflect result of dental disease rather than metastatic disease.     04/08/2017 Tumor Marker    PSA 0.89     INTERVAL HISTORY:  Jeff [name] 72 y.o. male returns for routine follow-up metastatic prostate cancer. Patient is here today doing well with treatment. He does report SOB with exertion. No other complaints at this time.  He reports his appetite at 100% and has no problems maintaining his weight. His energy level is 75%. He denies any any new pain. Denies any nausea, vomiting, or diarrhea. Denies any numbness and tingling.     REVIEW OF SYSTEMS:  Review of Systems  Constitutional:       Sweating.   Respiratory: Positive for shortness of breath (with exertion ).   All other systems reviewed and are negative.    PAST MEDICAL/SURGICAL HISTORY:  Past Medical History:  Diagnosis Date  . Acute renal failure ([place]) 01/26/2014  . Alcohol abuse   . Anemia of chronic disease 01/26/2014  . Bilateral hydronephrosis 01/26/2014  . History of cocaine abuse ([place]) 01/26/2014  . Homelessness 01/31/2014  . Prostate cancer metastatic to multiple sites ([place]) 01/30/2014  . Sepsis ([place])   . UTI (lower urinary tract infection)    Past Surgical History:  Procedure Laterality Date  . ORCHIECTOMY Bilateral 02/05/2014   Procedure: BILATERAL ORCHIECTOMY;  Surgeon: Festus Aloe, MD;  Location: [facility] ORS;  Service: Urology;  Laterality: Bilateral;  . PERCUTANEOUS NEPHROSTOMY Bilateral    IR Dr. Junious Silk changed on 04/22/2014  . PORT-A-CATH REMOVAL Right 08/08/2017   Procedure: REMOVAL PORT-A-CATH RIGHT CHEST (PROCEDURE #2);  Surgeon: Aviva Signs, MD;  Location: [facility] ORS;  Service: General;  Laterality: Right;  . PORTACATH PLACEMENT Right 03/12/14  . PORTACATH PLACEMENT Left 08/08/2017   Procedure: INSERTION POWER PORT WITH ATTACHED CATHETER LEFT SUBCLAVIAN (PROCEDURE #1);  Surgeon: Aviva Signs, MD;  Location: [facility] ORS;  Service: General;  Laterality: Left;     SOCIAL HISTORY:  Social History   Socioeconomic History  . Marital status: Single    Spouse name: Not on file  . Number of children: 1  . Years of education: 9th  . Highest education level: Not on file  Occupational History  . Occupation: disabilty    Employer: [employer]  . Financial resource strain: Not on file  . Food insecurity:     Worry: Not on file    Inability: Not on file  . Transportation needs:    Medical: Not on file    Non-medical: Not on file  Tobacco Use  . Smoking status: Current Every Day Smoker    Packs/day: 0.25    Years: 55.00    Pack years: 13.75    Types: Cigarettes  . Smokeless tobacco: Never Used  Substance and Sexual Activity  . Alcohol use: Yes    Alcohol/week: 1.0 standard drinks    Types: 1 Cans of beer per week  . Drug use: Yes    Types: Cocaine    Comment: last used Cocaine 01/25/14  . Sexual activity: Yes  Lifestyle  . Physical activity:    Days per week: Not on file    Minutes per session: Not on file  . Stress: Not on file  Relationships  . Social connections:    Talks on phone: Not on file    Gets together: Not on file    Attends religious service: Not on file    Active member of club or  organization: Not on file    Attends meetings of clubs or organizations: Not on file    Relationship status: Not on file  . Intimate partner violence:    Fear of current or ex partner: Not on file    Emotionally abused: Not on file    Physically abused: Not on file    Forced sexual activity: Not on file  Other Topics Concern  . Not on file  Social History Narrative   Lives at [employer] 9[employer] grade   1 son, lives in [place], [place]   Can read and write in native language             FAMILY HISTORY:  Family History  Problem Relation Age of Onset  . Cancer Brother 50    CURRENT MEDICATIONS:  Outpatient Encounter Medications as of 01/31/2018  Medication Sig Note  . calcium gluconate 500 MG tablet Take 1 tablet by mouth 2 (two) times daily.   [name] Kitchen lidocaine-prilocaine (EMLA) cream Apply a quarter size amount to port site 1 hour prior to chemo. Do not rub in. Cover with plastic wrap. 07/24/2014: Only takes when getting port flushed  . metoCLOPramide (REGLAN) 5 MG tablet The day after chemo take 1 tab four times a day x 2 days. Then may take 1 tab four times a day if  needed for nausea/vomiting. 07/24/2014: Just takes as needed  . prochlorperazine (COMPAZINE) 10 MG tablet The day after chemo take 1 tab four times a day x 2 days. Then may take 1 tab four times a day if needed for nausea/vomiting. 07/24/2014: Just takes as needed   Facility-Administered Encounter Medications as of 01/31/2018  Medication  . cyanocobalamin ((VITAMIN B-12)) injection 1,000 mcg  . denosumab (XGEVA) injection 120 mg    ALLERGIES:  No Known Allergies   PHYSICAL EXAM:  ECOG Performance status: 1  Vitals:   01/31/18 0914  BP: 102/82  Pulse: (!) 56  Resp: 20  Temp: 97.7 F (36.5 C)  SpO2: 100%   Filed Weights   01/31/18 0914  Weight: 161 lb 12.8 oz (73.4 kg)    Physical Exam  Constitutional: He is oriented to person, place, and time. He appears well-developed and well-nourished.  Cardiovascular: Normal rate, regular rhythm and normal heart sounds.  Pulmonary/Chest: Effort normal and breath sounds normal.  Musculoskeletal: Normal range of motion.  Neurological: He is alert and oriented to person, place, and time.  Skin: Skin is warm and dry.  Psychiatric: He has a normal mood and affect. His behavior is normal. Judgment and thought content normal.     LABORATORY DATA:  I have reviewed the labs as listed.  CBC    Component Value Date/Time   WBC 6.0 01/31/2018 0804   RBC 3.58 (L) 01/31/2018 0804   HGB 11.0 (L) 01/31/2018 0804   HCT 34.1 (L) 01/31/2018 0804   PLT 293 01/31/2018 0804   MCV 95.3 01/31/2018 0804   MCH 30.7 01/31/2018 0804   MCHC 32.3 01/31/2018 0804   RDW 16.2 (H) 01/31/2018 0804   LYMPHSABS 1.8 01/31/2018 0804   MONOABS 0.8 01/31/2018 0804   EOSABS 0.1 01/31/2018 0804   BASOSABS 0.0 01/31/2018 0804   CMP Latest Ref Rng & Units 01/31/2018 01/09/2018 12/14/2017  Glucose 70 - 99 mg/dL 100(H) 85 78  BUN 8 - 23 mg/dL 13 14 22   Creatinine 0.61 - 1.24 mg/dL 1.66(H) 1.81(H) 2.04(H)  Sodium 135 - 145 mmol/L 137 139 137  Potassium 3.5 -  5.1 mmol/L  4.9 5.1 4.9  Chloride 98 - 111 mmol/L 106 109 107  CO2 22 - 32 mmol/L 25 25 23   Calcium 8.9 - 10.3 mg/dL 9.2 8.5(L) 8.4(L)  Total Protein 6.5 - 8.1 g/dL 7.8 7.5 7.6  Total Bilirubin 0.3 - 1.2 mg/dL 0.3 0.5 0.5  Alkaline Phos 38 - 126 U/L 61 44 60  AST 15 - 41 U/L 28 21 26   ALT 0 - 44 U/L 17 12 20          ASSESSMENT & PLAN:   Malignant neoplasm of prostate ([facility]) 1.  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 -Last bone scan on 07/25/2017 showed mixed response compared to prior scan on 09/06/2016.  Last CT scan was on 12/20/2016 which did not show any visceral metastasis but showed pulmonary fibrotic changes. - He is tolerating cabazitaxel very well.  Cycle 24 was given on 01/09/2018.  He is continuing to work part-time.  Denies any new onset pains.  PSA today is 0.7. -he may proceed with cycle 25 today.  We will do scans only if PSA goes up consistently.  I will see him back in 6 weeks for follow-up.  2.  Bone metastasis: - He will continue monthly denosumab injections.  Calcium was within normal limits.  3.  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  However his urine drug screen was positive for cocaine twice.  Hence I did not give any prescription pain medication.  4.  Elevated creatinine: - His creatinine today is 1.66 and is close to his baseline.      Orders placed this encounter:  Orders Placed This Encounter  Procedures  . CBC with Differential/Platelet  . Comprehensive metabolic panel  . Lactate dehydrogenase  . Magnesium  . CBC with Differential/Platelet  . Comprehensive metabolic panel  . PSA      Derek Jack, MD [place] [phone]

## 2018-01-31 ENCOUNTER — Inpatient Hospital Stay (HOSPITAL_COMMUNITY): Payer: Medicare Other

## 2018-01-31 ENCOUNTER — Inpatient Hospital Stay (HOSPITAL_COMMUNITY): Payer: Medicare Other | Attending: Hematology

## 2018-01-31 ENCOUNTER — Encounter (HOSPITAL_COMMUNITY): Payer: Self-pay | Admitting: Hematology

## 2018-01-31 ENCOUNTER — Inpatient Hospital Stay (HOSPITAL_BASED_OUTPATIENT_CLINIC_OR_DEPARTMENT_OTHER): Payer: Medicare Other | Admitting: Hematology

## 2018-01-31 VITALS — BP 108/56 | HR 58 | Temp 97.8°F | Resp 18

## 2018-01-31 VITALS — BP 102/82 | HR 56 | Temp 97.7°F | Resp 20 | Wt 161.8 lb

## 2018-01-31 DIAGNOSIS — Z5111 Encounter for antineoplastic chemotherapy: Secondary | ICD-10-CM | POA: Insufficient documentation

## 2018-01-31 DIAGNOSIS — C61 Malignant neoplasm of prostate: Secondary | ICD-10-CM

## 2018-01-31 DIAGNOSIS — C7951 Secondary malignant neoplasm of bone: Secondary | ICD-10-CM | POA: Insufficient documentation

## 2018-01-31 DIAGNOSIS — M545 Low back pain: Secondary | ICD-10-CM

## 2018-01-31 DIAGNOSIS — Z5189 Encounter for other specified aftercare: Secondary | ICD-10-CM | POA: Diagnosis not present

## 2018-01-31 DIAGNOSIS — Z23 Encounter for immunization: Secondary | ICD-10-CM | POA: Insufficient documentation

## 2018-01-31 DIAGNOSIS — C772 Secondary and unspecified malignant neoplasm of intra-abdominal lymph nodes: Secondary | ICD-10-CM

## 2018-01-31 DIAGNOSIS — R944 Abnormal results of kidney function studies: Secondary | ICD-10-CM

## 2018-01-31 LAB — CBC WITH DIFFERENTIAL/PLATELET
Basophils Absolute: 0 10*3/uL (ref 0.0–0.1)
Basophils Relative: 0 %
Eosinophils Absolute: 0.1 10*3/uL (ref 0.0–0.5)
Eosinophils Relative: 1 %
HEMATOCRIT: 34.1 % — AB (ref 39.0–52.0)
HEMOGLOBIN: 11 g/dL — AB (ref 13.0–17.0)
LYMPHS ABS: 1.8 10*3/uL (ref 0.7–4.0)
Lymphocytes Relative: 31 %
MCH: 30.7 pg (ref 26.0–34.0)
MCHC: 32.3 g/dL (ref 30.0–36.0)
MCV: 95.3 fL (ref 80.0–100.0)
MONOS PCT: 14 %
Monocytes Absolute: 0.8 10*3/uL (ref 0.1–1.0)
NEUTROS ABS: 3.3 10*3/uL (ref 1.7–7.7)
NEUTROS PCT: 54 %
Platelets: 293 10*3/uL (ref 150–400)
RBC: 3.58 MIL/uL — ABNORMAL LOW (ref 4.22–5.81)
RDW: 16.2 % — ABNORMAL HIGH (ref 11.5–15.5)
WBC: 6 10*3/uL (ref 4.0–10.5)
nRBC: 0 % (ref 0.0–0.2)

## 2018-01-31 LAB — COMPREHENSIVE METABOLIC PANEL
ALT: 17 U/L (ref 0–44)
ANION GAP: 6 (ref 5–15)
AST: 28 U/L (ref 15–41)
Albumin: 4 g/dL (ref 3.5–5.0)
Alkaline Phosphatase: 61 U/L (ref 38–126)
BILIRUBIN TOTAL: 0.3 mg/dL (ref 0.3–1.2)
BUN: 13 mg/dL (ref 8–23)
CHLORIDE: 106 mmol/L (ref 98–111)
CO2: 25 mmol/L (ref 22–32)
Calcium: 9.2 mg/dL (ref 8.9–10.3)
Creatinine, Ser: 1.66 mg/dL — ABNORMAL HIGH (ref 0.61–1.24)
GFR calc Af Amer: 46 mL/min — ABNORMAL LOW (ref >60)
GFR, EST NON AFRICAN AMERICAN: 40 mL/min — AB (ref >60)
Glucose, Bld: 100 mg/dL — ABNORMAL HIGH (ref 70–99)
POTASSIUM: 4.9 mmol/L (ref 3.5–5.1)
Sodium: 137 mmol/L (ref 135–145)
TOTAL PROTEIN: 7.8 g/dL (ref 6.5–8.1)

## 2018-01-31 LAB — PSA: PROSTATIC SPECIFIC ANTIGEN: 0.77 ng/mL (ref 0.00–4.00)

## 2018-01-31 LAB — LACTATE DEHYDROGENASE: LDH: 168 U/L (ref 98–192)

## 2018-01-31 MED ORDER — HEPARIN SOD (PORK) LOCK FLUSH 100 UNIT/ML IV SOLN
500.0000 [IU] | Freq: Once | INTRAVENOUS | Status: AC | PRN
  Administered 2018-01-31: 500 [IU]
  Filled 2018-01-31: qty 5

## 2018-01-31 MED ORDER — DEXTROSE 5 % IV SOLN
20.0000 mg/m2 | Freq: Once | INTRAVENOUS | Status: AC
  Administered 2018-01-31: 41 mg via INTRAVENOUS
  Filled 2018-01-31: qty 4.1

## 2018-01-31 MED ORDER — INFLUENZA VAC SPLIT HIGH-DOSE 0.5 ML IM SUSY
0.5000 mL | PREFILLED_SYRINGE | INTRAMUSCULAR | Status: AC
  Administered 2018-01-31: 0.5 mL via INTRAMUSCULAR

## 2018-01-31 MED ORDER — DIPHENHYDRAMINE HCL 50 MG/ML IJ SOLN
25.0000 mg | Freq: Once | INTRAMUSCULAR | Status: AC
  Administered 2018-01-31: 25 mg via INTRAVENOUS
  Filled 2018-01-31: qty 1

## 2018-01-31 MED ORDER — INFLUENZA VAC SPLIT HIGH-DOSE 0.5 ML IM SUSY
PREFILLED_SYRINGE | INTRAMUSCULAR | Status: AC
  Filled 2018-01-31: qty 0.5

## 2018-01-31 MED ORDER — SODIUM CHLORIDE 0.9 % IV SOLN
Freq: Once | INTRAVENOUS | Status: AC
  Administered 2018-01-31: 10:00:00 via INTRAVENOUS

## 2018-01-31 MED ORDER — PEGFILGRASTIM 6 MG/0.6ML ~~LOC~~ PSKT
6.0000 mg | PREFILLED_SYRINGE | Freq: Once | SUBCUTANEOUS | Status: AC
  Administered 2018-01-31: 6 mg via SUBCUTANEOUS
  Filled 2018-01-31: qty 0.6

## 2018-01-31 MED ORDER — SODIUM CHLORIDE 0.9 % IV SOLN
10.0000 mg | Freq: Once | INTRAVENOUS | Status: AC
  Administered 2018-01-31: 10 mg via INTRAVENOUS
  Filled 2018-01-31: qty 1

## 2018-01-31 MED ORDER — FAMOTIDINE IN NACL 20-0.9 MG/50ML-% IV SOLN
20.0000 mg | Freq: Once | INTRAVENOUS | Status: AC
  Administered 2018-01-31: 20 mg via INTRAVENOUS
  Filled 2018-01-31: qty 50

## 2018-01-31 NOTE — Patient Instructions (Addendum)
[place] at [facility]  Discharge Instructions: Follow up with the doctor in 9 weeks with labs and treatment. Contiune your normal 3 weeks treatment cycle with labs.   _______________________________________________________________  Thank you for choosing [place] at [facility] to provide your oncology and hematology care.  To afford each patient quality time with our providers, please arrive at least 15 minutes before your scheduled appointment.  You need to re-schedule your appointment if you arrive 10 or more minutes late.  We strive to give you quality time with our providers, and arriving late affects you and other patients whose appointments are after yours.  Also, if you no show three or more times for appointments you may be dismissed from the clinic.  Again, thank you for choosing [place] at [place] hope is that these requests will allow you access to exceptional care and in a timely manner. _______________________________________________________________  If you have questions after your visit, please contact our office at [phone] between the hours of 8:30 a.m. and 5:00 p.m. Voicemails left after 4:30 p.m. will not be returned until the following business day. _______________________________________________________________  For prescription refill requests, have your pharmacy contact our office. _______________________________________________________________  Recommendations made by the consultant and any test results will be sent to your referring physician. _______________________________________________________________

## 2018-01-31 NOTE — Progress Notes (Signed)
Tolerated infusion w/o adverse reaction.  Alert, in no distress.  VSS.  Neulasta OnPro in place LUE and engaged with green light flashing. Discharged ambulatory.

## 2018-01-31 NOTE — Assessment & Plan Note (Signed)
1.  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 -Last bone scan on 07/25/2017 showed mixed response compared to prior scan on 09/06/2016.  Last CT scan was on 12/20/2016 which did not show any visceral metastasis but showed pulmonary fibrotic changes. - He is tolerating cabazitaxel very well.  Cycle 24 was given on 01/09/2018.  He is continuing to work part-time.  Denies any new onset pains.  PSA today is 0.7. -he may proceed with cycle 25 today.  We will do scans only if PSA goes up consistently.  I will see him back in 6 weeks for follow-up.  2.  Bone metastasis: - He will continue monthly denosumab injections.  Calcium was within normal limits.  3.  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  However his urine drug screen was positive for cocaine twice.  Hence I did not give any prescription pain medication.  4.  Elevated creatinine: - His creatinine today is 1.66 and is close to his baseline.

## 2018-02-06 ENCOUNTER — Ambulatory Visit (HOSPITAL_COMMUNITY): Payer: Medicare Other

## 2018-02-21 ENCOUNTER — Inpatient Hospital Stay (HOSPITAL_COMMUNITY): Payer: Medicare Other

## 2018-02-21 ENCOUNTER — Encounter (HOSPITAL_COMMUNITY): Payer: Self-pay

## 2018-02-21 VITALS — BP 105/74 | HR 86 | Temp 97.8°F | Resp 18 | Wt 164.5 lb

## 2018-02-21 DIAGNOSIS — C61 Malignant neoplasm of prostate: Secondary | ICD-10-CM

## 2018-02-21 DIAGNOSIS — Z5111 Encounter for antineoplastic chemotherapy: Secondary | ICD-10-CM | POA: Diagnosis not present

## 2018-02-21 LAB — CBC WITH DIFFERENTIAL/PLATELET
Abs Immature Granulocytes: 0.01 10*3/uL (ref 0.00–0.07)
BASOS PCT: 0 %
Basophils Absolute: 0 10*3/uL (ref 0.0–0.1)
EOS PCT: 2 %
Eosinophils Absolute: 0.1 10*3/uL (ref 0.0–0.5)
HCT: 33.1 % — ABNORMAL LOW (ref 39.0–52.0)
HEMOGLOBIN: 10.5 g/dL — AB (ref 13.0–17.0)
Immature Granulocytes: 0 %
Lymphocytes Relative: 36 %
Lymphs Abs: 1.7 10*3/uL (ref 0.7–4.0)
MCH: 31 pg (ref 26.0–34.0)
MCHC: 31.7 g/dL (ref 30.0–36.0)
MCV: 97.6 fL (ref 80.0–100.0)
MONO ABS: 0.6 10*3/uL (ref 0.1–1.0)
Monocytes Relative: 12 %
Neutro Abs: 2.3 10*3/uL (ref 1.7–7.7)
Neutrophils Relative %: 50 %
PLATELETS: 289 10*3/uL (ref 150–400)
RBC: 3.39 MIL/uL — AB (ref 4.22–5.81)
RDW: 15.4 % (ref 11.5–15.5)
WBC: 4.6 10*3/uL (ref 4.0–10.5)
nRBC: 0 % (ref 0.0–0.2)

## 2018-02-21 LAB — COMPREHENSIVE METABOLIC PANEL
ALK PHOS: 48 U/L (ref 38–126)
ALT: 19 U/L (ref 0–44)
AST: 24 U/L (ref 15–41)
Albumin: 3.8 g/dL (ref 3.5–5.0)
Anion gap: 5 (ref 5–15)
BUN: 14 mg/dL (ref 8–23)
CALCIUM: 8.6 mg/dL — AB (ref 8.9–10.3)
CO2: 25 mmol/L (ref 22–32)
Chloride: 107 mmol/L (ref 98–111)
Creatinine, Ser: 1.86 mg/dL — ABNORMAL HIGH (ref 0.61–1.24)
GFR calc Af Amer: 40 mL/min — ABNORMAL LOW (ref >60)
GFR calc non Af Amer: 35 mL/min — ABNORMAL LOW (ref >60)
Glucose, Bld: 96 mg/dL (ref 70–99)
Potassium: 4.5 mmol/L (ref 3.5–5.1)
SODIUM: 137 mmol/L (ref 135–145)
Total Bilirubin: 0.4 mg/dL (ref 0.3–1.2)
Total Protein: 7.8 g/dL (ref 6.5–8.1)

## 2018-02-21 MED ORDER — DIPHENHYDRAMINE HCL 50 MG/ML IJ SOLN
INTRAMUSCULAR | Status: AC
  Filled 2018-02-21: qty 1

## 2018-02-21 MED ORDER — DEXTROSE 5 % IV SOLN
20.0000 mg/m2 | Freq: Once | INTRAVENOUS | Status: AC
  Administered 2018-02-21: 41 mg via INTRAVENOUS
  Filled 2018-02-21: qty 4.1

## 2018-02-21 MED ORDER — FAMOTIDINE IN NACL 20-0.9 MG/50ML-% IV SOLN
20.0000 mg | Freq: Once | INTRAVENOUS | Status: AC
  Administered 2018-02-21: 20 mg via INTRAVENOUS

## 2018-02-21 MED ORDER — HEPARIN SOD (PORK) LOCK FLUSH 100 UNIT/ML IV SOLN
500.0000 [IU] | Freq: Once | INTRAVENOUS | Status: AC | PRN
  Administered 2018-02-21: 500 [IU]

## 2018-02-21 MED ORDER — SODIUM CHLORIDE 0.9 % IV SOLN
Freq: Once | INTRAVENOUS | Status: AC
  Administered 2018-02-21: 12:00:00 via INTRAVENOUS

## 2018-02-21 MED ORDER — SODIUM CHLORIDE 0.9% FLUSH
10.0000 mL | INTRAVENOUS | Status: DC | PRN
  Administered 2018-02-21: 10 mL
  Filled 2018-02-21: qty 10

## 2018-02-21 MED ORDER — DIPHENHYDRAMINE HCL 50 MG/ML IJ SOLN
25.0000 mg | Freq: Once | INTRAMUSCULAR | Status: AC
  Administered 2018-02-21: 25 mg via INTRAVENOUS

## 2018-02-21 MED ORDER — SODIUM CHLORIDE 0.9 % IV SOLN
10.0000 mg | Freq: Once | INTRAVENOUS | Status: AC
  Administered 2018-02-21: 10 mg via INTRAVENOUS
  Filled 2018-02-21: qty 1

## 2018-02-21 MED ORDER — FAMOTIDINE IN NACL 20-0.9 MG/50ML-% IV SOLN
INTRAVENOUS | Status: AC
  Filled 2018-02-21: qty 50

## 2018-02-21 NOTE — Patient Instructions (Signed)
[facility] Discharge Instructions for Patients Receiving Chemotherapy  Today you received the following chemotherapy agents jevtana.    If you develop nausea and vomiting that is not controlled by your nausea medication, call the clinic.   BELOW ARE SYMPTOMS THAT SHOULD BE REPORTED IMMEDIATELY:  *FEVER GREATER THAN 100.5 F  *CHILLS WITH OR WITHOUT FEVER  NAUSEA AND VOMITING THAT IS NOT CONTROLLED WITH YOUR NAUSEA MEDICATION  *UNUSUAL SHORTNESS OF BREATH  *UNUSUAL BRUISING OR BLEEDING  TENDERNESS IN MOUTH AND THROAT WITH OR WITHOUT PRESENCE OF ULCERS  *URINARY PROBLEMS  *BOWEL PROBLEMS  UNUSUAL RASH Items with * indicate a potential emergency and should be followed up as soon as possible.  Feel free to call the clinic should you have any questions or concerns. The clinic phone number is [phone].  Please show the [facility] at check-in to the Emergency Department and triage nurse.

## 2018-02-21 NOTE — Progress Notes (Signed)
Labs Ser Creatinine 1.86 reviewed with Dr. Delton Coombes with verbal order ok to treat today.   Patient tolerated chemotherapy with no complaints voiced.  Good blood return noted before and after treatment with port.  Band aid applied.  VSS with discharge and left ambulatory with no s/s of distress noted.  Patient left before Neulasta Onpro applied.  Called the patient at home and wants to wait until tomorrow morning to return for Onpro.  Appointment made and given time.  Patient verbalized understanding.

## 2018-02-22 ENCOUNTER — Inpatient Hospital Stay (HOSPITAL_COMMUNITY): Payer: Medicare Other

## 2018-03-14 ENCOUNTER — Other Ambulatory Visit (HOSPITAL_COMMUNITY): Payer: Medicare Other

## 2018-03-14 ENCOUNTER — Ambulatory Visit (HOSPITAL_COMMUNITY): Payer: Medicare Other

## 2018-04-04 ENCOUNTER — Inpatient Hospital Stay (HOSPITAL_COMMUNITY): Payer: Medicare Other

## 2018-04-04 ENCOUNTER — Other Ambulatory Visit: Payer: Self-pay

## 2018-04-04 ENCOUNTER — Encounter (HOSPITAL_COMMUNITY): Payer: Self-pay | Admitting: Hematology

## 2018-04-04 ENCOUNTER — Inpatient Hospital Stay (HOSPITAL_COMMUNITY): Payer: Medicare Other | Attending: Hematology

## 2018-04-04 ENCOUNTER — Inpatient Hospital Stay (HOSPITAL_BASED_OUTPATIENT_CLINIC_OR_DEPARTMENT_OTHER): Payer: Medicare Other | Admitting: Hematology

## 2018-04-04 VITALS — BP 121/69 | HR 62 | Temp 98.1°F | Resp 18 | Ht 68.5 in | Wt 172.0 lb

## 2018-04-04 DIAGNOSIS — Z5111 Encounter for antineoplastic chemotherapy: Secondary | ICD-10-CM | POA: Insufficient documentation

## 2018-04-04 DIAGNOSIS — C61 Malignant neoplasm of prostate: Secondary | ICD-10-CM

## 2018-04-04 DIAGNOSIS — M545 Low back pain: Secondary | ICD-10-CM | POA: Insufficient documentation

## 2018-04-04 DIAGNOSIS — Z5189 Encounter for other specified aftercare: Secondary | ICD-10-CM | POA: Insufficient documentation

## 2018-04-04 DIAGNOSIS — N189 Chronic kidney disease, unspecified: Secondary | ICD-10-CM | POA: Diagnosis not present

## 2018-04-04 DIAGNOSIS — M899 Disorder of bone, unspecified: Secondary | ICD-10-CM

## 2018-04-04 DIAGNOSIS — C7951 Secondary malignant neoplasm of bone: Secondary | ICD-10-CM

## 2018-04-04 DIAGNOSIS — E538 Deficiency of other specified B group vitamins: Secondary | ICD-10-CM

## 2018-04-04 LAB — CBC WITH DIFFERENTIAL/PLATELET
Abs Immature Granulocytes: 0.02 10*3/uL (ref 0.00–0.07)
BASOS ABS: 0 10*3/uL (ref 0.0–0.1)
Basophils Relative: 0 %
EOS ABS: 0.3 10*3/uL (ref 0.0–0.5)
Eosinophils Relative: 3 %
HEMATOCRIT: 33.2 % — AB (ref 39.0–52.0)
HEMOGLOBIN: 10.6 g/dL — AB (ref 13.0–17.0)
Immature Granulocytes: 0 %
LYMPHS ABS: 1.7 10*3/uL (ref 0.7–4.0)
Lymphocytes Relative: 23 %
MCH: 30.7 pg (ref 26.0–34.0)
MCHC: 31.9 g/dL (ref 30.0–36.0)
MCV: 96.2 fL (ref 80.0–100.0)
Monocytes Absolute: 0.6 10*3/uL (ref 0.1–1.0)
Monocytes Relative: 8 %
NRBC: 0 % (ref 0.0–0.2)
Neutro Abs: 4.8 10*3/uL (ref 1.7–7.7)
Neutrophils Relative %: 66 %
Platelets: 277 10*3/uL (ref 150–400)
RBC: 3.45 MIL/uL — AB (ref 4.22–5.81)
RDW: 15.1 % (ref 11.5–15.5)
WBC: 7.4 10*3/uL (ref 4.0–10.5)

## 2018-04-04 LAB — COMPREHENSIVE METABOLIC PANEL
ALBUMIN: 3.7 g/dL (ref 3.5–5.0)
ALK PHOS: 58 U/L (ref 38–126)
ALT: 15 U/L (ref 0–44)
ANION GAP: 8 (ref 5–15)
AST: 23 U/L (ref 15–41)
BILIRUBIN TOTAL: 0.4 mg/dL (ref 0.3–1.2)
BUN: 19 mg/dL (ref 8–23)
CALCIUM: 9.1 mg/dL (ref 8.9–10.3)
CO2: 22 mmol/L (ref 22–32)
Chloride: 109 mmol/L (ref 98–111)
Creatinine, Ser: 1.5 mg/dL — ABNORMAL HIGH (ref 0.61–1.24)
GFR calc non Af Amer: 46 mL/min — ABNORMAL LOW (ref >60)
GFR, EST AFRICAN AMERICAN: 54 mL/min — AB (ref >60)
GLUCOSE: 102 mg/dL — AB (ref 70–99)
POTASSIUM: 4.2 mmol/L (ref 3.5–5.1)
Sodium: 139 mmol/L (ref 135–145)
TOTAL PROTEIN: 7.6 g/dL (ref 6.5–8.1)

## 2018-04-04 LAB — MAGNESIUM: Magnesium: 1.8 mg/dL (ref 1.7–2.4)

## 2018-04-04 LAB — PSA: PROSTATIC SPECIFIC ANTIGEN: 0.87 ng/mL (ref 0.00–4.00)

## 2018-04-04 LAB — LACTATE DEHYDROGENASE: LDH: 155 U/L (ref 98–192)

## 2018-04-04 MED ORDER — FAMOTIDINE IN NACL 20-0.9 MG/50ML-% IV SOLN
20.0000 mg | Freq: Once | INTRAVENOUS | Status: AC
  Administered 2018-04-04: 20 mg via INTRAVENOUS
  Filled 2018-04-04: qty 50

## 2018-04-04 MED ORDER — DIPHENHYDRAMINE HCL 50 MG/ML IJ SOLN
25.0000 mg | Freq: Once | INTRAMUSCULAR | Status: AC
  Administered 2018-04-04: 25 mg via INTRAVENOUS
  Filled 2018-04-04: qty 1

## 2018-04-04 MED ORDER — DEXTROSE 5 % IV SOLN
20.0000 mg/m2 | Freq: Once | INTRAVENOUS | Status: AC
  Administered 2018-04-04: 41 mg via INTRAVENOUS
  Filled 2018-04-04: qty 4.1

## 2018-04-04 MED ORDER — SODIUM CHLORIDE 0.9 % IV SOLN
Freq: Once | INTRAVENOUS | Status: AC
  Administered 2018-04-04: 11:00:00 via INTRAVENOUS

## 2018-04-04 MED ORDER — HEPARIN SOD (PORK) LOCK FLUSH 100 UNIT/ML IV SOLN
500.0000 [IU] | Freq: Once | INTRAVENOUS | Status: AC | PRN
  Administered 2018-04-04: 500 [IU]
  Filled 2018-04-04: qty 5

## 2018-04-04 MED ORDER — CYANOCOBALAMIN 1000 MCG/ML IJ SOLN
INTRAMUSCULAR | Status: AC
  Filled 2018-04-04: qty 1

## 2018-04-04 MED ORDER — SODIUM CHLORIDE 0.9 % IV SOLN
10.0000 mg | Freq: Once | INTRAVENOUS | Status: AC
  Administered 2018-04-04: 10 mg via INTRAVENOUS
  Filled 2018-04-04: qty 1

## 2018-04-04 MED ORDER — DENOSUMAB 120 MG/1.7ML ~~LOC~~ SOLN
120.0000 mg | Freq: Once | SUBCUTANEOUS | Status: AC
  Administered 2018-04-04: 120 mg via SUBCUTANEOUS
  Filled 2018-04-04: qty 1.7

## 2018-04-04 MED ORDER — CYANOCOBALAMIN 1000 MCG/ML IJ SOLN
1000.0000 ug | Freq: Once | INTRAMUSCULAR | Status: AC
  Administered 2018-04-04: 1000 ug via INTRAMUSCULAR

## 2018-04-04 MED ORDER — SODIUM CHLORIDE 0.9% FLUSH
10.0000 mL | INTRAVENOUS | Status: DC | PRN
  Administered 2018-04-04: 10 mL
  Filled 2018-04-04: qty 10

## 2018-04-04 MED ORDER — PEGFILGRASTIM 6 MG/0.6ML ~~LOC~~ PSKT
6.0000 mg | PREFILLED_SYRINGE | Freq: Once | SUBCUTANEOUS | Status: AC
  Administered 2018-04-04: 6 mg via SUBCUTANEOUS
  Filled 2018-04-04: qty 0.6

## 2018-04-04 NOTE — Assessment & Plan Note (Addendum)
1.  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 -Last bone scan on 07/25/2017 showed mixed response compared to prior scan on 09/06/2016.  Last CT scan was on 12/20/2016 which did not show any visceral metastasis but showed pulmonary fibrotic changes. - He is tolerating cabazitaxel exceptionally well.  He may proceed with cycle 28 today.  His PSA on 01/31/2018 was 0.7.  His PSA from today's pending. -He is continuing to work as a Radiographer, therapeutic.  Denies any peripheral neuropathy symptoms. - We will follow-up on the PSA from today.  He will be seen back in 2 months for follow-up.  We plan to repeat scans only if he has signs of progression.  2.  Bone metastasis: - His last denosumab was in September.  I have checked his calcium.  He will be restarted back on denosumab.  He will also continue calcium supplements.    3.  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  However his urine drug screen was positive for cocaine twice.  Hence I did not give any prescription pain medication.  4.  Elevated creatinine: -He has mild CKD.  His creatinine is 1.5 today.

## 2018-04-04 NOTE — Progress Notes (Signed)
[id number] Labs reviewed with and pt seen by Dr. Delton Coombes and pt approved for Jevtana infusion today per MD                                          Adriana Reams tolerated Jevtana infusion,Neulasta on-pro and Vit B12 and Xgeva injections well without complaints or incident. Calcium 9.1 today and pt denied any tooth or jaw pain and no recent or future dental visits. Pt continues to take his Calcium PO as prescribed. Neulaata on-pro applied to pt's left arm with green indicator light flashing. VSS upon discharge. Pt discharged self ambulatory in satisfactory condition

## 2018-04-04 NOTE — Patient Instructions (Signed)
[facility] at [facility] Discharge Instructions     Thank you for choosing [facility] at [facility] to provide your oncology and hematology care.  To afford each patient quality time with our provider, please arrive at least 15 minutes before your scheduled appointment time.   If you have a lab appointment with the Cancer Center please come in thru the  Main Entrance and check in at the main information desk  You need to re-schedule your appointment should you arrive 10 or more minutes late.  We strive to give you quality time with our providers, and arriving late affects you and other patients whose appointments are after yours.  Also, if you no show three or more times for appointments you may be dismissed from the clinic at the providers discretion.     Again, thank you for choosing [facility].  Our hope is that these requests will decrease the amount of time that you wait before being seen by our physicians.       _____________________________________________________________  Should you have questions after your visit to [facility], please contact our office at [phone] between the hours of 8:00 a.m. and 4:30 p.m.  Voicemails left after 4:00 p.m. will not be returned until the following business day.  For prescription refill requests, have your pharmacy contact our office and allow 72 hours.    Cancer Center Support Programs:   > Cancer Support Group  2nd Tuesday of the month 1pm-2pm, Journey Room    

## 2018-04-04 NOTE — Patient Instructions (Addendum)
[facility] Discharge Instructions for Patients Receiving Chemotherapy   Beginning January 23rd 2017 lab work for the [facility] will be done in the  Main lab at [facility] on 1st floor. If you have a lab appointment with the [facility] please come in thru the  Main Entrance and check in at the main information desk   Today you received the following chemotherapy agents Jevtana as well as Neulasta on-pro and Vit B12 and Xgeva injections. Follow-up as scheduled. Call clinic for any questions or concerns  To help prevent nausea and vomiting after your treatment, we encourage you to take your nausea medication   If you develop nausea and vomiting, or diarrhea that is not controlled by your medication, call the clinic.  The clinic phone number is [phone]. Office hours are Monday-Friday 8:30am-5:00pm.  BELOW ARE SYMPTOMS THAT SHOULD BE REPORTED IMMEDIATELY:  *FEVER GREATER THAN 101.0 F  *CHILLS WITH OR WITHOUT FEVER  NAUSEA AND VOMITING THAT IS NOT CONTROLLED WITH YOUR NAUSEA MEDICATION  *UNUSUAL SHORTNESS OF BREATH  *UNUSUAL BRUISING OR BLEEDING  TENDERNESS IN MOUTH AND THROAT WITH OR WITHOUT PRESENCE OF ULCERS  *URINARY PROBLEMS  *BOWEL PROBLEMS  UNUSUAL RASH Items with * indicate a potential emergency and should be followed up as soon as possible. If you have an emergency after office hours please contact your primary care physician or go to the nearest emergency department.  Please call the clinic during office hours if you have any questions or concerns.   You may also contact the Patient Navigator at [phone] should you have any questions or need assistance in obtaining follow up care.      Resources For Cancer Patients and their Caregivers ? American Cancer Society: Can assist with transportation, wigs, general needs, runs Look Good Feel Better.        (867) 107-6342 ? Cancer Care: Provides financial assistance, online support  groups, medication/co-pay assistance.  1-800-813-HOPE 724 668 5296) ? [place] Assists [place] cancer patients and their families through emotional , educational and financial support.  780 260 1176 ? [place] DSS Where to apply for food stamps, Medicaid and utility assistance. (859)564-7275 ? [facility]: Transportation to medical appointments. 9415600904 ? Social Security Administration: May apply for disability if have a Stage IV cancer. 629-868-1935 (415)731-8197 ? [employer], Disability and Transit Services: Assists with nutrition, care and transit needs. 902-030-7653

## 2018-04-04 NOTE — Progress Notes (Signed)
Jeff [name], Jeff [name] [id number]   CLINIC:  Medical Oncology/Hematology  PCP:  Jeff Bouche, NP (Inactive) No address on file None   REASON FOR VISIT: Follow-up for metastatic prostate cancer  CURRENT THERAPY: cabazitaxel every 3 weeks  BRIEF ONCOLOGIC HISTORY:    Malignant neoplasm of prostate ([place])   01/26/2014 Tumor Marker    PSA > 5000    01/28/2014 Initial Biopsy    Metastatic adenocarcinoma of prostate    02/05/2014 Surgery    Bilateral orchiectomy by Dr. Junious Silk    02/26/2014 Tumor Marker    PSA = 399.5    03/14/2014 Tumor Marker    PSA= 89.51    03/14/2014 - 06/26/2014 Chemotherapy    Docetaxel 75 mg/kg every 21 days x 6 cycles    04/24/2014 Tumor Marker    PSA- 19.89    07/23/2014 Procedure    Nephrostomy tube removed by IR, Dr. Geroge Baseman    07/24/2014 Tumor Marker    PSA= 6.15    10/16/2014 Tumor Marker    PSA: 3.54     01/08/2015 Tumor Marker    PSA: 2.13     01/17/2015 Imaging    CT CAP-  Massive pelvic and retroperitoneal lymphadenopathy noted on the prior study has nearly completely resolved, now with only a small amount of residual amorphous soft tissue predominantly around the the infrarenal abdominal aorta.    01/17/2015 Imaging    Bone scan- Widespread osseous metastatic disease with multiple foci of increased activity throughout the skeleton, corresponding with the findings on the prior CT from October, 2015.    04/11/2015 Tumor Marker    PSA: 1.87     07/21/2015 Imaging    Bone scan- Bony metastatic disease again noted at multiple sites, stable from prior study. No progression of bony metastatic disease is demonstrable on this study.    07/25/2015 Imaging    CT CAP- Stable matted soft tissue density in the retroperitoneum and extraperitoneal pelvis consistent with treated disease. No recurrent lymphadenopathy.  No pulmonary metastatic disease. Diffuse stable sclerotic metastatic bone disease.    01/14/2016 Imaging    Bone scan-  Multiple sites of abnormal increased tracer localization involving BILATERAL ribs, T11, T12, questionably RIGHT scapula and LEFT iliac bone suspicious for osseous metastases.    01/23/2016 Imaging    CT CAP- 1. Similar widespread osseous metastasis. 2. Similar soft tissue thickening within the retroperitoneum of the abdomen. Improved left pelvic side wall soft tissue thickening. These are consistent with sites of treated disease. No well-defined adenopathy. 3. No new sites of disease.    04/27/2016 -  Chemotherapy    The patient had pegfilgrastim (NEULASTA) injection 6 mg, 6 mg, Subcutaneous,  Once, 1 of 1 cycle Administration: 6 mg (05/05/2016) pegfilgrastim (NEULASTA ONPRO KIT) injection 6 mg, 6 mg, Subcutaneous, Once, 27 of 31 cycles Administration: 6 mg (05/24/2016), 6 mg (06/14/2016), 6 mg (07/07/2016), 6 mg (07/28/2016), 6 mg (08/18/2016), 6 mg (09/08/2016), 6 mg (09/29/2016), 6 mg (10/20/2016), 6 mg (11/10/2016), 6 mg (02/14/2017), 6 mg (03/07/2017), 6 mg (03/28/2017), 6 mg (04/20/2017), 6 mg (05/11/2017), 6 mg (06/27/2017), 6 mg (08/30/2017), 6 mg (09/20/2017), 6 mg (10/11/2017), 6 mg (11/02/2017), 6 mg (08/09/2017), 6 mg (11/23/2017), 6 mg (12/14/2017), 6 mg (01/09/2018), 6 mg (01/31/2018) cabazitaxel (JEVTANA) 41 mg in dextrose 5 % 250 mL chemo infusion, 20 mg/m2 = 41 mg (100 % of original dose 20 mg/m2), Intravenous,  Once, 28 of 32 cycles Dose modification: 20 mg/m2 (  original dose 20 mg/m2, Cycle 1, Reason: Dose not tolerated, Comment: recommended by Data to use 66m/m) Administration: 41 mg (05/03/2016), 41 mg (05/24/2016), 41 mg (06/14/2016), 41 mg (07/07/2016), 41 mg (07/28/2016), 41 mg (08/18/2016), 41 mg (09/08/2016), 41 mg (09/29/2016), 41 mg (10/20/2016), 41 mg (11/10/2016), 41 mg (02/14/2017), 41 mg (03/07/2017), 41 mg (03/28/2017), 41 mg (04/20/2017), 41 mg (05/11/2017), 41 mg (06/27/2017), 41 mg (08/30/2017), 41 mg (09/20/2017), 41 mg (10/11/2017), 41 mg (11/02/2017), 41 mg (08/09/2017), 41 mg  (11/23/2017), 41 mg (12/14/2017), 41 mg (01/09/2018), 41 mg (01/31/2018), 41 mg (02/21/2018)  for chemotherapy treatment.     05/03/2016 -  Chemotherapy    Jevtana every 21 days     09/06/2016 Imaging    Restaging CT C/A/P: IMPRESSION: 1. Similar appearance of widespread sclerotic bone metastases. 2. No change in soft tissue thickening within the retroperitoneum and left pelvic sidewall. No well defined adenopathy identified. 3. No new sites of disease 4. Aortic Atherosclerosis (ICD10-I70.0). Coronary artery calcifications noted. 5. 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 6. Stable 9 mm pancreatic cystic lesion. Favor pseudocyst. Indolent neoplasm may look similar. Attention on follow-up imaging.    09/06/2016 Imaging    Bone Scan: IMPRESSION: In this patient with known diffuse sclerotic metastatic disease, bone scan does not reveal progression of radiotracer uptake. Several of the areas of previously demonstrated radiotracer uptake appear less prominent.  Change in appearance of radiotracer uptake involving the mandible now more notable on the right and previously more notable on the left may reflect result of dental disease rather than metastatic disease.     04/08/2017 Tumor Marker    PSA 0.89     INTERVAL HISTORY:  [name] male returns for routine follow-up for metastatic prostate cancer. He is here today and doing well with his treatment. He denies any numbness or tingling in his hands or toes. Denies any new pains. Denies any headaches. Denies any sores in his mouth. Denies any fevers or recent infections. He lives at home alone and performs all his own ADLs and activities. 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 He reports his appetite is 100% and his energy level is 75%.     REVIEW OF SYSTEMS:  Review of Systems  All other systems reviewed and are negative.    PAST MEDICAL/SURGICAL  HISTORY:  Past Medical History:  Diagnosis Date  . Acute renal failure ([place] 01/26/2014  . Alcohol abuse   . Anemia of chronic disease 01/26/2014  . Bilateral hydronephrosis 01/26/2014  . History of cocaine abuse ([facility] 01/26/2014  . Homelessness 01/31/2014  . Prostate cancer metastatic to multiple sites ([facility] 01/30/2014  . Sepsis ([facility]   . UTI (lower urinary tract infection)    Past Surgical History:  Procedure Laterality Date  . ORCHIECTOMY Bilateral 02/05/2014   Procedure: BILATERAL ORCHIECTOMY;  Surgeon: MFestus Aloe MD;  Location: [facility];  Service: Urology;  Laterality: Bilateral;  . PERCUTANEOUS NEPHROSTOMY Bilateral    IR Dr. EJunious Silkchanged on 04/22/2014  . PORT-A-CATH REMOVAL Right 08/08/2017   Procedure: REMOVAL PORT-A-CATH RIGHT CHEST (PROCEDURE #2);  Surgeon: JAviva Signs MD;  Location: [facility];  Service: General;  Laterality: Right;  . PORTACATH PLACEMENT Right 03/12/14  . PORTACATH PLACEMENT Left 08/08/2017   Procedure: INSERTION POWER PORT WITH ATTACHED CATHETER LEFT SUBCLAVIAN (PROCEDURE #1);  Surgeon: JAviva Signs MD;  Location: [facility];  Service: General;  Laterality: Left;     SOCIAL HISTORY:  Social History  Socioeconomic History  . Marital status: Single    Spouse name: Not on file  . Number of children: 1  . Years of education: 9th  . Highest education level: Not on file  Occupational History  . Occupation: disabilty    Employer: [employer]  . Financial resource strain: Not on file  . Food insecurity:    Worry: Not on file    Inability: Not on file  . Transportation needs:    Medical: Not on file    Non-medical: Not on file  Tobacco Use  . Smoking status: Current Every Day Smoker    Packs/day: 0.25    Years: 55.00    Pack years: 13.75    Types: Cigarettes  . Smokeless tobacco: Never Used  Substance and Sexual Activity  . Alcohol use: Yes    Alcohol/week: 1.0 standard drinks    Types: 1 Cans of beer per week  . Drug  use: Yes    Types: Cocaine    Comment: last used Cocaine 01/25/14  . Sexual activity: Yes  Lifestyle  . Physical activity:    Days per week: Not on file    Minutes per session: Not on file  . Stress: Not on file  Relationships  . Social connections:    Talks on phone: Not on file    Gets together: Not on file    Attends religious service: Not on file    Active member of club or organization: Not on file    Attends meetings of clubs or organizations: Not on file    Relationship status: Not on file  . Intimate partner violence:    Fear of current or ex partner: Not on file    Emotionally abused: Not on file    Physically abused: Not on file    Forced sexual activity: Not on file  Other Topics Concern  . Not on file  Social History Narrative   Lives at [employer] 9th grade   1 son, lives in [place], [place]   Can read and write in native language             FAMILY HISTORY:  Family History  Problem Relation Age of Onset  . Cancer Brother 81    CURRENT MEDICATIONS:  Outpatient Encounter Medications as of 04/04/2018  Medication Sig Note  . calcium gluconate 500 MG tablet Take 1 tablet by mouth 2 (two) times daily.   [name] Kitchen lidocaine-prilocaine (EMLA) cream Apply a quarter size amount to port site 1 hour prior to chemo. Do not rub in. Cover with plastic wrap. (Patient not taking: Reported on 04/04/2018) 07/24/2014: Only takes when getting port flushed  . metoCLOPramide (REGLAN) 5 MG tablet The day after chemo take 1 tab four times a day x 2 days. Then may take 1 tab four times a day if needed for nausea/vomiting. (Patient not taking: Reported on 04/04/2018) 07/24/2014: Just takes as needed  . prochlorperazine (COMPAZINE) 10 MG tablet The day after chemo take 1 tab four times a day x 2 days. Then may take 1 tab four times a day if needed for nausea/vomiting. (Patient not taking: Reported on 04/04/2018) 07/24/2014: Just takes as needed   Facility-Administered Encounter  Medications as of 04/04/2018  Medication  . cyanocobalamin ((VITAMIN B-12)) injection 1,000 mcg  . denosumab (XGEVA) injection 120 mg    ALLERGIES:  No Known Allergies   PHYSICAL EXAM:  ECOG Performance status: 1  Vitals:   04/04/18  0942  BP: 122/66  Pulse: 87  Resp: 18  Temp: 98.2 F (36.8 C)  SpO2: 99%   Filed Weights   04/04/18 0942  Weight: 172 lb (78 kg)    Physical Exam  Constitutional: He is oriented to person, place, and time. He appears well-developed and well-nourished.  Musculoskeletal: Normal range of motion.  Neurological: He is alert and oriented to person, place, and time.  Skin: Skin is warm and dry.  Psychiatric: He has a normal mood and affect. His behavior is normal. Judgment and thought content normal.     LABORATORY DATA:  I have reviewed the labs as listed.  CBC    Component Value Date/Time   WBC 7.4 04/04/2018 0824   RBC 3.45 (L) 04/04/2018 0824   HGB 10.6 (L) 04/04/2018 0824   HCT 33.2 (L) 04/04/2018 0824   PLT 277 04/04/2018 0824   MCV 96.2 04/04/2018 0824   MCH 30.7 04/04/2018 0824   MCHC 31.9 04/04/2018 0824   RDW 15.1 04/04/2018 0824   LYMPHSABS 1.7 04/04/2018 0824   MONOABS 0.6 04/04/2018 0824   EOSABS 0.3 04/04/2018 0824   BASOSABS 0.0 04/04/2018 0824   CMP Latest Ref Rng & Units 04/04/2018 02/21/2018 01/31/2018  Glucose 70 - 99 mg/dL 102(H) 96 100(H)  BUN 8 - 23 mg/dL _0 Creatinine 0.61 - 1.24 mg/dL 1.50(H) 1.86(H) 1.66(H)  Sodium 135 - 145 mmol/L 139 137 137  Potassium 3.5 - 5.1 mmol/L 4.2 4.5 4.9  Chloride 98 - 111 mmol/L 109 107 106  CO2 22 - 32 mmol/L _1 Calcium 8.9 - 10.3 mg/dL 9.1 8.6(L) 9.2  Total Protein 6.5 - 8.1 g/dL 7.6 7.8 7.8  Total Bilirubin 0.3 - 1.2 mg/dL 0.4 0.4 0.3  Alkaline Phos 38 - 126 U/L 58 48 61  AST 15 - 41 U/L _2 ALT 0 - 44 U/L _3 I have reviewed Francene Finders, NP's note and agree with the documentation.  I personally performed a face-to-face visit, made  revisions and my assessment and plan is as follows.     ASSESSMENT & PLAN:   Malignant neoplasm of prostate ([place]) 1.  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 -Last bone scan on 07/25/2017 showed mixed response compared to prior scan on 09/06/2016.  Last CT scan was on 12/20/2016 which did not show any visceral metastasis but showed pulmonary fibrotic changes. - He is tolerating cabazitaxel exceptionally well.  He may proceed with cycle 28 today.  His PSA on 01/31/2018 was 0.7.  His PSA from today's pending. -He is continuing to work as a Radiographer, therapeutic.  Denies any peripheral neuropathy symptoms. - We will follow-up on the PSA from today.  He will be seen back in 2 months for follow-up.  We plan to repeat scans only if he has signs of progression.  2.  Bone metastasis: - His last denosumab was in September.  I have checked his calcium.  He will be restarted back on denosumab.  He will also continue calcium supplements.    3.  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  However his urine drug screen was positive for cocaine twice.  Hence I did not give any prescription pain medication.  4.  Elevated creatinine: -  He has mild CKD.  His creatinine is 1.5 today.      Orders placed this encounter:  Orders Placed This Encounter  Procedures  . PSA  . CBC with Differential/Platelet  . Comprehensive metabolic panel      Derek Jack, MD [place] [phone]

## 2018-04-27 ENCOUNTER — Encounter (HOSPITAL_COMMUNITY): Payer: Self-pay

## 2018-04-27 ENCOUNTER — Other Ambulatory Visit: Payer: Self-pay

## 2018-04-27 ENCOUNTER — Inpatient Hospital Stay (HOSPITAL_COMMUNITY): Payer: Medicare HMO | Attending: Hematology

## 2018-04-27 ENCOUNTER — Inpatient Hospital Stay (HOSPITAL_COMMUNITY): Payer: Medicare HMO

## 2018-04-27 VITALS — BP 127/69 | HR 69 | Temp 97.6°F | Resp 18 | Wt 171.0 lb

## 2018-04-27 DIAGNOSIS — C7951 Secondary malignant neoplasm of bone: Secondary | ICD-10-CM | POA: Insufficient documentation

## 2018-04-27 DIAGNOSIS — Z5111 Encounter for antineoplastic chemotherapy: Secondary | ICD-10-CM | POA: Insufficient documentation

## 2018-04-27 DIAGNOSIS — C61 Malignant neoplasm of prostate: Secondary | ICD-10-CM

## 2018-04-27 DIAGNOSIS — Z5189 Encounter for other specified aftercare: Secondary | ICD-10-CM | POA: Insufficient documentation

## 2018-04-27 LAB — COMPREHENSIVE METABOLIC PANEL
ALT: 12 U/L (ref 0–44)
AST: 21 U/L (ref 15–41)
Albumin: 3.8 g/dL (ref 3.5–5.0)
Alkaline Phosphatase: 54 U/L (ref 38–126)
Anion gap: 9 (ref 5–15)
BUN: 22 mg/dL (ref 8–23)
CO2: 27 mmol/L (ref 22–32)
Calcium: 9.7 mg/dL (ref 8.9–10.3)
Chloride: 103 mmol/L (ref 98–111)
Creatinine, Ser: 1.64 mg/dL — ABNORMAL HIGH (ref 0.61–1.24)
GFR calc Af Amer: 48 mL/min — ABNORMAL LOW (ref >60)
GFR, EST NON AFRICAN AMERICAN: 41 mL/min — AB (ref >60)
Glucose, Bld: 105 mg/dL — ABNORMAL HIGH (ref 70–99)
Potassium: 4.3 mmol/L (ref 3.5–5.1)
Sodium: 139 mmol/L (ref 135–145)
Total Bilirubin: 0.4 mg/dL (ref 0.3–1.2)
Total Protein: 7.8 g/dL (ref 6.5–8.1)

## 2018-04-27 LAB — CBC WITH DIFFERENTIAL/PLATELET
Abs Immature Granulocytes: 0.02 10*3/uL (ref 0.00–0.07)
Basophils Absolute: 0 10*3/uL (ref 0.0–0.1)
Basophils Relative: 0 %
Eosinophils Absolute: 0.1 10*3/uL (ref 0.0–0.5)
Eosinophils Relative: 2 %
HCT: 34.6 % — ABNORMAL LOW (ref 39.0–52.0)
Hemoglobin: 10.9 g/dL — ABNORMAL LOW (ref 13.0–17.0)
IMMATURE GRANULOCYTES: 0 %
Lymphocytes Relative: 22 %
Lymphs Abs: 1.7 10*3/uL (ref 0.7–4.0)
MCH: 29.3 pg (ref 26.0–34.0)
MCHC: 31.5 g/dL (ref 30.0–36.0)
MCV: 93 fL (ref 80.0–100.0)
Monocytes Absolute: 0.9 10*3/uL (ref 0.1–1.0)
Monocytes Relative: 12 %
Neutro Abs: 4.8 10*3/uL (ref 1.7–7.7)
Neutrophils Relative %: 64 %
Platelets: 400 10*3/uL (ref 150–400)
RBC: 3.72 MIL/uL — ABNORMAL LOW (ref 4.22–5.81)
RDW: 16.5 % — ABNORMAL HIGH (ref 11.5–15.5)
WBC: 7.5 10*3/uL (ref 4.0–10.5)
nRBC: 0 % (ref 0.0–0.2)

## 2018-04-27 MED ORDER — SODIUM CHLORIDE 0.9 % IV SOLN
Freq: Once | INTRAVENOUS | Status: AC
  Administered 2018-04-27: 11:00:00 via INTRAVENOUS

## 2018-04-27 MED ORDER — HEPARIN SOD (PORK) LOCK FLUSH 100 UNIT/ML IV SOLN
500.0000 [IU] | Freq: Once | INTRAVENOUS | Status: AC | PRN
  Administered 2018-04-27: 500 [IU]

## 2018-04-27 MED ORDER — PEGFILGRASTIM 6 MG/0.6ML ~~LOC~~ PSKT
6.0000 mg | PREFILLED_SYRINGE | Freq: Once | SUBCUTANEOUS | Status: AC
  Administered 2018-04-27: 6 mg via SUBCUTANEOUS
  Filled 2018-04-27: qty 0.6

## 2018-04-27 MED ORDER — FAMOTIDINE IN NACL 20-0.9 MG/50ML-% IV SOLN
INTRAVENOUS | Status: AC
  Filled 2018-04-27: qty 50

## 2018-04-27 MED ORDER — DEXTROSE 5 % IV SOLN
20.0000 mg/m2 | Freq: Once | INTRAVENOUS | Status: AC
  Administered 2018-04-27: 41 mg via INTRAVENOUS
  Filled 2018-04-27: qty 4.1

## 2018-04-27 MED ORDER — FAMOTIDINE IN NACL 20-0.9 MG/50ML-% IV SOLN
20.0000 mg | Freq: Once | INTRAVENOUS | Status: AC
  Administered 2018-04-27: 20 mg via INTRAVENOUS

## 2018-04-27 MED ORDER — DIPHENHYDRAMINE HCL 50 MG/ML IJ SOLN
25.0000 mg | Freq: Once | INTRAMUSCULAR | Status: AC
  Administered 2018-04-27: 25 mg via INTRAVENOUS
  Filled 2018-04-27: qty 1

## 2018-04-27 MED ORDER — SODIUM CHLORIDE 0.9 % IV SOLN
10.0000 mg | Freq: Once | INTRAVENOUS | Status: AC
  Administered 2018-04-27: 10 mg via INTRAVENOUS
  Filled 2018-04-27: qty 1

## 2018-04-27 NOTE — Progress Notes (Signed)
Labs reviewed with Dr. Delton Coombes - he is aware of creatinine of 1.64.  Okay to tx today per MD.   Tolerated infusion w/o adverse reaction.  Alert, in no distress.  Discharged ambulatory with Neulasta OnPro engaged.

## 2018-05-02 ENCOUNTER — Ambulatory Visit (HOSPITAL_COMMUNITY): Payer: Medicare Other

## 2018-05-02 ENCOUNTER — Other Ambulatory Visit (HOSPITAL_COMMUNITY): Payer: Medicare Other

## 2018-05-17 ENCOUNTER — Other Ambulatory Visit (HOSPITAL_COMMUNITY): Payer: Self-pay | Admitting: Hematology

## 2018-05-18 ENCOUNTER — Ambulatory Visit (HOSPITAL_COMMUNITY): Payer: Medicare Other | Admitting: Hematology

## 2018-05-18 ENCOUNTER — Inpatient Hospital Stay (HOSPITAL_COMMUNITY): Payer: Medicare HMO

## 2018-05-18 ENCOUNTER — Encounter (HOSPITAL_COMMUNITY): Payer: Self-pay

## 2018-05-18 ENCOUNTER — Other Ambulatory Visit (HOSPITAL_COMMUNITY): Payer: Medicare Other

## 2018-05-18 ENCOUNTER — Other Ambulatory Visit: Payer: Self-pay

## 2018-05-18 ENCOUNTER — Other Ambulatory Visit (HOSPITAL_COMMUNITY): Payer: Self-pay | Admitting: Nurse Practitioner

## 2018-05-18 ENCOUNTER — Ambulatory Visit (HOSPITAL_COMMUNITY): Payer: Medicare Other

## 2018-05-18 VITALS — BP 135/83 | HR 69 | Temp 98.6°F | Resp 16 | Wt 173.0 lb

## 2018-05-18 DIAGNOSIS — Z5189 Encounter for other specified aftercare: Secondary | ICD-10-CM | POA: Diagnosis not present

## 2018-05-18 DIAGNOSIS — E538 Deficiency of other specified B group vitamins: Secondary | ICD-10-CM

## 2018-05-18 DIAGNOSIS — C61 Malignant neoplasm of prostate: Secondary | ICD-10-CM

## 2018-05-18 DIAGNOSIS — M899 Disorder of bone, unspecified: Secondary | ICD-10-CM

## 2018-05-18 DIAGNOSIS — C7951 Secondary malignant neoplasm of bone: Secondary | ICD-10-CM | POA: Diagnosis not present

## 2018-05-18 DIAGNOSIS — Z5111 Encounter for antineoplastic chemotherapy: Secondary | ICD-10-CM | POA: Diagnosis not present

## 2018-05-18 LAB — COMPREHENSIVE METABOLIC PANEL
ALT: 13 U/L (ref 0–44)
AST: 16 U/L (ref 15–41)
Albumin: 3.9 g/dL (ref 3.5–5.0)
Alkaline Phosphatase: 57 U/L (ref 38–126)
Anion gap: 6 (ref 5–15)
BUN: 13 mg/dL (ref 8–23)
CO2: 23 mmol/L (ref 22–32)
Calcium: 8.8 mg/dL — ABNORMAL LOW (ref 8.9–10.3)
Chloride: 108 mmol/L (ref 98–111)
Creatinine, Ser: 1.46 mg/dL — ABNORMAL HIGH (ref 0.61–1.24)
GFR, EST AFRICAN AMERICAN: 55 mL/min — AB (ref >60)
GFR, EST NON AFRICAN AMERICAN: 48 mL/min — AB (ref >60)
Glucose, Bld: 94 mg/dL (ref 70–99)
Potassium: 4.5 mmol/L (ref 3.5–5.1)
Sodium: 137 mmol/L (ref 135–145)
TOTAL PROTEIN: 7.6 g/dL (ref 6.5–8.1)
Total Bilirubin: 0.2 mg/dL — ABNORMAL LOW (ref 0.3–1.2)

## 2018-05-18 LAB — CBC WITH DIFFERENTIAL/PLATELET
Abs Immature Granulocytes: 0.02 10*3/uL (ref 0.00–0.07)
Basophils Absolute: 0 10*3/uL (ref 0.0–0.1)
Basophils Relative: 0 %
EOS ABS: 0.1 10*3/uL (ref 0.0–0.5)
Eosinophils Relative: 2 %
HCT: 34.9 % — ABNORMAL LOW (ref 39.0–52.0)
Hemoglobin: 10.7 g/dL — ABNORMAL LOW (ref 13.0–17.0)
IMMATURE GRANULOCYTES: 0 %
Lymphocytes Relative: 25 %
Lymphs Abs: 1.7 10*3/uL (ref 0.7–4.0)
MCH: 29.2 pg (ref 26.0–34.0)
MCHC: 30.7 g/dL (ref 30.0–36.0)
MCV: 95.4 fL (ref 80.0–100.0)
Monocytes Absolute: 1 10*3/uL (ref 0.1–1.0)
Monocytes Relative: 14 %
NEUTROS PCT: 59 %
Neutro Abs: 4 10*3/uL (ref 1.7–7.7)
Platelets: 314 10*3/uL (ref 150–400)
RBC: 3.66 MIL/uL — ABNORMAL LOW (ref 4.22–5.81)
RDW: 17.5 % — AB (ref 11.5–15.5)
WBC: 6.8 10*3/uL (ref 4.0–10.5)
nRBC: 0 % (ref 0.0–0.2)

## 2018-05-18 MED ORDER — STERILE WATER FOR INJECTION IJ SOLN
INTRAMUSCULAR | Status: AC
  Filled 2018-05-18: qty 10

## 2018-05-18 MED ORDER — SODIUM CHLORIDE 0.9 % IV SOLN
10.0000 mg | Freq: Once | INTRAVENOUS | Status: AC
  Administered 2018-05-18: 10 mg via INTRAVENOUS
  Filled 2018-05-18: qty 1

## 2018-05-18 MED ORDER — FAMOTIDINE IN NACL 20-0.9 MG/50ML-% IV SOLN
20.0000 mg | Freq: Once | INTRAVENOUS | Status: AC
  Administered 2018-05-18: 20 mg via INTRAVENOUS
  Filled 2018-05-18: qty 50

## 2018-05-18 MED ORDER — PEGFILGRASTIM 6 MG/0.6ML ~~LOC~~ PSKT
6.0000 mg | PREFILLED_SYRINGE | Freq: Once | SUBCUTANEOUS | Status: AC
  Administered 2018-05-18: 6 mg via SUBCUTANEOUS
  Filled 2018-05-18: qty 0.6

## 2018-05-18 MED ORDER — SODIUM CHLORIDE 0.9% FLUSH
10.0000 mL | INTRAVENOUS | Status: DC | PRN
  Administered 2018-05-18: 10 mL
  Filled 2018-05-18: qty 10

## 2018-05-18 MED ORDER — DIPHENHYDRAMINE HCL 50 MG/ML IJ SOLN
25.0000 mg | Freq: Once | INTRAMUSCULAR | Status: AC
  Administered 2018-05-18: 25 mg via INTRAVENOUS
  Filled 2018-05-18: qty 1

## 2018-05-18 MED ORDER — DEXTROSE 5 % IV SOLN
20.0000 mg/m2 | Freq: Once | INTRAVENOUS | Status: AC
  Administered 2018-05-18: 41 mg via INTRAVENOUS
  Filled 2018-05-18: qty 4.1

## 2018-05-18 MED ORDER — CYANOCOBALAMIN 1000 MCG/ML IJ SOLN
1000.0000 ug | Freq: Once | INTRAMUSCULAR | Status: AC
  Administered 2018-05-18: 1000 ug via INTRAMUSCULAR
  Filled 2018-05-18: qty 1

## 2018-05-18 MED ORDER — HEPARIN SOD (PORK) LOCK FLUSH 100 UNIT/ML IV SOLN
500.0000 [IU] | Freq: Once | INTRAVENOUS | Status: AC | PRN
  Administered 2018-05-18: 500 [IU]
  Filled 2018-05-18: qty 5

## 2018-05-18 MED ORDER — SODIUM CHLORIDE 0.9 % IV SOLN
Freq: Once | INTRAVENOUS | Status: AC
  Administered 2018-05-18: 09:00:00 via INTRAVENOUS

## 2018-05-18 MED ORDER — ALTEPLASE 2 MG IJ SOLR
INTRAMUSCULAR | Status: AC
  Filled 2018-05-18: qty 2

## 2018-05-18 MED ORDER — DENOSUMAB 120 MG/1.7ML ~~LOC~~ SOLN
120.0000 mg | Freq: Once | SUBCUTANEOUS | Status: AC
  Administered 2018-05-18: 120 mg via SUBCUTANEOUS
  Filled 2018-05-18: qty 1.7

## 2018-05-18 NOTE — Progress Notes (Signed)
Labs reviewed today. They meet parameters for treatment today and will give xgeva per parameters. No new issues or complaints from pt today.   B12 , xgeva given today per orders.  Treatment given per orders. Patient tolerated it well without problems. Vitals stable and discharged home from clinic ambulatory. Follow up as scheduled.

## 2018-05-18 NOTE — Patient Instructions (Signed)
[facility] Discharge Instructions for Patients Receiving Chemotherapy  Today you received the following chemotherapy agents   To help prevent nausea and vomiting after your treatment, we encourage you to take your nausea medication   If you develop nausea and vomiting that is not controlled by your nausea medication, call the clinic.   BELOW ARE SYMPTOMS THAT SHOULD BE REPORTED IMMEDIATELY:  *FEVER GREATER THAN 100.5 F  *CHILLS WITH OR WITHOUT FEVER  NAUSEA AND VOMITING THAT IS NOT CONTROLLED WITH YOUR NAUSEA MEDICATION  *UNUSUAL SHORTNESS OF BREATH  *UNUSUAL BRUISING OR BLEEDING  TENDERNESS IN MOUTH AND THROAT WITH OR WITHOUT PRESENCE OF ULCERS  *URINARY PROBLEMS  *BOWEL PROBLEMS  UNUSUAL RASH Items with * indicate a potential emergency and should be followed up as soon as possible.  Feel free to call the clinic should you have any questions or concerns. The clinic phone number is [phone].  Please show the CHEMO ALERT CARD at check-in to the Emergency Department and triage nurse.   

## 2018-06-08 ENCOUNTER — Inpatient Hospital Stay (HOSPITAL_COMMUNITY): Payer: Medicare HMO | Attending: Hematology

## 2018-06-08 ENCOUNTER — Other Ambulatory Visit: Payer: Self-pay

## 2018-06-08 ENCOUNTER — Other Ambulatory Visit (HOSPITAL_COMMUNITY): Payer: Medicare Other

## 2018-06-08 ENCOUNTER — Ambulatory Visit (HOSPITAL_COMMUNITY): Payer: Medicare Other | Admitting: Hematology

## 2018-06-08 ENCOUNTER — Inpatient Hospital Stay (HOSPITAL_COMMUNITY): Payer: Medicare HMO

## 2018-06-08 ENCOUNTER — Inpatient Hospital Stay (HOSPITAL_BASED_OUTPATIENT_CLINIC_OR_DEPARTMENT_OTHER): Payer: Medicare HMO | Admitting: Hematology

## 2018-06-08 ENCOUNTER — Ambulatory Visit (HOSPITAL_COMMUNITY): Payer: Medicare Other

## 2018-06-08 ENCOUNTER — Encounter (HOSPITAL_COMMUNITY): Payer: Self-pay | Admitting: Hematology

## 2018-06-08 VITALS — BP 105/67 | HR 58 | Temp 98.3°F | Resp 18

## 2018-06-08 DIAGNOSIS — C7951 Secondary malignant neoplasm of bone: Secondary | ICD-10-CM | POA: Diagnosis not present

## 2018-06-08 DIAGNOSIS — Z5111 Encounter for antineoplastic chemotherapy: Secondary | ICD-10-CM | POA: Insufficient documentation

## 2018-06-08 DIAGNOSIS — N189 Chronic kidney disease, unspecified: Secondary | ICD-10-CM | POA: Diagnosis not present

## 2018-06-08 DIAGNOSIS — C61 Malignant neoplasm of prostate: Secondary | ICD-10-CM

## 2018-06-08 LAB — COMPREHENSIVE METABOLIC PANEL
ALK PHOS: 58 U/L (ref 38–126)
ALT: 21 U/L (ref 0–44)
ANION GAP: 9 (ref 5–15)
AST: 25 U/L (ref 15–41)
Albumin: 3.9 g/dL (ref 3.5–5.0)
BILIRUBIN TOTAL: 0.4 mg/dL (ref 0.3–1.2)
BUN: 17 mg/dL (ref 8–23)
CO2: 22 mmol/L (ref 22–32)
Calcium: 9.6 mg/dL (ref 8.9–10.3)
Chloride: 105 mmol/L (ref 98–111)
Creatinine, Ser: 1.62 mg/dL — ABNORMAL HIGH (ref 0.61–1.24)
GFR calc Af Amer: 49 mL/min — ABNORMAL LOW (ref >60)
GFR calc non Af Amer: 42 mL/min — ABNORMAL LOW (ref >60)
GLUCOSE: 95 mg/dL (ref 70–99)
Potassium: 4.6 mmol/L (ref 3.5–5.1)
Sodium: 136 mmol/L (ref 135–145)
TOTAL PROTEIN: 7.8 g/dL (ref 6.5–8.1)

## 2018-06-08 LAB — CBC WITH DIFFERENTIAL/PLATELET
Abs Immature Granulocytes: 0.01 10*3/uL (ref 0.00–0.07)
Basophils Absolute: 0 10*3/uL (ref 0.0–0.1)
Basophils Relative: 0 %
EOS PCT: 1 %
Eosinophils Absolute: 0.1 10*3/uL (ref 0.0–0.5)
HCT: 35 % — ABNORMAL LOW (ref 39.0–52.0)
HEMOGLOBIN: 10.7 g/dL — AB (ref 13.0–17.0)
Immature Granulocytes: 0 %
LYMPHS ABS: 1.7 10*3/uL (ref 0.7–4.0)
Lymphocytes Relative: 25 %
MCH: 28.8 pg (ref 26.0–34.0)
MCHC: 30.6 g/dL (ref 30.0–36.0)
MCV: 94.1 fL (ref 80.0–100.0)
Monocytes Absolute: 1 10*3/uL (ref 0.1–1.0)
Monocytes Relative: 15 %
NRBC: 0 % (ref 0.0–0.2)
Neutro Abs: 3.9 10*3/uL (ref 1.7–7.7)
Neutrophils Relative %: 59 %
Platelets: 323 10*3/uL (ref 150–400)
RBC: 3.72 MIL/uL — ABNORMAL LOW (ref 4.22–5.81)
RDW: 17.3 % — ABNORMAL HIGH (ref 11.5–15.5)
WBC: 6.6 10*3/uL (ref 4.0–10.5)

## 2018-06-08 LAB — PSA: Prostatic Specific Antigen: 0.85 ng/mL (ref 0.00–4.00)

## 2018-06-08 MED ORDER — HEPARIN SOD (PORK) LOCK FLUSH 100 UNIT/ML IV SOLN
500.0000 [IU] | Freq: Once | INTRAVENOUS | Status: AC | PRN
  Administered 2018-06-08: 500 [IU]
  Filled 2018-06-08: qty 5

## 2018-06-08 MED ORDER — SODIUM CHLORIDE 0.9% FLUSH
10.0000 mL | INTRAVENOUS | Status: DC | PRN
  Administered 2018-06-08: 10 mL
  Filled 2018-06-08: qty 10

## 2018-06-08 MED ORDER — DIPHENHYDRAMINE HCL 50 MG/ML IJ SOLN
25.0000 mg | Freq: Once | INTRAMUSCULAR | Status: AC
  Administered 2018-06-08: 25 mg via INTRAVENOUS
  Filled 2018-06-08: qty 1

## 2018-06-08 MED ORDER — SODIUM CHLORIDE 0.9 % IV SOLN
10.0000 mg | Freq: Once | INTRAVENOUS | Status: AC
  Administered 2018-06-08: 10 mg via INTRAVENOUS
  Filled 2018-06-08: qty 10

## 2018-06-08 MED ORDER — DEXTROSE 5 % IV SOLN
20.0000 mg/m2 | Freq: Once | INTRAVENOUS | Status: AC
  Administered 2018-06-08: 41 mg via INTRAVENOUS
  Filled 2018-06-08: qty 4.1

## 2018-06-08 MED ORDER — SODIUM CHLORIDE 0.9 % IV SOLN
Freq: Once | INTRAVENOUS | Status: AC
  Administered 2018-06-08: 10:00:00 via INTRAVENOUS

## 2018-06-08 MED ORDER — FAMOTIDINE IN NACL 20-0.9 MG/50ML-% IV SOLN
20.0000 mg | Freq: Once | INTRAVENOUS | Status: AC
  Administered 2018-06-08: 20 mg via INTRAVENOUS
  Filled 2018-06-08: qty 50

## 2018-06-08 MED ORDER — PEGFILGRASTIM 6 MG/0.6ML ~~LOC~~ PSKT
6.0000 mg | PREFILLED_SYRINGE | Freq: Once | SUBCUTANEOUS | Status: AC
  Administered 2018-06-08: 6 mg via SUBCUTANEOUS
  Filled 2018-06-08: qty 0.6

## 2018-06-08 NOTE — Patient Instructions (Signed)
[facility] at [facility] Discharge Instructions     Thank you for choosing [facility] at [facility] to provide your oncology and hematology care.  To afford each patient quality time with our provider, please arrive at least 15 minutes before your scheduled appointment time.   If you have a lab appointment with the Cancer Center please come in thru the  Main Entrance and check in at the main information desk  You need to re-schedule your appointment should you arrive 10 or more minutes late.  We strive to give you quality time with our providers, and arriving late affects you and other patients whose appointments are after yours.  Also, if you no show three or more times for appointments you may be dismissed from the clinic at the providers discretion.     Again, thank you for choosing [facility].  Our hope is that these requests will decrease the amount of time that you wait before being seen by our physicians.       _____________________________________________________________  Should you have questions after your visit to  Cancer Center, please contact our office at [phone] between the hours of 8:00 a.m. and 4:30 p.m.  Voicemails left after 4:00 p.m. will not be returned until the following business day.  For prescription refill requests, have your pharmacy contact our office and allow 72 hours.    Cancer Center Support Programs:   > Cancer Support Group  2nd Tuesday of the month 1pm-2pm, Journey Room    

## 2018-06-08 NOTE — Progress Notes (Signed)
[place], [place] [address]   CLINIC:  Medical Oncology/Hematology  PCP:  Holley Bouche, NP (Inactive) No address on file None   REASON FOR VISIT: Follow-up for metastatic prostate cancer  CURRENT THERAPY: cabazitaxel every 3 weeks  BRIEF ONCOLOGIC HISTORY:    Malignant neoplasm of prostate ([place])   01/26/2014 Tumor Marker    PSA > 5000    01/28/2014 Initial Biopsy    Metastatic adenocarcinoma of prostate    02/05/2014 Surgery    Bilateral orchiectomy by Dr. Junious Silk    02/26/2014 Tumor Marker    PSA = 399.5    03/14/2014 Tumor Marker    PSA= 89.51    03/14/2014 - 06/26/2014 Chemotherapy    Docetaxel 75 mg/kg every 21 days x 6 cycles    04/24/2014 Tumor Marker    PSA- 19.89    07/23/2014 Procedure    Nephrostomy tube removed by IR, Dr. Geroge Baseman    07/24/2014 Tumor Marker    PSA= 6.15    10/16/2014 Tumor Marker    PSA: 3.54     01/08/2015 Tumor Marker    PSA: 2.13     01/17/2015 Imaging    CT CAP-  Massive pelvic and retroperitoneal lymphadenopathy noted on the prior study has nearly completely resolved, now with only a small amount of residual amorphous soft tissue predominantly around the the infrarenal abdominal aorta.    01/17/2015 Imaging    Bone scan- Widespread osseous metastatic disease with multiple foci of increased activity throughout the skeleton, corresponding with the findings on the prior CT from October, 2015.    04/11/2015 Tumor Marker    PSA: 1.87     07/21/2015 Imaging    Bone scan- Bony metastatic disease again noted at multiple sites, stable from prior study. No progression of bony metastatic disease is demonstrable on this study.    07/25/2015 Imaging    CT CAP- Stable matted soft tissue density in the retroperitoneum and extraperitoneal pelvis consistent with treated disease. No recurrent lymphadenopathy.  No pulmonary metastatic disease. Diffuse stable sclerotic metastatic bone disease.    01/14/2016 Imaging    Bone scan-  Multiple sites of abnormal increased tracer localization involving BILATERAL ribs, T11, T12, questionably RIGHT scapula and LEFT iliac bone suspicious for osseous metastases.    01/23/2016 Imaging    CT CAP- 1. Similar widespread osseous metastasis. 2. Similar soft tissue thickening within the retroperitoneum of the abdomen. Improved left pelvic side wall soft tissue thickening. These are consistent with sites of treated disease. No well-defined adenopathy. 3. No new sites of disease.    05/03/2016 -  Chemotherapy    Jevtana every 21 days     05/03/2016 -  Chemotherapy    The patient had pegfilgrastim (NEULASTA) injection 6 mg, 6 mg, Subcutaneous,  Once, 1 of 1 cycle Administration: 6 mg (05/05/2016) pegfilgrastim (NEULASTA ONPRO KIT) injection 6 mg, 6 mg, Subcutaneous, Once, 29 of 32 cycles Administration: 6 mg (05/24/2016), 6 mg (06/14/2016), 6 mg (07/07/2016), 6 mg (07/28/2016), 6 mg (08/18/2016), 6 mg (09/08/2016), 6 mg (09/29/2016), 6 mg (10/20/2016), 6 mg (11/10/2016), 6 mg (02/14/2017), 6 mg (03/07/2017), 6 mg (03/28/2017), 6 mg (04/20/2017), 6 mg (05/11/2017), 6 mg (06/27/2017), 6 mg (08/30/2017), 6 mg (09/20/2017), 6 mg (10/11/2017), 6 mg (11/02/2017), 6 mg (08/09/2017), 6 mg (11/23/2017), 6 mg (12/14/2017), 6 mg (01/09/2018), 6 mg (01/31/2018), 6 mg (04/04/2018), 6 mg (04/27/2018), 6 mg (05/18/2018) cabazitaxel (JEVTANA) 41 mg in dextrose 5 % 250 mL chemo  infusion, 20 mg/m2 = 41 mg (100 % of original dose 20 mg/m2), Intravenous,  Once, 30 of 33 cycles Dose modification: 20 mg/m2 (original dose 20 mg/m2, Cycle 1, Reason: Dose not tolerated, Comment: recommended by Data to use 48m/m) Administration: 41 mg (05/03/2016), 41 mg (05/24/2016), 41 mg (06/14/2016), 41 mg (07/07/2016), 41 mg (07/28/2016), 41 mg (08/18/2016), 41 mg (09/08/2016), 41 mg (09/29/2016), 41 mg (10/20/2016), 41 mg (11/10/2016), 41 mg (02/14/2017), 41 mg (03/07/2017), 41 mg (03/28/2017), 41 mg (04/20/2017), 41 mg (05/11/2017), 41 mg  (06/27/2017), 41 mg (08/30/2017), 41 mg (09/20/2017), 41 mg (10/11/2017), 41 mg (11/02/2017), 41 mg (08/09/2017), 41 mg (11/23/2017), 41 mg (12/14/2017), 41 mg (01/09/2018), 41 mg (01/31/2018), 41 mg (02/21/2018), 41 mg (04/04/2018), 41 mg (04/27/2018), 41 mg (05/18/2018)  for chemotherapy treatment.     09/06/2016 Imaging    Restaging CT C/A/P: IMPRESSION: 1. Similar appearance of widespread sclerotic bone metastases. 2. No change in soft tissue thickening within the retroperitoneum and left pelvic sidewall. No well defined adenopathy identified. 3. No new sites of disease 4. Aortic Atherosclerosis (ICD10-I70.0). Coronary artery calcifications noted. 5. 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 6. Stable 9 mm pancreatic cystic lesion. Favor pseudocyst. Indolent neoplasm may look similar. Attention on follow-up imaging.    09/06/2016 Imaging    Bone Scan: IMPRESSION: In this patient with known diffuse sclerotic metastatic disease, bone scan does not reveal progression of radiotracer uptake. Several of the areas of previously demonstrated radiotracer uptake appear less prominent.  Change in appearance of radiotracer uptake involving the mandible now more notable on the right and previously more notable on the left may reflect result of dental disease rather than metastatic disease.     04/08/2017 Tumor Marker    PSA 0.89     INTERVAL HISTORY:  [name]89y.o. male returns for routine follow-up for metastatic prostate cancer. He is tolerating treatment well. He has no complaints at this time. He is ready for his next treatment. Denies any nausea, vomiting, or diarrhea. Denies any new pains. Had not noticed any recent bleeding such as epistaxis, hematuria or hematochezia. Denies recent chest pain on exertion, shortness of breath on minimal exertion, pre-syncopal episodes, or palpitations. Denies any numbness or tingling in hands or  feet. Denies any recent fevers, infections, or recent hospitalizations. Patient reports appetite at 100% and energy level at 100%. He is eating well and having no problem maintaining his weight at this time.    REVIEW OF SYSTEMS:  Review of Systems  Psychiatric/Behavioral: Positive for sleep disturbance.  All other systems reviewed and are negative.    PAST MEDICAL/SURGICAL HISTORY:  Past Medical History:  Diagnosis Date  . Acute renal failure ([place] 01/26/2014  . Alcohol abuse   . Anemia of chronic disease 01/26/2014  . Bilateral hydronephrosis 01/26/2014  . History of cocaine abuse ([facility] 01/26/2014  . Homelessness 01/31/2014  . Prostate cancer metastatic to multiple sites ([facility] 01/30/2014  . Sepsis ([facility]   . UTI (lower urinary tract infection)    Past Surgical History:  Procedure Laterality Date  . ORCHIECTOMY Bilateral 02/05/2014   Procedure: BILATERAL ORCHIECTOMY;  Surgeon: MFestus Aloe MD;  Location: [facility];  Service: Urology;  Laterality: Bilateral;  . PERCUTANEOUS NEPHROSTOMY Bilateral    IR Dr. EJunious Silkchanged on 04/22/2014  . PORT-A-CATH REMOVAL Right 08/08/2017   Procedure: REMOVAL PORT-A-CATH RIGHT CHEST (PROCEDURE #2);  Surgeon: JAviva Signs MD;  Location: [facility];  Service: General;  Laterality: Right;  . PORTACATH  PLACEMENT Right 03/12/14  . PORTACATH PLACEMENT Left 08/08/2017   Procedure: INSERTION POWER PORT WITH ATTACHED CATHETER LEFT SUBCLAVIAN (PROCEDURE #1);  Surgeon: Aviva Signs, MD;  Location: [facility];  Service: General;  Laterality: Left;     SOCIAL HISTORY:  Social History   Socioeconomic History  . Marital status: Single    Spouse name: Not on file  . Number of children: 1  . Years of education: 9th  . Highest education level: Not on file  Occupational History  . Occupation: disabilty    Employer: [employer]  . Financial resource strain: Not on file  . Food insecurity:    Worry: Not on file    Inability: Not on file  .  Transportation needs:    Medical: Not on file    Non-medical: Not on file  Tobacco Use  . Smoking status: Current Every Day Smoker    Packs/day: 0.25    Years: 55.00    Pack years: 13.75    Types: Cigarettes  . Smokeless tobacco: Never Used  Substance and Sexual Activity  . Alcohol use: Yes    Alcohol/week: 1.0 standard drinks    Types: 1 Cans of beer per week  . Drug use: Yes    Types: Cocaine    Comment: last used Cocaine 01/25/14  . Sexual activity: Yes  Lifestyle  . Physical activity:    Days per week: Not on file    Minutes per session: Not on file  . Stress: Not on file  Relationships  . Social connections:    Talks on phone: Not on file    Gets together: Not on file    Attends religious service: Not on file    Active member of club or organization: Not on file    Attends meetings of clubs or organizations: Not on file    Relationship status: Not on file  . Intimate partner violence:    Fear of current or ex partner: Not on file    Emotionally abused: Not on file    Physically abused: Not on file    Forced sexual activity: Not on file  Other Topics Concern  . Not on file  Social History Narrative   Lives at [employer] 9th grade   1 son, lives in [place], [place]   Can read and write in native language             FAMILY HISTORY:  Family History  Problem Relation Age of Onset  . Cancer Brother 76    CURRENT MEDICATIONS:  Outpatient Encounter Medications as of 06/08/2018  Medication Sig Note  . calcium gluconate 500 MG tablet Take 1 tablet by mouth 2 (two) times daily.   . prochlorperazine (COMPAZINE) 10 MG tablet The day after chemo take 1 tab four times a day x 2 days. Then may take 1 tab four times a day if needed for nausea/vomiting. 07/24/2014: Just takes as needed  . [DISCONTINUED] metoCLOPramide (REGLAN) 5 MG tablet The day after chemo take 1 tab four times a day x 2 days. Then may take 1 tab four times a day if needed for  nausea/vomiting. 07/24/2014: Just takes as needed   Facility-Administered Encounter Medications as of 06/08/2018  Medication  . cyanocobalamin ((VITAMIN B-12)) injection 1,000 mcg  . denosumab (XGEVA) injection 120 mg    ALLERGIES:  No Known Allergies   PHYSICAL EXAM:  ECOG Performance status: 1  Vitals:   06/08/18 0859  BP: 109/76  Pulse: 74  Resp: 18  SpO2: 100%   Filed Weights   06/08/18 0859  Weight: 167 lb 6.4 oz (75.9 kg)    Physical Exam Constitutional:      Appearance: Normal appearance. He is normal weight.  Cardiovascular:     Rate and Rhythm: Normal rate and regular rhythm.     Heart sounds: Normal heart sounds.  Pulmonary:     Effort: Pulmonary effort is normal.     Breath sounds: Normal breath sounds.  Musculoskeletal: Normal range of motion.  Skin:    General: Skin is warm and dry.  Neurological:     Mental Status: He is alert and oriented to person, place, and time. Mental status is at baseline.  Psychiatric:        Mood and Affect: Mood normal.        Behavior: Behavior normal.        Thought Content: Thought content normal.        Judgment: Judgment normal.      LABORATORY DATA:  I have reviewed the labs as listed.  CBC    Component Value Date/Time   WBC 6.6 06/08/2018 0831   RBC 3.72 (L) 06/08/2018 0831   HGB 10.7 (L) 06/08/2018 0831   HCT 35.0 (L) 06/08/2018 0831   PLT 323 06/08/2018 0831   MCV 94.1 06/08/2018 0831   MCH 28.8 06/08/2018 0831   MCHC 30.6 06/08/2018 0831   RDW 17.3 (H) 06/08/2018 0831   LYMPHSABS 1.7 06/08/2018 0831   MONOABS 1.0 06/08/2018 0831   EOSABS 0.1 06/08/2018 0831   BASOSABS 0.0 06/08/2018 0831   CMP Latest Ref Rng & Units 06/08/2018 05/18/2018 04/27/2018  Glucose 70 - 99 mg/dL 95 94 105(H)  BUN 8 - 23 mg/dL _0 Creatinine 0.61 - 1.24 mg/dL 1.62(H) 1.46(H) 1.64(H)  Sodium 135 - 145 mmol/L 136 137 139  Potassium 3.5 - 5.1 mmol/L 4.6 4.5 4.3  Chloride 98 - 111 mmol/L 105 108 103  CO2 22 - 32 mmol/L  _1 Calcium 8.9 - 10.3 mg/dL 9.6 8.8(L) 9.7  Total Protein 6.5 - 8.1 g/dL 7.8 7.6 7.8  Total Bilirubin 0.3 - 1.2 mg/dL 0.4 0.2(L) 0.4  Alkaline Phos 38 - 126 U/L 58 57 54  AST 15 - 41 U/L _2 ALT 0 - 44 U/L _3 DIAGNOSTIC IMAGING:  I have independently reviewed the scans and discussed with the patient.   I have reviewed Francene Finders, NP's note and agree with the documentation.  I personally performed a face-to-face visit, made revisions and my assessment and plan is as follows.    ASSESSMENT & PLAN:   Malignant neoplasm of prostate ([place]) 1.  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   -Last bone scan on 07/25/2017 showed mixed response compared to prior scan on 09/06/2016.  Last CT scan was on 12/20/2016 which did not show any visceral metastasis but showed pulmonary fibrotic changes. - He is tolerating cabazitaxel exceptionally well.  He completed cycle 30 on 05/18/2018. -He denies any peripheral neuropathy.  No other chemo related side effects. -Last PSA was 0.87 on 04/04/2018.  PSA from today is pending. -If the PSA continues to trend up, will consider doing scans.  He will continue monthly cabazitaxel.  We will see him back in 2 months.  2.  Bone metastasis: - He will continue monthly denosumab.  He will continue calcium supplements.   3.  CKD: -Creatinine is 1.6 and stable.      Orders placed this encounter:  No orders of the defined types were placed in this encounter.     Derek Jack, MD [place] [phone]

## 2018-06-08 NOTE — Patient Instructions (Signed)
[facility] Discharge Instructions for Patients Receiving Chemotherapy  Today you received the following chemotherapy agents   To help prevent nausea and vomiting after your treatment, we encourage you to take your nausea medication   If you develop nausea and vomiting that is not controlled by your nausea medication, call the clinic.   BELOW ARE SYMPTOMS THAT SHOULD BE REPORTED IMMEDIATELY:  *FEVER GREATER THAN 100.5 F  *CHILLS WITH OR WITHOUT FEVER  NAUSEA AND VOMITING THAT IS NOT CONTROLLED WITH YOUR NAUSEA MEDICATION  *UNUSUAL SHORTNESS OF BREATH  *UNUSUAL BRUISING OR BLEEDING  TENDERNESS IN MOUTH AND THROAT WITH OR WITHOUT PRESENCE OF ULCERS  *URINARY PROBLEMS  *BOWEL PROBLEMS  UNUSUAL RASH Items with * indicate a potential emergency and should be followed up as soon as possible.  Feel free to call the clinic should you have any questions or concerns. The clinic phone number is [phone].  Please show the CHEMO ALERT CARD at check-in to the Emergency Department and triage nurse.   

## 2018-06-08 NOTE — Assessment & Plan Note (Signed)
1.  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   -Last bone scan on 07/25/2017 showed mixed response compared to prior scan on 09/06/2016.  Last CT scan was on 12/20/2016 which did not show any visceral metastasis but showed pulmonary fibrotic changes. - He is tolerating cabazitaxel exceptionally well.  He completed cycle 30 on 05/18/2018. -He denies any peripheral neuropathy.  No other chemo related side effects. -Last PSA was 0.87 on 04/04/2018.  PSA from today is pending. -If the PSA continues to trend up, will consider doing scans.  He will continue monthly cabazitaxel.  We will see him back in 2 months.  2.  Bone metastasis: - He will continue monthly denosumab.  He will continue calcium supplements.   3.  CKD: -Creatinine is 1.6 and stable.

## 2018-06-08 NOTE — Progress Notes (Signed)
Labs reviewed with Dr. Delton Coombes today. Creatinine 1.62, MD aware. Will proceed with treatment today per MD.   .Jeff [name] arrived today for [facility] neulasta on body injector. See MAR for administration details. Injector in place and engaged with green light indicator on flashing. Tolerated application with out problems.  Treatment given per orders. Patient tolerated it well without problems. Vitals stable and discharged home from clinic ambulatory. Follow up as scheduled.

## 2018-06-21 ENCOUNTER — Encounter (HOSPITAL_COMMUNITY): Payer: Self-pay

## 2018-06-21 ENCOUNTER — Inpatient Hospital Stay (HOSPITAL_COMMUNITY): Payer: Medicare HMO

## 2018-06-21 ENCOUNTER — Other Ambulatory Visit: Payer: Self-pay

## 2018-06-21 VITALS — BP 120/75 | HR 77 | Temp 98.6°F | Resp 16

## 2018-06-21 DIAGNOSIS — N189 Chronic kidney disease, unspecified: Secondary | ICD-10-CM | POA: Diagnosis not present

## 2018-06-21 DIAGNOSIS — Z5111 Encounter for antineoplastic chemotherapy: Secondary | ICD-10-CM | POA: Diagnosis not present

## 2018-06-21 DIAGNOSIS — E538 Deficiency of other specified B group vitamins: Secondary | ICD-10-CM

## 2018-06-21 DIAGNOSIS — C61 Malignant neoplasm of prostate: Secondary | ICD-10-CM | POA: Diagnosis not present

## 2018-06-21 DIAGNOSIS — M899 Disorder of bone, unspecified: Secondary | ICD-10-CM

## 2018-06-21 DIAGNOSIS — C7951 Secondary malignant neoplasm of bone: Secondary | ICD-10-CM | POA: Diagnosis not present

## 2018-06-21 MED ORDER — CYANOCOBALAMIN 1000 MCG/ML IJ SOLN
INTRAMUSCULAR | Status: AC
  Filled 2018-06-21: qty 1

## 2018-06-21 MED ORDER — CYANOCOBALAMIN 1000 MCG/ML IJ SOLN
1000.0000 ug | Freq: Once | INTRAMUSCULAR | Status: AC
  Administered 2018-06-21: 1000 ug via INTRAMUSCULAR

## 2018-06-21 MED ORDER — DENOSUMAB 120 MG/1.7ML ~~LOC~~ SOLN
120.0000 mg | Freq: Once | SUBCUTANEOUS | Status: AC
  Administered 2018-06-21: 120 mg via SUBCUTANEOUS
  Filled 2018-06-21: qty 1.7

## 2018-06-21 NOTE — Progress Notes (Signed)
Jeff [name] presents today for injection per MD orders. B12  administered IM in right deltoid. Administration without incident. Patient tolerated well.  Jeff [name] presents today for injection per MD orders. XGeva administered SQ in left  Upper Arm. Administration without incident. Patient tolerated well.   Discharged from clinic ambulatory. F/U with [facility] as scheduled.

## 2018-06-21 NOTE — Patient Instructions (Signed)
[facility]  Discharge Instructions:   _______________________________________________________________  Thank you for choosing [facility] to provide your oncology and hematology care.  To afford each patient quality time with our providers, please arrive at least 15 minutes before your scheduled appointment.  You need to re-schedule your appointment if you arrive 10 or more minutes late.  We strive to give you quality time with our providers, and arriving late affects you and other patients whose appointments are after yours.  Also, if you no show three or more times for appointments you may be dismissed from the clinic.  Again, thank you for choosing  Cancer Center at [facility]. Our hope is that these requests will allow you access to exceptional care and in a timely manner. _______________________________________________________________  If you have questions after your visit, please contact our office at [phone] between the hours of 8:30 a.m. and 5:00 p.m. Voicemails left after 4:30 p.m. will not be returned until the following business day. _______________________________________________________________  For prescription refill requests, have your pharmacy contact our office. _______________________________________________________________  Recommendations made by the consultant and any test results will be sent to your referring physician. _______________________________________________________________ 

## 2018-06-29 ENCOUNTER — Other Ambulatory Visit: Payer: Self-pay

## 2018-06-29 ENCOUNTER — Inpatient Hospital Stay (HOSPITAL_COMMUNITY): Payer: Medicare HMO

## 2018-06-29 ENCOUNTER — Encounter (HOSPITAL_COMMUNITY): Payer: Self-pay

## 2018-06-29 ENCOUNTER — Inpatient Hospital Stay (HOSPITAL_COMMUNITY): Payer: Medicare HMO | Attending: Hematology

## 2018-06-29 VITALS — BP 113/70 | HR 71 | Temp 98.0°F | Resp 18 | Wt 164.8 lb

## 2018-06-29 DIAGNOSIS — Z5189 Encounter for other specified aftercare: Secondary | ICD-10-CM | POA: Insufficient documentation

## 2018-06-29 DIAGNOSIS — Z5111 Encounter for antineoplastic chemotherapy: Secondary | ICD-10-CM | POA: Insufficient documentation

## 2018-06-29 DIAGNOSIS — C61 Malignant neoplasm of prostate: Secondary | ICD-10-CM | POA: Diagnosis not present

## 2018-06-29 DIAGNOSIS — C7951 Secondary malignant neoplasm of bone: Secondary | ICD-10-CM | POA: Diagnosis not present

## 2018-06-29 LAB — COMPREHENSIVE METABOLIC PANEL
ALBUMIN: 4.3 g/dL (ref 3.5–5.0)
ALT: 16 U/L (ref 0–44)
AST: 31 U/L (ref 15–41)
Alkaline Phosphatase: 63 U/L (ref 38–126)
Anion gap: 7 (ref 5–15)
BUN: 22 mg/dL (ref 8–23)
CO2: 22 mmol/L (ref 22–32)
Calcium: 8.5 mg/dL — ABNORMAL LOW (ref 8.9–10.3)
Chloride: 106 mmol/L (ref 98–111)
Creatinine, Ser: 2.15 mg/dL — ABNORMAL HIGH (ref 0.61–1.24)
GFR calc Af Amer: 35 mL/min — ABNORMAL LOW (ref >60)
GFR calc non Af Amer: 30 mL/min — ABNORMAL LOW (ref >60)
Glucose, Bld: 90 mg/dL (ref 70–99)
POTASSIUM: 4.7 mmol/L (ref 3.5–5.1)
Sodium: 135 mmol/L (ref 135–145)
Total Bilirubin: 0.5 mg/dL (ref 0.3–1.2)
Total Protein: 8.1 g/dL (ref 6.5–8.1)

## 2018-06-29 LAB — CBC WITH DIFFERENTIAL/PLATELET
Abs Immature Granulocytes: 0.01 10*3/uL (ref 0.00–0.07)
BASOS ABS: 0 10*3/uL (ref 0.0–0.1)
Basophils Relative: 0 %
Eosinophils Absolute: 0 10*3/uL (ref 0.0–0.5)
Eosinophils Relative: 1 %
HCT: 36.1 % — ABNORMAL LOW (ref 39.0–52.0)
Hemoglobin: 11.2 g/dL — ABNORMAL LOW (ref 13.0–17.0)
IMMATURE GRANULOCYTES: 0 %
LYMPHS ABS: 1.9 10*3/uL (ref 0.7–4.0)
Lymphocytes Relative: 30 %
MCH: 29.7 pg (ref 26.0–34.0)
MCHC: 31 g/dL (ref 30.0–36.0)
MCV: 95.8 fL (ref 80.0–100.0)
Monocytes Absolute: 0.7 10*3/uL (ref 0.1–1.0)
Monocytes Relative: 10 %
NEUTROS PCT: 59 %
NRBC: 0 % (ref 0.0–0.2)
Neutro Abs: 3.8 10*3/uL (ref 1.7–7.7)
Platelets: 294 10*3/uL (ref 150–400)
RBC: 3.77 MIL/uL — ABNORMAL LOW (ref 4.22–5.81)
RDW: 17.7 % — ABNORMAL HIGH (ref 11.5–15.5)
WBC: 6.5 10*3/uL (ref 4.0–10.5)

## 2018-06-29 MED ORDER — HEPARIN SOD (PORK) LOCK FLUSH 100 UNIT/ML IV SOLN
500.0000 [IU] | Freq: Once | INTRAVENOUS | Status: AC | PRN
  Administered 2018-06-29: 500 [IU]
  Filled 2018-06-29: qty 5

## 2018-06-29 MED ORDER — DEXTROSE 5 % IV SOLN
20.0000 mg/m2 | Freq: Once | INTRAVENOUS | Status: AC
  Administered 2018-06-29: 41 mg via INTRAVENOUS
  Filled 2018-06-29: qty 4.1

## 2018-06-29 MED ORDER — PEGFILGRASTIM 6 MG/0.6ML ~~LOC~~ PSKT
6.0000 mg | PREFILLED_SYRINGE | Freq: Once | SUBCUTANEOUS | Status: AC
  Administered 2018-06-29: 6 mg via SUBCUTANEOUS
  Filled 2018-06-29: qty 0.6

## 2018-06-29 MED ORDER — DIPHENHYDRAMINE HCL 50 MG/ML IJ SOLN
25.0000 mg | Freq: Once | INTRAMUSCULAR | Status: AC
  Administered 2018-06-29: 25 mg via INTRAVENOUS
  Filled 2018-06-29: qty 1

## 2018-06-29 MED ORDER — SODIUM CHLORIDE 0.9% FLUSH
10.0000 mL | INTRAVENOUS | Status: DC | PRN
  Administered 2018-06-29: 10 mL
  Filled 2018-06-29: qty 10

## 2018-06-29 MED ORDER — SODIUM CHLORIDE 0.9 % IV SOLN
10.0000 mg | Freq: Once | INTRAVENOUS | Status: AC
  Administered 2018-06-29: 10 mg via INTRAVENOUS
  Filled 2018-06-29: qty 1

## 2018-06-29 MED ORDER — SODIUM CHLORIDE 0.9 % IV SOLN
INTRAVENOUS | Status: AC
  Administered 2018-06-29: 09:00:00 via INTRAVENOUS

## 2018-06-29 MED ORDER — FAMOTIDINE IN NACL 20-0.9 MG/50ML-% IV SOLN
20.0000 mg | Freq: Once | INTRAVENOUS | Status: AC
  Administered 2018-06-29: 20 mg via INTRAVENOUS
  Filled 2018-06-29: qty 50

## 2018-06-29 MED ORDER — SODIUM CHLORIDE 0.9 % IV SOLN
Freq: Once | INTRAVENOUS | Status: AC
  Administered 2018-06-29: 11:00:00 via INTRAVENOUS

## 2018-06-29 NOTE — Progress Notes (Signed)
0918 Lab results,including Creatinine 2.15, reviewed with Dr. Delton Coombes and pt approved for Jevtana infusion today with hydration of NS 1 Liter over 2 hours added per MD order.                                                         Jeff [name] tolerated Jevtana infusion with Neulasta on-pro well without complaints or incident. VSS upon discharge. Neulasta on-pro applied to pt's left arm with green indicator light flashing upon discharge. Pt discharged self ambulatory in satisfactory condition

## 2018-06-29 NOTE — Patient Instructions (Addendum)
[facility] Discharge Instructions for Patients Receiving Chemotherapy   Beginning January 23rd 2017 lab work for the [facility] will be done in the  Main lab at [facility] on 1st floor. If you have a lab appointment with the [facility] please come in thru the  Main Entrance and check in at the main information desk   Today you received the following chemotherapy agents Jevtana as well as Neulasta on-pro and hydration. Follow-up as scheduled. Call clinic for any questions or concerns  To help prevent nausea and vomiting after your treatment, we encourage you to take your nausea medication   If you develop nausea and vomiting, or diarrhea that is not controlled by your medication, call the clinic.  The clinic phone number is [phone]. Office hours are Monday-Friday 8:30am-5:00pm.  BELOW ARE SYMPTOMS THAT SHOULD BE REPORTED IMMEDIATELY:  *FEVER GREATER THAN 101.0 F  *CHILLS WITH OR WITHOUT FEVER  NAUSEA AND VOMITING THAT IS NOT CONTROLLED WITH YOUR NAUSEA MEDICATION  *UNUSUAL SHORTNESS OF BREATH  *UNUSUAL BRUISING OR BLEEDING  TENDERNESS IN MOUTH AND THROAT WITH OR WITHOUT PRESENCE OF ULCERS  *URINARY PROBLEMS  *BOWEL PROBLEMS  UNUSUAL RASH Items with * indicate a potential emergency and should be followed up as soon as possible. If you have an emergency after office hours please contact your primary care physician or go to the nearest emergency department.  Please call the clinic during office hours if you have any questions or concerns.   You may also contact the Patient Navigator at [phone] should you have any questions or need assistance in obtaining follow up care.      Resources For Cancer Patients and their Caregivers ? American Cancer Society: Can assist with transportation, wigs, general needs, runs Look Good Feel Better.        343 385 4066 ? Cancer Care: Provides financial assistance, online support groups,  medication/co-pay assistance.  1-800-813-HOPE 534 483 6051) ? [place] Assists [place] cancer patients and their families through emotional , educational and financial support.  901-776-4861 ? [place] DSS Where to apply for food stamps, Medicaid and utility assistance. 857-831-9401 ? [facility]: Transportation to medical appointments. (620) 789-2086 ? Social Security Administration: May apply for disability if have a Stage IV cancer. (908)850-6753 2363571964 ? [employer], Disability and Transit Services: Assists with nutrition, care and transit needs. (870) 129-0892

## 2018-07-03 DIAGNOSIS — E538 Deficiency of other specified B group vitamins: Secondary | ICD-10-CM | POA: Diagnosis not present

## 2018-07-04 DIAGNOSIS — E538 Deficiency of other specified B group vitamins: Secondary | ICD-10-CM | POA: Diagnosis not present

## 2018-07-05 DIAGNOSIS — E538 Deficiency of other specified B group vitamins: Secondary | ICD-10-CM | POA: Diagnosis not present

## 2018-07-06 DIAGNOSIS — E538 Deficiency of other specified B group vitamins: Secondary | ICD-10-CM | POA: Diagnosis not present

## 2018-07-07 DIAGNOSIS — E538 Deficiency of other specified B group vitamins: Secondary | ICD-10-CM | POA: Diagnosis not present

## 2018-07-10 DIAGNOSIS — E538 Deficiency of other specified B group vitamins: Secondary | ICD-10-CM | POA: Diagnosis not present

## 2018-07-11 ENCOUNTER — Encounter: Payer: Self-pay | Admitting: General Practice

## 2018-07-11 DIAGNOSIS — E538 Deficiency of other specified B group vitamins: Secondary | ICD-10-CM | POA: Diagnosis not present

## 2018-07-11 NOTE — Progress Notes (Signed)
[facility] CSW Progress Note  [employer] [facility] [place] to check on status of request for [facility] for client.  Per [place], request was received on July 03, 2018; in home assessment completed and patient was deemed eligible for in home aide services.  He has chosen to work w [employer] [facility] [employer] [place] [phone]) as provider who will supply aide services.  Spoke w patient, states he has been approved for either "42 or 52 hours a week", has not heard from [facility] yet nor has an in home aide been provided.  Encouraged patient to call [facility] and inquire about when services will start, call CSW if he runs into any difficulties.  States that he has no concerns at this time w getting food or supplies, aware of current recommendations for social distancing.  [facility] - they state they have an aide assigned and aide will be connecting w patient today.  Edwyna Shell, LCSW Clinical Social Worker Phone:  [phone]

## 2018-07-12 DIAGNOSIS — E538 Deficiency of other specified B group vitamins: Secondary | ICD-10-CM | POA: Diagnosis not present

## 2018-07-13 DIAGNOSIS — E538 Deficiency of other specified B group vitamins: Secondary | ICD-10-CM | POA: Diagnosis not present

## 2018-07-14 DIAGNOSIS — E538 Deficiency of other specified B group vitamins: Secondary | ICD-10-CM | POA: Diagnosis not present

## 2018-07-20 ENCOUNTER — Inpatient Hospital Stay (HOSPITAL_COMMUNITY): Payer: Medicare HMO

## 2018-07-20 ENCOUNTER — Other Ambulatory Visit: Payer: Self-pay

## 2018-07-20 ENCOUNTER — Encounter (HOSPITAL_COMMUNITY): Payer: Self-pay

## 2018-07-20 VITALS — BP 91/54 | HR 66 | Temp 97.5°F | Resp 16 | Wt 162.1 lb

## 2018-07-20 DIAGNOSIS — E538 Deficiency of other specified B group vitamins: Secondary | ICD-10-CM

## 2018-07-20 DIAGNOSIS — M899 Disorder of bone, unspecified: Secondary | ICD-10-CM

## 2018-07-20 DIAGNOSIS — Z5189 Encounter for other specified aftercare: Secondary | ICD-10-CM | POA: Diagnosis not present

## 2018-07-20 DIAGNOSIS — C61 Malignant neoplasm of prostate: Secondary | ICD-10-CM | POA: Diagnosis not present

## 2018-07-20 DIAGNOSIS — C7951 Secondary malignant neoplasm of bone: Secondary | ICD-10-CM | POA: Diagnosis not present

## 2018-07-20 DIAGNOSIS — Z5111 Encounter for antineoplastic chemotherapy: Secondary | ICD-10-CM | POA: Diagnosis not present

## 2018-07-20 LAB — COMPREHENSIVE METABOLIC PANEL
ALT: 16 U/L (ref 0–44)
AST: 21 U/L (ref 15–41)
Albumin: 4.3 g/dL (ref 3.5–5.0)
Alkaline Phosphatase: 67 U/L (ref 38–126)
Anion gap: 9 (ref 5–15)
BUN: 20 mg/dL (ref 8–23)
CO2: 20 mmol/L — ABNORMAL LOW (ref 22–32)
Calcium: 8.7 mg/dL — ABNORMAL LOW (ref 8.9–10.3)
Chloride: 109 mmol/L (ref 98–111)
Creatinine, Ser: 1.74 mg/dL — ABNORMAL HIGH (ref 0.61–1.24)
GFR calc Af Amer: 45 mL/min — ABNORMAL LOW (ref >60)
GFR calc non Af Amer: 39 mL/min — ABNORMAL LOW (ref >60)
Glucose, Bld: 90 mg/dL (ref 70–99)
Potassium: 5.1 mmol/L (ref 3.5–5.1)
Sodium: 138 mmol/L (ref 135–145)
Total Bilirubin: 0.4 mg/dL (ref 0.3–1.2)
Total Protein: 8.3 g/dL — ABNORMAL HIGH (ref 6.5–8.1)

## 2018-07-20 LAB — CBC WITH DIFFERENTIAL/PLATELET
Abs Immature Granulocytes: 0.04 10*3/uL (ref 0.00–0.07)
Basophils Absolute: 0 10*3/uL (ref 0.0–0.1)
Basophils Relative: 0 %
Eosinophils Absolute: 0 10*3/uL (ref 0.0–0.5)
Eosinophils Relative: 0 %
HCT: 37 % — ABNORMAL LOW (ref 39.0–52.0)
Hemoglobin: 11.7 g/dL — ABNORMAL LOW (ref 13.0–17.0)
Immature Granulocytes: 1 %
Lymphocytes Relative: 32 %
Lymphs Abs: 1.8 10*3/uL (ref 0.7–4.0)
MCH: 30.4 pg (ref 26.0–34.0)
MCHC: 31.6 g/dL (ref 30.0–36.0)
MCV: 96.1 fL (ref 80.0–100.0)
Monocytes Absolute: 0.5 10*3/uL (ref 0.1–1.0)
Monocytes Relative: 9 %
Neutro Abs: 3.2 10*3/uL (ref 1.7–7.7)
Neutrophils Relative %: 58 %
Platelets: 301 10*3/uL (ref 150–400)
RBC: 3.85 MIL/uL — ABNORMAL LOW (ref 4.22–5.81)
RDW: 17.5 % — ABNORMAL HIGH (ref 11.5–15.5)
WBC: 5.5 10*3/uL (ref 4.0–10.5)
nRBC: 0 % (ref 0.0–0.2)

## 2018-07-20 MED ORDER — HEPARIN SOD (PORK) LOCK FLUSH 100 UNIT/ML IV SOLN
500.0000 [IU] | Freq: Once | INTRAVENOUS | Status: AC | PRN
  Administered 2018-07-20: 500 [IU]

## 2018-07-20 MED ORDER — DENOSUMAB 120 MG/1.7ML ~~LOC~~ SOLN
SUBCUTANEOUS | Status: AC
  Filled 2018-07-20: qty 1.7

## 2018-07-20 MED ORDER — SODIUM CHLORIDE 0.9 % IV SOLN
Freq: Once | INTRAVENOUS | Status: AC
  Administered 2018-07-20: 12:00:00 via INTRAVENOUS

## 2018-07-20 MED ORDER — DEXTROSE 5 % IV SOLN
20.0000 mg/m2 | Freq: Once | INTRAVENOUS | Status: AC
  Administered 2018-07-20: 41 mg via INTRAVENOUS
  Filled 2018-07-20: qty 4.1

## 2018-07-20 MED ORDER — DENOSUMAB 120 MG/1.7ML ~~LOC~~ SOLN
120.0000 mg | Freq: Once | SUBCUTANEOUS | Status: AC
  Administered 2018-07-20: 120 mg via SUBCUTANEOUS
  Filled 2018-07-20: qty 1.7

## 2018-07-20 MED ORDER — FAMOTIDINE IN NACL 20-0.9 MG/50ML-% IV SOLN: 20.0000 mg | Freq: Once | INTRAVENOUS | Status: DC

## 2018-07-20 MED ORDER — DIPHENHYDRAMINE HCL 50 MG/ML IJ SOLN
25.0000 mg | Freq: Once | INTRAMUSCULAR | Status: AC
  Administered 2018-07-20: 25 mg via INTRAVENOUS
  Filled 2018-07-20: qty 1

## 2018-07-20 MED ORDER — PEGFILGRASTIM 6 MG/0.6ML ~~LOC~~ PSKT
6.0000 mg | PREFILLED_SYRINGE | Freq: Once | SUBCUTANEOUS | Status: AC
  Administered 2018-07-20: 6 mg via SUBCUTANEOUS
  Filled 2018-07-20: qty 0.6

## 2018-07-20 MED ORDER — CYANOCOBALAMIN 1000 MCG/ML IJ SOLN
1000.0000 ug | Freq: Once | INTRAMUSCULAR | Status: AC
  Administered 2018-07-20: 1000 ug via INTRAMUSCULAR
  Filled 2018-07-20: qty 1

## 2018-07-20 MED ORDER — SODIUM CHLORIDE 0.9% FLUSH
10.0000 mL | INTRAVENOUS | Status: DC | PRN
  Administered 2018-07-20: 10 mL
  Filled 2018-07-20: qty 10

## 2018-07-20 MED ORDER — SODIUM CHLORIDE 0.9 % IV SOLN
20.0000 mg | Freq: Once | INTRAVENOUS | Status: AC
  Administered 2018-07-20: 20 mg via INTRAVENOUS
  Filled 2018-07-20: qty 100

## 2018-07-20 MED ORDER — SODIUM CHLORIDE 0.9 % IV SOLN
10.0000 mg | Freq: Once | INTRAVENOUS | Status: AC
  Administered 2018-07-20: 10 mg via INTRAVENOUS
  Filled 2018-07-20: qty 10

## 2018-07-20 NOTE — Patient Instructions (Signed)
[facility] Discharge Instructions for Patients Receiving Chemotherapy  Today you received the following chemotherapy agents   To help prevent nausea and vomiting after your treatment, we encourage you to take your nausea medication   If you develop nausea and vomiting that is not controlled by your nausea medication, call the clinic.   BELOW ARE SYMPTOMS THAT SHOULD BE REPORTED IMMEDIATELY:  *FEVER GREATER THAN 100.5 F  *CHILLS WITH OR WITHOUT FEVER  NAUSEA AND VOMITING THAT IS NOT CONTROLLED WITH YOUR NAUSEA MEDICATION  *UNUSUAL SHORTNESS OF BREATH  *UNUSUAL BRUISING OR BLEEDING  TENDERNESS IN MOUTH AND THROAT WITH OR WITHOUT PRESENCE OF ULCERS  *URINARY PROBLEMS  *BOWEL PROBLEMS  UNUSUAL RASH Items with * indicate a potential emergency and should be followed up as soon as possible.  Feel free to call the clinic should you have any questions or concerns. The clinic phone number is [phone].  Please show the CHEMO ALERT CARD at check-in to the Emergency Department and triage nurse.   

## 2018-07-20 NOTE — Progress Notes (Signed)
Labs reviewed with MD. Proceed with treatment, creatine 1.74 noted by MD.   b12 and xgeva given today per orders. See MAR   1430- MD notified of patients blood pressure. Will give the rest of the fluids that are hanging per Dr. Delton Coombes. Patient is asymptomatic at this time.   BP 91/54 at 1450. Will discharge per md.  .Adriana Reams arrived today for [facility] neulasta on body injector. See MAR for administration details. Injector in place and engaged with green light indicator on flashing. Tolerated application with out problems.  Treatment given per orders. Patient tolerated it well without problems. Vitals stable and discharged home from clinic ambulatory. Follow up as scheduled.

## 2018-08-09 ENCOUNTER — Other Ambulatory Visit: Payer: Self-pay

## 2018-08-10 ENCOUNTER — Encounter (HOSPITAL_COMMUNITY): Payer: Self-pay

## 2018-08-10 ENCOUNTER — Inpatient Hospital Stay (HOSPITAL_COMMUNITY): Payer: Medicare HMO | Attending: Hematology | Admitting: Hematology

## 2018-08-10 ENCOUNTER — Inpatient Hospital Stay (HOSPITAL_COMMUNITY): Payer: Medicare HMO

## 2018-08-10 ENCOUNTER — Other Ambulatory Visit (HOSPITAL_COMMUNITY): Payer: Medicare HMO

## 2018-08-10 ENCOUNTER — Encounter (HOSPITAL_COMMUNITY): Payer: Self-pay | Admitting: Hematology

## 2018-08-10 VITALS — BP 108/71 | HR 64 | Temp 97.9°F | Resp 18

## 2018-08-10 DIAGNOSIS — Z5189 Encounter for other specified aftercare: Secondary | ICD-10-CM | POA: Insufficient documentation

## 2018-08-10 DIAGNOSIS — N189 Chronic kidney disease, unspecified: Secondary | ICD-10-CM | POA: Insufficient documentation

## 2018-08-10 DIAGNOSIS — C61 Malignant neoplasm of prostate: Secondary | ICD-10-CM | POA: Insufficient documentation

## 2018-08-10 DIAGNOSIS — C7951 Secondary malignant neoplasm of bone: Secondary | ICD-10-CM | POA: Diagnosis not present

## 2018-08-10 DIAGNOSIS — Z5111 Encounter for antineoplastic chemotherapy: Secondary | ICD-10-CM | POA: Diagnosis not present

## 2018-08-10 LAB — CBC WITH DIFFERENTIAL/PLATELET
Abs Immature Granulocytes: 0.01 10*3/uL (ref 0.00–0.07)
Basophils Absolute: 0 10*3/uL (ref 0.0–0.1)
Basophils Relative: 0 %
Eosinophils Absolute: 0 10*3/uL (ref 0.0–0.5)
Eosinophils Relative: 1 %
HCT: 30.3 % — ABNORMAL LOW (ref 39.0–52.0)
Hemoglobin: 9.6 g/dL — ABNORMAL LOW (ref 13.0–17.0)
Immature Granulocytes: 0 %
Lymphocytes Relative: 33 %
Lymphs Abs: 1.4 10*3/uL (ref 0.7–4.0)
MCH: 30.7 pg (ref 26.0–34.0)
MCHC: 31.7 g/dL (ref 30.0–36.0)
MCV: 96.8 fL (ref 80.0–100.0)
Monocytes Absolute: 0.6 10*3/uL (ref 0.1–1.0)
Monocytes Relative: 15 %
Neutro Abs: 2.1 10*3/uL (ref 1.7–7.7)
Neutrophils Relative %: 51 %
Platelets: 350 10*3/uL (ref 150–400)
RBC: 3.13 MIL/uL — ABNORMAL LOW (ref 4.22–5.81)
RDW: 17.1 % — ABNORMAL HIGH (ref 11.5–15.5)
WBC: 4.1 10*3/uL (ref 4.0–10.5)
nRBC: 0 % (ref 0.0–0.2)

## 2018-08-10 LAB — COMPREHENSIVE METABOLIC PANEL
ALT: 22 U/L (ref 0–44)
AST: 25 U/L (ref 15–41)
Albumin: 3.7 g/dL (ref 3.5–5.0)
Alkaline Phosphatase: 49 U/L (ref 38–126)
Anion gap: 7 (ref 5–15)
BUN: 19 mg/dL (ref 8–23)
CO2: 21 mmol/L — ABNORMAL LOW (ref 22–32)
Calcium: 7.9 mg/dL — ABNORMAL LOW (ref 8.9–10.3)
Chloride: 106 mmol/L (ref 98–111)
Creatinine, Ser: 1.59 mg/dL — ABNORMAL HIGH (ref 0.61–1.24)
GFR calc Af Amer: 50 mL/min — ABNORMAL LOW (ref >60)
GFR calc non Af Amer: 43 mL/min — ABNORMAL LOW (ref >60)
Glucose, Bld: 96 mg/dL (ref 70–99)
Potassium: 4.2 mmol/L (ref 3.5–5.1)
Sodium: 134 mmol/L — ABNORMAL LOW (ref 135–145)
Total Bilirubin: 0.6 mg/dL (ref 0.3–1.2)
Total Protein: 7.6 g/dL (ref 6.5–8.1)

## 2018-08-10 MED ORDER — DEXTROSE 5 % IV SOLN
20.0000 mg/m2 | Freq: Once | INTRAVENOUS | Status: AC
  Administered 2018-08-10: 41 mg via INTRAVENOUS
  Filled 2018-08-10: qty 4.1

## 2018-08-10 MED ORDER — PEGFILGRASTIM 6 MG/0.6ML ~~LOC~~ PSKT
6.0000 mg | PREFILLED_SYRINGE | Freq: Once | SUBCUTANEOUS | Status: AC
  Administered 2018-08-10: 6 mg via SUBCUTANEOUS
  Filled 2018-08-10: qty 0.6

## 2018-08-10 MED ORDER — DIPHENHYDRAMINE HCL 50 MG/ML IJ SOLN
25.0000 mg | Freq: Once | INTRAMUSCULAR | Status: AC
  Administered 2018-08-10: 11:00:00 25 mg via INTRAVENOUS
  Filled 2018-08-10: qty 1

## 2018-08-10 MED ORDER — FAMOTIDINE IN NACL 20-0.9 MG/50ML-% IV SOLN
20.0000 mg | Freq: Once | INTRAVENOUS | Status: AC
  Administered 2018-08-10: 20 mg via INTRAVENOUS
  Filled 2018-08-10: qty 50

## 2018-08-10 MED ORDER — SODIUM CHLORIDE 0.9 % IV SOLN
Freq: Once | INTRAVENOUS | Status: AC
  Administered 2018-08-10: 11:00:00 via INTRAVENOUS

## 2018-08-10 MED ORDER — HEPARIN SOD (PORK) LOCK FLUSH 100 UNIT/ML IV SOLN
500.0000 [IU] | Freq: Once | INTRAVENOUS | Status: AC | PRN
  Administered 2018-08-10: 500 [IU]

## 2018-08-10 MED ORDER — SODIUM CHLORIDE 0.9% FLUSH
10.0000 mL | INTRAVENOUS | Status: DC | PRN
  Administered 2018-08-10: 10 mL
  Filled 2018-08-10: qty 10

## 2018-08-10 MED ORDER — SODIUM CHLORIDE 0.9 % IV SOLN
10.0000 mg | Freq: Once | INTRAVENOUS | Status: AC
  Administered 2018-08-10: 10 mg via INTRAVENOUS
  Filled 2018-08-10: qty 10

## 2018-08-10 NOTE — Patient Instructions (Signed)
[facility] Discharge Instructions for Patients Receiving Chemotherapy  Today you received the following chemotherapy agents   To help prevent nausea and vomiting after your treatment, we encourage you to take your nausea medication   If you develop nausea and vomiting that is not controlled by your nausea medication, call the clinic.   BELOW ARE SYMPTOMS THAT SHOULD BE REPORTED IMMEDIATELY:  *FEVER GREATER THAN 100.5 F  *CHILLS WITH OR WITHOUT FEVER  NAUSEA AND VOMITING THAT IS NOT CONTROLLED WITH YOUR NAUSEA MEDICATION  *UNUSUAL SHORTNESS OF BREATH  *UNUSUAL BRUISING OR BLEEDING  TENDERNESS IN MOUTH AND THROAT WITH OR WITHOUT PRESENCE OF ULCERS  *URINARY PROBLEMS  *BOWEL PROBLEMS  UNUSUAL RASH Items with * indicate a potential emergency and should be followed up as soon as possible.  Feel free to call the clinic should you have any questions or concerns. The clinic phone number is [phone].  Please show the CHEMO ALERT CARD at check-in to the Emergency Department and triage nurse.   

## 2018-08-10 NOTE — Assessment & Plan Note (Signed)
1.  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 -Last bone scan on 07/25/2017 showed mixed response compared to prior scan on 09/06/2016.  Last CT scan on 12/20/2016 did not show any visceral metastasis but showed pulmonary fibrotic changes. -He is continuing to tolerate cabazitaxel exceptionally well.  Cycle 33 was on 07/20/2018. -Last PSA on 06/08/2018 was 0.85. - He does not report any neuropathy symptoms at this time.  He is continuing to work without any problems. -He may proceed with next cycle of cabazitaxel today.  We will reevaluate him in 3 weeks. - We will consider scans if PSA trends up.  2.  Bone metastasis: -He is continuing monthly denosumab without any problems. -We will continue calcium and vitamin D supplements.  3.  CKD: - His creatinine ranges between 1.6 and 2.

## 2018-08-10 NOTE — Progress Notes (Signed)
[address] [place], [place] [address]   CLINIC:  Medical Oncology/Hematology  PCP:  Holley Bouche, NP (Inactive) No address on file None   REASON FOR VISIT:  Follow-up for metastatic prostate cancer  CURRENT THERAPY:cabazitaxel every 3 weeks    BRIEF ONCOLOGIC HISTORY:    Malignant neoplasm of prostate ([place])   01/26/2014 Tumor Marker    PSA > 5000    01/28/2014 Initial Biopsy    Metastatic adenocarcinoma of prostate    02/05/2014 Surgery    Bilateral orchiectomy by Dr. Junious Silk    02/26/2014 Tumor Marker    PSA = 399.5    03/14/2014 Tumor Marker    PSA= 89.51    03/14/2014 - 06/26/2014 Chemotherapy    Docetaxel 75 mg/kg every 21 days x 6 cycles    04/24/2014 Tumor Marker    PSA- 19.89    07/23/2014 Procedure    Nephrostomy tube removed by IR, Dr. Geroge Baseman    07/24/2014 Tumor Marker    PSA= 6.15    10/16/2014 Tumor Marker    PSA: 3.54     01/08/2015 Tumor Marker    PSA: 2.13     01/17/2015 Imaging    CT CAP-  Massive pelvic and retroperitoneal lymphadenopathy noted on the prior study has nearly completely resolved, now with only a small amount of residual amorphous soft tissue predominantly around the the infrarenal abdominal aorta.    01/17/2015 Imaging    Bone scan- Widespread osseous metastatic disease with multiple foci of increased activity throughout the skeleton, corresponding with the findings on the prior CT from October, 2015.    04/11/2015 Tumor Marker    PSA: 1.87     07/21/2015 Imaging    Bone scan- Bony metastatic disease again noted at multiple sites, stable from prior study. No progression of bony metastatic disease is demonstrable on this study.    07/25/2015 Imaging    CT CAP- Stable matted soft tissue density in the retroperitoneum and extraperitoneal pelvis consistent with treated disease. No recurrent lymphadenopathy.  No pulmonary metastatic disease. Diffuse stable sclerotic metastatic bone disease.    01/14/2016 Imaging    Bone scan-  Multiple sites of abnormal increased tracer localization involving BILATERAL ribs, T11, T12, questionably RIGHT scapula and LEFT iliac bone suspicious for osseous metastases.    01/23/2016 Imaging    CT CAP- 1. Similar widespread osseous metastasis. 2. Similar soft tissue thickening within the retroperitoneum of the abdomen. Improved left pelvic side wall soft tissue thickening. These are consistent with sites of treated disease. No well-defined adenopathy. 3. No new sites of disease.    05/03/2016 -  Chemotherapy    Jevtana every 21 days     05/03/2016 -  Chemotherapy    The patient had pegfilgrastim (NEULASTA) injection 6 mg, 6 mg, Subcutaneous,  Once, 1 of 1 cycle Administration: 6 mg (05/05/2016) pegfilgrastim (NEULASTA ONPRO KIT) injection 6 mg, 6 mg, Subcutaneous, Once, 33 of 34 cycles Administration: 6 mg (05/24/2016), 6 mg (06/14/2016), 6 mg (07/07/2016), 6 mg (07/28/2016), 6 mg (08/18/2016), 6 mg (09/08/2016), 6 mg (09/29/2016), 6 mg (10/20/2016), 6 mg (11/10/2016), 6 mg (02/14/2017), 6 mg (03/07/2017), 6 mg (03/28/2017), 6 mg (04/20/2017), 6 mg (05/11/2017), 6 mg (06/27/2017), 6 mg (08/30/2017), 6 mg (09/20/2017), 6 mg (10/11/2017), 6 mg (11/02/2017), 6 mg (08/09/2017), 6 mg (11/23/2017), 6 mg (12/14/2017), 6 mg (01/09/2018), 6 mg (01/31/2018), 6 mg (04/04/2018), 6 mg (04/27/2018), 6 mg (05/18/2018), 6 mg (06/08/2018), 6 mg (06/29/2018), 6 mg (07/20/2018),  6 mg (08/10/2018) cabazitaxel (JEVTANA) 41 mg in dextrose 5 % 250 mL chemo infusion, 20 mg/m2 = 41 mg (100 % of original dose 20 mg/m2), Intravenous,  Once, 34 of 35 cycles Dose modification: 20 mg/m2 (original dose 20 mg/m2, Cycle 1, Reason: Dose not tolerated, Comment: recommended by Data to use 4m/m) Administration: 41 mg (05/03/2016), 41 mg (05/24/2016), 41 mg (06/14/2016), 41 mg (07/07/2016), 41 mg (07/28/2016), 41 mg (08/18/2016), 41 mg (09/08/2016), 41 mg (09/29/2016), 41 mg (10/20/2016), 41 mg (11/10/2016), 41 mg (02/14/2017), 41 mg  (03/07/2017), 41 mg (03/28/2017), 41 mg (04/20/2017), 41 mg (05/11/2017), 41 mg (06/27/2017), 41 mg (08/30/2017), 41 mg (09/20/2017), 41 mg (10/11/2017), 41 mg (11/02/2017), 41 mg (08/09/2017), 41 mg (11/23/2017), 41 mg (12/14/2017), 41 mg (01/09/2018), 41 mg (01/31/2018), 41 mg (02/21/2018), 41 mg (04/04/2018), 41 mg (04/27/2018), 41 mg (05/18/2018), 41 mg (06/08/2018), 41 mg (06/29/2018), 41 mg (07/20/2018), 41 mg (08/10/2018)  for chemotherapy treatment.     09/06/2016 Imaging    Restaging CT C/A/P: IMPRESSION: 1. Similar appearance of widespread sclerotic bone metastases. 2. No change in soft tissue thickening within the retroperitoneum and left pelvic sidewall. No well defined adenopathy identified. 3. No new sites of disease 4. Aortic Atherosclerosis (ICD10-I70.0). Coronary artery calcifications noted. 5. 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 6. Stable 9 mm pancreatic cystic lesion. Favor pseudocyst. Indolent neoplasm may look similar. Attention on follow-up imaging.    09/06/2016 Imaging    Bone Scan: IMPRESSION: In this patient with known diffuse sclerotic metastatic disease, bone scan does not reveal progression of radiotracer uptake. Several of the areas of previously demonstrated radiotracer uptake appear less prominent.  Change in appearance of radiotracer uptake involving the mandible now more notable on the right and previously more notable on the left may reflect result of dental disease rather than metastatic disease.     04/08/2017 Tumor Marker    PSA 0.89      CANCER STAGING: Cancer Staging No matching staging information was found for the patient.   INTERVAL HISTORY:  [name]89y.o. male returns for routine follow-up and consideration for next cycle of chemotherapy. He is here today alone. He states that he has been doing well since his last visit. He states that he is only tired after work. Denies any nausea,  vomiting, or diarrhea. Denies any new pains. Had not noticed any recent bleeding such as epistaxis, hematuria or hematochezia. Denies recent chest pain on exertion, shortness of breath on minimal exertion, pre-syncopal episodes, or palpitations. Denies any numbness or tingling in hands or feet. Denies any recent fevers, infections, or recent hospitalizations. Patient reports appetite at 100% and energy level at 75%.   REVIEW OF SYSTEMS:  Review of Systems  All other systems reviewed and are negative.    PAST MEDICAL/SURGICAL HISTORY:  Past Medical History:  Diagnosis Date   [place] 01[place]   Alcohol abuse    Anemia of chronic disease 01/26/2014   Bilateral hydronephrosis 01/26/2014   [place] 01/[place]   Homelessness 01/31/2014   [place] cancer [place] to multiple sites [place] 01/30/2014   [place]is ([facility]    UTI (lower urinary tract infection)    Past Surgical History:  Procedure Laterality Date   ORCHIECTOMY Bilateral 02/05/2014   Procedure: BILATERAL ORCHIECTOMY;  Surgeon: MFestus Aloe MD;  Location: [facility];  Service: Urology;  Laterality: Bilateral;   PERCUTANEOUS NEPHROSTOMY Bilateral    IR Dr. EJunious Silkchanged on 04/22/2014  PORT-A-CATH REMOVAL Right 08/08/2017   Procedure: REMOVAL PORT-A-CATH RIGHT CHEST (PROCEDURE #2);  Surgeon: Aviva Signs, MD;  Location: [facility];  Service: General;  Laterality: Right;   PORTACATH PLACEMENT Right 03/12/14   PORTACATH PLACEMENT Left 08/08/2017   Procedure: INSERTION POWER PORT WITH ATTACHED CATHETER LEFT SUBCLAVIAN (PROCEDURE #1);  Surgeon: Aviva Signs, MD;  Location: [facility];  Service: General;  Laterality: Left;     SOCIAL HISTORY:  Social History   Socioeconomic History   Marital status: Single    Spouse name: Not on file   Number of children: 1   Years of education: 9th   Highest education level: Not on file  Occupational History   Occupation: disabilty     Employer: [employer] resource strain: Not on file   Food insecurity:    Worry: Not on file    Inability: Not on file   Transportation needs:    Medical: Not on file    Non-medical: Not on file  Tobacco Use   Smoking status: Current Every Day Smoker    Packs/day: 0.25    Years: 55.00    Pack years: 13.75    Types: Cigarettes   Smokeless tobacco: Never Used  Substance and Sexual Activity   Alcohol use: Yes    Alcohol/week: 1.0 standard drinks    Types: 1 Cans of beer per week   Drug use: Yes    Types: Cocaine    Comment: last used Cocaine 01/25/14   Sexual activity: Yes  Lifestyle   Physical activity:    Days per week: Not on file    Minutes per session: Not on file   Stress: Not on file  Relationships   Social connections:    Talks on phone: Not on file    Gets together: Not on file    Attends religious service: Not on file    Active member of club or organization: Not on file    Attends meetings of clubs or organizations: Not on file    Relationship status: Not on file   Intimate partner violence:    Fear of current or ex partner: Not on file    Emotionally abused: Not on file    Physically abused: Not on file    Forced sexual activity: Not on file  Other Topics Concern   Not on file  Social History Narrative   Lives at [employer]   1 son, lives in [place], [place]   Can read and write in native language             FAMILY HISTORY:  Family History  Problem Relation Age of Onset   Cancer Brother 58    CURRENT MEDICATIONS:  Outpatient Encounter Medications as of 08/10/2018  Medication Sig Note   calcium gluconate 500 MG tablet Take 1 tablet by mouth 2 (two) times daily.    prochlorperazine (COMPAZINE) 10 MG tablet The day after chemo take 1 tab four times a day x 2 days. Then may take 1 tab four times a day if needed for nausea/vomiting. 07/24/2014: Just takes as needed    Facility-Administered Encounter Medications as of 08/10/2018  Medication   cyanocobalamin ((VITAMIN B-12)) injection 1,000 mcg   denosumab (XGEVA) injection 120 mg    ALLERGIES:  No Known Allergies   PHYSICAL EXAM:  ECOG Performance status: 1  Vitals:   08/10/18 0844  BP: 104/67  Pulse: 80  Resp: 18  Temp: 98.1 F (36.7 C)  SpO2: 100%   Filed Weights   08/10/18 0844  Weight: 167 lb 12.8 oz (76.1 kg)    Physical Exam Vitals signs reviewed.  Constitutional:      Appearance: Normal appearance.  Cardiovascular:     Rate and Rhythm: Normal rate.     Heart sounds: Normal heart sounds.  Pulmonary:     Effort: Pulmonary effort is normal.     Breath sounds: Normal breath sounds.  Abdominal:     General: There is no distension.     Palpations: Abdomen is soft. There is no mass.  Musculoskeletal:        General: No swelling.  Skin:    General: Skin is warm.  Neurological:     General: No focal deficit present.     Mental Status: He is alert and oriented to person, place, and time.  Psychiatric:        Mood and Affect: Mood normal.        Behavior: Behavior normal.      LABORATORY DATA:  I have reviewed the labs as listed.  CBC    Component Value Date/Time   WBC 4.1 08/10/2018 0810   RBC 3.13 (L) 08/10/2018 0810   HGB 9.6 (L) 08/10/2018 0810   HCT 30.3 (L) 08/10/2018 0810   PLT 350 08/10/2018 0810   MCV 96.8 08/10/2018 0810   MCH 30.7 08/10/2018 0810   MCHC 31.7 08/10/2018 0810   RDW 17.1 (H) 08/10/2018 0810   LYMPHSABS 1.4 08/10/2018 0810   MONOABS 0.6 08/10/2018 0810   EOSABS 0.0 08/10/2018 0810   BASOSABS 0.0 08/10/2018 0810   CMP Latest Ref Rng & Units 08/10/2018 07/20/2018 06/29/2018  Glucose 70 - 99 mg/dL 96 90 90  BUN 8 - 23 mg/dL '19 20 22  ' Creatinine 0.61 - 1.24 mg/dL 1.59(H) 1.74(H) 2.15(H)  Sodium 135 - 145 mmol/L 134(L) 138 135  Potassium 3.5 - 5.1 mmol/L 4.2 5.1 4.7  Chloride 98 - 111 mmol/L 106 109 106  CO2 22 - 32 mmol/L 21(L) 20(L)  22  Calcium 8.9 - 10.3 mg/dL 7.9(L) 8.7(L) 8.5(L)  Total Protein 6.5 - 8.1 g/dL 7.6 8.3(H) 8.1  Total Bilirubin 0.3 - 1.2 mg/dL 0.6 0.4 0.5  Alkaline Phos 38 - 126 U/L 49 67 63  AST 15 - 41 U/L '25 21 31  ' ALT 0 - 44 U/L '22 16 16       ' DIAGNOSTIC IMAGING:  I have independently reviewed the scans and discussed with the patient.   I have reviewed Venita Lick LPN's note and agree with the documentation.  I personally performed a face-to-face visit, made revisions and my assessment and plan is as follows.    ASSESSMENT & PLAN:   Malignant neoplasm of prostate ([place]) 1.  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 -Last bone scan on 07/25/2017 showed mixed response compared to prior scan on 09/06/2016.  Last CT scan on 12/20/2016 did not show any visceral metastasis but showed pulmonary fibrotic changes. -He is continuing to tolerate cabazitaxel exceptionally well.  Cycle 33 was on 07/20/2018. -Last PSA on 06/08/2018 was 0.85. - He does not report any neuropathy symptoms at this time.  He is continuing to work without any problems. -He may proceed with next cycle of cabazitaxel today.  We will reevaluate him in 3 weeks. - We will consider scans if PSA trends  up.  2.  Bone metastasis: -He is continuing monthly denosumab without any problems. -We will continue calcium and vitamin D supplements.  3.  CKD: - His creatinine ranges between 1.6 and 2.   Total time spent is 25 minutes with more than 50% of the time spent face-to-face discussing and reinforcing treatment plan, side effects and coordination of care.     Orders placed this encounter:  No orders of the defined types were placed in this encounter.     Derek Jack, MD [facility] [phone]

## 2018-08-10 NOTE — Progress Notes (Signed)
Treatment given today per MD orders. Tolerated infusion without adverse affects. Vital signs stable. No complaints at this time. Discharged from clinic ambulatory. Neulasta on Pro on with green light noted.  F/U with [facility] as scheduled.

## 2018-08-10 NOTE — Patient Instructions (Addendum)
[facility] at [facility] Discharge Instructions  You were seen today by Dr. Katragadda. He went over your recent lab results. He will see you back in 3 weeks for labs and follow up.   Thank you for choosing [facility] at [facility] to provide your oncology and hematology care.  To afford each patient quality time with our provider, please arrive at least 15 minutes before your scheduled appointment time.   If you have a lab appointment with the Cancer Center please come in thru the  Main Entrance and check in at the main information desk  You need to re-schedule your appointment should you arrive 10 or more minutes late.  We strive to give you quality time with our providers, and arriving late affects you and other patients whose appointments are after yours.  Also, if you no show three or more times for appointments you may be dismissed from the clinic at the providers discretion.     Again, thank you for choosing [facility].  Our hope is that these requests will decrease the amount of time that you wait before being seen by our physicians.       _____________________________________________________________  Should you have questions after your visit to [facility], please contact our office at [phone] between the hours of 8:00 a.m. and 4:30 p.m.  Voicemails left after 4:00 p.m. will not be returned until the following business day.  For prescription refill requests, have your pharmacy contact our office and allow 72 hours.    Cancer Center Support Programs:   > Cancer Support Group  2nd Tuesday of the month 1pm-2pm, Journey Room    

## 2018-09-01 ENCOUNTER — Other Ambulatory Visit: Payer: Self-pay

## 2018-09-01 ENCOUNTER — Inpatient Hospital Stay (HOSPITAL_COMMUNITY): Payer: Medicare Other

## 2018-09-01 ENCOUNTER — Inpatient Hospital Stay (HOSPITAL_BASED_OUTPATIENT_CLINIC_OR_DEPARTMENT_OTHER): Payer: Medicare Other | Admitting: Hematology

## 2018-09-01 ENCOUNTER — Inpatient Hospital Stay (HOSPITAL_COMMUNITY): Payer: Medicare Other | Attending: Hematology

## 2018-09-01 ENCOUNTER — Encounter (HOSPITAL_COMMUNITY): Payer: Self-pay | Admitting: Hematology

## 2018-09-01 VITALS — BP 107/65 | HR 65 | Temp 98.1°F | Resp 18 | Wt 161.5 lb

## 2018-09-01 VITALS — BP 104/63 | HR 70 | Temp 97.8°F | Resp 18

## 2018-09-01 DIAGNOSIS — M899 Disorder of bone, unspecified: Secondary | ICD-10-CM

## 2018-09-01 DIAGNOSIS — F17210 Nicotine dependence, cigarettes, uncomplicated: Secondary | ICD-10-CM | POA: Diagnosis not present

## 2018-09-01 DIAGNOSIS — N189 Chronic kidney disease, unspecified: Secondary | ICD-10-CM | POA: Diagnosis not present

## 2018-09-01 DIAGNOSIS — Z5111 Encounter for antineoplastic chemotherapy: Secondary | ICD-10-CM | POA: Diagnosis not present

## 2018-09-01 DIAGNOSIS — Z79899 Other long term (current) drug therapy: Secondary | ICD-10-CM | POA: Insufficient documentation

## 2018-09-01 DIAGNOSIS — Z9221 Personal history of antineoplastic chemotherapy: Secondary | ICD-10-CM

## 2018-09-01 DIAGNOSIS — C7951 Secondary malignant neoplasm of bone: Secondary | ICD-10-CM | POA: Insufficient documentation

## 2018-09-01 DIAGNOSIS — C61 Malignant neoplasm of prostate: Secondary | ICD-10-CM | POA: Insufficient documentation

## 2018-09-01 DIAGNOSIS — E538 Deficiency of other specified B group vitamins: Secondary | ICD-10-CM

## 2018-09-01 DIAGNOSIS — D649 Anemia, unspecified: Secondary | ICD-10-CM | POA: Insufficient documentation

## 2018-09-01 LAB — COMPREHENSIVE METABOLIC PANEL
ALT: 15 U/L (ref 0–44)
AST: 22 U/L (ref 15–41)
Albumin: 3.7 g/dL (ref 3.5–5.0)
Alkaline Phosphatase: 59 U/L (ref 38–126)
Anion gap: 7 (ref 5–15)
BUN: 22 mg/dL (ref 8–23)
CO2: 24 mmol/L (ref 22–32)
Calcium: 8.2 mg/dL — ABNORMAL LOW (ref 8.9–10.3)
Chloride: 106 mmol/L (ref 98–111)
Creatinine, Ser: 1.95 mg/dL — ABNORMAL HIGH (ref 0.61–1.24)
GFR calc Af Amer: 39 mL/min — ABNORMAL LOW (ref >60)
GFR calc non Af Amer: 34 mL/min — ABNORMAL LOW (ref >60)
Glucose, Bld: 101 mg/dL — ABNORMAL HIGH (ref 70–99)
Potassium: 4.3 mmol/L (ref 3.5–5.1)
Sodium: 137 mmol/L (ref 135–145)
Total Bilirubin: 0.3 mg/dL (ref 0.3–1.2)
Total Protein: 7.3 g/dL (ref 6.5–8.1)

## 2018-09-01 LAB — CBC WITH DIFFERENTIAL/PLATELET
Abs Immature Granulocytes: 0.01 10*3/uL (ref 0.00–0.07)
Basophils Absolute: 0 10*3/uL (ref 0.0–0.1)
Basophils Relative: 0 %
Eosinophils Absolute: 0 10*3/uL (ref 0.0–0.5)
Eosinophils Relative: 1 %
HCT: 32.2 % — ABNORMAL LOW (ref 39.0–52.0)
Hemoglobin: 10.3 g/dL — ABNORMAL LOW (ref 13.0–17.0)
Immature Granulocytes: 0 %
Lymphocytes Relative: 30 %
Lymphs Abs: 1.8 10*3/uL (ref 0.7–4.0)
MCH: 31 pg (ref 26.0–34.0)
MCHC: 32 g/dL (ref 30.0–36.0)
MCV: 97 fL (ref 80.0–100.0)
Monocytes Absolute: 0.6 10*3/uL (ref 0.1–1.0)
Monocytes Relative: 10 %
Neutro Abs: 3.4 10*3/uL (ref 1.7–7.7)
Neutrophils Relative %: 59 %
Platelets: 289 10*3/uL (ref 150–400)
RBC: 3.32 MIL/uL — ABNORMAL LOW (ref 4.22–5.81)
RDW: 17.1 % — ABNORMAL HIGH (ref 11.5–15.5)
WBC: 5.9 10*3/uL (ref 4.0–10.5)
nRBC: 0 % (ref 0.0–0.2)

## 2018-09-01 MED ORDER — DENOSUMAB 120 MG/1.7ML ~~LOC~~ SOLN
120.0000 mg | Freq: Once | SUBCUTANEOUS | Status: AC
  Administered 2018-09-01: 120 mg via SUBCUTANEOUS
  Filled 2018-09-01: qty 1.7

## 2018-09-01 MED ORDER — CYANOCOBALAMIN 1000 MCG/ML IJ SOLN
1000.0000 ug | Freq: Once | INTRAMUSCULAR | Status: AC
  Administered 2018-09-01: 1000 ug via INTRAMUSCULAR
  Filled 2018-09-01: qty 1

## 2018-09-01 MED ORDER — PEGFILGRASTIM 6 MG/0.6ML ~~LOC~~ PSKT
6.0000 mg | PREFILLED_SYRINGE | Freq: Once | SUBCUTANEOUS | Status: AC
  Administered 2018-09-01: 6 mg via SUBCUTANEOUS
  Filled 2018-09-01: qty 0.6

## 2018-09-01 MED ORDER — FAMOTIDINE IN NACL 20-0.9 MG/50ML-% IV SOLN
20.0000 mg | Freq: Once | INTRAVENOUS | Status: AC
  Administered 2018-09-01: 20 mg via INTRAVENOUS
  Filled 2018-09-01: qty 50

## 2018-09-01 MED ORDER — SODIUM CHLORIDE 0.9 % IV SOLN
Freq: Once | INTRAVENOUS | Status: AC
  Administered 2018-09-01: 10:00:00 via INTRAVENOUS

## 2018-09-01 MED ORDER — HEPARIN SOD (PORK) LOCK FLUSH 100 UNIT/ML IV SOLN
500.0000 [IU] | Freq: Once | INTRAVENOUS | Status: AC | PRN
  Administered 2018-09-01: 13:00:00 500 [IU]

## 2018-09-01 MED ORDER — SODIUM CHLORIDE 0.9 % IV SOLN
10.0000 mg | Freq: Once | INTRAVENOUS | Status: AC
  Administered 2018-09-01: 11:00:00 10 mg via INTRAVENOUS
  Filled 2018-09-01: qty 10

## 2018-09-01 MED ORDER — SODIUM CHLORIDE 0.9 % IV SOLN
20.0000 mg/m2 | Freq: Once | INTRAVENOUS | Status: AC
  Administered 2018-09-01: 12:00:00 41 mg via INTRAVENOUS
  Filled 2018-09-01: qty 4.1

## 2018-09-01 MED ORDER — SODIUM CHLORIDE 0.9% FLUSH
10.0000 mL | INTRAVENOUS | Status: DC | PRN
  Administered 2018-09-01: 10 mL
  Filled 2018-09-01: qty 10

## 2018-09-01 MED ORDER — DIPHENHYDRAMINE HCL 50 MG/ML IJ SOLN
25.0000 mg | Freq: Once | INTRAMUSCULAR | Status: AC
  Administered 2018-09-01: 11:00:00 25 mg via INTRAVENOUS
  Filled 2018-09-01: qty 1

## 2018-09-01 NOTE — Progress Notes (Signed)
Jeff [name], [place] [address]   CLINIC:  Medical Oncology/Hematology  PCP:  Holley Bouche, NP (Inactive) No address on file None   REASON FOR VISIT:  Follow-up for metastatic prostate cancer  CURRENT THERAPY:cabazitaxel every 3 weeks   BRIEF ONCOLOGIC HISTORY:    Malignant neoplasm of prostate ([place])   01/26/2014 Tumor Marker    PSA > 5000    01/28/2014 Initial Biopsy    Metastatic adenocarcinoma of prostate    02/05/2014 Surgery    Bilateral orchiectomy by Dr. Junious Silk    02/26/2014 Tumor Marker    PSA = 399.5    03/14/2014 Tumor Marker    PSA= 89.51    03/14/2014 - 06/26/2014 Chemotherapy    Docetaxel 75 mg/kg every 21 days x 6 cycles    04/24/2014 Tumor Marker    PSA- 19.89    07/23/2014 Procedure    Nephrostomy tube removed by IR, Dr. Geroge Baseman    07/24/2014 Tumor Marker    PSA= 6.15    10/16/2014 Tumor Marker    PSA: 3.54     01/08/2015 Tumor Marker    PSA: 2.13     01/17/2015 Imaging    CT CAP-  Massive pelvic and retroperitoneal lymphadenopathy noted on the prior study has nearly completely resolved, now with only a small amount of residual amorphous soft tissue predominantly around the the infrarenal abdominal aorta.    01/17/2015 Imaging    Bone scan- Widespread osseous metastatic disease with multiple foci of increased activity throughout the skeleton, corresponding with the findings on the prior CT from October, 2015.    04/11/2015 Tumor Marker    PSA: 1.87     07/21/2015 Imaging    Bone scan- Bony metastatic disease again noted at multiple sites, stable from prior study. No progression of bony metastatic disease is demonstrable on this study.    07/25/2015 Imaging    CT CAP- Stable matted soft tissue density in the retroperitoneum and extraperitoneal pelvis consistent with treated disease. No recurrent lymphadenopathy.  No pulmonary metastatic disease. Diffuse stable sclerotic metastatic bone disease.    01/14/2016 Imaging    Bone scan-  Multiple sites of abnormal increased tracer localization involving BILATERAL ribs, T11, T12, questionably RIGHT scapula and LEFT iliac bone suspicious for osseous metastases.    01/23/2016 Imaging    CT CAP- 1. Similar widespread osseous metastasis. 2. Similar soft tissue thickening within the retroperitoneum of the abdomen. Improved left pelvic side wall soft tissue thickening. These are consistent with sites of treated disease. No well-defined adenopathy. 3. No new sites of disease.    05/03/2016 -  Chemotherapy    Jevtana every 21 days     05/03/2016 -  Chemotherapy    The patient had pegfilgrastim (NEULASTA) injection 6 mg, 6 mg, Subcutaneous,  Once, 1 of 1 cycle Administration: 6 mg (05/05/2016) pegfilgrastim (NEULASTA ONPRO KIT) injection 6 mg, 6 mg, Subcutaneous, Once, 34 of 38 cycles Administration: 6 mg (05/24/2016), 6 mg (06/14/2016), 6 mg (07/07/2016), 6 mg (07/28/2016), 6 mg (08/18/2016), 6 mg (09/08/2016), 6 mg (09/29/2016), 6 mg (10/20/2016), 6 mg (11/10/2016), 6 mg (02/14/2017), 6 mg (03/07/2017), 6 mg (03/28/2017), 6 mg (04/20/2017), 6 mg (05/11/2017), 6 mg (06/27/2017), 6 mg (08/30/2017), 6 mg (09/20/2017), 6 mg (10/11/2017), 6 mg (11/02/2017), 6 mg (08/09/2017), 6 mg (11/23/2017), 6 mg (12/14/2017), 6 mg (01/09/2018), 6 mg (01/31/2018), 6 mg (04/04/2018), 6 mg (04/27/2018), 6 mg (05/18/2018), 6 mg (06/08/2018), 6 mg (06/29/2018), 6 mg (07/20/2018), 6  mg (08/10/2018) cabazitaxel (JEVTANA) 41 mg in dextrose 5 % 250 mL chemo infusion, 20 mg/m2 = 41 mg (100 % of original dose 20 mg/m2), Intravenous,  Once, 35 of 39 cycles Dose modification: 20 mg/m2 (original dose 20 mg/m2, Cycle 1, Reason: Dose not tolerated, Comment: recommended by Data to use 76m/m) Administration: 41 mg (05/03/2016), 41 mg (05/24/2016), 41 mg (06/14/2016), 41 mg (07/07/2016), 41 mg (07/28/2016), 41 mg (08/18/2016), 41 mg (09/08/2016), 41 mg (09/29/2016), 41 mg (10/20/2016), 41 mg (11/10/2016), 41 mg (02/14/2017), 41 mg  (03/07/2017), 41 mg (03/28/2017), 41 mg (04/20/2017), 41 mg (05/11/2017), 41 mg (06/27/2017), 41 mg (08/30/2017), 41 mg (09/20/2017), 41 mg (10/11/2017), 41 mg (11/02/2017), 41 mg (08/09/2017), 41 mg (11/23/2017), 41 mg (12/14/2017), 41 mg (01/09/2018), 41 mg (01/31/2018), 41 mg (02/21/2018), 41 mg (04/04/2018), 41 mg (04/27/2018), 41 mg (05/18/2018), 41 mg (06/08/2018), 41 mg (06/29/2018), 41 mg (07/20/2018), 41 mg (08/10/2018)  for chemotherapy treatment.     09/06/2016 Imaging    Restaging CT C/A/P: IMPRESSION: 1. Similar appearance of widespread sclerotic bone metastases. 2. No change in soft tissue thickening within the retroperitoneum and left pelvic sidewall. No well defined adenopathy identified. 3. No new sites of disease 4. Aortic Atherosclerosis (ICD10-I70.0). Coronary artery calcifications noted. 5. 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 6. Stable 9 mm pancreatic cystic lesion. Favor pseudocyst. Indolent neoplasm may look similar. Attention on follow-up imaging.    09/06/2016 Imaging    Bone Scan: IMPRESSION: In this patient with known diffuse sclerotic metastatic disease, bone scan does not reveal progression of radiotracer uptake. Several of the areas of previously demonstrated radiotracer uptake appear less prominent.  Change in appearance of radiotracer uptake involving the mandible now more notable on the right and previously more notable on the left may reflect result of dental disease rather than metastatic disease.     04/08/2017 Tumor Marker    PSA 0.89      CANCER STAGING: Cancer Staging No matching staging information was found for the patient.   INTERVAL HISTORY:  [name]89y.o. male returns for routine follow-up and consideration for next cycle of chemotherapy. He is here today alone. He states that he has been feeling well since his last visit.  Denies any nausea, vomiting, or diarrhea. Denies any new  pains. Had not noticed any recent bleeding such as epistaxis, hematuria or hematochezia. Denies recent chest pain on exertion, shortness of breath on minimal exertion, pre-syncopal episodes, or palpitations. Denies any numbness or tingling in hands or feet. Denies any recent fevers, infections, or recent hospitalizations. Patient reports appetite at 100% and energy level at 75%.      REVIEW OF SYSTEMS:  Review of Systems  All other systems reviewed and are negative.    PAST MEDICAL/SURGICAL HISTORY:  Past Medical History:  Diagnosis Date   Acute renal failure ([facility] 01/26/2014   Alcohol abuse    Anemia of chronic disease 01/26/2014   Bilateral hydronephrosis 01/26/2014   History of cocaine abuse ([facility] 01/26/2014   Homelessness 01/31/2014   Prostate cancer metastatic to multiple sites ([facility] 01/30/2014   [place]epsis ([facility]    UTI (lower urinary tract infection)    Past Surgical History:  Procedure Laterality Date   ORCHIECTOMY Bilateral 02/05/2014   Procedure: BILATERAL ORCHIECTOMY;  Surgeon: MFestus Aloe MD;  Location: [facility];  Service: Urology;  Laterality: Bilateral;   PERCUTANEOUS NEPHROSTOMY Bilateral    IR Dr. EJunious Silkchanged on 04/22/2014   PORT-A-CATH REMOVAL Right 08/08/2017  Procedure: REMOVAL PORT-A-CATH RIGHT CHEST (PROCEDURE #2);  Surgeon: Aviva Signs, MD;  Location: [facility];  Service: General;  Laterality: Right;   PORTACATH PLACEMENT Right 03/12/14   PORTACATH PLACEMENT Left 08/08/2017   Procedure: INSERTION POWER PORT WITH ATTACHED CATHETER LEFT SUBCLAVIAN (PROCEDURE #1);  Surgeon: Aviva Signs, MD;  Location: [facility];  Service: General;  Laterality: Left;     SOCIAL HISTORY:  Social History   Socioeconomic History   Marital status: Single    Spouse name: Not on file   Number of children: 1   Years of education: 9th   Highest education level: Not on file  Occupational History   Occupation: disabilty    Employer: [employer] resource strain: Not on file   Food insecurity:    Worry: Not on file    Inability: Not on file   Transportation needs:    Medical: Not on file    Non-medical: Not on file  Tobacco Use   Smoking status: Current Every Day Smoker    Packs/day: 0.25    Years: 55.00    Pack years: 13.75    Types: Cigarettes   Smokeless tobacco: Never Used  Substance and Sexual Activity   Alcohol use: Yes    Alcohol/week: 1.0 standard drinks    Types: 1 Cans of beer per week   Drug use: Yes    Types: Cocaine    Comment: last used Cocaine 01/25/14   Sexual activity: Yes  Lifestyle   Physical activity:    Days per week: Not on file    Minutes per session: Not on file   Stress: Not on file  Relationships   Social connections:    Talks on phone: Not on file    Gets together: Not on file    Attends religious service: Not on file    Active member of club or organization: Not on file    Attends meetings of clubs or organizations: Not on file    Relationship status: Not on file   Intimate partner violence:    Fear of current or ex partner: Not on file    Emotionally abused: Not on file    Physically abused: Not on file    Forced sexual activity: Not on file  Other Topics Concern   Not on file  Social History Narrative   Lives at [employer] 9th grade   1 son, lives in [place], [place]   Can read and write in native language             FAMILY HISTORY:  Family History  Problem Relation Age of Onset   Cancer Brother 2    CURRENT MEDICATIONS:  Outpatient Encounter Medications as of 09/01/2018  Medication Sig Note   calcium gluconate 500 MG tablet Take 1 tablet by mouth 2 (two) times daily.    prochlorperazine (COMPAZINE) 10 MG tablet The day after chemo take 1 tab four times a day x 2 days. Then may take 1 tab four times a day if needed for nausea/vomiting. 07/24/2014: Just takes as needed   Facility-Administered Encounter Medications as  of 09/01/2018  Medication   cyanocobalamin ((VITAMIN B-12)) injection 1,000 mcg   denosumab (XGEVA) injection 120 mg    ALLERGIES:  No Known Allergies   PHYSICAL EXAM:  ECOG Performance status: 1  Vitals:   09/01/18 0852  BP: 107/65  Pulse: 65  Resp: 18  Temp: 98.1 F (36.7 C)  SpO2: 100%   Filed Weights   09/01/18 0852  Weight: 161 lb 8 oz (73.3 kg)    Physical Exam Vitals signs reviewed.  Constitutional:      Appearance: Normal appearance.  Cardiovascular:     Rate and Rhythm: Normal rate and regular rhythm.     Heart sounds: Normal heart sounds.  Pulmonary:     Effort: Pulmonary effort is normal.     Breath sounds: Normal breath sounds.  Abdominal:     General: There is no distension.     Palpations: Abdomen is soft. There is no mass.  Musculoskeletal:        General: No swelling.  Skin:    General: Skin is warm.  Neurological:     General: No focal deficit present.     Mental Status: He is alert and oriented to person, place, and time.  Psychiatric:        Mood and Affect: Mood normal.        Behavior: Behavior normal.      LABORATORY DATA:  I have reviewed the labs as listed.  CBC    Component Value Date/Time   WBC 5.9 09/01/2018 0858   RBC 3.32 (L) 09/01/2018 0858   HGB 10.3 (L) 09/01/2018 0858   HCT 32.2 (L) 09/01/2018 0858   PLT 289 09/01/2018 0858   MCV 97.0 09/01/2018 0858   MCH 31.0 09/01/2018 0858   MCHC 32.0 09/01/2018 0858   RDW 17.1 (H) 09/01/2018 0858   LYMPHSABS 1.8 09/01/2018 0858   MONOABS 0.6 09/01/2018 0858   EOSABS 0.0 09/01/2018 0858   BASOSABS 0.0 09/01/2018 0858   CMP Latest Ref Rng & Units 09/01/2018 08/10/2018 07/20/2018  Glucose 70 - 99 mg/dL 101(H) 96 90  BUN 8 - 23 mg/dL _0 Creatinine 0.61 - 1.24 mg/dL 1.95(H) 1.59(H) 1.74(H)  Sodium 135 - 145 mmol/L 137 134(L) 138  Potassium 3.5 - 5.1 mmol/L 4.3 4.2 5.1  Chloride 98 - 111 mmol/L 106 106 109  CO2 22 - 32 mmol/L 24 21(L) 20(L)  Calcium 8.9 - 10.3 mg/dL  8.2(L) 7.9(L) 8.7(L)  Total Protein 6.5 - 8.1 g/dL 7.3 7.6 8.3(H)  Total Bilirubin 0.3 - 1.2 mg/dL 0.3 0.6 0.4  Alkaline Phos 38 - 126 U/L 59 49 67  AST 15 - 41 U/L _1 ALT 0 - 44 U/L _2 DIAGNOSTIC IMAGING:  I have independently reviewed the scans and discussed with the patient.   I have reviewed Venita Lick LPN's note and agree with the documentation.  I personally performed a face-to-face visit, made revisions and my assessment and plan is as follows.    ASSESSMENT & PLAN:   Malignant neoplasm of prostate ([place]) 1.  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 -Last bone scan on 07/25/2017 showed mixed response compared to prior scan on 09/06/2016.  Last CT scan on 12/20/2016 did not show any visceral metastasis but showed pulmonary fibrotic changes. -He is continuing to tolerate cabazitaxel very well.  Denies any GI side effects or neuropathy. -Last PSA on 06/08/2018 was 0.85. -We reviewed his blood work.  It is adequate to proceed with next cycle of cabazitaxel today. -We will consider scans if PSA trends up.  2.  Bone metastasis: -We will continue monthly denosumab injections. -he will continue calcium and vitamin D supplements.  3.  CKD: -His creatinine ranges between 1.6 and 2. -He has normocytic anemia with hemoglobin around 10.3.  We will consider checking his ferritin and iron panel.       Total time spent is 25 minutes with more than 50% of time spent face-to-face discussing treatment plan and coordination of care.    Orders placed this encounter:  Orders Placed This Encounter  Procedures   CBC with Differential/Platelet   Comprehensive metabolic panel   PSA      Derek Jack, MD [place] [phone]

## 2018-09-01 NOTE — Progress Notes (Signed)
Labs reviewed, creatinine noted by MD 1.95. proceed with treatment per MD.

## 2018-09-01 NOTE — Patient Instructions (Signed)
[facility] at [facility] Discharge Instructions  You were seen today by Dr. Katragadda. He went over your recent lab results. He will see you back in 3 weeks for labs and follow up.   Thank you for choosing [facility] at [facility] to provide your oncology and hematology care.  To afford each patient quality time with our provider, please arrive at least 15 minutes before your scheduled appointment time.   If you have a lab appointment with the Cancer Center please come in thru the  Main Entrance and check in at the main information desk  You need to re-schedule your appointment should you arrive 10 or more minutes late.  We strive to give you quality time with our providers, and arriving late affects you and other patients whose appointments are after yours.  Also, if you no show three or more times for appointments you may be dismissed from the clinic at the providers discretion.     Again, thank you for choosing [facility].  Our hope is that these requests will decrease the amount of time that you wait before being seen by our physicians.       _____________________________________________________________  Should you have questions after your visit to [facility], please contact our office at [phone] between the hours of 8:00 a.m. and 4:30 p.m.  Voicemails left after 4:00 p.m. will not be returned until the following business day.  For prescription refill requests, have your pharmacy contact our office and allow 72 hours.    Cancer Center Support Programs:   > Cancer Support Group  2nd Tuesday of the month 1pm-2pm, Journey Room    

## 2018-09-01 NOTE — Assessment & Plan Note (Signed)
1.  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 -Last bone scan on 07/25/2017 showed mixed response compared to prior scan on 09/06/2016.  Last CT scan on 12/20/2016 did not show any visceral metastasis but showed pulmonary fibrotic changes. -He is continuing to tolerate cabazitaxel very well.  Denies any GI side effects or neuropathy. -Last PSA on 06/08/2018 was 0.85. -We reviewed his blood work.  It is adequate to proceed with next cycle of cabazitaxel today. -We will consider scans if PSA trends up.  2.  Bone metastasis: -We will continue monthly denosumab injections. -he will continue calcium and vitamin D supplements.  3.  CKD: -His creatinine ranges between 1.6 and 2. -He has normocytic anemia with hemoglobin around 10.3.  We will consider checking his ferritin and iron panel.

## 2018-09-22 ENCOUNTER — Encounter (HOSPITAL_COMMUNITY): Payer: Self-pay | Admitting: Hematology

## 2018-09-22 ENCOUNTER — Inpatient Hospital Stay (HOSPITAL_COMMUNITY): Payer: Medicare Other

## 2018-09-22 ENCOUNTER — Other Ambulatory Visit: Payer: Self-pay

## 2018-09-22 ENCOUNTER — Inpatient Hospital Stay (HOSPITAL_BASED_OUTPATIENT_CLINIC_OR_DEPARTMENT_OTHER): Payer: Medicare Other | Admitting: Hematology

## 2018-09-22 VITALS — BP 122/87 | HR 66 | Temp 97.6°F | Resp 18

## 2018-09-22 DIAGNOSIS — N189 Chronic kidney disease, unspecified: Secondary | ICD-10-CM

## 2018-09-22 DIAGNOSIS — D649 Anemia, unspecified: Secondary | ICD-10-CM | POA: Diagnosis not present

## 2018-09-22 DIAGNOSIS — Z79899 Other long term (current) drug therapy: Secondary | ICD-10-CM

## 2018-09-22 DIAGNOSIS — C61 Malignant neoplasm of prostate: Secondary | ICD-10-CM

## 2018-09-22 DIAGNOSIS — M899 Disorder of bone, unspecified: Secondary | ICD-10-CM

## 2018-09-22 DIAGNOSIS — C7951 Secondary malignant neoplasm of bone: Secondary | ICD-10-CM

## 2018-09-22 DIAGNOSIS — F17210 Nicotine dependence, cigarettes, uncomplicated: Secondary | ICD-10-CM | POA: Diagnosis not present

## 2018-09-22 DIAGNOSIS — Z9221 Personal history of antineoplastic chemotherapy: Secondary | ICD-10-CM | POA: Diagnosis not present

## 2018-09-22 DIAGNOSIS — E538 Deficiency of other specified B group vitamins: Secondary | ICD-10-CM

## 2018-09-22 DIAGNOSIS — Z5111 Encounter for antineoplastic chemotherapy: Secondary | ICD-10-CM | POA: Diagnosis not present

## 2018-09-22 LAB — COMPREHENSIVE METABOLIC PANEL
ALT: 15 U/L (ref 0–44)
AST: 20 U/L (ref 15–41)
Albumin: 3.7 g/dL (ref 3.5–5.0)
Alkaline Phosphatase: 60 U/L (ref 38–126)
Anion gap: 10 (ref 5–15)
BUN: 15 mg/dL (ref 8–23)
CO2: 25 mmol/L (ref 22–32)
Calcium: 9 mg/dL (ref 8.9–10.3)
Chloride: 101 mmol/L (ref 98–111)
Creatinine, Ser: 1.55 mg/dL — ABNORMAL HIGH (ref 0.61–1.24)
GFR calc Af Amer: 51 mL/min — ABNORMAL LOW (ref >60)
GFR calc non Af Amer: 44 mL/min — ABNORMAL LOW (ref >60)
Glucose, Bld: 91 mg/dL (ref 70–99)
Potassium: 4.2 mmol/L (ref 3.5–5.1)
Sodium: 136 mmol/L (ref 135–145)
Total Bilirubin: 0.3 mg/dL (ref 0.3–1.2)
Total Protein: 7.5 g/dL (ref 6.5–8.1)

## 2018-09-22 LAB — CBC WITH DIFFERENTIAL/PLATELET
Abs Immature Granulocytes: 0.03 10*3/uL (ref 0.00–0.07)
Basophils Absolute: 0 10*3/uL (ref 0.0–0.1)
Basophils Relative: 0 %
Eosinophils Absolute: 0.1 10*3/uL (ref 0.0–0.5)
Eosinophils Relative: 1 %
HCT: 31.7 % — ABNORMAL LOW (ref 39.0–52.0)
Hemoglobin: 10.4 g/dL — ABNORMAL LOW (ref 13.0–17.0)
Immature Granulocytes: 0 %
Lymphocytes Relative: 29 %
Lymphs Abs: 2.2 10*3/uL (ref 0.7–4.0)
MCH: 31 pg (ref 26.0–34.0)
MCHC: 32.8 g/dL (ref 30.0–36.0)
MCV: 94.3 fL (ref 80.0–100.0)
Monocytes Absolute: 0.9 10*3/uL (ref 0.1–1.0)
Monocytes Relative: 12 %
Neutro Abs: 4.3 10*3/uL (ref 1.7–7.7)
Neutrophils Relative %: 58 %
Platelets: 293 10*3/uL (ref 150–400)
RBC: 3.36 MIL/uL — ABNORMAL LOW (ref 4.22–5.81)
RDW: 16.4 % — ABNORMAL HIGH (ref 11.5–15.5)
WBC: 7.5 10*3/uL (ref 4.0–10.5)
nRBC: 0 % (ref 0.0–0.2)

## 2018-09-22 LAB — PSA: Prostatic Specific Antigen: 0.72 ng/mL (ref 0.00–4.00)

## 2018-09-22 MED ORDER — SODIUM CHLORIDE 0.9% FLUSH
10.0000 mL | INTRAVENOUS | Status: DC | PRN
  Administered 2018-09-22: 10 mL
  Filled 2018-09-22: qty 10

## 2018-09-22 MED ORDER — HEPARIN SOD (PORK) LOCK FLUSH 100 UNIT/ML IV SOLN
500.0000 [IU] | Freq: Once | INTRAVENOUS | Status: AC | PRN
  Administered 2018-09-22: 500 [IU]

## 2018-09-22 MED ORDER — FAMOTIDINE IN NACL 20-0.9 MG/50ML-% IV SOLN
20.0000 mg | Freq: Once | INTRAVENOUS | Status: AC
  Administered 2018-09-22: 10:00:00 20 mg via INTRAVENOUS

## 2018-09-22 MED ORDER — PEGFILGRASTIM 6 MG/0.6ML ~~LOC~~ PSKT
PREFILLED_SYRINGE | SUBCUTANEOUS | Status: AC
  Filled 2018-09-22: qty 0.6

## 2018-09-22 MED ORDER — DIPHENHYDRAMINE HCL 50 MG/ML IJ SOLN
25.0000 mg | Freq: Once | INTRAMUSCULAR | Status: AC
  Administered 2018-09-22: 09:00:00 25 mg via INTRAVENOUS

## 2018-09-22 MED ORDER — SODIUM CHLORIDE 0.9 % IV SOLN
20.0000 mg/m2 | Freq: Once | INTRAVENOUS | Status: AC
  Administered 2018-09-22: 41 mg via INTRAVENOUS
  Filled 2018-09-22: qty 4.1

## 2018-09-22 MED ORDER — DENOSUMAB 120 MG/1.7ML ~~LOC~~ SOLN
120.0000 mg | Freq: Once | SUBCUTANEOUS | Status: AC
  Administered 2018-09-22: 120 mg via SUBCUTANEOUS

## 2018-09-22 MED ORDER — PEGFILGRASTIM 6 MG/0.6ML ~~LOC~~ PSKT
6.0000 mg | PREFILLED_SYRINGE | Freq: Once | SUBCUTANEOUS | Status: AC
  Administered 2018-09-22: 6 mg via SUBCUTANEOUS

## 2018-09-22 MED ORDER — FAMOTIDINE IN NACL 20-0.9 MG/50ML-% IV SOLN
INTRAVENOUS | Status: AC
  Filled 2018-09-22: qty 50

## 2018-09-22 MED ORDER — SODIUM CHLORIDE 0.9 % IV SOLN
Freq: Once | INTRAVENOUS | Status: AC
  Administered 2018-09-22: 09:00:00 via INTRAVENOUS

## 2018-09-22 MED ORDER — DIPHENHYDRAMINE HCL 50 MG/ML IJ SOLN
INTRAMUSCULAR | Status: AC
  Filled 2018-09-22: qty 1

## 2018-09-22 MED ORDER — CYANOCOBALAMIN 1000 MCG/ML IJ SOLN
1000.0000 ug | Freq: Once | INTRAMUSCULAR | Status: AC
  Administered 2018-09-22: 1000 ug via INTRAMUSCULAR

## 2018-09-22 MED ORDER — SODIUM CHLORIDE 0.9 % IV SOLN
10.0000 mg | Freq: Once | INTRAVENOUS | Status: AC
  Administered 2018-09-22: 10 mg via INTRAVENOUS
  Filled 2018-09-22: qty 1

## 2018-09-22 MED ORDER — CYANOCOBALAMIN 1000 MCG/ML IJ SOLN
INTRAMUSCULAR | Status: AC
  Filled 2018-09-22: qty 1

## 2018-09-22 MED ORDER — DENOSUMAB 120 MG/1.7ML ~~LOC~~ SOLN
SUBCUTANEOUS | Status: AC
  Filled 2018-09-22: qty 1.7

## 2018-09-22 NOTE — Patient Instructions (Signed)
[facility] at [facility] Discharge Instructions  You were seen today by Dr. Katragadda. He went over your recent lab results. He will see you back in 3 weeks for labs and follow up.   Thank you for choosing [facility] at [facility] to provide your oncology and hematology care.  To afford each patient quality time with our provider, please arrive at least 15 minutes before your scheduled appointment time.   If you have a lab appointment with the Cancer Center please come in thru the  Main Entrance and check in at the main information desk  You need to re-schedule your appointment should you arrive 10 or more minutes late.  We strive to give you quality time with our providers, and arriving late affects you and other patients whose appointments are after yours.  Also, if you no show three or more times for appointments you may be dismissed from the clinic at the providers discretion.     Again, thank you for choosing [facility].  Our hope is that these requests will decrease the amount of time that you wait before being seen by our physicians.       _____________________________________________________________  Should you have questions after your visit to [facility], please contact our office at [phone] between the hours of 8:00 a.m. and 4:30 p.m.  Voicemails left after 4:00 p.m. will not be returned until the following business day.  For prescription refill requests, have your pharmacy contact our office and allow 72 hours.    Cancer Center Support Programs:   > Cancer Support Group  2nd Tuesday of the month 1pm-2pm, Journey Room    

## 2018-09-22 NOTE — Progress Notes (Signed)
Pt presents today for treatment and [place] visit. VSS. No complaints of any changes since the last visit.   Message received to proceed with treatment. Creat. 1.55. Reported to Dr.Katragadda and ATravis LPN via instant message. Labs reviewed by MD and proceed with treatment order received via instant message.   Treatment given today per MD orders. Tolerated infusion without adverse affects. Vital signs stable. No complaints at this time. Discharged from clinic ambulatory. F/U with [facility] as scheduled.

## 2018-09-22 NOTE — Progress Notes (Signed)
Jeff [name], [place] [address]   CLINIC:  Medical Oncology/Hematology  PCP:  Jeff Bouche, NP (Inactive) No address on file None   REASON FOR VISIT:  Follow-up for prostate cancer.   BRIEF ONCOLOGIC HISTORY:    Malignant neoplasm of prostate ([place])   01/26/2014 Tumor Marker    PSA > 5000    01/28/2014 Initial Biopsy    Metastatic adenocarcinoma of prostate    02/05/2014 Surgery    Bilateral orchiectomy by Jeff [name]    02/26/2014 Tumor Marker    PSA = 399.5    03/14/2014 Tumor Marker    PSA= 89.51    03/14/2014 - 06/26/2014 Chemotherapy    Docetaxel 75 mg/kg every 21 days x 6 cycles    04/24/2014 Tumor Marker    PSA- 19.89    07/23/2014 Procedure    Nephrostomy tube removed by IR, Dr. Geroge [name]    07/24/2014 Tumor Marker    PSA= 6.15    10/16/2014 Tumor Marker    PSA: 3.54     01/08/2015 Tumor Marker    PSA: 2.13     01/17/2015 Imaging    CT CAP-  Massive pelvic and retroperitoneal lymphadenopathy noted on the prior study has nearly completely resolved, now with only a small amount of residual amorphous soft tissue predominantly around the the infrarenal abdominal aorta.    01/17/2015 Imaging    Bone scan- Widespread osseous metastatic disease with multiple foci of increased activity throughout the skeleton, corresponding with the findings on the prior CT from October, 2015.    04/11/2015 Tumor Marker    PSA: 1.87     07/21/2015 Imaging    Bone scan- Bony metastatic disease again noted at multiple sites, stable from prior study. No progression of bony metastatic disease is demonstrable on this study.    07/25/2015 Imaging    CT CAP- Stable matted soft tissue density in the retroperitoneum and extraperitoneal pelvis consistent with treated disease. No recurrent lymphadenopathy.  No pulmonary metastatic disease. Diffuse stable sclerotic metastatic bone disease.    01/14/2016 Imaging    Bone scan-  Multiple sites of  abnormal increased tracer localization involving BILATERAL ribs, T11, T12, questionably RIGHT scapula and LEFT iliac bone suspicious for osseous metastases.    01/23/2016 Imaging    CT CAP- 1. Similar widespread osseous metastasis. 2. Similar soft tissue thickening within the retroperitoneum of the abdomen. Improved left pelvic side wall soft tissue thickening. These are consistent with sites of treated disease. No well-defined adenopathy. 3. No new sites of disease.    05/03/2016 -  Chemotherapy    Jevtana every 21 days     05/03/2016 -  Chemotherapy    The patient had pegfilgrastim (NEULASTA) injection 6 mg, 6 mg, Subcutaneous,  Once, 1 of 1 cycle Administration: 6 mg (05/05/2016) pegfilgrastim (NEULASTA ONPRO KIT) injection 6 mg, 6 mg, Subcutaneous, Once, 35 of 38 cycles Administration: 6 mg (05/24/2016), 6 mg (06/14/2016), 6 mg (07/07/2016), 6 mg (07/28/2016), 6 mg (08/18/2016), 6 mg (09/08/2016), 6 mg (09/29/2016), 6 mg (10/20/2016), 6 mg (11/10/2016), 6 mg (02/14/2017), 6 mg (03/07/2017), 6 mg (03/28/2017), 6 mg (04/20/2017), 6 mg (05/11/2017), 6 mg (06/27/2017), 6 mg (08/30/2017), 6 mg (09/20/2017), 6 mg (10/11/2017), 6 mg (11/02/2017), 6 mg (08/09/2017), 6 mg (11/23/2017), 6 mg (12/14/2017), 6 mg (01/09/2018), 6 mg (01/31/2018), 6 mg (04/04/2018), 6 mg (04/27/2018), 6 mg (05/18/2018), 6 mg (06/08/2018), 6 mg (06/29/2018), 6 mg (07/20/2018), 6 mg (08/10/2018), 6 mg (09/01/2018) cabazitaxel (  JEVTANA) 41 mg in dextrose 5 % 250 mL chemo infusion, 20 mg/m2 = 41 mg (100 % of original dose 20 mg/m2), Intravenous,  Once, 36 of 39 cycles Dose modification: 20 mg/m2 (original dose 20 mg/m2, Cycle 1, Reason: Dose not tolerated, Comment: recommended by Data to use 81m/m) Administration: 41 mg (05/03/2016), 41 mg (05/24/2016), 41 mg (06/14/2016), 41 mg (07/07/2016), 41 mg (07/28/2016), 41 mg (08/18/2016), 41 mg (09/08/2016), 41 mg (09/29/2016), 41 mg (10/20/2016), 41 mg (11/10/2016), 41 mg (02/14/2017), 41 mg (03/07/2017), 41 mg (03/28/2017), 41 mg  (04/20/2017), 41 mg (05/11/2017), 41 mg (06/27/2017), 41 mg (08/30/2017), 41 mg (09/20/2017), 41 mg (10/11/2017), 41 mg (11/02/2017), 41 mg (08/09/2017), 41 mg (11/23/2017), 41 mg (12/14/2017), 41 mg (01/09/2018), 41 mg (01/31/2018), 41 mg (02/21/2018), 41 mg (04/04/2018), 41 mg (04/27/2018), 41 mg (05/18/2018), 41 mg (06/08/2018), 41 mg (06/29/2018), 41 mg (07/20/2018), 41 mg (08/10/2018), 41 mg (09/01/2018), 41 mg (09/22/2018)  for chemotherapy treatment.     09/06/2016 Imaging    Restaging CT C/A/P: IMPRESSION: 1. Similar appearance of widespread sclerotic bone metastases. 2. No change in soft tissue thickening within the retroperitoneum and left pelvic sidewall. No well defined adenopathy identified. 3. No new sites of disease 4. Aortic Atherosclerosis (ICD10-I70.0). Coronary artery calcifications noted. 5. 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 6. Stable 9 mm pancreatic cystic lesion. Favor pseudocyst. Indolent neoplasm may look similar. Attention on follow-up imaging.    09/06/2016 Imaging    Bone Scan: IMPRESSION: In this patient with known diffuse sclerotic metastatic disease, bone scan does not reveal progression of radiotracer uptake. Several of the areas of previously demonstrated radiotracer uptake appear less prominent.  Change in appearance of radiotracer uptake involving the mandible now more notable on the right and previously more notable on the left may reflect result of dental disease rather than metastatic disease.     04/08/2017 Tumor Marker    PSA 0.89      CANCER STAGING: Cancer Staging No matching staging information was found for the patient.   INTERVAL HISTORY:  [name]89y.o. male returns for routine follow-up and consideration for next cycle of chemotherapy. He is here today alone. He states that he has been doing great since his last visit. Denies any nausea, vomiting, or diarrhea. Denies any new pains.  Had not noticed any recent bleeding such as epistaxis, hematuria or hematochezia. Denies recent chest pain on exertion, pre-syncopal episodes, or palpitations. Denies any numbness or tingling in hands or feet. Denies any recent fevers, infections, or recent hospitalizations. Patient reports appetite at 100% and energy level at 50%.     REVIEW OF SYSTEMS:  Review of Systems  Respiratory: Positive for shortness of breath.      PAST MEDICAL/SURGICAL HISTORY:  Past Medical History:  Diagnosis Date   Acute renal failure ([facility] 01/26/2014   Alcohol abuse    Anemia of chronic disease 01/26/2014   Bilateral hydronephrosis 01/26/2014   History of cocaine abuse ([facility] 01/26/2014   Homelessness 01/31/2014   Prostate cancer metastatic to multiple sites ([facility] 01/30/2014   Sepsis ([facility]    UTI (lower urinary tract infection)    Past Surgical History:  Procedure Laterality Date   ORCHIECTOMY Bilateral 02/05/2014   Procedure: BILATERAL ORCHIECTOMY;  Surgeon: MFestus Aloe MD;  Location: [facility];  Service: Urology;  Laterality: Bilateral;   PERCUTANEOUS NEPHROSTOMY Bilateral    IR Dr. EJunious Silkchanged on 04/22/2014   PORT-A-CATH REMOVAL Right 08/08/2017   Procedure: REMOVAL PORT-A-CATH RIGHT  CHEST (PROCEDURE #2);  Surgeon: Aviva Signs, MD;  Location: [facility];  Service: General;  Laterality: Right;   PORTACATH PLACEMENT Right 03/12/14   PORTACATH PLACEMENT Left 08/08/2017   Procedure: INSERTION POWER PORT WITH ATTACHED CATHETER LEFT SUBCLAVIAN (PROCEDURE #1);  Surgeon: Aviva Signs, MD;  Location: [facility];  Service: General;  Laterality: Left;     SOCIAL HISTORY:  Social History   Socioeconomic History   Marital status: Single    Spouse name: Not on file   Number of children: 1   Years of education: 9th   Highest education level: Not on file  Occupational History   Occupation: disabilty    Employer: [employer] resource strain: Not on file    Food insecurity:    Worry: Not on file    Inability: Not on file   Transportation needs:    Medical: Not on file    Non-medical: Not on file  Tobacco Use   Smoking status: Current Every Day Smoker    Packs/day: 0.25    Years: 55.00    Pack years: 13.75    Types: Cigarettes   Smokeless tobacco: Never Used  Substance and Sexual Activity   Alcohol use: Yes    Alcohol/week: 1.0 standard drinks    Types: 1 Cans of beer per week   Drug use: Yes    Types: Cocaine    Comment: last used Cocaine 01/25/14   Sexual activity: Yes  Lifestyle   Physical activity:    Days per week: Not on file    Minutes per session: Not on file   Stress: Not on file  Relationships   Social connections:    Talks on phone: Not on file    Gets together: Not on file    Attends religious service: Not on file    Active member of club or organization: Not on file    Attends meetings of clubs or organizations: Not on file    Relationship status: Not on file   Intimate partner violence:    Fear of current or ex partner: Not on file    Emotionally abused: Not on file    Physically abused: Not on file    Forced sexual activity: Not on file  Other Topics Concern   Not on file  Social History Narrative   Lives at [employer] 9th grade   1 son, lives in [place], [place]   Can read and write in native language             FAMILY HISTORY:  Family History  Problem Relation Age of Onset   Cancer Brother 57    CURRENT MEDICATIONS:  Outpatient Encounter Medications as of 09/22/2018  Medication Sig Note   calcium gluconate 500 MG tablet Take 1 tablet by mouth 2 (two) times daily.    prochlorperazine (COMPAZINE) 10 MG tablet The day after chemo take 1 tab four times a day x 2 days. Then may take 1 tab four times a day if needed for nausea/vomiting. 07/24/2014: Just takes as needed   Facility-Administered Encounter Medications as of 09/22/2018  Medication   cyanocobalamin  ((VITAMIN B-12)) injection 1,000 mcg   denosumab (XGEVA) injection 120 mg    ALLERGIES:  No Known Allergies   PHYSICAL EXAM:  ECOG Performance status: 1  Vitals:   09/22/18 0811  BP: 117/83  Pulse: 98  Resp: 18  Temp: 97.6 F (36.4 C)  SpO2: 100%  Filed Weights   09/22/18 0811  Weight: 164 lb (74.4 kg)    Physical Exam Vitals signs reviewed.  Constitutional:      Appearance: Normal appearance.  Cardiovascular:     Rate and Rhythm: Normal rate and regular rhythm.     Heart sounds: Normal heart sounds.  Pulmonary:     Breath sounds: Normal breath sounds.  Abdominal:     General: There is no distension.     Palpations: Abdomen is soft. There is no mass.  Musculoskeletal:        General: No swelling.  Skin:    General: Skin is warm.  Neurological:     General: No focal deficit present.     Mental Status: He is alert and oriented to person, place, and time.  Psychiatric:        Mood and Affect: Mood normal.        Behavior: Behavior normal.      LABORATORY DATA:  I have reviewed the labs as listed.  CBC    Component Value Date/Time   WBC 7.5 09/22/2018 0807   RBC 3.36 (L) 09/22/2018 0807   HGB 10.4 (L) 09/22/2018 0807   HCT 31.7 (L) 09/22/2018 0807   PLT 293 09/22/2018 0807   MCV 94.3 09/22/2018 0807   MCH 31.0 09/22/2018 0807   MCHC 32.8 09/22/2018 0807   RDW 16.4 (H) 09/22/2018 0807   LYMPHSABS 2.2 09/22/2018 0807   MONOABS 0.9 09/22/2018 0807   EOSABS 0.1 09/22/2018 0807   BASOSABS 0.0 09/22/2018 0807   CMP Latest Ref Rng & Units 09/22/2018 09/01/2018 08/10/2018  Glucose 70 - 99 mg/dL 91 101(H) 96  BUN 8 - 23 mg/dL '15 22 19  ' Creatinine 0.61 - 1.24 mg/dL 1.55(H) 1.95(H) 1.59(H)  Sodium 135 - 145 mmol/L 136 137 134(L)  Potassium 3.5 - 5.1 mmol/L 4.2 4.3 4.2  Chloride 98 - 111 mmol/L 101 106 106  CO2 22 - 32 mmol/L 25 24 21(L)  Calcium 8.9 - 10.3 mg/dL 9.0 8.2(L) 7.9(L)  Total Protein 6.5 - 8.1 g/dL 7.5 7.3 7.6  Total Bilirubin 0.3 - 1.2 mg/dL  0.3 0.3 0.6  Alkaline Phos 38 - 126 U/L 60 59 49  AST 15 - 41 U/L '20 22 25  ' ALT 0 - 44 U/L '15 15 22       ' DIAGNOSTIC IMAGING:  I have independently reviewed the scans and discussed with the patient.   I have reviewed Venita Lick LPN's note and agree with the documentation.  I personally performed a face-to-face visit, made revisions and my assessment and plan is as follows.    ASSESSMENT & PLAN:   Malignant neoplasm of prostate ([place]) 1.  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 -Last bone scan on 07/25/2017 showed mixed response compared to prior scan on 09/06/2016.  Last CT scan on 12/20/2016 did not show any visceral metastasis but showed pulmonary fibrotic changes. -He is continuing to tolerate cabazitaxel very well. -Denies any GI side effects or neuropathy. -PSA is further come down to 0.72. -We will consider scans only if the PSA trends up.  Otherwise he will proceed with cabazitaxel today.  I have reviewed his blood work.   2.  Bone metastasis: -We will continue monthly denosumab.  We will continue calcium and vitamin D.  3.  CKD: -This is been stable with creatinine ranging between 1.6 and 2.0. -He has  normocytic anemia with hemoglobin around 10.3.  We will consider checking his ferritin and iron panel.    Total time spent is 25 minutes with more than 50% of time spent face-to-face discussing treatment plan and coordination of care.    Orders placed this encounter:  No orders of the defined types were placed in this encounter.     Derek Jack, MD [place] [phone]

## 2018-09-22 NOTE — Assessment & Plan Note (Signed)
1.  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 -Last bone scan on 07/25/2017 showed mixed response compared to prior scan on 09/06/2016.  Last CT scan on 12/20/2016 did not show any visceral metastasis but showed pulmonary fibrotic changes. -He is continuing to tolerate cabazitaxel very well. -Denies any GI side effects or neuropathy. -PSA is further come down to 0.72. -We will consider scans only if the PSA trends up.  Otherwise he will proceed with cabazitaxel today.  I have reviewed his blood work.   2.  Bone metastasis: -We will continue monthly denosumab.  We will continue calcium and vitamin D.  3.  CKD: -This is been stable with creatinine ranging between 1.6 and 2.0. -He has normocytic anemia with hemoglobin around 10.3.  We will consider checking his ferritin and iron panel.

## 2018-10-05 ENCOUNTER — Other Ambulatory Visit: Payer: Self-pay | Admitting: Pharmacist

## 2018-10-12 ENCOUNTER — Inpatient Hospital Stay (HOSPITAL_COMMUNITY): Payer: Medicare Other | Attending: Hematology

## 2018-10-12 ENCOUNTER — Other Ambulatory Visit: Payer: Self-pay

## 2018-10-12 ENCOUNTER — Inpatient Hospital Stay (HOSPITAL_COMMUNITY): Payer: Medicare Other | Admitting: Hematology

## 2018-10-12 DIAGNOSIS — C7951 Secondary malignant neoplasm of bone: Secondary | ICD-10-CM | POA: Diagnosis not present

## 2018-10-12 DIAGNOSIS — C61 Malignant neoplasm of prostate: Secondary | ICD-10-CM | POA: Diagnosis present

## 2018-10-12 DIAGNOSIS — N189 Chronic kidney disease, unspecified: Secondary | ICD-10-CM | POA: Insufficient documentation

## 2018-10-12 LAB — CBC WITH DIFFERENTIAL/PLATELET
Abs Immature Granulocytes: 0.02 10*3/uL (ref 0.00–0.07)
Basophils Absolute: 0 10*3/uL (ref 0.0–0.1)
Basophils Relative: 0 %
Eosinophils Absolute: 0.1 10*3/uL (ref 0.0–0.5)
Eosinophils Relative: 1 %
HCT: 34 % — ABNORMAL LOW (ref 39.0–52.0)
Hemoglobin: 10.6 g/dL — ABNORMAL LOW (ref 13.0–17.0)
Immature Granulocytes: 0 %
Lymphocytes Relative: 28 %
Lymphs Abs: 1.6 10*3/uL (ref 0.7–4.0)
MCH: 30.4 pg (ref 26.0–34.0)
MCHC: 31.2 g/dL (ref 30.0–36.0)
MCV: 97.4 fL (ref 80.0–100.0)
Monocytes Absolute: 0.5 10*3/uL (ref 0.1–1.0)
Monocytes Relative: 9 %
Neutro Abs: 3.5 10*3/uL (ref 1.7–7.7)
Neutrophils Relative %: 62 %
Platelets: 311 10*3/uL (ref 150–400)
RBC: 3.49 MIL/uL — ABNORMAL LOW (ref 4.22–5.81)
RDW: 16.6 % — ABNORMAL HIGH (ref 11.5–15.5)
WBC: 5.6 10*3/uL (ref 4.0–10.5)
nRBC: 0 % (ref 0.0–0.2)

## 2018-10-12 LAB — COMPREHENSIVE METABOLIC PANEL
ALT: 12 U/L (ref 0–44)
AST: 19 U/L (ref 15–41)
Albumin: 3.7 g/dL (ref 3.5–5.0)
Alkaline Phosphatase: 60 U/L (ref 38–126)
Anion gap: 9 (ref 5–15)
BUN: 19 mg/dL (ref 8–23)
CO2: 22 mmol/L (ref 22–32)
Calcium: 8.6 mg/dL — ABNORMAL LOW (ref 8.9–10.3)
Chloride: 108 mmol/L (ref 98–111)
Creatinine, Ser: 1.47 mg/dL — ABNORMAL HIGH (ref 0.61–1.24)
GFR calc Af Amer: 55 mL/min — ABNORMAL LOW (ref >60)
GFR calc non Af Amer: 47 mL/min — ABNORMAL LOW (ref >60)
Glucose, Bld: 96 mg/dL (ref 70–99)
Potassium: 4 mmol/L (ref 3.5–5.1)
Sodium: 139 mmol/L (ref 135–145)
Total Bilirubin: 0.4 mg/dL (ref 0.3–1.2)
Total Protein: 7.7 g/dL (ref 6.5–8.1)

## 2018-10-13 ENCOUNTER — Inpatient Hospital Stay (HOSPITAL_COMMUNITY): Payer: Medicare Other | Attending: Nurse Practitioner

## 2018-10-13 ENCOUNTER — Encounter (HOSPITAL_COMMUNITY): Payer: Self-pay

## 2018-10-13 ENCOUNTER — Ambulatory Visit (HOSPITAL_COMMUNITY): Payer: Medicaid Other | Admitting: Nurse Practitioner

## 2018-10-13 ENCOUNTER — Inpatient Hospital Stay (HOSPITAL_BASED_OUTPATIENT_CLINIC_OR_DEPARTMENT_OTHER): Payer: Medicare Other | Admitting: Hematology

## 2018-10-13 ENCOUNTER — Other Ambulatory Visit: Payer: Self-pay

## 2018-10-13 ENCOUNTER — Other Ambulatory Visit (HOSPITAL_COMMUNITY): Payer: Medicaid Other

## 2018-10-13 VITALS — BP 118/84 | HR 67 | Temp 98.1°F | Resp 18 | Wt 164.4 lb

## 2018-10-13 DIAGNOSIS — C61 Malignant neoplasm of prostate: Secondary | ICD-10-CM | POA: Diagnosis present

## 2018-10-13 DIAGNOSIS — E538 Deficiency of other specified B group vitamins: Secondary | ICD-10-CM

## 2018-10-13 DIAGNOSIS — C7951 Secondary malignant neoplasm of bone: Secondary | ICD-10-CM | POA: Diagnosis not present

## 2018-10-13 DIAGNOSIS — M899 Disorder of bone, unspecified: Secondary | ICD-10-CM

## 2018-10-13 DIAGNOSIS — Z5111 Encounter for antineoplastic chemotherapy: Secondary | ICD-10-CM | POA: Insufficient documentation

## 2018-10-13 DIAGNOSIS — Z5189 Encounter for other specified aftercare: Secondary | ICD-10-CM | POA: Insufficient documentation

## 2018-10-13 DIAGNOSIS — N189 Chronic kidney disease, unspecified: Secondary | ICD-10-CM

## 2018-10-13 MED ORDER — DENOSUMAB 120 MG/1.7ML ~~LOC~~ SOLN
120.0000 mg | Freq: Once | SUBCUTANEOUS | Status: AC
  Administered 2018-10-13: 12:00:00 120 mg via SUBCUTANEOUS

## 2018-10-13 MED ORDER — FAMOTIDINE IN NACL 20-0.9 MG/50ML-% IV SOLN
20.0000 mg | Freq: Once | INTRAVENOUS | Status: AC
  Administered 2018-10-13: 09:00:00 20 mg via INTRAVENOUS

## 2018-10-13 MED ORDER — SODIUM CHLORIDE 0.9 % IV SOLN
10.0000 mg | Freq: Once | INTRAVENOUS | Status: AC
  Administered 2018-10-13: 10:00:00 10 mg via INTRAVENOUS
  Filled 2018-10-13: qty 10

## 2018-10-13 MED ORDER — DENOSUMAB 120 MG/1.7ML ~~LOC~~ SOLN
SUBCUTANEOUS | Status: AC
  Filled 2018-10-13: qty 1.7

## 2018-10-13 MED ORDER — FAMOTIDINE IN NACL 20-0.9 MG/50ML-% IV SOLN
INTRAVENOUS | Status: AC
  Filled 2018-10-13: qty 50

## 2018-10-13 MED ORDER — DIPHENHYDRAMINE HCL 50 MG/ML IJ SOLN
25.0000 mg | Freq: Once | INTRAMUSCULAR | Status: AC
  Administered 2018-10-13: 10:00:00 25 mg via INTRAVENOUS
  Filled 2018-10-13: qty 1

## 2018-10-13 MED ORDER — HEPARIN SOD (PORK) LOCK FLUSH 100 UNIT/ML IV SOLN
500.0000 [IU] | Freq: Once | INTRAVENOUS | Status: AC | PRN
  Administered 2018-10-13: 12:00:00 500 [IU]

## 2018-10-13 MED ORDER — CYANOCOBALAMIN 1000 MCG/ML IJ SOLN
1000.0000 ug | Freq: Once | INTRAMUSCULAR | Status: AC
  Administered 2018-10-13: 11:00:00 1000 ug via INTRAMUSCULAR
  Filled 2018-10-13: qty 1

## 2018-10-13 MED ORDER — SODIUM CHLORIDE 0.9% FLUSH
10.0000 mL | INTRAVENOUS | Status: DC | PRN
  Administered 2018-10-13: 09:00:00 10 mL
  Filled 2018-10-13: qty 10

## 2018-10-13 MED ORDER — SODIUM CHLORIDE 0.9 % IV SOLN
20.0000 mg/m2 | Freq: Once | INTRAVENOUS | Status: AC
  Administered 2018-10-13: 41 mg via INTRAVENOUS
  Filled 2018-10-13: qty 4.1

## 2018-10-13 MED ORDER — PEGFILGRASTIM 6 MG/0.6ML ~~LOC~~ PSKT
6.0000 mg | PREFILLED_SYRINGE | Freq: Once | SUBCUTANEOUS | Status: AC
  Administered 2018-10-13: 12:00:00 6 mg via SUBCUTANEOUS
  Filled 2018-10-13: qty 0.6

## 2018-10-13 MED ORDER — PALONOSETRON HCL INJECTION 0.25 MG/5ML
INTRAVENOUS | Status: AC
  Filled 2018-10-13: qty 5

## 2018-10-13 MED ORDER — SODIUM CHLORIDE 0.9 % IV SOLN
Freq: Once | INTRAVENOUS | Status: AC
  Administered 2018-10-13: 09:00:00 via INTRAVENOUS

## 2018-10-13 NOTE — Progress Notes (Signed)
[id number] Labs from 10/12/18 reviewed with [name] NP and pt approved for Jevtana infusion and Xgeva injection today per NP                                                                                       Jeff [name] tolerated Jevtana infusion with Neulasta on-pro and Vit B12 and Xgeva injections well without complaints or incident. Neulasta on-pro applied to pt's left arm with green indicator light flashing upon discharge. Calcium 8.6 today and pt denied any tooth or jaw pain and no recent or future dental visits prior to administering Xgeva.Pt continues to take his Calcium PO as prescribed. VSS upon discharge. Pt discharged self ambulatory in satisfactory condition

## 2018-10-13 NOTE — Assessment & Plan Note (Signed)
1.  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 -Last bone scan on 07/25/2017 showed mixed response compared to prior scan on 09/06/2016.  Last CT scan on 12/20/2016 did not show any visceral metastasis but showed pulmonary fibrotic changes. -He is continuing to tolerate cabazitaxel very well. -Denies any GI side effects or neuropathy. -PSA is further come down to 0.72. -We will consider scans only if the PSA trends up.  Otherwise he will proceed with cabazitaxel today.  I have reviewed his blood work. - Labs are acceptable to proceed with cabazitaxel today. - RTC in 3 weeks.    2.  Bone metastasis: -We will continue monthly denosumab.  We will continue calcium and vitamin D.  3.  CKD: -This is been stable with creatinine ranging between 1.6 and 2.0. -He has normocytic anemia with hemoglobin around 10.6.  We will check his ferritin and iron panel.

## 2018-10-13 NOTE — Progress Notes (Signed)
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 Junious Silk   02/26/2014 Tumor Marker   PSA = 399.5   03/14/2014 Tumor Marker   PSA= 89.51   03/14/2014 - 06/26/2014 Chemotherapy   Docetaxel 75 mg/kg every 21 days x 6 cycles   04/24/2014 Tumor Marker   PSA- 19.89   07/23/2014 Procedure   Nephrostomy tube removed by IR, Dr. Geroge Baseman   07/24/2014 Tumor Marker   PSA= 6.15   10/16/2014 Tumor Marker   PSA: 3.54    01/08/2015 Tumor Marker   PSA: 2.13    01/17/2015 Imaging   CT CAP-  Massive pelvic and retroperitoneal lymphadenopathy noted on the prior study has nearly completely resolved, now with only a small amount of residual amorphous soft tissue predominantly around the the infrarenal abdominal aorta.   01/17/2015 Imaging   Bone scan- Widespread osseous metastatic disease with multiple foci of increased activity throughout the skeleton, corresponding with the findings on the prior CT from October, 2015.   04/11/2015 Tumor Marker   PSA: 1.87    07/21/2015 Imaging   Bone scan- Bony metastatic disease again noted at multiple sites, stable from prior study. No progression of bony metastatic disease is demonstrable on this study.   07/25/2015 Imaging   CT CAP- Stable matted soft tissue density in the retroperitoneum and extraperitoneal pelvis consistent with treated disease. No recurrent lymphadenopathy.  No pulmonary metastatic disease. Diffuse stable  sclerotic metastatic bone disease.   01/14/2016 Imaging   Bone scan-  Multiple sites of abnormal increased tracer localization involving BILATERAL ribs, T11, T12, questionably RIGHT scapula and LEFT iliac bone suspicious for osseous metastases.   01/23/2016 Imaging   CT CAP- 1. Similar widespread osseous metastasis. 2. Similar soft tissue thickening within the retroperitoneum of the abdomen. Improved left pelvic side wall soft tissue thickening. These are consistent with sites of treated disease. No well-defined adenopathy. 3. No new sites of disease.   05/03/2016 -  Chemotherapy   Jevtana every 21 days    05/03/2016 -  Chemotherapy   The patient had pegfilgrastim (NEULASTA) injection 6 mg, 6 mg, Subcutaneous,  Once, 1 of 1 cycle Administration: 6 mg (05/05/2016) pegfilgrastim (NEULASTA ONPRO KIT) injection 6 mg, 6 mg, Subcutaneous, Once, 36 of 38 cycles Administration: 6 mg (05/24/2016), 6 mg (06/14/2016), 6 mg (07/07/2016), 6 mg (07/28/2016), 6 mg (08/18/2016), 6 mg (09/08/2016), 6 mg (09/29/2016), 6 mg (10/20/2016), 6 mg (11/10/2016), 6 mg (02/14/2017), 6 mg (03/07/2017), 6 mg (03/28/2017), 6 mg (04/20/2017), 6 mg (05/11/2017), 6 mg (06/27/2017), 6 mg (08/30/2017), 6 mg (09/20/2017), 6 mg (10/11/2017), 6 mg (11/02/2017), 6 mg (08/09/2017), 6 mg (11/23/2017), 6 mg (12/14/2017), 6 mg (01/09/2018), 6 mg (01/31/2018), 6 mg (04/04/2018), 6 mg (04/27/2018), 6 mg (05/18/2018), 6 mg (06/08/2018), 6 mg (06/29/2018), 6 mg (07/20/2018), 6 mg (08/10/2018), 6 mg (09/01/2018), 6 mg (09/22/2018), 6 mg (10/13/2018) cabazitaxel (JEVTANA) 41 mg in dextrose 5 % 250 mL chemo infusion, 20 mg/m2 = 41 mg (100 % of original  dose 20 mg/m2), Intravenous,  Once, 37 of 39 cycles Dose modification: 20 mg/m2 (original dose 20 mg/m2, Cycle 1, Reason: Dose not tolerated, Comment: recommended by Data to use 46m/m), 20 mg/m2 (original dose 20 mg/m2, Cycle 37, Reason: Other (see comments), Comment: Changed to new orderable) Administration: 41 mg (05/03/2016), 41 mg  (05/24/2016), 41 mg (06/14/2016), 41 mg (07/07/2016), 41 mg (07/28/2016), 41 mg (08/18/2016), 41 mg (09/08/2016), 41 mg (09/29/2016), 41 mg (10/20/2016), 41 mg (11/10/2016), 41 mg (02/14/2017), 41 mg (03/07/2017), 41 mg (03/28/2017), 41 mg (04/20/2017), 41 mg (05/11/2017), 41 mg (06/27/2017), 41 mg (08/30/2017), 41 mg (09/20/2017), 41 mg (10/11/2017), 41 mg (11/02/2017), 41 mg (08/09/2017), 41 mg (11/23/2017), 41 mg (12/14/2017), 41 mg (01/09/2018), 41 mg (01/31/2018), 41 mg (02/21/2018), 41 mg (04/04/2018), 41 mg (04/27/2018), 41 mg (05/18/2018), 41 mg (06/08/2018), 41 mg (06/29/2018), 41 mg (07/20/2018), 41 mg (08/10/2018), 41 mg (09/01/2018), 41 mg (09/22/2018), 41 mg (10/13/2018)  for chemotherapy treatment.    09/06/2016 Imaging   Restaging CT C/A/P: IMPRESSION: 1. Similar appearance of widespread sclerotic bone metastases. 2. No change in soft tissue thickening within the retroperitoneum and left pelvic sidewall. No well defined adenopathy identified. 3. No new sites of disease 4. Aortic Atherosclerosis (ICD10-I70.0). Coronary artery calcifications noted. 5. 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 6. Stable 9 mm pancreatic cystic lesion. Favor pseudocyst. Indolent neoplasm may look similar. Attention on follow-up imaging.   09/06/2016 Imaging   Bone Scan: IMPRESSION: In this patient with known diffuse sclerotic metastatic disease, bone scan does not reveal progression of radiotracer uptake. Several of the areas of previously demonstrated radiotracer uptake appear less prominent.  Change in appearance of radiotracer uptake involving the mandible now more notable on the right and previously more notable on the left may reflect result of dental disease rather than metastatic disease.    04/08/2017 Tumor Marker   PSA 0.89        INTERVAL HISTORY:  [name]89y.o. male today for follow-up.  Reports overall doing well.  Denies any significant  fatigue.  Denies any peripheral neuropathies.  Denies any chest pain, shortness of breath, lightheadedness or dizziness.  Denies any fevers, chills, night sweats.  Appetite is stable.  No change in bowel habits.  No weight loss.  States he is ready to proceed with treatment today.   REVIEW OF SYSTEMS:  Review of Systems  Constitutional: Negative.   HENT:  Negative.   Eyes: Negative.   Respiratory: Negative.   Cardiovascular: Negative.   Gastrointestinal: Negative.   Endocrine: Negative.   Genitourinary: Negative.    Musculoskeletal: Negative.   Skin: Negative.   Neurological: Negative.   Hematological: Negative.   Psychiatric/Behavioral: Negative.      PAST MEDICAL/SURGICAL HISTORY:  Past Medical History:  Diagnosis Date  . Acute renal failure ([facility] 01/26/2014  . Alcohol abuse   . Anemia of chronic disease 01/26/2014  . Bilateral hydronephrosis 01/26/2014  . [facility] of cocaine abuse ([facility] 01/26/2014  . Homelessness 01/31/2014  . Prostate cancer metastatic to multiple sites ([facility] 01/30/2014  . Sepsis ([facility]   . UTI (lower urinary tract infection)    Past Surgical History:  Procedure Laterality Date  . ORCHIECTOMY Bilateral 02/05/2014   Procedure: BILATERAL ORCHIECTOMY;  Surgeon: MFestus Aloe MD;  Location: [facility];  Service: Urology;  Laterality: Bilateral;  . PERCUTANEOUS NEPHROSTOMY Bilateral    IR Dr. EJunious Silkchanged on 04/22/2014  . PORT-A-CATH REMOVAL Right 08/08/2017   Procedure: REMOVAL PORT-A-CATH RIGHT CHEST (PROCEDURE #  2);  Surgeon: Aviva Signs, MD;  Location: [facility];  Service: General;  Laterality: Right;  . PORTACATH PLACEMENT Right 03/12/14  . PORTACATH PLACEMENT Left 08/08/2017   Procedure: INSERTION POWER PORT WITH ATTACHED CATHETER LEFT SUBCLAVIAN (PROCEDURE #1);  Surgeon: Aviva Signs, MD;  Location: [facility];  Service: General;  Laterality: Left;     SOCIAL HISTORY:  Social History   Socioeconomic History  . Marital status: Single    Spouse name: Not  on file  . Number of children: 1  . Years of education: 9th  . Highest education level: Not on file  Occupational History  . Occupation: disabilty    Employer: [employer]  . Financial resource strain: Not on file  . Food insecurity    Worry: Not on file    Inability: Not on file  . Transportation needs    Medical: Not on file    Non-medical: Not on file  Tobacco Use  . Smoking status: Current Every Day Smoker    Packs/day: 0.25    Years: 55.00    Pack years: 13.75    Types: Cigarettes  . Smokeless tobacco: Never Used  Substance and Sexual Activity  . Alcohol use: Yes    Alcohol/week: 1.0 standard drinks    Types: 1 Cans of beer per week  . Drug use: Yes    Types: Cocaine    Comment: last used Cocaine 01/25/14  . Sexual activity: Yes  Lifestyle  . Physical activity    Days per week: Not on file    Minutes per session: Not on file  . Stress: Not on file  Relationships  . Social Herbalist on phone: Not on file    Gets together: Not on file    Attends religious service: Not on file    Active member of club or organization: Not on file    Attends meetings of clubs or organizations: Not on file    Relationship status: Not on file  . Intimate partner violence    Fear of current or ex partner: Not on file    Emotionally abused: Not on file    Physically abused: Not on file    Forced sexual activity: Not on file  Other Topics Concern  . Not on file  Social History Narrative   Lives at [employer] 9th grade   1 son, lives in [place], [place]   Can read and write in native language             FAMILY HISTORY:  Family History  Problem Relation Age of Onset  . Cancer Brother 86    CURRENT MEDICATIONS:  Outpatient Encounter Medications as of 10/13/2018  Medication Sig Note  . calcium gluconate 500 MG tablet Take 1 tablet by mouth 2 (two) times daily.   . prochlorperazine (COMPAZINE) 10 MG tablet The day after chemo  take 1 tab four times a day x 2 days. Then may take 1 tab four times a day if needed for nausea/vomiting. 07/24/2014: Just takes as needed   Facility-Administered Encounter Medications as of 10/13/2018  Medication  . [COMPLETED] 0.9 %  sodium chloride infusion  . [COMPLETED] cabazitaxel (JEVTANA) 41 mg in sodium chloride 0.9 % 250 mL chemo infusion  . cyanocobalamin ((VITAMIN B-12)) injection 1,000 mcg  . [COMPLETED] cyanocobalamin ((VITAMIN B-12)) injection 1,000 mcg  . denosumab (XGEVA) injection 120 mg  . [COMPLETED] denosumab (XGEVA) injection 120 mg  . [DTOIZTIWP]  dexamethasone (DECADRON) 10 mg in sodium chloride 0.9 % 50 mL IVPB  . [COMPLETED] diphenhydrAMINE (BENADRYL) injection 25 mg  . [COMPLETED] famotidine (PEPCID) IVPB 20 mg premix  . [COMPLETED] heparin lock flush 100 unit/mL  . [COMPLETED] pegfilgrastim (NEULASTA ONPRO KIT) injection 6 mg  . sodium chloride flush (NS) 0.9 % injection 10 mL  . palonosetron (ALOXI) 0.25 MG/5ML injection  . denosumab (XGEVA) 120 MG/1.7ML injection    ALLERGIES:  No Known Allergies   PHYSICAL EXAM:  ECOG Performance status: 1  There were no vitals filed for this visit. There were no vitals filed for this visit.  Physical Exam Vitals signs reviewed.  Constitutional:      Appearance: Normal appearance.  HENT:     Head: Normocephalic.     Nose: Nose normal.     Mouth/Throat:     Mouth: Mucous membranes are moist.     Pharynx: Oropharynx is clear.  Eyes:     Extraocular Movements: Extraocular movements intact.     Conjunctiva/sclera: Conjunctivae normal.  Neck:     Musculoskeletal: Normal range of motion.  Cardiovascular:     Rate and Rhythm: Normal rate and regular rhythm.     Pulses: Normal pulses.     Heart sounds: Normal heart sounds.  Pulmonary:     Effort: Pulmonary effort is normal.     Breath sounds: Normal breath sounds.  Abdominal:     General: Bowel sounds are normal.     Palpations: Abdomen is soft.   Musculoskeletal: Normal range of motion.  Skin:    General: Skin is warm.  Neurological:     General: No focal deficit present.     Mental Status: He is alert and oriented to person, place, and time.  Psychiatric:        Mood and Affect: Mood normal.        Behavior: Behavior normal.        Thought Content: Thought content normal.        Judgment: Judgment normal.      LABORATORY DATA:  I have reviewed the labs as listed.  CBC    Component Value Date/Time   WBC 5.6 10/12/2018 1419   RBC 3.49 (L) 10/12/2018 1419   HGB 10.6 (L) 10/12/2018 1419   HCT 34.0 (L) 10/12/2018 1419   PLT 311 10/12/2018 1419   MCV 97.4 10/12/2018 1419   MCH 30.4 10/12/2018 1419   MCHC 31.2 10/12/2018 1419   RDW 16.6 (H) 10/12/2018 1419   LYMPHSABS 1.6 10/12/2018 1419   MONOABS 0.5 10/12/2018 1419   EOSABS 0.1 10/12/2018 1419   BASOSABS 0.0 10/12/2018 1419   CMP Latest Ref Rng & Units 10/12/2018 09/22/2018 09/01/2018  Glucose 70 - 99 mg/dL 96 91 101(H)  BUN 8 - 23 mg/dL '19 15 22  ' Creatinine 0.61 - 1.24 mg/dL 1.47(H) 1.55(H) 1.95(H)  Sodium 135 - 145 mmol/L 139 136 137  Potassium 3.5 - 5.1 mmol/L 4.0 4.2 4.3  Chloride 98 - 111 mmol/L 108 101 106  CO2 22 - 32 mmol/L '22 25 24  ' Calcium 8.9 - 10.3 mg/dL 8.6(L) 9.0 8.2(L)  Total Protein 6.5 - 8.1 g/dL 7.7 7.5 7.3  Total Bilirubin 0.3 - 1.2 mg/dL 0.4 0.3 0.3  Alkaline Phos 38 - 126 U/L 60 60 59  AST 15 - 41 U/L '19 20 22  ' ALT 0 - 44 U/L '12 15 15       ' ASSESSMENT & PLAN:   Malignant neoplasm of prostate ([facility]) 1.  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 -Last bone scan on 07/25/2017 showed mixed response compared to prior scan on 09/06/2016.  Last CT scan on 12/20/2016 did not show any visceral metastasis but showed pulmonary fibrotic changes. -He is continuing to tolerate cabazitaxel very well. -Denies any GI side effects or  neuropathy. -PSA is further come down to 0.72. -We will consider scans only if the PSA trends up.  Otherwise he will proceed with cabazitaxel today.  I have reviewed his blood work. - Labs are acceptable to proceed with cabazitaxel today. - RTC in 3 weeks.    2.  Bone metastasis: -We will continue monthly denosumab.  We will continue calcium and vitamin D.  3.  CKD: -This is been stable with creatinine ranging between 1.6 and 2.0. -He has normocytic anemia with hemoglobin around 10.6.  We will check his ferritin and iron panel.        Orders placed this encounter:  Orders Placed This Encounter  Procedures  . CBC with Differential  . Comprehensive metabolic panel  . Iron and TIBC  . Ferritin  . Vitamin B12  . Folate  . [place], [place] [phone]

## 2018-10-13 NOTE — Patient Instructions (Addendum)
[facility] Discharge Instructions for Patients Receiving Chemotherapy   Beginning January 23rd 2017 lab work for the [facility] will be done in the  Main lab at [facility] on 1st floor. If you have a lab appointment with the [place] please come in thru the  Main Entrance and check in at the main information desk   Today you received the following chemotherapy agents Jevtana as well as Neulasta on-pro,Vit B12 and Xgeva.. Follow-up as scheduled. Call clinic for any questions or concerns  To help prevent nausea and vomiting after your treatment, we encourage you to take your nausea medication   If you develop nausea and vomiting, or diarrhea that is not controlled by your medication, call the clinic.  The clinic phone number is [phone]. Office hours are Monday-Friday 8:30am-5:00pm.  BELOW ARE SYMPTOMS THAT SHOULD BE REPORTED IMMEDIATELY:  *FEVER GREATER THAN 101.0 F  *CHILLS WITH OR WITHOUT FEVER  NAUSEA AND VOMITING THAT IS NOT CONTROLLED WITH YOUR NAUSEA MEDICATION  *UNUSUAL SHORTNESS OF BREATH  *UNUSUAL BRUISING OR BLEEDING  TENDERNESS IN MOUTH AND THROAT WITH OR WITHOUT PRESENCE OF ULCERS  *URINARY PROBLEMS  *BOWEL PROBLEMS  UNUSUAL RASH Items with * indicate a potential emergency and should be followed up as soon as possible. If you have an emergency after office hours please contact your primary care physician or go to the nearest emergency department.  Please call the clinic during office hours if you have any questions or concerns.   You may also contact the Patient Navigator at [phone] should you have any questions or need assistance in obtaining follow up care.      Resources For Cancer Patients and their Caregivers ? American Cancer Society: Can assist with transportation, wigs, general needs, runs Look Good Feel Better.        925-493-8656 ? Cancer Care: Provides financial assistance, online support groups,  medication/co-pay assistance.  1-800-813-HOPE 332 418 1008) ? [place] Assists [place] cancer patients and their families through emotional , educational and financial support.  650-489-7332 ? [place] DSS Where to apply for food stamps, Medicaid and utility assistance. 929-338-6120 ? [facility]: Transportation to medical appointments. 413-802-1187 ? Social Security Administration: May apply for disability if have a Stage IV cancer. 615-759-3332 [phone] ? [employer], Disability and Transit Services: Assists with nutrition, care and transit needs. [phone]

## 2018-11-02 ENCOUNTER — Ambulatory Visit (HOSPITAL_COMMUNITY): Payer: Medicare Other | Admitting: Hematology

## 2018-11-02 ENCOUNTER — Other Ambulatory Visit: Payer: Self-pay

## 2018-11-02 ENCOUNTER — Inpatient Hospital Stay (HOSPITAL_COMMUNITY): Payer: Medicare Other | Attending: Hematology

## 2018-11-02 DIAGNOSIS — N189 Chronic kidney disease, unspecified: Secondary | ICD-10-CM | POA: Diagnosis not present

## 2018-11-02 DIAGNOSIS — C7951 Secondary malignant neoplasm of bone: Secondary | ICD-10-CM | POA: Diagnosis not present

## 2018-11-02 DIAGNOSIS — Z5189 Encounter for other specified aftercare: Secondary | ICD-10-CM | POA: Diagnosis not present

## 2018-11-02 DIAGNOSIS — D649 Anemia, unspecified: Secondary | ICD-10-CM | POA: Insufficient documentation

## 2018-11-02 DIAGNOSIS — C61 Malignant neoplasm of prostate: Secondary | ICD-10-CM | POA: Insufficient documentation

## 2018-11-02 DIAGNOSIS — Z5111 Encounter for antineoplastic chemotherapy: Secondary | ICD-10-CM | POA: Diagnosis not present

## 2018-11-02 LAB — CBC WITH DIFFERENTIAL/PLATELET
Abs Immature Granulocytes: 0.04 10*3/uL (ref 0.00–0.07)
Basophils Absolute: 0 10*3/uL (ref 0.0–0.1)
Basophils Relative: 0 %
Eosinophils Absolute: 0.1 10*3/uL (ref 0.0–0.5)
Eosinophils Relative: 1 %
HCT: 36.7 % — ABNORMAL LOW (ref 39.0–52.0)
Hemoglobin: 11.7 g/dL — ABNORMAL LOW (ref 13.0–17.0)
Immature Granulocytes: 1 %
Lymphocytes Relative: 22 %
Lymphs Abs: 1.7 10*3/uL (ref 0.7–4.0)
MCH: 30.3 pg (ref 26.0–34.0)
MCHC: 31.9 g/dL (ref 30.0–36.0)
MCV: 95.1 fL (ref 80.0–100.0)
Monocytes Absolute: 0.8 10*3/uL (ref 0.1–1.0)
Monocytes Relative: 10 %
Neutro Abs: 5.1 10*3/uL (ref 1.7–7.7)
Neutrophils Relative %: 66 %
Platelets: 286 10*3/uL (ref 150–400)
RBC: 3.86 MIL/uL — ABNORMAL LOW (ref 4.22–5.81)
RDW: 16.2 % — ABNORMAL HIGH (ref 11.5–15.5)
WBC: 7.7 10*3/uL (ref 4.0–10.5)
nRBC: 0 % (ref 0.0–0.2)

## 2018-11-02 LAB — FERRITIN: Ferritin: 195 ng/mL (ref 24–336)

## 2018-11-02 LAB — IRON AND TIBC
Iron: 64 ug/dL (ref 45–182)
Saturation Ratios: 25 % (ref 17.9–39.5)
TIBC: 259 ug/dL (ref 250–450)
UIBC: 195 ug/dL

## 2018-11-02 LAB — COMPREHENSIVE METABOLIC PANEL
ALT: 15 U/L (ref 0–44)
AST: 22 U/L (ref 15–41)
Albumin: 4.4 g/dL (ref 3.5–5.0)
Alkaline Phosphatase: 68 U/L (ref 38–126)
Anion gap: 9 (ref 5–15)
BUN: 43 mg/dL — ABNORMAL HIGH (ref 8–23)
CO2: 23 mmol/L (ref 22–32)
Calcium: 9.7 mg/dL (ref 8.9–10.3)
Chloride: 104 mmol/L (ref 98–111)
Creatinine, Ser: 2.29 mg/dL — ABNORMAL HIGH (ref 0.61–1.24)
GFR calc Af Amer: 32 mL/min — ABNORMAL LOW (ref >60)
GFR calc non Af Amer: 28 mL/min — ABNORMAL LOW (ref >60)
Glucose, Bld: 54 mg/dL — ABNORMAL LOW (ref 70–99)
Potassium: 4.7 mmol/L (ref 3.5–5.1)
Sodium: 136 mmol/L (ref 135–145)
Total Bilirubin: 0.5 mg/dL (ref 0.3–1.2)
Total Protein: 8.7 g/dL — ABNORMAL HIGH (ref 6.5–8.1)

## 2018-11-02 LAB — PSA: Prostatic Specific Antigen: 0.86 ng/mL (ref 0.00–4.00)

## 2018-11-02 LAB — VITAMIN B12: Vitamin B-12: 4273 pg/mL — ABNORMAL HIGH (ref 180–914)

## 2018-11-02 LAB — FOLATE: Folate: 5.4 ng/mL — ABNORMAL LOW (ref >5.9)

## 2018-11-03 ENCOUNTER — Encounter (HOSPITAL_COMMUNITY): Payer: Self-pay

## 2018-11-03 ENCOUNTER — Inpatient Hospital Stay (HOSPITAL_COMMUNITY): Payer: Medicare Other

## 2018-11-03 ENCOUNTER — Other Ambulatory Visit: Payer: Self-pay

## 2018-11-03 VITALS — BP 106/69 | HR 73 | Temp 97.5°F | Resp 18

## 2018-11-03 DIAGNOSIS — C7951 Secondary malignant neoplasm of bone: Secondary | ICD-10-CM | POA: Diagnosis not present

## 2018-11-03 DIAGNOSIS — E538 Deficiency of other specified B group vitamins: Secondary | ICD-10-CM

## 2018-11-03 DIAGNOSIS — N189 Chronic kidney disease, unspecified: Secondary | ICD-10-CM | POA: Diagnosis not present

## 2018-11-03 DIAGNOSIS — M899 Disorder of bone, unspecified: Secondary | ICD-10-CM

## 2018-11-03 DIAGNOSIS — Z5189 Encounter for other specified aftercare: Secondary | ICD-10-CM | POA: Diagnosis not present

## 2018-11-03 DIAGNOSIS — D649 Anemia, unspecified: Secondary | ICD-10-CM | POA: Diagnosis not present

## 2018-11-03 DIAGNOSIS — C61 Malignant neoplasm of prostate: Secondary | ICD-10-CM

## 2018-11-03 DIAGNOSIS — Z5111 Encounter for antineoplastic chemotherapy: Secondary | ICD-10-CM | POA: Diagnosis not present

## 2018-11-03 MED ORDER — DENOSUMAB 120 MG/1.7ML ~~LOC~~ SOLN
SUBCUTANEOUS | Status: AC
  Filled 2018-11-03: qty 1.7

## 2018-11-03 MED ORDER — SODIUM CHLORIDE 0.9% FLUSH
10.0000 mL | INTRAVENOUS | Status: DC | PRN
  Administered 2018-11-03: 10 mL
  Filled 2018-11-03: qty 10

## 2018-11-03 MED ORDER — SODIUM CHLORIDE 0.9 % IV SOLN
INTRAVENOUS | Status: AC
  Administered 2018-11-03: 10:00:00 via INTRAVENOUS

## 2018-11-03 MED ORDER — SODIUM CHLORIDE 0.9 % IV SOLN
20.0000 mg/m2 | Freq: Once | INTRAVENOUS | Status: AC
  Administered 2018-11-03: 41 mg via INTRAVENOUS
  Filled 2018-11-03: qty 4.1

## 2018-11-03 MED ORDER — SODIUM CHLORIDE 0.9 % IV SOLN
10.0000 mg | Freq: Once | INTRAVENOUS | Status: AC
  Administered 2018-11-03: 10 mg via INTRAVENOUS
  Filled 2018-11-03: qty 10

## 2018-11-03 MED ORDER — DIPHENHYDRAMINE HCL 50 MG/ML IJ SOLN
25.0000 mg | Freq: Once | INTRAMUSCULAR | Status: AC
  Administered 2018-11-03: 25 mg via INTRAVENOUS
  Filled 2018-11-03: qty 1

## 2018-11-03 MED ORDER — FAMOTIDINE IN NACL 20-0.9 MG/50ML-% IV SOLN
20.0000 mg | Freq: Once | INTRAVENOUS | Status: AC
  Administered 2018-11-03: 20 mg via INTRAVENOUS
  Filled 2018-11-03: qty 50

## 2018-11-03 MED ORDER — SODIUM CHLORIDE 0.9 % IV SOLN
Freq: Once | INTRAVENOUS | Status: AC
  Administered 2018-11-03: 10:00:00 via INTRAVENOUS

## 2018-11-03 MED ORDER — HEPARIN SOD (PORK) LOCK FLUSH 100 UNIT/ML IV SOLN
500.0000 [IU] | Freq: Once | INTRAVENOUS | Status: AC | PRN
  Administered 2018-11-03: 500 [IU]

## 2018-11-03 MED ORDER — CYANOCOBALAMIN 1000 MCG/ML IJ SOLN
1000.0000 ug | Freq: Once | INTRAMUSCULAR | Status: AC
  Administered 2018-11-03: 1000 ug via INTRAMUSCULAR
  Filled 2018-11-03: qty 1

## 2018-11-03 MED ORDER — PEGFILGRASTIM 6 MG/0.6ML ~~LOC~~ PSKT
6.0000 mg | PREFILLED_SYRINGE | Freq: Once | SUBCUTANEOUS | Status: AC
  Administered 2018-11-03: 6 mg via SUBCUTANEOUS
  Filled 2018-11-03: qty 0.6

## 2018-11-03 MED ORDER — DENOSUMAB 120 MG/1.7ML ~~LOC~~ SOLN
120.0000 mg | Freq: Once | SUBCUTANEOUS | Status: AC
  Administered 2018-11-03: 120 mg via SUBCUTANEOUS
  Filled 2018-11-03: qty 1.7

## 2018-11-03 NOTE — Progress Notes (Signed)
8016 Labs,including Creatinine of 2.29, from 11/02/18 reviewed with R.Nester NP today and pt approved for Jevtana infusion with extra IV hydration of 500 ml NS over 1 hour per NP.                                                                             Jeff [name] tolerated Jevtana infusion and Xgeva and Vit B12 injections well without complaints or incident. Calcium 9.7 and pt denied any tooth or jaw pain and no recent or future dental visits prior to administering Xgeva injection.Pt continues to take his Calcium PO as prescribed without issues. VSS upon discharge. Pt discharged self ambulatory in satisfactory condition

## 2018-11-03 NOTE — Patient Instructions (Signed)
[facility] Discharge Instructions for Patients Receiving Chemotherapy   Beginning January 23rd 2017 lab work for the [facility] will be done in the  Main lab at [facility] on 1st floor. If you have a lab appointment with the [place] please come in thru the  Main Entrance and check in at the main information desk   Today you received the following chemotherapy agents Jevtana with Neulasta on-pro as well as Xgeva and Vit B12 injections. Follow-up as scheduled. Call clinic for any questions or concerns  To help prevent nausea and vomiting after your treatment, we encourage you to take your nausea medication   If you develop nausea and vomiting, or diarrhea that is not controlled by your medication, call the clinic.  The clinic phone number is [phone]. Office hours are Monday-Friday 8:30am-5:00pm.  BELOW ARE SYMPTOMS THAT SHOULD BE REPORTED IMMEDIATELY:  *FEVER GREATER THAN 101.0 F  *CHILLS WITH OR WITHOUT FEVER  NAUSEA AND VOMITING THAT IS NOT CONTROLLED WITH YOUR NAUSEA MEDICATION  *UNUSUAL SHORTNESS OF BREATH  *UNUSUAL BRUISING OR BLEEDING  TENDERNESS IN MOUTH AND THROAT WITH OR WITHOUT PRESENCE OF ULCERS  *URINARY PROBLEMS  *BOWEL PROBLEMS  UNUSUAL RASH Items with * indicate a potential emergency and should be followed up as soon as possible. If you have an emergency after office hours please contact your primary care physician or go to the nearest emergency department.  Please call the clinic during office hours if you have any questions or concerns.   You may also contact the Patient Navigator at [phone] should you have any questions or need assistance in obtaining follow up care.      Resources For Cancer Patients and their Caregivers ? American Cancer Society: Can assist with transportation, wigs, general needs, runs Look Good Feel Better.        601-023-9963 ? Cancer Care: Provides financial assistance, online support  groups, medication/co-pay assistance.  1-800-813-HOPE 989 848 5519) ? [place] Assists [place] cancer patients and their families through emotional , educational and financial support.  (661)165-4209 ? [place] DSS Where to apply for food stamps, Medicaid and utility assistance. 331-452-5135 ? [facility]: Transportation to medical appointments. (435)357-9556 ? Social Security Administration: May apply for disability if have a Stage IV cancer. (319) [phone] [phone])[phone] ? [employer], Disability and Transit Services: Assists with nutrition, care and transit needs. [phone]

## 2018-11-03 NOTE — Progress Notes (Signed)
Neulasta on-pro applied to pt's left arm with green indicator light flashing upon discharge

## 2018-11-23 ENCOUNTER — Other Ambulatory Visit: Payer: Self-pay

## 2018-11-23 ENCOUNTER — Inpatient Hospital Stay (HOSPITAL_COMMUNITY): Payer: Medicare Other

## 2018-11-23 DIAGNOSIS — Z5189 Encounter for other specified aftercare: Secondary | ICD-10-CM | POA: Diagnosis not present

## 2018-11-23 DIAGNOSIS — C7951 Secondary malignant neoplasm of bone: Secondary | ICD-10-CM | POA: Diagnosis not present

## 2018-11-23 DIAGNOSIS — D649 Anemia, unspecified: Secondary | ICD-10-CM | POA: Diagnosis not present

## 2018-11-23 DIAGNOSIS — N189 Chronic kidney disease, unspecified: Secondary | ICD-10-CM | POA: Diagnosis not present

## 2018-11-23 DIAGNOSIS — C61 Malignant neoplasm of prostate: Secondary | ICD-10-CM

## 2018-11-23 DIAGNOSIS — Z5111 Encounter for antineoplastic chemotherapy: Secondary | ICD-10-CM | POA: Diagnosis not present

## 2018-11-23 LAB — CBC WITH DIFFERENTIAL/PLATELET
Abs Immature Granulocytes: 0.1 10*3/uL — ABNORMAL HIGH (ref 0.00–0.07)
Basophils Absolute: 0 10*3/uL (ref 0.0–0.1)
Basophils Relative: 0 %
Eosinophils Absolute: 0 10*3/uL (ref 0.0–0.5)
Eosinophils Relative: 0 %
HCT: 35.5 % — ABNORMAL LOW (ref 39.0–52.0)
Hemoglobin: 11.2 g/dL — ABNORMAL LOW (ref 13.0–17.0)
Immature Granulocytes: 1 %
Lymphocytes Relative: 20 %
Lymphs Abs: 1.8 10*3/uL (ref 0.7–4.0)
MCH: 30.1 pg (ref 26.0–34.0)
MCHC: 31.5 g/dL (ref 30.0–36.0)
MCV: 95.4 fL (ref 80.0–100.0)
Monocytes Absolute: 0.8 10*3/uL (ref 0.1–1.0)
Monocytes Relative: 9 %
Neutro Abs: 6.3 10*3/uL (ref 1.7–7.7)
Neutrophils Relative %: 70 %
Platelets: 295 10*3/uL (ref 150–400)
RBC: 3.72 MIL/uL — ABNORMAL LOW (ref 4.22–5.81)
RDW: 16.7 % — ABNORMAL HIGH (ref 11.5–15.5)
WBC: 9 10*3/uL (ref 4.0–10.5)
nRBC: 0 % (ref 0.0–0.2)

## 2018-11-23 LAB — COMPREHENSIVE METABOLIC PANEL
ALT: 16 U/L (ref 0–44)
AST: 22 U/L (ref 15–41)
Albumin: 4.1 g/dL (ref 3.5–5.0)
Alkaline Phosphatase: 61 U/L (ref 38–126)
Anion gap: 7 (ref 5–15)
BUN: 18 mg/dL (ref 8–23)
CO2: 20 mmol/L — ABNORMAL LOW (ref 22–32)
Calcium: 8.1 mg/dL — ABNORMAL LOW (ref 8.9–10.3)
Chloride: 108 mmol/L (ref 98–111)
Creatinine, Ser: 1.51 mg/dL — ABNORMAL HIGH (ref 0.61–1.24)
GFR calc Af Amer: 53 mL/min — ABNORMAL LOW (ref >60)
GFR calc non Af Amer: 46 mL/min — ABNORMAL LOW (ref >60)
Glucose, Bld: 97 mg/dL (ref 70–99)
Potassium: 4.4 mmol/L (ref 3.5–5.1)
Sodium: 135 mmol/L (ref 135–145)
Total Bilirubin: 0.4 mg/dL (ref 0.3–1.2)
Total Protein: 7.7 g/dL (ref 6.5–8.1)

## 2018-11-24 ENCOUNTER — Inpatient Hospital Stay (HOSPITAL_BASED_OUTPATIENT_CLINIC_OR_DEPARTMENT_OTHER): Payer: Medicare Other | Admitting: Hematology

## 2018-11-24 ENCOUNTER — Encounter (HOSPITAL_COMMUNITY): Payer: Self-pay

## 2018-11-24 ENCOUNTER — Inpatient Hospital Stay (HOSPITAL_COMMUNITY): Payer: Medicare Other

## 2018-11-24 VITALS — BP 113/75 | HR 72 | Temp 97.5°F | Resp 18 | Wt 159.2 lb

## 2018-11-24 DIAGNOSIS — D649 Anemia, unspecified: Secondary | ICD-10-CM | POA: Diagnosis not present

## 2018-11-24 DIAGNOSIS — Z5189 Encounter for other specified aftercare: Secondary | ICD-10-CM | POA: Diagnosis not present

## 2018-11-24 DIAGNOSIS — C61 Malignant neoplasm of prostate: Secondary | ICD-10-CM

## 2018-11-24 DIAGNOSIS — C7951 Secondary malignant neoplasm of bone: Secondary | ICD-10-CM

## 2018-11-24 DIAGNOSIS — N189 Chronic kidney disease, unspecified: Secondary | ICD-10-CM | POA: Diagnosis not present

## 2018-11-24 DIAGNOSIS — Z5111 Encounter for antineoplastic chemotherapy: Secondary | ICD-10-CM | POA: Diagnosis not present

## 2018-11-24 MED ORDER — SODIUM CHLORIDE 0.9 % IV SOLN
Freq: Once | INTRAVENOUS | Status: AC
  Administered 2018-11-24: 10:00:00 via INTRAVENOUS

## 2018-11-24 MED ORDER — HEPARIN SOD (PORK) LOCK FLUSH 100 UNIT/ML IV SOLN
500.0000 [IU] | Freq: Once | INTRAVENOUS | Status: AC | PRN
  Administered 2018-11-24: 12:00:00 500 [IU]

## 2018-11-24 MED ORDER — PEGFILGRASTIM 6 MG/0.6ML ~~LOC~~ PSKT
6.0000 mg | PREFILLED_SYRINGE | Freq: Once | SUBCUTANEOUS | Status: AC
  Administered 2018-11-24: 6 mg via SUBCUTANEOUS
  Filled 2018-11-24: qty 0.6

## 2018-11-24 MED ORDER — SODIUM CHLORIDE 0.9% FLUSH
10.0000 mL | INTRAVENOUS | Status: DC | PRN
  Administered 2018-11-24: 10 mL
  Filled 2018-11-24: qty 10

## 2018-11-24 MED ORDER — FAMOTIDINE IN NACL 20-0.9 MG/50ML-% IV SOLN
20.0000 mg | Freq: Once | INTRAVENOUS | Status: AC
  Administered 2018-11-24: 20 mg via INTRAVENOUS
  Filled 2018-11-24: qty 50

## 2018-11-24 MED ORDER — SODIUM CHLORIDE 0.9 % IV SOLN
20.0000 mg/m2 | Freq: Once | INTRAVENOUS | Status: AC
  Administered 2018-11-24: 11:00:00 41 mg via INTRAVENOUS
  Filled 2018-11-24: qty 4.1

## 2018-11-24 MED ORDER — SODIUM CHLORIDE 0.9 % IV SOLN
10.0000 mg | Freq: Once | INTRAVENOUS | Status: AC
  Administered 2018-11-24: 10 mg via INTRAVENOUS
  Filled 2018-11-24: qty 10

## 2018-11-24 MED ORDER — DIPHENHYDRAMINE HCL 50 MG/ML IJ SOLN
25.0000 mg | Freq: Once | INTRAMUSCULAR | Status: AC
  Administered 2018-11-24: 10:00:00 25 mg via INTRAVENOUS
  Filled 2018-11-24: qty 1

## 2018-11-24 NOTE — Progress Notes (Signed)
Labs reviewed with NP today. Will proceed with treatment today.    [name] KitchenAdriana Reams arrived today for [facility] neulasta on body injector. See MAR for administration details. Injector in place and engaged with green light indicator on flashing. Tolerated application with out problems.  Treatment given per orders. Patient tolerated it well without problems. Vitals stable and discharged home from clinic ambulatory. Follow up as scheduled.

## 2018-11-24 NOTE — Assessment & Plan Note (Signed)
1.  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 -Last bone scan on 07/25/2017 showed mixed response compared to prior scan on 09/06/2016.  Last CT scan on 12/20/2016 did not show any visceral metastasis but showed pulmonary fibrotic changes. -He is continuing to tolerate cabazitaxel very well. -Denies any GI side effects or neuropathy. -We will consider scans only if the PSA trends up.  -PSA remains within normal range.  Labs are acceptable to proceed with Cabazitaxel today. -She will return to clinic in 3 weeks.  2.  Bone metastasis: -We will continue monthly denosumab.  We will continue calcium and vitamin D.  3.  CKD: -This is been stable with creatinine ranging between 1.6 and 2.0. -He has normocytic anemia with hemoglobin around 10.6.  We will check his ferritin and iron panel.

## 2018-11-24 NOTE — Progress Notes (Signed)
Jeff [name], [place] [address]   CLINIC:  Medical Oncology/Hematology  PCP:  Jeff Bouche, NP (Inactive) No address on file None   REASON FOR VISIT:  Follow-up for prostate cancer  CURRENT THERAPY: Cabazitaxel   BRIEF ONCOLOGIC HISTORY:  Oncology History  Malignant neoplasm of prostate ([place])  01/26/2014 Tumor Marker   PSA > 5000   01/28/2014 Initial Biopsy   Metastatic adenocarcinoma of prostate   02/05/2014 Surgery   Bilateral orchiectomy by Jeff [name]   02/26/2014 Tumor Marker   PSA = 399.5   03/14/2014 Tumor Marker   PSA= 89.51   03/14/2014 - 06/26/2014 Chemotherapy   Docetaxel 75 mg/kg every 21 days x 6 cycles   04/24/2014 Tumor Marker   PSA- 19.89   07/23/2014 Procedure   Nephrostomy tube removed by IR, Dr. Geroge [name]   07/24/2014 Tumor Marker   PSA= 6.15   10/16/2014 Tumor Marker   PSA: 3.54    01/08/2015 Tumor Marker   PSA: 2.13    01/17/2015 Imaging   CT CAP-  Massive pelvic and retroperitoneal lymphadenopathy noted on the prior study has nearly completely resolved, now with only a small amount of residual amorphous soft tissue predominantly around the the infrarenal abdominal aorta.   01/17/2015 Imaging   Bone scan- Widespread osseous metastatic disease with multiple foci of increased activity throughout the skeleton, corresponding with the findings on the prior CT from October, 2015.   04/11/2015 Tumor Marker   PSA: 1.87    07/21/2015 Imaging   Bone scan- Bony metastatic disease again noted at multiple sites, stable from prior study. No progression of bony metastatic disease is demonstrable on this study.   07/25/2015 Imaging   CT CAP- Stable matted soft tissue density in the retroperitoneum and extraperitoneal pelvis consistent with treated disease. No recurrent lymphadenopathy.  No pulmonary metastatic disease. Diffuse stable sclerotic metastatic bone disease.   01/14/2016 Imaging   Bone scan-  Multiple  sites of abnormal increased tracer localization involving BILATERAL ribs, T11, T12, questionably RIGHT scapula and LEFT iliac bone suspicious for osseous metastases.   01/23/2016 Imaging   CT CAP- 1. Similar widespread osseous metastasis. 2. Similar soft tissue thickening within the retroperitoneum of the abdomen. Improved left pelvic side wall soft tissue thickening. These are consistent with sites of treated disease. No well-defined adenopathy. 3. No new sites of disease.   05/03/2016 -  Chemotherapy   Jevtana every 21 days    05/03/2016 -  Chemotherapy   The patient had pegfilgrastim (NEULASTA) injection 6 mg, 6 mg, Subcutaneous,  Once, 1 of 1 cycle Administration: 6 mg (05/05/2016) pegfilgrastim (NEULASTA ONPRO KIT) injection 6 mg, 6 mg, Subcutaneous, Once, 38 of 38 cycles Administration: 6 mg (05/24/2016), 6 mg (06/14/2016), 6 mg (07/07/2016), 6 mg (07/28/2016), 6 mg (08/18/2016), 6 mg (09/08/2016), 6 mg (09/29/2016), 6 mg (10/20/2016), 6 mg (11/10/2016), 6 mg (02/14/2017), 6 mg (03/07/2017), 6 mg (03/28/2017), 6 mg (04/20/2017), 6 mg (05/11/2017), 6 mg (06/27/2017), 6 mg (08/30/2017), 6 mg (09/20/2017), 6 mg (10/11/2017), 6 mg (11/02/2017), 6 mg (08/09/2017), 6 mg (11/23/2017), 6 mg (12/14/2017), 6 mg (01/09/2018), 6 mg (01/31/2018), 6 mg (04/04/2018), 6 mg (04/27/2018), 6 mg (05/18/2018), 6 mg (06/08/2018), 6 mg (06/29/2018), 6 mg (07/20/2018), 6 mg (08/10/2018), 6 mg (09/01/2018), 6 mg (09/22/2018), 6 mg (10/13/2018), 6 mg (11/03/2018) cabazitaxel (JEVTANA) 41 mg in dextrose 5 % 250 mL chemo infusion, 20 mg/m2 = 41 mg (100 % of original dose 20 mg/m2), Intravenous,  Once,  39 of 39 cycles Dose modification: 20 mg/m2 (original dose 20 mg/m2, Cycle 1, Reason: Dose not tolerated, Comment: recommended by Data to use 28m/m), 20 mg/m2 (original dose 20 mg/m2, Cycle 37, Reason: Other (see comments), Comment: Changed to new orderable) Administration: 41 mg (05/03/2016), 41 mg (05/24/2016), 41 mg (06/14/2016), 41 mg (07/07/2016), 41 mg  (07/28/2016), 41 mg (08/18/2016), 41 mg (09/08/2016), 41 mg (09/29/2016), 41 mg (10/20/2016), 41 mg (11/10/2016), 41 mg (02/14/2017), 41 mg (03/07/2017), 41 mg (03/28/2017), 41 mg (04/20/2017), 41 mg (05/11/2017), 41 mg (06/27/2017), 41 mg (08/30/2017), 41 mg (09/20/2017), 41 mg (10/11/2017), 41 mg (11/02/2017), 41 mg (08/09/2017), 41 mg (11/23/2017), 41 mg (12/14/2017), 41 mg (01/09/2018), 41 mg (01/31/2018), 41 mg (02/21/2018), 41 mg (04/04/2018), 41 mg (04/27/2018), 41 mg (05/18/2018), 41 mg (06/08/2018), 41 mg (06/29/2018), 41 mg (07/20/2018), 41 mg (08/10/2018), 41 mg (09/01/2018), 41 mg (09/22/2018), 41 mg (10/13/2018), 41 mg (11/03/2018)  for chemotherapy treatment.    09/06/2016 Imaging   Restaging CT C/A/P: IMPRESSION: 1. Similar appearance of widespread sclerotic bone metastases. 2. No change in soft tissue thickening within the retroperitoneum and left pelvic sidewall. No well defined adenopathy identified. 3. No new sites of disease 4. Aortic Atherosclerosis (ICD10-I70.0). Coronary artery calcifications noted. 5. 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 6. Stable 9 mm pancreatic cystic lesion. Favor pseudocyst. Indolent neoplasm may look similar. Attention on follow-up imaging.   09/06/2016 Imaging   Bone Scan: IMPRESSION: In this patient with known diffuse sclerotic metastatic disease, bone scan does not reveal progression of radiotracer uptake. Several of the areas of previously demonstrated radiotracer uptake appear less prominent.  Change in appearance of radiotracer uptake involving the mandible now more notable on the right and previously more notable on the left may reflect result of dental disease rather than metastatic disease.    04/08/2017 Tumor Marker   PSA 0.89        INTERVAL HISTORY:  [name]89y.o. male presents today for follow-up.  He reports overall doing well.  Denies any significant fatigue.  He continues to  tolerate treatment exceptionally well.  Denies any nausea, vomiting, diarrhea.  Appetite remains stable.  He denies any fevers or signs and symptoms of infection.  No recent hospitalizations.  He states he is ready to proceed with treatment today.    REVIEW OF SYSTEMS:  Review of Systems  All other systems reviewed and are negative.    PAST MEDICAL/SURGICAL HISTORY:  Past Medical History:  Diagnosis Date  . [place] renal failure ([facility] 01/26/2014  . Alcohol abuse   . Anemia of chronic disease 01/26/2014  . Bilateral hydronephrosis 01/26/2014  . [place] of [place] abuse ([facility] 01/26/2014  . Homelessness 01/31/2014  . Prostate cancer metastatic to multiple sites ([facility] 01/30/2014  . Sepsis ([facility]   . UTI (lower urinary tract infection)    Past Surgical History:  Procedure Laterality Date  . ORCHIECTOMY Bilateral 02/05/2014   Procedure: BILATERAL ORCHIECTOMY;  Surgeon: MFestus Aloe MD;  Location: [facility];  Service: Urology;  Laterality: Bilateral;  . PERCUTANEOUS NEPHROSTOMY Bilateral    IR Dr. EJunious Silkchanged on 04/22/2014  . PORT-A-CATH REMOVAL Right 08/08/2017   Procedure: REMOVAL PORT-A-CATH RIGHT CHEST (PROCEDURE #2);  Surgeon: JAviva Signs MD;  Location: [facility];  Service: General;  Laterality: Right;  . PORTACATH PLACEMENT Right 03/12/14  . PORTACATH PLACEMENT Left 08/08/2017   Procedure: INSERTION POWER PORT WITH ATTACHED CATHETER LEFT SUBCLAVIAN (PROCEDURE #1);  Surgeon: JAviva Signs MD;  Location: [facility];  Service: General;  Laterality: Left;     SOCIAL HISTORY:  Social History   Socioeconomic History  . Marital status: Single    Spouse name: Not on file  . Number of children: 1  . Years of education: 9th  . Highest education level: Not on file  Occupational History  . Occupation: disabilty    Employer: [employer]  . Financial resource strain: Not on file  . Food insecurity    Worry: Not on file    Inability: Not on file  . Transportation  needs    Medical: Not on file    Non-medical: Not on file  Tobacco Use  . Smoking status: Current Every Day Smoker    Packs/day: 0.25    Years: 55.00    Pack years: 13.75    Types: Cigarettes  . Smokeless tobacco: Never Used  Substance and Sexual Activity  . Alcohol use: Yes    Alcohol/week: 1.0 standard drinks    Types: 1 Cans of beer per week  . Drug use: Yes    Types: Cocaine    Comment: last used Cocaine 01/25/14  . Sexual activity: Yes  Lifestyle  . Physical activity    Days per week: Not on file    Minutes per session: Not on file  . Stress: Not on file  Relationships  . Social Herbalist on phone: Not on file    Gets together: Not on file    Attends religious service: Not on file    Active member of club or organization: Not on file    Attends meetings of clubs or organizations: Not on file    Relationship status: Not on file  . Intimate partner violence    Fear of current or ex partner: Not on file    Emotionally abused: Not on file    Physically abused: Not on file    Forced sexual activity: Not on file  Other Topics Concern  . Not on file  Social History Narrative   Lives at [employer]   1 son, lives in [place], [place]   Can read and write in native language             FAMILY HISTORY:  Family History  Problem Relation Age of Onset  . Cancer Brother 45    CURRENT MEDICATIONS:  Outpatient Encounter Medications as of 11/24/2018  Medication Sig Note  . calcium gluconate 500 MG tablet Take 1 tablet by mouth 2 (two) times daily.   . prochlorperazine (COMPAZINE) 10 MG tablet The day after chemo take 1 tab four times a day x 2 days. Then may take 1 tab four times a day if needed for nausea/vomiting. (Patient not taking: Reported on 11/24/2018) 07/24/2014: Just takes as needed   Facility-Administered Encounter Medications as of 11/24/2018  Medication  . [COMPLETED] 0.9 %  sodium chloride infusion  . cabazitaxel (JEVTANA)  41 mg in sodium chloride 0.9 % 250 mL chemo infusion  . cyanocobalamin ((VITAMIN B-12)) injection 1,000 mcg  . denosumab (XGEVA) injection 120 mg  . [COMPLETED] dexamethasone (DECADRON) 10 mg in sodium chloride 0.9 % 50 mL IVPB  . [COMPLETED] diphenhydrAMINE (BENADRYL) injection 25 mg  . [COMPLETED] famotidine (PEPCID) IVPB 20 mg premix  . heparin lock flush 100 unit/mL  . pegfilgrastim (NEULASTA ONPRO KIT) injection 6 mg  . sodium chloride flush (NS) 0.9 % injection 10 mL    ALLERGIES:  No Known  Allergies   PHYSICAL EXAM:  ECOG Performance status: 1  There were no vitals filed for this visit. There were no vitals filed for this visit.  Physical Exam Constitutional:      Appearance: Normal appearance.  HENT:     Head: Normocephalic.     Right Ear: External ear normal.     Left Ear: External ear normal.     Nose: Nose normal.     Mouth/Throat:     Mouth: Mucous membranes are moist.     Pharynx: Oropharynx is clear.  Eyes:     Extraocular Movements: Extraocular movements intact.     Conjunctiva/sclera: Conjunctivae normal.  Neck:     Musculoskeletal: Normal range of motion.  Cardiovascular:     Rate and Rhythm: Normal rate and regular rhythm.     Pulses: Normal pulses.     Heart sounds: Normal heart sounds.  Pulmonary:     Effort: Pulmonary effort is normal.     Breath sounds: Normal breath sounds.  Abdominal:     General: Bowel sounds are normal.     Palpations: Abdomen is soft.  Musculoskeletal: Normal range of motion.  Skin:    General: Skin is warm and dry.  Neurological:     General: No focal deficit present.     Mental Status: He is alert and oriented to person, place, and time.  Psychiatric:        Mood and Affect: Mood normal.        Behavior: Behavior normal.        Thought Content: Thought content normal.        Judgment: Judgment normal.      LABORATORY DATA:  I have reviewed the labs as listed.  CBC    Component Value Date/Time   WBC 9.0  11/23/2018 1311   RBC 3.72 (L) 11/23/2018 1311   HGB 11.2 (L) 11/23/2018 1311   HCT 35.5 (L) 11/23/2018 1311   PLT 295 11/23/2018 1311   MCV 95.4 11/23/2018 1311   MCH 30.1 11/23/2018 1311   MCHC 31.5 11/23/2018 1311   RDW 16.7 (H) 11/23/2018 1311   LYMPHSABS 1.8 11/23/2018 1311   MONOABS 0.8 11/23/2018 1311   EOSABS 0.0 11/23/2018 1311   BASOSABS 0.0 11/23/2018 1311   CMP Latest Ref Rng & Units 11/23/2018 11/02/2018 10/12/2018  Glucose 70 - 99 mg/dL 97 54(L) 96  BUN 8 - 23 mg/dL 18 43(H) 19  Creatinine 0.61 - 1.24 mg/dL 1.51(H) 2.29(H) 1.47(H)  Sodium 135 - 145 mmol/L 135 136 139  Potassium 3.5 - 5.1 mmol/L 4.4 4.7 4.0  Chloride 98 - 111 mmol/L 108 104 108  CO2 22 - 32 mmol/L 20(L) 23 22  Calcium 8.9 - 10.3 mg/dL 8.1(L) 9.7 8.6(L)  Total Protein 6.5 - 8.1 g/dL 7.7 8.7(H) 7.7  Total Bilirubin 0.3 - 1.2 mg/dL 0.4 0.5 0.4  Alkaline Phos 38 - 126 U/L 61 68 60  AST 15 - 41 U/L _0 ALT 0 - 44 U/L _1 ASSESSMENT & PLAN:   Malignant neoplasm of prostate ([facility]) 1.  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 -Last bone scan on 07/25/2017 showed mixed response compared to prior scan on 09/06/2016.  Last CT scan on 12/20/2016 did not show any visceral metastasis but showed pulmonary fibrotic changes. -He is continuing to tolerate cabazitaxel  very well. -Denies any GI side effects or neuropathy. -We will consider scans only if the PSA trends up.  -PSA remains within normal range.  Labs are acceptable to proceed with Cabazitaxel today. -She will return to clinic in 3 weeks.  2.  Bone metastasis: -We will continue monthly denosumab.  We will continue calcium and vitamin D.  3.  CKD: -This is been stable with creatinine ranging between 1.6 and 2.0. -He has normocytic anemia with hemoglobin around 10.6.  We will check his ferritin and iron panel.         Orders placed this encounter:  Orders Placed This Encounter  Procedures  . CBC with Differential  . Comprehensive metabolic panel  . [place], [place] [phone]

## 2018-11-24 NOTE — Patient Instructions (Signed)
[facility] Discharge Instructions for Patients Receiving Chemotherapy  Today you received the following chemotherapy agents   To help prevent nausea and vomiting after your treatment, we encourage you to take your nausea medication   If you develop nausea and vomiting that is not controlled by your nausea medication, call the clinic.   BELOW ARE SYMPTOMS THAT SHOULD BE REPORTED IMMEDIATELY:  *FEVER GREATER THAN 100.5 F  *CHILLS WITH OR WITHOUT FEVER  NAUSEA AND VOMITING THAT IS NOT CONTROLLED WITH YOUR NAUSEA MEDICATION  *UNUSUAL SHORTNESS OF BREATH  *UNUSUAL BRUISING OR BLEEDING  TENDERNESS IN MOUTH AND THROAT WITH OR WITHOUT PRESENCE OF ULCERS  *URINARY PROBLEMS  *BOWEL PROBLEMS  UNUSUAL RASH Items with * indicate a potential emergency and should be followed up as soon as possible.  Feel free to call the clinic should you have any questions or concerns. The clinic phone number is [phone].  Please show the CHEMO ALERT CARD at check-in to the Emergency Department and triage nurse.   

## 2018-11-29 ENCOUNTER — Encounter: Payer: Self-pay | Admitting: General Practice

## 2018-11-29 NOTE — Progress Notes (Unsigned)
[facility] CSW Progress Notes  Call to patient to request permission to refer to [employer], left VM, awaiting return call.  Edwyna Shell, LCSW Clinical Social Worker Phone:  [phone]

## 2018-11-30 ENCOUNTER — Inpatient Hospital Stay (HOSPITAL_COMMUNITY): Payer: Medicare Other | Attending: Hematology

## 2018-11-30 ENCOUNTER — Inpatient Hospital Stay (HOSPITAL_COMMUNITY): Payer: Medicare Other

## 2018-11-30 ENCOUNTER — Other Ambulatory Visit: Payer: Self-pay

## 2018-11-30 DIAGNOSIS — N189 Chronic kidney disease, unspecified: Secondary | ICD-10-CM | POA: Insufficient documentation

## 2018-11-30 DIAGNOSIS — C61 Malignant neoplasm of prostate: Secondary | ICD-10-CM | POA: Insufficient documentation

## 2018-11-30 DIAGNOSIS — C7951 Secondary malignant neoplasm of bone: Secondary | ICD-10-CM | POA: Diagnosis not present

## 2018-11-30 DIAGNOSIS — Z5111 Encounter for antineoplastic chemotherapy: Secondary | ICD-10-CM | POA: Insufficient documentation

## 2018-11-30 DIAGNOSIS — D649 Anemia, unspecified: Secondary | ICD-10-CM | POA: Insufficient documentation

## 2018-11-30 DIAGNOSIS — Z5189 Encounter for other specified aftercare: Secondary | ICD-10-CM | POA: Insufficient documentation

## 2018-11-30 LAB — COMPREHENSIVE METABOLIC PANEL
ALT: 17 U/L (ref 0–44)
AST: 17 U/L (ref 15–41)
Albumin: 3.9 g/dL (ref 3.5–5.0)
Alkaline Phosphatase: 116 U/L (ref 38–126)
Anion gap: 5 (ref 5–15)
BUN: 25 mg/dL — ABNORMAL HIGH (ref 8–23)
CO2: 22 mmol/L (ref 22–32)
Calcium: 7.9 mg/dL — ABNORMAL LOW (ref 8.9–10.3)
Chloride: 110 mmol/L (ref 98–111)
Creatinine, Ser: 1.47 mg/dL — ABNORMAL HIGH (ref 0.61–1.24)
GFR calc Af Amer: 55 mL/min — ABNORMAL LOW (ref >60)
GFR calc non Af Amer: 47 mL/min — ABNORMAL LOW (ref >60)
Glucose, Bld: 94 mg/dL (ref 70–99)
Potassium: 5 mmol/L (ref 3.5–5.1)
Sodium: 137 mmol/L (ref 135–145)
Total Bilirubin: 0.3 mg/dL (ref 0.3–1.2)
Total Protein: 7.7 g/dL (ref 6.5–8.1)

## 2018-11-30 NOTE — Progress Notes (Signed)
Jeff [name] presents today for Xgeva injection. Ca 7.9. Albumin 3.9. Corrected Ca 7.9. Per Dr. Delton Coombes, hold Delton See injection today. Pt reminded to take Calcium supplements every day as directed. Understanding verbalized. Discharged in satisfactory condition with follow up instructions.

## 2018-12-14 ENCOUNTER — Inpatient Hospital Stay (HOSPITAL_COMMUNITY): Payer: Medicare Other

## 2018-12-14 ENCOUNTER — Other Ambulatory Visit: Payer: Self-pay

## 2018-12-14 DIAGNOSIS — D649 Anemia, unspecified: Secondary | ICD-10-CM | POA: Diagnosis not present

## 2018-12-14 DIAGNOSIS — C7951 Secondary malignant neoplasm of bone: Secondary | ICD-10-CM | POA: Diagnosis not present

## 2018-12-14 DIAGNOSIS — C61 Malignant neoplasm of prostate: Secondary | ICD-10-CM

## 2018-12-14 DIAGNOSIS — Z5111 Encounter for antineoplastic chemotherapy: Secondary | ICD-10-CM | POA: Diagnosis not present

## 2018-12-14 DIAGNOSIS — N189 Chronic kidney disease, unspecified: Secondary | ICD-10-CM | POA: Diagnosis not present

## 2018-12-14 DIAGNOSIS — Z5189 Encounter for other specified aftercare: Secondary | ICD-10-CM | POA: Diagnosis not present

## 2018-12-14 LAB — COMPREHENSIVE METABOLIC PANEL
ALT: 21 U/L (ref 0–44)
AST: 23 U/L (ref 15–41)
Albumin: 3.9 g/dL (ref 3.5–5.0)
Alkaline Phosphatase: 73 U/L (ref 38–126)
Anion gap: 9 (ref 5–15)
BUN: 26 mg/dL — ABNORMAL HIGH (ref 8–23)
CO2: 22 mmol/L (ref 22–32)
Calcium: 8.5 mg/dL — ABNORMAL LOW (ref 8.9–10.3)
Chloride: 105 mmol/L (ref 98–111)
Creatinine, Ser: 2.1 mg/dL — ABNORMAL HIGH (ref 0.61–1.24)
GFR calc Af Amer: 36 mL/min — ABNORMAL LOW (ref >60)
GFR calc non Af Amer: 31 mL/min — ABNORMAL LOW (ref >60)
Glucose, Bld: 84 mg/dL (ref 70–99)
Potassium: 5 mmol/L (ref 3.5–5.1)
Sodium: 136 mmol/L (ref 135–145)
Total Bilirubin: 0.4 mg/dL (ref 0.3–1.2)
Total Protein: 8 g/dL (ref 6.5–8.1)

## 2018-12-14 LAB — CBC WITH DIFFERENTIAL/PLATELET
Abs Immature Granulocytes: 0.06 10*3/uL (ref 0.00–0.07)
Basophils Absolute: 0 10*3/uL (ref 0.0–0.1)
Basophils Relative: 0 %
Eosinophils Absolute: 0.1 10*3/uL (ref 0.0–0.5)
Eosinophils Relative: 1 %
HCT: 34.1 % — ABNORMAL LOW (ref 39.0–52.0)
Hemoglobin: 10.8 g/dL — ABNORMAL LOW (ref 13.0–17.0)
Immature Granulocytes: 1 %
Lymphocytes Relative: 14 %
Lymphs Abs: 1.3 10*3/uL (ref 0.7–4.0)
MCH: 30.3 pg (ref 26.0–34.0)
MCHC: 31.7 g/dL (ref 30.0–36.0)
MCV: 95.8 fL (ref 80.0–100.0)
Monocytes Absolute: 0.8 10*3/uL (ref 0.1–1.0)
Monocytes Relative: 9 %
Neutro Abs: 7.1 10*3/uL (ref 1.7–7.7)
Neutrophils Relative %: 75 %
Platelets: 292 10*3/uL (ref 150–400)
RBC: 3.56 MIL/uL — ABNORMAL LOW (ref 4.22–5.81)
RDW: 17.2 % — ABNORMAL HIGH (ref 11.5–15.5)
WBC: 9.4 10*3/uL (ref 4.0–10.5)
nRBC: 0 % (ref 0.0–0.2)

## 2018-12-15 ENCOUNTER — Inpatient Hospital Stay (HOSPITAL_BASED_OUTPATIENT_CLINIC_OR_DEPARTMENT_OTHER): Payer: Medicare Other | Admitting: Nurse Practitioner

## 2018-12-15 ENCOUNTER — Inpatient Hospital Stay (HOSPITAL_COMMUNITY): Payer: Medicare Other

## 2018-12-15 ENCOUNTER — Other Ambulatory Visit (HOSPITAL_COMMUNITY): Payer: Self-pay | Admitting: Nurse Practitioner

## 2018-12-15 VITALS — BP 109/75 | HR 73 | Temp 97.7°F | Resp 18

## 2018-12-15 DIAGNOSIS — M899 Disorder of bone, unspecified: Secondary | ICD-10-CM

## 2018-12-15 DIAGNOSIS — N189 Chronic kidney disease, unspecified: Secondary | ICD-10-CM | POA: Diagnosis not present

## 2018-12-15 DIAGNOSIS — C61 Malignant neoplasm of prostate: Secondary | ICD-10-CM

## 2018-12-15 DIAGNOSIS — Z5189 Encounter for other specified aftercare: Secondary | ICD-10-CM | POA: Diagnosis not present

## 2018-12-15 DIAGNOSIS — D649 Anemia, unspecified: Secondary | ICD-10-CM | POA: Diagnosis not present

## 2018-12-15 DIAGNOSIS — Z5111 Encounter for antineoplastic chemotherapy: Secondary | ICD-10-CM | POA: Diagnosis not present

## 2018-12-15 DIAGNOSIS — E538 Deficiency of other specified B group vitamins: Secondary | ICD-10-CM

## 2018-12-15 DIAGNOSIS — C7951 Secondary malignant neoplasm of bone: Secondary | ICD-10-CM | POA: Diagnosis not present

## 2018-12-15 MED ORDER — HEPARIN SOD (PORK) LOCK FLUSH 100 UNIT/ML IV SOLN
500.0000 [IU] | Freq: Once | INTRAVENOUS | Status: AC | PRN
  Administered 2018-12-15: 12:00:00 500 [IU]

## 2018-12-15 MED ORDER — FAMOTIDINE IN NACL 20-0.9 MG/50ML-% IV SOLN
20.0000 mg | Freq: Once | INTRAVENOUS | Status: AC
  Administered 2018-12-15: 20 mg via INTRAVENOUS
  Filled 2018-12-15: qty 50

## 2018-12-15 MED ORDER — PEGFILGRASTIM 6 MG/0.6ML ~~LOC~~ PSKT
6.0000 mg | PREFILLED_SYRINGE | Freq: Once | SUBCUTANEOUS | Status: AC
  Administered 2018-12-15: 12:00:00 6 mg via SUBCUTANEOUS
  Filled 2018-12-15: qty 0.6

## 2018-12-15 MED ORDER — CYANOCOBALAMIN 1000 MCG/ML IJ SOLN
1000.0000 ug | Freq: Once | INTRAMUSCULAR | Status: AC
  Administered 2018-12-15: 12:00:00 1000 ug via INTRAMUSCULAR
  Filled 2018-12-15: qty 1

## 2018-12-15 MED ORDER — SODIUM CHLORIDE 0.9 % IV SOLN
Freq: Once | INTRAVENOUS | Status: AC
  Administered 2018-12-15: 09:00:00 via INTRAVENOUS

## 2018-12-15 MED ORDER — SODIUM CHLORIDE 0.9 % IV SOLN
10.0000 mg | Freq: Once | INTRAVENOUS | Status: AC
  Administered 2018-12-15: 10 mg via INTRAVENOUS
  Filled 2018-12-15: qty 10

## 2018-12-15 MED ORDER — DIPHENHYDRAMINE HCL 50 MG/ML IJ SOLN
25.0000 mg | Freq: Once | INTRAMUSCULAR | Status: AC
  Administered 2018-12-15: 10:00:00 25 mg via INTRAVENOUS
  Filled 2018-12-15: qty 1

## 2018-12-15 MED ORDER — SODIUM CHLORIDE 0.9 % IV SOLN
20.0000 mg/m2 | Freq: Once | INTRAVENOUS | Status: AC
  Administered 2018-12-15: 41 mg via INTRAVENOUS
  Filled 2018-12-15: qty 4.1

## 2018-12-15 MED ORDER — DENOSUMAB 120 MG/1.7ML ~~LOC~~ SOLN
120.0000 mg | Freq: Once | SUBCUTANEOUS | Status: AC
  Administered 2018-12-15: 120 mg via SUBCUTANEOUS
  Filled 2018-12-15: qty 1.7

## 2018-12-15 MED ORDER — SODIUM CHLORIDE 0.9% FLUSH
10.0000 mL | INTRAVENOUS | Status: DC | PRN
  Administered 2018-12-15: 09:00:00 10 mL
  Filled 2018-12-15: qty 10

## 2018-12-15 MED ORDER — SODIUM CHLORIDE 0.9 % IV SOLN
INTRAVENOUS | Status: AC
  Administered 2018-12-15 (×2): via INTRAVENOUS

## 2018-12-15 NOTE — Assessment & Plan Note (Signed)
1. 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 -Last bone scan on 07/25/2017 showed mixed response compared to prior scan on 09/06/2016. Last CT scan on 12/20/2016 did not show any visceral metastasis but showed pulmonary fibrotic changes. -He continues to tolerate Cabazitaxel very well. -He denies any GI side effects or neuropathy. -We will consider scans only if the PSA trends up. -Labs from 12/14/2018 showed potassium 5.0, creatinine 2.10, WBC 9.4, hemoglobin 10.8, platelets 292. -PSA remains within normal range.  Labs are acceptable to proceed with cabazitaxel today. -We will give him an extra to 50 cc of normal saline since his creatinine has increased slightly. - He he will return to clinic within 3 weeks with labs and treatment.  2.  Bone metastasis: -We will continue monthly denosumab. -Labs on 12/14/2018 showed his calcium level 8.5 -We will continue calcium and vitamin D daily.  3.  Chronic kidney disease: -This has been stable with a creatinine ranging between 1.6 and 2.0. -He has normocytic anemia with hemoglobin around 10.6. - Labs on 12/14/2018 showed his hemoglobin 10.8 and platelets 292.

## 2018-12-15 NOTE — Progress Notes (Signed)
0905 Labs reviewed with and pt seen by RLockamy NP and pt approved for Jevtana infusion and Xgeva and Vit B12 injections today per NP                                                                             Jeff [name] tolerated Jevtana infusion and injections well without complaints or incident. Calcium 8.5 today and pt denied any tooth or jaw pain and no recent or future dental visits prior to administering the Xgeva injection. Pt continues to take his calcium PO as prescribed without any issues. Neulasta on-pro applied to pt's left arm with green indicator light flashing upon discharge. VSS upon discharge. Pt discharged self ambulatory in satisfactory condition

## 2018-12-15 NOTE — Patient Instructions (Signed)
[facility] at [facility] Discharge Instructions  Follow-up in 3 weeks with labs and treatment.   Thank you for choosing [facility] at [facility] to provide your oncology and hematology care.  To afford each patient quality time with our provider, please arrive at least 15 minutes before your scheduled appointment time.   If you have a lab appointment with the Cancer Center please come in thru the Main Entrance and check in at the main information desk.  You need to re-schedule your appointment should you arrive 10 or more minutes late.  We strive to give you quality time with our providers, and arriving late affects you and other patients whose appointments are after yours.  Also, if you no show three or more times for appointments you may be dismissed from the clinic at the providers discretion.     Again, thank you for choosing [facility].  Our hope is that these requests will decrease the amount of time that you wait before being seen by our physicians.       _____________________________________________________________  Should you have questions after your visit to [facility], please contact our office at [phone] between the hours of 8:00 a.m. and 4:30 p.m.  Voicemails left after 4:00 p.m. will not be returned until the following business day.  For prescription refill requests, have your pharmacy contact our office and allow 72 hours.    Due to Covid, you will need to wear a mask upon entering the hospital. If you do not have a mask, a mask will be given to you at the Main Entrance upon arrival. For doctor visits, patients may have 1 support person with them. For treatment visits, patients can not have anyone with them due to social distancing guidelines and our immunocompromised population.      

## 2018-12-15 NOTE — Progress Notes (Signed)
Jeff [name], [place] [address]   CLINIC:  Medical Oncology/Hematology  PCP:  Jeff Bouche, NP (Inactive) No address on file None   REASON FOR VISIT: Follow-up for prostate cancer  CURRENT THERAPY: Cabazitaxel every 3 weeks   BRIEF ONCOLOGIC HISTORY:  Oncology History  Malignant neoplasm of prostate ([place])  01/26/2014 Tumor Marker   PSA > 5000   01/28/2014 Initial Biopsy   Metastatic adenocarcinoma of prostate   02/05/2014 Surgery   Bilateral orchiectomy by Dr. Junious [name]   02/26/2014 Tumor Marker   PSA = 399.5   03/14/2014 Tumor Marker   PSA= 89.51   03/14/2014 - 06/26/2014 Chemotherapy   Docetaxel 75 mg/kg every 21 days x 6 cycles   04/24/2014 Tumor Marker   PSA- 19.89   07/23/2014 Procedure   Nephrostomy tube removed by IR, Dr. Geroge [name]   07/24/2014 Tumor Marker   PSA= 6.15   10/16/2014 Tumor Marker   PSA: 3.54    01/08/2015 Tumor Marker   PSA: 2.13    01/17/2015 Imaging   CT CAP-  Massive pelvic and retroperitoneal lymphadenopathy noted on the prior study has nearly completely resolved, now with only a small amount of residual amorphous soft tissue predominantly around the the infrarenal abdominal aorta.   01/17/2015 Imaging   Bone scan- Widespread osseous metastatic disease with multiple foci of increased activity throughout the skeleton, corresponding with the findings on the prior CT from October, 2015.   04/11/2015 Tumor Marker   PSA: 1.87    07/21/2015 Imaging   Bone scan- Bony metastatic disease again noted at multiple sites, stable from prior study. No progression of bony metastatic disease is demonstrable on this study.   07/25/2015 Imaging   CT CAP- Stable matted soft tissue density in the retroperitoneum and extraperitoneal pelvis consistent with treated disease. No recurrent lymphadenopathy.  No pulmonary metastatic disease. Diffuse stable sclerotic metastatic bone disease.   01/14/2016 Imaging   Bone  scan-  Multiple sites of abnormal increased tracer localization involving BILATERAL ribs, T11, T12, questionably RIGHT scapula and LEFT iliac bone suspicious for osseous metastases.   01/23/2016 Imaging   CT CAP- 1. Similar widespread osseous metastasis. 2. Similar soft tissue thickening within the retroperitoneum of the abdomen. Improved left pelvic side wall soft tissue thickening. These are consistent with sites of treated disease. No well-defined adenopathy. 3. No new sites of disease.   05/03/2016 -  Chemotherapy   Jevtana every 21 days    05/03/2016 -  Chemotherapy   The patient had pegfilgrastim (NEULASTA) injection 6 mg, 6 mg, Subcutaneous,  Once, 1 of 1 cycle Administration: 6 mg (05/05/2016) pegfilgrastim (NEULASTA ONPRO KIT) injection 6 mg, 6 mg, Subcutaneous, Once, 39 of 41 cycles Administration: 6 mg (05/24/2016), 6 mg (06/14/2016), 6 mg (07/07/2016), 6 mg (07/28/2016), 6 mg (08/18/2016), 6 mg (09/08/2016), 6 mg (09/29/2016), 6 mg (10/20/2016), 6 mg (11/10/2016), 6 mg (02/14/2017), 6 mg (03/07/2017), 6 mg (03/28/2017), 6 mg (04/20/2017), 6 mg (05/11/2017), 6 mg (06/27/2017), 6 mg (08/30/2017), 6 mg (09/20/2017), 6 mg (10/11/2017), 6 mg (11/02/2017), 6 mg (08/09/2017), 6 mg (11/23/2017), 6 mg (12/14/2017), 6 mg (01/09/2018), 6 mg (01/31/2018), 6 mg (04/04/2018), 6 mg (04/27/2018), 6 mg (05/18/2018), 6 mg (06/08/2018), 6 mg (06/29/2018), 6 mg (07/20/2018), 6 mg (08/10/2018), 6 mg (09/01/2018), 6 mg (09/22/2018), 6 mg (10/13/2018), 6 mg (11/03/2018), 6 mg (11/24/2018) cabazitaxel (JEVTANA) 41 mg in dextrose 5 % 250 mL chemo infusion, 20 mg/m2 = 41 mg (100 % of original dose  20 mg/m2), Intravenous,  Once, 40 of 42 cycles Dose modification: 20 mg/m2 (original dose 20 mg/m2, Cycle 1, Reason: Dose not tolerated, Comment: recommended by Data to use 58m/m), 20 mg/m2 (original dose 20 mg/m2, Cycle 37, Reason: Other (see comments), Comment: Changed to new orderable) Administration: 41 mg (05/03/2016), 41 mg (05/24/2016), 41 mg  (06/14/2016), 41 mg (07/07/2016), 41 mg (07/28/2016), 41 mg (08/18/2016), 41 mg (09/08/2016), 41 mg (09/29/2016), 41 mg (10/20/2016), 41 mg (11/10/2016), 41 mg (02/14/2017), 41 mg (03/07/2017), 41 mg (03/28/2017), 41 mg (04/20/2017), 41 mg (05/11/2017), 41 mg (06/27/2017), 41 mg (08/30/2017), 41 mg (09/20/2017), 41 mg (10/11/2017), 41 mg (11/02/2017), 41 mg (08/09/2017), 41 mg (11/23/2017), 41 mg (12/14/2017), 41 mg (01/09/2018), 41 mg (01/31/2018), 41 mg (02/21/2018), 41 mg (04/04/2018), 41 mg (04/27/2018), 41 mg (05/18/2018), 41 mg (06/08/2018), 41 mg (06/29/2018), 41 mg (07/20/2018), 41 mg (08/10/2018), 41 mg (09/01/2018), 41 mg (09/22/2018), 41 mg (10/13/2018), 41 mg (11/03/2018), 41 mg (11/24/2018)  for chemotherapy treatment.    09/06/2016 Imaging   Restaging CT C/A/P: IMPRESSION: 1. Similar appearance of widespread sclerotic bone metastases. 2. No change in soft tissue thickening within the retroperitoneum and left pelvic sidewall. No well defined adenopathy identified. 3. No new sites of disease 4. Aortic Atherosclerosis (ICD10-I70.0). Coronary artery calcifications noted. 5. 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 6. Stable 9 mm pancreatic cystic lesion. Favor pseudocyst. Indolent neoplasm may look similar. Attention on follow-up imaging.   09/06/2016 Imaging   Bone Scan: IMPRESSION: In this patient with known diffuse sclerotic metastatic disease, bone scan does not reveal progression of radiotracer uptake. Several of the areas of previously demonstrated radiotracer uptake appear less prominent.  Change in appearance of radiotracer uptake involving the mandible now more notable on the right and previously more notable on the left may reflect result of dental disease rather than metastatic disease.    04/08/2017 Tumor Marker   PSA 0.89        INTERVAL HISTORY:  [name]89y.o. male returns for routine follow-up for prostate cancer.  Patient  report he has felt fine since his last visit.  He denies any fatigue.  He denies any numbness or tingling in his hands or feet.  He denies any bright red bleeding per rectum or melena. Denies any nausea, vomiting, or diarrhea. Denies any new pains. Had not noticed any recent bleeding such as epistaxis, hematuria or hematochezia. Denies recent chest pain on exertion, shortness of breath on minimal exertion, pre-syncopal episodes, or palpitations. Denies any numbness or tingling in hands or feet. Denies any recent fevers, infections, or recent hospitalizations. Patient reports appetite at 100% and energy level at 75%. He is eating well and maintaining his weight at this time.      REVIEW OF SYSTEMS:  Review of Systems  Respiratory: Positive for cough.   Psychiatric/Behavioral: Positive for sleep disturbance.  All other systems reviewed and are negative.    PAST MEDICAL/SURGICAL HISTORY:  Past Medical History:  Diagnosis Date  . Acute renal failure ([facility] 01/26/2014  . Alcohol abuse   . Anemia of chronic disease 01/26/2014  . Bilateral hydronephrosis 01/26/2014  . History of cocaine abuse ([facility] 01/26/2014  . Homelessness 01/31/2014  . Prostate cancer metastatic to multiple sites ([facility] 01/30/2014  . Sepsis ([facility]   . UTI (lower urinary tract infection)    Past Surgical History:  Procedure Laterality Date  . ORCHIECTOMY Bilateral 02/05/2014   Procedure: BILATERAL ORCHIECTOMY;  Surgeon: MFestus Aloe MD;  [facility];  Service: Urology;  Laterality: Bilateral;  . PERCUTANEOUS NEPHROSTOMY Bilateral    IR Dr. Junious [name] changed on 04/22/2014  . PORT-A-CATH REMOVAL Right 08/08/2017   Procedure: REMOVAL PORT-A-CATH RIGHT CHEST (PROCEDURE #2);  Surgeon: Aviva Signs, MD;  Location: [facility];  Service: General;  Laterality: Right;  . PORTACATH PLACEMENT Right 03/12/14  . PORTACATH PLACEMENT Left 08/08/2017   Procedure: INSERTION POWER PORT WITH ATTACHED CATHETER LEFT SUBCLAVIAN (PROCEDURE #1);   Surgeon: Aviva Signs, MD;  Location: [facility];  Service: General;  Laterality: Left;     SOCIAL HISTORY:  Social History   Socioeconomic History  . Marital status: Single    Spouse name: Not on file  . Number of children: 1  . Years of education: 9th  . Highest education level: Not on file  Occupational History  . Occupation: disabilty    Employer: [employer]  . Financial resource strain: Not on file  . Food insecurity    Worry: Not on file    Inability: Not on file  . Transportation needs    Medical: Not on file    Non-medical: Not on file  Tobacco Use  . Smoking status: Current Every Day Smoker    Packs/day: 0.25    Years: 55.00    Pack years: 13.75    Types: Cigarettes  . Smokeless tobacco: Never Used  Substance and Sexual Activity  . Alcohol use: Yes    Alcohol/week: 1.0 standard drinks    Types: 1 Cans of beer per week  . Drug use: Yes    Types: Cocaine    Comment: last used Cocaine 01/25/14  . Sexual activity: Yes  Lifestyle  . Physical activity    Days per week: Not on file    Minutes per session: Not on file  . Stress: Not on file  Relationships  . Social Herbalist on phone: Not on file    Gets together: Not on file    Attends religious service: Not on file    Active member of club or organization: Not on file    Attends meetings of clubs or organizations: Not on file    Relationship status: Not on file  . Intimate partner violence    Fear of current or ex partner: Not on file    Emotionally abused: Not on file    Physically abused: Not on file    Forced sexual activity: Not on file  Other Topics Concern  . Not on file  Social History Narrative   Lives at [employer]   1 son, lives in [place], [place]   Can read and write in native language             FAMILY HISTORY:  Family History  Problem Relation Age of Onset  . Cancer Brother 83    CURRENT MEDICATIONS:  Outpatient  Encounter Medications as of 12/15/2018  Medication Sig Note  . calcium gluconate 500 MG tablet Take 1 tablet by mouth 2 (two) times daily.   . prochlorperazine (COMPAZINE) 10 MG tablet The day after chemo take 1 tab four times a day x 2 days. Then may take 1 tab four times a day if needed for nausea/vomiting. (Patient not taking: Reported on 11/24/2018) 07/24/2014: Just takes as needed   Facility-Administered Encounter Medications as of 12/15/2018  Medication  . cyanocobalamin ((VITAMIN B-12)) injection 1,000 mcg  . denosumab (XGEVA) injection 120 mg    ALLERGIES:  No Known Allergies   PHYSICAL EXAM:  ECOG Performance status: 1  Vitals:   12/15/18 0824  BP: 105/72  Pulse: 80  Resp: 18  Temp: (!) 97.3 F (36.3 C)  SpO2: 100%   Filed Weights   12/15/18 0824  Weight: 162 lb 9.6 oz (73.8 kg)    Physical Exam Constitutional:      Appearance: Normal appearance. He is normal weight.  Cardiovascular:     Rate and Rhythm: Normal rate and regular rhythm.     Heart sounds: Normal heart sounds.  Pulmonary:     Effort: Pulmonary effort is normal.     Breath sounds: Normal breath sounds.  Abdominal:     General: Bowel sounds are normal.     Palpations: Abdomen is soft.  Musculoskeletal: Normal range of motion.  Skin:    General: Skin is warm and dry.  Neurological:     Mental Status: He is alert and oriented to person, place, and time. Mental status is at baseline.  Psychiatric:        Mood and Affect: Mood normal.        Behavior: Behavior normal.        Thought Content: Thought content normal.        Judgment: Judgment normal.      LABORATORY DATA:  I have reviewed the labs as listed.  CBC    Component Value Date/Time   WBC 9.4 12/14/2018 1343   RBC 3.56 (L) 12/14/2018 1343   HGB 10.8 (L) 12/14/2018 1343   HCT 34.1 (L) 12/14/2018 1343   PLT 292 12/14/2018 1343   MCV 95.8 12/14/2018 1343   MCH 30.3 12/14/2018 1343   MCHC 31.7 12/14/2018 1343   RDW 17.2 (H)  12/14/2018 1343   LYMPHSABS 1.3 12/14/2018 1343   MONOABS 0.8 12/14/2018 1343   EOSABS 0.1 12/14/2018 1343   BASOSABS 0.0 12/14/2018 1343   CMP Latest Ref Rng & Units 12/14/2018 11/30/2018 11/23/2018  Glucose 70 - 99 mg/dL 84 94 97  BUN 8 - 23 mg/dL 26(H) 25(H) 18  Creatinine 0.61 - 1.24 mg/dL 2.10(H) 1.47(H) 1.51(H)  Sodium 135 - 145 mmol/L 136 137 135  Potassium 3.5 - 5.1 mmol/L 5.0 5.0 4.4  Chloride 98 - 111 mmol/L 105 110 108  CO2 22 - 32 mmol/L 22 22 20(L)  Calcium 8.9 - 10.3 mg/dL 8.5(L) 7.9(L) 8.1(L)  Total Protein 6.5 - 8.1 g/dL 8.0 7.7 7.7  Total Bilirubin 0.3 - 1.2 mg/dL 0.4 0.3 0.4  Alkaline Phos 38 - 126 U/L 73 116 61  AST 15 - 41 U/L '23 17 22  ' ALT 0 - 44 U/L '21 17 16     ' I personally performed a face-to-face visit.  All questions were answered to patient's stated satisfaction. Encouraged patient to call with any new concerns or questions before his next visit to the cancer center and we can certain see him sooner, if needed.     ASSESSMENT & PLAN:   Malignant neoplasm of prostate ([place]) 1. 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 -Last bone scan on 07/25/2017 showed mixed response compared to prior scan on 09/06/2016. Last CT scan on 12/20/2016 did not show any visceral metastasis but showed pulmonary fibrotic changes. -He continues to tolerate Cabazitaxel very well. -He denies any GI side effects or neuropathy. -We will consider scans only if the PSA trends up. -Labs from 12/14/2018 showed potassium 5.0,  creatinine 2.10, WBC 9.4, hemoglobin 10.8, platelets 292. -PSA remains within normal range.  Labs are acceptable to proceed with cabazitaxel today. -We will give him an extra to 50 cc of normal saline since his creatinine has increased slightly. - He he will return to clinic within 3 weeks with labs and treatment.  2.  Bone metastasis: -We will  continue monthly denosumab. -Labs on 12/14/2018 showed his calcium level 8.5 -We will continue calcium and vitamin D daily.  3.  Chronic kidney disease: -This has been stable with a creatinine ranging between 1.6 and 2.0. -He has normocytic anemia with hemoglobin around 10.6. - Labs on 12/14/2018 showed his hemoglobin 10.8 and platelets 292.      Orders placed this encounter:  Orders Placed This Encounter  Procedures  . Lactate dehydrogenase  . CBC with Differential/Platelet  . Comprehensive metabolic panel  . Ferritin  . Iron and TIBC  . PSA  . Vitamin B12  . VITAMIN D 25 Hydroxy (Vit-D Deficiency, Fractures)      Francene Finders, FNP-C [place] [phone]

## 2018-12-15 NOTE — Patient Instructions (Signed)
[facility] Discharge Instructions for Patients Receiving Chemotherapy   Beginning January 23rd 2017 lab work for the [facility] will be done in the  Main lab at [facility] on 1st floor. If you have a lab appointment with the [place] please come in thru the  Main Entrance and check in at the main information desk   Today you received the following chemotherapy agents Jevtana infusion with Neulasta on-pro as well as Xgeva and Vit B12 injections. Follow-up as scheduled. Call clinic for any questions or concerns  To help prevent nausea and vomiting after your treatment, we encourage you to take your nausea medication   If you develop nausea and vomiting, or diarrhea that is not controlled by your medication, call the clinic.  The clinic phone number is [phone]. Office hours are Monday-Friday 8:30am-5:00pm.  BELOW ARE SYMPTOMS THAT SHOULD BE REPORTED IMMEDIATELY:  *FEVER GREATER THAN 101.0 F  *CHILLS WITH OR WITHOUT FEVER  NAUSEA AND VOMITING THAT IS NOT CONTROLLED WITH YOUR NAUSEA MEDICATION  *UNUSUAL SHORTNESS OF BREATH  *UNUSUAL BRUISING OR BLEEDING  TENDERNESS IN MOUTH AND THROAT WITH OR WITHOUT PRESENCE OF ULCERS  *URINARY PROBLEMS  *BOWEL PROBLEMS  UNUSUAL RASH Items with * indicate a potential emergency and should be followed up as soon as possible. If you have an emergency after office hours please contact your primary care physician or go to the nearest emergency department.  Please call the clinic during office hours if you have any questions or concerns.   You may also contact the Patient Navigator at [phone] should you have any questions or need assistance in obtaining follow up care.      Resources For Cancer Patients and their Caregivers ? American Cancer Society: Can assist with transportation, wigs, general needs, runs Look Good Feel Better.        ([phone] 538-1956 ? Cancer Care: Provides financial assistance, online  support groups, medication/co-pay assistance.  1-800-813-HOPE 769-034-4922) ? [facility] Assists [place] cancer patients and their families through emotional , educational and financial support.  820 815 5781 ? [place] DSS Where to apply for food stamps, Medicaid and utility assistance. 325-292-1977 ? [facility]: Transportation to medical appointments. 564-562-3663 ? Social Security Administration: May apply for disability if have a Stage IV cancer. 367-558-9594 (762)701-0338 ? [employer], Disability and Transit Services: Assists with nutrition, care and transit needs. [phone]

## 2019-01-04 ENCOUNTER — Inpatient Hospital Stay (HOSPITAL_COMMUNITY): Payer: Medicare Other | Attending: Hematology

## 2019-01-04 ENCOUNTER — Other Ambulatory Visit (HOSPITAL_COMMUNITY): Payer: Medicare Other

## 2019-01-04 ENCOUNTER — Other Ambulatory Visit: Payer: Self-pay

## 2019-01-04 DIAGNOSIS — C61 Malignant neoplasm of prostate: Secondary | ICD-10-CM | POA: Diagnosis present

## 2019-01-04 DIAGNOSIS — D649 Anemia, unspecified: Secondary | ICD-10-CM | POA: Insufficient documentation

## 2019-01-04 DIAGNOSIS — N189 Chronic kidney disease, unspecified: Secondary | ICD-10-CM | POA: Insufficient documentation

## 2019-01-04 DIAGNOSIS — Z5111 Encounter for antineoplastic chemotherapy: Secondary | ICD-10-CM | POA: Diagnosis not present

## 2019-01-04 DIAGNOSIS — C7951 Secondary malignant neoplasm of bone: Secondary | ICD-10-CM | POA: Diagnosis not present

## 2019-01-04 DIAGNOSIS — E559 Vitamin D deficiency, unspecified: Secondary | ICD-10-CM | POA: Diagnosis not present

## 2019-01-04 LAB — COMPREHENSIVE METABOLIC PANEL
ALT: 20 U/L (ref 0–44)
AST: 27 U/L (ref 15–41)
Albumin: 3.6 g/dL (ref 3.5–5.0)
Alkaline Phosphatase: 64 U/L (ref 38–126)
Anion gap: 9 (ref 5–15)
BUN: 23 mg/dL (ref 8–23)
CO2: 20 mmol/L — ABNORMAL LOW (ref 22–32)
Calcium: 9.3 mg/dL (ref 8.9–10.3)
Chloride: 105 mmol/L (ref 98–111)
Creatinine, Ser: 1.61 mg/dL — ABNORMAL HIGH (ref 0.61–1.24)
GFR calc Af Amer: 49 mL/min — ABNORMAL LOW (ref >60)
GFR calc non Af Amer: 42 mL/min — ABNORMAL LOW (ref >60)
Glucose, Bld: 96 mg/dL (ref 70–99)
Potassium: 4.7 mmol/L (ref 3.5–5.1)
Sodium: 134 mmol/L — ABNORMAL LOW (ref 135–145)
Total Bilirubin: 0.3 mg/dL (ref 0.3–1.2)
Total Protein: 7.1 g/dL (ref 6.5–8.1)

## 2019-01-04 LAB — CBC WITH DIFFERENTIAL/PLATELET
Abs Immature Granulocytes: 0.04 10*3/uL (ref 0.00–0.07)
Basophils Absolute: 0 10*3/uL (ref 0.0–0.1)
Basophils Relative: 0 %
Eosinophils Absolute: 0.1 10*3/uL (ref 0.0–0.5)
Eosinophils Relative: 1 %
HCT: 31.6 % — ABNORMAL LOW (ref 39.0–52.0)
Hemoglobin: 10.1 g/dL — ABNORMAL LOW (ref 13.0–17.0)
Immature Granulocytes: 0 %
Lymphocytes Relative: 14 %
Lymphs Abs: 1.4 10*3/uL (ref 0.7–4.0)
MCH: 31 pg (ref 26.0–34.0)
MCHC: 32 g/dL (ref 30.0–36.0)
MCV: 96.9 fL (ref 80.0–100.0)
Monocytes Absolute: 0.8 10*3/uL (ref 0.1–1.0)
Monocytes Relative: 8 %
Neutro Abs: 7.6 10*3/uL (ref 1.7–7.7)
Neutrophils Relative %: 77 %
Platelets: 322 10*3/uL (ref 150–400)
RBC: 3.26 MIL/uL — ABNORMAL LOW (ref 4.22–5.81)
RDW: 17.9 % — ABNORMAL HIGH (ref 11.5–15.5)
WBC: 9.9 10*3/uL (ref 4.0–10.5)
nRBC: 0 % (ref 0.0–0.2)

## 2019-01-04 LAB — PSA: Prostatic Specific Antigen: 0.79 ng/mL (ref 0.00–4.00)

## 2019-01-04 LAB — FERRITIN: Ferritin: 85 ng/mL (ref 24–336)

## 2019-01-04 LAB — IRON AND TIBC
Iron: 43 ug/dL — ABNORMAL LOW (ref 45–182)
Saturation Ratios: 19 % (ref 17.9–39.5)
TIBC: 222 ug/dL — ABNORMAL LOW (ref 250–450)
UIBC: 179 ug/dL

## 2019-01-04 LAB — VITAMIN B12: Vitamin B-12: 3214 pg/mL — ABNORMAL HIGH (ref 180–914)

## 2019-01-04 LAB — LACTATE DEHYDROGENASE: LDH: 149 U/L (ref 98–192)

## 2019-01-05 ENCOUNTER — Other Ambulatory Visit: Payer: Self-pay

## 2019-01-05 ENCOUNTER — Inpatient Hospital Stay (HOSPITAL_COMMUNITY): Payer: Medicare Other

## 2019-01-05 ENCOUNTER — Inpatient Hospital Stay (HOSPITAL_BASED_OUTPATIENT_CLINIC_OR_DEPARTMENT_OTHER): Payer: Medicare Other | Admitting: Nurse Practitioner

## 2019-01-05 ENCOUNTER — Encounter (HOSPITAL_COMMUNITY): Payer: Self-pay | Admitting: Nurse Practitioner

## 2019-01-05 VITALS — BP 121/76 | HR 65 | Temp 96.9°F | Resp 18

## 2019-01-05 DIAGNOSIS — C61 Malignant neoplasm of prostate: Secondary | ICD-10-CM

## 2019-01-05 DIAGNOSIS — D649 Anemia, unspecified: Secondary | ICD-10-CM | POA: Diagnosis not present

## 2019-01-05 DIAGNOSIS — N189 Chronic kidney disease, unspecified: Secondary | ICD-10-CM | POA: Diagnosis not present

## 2019-01-05 DIAGNOSIS — C7951 Secondary malignant neoplasm of bone: Secondary | ICD-10-CM | POA: Diagnosis not present

## 2019-01-05 DIAGNOSIS — E559 Vitamin D deficiency, unspecified: Secondary | ICD-10-CM | POA: Diagnosis not present

## 2019-01-05 DIAGNOSIS — Z5111 Encounter for antineoplastic chemotherapy: Secondary | ICD-10-CM | POA: Diagnosis not present

## 2019-01-05 LAB — VITAMIN D 25 HYDROXY (VIT D DEFICIENCY, FRACTURES): Vit D, 25-Hydroxy: 8.9 ng/mL — ABNORMAL LOW (ref 30.0–100.0)

## 2019-01-05 MED ORDER — SODIUM CHLORIDE 0.9 % IV SOLN
INTRAVENOUS | Status: DC
  Administered 2019-01-05: 10:00:00 via INTRAVENOUS

## 2019-01-05 MED ORDER — SODIUM CHLORIDE 0.9 % IV SOLN
20.0000 mg/m2 | Freq: Once | INTRAVENOUS | Status: AC
  Administered 2019-01-05: 11:00:00 41 mg via INTRAVENOUS
  Filled 2019-01-05: qty 4.1

## 2019-01-05 MED ORDER — HEPARIN SOD (PORK) LOCK FLUSH 100 UNIT/ML IV SOLN
500.0000 [IU] | Freq: Once | INTRAVENOUS | Status: AC | PRN
  Administered 2019-01-05: 500 [IU]

## 2019-01-05 MED ORDER — SODIUM CHLORIDE 0.9 % IV SOLN
10.0000 mg | Freq: Once | INTRAVENOUS | Status: DC
  Filled 2019-01-05: qty 1

## 2019-01-05 MED ORDER — SODIUM CHLORIDE 0.9 % IV SOLN
10.0000 mg | Freq: Once | INTRAVENOUS | Status: AC
  Administered 2019-01-05: 10:00:00 10 mg via INTRAVENOUS
  Filled 2019-01-05: qty 10

## 2019-01-05 MED ORDER — SODIUM CHLORIDE 0.9% FLUSH
10.0000 mL | INTRAVENOUS | Status: DC | PRN
  Administered 2019-01-05: 09:00:00 10 mL
  Filled 2019-01-05: qty 10

## 2019-01-05 MED ORDER — SODIUM CHLORIDE 0.9 % IV SOLN
Freq: Once | INTRAVENOUS | Status: AC
  Administered 2019-01-05: 09:00:00 via INTRAVENOUS

## 2019-01-05 MED ORDER — PEGFILGRASTIM 6 MG/0.6ML ~~LOC~~ PSKT
6.0000 mg | PREFILLED_SYRINGE | Freq: Once | SUBCUTANEOUS | Status: AC
  Administered 2019-01-05: 6 mg via SUBCUTANEOUS
  Filled 2019-01-05: qty 0.6

## 2019-01-05 MED ORDER — FAMOTIDINE IN NACL 20-0.9 MG/50ML-% IV SOLN
20.0000 mg | Freq: Once | INTRAVENOUS | Status: AC
  Administered 2019-01-05: 20 mg via INTRAVENOUS
  Filled 2019-01-05: qty 50

## 2019-01-05 MED ORDER — DIPHENHYDRAMINE HCL 50 MG/ML IJ SOLN
25.0000 mg | Freq: Once | INTRAMUSCULAR | Status: AC
  Administered 2019-01-05: 25 mg via INTRAVENOUS
  Filled 2019-01-05: qty 1

## 2019-01-05 NOTE — Assessment & Plan Note (Signed)
1. 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 -Last bone scan on 07/25/2017 showed mixed response compared to prior scan on 09/06/2016. Last CT scan on 12/20/2016 did not show any visceral metastasis but showed pulmonary fibrotic changes. -He continues to tolerate Cabazitaxel very well. -He denies any GI side effects or neuropathy. -We will consider scans only if the PSA trends up. -Labs from 01/04/2019 showed potassium 4.7, creatinine 1.61, WBC 9.9, hemoglobin 10.1, platelets 322. -PSA remains within normal range at .79.  Labs are acceptable to proceed with cabazitaxel today. -We will give him an extra to 500 cc of normal saline today. His creatinine is improved from last week. - He he will return to clinic within 3 weeks with labs and treatment.  2.  Bone metastasis: -We will continue monthly denosumab. -Labs on 01/04/2019 showed his calcium level 9.3 -We will continue calcium and vitamin D daily.  3.  Chronic kidney disease: -This has been stable with a creatinine ranging between 1.6 and 2.0. -He has normocytic anemia with hemoglobin around 10.6. - Labs on 01/04/2019 showed his hemoglobin 10.1 and platelets 322.  4.  Vitamin D deficiency: - Labs on 01/04/2019 showed his vitamin D level 8.9. -I will call in a prescription for vitamin D 50,000 units weekly. -We will recheck labs in 3 weeks.

## 2019-01-05 NOTE — Progress Notes (Signed)
Patient seen today by Francene Finders, NP, with verbal order ok to treat today.  Verbal order for NACL 582ml with treatment per Francene Finders, NP.    Patient tolerated chemotherapy with no complaints voiced.  Port site clean and dry with no bruising or swelling noted at site.  Good blood return noted before and after administration of chemotherapy.  Band aid applied.  Neulasta Onpro applied to arm with green indicator light flashing.  Patient left ambulatory with VSS and no s/s of distress noted.

## 2019-01-05 NOTE — Progress Notes (Signed)
Jeff [name], [place] [address]   CLINIC:  Medical Oncology/Hematology  PCP:  Jeff Bouche, NP (Inactive) No address on file None   REASON FOR VISIT: Follow-up for prostate cancer  CURRENT THERAPY:  Cabazitaxel every 3 weeks   BRIEF ONCOLOGIC HISTORY:  Oncology History  Malignant neoplasm of prostate ([place])  01/26/2014 Tumor Marker   PSA > 5000   01/28/2014 Initial Biopsy   Metastatic adenocarcinoma of prostate   02/05/2014 Surgery   Bilateral orchiectomy by Jeff [name]   02/26/2014 Tumor Marker   PSA = 399.5   03/14/2014 Tumor Marker   PSA= 89.51   03/14/2014 - 06/26/2014 Chemotherapy   Docetaxel 75 mg/kg every 21 days x 6 cycles   04/24/2014 Tumor Marker   PSA- 19.89   07/23/2014 Procedure   Nephrostomy tube removed by IR, Dr. Geroge [name]   07/24/2014 Tumor Marker   PSA= 6.15   10/16/2014 Tumor Marker   PSA: 3.54    01/08/2015 Tumor Marker   PSA: 2.13    01/17/2015 Imaging   CT CAP-  Massive pelvic and retroperitoneal lymphadenopathy noted on the prior study has nearly completely resolved, now with only a small amount of residual amorphous soft tissue predominantly around the the infrarenal abdominal aorta.   01/17/2015 Imaging   Bone scan- Widespread osseous metastatic disease with multiple foci of increased activity throughout the skeleton, corresponding with the findings on the prior CT from October, 2015.   04/11/2015 Tumor Marker   PSA: 1.87    07/21/2015 Imaging   Bone scan- Bony metastatic disease again noted at multiple sites, stable from prior study. No progression of bony metastatic disease is demonstrable on this study.   07/25/2015 Imaging   CT CAP- Stable matted soft tissue density in the retroperitoneum and extraperitoneal pelvis consistent with treated disease. No recurrent lymphadenopathy.  No pulmonary metastatic disease. Diffuse stable sclerotic metastatic bone disease.   01/14/2016 Imaging   Bone  scan-  Multiple sites of abnormal increased tracer localization involving BILATERAL ribs, T11, T12, questionably RIGHT scapula and LEFT iliac bone suspicious for osseous metastases.   01/23/2016 Imaging   CT CAP- 1. Similar widespread osseous metastasis. 2. Similar soft tissue thickening within the retroperitoneum of the abdomen. Improved left pelvic side wall soft tissue thickening. These are consistent with sites of treated disease. No well-defined adenopathy. 3. No new sites of disease.   05/03/2016 -  Chemotherapy   Jevtana every 21 days    05/03/2016 -  Chemotherapy   The patient had pegfilgrastim (NEULASTA) injection 6 mg, 6 mg, Subcutaneous,  Once, 1 of 1 cycle Administration: 6 mg (05/05/2016) pegfilgrastim (NEULASTA ONPRO KIT) injection 6 mg, 6 mg, Subcutaneous, Once, 40 of 44 cycles Administration: 6 mg (05/24/2016), 6 mg (06/14/2016), 6 mg (07/07/2016), 6 mg (07/28/2016), 6 mg (08/18/2016), 6 mg (09/08/2016), 6 mg (09/29/2016), 6 mg (10/20/2016), 6 mg (11/10/2016), 6 mg (02/14/2017), 6 mg (03/07/2017), 6 mg (03/28/2017), 6 mg (04/20/2017), 6 mg (05/11/2017), 6 mg (06/27/2017), 6 mg (08/30/2017), 6 mg (09/20/2017), 6 mg (10/11/2017), 6 mg (11/02/2017), 6 mg (08/09/2017), 6 mg (11/23/2017), 6 mg (12/14/2017), 6 mg (01/09/2018), 6 mg (01/31/2018), 6 mg (04/04/2018), 6 mg (04/27/2018), 6 mg (05/18/2018), 6 mg (06/08/2018), 6 mg (06/29/2018), 6 mg (07/20/2018), 6 mg (08/10/2018), 6 mg (09/01/2018), 6 mg (09/22/2018), 6 mg (10/13/2018), 6 mg (11/03/2018), 6 mg (11/24/2018), 6 mg (12/15/2018) cabazitaxel (JEVTANA) 41 mg in dextrose 5 % 250 mL chemo infusion, 20 mg/m2 = 41 mg (100 %  of original dose 20 mg/m2), Intravenous,  Once, 41 of 45 cycles Dose modification: 20 mg/m2 (original dose 20 mg/m2, Cycle 1, Reason: Dose not tolerated, Comment: recommended by Data to use 43m/m), 20 mg/m2 (original dose 20 mg/m2, Cycle 37, Reason: Other (see comments), Comment: Changed to new orderable), 21 mg/m2 (original dose 20 mg/m2, Cycle 40, Reason:  Other (see comments)), 20 mg/m2 (original dose 20 mg/m2, Cycle 40, Reason: Other (see comments)) Administration: 41 mg (05/03/2016), 41 mg (05/24/2016), 41 mg (06/14/2016), 41 mg (07/07/2016), 41 mg (07/28/2016), 41 mg (08/18/2016), 41 mg (09/08/2016), 41 mg (09/29/2016), 41 mg (10/20/2016), 41 mg (11/10/2016), 41 mg (02/14/2017), 41 mg (03/07/2017), 41 mg (03/28/2017), 41 mg (04/20/2017), 41 mg (05/11/2017), 41 mg (06/27/2017), 41 mg (08/30/2017), 41 mg (09/20/2017), 41 mg (10/11/2017), 41 mg (11/02/2017), 41 mg (08/09/2017), 41 mg (11/23/2017), 41 mg (12/14/2017), 41 mg (01/09/2018), 41 mg (01/31/2018), 41 mg (02/21/2018), 41 mg (04/04/2018), 41 mg (04/27/2018), 41 mg (05/18/2018), 41 mg (06/08/2018), 41 mg (06/29/2018), 41 mg (07/20/2018), 41 mg (08/10/2018), 41 mg (09/01/2018), 41 mg (09/22/2018), 41 mg (10/13/2018), 41 mg (11/03/2018), 41 mg (11/24/2018), 41 mg (12/15/2018)  for chemotherapy treatment.    09/06/2016 Imaging   Restaging CT C/A/P: IMPRESSION: 1. Similar appearance of widespread sclerotic bone metastases. 2. No change in soft tissue thickening within the retroperitoneum and left pelvic sidewall. No well defined adenopathy identified. 3. No new sites of disease 4. Aortic Atherosclerosis (ICD10-I70.0). Coronary artery calcifications noted. 5. 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 6. Stable 9 mm pancreatic cystic lesion. Favor pseudocyst. Indolent neoplasm may look similar. Attention on follow-up imaging.   09/06/2016 Imaging   Bone Scan: IMPRESSION: In this patient with known diffuse sclerotic metastatic disease, bone scan does not reveal progression of radiotracer uptake. Several of the areas of previously demonstrated radiotracer uptake appear less prominent.  Change in appearance of radiotracer uptake involving the mandible now more notable on the right and previously more notable on the left may reflect result of dental disease rather than metastatic  disease.    04/08/2017 Tumor Marker   PSA 0.89     INTERVAL HISTORY:  [name]89y.o. male returns for routine follow-up for prostate cancer. Patient was reports he is feeling great since his last treatment. He has no complaints at this time. He is eating well and drinking plenty of fluids. He denies any new bone pain. Denies any nausea, vomiting, or diarrhea. Denies any new pains. Had not noticed any recent bleeding such as epistaxis, hematuria or hematochezia. Denies recent chest pain on exertion, shortness of breath on minimal exertion, pre-syncopal episodes, or palpitations. Denies any numbness or tingling in hands or feet. Denies any recent fevers, infections, or recent hospitalizations. Patient reports appetite at 100% and energy level at 75%. He is maintaining his weight at this time.     REVIEW OF SYSTEMS:  Review of Systems  All other systems reviewed and are negative.    PAST MEDICAL/SURGICAL HISTORY:  Past Medical History:  Diagnosis Date  . Acute renal failure ([facility] 01/26/2014  . Alcohol abuse   . Anemia of chronic disease 01/26/2014  . Bilateral hydronephrosis 01/26/2014  . History of cocaine abuse ([facility] 01/26/2014  . Homelessness 01/31/2014  . Prostate cancer metastatic to multiple sites ([facility] 01/30/2014  . Sepsis ([facility]   . UTI (lower urinary tract infection)    Past Surgical History:  Procedure Laterality Date  . ORCHIECTOMY Bilateral 02/05/2014   Procedure: BILATERAL ORCHIECTOMY;  Surgeon: MRodman Key  Jeff Silk, MD;  Location: [facility];  Service: Urology;  Laterality: Bilateral;  . PERCUTANEOUS NEPHROSTOMY Bilateral    IR Jeff [name] changed on 04/22/2014  . PORT-A-CATH REMOVAL Right 08/08/2017   Procedure: REMOVAL PORT-A-CATH RIGHT CHEST (PROCEDURE #2);  Surgeon: Aviva Signs, MD;  Location: [facility];  Service: General;  Laterality: Right;  . PORTACATH PLACEMENT Right 03/12/14  . PORTACATH PLACEMENT Left 08/08/2017   Procedure: INSERTION POWER PORT WITH ATTACHED  CATHETER LEFT SUBCLAVIAN (PROCEDURE #1);  Surgeon: Aviva Signs, MD;  Location: [facility];  Service: General;  Laterality: Left;     SOCIAL HISTORY:  Social History   Socioeconomic History  . Marital status: Single    Spouse name: Not on file  . Number of children: 1  . Years of education: 9th  . Highest education level: Not on file  Occupational History  . Occupation: disabilty    Employer: [employer]  . Financial resource strain: Not on file  . Food insecurity    Worry: Not on file    Inability: Not on file  . Transportation needs    Medical: Not on file    Non-medical: Not on file  Tobacco Use  . Smoking status: Current Every Day Smoker    Packs/day: 0.25    Years: 55.00    Pack years: 13.75    Types: Cigarettes  . Smokeless tobacco: Never Used  Substance and Sexual Activity  . Alcohol use: Yes    Alcohol/week: 1.0 standard drinks    Types: 1 Cans of beer per week  . Drug use: Yes    Types: Cocaine    Comment: last used Cocaine 01/25/14  . Sexual activity: Yes  Lifestyle  . Physical activity    Days per week: Not on file    Minutes per session: Not on file  . Stress: Not on file  Relationships  . Social Herbalist on phone: Not on file    Gets together: Not on file    Attends religious service: Not on file    Active member of club or organization: Not on file    Attends meetings of clubs or organizations: Not on file    Relationship status: Not on file  . Intimate partner violence    Fear of current or ex partner: Not on file    Emotionally abused: Not on file    Physically abused: Not on file    Forced sexual activity: Not on file  Other Topics Concern  . Not on file  Social History Narrative   Lives at [employer]   1 son, lives in [place], [place]   Can read and write in native language             FAMILY HISTORY:  Family History  Problem Relation Age of Onset  . Cancer Brother 25     CURRENT MEDICATIONS:  Outpatient Encounter Medications as of 01/05/2019  Medication Sig Note  . calcium gluconate 500 MG tablet Take 1 tablet by mouth 2 (two) times daily.   . prochlorperazine (COMPAZINE) 10 MG tablet The day after chemo take 1 tab four times a day x 2 days. Then may take 1 tab four times a day if needed for nausea/vomiting. (Patient not taking: Reported on 11/24/2018) 07/24/2014: Just takes as needed   Facility-Administered Encounter Medications as of 01/05/2019  Medication  . cyanocobalamin ((VITAMIN B-12)) injection 1,000 mcg  . denosumab (XGEVA) injection 120  mg    ALLERGIES:  No Known Allergies   PHYSICAL EXAM:  ECOG Performance status: 1  Vitals:   01/05/19 0815  BP: 98/61  Pulse: (!) 101  Resp: 18  Temp: 97.6 F (36.4 C)  SpO2: 100%   Filed Weights   01/05/19 0815  Weight: 166 lb 8 oz (75.5 kg)    Physical Exam Constitutional:      Appearance: Normal appearance. He is normal weight.  Cardiovascular:     Rate and Rhythm: Normal rate and regular rhythm.     Heart sounds: Normal heart sounds.  Pulmonary:     Effort: Pulmonary effort is normal.     Breath sounds: Normal breath sounds.  Abdominal:     General: Bowel sounds are normal.     Palpations: Abdomen is soft.  Musculoskeletal: Normal range of motion.  Skin:    General: Skin is warm and dry.  Neurological:     Mental Status: He is alert and oriented to person, place, and time. Mental status is at baseline.  Psychiatric:        Mood and Affect: Mood normal.        Behavior: Behavior normal.        Thought Content: Thought content normal.        Judgment: Judgment normal.      LABORATORY DATA:  I have reviewed the labs as listed.  CBC    Component Value Date/Time   WBC 9.9 01/04/2019 1356   RBC 3.26 (L) 01/04/2019 1356   HGB 10.1 (L) 01/04/2019 1356   HCT 31.6 (L) 01/04/2019 1356   PLT 322 01/04/2019 1356   MCV 96.9 01/04/2019 1356   MCH 31.0 01/04/2019 1356   MCHC 32.0  01/04/2019 1356   RDW 17.9 (H) 01/04/2019 1356   LYMPHSABS 1.4 01/04/2019 1356   MONOABS 0.8 01/04/2019 1356   EOSABS 0.1 01/04/2019 1356   BASOSABS 0.0 01/04/2019 1356   CMP Latest Ref Rng & Units 01/04/2019 12/14/2018 11/30/2018  Glucose 70 - 99 mg/dL 96 84 94  BUN 8 - 23 mg/dL 23 26(H) 25(H)  Creatinine 0.61 - 1.24 mg/dL 1.61(H) 2.10(H) 1.47(H)  Sodium 135 - 145 mmol/L 134(L) 136 137  Potassium 3.5 - 5.1 mmol/L 4.7 5.0 5.0  Chloride 98 - 111 mmol/L 105 105 110  CO2 22 - 32 mmol/L 20(L) 22 22  Calcium 8.9 - 10.3 mg/dL 9.3 8.5(L) 7.9(L)  Total Protein 6.5 - 8.1 g/dL 7.1 8.0 7.7  Total Bilirubin 0.3 - 1.2 mg/dL 0.3 0.4 0.3  Alkaline Phos 38 - 126 U/L 64 73 116  AST 15 - 41 U/L '27 23 17  ' ALT 0 - 44 U/L '20 21 17     ' I personally performed a face-to-face visit.  All questions were answered to patient's stated satisfaction. Encouraged patient to call with any new concerns or questions before his next visit to the cancer center and we can certain see him sooner, if needed.     ASSESSMENT & PLAN:   Malignant neoplasm of prostate ([place]) 1. 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 -Last bone scan on 07/25/2017 showed mixed response compared to prior scan on 09/06/2016. Last CT scan on 12/20/2016 did not show any visceral metastasis but showed pulmonary fibrotic changes. -He continues to tolerate Cabazitaxel very well. -He denies any GI side effects or neuropathy. -We will consider scans only if the PSA trends up. -  Labs from 01/04/2019 showed potassium 4.7, creatinine 1.61, WBC 9.9, hemoglobin 10.1, platelets 322. -PSA remains within normal range at .79.  Labs are acceptable to proceed with cabazitaxel today. -We will give him an extra to 500 cc of normal saline today. His creatinine is improved from last week. - He he will return to clinic within 3 weeks with labs and  treatment.  2.  Bone metastasis: -We will continue monthly denosumab. -Labs on 01/04/2019 showed his calcium level 9.3 -We will continue calcium and vitamin D daily.  3.  Chronic kidney disease: -This has been stable with a creatinine ranging between 1.6 and 2.0. -He has normocytic anemia with hemoglobin around 10.6. - Labs on 01/04/2019 showed his hemoglobin 10.1 and platelets 322.  4.  Vitamin D deficiency: - Labs on 01/04/2019 showed his vitamin D level 8.9. -I will call in a prescription for vitamin D 50,000 units weekly. -We will recheck labs in 3 weeks.      Orders placed this encounter:  Orders Placed This Encounter  Procedures  . Lactate dehydrogenase  . Magnesium  . CBC with Differential/Platelet  . Comprehensive metabolic panel  . Vitamin B12  . VITAMIN D 25 Hydroxy (Vit-D Deficiency, Fractures)  . PSA      Francene Finders, FNP-C [place] [phone]

## 2019-01-05 NOTE — Patient Instructions (Signed)
[facility] at [facility] Discharge Instructions     Thank you for choosing [facility] at [facility] to provide your oncology and hematology care.  To afford each patient quality time with our provider, please arrive at least 15 minutes before your scheduled appointment time.   If you have a lab appointment with the Cancer Center please come in thru the Main Entrance and check in at the main information desk.  You need to re-schedule your appointment should you arrive 10 or more minutes late.  We strive to give you quality time with our providers, and arriving late affects you and other patients whose appointments are after yours.  Also, if you no show three or more times for appointments you may be dismissed from the clinic at the providers discretion.     Again, thank you for choosing [facility].  Our hope is that these requests will decrease the amount of time that you wait before being seen by our physicians.       _____________________________________________________________  Should you have questions after your visit to [facility], please contact our office at [phone] between the hours of 8:00 a.m. and 4:30 p.m.  Voicemails left after 4:00 p.m. will not be returned until the following business day.  For prescription refill requests, have your pharmacy contact our office and allow 72 hours.    Due to Covid, you will need to wear a mask upon entering the hospital. If you do not have a mask, a mask will be given to you at the Main Entrance upon arrival. For doctor visits, patients may have 1 support person with them. For treatment visits, patients can not have anyone with them due to social distancing guidelines and our immunocompromised population.      

## 2019-01-12 ENCOUNTER — Other Ambulatory Visit: Payer: Self-pay

## 2019-01-12 ENCOUNTER — Encounter (HOSPITAL_COMMUNITY): Payer: Self-pay

## 2019-01-12 ENCOUNTER — Inpatient Hospital Stay (HOSPITAL_COMMUNITY): Payer: Medicare Other

## 2019-01-12 VITALS — BP 96/52 | HR 93 | Temp 96.9°F | Resp 18

## 2019-01-12 DIAGNOSIS — C61 Malignant neoplasm of prostate: Secondary | ICD-10-CM

## 2019-01-12 DIAGNOSIS — E538 Deficiency of other specified B group vitamins: Secondary | ICD-10-CM

## 2019-01-12 DIAGNOSIS — N189 Chronic kidney disease, unspecified: Secondary | ICD-10-CM | POA: Diagnosis not present

## 2019-01-12 DIAGNOSIS — M899 Disorder of bone, unspecified: Secondary | ICD-10-CM

## 2019-01-12 DIAGNOSIS — Z5111 Encounter for antineoplastic chemotherapy: Secondary | ICD-10-CM | POA: Diagnosis not present

## 2019-01-12 DIAGNOSIS — E559 Vitamin D deficiency, unspecified: Secondary | ICD-10-CM | POA: Diagnosis not present

## 2019-01-12 DIAGNOSIS — C7951 Secondary malignant neoplasm of bone: Secondary | ICD-10-CM | POA: Diagnosis not present

## 2019-01-12 DIAGNOSIS — D649 Anemia, unspecified: Secondary | ICD-10-CM | POA: Diagnosis not present

## 2019-01-12 LAB — COMPREHENSIVE METABOLIC PANEL
ALT: 17 U/L (ref 0–44)
AST: 18 U/L (ref 15–41)
Albumin: 3.9 g/dL (ref 3.5–5.0)
Alkaline Phosphatase: 122 U/L (ref 38–126)
Anion gap: 4 — ABNORMAL LOW (ref 5–15)
BUN: 20 mg/dL (ref 8–23)
CO2: 27 mmol/L (ref 22–32)
Calcium: 8.7 mg/dL — ABNORMAL LOW (ref 8.9–10.3)
Chloride: 106 mmol/L (ref 98–111)
Creatinine, Ser: 1.56 mg/dL — ABNORMAL HIGH (ref 0.61–1.24)
GFR calc Af Amer: 51 mL/min — ABNORMAL LOW (ref >60)
GFR calc non Af Amer: 44 mL/min — ABNORMAL LOW (ref >60)
Glucose, Bld: 97 mg/dL (ref 70–99)
Potassium: 4.8 mmol/L (ref 3.5–5.1)
Sodium: 137 mmol/L (ref 135–145)
Total Bilirubin: 0.3 mg/dL (ref 0.3–1.2)
Total Protein: 7.6 g/dL (ref 6.5–8.1)

## 2019-01-12 LAB — CBC WITH DIFFERENTIAL/PLATELET
Abs Immature Granulocytes: 0.41 10*3/uL — ABNORMAL HIGH (ref 0.00–0.07)
Basophils Absolute: 0.1 10*3/uL (ref 0.0–0.1)
Basophils Relative: 1 %
Eosinophils Absolute: 0.1 10*3/uL (ref 0.0–0.5)
Eosinophils Relative: 0 %
HCT: 32.4 % — ABNORMAL LOW (ref 39.0–52.0)
Hemoglobin: 10.2 g/dL — ABNORMAL LOW (ref 13.0–17.0)
Immature Granulocytes: 2 %
Lymphocytes Relative: 7 %
Lymphs Abs: 1.7 10*3/uL (ref 0.7–4.0)
MCH: 30.4 pg (ref 26.0–34.0)
MCHC: 31.5 g/dL (ref 30.0–36.0)
MCV: 96.7 fL (ref 80.0–100.0)
Monocytes Absolute: 0.9 10*3/uL (ref 0.1–1.0)
Monocytes Relative: 4 %
Neutro Abs: 20.9 10*3/uL — ABNORMAL HIGH (ref 1.7–7.7)
Neutrophils Relative %: 86 %
Platelets: 324 10*3/uL (ref 150–400)
RBC: 3.35 MIL/uL — ABNORMAL LOW (ref 4.22–5.81)
RDW: 17.5 % — ABNORMAL HIGH (ref 11.5–15.5)
WBC: 24.2 10*3/uL — ABNORMAL HIGH (ref 4.0–10.5)
nRBC: 0 % (ref 0.0–0.2)

## 2019-01-12 MED ORDER — DENOSUMAB 120 MG/1.7ML ~~LOC~~ SOLN
SUBCUTANEOUS | Status: AC
  Filled 2019-01-12: qty 1.7

## 2019-01-12 MED ORDER — PEGFILGRASTIM-CBQV 6 MG/0.6ML ~~LOC~~ SOSY
PREFILLED_SYRINGE | SUBCUTANEOUS | Status: AC
  Filled 2019-01-12: qty 0.6

## 2019-01-12 MED ORDER — CYANOCOBALAMIN 1000 MCG/ML IJ SOLN
INTRAMUSCULAR | Status: AC
  Filled 2019-01-12: qty 1

## 2019-01-12 MED ORDER — CYANOCOBALAMIN 1000 MCG/ML IJ SOLN
1000.0000 ug | Freq: Once | INTRAMUSCULAR | Status: AC
  Administered 2019-01-12: 1000 ug via INTRAMUSCULAR

## 2019-01-12 MED ORDER — DENOSUMAB 120 MG/1.7ML ~~LOC~~ SOLN
120.0000 mg | Freq: Once | SUBCUTANEOUS | Status: AC
  Administered 2019-01-12: 120 mg via SUBCUTANEOUS

## 2019-01-12 NOTE — Progress Notes (Signed)
Notified K. Nester, NP of calcium of 8.7.  Okay to proceed with Delton See today per NP.

## 2019-01-25 ENCOUNTER — Inpatient Hospital Stay (HOSPITAL_COMMUNITY): Payer: Medicare Other | Attending: Hematology

## 2019-01-25 ENCOUNTER — Ambulatory Visit (HOSPITAL_COMMUNITY): Payer: Medicare Other | Admitting: Hematology

## 2019-01-25 ENCOUNTER — Other Ambulatory Visit: Payer: Self-pay

## 2019-01-25 DIAGNOSIS — Z5189 Encounter for other specified aftercare: Secondary | ICD-10-CM | POA: Diagnosis not present

## 2019-01-25 DIAGNOSIS — Z23 Encounter for immunization: Secondary | ICD-10-CM | POA: Insufficient documentation

## 2019-01-25 DIAGNOSIS — D649 Anemia, unspecified: Secondary | ICD-10-CM | POA: Diagnosis not present

## 2019-01-25 DIAGNOSIS — C61 Malignant neoplasm of prostate: Secondary | ICD-10-CM

## 2019-01-25 DIAGNOSIS — E559 Vitamin D deficiency, unspecified: Secondary | ICD-10-CM | POA: Diagnosis not present

## 2019-01-25 DIAGNOSIS — C7951 Secondary malignant neoplasm of bone: Secondary | ICD-10-CM | POA: Diagnosis not present

## 2019-01-25 DIAGNOSIS — Z5111 Encounter for antineoplastic chemotherapy: Secondary | ICD-10-CM | POA: Insufficient documentation

## 2019-01-25 LAB — COMPREHENSIVE METABOLIC PANEL
ALT: 17 U/L (ref 0–44)
AST: 20 U/L (ref 15–41)
Albumin: 3.9 g/dL (ref 3.5–5.0)
Alkaline Phosphatase: 67 U/L (ref 38–126)
Anion gap: 8 (ref 5–15)
BUN: 25 mg/dL — ABNORMAL HIGH (ref 8–23)
CO2: 18 mmol/L — ABNORMAL LOW (ref 22–32)
Calcium: 8 mg/dL — ABNORMAL LOW (ref 8.9–10.3)
Chloride: 110 mmol/L (ref 98–111)
Creatinine, Ser: 1.64 mg/dL — ABNORMAL HIGH (ref 0.61–1.24)
GFR calc Af Amer: 48 mL/min — ABNORMAL LOW (ref >60)
GFR calc non Af Amer: 41 mL/min — ABNORMAL LOW (ref >60)
Glucose, Bld: 96 mg/dL (ref 70–99)
Potassium: 4.6 mmol/L (ref 3.5–5.1)
Sodium: 136 mmol/L (ref 135–145)
Total Bilirubin: 0.5 mg/dL (ref 0.3–1.2)
Total Protein: 7.8 g/dL (ref 6.5–8.1)

## 2019-01-25 LAB — VITAMIN D 25 HYDROXY (VIT D DEFICIENCY, FRACTURES): Vit D, 25-Hydroxy: 17.5 ng/mL — ABNORMAL LOW (ref 30–100)

## 2019-01-25 LAB — CBC WITH DIFFERENTIAL/PLATELET
Abs Immature Granulocytes: 0.06 10*3/uL (ref 0.00–0.07)
Basophils Absolute: 0 10*3/uL (ref 0.0–0.1)
Basophils Relative: 0 %
Eosinophils Absolute: 0 10*3/uL (ref 0.0–0.5)
Eosinophils Relative: 0 %
HCT: 36.4 % — ABNORMAL LOW (ref 39.0–52.0)
Hemoglobin: 11 g/dL — ABNORMAL LOW (ref 13.0–17.0)
Immature Granulocytes: 1 %
Lymphocytes Relative: 22 %
Lymphs Abs: 1.5 10*3/uL (ref 0.7–4.0)
MCH: 30.5 pg (ref 26.0–34.0)
MCHC: 30.2 g/dL (ref 30.0–36.0)
MCV: 100.8 fL — ABNORMAL HIGH (ref 80.0–100.0)
Monocytes Absolute: 0.5 10*3/uL (ref 0.1–1.0)
Monocytes Relative: 8 %
Neutro Abs: 4.6 10*3/uL (ref 1.7–7.7)
Neutrophils Relative %: 69 %
Platelets: 268 10*3/uL (ref 150–400)
RBC: 3.61 MIL/uL — ABNORMAL LOW (ref 4.22–5.81)
RDW: 17.9 % — ABNORMAL HIGH (ref 11.5–15.5)
WBC: 6.7 10*3/uL (ref 4.0–10.5)
nRBC: 0 % (ref 0.0–0.2)

## 2019-01-25 LAB — VITAMIN B12: Vitamin B-12: 3145 pg/mL — ABNORMAL HIGH (ref 180–914)

## 2019-01-25 LAB — MAGNESIUM: Magnesium: 2.2 mg/dL (ref 1.7–2.4)

## 2019-01-25 LAB — PSA: Prostatic Specific Antigen: 0.9 ng/mL (ref 0.00–4.00)

## 2019-01-25 LAB — LACTATE DEHYDROGENASE: LDH: 177 U/L (ref 98–192)

## 2019-01-26 ENCOUNTER — Other Ambulatory Visit (HOSPITAL_COMMUNITY): Payer: Medicare Other

## 2019-01-26 ENCOUNTER — Inpatient Hospital Stay (HOSPITAL_COMMUNITY): Payer: Medicare Other

## 2019-01-26 ENCOUNTER — Other Ambulatory Visit: Payer: Self-pay

## 2019-01-26 ENCOUNTER — Ambulatory Visit (HOSPITAL_COMMUNITY): Payer: Medicare Other | Admitting: Hematology

## 2019-01-26 VITALS — BP 118/78 | HR 74 | Temp 97.6°F | Resp 18

## 2019-01-26 DIAGNOSIS — C61 Malignant neoplasm of prostate: Secondary | ICD-10-CM

## 2019-01-26 DIAGNOSIS — C7951 Secondary malignant neoplasm of bone: Secondary | ICD-10-CM | POA: Diagnosis not present

## 2019-01-26 DIAGNOSIS — D649 Anemia, unspecified: Secondary | ICD-10-CM | POA: Diagnosis not present

## 2019-01-26 DIAGNOSIS — Z5111 Encounter for antineoplastic chemotherapy: Secondary | ICD-10-CM | POA: Diagnosis not present

## 2019-01-26 DIAGNOSIS — Z5189 Encounter for other specified aftercare: Secondary | ICD-10-CM | POA: Diagnosis not present

## 2019-01-26 DIAGNOSIS — Z23 Encounter for immunization: Secondary | ICD-10-CM | POA: Diagnosis not present

## 2019-01-26 DIAGNOSIS — E559 Vitamin D deficiency, unspecified: Secondary | ICD-10-CM | POA: Diagnosis not present

## 2019-01-26 MED ORDER — FAMOTIDINE IN NACL 20-0.9 MG/50ML-% IV SOLN
20.0000 mg | Freq: Once | INTRAVENOUS | Status: AC
  Administered 2019-01-26: 20 mg via INTRAVENOUS
  Filled 2019-01-26: qty 50

## 2019-01-26 MED ORDER — SODIUM CHLORIDE 0.9% FLUSH
10.0000 mL | INTRAVENOUS | Status: DC | PRN
  Administered 2019-01-26: 10 mL
  Filled 2019-01-26: qty 10

## 2019-01-26 MED ORDER — SODIUM CHLORIDE 0.9 % IV SOLN
10.0000 mg | Freq: Once | INTRAVENOUS | Status: AC
  Administered 2019-01-26: 10 mg via INTRAVENOUS
  Filled 2019-01-26: qty 10

## 2019-01-26 MED ORDER — SODIUM CHLORIDE 0.9 % IV SOLN
Freq: Once | INTRAVENOUS | Status: AC
  Administered 2019-01-26: 09:00:00 via INTRAVENOUS

## 2019-01-26 MED ORDER — HEPARIN SOD (PORK) LOCK FLUSH 100 UNIT/ML IV SOLN
500.0000 [IU] | Freq: Once | INTRAVENOUS | Status: AC | PRN
  Administered 2019-01-26: 500 [IU]

## 2019-01-26 MED ORDER — SODIUM CHLORIDE 0.9 % IV SOLN
20.0000 mg/m2 | Freq: Once | INTRAVENOUS | Status: AC
  Administered 2019-01-26: 41 mg via INTRAVENOUS
  Filled 2019-01-26: qty 4.1

## 2019-01-26 MED ORDER — PEGFILGRASTIM 6 MG/0.6ML ~~LOC~~ PSKT
6.0000 mg | PREFILLED_SYRINGE | Freq: Once | SUBCUTANEOUS | Status: AC
  Administered 2019-01-26: 12:00:00 6 mg via SUBCUTANEOUS
  Filled 2019-01-26: qty 0.6

## 2019-01-26 MED ORDER — DIPHENHYDRAMINE HCL 50 MG/ML IJ SOLN
25.0000 mg | Freq: Once | INTRAMUSCULAR | Status: AC
  Administered 2019-01-26: 25 mg via INTRAVENOUS
  Filled 2019-01-26: qty 1

## 2019-01-26 NOTE — Progress Notes (Signed)
Pt presents today for Jevtana and On-Pro. Labs drawn 01/25/19. Creat 1.64. Reviewed labs with RNester NP. VO to proceed with tx obtained. Pt has no complaints of any changes since his last visit. MAR reviewed. VSS.   Treatment given today per MD orders. Tolerated infusion without adverse affects. Vital signs stable. No complaints at this time. Discharged from clinic ambulatory. F/U with [facility] as scheduled.

## 2019-01-26 NOTE — Patient Instructions (Signed)
[facility] Discharge Instructions for Patients Receiving Chemotherapy  Today you received the following chemotherapy agents   To help prevent nausea and vomiting after your treatment, we encourage you to take your nausea medication   If you develop nausea and vomiting that is not controlled by your nausea medication, call the clinic.   BELOW ARE SYMPTOMS THAT SHOULD BE REPORTED IMMEDIATELY:  *FEVER GREATER THAN 100.5 F  *CHILLS WITH OR WITHOUT FEVER  NAUSEA AND VOMITING THAT IS NOT CONTROLLED WITH YOUR NAUSEA MEDICATION  *UNUSUAL SHORTNESS OF BREATH  *UNUSUAL BRUISING OR BLEEDING  TENDERNESS IN MOUTH AND THROAT WITH OR WITHOUT PRESENCE OF ULCERS  *URINARY PROBLEMS  *BOWEL PROBLEMS  UNUSUAL RASH Items with * indicate a potential emergency and should be followed up as soon as possible.  Feel free to call the clinic should you have any questions or concerns. The clinic phone number is [phone].  Please show the CHEMO ALERT CARD at check-in to the Emergency Department and triage nurse.   

## 2019-02-15 ENCOUNTER — Other Ambulatory Visit (HOSPITAL_COMMUNITY): Payer: Self-pay

## 2019-02-15 DIAGNOSIS — E538 Deficiency of other specified B group vitamins: Secondary | ICD-10-CM

## 2019-02-15 DIAGNOSIS — C61 Malignant neoplasm of prostate: Secondary | ICD-10-CM

## 2019-02-15 DIAGNOSIS — G893 Neoplasm related pain (acute) (chronic): Secondary | ICD-10-CM

## 2019-02-15 DIAGNOSIS — C7951 Secondary malignant neoplasm of bone: Secondary | ICD-10-CM

## 2019-02-16 ENCOUNTER — Other Ambulatory Visit: Payer: Self-pay

## 2019-02-16 ENCOUNTER — Inpatient Hospital Stay (HOSPITAL_COMMUNITY): Payer: Medicare Other

## 2019-02-16 ENCOUNTER — Encounter (HOSPITAL_COMMUNITY): Payer: Self-pay | Admitting: Hematology

## 2019-02-16 ENCOUNTER — Inpatient Hospital Stay (HOSPITAL_BASED_OUTPATIENT_CLINIC_OR_DEPARTMENT_OTHER): Payer: Medicare Other | Admitting: Hematology

## 2019-02-16 VITALS — BP 118/76 | HR 68 | Temp 97.9°F | Resp 18

## 2019-02-16 DIAGNOSIS — C7951 Secondary malignant neoplasm of bone: Secondary | ICD-10-CM | POA: Diagnosis not present

## 2019-02-16 DIAGNOSIS — Z5111 Encounter for antineoplastic chemotherapy: Secondary | ICD-10-CM | POA: Diagnosis not present

## 2019-02-16 DIAGNOSIS — G893 Neoplasm related pain (acute) (chronic): Secondary | ICD-10-CM

## 2019-02-16 DIAGNOSIS — E538 Deficiency of other specified B group vitamins: Secondary | ICD-10-CM

## 2019-02-16 DIAGNOSIS — C61 Malignant neoplasm of prostate: Secondary | ICD-10-CM

## 2019-02-16 DIAGNOSIS — E559 Vitamin D deficiency, unspecified: Secondary | ICD-10-CM | POA: Diagnosis not present

## 2019-02-16 DIAGNOSIS — D649 Anemia, unspecified: Secondary | ICD-10-CM | POA: Diagnosis not present

## 2019-02-16 DIAGNOSIS — Z23 Encounter for immunization: Secondary | ICD-10-CM | POA: Diagnosis not present

## 2019-02-16 DIAGNOSIS — M899 Disorder of bone, unspecified: Secondary | ICD-10-CM

## 2019-02-16 DIAGNOSIS — Z5189 Encounter for other specified aftercare: Secondary | ICD-10-CM | POA: Diagnosis not present

## 2019-02-16 LAB — CBC
HCT: 33.5 % — ABNORMAL LOW (ref 39.0–52.0)
Hemoglobin: 10.3 g/dL — ABNORMAL LOW (ref 13.0–17.0)
MCH: 30.8 pg (ref 26.0–34.0)
MCHC: 30.7 g/dL (ref 30.0–36.0)
MCV: 100.3 fL — ABNORMAL HIGH (ref 80.0–100.0)
Platelets: 347 10*3/uL (ref 150–400)
RBC: 3.34 MIL/uL — ABNORMAL LOW (ref 4.22–5.81)
RDW: 17.6 % — ABNORMAL HIGH (ref 11.5–15.5)
WBC: 7.1 10*3/uL (ref 4.0–10.5)
nRBC: 0 % (ref 0.0–0.2)

## 2019-02-16 LAB — COMPREHENSIVE METABOLIC PANEL
ALT: 19 U/L (ref 0–44)
AST: 22 U/L (ref 15–41)
Albumin: 4 g/dL (ref 3.5–5.0)
Alkaline Phosphatase: 79 U/L (ref 38–126)
Anion gap: 6 (ref 5–15)
BUN: 23 mg/dL (ref 8–23)
CO2: 20 mmol/L — ABNORMAL LOW (ref 22–32)
Calcium: 8.4 mg/dL — ABNORMAL LOW (ref 8.9–10.3)
Chloride: 109 mmol/L (ref 98–111)
Creatinine, Ser: 1.76 mg/dL — ABNORMAL HIGH (ref 0.61–1.24)
GFR calc Af Amer: 44 mL/min — ABNORMAL LOW (ref >60)
GFR calc non Af Amer: 38 mL/min — ABNORMAL LOW (ref >60)
Glucose, Bld: 76 mg/dL (ref 70–99)
Potassium: 5 mmol/L (ref 3.5–5.1)
Sodium: 135 mmol/L (ref 135–145)
Total Bilirubin: 0.2 mg/dL — ABNORMAL LOW (ref 0.3–1.2)
Total Protein: 7.8 g/dL (ref 6.5–8.1)

## 2019-02-16 MED ORDER — SODIUM CHLORIDE 0.9 % IV SOLN
Freq: Once | INTRAVENOUS | Status: AC
  Administered 2019-02-16: 11:00:00 via INTRAVENOUS

## 2019-02-16 MED ORDER — HEPARIN SOD (PORK) LOCK FLUSH 100 UNIT/ML IV SOLN
500.0000 [IU] | Freq: Once | INTRAVENOUS | Status: AC | PRN
  Administered 2019-02-16: 500 [IU]

## 2019-02-16 MED ORDER — CYANOCOBALAMIN 1000 MCG/ML IJ SOLN
1000.0000 ug | Freq: Once | INTRAMUSCULAR | Status: AC
  Administered 2019-02-16: 1000 ug via INTRAMUSCULAR
  Filled 2019-02-16: qty 1

## 2019-02-16 MED ORDER — SODIUM CHLORIDE 0.9 % IV SOLN
20.0000 mg/m2 | Freq: Once | INTRAVENOUS | Status: AC
  Administered 2019-02-16: 41 mg via INTRAVENOUS
  Filled 2019-02-16: qty 4.1

## 2019-02-16 MED ORDER — SODIUM CHLORIDE 0.9% FLUSH
10.0000 mL | INTRAVENOUS | Status: DC | PRN
  Administered 2019-02-16: 10 mL
  Filled 2019-02-16: qty 10

## 2019-02-16 MED ORDER — DENOSUMAB 120 MG/1.7ML ~~LOC~~ SOLN
120.0000 mg | Freq: Once | SUBCUTANEOUS | Status: AC
  Administered 2019-02-16: 120 mg via SUBCUTANEOUS
  Filled 2019-02-16: qty 1.7

## 2019-02-16 MED ORDER — INFLUENZA VAC A&B SA ADJ QUAD 0.5 ML IM PRSY
0.5000 mL | PREFILLED_SYRINGE | Freq: Once | INTRAMUSCULAR | Status: AC
  Administered 2019-02-16: 0.5 mL via INTRAMUSCULAR

## 2019-02-16 MED ORDER — SODIUM CHLORIDE 0.9 % IV SOLN
10.0000 mg | Freq: Once | INTRAVENOUS | Status: AC
  Administered 2019-02-16: 10 mg via INTRAVENOUS
  Filled 2019-02-16: qty 10

## 2019-02-16 MED ORDER — FAMOTIDINE IN NACL 20-0.9 MG/50ML-% IV SOLN
20.0000 mg | Freq: Once | INTRAVENOUS | Status: AC
  Administered 2019-02-16: 11:00:00 20 mg via INTRAVENOUS
  Filled 2019-02-16: qty 50

## 2019-02-16 MED ORDER — INFLUENZA VAC A&B SA ADJ QUAD 0.5 ML IM PRSY
PREFILLED_SYRINGE | INTRAMUSCULAR | Status: AC
  Filled 2019-02-16: qty 0.5

## 2019-02-16 MED ORDER — DIPHENHYDRAMINE HCL 50 MG/ML IJ SOLN
25.0000 mg | Freq: Once | INTRAMUSCULAR | Status: AC
  Administered 2019-02-16: 25 mg via INTRAVENOUS
  Filled 2019-02-16: qty 1

## 2019-02-16 MED ORDER — SODIUM CHLORIDE 0.9 % IV SOLN
510.0000 mg | Freq: Once | INTRAVENOUS | Status: AC
  Administered 2019-02-16: 510 mg via INTRAVENOUS
  Filled 2019-02-16: qty 17

## 2019-02-16 MED ORDER — PEGFILGRASTIM 6 MG/0.6ML ~~LOC~~ PSKT
6.0000 mg | PREFILLED_SYRINGE | Freq: Once | SUBCUTANEOUS | Status: AC
  Administered 2019-02-16: 6 mg via SUBCUTANEOUS
  Filled 2019-02-16: qty 0.6

## 2019-02-16 NOTE — Patient Instructions (Signed)
[facility] Discharge Instructions for Patients Receiving Chemotherapy  Today you received the following chemotherapy agents   To help prevent nausea and vomiting after your treatment, we encourage you to take your nausea medication   If you develop nausea and vomiting that is not controlled by your nausea medication, call the clinic.   BELOW ARE SYMPTOMS THAT SHOULD BE REPORTED IMMEDIATELY:  *FEVER GREATER THAN 100.5 F  *CHILLS WITH OR WITHOUT FEVER  NAUSEA AND VOMITING THAT IS NOT CONTROLLED WITH YOUR NAUSEA MEDICATION  *UNUSUAL SHORTNESS OF BREATH  *UNUSUAL BRUISING OR BLEEDING  TENDERNESS IN MOUTH AND THROAT WITH OR WITHOUT PRESENCE OF ULCERS  *URINARY PROBLEMS  *BOWEL PROBLEMS  UNUSUAL RASH Items with * indicate a potential emergency and should be followed up as soon as possible.  Feel free to call the clinic should you have any questions or concerns. The clinic phone number is [phone].  Please show the CHEMO ALERT CARD at check-in to the Emergency Department and triage nurse.   

## 2019-02-16 NOTE — Patient Instructions (Signed)
[place] at [facility] Discharge Instructions  You were seen today by Dr. Delton Coombes. He went over your recent lab results. He will see you back in 3 weeks for labs, Treatment, [place] and follow up.   Thank you for choosing [facility] at [facility] to provide your oncology and hematology care.  To afford each patient quality time with our provider, please arrive at least 15 minutes before your scheduled appointment time.   If you have a lab appointment with the [facility] please come in thru the  Main Entrance and check in at the main information desk  You need to re-schedule your appointment should you arrive 10 or more minutes late.  We strive to give you quality time with our providers, and arriving late affects you and other patients whose appointments are after yours.  Also, if you no show three or more times for appointments you may be dismissed from the clinic at the providers discretion.     Again, thank you for choosing [facility].  Our hope is that these requests will decrease the amount of time that you wait before being seen by our physicians.       _____________________________________________________________  Should you have questions after your visit to [facility], please contact our office at [phone] between the hours of 8:00 a.m. and 4:30 p.m.  Voicemails left after 4:00 p.m. will not be returned until the following business day.  For prescription refill requests, have your pharmacy contact our office and allow 72 hours.    Cancer Center Support Programs:   > Cancer Support Group  2nd Tuesday of the month 1pm-2pm, Journey Room

## 2019-02-16 NOTE — Progress Notes (Signed)
[place], [place] [address]   CLINIC:  Medical Oncology/Hematology  PCP:  Holley Bouche, NP (Inactive) No address on file None   REASON FOR VISIT: Follow-up for prostate cancer  CURRENT THERAPY:  Cabazitaxel every 3 weeks   BRIEF ONCOLOGIC HISTORY:  Oncology History  Malignant neoplasm of prostate ([place])  01/26/2014 Tumor Marker   PSA > 5000   01/28/2014 Initial Biopsy   Metastatic adenocarcinoma of prostate   02/05/2014 Surgery   Bilateral orchiectomy by Dr. Junious Silk   02/26/2014 Tumor Marker   PSA = 399.5   03/14/2014 Tumor Marker   PSA= 89.51   03/14/2014 - 06/26/2014 Chemotherapy   Docetaxel 75 mg/kg every 21 days x 6 cycles   04/24/2014 Tumor Marker   PSA- 19.89   07/23/2014 Procedure   Nephrostomy tube removed by IR, Dr. Geroge Baseman   07/24/2014 Tumor Marker   PSA= 6.15   10/16/2014 Tumor Marker   PSA: 3.54    01/08/2015 Tumor Marker   PSA: 2.13    01/17/2015 Imaging   CT CAP-  Massive pelvic and retroperitoneal lymphadenopathy noted on the prior study has nearly completely resolved, now with only a small amount of residual amorphous soft tissue predominantly around the the infrarenal abdominal aorta.   01/17/2015 Imaging   Bone scan- Widespread osseous metastatic disease with multiple foci of increased activity throughout the skeleton, corresponding with the findings on the prior CT from October, 2015.   04/11/2015 Tumor Marker   PSA: 1.87    07/21/2015 Imaging   Bone scan- Bony metastatic disease again noted at multiple sites, stable from prior study. No progression of bony metastatic disease is demonstrable on this study.   07/25/2015 Imaging   CT CAP- Stable matted soft tissue density in the retroperitoneum and extraperitoneal pelvis consistent with treated disease. No recurrent lymphadenopathy.  No pulmonary metastatic disease. Diffuse stable sclerotic metastatic bone disease.   01/14/2016 Imaging   Bone  scan-  Multiple sites of abnormal increased tracer localization involving BILATERAL ribs, T11, T12, questionably RIGHT scapula and LEFT iliac bone suspicious for osseous metastases.   01/23/2016 Imaging   CT CAP- 1. Similar widespread osseous metastasis. 2. Similar soft tissue thickening within the retroperitoneum of the abdomen. Improved left pelvic side wall soft tissue thickening. These are consistent with sites of treated disease. No well-defined adenopathy. 3. No new sites of disease.   05/03/2016 -  Chemotherapy   Jevtana every 21 days    05/03/2016 -  Chemotherapy   The patient had pegfilgrastim (NEULASTA) injection 6 mg, 6 mg, Subcutaneous,  Once, 1 of 1 cycle Administration: 6 mg (05/05/2016) pegfilgrastim (NEULASTA ONPRO KIT) injection 6 mg, 6 mg, Subcutaneous, Once, 42 of 44 cycles Administration: 6 mg (05/24/2016), 6 mg (06/14/2016), 6 mg (07/07/2016), 6 mg (07/28/2016), 6 mg (08/18/2016), 6 mg (09/08/2016), 6 mg (09/29/2016), 6 mg (10/20/2016), 6 mg (11/10/2016), 6 mg (02/14/2017), 6 mg (03/07/2017), 6 mg (03/28/2017), 6 mg (04/20/2017), 6 mg (05/11/2017), 6 mg (06/27/2017), 6 mg (08/30/2017), 6 mg (09/20/2017), 6 mg (10/11/2017), 6 mg (11/02/2017), 6 mg (08/09/2017), 6 mg (11/23/2017), 6 mg (12/14/2017), 6 mg (01/09/2018), 6 mg (01/31/2018), 6 mg (04/04/2018), 6 mg (04/27/2018), 6 mg (05/18/2018), 6 mg (06/08/2018), 6 mg (06/29/2018), 6 mg (07/20/2018), 6 mg (08/10/2018), 6 mg (09/01/2018), 6 mg (09/22/2018), 6 mg (10/13/2018), 6 mg (11/03/2018), 6 mg (11/24/2018), 6 mg (12/15/2018), 6 mg (01/05/2019), 6 mg (01/26/2019), 6 mg (02/16/2019) cabazitaxel (JEVTANA) 41 mg in dextrose 5 % 250  mL chemo infusion, 20 mg/m2 = 41 mg (100 % of original dose 20 mg/m2), Intravenous,  Once, 43 of 45 cycles Dose modification: 20 mg/m2 (original dose 20 mg/m2, Cycle 1, Reason: Dose not tolerated, Comment: recommended by Data to use 3m/m), 20 mg/m2 (original dose 20 mg/m2, Cycle 37, Reason: Other (see comments), Comment: Changed to new  orderable), 21 mg/m2 (original dose 20 mg/m2, Cycle 40, Reason: Other (see comments)), 20 mg/m2 (original dose 20 mg/m2, Cycle 40, Reason: Other (see comments)) Administration: 41 mg (05/03/2016), 41 mg (05/24/2016), 41 mg (06/14/2016), 41 mg (07/07/2016), 41 mg (07/28/2016), 41 mg (08/18/2016), 41 mg (09/08/2016), 41 mg (09/29/2016), 41 mg (10/20/2016), 41 mg (11/10/2016), 41 mg (02/14/2017), 41 mg (03/07/2017), 41 mg (03/28/2017), 41 mg (04/20/2017), 41 mg (05/11/2017), 41 mg (06/27/2017), 41 mg (08/30/2017), 41 mg (09/20/2017), 41 mg (10/11/2017), 41 mg (11/02/2017), 41 mg (08/09/2017), 41 mg (11/23/2017), 41 mg (12/14/2017), 41 mg (01/09/2018), 41 mg (01/31/2018), 41 mg (02/21/2018), 41 mg (04/04/2018), 41 mg (04/27/2018), 41 mg (05/18/2018), 41 mg (06/08/2018), 41 mg (06/29/2018), 41 mg (07/20/2018), 41 mg (08/10/2018), 41 mg (09/01/2018), 41 mg (09/22/2018), 41 mg (10/13/2018), 41 mg (11/03/2018), 41 mg (11/24/2018), 41 mg (12/15/2018), 41 mg (01/05/2019), 41 mg (01/26/2019), 41 mg (02/16/2019)  for chemotherapy treatment.    09/06/2016 Imaging   Restaging CT C/A/P: IMPRESSION: 1. Similar appearance of widespread sclerotic bone metastases. 2. No change in soft tissue thickening within the retroperitoneum and left pelvic sidewall. No well defined adenopathy identified. 3. No new sites of disease 4. Aortic Atherosclerosis (ICD10-I70.0). Coronary artery calcifications noted. 5. 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 6. Stable 9 mm pancreatic cystic lesion. Favor pseudocyst. Indolent neoplasm may look similar. Attention on follow-up imaging.   09/06/2016 Imaging   Bone Scan: IMPRESSION: In this patient with known diffuse sclerotic metastatic disease, bone scan does not reveal progression of radiotracer uptake. Several of the areas of previously demonstrated radiotracer uptake appear less prominent.  Change in appearance of radiotracer uptake involving the mandible now more  notable on the right and previously more notable on the left may reflect result of dental disease rather than metastatic disease.    04/08/2017 Tumor Marker   PSA 0.89     INTERVAL HISTORY:  [name]89y.o. 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  Denies any bone pains.  Appetite and energy levels are 100%.  He is continuing to work.  Denies any tingling or numbness next 20s.  No nausea, vomiting or diarrhea or constipation reported.  No fevers, night sweats or weight loss.  No ER visits or hospitalizations.    REVIEW OF SYSTEMS:  Review of Systems  All other systems reviewed and are negative.    PAST MEDICAL/SURGICAL HISTORY:  Past Medical History:  Diagnosis Date  . Acute renal failure ([facility] 01/26/2014  . Alcohol abuse   . Anemia of chronic disease 01/26/2014  . Bilateral hydronephrosis 01/26/2014  . History of cocaine abuse ([facility] 01/26/2014  . Homelessness 01/31/2014  . Prostate cancer metastatic to multiple sites ([facility] 01/30/2014  . Sepsis ([facility]   . UTI (lower urinary tract infection)    Past Surgical History:  Procedure Laterality Date  . ORCHIECTOMY Bilateral 02/05/2014   Procedure: BILATERAL ORCHIECTOMY;  Surgeon: MFestus Aloe MD;  Location: [facility];  Service: Urology;  Laterality: Bilateral;  . PERCUTANEOUS NEPHROSTOMY Bilateral    IR Dr. EJunious Silkchanged on 04/22/2014  . PORT-A-CATH REMOVAL Right 08/08/2017  Procedure: REMOVAL PORT-A-CATH RIGHT CHEST (PROCEDURE #2);  Surgeon: Aviva Signs, MD;  Location: [facility];  Service: General;  Laterality: Right;  . PORTACATH PLACEMENT Right 03/12/14  . PORTACATH PLACEMENT Left 08/08/2017   Procedure: INSERTION POWER PORT WITH ATTACHED CATHETER LEFT SUBCLAVIAN (PROCEDURE #1);  Surgeon: Aviva Signs, MD;  Location: [facility];  Service: General;  Laterality: Left;     SOCIAL HISTORY:  Social History   Socioeconomic History  . Marital status: Single     Spouse name: Not on file  . Number of children: 1  . Years of education: 9th  . Highest education level: Not on file  Occupational History  . Occupation: disabilty    Employer: [employer]  . Financial resource strain: Not on file  . Food insecurity    Worry: Not on file    Inability: Not on file  . Transportation needs    Medical: Not on file    Non-medical: Not on file  Tobacco Use  . Smoking status: Current Every Day Smoker    Packs/day: 0.25    Years: 55.00    Pack years: 13.75    Types: Cigarettes  . Smokeless tobacco: Never Used  Substance and Sexual Activity  . Alcohol use: Yes    Alcohol/week: 1.0 standard drinks    Types: 1 Cans of beer per week  . Drug use: Yes    Types: Cocaine    Comment: last used Cocaine 01/25/14  . Sexual activity: Yes  Lifestyle  . Physical activity    Days per week: Not on file    Minutes per session: Not on file  . Stress: Not on file  Relationships  . Social Herbalist on phone: Not on file    Gets together: Not on file    Attends religious service: Not on file    Active member of club or organization: Not on file    Attends meetings of clubs or organizations: Not on file    Relationship status: Not on file  . Intimate partner violence    Fear of current or ex partner: Not on file    Emotionally abused: Not on file    Physically abused: Not on file    Forced sexual activity: Not on file  Other Topics Concern  . Not on file  Social History Narrative   Lives at [employer]   1 son, lives in [place], [place]   Can read and write in native language             FAMILY HISTORY:  Family History  Problem Relation Age of Onset  . Cancer Brother 57    CURRENT MEDICATIONS:  Outpatient Encounter Medications as of 02/16/2019  Medication Sig Note  . calcium gluconate 500 MG tablet Take 1 tablet by mouth 2 (two) times daily.   . prochlorperazine (COMPAZINE) 10 MG tablet The  day after chemo take 1 tab four times a day x 2 days. Then may take 1 tab four times a day if needed for nausea/vomiting. 07/24/2014: Just takes as needed   Facility-Administered Encounter Medications as of 02/16/2019  Medication  . cyanocobalamin ((VITAMIN B-12)) injection 1,000 mcg  . denosumab (XGEVA) injection 120 mg    ALLERGIES:  No Known Allergies   PHYSICAL EXAM:  ECOG Performance status: 1  Vitals:   02/16/19 0909  BP: (!) 104/48  Pulse: 68  Resp: 20  Temp: 97.6 F (36.4 C)  Filed Weights   02/16/19 0909  Weight: 168 lb 6.4 oz (76.4 kg)    Physical Exam Constitutional:      Appearance: Normal appearance. He is normal weight.  Cardiovascular:     Rate and Rhythm: Normal rate and regular rhythm.     Heart sounds: Normal heart sounds.  Pulmonary:     Effort: Pulmonary effort is normal.     Breath sounds: Normal breath sounds.  Abdominal:     General: Bowel sounds are normal.     Palpations: Abdomen is soft.  Musculoskeletal: Normal range of motion.  Skin:    General: Skin is warm and dry.  Neurological:     Mental Status: He is alert and oriented to person, place, and time. Mental status is at baseline.  Psychiatric:        Mood and Affect: Mood normal.        Behavior: Behavior normal.        Thought Content: Thought content normal.        Judgment: Judgment normal.      LABORATORY DATA:  I have reviewed the labs as listed.  CBC    Component Value Date/Time   WBC 7.1 02/16/2019 0811   RBC 3.34 (L) 02/16/2019 0811   HGB 10.3 (L) 02/16/2019 0811   HCT 33.5 (L) 02/16/2019 0811   PLT 347 02/16/2019 0811   MCV 100.3 (H) 02/16/2019 0811   MCH 30.8 02/16/2019 0811   MCHC 30.7 02/16/2019 0811   RDW 17.6 (H) 02/16/2019 0811   LYMPHSABS 1.5 01/25/2019 1353   MONOABS 0.5 01/25/2019 1353   EOSABS 0.0 01/25/2019 1353   BASOSABS 0.0 01/25/2019 1353   CMP Latest Ref Rng & Units 02/16/2019 01/25/2019 01/12/2019  Glucose 70 - 99 mg/dL 76 96 97  BUN 8 -  23 mg/dL 23 25(H) 20  Creatinine 0.61 - 1.24 mg/dL 1.76(H) 1.64(H) 1.56(H)  Sodium 135 - 145 mmol/L 135 136 137  Potassium 3.5 - 5.1 mmol/L 5.0 4.6 4.8  Chloride 98 - 111 mmol/L 109 110 106  CO2 22 - 32 mmol/L 20(L) 18(L) 27  Calcium 8.9 - 10.3 mg/dL 8.4(L) 8.0(L) 8.7(L)  Total Protein 6.5 - 8.1 g/dL 7.8 7.8 7.6  Total Bilirubin 0.3 - 1.2 mg/dL 0.2(L) 0.5 0.3  Alkaline Phos 38 - 126 U/L 79 67 122  AST 15 - 41 U/L '22 20 18  ' ALT 0 - 44 U/L '19 17 17   ' I have reviewed scans and discussed with the patient.   ASSESSMENT & PLAN:   Malignant neoplasm of prostate ([place]) 1.  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 -Last bone scan on 07/25/2017 showed mixed response compared to prior scan from 09/06/2016.  Last CT scan on 12/20/2016 did not show any visceral metastasis. -His PSA is remaining between 0.7 and 0.9. -He is tolerating cabazitaxel very well.  Denies any GI side effects or neuropathy. -I have reviewed labs.  He will proceed with next cycle today.  We will reevaluate him in 3 weeks.  Will consider scans only if PSA shows consistent increase.  2.  Bone metastasis: -He will continue denosumab.  Calcium is within normal limits.  Denies any jaw pains.  3.  Normocytic anemia: -Hemoglobin dropped to 10.3.  Last percent saturation was 19 and ferritin was 83. -I have recommended 2 infusions of Feraheme.  We discussed the side effects including anaphylactic reactions  in detail.  We will proceed with first infusion today and next infusion in 3 weeks.  4.  Vitamin D deficiency: -He is continuing vitamin D 50,000 units weekly.  The level has improved to 17.5 from 8.9.   Total time spent is 25 minutes with more than 50% of the time spent face-to-face discussing treatment plan, counseling and coordination of care.  Orders placed this encounter:  No orders of the defined  types were placed in this encounter.     Francene Finders, FNP-C [place] [phone]

## 2019-02-16 NOTE — Progress Notes (Signed)
02/16/19  Order received to give Feraheme IVPB today - orders entered Given Xgeva today with Calcium 8.4 - reminded RN to remind patient to continue taking oral calcium at home due to low level.   T.O. Dr Rhys Martini, PharmD

## 2019-02-16 NOTE — Progress Notes (Signed)
Labs reviewed at office visit today . Will proceed as planned per MD.   Jeff [name], xgeva, High dose flu vaccine, B12 given today per orders. See MAR for details.   Jeff KitchenAdriana [name] arrived today for [facility] neulasta on body injector. See MAR for administration details. Injector in place and engaged with green light indicator on flashing. Tolerated application with out problems.  Treatment given per orders. Patient tolerated it well without problems. Vitals stable and discharged home from clinic ambulatory. Follow up as scheduled.

## 2019-02-17 ENCOUNTER — Encounter (HOSPITAL_COMMUNITY): Payer: Self-pay | Admitting: Hematology

## 2019-02-17 NOTE — Assessment & Plan Note (Signed)
1.  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 -Last bone scan on 07/25/2017 showed mixed response compared to prior scan from 09/06/2016.  Last CT scan on 12/20/2016 did not show any visceral metastasis. -His PSA is remaining between 0.7 and 0.9. -He is tolerating cabazitaxel very well.  Denies any GI side effects or neuropathy. -I have reviewed labs.  He will proceed with next cycle today.  We will reevaluate him in 3 weeks.  Will consider scans only if PSA shows consistent increase.  2.  Bone metastasis: -He will continue denosumab.  Calcium is within normal limits.  Denies any jaw pains.  3.  Normocytic anemia: -Hemoglobin dropped to 10.3.  Last percent saturation was 19 and ferritin was 83. -I have recommended 2 infusions of Feraheme.  We discussed the side effects including anaphylactic reactions in detail.  We will proceed with first infusion today and next infusion in 3 weeks.  4.  Vitamin D deficiency: -He is continuing vitamin D 50,000 units weekly.  The level has improved to 17.5 from 8.9.

## 2019-03-09 ENCOUNTER — Other Ambulatory Visit (HOSPITAL_COMMUNITY): Payer: Self-pay

## 2019-03-09 DIAGNOSIS — C7951 Secondary malignant neoplasm of bone: Secondary | ICD-10-CM

## 2019-03-09 DIAGNOSIS — R7989 Other specified abnormal findings of blood chemistry: Secondary | ICD-10-CM

## 2019-03-09 DIAGNOSIS — E538 Deficiency of other specified B group vitamins: Secondary | ICD-10-CM

## 2019-03-09 DIAGNOSIS — M899 Disorder of bone, unspecified: Secondary | ICD-10-CM

## 2019-03-09 DIAGNOSIS — C61 Malignant neoplasm of prostate: Secondary | ICD-10-CM

## 2019-03-09 DIAGNOSIS — G893 Neoplasm related pain (acute) (chronic): Secondary | ICD-10-CM

## 2019-03-12 ENCOUNTER — Inpatient Hospital Stay (HOSPITAL_COMMUNITY): Payer: Medicare Other | Attending: Hematology

## 2019-03-12 ENCOUNTER — Other Ambulatory Visit: Payer: Self-pay

## 2019-03-12 ENCOUNTER — Inpatient Hospital Stay (HOSPITAL_COMMUNITY): Payer: Medicare Other

## 2019-03-12 ENCOUNTER — Encounter (HOSPITAL_COMMUNITY): Payer: Self-pay | Admitting: Hematology

## 2019-03-12 ENCOUNTER — Inpatient Hospital Stay (HOSPITAL_BASED_OUTPATIENT_CLINIC_OR_DEPARTMENT_OTHER): Payer: Medicare Other | Admitting: Hematology

## 2019-03-12 VITALS — BP 122/78 | HR 70 | Temp 96.9°F | Resp 16

## 2019-03-12 DIAGNOSIS — C61 Malignant neoplasm of prostate: Secondary | ICD-10-CM | POA: Insufficient documentation

## 2019-03-12 DIAGNOSIS — Z5111 Encounter for antineoplastic chemotherapy: Secondary | ICD-10-CM | POA: Insufficient documentation

## 2019-03-12 DIAGNOSIS — Z5189 Encounter for other specified aftercare: Secondary | ICD-10-CM | POA: Insufficient documentation

## 2019-03-12 DIAGNOSIS — E559 Vitamin D deficiency, unspecified: Secondary | ICD-10-CM | POA: Diagnosis not present

## 2019-03-12 DIAGNOSIS — C7951 Secondary malignant neoplasm of bone: Secondary | ICD-10-CM | POA: Diagnosis not present

## 2019-03-12 DIAGNOSIS — G893 Neoplasm related pain (acute) (chronic): Secondary | ICD-10-CM

## 2019-03-12 DIAGNOSIS — M899 Disorder of bone, unspecified: Secondary | ICD-10-CM

## 2019-03-12 DIAGNOSIS — Z79899 Other long term (current) drug therapy: Secondary | ICD-10-CM | POA: Insufficient documentation

## 2019-03-12 DIAGNOSIS — D649 Anemia, unspecified: Secondary | ICD-10-CM | POA: Insufficient documentation

## 2019-03-12 DIAGNOSIS — Z9221 Personal history of antineoplastic chemotherapy: Secondary | ICD-10-CM | POA: Diagnosis not present

## 2019-03-12 DIAGNOSIS — R7989 Other specified abnormal findings of blood chemistry: Secondary | ICD-10-CM

## 2019-03-12 DIAGNOSIS — E538 Deficiency of other specified B group vitamins: Secondary | ICD-10-CM

## 2019-03-12 DIAGNOSIS — F17210 Nicotine dependence, cigarettes, uncomplicated: Secondary | ICD-10-CM | POA: Diagnosis not present

## 2019-03-12 LAB — COMPREHENSIVE METABOLIC PANEL
ALT: 24 U/L (ref 0–44)
AST: 24 U/L (ref 15–41)
Albumin: 3.7 g/dL (ref 3.5–5.0)
Alkaline Phosphatase: 62 U/L (ref 38–126)
Anion gap: 9 (ref 5–15)
BUN: 14 mg/dL (ref 8–23)
CO2: 24 mmol/L (ref 22–32)
Calcium: 8.1 mg/dL — ABNORMAL LOW (ref 8.9–10.3)
Chloride: 105 mmol/L (ref 98–111)
Creatinine, Ser: 1.56 mg/dL — ABNORMAL HIGH (ref 0.61–1.24)
GFR calc Af Amer: 51 mL/min — ABNORMAL LOW (ref >60)
GFR calc non Af Amer: 44 mL/min — ABNORMAL LOW (ref >60)
Glucose, Bld: 100 mg/dL — ABNORMAL HIGH (ref 70–99)
Potassium: 4.2 mmol/L (ref 3.5–5.1)
Sodium: 138 mmol/L (ref 135–145)
Total Bilirubin: 0.4 mg/dL (ref 0.3–1.2)
Total Protein: 7.2 g/dL (ref 6.5–8.1)

## 2019-03-12 LAB — VITAMIN B12: Vitamin B-12: 1251 pg/mL — ABNORMAL HIGH (ref 180–914)

## 2019-03-12 LAB — CBC WITH DIFFERENTIAL/PLATELET
Abs Immature Granulocytes: 0.02 10*3/uL (ref 0.00–0.07)
Basophils Absolute: 0 10*3/uL (ref 0.0–0.1)
Basophils Relative: 0 %
Eosinophils Absolute: 0.1 10*3/uL (ref 0.0–0.5)
Eosinophils Relative: 1 %
HCT: 33 % — ABNORMAL LOW (ref 39.0–52.0)
Hemoglobin: 10 g/dL — ABNORMAL LOW (ref 13.0–17.0)
Immature Granulocytes: 0 %
Lymphocytes Relative: 23 %
Lymphs Abs: 1.4 10*3/uL (ref 0.7–4.0)
MCH: 31.1 pg (ref 26.0–34.0)
MCHC: 30.3 g/dL (ref 30.0–36.0)
MCV: 102.5 fL — ABNORMAL HIGH (ref 80.0–100.0)
Monocytes Absolute: 0.9 10*3/uL (ref 0.1–1.0)
Monocytes Relative: 15 %
Neutro Abs: 3.8 10*3/uL (ref 1.7–7.7)
Neutrophils Relative %: 61 %
Platelets: 332 10*3/uL (ref 150–400)
RBC: 3.22 MIL/uL — ABNORMAL LOW (ref 4.22–5.81)
RDW: 16.8 % — ABNORMAL HIGH (ref 11.5–15.5)
WBC: 6.3 10*3/uL (ref 4.0–10.5)
nRBC: 0 % (ref 0.0–0.2)

## 2019-03-12 LAB — IRON AND TIBC
Iron: 46 ug/dL (ref 45–182)
Saturation Ratios: 21 % (ref 17.9–39.5)
TIBC: 216 ug/dL — ABNORMAL LOW (ref 250–450)
UIBC: 170 ug/dL

## 2019-03-12 LAB — MAGNESIUM: Magnesium: 1.7 mg/dL (ref 1.7–2.4)

## 2019-03-12 LAB — PSA: Prostatic Specific Antigen: 0.74 ng/mL (ref 0.00–4.00)

## 2019-03-12 LAB — LACTATE DEHYDROGENASE: LDH: 161 U/L (ref 98–192)

## 2019-03-12 LAB — FERRITIN: Ferritin: 328 ng/mL (ref 24–336)

## 2019-03-12 MED ORDER — DIPHENHYDRAMINE HCL 50 MG/ML IJ SOLN
25.0000 mg | Freq: Once | INTRAMUSCULAR | Status: AC
  Administered 2019-03-12: 25 mg via INTRAVENOUS
  Filled 2019-03-12: qty 1

## 2019-03-12 MED ORDER — SODIUM CHLORIDE 0.9 % IV SOLN
10.0000 mg | Freq: Once | INTRAVENOUS | Status: AC
  Administered 2019-03-12: 10 mg via INTRAVENOUS
  Filled 2019-03-12: qty 10

## 2019-03-12 MED ORDER — CYANOCOBALAMIN 1000 MCG/ML IJ SOLN
1000.0000 ug | Freq: Once | INTRAMUSCULAR | Status: AC
  Administered 2019-03-12: 1000 ug via INTRAMUSCULAR
  Filled 2019-03-12: qty 1

## 2019-03-12 MED ORDER — FAMOTIDINE IN NACL 20-0.9 MG/50ML-% IV SOLN
20.0000 mg | Freq: Once | INTRAVENOUS | Status: AC
  Administered 2019-03-12: 20 mg via INTRAVENOUS
  Filled 2019-03-12: qty 50

## 2019-03-12 MED ORDER — SODIUM CHLORIDE 0.9 % IV SOLN
20.0000 mg/m2 | Freq: Once | INTRAVENOUS | Status: AC
  Administered 2019-03-12: 41 mg via INTRAVENOUS
  Filled 2019-03-12: qty 4.1

## 2019-03-12 MED ORDER — SODIUM CHLORIDE 0.9 % IV SOLN
510.0000 mg | Freq: Once | INTRAVENOUS | Status: AC
  Administered 2019-03-12: 510 mg via INTRAVENOUS
  Filled 2019-03-12: qty 510

## 2019-03-12 MED ORDER — DENOSUMAB 120 MG/1.7ML ~~LOC~~ SOLN
120.0000 mg | Freq: Once | SUBCUTANEOUS | Status: AC
  Administered 2019-03-12: 120 mg via SUBCUTANEOUS
  Filled 2019-03-12: qty 1.7

## 2019-03-12 MED ORDER — PEGFILGRASTIM 6 MG/0.6ML ~~LOC~~ PSKT
6.0000 mg | PREFILLED_SYRINGE | Freq: Once | SUBCUTANEOUS | Status: AC
  Administered 2019-03-12: 6 mg via SUBCUTANEOUS
  Filled 2019-03-12: qty 0.6

## 2019-03-12 MED ORDER — SODIUM CHLORIDE 0.9 % IV SOLN
Freq: Once | INTRAVENOUS | Status: AC
  Administered 2019-03-12: 09:00:00 via INTRAVENOUS

## 2019-03-12 MED ORDER — SODIUM CHLORIDE 0.9 % IV SOLN
Freq: Once | INTRAVENOUS | Status: AC
  Administered 2019-03-12: 11:00:00 via INTRAVENOUS

## 2019-03-12 NOTE — Patient Instructions (Signed)
[facility] Discharge Instructions for Patients Receiving Chemotherapy  Today you received the following chemotherapy agents   To help prevent nausea and vomiting after your treatment, we encourage you to take your nausea medication   If you develop nausea and vomiting that is not controlled by your nausea medication, call the clinic.   BELOW ARE SYMPTOMS THAT SHOULD BE REPORTED IMMEDIATELY:  *FEVER GREATER THAN 100.5 F  *CHILLS WITH OR WITHOUT FEVER  NAUSEA AND VOMITING THAT IS NOT CONTROLLED WITH YOUR NAUSEA MEDICATION  *UNUSUAL SHORTNESS OF BREATH  *UNUSUAL BRUISING OR BLEEDING  TENDERNESS IN MOUTH AND THROAT WITH OR WITHOUT PRESENCE OF ULCERS  *URINARY PROBLEMS  *BOWEL PROBLEMS  UNUSUAL RASH Items with * indicate a potential emergency and should be followed up as soon as possible.  Feel free to call the clinic should you have any questions or concerns. The clinic phone number is [phone].  Please show the CHEMO ALERT CARD at check-in to the Emergency Department and triage nurse.   

## 2019-03-12 NOTE — Assessment & Plan Note (Signed)
1.  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 -Last bone scan on 07/25/2017 showed mixed response compared to prior scan from 09/06/2016.  Last CT scan on 12/20/2016 did not show any visceral metastasis. -His PSA is remaining between 0.7 and 0.9. -He is continuing to tolerate cabazitaxel very well.  He denies any GI side effects or neurotoxicity. -I have reviewed his labs.  They are adequate to proceed with cabazitaxel today without any dose modifications.  We will see him back in 4 weeks for follow-up.  2.  Bone metastasis: -He will continue denosumab.  Calcium is within normal limits.  Denies any jaw pains.  3.  Normocytic anemia: -Hemoglobin dropped to 10.3.  Last percent saturation was 19 and ferritin was 83. -Feraheme on 02/16/2019 and 03/12/2019.  4.  Vitamin D deficiency: -He is continuing vitamin D 50,000 units weekly.  The level has improved to 17.5 from 8.9.

## 2019-03-12 NOTE — Progress Notes (Signed)
[place], [place] [address]   CLINIC:  Medical Oncology/Hematology  PCP:  Jeff Bouche, NP (Inactive) [place] [phone]   REASON FOR VISIT: Follow-up for prostate cancer  CURRENT THERAPY:  Cabazitaxel every 3 weeks   BRIEF ONCOLOGIC HISTORY:  Oncology History  Malignant neoplasm of prostate ([place])  01/26/2014 Tumor Marker   PSA > 5000   01/28/2014 Initial Biopsy   Metastatic adenocarcinoma of prostate   02/05/2014 Surgery   Bilateral orchiectomy by Dr. Junious [name]   02/26/2014 Tumor Marker   PSA = 399.5   03/14/2014 Tumor Marker   PSA= 89.51   03/14/2014 - 06/26/2014 Chemotherapy   Docetaxel 75 mg/kg every 21 days x 6 cycles   04/24/2014 Tumor Marker   PSA- 19.89   07/23/2014 Procedure   Nephrostomy tube removed by IR, Dr. Geroge [name]   07/24/2014 Tumor Marker   PSA= 6.15   10/16/2014 Tumor Marker   PSA: 3.54    01/08/2015 Tumor Marker   PSA: 2.13    01/17/2015 Imaging   CT CAP-  Massive pelvic and retroperitoneal lymphadenopathy noted on the prior study has nearly completely resolved, now with only a small amount of residual amorphous soft tissue predominantly around the the infrarenal abdominal aorta.   01/17/2015 Imaging   Bone scan- Widespread osseous metastatic disease with multiple foci of increased activity throughout the skeleton, corresponding with the findings on the prior CT from October, 2015.   04/11/2015 Tumor Marker   PSA: 1.87    07/21/2015 Imaging   Bone scan- Bony metastatic disease again noted at multiple sites, stable from prior study. No progression of bony metastatic disease is demonstrable on this study.   07/25/2015 Imaging   CT CAP- Stable matted soft tissue density in the retroperitoneum and extraperitoneal pelvis consistent with treated disease. No recurrent lymphadenopathy.  No pulmonary metastatic disease. Diffuse stable sclerotic metastatic bone disease.     01/14/2016 Imaging   Bone scan-  Multiple sites of abnormal increased tracer localization involving BILATERAL ribs, T11, T12, questionably RIGHT scapula and LEFT iliac bone suspicious for osseous metastases.   01/23/2016 Imaging   CT CAP- 1. Similar widespread osseous metastasis. 2. Similar soft tissue thickening within the retroperitoneum of the abdomen. Improved left pelvic side wall soft tissue thickening. These are consistent with sites of treated disease. No well-defined adenopathy. 3. No new sites of disease.   05/03/2016 -  Chemotherapy   Jevtana every 21 days    05/03/2016 -  Chemotherapy   The patient had pegfilgrastim (NEULASTA) injection 6 mg, 6 mg, Subcutaneous,  Once, 1 of 1 cycle Administration: 6 mg (05/05/2016) pegfilgrastim (NEULASTA ONPRO KIT) injection 6 mg, 6 mg, Subcutaneous, Once, 43 of 46 cycles Administration: 6 mg (05/24/2016), 6 mg (06/14/2016), 6 mg (07/07/2016), 6 mg (07/28/2016), 6 mg (08/18/2016), 6 mg (09/08/2016), 6 mg (09/29/2016), 6 mg (10/20/2016), 6 mg (11/10/2016), 6 mg (02/14/2017), 6 mg (03/07/2017), 6 mg (03/28/2017), 6 mg (04/20/2017), 6 mg (05/11/2017), 6 mg (06/27/2017), 6 mg (08/30/2017), 6 mg (09/20/2017), 6 mg (10/11/2017), 6 mg (11/02/2017), 6 mg (08/09/2017), 6 mg (11/23/2017), 6 mg (12/14/2017), 6 mg (01/09/2018), 6 mg (01/31/2018), 6 mg (04/04/2018), 6 mg (04/27/2018), 6 mg (05/18/2018), 6 mg (06/08/2018), 6 mg (06/29/2018), 6 mg (07/20/2018), 6 mg (08/10/2018), 6 mg (09/01/2018), 6 mg (09/22/2018), 6 mg (10/13/2018), 6 mg (11/03/2018), 6 mg (11/24/2018), 6 mg (12/15/2018), 6 mg (01/05/2019), 6 mg (01/26/2019), 6 mg (02/16/2019), 6 mg (03/12/2019) cabazitaxel (JEVTANA)  41 mg in dextrose 5 % 250 mL chemo infusion, 20 mg/m2 = 41 mg (100 % of original dose 20 mg/m2), Intravenous,  Once, 44 of 47 cycles Dose modification: 20 mg/m2 (original dose 20 mg/m2, Cycle 1, Reason: Dose not tolerated, Comment: recommended by Data to use 76m/m), 20 mg/m2 (original dose 20 mg/m2, Cycle 37, Reason: Other  (see comments), Comment: Changed to new orderable), 21 mg/m2 (original dose 20 mg/m2, Cycle 40, Reason: Other (see comments)), 20 mg/m2 (original dose 20 mg/m2, Cycle 40, Reason: Other (see comments)) Administration: 41 mg (05/03/2016), 41 mg (05/24/2016), 41 mg (06/14/2016), 41 mg (07/07/2016), 41 mg (07/28/2016), 41 mg (08/18/2016), 41 mg (09/08/2016), 41 mg (09/29/2016), 41 mg (10/20/2016), 41 mg (11/10/2016), 41 mg (02/14/2017), 41 mg (03/07/2017), 41 mg (03/28/2017), 41 mg (04/20/2017), 41 mg (05/11/2017), 41 mg (06/27/2017), 41 mg (08/30/2017), 41 mg (09/20/2017), 41 mg (10/11/2017), 41 mg (11/02/2017), 41 mg (08/09/2017), 41 mg (11/23/2017), 41 mg (12/14/2017), 41 mg (01/09/2018), 41 mg (01/31/2018), 41 mg (02/21/2018), 41 mg (04/04/2018), 41 mg (04/27/2018), 41 mg (05/18/2018), 41 mg (06/08/2018), 41 mg (06/29/2018), 41 mg (07/20/2018), 41 mg (08/10/2018), 41 mg (09/01/2018), 41 mg (09/22/2018), 41 mg (10/13/2018), 41 mg (11/03/2018), 41 mg (11/24/2018), 41 mg (12/15/2018), 41 mg (01/05/2019), 41 mg (01/26/2019), 41 mg (02/16/2019), 41 mg (03/12/2019)  for chemotherapy treatment.    09/06/2016 Imaging   Restaging CT C/A/P: IMPRESSION: 1. Similar appearance of widespread sclerotic bone metastases. 2. No change in soft tissue thickening within the retroperitoneum and left pelvic sidewall. No well defined adenopathy identified. 3. No new sites of disease 4. Aortic Atherosclerosis (ICD10-I70.0). Coronary artery calcifications noted. 5. 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 6. Stable 9 mm pancreatic cystic lesion. Favor pseudocyst. Indolent neoplasm may look similar. Attention on follow-up imaging.   09/06/2016 Imaging   Bone Scan: IMPRESSION: In this patient with known diffuse sclerotic metastatic disease, bone scan does not reveal progression of radiotracer uptake. Several of the areas of previously demonstrated radiotracer uptake appear less prominent.  Change in  appearance of radiotracer uptake involving the mandible now more notable on the right and previously more notable on the left may reflect result of dental disease rather than metastatic disease.    04/08/2017 Tumor Marker   PSA 0.89     INTERVAL HISTORY:  [name]89y.o. 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  Appetite is 100%.  Energy levels are 75%.  No pain is reported.  Denies any nausea, vomiting, diarrhea or constipation after last cycle of chemotherapy.  Denies any fevers or infections.  Is continuing to work full-time.   REVIEW OF SYSTEMS:  Review of Systems  All other systems reviewed and are negative.    PAST MEDICAL/SURGICAL HISTORY:  Past Medical History:  Diagnosis Date   Acute renal failure ([facility] 01/26/2014   Alcohol abuse    Anemia of chronic disease 01/26/2014   Bilateral hydronephrosis 01/26/2014   History of cocaine abuse ([facility] 01/26/2014   Homelessness 01/31/2014   Prostate cancer metastatic to multiple sites ([facility] 01/30/2014   Sepsis ([facility]    UTI (lower urinary tract infection)    Past Surgical History:  Procedure Laterality Date   ORCHIECTOMY Bilateral 02/05/2014   Procedure: BILATERAL ORCHIECTOMY;  Surgeon: MFestus Aloe MD;  Location: [facility];  Service: Urology;  Laterality: Bilateral;   PERCUTANEOUS NEPHROSTOMY Bilateral    IR Dr. EJunious Silkchanged on 04/22/2014   PORT-A-CATH REMOVAL Right 08/08/2017   Procedure:  REMOVAL PORT-A-CATH RIGHT CHEST (PROCEDURE #2);  Surgeon: Aviva Signs, MD;  Location: [facility];  Service: General;  Laterality: Right;   PORTACATH PLACEMENT Right 03/12/14   PORTACATH PLACEMENT Left 08/08/2017   Procedure: INSERTION POWER PORT WITH ATTACHED CATHETER LEFT SUBCLAVIAN (PROCEDURE #1);  Surgeon: Aviva Signs, MD;  Location: [facility];  Service: General;  Laterality: Left;     SOCIAL HISTORY:  Social History   Socioeconomic History   Marital status:  Single    Spouse name: Not on file   Number of children: 1   Years of education: 9th   Highest education level: Not on file  Occupational History   Occupation: disabilty    Employer: [employer] resource strain: Not on file   Food insecurity    Worry: Not on file    Inability: Not on file   Transportation needs    Medical: Not on file    Non-medical: Not on file  Tobacco Use   Smoking status: Current Every Day Smoker    Packs/day: 0.25    Years: 55.00    Pack years: 13.75    Types: Cigarettes   Smokeless tobacco: Never Used  Substance and Sexual Activity   Alcohol use: Yes    Alcohol/week: 1.0 standard drinks    Types: 1 Cans of beer per week   Drug use: Yes    Types: Cocaine    Comment: last used Cocaine 01/25/14   Sexual activity: Yes  Lifestyle   Physical activity    Days per week: Not on file    Minutes per session: Not on file   Stress: Not on file  Relationships   Social connections    Talks on phone: Not on file    Gets together: Not on file    Attends religious service: Not on file    Active member of club or organization: Not on file    Attends meetings of clubs or organizations: Not on file    Relationship status: Not on file   Intimate partner violence    Fear of current or ex partner: Not on file    Emotionally abused: Not on file    Physically abused: Not on file    Forced sexual activity: Not on file  Other Topics Concern   Not on file  Social History Narrative   Lives at [employer] 9th grade   1 son, lives in [place], [place]   Can read and write in native language             FAMILY HISTORY:  Family History  Problem Relation Age of Onset   Cancer Brother 64    CURRENT MEDICATIONS:  Outpatient Encounter Medications as of 03/12/2019  Medication Sig Note   calcium gluconate 500 MG tablet Take 1 tablet by mouth 2 (two) times daily.    prochlorperazine (COMPAZINE) 10 MG  tablet The day after chemo take 1 tab four times a day x 2 days. Then may take 1 tab four times a day if needed for nausea/vomiting. (Patient not taking: Reported on 03/12/2019) 07/24/2014: Just takes as needed   Facility-Administered Encounter Medications as of 03/12/2019  Medication   cyanocobalamin ((VITAMIN B-12)) injection 1,000 mcg   denosumab (XGEVA) injection 120 mg    ALLERGIES:  No Known Allergies   PHYSICAL EXAM:  ECOG Performance status: 1  Vitals:   03/12/19 0825  BP: (!) 112/56  Pulse: 86  Resp: 18  Temp: 97.9 F (36.6 C)  SpO2: 99%   Filed Weights   03/12/19 0825  Weight: 175 lb 6.4 oz (79.6 kg)    Physical Exam Constitutional:      Appearance: Normal appearance. He is normal weight.  Cardiovascular:     Rate and Rhythm: Normal rate and regular rhythm.     Heart sounds: Normal heart sounds.  Pulmonary:     Effort: Pulmonary effort is normal.     Breath sounds: Normal breath sounds.  Abdominal:     General: Bowel sounds are normal.     Palpations: Abdomen is soft.  Musculoskeletal: Normal range of motion.  Skin:    General: Skin is warm and dry.  Neurological:     Mental Status: He is alert and oriented to person, place, and time. Mental status is at baseline.  Psychiatric:        Mood and Affect: Mood normal.        Behavior: Behavior normal.        Thought Content: Thought content normal.        Judgment: Judgment normal.      LABORATORY DATA:  I have reviewed the labs as listed.  CBC    Component Value Date/Time   WBC 6.3 03/12/2019 0803   RBC 3.22 (L) 03/12/2019 0803   HGB 10.0 (L) 03/12/2019 0803   HCT 33.0 (L) 03/12/2019 0803   PLT 332 03/12/2019 0803   MCV 102.5 (H) 03/12/2019 0803   MCH 31.1 03/12/2019 0803   MCHC 30.3 03/12/2019 0803   RDW 16.8 (H) 03/12/2019 0803   LYMPHSABS 1.4 03/12/2019 0803   MONOABS 0.9 03/12/2019 0803   EOSABS 0.1 03/12/2019 0803   BASOSABS 0.0 03/12/2019 0803   CMP Latest Ref Rng & Units  03/12/2019 02/16/2019 01/25/2019  Glucose 70 - 99 mg/dL 100(H) 76 96  BUN 8 - 23 mg/dL 14 23 25(H)  Creatinine 0.61 - 1.24 mg/dL 1.56(H) 1.76(H) 1.64(H)  Sodium 135 - 145 mmol/L 138 135 136  Potassium 3.5 - 5.1 mmol/L 4.2 5.0 4.6  Chloride 98 - 111 mmol/L 105 109 110  CO2 22 - 32 mmol/L 24 20(L) 18(L)  Calcium 8.9 - 10.3 mg/dL 8.1(L) 8.4(L) 8.0(L)  Total Protein 6.5 - 8.1 g/dL 7.2 7.8 7.8  Total Bilirubin 0.3 - 1.2 mg/dL 0.4 0.2(L) 0.5  Alkaline Phos 38 - 126 U/L 62 79 67  AST 15 - 41 U/L '24 22 20  ' ALT 0 - 44 U/L '24 19 17   ' I have reviewed scans and discussed with the patient.   ASSESSMENT & PLAN:   Malignant neoplasm of prostate ([place]) 1.  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 -Last bone scan on 07/25/2017 showed mixed response compared to prior scan from 09/06/2016.  Last CT scan on 12/20/2016 did not show any visceral metastasis. -His PSA is remaining between 0.7 and 0.9. -He is continuing to tolerate cabazitaxel very well.  He denies any GI side effects or neurotoxicity. -I have reviewed his labs.  They are adequate to proceed with cabazitaxel today without any dose modifications.  We will see him back in 4 weeks for follow-up.  2.  Bone metastasis: -He will continue denosumab.  Calcium is within normal limits.  Denies any jaw pains.  3.  Normocytic anemia: -Hemoglobin dropped to 10.3.  Last percent saturation was 19 and ferritin was 83. -Feraheme on 02/16/2019 and  03/12/2019.  4.  Vitamin D deficiency: -He is continuing vitamin D 50,000 units weekly.  The level has improved to 17.5 from 8.9.     Orders placed this encounter:  No orders of the defined types were placed in this encounter.     Francene Finders, FNP-C [place] [phone]

## 2019-03-12 NOTE — Patient Instructions (Addendum)
[facility] at [facility] Discharge Instructions  You were seen today by Dr. Katragadda. He went over your recent lab results. He will see you back in 3 weeks for labs and follow up.   Thank you for choosing  Cancer Center at [facility] to provide your oncology and hematology care.  To afford each patient quality time with our provider, please arrive at least 15 minutes before your scheduled appointment time.   If you have a lab appointment with the Cancer Center please come in thru the  Main Entrance and check in at the main information desk  You need to re-schedule your appointment should you arrive 10 or more minutes late.  We strive to give you quality time with our providers, and arriving late affects you and other patients whose appointments are after yours.  Also, if you no show three or more times for appointments you may be dismissed from the clinic at the providers discretion.     Again, thank you for choosing [facility].  Our hope is that these requests will decrease the amount of time that you wait before being seen by our physicians.       _____________________________________________________________  Should you have questions after your visit to [facility], please contact our office at [phone] between the hours of 8:00 a.m. and 4:30 p.m.  Voicemails left after 4:00 p.m. will not be returned until the following business day.  For prescription refill requests, have your pharmacy contact our office and allow 72 hours.    Cancer Center Support Programs:   > Cancer Support Group  2nd Tuesday of the month 1pm-2pm, Journey Room    

## 2019-03-12 NOTE — Progress Notes (Signed)
Pt presents today for follow up with Dr.Katragadda and treatment. Pt has no complaints of any changes since the last visit. MAR reviewed and updated. Labs pending. Vital signs within parameters for tx.   Creatinine 1.56 today. All other labs within parameters for treatment. Creatinine reported to [facility] LPN.  Labs reviewed by Dr. Delton Coombes. Proceed with treatment VO .   Treatment given today per MD orders. Tolerated infusion without adverse affects. Vital signs stable. No complaints at this time. Discharged from clinic ambulatory. F/U with [facility] as scheduled.

## 2019-03-13 ENCOUNTER — Other Ambulatory Visit (HOSPITAL_COMMUNITY): Payer: Self-pay | Admitting: *Deleted

## 2019-03-13 DIAGNOSIS — C61 Malignant neoplasm of prostate: Secondary | ICD-10-CM

## 2019-03-13 LAB — VITAMIN D 25 HYDROXY (VIT D DEFICIENCY, FRACTURES): Vit D, 25-Hydroxy: 10.1 ng/mL — ABNORMAL LOW (ref 30–100)

## 2019-03-16 ENCOUNTER — Ambulatory Visit (HOSPITAL_COMMUNITY): Payer: Medicare Other

## 2019-04-02 ENCOUNTER — Other Ambulatory Visit: Payer: Self-pay

## 2019-04-02 ENCOUNTER — Encounter (HOSPITAL_COMMUNITY): Payer: Self-pay | Admitting: Hematology

## 2019-04-02 ENCOUNTER — Inpatient Hospital Stay (HOSPITAL_BASED_OUTPATIENT_CLINIC_OR_DEPARTMENT_OTHER): Payer: Medicare Other | Admitting: Hematology

## 2019-04-02 ENCOUNTER — Inpatient Hospital Stay (HOSPITAL_COMMUNITY): Payer: Medicare Other | Attending: Hematology

## 2019-04-02 ENCOUNTER — Inpatient Hospital Stay (HOSPITAL_COMMUNITY): Payer: Medicare Other

## 2019-04-02 VITALS — BP 118/74 | HR 108 | Temp 96.9°F | Resp 18 | Wt 168.4 lb

## 2019-04-02 VITALS — BP 121/76 | HR 78 | Resp 16

## 2019-04-02 DIAGNOSIS — C61 Malignant neoplasm of prostate: Secondary | ICD-10-CM | POA: Diagnosis present

## 2019-04-02 DIAGNOSIS — D631 Anemia in chronic kidney disease: Secondary | ICD-10-CM | POA: Diagnosis not present

## 2019-04-02 DIAGNOSIS — Z5189 Encounter for other specified aftercare: Secondary | ICD-10-CM | POA: Insufficient documentation

## 2019-04-02 DIAGNOSIS — N189 Chronic kidney disease, unspecified: Secondary | ICD-10-CM | POA: Insufficient documentation

## 2019-04-02 DIAGNOSIS — M899 Disorder of bone, unspecified: Secondary | ICD-10-CM

## 2019-04-02 DIAGNOSIS — E559 Vitamin D deficiency, unspecified: Secondary | ICD-10-CM

## 2019-04-02 DIAGNOSIS — C7951 Secondary malignant neoplasm of bone: Secondary | ICD-10-CM | POA: Diagnosis not present

## 2019-04-02 DIAGNOSIS — Z5111 Encounter for antineoplastic chemotherapy: Secondary | ICD-10-CM | POA: Insufficient documentation

## 2019-04-02 DIAGNOSIS — E538 Deficiency of other specified B group vitamins: Secondary | ICD-10-CM

## 2019-04-02 LAB — COMPREHENSIVE METABOLIC PANEL
ALT: 23 U/L (ref 0–44)
AST: 31 U/L (ref 15–41)
Albumin: 3.8 g/dL (ref 3.5–5.0)
Alkaline Phosphatase: 63 U/L (ref 38–126)
Anion gap: 10 (ref 5–15)
BUN: 19 mg/dL (ref 8–23)
CO2: 21 mmol/L — ABNORMAL LOW (ref 22–32)
Calcium: 8.9 mg/dL (ref 8.9–10.3)
Chloride: 105 mmol/L (ref 98–111)
Creatinine, Ser: 1.95 mg/dL — ABNORMAL HIGH (ref 0.61–1.24)
GFR calc Af Amer: 39 mL/min — ABNORMAL LOW (ref >60)
GFR calc non Af Amer: 33 mL/min — ABNORMAL LOW (ref >60)
Glucose, Bld: 89 mg/dL (ref 70–99)
Potassium: 4.5 mmol/L (ref 3.5–5.1)
Sodium: 136 mmol/L (ref 135–145)
Total Bilirubin: 0.2 mg/dL — ABNORMAL LOW (ref 0.3–1.2)
Total Protein: 7.9 g/dL (ref 6.5–8.1)

## 2019-04-02 LAB — VITAMIN B12: Vitamin B-12: 1479 pg/mL — ABNORMAL HIGH (ref 180–914)

## 2019-04-02 LAB — IRON AND TIBC
Iron: 74 ug/dL (ref 45–182)
Saturation Ratios: 35 % (ref 17.9–39.5)
TIBC: 214 ug/dL — ABNORMAL LOW (ref 250–450)
UIBC: 140 ug/dL

## 2019-04-02 LAB — CBC WITH DIFFERENTIAL/PLATELET
Abs Immature Granulocytes: 0.02 10*3/uL (ref 0.00–0.07)
Basophils Absolute: 0 10*3/uL (ref 0.0–0.1)
Basophils Relative: 0 %
Eosinophils Absolute: 0.1 10*3/uL (ref 0.0–0.5)
Eosinophils Relative: 2 %
HCT: 38 % — ABNORMAL LOW (ref 39.0–52.0)
Hemoglobin: 11.7 g/dL — ABNORMAL LOW (ref 13.0–17.0)
Immature Granulocytes: 0 %
Lymphocytes Relative: 39 %
Lymphs Abs: 2.1 10*3/uL (ref 0.7–4.0)
MCH: 31.6 pg (ref 26.0–34.0)
MCHC: 30.8 g/dL (ref 30.0–36.0)
MCV: 102.7 fL — ABNORMAL HIGH (ref 80.0–100.0)
Monocytes Absolute: 0.6 10*3/uL (ref 0.1–1.0)
Monocytes Relative: 11 %
Neutro Abs: 2.5 10*3/uL (ref 1.7–7.7)
Neutrophils Relative %: 48 %
Platelets: 337 10*3/uL (ref 150–400)
RBC: 3.7 MIL/uL — ABNORMAL LOW (ref 4.22–5.81)
RDW: 16 % — ABNORMAL HIGH (ref 11.5–15.5)
WBC: 5.3 10*3/uL (ref 4.0–10.5)
nRBC: 0 % (ref 0.0–0.2)

## 2019-04-02 LAB — MAGNESIUM: Magnesium: 2.2 mg/dL (ref 1.7–2.4)

## 2019-04-02 LAB — FOLATE: Folate: 10.6 ng/mL (ref >5.9)

## 2019-04-02 LAB — PSA: Prostatic Specific Antigen: 1.03 ng/mL (ref 0.00–4.00)

## 2019-04-02 LAB — LACTATE DEHYDROGENASE: LDH: 176 U/L (ref 98–192)

## 2019-04-02 LAB — FERRITIN: Ferritin: 565 ng/mL — ABNORMAL HIGH (ref 24–336)

## 2019-04-02 MED ORDER — ERGOCALCIFEROL 1.25 MG (50000 UT) PO CAPS: 50000.0000 [IU] | ORAL_CAPSULE | ORAL | 3 refills | Status: DC

## 2019-04-02 MED ORDER — CYANOCOBALAMIN 1000 MCG/ML IJ SOLN
1000.0000 ug | Freq: Once | INTRAMUSCULAR | Status: AC
  Administered 2019-04-02: 1000 ug via INTRAMUSCULAR
  Filled 2019-04-02: qty 1

## 2019-04-02 MED ORDER — SODIUM CHLORIDE 0.9 % IV SOLN
INTRAVENOUS | Status: DC
  Administered 2019-04-02: 11:00:00 via INTRAVENOUS

## 2019-04-02 MED ORDER — PEGFILGRASTIM 6 MG/0.6ML ~~LOC~~ PSKT
PREFILLED_SYRINGE | SUBCUTANEOUS | Status: AC
  Filled 2019-04-02: qty 0.6

## 2019-04-02 MED ORDER — HEPARIN SOD (PORK) LOCK FLUSH 100 UNIT/ML IV SOLN
500.0000 [IU] | Freq: Once | INTRAVENOUS | Status: AC | PRN
  Administered 2019-04-02: 500 [IU]

## 2019-04-02 MED ORDER — SODIUM CHLORIDE 0.9% FLUSH
10.0000 mL | INTRAVENOUS | Status: DC | PRN
  Administered 2019-04-02: 10 mL
  Filled 2019-04-02: qty 10

## 2019-04-02 MED ORDER — DENOSUMAB 120 MG/1.7ML ~~LOC~~ SOLN: 120.0000 mg | Freq: Once | SUBCUTANEOUS | Status: DC

## 2019-04-02 MED ORDER — DIPHENHYDRAMINE HCL 50 MG/ML IJ SOLN
25.0000 mg | Freq: Once | INTRAMUSCULAR | Status: AC
  Administered 2019-04-02: 25 mg via INTRAVENOUS
  Filled 2019-04-02: qty 1

## 2019-04-02 MED ORDER — FAMOTIDINE IN NACL 20-0.9 MG/50ML-% IV SOLN
20.0000 mg | Freq: Once | INTRAVENOUS | Status: AC
  Administered 2019-04-02: 20 mg via INTRAVENOUS
  Filled 2019-04-02: qty 50

## 2019-04-02 MED ORDER — LANREOTIDE ACETATE 120 MG/0.5ML ~~LOC~~ SOLN
SUBCUTANEOUS | Status: AC
  Filled 2019-04-02: qty 120

## 2019-04-02 MED ORDER — SODIUM CHLORIDE 0.9 % IV SOLN
Freq: Once | INTRAVENOUS | Status: AC
  Administered 2019-04-02: 10:00:00 via INTRAVENOUS

## 2019-04-02 MED ORDER — SODIUM CHLORIDE 0.9 % IV SOLN
20.0000 mg/m2 | Freq: Once | INTRAVENOUS | Status: AC
  Administered 2019-04-02: 41 mg via INTRAVENOUS
  Filled 2019-04-02: qty 4.1

## 2019-04-02 MED ORDER — SODIUM CHLORIDE 0.9 % IV SOLN
10.0000 mg | Freq: Once | INTRAVENOUS | Status: AC
  Administered 2019-04-02: 10 mg via INTRAVENOUS
  Filled 2019-04-02: qty 10

## 2019-04-02 MED ORDER — PEGFILGRASTIM 6 MG/0.6ML ~~LOC~~ PSKT
6.0000 mg | PREFILLED_SYRINGE | Freq: Once | SUBCUTANEOUS | Status: AC
  Administered 2019-04-02: 6 mg via SUBCUTANEOUS

## 2019-04-02 NOTE — Patient Instructions (Addendum)
[place] [facility] Discharge Instructions  You were seen today by Dr. Delton Coombes. He went over your recent results. Continue your treatments every 3 weeks. He will see you back in 6 weeks for labs, treatment and follow up.   Thank you for choosing [place] at [facility] to provide your oncology and hematology care.  To afford each patient quality time with our provider, please arrive at least 15 minutes before your scheduled appointment time.   If you have a lab appointment with the [facility] please come in thru the  Main Entrance and check in at the main information desk  You need to re-schedule your appointment should you arrive 10 or more minutes late.  We strive to give you quality time with our providers, and arriving late affects you and other patients whose appointments are after yours.  Also, if you no show three or more times for appointments you may be dismissed from the clinic at the providers discretion.     Again, thank you for choosing [facility]  Our hope is that these requests will decrease the amount of time that you wait before being seen by our physicians.       _____________________________________________________________  Should you have questions after your visit to [facility], please contact our office at [phone] between the hours of 8:00 a.m. and 4:30 p.m.  Voicemails left after 4:00 p.m. will not be returned until the following business day.  For prescription refill requests, have your pharmacy contact our office and allow 72 hours.    Cancer Center Support Programs:   > Cancer Support Group  2nd Tuesday of the month 1pm-2pm, Journey Room

## 2019-04-02 NOTE — Progress Notes (Signed)
04/02/19  Change Xgeva to every 6 weeks to coincide with chemothearpy.  V.O. Dr Alphonzo Severance Ronnald Ramp. PharmD

## 2019-04-02 NOTE — Patient Instructions (Signed)
[facility] Discharge Instructions for Patients Receiving Chemotherapy   Beginning January 23rd 2017 lab work for the [facility] will be done in the  Main lab at [facility] on 1st floor. If you have a lab appointment with the [facility] please come in thru the  Main Entrance and check in at the main information desk   Today you received the following chemotherapy agents Jevtana  To help prevent nausea and vomiting after your treatment, we encourage you to take your nausea medication   If you develop nausea and vomiting, or diarrhea that is not controlled by your medication, call the clinic.  The clinic phone number is [phone]. Office hours are Monday-Friday 8:30am-5:00pm.  BELOW ARE SYMPTOMS THAT SHOULD BE REPORTED IMMEDIATELY:  *FEVER GREATER THAN 101.0 F  *CHILLS WITH OR WITHOUT FEVER  NAUSEA AND VOMITING THAT IS NOT CONTROLLED WITH YOUR NAUSEA MEDICATION  *UNUSUAL SHORTNESS OF BREATH  *UNUSUAL BRUISING OR BLEEDING  TENDERNESS IN MOUTH AND THROAT WITH OR WITHOUT PRESENCE OF ULCERS  *URINARY PROBLEMS  *BOWEL PROBLEMS  UNUSUAL RASH Items with * indicate a potential emergency and should be followed up as soon as possible. If you have an emergency after office hours please contact your primary care physician or go to the nearest emergency department.  Please call the clinic during office hours if you have any questions or concerns.   You may also contact the Patient Navigator at [phone] should you have any questions or need assistance in obtaining follow up care.      Resources For Cancer Patients and their Caregivers ? American Cancer Society: Can assist with transportation, wigs, general needs, runs Look Good Feel Better.        519-296-8499 ? Cancer Care: Provides financial assistance, online support groups, medication/co-pay assistance.  1-800-813-HOPE 909-654-0982) ? [facility] Assists [place] cancer  patients and their families through emotional , educational and financial support.  (707)834-1289 ? [place] DSS Where to apply for food stamps, Medicaid and utility assistance. (640) 815-5736 ? [facility]: Transportation to medical appointments. 902-659-9542 ? Social Security Administration: May apply for disability if have a Stage IV cancer. 8387198638 (704)554-5458 ? [employer], Disability and Transit Services: Assists with nutrition, care and transit needs. [phone]

## 2019-04-02 NOTE — Progress Notes (Signed)
[id number] Pt seen by Dr. Delton Coombes and lab work reviewed, including Cr 1.95. Per MD, ok to proceed with treatment today. Verbal order also given to infuse an extra 500cc NS.  Jeff [name] tolerated hydration and treatment without incident or complaint. VSS. On Pro applied to back of left arm, green light visible upon discharge. Pt aware to report and leakage from pump or if it comes off. Discharged in satisfactory condition.

## 2019-04-02 NOTE — Assessment & Plan Note (Signed)
1.  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 -Last bone scan on 07/25/2017 showed mixed response compared to prior scan from 09/06/2016.  Last CT scan on 12/20/2016 did not show any visceral metastasis. -His PSA is remaining between 0.7 and 0.9. -He is tolerating cabazitaxel very well.  I have reviewed his labs.  His creatinine increased to 1.94 today. -His last PSA was 0.74 on 03/12/2019.  He will proceed with his next treatment.  I will see him back in 6 weeks for follow-up.  2.  Bone metastasis: -Denies any jaw pains.  He will continue denosumab.  Calcium is within normal limits.  3.  Normocytic anemia: -This is from combination of CKD and chemotherapy. -He received Feraheme on 02/16/2019 and 03/12/2019.  Hemoglobin today improved to 11.7.  His energy levels also improved.  4.  Vitamin D deficiency: -His vitamin D level on 03/12/2019 was 10.1. -I will send a prescription for vitamin D 50,000 units weekly to his pharmacy.  I plan to repeat vitamin D level in 2 months.  5.  CKD: -His baseline creatinine is around 1.5-1.7. -His creatinine increased to 1.94 today.  We will give him 400 mL of normal saline.

## 2019-04-02 NOTE — Progress Notes (Signed)
[facility], [place] [address]   CLINIC:  Medical Oncology/Hematology  PCP:  Holley Bouche, NP (Inactive) [place]  [id number] [phone]   REASON FOR VISIT: Follow-up for prostate cancer  CURRENT THERAPY:  Cabazitaxel every 3 weeks   BRIEF ONCOLOGIC HISTORY:  Oncology History  Malignant neoplasm of prostate ([place])  01/26/2014 Tumor Marker   PSA > 5000   01/28/2014 Initial Biopsy   Metastatic adenocarcinoma of prostate   02/05/2014 Surgery   Bilateral orchiectomy by Dr. Junious Silk   02/26/2014 Tumor Marker   PSA = 399.5   03/14/2014 Tumor Marker   PSA= 89.51   03/14/2014 - 06/26/2014 Chemotherapy   Docetaxel 75 mg/kg every 21 days x 6 cycles   04/24/2014 Tumor Marker   PSA- 19.89   07/23/2014 Procedure   Nephrostomy tube removed by IR, Dr. Geroge Baseman   07/24/2014 Tumor Marker   PSA= 6.15   10/16/2014 Tumor Marker   PSA: 3.54    01/08/2015 Tumor Marker   PSA: 2.13    01/17/2015 Imaging   CT CAP-  Massive pelvic and retroperitoneal lymphadenopathy noted on the prior study has nearly completely resolved, now with only a small amount of residual amorphous soft tissue predominantly around the the infrarenal abdominal aorta.   01/17/2015 Imaging   Bone scan- Widespread osseous metastatic disease with multiple foci of increased activity throughout the skeleton, corresponding with the findings on the prior CT from October, 2015.   04/11/2015 Tumor Marker   PSA: 1.87    07/21/2015 Imaging   Bone scan- Bony metastatic disease again noted at multiple sites, stable from prior study. No progression of bony metastatic disease is demonstrable on this study.   07/25/2015 Imaging   CT CAP- Stable matted soft tissue density in the retroperitoneum and extraperitoneal pelvis consistent with treated disease. No recurrent lymphadenopathy.  No pulmonary metastatic disease. Diffuse stable sclerotic metastatic bone disease.    01/14/2016 Imaging   Bone scan-  Multiple sites of abnormal increased tracer localization involving BILATERAL ribs, T11, T12, questionably RIGHT scapula and LEFT iliac bone suspicious for osseous metastases.   01/23/2016 Imaging   CT CAP- 1. Similar widespread osseous metastasis. 2. Similar soft tissue thickening within the retroperitoneum of the abdomen. Improved left pelvic side wall soft tissue thickening. These are consistent with sites of treated disease. No well-defined adenopathy. 3. No new sites of disease.   05/03/2016 -  Chemotherapy   Jevtana every 21 days    05/03/2016 -  Chemotherapy   The patient had pegfilgrastim (NEULASTA) injection 6 mg, 6 mg, Subcutaneous,  Once, 1 of 1 cycle Administration: 6 mg (05/05/2016) pegfilgrastim (NEULASTA ONPRO KIT) injection 6 mg, 6 mg, Subcutaneous, Once, 43 of 46 cycles Administration: 6 mg (05/24/2016), 6 mg (06/14/2016), 6 mg (07/07/2016), 6 mg (07/28/2016), 6 mg (08/18/2016), 6 mg (09/08/2016), 6 mg (09/29/2016), 6 mg (10/20/2016), 6 mg (11/10/2016), 6 mg (02/14/2017), 6 mg (03/07/2017), 6 mg (03/28/2017), 6 mg (04/20/2017), 6 mg (05/11/2017), 6 mg (06/27/2017), 6 mg (08/30/2017), 6 mg (09/20/2017), 6 mg (10/11/2017), 6 mg (11/02/2017), 6 mg (08/09/2017), 6 mg (11/23/2017), 6 mg (12/14/2017), 6 mg (01/09/2018), 6 mg (01/31/2018), 6 mg (04/04/2018), 6 mg (04/27/2018), 6 mg (05/18/2018), 6 mg (06/08/2018), 6 mg (06/29/2018), 6 mg (07/20/2018), 6 mg (08/10/2018), 6 mg (09/01/2018), 6 mg (09/22/2018), 6 mg (10/13/2018), 6 mg (11/03/2018), 6 mg (11/24/2018), 6 mg (12/15/2018), 6 mg (01/05/2019), 6 mg (01/26/2019), 6 mg (02/16/2019), 6 mg (03/12/2019) cabazitaxel (JEVTANA) 41  mg in dextrose 5 % 250 mL chemo infusion, 20 mg/m2 = 41 mg (100 % of original dose 20 mg/m2), Intravenous,  Once, 44 of 47 cycles Dose modification: 20 mg/m2 (original dose 20 mg/m2, Cycle 1, Reason: Dose not tolerated, Comment: recommended by Data to use 31m/m), 20 mg/m2 (original dose 20 mg/m2, Cycle 37, Reason: Other  (see comments), Comment: Changed to new orderable), 21 mg/m2 (original dose 20 mg/m2, Cycle 40, Reason: Other (see comments)), 20 mg/m2 (original dose 20 mg/m2, Cycle 40, Reason: Other (see comments)) Administration: 41 mg (05/03/2016), 41 mg (05/24/2016), 41 mg (06/14/2016), 41 mg (07/07/2016), 41 mg (07/28/2016), 41 mg (08/18/2016), 41 mg (09/08/2016), 41 mg (09/29/2016), 41 mg (10/20/2016), 41 mg (11/10/2016), 41 mg (02/14/2017), 41 mg (03/07/2017), 41 mg (03/28/2017), 41 mg (04/20/2017), 41 mg (05/11/2017), 41 mg (06/27/2017), 41 mg (08/30/2017), 41 mg (09/20/2017), 41 mg (10/11/2017), 41 mg (11/02/2017), 41 mg (08/09/2017), 41 mg (11/23/2017), 41 mg (12/14/2017), 41 mg (01/09/2018), 41 mg (01/31/2018), 41 mg (02/21/2018), 41 mg (04/04/2018), 41 mg (04/27/2018), 41 mg (05/18/2018), 41 mg (06/08/2018), 41 mg (06/29/2018), 41 mg (07/20/2018), 41 mg (08/10/2018), 41 mg (09/01/2018), 41 mg (09/22/2018), 41 mg (10/13/2018), 41 mg (11/03/2018), 41 mg (11/24/2018), 41 mg (12/15/2018), 41 mg (01/05/2019), 41 mg (01/26/2019), 41 mg (02/16/2019), 41 mg (03/12/2019)  for chemotherapy treatment.    09/06/2016 Imaging   Restaging CT C/A/P: IMPRESSION: 1. Similar appearance of widespread sclerotic bone metastases. 2. No change in soft tissue thickening within the retroperitoneum and left pelvic sidewall. No well defined adenopathy identified. 3. No new sites of disease 4. Aortic Atherosclerosis (ICD10-I70.0). Coronary artery calcifications noted. 5. 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 6. Stable 9 mm pancreatic cystic lesion. Favor pseudocyst. Indolent neoplasm may look similar. Attention on follow-up imaging.   09/06/2016 Imaging   Bone Scan: IMPRESSION: In this patient with known diffuse sclerotic metastatic disease, bone scan does not reveal progression of radiotracer uptake. Several of the areas of previously demonstrated radiotracer uptake appear less prominent.  Change in  appearance of radiotracer uptake involving the mandible now more notable on the right and previously more notable on the left may reflect result of dental disease rather than metastatic disease.    04/08/2017 Tumor Marker   PSA 0.89     INTERVAL HISTORY:  [name]89y.o. male seen for follow-up and toxicity assessment prior to chemotherapy for metastatic prostate cancer.  His last cabazitaxel was 3 weeks ago.  Appetite is 600%.  Energy levels are 75%.  Denies taking any over-the-counter NSAIDs.  Denies any tingling or numbness in extremities.  Continues to have sleep problems.  No new pains reported.  He is taking calcium supplements but not vitamin D.  Use Compazine as needed for nausea.  Denies any fevers or chills or infections in the last 3 weeks.   REVIEW OF SYSTEMS:  Review of Systems  Psychiatric/Behavioral: Positive for sleep disturbance.  All other systems reviewed and are negative.    PAST MEDICAL/SURGICAL HISTORY:  Past Medical History:  Diagnosis Date  . Acute renal failure ([facility] 01/26/2014  . Alcohol abuse   . Anemia of chronic disease 01/26/2014  . Bilateral hydronephrosis 01/26/2014  . History of cocaine abuse ([facility] 01/26/2014  . Homelessness 01/31/2014  . Prostate cancer metastatic to multiple sites ([facility] 01/30/2014  . Sepsis ([facility]   . UTI (lower urinary tract infection)    Past Surgical History:  Procedure Laterality Date  . ORCHIECTOMY Bilateral 02/05/2014   Procedure: BILATERAL  ORCHIECTOMY;  Surgeon: Festus Aloe, MD;  Location: [facility];  Service: Urology;  Laterality: Bilateral;  . PERCUTANEOUS NEPHROSTOMY Bilateral    IR Dr. Junious Silk changed on 04/22/2014  . PORT-A-CATH REMOVAL Right 08/08/2017   Procedure: REMOVAL PORT-A-CATH RIGHT CHEST (PROCEDURE #2);  Surgeon: Aviva Signs, MD;  Location: [facility];  Service: General;  Laterality: Right;  . PORTACATH PLACEMENT Right 03/12/14  . PORTACATH PLACEMENT Left 08/08/2017   Procedure: INSERTION POWER PORT  WITH ATTACHED CATHETER LEFT SUBCLAVIAN (PROCEDURE #1);  Surgeon: Aviva Signs, MD;  Location: [facility];  Service: General;  Laterality: Left;     SOCIAL HISTORY:  Social History   Socioeconomic History  . Marital status: Single    Spouse name: Not on file  . Number of children: 1  . Years of education: 9th  . Highest education level: Not on file  Occupational History  . Occupation: disabilty    Employer: [employer]  . Financial resource strain: Not on file  . Food insecurity    Worry: Not on file    Inability: Not on file  . Transportation needs    Medical: Not on file    Non-medical: Not on file  Tobacco Use  . Smoking status: Current Every Day Smoker    Packs/day: 0.25    Years: 55.00    Pack years: 13.75    Types: Cigarettes  . Smokeless tobacco: Never Used  Substance and Sexual Activity  . Alcohol use: Yes    Alcohol/week: 1.0 standard drinks    Types: 1 Cans of beer per week  . Drug use: Yes    Types: Cocaine    Comment: last used Cocaine 01/25/14  . Sexual activity: Yes  Lifestyle  . Physical activity    Days per week: Not on file    Minutes per session: Not on file  . Stress: Not on file  Relationships  . Social Herbalist on phone: Not on file    Gets together: Not on file    Attends religious service: Not on file    Active member of club or organization: Not on file    Attends meetings of clubs or organizations: Not on file    Relationship status: Not on file  . Intimate partner violence    Fear of current or ex partner: Not on file    Emotionally abused: Not on file    Physically abused: Not on file    Forced sexual activity: Not on file  Other Topics Concern  . Not on file  Social History Narrative   Lives at [employer]   1 son, lives in [place], [place]   Can read and write in native language             FAMILY HISTORY:  Family History  Problem Relation Age of Onset  . Cancer  Brother 80    CURRENT MEDICATIONS:  Outpatient Encounter Medications as of 04/02/2019  Medication Sig Note  . calcium gluconate 500 MG tablet Take 1 tablet by mouth 2 (two) times daily.   . prochlorperazine (COMPAZINE) 10 MG tablet The day after chemo take 1 tab four times a day x 2 days. Then may take 1 tab four times a day if needed for nausea/vomiting. (Patient not taking: Reported on 03/12/2019) 07/24/2014: Just takes as needed   Facility-Administered Encounter Medications as of 04/02/2019  Medication  . cyanocobalamin ((VITAMIN B-12)) injection 1,000 mcg  .  denosumab (XGEVA) injection 120 mg    ALLERGIES:  No Known Allergies   PHYSICAL EXAM:  ECOG Performance status: 1  Vitals:   04/02/19 0915  BP: 118/74  Pulse: (!) 108  Resp: 18  Temp: (!) 96.9 F (36.1 C)  SpO2: 98%   Filed Weights   04/02/19 0915  Weight: 168 lb 6.4 oz (76.4 kg)    Physical Exam Constitutional:      Appearance: Normal appearance. He is normal weight.  Cardiovascular:     Rate and Rhythm: Normal rate and regular rhythm.     Heart sounds: Normal heart sounds.  Pulmonary:     Effort: Pulmonary effort is normal.     Breath sounds: Normal breath sounds.  Abdominal:     General: Bowel sounds are normal.     Palpations: Abdomen is soft.  Musculoskeletal: Normal range of motion.  Skin:    General: Skin is warm and dry.  Neurological:     Mental Status: He is alert and oriented to person, place, and time. Mental status is at baseline.  Psychiatric:        Mood and Affect: Mood normal.        Behavior: Behavior normal.        Thought Content: Thought content normal.        Judgment: Judgment normal.      LABORATORY DATA:  I have reviewed the labs as listed.  CBC    Component Value Date/Time   WBC 5.3 04/02/2019 0809   RBC 3.70 (L) 04/02/2019 0809   HGB 11.7 (L) 04/02/2019 0809   HCT 38.0 (L) 04/02/2019 0809   PLT 337 04/02/2019 0809   MCV 102.7 (H) 04/02/2019 0809   MCH 31.6  04/02/2019 0809   MCHC 30.8 04/02/2019 0809   RDW 16.0 (H) 04/02/2019 0809   LYMPHSABS 2.1 04/02/2019 0809   MONOABS 0.6 04/02/2019 0809   EOSABS 0.1 04/02/2019 0809   BASOSABS 0.0 04/02/2019 0809   CMP Latest Ref Rng & Units 04/02/2019 03/12/2019 02/16/2019  Glucose 70 - 99 mg/dL 89 100(H) 76  BUN 8 - 23 mg/dL '19 14 23  ' Creatinine 0.61 - 1.24 mg/dL 1.95(H) 1.56(H) 1.76(H)  Sodium 135 - 145 mmol/L 136 138 135  Potassium 3.5 - 5.1 mmol/L 4.5 4.2 5.0  Chloride 98 - 111 mmol/L 105 105 109  CO2 22 - 32 mmol/L 21(L) 24 20(L)  Calcium 8.9 - 10.3 mg/dL 8.9 8.1(L) 8.4(L)  Total Protein 6.5 - 8.1 g/dL 7.9 7.2 7.8  Total Bilirubin 0.3 - 1.2 mg/dL 0.2(L) 0.4 0.2(L)  Alkaline Phos 38 - 126 U/L 63 62 79  AST 15 - 41 U/L '31 24 22  ' ALT 0 - 44 U/L '23 24 19   ' I have reviewed scans and discussed with the patient.   ASSESSMENT & PLAN:   Malignant neoplasm of prostate ([place]) 1.  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 -Last bone scan on 07/25/2017 showed mixed response compared to prior scan from 09/06/2016.  Last CT scan on 12/20/2016 did not show any visceral metastasis. -His PSA is remaining between 0.7 and 0.9. -He is tolerating cabazitaxel very well.  I have reviewed his labs.  His creatinine increased to 1.94 today. -His last PSA was 0.74 on 03/12/2019.  He will proceed with his next treatment.  I will see him back in 6 weeks for follow-up.  2.  Bone metastasis: -  Denies any jaw pains.  He will continue denosumab.  Calcium is within normal limits.  3.  Normocytic anemia: -This is from combination of CKD and chemotherapy. -He received Feraheme on 02/16/2019 and 03/12/2019.  Hemoglobin today improved to 11.7.  His energy levels also improved.  4.  Vitamin D deficiency: -His vitamin D level on 03/12/2019 was 10.1. -I will send a prescription for vitamin D 50,000 units  weekly to his pharmacy.  I plan to repeat vitamin D level in 2 months.  5.  CKD: -His baseline creatinine is around 1.5-1.7. -His creatinine increased to 1.94 today.  We will give him 400 mL of normal saline.     Orders placed this encounter:  Orders Placed This Encounter  Procedures  . CBC with Differential/Platelet  . Comprehensive metabolic panel  . Magnesium  . CBC with Differential/Platelet  . Comprehensive metabolic panel  . Magnesium      Francene Finders, FNP-C [place] [phone]

## 2019-04-03 MED ORDER — OCTREOTIDE ACETATE 30 MG IM KIT
PACK | INTRAMUSCULAR | Status: AC
  Filled 2019-04-03: qty 1

## 2019-04-03 MED ORDER — FULVESTRANT 250 MG/5ML IM SOLN
INTRAMUSCULAR | Status: AC
  Filled 2019-04-03: qty 5

## 2019-04-13 ENCOUNTER — Other Ambulatory Visit (HOSPITAL_COMMUNITY): Payer: Medicare Other

## 2019-04-13 ENCOUNTER — Ambulatory Visit (HOSPITAL_COMMUNITY): Payer: Medicare Other

## 2019-04-23 ENCOUNTER — Encounter (HOSPITAL_COMMUNITY): Payer: Self-pay

## 2019-04-23 ENCOUNTER — Inpatient Hospital Stay (HOSPITAL_COMMUNITY): Payer: Medicare Other

## 2019-04-23 ENCOUNTER — Other Ambulatory Visit: Payer: Self-pay

## 2019-04-23 VITALS — BP 109/75 | HR 78 | Temp 97.9°F | Resp 16 | Wt 169.8 lb

## 2019-04-23 DIAGNOSIS — C7951 Secondary malignant neoplasm of bone: Secondary | ICD-10-CM | POA: Diagnosis not present

## 2019-04-23 DIAGNOSIS — C61 Malignant neoplasm of prostate: Secondary | ICD-10-CM

## 2019-04-23 DIAGNOSIS — M899 Disorder of bone, unspecified: Secondary | ICD-10-CM

## 2019-04-23 DIAGNOSIS — Z5111 Encounter for antineoplastic chemotherapy: Secondary | ICD-10-CM | POA: Diagnosis not present

## 2019-04-23 DIAGNOSIS — E538 Deficiency of other specified B group vitamins: Secondary | ICD-10-CM

## 2019-04-23 DIAGNOSIS — E559 Vitamin D deficiency, unspecified: Secondary | ICD-10-CM | POA: Diagnosis not present

## 2019-04-23 DIAGNOSIS — D631 Anemia in chronic kidney disease: Secondary | ICD-10-CM | POA: Diagnosis not present

## 2019-04-23 DIAGNOSIS — N189 Chronic kidney disease, unspecified: Secondary | ICD-10-CM | POA: Diagnosis not present

## 2019-04-23 DIAGNOSIS — Z5189 Encounter for other specified aftercare: Secondary | ICD-10-CM | POA: Diagnosis not present

## 2019-04-23 LAB — CBC WITH DIFFERENTIAL/PLATELET
Abs Immature Granulocytes: 0.01 10*3/uL (ref 0.00–0.07)
Basophils Absolute: 0 10*3/uL (ref 0.0–0.1)
Basophils Relative: 0 %
Eosinophils Absolute: 0 10*3/uL (ref 0.0–0.5)
Eosinophils Relative: 1 %
HCT: 36 % — ABNORMAL LOW (ref 39.0–52.0)
Hemoglobin: 11.4 g/dL — ABNORMAL LOW (ref 13.0–17.0)
Immature Granulocytes: 0 %
Lymphocytes Relative: 26 %
Lymphs Abs: 1.6 10*3/uL (ref 0.7–4.0)
MCH: 31.5 pg (ref 26.0–34.0)
MCHC: 31.7 g/dL (ref 30.0–36.0)
MCV: 99.4 fL (ref 80.0–100.0)
Monocytes Absolute: 0.6 10*3/uL (ref 0.1–1.0)
Monocytes Relative: 10 %
Neutro Abs: 3.9 10*3/uL (ref 1.7–7.7)
Neutrophils Relative %: 63 %
Platelets: 311 10*3/uL (ref 150–400)
RBC: 3.62 MIL/uL — ABNORMAL LOW (ref 4.22–5.81)
RDW: 15.5 % (ref 11.5–15.5)
WBC: 6.2 10*3/uL (ref 4.0–10.5)
nRBC: 0 % (ref 0.0–0.2)

## 2019-04-23 LAB — COMPREHENSIVE METABOLIC PANEL
ALT: 28 U/L (ref 0–44)
AST: 30 U/L (ref 15–41)
Albumin: 3.8 g/dL (ref 3.5–5.0)
Alkaline Phosphatase: 49 U/L (ref 38–126)
Anion gap: 11 (ref 5–15)
BUN: 32 mg/dL — ABNORMAL HIGH (ref 8–23)
CO2: 21 mmol/L — ABNORMAL LOW (ref 22–32)
Calcium: 8.7 mg/dL — ABNORMAL LOW (ref 8.9–10.3)
Chloride: 105 mmol/L (ref 98–111)
Creatinine, Ser: 2.1 mg/dL — ABNORMAL HIGH (ref 0.61–1.24)
GFR calc Af Amer: 35 mL/min — ABNORMAL LOW (ref >60)
GFR calc non Af Amer: 31 mL/min — ABNORMAL LOW (ref >60)
Glucose, Bld: 98 mg/dL (ref 70–99)
Potassium: 4.5 mmol/L (ref 3.5–5.1)
Sodium: 137 mmol/L (ref 135–145)
Total Bilirubin: 0.2 mg/dL — ABNORMAL LOW (ref 0.3–1.2)
Total Protein: 7.8 g/dL (ref 6.5–8.1)

## 2019-04-23 LAB — MAGNESIUM: Magnesium: 2 mg/dL (ref 1.7–2.4)

## 2019-04-23 MED ORDER — FULVESTRANT 250 MG/5ML IM SOLN
INTRAMUSCULAR | Status: AC
  Filled 2019-04-23: qty 5

## 2019-04-23 MED ORDER — SODIUM CHLORIDE 0.9% FLUSH
10.0000 mL | INTRAVENOUS | Status: DC | PRN
  Administered 2019-04-23: 10 mL

## 2019-04-23 MED ORDER — HEPARIN SOD (PORK) LOCK FLUSH 100 UNIT/ML IV SOLN
500.0000 [IU] | Freq: Once | INTRAVENOUS | Status: AC | PRN
  Administered 2019-04-23: 500 [IU]

## 2019-04-23 MED ORDER — FAMOTIDINE IN NACL 20-0.9 MG/50ML-% IV SOLN
20.0000 mg | Freq: Once | INTRAVENOUS | Status: AC
  Administered 2019-04-23: 10:00:00 20 mg via INTRAVENOUS
  Filled 2019-04-23: qty 50

## 2019-04-23 MED ORDER — DENOSUMAB 120 MG/1.7ML ~~LOC~~ SOLN
120.0000 mg | Freq: Once | SUBCUTANEOUS | Status: AC
  Administered 2019-04-23: 120 mg via SUBCUTANEOUS
  Filled 2019-04-23: qty 1.7

## 2019-04-23 MED ORDER — DIPHENHYDRAMINE HCL 50 MG/ML IJ SOLN
25.0000 mg | Freq: Once | INTRAMUSCULAR | Status: AC
  Administered 2019-04-23: 25 mg via INTRAVENOUS
  Filled 2019-04-23: qty 1

## 2019-04-23 MED ORDER — SODIUM CHLORIDE 0.9 % IV SOLN
10.0000 mg | Freq: Once | INTRAVENOUS | Status: AC
  Administered 2019-04-23: 10:00:00 10 mg via INTRAVENOUS
  Filled 2019-04-23: qty 10

## 2019-04-23 MED ORDER — OCTREOTIDE ACETATE 30 MG IM KIT
PACK | INTRAMUSCULAR | Status: AC
  Filled 2019-04-23: qty 1

## 2019-04-23 MED ORDER — SODIUM CHLORIDE 0.9 % IV SOLN: Freq: Once | INTRAVENOUS | Status: AC

## 2019-04-23 MED ORDER — CYANOCOBALAMIN 1000 MCG/ML IJ SOLN
1000.0000 ug | Freq: Once | INTRAMUSCULAR | Status: AC
  Administered 2019-04-23: 1000 ug via INTRAMUSCULAR
  Filled 2019-04-23: qty 1

## 2019-04-23 MED ORDER — SODIUM CHLORIDE 0.9 % IV SOLN
20.0000 mg/m2 | Freq: Once | INTRAVENOUS | Status: AC
  Administered 2019-04-23: 41 mg via INTRAVENOUS
  Filled 2019-04-23: qty 4.1

## 2019-04-23 MED ORDER — PEGFILGRASTIM 6 MG/0.6ML ~~LOC~~ PSKT
6.0000 mg | PREFILLED_SYRINGE | Freq: Once | SUBCUTANEOUS | Status: AC
  Administered 2019-04-23: 6 mg via SUBCUTANEOUS
  Filled 2019-04-23: qty 0.6

## 2019-04-23 MED ORDER — SODIUM CHLORIDE 0.9 % IV SOLN: INTRAVENOUS | Status: DC

## 2019-04-23 NOTE — Progress Notes (Signed)
04/23/19  Ok for Delton See today   T.O. [name], NP-C/Aryianna Earwood Ronnald Ramp, PharmD

## 2019-04-23 NOTE — Progress Notes (Signed)
Labs reviewed with [name] NP today. Will treat and give additional hydration today and tomorrow due to kidney function. BUN 32,Creatinine 2.10.  xgeva and b12 given today per orders.   [name] KitchenAdriana [name] arrived today for [facility] neulasta on body injector. See MAR for administration details. Injector in place and engaged with green light indicator on flashing. Tolerated application with out problems.  Treatment given per orders. Patient tolerated it well without problems. Vitals stable and discharged home from clinic ambulatory. Follow up as scheduled.

## 2019-04-23 NOTE — Patient Instructions (Signed)
[facility] Discharge Instructions for Patients Receiving Chemotherapy  Today you received the following chemotherapy agents   To help prevent nausea and vomiting after your treatment, we encourage you to take your nausea medication   If you develop nausea and vomiting that is not controlled by your nausea medication, call the clinic.   BELOW ARE SYMPTOMS THAT SHOULD BE REPORTED IMMEDIATELY:  *FEVER GREATER THAN 100.5 F  *CHILLS WITH OR WITHOUT FEVER  NAUSEA AND VOMITING THAT IS NOT CONTROLLED WITH YOUR NAUSEA MEDICATION  *UNUSUAL SHORTNESS OF BREATH  *UNUSUAL BRUISING OR BLEEDING  TENDERNESS IN MOUTH AND THROAT WITH OR WITHOUT PRESENCE OF ULCERS  *URINARY PROBLEMS  *BOWEL PROBLEMS  UNUSUAL RASH Items with * indicate a potential emergency and should be followed up as soon as possible.  Feel free to call the clinic should you have any questions or concerns. The clinic phone number is [phone].  Please show the CHEMO ALERT CARD at check-in to the Emergency Department and triage nurse.   

## 2019-04-24 ENCOUNTER — Inpatient Hospital Stay (HOSPITAL_COMMUNITY): Payer: Medicare Other

## 2019-04-24 DIAGNOSIS — D631 Anemia in chronic kidney disease: Secondary | ICD-10-CM | POA: Diagnosis not present

## 2019-04-24 DIAGNOSIS — Z5111 Encounter for antineoplastic chemotherapy: Secondary | ICD-10-CM | POA: Diagnosis not present

## 2019-04-24 DIAGNOSIS — C7951 Secondary malignant neoplasm of bone: Secondary | ICD-10-CM | POA: Diagnosis not present

## 2019-04-24 DIAGNOSIS — E559 Vitamin D deficiency, unspecified: Secondary | ICD-10-CM | POA: Diagnosis not present

## 2019-04-24 DIAGNOSIS — Z5189 Encounter for other specified aftercare: Secondary | ICD-10-CM | POA: Diagnosis not present

## 2019-04-24 DIAGNOSIS — N189 Chronic kidney disease, unspecified: Secondary | ICD-10-CM | POA: Diagnosis not present

## 2019-04-24 MED ORDER — SODIUM CHLORIDE 0.9 % IV SOLN: INTRAVENOUS | Status: DC

## 2019-04-24 MED ORDER — SODIUM CHLORIDE 0.9% FLUSH
10.0000 mL | Freq: Once | INTRAVENOUS | Status: AC
  Administered 2019-04-24: 10 mL via INTRAVENOUS

## 2019-04-24 MED ORDER — HEPARIN SOD (PORK) LOCK FLUSH 100 UNIT/ML IV SOLN
500.0000 [IU] | Freq: Once | INTRAVENOUS | Status: AC
  Administered 2019-04-24: 500 [IU] via INTRAVENOUS

## 2019-04-24 NOTE — Progress Notes (Signed)
Patient here for hydration fluids today. Patient tolerated it well without problems. Vitals stable and discharged home from clinic ambulatory. Follow up as scheduled.

## 2019-04-30 ENCOUNTER — Other Ambulatory Visit (HOSPITAL_COMMUNITY): Payer: Medicare Other

## 2019-04-30 ENCOUNTER — Ambulatory Visit (HOSPITAL_COMMUNITY): Payer: Medicare Other

## 2019-04-30 ENCOUNTER — Ambulatory Visit (HOSPITAL_COMMUNITY): Payer: Medicare Other | Admitting: Hematology

## 2019-05-01 ENCOUNTER — Encounter (HOSPITAL_COMMUNITY): Payer: Self-pay | Admitting: General Practice

## 2019-05-01 NOTE — Progress Notes (Signed)
[facility] CSW Progress Notes  Enrolled in [employer] Program.  Will receive 7 meals/week delivered by [employer] for next 6 months.  Dennard Vezina, LCSW Clinical Social Worker Phone:  [phone] Cell:  [phone] 

## 2019-05-14 ENCOUNTER — Encounter (HOSPITAL_COMMUNITY): Payer: Self-pay | Admitting: Hematology

## 2019-05-14 ENCOUNTER — Inpatient Hospital Stay (HOSPITAL_BASED_OUTPATIENT_CLINIC_OR_DEPARTMENT_OTHER): Payer: Medicare HMO | Admitting: Hematology

## 2019-05-14 ENCOUNTER — Inpatient Hospital Stay (HOSPITAL_COMMUNITY): Payer: Medicare HMO | Attending: Hematology

## 2019-05-14 ENCOUNTER — Inpatient Hospital Stay (HOSPITAL_COMMUNITY): Payer: Medicare HMO

## 2019-05-14 ENCOUNTER — Other Ambulatory Visit: Payer: Self-pay

## 2019-05-14 VITALS — BP 115/73 | HR 66 | Temp 96.9°F | Resp 18

## 2019-05-14 VITALS — BP 108/71 | HR 73 | Temp 97.1°F | Resp 18 | Wt 174.2 lb

## 2019-05-14 DIAGNOSIS — D631 Anemia in chronic kidney disease: Secondary | ICD-10-CM | POA: Diagnosis not present

## 2019-05-14 DIAGNOSIS — Z5189 Encounter for other specified aftercare: Secondary | ICD-10-CM | POA: Diagnosis not present

## 2019-05-14 DIAGNOSIS — E559 Vitamin D deficiency, unspecified: Secondary | ICD-10-CM | POA: Diagnosis not present

## 2019-05-14 DIAGNOSIS — C61 Malignant neoplasm of prostate: Secondary | ICD-10-CM | POA: Diagnosis not present

## 2019-05-14 DIAGNOSIS — N189 Chronic kidney disease, unspecified: Secondary | ICD-10-CM | POA: Insufficient documentation

## 2019-05-14 DIAGNOSIS — Z5111 Encounter for antineoplastic chemotherapy: Secondary | ICD-10-CM | POA: Diagnosis not present

## 2019-05-14 DIAGNOSIS — C7951 Secondary malignant neoplasm of bone: Secondary | ICD-10-CM | POA: Insufficient documentation

## 2019-05-14 LAB — COMPREHENSIVE METABOLIC PANEL
ALT: 35 U/L (ref 0–44)
AST: 38 U/L (ref 15–41)
Albumin: 3.8 g/dL (ref 3.5–5.0)
Alkaline Phosphatase: 60 U/L (ref 38–126)
Anion gap: 7 (ref 5–15)
BUN: 18 mg/dL (ref 8–23)
CO2: 23 mmol/L (ref 22–32)
Calcium: 8.9 mg/dL (ref 8.9–10.3)
Chloride: 106 mmol/L (ref 98–111)
Creatinine, Ser: 1.62 mg/dL — ABNORMAL HIGH (ref 0.61–1.24)
GFR calc Af Amer: 48 mL/min — ABNORMAL LOW (ref >60)
GFR calc non Af Amer: 42 mL/min — ABNORMAL LOW (ref >60)
Glucose, Bld: 97 mg/dL (ref 70–99)
Potassium: 4.3 mmol/L (ref 3.5–5.1)
Sodium: 136 mmol/L (ref 135–145)
Total Bilirubin: 0.2 mg/dL — ABNORMAL LOW (ref 0.3–1.2)
Total Protein: 7.6 g/dL (ref 6.5–8.1)

## 2019-05-14 LAB — CBC WITH DIFFERENTIAL/PLATELET
Abs Immature Granulocytes: 0.02 10*3/uL (ref 0.00–0.07)
Basophils Absolute: 0 10*3/uL (ref 0.0–0.1)
Basophils Relative: 0 %
Eosinophils Absolute: 0.1 10*3/uL (ref 0.0–0.5)
Eosinophils Relative: 1 %
HCT: 36.3 % — ABNORMAL LOW (ref 39.0–52.0)
Hemoglobin: 11.5 g/dL — ABNORMAL LOW (ref 13.0–17.0)
Immature Granulocytes: 0 %
Lymphocytes Relative: 28 %
Lymphs Abs: 1.6 10*3/uL (ref 0.7–4.0)
MCH: 31.7 pg (ref 26.0–34.0)
MCHC: 31.7 g/dL (ref 30.0–36.0)
MCV: 100 fL (ref 80.0–100.0)
Monocytes Absolute: 0.8 10*3/uL (ref 0.1–1.0)
Monocytes Relative: 14 %
Neutro Abs: 3.2 10*3/uL (ref 1.7–7.7)
Neutrophils Relative %: 57 %
Platelets: 307 10*3/uL (ref 150–400)
RBC: 3.63 MIL/uL — ABNORMAL LOW (ref 4.22–5.81)
RDW: 15.3 % (ref 11.5–15.5)
WBC: 5.7 10*3/uL (ref 4.0–10.5)
nRBC: 0 % (ref 0.0–0.2)

## 2019-05-14 LAB — MAGNESIUM: Magnesium: 1.8 mg/dL (ref 1.7–2.4)

## 2019-05-14 MED ORDER — HEPARIN SOD (PORK) LOCK FLUSH 100 UNIT/ML IV SOLN
500.0000 [IU] | Freq: Once | INTRAVENOUS | Status: AC | PRN
  Administered 2019-05-14: 14:00:00 500 [IU]

## 2019-05-14 MED ORDER — DIPHENHYDRAMINE HCL 50 MG/ML IJ SOLN
25.0000 mg | Freq: Once | INTRAMUSCULAR | Status: AC
  Administered 2019-05-14: 25 mg via INTRAVENOUS
  Filled 2019-05-14: qty 1

## 2019-05-14 MED ORDER — PEGFILGRASTIM 6 MG/0.6ML ~~LOC~~ PSKT
6.0000 mg | PREFILLED_SYRINGE | Freq: Once | SUBCUTANEOUS | Status: AC
  Administered 2019-05-14: 14:00:00 6 mg via SUBCUTANEOUS
  Filled 2019-05-14: qty 0.6

## 2019-05-14 MED ORDER — SODIUM CHLORIDE 0.9 % IV SOLN
10.0000 mg | Freq: Once | INTRAVENOUS | Status: AC
  Administered 2019-05-14: 11:00:00 10 mg via INTRAVENOUS
  Filled 2019-05-14: qty 10

## 2019-05-14 MED ORDER — SODIUM CHLORIDE 0.9 % IV SOLN: Freq: Once | INTRAVENOUS | Status: AC

## 2019-05-14 MED ORDER — FAMOTIDINE IN NACL 20-0.9 MG/50ML-% IV SOLN
20.0000 mg | Freq: Once | INTRAVENOUS | Status: AC
  Administered 2019-05-14: 11:00:00 20 mg via INTRAVENOUS
  Filled 2019-05-14: qty 50

## 2019-05-14 MED ORDER — OCTREOTIDE ACETATE 20 MG IM KIT
PACK | INTRAMUSCULAR | Status: AC
  Filled 2019-05-14: qty 1

## 2019-05-14 MED ORDER — SODIUM CHLORIDE 0.9% FLUSH
10.0000 mL | INTRAVENOUS | Status: DC | PRN
  Administered 2019-05-14: 10 mL

## 2019-05-14 MED ORDER — SODIUM CHLORIDE 0.9 % IV SOLN
20.0000 mg/m2 | Freq: Once | INTRAVENOUS | Status: AC
  Administered 2019-05-14: 12:00:00 41 mg via INTRAVENOUS
  Filled 2019-05-14: qty 4.1

## 2019-05-14 NOTE — Assessment & Plan Note (Signed)
1.  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 -Last bone scan on 07/25/2017 showed mixed response compared to prior scan from 09/06/2016.  Last CT scan on 12/20/2016 did not show any visceral metastasis. -For a time his PSA was between 0.7 and 0.9.  However his last reading went up to 1.03. -Level from today is pending.  If it continues to go up, will consider repeating scans. -He denies any signs or symptoms of peripheral neuropathy.  We will proceed with cabazitaxel today.  I have reviewed his labs.  2.  Bone metastasis: -He is continuing denosumab.  Denies any jaw pains.  3.  Normocytic anemia: -It is from CKD and chemotherapy. -Last Feraheme was on 03/12/2019.  Hemoglobin today is 11.5.  4.  Vitamin D deficiency: -He will continue vitamin D 50,000 units weekly.  5.  CKD: -His creatinine is better at 1.6 today.

## 2019-05-14 NOTE — Progress Notes (Signed)
Patient has been assessed, vital signs and labs have been reviewed by Dr. Katragadda. ANC, Creatinine, LFTs, and Platelets are within treatment parameters per Dr. Katragadda. The patient is good to proceed with treatment at this time.  

## 2019-05-14 NOTE — Patient Instructions (Addendum)
[facility] at [facility] Discharge Instructions  You were seen today by Dr. Katragadda. He went over your recent lab results. He will see you back in 3 weeks for labs, treatment and follow up.   Thank you for choosing [facility] at [facility] to provide your oncology and hematology care.  To afford each patient quality time with our provider, please arrive at least 15 minutes before your scheduled appointment time.   If you have a lab appointment with the Cancer Center please come in thru the  Main Entrance and check in at the main information desk  You need to re-schedule your appointment should you arrive 10 or more minutes late.  We strive to give you quality time with our providers, and arriving late affects you and other patients whose appointments are after yours.  Also, if you no show three or more times for appointments you may be dismissed from the clinic at the providers discretion.     Again, thank you for choosing [facility].  Our hope is that these requests will decrease the amount of time that you wait before being seen by our physicians.       _____________________________________________________________  Should you have questions after your visit to [facility], please contact our office at [phone] between the hours of 8:00 a.m. and 4:30 p.m.  Voicemails left after 4:00 p.m. will not be returned until the following business day.  For prescription refill requests, have your pharmacy contact our office and allow 72 hours.    Cancer Center Support Programs:   > Cancer Support Group  2nd Tuesday of the month 1pm-2pm, Journey Room    

## 2019-05-14 NOTE — Progress Notes (Signed)
[address], [place] [address]   CLINIC:  Medical Oncology/Hematology  PCP:  Holley Bouche, NP (Inactive) [place] [address] [phone]   REASON FOR VISIT: Follow-up for prostate cancer  CURRENT THERAPY:  Cabazitaxel every 3 weeks   BRIEF ONCOLOGIC HISTORY:  Oncology History  Malignant neoplasm of prostate ([place])  01/26/2014 Tumor Marker   PSA > 5000   01/28/2014 Initial Biopsy   Metastatic adenocarcinoma of prostate   02/05/2014 Surgery   Bilateral orchiectomy by Dr. Junious Silk   02/26/2014 Tumor Marker   PSA = 399.5   03/14/2014 Tumor Marker   PSA= 89.51   03/14/2014 - 06/26/2014 Chemotherapy   Docetaxel 75 mg/kg every 21 days x 6 cycles   04/24/2014 Tumor Marker   PSA- 19.89   07/23/2014 Procedure   Nephrostomy tube removed by IR, Dr. Geroge Baseman   07/24/2014 Tumor Marker   PSA= 6.15   10/16/2014 Tumor Marker   PSA: 3.54    01/08/2015 Tumor Marker   PSA: 2.13    01/17/2015 Imaging   CT CAP-  Massive pelvic and retroperitoneal lymphadenopathy noted on the prior study has nearly completely resolved, now with only a small amount of residual amorphous soft tissue predominantly around the the infrarenal abdominal aorta.   01/17/2015 Imaging   Bone scan- Widespread osseous metastatic disease with multiple foci of increased activity throughout the skeleton, corresponding with the findings on the prior CT from October, 2015.   04/11/2015 Tumor Marker   PSA: 1.87    07/21/2015 Imaging   Bone scan- Bony metastatic disease again noted at multiple sites, stable from prior study. No progression of bony metastatic disease is demonstrable on this study.   07/25/2015 Imaging   CT CAP- Stable matted soft tissue density in the retroperitoneum and extraperitoneal pelvis consistent with treated disease. No recurrent lymphadenopathy.  No pulmonary metastatic disease. Diffuse stable sclerotic metastatic bone disease.     01/14/2016 Imaging   Bone scan-  Multiple sites of abnormal increased tracer localization involving BILATERAL ribs, T11, T12, questionably RIGHT scapula and LEFT iliac bone suspicious for osseous metastases.   01/23/2016 Imaging   CT CAP- 1. Similar widespread osseous metastasis. 2. Similar soft tissue thickening within the retroperitoneum of the abdomen. Improved left pelvic side wall soft tissue thickening. These are consistent with sites of treated disease. No well-defined adenopathy. 3. No new sites of disease.   05/03/2016 -  Chemotherapy   Jevtana every 21 days    05/03/2016 -  Chemotherapy   The patient had pegfilgrastim (NEULASTA) injection 6 mg, 6 mg, Subcutaneous,  Once, 1 of 1 cycle Administration: 6 mg (05/05/2016) pegfilgrastim (NEULASTA ONPRO KIT) injection 6 mg, 6 mg, Subcutaneous, Once, 46 of 49 cycles Administration: 6 mg (05/24/2016), 6 mg (06/14/2016), 6 mg (07/07/2016), 6 mg (07/28/2016), 6 mg (08/18/2016), 6 mg (09/08/2016), 6 mg (09/29/2016), 6 mg (10/20/2016), 6 mg (11/10/2016), 6 mg (02/14/2017), 6 mg (03/07/2017), 6 mg (03/28/2017), 6 mg (04/20/2017), 6 mg (05/11/2017), 6 mg (06/27/2017), 6 mg (08/30/2017), 6 mg (09/20/2017), 6 mg (10/11/2017), 6 mg (11/02/2017), 6 mg (08/09/2017), 6 mg (11/23/2017), 6 mg (12/14/2017), 6 mg (01/09/2018), 6 mg (01/31/2018), 6 mg (04/04/2018), 6 mg (04/27/2018), 6 mg (05/18/2018), 6 mg (06/08/2018), 6 mg (06/29/2018), 6 mg (07/20/2018), 6 mg (08/10/2018), 6 mg (09/01/2018), 6 mg (09/22/2018), 6 mg (10/13/2018), 6 mg (11/03/2018), 6 mg (11/24/2018), 6 mg (12/15/2018), 6 mg (01/05/2019), 6 mg (01/26/2019), 6 mg (02/16/2019), 6 mg (03/12/2019), 6 mg (  04/02/2019), 6 mg (04/23/2019), 6 mg (05/14/2019) cabazitaxel (JEVTANA) 41 mg in dextrose 5 % 250 mL chemo infusion, 20 mg/m2 = 41 mg (100 % of original dose 20 mg/m2), Intravenous,  Once, 47 of 50 cycles Dose modification: 20 mg/m2 (original dose 20 mg/m2, Cycle 1, Reason: Dose not tolerated, Comment: recommended by Data to use 15m/m), 20  mg/m2 (original dose 20 mg/m2, Cycle 37, Reason: Other (see comments), Comment: Changed to new orderable), 21 mg/m2 (original dose 20 mg/m2, Cycle 40, Reason: Other (see comments)), 20 mg/m2 (original dose 20 mg/m2, Cycle 40, Reason: Other (see comments)) Administration: 41 mg (05/03/2016), 41 mg (05/24/2016), 41 mg (06/14/2016), 41 mg (07/07/2016), 41 mg (07/28/2016), 41 mg (08/18/2016), 41 mg (09/08/2016), 41 mg (09/29/2016), 41 mg (10/20/2016), 41 mg (11/10/2016), 41 mg (02/14/2017), 41 mg (03/07/2017), 41 mg (03/28/2017), 41 mg (04/20/2017), 41 mg (05/11/2017), 41 mg (06/27/2017), 41 mg (08/30/2017), 41 mg (09/20/2017), 41 mg (10/11/2017), 41 mg (11/02/2017), 41 mg (08/09/2017), 41 mg (11/23/2017), 41 mg (12/14/2017), 41 mg (01/09/2018), 41 mg (01/31/2018), 41 mg (02/21/2018), 41 mg (04/04/2018), 41 mg (04/27/2018), 41 mg (05/18/2018), 41 mg (06/08/2018), 41 mg (06/29/2018), 41 mg (07/20/2018), 41 mg (08/10/2018), 41 mg (09/01/2018), 41 mg (09/22/2018), 41 mg (10/13/2018), 41 mg (11/03/2018), 41 mg (11/24/2018), 41 mg (12/15/2018), 41 mg (01/05/2019), 41 mg (01/26/2019), 41 mg (02/16/2019), 41 mg (03/12/2019), 41 mg (04/02/2019), 41 mg (04/23/2019), 41 mg (05/14/2019)  for chemotherapy treatment.    09/06/2016 Imaging   Restaging CT C/A/P: IMPRESSION: 1. Similar appearance of widespread sclerotic bone metastases. 2. No change in soft tissue thickening within the retroperitoneum and left pelvic sidewall. No well defined adenopathy identified. 3. No new sites of disease 4. Aortic Atherosclerosis (ICD10-I70.0). Coronary artery calcifications noted. 5. 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 6. Stable 9 mm pancreatic cystic lesion. Favor pseudocyst. Indolent neoplasm may look similar. Attention on follow-up imaging.   09/06/2016 Imaging   Bone Scan: IMPRESSION: In this patient with known diffuse sclerotic metastatic disease, bone scan does not reveal progression of radiotracer  uptake. Several of the areas of previously demonstrated radiotracer uptake appear less prominent.  Change in appearance of radiotracer uptake involving the mandible now more notable on the right and previously more notable on the left may reflect result of dental disease rather than metastatic disease.    04/08/2017 Tumor Marker   PSA 0.89     INTERVAL HISTORY:  [name]89y.o. male seen for follow-up and toxicity assessment prior to chemotherapy for metastatic prostate cancer.  Denies any tingling or numbness extremities.  Appetite is 100%.  Energy levels are 75%.  Denies any nausea vomiting or diarrhea.  REVIEW OF SYSTEMS:  Review of Systems  All other systems reviewed and are negative.    PAST MEDICAL/SURGICAL HISTORY:  Past Medical History:  Diagnosis Date  . [place] ([place] 01/26/2014  . Alcohol abuse   . Anemia of chronic disease 01/26/2014  . Bilateral hydronephrosis 01/26/2014  . History of cocaine abuse ([place] 01/26/2014  . Homelessness 01/31/2014  . Prostate cancer metastatic to multiple sites ([facility] 01/30/2014  . Sepsis ([place]   . UTI (lower urinary tract infection)    Past Surgical History:  Procedure Laterality Date  . ORCHIECTOMY Bilateral 02/05/2014   Procedure: BILATERAL ORCHIECTOMY;  Surgeon: MFestus Aloe MD;  Location: [facility];  Service: Urology;  Laterality: Bilateral;  . PERCUTANEOUS NEPHROSTOMY Bilateral    IR Dr. EJunious Silkchanged on 04/22/2014  . PORT-A-CATH REMOVAL Right 08/08/2017  Procedure: REMOVAL PORT-A-CATH RIGHT CHEST (PROCEDURE #2);  Surgeon: Aviva Signs, MD;  Location: [facility];  Service: General;  Laterality: Right;  . PORTACATH PLACEMENT Right 03/12/14  . PORTACATH PLACEMENT Left 08/08/2017   Procedure: INSERTION POWER PORT WITH ATTACHED CATHETER LEFT SUBCLAVIAN (PROCEDURE #1);  Surgeon: Aviva Signs, MD;  Location: [facility];  Service: General;  Laterality: Left;     SOCIAL HISTORY:  Social History   Socioeconomic  History  . Marital status: Single    Spouse name: Not on file  . Number of children: 1  . Years of education: 9th  . Highest education level: Not on file  Occupational History  . Occupation: disabilty    Employer: [employer]  Tobacco Use  . Smoking status: Current Every Day Smoker    Packs/day: 0.25    Years: 55.00    Pack years: 13.75    Types: Cigarettes  . Smokeless tobacco: Never Used  Substance and Sexual Activity  . Alcohol use: Yes    Alcohol/week: 1.0 standard drinks    Types: 1 Cans of beer per week  . Drug use: Yes    Types: Cocaine    Comment: last used Cocaine 01/25/14  . Sexual activity: Yes  Other Topics Concern  . Not on file  Social History Narrative   Lives at [employer] 9[employer] grade   1 son, lives in [place], [place]   Can read and write in native language            Social Determinants of Health   Financial Resource Strain:   . Difficulty of Paying Living Expenses: Not on file  Food Insecurity:   . Worried About Charity fundraiser in the Last Year: Not on file  . Ran Out of Food in the Last Year: Not on file  Transportation Needs:   . Lack of Transportation (Medical): Not on file  . Lack of Transportation (Non-Medical): Not on file  Physical Activity:   . Days of Exercise per Week: Not on file  . Minutes of Exercise per Session: Not on file  Stress:   . Feeling of Stress : Not on file  Social Connections:   . Frequency of Communication with Friends and Family: Not on file  . Frequency of Social Gatherings with Friends and Family: Not on file  . Attends Religious Services: Not on file  . Active Member of Clubs or Organizations: Not on file  . Attends Archivist Meetings: Not on file  . Marital Status: Not on file  Intimate Partner Violence:   . Fear of Current or Ex-Partner: Not on file  . Emotionally Abused: Not on file  . Physically Abused: Not on file  . Sexually Abused: Not on file    FAMILY HISTORY:    Family History  Problem Relation Age of Onset  . Cancer Brother 25    CURRENT MEDICATIONS:  Outpatient Encounter Medications as of 05/14/2019  Medication Sig Note  . calcium gluconate 500 MG tablet Take 1 tablet by mouth 2 (two) times daily.   . prochlorperazine (COMPAZINE) 10 MG tablet The day after chemo take 1 tab four times a day x 2 days. Then may take 1 tab four times a day if needed for nausea/vomiting. (Patient not taking: Reported on 03/12/2019) 07/24/2014: Just takes as needed  . [DISCONTINUED] ergocalciferol (VITAMIN D2) 1.25 MG (50000 UT) capsule Take 1 capsule (50,000 Units total) by mouth once a week.    Facility-Administered Encounter  Medications as of 05/14/2019  Medication  . cyanocobalamin ((VITAMIN B-12)) injection 1,000 mcg  . denosumab (XGEVA) injection 120 mg    ALLERGIES:  No Known Allergies   PHYSICAL EXAM:  ECOG Performance status: 1  Vitals:   05/14/19 0930  BP: 108/71  Pulse: 73  Resp: 18  Temp: (!) 97.1 F (36.2 C)  SpO2: 99%   Filed Weights   05/14/19 0930  Weight: 174 lb 3.2 oz (79 kg)    Physical Exam Constitutional:      Appearance: Normal appearance. He is normal weight.  Cardiovascular:     Rate and Rhythm: Normal rate and regular rhythm.     Heart sounds: Normal heart sounds.  Pulmonary:     Effort: Pulmonary effort is normal.     Breath sounds: Normal breath sounds.  Abdominal:     General: Bowel sounds are normal.     Palpations: Abdomen is soft.  Musculoskeletal:        General: Normal range of motion.  Skin:    General: Skin is warm and dry.  Neurological:     Mental Status: He is alert and oriented to person, place, and time. Mental status is at baseline.  Psychiatric:        Mood and Affect: Mood normal.        Behavior: Behavior normal.        Thought Content: Thought content normal.        Judgment: Judgment normal.      LABORATORY DATA:  I have reviewed the labs as listed.  CBC    Component Value  Date/Time   WBC 5.7 05/14/2019 0915   RBC 3.63 (L) 05/14/2019 0915   HGB 11.5 (L) 05/14/2019 0915   HCT 36.3 (L) 05/14/2019 0915   PLT 307 05/14/2019 0915   MCV 100.0 05/14/2019 0915   MCH 31.7 05/14/2019 0915   MCHC 31.7 05/14/2019 0915   RDW 15.3 05/14/2019 0915   LYMPHSABS 1.6 05/14/2019 0915   MONOABS 0.8 05/14/2019 0915   EOSABS 0.1 05/14/2019 0915   BASOSABS 0.0 05/14/2019 0915   CMP Latest Ref Rng & Units 05/14/2019 04/23/2019 04/02/2019  Glucose 70 - 99 mg/dL 97 98 89  BUN 8 - 23 mg/dL 18 32(H) 19  Creatinine 0.61 - 1.24 mg/dL 1.62(H) 2.10(H) 1.95(H)  Sodium 135 - 145 mmol/L 136 137 136  Potassium 3.5 - 5.1 mmol/L 4.3 4.5 4.5  Chloride 98 - 111 mmol/L 106 105 105  CO2 22 - 32 mmol/L 23 21(L) 21(L)  Calcium 8.9 - 10.3 mg/dL 8.9 8.7(L) 8.9  Total Protein 6.5 - 8.1 g/dL 7.6 7.8 7.9  Total Bilirubin 0.3 - 1.2 mg/dL 0.2(L) 0.2(L) 0.2(L)  Alkaline Phos 38 - 126 U/L 60 49 63  AST 15 - 41 U/L 38 30 31  ALT 0 - 44 U/L 35 28 23   I have reviewed scans and discussed with the patient.   ASSESSMENT & PLAN:   Malignant neoplasm of prostate ([place]) 1.  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 -Last bone scan on 07/25/2017 showed mixed response compared to prior scan from 09/06/2016.  Last CT scan on 12/20/2016 did not show any visceral metastasis. -For a time his PSA was between 0.7 and 0.9.  However his last reading went up to 1.03. -Level from today is pending.  If it continues to go up, will consider repeating scans. -He denies  any signs or symptoms of peripheral neuropathy.  We will proceed with cabazitaxel today.  I have reviewed his labs.  2.  Bone metastasis: -He is continuing denosumab.  Denies any jaw pains.  3.  Normocytic anemia: -It is from CKD and chemotherapy. -Last Feraheme was on 03/12/2019.  Hemoglobin today is 11.5.  4.  Vitamin D  deficiency: -He will continue vitamin D 50,000 units weekly.  5.  CKD: -His creatinine is better at 1.6 today.     Orders placed this encounter:  Orders Placed This Encounter  Procedures  . CBC with Differential/Platelet  . Comprehensive metabolic panel  . PSA      Francene Finders, FNP-C [place] [phone]

## 2019-05-14 NOTE — Progress Notes (Signed)
Message received from [facility] LPN per Dr. Delton Coombes. Patient is good for treatment.  Labs reviewed by Dr. Delton Coombes. Patient seen today for follow up visit. Vital signs within parameters for treatment. Creatinine 1.62 and Magnesium 1.8.  Per progress note labs reviewed and patient to proceed with treatment per Dr. Delton Coombes via message from [facility] LPN.   Treatment given today per MD orders. Tolerated infusion without adverse affects. Vital signs stable. No complaints at this time. Discharged from clinic ambulatory. F/U with [facility] as scheduled.

## 2019-05-14 NOTE — Patient Instructions (Signed)
[facility] Discharge Instructions for Patients Receiving Chemotherapy  Today you received the following chemotherapy agents   To help prevent nausea and vomiting after your treatment, we encourage you to take your nausea medication   If you develop nausea and vomiting that is not controlled by your nausea medication, call the clinic.   BELOW ARE SYMPTOMS THAT SHOULD BE REPORTED IMMEDIATELY:  *FEVER GREATER THAN 100.5 F  *CHILLS WITH OR WITHOUT FEVER  NAUSEA AND VOMITING THAT IS NOT CONTROLLED WITH YOUR NAUSEA MEDICATION  *UNUSUAL SHORTNESS OF BREATH  *UNUSUAL BRUISING OR BLEEDING  TENDERNESS IN MOUTH AND THROAT WITH OR WITHOUT PRESENCE OF ULCERS  *URINARY PROBLEMS  *BOWEL PROBLEMS  UNUSUAL RASH Items with * indicate a potential emergency and should be followed up as soon as possible.  Feel free to call the clinic should you have any questions or concerns. The clinic phone number is [phone].  Please show the CHEMO ALERT CARD at check-in to the Emergency Department and triage nurse.   

## 2019-06-04 ENCOUNTER — Inpatient Hospital Stay (HOSPITAL_BASED_OUTPATIENT_CLINIC_OR_DEPARTMENT_OTHER): Payer: Medicare HMO | Admitting: Hematology

## 2019-06-04 ENCOUNTER — Encounter (HOSPITAL_COMMUNITY): Payer: Self-pay | Admitting: Hematology

## 2019-06-04 ENCOUNTER — Other Ambulatory Visit: Payer: Self-pay

## 2019-06-04 ENCOUNTER — Inpatient Hospital Stay (HOSPITAL_COMMUNITY): Payer: Medicare HMO | Attending: Hematology

## 2019-06-04 ENCOUNTER — Inpatient Hospital Stay (HOSPITAL_COMMUNITY): Payer: Medicare HMO

## 2019-06-04 VITALS — BP 107/65 | HR 70 | Temp 97.1°F | Resp 18 | Wt 167.5 lb

## 2019-06-04 DIAGNOSIS — Z5111 Encounter for antineoplastic chemotherapy: Secondary | ICD-10-CM | POA: Diagnosis not present

## 2019-06-04 DIAGNOSIS — M899 Disorder of bone, unspecified: Secondary | ICD-10-CM

## 2019-06-04 DIAGNOSIS — E559 Vitamin D deficiency, unspecified: Secondary | ICD-10-CM | POA: Diagnosis not present

## 2019-06-04 DIAGNOSIS — D631 Anemia in chronic kidney disease: Secondary | ICD-10-CM | POA: Diagnosis not present

## 2019-06-04 DIAGNOSIS — N189 Chronic kidney disease, unspecified: Secondary | ICD-10-CM | POA: Insufficient documentation

## 2019-06-04 DIAGNOSIS — E538 Deficiency of other specified B group vitamins: Secondary | ICD-10-CM

## 2019-06-04 DIAGNOSIS — C61 Malignant neoplasm of prostate: Secondary | ICD-10-CM | POA: Diagnosis not present

## 2019-06-04 DIAGNOSIS — C7951 Secondary malignant neoplasm of bone: Secondary | ICD-10-CM | POA: Diagnosis not present

## 2019-06-04 DIAGNOSIS — Z5189 Encounter for other specified aftercare: Secondary | ICD-10-CM | POA: Insufficient documentation

## 2019-06-04 LAB — COMPREHENSIVE METABOLIC PANEL
ALT: 21 U/L (ref 0–44)
AST: 29 U/L (ref 15–41)
Albumin: 4 g/dL (ref 3.5–5.0)
Alkaline Phosphatase: 50 U/L (ref 38–126)
Anion gap: 10 (ref 5–15)
BUN: 16 mg/dL (ref 8–23)
CO2: 26 mmol/L (ref 22–32)
Calcium: 9.1 mg/dL (ref 8.9–10.3)
Chloride: 102 mmol/L (ref 98–111)
Creatinine, Ser: 1.89 mg/dL — ABNORMAL HIGH (ref 0.61–1.24)
GFR calc Af Amer: 40 mL/min — ABNORMAL LOW (ref >60)
GFR calc non Af Amer: 35 mL/min — ABNORMAL LOW (ref >60)
Glucose, Bld: 99 mg/dL (ref 70–99)
Potassium: 4.7 mmol/L (ref 3.5–5.1)
Sodium: 138 mmol/L (ref 135–145)
Total Bilirubin: 0.3 mg/dL (ref 0.3–1.2)
Total Protein: 8 g/dL (ref 6.5–8.1)

## 2019-06-04 LAB — CBC WITH DIFFERENTIAL/PLATELET
Abs Immature Granulocytes: 0.02 10*3/uL (ref 0.00–0.07)
Basophils Absolute: 0 10*3/uL (ref 0.0–0.1)
Basophils Relative: 0 %
Eosinophils Absolute: 0 10*3/uL (ref 0.0–0.5)
Eosinophils Relative: 0 %
HCT: 34.8 % — ABNORMAL LOW (ref 39.0–52.0)
Hemoglobin: 11.1 g/dL — ABNORMAL LOW (ref 13.0–17.0)
Immature Granulocytes: 1 %
Lymphocytes Relative: 35 %
Lymphs Abs: 1.4 10*3/uL (ref 0.7–4.0)
MCH: 31.1 pg (ref 26.0–34.0)
MCHC: 31.9 g/dL (ref 30.0–36.0)
MCV: 97.5 fL (ref 80.0–100.0)
Monocytes Absolute: 0.6 10*3/uL (ref 0.1–1.0)
Monocytes Relative: 15 %
Neutro Abs: 1.9 10*3/uL (ref 1.7–7.7)
Neutrophils Relative %: 49 %
Platelets: 234 10*3/uL (ref 150–400)
RBC: 3.57 MIL/uL — ABNORMAL LOW (ref 4.22–5.81)
RDW: 15.1 % (ref 11.5–15.5)
WBC: 3.9 10*3/uL — ABNORMAL LOW (ref 4.0–10.5)
nRBC: 0 % (ref 0.0–0.2)

## 2019-06-04 LAB — PSA: Prostatic Specific Antigen: 1.13 ng/mL (ref 0.00–4.00)

## 2019-06-04 MED ORDER — HEPARIN SOD (PORK) LOCK FLUSH 100 UNIT/ML IV SOLN
500.0000 [IU] | Freq: Once | INTRAVENOUS | Status: AC | PRN
  Administered 2019-06-04: 500 [IU]

## 2019-06-04 MED ORDER — SODIUM CHLORIDE 0.9% FLUSH
10.0000 mL | INTRAVENOUS | Status: DC | PRN
  Administered 2019-06-04: 10 mL

## 2019-06-04 MED ORDER — SODIUM CHLORIDE 0.9 % IV SOLN: Freq: Once | INTRAVENOUS | Status: AC

## 2019-06-04 MED ORDER — DENOSUMAB 120 MG/1.7ML ~~LOC~~ SOLN
120.0000 mg | Freq: Once | SUBCUTANEOUS | Status: AC
  Administered 2019-06-04: 120 mg via SUBCUTANEOUS
  Filled 2019-06-04: qty 1.7

## 2019-06-04 MED ORDER — DIPHENHYDRAMINE HCL 50 MG/ML IJ SOLN
25.0000 mg | Freq: Once | INTRAMUSCULAR | Status: AC
  Administered 2019-06-04: 25 mg via INTRAVENOUS
  Filled 2019-06-04: qty 1

## 2019-06-04 MED ORDER — FAMOTIDINE IN NACL 20-0.9 MG/50ML-% IV SOLN
20.0000 mg | Freq: Once | INTRAVENOUS | Status: AC
  Administered 2019-06-04: 20 mg via INTRAVENOUS
  Filled 2019-06-04: qty 50

## 2019-06-04 MED ORDER — FULVESTRANT 250 MG/5ML IM SOLN
INTRAMUSCULAR | Status: AC
  Filled 2019-06-04: qty 5

## 2019-06-04 MED ORDER — SODIUM CHLORIDE 0.9 % IV SOLN
20.0000 mg/m2 | Freq: Once | INTRAVENOUS | Status: AC
  Administered 2019-06-04: 41 mg via INTRAVENOUS
  Filled 2019-06-04: qty 4.1

## 2019-06-04 MED ORDER — OCTREOTIDE ACETATE 30 MG IM KIT
PACK | INTRAMUSCULAR | Status: AC
  Filled 2019-06-04: qty 1

## 2019-06-04 MED ORDER — PEGFILGRASTIM 6 MG/0.6ML ~~LOC~~ PSKT
6.0000 mg | PREFILLED_SYRINGE | Freq: Once | SUBCUTANEOUS | Status: AC
  Administered 2019-06-04: 6 mg via SUBCUTANEOUS
  Filled 2019-06-04: qty 0.6

## 2019-06-04 MED ORDER — CYANOCOBALAMIN 1000 MCG/ML IJ SOLN
1000.0000 ug | Freq: Once | INTRAMUSCULAR | Status: AC
  Administered 2019-06-04: 1000 ug via INTRAMUSCULAR
  Filled 2019-06-04: qty 1

## 2019-06-04 MED ORDER — SODIUM CHLORIDE 0.9 % IV SOLN
10.0000 mg | Freq: Once | INTRAVENOUS | Status: AC
  Administered 2019-06-04: 10 mg via INTRAVENOUS
  Filled 2019-06-04: qty 10

## 2019-06-04 NOTE — Assessment & Plan Note (Signed)
1.  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 -Last bone scan on 07/25/2017 with mixed response compared to prior scan from 09/06/2016.  Last CT scan on 12/20/2016 did not show any visceral metastasis. -His baseline PSA has been 0.7 and 0.9.  However the last value on 04/02/2019 increased to 1.03. -We have reviewed his labs.  PSA from today is pending.  If it continues to trend up, we will consider reimaging. -He will proceed with his treatment today.  We will see him back in 3 weeks.  2.  Bone metastasis: -He will continue denosumab.  He will continue calcium supplements.  3.  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  Hemoglobin today is 11.1. -Last Feraheme infusion was on 03/12/2019.  4.  Vitamin D deficiency: -He will continue vitamin D 50,000 units weekly.  5.  CKD: -Creatinine is at baseline of 1.89.

## 2019-06-04 NOTE — Patient Instructions (Addendum)
[facility] Discharge Instructions  You were seen today by Dr. Katragadda. He went over your recent lab results. He will see you back in 3 weeks for labs, treatment and follow up.   Thank you for choosing [facility] at [facility] to provide your oncology and hematology care.  To afford each patient quality time with our provider, please arrive at least 15 minutes before your scheduled appointment time.   If you have a lab appointment with the Cancer Center please come in thru the  Main Entrance and check in at the main information desk  You need to re-schedule your appointment should you arrive 10 or more minutes late.  We strive to give you quality time with our providers, and arriving late affects you and other patients whose appointments are after yours.  Also, if you no show three or more times for appointments you may be dismissed from the clinic at the providers discretion.     Again, thank you for choosing [facility].  Our hope is that these requests will decrease the amount of time that you wait before being seen by our physicians.       _____________________________________________________________  Should you have questions after your visit to [facility], please contact our office at [phone] between the hours of 8:00 a.m. and 4:30 p.m.  Voicemails left after 4:00 p.m. will not be returned until the following business day.  For prescription refill requests, have your pharmacy contact our office and allow 72 hours.    Cancer Center Support Programs:   > Cancer Support Group  2nd Tuesday of the month 1pm-2pm, Journey Room    

## 2019-06-04 NOTE — Progress Notes (Signed)
Patient has been assessed, vital signs and labs have been reviewed by Dr. Katragadda. ANC, Creatinine, LFTs, and Platelets are within treatment parameters per Dr. Katragadda. The patient is good to proceed with treatment at this time.  

## 2019-06-04 NOTE — Progress Notes (Signed)
Labs reviewed with MD today. Creatinine 1.89 noted today. Proceed as planned per MD.   xgeva and b12 injections given today per orders.  See MAR for details.  [name] KitchenAdriana [name] arrived today for [facility] neulasta on body injector. See MAR for administration details. Injector in place and engaged with green light indicator on flashing. Tolerated application with out problems.  Treatment given per orders. Patient tolerated it well without problems. Vitals stable and discharged home from clinic ambulatory. Follow up as scheduled.

## 2019-06-04 NOTE — Progress Notes (Signed)
Jeff [name], [place] [address]   CLINIC:  Medical Oncology/Hematology  PCP:  Holley Bouche, NP (Inactive) [place] [address] [phone]   REASON FOR VISIT: Follow-up for prostate cancer  CURRENT THERAPY:  Cabazitaxel every 3 weeks   BRIEF ONCOLOGIC HISTORY:  Oncology History  Malignant neoplasm of prostate ([place])  01/26/2014 Tumor Marker   PSA > 5000   01/28/2014 Initial Biopsy   Metastatic adenocarcinoma of prostate   02/05/2014 Surgery   Bilateral orchiectomy by Dr. Junious Silk   02/26/2014 Tumor Marker   PSA = 399.5   03/14/2014 Tumor Marker   PSA= 89.51   03/14/2014 - 06/26/2014 Chemotherapy   Docetaxel 75 mg/kg every 21 days x 6 cycles   04/24/2014 Tumor Marker   PSA- 19.89   07/23/2014 Procedure   Nephrostomy tube removed by IR, Dr. Geroge Baseman   07/24/2014 Tumor Marker   PSA= 6.15   10/16/2014 Tumor Marker   PSA: 3.54    01/08/2015 Tumor Marker   PSA: 2.13    01/17/2015 Imaging   CT CAP-  Massive pelvic and retroperitoneal lymphadenopathy noted on the prior study has nearly completely resolved, now with only a small amount of residual amorphous soft tissue predominantly around the the infrarenal abdominal aorta.   01/17/2015 Imaging   Bone scan- Widespread osseous metastatic disease with multiple foci of increased activity throughout the skeleton, corresponding with the findings on the prior CT from October, 2015.   04/11/2015 Tumor Marker   PSA: 1.87    07/21/2015 Imaging   Bone scan- Bony metastatic disease again noted at multiple sites, stable from prior study. No progression of bony metastatic disease is demonstrable on this study.   07/25/2015 Imaging   CT CAP- Stable matted soft tissue density in the retroperitoneum and extraperitoneal pelvis consistent with treated disease. No recurrent lymphadenopathy.  No pulmonary metastatic disease. Diffuse stable sclerotic metastatic bone disease.     01/14/2016 Imaging   Bone scan-  Multiple sites of abnormal increased tracer localization involving BILATERAL ribs, T11, T12, questionably RIGHT scapula and LEFT iliac bone suspicious for osseous metastases.   01/23/2016 Imaging   CT CAP- 1. Similar widespread osseous metastasis. 2. Similar soft tissue thickening within the retroperitoneum of the abdomen. Improved left pelvic side wall soft tissue thickening. These are consistent with sites of treated disease. No well-defined adenopathy. 3. No new sites of disease.   05/03/2016 -  Chemotherapy   Jevtana every 21 days    05/03/2016 -  Chemotherapy   The patient had pegfilgrastim (NEULASTA) injection 6 mg, 6 mg, Subcutaneous,  Once, 1 of 1 cycle Administration: 6 mg (05/05/2016) pegfilgrastim (NEULASTA ONPRO KIT) injection 6 mg, 6 mg, Subcutaneous, Once, 46 of 49 cycles Administration: 6 mg (05/24/2016), 6 mg (06/14/2016), 6 mg (07/07/2016), 6 mg (07/28/2016), 6 mg (08/18/2016), 6 mg (09/08/2016), 6 mg (09/29/2016), 6 mg (10/20/2016), 6 mg (11/10/2016), 6 mg (02/14/2017), 6 mg (03/07/2017), 6 mg (03/28/2017), 6 mg (04/20/2017), 6 mg (05/11/2017), 6 mg (06/27/2017), 6 mg (08/30/2017), 6 mg (09/20/2017), 6 mg (10/11/2017), 6 mg (11/02/2017), 6 mg (08/09/2017), 6 mg (11/23/2017), 6 mg (12/14/2017), 6 mg (01/09/2018), 6 mg (01/31/2018), 6 mg (04/04/2018), 6 mg (04/27/2018), 6 mg (05/18/2018), 6 mg (06/08/2018), 6 mg (06/29/2018), 6 mg (07/20/2018), 6 mg (08/10/2018), 6 mg (09/01/2018), 6 mg (09/22/2018), 6 mg (10/13/2018), 6 mg (11/03/2018), 6 mg (11/24/2018), 6 mg (12/15/2018), 6 mg (01/05/2019), 6 mg (01/26/2019), 6 mg (02/16/2019), 6 mg (03/12/2019), 6 mg (  04/02/2019), 6 mg (04/23/2019), 6 mg (05/14/2019) cabazitaxel (JEVTANA) 41 mg in dextrose 5 % 250 mL chemo infusion, 20 mg/m2 = 41 mg (100 % of original dose 20 mg/m2), Intravenous,  Once, 47 of 50 cycles Dose modification: 20 mg/m2 (original dose 20 mg/m2, Cycle 1, Reason: Dose not tolerated, Comment: recommended by Data to use 60m/m), 20  mg/m2 (original dose 20 mg/m2, Cycle 37, Reason: Other (see comments), Comment: Changed to new orderable), 21 mg/m2 (original dose 20 mg/m2, Cycle 40, Reason: Other (see comments)), 20 mg/m2 (original dose 20 mg/m2, Cycle 40, Reason: Other (see comments)) Administration: 41 mg (05/03/2016), 41 mg (05/24/2016), 41 mg (06/14/2016), 41 mg (07/07/2016), 41 mg (07/28/2016), 41 mg (08/18/2016), 41 mg (09/08/2016), 41 mg (09/29/2016), 41 mg (10/20/2016), 41 mg (11/10/2016), 41 mg (02/14/2017), 41 mg (03/07/2017), 41 mg (03/28/2017), 41 mg (04/20/2017), 41 mg (05/11/2017), 41 mg (06/27/2017), 41 mg (08/30/2017), 41 mg (09/20/2017), 41 mg (10/11/2017), 41 mg (11/02/2017), 41 mg (08/09/2017), 41 mg (11/23/2017), 41 mg (12/14/2017), 41 mg (01/09/2018), 41 mg (01/31/2018), 41 mg (02/21/2018), 41 mg (04/04/2018), 41 mg (04/27/2018), 41 mg (05/18/2018), 41 mg (06/08/2018), 41 mg (06/29/2018), 41 mg (07/20/2018), 41 mg (08/10/2018), 41 mg (09/01/2018), 41 mg (09/22/2018), 41 mg (10/13/2018), 41 mg (11/03/2018), 41 mg (11/24/2018), 41 mg (12/15/2018), 41 mg (01/05/2019), 41 mg (01/26/2019), 41 mg (02/16/2019), 41 mg (03/12/2019), 41 mg (04/02/2019), 41 mg (04/23/2019), 41 mg (05/14/2019)  for chemotherapy treatment.    09/06/2016 Imaging   Restaging CT C/A/P: IMPRESSION: 1. Similar appearance of widespread sclerotic bone metastases. 2. No change in soft tissue thickening within the retroperitoneum and left pelvic sidewall. No well defined adenopathy identified. 3. No new sites of disease 4. Aortic Atherosclerosis (ICD10-I70.0). Coronary artery calcifications noted. 5. 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 6. Stable 9 mm pancreatic cystic lesion. Favor pseudocyst. Indolent neoplasm may look similar. Attention on follow-up imaging.   09/06/2016 Imaging   Bone Scan: IMPRESSION: In this patient with known diffuse sclerotic metastatic disease, bone scan does not reveal progression of radiotracer  uptake. Several of the areas of previously demonstrated radiotracer uptake appear less prominent.  Change in appearance of radiotracer uptake involving the mandible now more notable on the right and previously more notable on the left may reflect result of dental disease rather than metastatic disease.    04/08/2017 Tumor Marker   PSA 0.89     INTERVAL HISTORY:  [name]89y.o. male seen for follow-up and toxicity assessment prior to chemotherapy for metastatic prostate cancer.  Appetite is 100%.  Energy levels are 75%.  Chronic cough has been stable.  Denies any nausea, vomiting, diarrhea or constipation from chemotherapy.  Denies any tingling or numbness in extremities.  REVIEW OF SYSTEMS:  Review of Systems  All other systems reviewed and are negative.    PAST MEDICAL/SURGICAL HISTORY:  Past Medical History:  Diagnosis Date  . Acute renal failure ([facility] 01/26/2014  . Alcohol abuse   . Anemia of chronic disease 01/26/2014  . Bilateral hydronephrosis 01/26/2014  . History of cocaine abuse ([facility] 01/26/2014  . Homelessness 01/31/2014  . Prostate cancer [place] to multiple sites [place] 01/30/2014  . [place]is [place]   . UTI (lower urinary tract infection)    Past Surgical History:  Procedure Laterality Date  . ORCHIECTOMY Bilateral 02/05/2014   Procedure: BILATERAL ORCHIECTOMY;  Surgeon: MFestus Aloe MD;  Location: [facility];  Service: Urology;  Laterality: Bilateral;  . PERCUTANEOUS NEPHROSTOMY Bilateral    IR Dr. EJunious Silk  changed on 04/22/2014  . PORT-A-CATH REMOVAL Right 08/08/2017   Procedure: REMOVAL PORT-A-CATH RIGHT CHEST (PROCEDURE #2);  Surgeon: Aviva Signs, MD;  Location: [facility];  Service: General;  Laterality: Right;  . PORTACATH PLACEMENT Right 03/12/14  . PORTACATH PLACEMENT Left 08/08/2017   Procedure: INSERTION POWER PORT WITH ATTACHED CATHETER LEFT SUBCLAVIAN (PROCEDURE #1);  Surgeon: Aviva Signs, MD;  Location: [facility];  Service: General;  Laterality:  Left;     SOCIAL HISTORY:  Social History   Socioeconomic History  . Marital status: Single    Spouse name: Not on file  . Number of children: 1  . Years of education: 9th  . Highest education level: Not on file  Occupational History  . Occupation: disabilty    Employer: [employer]  Tobacco Use  . Smoking status: Current Every Day Smoker    Packs/day: 0.25    Years: 55.00    Pack years: 13.75    Types: Cigarettes  . Smokeless tobacco: Never Used  Substance and Sexual Activity  . Alcohol use: Yes    Alcohol/week: 1.0 standard drinks    Types: 1 Cans of beer per week  . Drug use: Yes    Types: Cocaine    Comment: last used Cocaine 01/25/14  . Sexual activity: Yes  Other Topics Concern  . Not on file  Social History Narrative   Lives at [employer] 9[employer] grade   1 son, lives in [place], [place]   Can read and write in native language            Social Determinants of Health   Financial Resource Strain:   . Difficulty of Paying Living Expenses: Not on file  Food Insecurity:   . Worried About Charity fundraiser in the Last Year: Not on file  . Ran Out of Food in the Last Year: Not on file  Transportation Needs:   . Lack of Transportation (Medical): Not on file  . Lack of Transportation (Non-Medical): Not on file  Physical Activity:   . Days of Exercise per Week: Not on file  . Minutes of Exercise per Session: Not on file  Stress:   . Feeling of Stress : Not on file  Social Connections:   . Frequency of Communication with Friends and Family: Not on file  . Frequency of Social Gatherings with Friends and Family: Not on file  . Attends Religious Services: Not on file  . Active Member of Clubs or Organizations: Not on file  . Attends Archivist Meetings: Not on file  . Marital Status: Not on file  Intimate Partner Violence:   . Fear of Current or Ex-Partner: Not on file  . Emotionally Abused: Not on file  . Physically Abused: Not  on file  . Sexually Abused: Not on file    FAMILY HISTORY:  Family History  Problem Relation Age of Onset  . Cancer Brother 49    CURRENT MEDICATIONS:  Outpatient Encounter Medications as of 06/04/2019  Medication Sig Note  . calcium gluconate 500 MG tablet Take 1 tablet by mouth 2 (two) times daily.   . prochlorperazine (COMPAZINE) 10 MG tablet The day after chemo take 1 tab four times a day x 2 days. Then may take 1 tab four times a day if needed for nausea/vomiting. (Patient not taking: Reported on 03/12/2019) 07/24/2014: Just takes as needed   Facility-Administered Encounter Medications as of 06/04/2019  Medication  . cyanocobalamin ((VITAMIN B-12)) injection 1,000  mcg  . denosumab (XGEVA) injection 120 mg    ALLERGIES:  No Known Allergies   PHYSICAL EXAM:  ECOG Performance status: 1  Vitals:   06/04/19 0831  BP: 107/65  Pulse: 70  Resp: 18  Temp: (!) 97.1 F (36.2 C)  SpO2: 100%   Filed Weights   06/04/19 0831  Weight: 167 lb 8 oz (76 kg)    Physical Exam Constitutional:      Appearance: Normal appearance. He is normal weight.  Cardiovascular:     Rate and Rhythm: Normal rate and regular rhythm.     Heart sounds: Normal heart sounds.  Pulmonary:     Effort: Pulmonary effort is normal.     Breath sounds: Normal breath sounds.  Abdominal:     General: Bowel sounds are normal.     Palpations: Abdomen is soft.  Musculoskeletal:        General: Normal range of motion.  Skin:    General: Skin is warm and dry.  Neurological:     Mental Status: He is alert and oriented to person, place, and time. Mental status is at baseline.  Psychiatric:        Mood and Affect: Mood normal.        Behavior: Behavior normal.        Thought Content: Thought content normal.        Judgment: Judgment normal.      LABORATORY DATA:  I have reviewed the labs as listed.  CBC    Component Value Date/Time   WBC 3.9 (L) 06/04/2019 0806   RBC 3.57 (L) 06/04/2019 0806   HGB  11.1 (L) 06/04/2019 0806   HCT 34.8 (L) 06/04/2019 0806   PLT 234 06/04/2019 0806   MCV 97.5 06/04/2019 0806   MCH 31.1 06/04/2019 0806   MCHC 31.9 06/04/2019 0806   RDW 15.1 06/04/2019 0806   LYMPHSABS 1.4 06/04/2019 0806   MONOABS 0.6 06/04/2019 0806   EOSABS 0.0 06/04/2019 0806   BASOSABS 0.0 06/04/2019 0806   CMP Latest Ref Rng & Units 06/04/2019 05/14/2019 04/23/2019  Glucose 70 - 99 mg/dL 99 97 98  BUN 8 - 23 mg/dL 16 18 32(H)  Creatinine 0.61 - 1.24 mg/dL 1.89(H) 1.62(H) 2.10(H)  Sodium 135 - 145 mmol/L 138 136 137  Potassium 3.5 - 5.1 mmol/L 4.7 4.3 4.5  Chloride 98 - 111 mmol/L 102 106 105  CO2 22 - 32 mmol/L 26 23 21(L)  Calcium 8.9 - 10.3 mg/dL 9.1 8.9 8.7(L)  Total Protein 6.5 - 8.1 g/dL 8.0 7.6 7.8  Total Bilirubin 0.3 - 1.2 mg/dL 0.3 0.2(L) 0.2(L)  Alkaline Phos 38 - 126 U/L 50 60 49  AST 15 - 41 U/L 29 38 30  ALT 0 - 44 U/L 21 35 28   I have reviewed scans and discussed with the patient.   ASSESSMENT & PLAN:   Malignant neoplasm of prostate ([place]) 1.  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 -Last bone scan on 07/25/2017 with mixed response compared to prior scan from 09/06/2016.  Last CT scan on 12/20/2016 did not show any visceral metastasis. -His baseline PSA has been 0.7 and 0.9.  However the last value on 04/02/2019 increased to 1.03. -We have reviewed his labs.  PSA from today is pending.  If it continues to trend up, we will consider reimaging. -He will proceed with his treatment today.  We will see him back in 3 weeks.  2.  Bone metastasis: -He will continue denosumab.  He will continue calcium supplements.  3.  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  Hemoglobin today is 11.1. -Last Feraheme infusion was on 03/12/2019.  4.  Vitamin D deficiency: -He will continue vitamin D 50,000 units weekly.  5.  CKD: -Creatinine is at baseline of 1.89.     Orders placed this  encounter:  Orders Placed This Encounter  Procedures  . CBC with Differential/Platelet  . Comprehensive metabolic panel  . PSA      Derek Jack, MD [place] [phone]

## 2019-06-25 ENCOUNTER — Inpatient Hospital Stay (HOSPITAL_COMMUNITY): Payer: Medicare HMO | Attending: Hematology | Admitting: Hematology

## 2019-06-25 ENCOUNTER — Other Ambulatory Visit: Payer: Self-pay

## 2019-06-25 ENCOUNTER — Encounter (HOSPITAL_COMMUNITY): Payer: Self-pay | Admitting: Hematology

## 2019-06-25 ENCOUNTER — Inpatient Hospital Stay (HOSPITAL_COMMUNITY): Payer: Medicare HMO

## 2019-06-25 VITALS — BP 93/55 | HR 70 | Temp 97.5°F | Resp 18

## 2019-06-25 VITALS — BP 95/66 | HR 85 | Temp 97.1°F | Resp 18 | Wt 164.0 lb

## 2019-06-25 DIAGNOSIS — C61 Malignant neoplasm of prostate: Secondary | ICD-10-CM | POA: Insufficient documentation

## 2019-06-25 DIAGNOSIS — D631 Anemia in chronic kidney disease: Secondary | ICD-10-CM | POA: Diagnosis not present

## 2019-06-25 DIAGNOSIS — E875 Hyperkalemia: Secondary | ICD-10-CM | POA: Diagnosis not present

## 2019-06-25 DIAGNOSIS — Z5189 Encounter for other specified aftercare: Secondary | ICD-10-CM | POA: Insufficient documentation

## 2019-06-25 DIAGNOSIS — E559 Vitamin D deficiency, unspecified: Secondary | ICD-10-CM | POA: Diagnosis not present

## 2019-06-25 DIAGNOSIS — N189 Chronic kidney disease, unspecified: Secondary | ICD-10-CM | POA: Insufficient documentation

## 2019-06-25 DIAGNOSIS — Z5111 Encounter for antineoplastic chemotherapy: Secondary | ICD-10-CM | POA: Insufficient documentation

## 2019-06-25 DIAGNOSIS — C7951 Secondary malignant neoplasm of bone: Secondary | ICD-10-CM | POA: Diagnosis not present

## 2019-06-25 LAB — CBC WITH DIFFERENTIAL/PLATELET
Abs Immature Granulocytes: 0.03 10*3/uL (ref 0.00–0.07)
Basophils Absolute: 0 10*3/uL (ref 0.0–0.1)
Basophils Relative: 0 %
Eosinophils Absolute: 0 10*3/uL (ref 0.0–0.5)
Eosinophils Relative: 1 %
HCT: 37.3 % — ABNORMAL LOW (ref 39.0–52.0)
Hemoglobin: 11.9 g/dL — ABNORMAL LOW (ref 13.0–17.0)
Immature Granulocytes: 1 %
Lymphocytes Relative: 28 %
Lymphs Abs: 1.8 10*3/uL (ref 0.7–4.0)
MCH: 31 pg (ref 26.0–34.0)
MCHC: 31.9 g/dL (ref 30.0–36.0)
MCV: 97.1 fL (ref 80.0–100.0)
Monocytes Absolute: 0.7 10*3/uL (ref 0.1–1.0)
Monocytes Relative: 11 %
Neutro Abs: 3.8 10*3/uL (ref 1.7–7.7)
Neutrophils Relative %: 59 %
Platelets: 304 10*3/uL (ref 150–400)
RBC: 3.84 MIL/uL — ABNORMAL LOW (ref 4.22–5.81)
RDW: 16.2 % — ABNORMAL HIGH (ref 11.5–15.5)
WBC: 6.3 10*3/uL (ref 4.0–10.5)
nRBC: 0 % (ref 0.0–0.2)

## 2019-06-25 LAB — COMPREHENSIVE METABOLIC PANEL
ALT: 19 U/L (ref 0–44)
AST: 24 U/L (ref 15–41)
Albumin: 4.2 g/dL (ref 3.5–5.0)
Alkaline Phosphatase: 53 U/L (ref 38–126)
Anion gap: 10 (ref 5–15)
BUN: 24 mg/dL — ABNORMAL HIGH (ref 8–23)
CO2: 24 mmol/L (ref 22–32)
Calcium: 9.1 mg/dL (ref 8.9–10.3)
Chloride: 100 mmol/L (ref 98–111)
Creatinine, Ser: 2.32 mg/dL — ABNORMAL HIGH (ref 0.61–1.24)
GFR calc Af Amer: 31 mL/min — ABNORMAL LOW (ref >60)
GFR calc non Af Amer: 27 mL/min — ABNORMAL LOW (ref >60)
Glucose, Bld: 95 mg/dL (ref 70–99)
Potassium: 5.1 mmol/L (ref 3.5–5.1)
Sodium: 134 mmol/L — ABNORMAL LOW (ref 135–145)
Total Bilirubin: 0.3 mg/dL (ref 0.3–1.2)
Total Protein: 8.3 g/dL — ABNORMAL HIGH (ref 6.5–8.1)

## 2019-06-25 LAB — PSA: Prostatic Specific Antigen: 1.27 ng/mL (ref 0.00–4.00)

## 2019-06-25 MED ORDER — HEPARIN SOD (PORK) LOCK FLUSH 100 UNIT/ML IV SOLN
500.0000 [IU] | Freq: Once | INTRAVENOUS | Status: AC | PRN
  Administered 2019-06-25: 500 [IU]

## 2019-06-25 MED ORDER — SODIUM CHLORIDE 0.9 % IV SOLN: Freq: Once | INTRAVENOUS | Status: AC

## 2019-06-25 MED ORDER — SODIUM CHLORIDE 0.9 % IV SOLN
10.0000 mg | Freq: Once | INTRAVENOUS | Status: AC
  Administered 2019-06-25: 10 mg via INTRAVENOUS
  Filled 2019-06-25: qty 10

## 2019-06-25 MED ORDER — SODIUM CHLORIDE 0.9 % IV SOLN
20.0000 mg/m2 | Freq: Once | INTRAVENOUS | Status: AC
  Administered 2019-06-25: 41 mg via INTRAVENOUS
  Filled 2019-06-25: qty 4.1

## 2019-06-25 MED ORDER — PEGFILGRASTIM 6 MG/0.6ML ~~LOC~~ PSKT
6.0000 mg | PREFILLED_SYRINGE | Freq: Once | SUBCUTANEOUS | Status: AC
  Administered 2019-06-25: 6 mg via SUBCUTANEOUS
  Filled 2019-06-25: qty 0.6

## 2019-06-25 MED ORDER — FAMOTIDINE IN NACL 20-0.9 MG/50ML-% IV SOLN
20.0000 mg | Freq: Once | INTRAVENOUS | Status: AC
  Administered 2019-06-25: 20 mg via INTRAVENOUS

## 2019-06-25 MED ORDER — DIPHENHYDRAMINE HCL 50 MG/ML IJ SOLN
INTRAMUSCULAR | Status: AC
  Filled 2019-06-25: qty 1

## 2019-06-25 MED ORDER — FAMOTIDINE IN NACL 20-0.9 MG/50ML-% IV SOLN
INTRAVENOUS | Status: AC
  Filled 2019-06-25: qty 50

## 2019-06-25 MED ORDER — SODIUM CHLORIDE 0.9% FLUSH
10.0000 mL | INTRAVENOUS | Status: DC | PRN
  Administered 2019-06-25: 10 mL

## 2019-06-25 MED ORDER — DIPHENHYDRAMINE HCL 50 MG/ML IJ SOLN
25.0000 mg | Freq: Once | INTRAMUSCULAR | Status: AC
  Administered 2019-06-25: 25 mg via INTRAVENOUS

## 2019-06-25 NOTE — Assessment & Plan Note (Addendum)
1.  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 -Bone scan on 07/25/2017 showed mixed response compared to prior scan from 09/06/2016.  Last CT scan on 12/20/2016 not show any visceral metastasis. -Baseline PSAs between 0.7 and 0.9.  Latest PSA on 06/04/2019 was 1.13.  Previously it was 1.03. -I have reviewed his CBC today.  LFTs are normal. -She will proceed with his next cycle today.  I will reevaluate him in 3 weeks for follow-up.  2.  Renal insufficiency: -His baseline creatinine is around 1.89.  Today his creatinine went up to 2.32.  Potassium is also slightly high at 5.1. -We will give him 1 L of normal saline over 2 hours.  I have recommended to avoid potassium rich foods.  3.  Bone metastasis: -He is continuing denosumab monthly.  He will continue calcium supplements.  4.  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  Hemoglobin today is 11.9. -Last Feraheme infusion was on 03/12/2019.  5.  Vitamin D deficiency: -He will continue vitamin D 50,000 units weekly.

## 2019-06-25 NOTE — Progress Notes (Signed)
Patient has been assessed, vital signs and labs have been reviewed by Dr. Delton Coombes. ANC, Creatinine, LFTs, and Platelets are within treatment parameters per Dr. Delton Coombes. The patient is good to proceed with treatment at this time. Please give an extra 1 liter of NS today per Dr. Delton Coombes.

## 2019-06-25 NOTE — Progress Notes (Signed)
Jeff [name], [place] [address]   CLINIC:  Medical Oncology/Hematology  PCP:  Jeff Bouche, NP (Inactive) [place] [address] [phone]   REASON FOR VISIT: Follow-up for prostate cancer  CURRENT THERAPY:  Cabazitaxel every 3 weeks   BRIEF ONCOLOGIC HISTORY:  Oncology History  Malignant neoplasm of prostate ([place])  01/26/2014 Tumor Marker   PSA > 5000   01/28/2014 Initial Biopsy   Metastatic adenocarcinoma of prostate   02/05/2014 Surgery   Bilateral orchiectomy by Dr. Junious Silk   02/26/2014 Tumor Marker   PSA = 399.5   03/14/2014 Tumor Marker   PSA= 89.51   03/14/2014 - 06/26/2014 Chemotherapy   Docetaxel 75 mg/kg every 21 days x 6 cycles   04/24/2014 Tumor Marker   PSA- 19.89   07/23/2014 Procedure   Nephrostomy tube removed by IR, Dr. Geroge Baseman   07/24/2014 Tumor Marker   PSA= 6.15   10/16/2014 Tumor Marker   PSA: 3.54    01/08/2015 Tumor Marker   PSA: 2.13    01/17/2015 Imaging   CT CAP-  Massive pelvic and retroperitoneal lymphadenopathy noted on the prior study has nearly completely resolved, now with only a small amount of residual amorphous soft tissue predominantly around the the infrarenal abdominal aorta.   01/17/2015 Imaging   Bone scan- Widespread osseous metastatic disease with multiple foci of increased activity throughout the skeleton, corresponding with the findings on the prior CT from October, 2015.   04/11/2015 Tumor Marker   PSA: 1.87    07/21/2015 Imaging   Bone scan- Bony metastatic disease again noted at multiple sites, stable from prior study. No progression of bony metastatic disease is demonstrable on this study.   07/25/2015 Imaging   CT CAP- Stable matted soft tissue density in the retroperitoneum and extraperitoneal pelvis consistent with treated disease. No recurrent lymphadenopathy.  No pulmonary metastatic disease. Diffuse stable sclerotic metastatic bone disease.    01/14/2016 Imaging   Bone scan-  Multiple sites of abnormal increased tracer localization involving BILATERAL ribs, T11, T12, questionably RIGHT scapula and LEFT iliac bone suspicious for osseous metastases.   01/23/2016 Imaging   CT CAP- 1. Similar widespread osseous metastasis. 2. Similar soft tissue thickening within the retroperitoneum of the abdomen. Improved left pelvic side wall soft tissue thickening. These are consistent with sites of treated disease. No well-defined adenopathy. 3. No new sites of disease.   05/03/2016 -  Chemotherapy   Jevtana every 21 days    05/03/2016 -  Chemotherapy   The patient had pegfilgrastim (NEULASTA) injection 6 mg, 6 mg, Subcutaneous,  Once, 1 of 1 cycle Administration: 6 mg (05/05/2016) pegfilgrastim (NEULASTA ONPRO KIT) injection 6 mg, 6 mg, Subcutaneous, Once, 48 of 49 cycles Administration: 6 mg (05/24/2016), 6 mg (06/14/2016), 6 mg (07/07/2016), 6 mg (07/28/2016), 6 mg (08/18/2016), 6 mg (09/08/2016), 6 mg (09/29/2016), 6 mg (10/20/2016), 6 mg (11/10/2016), 6 mg (02/14/2017), 6 mg (03/07/2017), 6 mg (03/28/2017), 6 mg (04/20/2017), 6 mg (05/11/2017), 6 mg (06/27/2017), 6 mg (08/30/2017), 6 mg (09/20/2017), 6 mg (10/11/2017), 6 mg (11/02/2017), 6 mg (08/09/2017), 6 mg (11/23/2017), 6 mg (12/14/2017), 6 mg (01/09/2018), 6 mg (01/31/2018), 6 mg (04/04/2018), 6 mg (04/27/2018), 6 mg (05/18/2018), 6 mg (06/08/2018), 6 mg (06/29/2018), 6 mg (07/20/2018), 6 mg (08/10/2018), 6 mg (09/01/2018), 6 mg (09/22/2018), 6 mg (10/13/2018), 6 mg (11/03/2018), 6 mg (11/24/2018), 6 mg (12/15/2018), 6 mg (01/05/2019), 6 mg (01/26/2019), 6 mg (02/16/2019), 6 mg (03/12/2019), 6 mg (04/02/2019),  6 mg (04/23/2019), 6 mg (05/14/2019), 6 mg (06/04/2019), 6 mg (06/25/2019) cabazitaxel (JEVTANA) 41 mg in dextrose 5 % 250 mL chemo infusion, 20 mg/m2 = 41 mg (100 % of original dose 20 mg/m2), Intravenous,  Once, 49 of 50 cycles Dose modification: 20 mg/m2 (original dose 20 mg/m2, Cycle 1, Reason: Dose not tolerated, Comment:  recommended by Data to use 68m/m), 20 mg/m2 (original dose 20 mg/m2, Cycle 37, Reason: Other (see comments), Comment: Changed to new orderable), 21 mg/m2 (original dose 20 mg/m2, Cycle 40, Reason: Other (see comments)), 20 mg/m2 (original dose 20 mg/m2, Cycle 40, Reason: Other (see comments)) Administration: 41 mg (05/03/2016), 41 mg (05/24/2016), 41 mg (06/14/2016), 41 mg (07/07/2016), 41 mg (07/28/2016), 41 mg (08/18/2016), 41 mg (09/08/2016), 41 mg (09/29/2016), 41 mg (10/20/2016), 41 mg (11/10/2016), 41 mg (02/14/2017), 41 mg (03/07/2017), 41 mg (03/28/2017), 41 mg (04/20/2017), 41 mg (05/11/2017), 41 mg (06/27/2017), 41 mg (08/30/2017), 41 mg (09/20/2017), 41 mg (10/11/2017), 41 mg (11/02/2017), 41 mg (08/09/2017), 41 mg (11/23/2017), 41 mg (12/14/2017), 41 mg (01/09/2018), 41 mg (01/31/2018), 41 mg (02/21/2018), 41 mg (04/04/2018), 41 mg (04/27/2018), 41 mg (05/18/2018), 41 mg (06/08/2018), 41 mg (06/29/2018), 41 mg (07/20/2018), 41 mg (08/10/2018), 41 mg (09/01/2018), 41 mg (09/22/2018), 41 mg (10/13/2018), 41 mg (11/03/2018), 41 mg (11/24/2018), 41 mg (12/15/2018), 41 mg (01/05/2019), 41 mg (01/26/2019), 41 mg (02/16/2019), 41 mg (03/12/2019), 41 mg (04/02/2019), 41 mg (04/23/2019), 41 mg (05/14/2019), 41 mg (06/04/2019), 41 mg (06/25/2019)  for chemotherapy treatment.    09/06/2016 Imaging   Restaging CT C/A/P: IMPRESSION: 1. Similar appearance of widespread sclerotic bone metastases. 2. No change in soft tissue thickening within the retroperitoneum and left pelvic sidewall. No well defined adenopathy identified. 3. No new sites of disease 4. Aortic Atherosclerosis (ICD10-I70.0). Coronary artery calcifications noted. 5. 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 6. Stable 9 mm pancreatic cystic lesion. Favor pseudocyst. Indolent neoplasm may look similar. Attention on follow-up imaging.   09/06/2016 Imaging   Bone Scan: IMPRESSION: In this patient with known diffuse sclerotic  metastatic disease, bone scan does not reveal progression of radiotracer uptake. Several of the areas of previously demonstrated radiotracer uptake appear less prominent.  Change in appearance of radiotracer uptake involving the mandible now more notable on the right and previously more notable on the left may reflect result of dental disease rather than metastatic disease.    04/08/2017 Tumor Marker   PSA 0.89     INTERVAL HISTORY:  [name]89y.o. male seen for follow-up and toxicity assessment prior to next cycle of chemotherapy for his metastatic prostate cancer.  Denies any tingling or numbness next 20s.  Denies any fevers or chills.  Appetite is 100%.  Energy levels are 75%.  Reports that he has been drinking adequate water.  REVIEW OF SYSTEMS:  Review of Systems  All other systems reviewed and are negative.    PAST MEDICAL/SURGICAL HISTORY:  Past Medical History:  Diagnosis Date  . Acute renal failure ([place] 01/26/2014  . Alcohol abuse   . Anemia of chronic disease 01/26/2014  . Bilateral hydronephrosis 01/26/2014  . History of cocaine abuse ([facility] 01/26/2014  . Homelessness 01/31/2014  . Prostate cancer metastatic to multiple sites ([facility] 01/30/2014  . Sepsis ([facility]   . UTI (lower urinary tract infection)    Past Surgical History:  Procedure Laterality Date  . ORCHIECTOMY Bilateral 02/05/2014   Procedure: BILATERAL ORCHIECTOMY;  Surgeon: MFestus Aloe MD;  Location: [facility];  Service: Urology;  Laterality: Bilateral;  . PERCUTANEOUS NEPHROSTOMY Bilateral    IR Dr. Junious Silk changed on 04/22/2014  . PORT-A-CATH REMOVAL Right 08/08/2017   Procedure: REMOVAL PORT-A-CATH RIGHT CHEST (PROCEDURE #2);  Surgeon: Aviva Signs, MD;  Location: [facility];  Service: General;  Laterality: Right;  . PORTACATH PLACEMENT Right 03/12/14  . PORTACATH PLACEMENT Left 08/08/2017   Procedure: INSERTION POWER PORT WITH ATTACHED CATHETER LEFT SUBCLAVIAN (PROCEDURE #1);  Surgeon: Aviva Signs, MD;  Location: [facility];  Service: General;  Laterality: Left;     SOCIAL HISTORY:  Social History   Socioeconomic History  . Marital status: Single    Spouse name: Not on file  . Number of children: 1  . Years of education: 9th  . Highest education level: Not on file  Occupational History  . Occupation: disabilty    Employer: [employer]  Tobacco Use  . Smoking status: Current Every Day Smoker    Packs/day: 0.25    Years: 55.00    Pack years: 13.75    Types: Cigarettes  . Smokeless tobacco: Never Used  Substance and Sexual Activity  . Alcohol use: Yes    Alcohol/week: 1.0 standard drinks    Types: 1 Cans of beer per week  . Drug use: Yes    Types: Cocaine    Comment: last used Cocaine 01/25/14  . Sexual activity: Yes  Other Topics Concern  . Not on file  Social History Narrative   Lives at [employer] 9[employer] grade   1 son, lives in [place], [place]   Can read and write in native language            Social Determinants of Health   Financial Resource Strain:   . Difficulty of Paying Living Expenses: Not on file  Food Insecurity:   . Worried About Charity fundraiser in the Last Year: Not on file  . Ran Out of Food in the Last Year: Not on file  Transportation Needs:   . Lack of Transportation (Medical): Not on file  . Lack of Transportation (Non-Medical): Not on file  Physical Activity:   . Days of Exercise per Week: Not on file  . Minutes of Exercise per Session: Not on file  Stress:   . Feeling of Stress : Not on file  Social Connections:   . Frequency of Communication with Friends and Family: Not on file  . Frequency of Social Gatherings with Friends and Family: Not on file  . Attends Religious Services: Not on file  . Active Member of Clubs or Organizations: Not on file  . Attends Archivist Meetings: Not on file  . Marital Status: Not on file  Intimate Partner Violence:   . Fear of Current or Ex-Partner: Not on file   . Emotionally Abused: Not on file  . Physically Abused: Not on file  . Sexually Abused: Not on file    FAMILY HISTORY:  Family History  Problem Relation Age of Onset  . Cancer Brother 72    CURRENT MEDICATIONS:  Outpatient Encounter Medications as of 06/25/2019  Medication Sig Note  . calcium gluconate 500 MG tablet Take 1 tablet by mouth 2 (two) times daily.   . prochlorperazine (COMPAZINE) 10 MG tablet The day after chemo take 1 tab four times a day x 2 days. Then may take 1 tab four times a day if needed for nausea/vomiting. (Patient not taking: Reported on 03/12/2019) 07/24/2014: Just takes as needed   Facility-Administered Encounter  Medications as of 06/25/2019  Medication  . cyanocobalamin ((VITAMIN B-12)) injection 1,000 mcg  . denosumab (XGEVA) injection 120 mg    ALLERGIES:  No Known Allergies   PHYSICAL EXAM:  ECOG Performance status: 1  Vitals:   06/25/19 0830  BP: 95/66  Pulse: 85  Resp: 18  Temp: (!) 97.1 F (36.2 C)  SpO2: 98%   Filed Weights   06/25/19 0830  Weight: 164 lb (74.4 kg)    Physical Exam Constitutional:      Appearance: Normal appearance. He is normal weight.  Cardiovascular:     Rate and Rhythm: Normal rate and regular rhythm.     Heart sounds: Normal heart sounds.  Pulmonary:     Effort: Pulmonary effort is normal.     Breath sounds: Normal breath sounds.  Abdominal:     General: Bowel sounds are normal.     Palpations: Abdomen is soft.  Musculoskeletal:        General: Normal range of motion.  Skin:    General: Skin is warm and dry.  Neurological:     Mental Status: He is alert and oriented to person, place, and time. Mental status is at baseline.  Psychiatric:        Mood and Affect: Mood normal.        Behavior: Behavior normal.        Thought Content: Thought content normal.        Judgment: Judgment normal.      LABORATORY DATA:  I have reviewed the labs as listed.  CBC    Component Value Date/Time   WBC 6.3  06/25/2019 0815   RBC 3.84 (L) 06/25/2019 0815   HGB 11.9 (L) 06/25/2019 0815   HCT 37.3 (L) 06/25/2019 0815   PLT 304 06/25/2019 0815   MCV 97.1 06/25/2019 0815   MCH 31.0 06/25/2019 0815   MCHC 31.9 06/25/2019 0815   RDW 16.2 (H) 06/25/2019 0815   LYMPHSABS 1.8 06/25/2019 0815   MONOABS 0.7 06/25/2019 0815   EOSABS 0.0 06/25/2019 0815   BASOSABS 0.0 06/25/2019 0815   CMP Latest Ref Rng & Units 06/25/2019 06/04/2019 05/14/2019  Glucose 70 - 99 mg/dL 95 99 97  BUN 8 - 23 mg/dL 24(H) 16 18  Creatinine 0.61 - 1.24 mg/dL 2.32(H) 1.89(H) 1.62(H)  Sodium 135 - 145 mmol/L 134(L) 138 136  Potassium 3.5 - 5.1 mmol/L 5.1 4.7 4.3  Chloride 98 - 111 mmol/L 100 102 106  CO2 22 - 32 mmol/L '24 26 23  ' Calcium 8.9 - 10.3 mg/dL 9.1 9.1 8.9  Total Protein 6.5 - 8.1 g/dL 8.3(H) 8.0 7.6  Total Bilirubin 0.3 - 1.2 mg/dL 0.3 0.3 0.2(L)  Alkaline Phos 38 - 126 U/L 53 50 60  AST 15 - 41 U/L 24 29 38  ALT 0 - 44 U/L 19 21 35   I have reviewed scans and discussed with the patient.   ASSESSMENT & PLAN:   Malignant neoplasm of prostate ([place]) 1.  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 -Bone scan on 07/25/2017 showed mixed response compared to prior scan from 09/06/2016.  Last CT scan on 12/20/2016 not show any visceral metastasis. -Baseline PSAs between 0.7 and 0.9.  Latest PSA on 06/04/2019 was 1.13.  Previously it was 1.03. -I have reviewed his CBC today.  LFTs are normal. -She will proceed with his next cycle today.  I will reevaluate him in 3 weeks for follow-up.  2.  Renal insufficiency: -His baseline creatinine is around 1.89.  Today his creatinine went up to 2.32.  Potassium is also slightly high at 5.1. -We will give him 1 L of normal saline over 2 hours.  I have recommended to avoid potassium rich foods.  3.  Bone metastasis: -He is continuing denosumab monthly.  He will continue calcium supplements.  4.  Normocytic anemia: -This is  from CKD and chemotherapy.  Hemoglobin today is 11.9. -Last Feraheme infusion was on 03/12/2019.  5.  Vitamin D deficiency: -He will continue vitamin D 50,000 units weekly.     Orders placed this encounter:  Orders Placed This Encounter  Procedures  . CBC with Differential/Platelet  . Comprehensive metabolic panel  . PSA      Derek Jack, MD [place] [phone]

## 2019-06-25 NOTE — Patient Instructions (Addendum)
[facility] at [facility] Discharge Instructions  You were seen today by Dr. Katragadda. He went over your recent lab results. He will see you back in 3 weeks for labs, treatment and follow up.   Thank you for choosing  Cancer Center at [facility] to provide your oncology and hematology care.  To afford each patient quality time with our provider, please arrive at least 15 minutes before your scheduled appointment time.   If you have a lab appointment with the Cancer Center please come in thru the  Main Entrance and check in at the main information desk  You need to re-schedule your appointment should you arrive 10 or more minutes late.  We strive to give you quality time with our providers, and arriving late affects you and other patients whose appointments are after yours.  Also, if you no show three or more times for appointments you may be dismissed from the clinic at the providers discretion.     Again, thank you for choosing [facility].  Our hope is that these requests will decrease the amount of time that you wait before being seen by our physicians.       _____________________________________________________________  Should you have questions after your visit to  Cancer Center, please contact our office at [phone] between the hours of 8:00 a.m. and 4:30 p.m.  Voicemails left after 4:00 p.m. will not be returned until the following business day.  For prescription refill requests, have your pharmacy contact our office and allow 72 hours.    Cancer Center Support Programs:   > Cancer Support Group  2nd Tuesday of the month 1pm-2pm, Journey Room    

## 2019-06-25 NOTE — Patient Instructions (Signed)
[facility] Discharge Instructions for Patients Receiving Chemotherapy  Today you received the following chemotherapy agents   To help prevent nausea and vomiting after your treatment, we encourage you to take your nausea medication   If you develop nausea and vomiting that is not controlled by your nausea medication, call the clinic.   BELOW ARE SYMPTOMS THAT SHOULD BE REPORTED IMMEDIATELY:  *FEVER GREATER THAN 100.5 F  *CHILLS WITH OR WITHOUT FEVER  NAUSEA AND VOMITING THAT IS NOT CONTROLLED WITH YOUR NAUSEA MEDICATION  *UNUSUAL SHORTNESS OF BREATH  *UNUSUAL BRUISING OR BLEEDING  TENDERNESS IN MOUTH AND THROAT WITH OR WITHOUT PRESENCE OF ULCERS  *URINARY PROBLEMS  *BOWEL PROBLEMS  UNUSUAL RASH Items with * indicate a potential emergency and should be followed up as soon as possible.  Feel free to call the clinic should you have any questions or concerns. The clinic phone number is [phone].  Please show the CHEMO ALERT CARD at check-in to the Emergency Department and triage nurse.   

## 2019-06-25 NOTE — Progress Notes (Signed)
Patient presents today for treatment and follow up appointment with Dr.Katragadda. Labs pending.  MAR reviewed. Patient has no complaints of any changes since the last visit. Patient denies any pain today.   Creatinine today 2.32. Vital signs within parameters for treatment.   Message received from [facility] LPN/ Dr. Delton Coombes. Proceed with treatment. Infuse 1 liter of normal saline over 2 hours.   Treatment given today per MD orders. Tolerated infusion without adverse affects. Vital signs stable. No complaints at this time. Discharged from clinic ambulatory. F/U with [facility] as scheduled.

## 2019-07-12 NOTE — Progress Notes (Signed)
.  Pharmacist Chemotherapy Monitoring - Follow Up Assessment    I verify that I have reviewed each item in the below checklist:  . Regimen for the patient is scheduled for the appropriate day and plan matches scheduled date. [name] Kitchen Appropriate non-routine labs are ordered dependent on drug ordered. . If applicable, additional medications reviewed and ordered per protocol based on lifetime cumulative doses and/or treatment regimen.   Plan for follow-up and/or issues identified: No . I-vent associated with next due treatment: No . MD and/or nursing notified: No  . Xgeva + B-12 also.  Wynona Neat 07/12/2019 2:45 PM

## 2019-07-16 ENCOUNTER — Inpatient Hospital Stay (HOSPITAL_COMMUNITY): Payer: Medicare HMO

## 2019-07-16 ENCOUNTER — Inpatient Hospital Stay (HOSPITAL_BASED_OUTPATIENT_CLINIC_OR_DEPARTMENT_OTHER): Payer: Medicare HMO | Admitting: Hematology

## 2019-07-16 ENCOUNTER — Encounter (HOSPITAL_COMMUNITY): Payer: Self-pay | Admitting: Hematology

## 2019-07-16 ENCOUNTER — Other Ambulatory Visit: Payer: Self-pay

## 2019-07-16 VITALS — BP 95/66 | HR 64 | Temp 96.4°F | Resp 17 | Wt 164.2 lb

## 2019-07-16 VITALS — BP 97/69 | HR 66 | Temp 97.3°F | Resp 18

## 2019-07-16 DIAGNOSIS — M899 Disorder of bone, unspecified: Secondary | ICD-10-CM

## 2019-07-16 DIAGNOSIS — C7951 Secondary malignant neoplasm of bone: Secondary | ICD-10-CM | POA: Diagnosis not present

## 2019-07-16 DIAGNOSIS — E875 Hyperkalemia: Secondary | ICD-10-CM

## 2019-07-16 DIAGNOSIS — Z5189 Encounter for other specified aftercare: Secondary | ICD-10-CM | POA: Diagnosis not present

## 2019-07-16 DIAGNOSIS — Z5111 Encounter for antineoplastic chemotherapy: Secondary | ICD-10-CM | POA: Diagnosis not present

## 2019-07-16 DIAGNOSIS — N189 Chronic kidney disease, unspecified: Secondary | ICD-10-CM | POA: Diagnosis not present

## 2019-07-16 DIAGNOSIS — D631 Anemia in chronic kidney disease: Secondary | ICD-10-CM | POA: Diagnosis not present

## 2019-07-16 DIAGNOSIS — E559 Vitamin D deficiency, unspecified: Secondary | ICD-10-CM | POA: Diagnosis not present

## 2019-07-16 DIAGNOSIS — E538 Deficiency of other specified B group vitamins: Secondary | ICD-10-CM

## 2019-07-16 DIAGNOSIS — C61 Malignant neoplasm of prostate: Secondary | ICD-10-CM

## 2019-07-16 LAB — COMPREHENSIVE METABOLIC PANEL
ALT: 20 U/L (ref 0–44)
AST: 30 U/L (ref 15–41)
Albumin: 4 g/dL (ref 3.5–5.0)
Alkaline Phosphatase: 56 U/L (ref 38–126)
Anion gap: 8 (ref 5–15)
BUN: 25 mg/dL — ABNORMAL HIGH (ref 8–23)
CO2: 25 mmol/L (ref 22–32)
Calcium: 8.8 mg/dL — ABNORMAL LOW (ref 8.9–10.3)
Chloride: 101 mmol/L (ref 98–111)
Creatinine, Ser: 2.17 mg/dL — ABNORMAL HIGH (ref 0.61–1.24)
GFR calc Af Amer: 34 mL/min — ABNORMAL LOW (ref >60)
GFR calc non Af Amer: 29 mL/min — ABNORMAL LOW (ref >60)
Glucose, Bld: 101 mg/dL — ABNORMAL HIGH (ref 70–99)
Potassium: 5.4 mmol/L — ABNORMAL HIGH (ref 3.5–5.1)
Sodium: 134 mmol/L — ABNORMAL LOW (ref 135–145)
Total Bilirubin: 0.4 mg/dL (ref 0.3–1.2)
Total Protein: 8.3 g/dL — ABNORMAL HIGH (ref 6.5–8.1)

## 2019-07-16 LAB — CBC WITH DIFFERENTIAL/PLATELET
Abs Immature Granulocytes: 0.02 10*3/uL (ref 0.00–0.07)
Basophils Absolute: 0 10*3/uL (ref 0.0–0.1)
Basophils Relative: 0 %
Eosinophils Absolute: 0 10*3/uL (ref 0.0–0.5)
Eosinophils Relative: 0 %
HCT: 33.6 % — ABNORMAL LOW (ref 39.0–52.0)
Hemoglobin: 10.6 g/dL — ABNORMAL LOW (ref 13.0–17.0)
Immature Granulocytes: 0 %
Lymphocytes Relative: 30 %
Lymphs Abs: 1.6 10*3/uL (ref 0.7–4.0)
MCH: 30.6 pg (ref 26.0–34.0)
MCHC: 31.5 g/dL (ref 30.0–36.0)
MCV: 97.1 fL (ref 80.0–100.0)
Monocytes Absolute: 0.9 10*3/uL (ref 0.1–1.0)
Monocytes Relative: 18 %
Neutro Abs: 2.7 10*3/uL (ref 1.7–7.7)
Neutrophils Relative %: 52 %
Platelets: 341 10*3/uL (ref 150–400)
RBC: 3.46 MIL/uL — ABNORMAL LOW (ref 4.22–5.81)
RDW: 16.4 % — ABNORMAL HIGH (ref 11.5–15.5)
WBC: 5.2 10*3/uL (ref 4.0–10.5)
nRBC: 0 % (ref 0.0–0.2)

## 2019-07-16 LAB — PSA: Prostatic Specific Antigen: 1.06 ng/mL (ref 0.00–4.00)

## 2019-07-16 MED ORDER — SODIUM CHLORIDE 0.9 % IV SOLN: Freq: Once | INTRAVENOUS | Status: AC

## 2019-07-16 MED ORDER — HEPARIN SOD (PORK) LOCK FLUSH 100 UNIT/ML IV SOLN
500.0000 [IU] | Freq: Once | INTRAVENOUS | Status: AC | PRN
  Administered 2019-07-16: 500 [IU]

## 2019-07-16 MED ORDER — SODIUM CHLORIDE 0.9 % IV SOLN
20.0000 mg/m2 | Freq: Once | INTRAVENOUS | Status: AC
  Administered 2019-07-16: 13:00:00 41 mg via INTRAVENOUS
  Filled 2019-07-16: qty 4.1

## 2019-07-16 MED ORDER — SODIUM CHLORIDE 0.9 % IV SOLN
10.0000 mg | Freq: Once | INTRAVENOUS | Status: AC
  Administered 2019-07-16: 10 mg via INTRAVENOUS
  Filled 2019-07-16: qty 10

## 2019-07-16 MED ORDER — PEGFILGRASTIM 6 MG/0.6ML ~~LOC~~ PSKT
6.0000 mg | PREFILLED_SYRINGE | Freq: Once | SUBCUTANEOUS | Status: AC
  Administered 2019-07-16: 14:00:00 6 mg via SUBCUTANEOUS
  Filled 2019-07-16: qty 0.6

## 2019-07-16 MED ORDER — DIPHENHYDRAMINE HCL 50 MG/ML IJ SOLN
25.0000 mg | Freq: Once | INTRAMUSCULAR | Status: AC
  Administered 2019-07-16: 12:00:00 25 mg via INTRAVENOUS
  Filled 2019-07-16: qty 1

## 2019-07-16 MED ORDER — SODIUM POLYSTYRENE SULFONATE 15 GM/60ML PO SUSP
60.0000 g | Freq: Once | ORAL | Status: AC
  Administered 2019-07-16: 60 g via ORAL
  Filled 2019-07-16: qty 240

## 2019-07-16 MED ORDER — SODIUM POLYSTYRENE SULFONATE 15 GM/60ML PO SUSP
60.0000 g | Freq: Once | ORAL | Status: DC
  Administered 2019-07-16: 14:00:00 60 g via ORAL

## 2019-07-16 MED ORDER — SODIUM CHLORIDE 0.9% FLUSH: 10.0000 mL | INTRAVENOUS | Status: DC | PRN

## 2019-07-16 MED ORDER — FAMOTIDINE IN NACL 20-0.9 MG/50ML-% IV SOLN
20.0000 mg | Freq: Once | INTRAVENOUS | Status: AC
  Administered 2019-07-16: 12:00:00 20 mg via INTRAVENOUS
  Filled 2019-07-16: qty 50

## 2019-07-16 MED ORDER — DENOSUMAB 120 MG/1.7ML ~~LOC~~ SOLN
120.0000 mg | Freq: Once | SUBCUTANEOUS | Status: AC
  Administered 2019-07-16: 120 mg via SUBCUTANEOUS
  Filled 2019-07-16: qty 1.7

## 2019-07-16 MED ORDER — CYANOCOBALAMIN 1000 MCG/ML IJ SOLN
1000.0000 ug | Freq: Once | INTRAMUSCULAR | Status: AC
  Administered 2019-07-16: 13:00:00 1000 ug via INTRAMUSCULAR
  Filled 2019-07-16: qty 1

## 2019-07-16 NOTE — Assessment & Plan Note (Signed)
1.  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 -Bone scan on 07/25/2017 showed mixed response compared to prior scan from 09/06/2016. -Baseline PSA between 0.7-0.9. -Last PSA on 06/04/2019 was 1.13.  PSA on 06/25/2019 was 1.27. -I have reviewed labs including white count and LFTs which are normal. -I have recommended proceeding with his treatment today.  PSA will be followed up from today.  I have recommended doing a bone scan and a CT scan of the abdomen and pelvis without contrast prior to next visit.  2.  Renal insufficiency: -Baseline creatinine is around 1.8.  Today creatinine is 2.17, slightly improved from last time at 2.32. -He also has mild hyperkalemia at 5.4. -We will give him Kayexalate 60 g today.  3.  Bone metastasis: -he will continue denosumab monthly.  He will continue calcium supplements.  4.  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  Hemoglobin today is 10.6. -Last Feraheme infusion was on 03/12/2019.  We plan to check his ferritin and iron panel soon.  5.  Vitamin D deficiency: -He will continue vitamin D 50,000 units weekly.

## 2019-07-16 NOTE — Progress Notes (Signed)
Patient has been assessed, vital signs and labs have been reviewed by Dr. Delton Coombes. ANC, Creatinine, LFTs, and Platelets are within treatment parameters, due to potassium being elevated please give Kayexalate 60 grams prior to leaving today per Dr. Delton Coombes. The patient is good to proceed with treatment at this time.

## 2019-07-16 NOTE — Progress Notes (Signed)
Patient tolerated chemotherapy with no complaints voiced.  Port site clean and dry with no bruising or swelling noted at site.  Good blood return noted before and after administration of chemotherapy.  Band aid applied.  Patient left ambulatory with VSS and no s/s of distress noted.  

## 2019-07-16 NOTE — Progress Notes (Signed)
[place], [place] [address]   CLINIC:  Medical Oncology/Hematology  PCP:  Holley Bouche, NP (Inactive) [place] [address] [phone]   REASON FOR VISIT: Follow-up for prostate cancer  CURRENT THERAPY:  Cabazitaxel every 3 weeks   BRIEF ONCOLOGIC HISTORY:  Oncology History  Malignant neoplasm of prostate ([place])  01/26/2014 Tumor Marker   PSA > 5000   01/28/2014 Initial Biopsy   Metastatic adenocarcinoma of prostate   02/05/2014 Surgery   Bilateral orchiectomy by Dr. Junious Silk   02/26/2014 Tumor Marker   PSA = 399.5   03/14/2014 Tumor Marker   PSA= 89.51   03/14/2014 - 06/26/2014 Chemotherapy   Docetaxel 75 mg/kg every 21 days x 6 cycles   04/24/2014 Tumor Marker   PSA- 19.89   07/23/2014 Procedure   Nephrostomy tube removed by IR, Dr. Geroge Baseman   07/24/2014 Tumor Marker   PSA= 6.15   10/16/2014 Tumor Marker   PSA: 3.54    01/08/2015 Tumor Marker   PSA: 2.13    01/17/2015 Imaging   CT CAP-  Massive pelvic and retroperitoneal lymphadenopathy noted on the prior study has nearly completely resolved, now with only a small amount of residual amorphous soft tissue predominantly around the the infrarenal abdominal aorta.   01/17/2015 Imaging   Bone scan- Widespread osseous metastatic disease with multiple foci of increased activity throughout the skeleton, corresponding with the findings on the prior CT from October, 2015.   04/11/2015 Tumor Marker   PSA: 1.87    07/21/2015 Imaging   Bone scan- Bony metastatic disease again noted at multiple sites, stable from prior study. No progression of bony metastatic disease is demonstrable on this study.   07/25/2015 Imaging   CT CAP- Stable matted soft tissue density in the retroperitoneum and extraperitoneal pelvis consistent with treated disease. No recurrent lymphadenopathy.  No pulmonary metastatic disease. Diffuse stable sclerotic metastatic bone disease.     01/14/2016 Imaging   Bone scan-  Multiple sites of abnormal increased tracer localization involving BILATERAL ribs, T11, T12, questionably RIGHT scapula and LEFT iliac bone suspicious for osseous metastases.   01/23/2016 Imaging   CT CAP- 1. Similar widespread osseous metastasis. 2. Similar soft tissue thickening within the retroperitoneum of the abdomen. Improved left pelvic side wall soft tissue thickening. These are consistent with sites of treated disease. No well-defined adenopathy. 3. No new sites of disease.   05/03/2016 -  Chemotherapy   Jevtana every 21 days    05/03/2016 -  Chemotherapy   The patient had pegfilgrastim (NEULASTA) injection 6 mg, 6 mg, Subcutaneous,  Once, 1 of 1 cycle Administration: 6 mg (05/05/2016) pegfilgrastim (NEULASTA ONPRO KIT) injection 6 mg, 6 mg, Subcutaneous, Once, 49 of 49 cycles Administration: 6 mg (05/24/2016), 6 mg (06/14/2016), 6 mg (07/07/2016), 6 mg (07/28/2016), 6 mg (08/18/2016), 6 mg (09/08/2016), 6 mg (09/29/2016), 6 mg (10/20/2016), 6 mg (11/10/2016), 6 mg (02/14/2017), 6 mg (03/07/2017), 6 mg (03/28/2017), 6 mg (04/20/2017), 6 mg (05/11/2017), 6 mg (06/27/2017), 6 mg (08/30/2017), 6 mg (09/20/2017), 6 mg (10/11/2017), 6 mg (11/02/2017), 6 mg (08/09/2017), 6 mg (11/23/2017), 6 mg (12/14/2017), 6 mg (01/09/2018), 6 mg (01/31/2018), 6 mg (04/04/2018), 6 mg (04/27/2018), 6 mg (05/18/2018), 6 mg (06/08/2018), 6 mg (06/29/2018), 6 mg (07/20/2018), 6 mg (08/10/2018), 6 mg (09/01/2018), 6 mg (09/22/2018), 6 mg (10/13/2018), 6 mg (11/03/2018), 6 mg (11/24/2018), 6 mg (12/15/2018), 6 mg (01/05/2019), 6 mg (01/26/2019), 6 mg (02/16/2019), 6 mg (03/12/2019), 6 mg (  04/02/2019), 6 mg (04/23/2019), 6 mg (05/14/2019), 6 mg (06/04/2019), 6 mg (06/25/2019), 6 mg (07/16/2019) cabazitaxel (JEVTANA) 41 mg in dextrose 5 % 250 mL chemo infusion, 20 mg/m2 = 41 mg (100 % of original dose 20 mg/m2), Intravenous,  Once, 50 of 50 cycles Dose modification: 20 mg/m2 (original dose 20 mg/m2, Cycle 1, Reason: Dose not  tolerated, Comment: recommended by Data to use 68m/m), 20 mg/m2 (original dose 20 mg/m2, Cycle 37, Reason: Other (see comments), Comment: Changed to new orderable), 21 mg/m2 (original dose 20 mg/m2, Cycle 40, Reason: Other (see comments)), 20 mg/m2 (original dose 20 mg/m2, Cycle 40, Reason: Other (see comments)) Administration: 41 mg (05/03/2016), 41 mg (05/24/2016), 41 mg (06/14/2016), 41 mg (07/07/2016), 41 mg (07/28/2016), 41 mg (08/18/2016), 41 mg (09/08/2016), 41 mg (09/29/2016), 41 mg (10/20/2016), 41 mg (11/10/2016), 41 mg (02/14/2017), 41 mg (03/07/2017), 41 mg (03/28/2017), 41 mg (04/20/2017), 41 mg (05/11/2017), 41 mg (06/27/2017), 41 mg (08/30/2017), 41 mg (09/20/2017), 41 mg (10/11/2017), 41 mg (11/02/2017), 41 mg (08/09/2017), 41 mg (11/23/2017), 41 mg (12/14/2017), 41 mg (01/09/2018), 41 mg (01/31/2018), 41 mg (02/21/2018), 41 mg (04/04/2018), 41 mg (04/27/2018), 41 mg (05/18/2018), 41 mg (06/08/2018), 41 mg (06/29/2018), 41 mg (07/20/2018), 41 mg (08/10/2018), 41 mg (09/01/2018), 41 mg (09/22/2018), 41 mg (10/13/2018), 41 mg (11/03/2018), 41 mg (11/24/2018), 41 mg (12/15/2018), 41 mg (01/05/2019), 41 mg (01/26/2019), 41 mg (02/16/2019), 41 mg (03/12/2019), 41 mg (04/02/2019), 41 mg (04/23/2019), 41 mg (05/14/2019), 41 mg (06/04/2019), 41 mg (06/25/2019), 41 mg (07/16/2019)  for chemotherapy treatment.    09/06/2016 Imaging   Restaging CT C/A/P: IMPRESSION: 1. Similar appearance of widespread sclerotic bone metastases. 2. No change in soft tissue thickening within the retroperitoneum and left pelvic sidewall. No well defined adenopathy identified. 3. No new sites of disease 4. Aortic Atherosclerosis (ICD10-I70.0). Coronary artery calcifications noted. 5. 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 6. Stable 9 mm pancreatic cystic lesion. Favor pseudocyst. Indolent neoplasm may look similar. Attention on follow-up imaging.   09/06/2016 Imaging   Bone Scan: IMPRESSION: In this  patient with known diffuse sclerotic metastatic disease, bone scan does not reveal progression of radiotracer uptake. Several of the areas of previously demonstrated radiotracer uptake appear less prominent.  Change in appearance of radiotracer uptake involving the mandible now more notable on the right and previously more notable on the left may reflect result of dental disease rather than metastatic disease.    04/08/2017 Tumor Marker   PSA 0.89     INTERVAL HISTORY:  [name]89y.o. male seen for follow-up and assessment prior to next cycle of cabazitaxel for his metastatic prostate cancer.  Denies any tingling or numbness extremities.  Appetite is 100%.  Energy levels are 75%.  No new onset pains reported.  Denies any fevers or infections.  No ER visits or hospitalizations.  REVIEW OF SYSTEMS:  Review of Systems  All other systems reviewed and are negative.    PAST MEDICAL/SURGICAL HISTORY:  Past Medical History:  Diagnosis Date  . Acute renal failure ([facility] 01/26/2014  . Alcohol abuse   . Anemia of chronic disease 01/26/2014  . Bilateral hydronephrosis 01/26/2014  . History of cocaine abuse ([facility] 01/26/2014  . Homelessness 01/31/2014  . Prostate cancer metastatic to multiple sites ([facility] 01/30/2014  . Sepsis ([facility]   . UTI (lower urinary tract infection)    Past Surgical History:  Procedure Laterality Date  . ORCHIECTOMY Bilateral 02/05/2014   Procedure: BILATERAL ORCHIECTOMY;  Surgeon: MFestus Aloe  MD;  Location: [facility];  Service: Urology;  Laterality: Bilateral;  . PERCUTANEOUS NEPHROSTOMY Bilateral    IR Dr. Junious Silk changed on 04/22/2014  . PORT-A-CATH REMOVAL Right 08/08/2017   Procedure: REMOVAL PORT-A-CATH RIGHT CHEST (PROCEDURE #2);  Surgeon: Aviva Signs, MD;  Location: [facility];  Service: General;  Laterality: Right;  . PORTACATH PLACEMENT Right 03/12/14  . PORTACATH PLACEMENT Left 08/08/2017   Procedure: INSERTION POWER PORT WITH ATTACHED CATHETER LEFT  SUBCLAVIAN (PROCEDURE #1);  Surgeon: Aviva Signs, MD;  Location: [facility];  Service: General;  Laterality: Left;     SOCIAL HISTORY:  Social History   Socioeconomic History  . Marital status: Single    Spouse name: Not on file  . Number of children: 1  . Years of education: 9th  . Highest education level: Not on file  Occupational History  . Occupation: disabilty    Employer: [employer]  Tobacco Use  . Smoking status: Current Every Day Smoker    Packs/day: 0.25    Years: 55.00    Pack years: 13.75    Types: Cigarettes  . Smokeless tobacco: Never Used  Substance and Sexual Activity  . Alcohol use: Yes    Alcohol/week: 1.0 standard drinks    Types: 1 Cans of beer per week  . Drug use: Yes    Types: Cocaine    Comment: last used Cocaine 01/25/14  . Sexual activity: Yes  Other Topics Concern  . Not on file  Social History Narrative   Lives at [employer] 9th grade   1 son, lives in [place], [place]   Can read and write in native language            Social Determinants of Health   Financial Resource Strain:   . Difficulty of Paying Living Expenses:   Food Insecurity:   . Worried About Charity fundraiser in the Last Year:   . Arboriculturist in the Last Year:   Transportation Needs:   . Film/video editor (Medical):   [name] Kitchen Lack of Transportation (Non-Medical):   Physical Activity:   . Days of Exercise per Week:   . Minutes of Exercise per Session:   Stress:   . Feeling of Stress :   Social Connections:   . Frequency of Communication with Friends and Family:   . Frequency of Social Gatherings with Friends and Family:   . Attends Religious Services:   . Active Member of Clubs or Organizations:   . Attends Archivist Meetings:   [name] Kitchen Marital Status:   Intimate Partner Violence:   . Fear of Current or Ex-Partner:   . Emotionally Abused:   [name] Kitchen Physically Abused:   . Sexually Abused:     FAMILY HISTORY:  Family History  Problem  Relation Age of Onset  . Cancer Brother 64    CURRENT MEDICATIONS:  Outpatient Encounter Medications as of 07/16/2019  Medication Sig Note  . calcium gluconate 500 MG tablet Take 1 tablet by mouth 2 (two) times daily.   . prochlorperazine (COMPAZINE) 10 MG tablet The day after chemo take 1 tab four times a day x 2 days. Then may take 1 tab four times a day if needed for nausea/vomiting. (Patient not taking: Reported on 03/12/2019) 07/24/2014: Just takes as needed   Facility-Administered Encounter Medications as of 07/16/2019  Medication  . cyanocobalamin ((VITAMIN B-12)) injection 1,000 mcg  . denosumab (XGEVA) injection 120 mg    ALLERGIES:  No  Known Allergies   PHYSICAL EXAM:  ECOG Performance status: 1  Vitals:   07/16/19 1003  BP: 95/66  Pulse: 64  Resp: 17  Temp: (!) 96.4 F (35.8 C)  SpO2: 98%   Filed Weights   07/16/19 1003  Weight: 164 lb 3.2 oz (74.5 kg)    Physical Exam Constitutional:      Appearance: Normal appearance. He is normal weight.  Cardiovascular:     Rate and Rhythm: Normal rate and regular rhythm.     Heart sounds: Normal heart sounds.  Pulmonary:     Effort: Pulmonary effort is normal.     Breath sounds: Normal breath sounds.  Abdominal:     General: Bowel sounds are normal.     Palpations: Abdomen is soft.  Musculoskeletal:        General: Normal range of motion.  Skin:    General: Skin is warm and dry.  Neurological:     Mental Status: He is alert and oriented to person, place, and time. Mental status is at baseline.  Psychiatric:        Mood and Affect: Mood normal.        Behavior: Behavior normal.        Thought Content: Thought content normal.        Judgment: Judgment normal.      LABORATORY DATA:  I have reviewed the labs as listed.  CBC    Component Value Date/Time   WBC 5.2 07/16/2019 0809   RBC 3.46 (L) 07/16/2019 0809   HGB 10.6 (L) 07/16/2019 0809   HCT 33.6 (L) 07/16/2019 0809   PLT 341 07/16/2019 0809   MCV  97.1 07/16/2019 0809   MCH 30.6 07/16/2019 0809   MCHC 31.5 07/16/2019 0809   RDW 16.4 (H) 07/16/2019 0809   LYMPHSABS 1.6 07/16/2019 0809   MONOABS 0.9 07/16/2019 0809   EOSABS 0.0 07/16/2019 0809   BASOSABS 0.0 07/16/2019 0809   CMP Latest Ref Rng & Units 07/16/2019 06/25/2019 06/04/2019  Glucose 70 - 99 mg/dL 101(H) 95 99  BUN 8 - 23 mg/dL 25(H) 24(H) 16  Creatinine 0.61 - 1.24 mg/dL 2.17(H) 2.32(H) 1.89(H)  Sodium 135 - 145 mmol/L 134(L) 134(L) 138  Potassium 3.5 - 5.1 mmol/L 5.4(H) 5.1 4.7  Chloride 98 - 111 mmol/L 101 100 102  CO2 22 - 32 mmol/L '25 24 26  ' Calcium 8.9 - 10.3 mg/dL 8.8(L) 9.1 9.1  Total Protein 6.5 - 8.1 g/dL 8.3(H) 8.3(H) 8.0  Total Bilirubin 0.3 - 1.2 mg/dL 0.4 0.3 0.3  Alkaline Phos 38 - 126 U/L 56 53 50  AST 15 - 41 U/L '30 24 29  ' ALT 0 - 44 U/L '20 19 21   ' I have reviewed scans and discussed with the patient.   ASSESSMENT & PLAN:   Malignant neoplasm of prostate ([place]) 1.  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 -Bone scan on 07/25/2017 showed mixed response compared to prior scan from 09/06/2016. -Baseline PSA between 0.7-0.9. -Last PSA on 06/04/2019 was 1.13.  PSA on 06/25/2019 was 1.27. -I have reviewed labs including white count and LFTs which are normal. -I have recommended proceeding with his treatment today.  PSA will be followed up from today.  I have recommended doing a bone scan and a CT scan of the abdomen and pelvis without contrast prior to next visit.  2.  Renal insufficiency: -Baseline creatinine is around 1.8.  Today creatinine is 2.17, slightly improved from  last time at 2.32. -He also has mild hyperkalemia at 5.4. -We will give him Kayexalate 60 g today.  3.  Bone metastasis: -he will continue denosumab monthly.  He will continue calcium supplements.  4.  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  Hemoglobin today is 10.6. -Last Feraheme infusion was on  03/12/2019.  We plan to check his ferritin and iron panel soon.  5.  Vitamin D deficiency: -He will continue vitamin D 50,000 units weekly.     Orders placed this encounter:  Orders Placed This Encounter  Procedures  . CT Abdomen Pelvis Wo Contrast  . CT Chest Wo Contrast  . NM Bone Scan Whole Body      Derek Jack, MD [place] [phone]

## 2019-07-16 NOTE — Patient Instructions (Addendum)
[facility] at [facility] Discharge Instructions  You were seen today by Dr. Katragadda. He went over your recent lab results. He will schedule you for repeat scans prior to your next visit. He will see you back in 3 weeks for labs, treatment and follow up.   Thank you for choosing [facility] at [facility] to provide your oncology and hematology care.  To afford each patient quality time with our provider, please arrive at least 15 minutes before your scheduled appointment time.   If you have a lab appointment with the Cancer Center please come in thru the  Main Entrance and check in at the main information desk  You need to re-schedule your appointment should you arrive 10 or more minutes late.  We strive to give you quality time with our providers, and arriving late affects you and other patients whose appointments are after yours.  Also, if you no show three or more times for appointments you may be dismissed from the clinic at the providers discretion.     Again, thank you for choosing [facility].  Our hope is that these requests will decrease the amount of time that you wait before being seen by our physicians.       _____________________________________________________________  Should you have questions after your visit to [facility], please contact our office at [phone] between the hours of 8:00 a.m. and 4:30 p.m.  Voicemails left after 4:00 p.m. will not be returned until the following business day.  For prescription refill requests, have your pharmacy contact our office and allow 72 hours.    Cancer Center Support Programs:   > Cancer Support Group  2nd Tuesday of the month 1pm-2pm, Journey Room    

## 2019-07-31 NOTE — Progress Notes (Signed)

## 2019-08-03 ENCOUNTER — Ambulatory Visit (HOSPITAL_COMMUNITY)
Admission: RE | Admit: 2019-08-03 | Discharge: 2019-08-03 | Disposition: A | Discharge status: Home / Self Care | Payer: Medicare Other | Source: Ambulatory Visit | Attending: Hematology | Admitting: Hematology

## 2019-08-03 ENCOUNTER — Other Ambulatory Visit: Payer: Self-pay

## 2019-08-03 ENCOUNTER — Encounter (HOSPITAL_COMMUNITY): Payer: Self-pay

## 2019-08-03 ENCOUNTER — Encounter (HOSPITAL_COMMUNITY)
Admission: RE | Admit: 2019-08-03 | Discharge: 2019-08-03 | Disposition: A | Discharge status: Home / Self Care | Payer: Medicare Other | Source: Ambulatory Visit | Attending: Hematology | Admitting: Hematology

## 2019-08-03 DIAGNOSIS — C61 Malignant neoplasm of prostate: Secondary | ICD-10-CM | POA: Diagnosis present

## 2019-08-03 MED ORDER — TECHNETIUM TC 99M MEDRONATE IV KIT
20.0000 | PACK | Freq: Once | INTRAVENOUS | Status: AC | PRN
  Administered 2019-08-03: 20.4 via INTRAVENOUS

## 2019-08-06 ENCOUNTER — Ambulatory Visit (HOSPITAL_COMMUNITY): Payer: Medicare HMO

## 2019-08-06 ENCOUNTER — Inpatient Hospital Stay (HOSPITAL_COMMUNITY): Payer: Medicare Other

## 2019-08-06 ENCOUNTER — Ambulatory Visit (HOSPITAL_COMMUNITY): Payer: Medicare HMO | Admitting: Hematology

## 2019-08-16 ENCOUNTER — Encounter (HOSPITAL_COMMUNITY): Payer: Self-pay | Admitting: Hematology

## 2019-08-16 ENCOUNTER — Inpatient Hospital Stay (HOSPITAL_BASED_OUTPATIENT_CLINIC_OR_DEPARTMENT_OTHER): Payer: Medicare Other | Admitting: Hematology

## 2019-08-16 ENCOUNTER — Inpatient Hospital Stay (HOSPITAL_COMMUNITY): Payer: Medicare Other | Attending: Hematology

## 2019-08-16 ENCOUNTER — Other Ambulatory Visit: Payer: Self-pay

## 2019-08-16 ENCOUNTER — Inpatient Hospital Stay (HOSPITAL_COMMUNITY): Payer: Medicare Other

## 2019-08-16 VITALS — BP 120/72 | HR 74 | Temp 97.0°F | Resp 18

## 2019-08-16 DIAGNOSIS — C61 Malignant neoplasm of prostate: Secondary | ICD-10-CM | POA: Diagnosis present

## 2019-08-16 DIAGNOSIS — Z5111 Encounter for antineoplastic chemotherapy: Secondary | ICD-10-CM | POA: Insufficient documentation

## 2019-08-16 DIAGNOSIS — E538 Deficiency of other specified B group vitamins: Secondary | ICD-10-CM

## 2019-08-16 DIAGNOSIS — Z5189 Encounter for other specified aftercare: Secondary | ICD-10-CM | POA: Insufficient documentation

## 2019-08-16 DIAGNOSIS — M899 Disorder of bone, unspecified: Secondary | ICD-10-CM

## 2019-08-16 LAB — CBC WITH DIFFERENTIAL/PLATELET
Abs Immature Granulocytes: 0.01 10*3/uL (ref 0.00–0.07)
Basophils Absolute: 0 10*3/uL (ref 0.0–0.1)
Basophils Relative: 0 %
Eosinophils Absolute: 0.1 10*3/uL (ref 0.0–0.5)
Eosinophils Relative: 1 %
HCT: 33.7 % — ABNORMAL LOW (ref 39.0–52.0)
Hemoglobin: 10.5 g/dL — ABNORMAL LOW (ref 13.0–17.0)
Immature Granulocytes: 0 %
Lymphocytes Relative: 23 %
Lymphs Abs: 1.2 10*3/uL (ref 0.7–4.0)
MCH: 30.8 pg (ref 26.0–34.0)
MCHC: 31.2 g/dL (ref 30.0–36.0)
MCV: 98.8 fL (ref 80.0–100.0)
Monocytes Absolute: 0.6 10*3/uL (ref 0.1–1.0)
Monocytes Relative: 12 %
Neutro Abs: 3.4 10*3/uL (ref 1.7–7.7)
Neutrophils Relative %: 64 %
Platelets: 309 10*3/uL (ref 150–400)
RBC: 3.41 MIL/uL — ABNORMAL LOW (ref 4.22–5.81)
RDW: 17.4 % — ABNORMAL HIGH (ref 11.5–15.5)
WBC: 5.3 10*3/uL (ref 4.0–10.5)
nRBC: 0 % (ref 0.0–0.2)

## 2019-08-16 LAB — COMPREHENSIVE METABOLIC PANEL
ALT: 18 U/L (ref 0–44)
AST: 27 U/L (ref 15–41)
Albumin: 3.8 g/dL (ref 3.5–5.0)
Alkaline Phosphatase: 50 U/L (ref 38–126)
Anion gap: 9 (ref 5–15)
BUN: 17 mg/dL (ref 8–23)
CO2: 24 mmol/L (ref 22–32)
Calcium: 8.4 mg/dL — ABNORMAL LOW (ref 8.9–10.3)
Chloride: 103 mmol/L (ref 98–111)
Creatinine, Ser: 1.77 mg/dL — ABNORMAL HIGH (ref 0.61–1.24)
GFR calc Af Amer: 44 mL/min — ABNORMAL LOW (ref >60)
GFR calc non Af Amer: 38 mL/min — ABNORMAL LOW (ref >60)
Glucose, Bld: 87 mg/dL (ref 70–99)
Potassium: 4.5 mmol/L (ref 3.5–5.1)
Sodium: 136 mmol/L (ref 135–145)
Total Bilirubin: 0.4 mg/dL (ref 0.3–1.2)
Total Protein: 7.6 g/dL (ref 6.5–8.1)

## 2019-08-16 LAB — PSA: Prostatic Specific Antigen: 1.09 ng/mL (ref 0.00–4.00)

## 2019-08-16 MED ORDER — SODIUM CHLORIDE 0.9 % IV SOLN: Freq: Once | INTRAVENOUS | Status: AC

## 2019-08-16 MED ORDER — HEPARIN SOD (PORK) LOCK FLUSH 100 UNIT/ML IV SOLN
500.0000 [IU] | Freq: Once | INTRAVENOUS | Status: AC | PRN
  Administered 2019-08-16: 500 [IU]

## 2019-08-16 MED ORDER — SODIUM CHLORIDE 0.9% FLUSH
10.0000 mL | INTRAVENOUS | Status: DC | PRN
  Administered 2019-08-16: 10 mL

## 2019-08-16 MED ORDER — SODIUM CHLORIDE 0.9 % IV SOLN
20.0000 mg/m2 | Freq: Once | INTRAVENOUS | Status: AC
  Administered 2019-08-16: 41 mg via INTRAVENOUS
  Filled 2019-08-16: qty 4.1

## 2019-08-16 MED ORDER — CYANOCOBALAMIN 1000 MCG/ML IJ SOLN
1000.0000 ug | Freq: Once | INTRAMUSCULAR | Status: AC
  Administered 2019-08-16: 1000 ug via INTRAMUSCULAR
  Filled 2019-08-16: qty 1

## 2019-08-16 MED ORDER — PEGFILGRASTIM 6 MG/0.6ML ~~LOC~~ PSKT
6.0000 mg | PREFILLED_SYRINGE | Freq: Once | SUBCUTANEOUS | Status: AC
  Administered 2019-08-16: 6 mg via SUBCUTANEOUS
  Filled 2019-08-16: qty 0.6

## 2019-08-16 MED ORDER — SODIUM CHLORIDE 0.9 % IV SOLN
10.0000 mg | Freq: Once | INTRAVENOUS | Status: AC
  Administered 2019-08-16: 10 mg via INTRAVENOUS
  Filled 2019-08-16: qty 10

## 2019-08-16 MED ORDER — DIPHENHYDRAMINE HCL 50 MG/ML IJ SOLN
25.0000 mg | Freq: Once | INTRAMUSCULAR | Status: AC
  Administered 2019-08-16: 25 mg via INTRAVENOUS
  Filled 2019-08-16: qty 1

## 2019-08-16 MED ORDER — FAMOTIDINE IN NACL 20-0.9 MG/50ML-% IV SOLN
20.0000 mg | Freq: Once | INTRAVENOUS | Status: AC
  Administered 2019-08-16: 20 mg via INTRAVENOUS
  Filled 2019-08-16: qty 50

## 2019-08-16 MED ORDER — CYANOCOBALAMIN 1000 MCG/ML IJ SOLN
INTRAMUSCULAR | Status: AC
  Filled 2019-08-16: qty 1

## 2019-08-16 NOTE — Progress Notes (Signed)
Labs reviewed with MD today . Proceed as planned. Will hold on xgeva for now per MD.   .Jeff [name] arrived today for [facility] neulasta on body injector. See MAR for administration details. Injector in place and engaged with green light indicator on flashing. Tolerated application with out problems.  Treatment given per orders. Patient tolerated it well without problems. Vitals stable and discharged home from clinic ambulatory. Follow up as scheduled.

## 2019-08-16 NOTE — Patient Instructions (Signed)
[facility] Discharge Instructions for Patients Receiving Chemotherapy  Today you received the following chemotherapy agents   To help prevent nausea and vomiting after your treatment, we encourage you to take your nausea medication   If you develop nausea and vomiting that is not controlled by your nausea medication, call the clinic.   BELOW ARE SYMPTOMS THAT SHOULD BE REPORTED IMMEDIATELY:  *FEVER GREATER THAN 100.5 F  *CHILLS WITH OR WITHOUT FEVER  NAUSEA AND VOMITING THAT IS NOT CONTROLLED WITH YOUR NAUSEA MEDICATION  *UNUSUAL SHORTNESS OF BREATH  *UNUSUAL BRUISING OR BLEEDING  TENDERNESS IN MOUTH AND THROAT WITH OR WITHOUT PRESENCE OF ULCERS  *URINARY PROBLEMS  *BOWEL PROBLEMS  UNUSUAL RASH Items with * indicate a potential emergency and should be followed up as soon as possible.  Feel free to call the clinic should you have any questions or concerns. The clinic phone number is [phone].  Please show the CHEMO ALERT CARD at check-in to the Emergency Department and triage nurse.   

## 2019-08-16 NOTE — Progress Notes (Signed)
[place], Jeff [name]   CLINIC:  Medical Oncology/Hematology  PCP:  Holley Bouche, NP (Inactive) [place] [address] [phone]   REASON FOR VISIT: Follow-up for prostate cancer  CURRENT THERAPY:  Cabazitaxel every 3 weeks   BRIEF ONCOLOGIC HISTORY:  Oncology History  Malignant neoplasm of prostate ([place])  01/26/2014 Tumor Marker   PSA > 5000   01/28/2014 Initial Biopsy   Metastatic adenocarcinoma of prostate   02/05/2014 Surgery   Bilateral orchiectomy by Dr. Junious Silk   02/26/2014 Tumor Marker   PSA = 399.5   03/14/2014 Tumor Marker   PSA= 89.51   03/14/2014 - 06/26/2014 Chemotherapy   Docetaxel 75 mg/kg every 21 days x 6 cycles   04/24/2014 Tumor Marker   PSA- 19.89   07/23/2014 Procedure   Nephrostomy tube removed by IR, Dr. Geroge Baseman   07/24/2014 Tumor Marker   PSA= 6.15   10/16/2014 Tumor Marker   PSA: 3.54    01/08/2015 Tumor Marker   PSA: 2.13    01/17/2015 Imaging   CT CAP-  Massive pelvic and retroperitoneal lymphadenopathy noted on the prior study has nearly completely resolved, now with only a small amount of residual amorphous soft tissue predominantly around the the infrarenal abdominal aorta.   01/17/2015 Imaging   Bone scan- Widespread osseous metastatic disease with multiple foci of increased activity throughout the skeleton, corresponding with the findings on the prior CT from October, 2015.   04/11/2015 Tumor Marker   PSA: 1.87    07/21/2015 Imaging   Bone scan- Bony metastatic disease again noted at multiple sites, stable from prior study. No progression of bony metastatic disease is demonstrable on this study.   07/25/2015 Imaging   CT CAP- Stable matted soft tissue density in the retroperitoneum and extraperitoneal pelvis consistent with treated disease. No recurrent lymphadenopathy.  No pulmonary metastatic disease. Diffuse stable sclerotic metastatic bone disease.     01/14/2016 Imaging   Bone scan-  Multiple sites of abnormal increased tracer localization involving BILATERAL ribs, T11, T12, questionably RIGHT scapula and LEFT iliac bone suspicious for osseous metastases.   01/23/2016 Imaging   CT CAP- 1. Similar widespread osseous metastasis. 2. Similar soft tissue thickening within the retroperitoneum of the abdomen. Improved left pelvic side wall soft tissue thickening. These are consistent with sites of treated disease. No well-defined adenopathy. 3. No new sites of disease.   05/03/2016 -  Chemotherapy   Jevtana every 21 days    05/03/2016 -  Chemotherapy   The patient had pegfilgrastim (NEULASTA) injection 6 mg, 6 mg, Subcutaneous,  Once, 1 of 1 cycle Administration: 6 mg (05/05/2016) pegfilgrastim (NEULASTA ONPRO KIT) injection 6 mg, 6 mg, Subcutaneous, Once, 50 of 51 cycles Administration: 6 mg (05/24/2016), 6 mg (06/14/2016), 6 mg (07/07/2016), 6 mg (07/28/2016), 6 mg (08/18/2016), 6 mg (09/08/2016), 6 mg (09/29/2016), 6 mg (10/20/2016), 6 mg (11/10/2016), 6 mg (02/14/2017), 6 mg (03/07/2017), 6 mg (03/28/2017), 6 mg (04/20/2017), 6 mg (05/11/2017), 6 mg (06/27/2017), 6 mg (08/30/2017), 6 mg (09/20/2017), 6 mg (10/11/2017), 6 mg (11/02/2017), 6 mg (08/09/2017), 6 mg (11/23/2017), 6 mg (12/14/2017), 6 mg (01/09/2018), 6 mg (01/31/2018), 6 mg (04/04/2018), 6 mg (04/27/2018), 6 mg (05/18/2018), 6 mg (06/08/2018), 6 mg (06/29/2018), 6 mg (07/20/2018), 6 mg (08/10/2018), 6 mg (09/01/2018), 6 mg (09/22/2018), 6 mg (10/13/2018), 6 mg (11/03/2018), 6 mg (11/24/2018), 6 mg (12/15/2018), 6 mg (01/05/2019), 6 mg (01/26/2019), 6 mg (02/16/2019), 6 mg (03/12/2019), 6 mg (  04/02/2019), 6 mg (04/23/2019), 6 mg (05/14/2019), 6 mg (06/04/2019), 6 mg (06/25/2019), 6 mg (07/16/2019), 6 mg (08/16/2019) cabazitaxel (JEVTANA) 41 mg in dextrose 5 % 250 mL chemo infusion, 20 mg/m2 = 41 mg (100 % of original dose 20 mg/m2), Intravenous,  Once, 51 of 52 cycles Dose modification: 20 mg/m2 (original dose 20 mg/m2, Cycle 1, Reason:  Dose not tolerated, Comment: recommended by Data to use 66m/m), 20 mg/m2 (original dose 20 mg/m2, Cycle 37, Reason: Other (see comments), Comment: Changed to new orderable), 21 mg/m2 (original dose 20 mg/m2, Cycle 40, Reason: Other (see comments)), 20 mg/m2 (original dose 20 mg/m2, Cycle 40, Reason: Other (see comments)) Administration: 41 mg (05/03/2016), 41 mg (05/24/2016), 41 mg (06/14/2016), 41 mg (07/07/2016), 41 mg (07/28/2016), 41 mg (08/18/2016), 41 mg (09/08/2016), 41 mg (09/29/2016), 41 mg (10/20/2016), 41 mg (11/10/2016), 41 mg (02/14/2017), 41 mg (03/07/2017), 41 mg (03/28/2017), 41 mg (04/20/2017), 41 mg (05/11/2017), 41 mg (06/27/2017), 41 mg (08/30/2017), 41 mg (09/20/2017), 41 mg (10/11/2017), 41 mg (11/02/2017), 41 mg (08/09/2017), 41 mg (11/23/2017), 41 mg (12/14/2017), 41 mg (01/09/2018), 41 mg (01/31/2018), 41 mg (02/21/2018), 41 mg (04/04/2018), 41 mg (04/27/2018), 41 mg (05/18/2018), 41 mg (06/08/2018), 41 mg (06/29/2018), 41 mg (07/20/2018), 41 mg (08/10/2018), 41 mg (09/01/2018), 41 mg (09/22/2018), 41 mg (10/13/2018), 41 mg (11/03/2018), 41 mg (11/24/2018), 41 mg (12/15/2018), 41 mg (01/05/2019), 41 mg (01/26/2019), 41 mg (02/16/2019), 41 mg (03/12/2019), 41 mg (04/02/2019), 41 mg (04/23/2019), 41 mg (05/14/2019), 41 mg (06/04/2019), 41 mg (06/25/2019), 41 mg (07/16/2019), 41 mg (08/16/2019)  for chemotherapy treatment.    09/06/2016 Imaging   Restaging CT C/A/P: IMPRESSION: 1. Similar appearance of widespread sclerotic bone metastases. 2. No change in soft tissue thickening within the retroperitoneum and left pelvic sidewall. No well defined adenopathy identified. 3. No new sites of disease 4. Aortic Atherosclerosis (ICD10-I70.0). Coronary artery calcifications noted. 5. 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 6. Stable 9 mm pancreatic cystic lesion. Favor pseudocyst. Indolent neoplasm may look similar. Attention on follow-up imaging.   09/06/2016 Imaging   Bone  Scan: IMPRESSION: In this patient with known diffuse sclerotic metastatic disease, bone scan does not reveal progression of radiotracer uptake. Several of the areas of previously demonstrated radiotracer uptake appear less prominent.  Change in appearance of radiotracer uptake involving the mandible now more notable on the right and previously more notable on the left may reflect result of dental disease rather than metastatic disease.    04/08/2017 Tumor Marker   PSA 0.89     INTERVAL HISTORY:  [name]75y.o. male seen for follow-up and toxicity assessment prior to next cycle of cabazitaxel.  He had CT scans done on 08/03/2019.  Denies any new onset pains.  Appetite is 100%.  Energy levels are 75%.  No pains reported.  Denies any GI side effects.  REVIEW OF SYSTEMS:  Review of Systems  All other systems reviewed and are negative.    PAST MEDICAL/SURGICAL HISTORY:  Past Medical History:  Diagnosis Date  . [place] ([place] 01/26/2014  . Alcohol abuse   . Anemia of chronic disease 01/26/2014  . Bilateral hydronephrosis 01/26/2014  . History of cocaine abuse ([facility] 01/26/2014  . Homelessness 01/31/2014  . Prostate cancer metastatic to multiple sites ([facility] 01/30/2014  . Sepsis ([facility]   . UTI (lower urinary tract infection)    Past Surgical History:  Procedure Laterality Date  . ORCHIECTOMY Bilateral 02/05/2014   Procedure: BILATERAL ORCHIECTOMY;  Surgeon: MRodman Key  Junious Silk, MD;  Location: [facility];  Service: Urology;  Laterality: Bilateral;  . PERCUTANEOUS NEPHROSTOMY Bilateral    IR Dr. Junious Silk changed on 04/22/2014  . PORT-A-CATH REMOVAL Right 08/08/2017   Procedure: REMOVAL PORT-A-CATH RIGHT CHEST (PROCEDURE #2);  Surgeon: Aviva Signs, MD;  Location: [facility];  Service: General;  Laterality: Right;  . PORTACATH PLACEMENT Right 03/12/14  . PORTACATH PLACEMENT Left 08/08/2017   Procedure: INSERTION POWER PORT WITH ATTACHED CATHETER LEFT SUBCLAVIAN (PROCEDURE #1);   Surgeon: Aviva Signs, MD;  Location: [facility];  Service: General;  Laterality: Left;     SOCIAL HISTORY:  Social History   Socioeconomic History  . Marital status: Single    Spouse name: Not on file  . Number of children: 1  . Years of education: 9th  . Highest education level: Not on file  Occupational History  . Occupation: disabilty    Employer: [employer]  Tobacco Use  . Smoking status: Current Every Day Smoker    Packs/day: 0.25    Years: 55.00    Pack years: 13.75    Types: Cigarettes  . Smokeless tobacco: Never Used  Substance and Sexual Activity  . Alcohol use: Yes    Alcohol/week: 1.0 standard drinks    Types: 1 Cans of beer per week  . Drug use: Yes    Types: Cocaine    Comment: last used Cocaine 01/25/14  . Sexual activity: Yes  Other Topics Concern  . Not on file  Social History Narrative   Lives at [employer] 9th grade   1 son, lives in [place], [place]   Can read and write in native language            Social Determinants of Health   Financial Resource Strain:   . Difficulty of Paying Living Expenses:   Food Insecurity:   . Worried About Charity fundraiser in the Last Year:   . Arboriculturist in the Last Year:   Transportation Needs:   . Film/video editor (Medical):   [name] Kitchen Lack of Transportation (Non-Medical):   Physical Activity:   . Days of Exercise per Week:   . Minutes of Exercise per Session:   Stress:   . Feeling of Stress :   Social Connections:   . Frequency of Communication with Friends and Family:   . Frequency of Social Gatherings with Friends and Family:   . Attends Religious Services:   . Active Member of Clubs or Organizations:   . Attends Archivist Meetings:   [name] Kitchen Marital Status:   Intimate Partner Violence:   . Fear of Current or Ex-Partner:   . Emotionally Abused:   [name] Kitchen Physically Abused:   . Sexually Abused:     FAMILY HISTORY:  Family History  Problem Relation Age of Onset  .  Cancer Brother 57    CURRENT MEDICATIONS:  Outpatient Encounter Medications as of 08/16/2019  Medication Sig Note  . calcium gluconate 500 MG tablet Take 1 tablet by mouth 2 (two) times daily.   . prochlorperazine (COMPAZINE) 10 MG tablet The day after chemo take 1 tab four times a day x 2 days. Then may take 1 tab four times a day if needed for nausea/vomiting. (Patient not taking: Reported on 03/12/2019) 07/24/2014: Just takes as needed   Facility-Administered Encounter Medications as of 08/16/2019  Medication  . cyanocobalamin ((VITAMIN B-12)) injection 1,000 mcg  . denosumab (XGEVA) injection 120 mg    ALLERGIES:  No Known Allergies   PHYSICAL EXAM:  ECOG Performance status: 1  Vitals:   08/16/19 0907  BP: (!) 97/57  Pulse: 73  Resp: 19  Temp: (!) 96.9 F (36.1 C)  SpO2: 100%   Filed Weights   08/16/19 0907  Weight: 163 lb 4.8 oz (74.1 kg)    Physical Exam Constitutional:      Appearance: Normal appearance. He is normal weight.  Cardiovascular:     Rate and Rhythm: Normal rate and regular rhythm.     Heart sounds: Normal heart sounds.  Pulmonary:     Effort: Pulmonary effort is normal.     Breath sounds: Normal breath sounds.  Abdominal:     General: Bowel sounds are normal.     Palpations: Abdomen is soft.  Musculoskeletal:        General: Normal range of motion.  Skin:    General: Skin is warm and dry.  Neurological:     Mental Status: He is alert and oriented to person, place, and time. Mental status is at baseline.  Psychiatric:        Mood and Affect: Mood normal.        Behavior: Behavior normal.        Thought Content: Thought content normal.        Judgment: Judgment normal.      LABORATORY DATA:  I have reviewed the labs as listed.  CBC    Component Value Date/Time   WBC 5.3 08/16/2019 0824   RBC 3.41 (L) 08/16/2019 0824   HGB 10.5 (L) 08/16/2019 0824   HCT 33.7 (L) 08/16/2019 0824   PLT 309 08/16/2019 0824   MCV 98.8 08/16/2019 0824     MCH 30.8 08/16/2019 0824   MCHC 31.2 08/16/2019 0824   RDW 17.4 (H) 08/16/2019 0824   LYMPHSABS 1.2 08/16/2019 0824   MONOABS 0.6 08/16/2019 0824   EOSABS 0.1 08/16/2019 0824   BASOSABS 0.0 08/16/2019 0824   CMP Latest Ref Rng & Units 08/16/2019 07/16/2019 06/25/2019  Glucose 70 - 99 mg/dL 87 101(H) 95  BUN 8 - 23 mg/dL 17 25(H) 24(H)  Creatinine 0.61 - 1.24 mg/dL 1.77(H) 2.17(H) 2.32(H)  Sodium 135 - 145 mmol/L 136 134(L) 134(L)  Potassium 3.5 - 5.1 mmol/L 4.5 5.4(H) 5.1  Chloride 98 - 111 mmol/L 103 101 100  CO2 22 - 32 mmol/L '24 25 24  ' Calcium 8.9 - 10.3 mg/dL 8.4(L) 8.8(L) 9.1  Total Protein 6.5 - 8.1 g/dL 7.6 8.3(H) 8.3(H)  Total Bilirubin 0.3 - 1.2 mg/dL 0.4 0.4 0.3  Alkaline Phos 38 - 126 U/L 50 56 53  AST 15 - 41 U/L '27 30 24  ' ALT 0 - 44 U/L '18 20 19   ' I have reviewed the scans and discussed with him.   ASSESSMENT & PLAN:   Malignant neoplasm of prostate ([place]) 1.  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 -Baseline PSA is between 0.7 and 1. -Last PSA improved to 1.06 on 07/16/2019 from 1.27 on 06/25/2019. -I reviewed bone scan from 08/03/2019.  Uptake in the right mandible is slightly more progressive than prior study in April 2019.  Multiple additional sites in the ribs and pelvis are no longer identified.  Uptake in the left maxilla, question dental disease. -CT CAP from 08/03/2019 did not show any significant change from August 2018 CT study.  No new or progressive disease.  Stable extensive sclerotic bone lesions throughout the axial  and proximal appendicular skeleton.  Chronic pathological mid T3 vertebral fracture.  No pathologically enlarged abdominal lymph nodes. -As his PSA has improved to his baseline, we will renew cabazitaxel without any change in treatments.  I will see him back in 3 weeks for follow-up.  2.  Bone metastasis: -He will is receiving denosumab monthly and is tolerating well.   He will continue calcium supplements. -We will hold his denosumab today.  3.  Renal insufficiency: -Baseline creatinine between 1.8-2.0.  4.  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  Hemoglobin is between 10 and 11.7. -We will plan to check ferritin and iron panel next time.  5.  Vitamin D deficiency: -We will start him on vitamin D 50,000 units weekly.       Orders placed this encounter:  No orders of the defined types were placed in this encounter.     Derek Jack, MD [place] [phone]

## 2019-08-16 NOTE — Patient Instructions (Signed)
[facility] at [facility] Discharge Instructions  You were seen today by Dr. Katragadda. He went over your recent lab results. He will see you back in 3 weeks for labs, treatment and follow up.   Thank you for choosing [facility] at [facility] to provide your oncology and hematology care.  To afford each patient quality time with our provider, please arrive at least 15 minutes before your scheduled appointment time.   If you have a lab appointment with the Cancer Center please come in thru the  Main Entrance and check in at the main information desk  You need to re-schedule your appointment should you arrive 10 or more minutes late.  We strive to give you quality time with our providers, and arriving late affects you and other patients whose appointments are after yours.  Also, if you no show three or more times for appointments you may be dismissed from the clinic at the providers discretion.     Again, thank you for choosing [facility].  Our hope is that these requests will decrease the amount of time that you wait before being seen by our physicians.       _____________________________________________________________  Should you have questions after your visit to [facility], please contact our office at [phone] between the hours of 8:00 a.m. and 4:30 p.m.  Voicemails left after 4:00 p.m. will not be returned until the following business day.  For prescription refill requests, have your pharmacy contact our office and allow 72 hours.    Cancer Center Support Programs:   > Cancer Support Group  2nd Tuesday of the month 1pm-2pm, Journey Room    

## 2019-08-16 NOTE — Assessment & Plan Note (Addendum)
1.  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 -Baseline PSA is between 0.7 and 1. -Last PSA improved to 1.06 on 07/16/2019 from 1.27 on 06/25/2019. -I reviewed bone scan from 08/03/2019.  Uptake in the right mandible is slightly more progressive than prior study in April 2019.  Multiple additional sites in the ribs and pelvis are no longer identified.  Uptake in the left maxilla, question dental disease. -CT CAP from 08/03/2019 did not show any significant change from August 2018 CT study.  No new or progressive disease.  Stable extensive sclerotic bone lesions throughout the axial and proximal appendicular skeleton.  Chronic pathological mid T3 vertebral fracture.  No pathologically enlarged abdominal lymph nodes. -As his PSA has improved to his baseline, we will renew cabazitaxel without any change in treatments.  I will see him back in 3 weeks for follow-up.  2.  Bone metastasis: -He will is receiving denosumab monthly and is tolerating well.  He will continue calcium supplements. -We will hold his denosumab today.  3.  Renal insufficiency: -Baseline creatinine between 1.8-2.0.  4.  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  Hemoglobin is between 10 and 11.7. -We will plan to check ferritin and iron panel next time.  5.  Vitamin D deficiency: -We will start him on vitamin D 50,000 units weekly.

## 2019-08-16 NOTE — Progress Notes (Signed)
Patient has been assessed, vital signs and labs have been reviewed by Dr. Delton Coombes. ANC, Creatinine, LFTs, and Platelets are within treatment parameters per Dr. Delton Coombes. The patient is good to proceed with treatment at this time. HOLD the Xgeva today due to findings on the scan per Dr. Delton Coombes.

## 2019-08-17 MED ORDER — PEGFILGRASTIM-JMDB 6 MG/0.6ML ~~LOC~~ SOSY
PREFILLED_SYRINGE | SUBCUTANEOUS | Status: AC
  Filled 2019-08-17: qty 0.6

## 2019-09-06 ENCOUNTER — Other Ambulatory Visit: Payer: Self-pay

## 2019-09-06 ENCOUNTER — Inpatient Hospital Stay (HOSPITAL_BASED_OUTPATIENT_CLINIC_OR_DEPARTMENT_OTHER): Payer: Medicare Other | Admitting: Hematology

## 2019-09-06 ENCOUNTER — Inpatient Hospital Stay (HOSPITAL_COMMUNITY): Payer: Medicare Other

## 2019-09-06 ENCOUNTER — Inpatient Hospital Stay (HOSPITAL_COMMUNITY): Payer: Medicare Other | Attending: Hematology

## 2019-09-06 VITALS — BP 108/69 | HR 66 | Temp 97.9°F | Resp 18

## 2019-09-06 DIAGNOSIS — M899 Disorder of bone, unspecified: Secondary | ICD-10-CM

## 2019-09-06 DIAGNOSIS — N189 Chronic kidney disease, unspecified: Secondary | ICD-10-CM | POA: Insufficient documentation

## 2019-09-06 DIAGNOSIS — C61 Malignant neoplasm of prostate: Secondary | ICD-10-CM

## 2019-09-06 DIAGNOSIS — D631 Anemia in chronic kidney disease: Secondary | ICD-10-CM | POA: Diagnosis not present

## 2019-09-06 DIAGNOSIS — E538 Deficiency of other specified B group vitamins: Secondary | ICD-10-CM

## 2019-09-06 DIAGNOSIS — C7951 Secondary malignant neoplasm of bone: Secondary | ICD-10-CM | POA: Diagnosis not present

## 2019-09-06 DIAGNOSIS — Z5189 Encounter for other specified aftercare: Secondary | ICD-10-CM | POA: Insufficient documentation

## 2019-09-06 DIAGNOSIS — Z5111 Encounter for antineoplastic chemotherapy: Secondary | ICD-10-CM | POA: Diagnosis not present

## 2019-09-06 DIAGNOSIS — E559 Vitamin D deficiency, unspecified: Secondary | ICD-10-CM | POA: Insufficient documentation

## 2019-09-06 LAB — COMPREHENSIVE METABOLIC PANEL
ALT: 17 U/L (ref 0–44)
AST: 21 U/L (ref 15–41)
Albumin: 3.9 g/dL (ref 3.5–5.0)
Alkaline Phosphatase: 54 U/L (ref 38–126)
Anion gap: 7 (ref 5–15)
BUN: 19 mg/dL (ref 8–23)
CO2: 24 mmol/L (ref 22–32)
Calcium: 8.7 mg/dL — ABNORMAL LOW (ref 8.9–10.3)
Chloride: 105 mmol/L (ref 98–111)
Creatinine, Ser: 1.81 mg/dL — ABNORMAL HIGH (ref 0.61–1.24)
GFR calc Af Amer: 42 mL/min — ABNORMAL LOW (ref >60)
GFR calc non Af Amer: 37 mL/min — ABNORMAL LOW (ref >60)
Glucose, Bld: 100 mg/dL — ABNORMAL HIGH (ref 70–99)
Potassium: 5.1 mmol/L (ref 3.5–5.1)
Sodium: 136 mmol/L (ref 135–145)
Total Bilirubin: 0.4 mg/dL (ref 0.3–1.2)
Total Protein: 7.6 g/dL (ref 6.5–8.1)

## 2019-09-06 LAB — CBC WITH DIFFERENTIAL/PLATELET
Abs Immature Granulocytes: 0.02 10*3/uL (ref 0.00–0.07)
Basophils Absolute: 0 10*3/uL (ref 0.0–0.1)
Basophils Relative: 0 %
Eosinophils Absolute: 0 10*3/uL (ref 0.0–0.5)
Eosinophils Relative: 1 %
HCT: 36 % — ABNORMAL LOW (ref 39.0–52.0)
Hemoglobin: 11.1 g/dL — ABNORMAL LOW (ref 13.0–17.0)
Immature Granulocytes: 0 %
Lymphocytes Relative: 22 %
Lymphs Abs: 1.4 10*3/uL (ref 0.7–4.0)
MCH: 29.9 pg (ref 26.0–34.0)
MCHC: 30.8 g/dL (ref 30.0–36.0)
MCV: 97 fL (ref 80.0–100.0)
Monocytes Absolute: 0.7 10*3/uL (ref 0.1–1.0)
Monocytes Relative: 11 %
Neutro Abs: 4.2 10*3/uL (ref 1.7–7.7)
Neutrophils Relative %: 66 %
Platelets: 295 10*3/uL (ref 150–400)
RBC: 3.71 MIL/uL — ABNORMAL LOW (ref 4.22–5.81)
RDW: 17.2 % — ABNORMAL HIGH (ref 11.5–15.5)
WBC: 6.4 10*3/uL (ref 4.0–10.5)
nRBC: 0 % (ref 0.0–0.2)

## 2019-09-06 LAB — PSA: Prostatic Specific Antigen: 1.29 ng/mL (ref 0.00–4.00)

## 2019-09-06 MED ORDER — DENOSUMAB 120 MG/1.7ML ~~LOC~~ SOLN
120.0000 mg | Freq: Once | SUBCUTANEOUS | Status: DC
  Filled 2019-09-06: qty 1.7

## 2019-09-06 MED ORDER — SODIUM CHLORIDE 0.9% FLUSH
10.0000 mL | INTRAVENOUS | Status: DC | PRN
  Administered 2019-09-06: 10 mL

## 2019-09-06 MED ORDER — FAMOTIDINE IN NACL 20-0.9 MG/50ML-% IV SOLN
20.0000 mg | Freq: Once | INTRAVENOUS | Status: AC
  Administered 2019-09-06: 20 mg via INTRAVENOUS
  Filled 2019-09-06: qty 50

## 2019-09-06 MED ORDER — SODIUM CHLORIDE 0.9 % IV SOLN: Freq: Once | INTRAVENOUS | Status: AC

## 2019-09-06 MED ORDER — SODIUM CHLORIDE 0.9 % IV SOLN
20.0000 mg/m2 | Freq: Once | INTRAVENOUS | Status: AC
  Administered 2019-09-06: 41 mg via INTRAVENOUS
  Filled 2019-09-06: qty 4.1

## 2019-09-06 MED ORDER — HEPARIN SOD (PORK) LOCK FLUSH 100 UNIT/ML IV SOLN
500.0000 [IU] | Freq: Once | INTRAVENOUS | Status: AC | PRN
  Administered 2019-09-06: 500 [IU]

## 2019-09-06 MED ORDER — DIPHENHYDRAMINE HCL 50 MG/ML IJ SOLN
25.0000 mg | Freq: Once | INTRAMUSCULAR | Status: AC
  Administered 2019-09-06: 25 mg via INTRAVENOUS
  Filled 2019-09-06: qty 1

## 2019-09-06 MED ORDER — PEGFILGRASTIM 6 MG/0.6ML ~~LOC~~ PSKT
6.0000 mg | PREFILLED_SYRINGE | Freq: Once | SUBCUTANEOUS | Status: AC
  Administered 2019-09-06: 6 mg via SUBCUTANEOUS
  Filled 2019-09-06: qty 0.6

## 2019-09-06 MED ORDER — CYANOCOBALAMIN 1000 MCG/ML IJ SOLN
1000.0000 ug | Freq: Once | INTRAMUSCULAR | Status: AC
  Administered 2019-09-06: 1000 ug via INTRAMUSCULAR
  Filled 2019-09-06: qty 1

## 2019-09-06 MED ORDER — SODIUM CHLORIDE 0.9 % IV SOLN
10.0000 mg | Freq: Once | INTRAVENOUS | Status: AC
  Administered 2019-09-06: 10 mg via INTRAVENOUS
  Filled 2019-09-06: qty 10

## 2019-09-06 NOTE — Assessment & Plan Note (Addendum)
1.  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 -Baseline PSA is between 0.7 and 1. -PSA 1.09 on 08/16/2019, 1.06 on 07/16/2019, 1.27 on 06/25/2019. -Bone scan on 08/03/2019 showed uptake in the right mandible slightly more progressive than prior study in April 2019.  Multiple additional sites in the ribs and pelvis are no longer identified.  Uptake in left maxilla, question dental disease. -CT CAP from 08/03/2019 did not show any significant change from August 2018 CT study.  No new or progressive disease.  Stable extensive sclerotic bone lesions throughout the axial and proximal appendicular skeleton.  Chronic pathological mid T3 vertebral fracture: No pathologically enlarged abdominal lymph nodes. -As the PSA is remaining around 1, will continue cabazitaxel.  I have reviewed his labs including LFTs today which are adequate.  We will reevaluate him in 3 weeks.  2.  Bone metastasis: -No pain or tenderness in the right mandible.  There is sensitivity in the left maxillary region.  Continue to hold denosumab.  3.  Renal insufficiency: -Baseline creatinine between 1.8-2.0.  This is stable.  4.  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  Hemoglobin today 11.1.  We will plan to check iron panel and ferritin next time.  5.  Vitamin D deficiency: -We will repeat vitamin D levels.  If they are low we will start him on prescription vitamin D.

## 2019-09-06 NOTE — Progress Notes (Signed)
Patient has been assessed, vital signs and labs have been reviewed by Dr. Delton Coombes. ANC, Creatinine, LFTs, and Platelets are within treatment parameters per Dr. Delton Coombes. The patient is good to proceed with treatment at this time. Continue to hold Xgeva due to right jaw uptake on recent scan.

## 2019-09-06 NOTE — Patient Instructions (Signed)
[facility] at [facility] Discharge Instructions  You were seen today by Dr. Katragadda. He went over your recent lab results. He will see you back in 3 weeks for labs, treatment and follow up.   Thank you for choosing  [facility] Center at [facility] to provide your oncology and hematology care.  To afford each patient quality time with our provider, please arrive at least 15 minutes before your scheduled appointment time.   If you have a lab appointment with the Cancer Center please come in thru the  Main Entrance and check in at the main information desk  You need to re-schedule your appointment should you arrive 10 or more minutes late.  We strive to give you quality time with our providers, and arriving late affects you and other patients whose appointments are after yours.  Also, if you no show three or more times for appointments you may be dismissed from the clinic at the providers discretion.     Again, thank you for choosing [facility].  Our hope is that these requests will decrease the amount of time that you wait before being seen by our physicians.       _____________________________________________________________  Should you have questions after your visit to [facility], please contact our office at [phone] between the hours of 8:00 a.m. and 4:30 p.m.  Voicemails left after 4:00 p.m. will not be returned until the following business day.  For prescription refill requests, have your pharmacy contact our office and allow 72 hours.    Cancer Center Support Programs:   > Cancer Support Group  2nd Tuesday of the month 1pm-2pm, Journey Room    

## 2019-09-06 NOTE — Patient Instructions (Signed)
[facility] Discharge Instructions for Patients Receiving Chemotherapy  Today you received the following chemotherapy agents   To help prevent nausea and vomiting after your treatment, we encourage you to take your nausea medication   If you develop nausea and vomiting that is not controlled by your nausea medication, call the clinic.   BELOW ARE SYMPTOMS THAT SHOULD BE REPORTED IMMEDIATELY:  *FEVER GREATER THAN 100.5 F  *CHILLS WITH OR WITHOUT FEVER  NAUSEA AND VOMITING THAT IS NOT CONTROLLED WITH YOUR NAUSEA MEDICATION  *UNUSUAL SHORTNESS OF BREATH  *UNUSUAL BRUISING OR BLEEDING  TENDERNESS IN MOUTH AND THROAT WITH OR WITHOUT PRESENCE OF ULCERS  *URINARY PROBLEMS  *BOWEL PROBLEMS  UNUSUAL RASH Items with * indicate a potential emergency and should be followed up as soon as possible.  Feel free to call the clinic should you have any questions or concerns. The clinic phone number is [phone].  Please show the CHEMO ALERT CARD at check-in to the Emergency Department and triage nurse.   

## 2019-09-06 NOTE — Progress Notes (Signed)
[name] [place], [place] [address]   CLINIC:  Medical Oncology/Hematology  PCP:  Holley Bouche, NP (Inactive) [place] [id number] [phone]   REASON FOR VISIT: Follow-up for prostate cancer  CURRENT THERAPY:  Cabazitaxel every 3 weeks   BRIEF ONCOLOGIC HISTORY:  Oncology History  Malignant neoplasm of prostate ([place])  01/26/2014 Tumor Marker   PSA > 5000   01/28/2014 Initial Biopsy   Metastatic adenocarcinoma of prostate   02/05/2014 Surgery   Bilateral orchiectomy by Dr. Junious Silk   02/26/2014 Tumor Marker   PSA = 399.5   03/14/2014 Tumor Marker   PSA= 89.51   03/14/2014 - 06/26/2014 Chemotherapy   Docetaxel 75 mg/kg every 21 days x 6 cycles   04/24/2014 Tumor Marker   PSA- 19.89   07/23/2014 Procedure   Nephrostomy tube removed by IR, Dr. Geroge Baseman   07/24/2014 Tumor Marker   PSA= 6.15   10/16/2014 Tumor Marker   PSA: 3.54    01/08/2015 Tumor Marker   PSA: 2.13    01/17/2015 Imaging   CT CAP-  Massive pelvic and retroperitoneal lymphadenopathy noted on the prior study has nearly completely resolved, now with only a small amount of residual amorphous soft tissue predominantly around the the infrarenal abdominal aorta.   01/17/2015 Imaging   Bone scan- Widespread osseous metastatic disease with multiple foci of increased activity throughout the skeleton, corresponding with the findings on the prior CT from October, 2015.   04/11/2015 Tumor Marker   PSA: 1.87    07/21/2015 Imaging   Bone scan- Bony metastatic disease again noted at multiple sites, stable from prior study. No progression of bony metastatic disease is demonstrable on this study.   07/25/2015 Imaging   CT CAP- Stable matted soft tissue density in the retroperitoneum and extraperitoneal pelvis consistent with treated disease. No recurrent lymphadenopathy.  No pulmonary metastatic disease. Diffuse stable sclerotic metastatic bone disease.     01/14/2016 Imaging   Bone scan-  Multiple sites of abnormal increased tracer localization involving BILATERAL ribs, T11, T12, questionably RIGHT scapula and LEFT iliac bone suspicious for osseous metastases.   01/23/2016 Imaging   CT CAP- 1. Similar widespread osseous metastasis. 2. Similar soft tissue thickening within the retroperitoneum of the abdomen. Improved left pelvic side wall soft tissue thickening. These are consistent with sites of treated disease. No well-defined adenopathy. 3. No new sites of disease.   05/03/2016 -  Chemotherapy   Jevtana every 21 days    05/03/2016 -  Chemotherapy   The patient had pegfilgrastim (NEULASTA) injection 6 mg, 6 mg, Subcutaneous,  Once, 1 of 1 cycle Administration: 6 mg (05/05/2016) pegfilgrastim (NEULASTA ONPRO KIT) injection 6 mg, 6 mg, Subcutaneous, Once, 51 of 51 cycles Administration: 6 mg (05/24/2016), 6 mg (06/14/2016), 6 mg (07/07/2016), 6 mg (07/28/2016), 6 mg (08/18/2016), 6 mg (09/08/2016), 6 mg (09/29/2016), 6 mg (10/20/2016), 6 mg (11/10/2016), 6 mg (02/14/2017), 6 mg (03/07/2017), 6 mg (03/28/2017), 6 mg (04/20/2017), 6 mg (05/11/2017), 6 mg (06/27/2017), 6 mg (08/30/2017), 6 mg (09/20/2017), 6 mg (10/11/2017), 6 mg (11/02/2017), 6 mg (08/09/2017), 6 mg (11/23/2017), 6 mg (12/14/2017), 6 mg (01/09/2018), 6 mg (01/31/2018), 6 mg (04/04/2018), 6 mg (04/27/2018), 6 mg (05/18/2018), 6 mg (06/08/2018), 6 mg (06/29/2018), 6 mg (07/20/2018), 6 mg (08/10/2018), 6 mg (09/01/2018), 6 mg (09/22/2018), 6 mg (10/13/2018), 6 mg (11/03/2018), 6 mg (11/24/2018), 6 mg (12/15/2018), 6 mg (01/05/2019), 6 mg (01/26/2019), 6 mg (02/16/2019), 6 mg (03/12/2019), 6 mg (  04/02/2019), 6 mg (04/23/2019), 6 mg (05/14/2019), 6 mg (06/04/2019), 6 mg (06/25/2019), 6 mg (07/16/2019), 6 mg (08/16/2019) cabazitaxel (JEVTANA) 41 mg in dextrose 5 % 250 mL chemo infusion, 20 mg/m2 = 41 mg (100 % of original dose 20 mg/m2), Intravenous,  Once, 52 of 52 cycles Dose modification: 20 mg/m2 (original dose 20 mg/m2, Cycle 1, Reason:  Dose not tolerated, Comment: recommended by Data to use 75m/m), 20 mg/m2 (original dose 20 mg/m2, Cycle 37, Reason: Other (see comments), Comment: Changed to new orderable), 21 mg/m2 (original dose 20 mg/m2, Cycle 40, Reason: Other (see comments)), 20 mg/m2 (original dose 20 mg/m2, Cycle 40, Reason: Other (see comments)) Administration: 41 mg (05/03/2016), 41 mg (05/24/2016), 41 mg (06/14/2016), 41 mg (07/07/2016), 41 mg (07/28/2016), 41 mg (08/18/2016), 41 mg (09/08/2016), 41 mg (09/29/2016), 41 mg (10/20/2016), 41 mg (11/10/2016), 41 mg (02/14/2017), 41 mg (03/07/2017), 41 mg (03/28/2017), 41 mg (04/20/2017), 41 mg (05/11/2017), 41 mg (06/27/2017), 41 mg (08/30/2017), 41 mg (09/20/2017), 41 mg (10/11/2017), 41 mg (11/02/2017), 41 mg (08/09/2017), 41 mg (11/23/2017), 41 mg (12/14/2017), 41 mg (01/09/2018), 41 mg (01/31/2018), 41 mg (02/21/2018), 41 mg (04/04/2018), 41 mg (04/27/2018), 41 mg (05/18/2018), 41 mg (06/08/2018), 41 mg (06/29/2018), 41 mg (07/20/2018), 41 mg (08/10/2018), 41 mg (09/01/2018), 41 mg (09/22/2018), 41 mg (10/13/2018), 41 mg (11/03/2018), 41 mg (11/24/2018), 41 mg (12/15/2018), 41 mg (01/05/2019), 41 mg (01/26/2019), 41 mg (02/16/2019), 41 mg (03/12/2019), 41 mg (04/02/2019), 41 mg (04/23/2019), 41 mg (05/14/2019), 41 mg (06/04/2019), 41 mg (06/25/2019), 41 mg (07/16/2019), 41 mg (08/16/2019)  for chemotherapy treatment.    09/06/2016 Imaging   Restaging CT C/A/P: IMPRESSION: 1. Similar appearance of widespread sclerotic bone metastases. 2. No change in soft tissue thickening within the retroperitoneum and left pelvic sidewall. No well defined adenopathy identified. 3. No new sites of disease 4. Aortic Atherosclerosis (ICD10-I70.0). Coronary artery calcifications noted. 5. 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 6. Stable 9 mm pancreatic cystic lesion. Favor pseudocyst. Indolent neoplasm may look similar. Attention on follow-up imaging.   09/06/2016 Imaging   Bone  Scan: IMPRESSION: In this patient with known diffuse sclerotic metastatic disease, bone scan does not reveal progression of radiotracer uptake. Several of the areas of previously demonstrated radiotracer uptake appear less prominent.  Change in appearance of radiotracer uptake involving the mandible now more notable on the right and previously more notable on the left may reflect result of dental disease rather than metastatic disease.    04/08/2017 Tumor Marker   PSA 0.89     INTERVAL HISTORY:  [name]89y.o. 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  Denies any tingling or numbness in extremities.  No new onset pains.  Denies any jaw pains.  Reports some sensitivity in the left upper maxillary area when he eats certain type of foods.  Appetite and energy levels are 75%.  No fevers or chills reported.  REVIEW OF SYSTEMS:  Review of Systems  All other systems reviewed and are negative.    PAST MEDICAL/SURGICAL HISTORY:  Past Medical History:  Diagnosis Date  . [place] renal [place] ([place] 01/26/2014  . Alcohol abuse   . Anemia of chronic disease 01/26/2014  . Bilateral hydronephrosis 01/26/2014  . [place] of [place] abuse ([place] 01/26/2014  . Homelessness 01/31/2014  . [place] 01/30/2014  . [place]epsis ([place]   . UTI (lower urinary tract infection)    Past  Surgical History:  Procedure Laterality Date  . ORCHIECTOMY Bilateral 02/05/2014   Procedure: BILATERAL ORCHIECTOMY;  Surgeon: Festus Aloe, MD;  Location: [facility];  Service: Urology;  Laterality: Bilateral;  . PERCUTANEOUS NEPHROSTOMY Bilateral    IR Dr. Junious Silk changed on 04/22/2014  . PORT-A-CATH REMOVAL Right 08/08/2017   Procedure: REMOVAL PORT-A-CATH RIGHT CHEST (PROCEDURE #2);  Surgeon: Aviva Signs, MD;  Location: [facility];  Service: General;  Laterality: Right;  . PORTACATH PLACEMENT Right  03/12/14  . PORTACATH PLACEMENT Left 08/08/2017   Procedure: INSERTION POWER PORT WITH ATTACHED CATHETER LEFT SUBCLAVIAN (PROCEDURE #1);  Surgeon: Aviva Signs, MD;  Location: [facility];  Service: General;  Laterality: Left;     SOCIAL HISTORY:  Social History   Socioeconomic History  . Marital status: Single    Spouse name: Not on file  . Number of children: 1  . Years of education: 9th  . Highest education level: Not on file  Occupational History  . Occupation: disabilty    Employer: [employer]  Tobacco Use  . Smoking status: Current Every Day Smoker    Packs/day: 0.25    Years: 55.00    Pack years: 13.75    Types: Cigarettes  . Smokeless tobacco: Never Used  Substance and Sexual Activity  . Alcohol use: Yes    Alcohol/week: 1.0 standard drinks    Types: 1 Cans of beer per week  . Drug use: Yes    Types: Cocaine    Comment: last used Cocaine 01/25/14  . Sexual activity: Yes  Other Topics Concern  . Not on file  Social History Narrative   Lives at [employer]   1 son, lives in [place], [place]   Can read and write in native language            Social Determinants of Health   Financial Resource Strain:   . Difficulty of Paying Living Expenses:   Food Insecurity:   . Worried About Charity fundraiser in the Last Year:   . Arboriculturist in the Last Year:   Transportation Needs:   . Film/video editor (Medical):   [name] Kitchen Lack of Transportation (Non-Medical):   Physical Activity:   . Days of Exercise per Week:   . Minutes of Exercise per Session:   Stress:   . Feeling of Stress :   Social Connections:   . Frequency of Communication with Friends and Family:   . Frequency of Social Gatherings with Friends and Family:   . Attends Religious Services:   . Active Member of Clubs or Organizations:   . Attends Archivist Meetings:   [name] Kitchen Marital Status:   Intimate Partner Violence:   . Fear of Current or Ex-Partner:   .  Emotionally Abused:   [name] Kitchen Physically Abused:   . Sexually Abused:     FAMILY HISTORY:  Family History  Problem Relation Age of Onset  . Cancer Brother 31    CURRENT MEDICATIONS:  Outpatient Encounter Medications as of 09/06/2019  Medication Sig Note  . calcium gluconate 500 MG tablet Take 1 tablet by mouth 2 (two) times daily.   . prochlorperazine (COMPAZINE) 10 MG tablet The day after chemo take 1 tab four times a day x 2 days. Then may take 1 tab four times a day if needed for nausea/vomiting. (Patient not taking: Reported on 03/12/2019) 07/24/2014: Just takes as needed   Facility-Administered Encounter Medications as of 09/06/2019  Medication Note  . [  COMPLETED] 0.9 %  sodium chloride infusion   . cabazitaxel (JEVTANA) 41 mg in sodium chloride 0.9 % 250 mL chemo infusion   . cyanocobalamin ((VITAMIN B-12)) injection 1,000 mcg   . cyanocobalamin ((VITAMIN B-12)) injection 1,000 mcg   . dexamethasone (DECADRON) 10 mg in sodium chloride 0.9 % 50 mL IVPB   . [COMPLETED] diphenhydrAMINE (BENADRYL) injection 25 mg   . famotidine (PEPCID) IVPB 20 mg premix   . heparin lock flush 100 unit/mL   . pegfilgrastim (NEULASTA ONPRO KIT) injection 6 mg   . sodium chloride flush (NS) 0.9 % injection 10 mL   . [DISCONTINUED] denosumab (XGEVA) injection 120 mg 09/06/2019: per MD note continue to hold  . [DISCONTINUED] denosumab (XGEVA) injection 120 mg 09/06/2019: per MD note continue to hold    ALLERGIES:  No Known Allergies   PHYSICAL EXAM:  ECOG Performance status: 1  Vitals:   09/06/19 1224  BP: 106/61  Pulse: 68  Resp: 18  Temp: (!) 96.4 F (35.8 C)  SpO2: 100%   Filed Weights   09/06/19 1224  Weight: 161 lb 6.4 oz (73.2 kg)    Physical Exam Constitutional:      Appearance: Normal appearance. He is normal weight.  Cardiovascular:     Rate and Rhythm: Normal rate and regular rhythm.     Heart sounds: Normal heart sounds.  Pulmonary:     Effort: Pulmonary effort is normal.      Breath sounds: Normal breath sounds.  Abdominal:     General: Bowel sounds are normal.     Palpations: Abdomen is soft.  Musculoskeletal:        General: Normal range of motion.  Skin:    General: Skin is warm and dry.  Neurological:     Mental Status: He is alert and oriented to person, place, and time. Mental status is at baseline.  Psychiatric:        Mood and Affect: Mood normal.        Behavior: Behavior normal.        Thought Content: Thought content normal.        Judgment: Judgment normal.      LABORATORY DATA:  I have reviewed the labs as listed.  CBC    Component Value Date/Time   WBC 6.4 09/06/2019 1119   RBC 3.71 (L) 09/06/2019 1119   HGB 11.1 (L) 09/06/2019 1119   HCT 36.0 (L) 09/06/2019 1119   PLT 295 09/06/2019 1119   MCV 97.0 09/06/2019 1119   MCH 29.9 09/06/2019 1119   MCHC 30.8 09/06/2019 1119   RDW 17.2 (H) 09/06/2019 1119   LYMPHSABS 1.4 09/06/2019 1119   MONOABS 0.7 09/06/2019 1119   EOSABS 0.0 09/06/2019 1119   BASOSABS 0.0 09/06/2019 1119   CMP Latest Ref Rng & Units 09/06/2019 08/16/2019 07/16/2019  Glucose 70 - 99 mg/dL 100(H) 87 101(H)  BUN 8 - 23 mg/dL 19 17 25(H)  Creatinine 0.61 - 1.24 mg/dL 1.81(H) 1.77(H) 2.17(H)  Sodium 135 - 145 mmol/L 136 136 134(L)  Potassium 3.5 - 5.1 mmol/L 5.1 4.5 5.4(H)  Chloride 98 - 111 mmol/L 105 103 101  CO2 22 - 32 mmol/L _0 Calcium 8.9 - 10.3 mg/dL 8.7(L) 8.4(L) 8.8(L)  Total Protein 6.5 - 8.1 g/dL 7.6 7.6 8.3(H)  Total Bilirubin 0.3 - 1.2 mg/dL 0.4 0.4 0.4  Alkaline Phos 38 - 126 U/L 54 50 56  AST 15 - 41 U/L _1 ALT 0 - 44  U/L _0 I have reviewed scans.   ASSESSMENT & PLAN:   Malignant neoplasm of prostate ([place]) 1.  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 -Baseline PSA is between 0.7 and 1. -PSA 1.09 on 08/16/2019, 1.06 on 07/16/2019, 1.27 on 06/25/2019. -Bone scan on 08/03/2019 showed uptake in the  right mandible slightly more progressive than prior study in April 2019.  Multiple additional sites in the ribs and pelvis are no longer identified.  Uptake in left maxilla, question dental disease. -CT CAP from 08/03/2019 did not show any significant change from August 2018 CT study.  No new or progressive disease.  Stable extensive sclerotic bone lesions throughout the axial and proximal appendicular skeleton.  Chronic pathological mid T3 vertebral fracture: No pathologically enlarged abdominal lymph nodes. -As the PSA is remaining around 1, will continue cabazitaxel.  I have reviewed his labs including LFTs today which are adequate.  We will reevaluate him in 3 weeks.  2.  Bone metastasis: -Continue to hold denosumab.  3.  Renal insufficiency: -Baseline creatinine between 1.8-2.0.  This is stable.  4.  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  Hemoglobin today 11.1.  We will plan to check iron panel and ferritin next time.  5.  Vitamin D deficiency: -We will repeat vitamin D levels.  If they are low we will start him on prescription vitamin D.     Orders placed this encounter:  No orders of the defined types were placed in this encounter.     Derek Jack, MD [facility] [phone]

## 2019-09-06 NOTE — Progress Notes (Signed)
Labs reviewed by MD today. No new complaints today per patient. Ok to treat per MD. Creatinine 1.81 noted.   B12 injection given per orders, see MAR.   Jeff KitchenAdriana [name] arrived today for [facility] neulasta on body injector. See MAR for administration details. Injector in place and engaged with green light indicator on flashing. Tolerated application with out problems.  Treatment given per orders. Patient tolerated it well without problems. Vitals stable and discharged home from clinic ambulatory. Follow up as scheduled.

## 2019-09-07 MED ORDER — PEGFILGRASTIM-JMDB 6 MG/0.6ML ~~LOC~~ SOSY
PREFILLED_SYRINGE | SUBCUTANEOUS | Status: AC
  Filled 2019-09-07: qty 0.6

## 2019-09-25 NOTE — Progress Notes (Signed)

## 2019-09-27 ENCOUNTER — Inpatient Hospital Stay (HOSPITAL_COMMUNITY): Payer: Medicare Other

## 2019-09-27 ENCOUNTER — Encounter (HOSPITAL_COMMUNITY): Payer: Self-pay | Admitting: Hematology

## 2019-09-27 ENCOUNTER — Inpatient Hospital Stay (HOSPITAL_COMMUNITY): Payer: Medicare Other | Attending: Hematology

## 2019-09-27 ENCOUNTER — Inpatient Hospital Stay (HOSPITAL_BASED_OUTPATIENT_CLINIC_OR_DEPARTMENT_OTHER): Payer: Medicare Other | Admitting: Hematology

## 2019-09-27 ENCOUNTER — Other Ambulatory Visit: Payer: Self-pay

## 2019-09-27 VITALS — BP 168/101 | HR 96 | Temp 96.7°F | Resp 19 | Wt 158.1 lb

## 2019-09-27 VITALS — BP 109/70 | HR 76 | Temp 97.5°F | Resp 18

## 2019-09-27 DIAGNOSIS — C7951 Secondary malignant neoplasm of bone: Secondary | ICD-10-CM | POA: Diagnosis not present

## 2019-09-27 DIAGNOSIS — N189 Chronic kidney disease, unspecified: Secondary | ICD-10-CM | POA: Insufficient documentation

## 2019-09-27 DIAGNOSIS — F17210 Nicotine dependence, cigarettes, uncomplicated: Secondary | ICD-10-CM | POA: Insufficient documentation

## 2019-09-27 DIAGNOSIS — R590 Localized enlarged lymph nodes: Secondary | ICD-10-CM | POA: Diagnosis not present

## 2019-09-27 DIAGNOSIS — C61 Malignant neoplasm of prostate: Secondary | ICD-10-CM

## 2019-09-27 DIAGNOSIS — Z9221 Personal history of antineoplastic chemotherapy: Secondary | ICD-10-CM | POA: Insufficient documentation

## 2019-09-27 DIAGNOSIS — M899 Disorder of bone, unspecified: Secondary | ICD-10-CM | POA: Diagnosis not present

## 2019-09-27 DIAGNOSIS — E559 Vitamin D deficiency, unspecified: Secondary | ICD-10-CM | POA: Diagnosis not present

## 2019-09-27 DIAGNOSIS — D6489 Other specified anemias: Secondary | ICD-10-CM | POA: Diagnosis not present

## 2019-09-27 DIAGNOSIS — Z5111 Encounter for antineoplastic chemotherapy: Secondary | ICD-10-CM | POA: Diagnosis not present

## 2019-09-27 DIAGNOSIS — Z5189 Encounter for other specified aftercare: Secondary | ICD-10-CM | POA: Diagnosis not present

## 2019-09-27 DIAGNOSIS — Z9079 Acquired absence of other genital organ(s): Secondary | ICD-10-CM | POA: Diagnosis not present

## 2019-09-27 LAB — COMPREHENSIVE METABOLIC PANEL
ALT: 23 U/L (ref 0–44)
AST: 24 U/L (ref 15–41)
Albumin: 4.1 g/dL (ref 3.5–5.0)
Alkaline Phosphatase: 51 U/L (ref 38–126)
Anion gap: 10 (ref 5–15)
BUN: 23 mg/dL (ref 8–23)
CO2: 22 mmol/L (ref 22–32)
Calcium: 8.7 mg/dL — ABNORMAL LOW (ref 8.9–10.3)
Chloride: 102 mmol/L (ref 98–111)
Creatinine, Ser: 1.74 mg/dL — ABNORMAL HIGH (ref 0.61–1.24)
GFR calc Af Amer: 44 mL/min — ABNORMAL LOW (ref >60)
GFR calc non Af Amer: 38 mL/min — ABNORMAL LOW (ref >60)
Glucose, Bld: 94 mg/dL (ref 70–99)
Potassium: 4.5 mmol/L (ref 3.5–5.1)
Sodium: 134 mmol/L — ABNORMAL LOW (ref 135–145)
Total Bilirubin: 0.4 mg/dL (ref 0.3–1.2)
Total Protein: 8 g/dL (ref 6.5–8.1)

## 2019-09-27 LAB — CBC WITH DIFFERENTIAL/PLATELET
Abs Immature Granulocytes: 0.01 10*3/uL (ref 0.00–0.07)
Basophils Absolute: 0 10*3/uL (ref 0.0–0.1)
Basophils Relative: 0 %
Eosinophils Absolute: 0 10*3/uL (ref 0.0–0.5)
Eosinophils Relative: 0 %
HCT: 36.2 % — ABNORMAL LOW (ref 39.0–52.0)
Hemoglobin: 11.6 g/dL — ABNORMAL LOW (ref 13.0–17.0)
Immature Granulocytes: 0 %
Lymphocytes Relative: 20 %
Lymphs Abs: 1.4 10*3/uL (ref 0.7–4.0)
MCH: 30.4 pg (ref 26.0–34.0)
MCHC: 32 g/dL (ref 30.0–36.0)
MCV: 94.8 fL (ref 80.0–100.0)
Monocytes Absolute: 0.5 10*3/uL (ref 0.1–1.0)
Monocytes Relative: 7 %
Neutro Abs: 5.3 10*3/uL (ref 1.7–7.7)
Neutrophils Relative %: 73 %
Platelets: 343 10*3/uL (ref 150–400)
RBC: 3.82 MIL/uL — ABNORMAL LOW (ref 4.22–5.81)
RDW: 17.2 % — ABNORMAL HIGH (ref 11.5–15.5)
WBC: 7.3 10*3/uL (ref 4.0–10.5)
nRBC: 0 % (ref 0.0–0.2)

## 2019-09-27 LAB — PSA: Prostatic Specific Antigen: 1.49 ng/mL (ref 0.00–4.00)

## 2019-09-27 MED ORDER — SODIUM CHLORIDE 0.9 % IV SOLN: Freq: Once | INTRAVENOUS | Status: AC

## 2019-09-27 MED ORDER — PEGFILGRASTIM 6 MG/0.6ML ~~LOC~~ PSKT
6.0000 mg | PREFILLED_SYRINGE | Freq: Once | SUBCUTANEOUS | Status: AC
  Administered 2019-09-27: 6 mg via SUBCUTANEOUS
  Filled 2019-09-27: qty 0.6

## 2019-09-27 MED ORDER — SODIUM CHLORIDE 0.9 % IV SOLN
20.0000 mg/m2 | Freq: Once | INTRAVENOUS | Status: AC
  Administered 2019-09-27: 41 mg via INTRAVENOUS
  Filled 2019-09-27: qty 4.1

## 2019-09-27 MED ORDER — FAMOTIDINE IN NACL 20-0.9 MG/50ML-% IV SOLN
20.0000 mg | Freq: Once | INTRAVENOUS | Status: AC
  Administered 2019-09-27: 20 mg via INTRAVENOUS

## 2019-09-27 MED ORDER — DIPHENHYDRAMINE HCL 50 MG/ML IJ SOLN
INTRAMUSCULAR | Status: AC
  Filled 2019-09-27: qty 1

## 2019-09-27 MED ORDER — HEPARIN SOD (PORK) LOCK FLUSH 100 UNIT/ML IV SOLN
500.0000 [IU] | Freq: Once | INTRAVENOUS | Status: AC | PRN
  Administered 2019-09-27: 500 [IU]

## 2019-09-27 MED ORDER — DIPHENHYDRAMINE HCL 50 MG/ML IJ SOLN
25.0000 mg | Freq: Once | INTRAMUSCULAR | Status: AC
  Administered 2019-09-27: 25 mg via INTRAVENOUS

## 2019-09-27 MED ORDER — SODIUM CHLORIDE 0.9% FLUSH
10.0000 mL | INTRAVENOUS | Status: DC | PRN
  Administered 2019-09-27: 10 mL

## 2019-09-27 MED ORDER — SODIUM CHLORIDE 0.9 % IV SOLN
10.0000 mg | Freq: Once | INTRAVENOUS | Status: AC
  Administered 2019-09-27: 10 mg via INTRAVENOUS
  Filled 2019-09-27: qty 10

## 2019-09-27 MED ORDER — FAMOTIDINE IN NACL 20-0.9 MG/50ML-% IV SOLN
INTRAVENOUS | Status: AC
  Filled 2019-09-27: qty 50

## 2019-09-27 NOTE — Progress Notes (Signed)
Patient requesting to stop chemotherapy early due to transportation issues.  Patient tolerated chemotherapy with no complaints voiced.  Side effects with management reviewed with understanding verbalized.  Port site clean and dry with no bruising or swelling noted at site.  No blood return noted before and after administration of chemotherapy.  Band aid applied. Neulasta onpro applied with green indicator light flashing.  Patient left ambulatory with VSS and no s/s of distress noted.

## 2019-09-27 NOTE — Progress Notes (Signed)
[place], [place] [address]   CLINIC:  Medical Oncology/Hematology  PCP:  Holley Bouche, NP (Inactive) [address] / [place] [address] [phone]   REASON FOR VISIT:  Follow-up for prostate cancer  CURRENT THERAPY: Cabazitaxel every 3 weeks  BRIEF ONCOLOGIC HISTORY:  Oncology History  Malignant neoplasm of prostate ([place])  01/26/2014 Tumor Marker   PSA > 5000   01/28/2014 Initial Biopsy   Metastatic adenocarcinoma of prostate   02/05/2014 Surgery   Bilateral orchiectomy by Dr. Junious Silk   02/26/2014 Tumor Marker   PSA = 399.5   03/14/2014 Tumor Marker   PSA= 89.51   03/14/2014 - 06/26/2014 Chemotherapy   Docetaxel 75 mg/kg every 21 days x 6 cycles   04/24/2014 Tumor Marker   PSA- 19.89   07/23/2014 Procedure   Nephrostomy tube removed by IR, Dr. Geroge Baseman   07/24/2014 Tumor Marker   PSA= 6.15   10/16/2014 Tumor Marker   PSA: 3.54    01/08/2015 Tumor Marker   PSA: 2.13    01/17/2015 Imaging   CT CAP-  Massive pelvic and retroperitoneal lymphadenopathy noted on the prior study has nearly completely resolved, now with only a small amount of residual amorphous soft tissue predominantly around the the infrarenal abdominal aorta.   01/17/2015 Imaging   Bone scan- Widespread osseous metastatic disease with multiple foci of increased activity throughout the skeleton, corresponding with the findings on the prior CT from October, 2015.   04/11/2015 Tumor Marker   PSA: 1.87    07/21/2015 Imaging   Bone scan- Bony metastatic disease again noted at multiple sites, stable from prior study. No progression of bony metastatic disease is demonstrable on this study.   07/25/2015 Imaging   CT CAP- Stable matted soft tissue density in the retroperitoneum and extraperitoneal pelvis consistent with treated disease. No recurrent lymphadenopathy.  No pulmonary metastatic disease. Diffuse stable sclerotic metastatic bone disease.     01/14/2016 Imaging   Bone scan-  Multiple sites of abnormal increased tracer localization involving BILATERAL ribs, T11, T12, questionably RIGHT scapula and LEFT iliac bone suspicious for osseous metastases.   01/23/2016 Imaging   CT CAP- 1. Similar widespread osseous metastasis. 2. Similar soft tissue thickening within the retroperitoneum of the abdomen. Improved left pelvic side wall soft tissue thickening. These are consistent with sites of treated disease. No well-defined adenopathy. 3. No new sites of disease.   05/03/2016 -  Chemotherapy   Jevtana every 21 days    05/03/2016 -  Chemotherapy   The patient had pegfilgrastim (NEULASTA) injection 6 mg, 6 mg, Subcutaneous,  Once, 1 of 1 cycle Administration: 6 mg (05/05/2016) pegfilgrastim (NEULASTA ONPRO KIT) injection 6 mg, 6 mg, Subcutaneous, Once, 51 of 54 cycles Administration: 6 mg (05/24/2016), 6 mg (06/14/2016), 6 mg (07/07/2016), 6 mg (07/28/2016), 6 mg (08/18/2016), 6 mg (09/08/2016), 6 mg (09/29/2016), 6 mg (10/20/2016), 6 mg (11/10/2016), 6 mg (02/14/2017), 6 mg (03/07/2017), 6 mg (03/28/2017), 6 mg (04/20/2017), 6 mg (05/11/2017), 6 mg (06/27/2017), 6 mg (08/30/2017), 6 mg (09/20/2017), 6 mg (10/11/2017), 6 mg (11/02/2017), 6 mg (08/09/2017), 6 mg (11/23/2017), 6 mg (12/14/2017), 6 mg (01/09/2018), 6 mg (01/31/2018), 6 mg (04/04/2018), 6 mg (04/27/2018), 6 mg (05/18/2018), 6 mg (06/08/2018), 6 mg (06/29/2018), 6 mg (07/20/2018), 6 mg (08/10/2018), 6 mg (09/01/2018), 6 mg (09/22/2018), 6 mg (10/13/2018), 6 mg (11/03/2018), 6 mg (11/24/2018), 6 mg (12/15/2018), 6 mg (01/05/2019), 6 mg (01/26/2019), 6 mg (02/16/2019), 6 mg (03/12/2019), 6 mg (  04/02/2019), 6 mg (04/23/2019), 6 mg (05/14/2019), 6 mg (06/04/2019), 6 mg (06/25/2019), 6 mg (07/16/2019), 6 mg (08/16/2019), 6 mg (09/06/2019) cabazitaxel (JEVTANA) 41 mg in dextrose 5 % 250 mL chemo infusion, 20 mg/m2 = 41 mg (100 % of original dose 20 mg/m2), Intravenous,  Once, 52 of 55 cycles Dose modification: 20 mg/m2 (original dose 20  mg/m2, Cycle 1, Reason: Dose not tolerated, Comment: recommended by Data to use 64m/m), 20 mg/m2 (original dose 20 mg/m2, Cycle 37, Reason: Other (see comments), Comment: Changed to new orderable), 21 mg/m2 (original dose 20 mg/m2, Cycle 40, Reason: Other (see comments)), 20 mg/m2 (original dose 20 mg/m2, Cycle 40, Reason: Other (see comments)) Administration: 41 mg (05/03/2016), 41 mg (05/24/2016), 41 mg (06/14/2016), 41 mg (07/07/2016), 41 mg (07/28/2016), 41 mg (08/18/2016), 41 mg (09/08/2016), 41 mg (09/29/2016), 41 mg (10/20/2016), 41 mg (11/10/2016), 41 mg (02/14/2017), 41 mg (03/07/2017), 41 mg (03/28/2017), 41 mg (04/20/2017), 41 mg (05/11/2017), 41 mg (06/27/2017), 41 mg (08/30/2017), 41 mg (09/20/2017), 41 mg (10/11/2017), 41 mg (11/02/2017), 41 mg (08/09/2017), 41 mg (11/23/2017), 41 mg (12/14/2017), 41 mg (01/09/2018), 41 mg (01/31/2018), 41 mg (02/21/2018), 41 mg (04/04/2018), 41 mg (04/27/2018), 41 mg (05/18/2018), 41 mg (06/08/2018), 41 mg (06/29/2018), 41 mg (07/20/2018), 41 mg (08/10/2018), 41 mg (09/01/2018), 41 mg (09/22/2018), 41 mg (10/13/2018), 41 mg (11/03/2018), 41 mg (11/24/2018), 41 mg (12/15/2018), 41 mg (01/05/2019), 41 mg (01/26/2019), 41 mg (02/16/2019), 41 mg (03/12/2019), 41 mg (04/02/2019), 41 mg (04/23/2019), 41 mg (05/14/2019), 41 mg (06/04/2019), 41 mg (06/25/2019), 41 mg (07/16/2019), 41 mg (08/16/2019), 41 mg (09/06/2019)  for chemotherapy treatment.    09/06/2016 Imaging   Restaging CT C/A/P: IMPRESSION: 1. Similar appearance of widespread sclerotic bone metastases. 2. No change in soft tissue thickening within the retroperitoneum and left pelvic sidewall. No well defined adenopathy identified. 3. No new sites of disease 4. Aortic Atherosclerosis (ICD10-I70.0). Coronary artery calcifications noted. 5. 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 6. Stable 9 mm pancreatic cystic lesion. Favor pseudocyst. Indolent neoplasm may look similar. Attention on follow-up  imaging.   09/06/2016 Imaging   Bone Scan: IMPRESSION: In this patient with known diffuse sclerotic metastatic disease, bone scan does not reveal progression of radiotracer uptake. Several of the areas of previously demonstrated radiotracer uptake appear less prominent.  Change in appearance of radiotracer uptake involving the mandible now more notable on the right and previously more notable on the left may reflect result of dental disease rather than metastatic disease.    04/08/2017 Tumor Marker   PSA 0.89     CANCER STAGING: Cancer Staging No matching staging information was found for the patient.  INTERVAL HISTORY:  Mr. DKamarrion Stfort a 74y.o. male, returns for routine follow-up and consideration for next cycle of chemotherapy. DCarylwas last seen on 09/06/2019.  Due for cycle #53 of cabazitaxel today.   Overall, he tells me he has been feeling pretty well. He denies any pain in his jaw. He is able to chew and eat and his appetite is normal. He denies any new pain or numbness/tingling.  Overall, he feels ready for next cycle of chemo today.    REVIEW OF SYSTEMS:  Review of Systems  Constitutional: Positive for fatigue (mild). Negative for appetite change.  Neurological: Negative for numbness.  Psychiatric/Behavioral: Positive for sleep disturbance.  All other systems reviewed and are negative.   PAST MEDICAL/SURGICAL HISTORY:  Past Medical History:  Diagnosis Date  . Acute renal failure ([place] 01/26/2014  .  Alcohol abuse   . Anemia of chronic disease 01/26/2014  . Bilateral hydronephrosis 01/26/2014  . History of cocaine abuse ([place]) 01/26/2014  . Homelessness 01/31/2014  . Prostate cancer metastatic to multiple sites ([facility]) 01/30/2014  . Sepsis ([place])   . UTI (lower urinary tract infection)    Past Surgical History:  Procedure Laterality Date  . ORCHIECTOMY Bilateral 02/05/2014   Procedure: BILATERAL ORCHIECTOMY;  Surgeon: Festus Aloe, MD;  Location: [facility];  Service: Urology;  Laterality: Bilateral;  . PERCUTANEOUS NEPHROSTOMY Bilateral    IR Dr. Junious Silk changed on 04/22/2014  . PORT-A-CATH REMOVAL Right 08/08/2017   Procedure: REMOVAL PORT-A-CATH RIGHT CHEST (PROCEDURE #2);  Surgeon: Aviva Signs, MD;  Location: [facility];  Service: General;  Laterality: Right;  . PORTACATH PLACEMENT Right 03/12/14  . PORTACATH PLACEMENT Left 08/08/2017   Procedure: INSERTION POWER PORT WITH ATTACHED CATHETER LEFT SUBCLAVIAN (PROCEDURE #1);  Surgeon: Aviva Signs, MD;  Location: [facility];  Service: General;  Laterality: Left;    SOCIAL HISTORY:  Social History   Socioeconomic History  . Marital status: Single    Spouse name: Not on file  . Number of children: 1  . Years of education: 9th  . Highest education level: Not on file  Occupational History  . Occupation: disabilty    Employer: [employer]  Tobacco Use  . Smoking status: Current Every Day Smoker    Packs/day: 0.25    Years: 55.00    Pack years: 13.75    Types: Cigarettes  . Smokeless tobacco: Never Used  Substance and Sexual Activity  . Alcohol use: Yes    Alcohol/week: 1.0 standard drinks    Types: 1 Cans of beer per week  . Drug use: Yes    Types: Cocaine    Comment: last used Cocaine 01/25/14  . Sexual activity: Yes  Other Topics Concern  . Not on file  Social History Narrative   Lives at [employer] 9[employer] grade   1 son, lives in [place], [place]   Can read and write in native language            Social Determinants of Health   Financial Resource Strain:   . Difficulty of Paying Living Expenses:   Food Insecurity:   . Worried About Charity fundraiser in the Last Year:   . Arboriculturist in the Last Year:   Transportation Needs:   . Film/video editor (Medical):   [name] Kitchen Lack of Transportation (Non-Medical):   Physical Activity:   . Days of Exercise per Week:   . Minutes of Exercise per Session:   Stress:   . Feeling of Stress :   Social  Connections:   . Frequency of Communication with Friends and Family:   . Frequency of Social Gatherings with Friends and Family:   . Attends Religious Services:   . Active Member of Clubs or Organizations:   . Attends Archivist Meetings:   [name] Kitchen Marital Status:   Intimate Partner Violence:   . Fear of Current or Ex-Partner:   . Emotionally Abused:   [name] Kitchen Physically Abused:   . Sexually Abused:     FAMILY HISTORY:  Family History  Problem Relation Age of Onset  . Cancer Brother 6    CURRENT MEDICATIONS:  Current Outpatient Medications  Medication Sig Dispense Refill  . calcium gluconate 500 MG tablet Take 1 tablet by mouth 2 (two) times daily.    . prochlorperazine (COMPAZINE) 10  MG tablet The day after chemo take 1 tab four times a day x 2 days. Then may take 1 tab four times a day if needed for nausea/vomiting. (Patient not taking: Reported on 03/12/2019) 60 tablet 2   No current facility-administered medications for this visit.   Facility-Administered Medications Ordered in Other Visits  Medication Dose Route Frequency Provider Last Rate Last Admin  . cyanocobalamin ((VITAMIN B-12)) injection 1,000 mcg  1,000 mcg Intramuscular Once Kefalas, Thomas S, PA-C        ALLERGIES:  No Known Allergies  PHYSICAL EXAM:  Performance status (ECOG): 1 - Symptomatic but completely ambulatory  There were no vitals filed for this visit. Wt Readings from Last 3 Encounters:  09/06/19 161 lb 6.4 oz (73.2 kg)  08/16/19 163 lb 4.8 oz (74.1 kg)  07/16/19 164 lb 3.2 oz (74.5 kg)   Physical Exam Vitals reviewed.  Constitutional:      Appearance: Normal appearance.  Cardiovascular:     Rate and Rhythm: Normal rate and regular rhythm.     Pulses: Normal pulses.     Heart sounds: Normal heart sounds.  Pulmonary:     Effort: Pulmonary effort is normal.     Breath sounds: Normal breath sounds.  Neurological:     General: No focal deficit present.     Mental Status: He is alert and  oriented to person, place, and time.  Psychiatric:        Mood and Affect: Mood normal.        Behavior: Behavior normal.     LABORATORY DATA:  I have reviewed the labs as listed.  CBC Latest Ref Rng & Units 09/06/2019 08/16/2019 07/16/2019  WBC 4.0 - 10.5 K/uL 6.4 5.3 5.2  Hemoglobin 13.0 - 17.0 g/dL 11.1(L) 10.5(L) 10.6(L)  Hematocrit 39.0 - 52.0 % 36.0(L) 33.7(L) 33.6(L)  Platelets 150 - 400 K/uL 295 309 341   CMP Latest Ref Rng & Units 09/06/2019 08/16/2019 07/16/2019  Glucose 70 - 99 mg/dL 100(H) 87 101(H)  BUN 8 - 23 mg/dL 19 17 25(H)  Creatinine 0.61 - 1.24 mg/dL 1.81(H) 1.77(H) 2.17(H)  Sodium 135 - 145 mmol/L 136 136 134(L)  Potassium 3.5 - 5.1 mmol/L 5.1 4.5 5.4(H)  Chloride 98 - 111 mmol/L 105 103 101  CO2 22 - 32 mmol/L _0 Calcium 8.9 - 10.3 mg/dL 8.7(L) 8.4(L) 8.8(L)  Total Protein 6.5 - 8.1 g/dL 7.6 7.6 8.3(H)  Total Bilirubin 0.3 - 1.2 mg/dL 0.4 0.4 0.4  Alkaline Phos 38 - 126 U/L 54 50 56  AST 15 - 41 U/L _1 ALT 0 - 44 U/L _2 DIAGNOSTIC IMAGING:  I have independently reviewed the scans and discussed with the patient. No results found.   ASSESSMENT:  1.  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 -Baseline PSA between 0.7 and 1. -Recent up and downs in the PSA up to a maximum of 1.29. -Bone scan on 08/03/2019 showed uptake in the right mandible, slightly more progressive than prior study in April 2019.  Multiple additional studies in the ribs and pelvis are not identified.  Uptake in the left maxilla, question dental disease. -CT CAP on 08/03/2019 did not show any significant change from August 2018 CT study.  No new or progressive disease.  Stable extensive sclerotic bone lesions throughout the axial and proximal appendicular skeleton.  Chronic pathological mid T3 vertebral fracture.  No pathologically enlarged  abdominal lymph nodes.    PLAN:  1.  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  His PSA has gone up to 1.29.  This has gone up in the past and has come down. -He will proceed with cabazitaxel today.  He will also receive treatment in 3 weeks unless there is drastic increase in PSA. -I will schedule him for a follow-up visit in 6 weeks.  I will schedule for a CT scan of the maxillofacial to evaluate for uptake in the right mandible.  2.  Bone metastasis: -No pain or tenderness in the right mandible.  There was sensitivity in the left maxillary region.  We will continue to hold denosumab.  Last dose was in March.  3.  Renal insufficiency: -CKD stable with creatinine of 1.74 today.  Baseline creatinine 1.8-2.0.  4.  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  Hemoglobin ranging between 11 and 12.  If any worsening, will consider anemia panel.  5.  Vitamin D deficiency: -We will consider repeating vitamin D.  If severely low, consider prescription vitamin D.    Orders placed this encounter:  No orders of the defined types were placed in this encounter.    Derek Jack, MD [facility] [phone]   I, Jacqualyn Posey, am acting as a scribe for Dr. Sanda Linger.  I, Derek Jack MD, have reviewed the above documentation for accuracy and completeness, and I agree with the above.

## 2019-09-27 NOTE — Patient Instructions (Signed)
[place] at [facility] Discharge Instructions  You were seen today by Dr. Delton Coombes. He went over your recent results. You will get a CT scan of your jaw scheduled before your next visit. Dr. Delton Coombes will see you back in 6 weeks for labs and follow up.   Thank you for choosing [place] at [facility] to provide your oncology and hematology care.  To afford each patient quality time with our provider, please arrive at least 15 minutes before your scheduled appointment time.   If you have a lab appointment with the [facility] please come in thru the  Main Entrance and check in at the main information desk  You need to re-schedule your appointment should you arrive 10 or more minutes late.  We strive to give you quality time with our providers, and arriving late affects you and other patients whose appointments are after yours.  Also, if you no show three or more times for appointments you may be dismissed from the clinic at the providers discretion.     Again, thank you for choosing [facility].  Our hope is that these requests will decrease the amount of time that you wait before being seen by our physicians.       _____________________________________________________________  Should you have questions after your visit to [facility], please contact our office at [phone] between the hours of 8:00 a.m. and 4:30 p.m.  Voicemails left after 4:00 p.m. will not be returned until the following business day.  For prescription refill requests, have your pharmacy contact our office and allow 72 hours.    Cancer Center Support Programs:   > Cancer Support Group  2nd Tuesday of the month 1pm-2pm, Journey Room

## 2019-09-27 NOTE — Progress Notes (Signed)
Patient has been assessed, vital signs and labs have been reviewed by Dr. Katragadda. ANC, Creatinine, LFTs, and Platelets are within treatment parameters per Dr. Katragadda. The patient is good to proceed with treatment at this time.  

## 2019-10-18 ENCOUNTER — Inpatient Hospital Stay (HOSPITAL_COMMUNITY): Payer: Medicare Other

## 2019-10-18 ENCOUNTER — Encounter (HOSPITAL_COMMUNITY): Payer: Self-pay

## 2019-10-18 ENCOUNTER — Other Ambulatory Visit: Payer: Self-pay

## 2019-10-18 VITALS — BP 92/54 | HR 67 | Temp 97.5°F | Resp 18 | Wt 158.0 lb

## 2019-10-18 DIAGNOSIS — R590 Localized enlarged lymph nodes: Secondary | ICD-10-CM | POA: Diagnosis not present

## 2019-10-18 DIAGNOSIS — E538 Deficiency of other specified B group vitamins: Secondary | ICD-10-CM

## 2019-10-18 DIAGNOSIS — D6489 Other specified anemias: Secondary | ICD-10-CM | POA: Diagnosis not present

## 2019-10-18 DIAGNOSIS — M899 Disorder of bone, unspecified: Secondary | ICD-10-CM

## 2019-10-18 DIAGNOSIS — C61 Malignant neoplasm of prostate: Secondary | ICD-10-CM

## 2019-10-18 DIAGNOSIS — C7951 Secondary malignant neoplasm of bone: Secondary | ICD-10-CM | POA: Diagnosis not present

## 2019-10-18 DIAGNOSIS — N189 Chronic kidney disease, unspecified: Secondary | ICD-10-CM | POA: Diagnosis not present

## 2019-10-18 DIAGNOSIS — Z5111 Encounter for antineoplastic chemotherapy: Secondary | ICD-10-CM | POA: Diagnosis not present

## 2019-10-18 DIAGNOSIS — Z9221 Personal history of antineoplastic chemotherapy: Secondary | ICD-10-CM | POA: Diagnosis not present

## 2019-10-18 DIAGNOSIS — E559 Vitamin D deficiency, unspecified: Secondary | ICD-10-CM | POA: Diagnosis not present

## 2019-10-18 DIAGNOSIS — Z5189 Encounter for other specified aftercare: Secondary | ICD-10-CM | POA: Diagnosis not present

## 2019-10-18 DIAGNOSIS — F17210 Nicotine dependence, cigarettes, uncomplicated: Secondary | ICD-10-CM | POA: Diagnosis not present

## 2019-10-18 LAB — CBC WITH DIFFERENTIAL/PLATELET
Abs Immature Granulocytes: 0.01 10*3/uL (ref 0.00–0.07)
Basophils Absolute: 0 10*3/uL (ref 0.0–0.1)
Basophils Relative: 0 %
Eosinophils Absolute: 0 10*3/uL (ref 0.0–0.5)
Eosinophils Relative: 1 %
HCT: 33.8 % — ABNORMAL LOW (ref 39.0–52.0)
Hemoglobin: 10.6 g/dL — ABNORMAL LOW (ref 13.0–17.0)
Immature Granulocytes: 0 %
Lymphocytes Relative: 30 %
Lymphs Abs: 1.5 10*3/uL (ref 0.7–4.0)
MCH: 29.9 pg (ref 26.0–34.0)
MCHC: 31.4 g/dL (ref 30.0–36.0)
MCV: 95.5 fL (ref 80.0–100.0)
Monocytes Absolute: 0.5 10*3/uL (ref 0.1–1.0)
Monocytes Relative: 10 %
Neutro Abs: 2.9 10*3/uL (ref 1.7–7.7)
Neutrophils Relative %: 59 %
Platelets: 322 10*3/uL (ref 150–400)
RBC: 3.54 MIL/uL — ABNORMAL LOW (ref 4.22–5.81)
RDW: 17 % — ABNORMAL HIGH (ref 11.5–15.5)
WBC: 4.9 10*3/uL (ref 4.0–10.5)
nRBC: 0 % (ref 0.0–0.2)

## 2019-10-18 LAB — COMPREHENSIVE METABOLIC PANEL
ALT: 13 U/L (ref 0–44)
AST: 20 U/L (ref 15–41)
Albumin: 3.8 g/dL (ref 3.5–5.0)
Alkaline Phosphatase: 53 U/L (ref 38–126)
Anion gap: 10 (ref 5–15)
BUN: 13 mg/dL (ref 8–23)
CO2: 22 mmol/L (ref 22–32)
Calcium: 8.4 mg/dL — ABNORMAL LOW (ref 8.9–10.3)
Chloride: 103 mmol/L (ref 98–111)
Creatinine, Ser: 1.55 mg/dL — ABNORMAL HIGH (ref 0.61–1.24)
GFR calc Af Amer: 51 mL/min — ABNORMAL LOW (ref >60)
GFR calc non Af Amer: 44 mL/min — ABNORMAL LOW (ref >60)
Glucose, Bld: 91 mg/dL (ref 70–99)
Potassium: 4.3 mmol/L (ref 3.5–5.1)
Sodium: 135 mmol/L (ref 135–145)
Total Bilirubin: 0.3 mg/dL (ref 0.3–1.2)
Total Protein: 8.2 g/dL — ABNORMAL HIGH (ref 6.5–8.1)

## 2019-10-18 LAB — MAGNESIUM: Magnesium: 2.1 mg/dL (ref 1.7–2.4)

## 2019-10-18 MED ORDER — SODIUM CHLORIDE 0.9 % IV SOLN: Freq: Once | INTRAVENOUS | Status: AC

## 2019-10-18 MED ORDER — PEGFILGRASTIM 6 MG/0.6ML ~~LOC~~ PSKT
6.0000 mg | PREFILLED_SYRINGE | Freq: Once | SUBCUTANEOUS | Status: AC
  Administered 2019-10-18: 6 mg via SUBCUTANEOUS
  Filled 2019-10-18: qty 0.6

## 2019-10-18 MED ORDER — HEPARIN SOD (PORK) LOCK FLUSH 100 UNIT/ML IV SOLN
500.0000 [IU] | Freq: Once | INTRAVENOUS | Status: AC | PRN
  Administered 2019-10-18: 500 [IU]

## 2019-10-18 MED ORDER — DIPHENHYDRAMINE HCL 50 MG/ML IJ SOLN
25.0000 mg | Freq: Once | INTRAMUSCULAR | Status: AC
  Administered 2019-10-18: 25 mg via INTRAVENOUS
  Filled 2019-10-18: qty 1

## 2019-10-18 MED ORDER — CYANOCOBALAMIN 1000 MCG/ML IJ SOLN
1000.0000 ug | Freq: Once | INTRAMUSCULAR | Status: AC
  Administered 2019-10-18: 1000 ug via INTRAMUSCULAR
  Filled 2019-10-18: qty 1

## 2019-10-18 MED ORDER — SODIUM CHLORIDE 0.9 % IV SOLN
20.0000 mg/m2 | Freq: Once | INTRAVENOUS | Status: AC
  Administered 2019-10-18: 41 mg via INTRAVENOUS
  Filled 2019-10-18: qty 4.1

## 2019-10-18 MED ORDER — FAMOTIDINE IN NACL 20-0.9 MG/50ML-% IV SOLN
20.0000 mg | Freq: Once | INTRAVENOUS | Status: AC
  Administered 2019-10-18: 20 mg via INTRAVENOUS
  Filled 2019-10-18: qty 50

## 2019-10-18 MED ORDER — SODIUM CHLORIDE 0.9% FLUSH
10.0000 mL | INTRAVENOUS | Status: DC | PRN
  Administered 2019-10-18: 10 mL

## 2019-10-18 MED ORDER — SODIUM CHLORIDE 0.9 % IV SOLN
10.0000 mg | Freq: Once | INTRAVENOUS | Status: AC
  Administered 2019-10-18: 10 mg via INTRAVENOUS
  Filled 2019-10-18: qty 10

## 2019-10-18 NOTE — Progress Notes (Signed)
Serum creatinine 1.55 today with verbal order ok to treat Kirby Crigler, PA.    Patient tolerated chemotherapy with no complaints voiced.  Side effects with management reviewed with understanding verbalized.  Port site clean and dry with no bruising or swelling noted at site.  Good blood return noted before and after administration of chemotherapy.  Band aid applied.  Neulasta Onpro applied with green indicator light flashing.  Patient left ambulatory with VSS and no s/s of distress noted.

## 2019-10-25 ENCOUNTER — Inpatient Hospital Stay (HOSPITAL_COMMUNITY)
Admission: EM | Admit: 2019-10-25 | Discharge: 2019-11-03 | DRG: 064 | Disposition: A | Discharge status: Rehabilitation Facility | Payer: Medicare Other | Attending: Internal Medicine | Admitting: Internal Medicine

## 2019-10-25 ENCOUNTER — Other Ambulatory Visit: Payer: Self-pay

## 2019-10-25 ENCOUNTER — Encounter (HOSPITAL_COMMUNITY): Payer: Self-pay

## 2019-10-25 ENCOUNTER — Emergency Department (HOSPITAL_COMMUNITY): Payer: Medicare Other

## 2019-10-25 DIAGNOSIS — I959 Hypotension, unspecified: Secondary | ICD-10-CM | POA: Diagnosis not present

## 2019-10-25 DIAGNOSIS — I6523 Occlusion and stenosis of bilateral carotid arteries: Secondary | ICD-10-CM | POA: Diagnosis not present

## 2019-10-25 DIAGNOSIS — I69351 Hemiplegia and hemiparesis following cerebral infarction affecting right dominant side: Secondary | ICD-10-CM | POA: Diagnosis not present

## 2019-10-25 DIAGNOSIS — Z9079 Acquired absence of other genital organ(s): Secondary | ICD-10-CM | POA: Diagnosis not present

## 2019-10-25 DIAGNOSIS — F1410 Cocaine abuse, uncomplicated: Secondary | ICD-10-CM | POA: Diagnosis present

## 2019-10-25 DIAGNOSIS — R29706 NIHSS score 6: Secondary | ICD-10-CM | POA: Diagnosis not present

## 2019-10-25 DIAGNOSIS — Z743 Need for continuous supervision: Secondary | ICD-10-CM | POA: Diagnosis not present

## 2019-10-25 DIAGNOSIS — I639 Cerebral infarction, unspecified: Secondary | ICD-10-CM | POA: Diagnosis not present

## 2019-10-25 DIAGNOSIS — E871 Hypo-osmolality and hyponatremia: Secondary | ICD-10-CM | POA: Diagnosis not present

## 2019-10-25 DIAGNOSIS — Z8546 Personal history of malignant neoplasm of prostate: Secondary | ICD-10-CM | POA: Diagnosis not present

## 2019-10-25 DIAGNOSIS — R471 Dysarthria and anarthria: Secondary | ICD-10-CM | POA: Diagnosis not present

## 2019-10-25 DIAGNOSIS — G9341 Metabolic encephalopathy: Secondary | ICD-10-CM | POA: Diagnosis not present

## 2019-10-25 DIAGNOSIS — N1832 Chronic kidney disease, stage 3b: Secondary | ICD-10-CM | POA: Diagnosis present

## 2019-10-25 DIAGNOSIS — R4781 Slurred speech: Secondary | ICD-10-CM | POA: Diagnosis not present

## 2019-10-25 DIAGNOSIS — E538 Deficiency of other specified B group vitamins: Secondary | ICD-10-CM | POA: Diagnosis not present

## 2019-10-25 DIAGNOSIS — Z23 Encounter for immunization: Secondary | ICD-10-CM | POA: Diagnosis not present

## 2019-10-25 DIAGNOSIS — G936 Cerebral edema: Secondary | ICD-10-CM | POA: Diagnosis not present

## 2019-10-25 DIAGNOSIS — E785 Hyperlipidemia, unspecified: Secondary | ICD-10-CM | POA: Diagnosis present

## 2019-10-25 DIAGNOSIS — I5042 Chronic combined systolic (congestive) and diastolic (congestive) heart failure: Secondary | ICD-10-CM | POA: Diagnosis not present

## 2019-10-25 DIAGNOSIS — I63412 Cerebral infarction due to embolism of left middle cerebral artery: Secondary | ICD-10-CM | POA: Diagnosis not present

## 2019-10-25 DIAGNOSIS — F1010 Alcohol abuse, uncomplicated: Secondary | ICD-10-CM | POA: Diagnosis present

## 2019-10-25 DIAGNOSIS — C61 Malignant neoplasm of prostate: Secondary | ICD-10-CM | POA: Diagnosis present

## 2019-10-25 DIAGNOSIS — F17210 Nicotine dependence, cigarettes, uncomplicated: Secondary | ICD-10-CM | POA: Diagnosis present

## 2019-10-25 DIAGNOSIS — D638 Anemia in other chronic diseases classified elsewhere: Secondary | ICD-10-CM | POA: Diagnosis not present

## 2019-10-25 DIAGNOSIS — R29810 Facial weakness: Secondary | ICD-10-CM | POA: Diagnosis not present

## 2019-10-25 DIAGNOSIS — I5021 Acute systolic (congestive) heart failure: Secondary | ICD-10-CM | POA: Diagnosis not present

## 2019-10-25 DIAGNOSIS — G319 Degenerative disease of nervous system, unspecified: Secondary | ICD-10-CM | POA: Diagnosis not present

## 2019-10-25 DIAGNOSIS — R531 Weakness: Secondary | ICD-10-CM | POA: Diagnosis not present

## 2019-10-25 DIAGNOSIS — C7951 Secondary malignant neoplasm of bone: Secondary | ICD-10-CM | POA: Diagnosis present

## 2019-10-25 DIAGNOSIS — Z20822 Contact with and (suspected) exposure to covid-19: Secondary | ICD-10-CM | POA: Diagnosis present

## 2019-10-25 DIAGNOSIS — I130 Hypertensive heart and chronic kidney disease with heart failure and stage 1 through stage 4 chronic kidney disease, or unspecified chronic kidney disease: Secondary | ICD-10-CM | POA: Diagnosis not present

## 2019-10-25 DIAGNOSIS — R262 Difficulty in walking, not elsewhere classified: Secondary | ICD-10-CM | POA: Diagnosis not present

## 2019-10-25 DIAGNOSIS — R29818 Other symptoms and signs involving the nervous system: Secondary | ICD-10-CM | POA: Diagnosis not present

## 2019-10-25 DIAGNOSIS — G8191 Hemiplegia, unspecified affecting right dominant side: Secondary | ICD-10-CM | POA: Diagnosis present

## 2019-10-25 DIAGNOSIS — Z8673 Personal history of transient ischemic attack (TIA), and cerebral infarction without residual deficits: Secondary | ICD-10-CM | POA: Diagnosis not present

## 2019-10-25 DIAGNOSIS — I6389 Other cerebral infarction: Secondary | ICD-10-CM | POA: Diagnosis not present

## 2019-10-25 DIAGNOSIS — N183 Chronic kidney disease, stage 3 unspecified: Secondary | ICD-10-CM

## 2019-10-25 DIAGNOSIS — Z79899 Other long term (current) drug therapy: Secondary | ICD-10-CM

## 2019-10-25 DIAGNOSIS — I951 Orthostatic hypotension: Secondary | ICD-10-CM | POA: Diagnosis not present

## 2019-10-25 LAB — COMPREHENSIVE METABOLIC PANEL
ALT: 14 U/L (ref 0–44)
AST: 27 U/L (ref 15–41)
Albumin: 3.9 g/dL (ref 3.5–5.0)
Alkaline Phosphatase: 48 U/L (ref 38–126)
Anion gap: 9 (ref 5–15)
BUN: 28 mg/dL — ABNORMAL HIGH (ref 8–23)
CO2: 21 mmol/L — ABNORMAL LOW (ref 22–32)
Calcium: 9.2 mg/dL (ref 8.9–10.3)
Chloride: 108 mmol/L (ref 98–111)
Creatinine, Ser: 1.63 mg/dL — ABNORMAL HIGH (ref 0.61–1.24)
GFR calc Af Amer: 48 mL/min — ABNORMAL LOW (ref >60)
GFR calc non Af Amer: 41 mL/min — ABNORMAL LOW (ref >60)
Glucose, Bld: 92 mg/dL (ref 70–99)
Potassium: 4.4 mmol/L (ref 3.5–5.1)
Sodium: 138 mmol/L (ref 135–145)
Total Bilirubin: 0.7 mg/dL (ref 0.3–1.2)
Total Protein: 8.2 g/dL — ABNORMAL HIGH (ref 6.5–8.1)

## 2019-10-25 LAB — I-STAT CHEM 8, ED
BUN: 29 mg/dL — ABNORMAL HIGH (ref 8–23)
Calcium, Ion: 1.26 mmol/L (ref 1.15–1.40)
Chloride: 111 mmol/L (ref 98–111)
Creatinine, Ser: 1.8 mg/dL — ABNORMAL HIGH (ref 0.61–1.24)
Glucose, Bld: 92 mg/dL (ref 70–99)
HCT: 32 % — ABNORMAL LOW (ref 39.0–52.0)
Hemoglobin: 10.9 g/dL — ABNORMAL LOW (ref 13.0–17.0)
Potassium: 4.4 mmol/L (ref 3.5–5.1)
Sodium: 143 mmol/L (ref 135–145)
TCO2: 21 mmol/L — ABNORMAL LOW (ref 22–32)

## 2019-10-25 LAB — DIFFERENTIAL
Abs Immature Granulocytes: 0.05 10*3/uL (ref 0.00–0.07)
Basophils Absolute: 0 10*3/uL (ref 0.0–0.1)
Basophils Relative: 1 %
Eosinophils Absolute: 0 10*3/uL (ref 0.0–0.5)
Eosinophils Relative: 0 %
Immature Granulocytes: 1 %
Lymphocytes Relative: 21 %
Lymphs Abs: 0.9 10*3/uL (ref 0.7–4.0)
Monocytes Absolute: 0.2 10*3/uL (ref 0.1–1.0)
Monocytes Relative: 4 %
Neutro Abs: 3 10*3/uL (ref 1.7–7.7)
Neutrophils Relative %: 73 %

## 2019-10-25 LAB — CBC
HCT: 31.7 % — ABNORMAL LOW (ref 39.0–52.0)
Hemoglobin: 10.2 g/dL — ABNORMAL LOW (ref 13.0–17.0)
MCH: 29.8 pg (ref 26.0–34.0)
MCHC: 32.2 g/dL (ref 30.0–36.0)
MCV: 92.7 fL (ref 80.0–100.0)
Platelets: 281 10*3/uL (ref 150–400)
RBC: 3.42 MIL/uL — ABNORMAL LOW (ref 4.22–5.81)
RDW: 17.2 % — ABNORMAL HIGH (ref 11.5–15.5)
WBC: 4.2 10*3/uL (ref 4.0–10.5)
nRBC: 0 % (ref 0.0–0.2)

## 2019-10-25 LAB — ETHANOL: Alcohol, Ethyl (B): <10 mg/dL (ref <10)

## 2019-10-25 LAB — APTT: aPTT: 31 seconds (ref 24–36)

## 2019-10-25 LAB — PROTIME-INR
INR: 1.1 (ref 0.8–1.2)
Prothrombin Time: 13.4 seconds (ref 11.4–15.2)

## 2019-10-25 MED ORDER — LORAZEPAM 2 MG/ML IJ SOLN
INTRAMUSCULAR | Status: AC
  Filled 2019-10-25: qty 1

## 2019-10-25 MED ORDER — SODIUM CHLORIDE 0.9 % IV BOLUS: 500.0000 mL | Freq: Once | INTRAVENOUS | Status: DC

## 2019-10-25 MED ORDER — SODIUM CHLORIDE 0.9 % IV BOLUS
500.0000 mL | Freq: Once | INTRAVENOUS | Status: AC
  Administered 2019-10-25: 500 mL via INTRAVENOUS

## 2019-10-25 MED ORDER — ASPIRIN 325 MG PO TABS
325.0000 mg | ORAL_TABLET | Freq: Once | ORAL | Status: AC
  Administered 2019-10-25: 325 mg via ORAL
  Filled 2019-10-25: qty 1

## 2019-10-25 MED ORDER — ATORVASTATIN CALCIUM 40 MG PO TABS
40.0000 mg | ORAL_TABLET | Freq: Every day | ORAL | Status: DC
  Administered 2019-10-25 – 2019-11-03 (×10): 40 mg via ORAL
  Filled 2019-10-25 (×10): qty 1

## 2019-10-25 MED ORDER — SODIUM CHLORIDE 0.9 % IV BOLUS
1000.0000 mL | Freq: Once | INTRAVENOUS | Status: AC
  Administered 2019-10-25: 1000 mL via INTRAVENOUS

## 2019-10-25 MED ORDER — ASPIRIN EC 81 MG PO TBEC
81.0000 mg | DELAYED_RELEASE_TABLET | Freq: Every day | ORAL | Status: DC
  Administered 2019-10-26 – 2019-11-03 (×9): 81 mg via ORAL
  Filled 2019-10-25 (×10): qty 1

## 2019-10-25 NOTE — ED Notes (Signed)
Pt. With Ct 

## 2019-10-25 NOTE — H&P (Signed)
History and Physical    Jeff [name] EZM:[id number] DOB: December 12, 1945 DOA: 10/25/2019  PCP: Holley Bouche, NP (Inactive)   Patient coming from: Home  I have personally briefly reviewed patient's old medical records in [place]  Chief Complaint: Slurred speech, facial droop, altered mental status.  HPI: Jeff [name] is a 74 y.o. male with medical history significant for cocaine use, prostate cancer, bilateral hydronephrosis metastatic prostate cancer. Patient was brought to the ED via EMS reports of right-sided weakness, facial droop the started yesterday, also slurred speech.  Patient was confused prior to arrival in the ED. patient's friend told EMS that patient seems to have weakness and trouble speaking.  He was last known normal yesterday. At the time of my evaluation, patient is awake alert, answers simple questions, but is not able to give me a lot of details.  He is aware that his speech is abnormal, he tells me this started yesterday.  He is unaware of facial asymmetry and not able to tell me if he has weakness. He denies any difficulty breathing or chest pain, no cough, denies fevers or chills, he denies vomiting or loose stools, denies abdominal pain, no pain with urination.  ED Course: Soft to hypotensive blood pressure, systolic 50-354.  Hemoglobin at baseline 10.2, unremarkable BMP.  Head CT subacute infarcts involving the putamen and caudate nucleus.  Neurologist at [facility]-  Dr. Merlene Laughter not available. EDP Dr. Cheral Marker on-call for neurology at [facility], recommended admission for stroke work-up, patient does not have to be seen by neurologist, patient can be admitted here at [facility] or at [facility].  Patient outside window for any therapeutic intervention.  Review of Systems: As per HPI all other systems reviewed and negative.  Past Medical History:  Diagnosis Date  . Acute renal failure ([place]) 01/26/2014  . Alcohol abuse   . Anemia of chronic disease  01/26/2014  . Bilateral hydronephrosis 01/26/2014  . History of cocaine abuse ([place]) 01/26/2014  . Homelessness 01/31/2014  . Prostate cancer metastatic to multiple sites ([place]) 01/30/2014  . Sepsis ([place])   . UTI (lower urinary tract infection)     Past Surgical History:  Procedure Laterality Date  . ORCHIECTOMY Bilateral 02/05/2014   Procedure: BILATERAL ORCHIECTOMY;  Surgeon: Festus Aloe, MD;  Location: [facility];  Service: Urology;  Laterality: Bilateral;  . PERCUTANEOUS NEPHROSTOMY Bilateral    IR Dr. Junious Silk changed on 04/22/2014  . PORT-A-CATH REMOVAL Right 08/08/2017   Procedure: REMOVAL PORT-A-CATH RIGHT CHEST (PROCEDURE #2);  Surgeon: Aviva Signs, MD;  Location: [facility];  Service: General;  Laterality: Right;  . PORTACATH PLACEMENT Right 03/12/14  . PORTACATH PLACEMENT Left 08/08/2017   Procedure: INSERTION POWER PORT WITH ATTACHED CATHETER LEFT SUBCLAVIAN (PROCEDURE #1);  Surgeon: Aviva Signs, MD;  Location: [facility];  Service: General;  Laterality: Left;     reports that he has been smoking cigarettes. He has a 13.75 pack-year smoking history. He has never used smokeless tobacco. He reports current alcohol use of about 1.0 standard drink of alcohol per week. He reports current drug use. Drug: Cocaine.  No Known Allergies  Family History  Problem Relation Age of Onset  . Cancer Brother 60    Prior to Admission medications   Medication Sig Start Date End Date Taking? Authorizing Provider  calcium gluconate 500 MG tablet Take 1 tablet by mouth 2 (two) times daily.    [provider]  prochlorperazine (COMPAZINE) 10 MG tablet The day after chemo take 1  tab four times a day x 2 days. Then may take 1 tab four times a day if needed for nausea/vomiting. 04/24/14   Baird Cancer, PA-C    Physical Exam: Vitals:   10/25/19 1328 10/25/19 1358 10/25/19 1428 10/25/19 1458  BP: 103/82 110/80 102/72 95/70  Pulse: 87 84 90 70  Resp: 19 18 19 16   Temp:      TempSrc:       SpO2: 98% 99% 97% 100%  Weight:      Height:        Constitutional: NAD, calm, comfortable Vitals:   10/25/19 1328 10/25/19 1358 10/25/19 1428 10/25/19 1458  BP: 103/82 110/80 102/72 95/70  Pulse: 87 84 90 70  Resp: 19 18 19 16   Temp:      TempSrc:      SpO2: 98% 99% 97% 100%  Weight:      Height:       Eyes: PERRL, lids and conjunctivae normal ENMT: Mucous membranes are moist. Posterior pharynx clear of any exudate or lesions.  Neck: normal, supple, no masses, no thyromegaly Respiratory: clear to auscultation bilaterally, no wheezing, no crackles. Normal respiratory effort. No accessory muscle use.  Cardiovascular: Regular rate and rhythm, no murmurs / rubs / gallops. No extremity edema. 2+ pedal pulses.  Abdomen: no tenderness, no masses palpated. No hepatosplenomegaly. Bowel sounds positive.  Musculoskeletal: no clubbing / cyanosis. No joint deformity upper and lower extremities. Good ROM, no contractures. Normal muscle tone.  Skin: no rashes, lesions, ulcers. No induration Neurologic: Neurological:     Mental Status: Alert.     GCS: GCS eye subscore is 4. GCS verbal subscore is 5. GCS motor subscore is 6.     Comments: Mental Status:  Alert, oriented, thought content appropriate, able to give limited coherent history. Speech speech slightly slowed, but understandable. Able to follow 2 step commands without difficulty.  Cranial Nerves:  II:  Peripheral visual fields grossly normal, pupils equal, round, reactive to light III,IV, VI: ptosis not present, extra-ocular motions intact bilaterally  V,VII: smile assymmetric, with obvious flattening of the right nasolabial fold facial light touch sensation equal VIII: hearing grossly normal to voice  X: uvula appears slightly deviated to left. XI: bilateral shoulder shrug symmetric and strong XII: midline tongue extension without fassiculations Motor:  Normal tone.  4/5 strength right lower extremity, 4/5 strength left lower  extremity, 4+ /5 strength  right upper extremity with mild reduced grip strength on the right, 5/5 strength left upper extremity.  Sensory: Sensation intact to light touch in all extremities.  .  CV: distal pulses palpable throughout  Psychiatric: Normal judgment and insight. Alert and oriented x 3. Normal mood.   Labs on Admission: I have personally reviewed following labs and imaging studies  CBC: Recent Labs  Lab 10/25/19 1426 10/25/19 1441  WBC  --  4.2  NEUTROABS  --  3.0  HGB 10.9* 10.2*  HCT 32.0* 31.7*  MCV  --  92.7  PLT  --  188   Basic Metabolic Panel: Recent Labs  Lab 10/25/19 1426 10/25/19 1441  NA 143 138  K 4.4 4.4  CL 111 108  CO2  --  21*  GLUCOSE 92 92  BUN 29* 28*  CREATININE 1.80* 1.63*  CALCIUM  --  9.2   Liver Function Tests: Recent Labs  Lab 10/25/19 1441  AST 27  ALT 14  ALKPHOS 48  BILITOT 0.7  PROT 8.2*  ALBUMIN 3.9   Coagulation Profile: Recent  Labs  Lab 10/25/19 1441  INR 1.1    Radiological Exams on Admission: CT HEAD WO CONTRAST  Result Date: 10/25/2019 CLINICAL DATA:  Focal neuro deficit greater than 6 hours. Right sided weakness and facial droop since yesterday. EXAM: CT HEAD WITHOUT CONTRAST TECHNIQUE: Contiguous axial images were obtained from the base of the skull through the vertex without intravenous contrast. COMPARISON:  None. FINDINGS: Brain: Ill-defined hypodensity in the left basal ganglia consistent with infarct which could be subacute and account for the patient's symptoms. This involves the putamen and caudate. Mild atrophy. Negative for hemorrhage or mass. Small chronic infarct in the left cerebellum. Vascular: Negative for hyperdense vessel Skull: Negative Sinuses/Orbits: Paranasal sinuses clear.  Negative orbit. Other: None IMPRESSION: Hypodensity in the left basal ganglia compatible with subacute infarct involving the putamen and caudate nucleus. No intracranial hemorrhage. These results were called by telephone at  the time of interpretation on 10/25/2019 at 2:15 pm to provider Sherwood Gambler , who verbally acknowledged these results. Electronically Signed   By: Franchot Gallo M.D.   On: 10/25/2019 14:15    EKG: Independently reviewed.  Sinus rhythm, QTC 456.  Premature atrial complexes.  No significant ST or T wave abnormalities compared to prior EKG.  Assessment/Plan Active Problems:   CVA (cerebral vascular accident) ([place])  Subacute CVA-right facial droop, slurred speech, right-sided weakness.last known normal yesterday.  CT findings of subacute infarct involving the putamen and caudate nucleus.  Outside window for any therapeutic intervention. -[facility] to [facility], neurology not available at [facility] -Please reconsult neurology in the morning. -Aspirin 325x1, continue 81 mg daily -Atorvastatin 40 mg daily -MRI/MRA brain -Echocardiogram -Carotid Dopplers -PT OT speech therapy evaluation -Lipid panel, hemoglobin A1c in a.m. -Passed bedside swallow evaluation  Metabolic encephalopathy-likely due to subacute stroke.  At this time denies symptom to suggest infectious etiology.  Vitals stable.  Afebrile.  WBC 4.2. -Follow-up UA, UDS  History of metastatic prostate cancer- s/p bilateral orchiectomy 2015, currently on chemotherapy, infusions.  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 Follows with Dr. Delton Coombes.  Normocytic anemia: Hemoglobin 10.2, baseline 11-12, secondary to CKD and chemotherapy.  Hemoglobin ranging between 11 and 12.    CKD 3-creatinine 1.6, baseline 1.5-1.6.  DVT prophylaxis: Lovenox Code Status: Full code Family Communication: None at bedside Disposition Plan: 1 to 2 days Consults called: Please reconsult neurology in the morning. Admission status: Inpatient, telemetry. I certify that at the point of admission it is my clinical judgment that the patient will require inpatient hospital care spanning beyond 2 midnights from the point of admission due to high intensity of service, high  risk for further deterioration and high frequency of surveillance required. The following factors support the patient status of inpatient: CVA evidenced on CT, stroke work-up.   Bethena Roys MD [facility]  10/25/2019, 7:46 PM

## 2019-10-25 NOTE — ED Triage Notes (Signed)
EMS called out for possible stroke. Right sided weakness and facial droop that started yesterday. Pt denies any weakness currently. Per EMS, pt was confused upon arrival. Pt does not have any complaints. Seen by Dr. Regenia Skeeter and Tele cart. Ruled out code stroke.

## 2019-10-25 NOTE — ED Provider Notes (Signed)
[facility] Provider Note   CSN: [id number] Arrival date & time: 10/25/19  1300  LEVEL 5 CAVEAT - ALTERED MENTAL STATUS  History Chief Complaint  Patient presents with  . Weakness    Jeff [name] is a 74 y.o. male.  HPI 74 year old male presents with weakness and altered mental status.  History mostly from EMS.  Friend called because the patient seemed to have weakness and trouble speaking.  Last known normal was at least yesterday.  The patient is not a good historian as to what is going on.  He is disoriented.  He complains of no weakness but does acknowledge his speech is different.   Past Medical History:  Diagnosis Date  . Acute renal failure ([place]) 01/26/2014  . Alcohol abuse   . Anemia of chronic disease 01/26/2014  . Bilateral hydronephrosis 01/26/2014  . History of cocaine abuse ([place]) 01/26/2014  . Homelessness 01/31/2014  . Prostate cancer metastatic to multiple sites ([place]) 01/30/2014  . Sepsis ([place])   . UTI (lower urinary tract infection)     Patient Active Problem List   Diagnosis Date Noted  . Port or reservoir infection   . B12 deficiency 04/01/2016  . Malnutrition of moderate degree ([place]) 05/24/2014  . Bacteremia 05/24/2014  . Pyrexia   . Sepsis ([place]) 05/23/2014  . UTI (lower urinary tract infection) 05/23/2014  . Health care maintenance 02/06/2014  . Homelessness 01/31/2014  . Malignant neoplasm of prostate ([place]) 01/30/2014  . Bone lesion 01/26/2014  . Acute renal failure ([place]) 01/26/2014  . Bilateral hydronephrosis 01/26/2014  . Anemia of chronic disease 01/26/2014  . History of cocaine abuse ([place]) 01/26/2014    Past Surgical History:  Procedure Laterality Date  . ORCHIECTOMY Bilateral 02/05/2014   Procedure: BILATERAL ORCHIECTOMY;  Surgeon: Festus Aloe, MD;  Location[facility];  Service: Urology;  Laterality: Bilateral;  . PERCUTANEOUS NEPHROSTOMY Bilateral    IR Dr. Junious Silk changed on 04/22/2014  . PORT-A-CATH REMOVAL  Right 08/08/2017   Procedure: REMOVAL PORT-A-CATH RIGHT CHEST (PROCEDURE #2);  Surgeon: Aviva Signs, MD;  Location: [facility];  Service: General;  Laterality: Right;  . PORTACATH PLACEMENT Right 03/12/14  . PORTACATH PLACEMENT Left 08/08/2017   Procedure: INSERTION POWER PORT WITH ATTACHED CATHETER LEFT SUBCLAVIAN (PROCEDURE #1);  Surgeon: Aviva Signs, MD;  Location: [facility];  Service: General;  Laterality: Left;       Family History  Problem Relation Age of Onset  . Cancer Brother 33    Social History   Tobacco Use  . Smoking status: Current Every Day Smoker    Packs/day: 0.25    Years: 55.00    Pack years: 13.75    Types: Cigarettes  . Smokeless tobacco: Never Used  Vaping Use  . Vaping Use: Never used  Substance Use Topics  . Alcohol use: Yes    Alcohol/week: 1.0 standard drink    Types: 1 Cans of beer per week  . Drug use: Yes    Types: Cocaine    Comment: last used Cocaine 01/25/14    Home Medications Prior to Admission medications   Medication Sig Start Date End Date Taking? Authorizing Provider  calcium gluconate 500 MG tablet Take 1 tablet by mouth 2 (two) times daily.    [provider]  prochlorperazine (COMPAZINE) 10 MG tablet The day after chemo take 1 tab four times a day x 2 days. Then may take 1 tab four times a day if needed for nausea/vomiting. 04/24/14   Baird Cancer,  PA-C    Allergies    Patient has no known allergies.  Review of Systems   Review of Systems  Unable to perform ROS: Mental status change    Physical Exam Updated Vital Signs BP 92/75 (BP Location: Right Arm)   Pulse 86   Temp 98.5 F (36.9 C) (Oral)   Resp 18   Ht 6' (1.829 m)   Wt 76.2 kg   SpO2 98%   BMI 22.78 kg/m   Physical Exam Vitals and nursing note reviewed.  Constitutional:      Appearance: He is well-developed.  HENT:     Head: Normocephalic and atraumatic.     Right Ear: External ear normal.     Left Ear: External ear normal.     Nose: Nose  normal.  Eyes:     General:        Right eye: No discharge.        Left eye: No discharge.  Cardiovascular:     Rate and Rhythm: Normal rate and regular rhythm.     Heart sounds: Normal heart sounds.  Pulmonary:     Effort: Pulmonary effort is normal.     Breath sounds: Normal breath sounds.  Abdominal:     Palpations: Abdomen is soft.     Tenderness: There is no abdominal tenderness.  Musculoskeletal:     Cervical back: Neck supple.  Skin:    General: Skin is warm and dry.  Neurological:     Mental Status: He is alert. He is disoriented.     Comments: Right facial droop with slurred speech.  5/5 strength in all 4 extremities. Grossly normal sensation. Normal finger to nose.   Psychiatric:        Mood and Affect: Mood is not anxious.     ED Results / Procedures / Treatments   Labs (all labs ordered are listed, but only abnormal results are displayed) Labs Reviewed  CBC - Abnormal; Notable for the following components:      Result Value   RBC 3.42 (*)    Hemoglobin 10.2 (*)    HCT 31.7 (*)    RDW 17.2 (*)    All other components within normal limits  I-STAT CHEM 8, ED - Abnormal; Notable for the following components:   BUN 29 (*)    Creatinine, Ser 1.80 (*)    TCO2 21 (*)    Hemoglobin 10.9 (*)    HCT 32.0 (*)    All other components within normal limits  URINE CULTURE  DIFFERENTIAL  ETHANOL  PROTIME-INR  APTT  COMPREHENSIVE METABOLIC PANEL  RAPID URINE DRUG SCREEN, HOSP PERFORMED  URINALYSIS, ROUTINE W REFLEX MICROSCOPIC  RAPID URINE DRUG SCREEN, HOSP PERFORMED    EKG None  Radiology CT HEAD WO CONTRAST  Result Date: 10/25/2019 CLINICAL DATA:  Focal neuro deficit greater than 6 hours. Right sided weakness and facial droop since yesterday. EXAM: CT HEAD WITHOUT CONTRAST TECHNIQUE: Contiguous axial images were obtained from the base of the skull through the vertex without intravenous contrast. COMPARISON:  None. FINDINGS: Brain: Ill-defined hypodensity in the  left basal ganglia consistent with infarct which could be subacute and account for the patient's symptoms. This involves the putamen and caudate. Mild atrophy. Negative for hemorrhage or mass. Small chronic infarct in the left cerebellum. Vascular: Negative for hyperdense vessel Skull: Negative Sinuses/Orbits: Paranasal sinuses clear.  Negative orbit. Other: None IMPRESSION: Hypodensity in the left basal ganglia compatible with subacute infarct involving the putamen and caudate nucleus.  No intracranial hemorrhage. These results were called by telephone at the time of interpretation on 10/25/2019 at 2:15 pm to provider Sherwood Gambler , who verbally acknowledged these results. Electronically Signed   By: Franchot Gallo M.D.   On: 10/25/2019 14:15    Procedures Procedures (including critical care time)  Medications Ordered in ED Medications  sodium chloride 0.9 % bolus 1,000 mL (has no administration in time range)  sodium chloride 0.9 % bolus 500 mL (500 mLs Intravenous New Bag/Given 10/25/19 1409)    ED Course  I have reviewed the triage vital signs and the nursing notes.  Pertinent labs & imaging results that were available during my care of the patient were reviewed by me and considered in my medical decision making (see chart for details).  Clinical Course as of Oct 24 1517  Thu Oct 25, 2019  1337 Patient is confused which limits the history.  EMS reports last known well was yesterday and patient indicates that his speech changed yesterday but his history does not seem to be terribly reliable.  No obvious large vessel occlusion symptoms and thus he is not a code stroke but will work-up for stroke.   [SG]    Clinical Course User Index [SG] Sherwood Gambler, MD   MDM Rules/Calculators/A&P                          Patient CT shows subacute stroke.  This goes along with his likely onset of yesterday or before.  No indication for TPA.  I discussed with Dr. Cheral Marker of neurology, given there is no  neurologist here at this time (on vacation).  He recommends hospitalist admission and this should otherwise be straightforward, if hospitalist prefers to send to come for neurology evaluation then that is fine, otherwise could do a typical stroke work-up at [facility].  Discussed with Dr. Denton Brick for admission Final Clinical Impression(s) / ED Diagnoses Final diagnoses:  Acute ischemic stroke [facility])    Rx / DC Orders ED Discharge Orders    None       Sherwood Gambler, MD 10/25/19 1536

## 2019-10-25 NOTE — ED Notes (Signed)
Advised patient we needed urine specimen. 

## 2019-10-26 ENCOUNTER — Inpatient Hospital Stay (HOSPITAL_COMMUNITY): Payer: Medicare Other

## 2019-10-26 DIAGNOSIS — F1410 Cocaine abuse, uncomplicated: Secondary | ICD-10-CM

## 2019-10-26 DIAGNOSIS — G9341 Metabolic encephalopathy: Secondary | ICD-10-CM

## 2019-10-26 DIAGNOSIS — N183 Chronic kidney disease, stage 3 unspecified: Secondary | ICD-10-CM

## 2019-10-26 DIAGNOSIS — I6389 Other cerebral infarction: Secondary | ICD-10-CM

## 2019-10-26 DIAGNOSIS — I639 Cerebral infarction, unspecified: Secondary | ICD-10-CM

## 2019-10-26 DIAGNOSIS — C61 Malignant neoplasm of prostate: Secondary | ICD-10-CM

## 2019-10-26 LAB — VITAMIN B12: Vitamin B-12: 1281 pg/mL — ABNORMAL HIGH (ref 180–914)

## 2019-10-26 LAB — ECHOCARDIOGRAM COMPLETE
Height: 72 in
Weight: 2688 oz

## 2019-10-26 LAB — LIPID PANEL
Cholesterol: 192 mg/dL (ref 0–200)
HDL: 42 mg/dL (ref >40)
LDL Cholesterol: 134 mg/dL — ABNORMAL HIGH (ref 0–99)
Total CHOL/HDL Ratio: 4.6 RATIO
Triglycerides: 81 mg/dL (ref <150)
VLDL: 16 mg/dL (ref 0–40)

## 2019-10-26 LAB — URINALYSIS, ROUTINE W REFLEX MICROSCOPIC
Bilirubin Urine: NEGATIVE
Glucose, UA: NEGATIVE mg/dL
Hgb urine dipstick: NEGATIVE
Ketones, ur: 5 mg/dL — AB
Leukocytes,Ua: NEGATIVE
Nitrite: NEGATIVE
Protein, ur: NEGATIVE mg/dL
Specific Gravity, Urine: 1.013 (ref 1.005–1.030)
pH: 5 (ref 5.0–8.0)

## 2019-10-26 LAB — T4, FREE: Free T4: 1.11 ng/dL (ref 0.61–1.12)

## 2019-10-26 LAB — RAPID URINE DRUG SCREEN, HOSP PERFORMED
Amphetamines: NOT DETECTED
Barbiturates: NOT DETECTED
Benzodiazepines: NOT DETECTED
Cocaine: POSITIVE — AB
Opiates: NOT DETECTED
Tetrahydrocannabinol: NOT DETECTED

## 2019-10-26 LAB — AMMONIA: Ammonia: 18 umol/L (ref 9–35)

## 2019-10-26 LAB — TSH: TSH: 1.083 u[IU]/mL (ref 0.350–4.500)

## 2019-10-26 LAB — SARS CORONAVIRUS 2 BY RT PCR (HOSPITAL ORDER, PERFORMED IN ~~LOC~~ HOSPITAL LAB): SARS Coronavirus 2: NEGATIVE

## 2019-10-26 LAB — HEMOGLOBIN A1C
Hgb A1c MFr Bld: 6.3 % — ABNORMAL HIGH (ref 4.8–5.6)
Mean Plasma Glucose: 134.11 mg/dL

## 2019-10-26 LAB — FOLATE: Folate: 7.9 ng/mL (ref >5.9)

## 2019-10-26 MED ORDER — ENOXAPARIN SODIUM 40 MG/0.4ML ~~LOC~~ SOLN
40.0000 mg | Freq: Every day | SUBCUTANEOUS | Status: DC
  Administered 2019-10-26 – 2019-11-02 (×9): 40 mg via SUBCUTANEOUS
  Filled 2019-10-26 (×9): qty 0.4

## 2019-10-26 MED ORDER — SENNOSIDES-DOCUSATE SODIUM 8.6-50 MG PO TABS: 1.0000 | ORAL_TABLET | Freq: Every evening | ORAL | Status: DC | PRN

## 2019-10-26 MED ORDER — ACETAMINOPHEN 325 MG PO TABS: 650.0000 mg | ORAL_TABLET | ORAL | Status: DC | PRN

## 2019-10-26 MED ORDER — SODIUM CHLORIDE 0.9% FLUSH: 10.0000 mL | INTRAVENOUS | Status: DC | PRN

## 2019-10-26 MED ORDER — LACTATED RINGERS IV SOLN: INTRAVENOUS | Status: AC

## 2019-10-26 MED ORDER — ACETAMINOPHEN 160 MG/5ML PO SOLN: 650.0000 mg | ORAL | Status: DC | PRN

## 2019-10-26 MED ORDER — CHLORHEXIDINE GLUCONATE CLOTH 2 % EX PADS
6.0000 | MEDICATED_PAD | Freq: Every day | CUTANEOUS | Status: DC
  Administered 2019-10-27 – 2019-11-03 (×8): 6 via TOPICAL

## 2019-10-26 MED ORDER — ACETAMINOPHEN 650 MG RE SUPP: 650.0000 mg | RECTAL | Status: DC | PRN

## 2019-10-26 MED ORDER — SODIUM CHLORIDE 0.9% FLUSH
10.0000 mL | Freq: Two times a day (BID) | INTRAVENOUS | Status: DC
  Administered 2019-10-26 – 2019-11-03 (×15): 10 mL

## 2019-10-26 MED ORDER — STROKE: EARLY STAGES OF RECOVERY BOOK
Freq: Once | Status: DC
  Filled 2019-10-26: qty 1

## 2019-10-26 NOTE — ED Notes (Signed)
Attempted to call report to [facility], nurse will return call.

## 2019-10-26 NOTE — Progress Notes (Addendum)
PROGRESS NOTE  Jeff [name] YKD:[id number] DOB: 07/21/1945 DOA: 10/25/2019 PCP: Holley Bouche, NP (Inactive)  Brief History:  74 year old male with a history of prostate cancer, cocaine use, bilateral hydronephrosis, B12 deficiency, CKD stage III presenting with slurred speech and facial droop.  The patient is a poor historian.  Apparently he had a friend that noticed that he was having speech difficulty and facial droop and activated EMS.  Apparently this started on 10/24/2019.  The patient is unable to tell me any specific details regarding his facial droop or slurred speech other than the fact that he has noticed some speech difficulty.  The patient denies any headache, visual disturbance, focal extremity weakness, fevers, chills, chest pain, shortness breath, coughing, hemoptysis, nausea, vomiting, diarrhea, abdominal pain, dysuria.  He states that he has used cocaine within the past week.  He is unable to provide any significant details particularly with regard to timing of his neurologic deficits.  He is unable to tell me how long he has been using cocaine how often.  He smokes 1/2 pack/day.  In addition, the patient states that he drinks alcohol regularly although is unable to tell me the amount or frequency or last use of alcohol.  In the emergency department, the patient is awake and alert and follows commands.  He has been afebrile and hemodynamically stable albeit with soft blood pressures.  Oxygen saturation is 100% on room air.  CT of the brain showed a hypodensity in the left basal ganglia consistent with subacute infarct in the putamen and caudate nucleus.  BMP showed a serum creatinine of one-point 6 repeat LFTs were unremarkable.  WBC 4.2, hemoglobin 10.2, platelets 201,000.  The patient was admitted for further evaluation and treatment of his possible stroke.  EDP contacted Dr. Cheral Marker  Assessment/Plan: Ischemic CVA -Neurology Consult--please contact after arrival to  [facility] -PT/OT evaluation -Speech therapy eval -CT brain--hypodensity left basal ganglia consistent with subacute infarct in the putamen and caudate nucleus -MRI brain-- -MRA brain-- -Carotid Duplex-- -Echo-- -LDL-- -HbA1C-- -Antiplatelet--aspirin 81 mg daily  CKD stage IIIb -Baseline creatinine 3.9-7.6  Metabolic encephalopathy -Unclear baseline -Check TSH -Serum -B12  -Ammonia -UA--no pyuria -remains slow in responses  Metastatic prostate cancer -Patient follows Dr. Delton Coombes -Last chemotherapy 10/18/2019  Polysubstance abuse -Including tobacco, cocaine, alcohol -Alcohol withdrawal protocol  Hyperlipidemia -started lipitor       Status is: Inpatient  Remains inpatient appropriate because:Ongoing diagnostic testing needed not appropriate for outpatient work up   Dispo: The patient is from: Home              Anticipated d/c is to: Home              Anticipated d/c date is: 2 days              Patient currently is not medically stable to d/c.        Family Communication:  no Family at bedside  Consultants: neurology   Code Status:  FULL / DNR  DVT Prophylaxis:  [place] Heparin / [place] Lovenox   Procedures: As Listed in Progress Note Above  Antibiotics: None       Subjective: Patient denies fevers, chills, headache, chest pain, dyspnea, nausea, vomiting, diarrhea, abdominal pain, dysuria, hematuria, hematochezia, and melena.   Objective: Vitals:   10/26/19 0500 10/26/19 0530 10/26/19 0600 10/26/19 0630  BP: 93/67 99/63 106/70 93/62  Pulse: (!) 57 (!) 56 (!) 56 (!) 55  Resp:  13 (!) 27 14 11   Temp:      TempSrc:      SpO2: 100% 99% 98% 100%  Weight:      Height:        Intake/Output Summary (Last 24 hours) at 10/26/2019 0655 Last data filed at 10/25/2019 1635 Gross per 24 hour  Intake 1200 ml  Output --  Net 1200 ml   Weight change:  Exam:   General:  Pt is alert, follows commands appropriately, not in acute distress  HEENT: No  icterus, No thrush, No neck mass, [place]/AT  Cardiovascular: RRR, S1/S2, no rubs, no gallops  Respiratory: CTA bilaterally, no wheezing, no crackles, no rhonchi  Abdomen: Soft/+BS, non tender, non distended, no guarding  Extremities: No edema, No lymphangitis, No petechiae, No rashes, no synovitis  Neuro:  CN II-XII intact, strength 4-/5 in RUE, RLE, strength 4+/5 LUE, LLE; sensation intact bilateral; no dysmetria; babinski equivocal     Data Reviewed: I have personally reviewed following labs and imaging studies Basic Metabolic Panel: Recent Labs  Lab 10/25/19 1426 10/25/19 1441  NA 143 138  K 4.4 4.4  CL 111 108  CO2  --  21*  GLUCOSE 92 92  BUN 29* 28*  CREATININE 1.80* 1.63*  CALCIUM  --  9.2   Liver Function Tests: Recent Labs  Lab 10/25/19 1441  AST 27  ALT 14  ALKPHOS 48  BILITOT 0.7  PROT 8.2*  ALBUMIN 3.9   No results for input(s): LIPASE, AMYLASE in the last 168 hours. No results for input(s): AMMONIA in the last 168 hours. Coagulation Profile: Recent Labs  Lab 10/25/19 1441  INR 1.1   CBC: Recent Labs  Lab 10/25/19 1426 10/25/19 1441  WBC  --  4.2  NEUTROABS  --  3.0  HGB 10.9* 10.2*  HCT 32.0* 31.7*  MCV  --  92.7  PLT  --  281   Cardiac Enzymes: No results for input(s): CKTOTAL, CKMB, CKMBINDEX, TROPONINI in the last 168 hours. BNP: Invalid input(s): POCBNP CBG: No results for input(s): GLUCAP in the last 168 hours. HbA1C: No results for input(s): HGBA1C in the last 72 hours. Urine analysis:    Component Value Date/Time   COLORURINE YELLOW 10/26/2019 [place] 10/26/2019 0044   LABSPEC 1.013 10/26/2019 0044   PHURINE 5.0 10/26/2019 0044   GLUCOSEU NEGATIVE 10/26/2019 0044   HGBUR NEGATIVE 10/26/2019 0044   BILIRUBINUR NEGATIVE 10/26/2019 0044   KETONESUR 5 (A) 10/26/2019 0044   PROTEINUR NEGATIVE 10/26/2019 0044   UROBILINOGEN 1.0 05/23/2014 1425   NITRITE NEGATIVE 10/26/2019 0044   LEUKOCYTESUR NEGATIVE  10/26/2019 0044   Sepsis Labs: @LABRCNTIP (procalcitonin:4,lacticidven:4) ) Recent Results (from the past 240 hour(s))  SARS Coronavirus 2 by RT PCR (hospital order, performed in [facility] hospital lab) Nasopharyngeal Nasopharyngeal Swab     Status: None   Collection Time: 10/26/19 12:44 AM   Specimen: Nasopharyngeal Swab  Result Value Ref Range Status   SARS Coronavirus 2 NEGATIVE NEGATIVE Final    Comment: (NOTE) SARS-CoV-2 target nucleic acids are NOT DETECTED.  The SARS-CoV-2 RNA is generally detectable in upper and lower respiratory specimens during the acute phase of infection. The lowest concentration of SARS-CoV-2 viral copies this assay can detect is 250 copies / mL. A negative result does not preclude SARS-CoV-2 infection and should not be used as the sole basis for treatment or other patient management decisions.  A negative result may occur with improper specimen collection / handling, submission of  specimen other than nasopharyngeal swab, presence of viral mutation(s) within the areas targeted by this assay, and inadequate number of viral copies (<250 copies / mL). A negative result must be combined with clinical observations, patient history, and epidemiological information.  Fact Sheet for Patients:   StrictlyIdeas.no  Fact Sheet for Healthcare Providers: BankingDealers.co.za  This test is not yet approved or  cleared by the [place] FDA and has been authorized for detection and/or diagnosis of SARS-CoV-2 by FDA under an Emergency Use Authorization (EUA).  This EUA will remain in effect (meaning this test can be used) for the duration of the COVID-19 declaration under Section 564(b)(1) of the Act, 21 U.S.C. section 360bbb-3(b)(1), unless the authorization is terminated or revoked sooner.  Performed at [facility], [address] [place], [place] [address]      Scheduled Meds: .  stroke: mapping our early  stages of recovery book   Does not apply Once  . aspirin EC  81 mg Oral Daily  . atorvastatin  40 mg Oral Daily  . enoxaparin (LOVENOX) injection  40 mg Subcutaneous QHS   Continuous Infusions:  Procedures/Studies: CT HEAD WO CONTRAST  Result Date: 10/25/2019 CLINICAL DATA:  Focal neuro deficit greater than 6 hours. Right sided weakness and facial droop since yesterday. EXAM: CT HEAD WITHOUT CONTRAST TECHNIQUE: Contiguous axial images were obtained from the base of the skull through the vertex without intravenous contrast. COMPARISON:  None. FINDINGS: Brain: Ill-defined hypodensity in the left basal ganglia consistent with infarct which could be subacute and account for the patient's symptoms. This involves the putamen and caudate. Mild atrophy. Negative for hemorrhage or mass. Small chronic infarct in the left cerebellum. Vascular: Negative for hyperdense vessel Skull: Negative Sinuses/Orbits: Paranasal sinuses clear.  Negative orbit. Other: None IMPRESSION: Hypodensity in the left basal ganglia compatible with subacute infarct involving the putamen and caudate nucleus. No intracranial hemorrhage. These results were called by telephone at the time of interpretation on 10/25/2019 at 2:15 pm to provider Sherwood Gambler , who verbally acknowledged these results. Electronically Signed   By: Franchot Gallo M.D.   On: 10/25/2019 14:15    Orson Eva, DO  [facility]  If 7PM-7AM, please contact night-coverage [email or web address] Password [email or web address] 10/26/2019, 6:55 AM   LOS: 1 day

## 2019-10-26 NOTE — Progress Notes (Addendum)
Jeff [name] is a 74 y.o. male patient admitted from ED awake, alert - oriented  X 3 - no acute distress noted.  VSS - 114/74    IV was not in in place on arrival to the unit place. Left facial droop noted on arrival to the unit.  Orientation to room, and floor completed.  Patient declined safety video at this time.  Admission INP armband ID verified with patient in place.   SR up x 2, fall assessment complete, with patient able to verbalize understanding of risk associated with falls, and verbalized understanding to call nsg before up out of bed.  Call light within reach, patient able to voice, and demonstrate understanding.  Skin, clean-dry- intact without evidence of bruising, or skin tears.   No evidence of skin break down noted on exam. Safety observation in place, will continue to monitor and treat per MD orders.     Will cont to eval and treat per MD orders.  Dorris Carnes, RN 10/26/2019 7:32 PM

## 2019-10-26 NOTE — Plan of Care (Signed)
  Problem: Coping: °Goal: Will verbalize positive feelings about self °Outcome: Progressing °  °Problem: Ischemic Stroke/TIA Tissue Perfusion: °Goal: Complications of ischemic stroke/TIA will be minimized °Outcome: Progressing °  °

## 2019-10-26 NOTE — Consult Note (Addendum)
NEURO HOSPITALIST  CONSULT   Requesting Physician: Dr.Tat    Chief Complaint:  Slurred speech, facial droop and AMS  History obtained from:  Patient and Chart review  HPI:                                                                                                                                         Jeff [name] is an 74 y.o. male with a PMHx of cocaine abuse, prostate cancer and ETOH abuse who presented to [facility] on 10/25/19 with a 1 day history of slurred speech, facial droop, and AMS. Transferred to [facility] for management of stroke.  Patient cannot recall what brought him into the hospital or verbalize when the symptoms began. Per chart, right sided weakness and facial droop began on 10/24/19. A friend felt that he had weakness and facial droop and called EMS. Denies any vision changes or weakness. Was not on ASA prior to arrival.   Hospital course:  10/25/19: presented to . [facility]; hypodensity in the left BG compatible with subacute infarct.   10/26/19: MRI: Acute infarction affecting the left caudate and putamen MRA: Occluded MCA M2 branch on the left  ECHO: Left Ventricle: Left ventricular ejection fraction, by estimation, is  35%. The left ventricle has moderately decreased function. The left  ventricle demonstrates regional wall motion abnormalities. The left  ventricular internal cavity size was normal in  size. There is no left ventricular hypertrophy. Left ventricular diastolic  parameters are indeterminate. No cardiac source of embolism was identified.   US carotid: Color duplex indicates minimal heterogeneous plaque, with no hemodynamically significant stenosis by duplex criteria in the extracranial cerebrovascular circulation.  LDL: 134 Hgba1c: 6.3 UDS; positive for cocaine tPA Given: no; outside of window Modified Rankin: Rankin Score=1 NIHSS: 6 (dysarthria, drift, droop, questions)   Past Medical History:   Diagnosis Date  . Acute renal failure ([place]) 01/26/2014  . Alcohol abuse   . Anemia of chronic disease 01/26/2014  . Bilateral hydronephrosis 01/26/2014  . History of cocaine abuse ([place]) 01/26/2014  . Homelessness 01/31/2014  . Prostate cancer metastatic to multiple sites ([place]) 01/30/2014  . Sepsis ([place])   . UTI (lower urinary tract infection)     Past Surgical History:  Procedure Laterality Date  . ORCHIECTOMY Bilateral 02/05/2014   Procedure: BILATERAL ORCHIECTOMY;  Surgeon: Festus Aloe, MD;  Location: [facility] ORS;  Service: Urology;  Laterality: Bilateral;  . PERCUTANEOUS NEPHROSTOMY Bilateral    IR Dr. Junious Silk changed on 04/22/2014  . PORT-A-CATH REMOVAL Right 08/08/2017   Procedure: REMOVAL PORT-A-CATH RIGHT CHEST (PROCEDURE #2);  Surgeon: Aviva Signs, MD;  Location: [facility]  ORS;  Service: General;  Laterality: Right;  . PORTACATH PLACEMENT Right 03/12/14  . PORTACATH PLACEMENT Left 08/08/2017   Procedure: INSERTION POWER PORT WITH ATTACHED CATHETER LEFT SUBCLAVIAN (PROCEDURE #1);  Surgeon: Aviva Signs, MD;  Location: [facility] ORS;  Service: General;  Laterality: Left;    Family History  Problem Relation Age of Onset  . Cancer Brother 35   Social History:  reports that he has been smoking cigarettes. He has a 13.75 pack-year smoking history. He has never used smokeless tobacco. He reports current alcohol use of about 1.0 standard drink of alcohol per week. He reports current drug use. Drug: Cocaine.  Allergies: No Known Allergies  Medications:                                                                                                                           Scheduled: .  stroke: mapping our early stages of recovery book   Does not apply Once  . aspirin EC  81 mg Oral Daily  . atorvastatin  40 mg Oral Daily  . Chlorhexidine Gluconate Cloth  6 each Topical Daily  . enoxaparin (LOVENOX) injection  40 mg Subcutaneous QHS   Continuous: . lactated ringers 75 mL/hr at 10/26/19 0844    HDQ:[id number] **OR** acetaminophen (TYLENOL) oral liquid 160 mg/5 mL **OR** acetaminophen, senna-docusate   ROS:                                                                                                                                       ROS was performed and is negative except as noted in the HPI   General Examination:                                                                                                      Blood pressure 103/62, pulse (!) 56, temperature 97.6 F (36.4 C), temperature source Oral, resp. rate 13, height 6' (1.829 m), weight 76.2 kg, SpO2 99 %.  Physical Exam  Eyes: Normal external eye and conjunctiva. HENT: Normocephalic, no lesions, without obvious abnormality.   Musculoskeletal-no joint tenderness, deformity or swelling Cardiovascular: Normal rate and regular rhythm.  Respiratory: Effort normal, non-labored breathing saturations WNL GI: Soft.  No distension. There is no tenderness.  Skin: WDI  Neurological Examination Mental Status: Alert, oriented name and age, did not know month or year. [place].  Speech fluent without evidence of aphasia.  Able to follow simple commands without difficulty. Cranial Nerves: II:  Visual fields grossly normal. PERRL III,IV, VI: ptosis not present, EOMI V,VII: Smile asymmetric, with right facial droop. Facial light touch sensation normal bilaterally VIII: hearing intact to voice IX,X: No hypophonia XII: midline tongue extension Motor: Right : Upper extremity   4-/5  Left:     Upper extremity   5/5  Lower extremity   4-/5   Lower extremity   5/5 Normal tone throughout; no atrophy noted Sensory:  Light touch intact throughout, bilaterally Deep Tendon Reflexes: 2+ and symmetric biceps. Unable to elicit patellar reflexes Plantars:  Right: downgoing   Left: downgoing Cerebellar: No ataxia with FNF Gait: Deferred   Lab Results: Basic Metabolic Panel: Recent Labs  Lab 10/25/19 1426 10/25/19 1441  NA  143 138  K 4.4 4.4  CL 111 108  CO2  --  21*  GLUCOSE 92 92  BUN 29* 28*  CREATININE 1.80* 1.63*  CALCIUM  --  9.2    CBC: Recent Labs  Lab 10/25/19 1426 10/25/19 1441  WBC  --  4.2  NEUTROABS  --  3.0  HGB 10.9* 10.2*  HCT 32.0* 31.7*  MCV  --  92.7  PLT  --  281    Lipid Panel: Recent Labs  Lab 10/26/19 0418  CHOL 192  TRIG 81  HDL 42  CHOLHDL 4.6  VLDL 16  LDLCALC 134*    CBG: No results for input(s): GLUCAP in the last 168 hours.  Imaging: CT HEAD WO CONTRAST  Result Date: 10/25/2019 CLINICAL DATA:  Focal neuro deficit greater than 6 hours. Right sided weakness and facial droop since yesterday. EXAM: CT HEAD WITHOUT CONTRAST TECHNIQUE: Contiguous axial images were obtained from the base of the skull through the vertex without intravenous contrast. COMPARISON:  None. FINDINGS: Brain: Ill-defined hypodensity in the left basal ganglia consistent with infarct which could be subacute and account for the patient's symptoms. This involves the putamen and caudate. Mild atrophy. Negative for hemorrhage or mass. Small chronic infarct in the left cerebellum. Vascular: Negative for hyperdense vessel Skull: Negative Sinuses/Orbits: Paranasal sinuses clear.  Negative orbit. Other: None IMPRESSION: Hypodensity in the left basal ganglia compatible with subacute infarct involving the putamen and caudate nucleus. No intracranial hemorrhage. These results were called by telephone at the time of interpretation on 10/25/2019 at 2:15 pm to provider Sherwood Gambler , who verbally acknowledged these results. Electronically Signed   By: Franchot Gallo M.D.   On: 10/25/2019 14:15   MR ANGIO HEAD WO CONTRAST  Result Date: 10/26/2019 CLINICAL DATA:  Slurred speech. Right facial droop. Right-sided weakness. EXAM: MRI HEAD WITHOUT CONTRAST MRA HEAD WITHOUT CONTRAST TECHNIQUE: Multiplanar, multiecho pulse sequences of the brain and surrounding structures were obtained without intravenous contrast.  Angiographic images of the head were obtained using MRA technique without contrast. COMPARISON:  Head CT 10/25/2019 FINDINGS: MRI HEAD FINDINGS Brain: Diffusion imaging shows acute infarction in the right middle cerebral artery territory affecting the corpus striata arm portion of the basal ganglia, the insular region and  the frontoparietal brain in a patchy and scattered fashion. No large confluent infarction. Mild swelling in the regions of infarction but no mass effect. No discernible hemorrhagic transformation. Elsewhere, the brainstem is normal. There are old small vessel infarctions within the cerebellum on the left. The cerebral hemispheres do not show a pattern of widespread small-vessel disease. No mass lesion, hydrocephalus or extra-axial collection. Vascular: Major vessels at the base of the brain show flow. Skull and upper cervical spine: Negative Sinuses/Orbits: Clear/normal Other: None MRA HEAD FINDINGS Both internal carotid arteries are widely patent through the skull base and siphon regions. The right middle cerebral artery and both anterior cerebral arteries show normal flow. The left middle cerebral artery is patent to the bifurcation. The inferior division shows flow but the superior division is occluded. Both vertebral arteries are patent through the foramen magnum with the right being dominant. The left vertebral artery supplies PICA, with a tiny contribution to the basilar. Dominant right vertebral artery widely patent to the basilar. No basilar stenosis. Superior cerebellar and posterior cerebral arteries show flow. IMPRESSION: Acute infarction affecting the left caudate and putamen and with patchy areas of involvement affecting the insular region and frontoparietal cortical and subcortical brain. Mild swelling but no hemorrhage. 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 MR angiography shows occlusion of the superior division MCA M2 branch on the left. Electronically Signed   By:  Nelson Chimes M.D.   On: 10/26/2019 15:26   MR BRAIN WO CONTRAST  Result Date: 10/26/2019 CLINICAL DATA:  Slurred speech. Right facial droop. Right-sided weakness. EXAM: MRI HEAD WITHOUT CONTRAST MRA HEAD WITHOUT CONTRAST TECHNIQUE: Multiplanar, multiecho pulse sequences of the brain and surrounding structures were obtained without intravenous contrast. Angiographic images of the head were obtained using MRA technique without contrast. COMPARISON:  Head CT 10/25/2019 FINDINGS: MRI HEAD FINDINGS Brain: Diffusion imaging shows acute infarction in the right middle cerebral artery territory affecting the corpus striata arm portion of the basal ganglia, the insular region and the frontoparietal brain in a patchy and scattered fashion. No large confluent infarction. Mild swelling in the regions of infarction but no mass effect. No discernible hemorrhagic transformation. Elsewhere, the brainstem is normal. There are old small vessel infarctions within the cerebellum on the left. The cerebral hemispheres do not show a pattern of widespread small-vessel disease. No mass lesion, hydrocephalus or extra-axial collection. Vascular: Major vessels at the base of the brain show flow. Skull and upper cervical spine: Negative Sinuses/Orbits: Clear/normal Other: None MRA HEAD FINDINGS Both internal carotid arteries are widely patent through the skull base and siphon regions. The right middle cerebral artery and both anterior cerebral arteries show normal flow. The left middle cerebral artery is patent to the bifurcation. The inferior division shows flow but the superior division is occluded. Both vertebral arteries are patent through the foramen magnum with the right being dominant. The left vertebral artery supplies PICA, with a tiny contribution to the basilar. Dominant right vertebral artery widely patent to the basilar. No basilar stenosis. Superior cerebellar and posterior cerebral arteries show flow. IMPRESSION: Acute  infarction affecting the left caudate and putamen and with patchy areas of involvement affecting the insular region and frontoparietal cortical and subcortical brain. Mild swelling but no hemorrhage. 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 MR angiography shows occlusion of the superior division MCA M2 branch on the left. Electronically Signed   By: Nelson Chimes M.D.   On: 10/26/2019 15:26   US Carotid Bilateral (at [facility]  and [facility] only)  Result Date: 10/26/2019 CLINICAL DATA:  74 year old male with a history of stroke EXAM: BILATERAL CAROTID DUPLEX ULTRASOUND TECHNIQUE: Pearline Cables scale imaging, color Doppler and duplex ultrasound were performed of bilateral carotid and vertebral arteries in the neck. COMPARISON:  None. FINDINGS: Criteria: Quantification of carotid stenosis is based on velocity parameters that correlate the residual internal carotid diameter with NASCET-based stenosis levels, using the diameter of the distal internal carotid lumen as the denominator for stenosis measurement. The following velocity measurements were obtained: RIGHT ICA:  Systolic 81 cm/sec, Diastolic 29 cm/sec CCA:  82 cm/sec SYSTOLIC ICA/CCA RATIO:  1.0 ECA:  70 cm/sec LEFT ICA:  Systolic 78 cm/sec, Diastolic 25 cm/sec CCA:  91 cm/sec SYSTOLIC ICA/CCA RATIO:  0.9 ECA:  75 cm/sec Right Brachial SBP: Not acquired Left Brachial SBP: Not acquired RIGHT CAROTID ARTERY: No significant calcified disease of the right common carotid artery. Intermediate waveform maintained. Heterogeneous plaque without significant calcifications at the right carotid bifurcation. Low resistance waveform of the right ICA. No significant tortuosity. RIGHT VERTEBRAL ARTERY: Antegrade flow with low resistance waveform. LEFT CAROTID ARTERY: No significant calcified disease of the left common carotid artery. Intermediate waveform maintained. Heterogeneous plaque at the left carotid bifurcation without significant calcifications. Low resistance waveform of the  left ICA. LEFT VERTEBRAL ARTERY:  Antegrade flow with low resistance waveform. IMPRESSION: Color duplex indicates minimal heterogeneous plaque, with no hemodynamically significant stenosis by duplex criteria in the extracranial cerebrovascular circulation. Signed, Dulcy Fanny. Dellia Nims, RPVI [facility] Electronically Signed   By: Corrie Mckusick D.O.   On: 10/26/2019 11:10   ECHOCARDIOGRAM COMPLETE  Result Date: 10/26/2019    ECHOCARDIOGRAM REPORT   Patient Name:   Jeff [name] Date of Exam: 10/26/2019 Medical Rec #:  [id number]       Height:       72.0 in Accession #:    [id number]      Weight:       168.0 lb Date of Birth:  Aug 16, 1945       BSA:          1.978 m Patient Age:    56 years        BP:           114/74 mmHg Patient Gender: M               HR:           59 bpm. Exam Location:  Inpatient Procedure: 2D Echo                              MODIFIED REPORT: This report was modified by Cherlynn Kaiser MD on 10/26/2019 due to revision.  Indications:     Stroke 434.91 / I163.9  History:         Patient has no prior history of Echocardiogram examinations.                  Signs/Symptoms:Hypotension. Cocaine use, prostate cancer,                  bilateral hydronephrosis metastatic prostate cancer, Metabolic                  encephalopathy, Normocytic anemia, chronic kidney disease.  Sonographer:     Darlina Sicilian RDCS Referring Phys:  [id number] [place] Diagnosing Phys: Cherlynn Kaiser MD  Sonographer Comments: No IV access, unable to perform  Definity to entirely exclude LV thrombus. IMPRESSIONS  1. Left ventricular ejection fraction, by estimation, is 35%. The left ventricle has moderately decreased function. The left ventricle demonstrates regional wall motion abnormalities (see scoring diagram/findings for description). Left ventricular diastolic parameters are indeterminate.  2. Right ventricular systolic function is normal. The right ventricular  size is normal. Tricuspid regurgitation signal is inadequate for assessing PA pressure.  3. The mitral valve is normal in structure. Trivial mitral valve regurgitation. No evidence of mitral stenosis.  4. The aortic valve is tricuspid. Aortic valve regurgitation is mild. No aortic stenosis is present.  5. Aortic dilatation noted. There is borderline dilatation of the aortic root measuring 39 mm.  6. The inferior vena cava is normal in size with greater than 50% respiratory variability, suggesting right atrial pressure of 3 mmHg. Conclusion(s)/Recommendation(s): No intracardiac source of embolism detected on this transthoracic study. A transesophageal echocardiogram is recommended to exclude cardiac source of embolism if clinically indicated. FINDINGS  Left Ventricle: Left ventricular ejection fraction, by estimation, is 35%. The left ventricle has moderately decreased function. The left ventricle demonstrates regional wall motion abnormalities. The left ventricular internal cavity size was normal in size. There is no left ventricular hypertrophy. Left ventricular diastolic parameters are indeterminate.  LV Wall Scoring: Mild basal hypokinesis with severe hypokinesis of the apex. Right Ventricle: The right ventricular size is normal. No increase in right ventricular wall thickness. Right ventricular systolic function is normal. Tricuspid regurgitation signal is inadequate for assessing PA pressure. Left Atrium: Left atrial size was normal in size. Right Atrium: Right atrial size was normal in size. Pericardium: A small pericardial effusion is present. Mitral Valve: The mitral valve is normal in structure. Normal mobility of the mitral valve leaflets. Trivial mitral valve regurgitation. No evidence of mitral valve stenosis. Tricuspid Valve: The tricuspid valve is normal in structure. Tricuspid valve regurgitation is trivial. No evidence of tricuspid stenosis. Aortic Valve: The aortic valve is tricuspid. . There is  mild thickening of the aortic valve. Aortic valve regurgitation is mild. No aortic stenosis is present. There is mild thickening of the aortic valve. Pulmonic Valve: The pulmonic valve was grossly normal. Pulmonic valve regurgitation is trivial. No evidence of pulmonic stenosis. Aorta: Aortic dilatation noted and the ascending aorta was not well visualized. There is borderline dilatation of the aortic root measuring 39 mm. Venous: The inferior vena cava is normal in size with greater than 50% respiratory variability, suggesting right atrial pressure of 3 mmHg. IAS/Shunts: The interatrial septum was not well visualized.  LEFT VENTRICLE PLAX 2D LVIDd:         5.20 cm      Diastology LVIDs:         3.80 cm      LV e' lateral: 5.66 cm/s LV PW:         0.90 cm      LV e' medial:  4.57 cm/s LV IVS:        1.00 cm LVOT diam:     2.00 cm LV SV:         60 LV SV Index:   30 LVOT Area:     3.14 cm     3D Volume EF:                             3D EF:        37 %  LV EDV:       149 ml LV Volumes (MOD)            LV ESV:       93 ml LV vol d, MOD A2C: 123.5 ml LV SV:        56 ml LV vol d, MOD A4C: 124.0 ml LV vol s, MOD A2C: 95.9 ml LV vol s, MOD A4C: 62.6 ml LV SV MOD A2C:     27.7 ml LV SV MOD A4C:     124.0 ml LV SV MOD BP:      45.2 ml RIGHT VENTRICLE RV S prime:     12.20 cm/s TAPSE (M-mode): 1.9 cm LEFT ATRIUM             Index       RIGHT ATRIUM           Index LA diam:        3.30 cm 1.67 cm/m  RA Area:     10.30 cm LA Vol (A2C):   65.4 ml 33.06 ml/m RA Volume:   20.80 ml  10.51 ml/m LA Vol (A4C):   51.0 ml 25.78 ml/m LA Biplane Vol: 57.0 ml 28.81 ml/m  AORTIC VALVE LVOT Vmax:   108.00 cm/s LVOT Vmean:  64.500 cm/s LVOT VTI:    0.190 m  AORTA Ao Root diam: 3.90 cm  SHUNTS Systemic VTI:  0.19 m Systemic Diam: 2.00 cm Cherlynn Kaiser MD Electronically signed by Cherlynn Kaiser MD Signature Date/Time: 10/26/2019/3:58:27 PM    Final (Updated)     Laurey Morale, MSN, NP-C [facility] [phone] 10/26/2019, 6:03 PM    Assessment: 74 y.o. male a PMHx of cocaine abuse, prostate cancer and ETOH abuse who presented to [facility] on 10/25/19 with a 1 day history of slurred speech, facial droop, and AMS. Transferred to [facility] for management of stroke. -- MRI brain Acute infarction affecting the left caudate and putamen -- MRA showed a left M2 occlusion. -- ECHO: Left Ventricle: Left ventricular ejection fraction, by estimation, is  35%. The left ventricle has moderately decreased function. No cardiac source of embolism was identified.  -- Carotid ultrasound: Color duplex indicates minimal heterogeneous plaque, with no hemodynamically significant stenosis by duplex criteria in the extracranial cerebrovascular circulation -- Of note, symptoms began > 24 hours.  The patient was not a tPA or endovascular candidate.  -- LDL was 134  and Hgba1c 6.3.   -- Stroke Risk Factors - Cancer history and cocaine abuse   Recommendations: -- BP goal: Normalize BP to goal of < 140/90 by discharge. -- ASA -- Atorvastatin 40 mg daily. Obtain baseline CK level. -- PT consult, OT consult, Speech consult --Telemetry monitoring -- Frequent neuro checks -- Stroke swallow screen  -- Cocaine-cessation counseling -- Please page the Stroke team from 8am-4pm.   You can look them up on [email or web address]   I have seen and examined the patient. I have formulated the assessment and recommendations. 74 year old male presenting cocaine-positive with a left MCA stroke. Work up is complete. Most likely etiology for the stroke is vasospasm secondary to sympathomimetic abuse. Due to low EF on echocardiogram, cardioembolic etiology is also possible. Treatment recommendations include ASA and atorvastatin.  Electronically signed: Dr. Kerney Elbe

## 2019-10-26 NOTE — Progress Notes (Signed)
  Echocardiogram 2D Echocardiogram has been performed.  Jeff [name] M 10/26/2019, 1:17 PM

## 2019-10-27 LAB — BASIC METABOLIC PANEL
Anion gap: 10 (ref 5–15)
BUN: 22 mg/dL (ref 8–23)
CO2: 21 mmol/L — ABNORMAL LOW (ref 22–32)
Calcium: 9.3 mg/dL (ref 8.9–10.3)
Chloride: 110 mmol/L (ref 98–111)
Creatinine, Ser: 1.34 mg/dL — ABNORMAL HIGH (ref 0.61–1.24)
GFR calc Af Amer: >60 mL/min (ref >60)
GFR calc non Af Amer: 53 mL/min — ABNORMAL LOW (ref >60)
Glucose, Bld: 92 mg/dL (ref 70–99)
Potassium: 4 mmol/L (ref 3.5–5.1)
Sodium: 141 mmol/L (ref 135–145)

## 2019-10-27 LAB — URINE CULTURE

## 2019-10-27 NOTE — Progress Notes (Signed)
Inpatient Rehab Admissions Coordinator Note:   Per therapy recommendations, pt was screened for CIR candidacy by Kahiau Schewe, MS CCC-SLP. At this time, Pt. Appears to have functional decline and is a good candidate for CIR. Will place order for rehab consult per protocol.  Please contact me with questions.   Agnes Brightbill, MS, CCC-SLP Rehab Admissions Coordinator  [phone] (celll) [phone] (office)  

## 2019-10-27 NOTE — Progress Notes (Signed)
STROKE TEAM PROGRESS NOTE   HISTORY OF PRESENT ILLNESS (per record) Jeff [name] is an 74 y.o. male with a PMHx of cocaine abuse, prostate cancer and ETOH abuse who presented to [facility] on 10/25/19 with a 1 day history of slurred speech, facial droop, and AMS. Transferred to [facility] for management of stroke. Patient cannot recall what brought him into the hospital or verbalize when the symptoms began. Per chart, right sided weakness and facial droop began on 10/24/19. A friend felt that he had weakness and facial droop and called EMS. Denies any vision changes or weakness. Was not on ASA prior to arrival.  Hospital course:  10/25/19: presented to [place]. CTH; hypodensity in the left BG compatible with subacute infarct.  10/26/19: MRI: Acute infarction affecting the left caudate and putamen MRA: Occluded MCA M2 branch on the left  ECHO: Left Ventricle: Left ventricular ejection fraction, by estimation, is  35%. The left ventricle has moderately decreased function. The left  ventricle demonstrates regional wall motion abnormalities. The left  ventricular internal cavity size was normal in  size. There is no left ventricular hypertrophy. Left ventricular diastolic  parameters are indeterminate. No cardiac source of embolism was identified.   US carotid: Color duplex indicates minimal heterogeneous plaque, with no hemodynamically significant stenosis by duplex criteria in the extracranial cerebrovascular circulation.  LDL: 134 Hgba1c: 6.3 UDS; positive for cocaine tPA Given: no; outside of window Modified Rankin: Rankin Score=1 NIHSS: 6 (dysarthria, drift, droop, questions)   INTERVAL HISTORY No complaints.  Tele NSR.      OBJECTIVE Vitals:   10/27/19 0500 10/27/19 0600 10/27/19 0733 10/27/19 1117  BP:   106/67 105/67  Pulse: (!) 57 63 (!) 54 64  Resp: 12 17 10 14   Temp:   98.2 F (36.8 C) 98.2 F (36.8 C)  TempSrc:   Oral Oral  SpO2: 95% 97% 97% 98%  Weight:      Height:         CBC:  Recent Labs  Lab 10/25/19 1426 10/25/19 1441  WBC  --  4.2  NEUTROABS  --  3.0  HGB 10.9* 10.2*  HCT 32.0* 31.7*  MCV  --  92.7  PLT  --  833    Basic Metabolic Panel:  Recent Labs  Lab 10/25/19 1441 10/27/19 0415  NA 138 141  K 4.4 4.0  CL 108 110  CO2 21* 21*  GLUCOSE 92 92  BUN 28* 22  CREATININE 1.63* 1.34*  CALCIUM 9.2 9.3    Lipid Panel:     Component Value Date/Time   CHOL 192 10/26/2019 0418   TRIG 81 10/26/2019 0418   HDL 42 10/26/2019 0418   CHOLHDL 4.6 10/26/2019 0418   VLDL 16 10/26/2019 0418   LDLCALC 134 (H) 10/26/2019 0418   HgbA1c:  Lab Results  Component Value Date   HGBA1C 6.3 (H) 10/25/2019   Urine Drug Screen:     Component Value Date/Time   LABOPIA NONE DETECTED 10/26/2019 0045   COCAINSCRNUR POSITIVE (A) 10/26/2019 0045   LABBENZ NONE DETECTED 10/26/2019 0045   AMPHETMU NONE DETECTED 10/26/2019 0045   THCU NONE DETECTED 10/26/2019 0045   LABBARB NONE DETECTED 10/26/2019 0045    Alcohol Level     Component Value Date/Time   ETH <10 10/25/2019 1441    IMAGING  MR BRAIN WO CONTRAST MR ANGIO HEAD WO CONTRAST 10/26/2019 IMPRESSION:  Acute infarction affecting the left caudate and putamen and with patchy areas of involvement affecting the  insular region and frontoparietal cortical and subcortical brain. Mild swelling but no hemorrhage. 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 MR angiography shows occlusion of the superior division MCA M2 branch on the left.   US Carotid Bilateral (at [facility] and [facility] only) 10/26/2019 IMPRESSION: Color duplex indicates minimal heterogeneous plaque, with no hemodynamically significant stenosis by duplex criteria in the extracranial cerebrovascular circulation.   ECHOCARDIOGRAM COMPLETE 10/26/2019 IMPRESSIONS   1. Left ventricular ejection fraction, by estimation, is 35%. The left ventricle has moderately decreased function. The left ventricle demonstrates regional wall motion  abnormalities (see scoring diagram/findings for description). Left ventricular diastolic parameters are indeterminate.   2. Right ventricular systolic function is normal. The right ventricular size is normal. Tricuspid regurgitation signal is inadequate for assessing PA pressure.   3. The mitral valve is normal in structure. Trivial mitral valve regurgitation. No evidence of mitral stenosis.   4. The aortic valve is tricuspid. Aortic valve regurgitation is mild. No aortic stenosis is present.   5. Aortic dilatation noted. There is borderline dilatation of the aortic root measuring 39 mm.   6. The inferior vena cava is normal in size with greater than 50% respiratory variability, suggesting right atrial pressure of 3 mmHg. Conclusion(s)/Recommendation(s): No intracardiac source of embolism detected on this transthoracic study. A transesophageal echocardiogram is recommended to exclude cardiac source of embolism if clinically indicated.   ECG - SR rate 95 BPM. (See cardiology reading for complete details)  PHYSICAL EXAM Blood pressure 105/67, pulse 64, temperature 98.2 F (36.8 C), temperature source Oral, resp. rate 14, height 6' (1.829 m), weight 76.2 kg, SpO2 98 %.   Awake, alert, fully oriented. Prominent right facial droop. Dysarthria.' EOMI.  PERL. Right pronator drift and 4/5 RUE o/w 5/5 elsewhere. Right babinski. Sensory intact. Coord FTN intact.      ASSESSMENT/PLAN [name] is a 74 y.o. male with history of cocaine abuse, prostate cancer, CKD, chronic anemia, tobacco use,  and ETOH abuse who presented to [facility] on 10/25/19 with a 1 day history of slurred speech, facial droop, and AMS.  He did not receive IV t-PA due to late presentation (>4.5 hours from time of onset)  Stroke: Acute infarction affecting the left caudate and putamen and with patchy areas of involvement affecting the insular region and frontoparietal cortical and subcortical brain - possibly embolic -  unknown source.  Resultant  Right hemiparesis  Code Stroke CT Head - not ordered   CT head - not ordered  MRI head - Acute infarction affecting the left caudate and putamen and with patchy areas of involvement affecting the insular region and frontoparietal cortical and subcortical brain. Mild swelling but no hemorrhage. 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  MRA head - MR angiography shows occlusion of the superior division MCA M2 branch on the left.   CTA H&N - not ordered  CT Perfusion - not ordered  Carotid Doppler - no significant stenosis  2D Echo - EF 35%. No cardiac source of emboli identified.   Sars Corona Virus - negative  LDL - 134  HgbA1c - 6.3  UDS - cocaine  VTE prophylaxis - Lovenox Diet  Diet Order            Diet regular Room service appropriate? Yes; Fluid consistency: Thin  Diet effective now                  No antithrombotic prior to admission, now on aspirin 81 mg daily  Patient counseled to be compliant with his antithrombotic medications  Ongoing aggressive stroke risk factor management  Therapy recommendations:  CIR  Disposition:  Pending  Hypertension  Home BP meds: none   Current BP meds: none   Stable . Permissive hypertension (OK if < 220/120) but gradually normalize in 5-7 days  . Long-term BP goal normotensive  Hyperlipidemia  Home Lipid lowering medication: none  LDL 134, goal < 70  Current lipid lowering medication: Lipitor 40 mg daily   Continue statin at discharge  Other Stroke Risk Factors  Advanced age  Cigarette smoker - advised to stop smoking  ETOH abuse, advised to drink no more than 1 - 2 drink(s) a day  Cardiomyopathy  Substance Abuse  Other Active Problems  Code status - full code  Hyperglycemia  CKD  Chronic anemia  Substance abuse  Hospital day # 2  Assessment/Plan:  Multiple left MCA distribution ischemic infarcts likely secondary to left M2 occlusion and  distal atheroembolism.  The clot may be local thrombosis, but cannot rule out cardioembolism to the left M2.  Tele has been unrevealing, but low EF may be a risk factor for cardioembolism.  Current data, however, does not indicate anticoagulation in low EF without associated arrhythmia; studies are ongoing.  For now, we can continue ASA monotherapy.  Cont statin.  BP is good without treatment.  He is not diabetic.  No carotid stenosis in the neck.     We will sign off and he can follow up in stroke clinic after discharge.    Rogue Jury, MS, MD  To contact Stroke Continuity provider, please refer to [email or web address] After hours, contact General Neurology

## 2019-10-27 NOTE — Evaluation (Addendum)
Physical Therapy Evaluation Patient Details Name: Jeff [name] MRN: [id number] DOB: 07/10/1945 Today's Date: 10/27/2019   History of Present Illness  73yo male who began showing R sided weakness and facial droop on 6/30, brought to [facility] on 7/1 and subsequently transferred to [facility] due to MRI showing acute L caudate and putamen CVA, also L M2 MCA occlusion. No tpa given, out of window. PMH acute renal failture, EtOH abuse, cocaine abuse, metastatic prostate CA, UTI, B nephrostomy  Clinical Impression   Patient received in bed, pleasant and willing to participate in therapy today. See below for mobility/physical assist levels. Overall he demonstrates significant functional impairment due to acute CVA as well as significant difficulty with functional transfers and gait due to acute R sided weakness, impaired coordination and proprioception, impaired sensation, severe difficulty with gait, poor safety awareness, R inattention, and high fall risk. HR up to 135BPM with gait only 20ft but recovered to baseline (105-107BPM with seated rest). Left in bed with all needs met, bed alarm active. Feel he would do well with intensive therapies in the CIR setting.   Patient suffers from impaired and unsafe gait mechanics, R sided weakness and impaired coordination, and impaired balance which impairs their ability to perform daily activities like ambulate safely in the home.  A walker alone will not resolve the issues with performing activities of daily living. A wheelchair will allow patient to safely perform daily activities.  The patient can self propel in the home or has a caregiver who can provide assistance.        Follow Up Recommendations CIR    Equipment Recommendations  Rolling walker with 5" wheels;3in1 (PT);Wheelchair (measurements PT);Wheelchair cushion (measurements PT)    Recommendations for Other Services       Precautions / Restrictions Precautions Precautions: Fall;Other  (comment) Precaution Comments: R sided weakness and numbness, R inattention, impulsive Restrictions Weight Bearing Restrictions: No      Mobility  Bed Mobility Overal bed mobility: Needs Assistance Bed Mobility: Supine to Sit;Sit to Supine     Supine to sit: Supervision;HOB elevated Sit to supine: Supervision;HOB elevated   General bed mobility comments: S for safety and line management  Transfers Overall transfer level: Needs assistance Equipment used: Rolling walker (2 wheeled) Transfers: Sit to/from Stand Sit to Stand: Mod assist         General transfer comment: ModA to boost to upright standing and gain full hip extension  Ambulation/Gait Ambulation/Gait assistance: Mod assist Gait Distance (Feet): 20 Feet Assistive device: Rolling walker (2 wheeled) Gait Pattern/deviations: Step-to pattern;Decreased step length - left;Decreased stance time - right;Decreased dorsiflexion - right;Decreased weight shift to right;Drifts right/left;Trunk flexed;Wide base of support Gait velocity: decreased   General Gait Details: very wide BOS with poor weight shifting R, kept trying to push RW far too far away from him, unsteady to R, and with poor attention on R side with max cues to avoid running into obstacles  Stairs            Wheelchair Mobility    Modified Rankin (Stroke Patients Only) Modified Rankin (Stroke Patients Only) Pre-Morbid Rankin Score: No symptoms Modified Rankin: Moderately severe disability     Balance Overall balance assessment: Needs assistance Sitting-balance support: Bilateral upper extremity supported;Feet supported Sitting balance-Leahy Scale: Fair Sitting balance - Comments: S for static balance, mild posterior lean Postural control: Posterior lean Standing balance support: Bilateral upper extremity supported;During functional activity Standing balance-Leahy Scale: Poor Standing balance comment: Mod-maxA to maintain balance dynamically even    with RW                             Pertinent Vitals/Pain Pain Assessment: No/denies pain    Home Living Family/patient expects to be discharged to:: Private residence Living Arrangements: Alone;Non-relatives/Friends Available Help at Discharge: Friend(s);Available PRN/intermittently (friends come to check on him for several hours once every day) Type of Home: Apartment Home Access: Elevator     Home Layout: One level Home Equipment: None      Prior Function Level of Independence: Independent               Hand Dominance        Extremity/Trunk Assessment   Upper Extremity Assessment Upper Extremity Assessment: Defer to OT evaluation    Lower Extremity Assessment Lower Extremity Assessment: RLE deficits/detail;LLE deficits/detail RLE Deficits / Details: grossly estimated to be 3+/5 to 4/5, noticeably weaker than LLE but able to perform tasks against gravity RLE Sensation: decreased light touch RLE Coordination: decreased gross motor;decreased fine motor LLE Deficits / Details: WNL LLE Sensation: WNL LLE Coordination: WNL    Cervical / Trunk Assessment Cervical / Trunk Assessment: Normal  Communication   Communication: Expressive difficulties  Cognition Arousal/Alertness: Awake/alert Behavior During Therapy: Impulsive Overall Cognitive Status: No family/caregiver present to determine baseline cognitive functioning                                 General Comments: interacted appropriately with PT but impulsive with mobilty; speech difficult to understand which also made cognitive status unclear      General Comments      Exercises     Assessment/Plan    PT Assessment Patient needs continued PT services  PT Problem List Decreased strength;Decreased coordination;Cardiopulmonary status limiting activity;Decreased cognition;Impaired sensation;Decreased activity tolerance;Decreased knowledge of use of DME;Decreased  balance;Decreased safety awareness;Decreased mobility       PT Treatment Interventions DME instruction;Therapeutic exercise;Gait training;Balance training;Neuromuscular re-education;Functional mobility training;Cognitive remediation;Therapeutic activities;Patient/family education    PT Goals (Current goals can be found in the Care Plan section)  Acute Rehab PT Goals Patient Stated Goal: get better PT Goal Formulation: With patient Time For Goal Achievement: 11/10/19 Potential to Achieve Goals: Good    Frequency Min 4X/week   Barriers to discharge Decreased caregiver support lives alone with friends only checking in on him through the day    Co-evaluation               AM-PAC PT "6 Clicks" Mobility  Outcome Measure Help needed turning from your back to your side while in a flat bed without using bedrails?: None Help needed moving from lying on your back to sitting on the side of a flat bed without using bedrails?: A Little Help needed moving to and from a bed to a chair (including a wheelchair)?: A Lot Help needed standing up from a chair using your arms (e.g., wheelchair or bedside chair)?: A Lot Help needed to walk in hospital room?: A Lot Help needed climbing 3-5 steps with a railing? : Total 6 Click Score: 14    End of Session Equipment Utilized During Treatment: Gait belt Activity Tolerance: Treatment limited secondary to medical complications (Comment);Other (comment) (HR to 135 with activity) Patient left: in bed;with call bell/phone within reach;with bed alarm set Nurse Communication: Mobility status PT Visit Diagnosis: Unsteadiness on feet (R26.81);Other abnormalities of gait and mobility (R26.89);Muscle weakness (generalized) (  M62.81);Difficulty in walking, not elsewhere classified (R26.2);Hemiplegia and hemiparesis;Other symptoms and signs involving the nervous system (R29.898) Hemiplegia - Right/Left: Right Hemiplegia - dominant/non-dominant:  Dominant Hemiplegia - caused by: Cerebral infarction    Time: 0940-1011 PT Time Calculation (min) (ACUTE ONLY): 31 min   Charges:   PT Evaluation $PT Eval Moderate Complexity: 1 Mod PT Treatments $Gait Training: 8-22 mins        Kristen U PT, DPT, PN1   Supplemental Physical Therapist     Pager [phone] Acute Rehab Office [phone]    

## 2019-10-27 NOTE — Evaluation (Signed)
Occupational Therapy Evaluation Patient Details Name: Jeff [name] MRN: [id number] DOB: [birth year] Today's Date: 10/27/2019    History of Present Illness 74yo male who began showing R sided weakness and facial droop on 6/30, brought to [place] on 7/1 and subsequently transferred to [facility] due to MRI showing acute L caudate and putamen CVA, also L M2 MCA occlusion. No tpa given, out of window. PMH acute renal failture, EtOH abuse, cocaine abuse, metastatic prostate CA, UTI, B nephrostomy   Clinical Impression   Pt PTA: Pt living with non family members/friends and reports independence. Pt currently limited by decreased cognition, decreased expressive deficits, decreased mobility and decreased ability to care for self.  Pt minA to modA for balance at sink for ADL tasks pt maxA overall for ADL tasks and modA overall for mobility tasks with RW. Pt requires cues to attend to task and sequence through task safely. Pt will say  "mhmm" and then not perform command. RUE with weakness and coordination deficits noted.  HR up to 139 BPM; RN aware. HR returned to 90 BPM with seated rest break. Pt would greatly benefit from continued OT skilled services in CIR setting and would be an excellent candidate. OT following acutely.     Follow Up Recommendations  CIR    Equipment Recommendations  Other (comment) (defer to next facility)    Recommendations for Other Services Rehab consult     Precautions / Restrictions Precautions Precautions: Fall;Other (comment) Precaution Comments: R sided weakness and numbness, R inattention, impulsive Restrictions Weight Bearing Restrictions: No      Mobility Bed Mobility Overal bed mobility: Needs Assistance Bed Mobility: Supine to Sit;Sit to Supine     Supine to sit: Supervision;HOB elevated Sit to supine: Supervision;HOB elevated   General bed mobility comments: S for safety and line management  Transfers Overall transfer level: Needs  assistance Equipment used: Rolling walker (2 wheeled) Transfers: Sit to/from Stand Sit to Stand: Mod assist         General transfer comment: Pt modA overall for power up and for initial standing balance    Balance Overall balance assessment: Needs assistance Sitting-balance support: Bilateral upper extremity supported;Feet supported Sitting balance-Leahy Scale: Fair   Postural control: Posterior lean Standing balance support: Single extremity supported;During functional activity Standing balance-Leahy Scale: Poor Standing balance comment: minA to modA for balance at sink for ADL tasks                           ADL either performed or assessed with clinical judgement   ADL Overall ADL's : Needs assistance/impaired Eating/Feeding: Set up;Sitting   Grooming: Minimal assistance;Standing Grooming Details (indicate cue type and reason): standing at sink x5 mins for light grooming; assist with opening toothbrush plastic; able to open and close toothpaste tube top Upper Body Bathing: Minimal assistance;Sitting   Lower Body Bathing: Maximal assistance;Sitting/lateral leans;Sit to/from stand   Upper Body Dressing : Minimal assistance;Sitting   Lower Body Dressing: Maximal assistance;Sitting/lateral leans;Sit to/from stand;Cueing for safety Lower Body Dressing Details (indicate cue type and reason): MaxA overall for LB dressing; RUE unable to grip sock Toilet Transfer: Moderate assistance;Ambulation;Cueing for safety;Cueing for sequencing   Toileting- Clothing Manipulation and Hygiene: Maximal assistance;Cueing for safety;Cueing for sequencing;Sitting/lateral lean;Sit to/from stand       Functional mobility during ADLs: Moderate assistance;Cueing for safety;Cueing for sequencing;Rolling walker General ADL Comments: Pt limited by decreased cognition, decreased expressive deficits, decreased mobility and decreased ability to care for self.  Vision Baseline  Vision/History: No visual deficits Patient Visual Report: No change from baseline Vision Assessment?: Yes Eye Alignment: Within Functional Limits Ocular Range of Motion: Within Functional Limits Alignment/Gaze Preference: Within Defined Limits Tracking/Visual Pursuits: Able to track stimulus in all quads without difficulty Saccades: Within functional limits     Perception     Praxis      Pertinent Vitals/Pain Pain Assessment: No/denies pain     Hand Dominance Left (raised L hand as dominant)   Extremity/Trunk Assessment Upper Extremity Assessment Upper Extremity Assessment: Generalized weakness;RUE deficits/detail;LUE deficits/detail RUE Deficits / Details: shoulder 2/5 MM grade; elbow through digits 2 to 2+/5 mm grade RUE Sensation: decreased light touch RUE Coordination: decreased fine motor;decreased gross motor LUE Deficits / Details: AROM, WFLs. 4/5 MM grade   Lower Extremity Assessment Lower Extremity Assessment: Defer to PT evaluation;RLE deficits/detail RLE Deficits / Details: weaknesss   Cervical / Trunk Assessment Cervical / Trunk Assessment: Normal   Communication Communication Communication: Expressive difficulties   Cognition Arousal/Alertness: Awake/alert Behavior During Therapy: Flat affect Overall Cognitive Status: Impaired/Different from baseline Area of Impairment: Safety/judgement;Awareness;Problem solving                         Safety/Judgement: Decreased awareness of safety;Decreased awareness of deficits Awareness: Intellectual Problem Solving: Slow processing;Decreased initiation;Requires verbal cues;Requires tactile cues General Comments: Pt with expressive difficulties s/p CVA. Pt following 1 step commands consistently. Pt requires cues to use RW and perform step to gait pattern safely.    General Comments  HR up to 139 BPM; RN aware. HR returned to 90 BPM with seated rest break.    Exercises     Shoulder Instructions       Home Living Family/patient expects to be discharged to:: Private residence Living Arrangements: Alone;Non-relatives/Friends Available Help at Discharge: Friend(s);Available PRN/intermittently Type of Home: Apartment Home Access: Elevator     Home Layout: One level     Bathroom Shower/Tub: Teacher, early years/pre: Standard Bathroom Accessibility: Yes   Home Equipment: None          Prior Functioning/Environment Level of Independence: Independent        Comments: Lives with friends; reports independence prior        OT Problem List: Decreased strength;Decreased activity tolerance;Impaired balance (sitting and/or standing);Decreased range of motion;Decreased coordination;Decreased cognition;Decreased safety awareness;Impaired vision/perception;Pain;Impaired UE functional use;Increased edema      OT Treatment/Interventions: Self-care/ADL training;Therapeutic exercise;Energy conservation;DME and/or AE instruction;Therapeutic activities;Visual/perceptual remediation/compensation;Patient/family education;Balance training    OT Goals(Current goals can be found in the care plan section) Acute Rehab OT Goals Patient Stated Goal: get better OT Goal Formulation: With patient Time For Goal Achievement: 11/10/19 Potential to Achieve Goals: Good ADL Goals Pt Will Perform Grooming: with min guard assist;standing Pt Will Perform Lower Body Dressing: with min assist;sitting/lateral leans;sit to/from stand Pt Will Transfer to Toilet: with min guard assist;ambulating Pt/caregiver will Perform Home Exercise Program: Increased strength;Left upper extremity;With written HEP provided;With minimal assist Additional ADL Goal #1: Pt will follow (2) multi step commands in a minimally distracting environment with minimal cues to attend to task.  OT Frequency: Min 2X/week   Barriers to D/C:            Co-evaluation              AM-PAC OT "6 Clicks" Daily Activity      Outcome Measure Help from another person eating meals?: None Help from another person taking care of personal  grooming?: A Little Help from another person toileting, which includes using toliet, bedpan, or urinal?: A Lot Help from another person bathing (including washing, rinsing, drying)?: A Lot Help from another person to put on and taking off regular upper body clothing?: A Little Help from another person to put on and taking off regular lower body clothing?: A Lot 6 Click Score: 16   End of Session Equipment Utilized During Treatment: Gait belt;Rolling walker Nurse Communication: Mobility status  Activity Tolerance: Patient tolerated treatment well;Patient limited by fatigue;Patient limited by lethargy Patient left: in bed;with call bell/phone within reach;with bed alarm set  OT Visit Diagnosis: Unsteadiness on feet (R26.81);Muscle weakness (generalized) (M62.81);Other symptoms and signs involving cognitive function;Hemiplegia and hemiparesis Hemiplegia - Right/Left: Right Hemiplegia - dominant/non-dominant: Non-Dominant Hemiplegia - caused by: Cerebral infarction                Time: 1210-1234 OT Time Calculation (min): 24 min Charges:  OT General Charges $OT Visit: 1 Visit OT Evaluation $OT Eval Moderate Complexity: 1 Mod OT Treatments $Neuromuscular Re-education: 8-22 mins  Jefferey Pica, OTR/L Acute Rehabilitation Services Pager: [phone] Office: [place] 10/27/2019, 3:43 PM

## 2019-10-27 NOTE — Progress Notes (Addendum)
PROGRESS NOTE    Jeff [name]  NWG:[id number] DOB: [birth year] DOA: 10/25/2019 PCP: Holley Bouche, NP (Inactive)    Brief Narrative:  74 year old male with a history of prostate cancer, cocaine use, bilateral hydronephrosis, B12 deficiency, CKD stage III presenting with slurred speech and facial droop.  The patient is a poor historian.  Apparently he had a friend that noticed that he was having speech difficulty and facial droop and activated EMS.  Apparently this started on 10/24/2019.  The patient is unable to tell me any specific details regarding his facial droop or slurred speech other than the fact that he has noticed some speech difficulty.  The patient denies any headache, visual disturbance, focal extremity weakness, fevers, chills, chest pain, shortness breath, coughing, hemoptysis, nausea, vomiting, diarrhea, abdominal pain, dysuria.  He states that he has used cocaine within the past week.  He is unable to provide any significant details particularly with regard to timing of his neurologic deficits.  He is unable to tell me how long he has been using cocaine how often.  He smokes 1/2 pack/day.  In addition, the patient states that he drinks alcohol regularly although is unable to tell me the amount or frequency or last use of alcohol.  In the emergency department, the patient is awake and alert and follows commands.  He has been afebrile and hemodynamically stable albeit with soft blood pressures.  Oxygen saturation is 100% on room air.  CT of the brain showed a hypodensity in the left basal ganglia consistent with subacute infarct in the putamen and caudate nucleus.  BMP showed a serum creatinine of one-point 6 repeat LFTs were unremarkable.  WBC 4.2, hemoglobin 10.2, platelets 201,000.  The patient was admitted for further evaluation and treatment of his possible stroke.  EDP contacted Dr. Cheral Marker  Assessment & Plan:   Active Problems:   CVA (cerebral vascular accident) ([place])    Acute ischemic stroke ([place])   Cocaine abuse ([place])   CKD (chronic kidney disease) stage 3, GFR 30-59 ml/min   Acute metabolic encephalopathy   Prostate cancer metastatic to bone [facility])  Ischemic CVA -PT/OT recommendations for CIR noted -CT brain--hypodensity left basal ganglia consistent with subacute infarct in the putamen and caudate nucleus -MRI brain confirms acute infarct affecting the L caudate and putamen with patchy areas of involvement affecting the insular region and frontoparietal cortical and subcortical brain -Carotid Duplex without stenosis -Echo reviewed. See below. New EF of 35% ntoed -LDL 134, to continue atorvastatin 40mg  daily -HbA1C 6.3 -Antiplatelet--aspirin 81 mg daily -Neurology was consulted. BP goal to normalize to goal of <140/90 by discharge  CKD stage IIIb -Baseline creatinine 5.7-8.4  Metabolic encephalopathy -Unclear baseline -TSH of 1.083 -B12  of 1281 -Ammonia normal range -UA--no pyuria -remains slow in responses but responds appropriatelhy  Metastatic prostate cancer -Patient follows Dr. 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 Echo reviewed personally -Pt denies chest pain or sob. Currently euvolemic with no LE edema  -have discussed with Cardiology on call. Recommendation for ultimately as BP tolerates and follow up with Cardiology as outpatient. -BP currently 105/67, thus will hold off on ARB at this time -Given cocaine abuse hx, would avoid beta blocker  DVT prophylaxis: Lovenox subq Code Status: Full Family Communication: Pt in room,  family not at bedside  Status is: Inpatient  Remains inpatient appropriate because:Unsafe d/c plan and Inpatient level  of care appropriate due to severity of illness   Dispo: The patient is from: Home              Anticipated d/c is to: CIR              Anticipated d/c date is: 2 days              Patient currently is medically stable to d/c. Just awaiting placeent   Consultants:   Neurology  Procedures:     Antimicrobials: Anti-infectives (From admission, onward)   None       Subjective: Without complaints at this time  Objective: Vitals:   10/27/19 0500 10/27/19 0600 10/27/19 0733 10/27/19 1117  BP:   106/67 105/67  Pulse: (!) 57 63 (!) 54 64  Resp: 12 17 10 14   Temp:   98.2 F (36.8 C) 98.2 F (36.8 C)  TempSrc:   Oral Oral  SpO2: 95% 97% 97% 98%  Weight:      Height:        Intake/Output Summary (Last 24 hours) at 10/27/2019 1507 Last data filed at 10/27/2019 1302 Gross per 24 hour  Intake 1034.65 ml  Output --  Net 1034.65 ml   Filed Weights   10/25/19 1312  Weight: 76.2 kg    Examination:  General exam: Appears calm and comfortable  Respiratory system: Clear to auscultation. Respiratory effort normal. Cardiovascular system: S1 & S2 heard, Regular Gastrointestinal system: Abdomen is nondistended, soft and nontender. No organomegaly or masses felt. Normal bowel sounds heard. Central nervous system: Alert and oriented. R sided facial droop. Extremities: Symmetric 5 x 5 power. Skin: No rashes, lesions Psychiatry: Judgement and insight appear normal. Mood & affect appropriate.   Data Reviewed: I have personally reviewed following labs and imaging studies  CBC: Recent Labs  Lab 10/25/19 1426 10/25/19 1441  WBC  --  4.2  NEUTROABS  --  3.0  HGB 10.9* 10.2*  HCT 32.0* 31.7*  MCV  --  92.7  PLT  --  045   Basic Metabolic Panel: Recent Labs  Lab 10/25/19 1426 10/25/19 1441 10/27/19 0415  NA 143 138 141  K 4.4 4.4 4.0  CL 111 108 110  CO2  --  21* 21*  GLUCOSE 92 92 92  BUN 29* 28* 22  CREATININE 1.80* 1.63* 1.34*  CALCIUM  --  9.2 9.3    GFR: Estimated Creatinine Clearance: 53.7 mL/min (A) (by C-G formula based on SCr of 1.34 mg/dL (H)). Liver Function Tests: Recent Labs  Lab 10/25/19 1441  AST 27  ALT 14  ALKPHOS 48  BILITOT 0.7  PROT 8.2*  ALBUMIN 3.9   No results for input(s): LIPASE, AMYLASE in the last 168 hours. Recent Labs  Lab 10/26/19 0738  AMMONIA 18   Coagulation Profile: Recent Labs  Lab 10/25/19 1441  INR 1.1   Cardiac Enzymes: No results for input(s): CKTOTAL, CKMB, CKMBINDEX, TROPONINI in the last 168 hours. BNP (last 3 results) No results for input(s): PROBNP in the last 8760 hours. HbA1C: Recent Labs    10/25/19 1441  HGBA1C 6.3*   CBG: No results for input(s): GLUCAP in the last 168 hours. Lipid Profile: Recent Labs    10/26/19 0418  CHOL 192  HDL 42  LDLCALC 134*  TRIG 81  CHOLHDL 4.6   Thyroid Function Tests: Recent Labs    10/26/19  0738  TSH 1.083  FREET4 1.11   Anemia Panel: Recent Labs    10/26/19 0738  VITAMINB12 1,281*  FOLATE 7.9   Sepsis Labs: No results for input(s): PROCALCITON, LATICACIDVEN in the last 168 hours.  Recent Results (from the past 240 hour(s))  Urine culture     Status: Abnormal   Collection Time: 10/26/19 12:44 AM   Specimen: Urine, Clean Catch  Result Value Ref Range Status   Specimen Description   Final    URINE, CLEAN CATCH Performed at [facility], [address], [place], [place] [address]    Special Requests   Final    NONE Performed at [facility], [address], [place], [place] [address]    Culture MULTIPLE SPECIES PRESENT, SUGGEST RECOLLECTION (A)  Final   Report Status 10/27/2019 FINAL  Final  SARS Coronavirus 2 by RT PCR (hospital order, performed in [facility] hospital lab) Nasopharyngeal Nasopharyngeal Swab     Status: None   Collection Time: 10/26/19 12:44 AM   Specimen: Nasopharyngeal Swab  Result Value Ref Range Status   SARS Coronavirus 2 NEGATIVE NEGATIVE Final    Comment: (NOTE) SARS-CoV-2 target  nucleic acids are NOT DETECTED.  The SARS-CoV-2 RNA is generally detectable in upper and lower respiratory specimens during the acute phase of infection. The lowest concentration of SARS-CoV-2 viral copies this assay can detect is 250 copies / mL. A negative result does not preclude SARS-CoV-2 infection and should not be used as the sole basis for treatment or other patient management decisions.  A negative result may occur with improper specimen collection / handling, submission of specimen other than nasopharyngeal swab, presence of viral mutation(s) within the areas targeted by this assay, and inadequate number of viral copies (<250 copies / mL). A negative result must be combined with clinical observations, patient history, and epidemiological information.  Fact Sheet for Patients:   StrictlyIdeas.no  Fact Sheet for Healthcare Providers: BankingDealers.co.za  This test is not yet approved or  cleared by the [place] FDA and has been authorized for detection and/or diagnosis of SARS-CoV-2 by FDA under an Emergency Use Authorization (EUA).  This EUA will remain in effect (meaning this test can be used) for the duration of the COVID-19 declaration under Section 564(b)(1) of the Act, 21 U.S.C. section 360bbb-3(b)(1), unless the authorization is terminated or revoked sooner.  Performed at [facility], [address], [place], [place] [address]      Radiology Studies: MR ANGIO HEAD WO CONTRAST  Result Date: 10/26/2019 CLINICAL DATA:  Slurred speech. Right facial droop. Right-sided weakness. EXAM: MRI HEAD WITHOUT CONTRAST MRA HEAD WITHOUT CONTRAST TECHNIQUE: Multiplanar, multiecho pulse sequences of the brain and surrounding structures were obtained without intravenous contrast. Angiographic images of the head were obtained using MRA technique without contrast. COMPARISON:  Head CT 10/25/2019 FINDINGS: MRI HEAD FINDINGS Brain:  Diffusion imaging shows acute infarction in the right middle cerebral artery territory affecting the corpus striata arm portion of the basal ganglia, the insular region and the frontoparietal brain in a patchy and scattered fashion. No large confluent infarction. Mild swelling in the regions of infarction but no mass effect. No discernible hemorrhagic transformation. Elsewhere, the brainstem is normal. There are old small vessel infarctions within the cerebellum on the left. The cerebral hemispheres do not show a pattern of widespread small-vessel disease. No mass lesion, hydrocephalus or extra-axial collection. Vascular: Major vessels at the base of the brain show flow. Skull and upper cervical spine: Negative Sinuses/Orbits: Clear/normal Other:  None MRA HEAD FINDINGS Both internal carotid arteries are widely patent through the skull base and siphon regions. The right middle cerebral artery and both anterior cerebral arteries show normal flow. The left middle cerebral artery is patent to the bifurcation. The inferior division shows flow but the superior division is occluded. Both vertebral arteries are patent through the foramen magnum with the right being dominant. The left vertebral artery supplies PICA, with a tiny contribution to the basilar. Dominant right vertebral artery widely patent to the basilar. No basilar stenosis. Superior cerebellar and posterior cerebral arteries show flow. IMPRESSION: Acute infarction affecting the left caudate and putamen and with patchy areas of involvement affecting the insular region and frontoparietal cortical and subcortical brain. Mild swelling but no hemorrhage. 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 MR angiography shows occlusion of the superior division MCA M2 branch on the left. Electronically Signed   By: Nelson Chimes M.D.   On: 10/26/2019 15:26   MR BRAIN WO CONTRAST  Result Date: 10/26/2019 CLINICAL DATA:  Slurred speech. Right facial droop.  Right-sided weakness. EXAM: MRI HEAD WITHOUT CONTRAST MRA HEAD WITHOUT CONTRAST TECHNIQUE: Multiplanar, multiecho pulse sequences of the brain and surrounding structures were obtained without intravenous contrast. Angiographic images of the head were obtained using MRA technique without contrast. COMPARISON:  Head CT 10/25/2019 FINDINGS: MRI HEAD FINDINGS Brain: Diffusion imaging shows acute infarction in the right middle cerebral artery territory affecting the corpus striata arm portion of the basal ganglia, the insular region and the frontoparietal brain in a patchy and scattered fashion. No large confluent infarction. Mild swelling in the regions of infarction but no mass effect. No discernible hemorrhagic transformation. Elsewhere, the brainstem is normal. There are old small vessel infarctions within the cerebellum on the left. The cerebral hemispheres do not show a pattern of widespread small-vessel disease. No mass lesion, hydrocephalus or extra-axial collection. Vascular: Major vessels at the base of the brain show flow. Skull and upper cervical spine: Negative Sinuses/Orbits: Clear/normal Other: None MRA HEAD FINDINGS Both internal carotid arteries are widely patent through the skull base and siphon regions. The right middle cerebral artery and both anterior cerebral arteries show normal flow. The left middle cerebral artery is patent to the bifurcation. The inferior division shows flow but the superior division is occluded. Both vertebral arteries are patent through the foramen magnum with the right being dominant. The left vertebral artery supplies PICA, with a tiny contribution to the basilar. Dominant right vertebral artery widely patent to the basilar. No basilar stenosis. Superior cerebellar and posterior cerebral arteries show flow. IMPRESSION: Acute infarction affecting the left caudate and putamen and with patchy areas of involvement affecting the insular region and frontoparietal cortical and  subcortical brain. Mild swelling but no hemorrhage. 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 MR angiography shows occlusion of the superior division MCA M2 branch on the left. Electronically Signed   By: Nelson Chimes M.D.   On: 10/26/2019 15:26   US Carotid Bilateral (at [facility] and [facility] only)  Result Date: 10/26/2019 CLINICAL DATA:  74 year old male with a history of stroke EXAM: BILATERAL CAROTID DUPLEX ULTRASOUND TECHNIQUE: Pearline Cables scale imaging, color Doppler and duplex ultrasound were performed of bilateral carotid and vertebral arteries in the neck. COMPARISON:  None. FINDINGS: Criteria: Quantification of carotid stenosis is based on velocity parameters that correlate the residual internal carotid diameter with NASCET-based stenosis levels, using the diameter of the distal internal carotid lumen as the denominator for stenosis measurement. The following  velocity measurements were obtained: RIGHT ICA:  Systolic 81 cm/sec, Diastolic 29 cm/sec CCA:  82 cm/sec SYSTOLIC ICA/CCA RATIO:  1.0 ECA:  70 cm/sec LEFT ICA:  Systolic 78 cm/sec, Diastolic 25 cm/sec CCA:  91 cm/sec SYSTOLIC ICA/CCA RATIO:  0.9 ECA:  75 cm/sec Right Brachial SBP: Not acquired Left Brachial SBP: Not acquired RIGHT CAROTID ARTERY: No significant calcified disease of the right common carotid artery. Intermediate waveform maintained. Heterogeneous plaque without significant calcifications at the right carotid bifurcation. Low resistance waveform of the right ICA. No significant tortuosity. RIGHT VERTEBRAL ARTERY: Antegrade flow with low resistance waveform. LEFT CAROTID ARTERY: No significant calcified disease of the left common carotid artery. Intermediate waveform maintained. Heterogeneous plaque at the left carotid bifurcation without significant calcifications. Low resistance waveform of the left ICA. LEFT VERTEBRAL ARTERY:  Antegrade flow with low resistance waveform. IMPRESSION: Color duplex indicates minimal heterogeneous plaque,  with no hemodynamically significant stenosis by duplex criteria in the extracranial cerebrovascular circulation. Signed, Dulcy Fanny. Dellia Nims, RPVI [facility] Radiology Electronically Signed   By: Corrie Mckusick D.O.   On: 10/26/2019 11:10   ECHOCARDIOGRAM COMPLETE  Result Date: 10/26/2019    ECHOCARDIOGRAM REPORT   Patient Name:   Lorain Cleda Clarks Date of Exam: 10/26/2019 Medical Rec #:  [id number]       Height:       72.0 in Accession #:    [id number]      Weight:       168.0 lb Date of Birth:  11/28/1945       BSA:          1.978 m Patient Age:    69 years        BP:           114/74 mmHg Patient Gender: M               HR:           59 bpm. Exam Location:  Inpatient Procedure: 2D Echo                              MODIFIED REPORT: This report was modified by Cherlynn Kaiser MD on 10/26/2019 due to revision.  Indications:     Stroke 434.91 / I163.9  History:         Patient has no prior history of Echocardiogram examinations.                  Signs/Symptoms:Hypotension. Cocaine use, prostate cancer,                  bilateral hydronephrosis metastatic prostate cancer, Metabolic                  encephalopathy, Normocytic anemia, chronic kidney disease.  Sonographer:     Darlina Sicilian RDCS Referring Phys:  [id number] Leanne Chang [facility] Diagnosing Phys: Cherlynn Kaiser MD  Sonographer Comments: No IV access, unable to perform Definity to entirely exclude LV thrombus. IMPRESSIONS  1. Left ventricular ejection fraction, by estimation, is 35%. The left ventricle has moderately decreased function. The left ventricle demonstrates regional wall motion abnormalities (see scoring diagram/findings for description). Left ventricular diastolic parameters are indeterminate.  2. Right ventricular systolic function is normal. The right ventricular size is normal. Tricuspid regurgitation signal is inadequate for assessing PA pressure.  3. The mitral valve is normal in structure. Trivial  mitral valve regurgitation. No evidence of mitral  stenosis.  4. The aortic valve is tricuspid. Aortic valve regurgitation is mild. No aortic stenosis is present.  5. Aortic dilatation noted. There is borderline dilatation of the aortic root measuring 39 mm.  6. The inferior vena cava is normal in size with greater than 50% respiratory variability, suggesting right atrial pressure of 3 mmHg. Conclusion(s)/Recommendation(s): No intracardiac source of embolism detected on this transthoracic study. A transesophageal echocardiogram is recommended to exclude cardiac source of embolism if clinically indicated. FINDINGS  Left Ventricle: Left ventricular ejection fraction, by estimation, is 35%. The left ventricle has moderately decreased function. The left ventricle demonstrates regional wall motion abnormalities. The left ventricular internal cavity size was normal in size. There is no left ventricular hypertrophy. Left ventricular diastolic parameters are indeterminate.  LV Wall Scoring: Mild basal hypokinesis with severe hypokinesis of the apex. Right Ventricle: The right ventricular size is normal. No increase in right ventricular wall thickness. Right ventricular systolic function is normal. Tricuspid regurgitation signal is inadequate for assessing PA pressure. Left Atrium: Left atrial size was normal in size. Right Atrium: Right atrial size was normal in size. Pericardium: A small pericardial effusion is present. Mitral Valve: The mitral valve is normal in structure. Normal mobility of the mitral valve leaflets. Trivial mitral valve regurgitation. No evidence of mitral valve stenosis. Tricuspid Valve: The tricuspid valve is normal in structure. Tricuspid valve regurgitation is trivial. No evidence of tricuspid stenosis. Aortic Valve: The aortic valve is tricuspid. . There is mild thickening of the aortic valve. Aortic valve regurgitation is mild. No aortic stenosis is present. There is mild thickening of the aortic  valve. Pulmonic Valve: The pulmonic valve was grossly normal. Pulmonic valve regurgitation is trivial. No evidence of pulmonic stenosis. Aorta: Aortic dilatation noted and the ascending aorta was not well visualized. There is borderline dilatation of the aortic root measuring 39 mm. Venous: The inferior vena cava is normal in size with greater than 50% respiratory variability, suggesting right atrial pressure of 3 mmHg. IAS/Shunts: The interatrial septum was not well visualized.  LEFT VENTRICLE PLAX 2D LVIDd:         5.20 cm      Diastology LVIDs:         3.80 cm      LV e' lateral: 5.66 cm/s LV PW:         0.90 cm      LV e' medial:  4.57 cm/s LV IVS:        1.00 cm LVOT diam:     2.00 cm LV SV:         60 LV SV Index:   30 LVOT Area:     3.14 cm     3D Volume EF:                             3D EF:        37 %                             LV EDV:       149 ml LV Volumes (MOD)            LV ESV:       93 ml LV vol d, MOD A2C: 123.5 ml LV SV:        56 ml LV vol d, MOD A4C: 124.0 ml LV vol s, MOD A2C: 95.9 ml  LV vol s, MOD A4C: 62.6 ml LV SV MOD A2C:     27.7 ml LV SV MOD A4C:     124.0 ml LV SV MOD BP:      45.2 ml RIGHT VENTRICLE RV S prime:     12.20 cm/s TAPSE (M-mode): 1.9 cm LEFT ATRIUM             Index       RIGHT ATRIUM           Index LA diam:        3.30 cm 1.67 cm/m  RA Area:     10.30 cm LA Vol (A2C):   65.4 ml 33.06 ml/m RA Volume:   20.80 ml  10.51 ml/m LA Vol (A4C):   51.0 ml 25.78 ml/m LA Biplane Vol: 57.0 ml 28.81 ml/m  AORTIC VALVE LVOT Vmax:   108.00 cm/s LVOT Vmean:  64.500 cm/s LVOT VTI:    0.190 m  AORTA Ao Root diam: 3.90 cm  SHUNTS Systemic VTI:  0.19 m Systemic Diam: 2.00 cm Cherlynn Kaiser MD Electronically signed by Cherlynn Kaiser MD Signature Date/Time: 10/26/2019/3:58:27 PM    Final (Updated)     Scheduled Meds: .  stroke: mapping our early stages of recovery book   Does not apply Once  . aspirin EC  81 mg Oral Daily  . atorvastatin  40 mg Oral Daily  . Chlorhexidine  Gluconate Cloth  6 each Topical Daily  . enoxaparin (LOVENOX) injection  40 mg Subcutaneous QHS  . sodium chloride flush  10-40 mL Intracatheter Q12H   Continuous Infusions:   LOS: 2 days   Marylu Lund, MD [facility] Pager On Amion  If 7PM-7AM, please contact night-coverage 10/27/2019, 3:07 PM

## 2019-10-28 LAB — COMPREHENSIVE METABOLIC PANEL
ALT: 14 U/L (ref 0–44)
AST: 23 U/L (ref 15–41)
Albumin: 3 g/dL — ABNORMAL LOW (ref 3.5–5.0)
Alkaline Phosphatase: 43 U/L (ref 38–126)
Anion gap: 9 (ref 5–15)
BUN: 16 mg/dL (ref 8–23)
CO2: 23 mmol/L (ref 22–32)
Calcium: 9.1 mg/dL (ref 8.9–10.3)
Chloride: 107 mmol/L (ref 98–111)
Creatinine, Ser: 1.3 mg/dL — ABNORMAL HIGH (ref 0.61–1.24)
GFR calc Af Amer: >60 mL/min (ref >60)
GFR calc non Af Amer: 55 mL/min — ABNORMAL LOW (ref >60)
Glucose, Bld: 90 mg/dL (ref 70–99)
Potassium: 3.8 mmol/L (ref 3.5–5.1)
Sodium: 139 mmol/L (ref 135–145)
Total Bilirubin: 0.6 mg/dL (ref 0.3–1.2)
Total Protein: 6.8 g/dL (ref 6.5–8.1)

## 2019-10-28 LAB — MAGNESIUM: Magnesium: 1.4 mg/dL — ABNORMAL LOW (ref 1.7–2.4)

## 2019-10-28 MED ORDER — MAGNESIUM SULFATE 4 GM/100ML IV SOLN
4.0000 g | Freq: Once | INTRAVENOUS | Status: AC
  Administered 2019-10-28: 4 g via INTRAVENOUS
  Filled 2019-10-28: qty 100

## 2019-10-28 NOTE — Evaluation (Signed)
Speech Language Pathology Evaluation Patient Details Name: Jeff [name] MRN: [id number] DOB: 02/16/1946 Today's Date: 10/28/2019 Time: 8416-6063 SLP Time Calculation (min) (ACUTE ONLY): 29 min  Problem List:  Patient Active Problem List   Diagnosis Date Noted  . Acute ischemic stroke ([place]) 10/26/2019  . Cocaine abuse ([place]) 10/26/2019  . CKD (chronic kidney disease) stage 3, GFR 30-59 ml/min 10/26/2019  . Acute metabolic encephalopathy 01/60/1093  . Prostate cancer metastatic to bone ([place]) 10/26/2019  . CVA (cerebral vascular accident) ([place]) 10/25/2019  . Port or reservoir infection   . B12 deficiency 04/01/2016  . Malnutrition of moderate degree ([place]) 05/24/2014  . Bacteremia 05/24/2014  . Pyrexia   . Sepsis ([place]) 05/23/2014  . UTI (lower urinary tract infection) 05/23/2014  . Health care maintenance 02/06/2014  . Homelessness 01/31/2014  . Malignant neoplasm of prostate ([place]) 01/30/2014  . Bone lesion 01/26/2014  . Acute renal failure ([place]) 01/26/2014  . Bilateral hydronephrosis 01/26/2014  . Anemia of chronic disease 01/26/2014  . History of cocaine abuse ([place]) 01/26/2014   Past Medical History:  Past Medical History:  Diagnosis Date  . Acute renal failure ([place]) 01/26/2014  . Alcohol abuse   . Anemia of chronic disease 01/26/2014  . Bilateral hydronephrosis 01/26/2014  . History of cocaine abuse ([place]) 01/26/2014  . Homelessness 01/31/2014  . Prostate cancer metastatic to multiple sites ([place]) 01/30/2014  . Sepsis ([place])   . UTI (lower urinary tract infection)    Past Surgical History:  Past Surgical History:  Procedure Laterality Date  . ORCHIECTOMY Bilateral 02/05/2014   Procedure: BILATERAL ORCHIECTOMY;  Surgeon: Festus Aloe, MD;  Location: [facility];  Service: Urology;  Laterality: Bilateral;  . PERCUTANEOUS NEPHROSTOMY Bilateral    IR Dr. Junious Silk changed on 04/22/2014  . PORT-A-CATH REMOVAL Right 08/08/2017   Procedure: REMOVAL PORT-A-CATH RIGHT CHEST (PROCEDURE  #2);  Surgeon: Aviva Signs, MD;  Location: [facility];  Service: General;  Laterality: Right;  . PORTACATH PLACEMENT Right 03/12/14  . PORTACATH PLACEMENT Left 08/08/2017   Procedure: INSERTION POWER PORT WITH ATTACHED CATHETER LEFT SUBCLAVIAN (PROCEDURE #1);  Surgeon: Aviva Signs, MD;  Location: [facility];  Service: General;  Laterality: Left;   HPI:  Jeff [name] is a 74 y.o. male with medical history significant for cocaine use, prostate cancer, bilateral hydronephrosis and metastatic prostate cancer.  He was brought to the ED via EMS due to reports of right-sided weakness, and facial droop the started yesterday.  He also had slurred speech.  Patient was confused prior to arrival in the ED.  Patient's friend told EMS that patient seems to have weakness and trouble speaking.  He was last known normal yesterday.  At the time of admission, he was awake, alert, answering simple questions, but was not able to give a lot of details.  He was aware that his speech was abnormal.  He reported it started yesterday.  He was unaware of facial asymmetry and not able to report if he has weakness. He denied any difficulty breathing or chest pain.  He reported no cough, fevers or chills,  MRI was showing acute infarction affecting the left caudate and putamen and with patchy areas of involvement affecting the insular region and frontparietal cortical and subcortical brain.  He reported that he lives alone and has no family in the area.  He was not driving prior to admission.  He stated a friend took him grocery shopping.  He was completing basic cooking and cleaning.  He reported he stopped taking all  his medication because it was "doing him no good" so he was not managing his home medications.     Assessment / Plan / Recommendation Clinical Impression  Cognitive/linguistic and motor speech evaluation was completed.  Cranial nerve exam was completed and remarkable for right sided facial droop/weakness.  Lingual range of  motion appeared adequate with mildly reduced strength on the right.  Labial droop seen on the right but no obvious labial weakness was noted.  Jaw opening and strength appeared to be adequate.  Facial sensation appeared to be intact and he did not endorse a difference in sensation from the left to right side of his face.  Speech intelligibility was reduced and appeared due to dysarthria.    Deficits were definitely noted for the following subsystems:  phonation, resonance and articulation.  Vocal volume was low and breath support was poor.  Max phonation time was 11 seconds.  Significant hypernasality and decreased diadochokinetic rates were noted.  Decreased intelligibility noted at the phrase level and at times the word level.  The patient achieved an overall score of 84/90 on the [place] Test (ie spelling subtest was not administered worth 10 points).  Mild deficits were noted for following instructions and reading/comprehending sentences.  Breakdown noted for increasing complexity.  He was able to follow 1 step verbal and written directions.  However, breakdown was seen for complex verbal and written 2 step directions.  Otherwise language skills appeared to be intact.  He was fully oriented to person and place but was disoriented to time and situation.  He had good immediate recall of 3 novel words, however, given a short delay he was unable to recall all three words even given max cues.  He reported normal memory skills prior to admission.  He was able to provide logical solutions to simple problems.  He reported that he was living alone PTA.  Given current presentation patient would benefit from 24x7 supervision at discharge.  He would benefit from intense post acute rehab.  ST will follow during acute stay.      SLP Assessment  SLP Recommendation/Assessment: Patient needs continued Speech Lanaguage Pathology Services SLP Visit Diagnosis: Dysarthria and anarthria (R47.1)    Follow  Up Recommendations  Inpatient Rehab    Frequency and Duration min 2x/week  2 weeks      SLP Evaluation Cognition  Overall Cognitive Status: Impaired/Different from baseline Arousal/Alertness: Awake/alert Orientation Level: Oriented to person;Oriented to place;Disoriented to time;Disoriented to situation Attention: Sustained Sustained Attention: Impaired Sustained Attention Impairment: Verbal basic Memory: Impaired Memory Impairment: Decreased recall of new information Awareness: Impaired Awareness Impairment: Intellectual impairment Problem Solving: Appears intact Safety/Judgment: Impaired       Comprehension  Auditory Comprehension Overall Auditory Comprehension: Impaired Yes/No Questions: Within Functional Limits Commands: Impaired One Step Basic Commands: 75-100% accurate Two Step Basic Commands: 0-24% accurate Conversation: Simple Reading Comprehension Reading Status: Within funtional limits    Expression Expression Primary Mode of Expression: Verbal Verbal Expression Overall Verbal Expression: Appears within functional limits for tasks assessed Initiation: No impairment Automatic Speech: Name;Social Response;Counting;Day of week Level of Generative/Spontaneous Verbalization: Sentence;Phrase Repetition: No impairment Naming: No impairment Pragmatics: No impairment Non-Verbal Means of Communication: Not applicable Written Expression Dominant Hand: Left Written Expression: Not tested   Oral / Motor  Oral Motor/Sensory Function Overall Oral Motor/Sensory Function: Moderate impairment Facial ROM: Reduced right;Suspected CN VII (facial) dysfunction Facial Symmetry: Abnormal symmetry right;Suspected CN VII (facial) dysfunction Facial Strength: Reduced right;Suspected CN VII (facial) dysfunction Facial Sensation:  Within Functional Limits Lingual ROM: Reduced right Lingual Symmetry: Abnormal symmetry right Lingual Strength: Within Functional Limits Velum:  Impaired right;Impaired left;Suspected CN X (Vagus) dysfunction Mandible: Within Functional Limits Motor Speech Overall Motor Speech: Impaired Respiration: Within functional limits Phonation: Low vocal intensity Resonance: Hypernasality Articulation: Impaired Level of Impairment: Phrase Intelligibility: Intelligibility reduced Word: 75-100% accurate Phrase: 50-74% accurate Sentence: 50-74% accurate Conversation: 50-74% accurate Motor Planning: Witnin functional limits Motor Speech Errors: Not applicable   GO                   Shelly Flatten, MA, CCC-SLP Acute Rehab SLP [phone]  Lamar Sprinkles 10/28/2019, 12:02 PM

## 2019-10-28 NOTE — Patient Care Conference (Signed)
Called and updated contact at number listed. All questions answered.

## 2019-10-28 NOTE — Progress Notes (Addendum)
Inpatient Rehab Admissions Coordinator:   Met with patient at bedside to discuss potential CIR admission. Pt. Is aware that he needs continued rehab to regain independence. Discussed CIR with Pt. And Pt. Stated that he lived alone prior to  or sometimes with friends and does not have anyone who could provide 24/7 support and supervision following discharge. Spoke with Pt.'s friend, [name] and he would like a few days to see if he can arrange 24/7 support.   Clemens Catholic, [place], [place] Admissions Coordinator  [phone] ([place]) [phone] (office)

## 2019-10-28 NOTE — Progress Notes (Signed)
Patient verbalized that he was okay with support system [name] and Leveda Anna receiving updates about his medical care. Both numbers placed in contact list. [name] would like to be first point of contact.  Gwendolyn Grant, RN

## 2019-10-28 NOTE — Progress Notes (Signed)
PROGRESS NOTE    Jeff [name]  DGU:[id number] DOB: 1946-03-21 DOA: 10/25/2019 PCP: Holley Bouche, NP (Inactive)    Brief Narrative:  74 year old male with a history of prostate cancer, cocaine use, bilateral hydronephrosis, B12 deficiency, CKD stage III presenting with slurred speech and facial droop.  The patient is a poor historian.  Apparently he had a friend that noticed that he was having speech difficulty and facial droop and activated EMS.  Apparently this started on 10/24/2019.  The patient is unable to tell me any specific details regarding his facial droop or slurred speech other than the fact that he has noticed some speech difficulty.  The patient denies any headache, visual disturbance, focal extremity weakness, fevers, chills, chest pain, shortness breath, coughing, hemoptysis, nausea, vomiting, diarrhea, abdominal pain, dysuria.  He states that he has used cocaine within the past week.  He is unable to provide any significant details particularly with regard to timing of his neurologic deficits.  He is unable to tell me how long he has been using cocaine how often.  He smokes 1/2 pack/day.  In addition, the patient states that he drinks alcohol regularly although is unable to tell me the amount or frequency or last use of alcohol.  In the emergency department, the patient is awake and alert and follows commands.  He has been afebrile and hemodynamically stable albeit with soft blood pressures.  Oxygen saturation is 100% on room air.  CT of the brain showed a hypodensity in the left basal ganglia consistent with subacute infarct in the putamen and caudate nucleus.  BMP showed a serum creatinine of one-point 6 repeat LFTs were unremarkable.  WBC 4.2, hemoglobin 10.2, platelets 201,000.  The patient was admitted for further evaluation and treatment of his possible stroke.  EDP contacted Dr. Cheral Marker  Assessment & Plan:   Active Problems:   CVA (cerebral vascular accident) ([place])    Acute ischemic stroke ([place])   Cocaine abuse ([place])   CKD (chronic kidney disease) stage 3, GFR 30-59 ml/min   Acute metabolic encephalopathy   Prostate cancer metastatic to bone [facility])  Ischemic CVA -PT/OT recommendations for CIR noted -CT brain--hypodensity left basal ganglia consistent with subacute infarct in the putamen and caudate nucleus -MRI brain confirms acute infarct affecting the L caudate and putamen with patchy areas of involvement affecting the insular region and frontoparietal cortical and subcortical brain -Carotid Duplex without stenosis -Echo reviewed. See below. New EF of 35% ntoed -LDL 134, to continue atorvastatin 40mg  daily -HbA1C 6.3 -Antiplatelet--aspirin 81 mg daily -Neurology was consulted. BP goal to normalize to goal of <140/90 by discharge -Pending placement, possible CIR  CKD stage IIIb -Baseline creatinine 9.5-6.3  Metabolic encephalopathy -Unclear baseline -TSH of 1.083 -B12  of 1281 -Ammonia normal range -UA--no pyuria -remains slow in responses but responds appropriatelhy  Metastatic prostate cancer -Patient follows Dr. 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 Echo reviewed personally -Pt denies chest pain or sob. Currently euvolemic with no LE edema  -have discussed with Cardiology on call. Recommendation for ultimately as BP tolerates and follow up with Cardiology as outpatient. -BP currently 105/67, thus will hold off on ARB at this time -Given cocaine abuse hx, would avoid beta blocker  DVT prophylaxis: Lovenox subq Code Status: Full Family  Communication: Pt in room, family not at bedside  Status is: Inpatient  Remains inpatient appropriate  because:Unsafe d/c plan and Inpatient level of care appropriate due to severity of illness   Dispo: The patient is from: Home              Anticipated d/c is to: CIR              Anticipated d/c date is: 2 days              Patient currently is medically stable to d/c. Just awaiting placeent   Consultants:   Neurology  Procedures:     Antimicrobials: Anti-infectives (From admission, onward)   None      Subjective: No complaints at this time  Objective: Vitals:   10/28/19 0717 10/28/19 0752 10/28/19 1211 10/28/19 1604  BP: 96/68 (!) 102/57 96/76 108/64  Pulse: (!) 54 100  63  Resp: 12 17 15 13   Temp: 97.9 F (36.6 C) 97.9 F (36.6 C) 97.7 F (36.5 C) 98.5 F (36.9 C)  TempSrc: Axillary  Axillary Oral  SpO2: 97% 100% 100% 98%  Weight:      Height:        Intake/Output Summary (Last 24 hours) at 10/28/2019 1627 Last data filed at 10/28/2019 1303 Gross per 24 hour  Intake 300 ml  Output 900 ml  Net -600 ml   Filed Weights   10/25/19 1312  Weight: 76.2 kg    Examination: General exam: Awake, laying in bed, in nad Respiratory system: Normal respiratory effort, no wheezing  Data Reviewed: I have personally reviewed following labs and imaging studies  CBC: Recent Labs  Lab 10/25/19 1426 10/25/19 1441  WBC  --  4.2  NEUTROABS  --  3.0  HGB 10.9* 10.2*  HCT 32.0* 31.7*  MCV  --  92.7  PLT  --  782   Basic Metabolic Panel: Recent Labs  Lab 10/25/19 1426 10/25/19 1441 10/27/19 0415 10/28/19 0415  NA 143 138 141 139  K 4.4 4.4 4.0 3.8  CL 111 108 110 107  CO2  --  21* 21* 23  GLUCOSE 92 92 92 90  BUN 29* 28* 22 16  CREATININE 1.80* 1.63* 1.34* 1.30*  CALCIUM  --  9.2 9.3 9.1  MG  --   --   --  1.4*   GFR: Estimated Creatinine Clearance: 55.4 mL/min (A) (by C-G formula based on SCr of 1.3 mg/dL (H)). Liver Function Tests: Recent Labs  Lab 10/25/19 1441 10/28/19 0415  AST 27 23  ALT 14 14  ALKPHOS 48 43  BILITOT 0.7 0.6  PROT 8.2* 6.8   ALBUMIN 3.9 3.0*   No results for input(s): LIPASE, AMYLASE in the last 168 hours. Recent Labs  Lab 10/26/19 0738  AMMONIA 18   Coagulation Profile: Recent Labs  Lab 10/25/19 1441  INR 1.1   Cardiac Enzymes: No results for input(s): CKTOTAL, CKMB, CKMBINDEX, TROPONINI in the last 168 hours. BNP (last 3 results) No results for input(s): PROBNP in the last 8760 hours. HbA1C: No results for input(s): HGBA1C in the last 72 hours. CBG: No results for input(s): GLUCAP in the last 168 hours. Lipid Profile: Recent Labs    10/26/19 0418  CHOL 192  HDL 42  LDLCALC 134*  TRIG 81  CHOLHDL 4.6   Thyroid Function Tests: Recent Labs    10/26/19 0738  TSH 1.083  FREET4 1.11   Anemia Panel: Recent Labs    10/26/19 0738  VITAMINB12 1,281*  FOLATE 7.9   Sepsis Labs: No results for input(s): PROCALCITON, LATICACIDVEN in the last 168 hours.  Recent Results (from the past 240 hour(s))  Urine culture     Status: Abnormal   Collection Time: 10/26/19 12:44 AM   Specimen: Urine, Clean Catch  Result Value Ref Range Status   Specimen Description   Final    URINE, CLEAN CATCH Performed at [facility], [address], [place], [place] [address]    Special Requests   Final    NONE Performed at [facility], [address], [place], [place] [address]    Culture MULTIPLE SPECIES PRESENT, SUGGEST RECOLLECTION (A)  Final   Report Status 10/27/2019 FINAL  Final  SARS Coronavirus 2 by RT PCR (hospital order, performed in [facility] hospital lab) Nasopharyngeal Nasopharyngeal Swab     Status: None   Collection Time: 10/26/19 12:44 AM   Specimen: Nasopharyngeal Swab  Result Value Ref Range Status   SARS Coronavirus 2 NEGATIVE NEGATIVE Final    Comment: (NOTE) SARS-CoV-2 target nucleic acids are NOT DETECTED.  The SARS-CoV-2 RNA is generally detectable in upper and lower respiratory specimens during the acute phase of infection. The lowest concentration of SARS-CoV-2 viral copies  this assay can detect is 250 copies / mL. A negative result does not preclude SARS-CoV-2 infection and should not be used as the sole basis for treatment or other patient management decisions.  A negative result may occur with improper specimen collection / handling, submission of specimen other than nasopharyngeal swab, presence of viral mutation(s) within the areas targeted by this assay, and inadequate number of viral copies (<250 copies / mL). A negative result must be combined with clinical observations, patient history, and epidemiological information.  Fact Sheet for Patients:   StrictlyIdeas.no  Fact Sheet for Healthcare Providers: BankingDealers.co.za  This test is not yet approved or  cleared by the [place] FDA and has been authorized for detection and/or diagnosis of SARS-CoV-2 by FDA under an Emergency Use Authorization (EUA).  This EUA will remain in effect (meaning this test can be used) for the duration of the COVID-19 declaration under Section 564(b)(1) of the Act, 21 U.S.C. section 360bbb-3(b)(1), unless the authorization is terminated or revoked sooner.  Performed at [facility], [address] [place], [place] [address]      Radiology Studies: No results found.  Scheduled Meds: .  stroke: mapping our early stages of recovery book   Does not apply Once  . aspirin EC  81 mg Oral Daily  . atorvastatin  40 mg Oral Daily  . Chlorhexidine Gluconate Cloth  6 each Topical Daily  . enoxaparin (LOVENOX) injection  40 mg Subcutaneous QHS  . sodium chloride flush  10-40 mL Intracatheter Q12H   Continuous Infusions:   LOS: 3 days   Marylu Lund, MD [facility] Pager On Amion  If 7PM-7AM, please contact night-coverage 10/28/2019, 4:27 PM

## 2019-10-28 NOTE — Progress Notes (Signed)
Social work paged for request to follow up with patient support system [name] and Leveda Anna about SNF's closer to their home. Will continue to follow up.  Gwendolyn Grant, RN

## 2019-10-29 DIAGNOSIS — I639 Cerebral infarction, unspecified: Secondary | ICD-10-CM

## 2019-10-29 LAB — COMPREHENSIVE METABOLIC PANEL
ALT: 16 U/L (ref 0–44)
AST: 24 U/L (ref 15–41)
Albumin: 3 g/dL — ABNORMAL LOW (ref 3.5–5.0)
Alkaline Phosphatase: 45 U/L (ref 38–126)
Anion gap: 9 (ref 5–15)
BUN: 18 mg/dL (ref 8–23)
CO2: 24 mmol/L (ref 22–32)
Calcium: 8.9 mg/dL (ref 8.9–10.3)
Chloride: 103 mmol/L (ref 98–111)
Creatinine, Ser: 1.36 mg/dL — ABNORMAL HIGH (ref 0.61–1.24)
GFR calc Af Amer: 60 mL/min — ABNORMAL LOW (ref >60)
GFR calc non Af Amer: 52 mL/min — ABNORMAL LOW (ref >60)
Glucose, Bld: 93 mg/dL (ref 70–99)
Potassium: 4.3 mmol/L (ref 3.5–5.1)
Sodium: 136 mmol/L (ref 135–145)
Total Bilirubin: 0.4 mg/dL (ref 0.3–1.2)
Total Protein: 6.8 g/dL (ref 6.5–8.1)

## 2019-10-29 LAB — MAGNESIUM: Magnesium: 2 mg/dL (ref 1.7–2.4)

## 2019-10-29 NOTE — PMR Pre-admission (Addendum)
PMR Admission Coordinator Pre-Admission Assessment  Patient: Jeff [name] is an 74 y.o., male MRN: [id number] DOB: 04-02-1946 Height: 6' (182.9 cm) Weight: 76.2 kg              Insurance Information HMO: yes    PPO:      PCP:      IPA:      80/20:      OTHER:  PRIMARY: UHC Medicare      Policy#: [id number]      Subscriber: patient CM Name: Wells Guiles  [facility] [place]  OVFIE#:[phone]     Fax#: [phone] Pre-Cert#: [id number] navi number [id number] Approved for 7 days with f/u 7/14   Employer:   Benefits:  Phone #: online      Name: [email or web address] provider portal Eff. Date: 04/27/19 -still active     Deduct: does not have ($0)      Out of Pocket Max: $7,550 ($0 met)      Life Max: NA  CIR: $1,400/admission co-pay      SNF: $0/day co-pay for days 1-20, $180.50/day copay for days 21-100; limited to 100 days/cal yr Outpatient: 80% coverage, 20% co-insurance; limited by medical necessity      Home Health: 100% coverage, 0% co-insurance; limited by medical necessity     DME: 80% coverage, 20% co-insurance   Providers:  SECONDARY: Medicaid [place]      Policy#: [id number] O      Phone#: verified eligibility online via [employer] on 11/01/19 [facility] coverage code).   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 right-handed male with history of cocaine/alcohol/tobacco use, bilateral hydronephrosis with metastatic prostate cancer.  Per chart review patient lives with friends and independent prior to admission.  1 level  apartment.  Presented 10/25/2019 with slurred speech right side weakness and facial droop with altered mental status.  CT/MRI showed acute infarction affecting the left caudate and putamen and with patchy areas of involvement affecting the insular region and frontoparietal cortical and subcortical brain.  Mild swelling but no hemorrhage.  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  Carotid Dopplers with no significant stenosis.  Echocardiogram with ejection fraction of 35% without embolism.  Admission chemistries with alcohol negative, hemoglobin 10.2, BUN 28, creatinine 1.63, urine culture multiple species, urine drug screen positive cocaine.  Patient is currently maintained on aspirin for CVA prophylaxis.  Subcutaneous Lovenox for DVT prophylaxis.  Tolerating a regular diet.  Therapy evaluations completed with recommendations for CIR. Pt is to admit to CIR on Saturday 7/10 when bed is available.     Complete NIHSS TOTAL: 0 Glasgow Coma Scale Score: 15  Past Medical History  Past Medical History:  Diagnosis Date   Acute renal failure ([place]) 01/26/2014   Alcohol abuse    Anemia of chronic disease 01/26/2014   Bilateral hydronephrosis 01/26/2014   History of cocaine abuse ([place]) 01/26/2014   Homelessness 01/31/2014   Prostate cancer metastatic to multiple sites ([place]) 01/30/2014   Sepsis (  HCC)    UTI (lower urinary tract infection)     Family History  family history includes Cancer (age of onset: 60) in his brother.  Prior Rehab/Hospitalizations:  Has the patient had prior rehab or hospitalizations prior to admission? No  Has the patient had major surgery during 100 days prior to admission? No  Current Medications   Current Facility-Administered Medications:     stroke: mapping our early stages of recovery book, , Does not apply, Once, Emokpae, Ejiroghene E, MD   acetaminophen (TYLENOL) tablet 650 mg, 650 mg,  Oral, Q4H PRN **OR** acetaminophen (TYLENOL) 160 MG/5ML solution 650 mg, 650 mg, Per Tube, Q4H PRN **OR** acetaminophen (TYLENOL) suppository 650 mg, 650 mg, Rectal, Q4H PRN, Emokpae, Ejiroghene E, MD   aspirin EC tablet 81 mg, 81 mg, Oral, Daily, Emokpae, Ejiroghene E, MD, 81 mg at 11/02/19 1041   atorvastatin (LIPITOR) tablet 40 mg, 40 mg, Oral, Daily, Emokpae, Ejiroghene E, MD, 40 mg at 11/02/19 1041   Chlorhexidine Gluconate Cloth 2 % PADS 6 each, 6 each, Topical, Daily, Tat, Kinnie, MD, 6 each at 11/02/19 1041   enoxaparin (LOVENOX) injection 40 mg, 40 mg, Subcutaneous, QHS, Emokpae, Ejiroghene E, MD, 40 mg at 11/01/19 2108   midodrine (PROAMATINE) tablet 5 mg, 5 mg, Oral, TID WC, Amin, Ankit Chirag, MD   senna-docusate (Senokot-S) tablet 1 tablet, 1 tablet, Oral, QHS PRN, Emokpae, Ejiroghene E, MD   sodium chloride flush (NS) 0.9 % injection 10-40 mL, 10-40 mL, Intracatheter, Q12H, Tat, Marcial, MD, 10 mL at 11/02/19 1041   sodium chloride flush (NS) 0.9 % injection 10-40 mL, 10-40 mL, Intracatheter, PRN, Tat, Lofton, MD  Facility-Administered Medications Ordered in Other Encounters:    cyanocobalamin ((VITAMIN B-12)) injection 1,000 mcg, 1,000 mcg, Intramuscular, Once, Kefalas, Thomas S, PA-C  Patients Current Diet:  Diet Order             Diet regular Room service appropriate? Yes; Fluid consistency: Thin  Diet effective now                   Precautions / Restrictions Precautions Precautions: Fall Precaution Comments: R weakness, inattention Restrictions Weight Bearing Restrictions: No   Has the patient had 2 or more falls or a fall with injury in the past year?No  Prior Activity Level    Prior Functional Level Prior Function Level of Independence: Independent Comments: Lives with friends; reports independence prior  Self Care: Did the patient need help bathing, dressing, using the toilet or eating?  Independent  Indoor Mobility: Did the patient need assistance with  walking from room to room (with or without device)? Independent  Stairs: Did the patient need assistance with internal or external stairs (with or without device)? Independent  Functional Cognition: Did the patient need help planning regular tasks such as shopping or remembering to take medications? Independent  Home Assistive Devices / Equipment Home Equipment: None  Prior Device Use: Indicate devices/aids used by the patient prior to current illness, exacerbation or injury? None of the above  Current Functional Level Cognition  Arousal/Alertness: Awake/alert Overall Cognitive Status: Impaired/Different from baseline Orientation Level: Oriented X4 Following Commands: Follows one step commands consistently, Follows multi-step commands inconsistently Safety/Judgement: Decreased awareness of safety General Comments: pt continues to be very flat. mild safety impairments noted while managing RW, slightly slow to process Attention: Sustained Sustained Attention: Impaired Sustained Attention Impairment: Verbal basic Memory: Impaired Memory Impairment: Decreased recall of new information Awareness: Impaired Awareness Impairment: Intellectual impairment Problem Solving: Appears   intact Safety/Judgment: Impaired    Extremity Assessment (includes Sensation/Coordination)  Upper Extremity Assessment: Generalized weakness RUE Deficits / Details: shoulder 2/5 MM grade; elbow through digits 2 to 2+/5 mm grade RUE Sensation: decreased light touch RUE Coordination: decreased fine motor, decreased gross motor LUE Deficits / Details: AROM, WFLs. 4/5 MM grade  Lower Extremity Assessment: Defer to PT evaluation RLE Deficits / Details: weaknesss RLE Sensation: decreased light touch RLE Coordination: decreased gross motor, decreased fine motor LLE Deficits / Details: WNL LLE Sensation: WNL LLE Coordination: WNL    ADLs  Overall ADL's : Needs assistance/impaired Eating/Feeding: Set up,  Sitting Grooming: Oral care, Sitting, Standing, Min guard Grooming Details (indicate cue type and reason): min guard for balance while standing at sink with RW; pt tachy neeiding to sit down during standing ADL HR 132 bpm Upper Body Bathing: Minimal assistance, Sitting Lower Body Bathing: Maximal assistance, Sitting/lateral leans, Sit to/from stand Upper Body Dressing : Minimal assistance, Sitting Lower Body Dressing: Supervision/safety, Sitting/lateral leans Lower Body Dressing Details (indicate cue type and reason): to adjust socks from EOB Toilet Transfer: Minimal assistance, RW, Ambulation Toilet Transfer Details (indicate cue type and reason): simulated via functional mobility with RW; min A for balance and RW mgmt Toileting- Clothing Manipulation and Hygiene: Minimal assistance Toileting - Clothing Manipulation Details (indicate cue type and reason): pt completed posterior care standing with minA Functional mobility during ADLs: Minimal assistance, Rolling walker General ADL Comments: pt tachy during session limiting further ADLs or mobility tasks    Mobility  Overal bed mobility: Needs Assistance Bed Mobility: Supine to Sit Supine to sit: Supervision Sit to supine: Supervision, HOB elevated General bed mobility comments: HOB elevated, no physical assist, supervision for safety    Transfers  Overall transfer level: Needs assistance Equipment used: Rolling walker (2 wheeled) Transfers: Sit to/from Stand Sit to Stand: Min assist, Min guard General transfer comment: min guard from EOB and MIN A topowr up from chair with no arm rests    Ambulation / Gait / Stairs / Wheelchair Mobility  Ambulation/Gait Ambulation/Gait assistance: Min assist, +2 safety/equipment (for chair follow) Gait Distance (Feet): 150 Feet Assistive device: Rolling walker (2 wheeled) Gait Pattern/deviations: Step-through pattern, Decreased stride length, Decreased dorsiflexion - right, Decreased weight shift to  right General Gait Details: pt with noted R knee buckling requiring max verbal and tactile cues to extend R knee during stance phase, with onset of fatigue R knee buckling progressively worsening requiring modA to prevent fall, pt with better walker management today as well Gait velocity: dec Gait velocity interpretation: <1.31 ft/sec, indicative of household ambulator    Posture / Balance Dynamic Sitting Balance Sitting balance - Comments: attempted to don socks but required minA for bilat feet Balance Overall balance assessment: Needs assistance Sitting-balance support: Feet supported Sitting balance-Leahy Scale: Good Sitting balance - Comments: attempted to don socks but required minA for bilat feet Postural control: Posterior lean Standing balance support: During functional activity, Single extremity supported Standing balance-Leahy Scale: Poor Standing balance comment: at least one UE supported during standing ADL tasks    Special needs/care consideration  Diabetic management : no  and Designated visitor: nick     Previous Home Environment (from acute therapy documentation) Living Arrangements: Alone, Non-relatives/Friends  Lives With: Alone Available Help at Discharge: Friend(s), Available PRN/intermittently Type of Home: Apartment Home Layout: One level Home Access: Elevator Bathroom Shower/Tub: Tub/shower unit Bathroom Toilet: Standard Bathroom Accessibility: Yes How Accessible: Accessible via walker Home Care Services: No  Discharge Living   Setting Plans for Discharge Living Setting: Patient's home, Apartment Type of Home at Discharge: Apartment Discharge Home Layout: One level Discharge Home Access: Elevator Discharge Bathroom Shower/Tub: Tub/shower unit Discharge Bathroom Toilet: Standard Discharge Bathroom Accessibility: Yes How Accessible: Accessible via walker Does the patient have any problems obtaining your medications?: No  Social/Family/Support  Systems Patient Roles: Other (Comment) Contact Information: [phone] Anticipated Caregiver: Nick Nickson (friend) Anticipated Caregiver's Contact Information: [phone] Ability/Limitations of Caregiver: None  Caregiver Availability: Intermittent Discharge Plan Discussed with Primary Caregiver: Yes with pt and Nick   Goals Patient/Family Goal for Rehab: PT: Mod I; OT/SLP: supervision Expected length of stay: 6-9 days Pt/Family Agrees to Admission and willing to participate: Yes Program Orientation Provided & Reviewed with Pt/Caregiver Including Roles  & Responsibilities: Yes  Barriers to Discharge: Decreased caregiver support, Lack of/limited family support, Insurance for SNF coverage   Decrease burden of Care through IP rehab admission: NA   Possible need for SNF placement upon discharge: Not anticipated; pt has good social support from a family friend who has confirmed anticipated intermittent supervision/assist after DC from CIR for safety.    Patient Condition: This patient's medical and functional status has changed since the consult dated: 10/29/19 in which the Rehabilitation Physician determined and documented that the patient's condition is appropriate for intensive rehabilitative care in an inpatient rehabilitation facility. See "History of Present Illness" (above) for medical update. Functional changes are: overall min assist. Patient's medical and functional status update has been discussed with the Rehabilitation physician and patient remains appropriate for inpatient rehabilitation. Will admit to inpatient rehab 7/10 when bed is available.  Preadmission Screen Completed By: Kelly Gentry with updates by  Boyette, Barbara Godwin, RN,MSN 11/02/2019 11:45 AM ______________________________________________________________________   Discussed status with Dr. Patel on 11/02/2019 at 1148 and received approval for admission Saturday 11/03/2019 when bed is available.  Admission  Coordinator: Kelly Gentry with updates by  Boyette, Barbara Godwin, RN MSN time 1148 Date 11/02/2019    

## 2019-10-29 NOTE — Progress Notes (Signed)
Physical Therapy Treatment Patient Details Name: Jeff [name] MRN: [id number] DOB: [birth year] Today's Date: 10/29/2019    History of Present Illness 74yo male who began showing R sided weakness and facial droop on 6/30, brought to [place] on 7/1 and subsequently transferred to [facility] due to MRI showing acute L caudate and putamen CVA, also L M2 MCA occlusion. No tpa given, out of window. PMH acute renal failture, EtOH abuse, cocaine abuse, metastatic prostate CA, UTI, B nephrostomy    PT Comments    Pt was seen for mobility and strengthening on LE's, assistance provided for RLE as needed to carry through full AROM and assisted ROM.  Pt is motivated but not feeling up to getting OOB, and did agree to bed program.  Has been able to assist with scooting on bed, improved quality of repositioning with trendelenburg.  Follow acutely to work on RLE weakness, struggles to move and strategies to compensate his weakness.   Follow Up Recommendations  CIR     Equipment Recommendations  Rolling walker with 5" wheels;3in1 (PT);Wheelchair (measurements PT);Wheelchair cushion (measurements PT)    Recommendations for Other Services       Precautions / Restrictions Precautions Precautions: Fall Precaution Comments: R sided weakness and numbness, R inattention, impulsive Restrictions Weight Bearing Restrictions: No    Mobility  Bed Mobility Overal bed mobility: Needs Assistance Bed Mobility:  (scooting up in bed)           General bed mobility comments: scooting up in bed with bed features and min assist  Transfers                 General transfer comment: deferred  Ambulation/Gait                 Stairs             Wheelchair Mobility    Modified Rankin (Stroke Patients Only)       Balance                                            Cognition Arousal/Alertness: Awake/alert Behavior During Therapy: Flat affect Overall Cognitive  Status: Impaired/Different from baseline Area of Impairment: Following commands;Safety/judgement                       Following Commands: Follows one step commands inconsistently;Follows one step commands with increased time Safety/Judgement: Decreased awareness of deficits Awareness: Intellectual Problem Solving: Slow processing;Requires verbal cues        Exercises General Exercises - Lower Extremity Ankle Circles/Pumps: AAROM;5 reps Quad Sets: AROM;10 reps Gluteal Sets: AROM;10 reps Heel Slides: AAROM;AROM;10 reps Hip ABduction/ADduction: AROM;AAROM;10 reps Straight Leg Raises: AAROM;10 reps    General Comments        Pertinent Vitals/Pain Pain Assessment: No/denies pain    Home Living                      Prior Function            PT Goals (current goals can now be found in the care plan section) Acute Rehab PT Goals Patient Stated Goal: to get stronger    Frequency    Min 4X/week      PT Plan Current plan remains appropriate    Co-evaluation  AM-PAC PT "6 Clicks" Mobility   Outcome Measure  Help needed turning from your back to your side while in a flat bed without using bedrails?: None Help needed moving from lying on your back to sitting on the side of a flat bed without using bedrails?: A Little Help needed moving to and from a bed to a chair (including a wheelchair)?: A Lot Help needed standing up from a chair using your arms (e.g., wheelchair or bedside chair)?: A Lot Help needed to walk in hospital room?: A Lot Help needed climbing 3-5 steps with a railing? : Total 6 Click Score: 14    End of Session   Activity Tolerance: Patient limited by fatigue;Treatment limited secondary to medical complications (Comment) Patient left: in bed;with call bell/phone within reach;with bed alarm set Nurse Communication: Mobility status PT Visit Diagnosis: Unsteadiness on feet (R26.81);Other abnormalities of gait and  mobility (R26.89);Muscle weakness (generalized) (M62.81);Difficulty in walking, not elsewhere classified (R26.2);Hemiplegia and hemiparesis;Other symptoms and signs involving the nervous system (R29.898) Hemiplegia - Right/Left: Right Hemiplegia - dominant/non-dominant: Dominant Hemiplegia - caused by: Cerebral infarction     Time: 0923-3007 PT Time Calculation (min) (ACUTE ONLY): 13 min  Charges:  $Therapeutic Exercise: 8-22 mins                    Ramond Dial 10/29/2019, 4:41 PM  Mee Hives, PT MS Acute Rehab Dept. Number: [place] and [place]

## 2019-10-29 NOTE — NC FL2 (Signed)
[place] LEVEL OF CARE SCREENING TOOL     IDENTIFICATION  Patient Name: Jeff [name] Birthdate: 04-22-1946 Sex: male Admission Date (Current Location): 10/25/2019  [facility] and [place] Number:  [employer] and Address:  [facility], [place] [address], [place], [place] [address]      Provider Number: [id number]  Attending Physician Name and Address:  Donne Hazel, MD  Relative Name and Phone Number:  [name] friend, [phone]    Current Level of Care: Hospital Recommended Level of Care: [place] Prior Approval Number:    Date Approved/Denied:   PASRR Number: [id number]  Discharge Plan: SNF    Current Diagnoses: Patient Active Problem List   Diagnosis Date Noted  . Acute ischemic stroke ([place]) 10/26/2019  . Cocaine abuse ([place]) 10/26/2019  . CKD (chronic kidney disease) stage 3, GFR 30-59 ml/min 10/26/2019  . Acute metabolic encephalopathy 39/76/7341  . Prostate cancer metastatic to bone ([place]) 10/26/2019  . CVA (cerebral vascular accident) ([place]) 10/25/2019  . Port or reservoir infection   . B12 deficiency 04/01/2016  . Malnutrition of moderate degree ([place]) 05/24/2014  . Bacteremia 05/24/2014  . Pyrexia   . Sepsis ([place]) 05/23/2014  . UTI (lower urinary tract infection) 05/23/2014  . Health care maintenance 02/06/2014  . Homelessness 01/31/2014  . Malignant neoplasm of prostate ([place]) 01/30/2014  . Bone lesion 01/26/2014  . Acute renal failure ([place]) 01/26/2014  . Bilateral hydronephrosis 01/26/2014  . Anemia of chronic disease 01/26/2014  . History of cocaine abuse ([place]) 01/26/2014    Orientation RESPIRATION BLADDER Height & Weight     Self, Time, Place  Normal Incontinent, External catheter Weight: 168 lb (76.2 kg) Height:  6' (182.9 cm)  BEHAVIORAL SYMPTOMS/MOOD NEUROLOGICAL BOWEL NUTRITION STATUS      Incontinent Diet (Please see DC Summary)  AMBULATORY STATUS COMMUNICATION OF NEEDS Skin    Limited Assist Verbally Normal                       Personal Care Assistance Level of Assistance  Bathing, Feeding, Dressing Bathing Assistance: Limited assistance Feeding assistance: Independent Dressing Assistance: Limited assistance     Functional Limitations Info  Sight, Hearing, Speech Sight Info: Adequate Hearing Info: Adequate Speech Info: Adequate    SPECIAL CARE FACTORS FREQUENCY  PT (By licensed PT), OT (By licensed OT)     PT Frequency: 5x/week OT Frequency: 5x/week            Contractures Contractures Info: Not present    Additional Factors Info  Code Status, Allergies Code Status Info: Full Allergies Info: NKA           Current Medications (10/29/2019):  This is the current hospital active medication list Current Facility-Administered Medications  Medication Dose Route Frequency Provider Last Rate Last Admin  .  stroke: mapping our early stages of recovery book   Does not apply Once Emokpae, Ejiroghene E, MD      . acetaminophen (TYLENOL) tablet 650 mg  650 mg Oral Q4H PRN Emokpae, Ejiroghene E, MD       Or  . acetaminophen (TYLENOL) 160 MG/5ML solution 650 mg  650 mg Per Tube Q4H PRN Emokpae, Ejiroghene E, MD       Or  . acetaminophen (TYLENOL) suppository 650 mg  650 mg Rectal Q4H PRN Emokpae, Ejiroghene E, MD      . aspirin EC tablet 81 mg  81 mg Oral Daily Emokpae, Ejiroghene E,  MD   81 mg at 10/29/19 0917  . atorvastatin (LIPITOR) tablet 40 mg  40 mg Oral Daily Emokpae, Ejiroghene E, MD   40 mg at 10/29/19 0917  . Chlorhexidine Gluconate Cloth 2 % PADS 6 each  6 each Topical Daily Tat, Caulin, MD   6 each at 10/28/19 [phone]  . enoxaparin (LOVENOX) injection 40 mg  40 mg Subcutaneous QHS Emokpae, Ejiroghene E, MD   40 mg at 10/28/19 2045  . senna-docusate (Senokot-S) tablet 1 tablet  1 tablet Oral QHS PRN Emokpae, Ejiroghene E, MD      . sodium chloride flush (NS) 0.9 % injection 10-40 mL  10-40 mL Intracatheter Therisa Doyne, MD   10 mL at  10/29/19 0918  . sodium chloride flush (NS) 0.9 % injection 10-40 mL  10-40 mL Intracatheter PRN Tat, Shanon Brow, MD       Facility-Administered Medications Ordered in Other Encounters  Medication Dose Route Frequency Provider Last Rate Last Admin  . cyanocobalamin ((VITAMIN B-12)) injection 1,000 mcg  1,000 mcg Intramuscular Once Baird Cancer, PA-C         Discharge Medications: Please see discharge summary for a list of discharge medications.  Relevant Imaging Results:  Relevant Lab Results:   Additional Information SSN: [id number]  [place], [place]

## 2019-10-29 NOTE — Progress Notes (Signed)
Inpatient Rehabilitation-Admissions Coordinator   Met with pt bedside as follow up from PM&R MD consult. (please see formal note by Dr. Adam Phenix for details). Discussed recommended rehab program, expectations, anticipated LOS and functional gains. Pt is interested in this program if his insurance approves. With his permission, I spoke with his friend [name] who confirmed intermittent supervision/assist as needed. I will begin insurance authorization process for possible admit tomorrow, once insurance company is open. Please call if questions.   Raechel Ache, OTR/L  Rehab Admissions Coordinator  [phone] 10/29/2019 1:47 PM

## 2019-10-29 NOTE — Consult Note (Signed)
Physical Medicine and Rehabilitation Consult Reason for Consult: Slurred speech with facial droop Referring Physician: [facility]   HPI: Jeff [name] is a 74 y.o. right-handed male with history of cocaine/alcohol/tobacco use, bilateral hydronephrosis with metastatic prostate cancer.  Per chart review patient lives with friends and independent prior to admission.  1 level apartment.  Presented 10/25/2019 with slurred speech right side weakness and facial droop with altered mental status.  CT/MRI showed acute infarction affecting the left caudate and putamen and with patchy areas of involvement affecting the insular region and frontoparietal cortical and subcortical brain.  Mild swelling but no hemorrhage.  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  Carotid Dopplers with no significant stenosis.  Echocardiogram with ejection fraction of 35% without embolism.  Admission chemistries with alcohol negative, hemoglobin 10.2, BUN 28, creatinine 1.63, urine culture multiple species, urine drug screen positive cocaine.  Patient is currently maintained on aspirin for CVA prophylaxis.  Subcutaneous Lovenox for DVT prophylaxis.  Tolerating a regular diet.  Therapy evaluations completed with recommendations of physical medicine rehab consult.   Review of Systems  Constitutional: Negative for chills and fever.  HENT: Negative for hearing loss.   Eyes: Negative for blurred vision and double vision.  Respiratory: Negative for shortness of breath.   Cardiovascular: Negative for chest pain and palpitations.  Gastrointestinal: Positive for constipation. Negative for heartburn, nausea and vomiting.  Genitourinary: Negative for dysuria and flank pain.  Musculoskeletal: Positive for myalgias.  Skin: Negative for rash.  Neurological: Positive for speech change and weakness.  All other systems reviewed and are negative.  Past  Medical History:  Diagnosis Date  . Acute renal failure ([place]) 01/26/2014  . Alcohol abuse   . Anemia of chronic disease 01/26/2014  . Bilateral hydronephrosis 01/26/2014  . History of cocaine abuse ([place]) 01/26/2014  . Homelessness 01/31/2014  . Prostate cancer metastatic to multiple sites ([place]) 01/30/2014  . Sepsis ([place])   . UTI (lower urinary tract infection)    Past Surgical History:  Procedure Laterality Date  . ORCHIECTOMY Bilateral 02/05/2014   Procedure: BILATERAL ORCHIECTOMY;  Surgeon: Festus Aloe, MD;  Location: [facility];  Service: Urology;  Laterality: Bilateral;  . PERCUTANEOUS NEPHROSTOMY Bilateral    IR Dr. Junious Silk changed on 04/22/2014  . PORT-A-CATH REMOVAL Right 08/08/2017   Procedure: REMOVAL PORT-A-CATH RIGHT CHEST (PROCEDURE #2);  Surgeon: Aviva Signs, MD;  Location: [facility];  Service: General;  Laterality: Right;  . PORTACATH PLACEMENT Right 03/12/14  . PORTACATH PLACEMENT Left 08/08/2017   Procedure: INSERTION POWER PORT WITH ATTACHED CATHETER LEFT SUBCLAVIAN (PROCEDURE #1);  Surgeon: Aviva Signs, MD;  Location: [facility];  Service: General;  Laterality: Left;   Family History  Problem Relation Age of Onset  . Cancer Brother 71   Social History:  reports that he has been smoking cigarettes. He has a 13.75 pack-year smoking history. He has never used smokeless tobacco. He reports current alcohol use of about 1.0 standard drink of alcohol per week. He reports current drug use. Drug: Cocaine. Allergies: No Known Allergies Medications Prior to Admission  Medication Sig Dispense Refill  . calcium gluconate 500 MG tablet Take 1 tablet by mouth 2 (two) times daily.    . prochlorperazine (COMPAZINE) 10 MG tablet The day after chemo take 1 tab four times a day x 2 days. Then may take 1 tab four times a day if needed for nausea/vomiting. [place]  tablet 2    Home: Home Living Family/patient expects to be discharged to:: Private residence Living Arrangements: Alone,  Non-relatives/Friends Available Help at Discharge: Friend(s), Available PRN/intermittently Type of Home: Apartment Home Access: Elevator Home Layout: One level Bathroom Shower/Tub: Chiropodist: Standard Bathroom Accessibility: Yes Home Equipment: None  Lives With: Alone  Functional History: Prior Function Level of Independence: Independent Comments: Lives with friends; reports independence prior Functional Status:  Mobility: Bed Mobility Overal bed mobility: Needs Assistance Bed Mobility: Supine to Sit, Sit to Supine Supine to sit: Supervision, HOB elevated Sit to supine: Supervision, HOB elevated General bed mobility comments: S for safety and line management Transfers Overall transfer level: Needs assistance Equipment used: Rolling walker (2 wheeled) Transfers: Sit to/from Stand Sit to Stand: Mod assist General transfer comment: Pt modA overall for power up and for initial standing balance Ambulation/Gait Ambulation/Gait assistance: Mod assist Gait Distance (Feet): 20 Feet Assistive device: Rolling walker (2 wheeled) Gait Pattern/deviations: Step-to pattern, Decreased step length - left, Decreased stance time - right, Decreased dorsiflexion - right, Decreased weight shift to right, Drifts right/left, Trunk flexed, Wide base of support General Gait Details: very wide BOS with poor weight shifting R, kept trying to push RW far too far away from him, unsteady to R, and with poor attention on R side with max cues to avoid running into obstacles Gait velocity: decreased    ADL: ADL Overall ADL's : Needs assistance/impaired Eating/Feeding: Set up, Sitting Grooming: Minimal assistance, Standing Grooming Details (indicate cue type and reason): standing at sink x5 mins for light grooming; assist with opening toothbrush plastic; able to open and close toothpaste tube top Upper Body Bathing: Minimal assistance, Sitting Lower Body Bathing: Maximal assistance,  Sitting/lateral leans, Sit to/from stand Upper Body Dressing : Minimal assistance, Sitting Lower Body Dressing: Maximal assistance, Sitting/lateral leans, Sit to/from stand, Cueing for safety Lower Body Dressing Details (indicate cue type and reason): MaxA overall for LB dressing; RUE unable to grip sock Toilet Transfer: Moderate assistance, Ambulation, Cueing for safety, Cueing for sequencing Toileting- Clothing Manipulation and Hygiene: Maximal assistance, Cueing for safety, Cueing for sequencing, Sitting/lateral lean, Sit to/from stand Functional mobility during ADLs: Moderate assistance, Cueing for safety, Cueing for sequencing, Rolling walker General ADL Comments: Pt limited by decreased cognition, decreased expressive deficits, decreased mobility and decreased ability to care for self.  Cognition: Cognition Overall Cognitive Status: Impaired/Different from baseline Arousal/Alertness: Awake/alert Orientation Level: Oriented to person, Oriented to place, Disoriented to time, Disoriented to situation Attention: Sustained Sustained Attention: Impaired Sustained Attention Impairment: Verbal basic Memory: Impaired Memory Impairment: Decreased recall of new information Awareness: Impaired Awareness Impairment: Intellectual impairment Problem Solving: Appears intact Safety/Judgment: Impaired Cognition Arousal/Alertness: Awake/alert Behavior During Therapy: Flat affect Overall Cognitive Status: Impaired/Different from baseline Area of Impairment: Safety/judgement, Awareness, Problem solving Safety/Judgement: Decreased awareness of safety, Decreased awareness of deficits Awareness: Intellectual Problem Solving: Slow processing, Decreased initiation, Requires verbal cues, Requires tactile cues General Comments: Pt with expressive difficulties s/p CVA. Pt following 1 step commands consistently. Pt requires cues to use RW and perform step to gait pattern safely.   Blood pressure 94/62,  pulse 70, temperature 98 F (36.7 C), temperature source Oral, resp. rate 14, height 6' (1.829 m), weight 76.2 kg, SpO2 100 %. Physical Exam  General: Alert and oriented x 3, No apparent distress HEENT: Head is normocephalic, atraumatic, PERRLA, EOMI, sclera anicteric, oral mucosa pink and moist, dentition intact, ext ear canals clear,  Neck: Supple without JVD or lymphadenopathy Heart: Reg  rate and rhythm. No murmurs rubs or gallops Chest: CTA bilaterally without wheezes, rales, or rhonchi; no distress Abdomen: Soft, non-tender, non-distended, bowel sounds positive. Extremities: No clubbing, cyanosis, or edema. Pulses are 2+ Skin: Clean and intact without signs of breakdown Neuro: Patient is alert in no acute distress.  Makes eye contact with examiner.  Speech is dysarthric but intelligible.  Right favial droop. Provides his name and age but needed some cues for year.  Follows simple commands. 5/5 strength on left, 4/5 on right Psych: Pt's affect is appropriate. Pt is cooperative    No results found for this or any previous visit (from the past 24 hour(s)). No results found.   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 Does the need for close, 24 hr/day medical supervision in concert with the patient's rehab needs make it unreasonable for this patient to be served in a less intensive setting? Yes 2. Co-Morbidities requiring supervision/potential complications: cocaine abuse, prostate cancer, CKD, chronic anemia, tobacco use, ETOH abuse, dysarthria, right hemiparesis 3. Due to bladder management, bowel management, safety, skin/wound care, disease management, medication administration, pain management and patient education, does the patient require 24 hr/day rehab nursing? Yes 4. Does the patient require coordinated care of a physician, rehab nurse, therapy disciplines of PT, OT, SLP to address physical and functional deficits in the context of the above  medical diagnosis(es)? Yes Addressing deficits in the following areas: balance, endurance, locomotion, strength, transferring, bowel/bladder control, bathing, dressing, feeding, grooming, toileting, cognition, speech and psychosocial support 5. Can the patient actively participate in an intensive therapy program of at least 3 hrs of therapy per day at least 5 days per week? Yes 6. The potential for patient to make measurable gains while on inpatient rehab is good 7. Anticipated functional outcomes upon discharge from inpatient rehab are modified independent  with PT, supervision with OT, supervision with SLP. 8. Estimated rehab length of stay to reach the above functional goals is: 10-14 days 9. Anticipated discharge destination: Home 10. Overall Rehab/Functional Prognosis: good  RECOMMENDATIONS: This patient's condition is appropriate for continued rehabilitative care in the following setting: CIR Patient has agreed to participate in recommended program. Yes Note that insurance prior authorization may be required for reimbursement for recommended care.  Comment: [name] would be a good CIR candidate once medically stable (BP currently with 10G systolic). He has no family support and will likely require intermittent supervision. His friend Jeff [name] [phone]) has been visiting patient and can help to provide intermittent supervision.   Lavon Paganini Angiulli, PA-C 10/29/2019   I have personally performed a face to face diagnostic evaluation, including, but not limited to relevant history and physical exam findings, of this patient and developed relevant assessment and plan.  Additionally, I have reviewed and concur with the physician assistant's documentation above.  Leeroy Cha, MD

## 2019-10-29 NOTE — Progress Notes (Signed)
PROGRESS NOTE    Jeff [name]  GGY:[id number] DOB: October 02, 1945 DOA: 10/25/2019 PCP: Holley Bouche, NP (Inactive)    Brief Narrative:  74 year old male with a history of prostate cancer, cocaine use, bilateral hydronephrosis, B12 deficiency, CKD stage III presenting with slurred speech and facial droop.  The patient is a poor historian.  Apparently he had a friend that noticed that he was having speech difficulty and facial droop and activated EMS.  Apparently this started on 10/24/2019.  The patient is unable to tell me any specific details regarding his facial droop or slurred speech other than the fact that he has noticed some speech difficulty.  The patient denies any headache, visual disturbance, focal extremity weakness, fevers, chills, chest pain, shortness breath, coughing, hemoptysis, nausea, vomiting, diarrhea, abdominal pain, dysuria.  He states that he has used cocaine within the past week.  He is unable to provide any significant details particularly with regard to timing of his neurologic deficits.  He is unable to tell me how long he has been using cocaine how often.  He smokes 1/2 pack/day.  In addition, the patient states that he drinks alcohol regularly although is unable to tell me the amount or frequency or last use of alcohol.  In the emergency department, the patient is awake and alert and follows commands.  He has been afebrile and hemodynamically stable albeit with soft blood pressures.  Oxygen saturation is 100% on room air.  CT of the brain showed a hypodensity in the left basal ganglia consistent with subacute infarct in the putamen and caudate nucleus.  BMP showed a serum creatinine of one-point 6 repeat LFTs were unremarkable.  WBC 4.2, hemoglobin 10.2, platelets 201,000.  The patient was admitted for further evaluation and treatment of his possible stroke.  EDP contacted Dr. Cheral Marker  Assessment & Plan:   Active Problems:   CVA (cerebral vascular accident) ([place])    Acute ischemic stroke ([place])   Cocaine abuse ([place])   CKD (chronic kidney disease) stage 3, GFR 30-59 ml/min   Acute metabolic encephalopathy   Prostate cancer metastatic to bone [facility])  Ischemic CVA -PT/OT recommendations for CIR noted -CT brain--hypodensity left basal ganglia consistent with subacute infarct in the putamen and caudate nucleus -MRI brain confirms acute infarct affecting the L caudate and putamen with patchy areas of involvement affecting the insular region and frontoparietal cortical and subcortical brain -Carotid Duplex without stenosis -Echo reviewed. See below. New EF of 35% ntoed -LDL 134, to continue atorvastatin 40mg  daily -HbA1C 6.3 -Antiplatelet--aspirin 81 mg daily -Neurology was consulted. BP goal to normalize to goal of <140/90 by discharge -Awaiting placement  CKD stage IIIb -Baseline creatinine 0.3-5.0  Metabolic encephalopathy -Unclear baseline -TSH of 1.083 -B12  of 1281 -Ammonia normal range -UA--no pyuria -remains slow in responses but responds appropriatelhy  Metastatic prostate cancer -Patient follows Dr. 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 Echo reviewed personally -Pt denies chest pain or sob. Currently euvolemic with no LE edema  -have discussed with Cardiology on call. Recommendation for ultimately as BP tolerates and follow up with Cardiology as outpatient. -BP currently 105/67, thus will hold off on ARB at this time -Given cocaine abuse hx, would avoid beta blocker -Cessation has been done at bedside on a daily basis  DVT prophylaxis: Lovenox subq Code Status: Full Family Communication: Pt in room, family not at bedside  Status is:  Inpatient  Remains inpatient appropriate because:Unsafe d/c plan and Inpatient level of care appropriate due to severity of illness   Dispo: The patient is from: Home              Anticipated d/c is to: CIR              Anticipated d/c date is: 2 days              Patient currently is medically stable to d/c. Just awaiting placeent   Consultants:   Neurology  Procedures:     Antimicrobials: Anti-infectives (From admission, onward)   None      Subjective: Without complaints  Objective: Vitals:   10/29/19 0411 10/29/19 0931 10/29/19 1211 10/29/19 1639  BP: 94/62 (!) 122/47 (!) 143/71 102/66  Pulse: 70 78 66 62  Resp: 14 17 15 15   Temp: 98 F (36.7 C) 97.9 F (36.6 C) 97.8 F (36.6 C) 97.9 F (36.6 C)  TempSrc: Oral Oral Oral Oral  SpO2: 100% 95% 97% 95%  Weight:      Height:        Intake/Output Summary (Last 24 hours) at 10/29/2019 1742 Last data filed at 10/29/2019 1700 Gross per 24 hour  Intake 717 ml  Output 1500 ml  Net -783 ml   Filed Weights   10/25/19 1312  Weight: 76.2 kg    Examination: General exam: Conversant, in no acute distress Respiratory system: normal chest rise, clear, no audible wheezing  Data Reviewed: I have personally reviewed following labs and imaging studies  CBC: Recent Labs  Lab 10/25/19 1426 10/25/19 1441  WBC  --  4.2  NEUTROABS  --  3.0  HGB 10.9* 10.2*  HCT 32.0* 31.7*  MCV  --  92.7  PLT  --  016   Basic Metabolic Panel: Recent Labs  Lab 10/25/19 1426 10/25/19 1441 10/27/19 0415 10/28/19 0415 10/29/19 0530  NA 143 138 141 139 136  K 4.4 4.4 4.0 3.8 4.3  CL 111 108 110 107 103  CO2  --  21* 21* 23 24  GLUCOSE 92 92 92 90 93  BUN 29* 28* 22 16 18   CREATININE 1.80* 1.63* 1.34* 1.30* 1.36*  CALCIUM  --  9.2 9.3 9.1 8.9  MG  --   --   --  1.4* 2.0   GFR: Estimated Creatinine Clearance: 52.9 mL/min (A) (by C-G formula based on SCr of 1.36 mg/dL (H)). Liver Function Tests: Recent Labs  Lab  10/25/19 1441 10/28/19 0415 10/29/19 0530  AST 27 23 24   ALT 14 14 16   ALKPHOS 48 43 45  BILITOT 0.7 0.6 0.4  PROT 8.2* 6.8 6.8  ALBUMIN 3.9 3.0* 3.0*   No results for input(s): LIPASE, AMYLASE in the last 168 hours. Recent Labs  Lab 10/26/19 0738  AMMONIA 18   Coagulation Profile: Recent Labs  Lab 10/25/19 1441  INR 1.1   Cardiac Enzymes: No results for input(s): CKTOTAL, CKMB, CKMBINDEX, TROPONINI in the last 168 hours. BNP (last 3 results) No results for input(s): PROBNP in the last 8760 hours. HbA1C: No results for input(s): HGBA1C in the last 72 hours. CBG: No results for input(s): GLUCAP in the last 168 hours. Lipid Profile: No results for input(s): CHOL, HDL, LDLCALC, TRIG, CHOLHDL, LDLDIRECT in the last 72 hours. Thyroid Function Tests: No results for input(s): TSH, T4TOTAL, FREET4, T3FREE,  THYROIDAB in the last 72 hours. Anemia Panel: No results for input(s): VITAMINB12, FOLATE, FERRITIN, TIBC, IRON, RETICCTPCT in the last 72 hours. Sepsis Labs: No results for input(s): PROCALCITON, LATICACIDVEN in the last 168 hours.  Recent Results (from the past 240 hour(s))  Urine culture     Status: Abnormal   Collection Time: 10/26/19 12:44 AM   Specimen: Urine, Clean Catch  Result Value Ref Range Status   Specimen Description   Final    URINE, CLEAN CATCH Performed at [facility], [address] [place], [place] [address]    Special Requests   Final    NONE Performed at [facility], [address], [place], [place] [address]    Culture MULTIPLE SPECIES PRESENT, SUGGEST RECOLLECTION (A)  Final   Report Status 10/27/2019 FINAL  Final  SARS Coronavirus 2 by RT PCR (hospital order, performed in [facility] hospital lab) Nasopharyngeal Nasopharyngeal Swab     Status: None   Collection Time: 10/26/19 12:44 AM   Specimen: Nasopharyngeal Swab  Result Value Ref Range Status   SARS Coronavirus 2 NEGATIVE NEGATIVE Final    Comment: (NOTE) SARS-CoV-2 target nucleic  acids are NOT DETECTED.  The SARS-CoV-2 RNA is generally detectable in upper and lower respiratory specimens during the acute phase of infection. The lowest concentration of SARS-CoV-2 viral copies this assay can detect is 250 copies / mL. A negative result does not preclude SARS-CoV-2 infection and should not be used as the sole basis for treatment or other patient management decisions.  A negative result may occur with improper specimen collection / handling, submission of specimen other than nasopharyngeal swab, presence of viral mutation(s) within the areas targeted by this assay, and inadequate number of viral copies (<250 copies / mL). A negative result must be combined with clinical observations, patient history, and epidemiological information.  Fact Sheet for Patients:   StrictlyIdeas.no  Fact Sheet for Healthcare Providers: BankingDealers.co.za  This test is not yet approved or  cleared by the [place] FDA and has been authorized for detection and/or diagnosis of SARS-CoV-2 by FDA under an Emergency Use Authorization (EUA).  This EUA will remain in effect (meaning this test can be used) for the duration of the COVID-19 declaration under Section 564(b)(1) of the Act, 21 U.S.C. section 360bbb-3(b)(1), unless the authorization is terminated or revoked sooner.  Performed at [facility], [address], [place], [place] [address]      Radiology Studies: No results found.  Scheduled Meds: .  stroke: mapping our early stages of recovery book   Does not apply Once  . aspirin EC  81 mg Oral Daily  . atorvastatin  40 mg Oral Daily  . Chlorhexidine Gluconate Cloth  6 each Topical Daily  . enoxaparin (LOVENOX) injection  40 mg Subcutaneous QHS  . sodium chloride flush  10-40 mL Intracatheter Q12H   Continuous Infusions:   LOS: 4 days   Marylu Lund, MD [facility] Pager On Amion  If 7PM-7AM, please contact  night-coverage 10/29/2019, 5:42 PM

## 2019-10-30 LAB — BASIC METABOLIC PANEL
Anion gap: 8 (ref 5–15)
BUN: 16 mg/dL (ref 8–23)
CO2: 23 mmol/L (ref 22–32)
Calcium: 8.6 mg/dL — ABNORMAL LOW (ref 8.9–10.3)
Chloride: 102 mmol/L (ref 98–111)
Creatinine, Ser: 1.19 mg/dL (ref 0.61–1.24)
GFR calc Af Amer: >60 mL/min (ref >60)
GFR calc non Af Amer: >60 mL/min (ref >60)
Glucose, Bld: 88 mg/dL (ref 70–99)
Potassium: 4.3 mmol/L (ref 3.5–5.1)
Sodium: 133 mmol/L — ABNORMAL LOW (ref 135–145)

## 2019-10-30 LAB — CORTISOL: Cortisol, Plasma: 9.4 ug/dL

## 2019-10-30 MED ORDER — MIDODRINE HCL 5 MG PO TABS
2.5000 mg | ORAL_TABLET | Freq: Three times a day (TID) | ORAL | Status: DC
  Administered 2019-10-30 – 2019-11-02 (×8): 2.5 mg via ORAL
  Filled 2019-10-30 (×8): qty 1

## 2019-10-30 NOTE — Progress Notes (Signed)
Occupational Therapy Treatment Patient Details Name: Jeff [name] MRN: [id number] DOB: 29-Jan-1946 Today's Date: 10/30/2019    History of present illness 74yo male who began showing R sided weakness and facial droop on 6/30, brought to [facility] on 7/1 and subsequently transferred to [facility] due to MRI showing acute L caudate and putamen CVA, also L M2 MCA occlusion. No tpa given, out of window. PMH acute renal failture, EtOH abuse, cocaine abuse, metastatic prostate CA, UTI, B nephrostomy   OT comments  Pt requiring supervision for bed mobility. He donned socks sitting EOB with minguard, pt required increased time and effort due to RUE weakness. Pt demonstrated improvement with attending to R side during LB dressing and grooming at sink level. Pt required minA for standing at sink level to complete oral care. Hr up to 122bpm while standing at sink level, pt required seated rest break for 2 min returning HR to low 100s bpm. Pt located items in R and L fields of vision. Pt will continue to benefit from skilled OT services to maximize safety and independence with ADL/IADL and functional mobility. Will continue to follow acutely and progress as tolerated.     Follow Up Recommendations  CIR    Equipment Recommendations  Other (comment) (defer to next facility)    Recommendations for Other Services Rehab consult    Precautions / Restrictions Precautions Precautions: Fall Precaution Comments: R sided weakness and numbness, R inattention, impulsive Restrictions Weight Bearing Restrictions: No       Mobility Bed Mobility Overal bed mobility: Needs Assistance Bed Mobility: Supine to Sit;Sit to Supine     Supine to sit: Supervision;HOB elevated Sit to supine: Supervision;HOB elevated   General bed mobility comments: supervision for safety  Transfers Overall transfer level: Needs assistance Equipment used: Rolling walker (2 wheeled) Transfers: Sit to/from Stand Sit to Stand: Mod  assist         General transfer comment: modA to powerup into standing;vc for safe hand placement    Balance Overall balance assessment: Needs assistance Sitting-balance support: No upper extremity supported;Feet unsupported Sitting balance-Leahy Scale: Good Sitting balance - Comments: brought BLE to EOB to don socks, able to return to upright posture    Standing balance support: Single extremity supported;During functional activity Standing balance-Leahy Scale: Poor Standing balance comment: minA for stability standing at sink level during ADL completion                           ADL either performed or assessed with clinical judgement   ADL Overall ADL's : Needs assistance/impaired     Grooming: Minimal assistance;Standing Grooming Details (indicate cue type and reason): minA for support standing at sink level;pt required seated rest break during oral care due to HR 122bpm             Lower Body Dressing: Min guard;Sit to/from stand Lower Body Dressing Details (indicate cue type and reason): pt donned socks sitting EOB, minguard for stabily with sitting balance, pt demonstrated difficulty with RUE strength Toilet Transfer: Minimal assistance;RW;Ambulation Toilet Transfer Details (indicate cue type and reason): simulated in room mobility, sat on chair at sink level Toileting- Clothing Manipulation and Hygiene: Minimal assistance Toileting - Clothing Manipulation Details (indicate cue type and reason): pt completed posterior care standing with minA     Functional mobility during ADLs: Cueing for safety;Cueing for sequencing;Rolling walker;Minimal assistance General ADL Comments: pt requires cues for safety and sequencing, pt with decreased activity tolerance requiring  seated rest break at sink level;HR up to 122 with standing and grooming     Vision   Vision Assessment?: Yes Eye Alignment: Within Functional Limits Ocular Range of Motion: Within Functional  Limits Alignment/Gaze Preference: Within Defined Limits Tracking/Visual Pursuits: Able to track stimulus in all quads without difficulty Saccades: Within functional limits Additional Comments: WFL for ADL tasks;able to locate items on right and left of visual field   Perception     Praxis      Cognition Arousal/Alertness: Awake/alert Behavior During Therapy: Flat affect Overall Cognitive Status: Impaired/Different from baseline Area of Impairment: Following commands;Safety/judgement                       Following Commands: Follows one step commands inconsistently;Follows one step commands with increased time Safety/Judgement: Decreased awareness of deficits Awareness: Intellectual Problem Solving: Slow processing;Requires verbal cues General Comments: pt with expressive difficulties, requires vc for multistep ADL tasks and cues for safety awareness;        Exercises     Shoulder Instructions       General Comments HR up to 122 during grooming, seated rest break returned to low 100s    Pertinent Vitals/ Pain       Pain Assessment: No/denies pain  Home Living                                          Prior Functioning/Environment              Frequency  Min 2X/week        Progress Toward Goals  OT Goals(current goals can now be found in the care plan section)  Progress towards OT goals: Progressing toward goals  Acute Rehab OT Goals Patient Stated Goal: to get stronger OT Goal Formulation: With patient Time For Goal Achievement: 11/10/19 Potential to Achieve Goals: Good ADL Goals Pt Will Perform Grooming: with min guard assist;standing Pt Will Perform Lower Body Dressing: with min assist;sitting/lateral leans;sit to/from stand Pt Will Transfer to Toilet: with min guard assist;ambulating Pt/caregiver will Perform Home Exercise Program: Increased strength;Left upper extremity;With written HEP provided;With minimal  assist Additional ADL Goal #1: Pt will follow (2) multi step commands in a minimally distracting environment with minimal cues to attend to task.  Plan Discharge plan remains appropriate    Co-evaluation                 AM-PAC OT "6 Clicks" Daily Activity     Outcome Measure   Help from another person eating meals?: None Help from another person taking care of personal grooming?: A Little Help from another person toileting, which includes using toliet, bedpan, or urinal?: A Lot Help from another person bathing (including washing, rinsing, drying)?: A Lot Help from another person to put on and taking off regular upper body clothing?: A Little Help from another person to put on and taking off regular lower body clothing?: A Lot 6 Click Score: 16    End of Session Equipment Utilized During Treatment: Gait belt;Rolling walker  OT Visit Diagnosis: Unsteadiness on feet (R26.81);Muscle weakness (generalized) (M62.81);Other symptoms and signs involving cognitive function;Hemiplegia and hemiparesis Hemiplegia - Right/Left: Right Hemiplegia - dominant/non-dominant: Non-Dominant Hemiplegia - caused by: Cerebral infarction   Activity Tolerance Patient tolerated treatment well   Patient Left in bed;with call bell/phone within reach;with bed alarm set   Nurse Communication  Mobility status        Time: 1580-6386 OT Time Calculation (min): 24 min  Charges: OT General Charges $OT Visit: 1 Visit OT Treatments $Self Care/Home Management : 23-37 mins  Helene Kelp OTR/L [place] Office: [phone]    Wyn Forster 10/30/2019, 3:02 PM

## 2019-10-30 NOTE — Progress Notes (Signed)
Physical Therapy Treatment Patient Details Name: Jeff [name] MRN: [id number] DOB: June 16, 1945 Today's Date: 10/30/2019    History of Present Illness 74yo male who began showing R sided weakness and facial droop on 6/30, brought to  on 7/1 and subsequently transferred to [facility] due to MRI showing acute L caudate and putamen CVA, also L M2 MCA occlusion. No tpa given, out of window. PMH acute renal failture, EtOH abuse, cocaine abuse, metastatic prostate CA, UTI, B nephrostomy    PT Comments    Pt was able to walk today, better able to navigate on [name] with PT assistance.  He is weaker on R side, but is motivated to maneuver [name], reminders for safe limits needed.  He tends to lack awareness of R side with resulting inattention to impending weakness.  Did respond affirmatively when asked if he needed to sit down.  Follow acutely toward CIR placement.   Follow Up Recommendations  CIR     Equipment Recommendations  Rolling [name] with 5" wheels;3in1 (PT);Wheelchair (measurements PT);Wheelchair cushion (measurements PT)    Recommendations for Other Services       Precautions / Restrictions Precautions Precautions: Fall Precaution Comments: R weakness, inattention Restrictions Weight Bearing Restrictions: No    Mobility  Bed Mobility Overal bed mobility: Needs Assistance Bed Mobility: Supine to Sit     Supine to sit: Supervision        Transfers Overall transfer level: Needs assistance Equipment used: Rolling [name] (2 wheeled) Transfers: Sit to/from Stand Sit to Stand: Min assist            Ambulation/Gait Ambulation/Gait assistance: Min assist;Mod assist Gait Distance (Feet): 44 Feet Assistive device: Rolling [name] (2 wheeled) Gait Pattern/deviations: Step-to pattern;Decreased stance time - right Gait velocity: decreased Gait velocity interpretation: <1.31 ft/sec, indicative of household ambulator General Gait Details: wide base with help to manage  weakness of LE's as he fatigued in room walking   Stairs             Wheelchair Mobility    Modified Rankin (Stroke Patients Only) Modified Rankin (Stroke Patients Only) Pre-Morbid Rankin Score: No symptoms Modified Rankin: Moderate disability     Balance Overall balance assessment: Needs assistance Sitting-balance support: Feet supported Sitting balance-Leahy Scale: Good     Standing balance support: Bilateral upper extremity supported;During functional activity Standing balance-Leahy Scale: Poor                              Cognition Arousal/Alertness: Awake/alert Behavior During Therapy: Flat affect Overall Cognitive Status: Impaired/Different from baseline Area of Impairment: Awareness;Safety/judgement                       Following Commands: Follows one step commands with increased time;Follows one step commands inconsistently Safety/Judgement: Decreased awareness of deficits;Decreased awareness of safety            Exercises General Exercises - Lower Extremity Ankle Circles/Pumps: AAROM;5 reps Quad Sets: AROM;10 reps Gluteal Sets: AROM;10 reps Heel Slides: AROM;10 reps Hip ABduction/ADduction: AROM;10 reps Straight Leg Raises: AAROM;10 reps    General Comments General comments (skin integrity, edema, etc.): HR was 112 to 114 walking      Pertinent Vitals/Pain Pain Assessment: No/denies pain    Home Living                      Prior Function  PT Goals (current goals can now be found in the care plan section) Acute Rehab PT Goals Patient Stated Goal: get stronger, get home Progress towards PT goals: Progressing toward goals    Frequency    Min 4X/week      PT Plan Current plan remains appropriate    Co-evaluation              AM-PAC PT "6 Clicks" Mobility   Outcome Measure  Help needed turning from your back to your side while in a flat bed without using bedrails?: None Help  needed moving from lying on your back to sitting on the side of a flat bed without using bedrails?: None Help needed moving to and from a bed to a chair (including a wheelchair)?: A Little Help needed standing up from a chair using your arms (e.g., wheelchair or bedside chair)?: A Lot Help needed to walk in hospital room?: A Lot Help needed climbing 3-5 steps with a railing? : Total 6 Click Score: 16    End of Session Equipment Utilized During Treatment: Gait belt Activity Tolerance: Patient tolerated treatment well Patient left: in bed;with call bell/phone within reach;with bed alarm set Nurse Communication: Mobility status PT Visit Diagnosis: Unsteadiness on feet (R26.81);Other abnormalities of gait and mobility (R26.89);Muscle weakness (generalized) (M62.81);Difficulty in walking, not elsewhere classified (R26.2);Hemiplegia and hemiparesis;Other symptoms and signs involving the nervous system (R29.898) Hemiplegia - Right/Left: Right Hemiplegia - dominant/non-dominant: Dominant Hemiplegia - caused by: Cerebral infarction     Time: 1450-1513 PT Time Calculation (min) (ACUTE ONLY): 23 min  Charges:  $Gait Training: 8-22 mins $Therapeutic Exercise: 8-22 mins                    Ramond Dial 10/30/2019, 7:55 PM  Mee Hives, PT MS Acute Rehab Dept. Number: [place] [facility] [place]

## 2019-10-30 NOTE — Progress Notes (Signed)
Speech Language Pathology Treatment: Cognitive-Linquistic  Patient Details Name: Jeff [name] MRN: [id number] DOB: [birth year] Today's Date: 10/30/2019 Time: 7510-2585 20 minutes  Assessment / Plan / Recommendation Clinical Impression  [name] was seen for dysarthria and cognitive therapy after just finishing PT. Pt reported fatigue and appeared somewhat disinterested in speech therapy. He pleasantly cooperated anyway. Pt reported no change in memory or difficulty with memory, but was unable to recall any events of his day beyond the PT session he just finished. He reported he knew he had eaten lunch, but could not recall what he had. Pt was given 3 basic words to recall, but only had recall in 1/3 after two minute delay. Pt was given handout of memory strategies (WRAPS) for write it down, repeat it, associate it, picture it, schedule it. He read strategies aloud to practice his speech intelligibility. Pt discussed each strategy and a way he could use them, but largely reported he did not think he needed to use memory strategies, because he did not feel he had any memory difficulty (poor insight). Pt reports he has not been taking his medications PTA, but verbally answered several medication management questions with 100% acc'y. He also answered money mgmt questions with 100% acc'y. Deficits in cognition are mostly in memory. Dysarthria was targeted with ongoing R sided facial droop impacting articulation. He is noted with slurred speech with noted imprecision of consonants, specifically fricatives, affricates, and plosives. Despite imprecision, pt is mostly intelligible (95%+) to this unfamiliar listener and reports he has had no difficulty communicating with his friends. Based on ongoing memory impairment, recommend IPR or SNF level care at discharge. He remains a good candidate for rehabilitation, but requires assistance of cognitive tasks at this time due to memory deficits.    HPI HPI: Jeff [name] is a 74 y.o. male with medical history significant for cocaine use, prostate cancer, bilateral hydronephrosis and metastatic prostate cancer.  He was brought to the ED via EMS due to reports of right-sided weakness, and facial droop the started yesterday.  He also had slurred speech.  Patient was confused prior to arrival in the ED.  Patient's friend told EMS that patient seems to have weakness and trouble speaking.  He was last known normal yesterday.  At the time of admission, he was awake, alert, answering simple questions, but was not able to give a lot of details.  He was aware that his speech was abnormal.  He reported it started yesterday.  He was unaware of facial asymmetry and not able to report if he has weakness. He denied any difficulty breathing or chest pain.  He reported no cough, fevers or chills,  MRI was showing acute infarction affecting the left caudate and putamen and with patchy areas of involvement affecting the insular region and frontparietal cortical and subcortical brain.  He reported that he lives alone and has no family in the area.  He was not driving prior to admission.  He stated a friend took him grocery shopping.  He was completing basic cooking and cleaning.  He reported he stopped taking all his medication because it was "doing him no good" so he was not managing his home medications.        SLP Plan  Continue with current plan of care      Recommendations       Follow up Recommendations: Inpatient Rehab;Skilled Nursing facility SLP Visit Diagnosis: Dysarthria and anarthria (R47.1);Cognitive communication deficit (R41.841) Plan: Continue with current plan of care  Tatiyanna Lashley P. Kaiea Esselman, M.S., [place] Pathologist Acute Rehabilitation Services Pager: [place] 10/30/2019, 3:43 PM

## 2019-10-30 NOTE — Progress Notes (Signed)
PROGRESS NOTE    Jeff [name]  FOY:[id number] DOB: 03-15-1946 DOA: 10/25/2019 PCP: Holley Bouche, NP (Inactive)    Brief Narrative:  74 year old male with a history of prostate cancer, cocaine use, bilateral hydronephrosis, B12 deficiency, CKD stage III presenting with slurred speech and facial droop.  The patient is a poor historian.  Apparently he had a friend that noticed that he was having speech difficulty and facial droop and activated EMS.  Apparently this started on 10/24/2019.  The patient is unable to tell me any specific details regarding his facial droop or slurred speech other than the fact that he has noticed some speech difficulty.  The patient denies any headache, visual disturbance, focal extremity weakness, fevers, chills, chest pain, shortness breath, coughing, hemoptysis, nausea, vomiting, diarrhea, abdominal pain, dysuria.  He states that he has used cocaine within the past week.  He is unable to provide any significant details particularly with regard to timing of his neurologic deficits.  He is unable to tell me how long he has been using cocaine how often.  He smokes 1/2 pack/day.  In addition, the patient states that he drinks alcohol regularly although is unable to tell me the amount or frequency or last use of alcohol.  In the emergency department, the patient is awake and alert and follows commands.  He has been afebrile and hemodynamically stable albeit with soft blood pressures.  Oxygen saturation is 100% on room air.  CT of the brain showed a hypodensity in the left basal ganglia consistent with subacute infarct in the putamen and caudate nucleus.  BMP showed a serum creatinine of one-point 6 repeat LFTs were unremarkable.  WBC 4.2, hemoglobin 10.2, platelets 201,000.  The patient was admitted for further evaluation and treatment of his possible stroke.  EDP contacted Dr. Cheral Marker  Assessment & Plan:   Active Problems:   CVA (cerebral vascular accident) ([place])    Acute ischemic stroke ([place])   Cocaine abuse ([place])   CKD (chronic kidney disease) stage 3, GFR 30-59 ml/min   Acute metabolic encephalopathy   Prostate cancer metastatic to bone [facility])  Ischemic CVA -PT/OT recommendations for CIR noted -CT brain--hypodensity left basal ganglia consistent with subacute infarct in the putamen and caudate nucleus -MRI brain confirms acute infarct affecting the L caudate and putamen with patchy areas of involvement affecting the insular region and frontoparietal cortical and subcortical brain -Carotid Duplex without stenosis -Echo reviewed. See below. New EF of 35% ntoed -LDL 134, to continue atorvastatin 40mg  daily -HbA1C 6.3 -Antiplatelet--aspirin 81 mg daily -Neurology was consulted. BP goal to normalize to goal of <140/90 by discharge -Currently awaiting placement  CKD stage IIIb -Baseline creatinine 1.5-1.8 -Cr improved to 1.19 -Cont to follow renal panel  Metabolic encephalopathy -Unclear baseline -TSH of 1.083 -B12  of 1281 -Ammonia normal range -UA--no pyuria -remains slow in responses but responds appropriately  Metastatic prostate cancer -Patient follows Dr. 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 Echo reviewed personally -Pt denies chest pain or sob. Currently euvolemic with no LE edema  -have discussed with Cardiology on call. Recommendation for ultimately as BP tolerates and follow up with Cardiology as outpatient. -Pt with soft bp, thus will hold off on ARB at this time -Given cocaine abuse hx, would avoid beta blocker -  Cessation has been done at bedside on a daily basis  Hypotension -BP intermittently low, with sbp in the upper-80's to  low-90's -Suspect combination of above systolic failure as well as abruptly stopping his chronic hx of cocaine abuse -Discussed with pharmacy. OK to give trial of midodrine  DVT prophylaxis: Lovenox subq Code Status: Full Family Communication: Pt in room, family not at bedside  Status is: Inpatient  Remains inpatient appropriate because:Unsafe d/c plan and Inpatient level of care appropriate due to severity of illness   Dispo: The patient is from: Home              Anticipated d/c is to: CIR              Anticipated d/c date is: 2 days              Patient currently is medically stable to d/c. Just awaiting placeent   Consultants:   Neurology  Procedures:     Antimicrobials: Anti-infectives (From admission, onward)   None      Subjective: Without complaints at this time  Objective: Vitals:   10/30/19 0406 10/30/19 0807 10/30/19 1158 10/30/19 1558  BP: 108/71  91/65 (!) 85/72  Pulse:   (!) 57 64  Resp: 14  16 16   Temp: 98.2 F (36.8 C) 97.9 F (36.6 C) 97.8 F (36.6 C) 98.3 F (36.8 C)  TempSrc: Oral Oral Oral Oral  SpO2: 96%  98% 100%  Weight:      Height:        Intake/Output Summary (Last 24 hours) at 10/30/2019 1618 Last data filed at 10/30/2019 2751 Gross per 24 hour  Intake 575 ml  Output 1450 ml  Net -875 ml   Filed Weights   10/25/19 1312  Weight: 76.2 kg    Examination: General exam: Awake, laying in bed, in nad Respiratory system: Normal respiratory effort, no wheezing Cardiovascular system: regular rate, s1, s2 Gastrointestinal system: Soft, nondistended, positive BS Central nervous system: CN2-12 grossly intact, strength intact Extremities: Perfused, no clubbing Skin: Normal skin turgor, no notable skin lesions seen Psychiatry: Mood normal // no visual hallucinations   Data Reviewed: I have personally reviewed following labs and imaging studies  CBC: Recent Labs  Lab 10/25/19 1426 10/25/19 1441  WBC  --  4.2  NEUTROABS  --  3.0   HGB 10.9* 10.2*  HCT 32.0* 31.7*  MCV  --  92.7  PLT  --  700   Basic Metabolic Panel: Recent Labs  Lab 10/25/19 1441 10/27/19 0415 10/28/19 0415 10/29/19 0530 10/30/19 0432  NA 138 141 139 136 133*  K 4.4 4.0 3.8 4.3 4.3  CL 108 110 107 103 102  CO2 21* 21* 23 24 23   GLUCOSE 92 92 90 93 88  BUN 28* 22 16 18 16   CREATININE 1.63* 1.34* 1.30* 1.36* 1.19  CALCIUM 9.2 9.3 9.1 8.9 8.6*  MG  --   --  1.4* 2.0  --    GFR: Estimated Creatinine Clearance: 60.5 mL/min (by C-G formula based on SCr of 1.19 mg/dL). Liver Function Tests: Recent Labs  Lab 10/25/19 1441 10/28/19 0415 10/29/19 0530  AST 27 23 24   ALT 14 14 16   ALKPHOS 48 43 45  BILITOT 0.7 0.6 0.4  PROT 8.2* 6.8 6.8  ALBUMIN 3.9 3.0* 3.0*   No results for input(s): LIPASE, AMYLASE in the last 168 hours. Recent Labs  Lab 10/26/19 0738  AMMONIA 18   Coagulation Profile: Recent Labs  Lab  10/25/19 1441  INR 1.1   Cardiac Enzymes: No results for input(s): CKTOTAL, CKMB, CKMBINDEX, TROPONINI in the last 168 hours. BNP (last 3 results) No results for input(s): PROBNP in the last 8760 hours. HbA1C: No results for input(s): HGBA1C in the last 72 hours. CBG: No results for input(s): GLUCAP in the last 168 hours. Lipid Profile: No results for input(s): CHOL, HDL, LDLCALC, TRIG, CHOLHDL, LDLDIRECT in the last 72 hours. Thyroid Function Tests: No results for input(s): TSH, T4TOTAL, FREET4, T3FREE, THYROIDAB in the last 72 hours. Anemia Panel: No results for input(s): VITAMINB12, FOLATE, FERRITIN, TIBC, IRON, RETICCTPCT in the last 72 hours. Sepsis Labs: No results for input(s): PROCALCITON, LATICACIDVEN in the last 168 hours.  Recent Results (from the past 240 hour(s))  Urine culture     Status: Abnormal   Collection Time: 10/26/19 12:44 AM   Specimen: Urine, Clean Catch  Result Value Ref Range Status   Specimen Description   Final    URINE, CLEAN CATCH Performed at [facility], [address]  [place], [place] [address]    Special Requests   Final    NONE Performed at [facility], [address], [place], [place] [address]    Culture MULTIPLE SPECIES PRESENT, SUGGEST RECOLLECTION (A)  Final   Report Status 10/27/2019 FINAL  Final  SARS Coronavirus 2 by RT PCR (hospital order, performed in [facility] hospital lab) Nasopharyngeal Nasopharyngeal Swab     Status: None   Collection Time: 10/26/19 12:44 AM   Specimen: Nasopharyngeal Swab  Result Value Ref Range Status   SARS Coronavirus 2 NEGATIVE NEGATIVE Final    Comment: (NOTE) SARS-CoV-2 target nucleic acids are NOT DETECTED.  The SARS-CoV-2 RNA is generally detectable in upper and lower respiratory specimens during the acute phase of infection. The lowest concentration of SARS-CoV-2 viral copies this assay can detect is 250 copies / mL. A negative result does not preclude SARS-CoV-2 infection and should not be used as the sole basis for treatment or other patient management decisions.  A negative result may occur with improper specimen collection / handling, submission of specimen other than nasopharyngeal swab, presence of viral mutation(s) within the areas targeted by this assay, and inadequate number of viral copies (<250 copies / mL). A negative result must be combined with clinical observations, patient history, and epidemiological information.  Fact Sheet for Patients:   StrictlyIdeas.no  Fact Sheet for Healthcare Providers: BankingDealers.co.za  This test is not yet approved or  cleared by the [place] FDA and has been authorized for detection and/or diagnosis of SARS-CoV-2 by FDA under an Emergency Use Authorization (EUA).  This EUA will remain in effect (meaning this test can be used) for the duration of the COVID-19 declaration under Section 564(b)(1) of the Act, 21 U.S.C. section 360bbb-3(b)(1), unless the authorization is terminated or revoked  sooner.  Performed at [facility], [address] [place], [place] [address]      Radiology Studies: No results found.  Scheduled Meds: .  stroke: mapping our early stages of recovery book   Does not apply Once  . aspirin EC  81 mg Oral Daily  . atorvastatin  40 mg Oral Daily  . Chlorhexidine Gluconate Cloth  6 each Topical Daily  . enoxaparin (LOVENOX) injection  40 mg Subcutaneous QHS  . sodium chloride flush  10-40 mL Intracatheter Q12H   Continuous Infusions:   LOS: 5 days   Marylu Lund, MD [facility] Pager On Amion  If 7PM-7AM, please contact  night-coverage 10/30/2019, 4:18 PM

## 2019-10-31 NOTE — Progress Notes (Signed)
Physical Therapy Treatment Patient Details Name: Jeff [name] MRN: [id number] DOB: 1946/01/25 Today's Date: 10/31/2019    History of Present Illness 74yo male who began showing R sided weakness and facial droop on 6/30, brought to [place] on 7/1 and subsequently transferred to [facility] due to MRI showing acute L caudate and putamen CVA, also L M2 MCA occlusion. No tpa given, out of window. PMH acute renal failture, EtOH abuse, cocaine abuse, metastatic prostate CA, UTI, B nephrostomy    PT Comments    Pt progressing well towards all goals. Pt continues with R sided weakness and demos R knee buckling with ambulation, worsening with fatigue. Pt continues with noted R inattention however improving. Pt with increased ambulation tolerance as well. Pt lives alone however demonstrates excellent rehab potential to achieve safe mod I level of function in an aggressive rehab program like CIR. Acute PT to cont to follow.  Follow Up Recommendations  CIR     Equipment Recommendations  Rolling walker with 5" wheels;3in1 (PT)   Recommendations for Other Services       Precautions / Restrictions Precautions Precautions: Fall Precaution Comments: R weakness, inattention Restrictions Weight Bearing Restrictions: No    Mobility  Bed Mobility Overal bed mobility: Needs Assistance Bed Mobility: Supine to Sit     Supine to sit: Supervision     General bed mobility comments: HOB elevated, no physical assist  Transfers Overall transfer level: Needs assistance Equipment used: Rolling walker (2 wheeled) Transfers: Sit to/from Stand Sit to Stand: Min assist         General transfer comment: minA to power up and steady during transition of hands, verbal cues for safe hand placement  Ambulation/Gait Ambulation/Gait assistance: Min assist;+2 safety/equipment (for chair follow) Gait Distance (Feet): 150 Feet Assistive device: Rolling walker (2 wheeled) Gait Pattern/deviations: Step-through  pattern;Decreased stride length;Decreased dorsiflexion - right;Decreased weight shift to right Gait velocity: dec Gait velocity interpretation: <1.31 ft/sec, indicative of household ambulator General Gait Details: pt with noted R knee buckling requiring max verbal and tactile cues to extend R knee during stance phase, with onset of fatigue R knee buckling progressively worsening requiring modA to prevent fall, pt with better walker management today as well   Stairs             Wheelchair Mobility    Modified Rankin (Stroke Patients Only) Modified Rankin (Stroke Patients Only) Pre-Morbid Rankin Score: No symptoms Modified Rankin: Moderately severe disability     Balance Overall balance assessment: Needs assistance Sitting-balance support: Feet supported Sitting balance-Leahy Scale: Good Sitting balance - Comments: attempted to don socks but required minA for bilat feet   Standing balance support: Bilateral upper extremity supported;During functional activity Standing balance-Leahy Scale: Poor Standing balance comment: minA for stability standing at sink level during ADL completion                            Cognition Arousal/Alertness: Awake/alert Behavior During Therapy: Flat affect Overall Cognitive Status: Impaired/Different from baseline Area of Impairment: Awareness;Safety/judgement                       Following Commands: Follows one step commands consistently;Follows multi-step commands inconsistently Safety/Judgement: Decreased awareness of deficits Awareness: Emergent Problem Solving: Slow processing General Comments: pt with improved cognition but continues to demo R inattention and decreased awareness of deficits      Exercises      General Comments General comments (  skin integrity, edema, etc.): vss      Pertinent Vitals/Pain Pain Assessment: No/denies pain    Home Living                      Prior Function             PT Goals (current goals can now be found in the care plan section) Progress towards PT goals: Progressing toward goals    Frequency    Min 4X/week      PT Plan Current plan remains appropriate    Co-evaluation              AM-PAC PT "6 Clicks" Mobility   Outcome Measure  Help needed turning from your back to your side while in a flat bed without using bedrails?: None Help needed moving from lying on your back to sitting on the side of a flat bed without using bedrails?: None Help needed moving to and from a bed to a chair (including a wheelchair)?: A Little Help needed standing up from a chair using your arms (e.g., wheelchair or bedside chair)?: A Little Help needed to walk in hospital room?: A Lot Help needed climbing 3-5 steps with a railing? : A Lot 6 Click Score: 18    End of Session Equipment Utilized During Treatment: Gait belt Activity Tolerance: Patient tolerated treatment well Patient left: in chair;with call bell/phone within reach;with chair alarm set Nurse Communication: Mobility status PT Visit Diagnosis: Unsteadiness on feet (R26.81);Other abnormalities of gait and mobility (R26.89);Muscle weakness (generalized) (M62.81);Difficulty in walking, not elsewhere classified (R26.2);Hemiplegia and hemiparesis;Other symptoms and signs involving the nervous system (R29.898) Hemiplegia - Right/Left: Right Hemiplegia - dominant/non-dominant: Dominant Hemiplegia - caused by: Cerebral infarction     Time: 1211-1230 PT Time Calculation (min) (ACUTE ONLY): 19 min  Charges:  $Gait Training: 8-22 mins                     Kittie Plater, PT, DPT Acute Rehabilitation Services Pager #: [phone] Office #: [phone]    Berline Lopes 10/31/2019, 1:15 PM

## 2019-10-31 NOTE — Progress Notes (Signed)
PROGRESS NOTE    Jeff [name]  QPR:[id number] DOB: 01-26-1946 DOA: 10/25/2019 PCP: Holley Bouche, NP (Inactive)   Brief Narrative:  74 year old male with a history of prostate cancer, cocaine use, bilateral hydronephrosis, B12 deficiency, CKD stage III presenting with slurred speech and facial droop. The patient is a poor historian. Apparently he had a friend that noticed that he was having speech difficulty and facial droop and activated EMS. Apparently this started on 10/24/2019. CT of the brain showed a hypodensity in the left basal ganglia consistent with subacute infarct in the putamen and caudate nucleus. BMP showed a serum creatinine of one-point 6 repeat LFTs were unremarkable. WBC 4.2, hemoglobin 10.2, platelets 201,000. The patient was admitted for further evaluation and treatment of his possible stroke. EDP contacted Dr. Cheral Marker.  Diagnosed with acute CVA, seen by neurology team.  PT recommended CIR.  Research details are listed below   Assessment & Plan:   Active Problems:   CVA (cerebral vascular accident) ([place])   Acute ischemic stroke ([place])   Cocaine abuse ([place])   CKD (chronic kidney disease) stage 3, GFR 30-59 ml/min   Acute metabolic encephalopathy   Prostate cancer metastatic to bone [facility])  Prostate cancer metastatic to bone ([facility])  Ischemic CVA-left caudate and putamen with patchy area affecting the insular region and frontal cortical/subcortical brain -PT/OT recommendations for CIR noted -CT brain--hypodensity left basal ganglia consistent with subacute infarct in the putamen and caudate nucleus -MRI brain confirms acute infarct affecting the L caudate and putamen with patchy areas of involvement affecting the insular region and frontoparietal cortical and subcortical brain -Carotid Duplex without stenosis -Echo reviewed. See below. New EF of 35% ntoed -LDL 134, to continue atorvastatin 40mg  daily -HbA1C 6.3 -Antiplatelet--aspirin 81 mg daily -Neurology  was consulted. BP goal to normalize to goal of <140/90 by discharge -Currently awaiting placement  CKD stage IIIb -Baseline creatinine 1.3  Metabolic encephalopathy, resolved -Unclear baseline -TSH of 1.083 -B12 of 1281 -Ammonia normal range -UA--no pyuria  Metastatic prostate cancer -Patient follows Dr. Delton Coombes -Last chemotherapy 10/18/2019  Polysubstance abuse -Including tobacco, cocaine, alcohol -Alcohol withdrawal protocol -Cessation was done at bedside  Hyperlipidemia -Lipitor 40 mg daily  Systolic CHF, new diagnosis thus chronicity is unknown -EF noted to be 35% with some wall motion abnormality noted. Echo reviewed personally -Pt denies chest pain or sob. Currently euvolemic with no LE edema  -have discussed with Cardiology on call. Recommendation for ultimately as BP tolerates and follow up with Cardiology as outpatient. -Pt with soft bp, thus will hold off on ARB at this time -Given cocaine abuse hx, would avoid beta blocker -Cessation has been done at bedside on a daily basis   DVT prophylaxis: Lovenox Code Status: Full Family Communication: None  Status is: Inpatient  Remains inpatient appropriate because:Unsafe d/c plan   Dispo: The patient is from: Home              Anticipated d/c is to: CIR              Anticipated d/c date is: 1 day              Patient currently is medically stable to d/c.  Awaiting CIR bed       Body mass index is 22.78 kg/m.       Subjective: Feels okay no complaints  Review of Systems Otherwise negative except as per HPI, including: General: Denies fever, chills, night sweats or unintended weight loss. Resp: Denies cough, wheezing, shortness  of breath. Cardiac: Denies chest pain, palpitations, orthopnea, paroxysmal nocturnal dyspnea. GI: Denies abdominal pain, nausea, vomiting, diarrhea or constipation GU: Denies dysuria, frequency, hesitancy or incontinence MS: Denies muscle aches, joint pain or  swelling Neuro: Denies headache, neurologic deficits (focal weakness, numbness, tingling), abnormal gait Psych: Denies anxiety, depression, SI/HI/AVH Skin: Denies new rashes or lesions ID: Denies sick contacts, exotic exposures, travel  Examination:  General exam: Appears calm and comfortable  Respiratory system: Clear to auscultation. Respiratory effort normal. Cardiovascular system: S1 & S2 heard, RRR. No JVD, murmurs, rubs, gallops or clicks. No pedal edema. Gastrointestinal system: Abdomen is nondistended, soft and nontender. No organomegaly or masses felt. Normal bowel sounds heard. Central nervous system: Alert and oriented. No focal neurological deficits. Extremities: Symmetric 5 x 5 power. Skin: No rashes, lesions or ulcers Psychiatry: Judgement and insight appear normal. Mood & affect appropriate.     Objective: Vitals:   10/31/19 0000 10/31/19 0400 10/31/19 0749 10/31/19 1200  BP: 100/65 90/63 90/63  98/60  Pulse:   73 75  Resp:   18 16  Temp: 98.3 F (36.8 C) 98.6 F (37 C)    TempSrc: Oral Oral    SpO2:   98% 98%  Weight:      Height:        Intake/Output Summary (Last 24 hours) at 10/31/2019 1425 Last data filed at 10/31/2019 0416 Gross per 24 hour  Intake 10 ml  Output 1300 ml  Net -1290 ml   Filed Weights   10/25/19 1312  Weight: 76.2 kg     Data Reviewed:   CBC: Recent Labs  Lab 10/25/19 1426 10/25/19 1441  WBC  --  4.2  NEUTROABS  --  3.0  HGB 10.9* 10.2*  HCT 32.0* 31.7*  MCV  --  92.7  PLT  --  001   Basic Metabolic Panel: Recent Labs  Lab 10/25/19 1441 10/27/19 0415 10/28/19 0415 10/29/19 0530 10/30/19 0432  NA 138 141 139 136 133*  K 4.4 4.0 3.8 4.3 4.3  CL 108 110 107 103 102  CO2 21* 21* 23 24 23   GLUCOSE 92 92 90 93 88  BUN 28* 22 16 18 16   CREATININE 1.63* 1.34* 1.30* 1.36* 1.19  CALCIUM 9.2 9.3 9.1 8.9 8.6*  MG  --   --  1.4* 2.0  --    GFR: Estimated Creatinine Clearance: 60.5 mL/min (by C-G formula based on SCr of  1.19 mg/dL). Liver Function Tests: Recent Labs  Lab 10/25/19 1441 10/28/19 0415 10/29/19 0530  AST 27 23 24   ALT 14 14 16   ALKPHOS 48 43 45  BILITOT 0.7 0.6 0.4  PROT 8.2* 6.8 6.8  ALBUMIN 3.9 3.0* 3.0*   No results for input(s): LIPASE, AMYLASE in the last 168 hours. Recent Labs  Lab 10/26/19 0738  AMMONIA 18   Coagulation Profile: Recent Labs  Lab 10/25/19 1441  INR 1.1   Cardiac Enzymes: No results for input(s): CKTOTAL, CKMB, CKMBINDEX, TROPONINI in the last 168 hours. BNP (last 3 results) No results for input(s): PROBNP in the last 8760 hours. HbA1C: No results for input(s): HGBA1C in the last 72 hours. CBG: No results for input(s): GLUCAP in the last 168 hours. Lipid Profile: No results for input(s): CHOL, HDL, LDLCALC, TRIG, CHOLHDL, LDLDIRECT in the last 72 hours. Thyroid Function Tests: No results for input(s): TSH, T4TOTAL, FREET4, T3FREE, THYROIDAB in the last 72 hours. Anemia Panel: No results for input(s): VITAMINB12, FOLATE, FERRITIN, TIBC, IRON, RETICCTPCT in the last 72  hours. Sepsis Labs: No results for input(s): PROCALCITON, LATICACIDVEN in the last 168 hours.  Recent Results (from the past 240 hour(s))  Urine culture     Status: Abnormal   Collection Time: 10/26/19 12:44 AM   Specimen: Urine, Clean Catch  Result Value Ref Range Status   Specimen Description   Final    URINE, CLEAN CATCH Performed at [facility], [address], [place], [place] [address]    Special Requests   Final    NONE Performed at [facility], [address] [place], [place] [address]    Culture MULTIPLE SPECIES PRESENT, SUGGEST RECOLLECTION (A)  Final   Report Status 10/27/2019 FINAL  Final  SARS Coronavirus 2 by RT PCR (hospital order, performed in [facility] hospital lab) Nasopharyngeal Nasopharyngeal Swab     Status: None   Collection Time: 10/26/19 12:44 AM   Specimen: Nasopharyngeal Swab  Result Value Ref Range Status   SARS Coronavirus 2 NEGATIVE NEGATIVE  Final    Comment: (NOTE) SARS-CoV-2 target nucleic acids are NOT DETECTED.  The SARS-CoV-2 RNA is generally detectable in upper and lower respiratory specimens during the acute phase of infection. The lowest concentration of SARS-CoV-2 viral copies this assay can detect is 250 copies / mL. A negative result does not preclude SARS-CoV-2 infection and should not be used as the sole basis for treatment or other patient management decisions.  A negative result may occur with improper specimen collection / handling, submission of specimen other than nasopharyngeal swab, presence of viral mutation(s) within the areas targeted by this assay, and inadequate number of viral copies (<250 copies / mL). A negative result must be combined with clinical observations, patient history, and epidemiological information.  Fact Sheet for Patients:   StrictlyIdeas.no  Fact Sheet for Healthcare Providers: BankingDealers.co.za  This test is not yet approved or  cleared by the [place] FDA and has been authorized for detection and/or diagnosis of SARS-CoV-2 by FDA under an Emergency Use Authorization (EUA).  This EUA will remain in effect (meaning this test can be used) for the duration of the COVID-19 declaration under Section 564(b)(1) of the Act, 21 U.S.C. section 360bbb-3(b)(1), unless the authorization is terminated or revoked sooner.  Performed at [facility], [address] [place], [place] [address]          Radiology Studies: No results found.      Scheduled Meds: .  stroke: mapping our early stages of recovery book   Does not apply Once  . aspirin EC  81 mg Oral Daily  . atorvastatin  40 mg Oral Daily  . Chlorhexidine Gluconate Cloth  6 each Topical Daily  . enoxaparin (LOVENOX) injection  40 mg Subcutaneous QHS  . midodrine  2.5 mg Oral TID WC  . sodium chloride flush  10-40 mL Intracatheter Q12H   Continuous Infusions:    LOS: 6 days   Time spent= 20 mins    Lyllian Gause Arsenio Loader, MD [facility]  If 7PM-7AM, please contact night-coverage  10/31/2019, 2:25 PM

## 2019-10-31 NOTE — TOC CAGE-AID Note (Signed)
Transition of Care [facility]) - CAGE-AID Screening   Patient Details  Name: Jeff [name] MRN: [id number] Date of Birth: 1946/01/15  Transition of Care [facility]) CM/SW Contact:    Emeterio Reeve, [place] Phone Number: 10/31/2019, 3:39 PM   Clinical Narrative:  CSW met with pt at bedside. CSW introduced self and explained her role at the hospital. Pt stated he drinks alcohol occasionally. Pt states he uses cocaine daily. Pt states that he does feel like he has a problem with cocaine. Pt was receptive to resources and education.   CAGE-AID Screening:    Have You Ever Felt You Ought to Cut Down on Your Drinking or Drug Use?: Yes Have People Annoyed You By Critizing Your Drinking Or Drug Use?: No Have You Felt Bad Or Guilty About Your Drinking Or Drug Use?: No Have You Ever Had a Drink or Used Drugs First Thing In The Morning to STeady Your Nerves or to Get Rid of a Hangover?: No CAGE-AID Score: 1  Substance Abuse Education Offered: Yes  Substance abuse interventions: Patient Counseling, Educational Materials  Emeterio Reeve, Latanya Presser, [place] Social Worker [phone]

## 2019-10-31 NOTE — Progress Notes (Signed)
Inpatient Rehabilitation-Admissions Coordinator   Insurance authorization process was initiated yesterday morning for possible CIR admit. Await determination. Will update once there has been a decision.   Please call if questions.   Raechel Ache, OTR/L  Rehab Admissions Coordinator  [phone] 10/31/2019 10:43 AM

## 2019-11-01 DIAGNOSIS — G9341 Metabolic encephalopathy: Secondary | ICD-10-CM

## 2019-11-01 MED ORDER — MIDODRINE HCL 2.5 MG PO TABS: 2.5000 mg | ORAL_TABLET | Freq: Three times a day (TID) | ORAL | Status: DC

## 2019-11-01 MED ORDER — ATORVASTATIN CALCIUM 40 MG PO TABS: 40.0000 mg | ORAL_TABLET | Freq: Every day | ORAL | Status: DC

## 2019-11-01 MED ORDER — SENNOSIDES-DOCUSATE SODIUM 8.6-50 MG PO TABS: 1.0000 | ORAL_TABLET | Freq: Every evening | ORAL | Status: DC | PRN

## 2019-11-01 MED ORDER — ASPIRIN 81 MG PO TBEC: 81.0000 mg | DELAYED_RELEASE_TABLET | Freq: Every day | ORAL | 11 refills | Status: DC

## 2019-11-01 MED ORDER — STROKE: EARLY STAGES OF RECOVERY BOOK: 1.0000 | Freq: Once | Status: AC

## 2019-11-01 NOTE — Care Management Important Message (Signed)
Important Message  Patient Details  Name: Jeff [name] MRN: [id number] Date of Birth: 1946/04/21   Medicare Important Message Given:  Yes - Important Message mailed due to current National Emergency  Verbal consent obtained due to current National Emergency  Relationship to patient: Self Contact Name: Conal Shetley Call Date: 11/01/19  Time: 1024 Phone: [phone] Outcome: No Answer/Busy Important Message mailed to: Patient address on file    [name] 11/01/2019, 10:24 AM

## 2019-11-01 NOTE — Plan of Care (Signed)
  Problem: Education: Goal: Knowledge of disease or condition will improve Outcome: Progressing   Problem: Nutrition: Goal: Dietary intake will improve Outcome: Progressing   Problem: Education: Goal: Knowledge of General Education information will improve Description: Including pain rating scale, medication(s)/side effects and non-pharmacologic comfort measures Outcome: Progressing

## 2019-11-01 NOTE — H&P (Signed)
Physical Medicine and Rehabilitation Admission H&P    Chief Complaint  Patient presents with  . Stroke with functional deficits.    HPI:  Jeff [name] is a 74 year old male with history of metastatic prostate cancer, ETOH abuse, cocaine abuse who was admitted on 10/25/2019 with right facial droop, dysarthria, right hemiparesis and AMS.  History taken from chart review and patient.  Lase seen normal day before and SBP low at admission 90-110 range. UDS positive for cocaine.  MRI/MRA brain done revealing acute left caudate and putamen infarct, patchy areas of involvement in insular, fronto-parietal cortical and subcortical brain and mild swelling. 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  Carotid dopplers were negative for significant ICA stenosis.  Echocardiogram with ejection fraction of 35% with no LVH and boderline dilatation of aortic root. 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 BP remains soft and ProAmatine increased to 5 mg tid--avoid BB due to cocaine abuse hx. Patient with resultant dysarthria and anarthria, right sided weakness with numbness, right inattention and impulsivity. CIR recommended due to functional deficits.  Please see preadmission assessment from earlier today as well.  Review of Systems  Constitutional: Negative for chills and fever.  HENT: Negative for hearing loss and tinnitus.   Eyes: Negative for blurred vision and double vision.  Respiratory: Negative for cough and shortness of breath.   Cardiovascular: Negative for chest pain, palpitations and leg swelling.  Gastrointestinal: Negative for heartburn and nausea.  Genitourinary: Negative for dysuria and urgency.  Musculoskeletal: Negative for joint pain and myalgias.  Skin: Negative for itching and rash.  Neurological: Positive for speech change, focal weakness and weakness. Negative for sensory change.  Psychiatric/Behavioral: The patient does  not have insomnia.   All other systems reviewed and are negative.  Past Medical History:  Diagnosis Date  . Acute renal failure ([place]) 01/26/2014  . Alcohol abuse   . Anemia of chronic disease 01/26/2014  . Bilateral hydronephrosis 01/26/2014  . History of cocaine abuse ([place]) 01/26/2014  . Homelessness 01/31/2014  . Prostate cancer metastatic to multiple sites ([place]) 01/30/2014  . Sepsis ([place])   . UTI (lower urinary tract infection)     Past Surgical History:  Procedure Laterality Date  . ORCHIECTOMY Bilateral 02/05/2014   Procedure: BILATERAL ORCHIECTOMY;  Surgeon: Festus Aloe, MD;  Location: [facility];  Service: Urology;  Laterality: Bilateral;  . PERCUTANEOUS NEPHROSTOMY Bilateral    IR Dr. Junious Silk changed on 04/22/2014  . PORT-A-CATH REMOVAL Right 08/08/2017   Procedure: REMOVAL PORT-A-CATH RIGHT CHEST (PROCEDURE #2);  Surgeon: Aviva Signs, MD;  Location: [facility];  Service: General;  Laterality: Right;  . PORTACATH PLACEMENT Right 03/12/14  . PORTACATH PLACEMENT Left 08/08/2017   Procedure: INSERTION POWER PORT WITH ATTACHED CATHETER LEFT SUBCLAVIAN (PROCEDURE #1);  Surgeon: Aviva Signs, MD;  Location: [facility];  Service: General;  Laterality: Left;    Family History  Problem Relation Age of Onset  . Cancer Brother 3    Social History:   Lives alone. Does not drive. Manages home and cooks.  Has a friend who can check in after discharge. He reports that he has been smoking cigarettes--one pack/week.  He has a 13.75 pack-year smoking history. He has never used smokeless tobacco. He reports current alcohol use of about 1.0 beer per week. [name] Kitchen He reports current drug use. Drug: Cocaine--1-2 x week.     Allergies: No Known Allergies  Medications Prior to Admission  Medication Sig Dispense Refill  . calcium gluconate 500 MG tablet Take 1 tablet by mouth 2 (two) times daily.    . prochlorperazine (COMPAZINE) 10 MG tablet The day after chemo take 1 tab four times a day x 2 days. Then  may take 1 tab four times a day if needed for nausea/vomiting. 60 tablet 2    Drug Regimen Review  Drug regimen was reviewed and remains appropriate with no significant issues identified  Home: Home Living Family/patient expects to be discharged to:: Private residence Living Arrangements: Alone, Non-relatives/Friends Available Help at Discharge: Friend(s), Available PRN/intermittently Type of Home: Apartment Home Access: Elevator Home Layout: One level Bathroom Shower/Tub: Chiropodist: Standard Bathroom Accessibility: Yes Home Equipment: None  Lives With: Alone   Functional History: Prior Function Level of Independence: Independent Comments: Lives with friends; reports independence prior  Functional Status:  Mobility: Bed Mobility Overal bed mobility: Needs Assistance Bed Mobility: Supine to Sit Supine to sit: Supervision Sit to supine: Supervision, HOB elevated General bed mobility comments: HOB elevated, no physical assist, supervision for safety Transfers Overall transfer level: Needs assistance Equipment used: Rolling walker (2 wheeled) Transfers: Sit to/from Stand Sit to Stand: Min assist, Min guard General transfer comment: min guard from EOB and MIN A topowr up from chair with no arm rests Ambulation/Gait Ambulation/Gait assistance: Min assist, +2 safety/equipment (for chair follow) Gait Distance (Feet): 150 Feet Assistive device: Rolling walker (2 wheeled) Gait Pattern/deviations: Step-through pattern, Decreased stride length, Decreased dorsiflexion - right, Decreased weight shift to right General Gait Details: pt with noted R knee buckling requiring max verbal and tactile cues to extend R knee during stance phase, with onset of fatigue R knee buckling progressively worsening requiring modA to prevent fall, pt with better walker management today as well Gait velocity: dec Gait velocity interpretation: <1.31 ft/sec, indicative of household  ambulator    ADL: ADL Overall ADL's : Needs assistance/impaired Eating/Feeding: Set up, Sitting Grooming: Oral care, Sitting, Standing, Min guard Grooming Details (indicate cue type and reason): min guard for balance while standing at sink with RW; pt tachy neeiding to sit down during standing ADL HR 132 bpm Upper Body Bathing: Minimal assistance, Sitting Lower Body Bathing: Maximal assistance, Sitting/lateral leans, Sit to/from stand Upper Body Dressing : Minimal assistance, Sitting Lower Body Dressing: Supervision/safety, Sitting/lateral leans Lower Body Dressing Details (indicate cue type and reason): to adjust socks from EOB Toilet Transfer: Minimal assistance, RW, Ambulation Toilet Transfer Details (indicate cue type and reason): simulated via functional mobility with RW; min A for balance and RW mgmt Toileting- Clothing Manipulation and Hygiene: Minimal assistance Toileting - Clothing Manipulation Details (indicate cue type and reason): pt completed posterior care standing with minA Functional mobility during ADLs: Minimal assistance, Rolling walker General ADL Comments: pt tachy during session limiting further ADLs or mobility tasks  Cognition: Cognition Overall Cognitive Status: Impaired/Different from baseline Arousal/Alertness: Awake/alert Orientation Level: Oriented X4 Attention: Sustained Sustained Attention: Impaired Sustained Attention Impairment: Verbal basic Memory: Impaired Memory Impairment: Decreased recall of new information Awareness: Impaired Awareness Impairment: Intellectual impairment Problem Solving: Appears intact Safety/Judgment: Impaired Cognition Arousal/Alertness: Awake/alert Behavior During Therapy: Flat affect Overall Cognitive Status: Impaired/Different from baseline Area of Impairment: Awareness, Safety/judgement, Problem solving Following Commands: Follows one step commands consistently, Follows multi-step commands  inconsistently Safety/Judgement: Decreased awareness of safety Awareness: Intellectual Problem Solving: Slow processing General Comments: pt continues to be very flat. mild safety impairments noted while managing RW, slightly slow to process  Blood pressure 93/70, pulse 70, temperature 98.5 F (36.9 C), temperature source Oral, resp. rate 20, height 6' (1.829 m), weight 76.2 kg, SpO2 97 %. Physical Exam Vitals and nursing note reviewed.  Constitutional:      General: He is not in acute distress.    Comments: Thin male  HENT:     Head: Normocephalic and atraumatic.     Comments: Poor dentition    Right Ear: External ear normal.     Left Ear: External ear normal.     Nose: Nose normal.  Eyes:     General:        Right eye: No discharge.        Left eye: No discharge.     Extraocular Movements: Extraocular movements intact.  Cardiovascular:     Rate and Rhythm: Normal rate and regular rhythm.  Pulmonary:     Effort: Pulmonary effort is normal. No respiratory distress.     Breath sounds: No stridor.  Abdominal:     General: Abdomen is flat. There is no distension.  Musculoskeletal:     Cervical back: Normal range of motion and neck supple.     Comments: No edema or tenderness in extremities  Skin:    General: Skin is warm and dry.  Neurological:     Mental Status: He is alert and oriented to person, place, and time.     Comments: Right facial weakness Dysarthria Mild left inattention.  He is able to follow simple one step commands.  Motor: RUE: 4+/5 proximal distal RLE: 4+/5 proximal distal LUE/LLE: 5/5 proximal distal  Psychiatric:        Speech: Speech is delayed.        Behavior: Behavior is slowed.     No results found for this or any previous visit (from the past 48 hour(s)). No results found.     Medical Problem List and Plan: 1.  Dysarthria, right hemiparesis with numbness, right inattention and impulsivity secondary to acute left caudate and putamen  infarct with involvement in insular, fronto-parietal cortical and subcortical brain and mild swelling.   -patient may shower  -ELOS/Goals: 7-10 days/supervision/mod I  Admit to CIR 2.  Antithrombotics: -DVT/anticoagulation:  Pharmaceutical: Lovenox  -antiplatelet therapy: Low dose ASA 3. Pain Management:  4. Mood: LCSW to follow for evaluation and support.   -antipsychotic agents: N/a 5. Neuropsych: This patient is not fully capable of making decisions on his own behalf. 6. Skin/Wound Care: Routine pressure relief measures.  7. Fluids/Electrolytes/Nutrition: Monitor I/Os.  CMP ordered. 8. Hypotension: BP remains soft 80-100 range but asymptomatic. Will continue to monitor BP --continue ProAmatine for BP support. Increased to 5 mg tid.  Monitor with increased mobility  9. 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 Delton Coombes. Last infusion  06/23 10. CKD IIIb: Baseline SCr 1.7-1.8 range per records. Will continue to monitor.   CMP ordered. 11. Polysubstance abuse: Continue to counsel on cessation.  12. Anemia of chronic disease:  Will continue to monitor.   CBC ordered.  Bary Leriche, PA-C 11/03/2019  I have personally performed a face to face diagnostic evaluation, including, but not limited to relevant history and physical exam findings, of this patient and developed relevant assessment and plan.  Additionally, I have reviewed and concur with the physician assistant's documentation above.  Delice Lesch, MD, ABPMR

## 2019-11-01 NOTE — Discharge Summary (Addendum)
Physician Discharge Summary  Jeff [name] QJJ:[id number] DOB: [birth year] DOA: 10/25/2019  PCP: Holley Bouche, NP (Inactive)  Admit date: 10/25/2019 Discharge date: 11/03/2019  Admitted From: Home Disposition: CIR  Recommendations for Outpatient Follow-up:  1. Follow up with PCP in 1-2 weeks 2. Please obtain BMP/CBC in one week your next doctors visit.  3. Daily aspirin 81 mg 4. Lipitor 40 mg daily 5. Follow-up outpatient cardiology in the next 2-4 weeks for new onset systolic CHF for further medication adjustment as necessary  Discharge Condition: Stable CODE STATUS: Full code Diet recommendation: Heart healthy  Brief/Interim Summary: 74 year old male with a history of prostate cancer, cocaine use, bilateral hydronephrosis, B12 deficiency, CKD stage III presenting with slurred speech and facial droop. The patient is a poor historian. Apparently he had a friend that noticed that he was having speech difficulty and facial droop and activated EMS. Apparently this started on 10/24/2019. CT of the brain showed a hypodensity in the left basal ganglia consistent with subacute infarct in the putamen and caudate nucleus. BMP showed a serum creatinine of one-point 6 repeat LFTs were unremarkable. WBC 4.2, hemoglobin 10.2, platelets 201,000. The patient was admitted for further evaluation and treatment of his possible stroke. EDP contacted Dr. Cheral Marker.  Diagnosed with acute CVA, seen by neurology team.  PT recommended CIR. rest of the details listed below   Assessment & Plan:   Active Problems:   CVA (cerebral vascular accident) ([place])   Acute ischemic stroke ([place])   Cocaine abuse ([place])   CKD (chronic kidney disease) stage 3, GFR 30-59 ml/min   Acute metabolic encephalopathy   Prostate cancer metastatic to bone [facility])  Prostate cancer metastatic to bone ([facility])  Ischemic CVA-left caudate and putamen with patchy area affecting the insular region and frontal cortical/subcortical  brain -PT/OT-CIR -CT brain--hypodensity left basal ganglia consistent with subacute infarct in the putamen and caudate nucleus -MRI brain confirms acute infarct affecting the L caudate and putamen with patchy areas of involvement affecting the insular region and frontoparietal cortical and subcortical brain -Carotid Duplex without stenosis -Echo reviewed. See below. New EF of 35% ntoed -LDL 134, to continue atorvastatin 40mg  daily -HbA1C 6.3 -Antiplatelet--aspirin 81 mg daily  CKD stage IIIb -Baseline creatinine 1.3, stable  Metabolic encephalopathy, resolved -Unclear baseline -TSH of 1.083 -B12of 1281 -Ammonia normal range -UA--no pyuria  Metastatic prostate cancer -Patient follows Dr. Delton Coombes. -Last chemotherapy 10/18/2019.  Per notes it appears he gets chemo every 3 weeks  Polysubstance abuse -Including tobacco, cocaine, alcohol -Alcohol withdrawal protocol -Cessation was done at bedside  Hyperlipidemia -Lipitor 40 mg daily  Systolic CHF, new diagnosis thus chronicity is unknown -EF noted to be 35% with some wall motion abnormality noted. Echo reviewed personally -Pt denies chest pain or sob. Currently euvolemic with no LE edema  -have discussed with Cardiology on call. Recommendation for ultimately as BP tolerates and follow up with Cardiology as outpatient. -Pt with soft bp, thus will hold off on ARB at this time -Given cocaine abuse hx, would avoid beta blocker -Cessation has been done at bedside on a daily basis  Body mass index is 22.78 kg/m.   Discharge Diagnoses:  Active Problems:   CVA (cerebral vascular accident) ([place])   Acute ischemic stroke ([place])   Cocaine abuse ([place])   CKD (chronic kidney disease) stage 3, GFR 30-59 ml/min   Acute metabolic encephalopathy   Prostate cancer metastatic to bone ([place])   Hypotension      Consultations:  Neurology  Subjective: Doing okay  no complaints this morning tolerating orals without any  issues.  Discharge Exam: Vitals:   11/03/19 0402 11/03/19 0754  BP: 90/62 93/70  Pulse: 71 70  Resp: 18 20  Temp: 98.3 F (36.8 C) 98.5 F (36.9 C)  SpO2: 100% 97%   Vitals:   11/02/19 1928 11/02/19 2324 11/03/19 0402 11/03/19 0754  BP: 107/71 95/72 90/62  93/70  Pulse: 76 73 71 70  Resp: 17 20 18 20   Temp: 98.2 F (36.8 C) 98 F (36.7 C) 98.3 F (36.8 C) 98.5 F (36.9 C)  TempSrc: Oral Oral Oral Oral  SpO2: 99% 100% 100% 97%  Weight:      Height:        General: Pt is alert, awake, not in acute distress Cardiovascular: RRR, S1/S2 +, no rubs, no gallops Respiratory: CTA bilaterally, no wheezing, no rhonchi Abdominal: Soft, NT, ND, bowel sounds + Extremities: no edema, no cyanosis  Discharge Instructions   Allergies as of 11/03/2019   No Known Allergies     Medication List    TAKE these medications   aspirin 81 MG EC tablet Take 1 tablet (81 mg total) by mouth daily. Swallow whole.   atorvastatin 40 MG tablet Commonly known as: LIPITOR Take 1 tablet (40 mg total) by mouth daily.   calcium gluconate 500 MG tablet Take 1 tablet by mouth 2 (two) times daily.   midodrine 5 MG tablet Commonly known as: PROAMATINE Take 1 tablet (5 mg total) by mouth 3 (three) times daily with meals.   prochlorperazine 10 MG tablet Commonly known as: COMPAZINE The day after chemo take 1 tab four times a day x 2 days. Then may take 1 tab four times a day if needed for nausea/vomiting.   senna-docusate 8.6-50 MG tablet Commonly known as: Senokot-S Take 1 tablet by mouth at bedtime as needed for mild constipation or moderate constipation.     ASK your doctor about these medications    stroke: mapping our early stages of recovery book Misc 1 each by Does not apply route once for 1 dose. Ask about: Should I take this medication?       Follow-up Information    Holley Bouche, NP. Schedule an appointment as soon as possible for a visit in 2 week(s).   Specialties: Nurse  Practitioner, Hematology, Oncology Contact information: [place] [address] [phone]        [place]. Schedule an appointment as soon as possible for a visit in 3 week(s).   Contact information: [address]     [place] [address] [phone]             No Known Allergies  You were cared for by a hospitalist during your hospital stay. If you have any questions about your discharge medications or the care you received while you were in the hospital after you are discharged, you can call the unit and asked to speak with the hospitalist on call if the hospitalist that took care of you is not available. Once you are discharged, your primary care physician will handle any further medical issues. Please note that no refills for any discharge medications will be authorized once you are discharged, as it is imperative that you return to your primary care physician (or establish a relationship with a primary care physician if you do not have one) for your aftercare needs so that they can reassess your need for medications and monitor your lab  values.   Procedures/Studies: CT HEAD WO CONTRAST  Result Date: 10/25/2019 CLINICAL DATA:  Focal neuro deficit greater than 6 hours. Right sided weakness and facial droop since yesterday. EXAM: CT HEAD WITHOUT CONTRAST TECHNIQUE: Contiguous axial images were obtained from the base of the skull through the vertex without intravenous contrast. COMPARISON:  None. FINDINGS: Brain: Ill-defined hypodensity in the left basal ganglia consistent with infarct which could be subacute and account for the patient's symptoms. This involves the putamen and caudate. Mild atrophy. Negative for hemorrhage or mass. Small chronic infarct in the left cerebellum. Vascular: Negative for hyperdense vessel Skull: Negative Sinuses/Orbits: Paranasal sinuses clear.  Negative orbit. Other: None IMPRESSION:  Hypodensity in the left basal ganglia compatible with subacute infarct involving the putamen and caudate nucleus. No intracranial hemorrhage. These results were called by telephone at the time of interpretation on 10/25/2019 at 2:15 pm to provider Sherwood Gambler , who verbally acknowledged these results. Electronically Signed   By: Franchot Gallo M.D.   On: 10/25/2019 14:15   MR ANGIO HEAD WO CONTRAST  Result Date: 10/26/2019 CLINICAL DATA:  Slurred speech. Right facial droop. Right-sided weakness. EXAM: MRI HEAD WITHOUT CONTRAST MRA HEAD WITHOUT CONTRAST TECHNIQUE: Multiplanar, multiecho pulse sequences of the brain and surrounding structures were obtained without intravenous contrast. Angiographic images of the head were obtained using MRA technique without contrast. COMPARISON:  Head CT 10/25/2019 FINDINGS: MRI HEAD FINDINGS Brain: Diffusion imaging shows acute infarction in the right middle cerebral artery territory affecting the corpus striata arm portion of the basal ganglia, the insular region and the frontoparietal brain in a patchy and scattered fashion. No large confluent infarction. Mild swelling in the regions of infarction but no mass effect. No discernible hemorrhagic transformation. Elsewhere, the brainstem is normal. There are old small vessel infarctions within the cerebellum on the left. The cerebral hemispheres do not show a pattern of widespread small-vessel disease. No mass lesion, hydrocephalus or extra-axial collection. Vascular: Major vessels at the base of the brain show flow. Skull and upper cervical spine: Negative Sinuses/Orbits: Clear/normal Other: None MRA HEAD FINDINGS Both internal carotid arteries are widely patent through the skull base and siphon regions. The right middle cerebral artery and both anterior cerebral arteries show normal flow. The left middle cerebral artery is patent to the bifurcation. The inferior division shows flow but the superior division is occluded. Both  vertebral arteries are patent through the foramen magnum with the right being dominant. The left vertebral artery supplies PICA, with a tiny contribution to the basilar. Dominant right vertebral artery widely patent to the basilar. No basilar stenosis. Superior cerebellar and posterior cerebral arteries show flow. IMPRESSION: Acute infarction affecting the left caudate and putamen and with patchy areas of involvement affecting the insular region and frontoparietal cortical and subcortical brain. Mild swelling but no hemorrhage. 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 MR angiography shows occlusion of the superior division MCA M2 branch on the left. Electronically Signed   By: Nelson Chimes M.D.   On: 10/26/2019 15:26   MR BRAIN WO CONTRAST  Result Date: 10/26/2019 CLINICAL DATA:  Slurred speech. Right facial droop. Right-sided weakness. EXAM: MRI HEAD WITHOUT CONTRAST MRA HEAD WITHOUT CONTRAST TECHNIQUE: Multiplanar, multiecho pulse sequences of the brain and surrounding structures were obtained without intravenous contrast. Angiographic images of the head were obtained using MRA technique without contrast. COMPARISON:  Head CT 10/25/2019 FINDINGS: MRI HEAD FINDINGS Brain: Diffusion imaging shows acute infarction in the right middle cerebral artery territory affecting the  corpus striata arm portion of the basal ganglia, the insular region and the frontoparietal brain in a patchy and scattered fashion. No large confluent infarction. Mild swelling in the regions of infarction but no mass effect. No discernible hemorrhagic transformation. Elsewhere, the brainstem is normal. There are old small vessel infarctions within the cerebellum on the left. The cerebral hemispheres do not show a pattern of widespread small-vessel disease. No mass lesion, hydrocephalus or extra-axial collection. Vascular: Major vessels at the base of the brain show flow. Skull and upper cervical spine: Negative Sinuses/Orbits:  Clear/normal Other: None MRA HEAD FINDINGS Both internal carotid arteries are widely patent through the skull base and siphon regions. The right middle cerebral artery and both anterior cerebral arteries show normal flow. The left middle cerebral artery is patent to the bifurcation. The inferior division shows flow but the superior division is occluded. Both vertebral arteries are patent through the foramen magnum with the right being dominant. The left vertebral artery supplies PICA, with a tiny contribution to the basilar. Dominant right vertebral artery widely patent to the basilar. No basilar stenosis. Superior cerebellar and posterior cerebral arteries show flow. IMPRESSION: Acute infarction affecting the left caudate and putamen and with patchy areas of involvement affecting the insular region and frontoparietal cortical and subcortical brain. Mild swelling but no hemorrhage. 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 MR angiography shows occlusion of the superior division MCA M2 branch on the left. Electronically Signed   By: Nelson Chimes M.D.   On: 10/26/2019 15:26   US Carotid Bilateral (at [facility] and [facility] only)  Result Date: 10/26/2019 CLINICAL DATA:  74 year old male with a history of stroke EXAM: BILATERAL CAROTID DUPLEX ULTRASOUND TECHNIQUE: Pearline Cables scale imaging, color Doppler and duplex ultrasound were performed of bilateral carotid and vertebral arteries in the neck. COMPARISON:  None. FINDINGS: Criteria: Quantification of carotid stenosis is based on velocity parameters that correlate the residual internal carotid diameter with NASCET-based stenosis levels, using the diameter of the distal internal carotid lumen as the denominator for stenosis measurement. The following velocity measurements were obtained: RIGHT ICA:  Systolic 81 cm/sec, Diastolic 29 cm/sec CCA:  82 cm/sec SYSTOLIC ICA/CCA RATIO:  1.0 ECA:  70 cm/sec LEFT ICA:  Systolic 78 cm/sec, Diastolic 25 cm/sec CCA:  91 cm/sec SYSTOLIC  ICA/CCA RATIO:  0.9 ECA:  75 cm/sec Right Brachial SBP: Not acquired Left Brachial SBP: Not acquired RIGHT CAROTID ARTERY: No significant calcified disease of the right common carotid artery. Intermediate waveform maintained. Heterogeneous plaque without significant calcifications at the right carotid bifurcation. Low resistance waveform of the right ICA. No significant tortuosity. RIGHT VERTEBRAL ARTERY: Antegrade flow with low resistance waveform. LEFT CAROTID ARTERY: No significant calcified disease of the left common carotid artery. Intermediate waveform maintained. Heterogeneous plaque at the left carotid bifurcation without significant calcifications. Low resistance waveform of the left ICA. LEFT VERTEBRAL ARTERY:  Antegrade flow with low resistance waveform. IMPRESSION: Color duplex indicates minimal heterogeneous plaque, with no hemodynamically significant stenosis by duplex criteria in the extracranial cerebrovascular circulation. Signed, Dulcy Fanny. Dellia Nims, RPVI [facility] Radiology Electronically Signed   By: Corrie Mckusick D.O.   On: 10/26/2019 11:10   ECHOCARDIOGRAM COMPLETE  Result Date: 10/26/2019    ECHOCARDIOGRAM REPORT   Patient Name:   Jeff [name] Date of Exam: 10/26/2019 Medical Rec #:  [id number]       Height:       72.0 in Accession #:    [id number]  Weight:       168.0 lb Date of Birth:  02/20/1946       BSA:          1.978 m Patient Age:    89 years        BP:           114/74 mmHg Patient Gender: M               HR:           59 bpm. Exam Location:  Inpatient Procedure: 2D Echo                              MODIFIED REPORT: This report was modified by Cherlynn Kaiser MD on 10/26/2019 due to revision.  Indications:     Stroke 434.91 / I163.9  History:         Patient has no prior history of Echocardiogram examinations.                  Signs/Symptoms:Hypotension. Cocaine use, prostate cancer,                  bilateral hydronephrosis  metastatic prostate cancer, Metabolic                  encephalopathy, Normocytic anemia, chronic kidney disease.  Sonographer:     Darlina Sicilian RDCS Referring Phys:  [id number] [facility] Diagnosing Phys: Cherlynn Kaiser MD  Sonographer Comments: No IV access, unable to perform Definity to entirely exclude LV thrombus. IMPRESSIONS  1. Left ventricular ejection fraction, by estimation, is 35%. The left ventricle has moderately decreased function. The left ventricle demonstrates regional wall motion abnormalities (see scoring diagram/findings for description). Left ventricular diastolic parameters are indeterminate.  2. Right ventricular systolic function is normal. The right ventricular size is normal. Tricuspid regurgitation signal is inadequate for assessing PA pressure.  3. The mitral valve is normal in structure. Trivial mitral valve regurgitation. No evidence of mitral stenosis.  4. The aortic valve is tricuspid. Aortic valve regurgitation is mild. No aortic stenosis is present.  5. Aortic dilatation noted. There is borderline dilatation of the aortic root measuring 39 mm.  6. The inferior vena cava is normal in size with greater than 50% respiratory variability, suggesting right atrial pressure of 3 mmHg. Conclusion(s)/Recommendation(s): No intracardiac source of embolism detected on this transthoracic study. A transesophageal echocardiogram is recommended to exclude cardiac source of embolism if clinically indicated. FINDINGS  Left Ventricle: Left ventricular ejection fraction, by estimation, is 35%. The left ventricle has moderately decreased function. The left ventricle demonstrates regional wall motion abnormalities. The left ventricular internal cavity size was normal in size. There is no left ventricular hypertrophy. Left ventricular diastolic parameters are indeterminate.  LV Wall Scoring: Mild basal hypokinesis with severe hypokinesis of the apex. Right Ventricle: The right ventricular size is  normal. No increase in right ventricular wall thickness. Right ventricular systolic function is normal. Tricuspid regurgitation signal is inadequate for assessing PA pressure. Left Atrium: Left atrial size was normal in size. Right Atrium: Right atrial size was normal in size. Pericardium: A small pericardial effusion is present. Mitral Valve: The mitral valve is normal in structure. Normal mobility of the mitral valve leaflets. Trivial mitral valve regurgitation. No evidence of mitral valve stenosis. Tricuspid Valve: The tricuspid valve is normal in structure. Tricuspid valve regurgitation is trivial. No evidence of tricuspid stenosis. Aortic Valve: The aortic valve  is tricuspid. . There is mild thickening of the aortic valve. Aortic valve regurgitation is mild. No aortic stenosis is present. There is mild thickening of the aortic valve. Pulmonic Valve: The pulmonic valve was grossly normal. Pulmonic valve regurgitation is trivial. No evidence of pulmonic stenosis. Aorta: Aortic dilatation noted and the ascending aorta was not well visualized. There is borderline dilatation of the aortic root measuring 39 mm. Venous: The inferior vena cava is normal in size with greater than 50% respiratory variability, suggesting right atrial pressure of 3 mmHg. IAS/Shunts: The interatrial septum was not well visualized.  LEFT VENTRICLE PLAX 2D LVIDd:         5.20 cm      Diastology LVIDs:         3.80 cm      LV e' lateral: 5.66 cm/s LV PW:         0.90 cm      LV e' medial:  4.57 cm/s LV IVS:        1.00 cm LVOT diam:     2.00 cm LV SV:         60 LV SV Index:   30 LVOT Area:     3.14 cm     3D Volume EF:                             3D EF:        37 %                             LV EDV:       149 ml LV Volumes (MOD)            LV ESV:       93 ml LV vol d, MOD A2C: 123.5 ml LV SV:        56 ml LV vol d, MOD A4C: 124.0 ml LV vol s, MOD A2C: 95.9 ml LV vol s, MOD A4C: 62.6 ml LV SV MOD A2C:     27.7 ml LV SV MOD A4C:     124.0 ml  LV SV MOD BP:      45.2 ml RIGHT VENTRICLE RV S prime:     12.20 cm/s TAPSE (M-mode): 1.9 cm LEFT ATRIUM             Index       RIGHT ATRIUM           Index LA diam:        3.30 cm 1.67 cm/m  RA Area:     10.30 cm LA Vol (A2C):   65.4 ml 33.06 ml/m RA Volume:   20.80 ml  10.51 ml/m LA Vol (A4C):   51.0 ml 25.78 ml/m LA Biplane Vol: 57.0 ml 28.81 ml/m  AORTIC VALVE LVOT Vmax:   108.00 cm/s LVOT Vmean:  64.500 cm/s LVOT VTI:    0.190 m  AORTA Ao Root diam: 3.90 cm  SHUNTS Systemic VTI:  0.19 m Systemic Diam: 2.00 cm Cherlynn Kaiser MD Electronically signed by Cherlynn Kaiser MD Signature Date/Time: 10/26/2019/3:58:27 PM    Final (Updated)      The results of significant diagnostics from this hospitalization (including imaging, microbiology, ancillary and laboratory) are listed below for reference.     Microbiology: Recent Results (from the past 240 hour(s))  Urine culture     Status: Abnormal   Collection Time:  10/26/19 12:44 AM   Specimen: Urine, Clean Catch  Result Value Ref Range Status   Specimen Description   Final    URINE, CLEAN CATCH Performed at [facility], [address], [place], [place] [address]    Special Requests   Final    NONE Performed at [facility], [address], [place], [place] [address]    Culture MULTIPLE SPECIES PRESENT, SUGGEST RECOLLECTION (A)  Final   Report Status 10/27/2019 FINAL  Final  SARS Coronavirus 2 by RT PCR (hospital order, performed in [facility] hospital lab) Nasopharyngeal Nasopharyngeal Swab     Status: None   Collection Time: 10/26/19 12:44 AM   Specimen: Nasopharyngeal Swab  Result Value Ref Range Status   SARS Coronavirus 2 NEGATIVE NEGATIVE Final    Comment: (NOTE) SARS-CoV-2 target nucleic acids are NOT DETECTED.  The SARS-CoV-2 RNA is generally detectable in upper and lower respiratory specimens during the acute phase of infection. The lowest concentration of SARS-CoV-2 viral copies this assay can detect is 250 copies / mL. A  negative result does not preclude SARS-CoV-2 infection and should not be used as the sole basis for treatment or other patient management decisions.  A negative result may occur with improper specimen collection / handling, submission of specimen other than nasopharyngeal swab, presence of viral mutation(s) within the areas targeted by this assay, and inadequate number of viral copies (<250 copies / mL). A negative result must be combined with clinical observations, patient history, and epidemiological information.  Fact Sheet for Patients:   StrictlyIdeas.no  Fact Sheet for Healthcare Providers: BankingDealers.co.za  This test is not yet approved or  cleared by the [place] FDA and has been authorized for detection and/or diagnosis of SARS-CoV-2 by FDA under an Emergency Use Authorization (EUA).  This EUA will remain in effect (meaning this test can be used) for the duration of the COVID-19 declaration under Section 564(b)(1) of the Act, 21 U.S.C. section 360bbb-3(b)(1), unless the authorization is terminated or revoked sooner.  Performed at [facility], [address] [place], [place] [address]      Labs: BNP (last 3 results) No results for input(s): BNP in the last 8760 hours. Basic Metabolic Panel: Recent Labs  Lab 10/28/19 0415 10/29/19 0530 10/30/19 0432  NA 139 136 133*  K 3.8 4.3 4.3  CL 107 103 102  CO2 23 24 23   GLUCOSE 90 93 88  BUN 16 18 16   CREATININE 1.30* 1.36* 1.19  CALCIUM 9.1 8.9 8.6*  MG 1.4* 2.0  --    Liver Function Tests: Recent Labs  Lab 10/28/19 0415 10/29/19 0530  AST 23 24  ALT 14 16  ALKPHOS 43 45  BILITOT 0.6 0.4  PROT 6.8 6.8  ALBUMIN 3.0* 3.0*   No results for input(s): LIPASE, AMYLASE in the last 168 hours. No results for input(s): AMMONIA in the last 168 hours. CBC: No results for input(s): WBC, NEUTROABS, HGB, HCT, MCV, PLT in the last 168 hours. Cardiac Enzymes: No  results for input(s): CKTOTAL, CKMB, CKMBINDEX, TROPONINI in the last 168 hours. BNP: Invalid input(s): POCBNP CBG: No results for input(s): GLUCAP in the last 168 hours. D-Dimer No results for input(s): DDIMER in the last 72 hours. Hgb A1c No results for input(s): HGBA1C in the last 72 hours. Lipid Profile No results for input(s): CHOL, HDL, LDLCALC, TRIG, CHOLHDL, LDLDIRECT in the last 72 hours. Thyroid function studies No results for input(s): TSH, T4TOTAL, T3FREE, THYROIDAB in the last 72 hours.  Invalid input(s): FREET3 Anemia work up No results for input(s): VITAMINB12, FOLATE, FERRITIN, TIBC, IRON, RETICCTPCT in the last 72 hours. Urinalysis    Component Value Date/Time   COLORURINE YELLOW 10/26/2019 0044   APPEARANCEUR CLEAR 10/26/2019 0044   LABSPEC 1.013 10/26/2019 0044   PHURINE 5.0 10/26/2019 0044   GLUCOSEU NEGATIVE 10/26/2019 0044   HGBUR NEGATIVE 10/26/2019 0044   BILIRUBINUR NEGATIVE 10/26/2019 0044   KETONESUR 5 (A) 10/26/2019 0044   PROTEINUR NEGATIVE 10/26/2019 0044   UROBILINOGEN 1.0 05/23/2014 1425   NITRITE NEGATIVE 10/26/2019 0044   LEUKOCYTESUR NEGATIVE 10/26/2019 0044   Sepsis Labs Invalid input(s): PROCALCITONIN,  WBC,  LACTICIDVEN Microbiology Recent Results (from the past 240 hour(s))  Urine culture     Status: Abnormal   Collection Time: 10/26/19 12:44 AM   Specimen: Urine, Clean Catch  Result Value Ref Range Status   Specimen Description   Final    URINE, CLEAN CATCH Performed at [facility], [address] [place], [place] [address]    Special Requests   Final    NONE Performed at [facility], [address] [place], [place] [address]    Culture MULTIPLE SPECIES PRESENT, SUGGEST RECOLLECTION (A)  Final   Report Status 10/27/2019 FINAL  Final  SARS Coronavirus 2 by RT PCR (hospital order, performed in [facility] hospital lab) Nasopharyngeal Nasopharyngeal Swab     Status: None   Collection Time: 10/26/19 12:44 AM   Specimen:  Nasopharyngeal Swab  Result Value Ref Range Status   SARS Coronavirus 2 NEGATIVE NEGATIVE Final    Comment: (NOTE) SARS-CoV-2 target nucleic acids are NOT DETECTED.  The SARS-CoV-2 RNA is generally detectable in upper and lower respiratory specimens during the acute phase of infection. The lowest concentration of SARS-CoV-2 viral copies this assay can detect is 250 copies / mL. A negative result does not preclude SARS-CoV-2 infection and should not be used as the sole basis for treatment or other patient management decisions.  A negative result may occur with improper specimen collection / handling, submission of specimen other than nasopharyngeal swab, presence of viral mutation(s) within the areas targeted by this assay, and inadequate number of viral copies (<250 copies / mL). A negative result must be combined with clinical observations, patient history, and epidemiological information.  Fact Sheet for Patients:   StrictlyIdeas.no  Fact Sheet for Healthcare Providers: BankingDealers.co.za  This test is not yet approved or  cleared by the [place] FDA and has been authorized for detection and/or diagnosis of SARS-CoV-2 by FDA under an Emergency Use Authorization (EUA).  This EUA will remain in effect (meaning this test can be used) for the duration of the COVID-19 declaration under Section 564(b)(1) of the Act, 21 U.S.C. section 360bbb-3(b)(1), unless the authorization is terminated or revoked sooner.  Performed at [facility], [address], [place], [place] [address]      Time coordinating discharge:  I have spent 35 minutes face to face with the patient and on the ward discussing the patients care, assessment, plan and disposition with other care givers. >50% of the time was devoted counseling the patient about the risks and benefits of treatment/Discharge disposition and coordinating care.   SIGNED:   Damita Lack, MD  [facility] 11/03/2019, 10:54 AM   If 7PM-7AM, please contact night-coverage

## 2019-11-02 MED ORDER — MIDODRINE HCL 5 MG PO TABS: 5.0000 mg | ORAL_TABLET | Freq: Three times a day (TID) | ORAL | Status: DC

## 2019-11-02 MED ORDER — MIDODRINE HCL 5 MG PO TABS
5.0000 mg | ORAL_TABLET | Freq: Three times a day (TID) | ORAL | Status: DC
  Administered 2019-11-02 – 2019-11-03 (×4): 5 mg via ORAL
  Filled 2019-11-02 (×4): qty 1

## 2019-11-02 NOTE — Progress Notes (Signed)
PT Cancellation Note  Patient Details Name: Jeff [name] MRN: [id number] DOB: Aug 15, 1945   Cancelled Treatment:    Reason Eval/Treat Not Completed: Other (comment).  Pt was agreeable to his walk, but then when PT left room for a gown, his meal arrived.  Try again as time and pt allow.   Ramond Dial 11/02/2019, 5:14 PM   Mee Hives, PT MS Acute Rehab Dept. Number: [place] [facility] [place]

## 2019-11-02 NOTE — Progress Notes (Signed)
PROGRESS NOTE    Jeff [name]  HKV:[id number] DOB: 04/28/1945 DOA: 10/25/2019 PCP: Holley Bouche, NP (Inactive)   Brief Narrative:  74 year old male with a history of prostate cancer, cocaine use, bilateral hydronephrosis, B12 deficiency, CKD stage III presenting with slurred speech and facial droop. The patient is a poor historian. Apparently he had a friend that noticed that he was having speech difficulty and facial droop and activated EMS. Apparently this started on 10/24/2019. CT of the brain showed a hypodensity in the left basal ganglia consistent with subacute infarct in the putamen and caudate nucleus. BMP showed a serum creatinine of one-point 6 repeat LFTs were unremarkable. WBC 4.2, hemoglobin 10.2, platelets 201,000. The patient was admitted for further evaluation and treatment of his possible stroke. EDP contacted Dr. Cheral Marker.  Diagnosed with acute CVA, seen by neurology team.  PT recommended CIR.     Assessment & Plan:   Active Problems:   CVA (cerebral vascular accident) ([place])   Acute ischemic stroke ([place])   Cocaine abuse ([place])   CKD (chronic kidney disease) stage 3, GFR 30-59 ml/min   Acute metabolic encephalopathy   Prostate cancer metastatic to bone [facility])  Prostate cancer metastatic to bone ([facility])  Ischemic CVA-left caudate and putamen with patchy area affecting the insular region and frontal cortical/subcortical brain -PT/OT recommendations for CIR noted -CT brain--hypodensity left basal ganglia consistent with subacute infarct in the putamen and caudate nucleus -MRI brain confirms acute infarct affecting the L caudate and putamen with patchy areas of involvement affecting the insular region and frontoparietal cortical and subcortical brain -Carotid Duplex without stenosis -Echo reviewed. See below. New EF of 35% ntoed -LDL 134, to continue atorvastatin 40mg  daily -HbA1C 6.3 -Antiplatelet--aspirin 81 mg daily -Neurology was consulted. BP goal to  normalize to goal of <140/90 by discharge -Currently awaiting placement  CKD stage IIIb -Baseline creatinine 1.3  Metabolic encephalopathy, resolved -Unclear baseline -TSH of 1.083 -B12 of 1281 -Ammonia normal range -UA--no pyuria  Metastatic prostate cancer -Patient follows Dr. 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 Echo reviewed personally -Pt denies chest pain or sob. Currently euvolemic with no LE edema  -have discussed with Cardiology on call. Recommendation for ultimately as BP tolerates and follow up with Cardiology as outpatient. -Pt with soft bp, thus will hold off on ARB at this time.  Currently on midodrine, will increase to 5 mg 3 times daily. -Given cocaine abuse hx, would avoid beta blocker -Cessation has been done at bedside on a daily basis   DVT prophylaxis: Lovenox Code Status: Full Family Communication: None  Status is: Inpatient  Remains inpatient appropriate because:Unsafe d/c plan   Dispo: The patient is from: Home              Anticipated d/c is to: CIR              Anticipated d/c date is: 1 day              Patient currently is medically stable to d/c.  Awaiting CIR bed    Body mass index is 22.78 kg/m.   Subjective: Watching TV in his room, no complaints  Review of Systems Otherwise negative except as per HPI, including: General: Denies fever, chills,  night sweats or unintended weight loss. Resp: Denies cough, wheezing, shortness of breath. Cardiac: Denies chest pain, palpitations, orthopnea, paroxysmal nocturnal dyspnea. GI: Denies abdominal pain, nausea, vomiting, diarrhea or constipation GU: Denies  dysuria, frequency, hesitancy or incontinence MS: Denies muscle aches, joint pain or swelling Neuro: Denies headache, neurologic deficits (focal weakness, numbness, tingling), abnormal gait Psych: Denies anxiety, depression, SI/HI/AVH Skin: Denies new rashes or lesions ID: Denies sick contacts, exotic exposures, travel  Examination:  Constitutional: Not in acute distress Respiratory: Clear to auscultation bilaterally Cardiovascular: Normal sinus rhythm, no rubs Abdomen: Nontender nondistended good bowel sounds Musculoskeletal: No edema noted Skin: No rashes seen Neurologic: CN 2-12 grossly intact.  And nonfocal Psychiatric: Normal judgment and insight. Alert and oriented x 3. Normal mood.   Objective: Vitals:   11/01/19 1907 11/02/19 0022 11/02/19 0341 11/02/19 0827  BP: 99/80 110/63 (!) 88/50 93/63  Pulse: 61 76 70 72  Resp: 17 20 15 16   Temp: 98.7 F (37.1 C) 98.7 F (37.1 C) 98.4 F (36.9 C) 98 F (36.7 C)  TempSrc: Oral  Oral Oral  SpO2: 100% 95% 99% 96%  Weight:      Height:        Intake/Output Summary (Last 24 hours) at 11/02/2019 1037 Last data filed at 11/01/2019 1942 Gross per 24 hour  Intake 440 ml  Output 552 ml  Net -112 ml   Filed Weights   10/25/19 1312  Weight: 76.2 kg     Data Reviewed:   CBC: No results for input(s): WBC, NEUTROABS, HGB, HCT, MCV, PLT in the last 168 hours. Basic Metabolic Panel: Recent Labs  Lab 10/27/19 0415 10/28/19 0415 10/29/19 0530 10/30/19 0432  NA 141 139 136 133*  K 4.0 3.8 4.3 4.3  CL 110 107 103 102  CO2 21* 23 24 23   GLUCOSE 92 90 93 88  BUN 22 16 18 16   CREATININE 1.34* 1.30* 1.36* 1.19  CALCIUM 9.3 9.1 8.9 8.6*  MG  --  1.4* 2.0  --    GFR: Estimated Creatinine Clearance: 60.5 mL/min (by C-G formula based on SCr of 1.19 mg/dL). Liver Function Tests: Recent Labs  Lab 10/28/19 0415 10/29/19 0530  AST 23 24  ALT 14 16  ALKPHOS 43 45  BILITOT 0.6 0.4  PROT 6.8 6.8  ALBUMIN 3.0* 3.0*   No  results for input(s): LIPASE, AMYLASE in the last 168 hours. No results for input(s): AMMONIA in the last 168 hours. Coagulation Profile: No results for input(s): INR, PROTIME in the last 168 hours. Cardiac Enzymes: No results for input(s): CKTOTAL, CKMB, CKMBINDEX, TROPONINI in the last 168 hours. BNP (last 3 results) No results for input(s): PROBNP in the last 8760 hours. HbA1C: No results for input(s): HGBA1C in the last 72 hours. CBG: No results for input(s): GLUCAP in the last 168 hours. Lipid Profile: No results for input(s): CHOL, HDL, LDLCALC, TRIG, CHOLHDL, LDLDIRECT in the last 72 hours. Thyroid Function Tests: No results for input(s): TSH, T4TOTAL, FREET4, T3FREE, THYROIDAB in the last 72 hours. Anemia Panel: No results for input(s): VITAMINB12, FOLATE, FERRITIN, TIBC, IRON, RETICCTPCT in the last 72 hours. Sepsis Labs: No results for input(s): PROCALCITON, LATICACIDVEN in the last 168 hours.  Recent Results (from the past 240 hour(s))  Urine culture     Status: Abnormal   Collection Time: 10/26/19 12:44 AM   Specimen: Urine, Clean Catch  Result Value Ref Range Status   Specimen Description   Final    URINE, CLEAN CATCH Performed  at [facility], [address], [place], [place] [address]    Special Requests   Final    NONE Performed at [facility], [address], [place], [place] [address]    Culture MULTIPLE SPECIES PRESENT, SUGGEST RECOLLECTION (A)  Final   Report Status 10/27/2019 FINAL  Final  SARS Coronavirus 2 by RT PCR (hospital order, performed in [facility] hospital lab) Nasopharyngeal Nasopharyngeal Swab     Status: None   Collection Time: 10/26/19 12:44 AM   Specimen: Nasopharyngeal Swab  Result Value Ref Range Status   SARS Coronavirus 2 NEGATIVE NEGATIVE Final    Comment: (NOTE) SARS-CoV-2 target nucleic acids are NOT DETECTED.  The SARS-CoV-2 RNA is generally detectable in upper and lower respiratory specimens during the acute phase of  infection. The lowest concentration of SARS-CoV-2 viral copies this assay can detect is 250 copies / mL. A negative result does not preclude SARS-CoV-2 infection and should not be used as the sole basis for treatment or other patient management decisions.  A negative result may occur with improper specimen collection / handling, submission of specimen other than nasopharyngeal swab, presence of viral mutation(s) within the areas targeted by this assay, and inadequate number of viral copies (<250 copies / mL). A negative result must be combined with clinical observations, patient history, and epidemiological information.  Fact Sheet for Patients:   StrictlyIdeas.no  Fact Sheet for Healthcare Providers: BankingDealers.co.za  This test is not yet approved or  cleared by the [place] FDA and has been authorized for detection and/or diagnosis of SARS-CoV-2 by FDA under an Emergency Use Authorization (EUA).  This EUA will remain in effect (meaning this test can be used) for the duration of the COVID-19 declaration under Section 564(b)(1) of the Act, 21 U.S.C. section 360bbb-3(b)(1), unless the authorization is terminated or revoked sooner.  Performed at [facility], [address], [place], [place] [address]          Radiology Studies: No results found.      Scheduled Meds:   stroke: mapping our early stages of recovery book   Does not apply Once   aspirin EC  81 mg Oral Daily   atorvastatin  40 mg Oral Daily   Chlorhexidine Gluconate Cloth  6 each Topical Daily   enoxaparin (LOVENOX) injection  40 mg Subcutaneous QHS   midodrine  5 mg Oral TID WC   sodium chloride flush  10-40 mL Intracatheter Q12H   Continuous Infusions:   LOS: 8 days   Time spent= 20 mins    Jaelene Garciagarcia Arsenio Loader, MD [facility]  If 7PM-7AM, please contact night-coverage  11/02/2019, 10:37 AM

## 2019-11-02 NOTE — Progress Notes (Signed)
Inpatient Rehabilitation Admissions Coordinator  Insurance has approved and CIR bed is available Saturday. I met with patient at bedside and he is in agreement. I have contacted Dr. Reesa Chew, acute team and [facility] team . I will make the arrangements to admit Saturday. Saturday, Dr. Posey Pronto will see patient and make final approval to admit. [place] can call Rehab charge Nurse at 12 noon at [phone] to give report and make the final arrangements to admit .  Danne Baxter, RN, MSN Rehab Admissions Coordinator [phone] 11/02/2019 11:39 AM

## 2019-11-02 NOTE — Progress Notes (Signed)
PT Cancellation Note  Patient Details Name: Jeff [name] MRN: [id number] DOB: 28-Aug-1945   Cancelled Treatment:    Reason Eval/Treat Not Completed: Other (comment).  Pt was out of the room when PT attempted, will retry.   Ramond Dial 11/02/2019, 3:47 PM   Mee Hives, PT MS Acute Rehab Dept. Number: [place] [facility] [place]

## 2019-11-02 NOTE — Progress Notes (Signed)
Occupational Therapy Treatment Patient Details Name: Jeff [name] MRN: [id number] DOB: 1945-10-31 Today's Date: 11/02/2019    History of present illness 74yo male who began showing R sided weakness and facial droop on 6/30, brought to [place] on 7/1 and subsequently transferred to [facility] due to MRI showing acute L caudate and putamen CVA, also L M2 MCA occlusion. No tpa given, out of window. PMH acute renal failture, EtOH abuse, cocaine abuse, metastatic prostate CA, UTI, B nephrostomy   OT comments  Pt received supine in bed reporting no pain and agreeable to OT intervention. Overall, pt requires min guard for standing UB ADLs and supervision for LB ADLs from EOB. Pt completed functional mobility with RW and MINA for balance.  Pt with increased HR up to 132 bpm with pt needing a seated rest break during standing ADLs. Per chart review pt to DC to CIR later today, will follow acutely per POC.   Follow Up Recommendations  CIR    Equipment Recommendations  Other (comment) (defer to next facility)    Recommendations for Other Services    Precautions / Restrictions Precautions Precautions: Fall Restrictions Weight Bearing Restrictions: No       Mobility Bed Mobility Overal bed mobility: Needs Assistance Bed Mobility: Supine to Sit     Supine to sit: Supervision     General bed mobility comments: HOB elevated, no physical assist, supervision for safety  Transfers Overall transfer level: Needs assistance Equipment used: Rolling walker (2 wheeled) Transfers: Sit to/from Stand Sit to Stand: Min assist;Min guard         General transfer comment: min guard from EOB and MIN A topowr up from chair with no arm rests    Balance Overall balance assessment: Needs assistance Sitting-balance support: Feet supported Sitting balance-Leahy Scale: Good     Standing balance support: During functional activity;Single extremity supported Standing balance-Leahy Scale: Poor Standing  balance comment: at least one UE supported during standing ADL tasks                           ADL either performed or assessed with clinical judgement   ADL Overall ADL's : Needs assistance/impaired     Grooming: Oral care;Sitting;Standing;Min guard Grooming Details (indicate cue type and reason): min guard for balance while standing at sink with RW; pt tachy neeiding to sit down during standing ADL HR 132 bpm             Lower Body Dressing: Supervision/safety;Sitting/lateral leans Lower Body Dressing Details (indicate cue type and reason): to adjust socks from EOB Toilet Transfer: Minimal assistance;RW;Ambulation Toilet Transfer Details (indicate cue type and reason): simulated via functional mobility with RW; min A for balance and RW mgmt         Functional mobility during ADLs: Minimal assistance;Rolling walker General ADL Comments: pt tachy during session limiting further ADLs or mobility tasks     Vision   Additional Comments: WFL for ADL tasks, abel to lcoate all needed ADL items   Perception     Praxis      Cognition Arousal/Alertness: Awake/alert Behavior During Therapy: Flat affect Overall Cognitive Status: Impaired/Different from baseline Area of Impairment: Awareness;Safety/judgement;Problem solving                         Safety/Judgement: Decreased awareness of safety Awareness: Intellectual Problem Solving: Slow processing General Comments: pt continues to be very flat. mild safety impairments noted while  managing RW, slightly slow to process        Exercises     Shoulder Instructions       General Comments Pt HR increase to 132 bpm during mobility with pt needing seated rest break for HR to decrease to 90 bpm. BP pre mobility 95/68    Pertinent Vitals/ Pain       Pain Assessment: No/denies pain  Home Living                                          Prior Functioning/Environment               Frequency  Min 2X/week        Progress Toward Goals  OT Goals(current goals can now be found in the care plan section)  Progress towards OT goals: Progressing toward goals  Acute Rehab OT Goals Patient Stated Goal: get stronger, get home OT Goal Formulation: With patient Time For Goal Achievement: 11/10/19 Potential to Achieve Goals: Good  Plan Discharge plan remains appropriate;Frequency remains appropriate    Co-evaluation                 AM-PAC OT "6 Clicks" Daily Activity     Outcome Measure   Help from another person eating meals?: None Help from another person taking care of personal grooming?: A Little Help from another person toileting, which includes using toliet, bedpan, or urinal?: A Lot Help from another person bathing (including washing, rinsing, drying)?: A Lot Help from another person to put on and taking off regular upper body clothing?: A Little Help from another person to put on and taking off regular lower body clothing?: A Lot 6 Click Score: 16    End of Session Equipment Utilized During Treatment: Gait belt;Rolling walker  OT Visit Diagnosis: Unsteadiness on feet (R26.81);Muscle weakness (generalized) (M62.81);Other symptoms and signs involving cognitive function;Hemiplegia and hemiparesis Hemiplegia - Right/Left: Right Hemiplegia - dominant/non-dominant: Non-Dominant Hemiplegia - caused by: Cerebral infarction   Activity Tolerance Patient tolerated treatment well   Patient Left in chair;with call bell/phone within reach;with chair alarm set;with nursing/sitter in room   Nurse Communication Mobility status;Other (comment) (tachy during mobility)        Time: 0973-5329 OT Time Calculation (min): 18 min  Charges: OT General Charges $OT Visit: 1 Visit OT Treatments $Self Care/Home Management : 8-22 mins  Lanier Clam., COTA/L Acute Rehabilitation Services [phone]    Jeff [name] 11/02/2019, 10:56 AM

## 2019-11-02 NOTE — Progress Notes (Signed)
Pt's. HR up to 130's with activity while working with therapy. Pt. assessed and is asymptomatic. HR back in 90's with rest. Notified MD. No new orders. Will continue to monitor.

## 2019-11-02 NOTE — Plan of Care (Signed)
  Problem: Education: Goal: Knowledge of disease or condition will improve Outcome: Progressing   Problem: Self-Care: Goal: Ability to communicate needs accurately will improve Outcome: Progressing   Problem: Nutrition: Goal: Risk of aspiration will decrease Outcome: Progressing

## 2019-11-03 ENCOUNTER — Encounter (HOSPITAL_COMMUNITY): Payer: Self-pay | Admitting: Physical Medicine & Rehabilitation

## 2019-11-03 ENCOUNTER — Inpatient Hospital Stay (HOSPITAL_COMMUNITY)
Admission: RE | Admit: 2019-11-03 | Discharge: 2019-11-20 | DRG: 057 | Disposition: A | Discharge status: Skilled Nursing Facility | Payer: Medicare Other | Source: Intra-hospital | Attending: Physical Medicine & Rehabilitation | Admitting: Physical Medicine & Rehabilitation

## 2019-11-03 ENCOUNTER — Other Ambulatory Visit: Payer: Self-pay

## 2019-11-03 DIAGNOSIS — Z20822 Contact with and (suspected) exposure to covid-19: Secondary | ICD-10-CM | POA: Diagnosis present

## 2019-11-03 DIAGNOSIS — E871 Hypo-osmolality and hyponatremia: Secondary | ICD-10-CM | POA: Diagnosis not present

## 2019-11-03 DIAGNOSIS — Z743 Need for continuous supervision: Secondary | ICD-10-CM | POA: Diagnosis not present

## 2019-11-03 DIAGNOSIS — C61 Malignant neoplasm of prostate: Secondary | ICD-10-CM | POA: Diagnosis present

## 2019-11-03 DIAGNOSIS — N1832 Chronic kidney disease, stage 3b: Secondary | ICD-10-CM | POA: Diagnosis not present

## 2019-11-03 DIAGNOSIS — Z8546 Personal history of malignant neoplasm of prostate: Secondary | ICD-10-CM

## 2019-11-03 DIAGNOSIS — E538 Deficiency of other specified B group vitamins: Secondary | ICD-10-CM | POA: Diagnosis not present

## 2019-11-03 DIAGNOSIS — I5040 Unspecified combined systolic (congestive) and diastolic (congestive) heart failure: Secondary | ICD-10-CM | POA: Diagnosis not present

## 2019-11-03 DIAGNOSIS — D649 Anemia, unspecified: Secondary | ICD-10-CM | POA: Diagnosis not present

## 2019-11-03 DIAGNOSIS — I959 Hypotension, unspecified: Secondary | ICD-10-CM | POA: Diagnosis present

## 2019-11-03 DIAGNOSIS — I69351 Hemiplegia and hemiparesis following cerebral infarction affecting right dominant side: Secondary | ICD-10-CM | POA: Diagnosis not present

## 2019-11-03 DIAGNOSIS — Z23 Encounter for immunization: Secondary | ICD-10-CM

## 2019-11-03 DIAGNOSIS — I69328 Other speech and language deficits following cerebral infarction: Secondary | ICD-10-CM | POA: Diagnosis not present

## 2019-11-03 DIAGNOSIS — I951 Orthostatic hypotension: Secondary | ICD-10-CM | POA: Diagnosis not present

## 2019-11-03 DIAGNOSIS — F1410 Cocaine abuse, uncomplicated: Secondary | ICD-10-CM

## 2019-11-03 DIAGNOSIS — C7951 Secondary malignant neoplasm of bone: Secondary | ICD-10-CM | POA: Diagnosis not present

## 2019-11-03 DIAGNOSIS — D638 Anemia in other chronic diseases classified elsewhere: Secondary | ICD-10-CM

## 2019-11-03 DIAGNOSIS — I639 Cerebral infarction, unspecified: Secondary | ICD-10-CM | POA: Insufficient documentation

## 2019-11-03 DIAGNOSIS — N183 Chronic kidney disease, stage 3 unspecified: Secondary | ICD-10-CM | POA: Diagnosis present

## 2019-11-03 DIAGNOSIS — I5042 Chronic combined systolic (congestive) and diastolic (congestive) heart failure: Secondary | ICD-10-CM | POA: Diagnosis not present

## 2019-11-03 DIAGNOSIS — F17210 Nicotine dependence, cigarettes, uncomplicated: Secondary | ICD-10-CM | POA: Diagnosis present

## 2019-11-03 DIAGNOSIS — I5020 Unspecified systolic (congestive) heart failure: Secondary | ICD-10-CM

## 2019-11-03 DIAGNOSIS — M6281 Muscle weakness (generalized): Secondary | ICD-10-CM | POA: Diagnosis not present

## 2019-11-03 DIAGNOSIS — I429 Cardiomyopathy, unspecified: Secondary | ICD-10-CM

## 2019-11-03 DIAGNOSIS — R2681 Unsteadiness on feet: Secondary | ICD-10-CM | POA: Diagnosis not present

## 2019-11-03 DIAGNOSIS — D519 Vitamin B12 deficiency anemia, unspecified: Secondary | ICD-10-CM | POA: Diagnosis not present

## 2019-11-03 DIAGNOSIS — R279 Unspecified lack of coordination: Secondary | ICD-10-CM | POA: Diagnosis not present

## 2019-11-03 LAB — COMPREHENSIVE METABOLIC PANEL
ALT: 27 U/L (ref 0–44)
AST: 32 U/L (ref 15–41)
Albumin: 3 g/dL — ABNORMAL LOW (ref 3.5–5.0)
Alkaline Phosphatase: 47 U/L (ref 38–126)
Anion gap: 10 (ref 5–15)
BUN: 23 mg/dL (ref 8–23)
CO2: 23 mmol/L (ref 22–32)
Calcium: 8.7 mg/dL — ABNORMAL LOW (ref 8.9–10.3)
Chloride: 100 mmol/L (ref 98–111)
Creatinine, Ser: 1.47 mg/dL — ABNORMAL HIGH (ref 0.61–1.24)
GFR calc Af Amer: 54 mL/min — ABNORMAL LOW (ref >60)
GFR calc non Af Amer: 47 mL/min — ABNORMAL LOW (ref >60)
Glucose, Bld: 113 mg/dL — ABNORMAL HIGH (ref 70–99)
Potassium: 4.8 mmol/L (ref 3.5–5.1)
Sodium: 133 mmol/L — ABNORMAL LOW (ref 135–145)
Total Bilirubin: 1.2 mg/dL (ref 0.3–1.2)
Total Protein: 7 g/dL (ref 6.5–8.1)

## 2019-11-03 MED ORDER — HEPARIN SOD (PORK) LOCK FLUSH 100 UNIT/ML IV SOLN
500.0000 [IU] | INTRAVENOUS | Status: DC
  Administered 2019-11-03: 500 [IU]
  Filled 2019-11-03: qty 5

## 2019-11-03 MED ORDER — CHLORHEXIDINE GLUCONATE CLOTH 2 % EX PADS
6.0000 | MEDICATED_PAD | Freq: Every day | CUTANEOUS | Status: DC
  Administered 2019-11-03 – 2019-11-04 (×2): 6 via TOPICAL

## 2019-11-03 MED ORDER — POLYETHYLENE GLYCOL 3350 17 G PO PACK
17.0000 g | PACK | Freq: Every day | ORAL | Status: DC | PRN
  Administered 2019-11-04 – 2019-11-09 (×2): 17 g via ORAL
  Filled 2019-11-03 (×3): qty 1

## 2019-11-03 MED ORDER — PNEUMOCOCCAL VAC POLYVALENT 25 MCG/0.5ML IJ INJ
0.5000 mL | INJECTION | INTRAMUSCULAR | Status: AC
  Administered 2019-11-04: 0.5 mL via INTRAMUSCULAR
  Filled 2019-11-03: qty 0.5

## 2019-11-03 MED ORDER — ATORVASTATIN CALCIUM 40 MG PO TABS
40.0000 mg | ORAL_TABLET | Freq: Every day | ORAL | Status: DC
  Administered 2019-11-04 – 2019-11-20 (×17): 40 mg via ORAL
  Filled 2019-11-03 (×17): qty 1

## 2019-11-03 MED ORDER — MIDODRINE HCL 5 MG PO TABS
5.0000 mg | ORAL_TABLET | Freq: Three times a day (TID) | ORAL | Status: DC
  Administered 2019-11-03 – 2019-11-04 (×2): 5 mg via ORAL
  Filled 2019-11-03 (×2): qty 1

## 2019-11-03 MED ORDER — HEPARIN SOD (PORK) LOCK FLUSH 100 UNIT/ML IV SOLN
500.0000 [IU] | INTRAVENOUS | Status: DC | PRN
  Filled 2019-11-03: qty 5

## 2019-11-03 MED ORDER — SODIUM CHLORIDE 0.9% FLUSH
10.0000 mL | INTRAVENOUS | Status: DC | PRN
  Administered 2019-11-03: 10 mL

## 2019-11-03 MED ORDER — SIMETHICONE 80 MG PO CHEW: 80.0000 mg | CHEWABLE_TABLET | Freq: Four times a day (QID) | ORAL | Status: DC | PRN

## 2019-11-03 MED ORDER — MUSCLE RUB 10-15 % EX CREA
TOPICAL_CREAM | Freq: Two times a day (BID) | CUTANEOUS | Status: DC | PRN
  Filled 2019-11-03: qty 85

## 2019-11-03 MED ORDER — BISACODYL 10 MG RE SUPP: 10.0000 mg | Freq: Every day | RECTAL | Status: DC | PRN

## 2019-11-03 MED ORDER — ASPIRIN EC 81 MG PO TBEC
81.0000 mg | DELAYED_RELEASE_TABLET | Freq: Every day | ORAL | Status: DC
  Administered 2019-11-04 – 2019-11-20 (×17): 81 mg via ORAL
  Filled 2019-11-03 (×17): qty 1

## 2019-11-03 MED ORDER — ENOXAPARIN SODIUM 40 MG/0.4ML ~~LOC~~ SOLN
40.0000 mg | Freq: Every day | SUBCUTANEOUS | Status: DC
  Administered 2019-11-03 – 2019-11-19 (×17): 40 mg via SUBCUTANEOUS
  Filled 2019-11-03 (×17): qty 0.4

## 2019-11-03 MED ORDER — FAMOTIDINE 20 MG PO TABS: 10.0000 mg | ORAL_TABLET | Freq: Every day | ORAL | Status: DC | PRN

## 2019-11-03 MED ORDER — TROLAMINE SALICYLATE 10 % EX CREA: TOPICAL_CREAM | Freq: Two times a day (BID) | CUTANEOUS | Status: DC | PRN

## 2019-11-03 MED ORDER — FLEET ENEMA 7-19 GM/118ML RE ENEM: 1.0000 | ENEMA | Freq: Every day | RECTAL | Status: DC | PRN

## 2019-11-03 MED ORDER — ACETAMINOPHEN 325 MG PO TABS
650.0000 mg | ORAL_TABLET | Freq: Four times a day (QID) | ORAL | Status: DC | PRN
  Administered 2019-11-10 – 2019-11-18 (×4): 650 mg via ORAL
  Filled 2019-11-03 (×4): qty 2

## 2019-11-03 NOTE — H&P (Signed)
Physical Medicine and Rehabilitation Admission H&P    Chief Complaint  Patient presents with  . Stroke with functional deficits.    HPI:  Jeff [name] is a 74 year old male with history of metastatic prostate cancer, ETOH abuse, cocaine abuse who was admitted on 10/25/2019 with right facial droop, dysarthria, right hemiparesis and AMS.  History taken from chart review and patient.  Lase seen normal day before and SBP low at admission 90-110 range. UDS positive for cocaine.  MRI/MRA brain done revealing acute left caudate and putamen infarct, patchy areas of involvement in insular, fronto-parietal cortical and subcortical brain and mild swelling. 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  Carotid dopplers were negative for significant ICA stenosis.  Echocardiogram with ejection fraction of 35% with no LVH and boderline dilatation of aortic root. 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 BP remains soft and ProAmatine increased to 5 mg tid--avoid BB due to cocaine abuse hx. Patient with resultant dysarthria and anarthria, right sided weakness with numbness, right inattention and impulsivity. CIR recommended due to functional deficits.  Please see preadmission assessment from earlier today as well.  Review of Systems  Constitutional: Negative for chills and fever.  HENT: Negative for hearing loss and tinnitus.   Eyes: Negative for blurred vision and double vision.  Respiratory: Negative for cough and shortness of breath.   Cardiovascular: Negative for chest pain, palpitations and leg swelling.  Gastrointestinal: Negative for heartburn and nausea.  Genitourinary: Negative for dysuria and urgency.  Musculoskeletal: Negative for joint pain and myalgias.  Skin: Negative for itching and rash.  Neurological: Positive for speech change, focal weakness and weakness. Negative for sensory change.  Psychiatric/Behavioral: The patient does  not have insomnia.   All other systems reviewed and are negative.  Past Medical History:  Diagnosis Date  . Acute renal failure ([place]) 01/26/2014  . Alcohol abuse   . Anemia of chronic disease 01/26/2014  . Bilateral hydronephrosis 01/26/2014  . History of cocaine abuse ([place]) 01/26/2014  . Homelessness 01/31/2014  . Prostate cancer metastatic to multiple sites ([place]) 01/30/2014  . Sepsis ([place])   . UTI (lower urinary tract infection)     Past Surgical History:  Procedure Laterality Date  . ORCHIECTOMY Bilateral 02/05/2014   Procedure: BILATERAL ORCHIECTOMY;  Surgeon: Festus Aloe, MD;  Location: [facility];  Service: Urology;  Laterality: Bilateral;  . PERCUTANEOUS NEPHROSTOMY Bilateral    IR Dr. Junious Silk changed on 04/22/2014  . PORT-A-CATH REMOVAL Right 08/08/2017   Procedure: REMOVAL PORT-A-CATH RIGHT CHEST (PROCEDURE #2);  Surgeon: Aviva Signs, MD;  Location: [facility];  Service: General;  Laterality: Right;  . PORTACATH PLACEMENT Right 03/12/14  . PORTACATH PLACEMENT Left 08/08/2017   Procedure: INSERTION POWER PORT WITH ATTACHED CATHETER LEFT SUBCLAVIAN (PROCEDURE #1);  Surgeon: Aviva Signs, MD;  Location: [facility];  Service: General;  Laterality: Left;    Family History  Problem Relation Age of Onset  . Cancer Brother 52    Social History:   Lives alone. Does not drive. Manages home and cooks.  Has a friend who can check in after discharge. He reports that he has been smoking cigarettes--one pack/week.  He has a 13.75 pack-year smoking history. He has never used smokeless tobacco. He reports current alcohol use of about 1.0 beer per week. [name] Kitchen He reports current drug use. Drug: Cocaine--1-2 x week.     Allergies: No Known Allergies  Medications Prior to Admission  Medication Sig Dispense Refill  . calcium gluconate 500 MG tablet Take 1 tablet by mouth 2 (two) times daily.    . prochlorperazine (COMPAZINE) 10 MG tablet The day after chemo take 1 tab four times a day x 2 days. Then  may take 1 tab four times a day if needed for nausea/vomiting. 60 tablet 2    Drug Regimen Review  Drug regimen was reviewed and remains appropriate with no significant issues identified  Home: Home Living Family/patient expects to be discharged to:: Private residence Living Arrangements: Alone, Non-relatives/Friends Available Help at Discharge: Friend(s), Available PRN/intermittently Type of Home: Apartment Home Access: Elevator Home Layout: One level Bathroom Shower/Tub: Chiropodist: Standard Bathroom Accessibility: Yes Home Equipment: None  Lives With: Alone   Functional History: Prior Function Level of Independence: Independent Comments: Lives with friends; reports independence prior  Functional Status:  Mobility: Bed Mobility Overal bed mobility: Needs Assistance Bed Mobility: Supine to Sit Supine to sit: Supervision Sit to supine: Supervision, HOB elevated General bed mobility comments: HOB elevated, no physical assist, supervision for safety Transfers Overall transfer level: Needs assistance Equipment used: Rolling walker (2 wheeled) Transfers: Sit to/from Stand Sit to Stand: Min assist, Min guard General transfer comment: min guard from EOB and MIN A topowr up from chair with no arm rests Ambulation/Gait Ambulation/Gait assistance: Min assist, +2 safety/equipment (for chair follow) Gait Distance (Feet): 150 Feet Assistive device: Rolling walker (2 wheeled) Gait Pattern/deviations: Step-through pattern, Decreased stride length, Decreased dorsiflexion - right, Decreased weight shift to right General Gait Details: pt with noted R knee buckling requiring max verbal and tactile cues to extend R knee during stance phase, with onset of fatigue R knee buckling progressively worsening requiring modA to prevent fall, pt with better walker management today as well Gait velocity: dec Gait velocity interpretation: <1.31 ft/sec, indicative of household  ambulator    ADL: ADL Overall ADL's : Needs assistance/impaired Eating/Feeding: Set up, Sitting Grooming: Oral care, Sitting, Standing, Min guard Grooming Details (indicate cue type and reason): min guard for balance while standing at sink with RW; pt tachy neeiding to sit down during standing ADL HR 132 bpm Upper Body Bathing: Minimal assistance, Sitting Lower Body Bathing: Maximal assistance, Sitting/lateral leans, Sit to/from stand Upper Body Dressing : Minimal assistance, Sitting Lower Body Dressing: Supervision/safety, Sitting/lateral leans Lower Body Dressing Details (indicate cue type and reason): to adjust socks from EOB Toilet Transfer: Minimal assistance, RW, Ambulation Toilet Transfer Details (indicate cue type and reason): simulated via functional mobility with RW; min A for balance and RW mgmt Toileting- Clothing Manipulation and Hygiene: Minimal assistance Toileting - Clothing Manipulation Details (indicate cue type and reason): pt completed posterior care standing with minA Functional mobility during ADLs: Minimal assistance, Rolling walker General ADL Comments: pt tachy during session limiting further ADLs or mobility tasks  Cognition: Cognition Overall Cognitive Status: Impaired/Different from baseline Arousal/Alertness: Awake/alert Orientation Level: Oriented X4 Attention: Sustained Sustained Attention: Impaired Sustained Attention Impairment: Verbal basic Memory: Impaired Memory Impairment: Decreased recall of new information Awareness: Impaired Awareness Impairment: Intellectual impairment Problem Solving: Appears intact Safety/Judgment: Impaired Cognition Arousal/Alertness: Awake/alert Behavior During Therapy: Flat affect Overall Cognitive Status: Impaired/Different from baseline Area of Impairment: Awareness, Safety/judgement, Problem solving Following Commands: Follows one step commands consistently, Follows multi-step commands  inconsistently Safety/Judgement: Decreased awareness of safety Awareness: Intellectual Problem Solving: Slow processing General Comments: pt continues to be very flat. mild safety impairments noted while managing RW, slightly slow to process  Blood pressure 93/70, pulse 70, temperature 98.5 F (36.9 C), temperature source Oral, resp. rate 20, height 6' (1.829 m), weight 76.2 kg, SpO2 97 %. Physical Exam Vitals and nursing note reviewed.  Constitutional:      General: He is not in acute distress.    Comments: Thin male  HENT:     Head: Normocephalic and atraumatic.     Comments: Poor dentition    Right Ear: External ear normal.     Left Ear: External ear normal.     Nose: Nose normal.  Eyes:     General:        Right eye: No discharge.        Left eye: No discharge.     Extraocular Movements: Extraocular movements intact.  Cardiovascular:     Rate and Rhythm: Normal rate and regular rhythm.  Pulmonary:     Effort: Pulmonary effort is normal. No respiratory distress.     Breath sounds: No stridor.  Abdominal:     General: Abdomen is flat. There is no distension.  Musculoskeletal:     Cervical back: Normal range of motion and neck supple.     Comments: No edema or tenderness in extremities  Skin:    General: Skin is warm and dry.  Neurological:     Mental Status: He is alert and oriented to person, place, and time.     Comments: Right facial weakness Dysarthria Mild left inattention.  He is able to follow simple one step commands.  Motor: RUE: 4+/5 proximal distal RLE: 4+/5 proximal distal LUE/LLE: 5/5 proximal distal  Psychiatric:        Speech: Speech is delayed.        Behavior: Behavior is slowed.     No results found for this or any previous visit (from the past 48 hour(s)). No results found.     Medical Problem List and Plan: 1.  Dysarthria, right hemiparesis with numbness, right inattention and impulsivity secondary to acute left caudate and putamen  infarct with involvement in insular, fronto-parietal cortical and subcortical brain and mild swelling.   -patient may shower  -ELOS/Goals: 7-10 days/supervision/mod I  Admit to CIR 2.  Antithrombotics: -DVT/anticoagulation:  Pharmaceutical: Lovenox  -antiplatelet therapy: Low dose ASA 3. Pain Management:  4. Mood: LCSW to follow for evaluation and support.   -antipsychotic agents: N/a 5. Neuropsych: This patient is not fully capable of making decisions on his own behalf. 6. Skin/Wound Care: Routine pressure relief measures.  7. Fluids/Electrolytes/Nutrition: Monitor I/Os.  CMP ordered. 8. Hypotension: BP remains soft 80-100 range but asymptomatic. Will continue to monitor BP --continue ProAmatine for BP support. Increased to 5 mg tid.  Monitor with increased mobility  9. 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 Delton Coombes. Last infusion  06/23 10. CKD IIIb: Baseline SCr 1.7-1.8 range per records. Will continue to monitor.   CMP ordered. 11. Polysubstance abuse: Continue to counsel on cessation.  12. Anemia of chronic disease:  Will continue to monitor.   CBC ordered.  Bary Leriche, PA-C 11/03/2019  I have personally performed a face to face diagnostic evaluation, including, but not limited to relevant history and physical exam findings, of this patient and developed relevant assessment and plan.  Additionally, I have reviewed and concur with the physician assistant's documentation above.  Delice Lesch, MD, ABPMR  The patient's status has not changed. The original post admission physician evaluation remains appropriate, and any changes from the pre-admission  screening or documentation from the acute chart are noted above.   Delice Lesch, MD, ABPMR

## 2019-11-03 NOTE — Progress Notes (Signed)
Report given to 4W RN.

## 2019-11-03 NOTE — Progress Notes (Signed)
No acute events overnight.  Lying in the bed no complaints. Vital signs remained stable.  Awaiting transfer to CIR today.  Discussed with RN. Discharge summary completed on 7/8, updated today 7/10.  Call with further questions as needed.  Gerlean Ren MD [facility]

## 2019-11-03 NOTE — Progress Notes (Signed)
Patient arrived and oriented to unit routines. In no discomfort. No questions about rehab procedures. Resting comfortably with call bell in place

## 2019-11-04 ENCOUNTER — Inpatient Hospital Stay (HOSPITAL_COMMUNITY): Payer: Medicare Other | Admitting: Speech Pathology

## 2019-11-04 ENCOUNTER — Inpatient Hospital Stay (HOSPITAL_COMMUNITY): Payer: Medicare Other

## 2019-11-04 ENCOUNTER — Inpatient Hospital Stay (HOSPITAL_COMMUNITY): Payer: Medicare Other | Admitting: Occupational Therapy

## 2019-11-04 DIAGNOSIS — E871 Hypo-osmolality and hyponatremia: Secondary | ICD-10-CM

## 2019-11-04 DIAGNOSIS — I639 Cerebral infarction, unspecified: Secondary | ICD-10-CM

## 2019-11-04 DIAGNOSIS — I5042 Chronic combined systolic (congestive) and diastolic (congestive) heart failure: Secondary | ICD-10-CM

## 2019-11-04 DIAGNOSIS — D638 Anemia in other chronic diseases classified elsewhere: Secondary | ICD-10-CM

## 2019-11-04 DIAGNOSIS — N1832 Chronic kidney disease, stage 3b: Secondary | ICD-10-CM

## 2019-11-04 DIAGNOSIS — I951 Orthostatic hypotension: Secondary | ICD-10-CM

## 2019-11-04 LAB — CBC WITH DIFFERENTIAL/PLATELET
Abs Immature Granulocytes: 0.02 10*3/uL (ref 0.00–0.07)
Basophils Absolute: 0 10*3/uL (ref 0.0–0.1)
Basophils Relative: 0 %
Eosinophils Absolute: 0 10*3/uL (ref 0.0–0.5)
Eosinophils Relative: 0 %
HCT: 28.7 % — ABNORMAL LOW (ref 39.0–52.0)
Hemoglobin: 9.6 g/dL — ABNORMAL LOW (ref 13.0–17.0)
Immature Granulocytes: 0 %
Lymphocytes Relative: 25 %
Lymphs Abs: 1.2 10*3/uL (ref 0.7–4.0)
MCH: 30 pg (ref 26.0–34.0)
MCHC: 33.4 g/dL (ref 30.0–36.0)
MCV: 89.7 fL (ref 80.0–100.0)
Monocytes Absolute: 0.8 10*3/uL (ref 0.1–1.0)
Monocytes Relative: 18 %
Neutro Abs: 2.7 10*3/uL (ref 1.7–7.7)
Neutrophils Relative %: 57 %
Platelets: 247 10*3/uL (ref 150–400)
RBC: 3.2 MIL/uL — ABNORMAL LOW (ref 4.22–5.81)
RDW: 16.7 % — ABNORMAL HIGH (ref 11.5–15.5)
WBC: 4.7 10*3/uL (ref 4.0–10.5)
nRBC: 0 % (ref 0.0–0.2)

## 2019-11-04 MED ORDER — SODIUM CHLORIDE 0.9% FLUSH: 10.0000 mL | INTRAVENOUS | Status: DC | PRN

## 2019-11-04 MED ORDER — SODIUM CHLORIDE 0.9% FLUSH
10.0000 mL | Freq: Two times a day (BID) | INTRAVENOUS | Status: DC
  Administered 2019-11-04 – 2019-11-16 (×14): 10 mL

## 2019-11-04 MED ORDER — SODIUM CHLORIDE 0.9 % IV BOLUS
500.0000 mL | Freq: Once | INTRAVENOUS | Status: AC
  Administered 2019-11-04: 500 mL via INTRAVENOUS

## 2019-11-04 MED ORDER — SODIUM CHLORIDE 1 G PO TABS: 1.0000 g | ORAL_TABLET | Freq: Two times a day (BID) | ORAL | Status: DC

## 2019-11-04 MED ORDER — MIDODRINE HCL 5 MG PO TABS
10.0000 mg | ORAL_TABLET | Freq: Three times a day (TID) | ORAL | Status: DC
  Administered 2019-11-04 – 2019-11-08 (×12): 10 mg via ORAL
  Filled 2019-11-04 (×13): qty 2

## 2019-11-04 NOTE — Progress Notes (Signed)
Inpatient Rehabilitation Medication Review by a Pharmacist  A complete drug regimen review was completed for this patient to identify any potential clinically significant medication issues.  Clinically significant medication issues were identified:  yes   Type of Medication Issue Identified Description of Issue Urgent (address now) Non-Urgent (address on AM team rounds) Plan Plan Accepted by Provider? (Yes / No / Pending AM Rounds)  Drug Interaction(s) (clinically significant)        Duplicate Therapy       Allergy       No Medication Administration End Date       Incorrect Dose       Additional Drug Therapy Needed  -Chemotherapy (Cabazitaxel) due 7/14 (every 3 weeks, last on 10/18/19) -Compazine orders for after chemotherapy as needed (not yet ordered) Non-Urgent -Due 7/15 F/up plan as patient still expected to be in Rehab based on estimated LOS   Other  -Home calcium gluconate not resumed Non-Urgent Last Corrected Calcium 9.5 -within normal limits     Name of provider notified for urgent issues identified: n/a  Provider Method of Notification: n/a   For non-urgent medication issues to be resolved on team rounds tomorrow morning a [facility] Secure Chat Handoff was sent to: Dr. Delice Lesch   Pharmacist comments:   Time spent performing this drug regimen review (minutes):  15 minutes   Sloan Leiter, PharmD, BCPS, BCCCP Clinical Pharmacist Please refer to [facility] for [place] numbers 11/04/2019 7:28 AM

## 2019-11-04 NOTE — Evaluation (Signed)
Occupational Therapy Assessment and Plan  Patient Details  Name: Jeff [name] MRN: [id number] Date of Birth: Oct 14, 1945  OT Diagnosis: altered mental status, cognitive deficits and hemiplegia affecting dominant side Rehab Potential: Rehab Potential (ACUTE ONLY): Good ELOS: 7-10 days   Today's Date: 11/04/2019 OT Individual Time: 1102-1200 OT Individual Time Calculation (min): 58 min     Hospital Problem: Principal Problem:   Acute ischemic stroke ([place]) Active Problems:   Subcortical infarction ([place])   Chronic combined systolic and diastolic congestive heart failure ([place])   Hyponatremia   Past Medical History:  Past Medical History:  Diagnosis Date  . Acute renal failure ([place]) 01/26/2014  . Alcohol abuse   . Anemia of chronic disease 01/26/2014  . Bilateral hydronephrosis 01/26/2014  . History of cocaine abuse ([place]) 01/26/2014  . Homelessness 01/31/2014  . Prostate cancer metastatic to multiple sites ([place]) 01/30/2014  . Sepsis ([place])   . UTI (lower urinary tract infection)    Past Surgical History:  Past Surgical History:  Procedure Laterality Date  . ORCHIECTOMY Bilateral 02/05/2014   Procedure: BILATERAL ORCHIECTOMY;  Surgeon: Festus Aloe, MD;  Location: [facility];  Service: Urology;  Laterality: Bilateral;  . PERCUTANEOUS NEPHROSTOMY Bilateral    IR Dr. Junious Silk changed on 04/22/2014  . PORT-A-CATH REMOVAL Right 08/08/2017   Procedure: REMOVAL PORT-A-CATH RIGHT CHEST (PROCEDURE #2);  Surgeon: Aviva Signs, MD;  Location: [facility];  Service: General;  Laterality: Right;  . PORTACATH PLACEMENT Right 03/12/14  . PORTACATH PLACEMENT Left 08/08/2017   Procedure: INSERTION POWER PORT WITH ATTACHED CATHETER LEFT SUBCLAVIAN (PROCEDURE #1);  Surgeon: Aviva Signs, MD;  Location: [facility];  Service: General;  Laterality: Left;    Assessment & Plan Clinical Impression: Jeff [name] is a 74 year old male with history of metastatic prostate cancer, ETOH abuse, cocaine abuse who was  admitted on 10/25/2019 with right facial droop, dysarthria, right hemiparesis and AMS.  History taken from chart review and patient.  Last seen normal day before and SBP low at admission 90-110 range. UDS positive for cocaine.  MRI/MRA brain done revealing acute left caudate and putamen infarct, patchy areas of involvement in insular, fronto-parietal cortical and subcortical brain and mild swelling. 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  Carotid dopplers were negative for significant ICA stenosis.  Echocardiogram with ejection fraction of 35% with no LVH and boderline dilatation of aortic root. 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 BP remains soft and ProAmatine increased to 5 mg tid--avoid BB due to cocaine abuse hx. Patient with resultant dysarthria and anarthria, right sided weakness with numbness, right inattention and impulsivity. Patient transferred to CIR on 11/03/2019 .    Patient currently requires mod with basic self-care skills secondary to muscle weakness, impaired timing and sequencing, abnormal tone, unbalanced muscle activation, decreased coordination and decreased motor planning and decreased initiation, decreased attention, decreased awareness, decreased problem solving, decreased safety awareness and decreased memory.  Prior to hospitalization, patient could complete BADLs with independence.  Patient will benefit from skilled intervention to decrease level of assist with basic self-care skills and increase independence with basic self-care skills prior to discharge home independently.  Anticipate patient will require intermittent supervision and follow up home health.  OT - End of Session Activity Tolerance: Tolerates 30+ min activity with multiple rests Endurance Deficit: Yes OT Assessment Rehab Potential (ACUTE ONLY): Good OT Barriers to Discharge: Decreased caregiver support OT Barriers to Discharge Comments:  Patient has no immediate family nearby. Patient reports friends able to assist post d/c. OT Patient demonstrates impairments in the following area(s): Balance;Cognition;Endurance;Motor;Safety;Sensory OT Basic ADL's Functional Problem(s): Grooming;Bathing;Dressing;Toileting OT Transfers Functional Problem(s): Toilet;Tub/Shower OT Additional Impairment(s): Fuctional Use of Upper Extremity OT Plan OT Intensity: Minimum of 1-2 x/day, 45 to 90 minutes OT Frequency: 5 out of 7 days OT Duration/Estimated Length of Stay: 7-10 days OT Treatment/Interventions: Balance/vestibular training;Cognitive remediation/compensation;Community reintegration;Discharge planning;DME/adaptive equipment instruction;Functional mobility training;Neuromuscular re-education;Patient/family education;Self Care/advanced ADL retraining;Therapeutic Activities;Therapeutic Exercise;UE/LE Strength taining/ROM;UE/LE Coordination activities;Wheelchair propulsion/positioning OT Basic Self-Care Anticipated Outcome(s): Mod I OT Toileting Anticipated Outcome(s): Mod I OT Bathroom Transfers Anticipated Outcome(s): Mod I OT Recommendation Recommendations for Other Services: Speech consult;Neuropsych consult Patient destination: Home Follow Up Recommendations: Home health OT Equipment Recommended: To be determined   Skilled Therapeutic Intervention Patient met lying supine in bed. Process of inpatient rehab, goals and ELOS discussed with patient expressing verbal understanding. Patient completed bed mobility for supine to EOB transfer with CGA for safety and use of bed features. Sit to stand from EOB to RW with Min A cueing for hand placement with patient demonstrating some impulsivity. Patient performed UB bathing/dressing seated at sink level with Min A and LB bathing/dressing in sitting/standing at sink level with Mod A and cues for safety/sequencing. Session concluded with patient seated in recliner with call bell within reach, belt alarm  activated, and all needs met.    OT Evaluation Precautions/Restrictions  Precautions Precautions: Fall Precaution Comments: R weakness, inattention Restrictions Weight Bearing Restrictions: No General Chart Reviewed: Yes Family/Caregiver Present: No Vital Signs  Pain Pain Assessment Pain Scale: 0-10 Pain Score: 0-No pain Home Living/Prior Functioning Home Living Family/patient expects to be discharged to:: Private residence Living Arrangements: Alone Available Help at Discharge: Friend(s), Available PRN/intermittently Type of Home: Apartment (3rd floor) Home Access: Elevator Home Layout: One level Bathroom Shower/Tub: Optometrist: Yes  Lives With: Alone IADL History Homemaking Responsibilities: Yes Meal Prep Responsibility: Primary Laundry Responsibility: Primary Cleaning Responsibility: Primary Current License: No Mode of Transportation: Bus Occupation: Retired Type of Occupation: Maintenance Prior Function Level of Independence: Independent with basic ADLs, Independent with gait Driving: No Vocation: Retired ADL ADL Eating: Set up Where Assessed-Eating: Chair Grooming: Minimal assistance Where Assessed-Grooming: Sitting at sink Upper Body Bathing: Minimal assistance Where Assessed-Upper Body Bathing: Sitting at sink Lower Body Bathing: Moderate assistance Where Assessed-Lower Body Bathing: Sitting at sink Upper Body Dressing: Minimal assistance Where Assessed-Upper Body Dressing: Standing at sink Lower Body Dressing: Moderate assistance Where Assessed-Lower Body Dressing: Sitting at sink, Standing at sink Toileting: Minimal assistance Where Assessed-Toileting: Bedside Commode Vision Baseline Vision/History: No visual deficits Patient Visual Report: No change from baseline Vision Assessment?: Yes Eye Alignment: Within Functional Limits Ocular Range of Motion: Within Functional Limits Alignment/Gaze  Preference: Within Defined Limits Tracking/Visual Pursuits: Able to track stimulus in all quads without difficulty Saccades: Within functional limits Perception  Perception: Impaired Inattention/Neglect: Does not attend to right side of body Praxis Praxis: Intact Cognition Overall Cognitive Status: Impaired/Different from baseline Arousal/Alertness: Awake/alert Orientation Level: Person;Place Year: 2021 Month: July Day of Week: Incorrect Memory: Impaired Memory Impairment: Storage deficit;Retrieval deficit;Decreased recall of new information Immediate Memory Recall: Sock;Blue;Bed Memory Recall Sock: With Cue Memory Recall Blue: With Cue Memory Recall Bed: With Cue Attention: Sustained Sustained Attention: Appears intact Sustained Attention Impairment: Verbal basic Awareness: Impaired Awareness Impairment: Intellectual impairment Problem Solving: Impaired Problem Solving Impairment: Functional basic Executive Function: Self Monitoring;Self Correcting Self Monitoring: Impaired Self Monitoring Impairment:  Functional basic Self Correcting: Impaired Self Correcting Impairment: Functional basic Behaviors: Confabulation (Patient initially reports having a [place] aide 5x/wk. Later in session patient states that someone from his [place] comes out 1x monthly.) Safety/Judgment: Impaired Sensation Sensation Light Touch: Impaired by gross assessment (Impaired in LUE.) Hot/Cold: Appears Intact Proprioception: Appears Intact Coordination Gross Motor Movements are Fluid and Coordinated: No Fine Motor Movements are Fluid and Coordinated: No Finger Nose Finger Test: Impaired in RUE. Motor  Motor Motor: Hemiplegia Motor - Skilled Clinical Observations: R-sided weakness RUE>LLE. Mobility  Bed Mobility Bed Mobility: Sit to Supine Sit to Supine: Contact Guard/Touching assist Transfers Sit to Stand: Minimal Assistance - Patient > 75%  Trunk/Postural Assessment  Cervical  Assessment Cervical Assessment: Exceptions to [facility] (Forward head) Thoracic Assessment Thoracic Assessment: Exceptions to [facility] (Kyphotic) Lumbar Assessment Lumbar Assessment: Within Functional Limits Postural Control Postural Control: Deficits on evaluation (Decreased static/dynamic sitting balance.)  Balance Balance Balance Assessed: Yes Static Sitting Balance Static Sitting - Balance Support: No upper extremity supported;Feet supported Static Sitting - Level of Assistance: 5: Stand by assistance Dynamic Sitting Balance Dynamic Sitting - Balance Support: No upper extremity supported;Feet supported Dynamic Sitting - Level of Assistance: 5: Stand by assistance Dynamic Sitting - Balance Activities: Forward lean/weight shifting Sitting balance - Comments: During LB BADLs Static Standing Balance Static Standing - Balance Support: Bilateral upper extremity supported Static Standing - Level of Assistance: 4: Min assist Dynamic Standing Balance Dynamic Standing - Balance Support: Bilateral upper extremity supported;During functional activity Dynamic Standing - Level of Assistance: 3: Mod assist Extremity/Trunk Assessment RUE Assessment RUE Assessment: Exceptions to [facility] Passive Range of Motion (PROM) Comments: Limited shoulder flexion/abduction (likely baseline). Patient unable to recall previous dx. of shoulder injury. Elbow, wrist, digits WFL. Active Range of Motion (AROM) Comments: Limited active shoulder flexion. Elbow, wrist, and digits WFL. General Strength Comments: MMT grossly 4-/5. LUE Assessment LUE Assessment: Exceptions to [facility] Passive Range of Motion (PROM) Comments: PROM WFL Active Range of Motion (AROM) Comments: Slightly limited shoulder flexion/abduction. Wrist/elbow/digits WFL. General Strength Comments: MMT grossly 4-/5.     Refer to Care Plan for Long Term Goals  Recommendations for other services: Neuropsych   Discharge Criteria: Patient will be discharged from OT  if patient refuses treatment 3 consecutive times without medical reason, if treatment goals not met, if there is a change in medical status, if patient makes no progress towards goals or if patient is discharged from hospital.  The above assessment, treatment plan, treatment alternatives and goals were discussed and mutually agreed upon: by patient  Jamori Biggar R [place] 11/04/2019, 3:14 PM

## 2019-11-04 NOTE — Progress Notes (Addendum)
[place] PHYSICAL MEDICINE & REHABILITATION PROGRESS NOTE  Subjective/Complaints: Patient seen sitting up in bed eating breakfast this morning.  He states he slept well overnight.  He states he is ready begin therapies.  Discussed orthostatic blood pressures with nursing.  ROS: Denies CP, shortness of breath, nausea, vomiting, diarrhea.  Objective: Vital Signs: Blood pressure (!) 90/58, pulse 75, temperature 98.4 F (36.9 C), temperature source Oral, resp. rate 18, height 6' (1.829 m), weight 72.4 kg, SpO2 100 %. No results found. Recent Labs    11/04/19 0022  WBC 4.7  HGB 9.6*  HCT 28.7*  PLT 247   Recent Labs    11/03/19 2040  NA 133*  K 4.8  CL 100  CO2 23  GLUCOSE 113*  BUN 23  CREATININE 1.47*  CALCIUM 8.7*    Physical Exam: BP (!) 90/58 (BP Location: Left Arm)   Pulse 75   Temp 98.4 F (36.9 C) (Oral)   Resp 18   Ht 6' (1.829 m)   Wt 72.4 kg   SpO2 100%   BMI 21.65 kg/m  Constitutional: No distress . Vital signs reviewed.  Thin. HENT: Normocephalic.  Atraumatic.  Poor dentition. Eyes: EOMI. No discharge. Cardiovascular: No JVD. Respiratory: Normal effort.  No stridor. GI: Non-distended. Skin: Warm and dry.  Intact. Psych: Normal mood.  Normal behavior. Musc: No edema in extremities.  No tenderness in extremities. Neuro: Alert Right facial weakness Dysarthria Mild left inattention.  He is able to follow simple one step commands.  Motor: RUE: 4+/5 proximal distal, unchanged RLE: 4+/5 proximal distal LUE/LLE: 5/5 proximal distal   Assessment/Plan: 1. Functional deficits secondary to acute left caudate and putamen infarct which require 3+ hours per day of interdisciplinary therapy in a comprehensive inpatient rehab setting.  Physiatrist is providing close team supervision and 24 hour management of active medical problems listed below.  Physiatrist and rehab team continue to assess barriers to discharge/monitor patient progress toward functional  and medical goals  Care Tool:  Bathing              Bathing assist       Upper Body Dressing/Undressing Upper body dressing        Upper body assist      Lower Body Dressing/Undressing Lower body dressing            Lower body assist       Toileting Toileting    Toileting assist       Transfers Chair/bed transfer  Transfers assist           Locomotion Ambulation   Ambulation assist              Walk 10 feet activity   Assist           Walk 50 feet activity   Assist           Walk 150 feet activity   Assist           Walk 10 feet on uneven surface  activity   Assist           Wheelchair     Assist               Wheelchair 50 feet with 2 turns activity    Assist            Wheelchair 150 feet activity     Assist            Medical Problem List and Plan: 1.  Dysarthria, right hemiparesis with numbness, right inattention and impulsivity secondary to acute left caudate and putamen infarct with involvement in insular, fronto-parietal cortical and subcortical brain and mild swelling.   Begin CIR evaluations 2.  Antithrombotics: -DVT/anticoagulation:  Pharmaceutical: Lovenox             -antiplatelet therapy: Low dose ASA 3. Pain Management:  4. Mood: LCSW to follow for evaluation and support.              -antipsychotic agents: N/a 5. Neuropsych: This patient is ?fully capable of making decisions on his own behalf. 6. Skin/Wound Care: Routine pressure relief measures.  7. Fluids/Electrolytes/Nutrition: Monitor I/Os.  CMP ordered. 8. Hypotension: Continue to monitor BP   ProAmatine increased to 10 mg tid on 7/11  Symptomatic orthostasis with near syncopal episode on 7/11, requiring patient to return to bed  Teds/abdominal binder ordered  500 cc fluid bolus given, see #14-slow infusion given poor EF.  Will need to monitor for fluid overload closely.             Monitor with  increased mobility  9. 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 Delton Coombes. Last infusion  06/24  Discussed with pharmacy, appreciate input -due again 7/15.  Will follow up with oncology and Compazine post chemo orders if necessary 10. CKD IIIb: Baseline SCr 1.7-1.8 range per records. Will continue to monitor.              Creatinine 1.47 on 7/10, labs ordered for tomorrow 11. Polysubstance abuse: Continue to counsel on cessation.  12. Anemia of chronic disease:  Will continue to monitor.              Hemoglobin 9.6 on 7/11 13.  Hyponatremia  Sodium 133 on 7/10, labs ordered for tomorrow  May be contributing to #8  Twice daily salt tabs ordered 14.  Combined CHF Filed Weights   11/03/19 1534  Weight: 72.4 kg   Daily weights ordered  Echo reviewed-EF of 35%  LOS: 1 days A FACE TO FACE EVALUATION WAS PERFORMED  Courtnay Petrilla Lorie Phenix 11/04/2019, 8:44 AM

## 2019-11-04 NOTE — Evaluation (Signed)
Speech Language Pathology Assessment and Plan  Patient Details  Name: Jeff [name] MRN: [id number] Date of Birth: [birth year]  SLP Diagnosis: Dysarthria;Cognitive Impairments  Rehab Potential: Good ELOS: 7-10 days    Today's Date: 11/04/2019 SLP Individual Time: 0804-0900 SLP Individual Time Calculation (min): 56 min   Hospital Problem: Principal Problem:   Acute ischemic stroke [facility]) Active Problems:   Subcortical infarction (HCC)   Chronic combined systolic and diastolic congestive heart failure (HCC)   Hyponatremia  Past Medical History:  Past Medical History:  Diagnosis Date   Acute renal failure (HCC) 01/26/2014   Alcohol abuse    Anemia of chronic disease 01/26/2014   Bilateral hydronephrosis 01/26/2014   History of cocaine abuse (HCC) 01/26/2014   Homelessness 01/31/2014   Prostate cancer metastatic to multiple sites (HCC) 01/30/2014   Sepsis (HCC)    UTI (lower urinary tract infection)    Past Surgical History:  Past Surgical History:  Procedure Laterality Date   ORCHIECTOMY Bilateral 02/05/2014   Procedure: BILATERAL ORCHIECTOMY;  Surgeon: Jerilee Field, MD;  Location: [facility];  Service: Urology;  Laterality: Bilateral;   PERCUTANEOUS NEPHROSTOMY Bilateral    IR Dr. Mena Goes changed on 04/22/2014   PORT-A-CATH REMOVAL Right 08/08/2017   Procedure: REMOVAL PORT-A-CATH RIGHT CHEST (PROCEDURE #2);  Surgeon: Franky Macho, MD;  Location: [facility];  Service: General;  Laterality: Right;   PORTACATH PLACEMENT Right 03/12/14   PORTACATH PLACEMENT Left 08/08/2017   Procedure: INSERTION POWER PORT WITH ATTACHED CATHETER LEFT SUBCLAVIAN (PROCEDURE #1);  Surgeon: Franky Macho, MD;  Location: [facility];  Service: General;  Laterality: Left;    Assessment / Plan / Recommendation Clinical Impression   Jeff [name] a 74 y.o.right-handed malewith history of cocaine/alcohol/tobacco use, bilateral hydronephrosis with metastatic prostate cancer. Per chart review  patient lives with friends and independent prior to admission. 1 level apartment. Presented 10/25/2019 with slurred speech right side weakness and facial droop with altered mental status. CT/MRI showed acute infarction affecting the left caudate and putamen and with patchy areas of involvement affecting the insular region and frontoparietal cortical and subcortical brain. Mild swelling but no hemorrhage. 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 Carotid Dopplers with no significant stenosis. Echocardiogram with ejection fraction of 35% without embolism. Admission chemistries with alcohol negative, hemoglobin 10.2, BUN 28, creatinine 1.63, urine culture multiple species, urine drug screen positive cocaine. Patient is currently maintained on aspirin for CVA prophylaxis. Subcutaneous Lovenox for DVT prophylaxis. Tolerating a regular diet. Therapy evaluations completed with recommendations for CIR. Pt is to admit to CIR on Saturday 7/10 when bed is available.  SLP evaluation was completed on 11/04/19 with results as follows:  Pt presents with moderate cognitive deficits and scored below cut off scores on symbol cancellation, clock drawing, story retelling, and symbol trails subtests of the Cognitive-Linguistic Quick Test (CLQT) standardized cognitive assessment.  Remaining subtests were not administered due to time constraints related to toileting/hygiene needs at the beginning of evaluation (pt had been incontinent of bowel upon therapist's arrival, seemed unaware and had made no attempts to contact nursing) as well as pt's slowed processing and increased time needed to complete tasks.  I would suggest administering remaining portions during following treatment sessions as further diagnostic treatment of cognition.  Currently, pt requires mod-max assist to complete tasks due to the deficits mentioned above.  Pt also presents  with a mild dysarthria resulting from right sided oral motor weakness and decreased  vocal intensity which impacts articulatory precision and leads to decreased intelligibility at the phrase/short sentence level.  As a result, pt would benefit from skilled ST while inpatient in order to maximize functional independence and reduce burden of care prior to discharge.  Anticipate that pt will need 24/7 supervision at discharge in addition to [place] follow up at next level of care.     Skilled Therapeutic Interventions          Cognitive-linguistic evaluation completed with results and recommendations reviewed with patient.     SLP Assessment  Patient will need skilled [place] Pathology Services during CIR admission    Recommendations  Patient destination: Home Follow up Recommendations: Home Health SLP;24 hour supervision/assistance Equipment Recommended: None recommended by SLP    SLP Frequency 3 to 5 out of 7 days   SLP Duration  SLP Intensity  SLP Treatment/Interventions 7-10 days  Minumum of 1-2 x/day, 30 to 90 minutes  Cognitive remediation/compensation;Cueing hierarchy;Internal/external aids;Speech/Language facilitation;Patient/family education;Functional tasks    Pain Pain Assessment Pain Scale: 0-10 Pain Score: 0-No pain  Prior Functioning Cognitive/Linguistic Baseline: Information not available Type of Home: Apartment  Lives With: Alone Available Help at Discharge: Friend(s);Available PRN/intermittently Vocation: Retired  SLP Evaluation Cognition Overall Cognitive Status: Impaired/Different from baseline Arousal/Alertness: Awake/alert Orientation Level: Oriented to person;Oriented to place;Disoriented to time;Disoriented to situation Attention: Sustained Sustained Attention: Appears intact Memory: Impaired Memory Impairment: Storage deficit;Retrieval deficit;Decreased recall of new information Awareness: Impaired Awareness Impairment: Intellectual  impairment Problem Solving: Impaired Problem Solving Impairment: Functional basic Executive Function: Self Monitoring;Self Correcting Self Monitoring: Impaired Self Monitoring Impairment: Functional basic Self Correcting: Impaired Self Correcting Impairment: Functional basic Safety/Judgment: Impaired  Comprehension Auditory Comprehension Overall Auditory Comprehension: Appears within functional limits for tasks assessed Expression Expression Primary Mode of Expression: Verbal Verbal Expression Overall Verbal Expression: Appears within functional limits for tasks assessed Oral Motor Oral Motor/Sensory Function Overall Oral Motor/Sensory Function: Mild impairment Facial ROM: Reduced right;Suspected CN VII (facial) dysfunction Facial Symmetry: Abnormal symmetry right;Suspected CN VII (facial) dysfunction Facial Strength: Reduced right;Suspected CN VII (facial) dysfunction Facial Sensation: Within Functional Limits Lingual ROM: Reduced right Lingual Symmetry: Abnormal symmetry right Lingual Strength: Within Functional Limits Motor Speech Overall Motor Speech: Impaired Respiration: Within functional limits Phonation: Low vocal intensity Resonance: Within functional limits Articulation: Impaired Level of Impairment: Phrase Intelligibility: Intelligibility reduced Phrase: 75-100% accurate Motor Planning: Witnin functional limits      Short Term Goals: Week 1: SLP Short Term Goal 1 (Week 1): STG=LTG due to ELOS  Refer to Care Plan for Long Term Goals  Recommendations for other services: None   Discharge Criteria: Patient will be discharged from SLP if patient refuses treatment 3 consecutive times without medical reason, if treatment goals not met, if there is a change in medical status, if patient makes no progress towards goals or if patient is discharged from hospital.  The above assessment, treatment plan, treatment alternatives and goals were discussed and mutually  agreed upon: by patient  Emilio Math 11/04/2019, 12:34 PM

## 2019-11-04 NOTE — Evaluation (Signed)
Physical Therapy Assessment and Plan  Patient Details  Name: Jeff [name] MRN: [id number] Date of Birth: 02-04-1946  PT Diagnosis: Abnormal posture, Abnormality of gait, Cognitive deficits, Difficulty walking, Hemiplegia dominant, Impaired cognition and Muscle weakness Rehab Potential: Good ELOS: 8-12 days   Today's Date: 11/04/2019 PT Individual Time: 1304-1400 PT Individual Time Calculation (min): 56 min    Hospital Problem: Principal Problem:   Acute ischemic stroke ([place]) Active Problems:   Subcortical infarction ([place])   Chronic combined systolic and diastolic congestive heart failure ([place])   Hyponatremia   Past Medical History:  Past Medical History:  Diagnosis Date   Acute renal failure ([place]) 01/26/2014   Alcohol abuse    Anemia of chronic disease 01/26/2014   Bilateral hydronephrosis 01/26/2014   History of cocaine abuse ([place]) 01/26/2014   Homelessness 01/31/2014   Prostate cancer 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 Junious Silk changed on 04/22/2014   PORT-A-CATH REMOVAL Right 08/08/2017   Procedure: REMOVAL PORT-A-CATH RIGHT CHEST (PROCEDURE #2);  Surgeon: Aviva Signs, MD;  Location: [facility];  Service: General;  Laterality: Right;   PORTACATH PLACEMENT Right 03/12/14   PORTACATH PLACEMENT Left 08/08/2017   Procedure: INSERTION POWER PORT WITH ATTACHED CATHETER LEFT SUBCLAVIAN (PROCEDURE #1);  Surgeon: Aviva Signs, MD;  Location: [facility];  Service: General;  Laterality: Left;    Assessment & Plan Clinical Impression: Patient is a 74 y.o. year old male with history of metastatic prostate cancer, ETOH abuse,  cocaine abuse who was admitted on 10/25/2019 with right facial droop, dysarthria, right hemiparesis and AMS.  History taken from chart review and patient.  Lase seen normal day before and SBP low at admission 90-110 range. UDS positive for cocaine.  MRI/MRA brain done revealing acute left caudate and putamen infarct, patchy areas of involvement in insular, fronto-parietal cortical and subcortical brain and mild swelling. 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  Carotid dopplers were negative for significant ICA stenosis.  Echocardiogram with ejection fraction of 35% with no LVH and boderline dilatation of aortic root. 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 BP remains soft and ProAmatine increased to 5 mg tid--avoid BB due to cocaine abuse hx. Patient with resultant dysarthria and anarthria, right sided weakness with numbness, right inattention and impulsivity. CIR recommended due to functional deficits.  Please see preadmission assessment from earlier today as well. Patient transferred to CIR on 11/03/2019 .   Patient currently requires mod with mobility secondary to muscle weakness, decreased cardiorespiratoy endurance, decreased coordination, decreased visual motor skills, decreased midline orientation and decreased attention to right, decreased attention, decreased awareness, decreased safety awareness and decreased memory and decreased sitting balance, decreased standing balance, decreased postural control, hemiplegia and decreased balance strategies.  Prior to hospitalization, patient was independent  with mobility and lived with Alone in a Apartment (3rd floor) home.  Home access is  Elevator.  Patient will benefit from skilled PT intervention to maximize safe functional mobility, minimize fall risk and decrease caregiver burden for planned discharge home with intermittent assist.  Anticipate patient will benefit from follow up [facility] at  discharge.  PT - End of Session Activity Tolerance: Tolerates 30+ min activity with multiple rests Endurance Deficit: Yes PT Assessment Rehab Potential (ACUTE/IP ONLY): Good PT Barriers to Discharge: Decreased caregiver support;Lack of/limited family support;Behavior PT Barriers to Discharge Comments: Patient has a friend that can provide PRN supervision at d/c as primary caregiver, patient has poor incite into his deficits and R inattention PT Patient demonstrates impairments in the following area(s): Balance;Safety;Behavior;Sensory;Edema;Skin Integrity;Endurance;Motor;Nutrition;Pain;Perception PT Transfers Functional Problem(s): Bed Mobility;Bed to Chair;Car;Furniture;Floor PT Locomotion Functional Problem(s): Ambulation;Wheelchair Mobility;Stairs PT Plan PT Intensity: Minimum of 1-2 x/day ,45 to 90 minutes PT Frequency: 5 out of 7 days PT Duration Estimated Length of Stay: 8-12 days PT Treatment/Interventions: Ambulation/gait training;Community reintegration;DME/adaptive equipment instruction;Neuromuscular re-education;Psychosocial support;Stair training;UE/LE Strength taining/ROM;Wheelchair propulsion/positioning;Balance/vestibular training;Discharge planning;Pain management;Functional electrical stimulation;Skin care/wound management;Therapeutic Activities;UE/LE Coordination activities;Cognitive remediation/compensation;Disease management/prevention;Functional mobility training;Patient/family education;Splinting/orthotics;Therapeutic Exercise;Visual/perceptual remediation/compensation PT Transfers Anticipated Outcome(s): mod I using LRAD PT Locomotion Anticipated Outcome(s): mod I household, supervison community 150 ft using LRAD PT Recommendation Recommendations for Other Services: Neuropsych consult;Therapeutic Recreation consult Therapeutic Recreation Interventions: Stress management Follow Up Recommendations: Home health PT Patient destination: Home Equipment Recommended: To be  determined Equipment Details: Will determine appropriate DME needs based on patient's progress  Skilled Therapeutic Intervention In addition to the PT evaluation below, the patient performed the following skilled PT interventions: Patient in recliner with IV bolus running in the room upon PT arrival. Patient alert and agreeable to PT session. Patient denied pain during session.   Orthostatic Vitals: Sitting: BP 98/73, HR 77 (asymptomatic) Standing: BP 92/51, HR 108 (asymptomatic) Patient unable to maintain standing position x3 min due to decreased activity tolerance. Patient asymptomatic throughout remainder of session.    Therapeutic Activity: Bed Mobility: Patient performed rolling R/L and supine to/from sit with supervision-mod I on a mat table.  Transfers: Patient performed sit to/from stand x1 with mod A without AD due to significant posterior lean in standing. He performed squat pivot w/c<>mat table and w/c>bed with min A. He performed sit to/from stand x1 without AD and x1 with RW with min A. Provided verbal cues for forward weight shift for improved midline orientation and balance in standing and cues for hand placement with use of RW.  Gait Training:  Patient ambulated 25 feet without an AD with min A and 25 feet using RW with CGA-min A. Ambulated as described below. Provided verbal cues for erect posture, staying close to the RW for safety, and increased BOS on trial with RW. Patient ascended/descended 4 steps using B rails with min-mod A with increased assist on descent due to posterior lean. Performed step-to gait pattern leading with R throughout. Provided manual facilitation for bringing hands forward to prevent posterior LOB while descending stairs.  Wheelchair Mobility:  Patient propelled wheelchair 81 feet with supervision and required total A for remainder of distance throughout session due to decreased activity tolerance.   Patient in bed at end of session with breaks  locked, bed alarm set, and all needs within reach.   Instructed pt in results of PT evaluation as detailed below, PT POC, rehab potential, rehab goals, and discharge recommendations. Additionally discussed CIR's policies regarding fall safety and use of chair alarm and/or quick release belt. Pt verbalized understanding and in agreement. Will update pt's family members as they become available.    PT Evaluation Precautions/Restrictions Precautions Precautions: Fall Precaution Comments: R weakness UE>LE, R inattention, posterior lean in standing, low BP Restrictions Weight Bearing Restrictions: No Home Living/Prior Functioning Home Living Available Help at  Discharge: Friend(s);Available PRN/intermittently Type of Home: Apartment (3rd floor) Home Access: Elevator Home Layout: One level Bathroom Shower/Tub: Chiropodist: Standard Bathroom Accessibility: Yes  Lives With: Alone Prior Function Level of Independence: Independent with basic ADLs;Independent with gait;Independent with homemaking with ambulation  Able to Take Stairs?: Yes Driving: No Vocation: Retired 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, 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 Comments: Patient has a friend that can "check on him" at d/c, takes public transpertation Vision/Perception  Vision - Assessment Eye Alignment: Within Functional Limits Ocular Range of Motion: Within Functional Limits Alignment/Gaze Preference: Within Defined Limits Tracking/Visual Pursuits: Decreased smoothness of eye movement to LEFT superior field;Decreased smoothness of eye movement to LEFT inferior field;Requires cues, head turns, or add eye shifts to track Saccades: Decreased speed of saccadic movement;Additional eye shifts occurred during testing;Additional head turns occurred during testing Convergence: Impaired (comment) (>10 cm) Perception Perception: Impaired Inattention/Neglect: Does not attend to right visual field;Does not attend  to right side of body (intermittent and mild deficits) Praxis Praxis: Intact  Cognition Overall Cognitive Status: Impaired/Different from baseline Arousal/Alertness: Awake/alert Attention: Sustained Sustained Attention: Appears intact Memory: Impaired Memory Impairment: Storage deficit;Retrieval deficit;Decreased recall of new information Awareness: Impaired Awareness Impairment: Intellectual impairment Problem Solving: Impaired Problem Solving Impairment: Functional basic Executive Function: Self Monitoring;Self Correcting Self Monitoring: Impaired Self Monitoring Impairment: Functional basic Self Correcting: Impaired Self Correcting Impairment: Functional basic Safety/Judgment: Impaired Comments: Poor incite into deficits Sensation Sensation Light Touch: Appears Intact (Impaired in LUE.) Proprioception: Appears Intact Coordination Gross Motor Movements are Fluid and Coordinated: No Fine Motor Movements are Fluid and Coordinated: No Finger Nose Finger Test: Impaired in RUE with undershooting and increased time, slow and deliberate with L Heel Shin Test: [facility] Motor  Motor Motor: Hemiplegia Motor - Skilled Clinical Observations: R-sided weakness UE>LLE.  Mobility Bed Mobility Bed Mobility: Sit to Supine;Rolling Right;Rolling Left;Supine to Sit Rolling Right: Independent Rolling Left: Independent Supine to Sit: Supervision/Verbal cueing Sit to Supine: Supervision/Verbal cueing Transfers Transfers: Sit to Stand;Stand Pivot Transfers;Stand to Sit;Squat Pivot Transfers Sit to Stand: Moderate Assistance - Patient 50-74% Stand to Sit: Minimal Assistance - Patient > 75% Stand Pivot Transfers: Moderate Assistance - Patient 50 - 74% Squat Pivot Transfers: Moderate Assistance - Patient 50-74% Transfer (Assistive device): None Locomotion  Gait Ambulation: Yes Gait Assistance: Minimal Assistance - Patient > 75% Gait Distance (Feet): 25 Feet (limited by decreased activity  tolerance) Assistive device: None Gait Assistance Details: Manual facilitation for weight shifting Gait Assistance Details: manual facilitation for forward weight shift due to posterior bias Gait Gait: Yes Gait Pattern: Step-through pattern;Decreased stride length;Decreased hip/knee flexion - right;Decreased hip/knee flexion - left;Decreased trunk rotation;Narrow base of support Gait velocity: decreased Stairs / Additional Locomotion Stairs: Yes Stairs Assistance: Moderate Assistance - Patient 50 - 74% Stair Management Technique: Two rails Number of Stairs: 4 Height of Stairs: 6 Wheelchair Mobility Wheelchair Mobility: Yes Wheelchair Assistance: Moderate Assistance - Patient 50 - 74% Wheelchair Propulsion: Both upper extremities Wheelchair Parts Management: Needs assistance Distance: 150 feet (81 feet at supervison level, limited by fatigue)  Trunk/Postural Assessment  Cervical Assessment Cervical Assessment: Exceptions to [facility] (Forward head) Thoracic Assessment Thoracic Assessment: Exceptions to [facility] (Kyphotic) Lumbar Assessment Lumbar Assessment: Within Functional Limits Postural Control Postural Control: Deficits on evaluation (Decreased static/dynamic sitting balance.)  Balance Static Sitting Balance Static Sitting - Balance Support: No upper extremity supported;Feet supported Static Sitting - Level of Assistance: 5: Stand by assistance Dynamic Sitting Balance Dynamic Sitting - Balance Support: No upper extremity supported;Feet supported  Dynamic Sitting - Level of Assistance: 5: Stand by assistance Static Standing Balance Static Standing - Balance Support: No upper extremity supported;During functional activity Static Standing - Level of Assistance: 3: Mod assist Dynamic Standing Balance Dynamic Standing - Balance Support: No upper extremity supported;During functional activity Dynamic Standing - Level of Assistance: 3: Mod assist Extremity Assessment  RLE Assessment RLE  Assessment: Exceptions to [facility] Active Range of Motion (AROM) Comments: Grossly WFL for all functional mobility, decreased hamstring and heel cord flexability General Strength Comments: Grossly in sitting: hip flexion 4+/5, knee extension 4/5, knee flexion 4+/5, DF 4/5, PF4+/5 LLE Assessment LLE Assessment: Within Functional Limits Active Range of Motion (AROM) Comments: Grossly WFL for all functional mobility, decreased hamstring and heel cord flexability General Strength Comments: Grossly in sitting 5/5 throughout    Refer to Care Plan for Long Term Goals  Recommendations for other services: Neuropsych and Therapeutic Recreation  Stress management  Discharge Criteria: Patient will be discharged from PT if patient refuses treatment 3 consecutive times without medical reason, if treatment goals not met, if there is a change in medical status, if patient makes no progress towards goals or if patient is discharged from hospital.  The above assessment, treatment plan, treatment alternatives and goals were discussed and mutually agreed upon: by patient  Doreene Burke PT, DPT  11/04/2019, 5:24 PM

## 2019-11-04 NOTE — Progress Notes (Signed)
Orthopedic Tech Progress Note Patient Details:  Jeff [name] 27-Dec-1945 [id number]  Ortho Devices Type of Ortho Device: Abdominal binder Ortho Device/Splint Interventions: Ordered   Post Interventions Instructions Provided: Adjustment of device, Care of device, Poper ambulation with device Delivered abdominal binder to patient  Tammy Sours 11/04/2019, 7:40 AM

## 2019-11-05 ENCOUNTER — Inpatient Hospital Stay (HOSPITAL_COMMUNITY): Payer: Medicare Other

## 2019-11-05 ENCOUNTER — Inpatient Hospital Stay (HOSPITAL_COMMUNITY): Payer: Medicare Other | Admitting: Physical Therapy

## 2019-11-05 ENCOUNTER — Inpatient Hospital Stay (HOSPITAL_COMMUNITY): Payer: Medicare Other | Admitting: Occupational Therapy

## 2019-11-05 ENCOUNTER — Ambulatory Visit (HOSPITAL_COMMUNITY): Admission: RE | Admit: 2019-11-05 | Payer: Medicare Other | Source: Ambulatory Visit

## 2019-11-05 LAB — BASIC METABOLIC PANEL
Anion gap: 9 (ref 5–15)
BUN: 20 mg/dL (ref 8–23)
CO2: 23 mmol/L (ref 22–32)
Calcium: 8.1 mg/dL — ABNORMAL LOW (ref 8.9–10.3)
Chloride: 99 mmol/L (ref 98–111)
Creatinine, Ser: 1.65 mg/dL — ABNORMAL HIGH (ref 0.61–1.24)
GFR calc Af Amer: 47 mL/min — ABNORMAL LOW (ref >60)
GFR calc non Af Amer: 41 mL/min — ABNORMAL LOW (ref >60)
Glucose, Bld: 99 mg/dL (ref 70–99)
Potassium: 4.4 mmol/L (ref 3.5–5.1)
Sodium: 131 mmol/L — ABNORMAL LOW (ref 135–145)

## 2019-11-05 LAB — CBC
HCT: 26.7 % — ABNORMAL LOW (ref 39.0–52.0)
Hemoglobin: 8.8 g/dL — ABNORMAL LOW (ref 13.0–17.0)
MCH: 29.7 pg (ref 26.0–34.0)
MCHC: 33 g/dL (ref 30.0–36.0)
MCV: 90.2 fL (ref 80.0–100.0)
Platelets: 261 10*3/uL (ref 150–400)
RBC: 2.96 MIL/uL — ABNORMAL LOW (ref 4.22–5.81)
RDW: 16.8 % — ABNORMAL HIGH (ref 11.5–15.5)
WBC: 6.4 10*3/uL (ref 4.0–10.5)
nRBC: 0 % (ref 0.0–0.2)

## 2019-11-05 MED ORDER — SODIUM CHLORIDE 0.9 % IV BOLUS
500.0000 mL | Freq: Once | INTRAVENOUS | Status: AC
  Administered 2019-11-05: 500 mL via INTRAVENOUS

## 2019-11-05 MED ORDER — CHLORHEXIDINE GLUCONATE CLOTH 2 % EX PADS
6.0000 | MEDICATED_PAD | Freq: Two times a day (BID) | CUTANEOUS | Status: DC
  Administered 2019-11-05 – 2019-11-12 (×12): 6 via TOPICAL

## 2019-11-05 NOTE — Progress Notes (Signed)
Speech Language Pathology Daily Session Note  Patient Details  Name: Bristol Soy MRN: [id number] Date of Birth: 1945/09/18  Today's Date: 11/05/2019 SLP Individual Time: 7824-2353 SLP Individual Time Calculation (min): 58 min  Short Term Goals: Week 1: SLP Short Term Goal 1 (Week 1): STG=LTG due to ELOS  Skilled Therapeutic Interventions: Skilled ST services focused on cognitive skills. SLP facilitated continued assessment of cognitive linguistic skills, administering the Cognistat verse remaining portions of CLQT due to low performance. Pt demonstrated severe deficits in similarities, delayed recall, moderate deficits in comprehension, construction, and mild deficits in attention and calculation. Pt supports baseline impairments in memory deficits and demonstrated intermittent intellectual awareness of cognitive deficits. SLP also facilitated basic and functional problem solving utilizing ALFA money management task, pt required max A verbal cues for problem solving/error awareness and mod A for recall with aid. Pt was left in room with call bell within reach and bed alarm set. ST recommends to continue skilled ST services.      Pain Pain Assessment Pain Scale: 0-10 Pain Score: 0-No pain  Therapy/Group: Individual Therapy  Pooja Camuso  [facility] 11/05/2019, 7:57 AM

## 2019-11-05 NOTE — Progress Notes (Signed)
Izora Ribas, MD  Physician  Physical Medicine and Rehabilitation  Consult Note      Signed  Date of Service:  10/29/2019  5:39 AM      Related encounter: ED to Hosp-Admission (Discharged) from 10/25/2019 in [place] [facility]      Signed      Expand All Collapse All           Physical Medicine and Rehabilitation Consult Reason for Consult: Slurred speech with facial droop Referring Physician: [facility]     HPI: Jeff [name] is a 74 y.o. right-handed male with history of cocaine/alcohol/tobacco use, bilateral hydronephrosis with metastatic prostate cancer.  Per chart review patient lives with friends and independent prior to admission.  1 level apartment.  Presented 10/25/2019 with slurred speech right side weakness and facial droop with altered mental status.  CT/MRI showed acute infarction affecting the left caudate and putamen and with patchy areas of involvement affecting the insular region and frontoparietal cortical and subcortical brain.  Mild swelling but no hemorrhage.  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  Carotid Dopplers with no significant stenosis.  Echocardiogram with ejection fraction of 35% without embolism.  Admission chemistries with alcohol negative, hemoglobin 10.2, BUN 28, creatinine 1.63, urine culture multiple species, urine drug screen positive cocaine.  Patient is currently maintained on aspirin for CVA prophylaxis.  Subcutaneous Lovenox for DVT prophylaxis.  Tolerating a regular diet.  Therapy evaluations completed with recommendations of physical medicine rehab consult.     Review of Systems  Constitutional: Negative for chills and fever.  HENT: Negative for hearing loss.   Eyes: Negative for blurred vision and double vision.  Respiratory: Negative for shortness of breath.   Cardiovascular: Negative for chest pain and palpitations.  Gastrointestinal:  Positive for constipation. Negative for heartburn, nausea and vomiting.  Genitourinary: Negative for dysuria and flank pain.  Musculoskeletal: Positive for myalgias.  Skin: Negative for rash.  Neurological: Positive for speech change and weakness.  All other systems reviewed and are negative.       Past Medical History:  Diagnosis Date  . Acute renal failure ([place]) 01/26/2014  . Alcohol abuse    . Anemia of chronic disease 01/26/2014  . Bilateral hydronephrosis 01/26/2014  . History of cocaine abuse ([place]) 01/26/2014  . Homelessness 01/31/2014  . Prostate cancer metastatic to multiple sites ([place]) 01/30/2014  . Sepsis ([place])    . UTI (lower urinary tract infection)           Past Surgical History:  Procedure Laterality Date  . ORCHIECTOMY Bilateral 02/05/2014    Procedure: BILATERAL ORCHIECTOMY;  Surgeon: Festus Aloe, MD;  Location: [facility];  Service: Urology;  Laterality: Bilateral;  . PERCUTANEOUS NEPHROSTOMY Bilateral      IR Dr. Junious Silk changed on 04/22/2014  . PORT-A-CATH REMOVAL Right 08/08/2017    Procedure: REMOVAL PORT-A-CATH RIGHT CHEST (PROCEDURE #2);  Surgeon: Aviva Signs, MD;  Location: [facility];  Service: General;  Laterality: Right;  . PORTACATH PLACEMENT Right 03/12/14  . PORTACATH PLACEMENT Left 08/08/2017    Procedure: INSERTION POWER PORT WITH ATTACHED CATHETER LEFT SUBCLAVIAN (PROCEDURE #1);  Surgeon: Aviva Signs, MD;  Location: [facility];  Service: General;  Laterality: Left;         Family History  Problem Relation Age of Onset  . Cancer Brother 75    Social History:  reports that he has been smoking cigarettes. He  has a 13.75 pack-year smoking history. He has never used smokeless tobacco. He reports current alcohol use of about 1.0 standard drink of alcohol per week. He reports current drug use. Drug: Cocaine. Allergies: No Known Allergies       Medications Prior to Admission  Medication Sig Dispense Refill  . calcium gluconate 500 MG tablet Take 1 tablet by  mouth 2 (two) times daily.      . prochlorperazine (COMPAZINE) 10 MG tablet The day after chemo take 1 tab four times a day x 2 days. Then may take 1 tab four times a day if needed for nausea/vomiting. 60 tablet 2      Home: Home Living Family/patient expects to be discharged to:: Private residence Living Arrangements: Alone, Non-relatives/Friends Available Help at Discharge: Friend(s), Available PRN/intermittently Type of Home: Apartment Home Access: Elevator Home Layout: One level Bathroom Shower/Tub: Chiropodist: Standard Bathroom Accessibility: Yes Home Equipment: None  Lives With: Alone  Functional History: Prior Function Level of Independence: Independent Comments: Lives with friends; reports independence prior Functional Status:  Mobility: Bed Mobility Overal bed mobility: Needs Assistance Bed Mobility: Supine to Sit, Sit to Supine Supine to sit: Supervision, HOB elevated Sit to supine: Supervision, HOB elevated General bed mobility comments: S for safety and line management Transfers Overall transfer level: Needs assistance Equipment used: Rolling walker (2 wheeled) Transfers: Sit to/from Stand Sit to Stand: Mod assist General transfer comment: Pt modA overall for power up and for initial standing balance Ambulation/Gait Ambulation/Gait assistance: Mod assist Gait Distance (Feet): 20 Feet Assistive device: Rolling walker (2 wheeled) Gait Pattern/deviations: Step-to pattern, Decreased step length - left, Decreased stance time - right, Decreased dorsiflexion - right, Decreased weight shift to right, Drifts right/left, Trunk flexed, Wide base of support General Gait Details: very wide BOS with poor weight shifting R, kept trying to push RW far too far away from him, unsteady to R, and with poor attention on R side with max cues to avoid running into obstacles Gait velocity: decreased   ADL: ADL Overall ADL's : Needs  assistance/impaired Eating/Feeding: Set up, Sitting Grooming: Minimal assistance, Standing Grooming Details (indicate cue type and reason): standing at sink x5 mins for light grooming; assist with opening toothbrush plastic; able to open and close toothpaste tube top Upper Body Bathing: Minimal assistance, Sitting Lower Body Bathing: Maximal assistance, Sitting/lateral leans, Sit to/from stand Upper Body Dressing : Minimal assistance, Sitting Lower Body Dressing: Maximal assistance, Sitting/lateral leans, Sit to/from stand, Cueing for safety Lower Body Dressing Details (indicate cue type and reason): MaxA overall for LB dressing; RUE unable to grip sock Toilet Transfer: Moderate assistance, Ambulation, Cueing for safety, Cueing for sequencing Toileting- Clothing Manipulation and Hygiene: Maximal assistance, Cueing for safety, Cueing for sequencing, Sitting/lateral lean, Sit to/from stand Functional mobility during ADLs: Moderate assistance, Cueing for safety, Cueing for sequencing, Rolling walker General ADL Comments: Pt limited by decreased cognition, decreased expressive deficits, decreased mobility and decreased ability to care for self.   Cognition: Cognition Overall Cognitive Status: Impaired/Different from baseline Arousal/Alertness: Awake/alert Orientation Level: Oriented to person, Oriented to place, Disoriented to time, Disoriented to situation Attention: Sustained Sustained Attention: Impaired Sustained Attention Impairment: Verbal basic Memory: Impaired Memory Impairment: Decreased recall of new information Awareness: Impaired Awareness Impairment: Intellectual impairment Problem Solving: Appears intact Safety/Judgment: Impaired Cognition Arousal/Alertness: Awake/alert Behavior During Therapy: Flat affect Overall Cognitive Status: Impaired/Different from baseline Area of Impairment: Safety/judgement, Awareness, Problem solving Safety/Judgement: Decreased awareness of  safety, Decreased awareness of deficits Awareness:  Intellectual Problem Solving: Slow processing, Decreased initiation, Requires verbal cues, Requires tactile cues General Comments: Pt with expressive difficulties s/p CVA. Pt following 1 step commands consistently. Pt requires cues to use RW and perform step to gait pattern safely.    Blood pressure 94/62, pulse 70, temperature 98 F (36.7 C), temperature source Oral, resp. rate 14, height 6' (1.829 m), weight 76.2 kg, SpO2 100 %. Physical Exam   General: Alert and oriented x 3, No apparent distress HEENT: Head is normocephalic, atraumatic, PERRLA, EOMI, sclera anicteric, oral mucosa pink and moist, dentition intact, ext ear canals clear,  Neck: Supple without JVD or lymphadenopathy Heart: Reg rate and rhythm. No murmurs rubs or gallops Chest: CTA bilaterally without wheezes, rales, or rhonchi; no distress Abdomen: Soft, non-tender, non-distended, bowel sounds positive. Extremities: No clubbing, cyanosis, or edema. Pulses are 2+ Skin: Clean and intact without signs of breakdown Neuro: Patient is alert in no acute distress.  Makes eye contact with examiner.  Speech is dysarthric but intelligible.  Right favial droop. Provides his name and age but needed some cues for year.  Follows simple commands. 5/5 strength on left, 4/5 on right Psych: Pt's affect is appropriate. Pt is cooperative      Lab Results Last 24 Hours  No results found for this or any previous visit (from the past 24 hour(s)).   Imaging Results (Last 48 hours)  No results found.       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 Does the need for close, 24 hr/day medical supervision in concert with the patient's rehab needs make it unreasonable for this patient to be served in a less intensive setting? Yes 2. Co-Morbidities requiring supervision/potential complications: cocaine abuse, prostate cancer, CKD, chronic anemia, tobacco use, ETOH  abuse, dysarthria, right hemiparesis 3. Due to bladder management, bowel management, safety, skin/wound care, disease management, medication administration, pain management and patient education, does the patient require 24 hr/day rehab nursing? Yes 4. Does the patient require coordinated care of a physician, rehab nurse, therapy disciplines of PT, OT, SLP to address physical and functional deficits in the context of the above medical diagnosis(es)? Yes Addressing deficits in the following areas: balance, endurance, locomotion, strength, transferring, bowel/bladder control, bathing, dressing, feeding, grooming, toileting, cognition, speech and psychosocial support 5. Can the patient actively participate in an intensive therapy program of at least 3 hrs of therapy per day at least 5 days per week? Yes 6. The potential for patient to make measurable gains while on inpatient rehab is good 7. Anticipated functional outcomes upon discharge from inpatient rehab are modified independent  with PT, supervision with OT, supervision with SLP. 8. Estimated rehab length of stay to reach the above functional goals is: 10-14 days 9. Anticipated discharge destination: Home 10. Overall Rehab/Functional Prognosis: good   RECOMMENDATIONS: This patient's condition is appropriate for continued rehabilitative care in the following setting: CIR Patient has agreed to participate in recommended program. Yes Note that insurance prior authorization may be required for reimbursement for recommended care.   Comment: [name] would be a good CIR candidate once medically stable (BP currently with 68T systolic). He has no family support and will likely require intermittent supervision. His friend [name] [phone]) has been visiting patient and can help to provide intermittent supervision.    Lavon Paganini Angiulli, PA-C 10/29/2019    I have personally performed a face to face diagnostic evaluation, including, but not limited  to relevant history and physical exam findings,  of this patient and developed relevant assessment and plan.  Additionally, I have reviewed and concur with the physician assistant's documentation above.   Leeroy Cha, MD        Revision History                     Routing History           Note Details  Author Ranell Patrick, Clide Deutscher, MD File Time 10/29/2019 11:14 AM  Author Type Physician Status Signed  Last Editor Izora Ribas, MD Service Physical Medicine and [place] # [id number] Admit Date 11/03/2019

## 2019-11-05 NOTE — Progress Notes (Signed)
Jamse Arn, MD  Physician  Physical Medicine and Rehabilitation  PMR Pre-admission      Addendum  Date of Service:  10/29/2019  1:48 PM      Related encounter: ED to Hosp-Admission (Discharged) from 10/25/2019 in [facility]       PMR Admission Coordinator Pre-Admission Assessment   Patient: Jeff [name] is an 74 y.o., male MRN: [id number] DOB: Sep 26, 1945 Height: 6' (182.9 cm) Weight: 76.2 kg                                                                                                                                                  Insurance Information HMO: yes    PPO:      PCP:      IPA:      80/20:      OTHER:  PRIMARY: UHC Medicare      Policy#: [id number]      Subscriber: patient CM Name: Wells Guiles  [facility] [place]  ELFYB#:[phone]     Fax#: [phone] Pre-Cert#: [id number] navi number [id number] Approved for 7 days with f/u 7/14   Employer:    Benefits:  Phone #: online      Name: [email or web address] provider portal Eff. Date: 04/27/19 -still active     Deduct: does not have ($0)      Out of Pocket Max: $7,550 ($0 met)      Life Max: NA  CIR: $1,400/admission co-pay      SNF: $0/day co-pay for days 1-20, $180.50/day copay for days 21-100; limited to 100 days/cal yr Outpatient: 80% coverage, 20% co-insurance; limited by medical necessity      Home Health: 100% coverage, 0% co-insurance; limited by medical necessity     DME: 80% coverage, 20% co-insurance   Providers:  SECONDARY: Medicaid [place]      Policy#: [id number]      Phone#: verified eligibility online via [employer] on 11/01/19 [facility] coverage code).    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 right-handed male with history of cocaine/alcohol/tobacco use, bilateral hydronephrosis with metastatic prostate cancer.  Per chart review patient lives with friends and independent prior to admission.  1 level apartment.  Presented 10/25/2019 with slurred speech right side weakness and facial droop with altered mental status.  CT/MRI showed acute infarction affecting the  left caudate and putamen and with patchy areas of involvement affecting the insular region and frontoparietal cortical and subcortical brain.  Mild swelling but no hemorrhage.  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  Carotid Dopplers with no significant stenosis.  Echocardiogram with ejection fraction of 35% without embolism.  Admission chemistries with alcohol negative, hemoglobin 10.2, BUN 28, creatinine 1.63, urine culture multiple species, urine drug screen positive cocaine.  Patient is currently maintained on aspirin for CVA prophylaxis.  Subcutaneous Lovenox for DVT prophylaxis.  Tolerating a regular diet.  Therapy evaluations completed with recommendations for CIR. Pt is to admit to CIR on Saturday 7/10 when bed is available.      Complete NIHSS TOTAL: 0 Glasgow Coma Scale Score: 15   Past Medical History      Past Medical History:  Diagnosis Date  . Acute renal failure ([place]) 01/26/2014  . Alcohol abuse    . Anemia of chronic disease 01/26/2014  . Bilateral hydronephrosis 01/26/2014  . History of cocaine abuse ([place]) 01/26/2014  . Homelessness 01/31/2014  . Prostate cancer metastatic to multiple sites ([place]) 01/30/2014  . Sepsis ([place])    . UTI (lower urinary tract infection)        Family History  family history includes Cancer (age of onset: 81) in his  brother.   Prior Rehab/Hospitalizations:  Has the patient had prior rehab or hospitalizations prior to admission? No   Has the patient had major surgery during 100 days prior to admission? No   Current Medications    Current Facility-Administered Medications:  .   stroke: mapping our early stages of recovery book, , Does not apply, Once, Emokpae, Ejiroghene E, MD .  acetaminophen (TYLENOL) tablet 650 mg, 650 mg, Oral, Q4H PRN **OR** acetaminophen (TYLENOL) 160 MG/5ML solution 650 mg, 650 mg, Per Tube, Q4H PRN **OR** acetaminophen (TYLENOL) suppository 650 mg, 650 mg, Rectal, Q4H PRN, Emokpae, Ejiroghene E, MD .  aspirin EC tablet 81 mg, 81 mg, Oral, Daily, Emokpae, Ejiroghene E, MD, 81 mg at 11/02/19 1041 .  atorvastatin (LIPITOR) tablet 40 mg, 40 mg, Oral, Daily, Emokpae, Ejiroghene E, MD, 40 mg at 11/02/19 1041 .  Chlorhexidine Gluconate Cloth 2 % PADS 6 each, 6 each, Topical, Daily, Tat, Alyxander, MD, 6 each at 11/02/19 1041 .  enoxaparin (LOVENOX) injection 40 mg, 40 mg, Subcutaneous, QHS, Emokpae, Ejiroghene E, MD, 40 mg at 11/01/19 2108 .  midodrine (PROAMATINE) tablet 5 mg, 5 mg, Oral, TID WC, Amin, Ankit Chirag, MD .  senna-docusate (Senokot-S) tablet 1 tablet, 1 tablet, Oral, QHS PRN, Emokpae, Ejiroghene E, MD .  sodium chloride flush (NS) 0.9 % injection 10-40 mL, 10-40 mL, Intracatheter, Q12H, Tat, Lydon, MD, 10 mL at 11/02/19 1041 .  sodium chloride flush (NS) 0.9 % injection 10-40 mL, 10-40 mL, Intracatheter, PRN, Tat, Abad, MD   Facility-Administered Medications Ordered in Other Encounters:  .  cyanocobalamin ((VITAMIN B-12)) injection 1,000 mcg, 1,000 mcg, Intramuscular, Once, Kefalas, Thomas S, PA-C   Patients Current Diet:  Diet Order                  Diet regular Room service appropriate? Yes; Fluid consistency: Thin  Diet effective now                         Precautions / Restrictions Precautions Precautions: Fall Precaution Comments: R weakness,  inattention Restrictions Weight Bearing  Restrictions: No    Has the patient had 2 or more falls or a fall with injury in the past year?No   Prior Activity Level   Prior Functional Level Prior Function Level of Independence: Independent Comments: Lives with friends; reports independence prior   Self Care: Did the patient need help bathing, dressing, using the toilet or eating?  Independent   Indoor Mobility: Did the patient need assistance with walking from room to room (with or without device)? Independent   Stairs: Did the patient need assistance with internal or external stairs (with or without device)? Independent   Functional Cognition: Did the patient need help planning regular tasks such as shopping or remembering to take medications? Independent   Home Assistive Devices / Equipment Home Equipment: None   Prior Device Use: Indicate devices/aids used by the patient prior to current illness, exacerbation or injury? None of the above   Current Functional Level Cognition   Arousal/Alertness: Awake/alert Overall Cognitive Status: Impaired/Different from baseline Orientation Level: Oriented X4 Following Commands: Follows one step commands consistently, Follows multi-step commands inconsistently Safety/Judgement: Decreased awareness of safety General Comments: pt continues to be very flat. mild safety impairments noted while managing RW, slightly slow to process Attention: Sustained Sustained Attention: Impaired Sustained Attention Impairment: Verbal basic Memory: Impaired Memory Impairment: Decreased recall of new information Awareness: Impaired Awareness Impairment: Intellectual impairment Problem Solving: Appears intact Safety/Judgment: Impaired    Extremity Assessment (includes Sensation/Coordination)   Upper Extremity Assessment: Generalized weakness RUE Deficits / Details: shoulder 2/5 MM grade; elbow through digits 2 to 2+/5 mm grade RUE Sensation: decreased  light touch RUE Coordination: decreased fine motor, decreased gross motor LUE Deficits / Details: AROM, WFLs. 4/5 MM grade  Lower Extremity Assessment: Defer to PT evaluation RLE Deficits / Details: weaknesss RLE Sensation: decreased light touch RLE Coordination: decreased gross motor, decreased fine motor LLE Deficits / Details: WNL LLE Sensation: WNL LLE Coordination: WNL     ADLs   Overall ADL's : Needs assistance/impaired Eating/Feeding: Set up, Sitting Grooming: Oral care, Sitting, Standing, Min guard Grooming Details (indicate cue type and reason): min guard for balance while standing at sink with RW; pt tachy neeiding to sit down during standing ADL HR 132 bpm Upper Body Bathing: Minimal assistance, Sitting Lower Body Bathing: Maximal assistance, Sitting/lateral leans, Sit to/from stand Upper Body Dressing : Minimal assistance, Sitting Lower Body Dressing: Supervision/safety, Sitting/lateral leans Lower Body Dressing Details (indicate cue type and reason): to adjust socks from EOB Toilet Transfer: Minimal assistance, RW, Ambulation Toilet Transfer Details (indicate cue type and reason): simulated via functional mobility with RW; min A for balance and RW mgmt Toileting- Clothing Manipulation and Hygiene: Minimal assistance Toileting - Clothing Manipulation Details (indicate cue type and reason): pt completed posterior care standing with minA Functional mobility during ADLs: Minimal assistance, Rolling walker General ADL Comments: pt tachy during session limiting further ADLs or mobility tasks     Mobility   Overal bed mobility: Needs Assistance Bed Mobility: Supine to Sit Supine to sit: Supervision Sit to supine: Supervision, HOB elevated General bed mobility comments: HOB elevated, no physical assist, supervision for safety     Transfers   Overall transfer level: Needs assistance Equipment used: Rolling walker (2 wheeled) Transfers: Sit to/from Stand Sit to Stand: Min  assist, Min guard General transfer comment: min guard from EOB and MIN A topowr up from chair with no arm rests     Ambulation / Gait / Stairs / Wheelchair Mobility   Ambulation/Gait Ambulation/Gait  assistance: Min assist, +2 safety/equipment (for chair follow) Gait Distance (Feet): 150 Feet Assistive device: Rolling walker (2 wheeled) Gait Pattern/deviations: Step-through pattern, Decreased stride length, Decreased dorsiflexion - right, Decreased weight shift to right General Gait Details: pt with noted R knee buckling requiring max verbal and tactile cues to extend R knee during stance phase, with onset of fatigue R knee buckling progressively worsening requiring modA to prevent fall, pt with better walker management today as well Gait velocity: dec Gait velocity interpretation: <1.31 ft/sec, indicative of household ambulator     Posture / Balance Dynamic Sitting Balance Sitting balance - Comments: attempted to don socks but required minA for bilat feet Balance Overall balance assessment: Needs assistance Sitting-balance support: Feet supported Sitting balance-Leahy Scale: Good Sitting balance - Comments: attempted to don socks but required minA for bilat feet Postural control: Posterior lean Standing balance support: During functional activity, Single extremity supported Standing balance-Leahy Scale: Poor Standing balance comment: at least one UE supported during standing ADL tasks     Special needs/care consideration  Diabetic management : no   and Designated visitor: nick        Previous Environmental health practitioner (from acute therapy documentation) Living Arrangements: Alone, Non-relatives/Friends  Lives With: Alone Available Help at Discharge: Friend(s), Available PRN/intermittently Type of Home: Apartment Home Layout: One level Home Access: Building control surveyor Shower/Tub: Chiropodist: Standard Bathroom Accessibility: Yes How Accessible: Accessible via  walker Home Care Services: No   Discharge Living Setting Plans for Discharge Living Setting: Patient's home, Apartment Type of Home at Discharge: Apartment Discharge Home Layout: One level Discharge Home Access: Elevator Discharge Bathroom Shower/Tub: Tub/shower unit Discharge Bathroom Toilet: Standard Discharge Bathroom Accessibility: Yes How Accessible: Accessible via walker Does the patient have any problems obtaining your medications?: No   Social/Family/Support Systems Patient Roles: Other (Comment) Contact Information: [phone] Anticipated Caregiver: Grace Blight (friend) Anticipated Caregiver's Contact Information: [phone] Ability/Limitations of Caregiver: None  Caregiver Availability: Intermittent Discharge Plan Discussed with Primary Caregiver: Yes with pt and [name]     Goals Patient/Family Goal for Rehab: PT: Mod I; OT/SLP: supervision Expected length of stay: 6-9 days Pt/Family Agrees to Admission and willing to participate: Yes Program Orientation Provided & Reviewed with Pt/Caregiver Including Roles  & Responsibilities: Yes  Barriers to Discharge: Decreased caregiver support, Lack of/limited family support, Insurance for SNF coverage     Decrease burden of Care through IP rehab admission: NA     Possible need for SNF placement upon discharge: Not anticipated; pt has good social support from a family friend who has confirmed anticipated intermittent supervision/assist after DC from CIR for safety.      Patient Condition: This patient's medical and functional status has changed since the consult dated: 10/29/19 in which the Rehabilitation Physician determined and documented that the patient's condition is appropriate for intensive rehabilitative care in an inpatient rehabilitation facility. See "History of Present Illness" (above) for medical update. Functional changes are: overall min assist. Patient's medical and functional status update has been discussed  with the Rehabilitation physician and patient remains appropriate for inpatient rehabilitation. Will admit to inpatient rehab 7/10 when bed is available.   Preadmission Screen Completed By: Raechel Ache with updates by  Cleatrice Burke, RN,MSN 11/02/2019 11:45 AM ______________________________________________________________________   Discussed status with Dr. Posey Pronto on 11/02/2019 at 1148 and received approval for admission Saturday 11/03/2019 when bed is available.   Admission Coordinator: Raechel Ache with updates by  Cleatrice Burke, RN MSN time [phone] Date 11/02/2019  Revision History                                         Note Details  Author Jamse Arn, MD File Time 11/03/2019  9:57 AM  Author Type Physician Status Addendum  Last Editor Jamse Arn, MD Service Physical Medicine and [place] # [id number] Admit Date 11/03/2019

## 2019-11-05 NOTE — Progress Notes (Signed)
Inpatient Rehabilitation Care Coordinator Assessment and Plan  Patient Details  Name: Jeff [name] MRN: [id number] Date of Birth: 03/15/1946  Today's Date: 11/05/2019  Problem List:  Patient Active Problem List   Diagnosis Date Noted  . Chronic combined systolic and diastolic congestive heart failure ([place])   . Hyponatremia   . 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 Hypotension   . Acute ischemic stroke ([place]) 10/26/2019  . Cocaine abuse ([place]) 10/26/2019  . CKD (chronic kidney disease) stage 3, GFR 30-59 ml/min 10/26/2019  . Acute metabolic encephalopathy 00/17/4944  . Prostate cancer metastatic to bone ([place]) 10/26/2019  . CVA (cerebral vascular accident) ([place]) 10/25/2019  . Port or reservoir infection   . B12 deficiency 04/01/2016  . Malnutrition of moderate degree ([place]) 05/24/2014  . Bacteremia 05/24/2014  . Pyrexia   . Sepsis ([place]) 05/23/2014  . UTI (lower urinary tract infection) 05/23/2014  . Health care maintenance 02/06/2014  . Homelessness 01/31/2014  . Malignant neoplasm of prostate ([place]) 01/30/2014  . Bone lesion 01/26/2014  . Acute renal failure ([place]) 01/26/2014  . Bilateral hydronephrosis 01/26/2014  . Anemia of chronic disease 01/26/2014  . History of cocaine abuse ([place]) 01/26/2014   Past Medical History:  Past Medical History:  Diagnosis Date  . Acute renal failure ([place]) 01/26/2014  . Alcohol abuse   . Anemia of chronic disease 01/26/2014  . Bilateral hydronephrosis 01/26/2014  . History of cocaine abuse ([place]) 01/26/2014  . Homelessness 01/31/2014  . Prostate cancer metastatic to multiple sites ([place]) 01/30/2014  . Sepsis ([place])   . UTI (lower urinary tract infection)    Past Surgical History:  Past Surgical History:  Procedure Laterality Date  . ORCHIECTOMY Bilateral 02/05/2014   Procedure: BILATERAL ORCHIECTOMY;  Surgeon: Festus Aloe, MD;  Location: [facility];  Service: Urology;  Laterality: Bilateral;  . PERCUTANEOUS NEPHROSTOMY Bilateral    IR  Dr. Junious Silk changed on 04/22/2014  . PORT-A-CATH REMOVAL Right 08/08/2017   Procedure: REMOVAL PORT-A-CATH RIGHT CHEST (PROCEDURE #2);  Surgeon: Aviva Signs, MD;  Location: [facility];  Service: General;  Laterality: Right;  . PORTACATH PLACEMENT Right 03/12/14  . PORTACATH PLACEMENT Left 08/08/2017   Procedure: INSERTION POWER PORT WITH ATTACHED CATHETER LEFT SUBCLAVIAN (PROCEDURE #1);  Surgeon: Aviva Signs, MD;  Location: [facility];  Service: General;  Laterality: Left;   Social History:  reports that he has been smoking cigarettes. He has a 13.75 pack-year smoking history. He has never used smokeless tobacco. He reports current alcohol use of about 1.0 standard drink of alcohol per week. He reports current drug use. Drug: Cocaine.  Family / Support Systems Marital Status: Single Anticipated Caregiver: nick nickson Caregiver Availability: Intermittent  Social History Preferred language: English Religion: None Read: Yes Write: Yes   Abuse/Neglect Abuse/Neglect Assessment Can Be Completed: Yes Physical Abuse: Denies Verbal Abuse: Denies Sexual Abuse: Denies Exploitation of patient/patient's resources: Denies Self-Neglect: Denies  Emotional Status Substance Abuse History: 2015  Patient / Family Perceptions, Expectations & Goals Pt/Family understanding of illness & functional limitations: yes. Will provide update from CIR on Wednesdays Pt/family expectations/goals: Goal to dishcarge back home independent  [employer]: None Premorbid Home Care/DME Agencies: None Transportation available at discharge: friend able to transport  Discharge Planning Living Arrangements: Alone Support Systems: Friends/neighbors Type of Residence: Private residence (1 level apartment. Elevator access.) Insurance Resources: Multimedia programmer (specify) [facility]) Money Management: Patient Does the patient have any problems obtaining your medications?: Yes (Describe) Care  Coordinator Barriers to Discharge: Lack of/limited family support  Care Coordinator Barriers to Discharge Comments: Patient will dishcarge alone Care Coordinator Anticipated Follow Up Needs: HH/OP Expected length of stay: 6-9 Days  Clinical Impression Sw entered room, introduced self, explained role and process. Sw called patient friend "nick" at bedside to provide same information and get discharge plans. SW will continue to follow up with nick and patient with questions and concerns.   Dyanne Iha 11/05/2019, 12:50 PM

## 2019-11-05 NOTE — Progress Notes (Signed)
Patient information reviewed and entered into eRehab System by Becky Cedra Villalon, PPS coordinator. Information including medical coding, function ability, and quality indicators will be reviewed and updated through discharge.   

## 2019-11-05 NOTE — Progress Notes (Signed)
[facility] PHYSICAL MEDICINE & REHABILITATION PROGRESS NOTE  Subjective/Complaints: No complaints this morning.  About to work with SLP Hypotensive. Vitals otherwise stable. Hgb decreased, creatinine increased, Na downtrending, calcium low.   ROS: Denies CP, shortness of breath, nausea, vomiting, diarrhea.  Objective: Vital Signs: Blood pressure 97/63, pulse 75, temperature 99 F (37.2 C), temperature source Oral, resp. rate 16, height 6' (1.829 m), weight 69.4 kg, SpO2 97 %. No results found. Recent Labs    11/04/19 0022 11/05/19 0408  WBC 4.7 6.4  HGB 9.6* 8.8*  HCT 28.7* 26.7*  PLT 247 261   Recent Labs    11/03/19 2040 11/05/19 0408  NA 133* 131*  K 4.8 4.4  CL 100 99  CO2 23 23  GLUCOSE 113* 99  BUN 23 20  CREATININE 1.47* 1.65*  CALCIUM 8.7* 8.1*    Physical Exam: BP 97/63 (BP Location: Right Arm)   Pulse 75   Temp 99 F (37.2 C) (Oral)   Resp 16   Ht 6' (1.829 m)   Wt 69.4 kg   SpO2 97%   BMI 20.75 kg/m  Constitutional: No distress . Vital signs reviewed.  Thin. HENT: Normocephalic.  Atraumatic.  Poor dentition. Neck: Supple without JVD or lymphadenopathy Heart: Reg rate and rhythm. No murmurs rubs or gallops Chest: CTA bilaterally without wheezes, rales, or rhonchi; no distress Abdomen: Soft, non-tender, non-distended, bowel sounds positive. Extremities: No clubbing, cyanosis, or edema. Pulses are 2+ Psych: Normal mood.  Normal behavior. Musc: No edema in extremities.  No tenderness in extremities. Neuro: Alert Right facial weakness Dysarthria Mild left inattention.  He is able to follow simple one step commands.  Motor: RUE: 4+/5 proximal distal, unchanged RLE: 4+/5 proximal distal LUE/LLE: 5/5 proximal distal    Assessment/Plan: 1. Functional deficits secondary to acute left caudate and putamen infarct which require 3+ hours per day of interdisciplinary therapy in a comprehensive inpatient rehab setting.  Physiatrist is providing close  team supervision and 24 hour management of active medical problems listed below.  Physiatrist and rehab team continue to assess barriers to discharge/monitor patient progress toward functional and medical goals  Care Tool:  Bathing    Body parts bathed by patient: Right arm, Left arm, Chest, Abdomen, Front perineal area, Right upper leg, Left upper leg, Face   Body parts bathed by helper: Right lower leg, Left lower leg, Buttocks     Bathing assist Assist Level: Moderate Assistance - Patient 50 - 74%     Upper Body Dressing/Undressing Upper body dressing   What is the patient wearing?: Pull over shirt    Upper body assist Assist Level: Minimal Assistance - Patient > 75%    Lower Body Dressing/Undressing Lower body dressing      What is the patient wearing?: Underwear/pull up, Pants     Lower body assist Assist for lower body dressing: Moderate Assistance - Patient 50 - 74%     Toileting Toileting    Toileting assist Assist for toileting: Moderate Assistance - Patient 50 - 74%     Transfers Chair/bed transfer  Transfers assist     Chair/bed transfer assist level: Minimal Assistance - Patient > 75%     Locomotion Ambulation   Ambulation assist      Assist level: Minimal Assistance - Patient > 75% Assistive device: Walker-rolling Max distance: 20'   Walk 10 feet activity   Assist     Assist level: Minimal Assistance - Patient > 75% Assistive device: Walker-rolling   Walk  50 feet activity   Assist Walk 50 feet with 2 turns activity did not occur: Safety/medical concerns (decreased activity tolerance)         Walk 150 feet activity   Assist Walk 150 feet activity did not occur: Safety/medical concerns         Walk 10 feet on uneven surface  activity   Assist Walk 10 feet on uneven surfaces activity did not occur: Safety/medical concerns (decreased balance/activity tolerance)         Wheelchair     Assist   Type of  Wheelchair: Manual    Wheelchair assist level: Supervision/Verbal cueing Max wheelchair distance: 81 ft    Wheelchair 50 feet with 2 turns activity    Assist        Assist Level: Supervision/Verbal cueing   Wheelchair 150 feet activity     Assist     Assist Level: Moderate Assistance - Patient 50 - 74%      Medical Problem List and Plan: 1.  Dysarthria, right hemiparesis with numbness, right inattention and impulsivity secondary to acute left caudate and putamen infarct with involvement in insular, fronto-parietal cortical and subcortical brain and mild swelling.   Continue CIR 2.  Antithrombotics: -DVT/anticoagulation:  Pharmaceutical: Lovenox             -antiplatelet therapy: Low dose ASA 3. Pain Management:  4. Mood: LCSW to follow for evaluation and support.              -antipsychotic agents: N/a 5. Neuropsych: This patient is ?fully capable of making decisions on his own behalf. 6. Skin/Wound Care: Routine pressure relief measures.  7. Fluids/Electrolytes/Nutrition: Monitor I/Os.  CMP ordered. 8. Hypotension: Continue to monitor BP   ProAmatine increased to 10 mg tid on 7/11  Symptomatic orthostasis with near syncopal episode on 7/11, requiring patient to return to bed  Teds/abdominal binder ordered  500 cc fluid bolus given, see #14-slow infusion given poor EF.  Will need to monitor for fluid overload closely.  7/12: Give additional 500cc bolus given hypotension and uptrending Creatinine.              Monitor with increased mobility  9. 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 Delton Coombes. Last infusion  06/24  Discussed with pharmacy, appreciate input -due again 7/15.  Will follow up with oncology and Compazine post chemo orders if necessary 10. CKD IIIb: Baseline SCr 1.7-1.8 range per records. Will continue to monitor.              Creatinine 1.47 on 7/10, 1.65 on 7/12. See #8. Repeat BMP  tomorrow.  11. Polysubstance abuse: Continue to counsel on cessation.  12. Anemia of chronic disease:  Will continue to monitor.              Hemoglobin 9.6 on 7/11, 8.8 on 7/12 13.  Hyponatremia  Sodium 133 on 7/10, 131 on 7/12  May be contributing to #8  Twice daily salt tabs ordered 14.  Combined CHF Filed Weights   11/03/19 1534 11/05/19 0535  Weight: 72.4 kg 69.4 kg   Daily weights ordered  Echo reviewed-EF of 35%  LOS: 2 days A FACE TO FACE EVALUATION WAS PERFORMED  Nolie Bignell P Elnathan Fulford 11/05/2019, 10:18 AM

## 2019-11-05 NOTE — Progress Notes (Signed)
Occupational Therapy Session Note  Patient Details  Name: Jeff [name] MRN: [id number] Date of Birth: [birth year]  Today's Date: 11/05/2019 OT Individual Time: 0700-0740 and 1330-1400 OT Individual Time Calculation (min): 40 min and 30 mins   Short Term Goals: Week 1:  OT Short Term Goal 1 (Week 1): STG=LTG  Skilled Therapeutic Interventions/Progress Updates:    session 1: Upon entering the room, pt supine in bed and finishing breakfast. Pt is agreeable to OT intervention but declined bathing and dressing tasks. Pt asked if needing to use bathroom and he declines. Pt standing with min A and urinates on the floor. Pt unaware of urine and reports, " That wasn't me". Pt does report needing to use bathroom further and assisted to bathroom with min A and use of RW. Pt needing assistance with clothing management and having large BM on commode. Pt performed hygiene while seated and reports feeling very fatigued. Pt standing with min A and then needing min - mod A for ambulation back to recliner chair secondary to R LE weakness and knee buckling. Pt seated in recliner chair with chair alarm belt donned and call bell within reach.   Session 2: Upon entering the room, pt seated in recliner chair with abdominal binder on and TED hose with BP of 101/57 and asymptomatic. OT having pt demonstrated opposition exercises and rapid alternating movements with R UE significantly slower and less coordinated than L. Pt given pen to write name and displayed very gross grasp. Pt utilized L UE to play pen into more of a dynamic tripod grasp to write name. It was legible but handwriting got worse as he wrote more. OT having pt draw connected line from start to finish of minimally difficulty curved and blocked "angled" maze. Pt with no errors occurring on curved maze but pt did have to pick up pen several times to restart. Pt with over 10 errors on blocked 90 degree angle maze. Pt drawing outside of lines several times and  having to pick up and restart several times. Pt remained in recliner chair at end of session with chair alarm belt donned and call bell within reach.   Therapy Documentation Precautions:  Precautions Precautions: Fall Precaution Comments: R weakness UE>LE, R inattention, posterior lean in standing, low BP Restrictions Weight Bearing Restrictions: No General:   Vital Signs: Therapy Vitals Temp: 99 F (37.2 C) Temp Source: Oral Pulse Rate: 75 Resp: 16 BP: 97/63 Patient Position (if appropriate): Lying Oxygen Therapy SpO2: 97 % O2 Device: Room Air Pain: Pain Assessment Pain Scale: 0-10 Pain Score: 0-No pain ADL: ADL Eating: Set up Where Assessed-Eating: Chair Grooming: Minimal assistance Where Assessed-Grooming: Sitting at sink Upper Body Bathing: Minimal assistance Where Assessed-Upper Body Bathing: Sitting at sink Lower Body Bathing: Moderate assistance Where Assessed-Lower Body Bathing: Sitting at sink Upper Body Dressing: Minimal assistance Where Assessed-Upper Body Dressing: Standing at sink Lower Body Dressing: Moderate assistance Where Assessed-Lower Body Dressing: Sitting at sink, Standing at sink Toileting: Minimal assistance Where Assessed-Toileting: Bedside Commode Vision   Perception    Praxis   Exercises:   Other Treatments:     Therapy/Group: Individual Therapy  Gypsy Decant 11/05/2019, 7:44 AM

## 2019-11-05 NOTE — Care Management (Signed)
Patient ID: Jeff [name], male   DOB: 1946-02-15, 74 y.o.   MRN: [id number]  Met with the patient to review role of CM and collaboration with the SW Margreta Journey) to facilitate preparation for discharge. Reviewed risk factors and discussed smoking cessation, limited ETOH use and eating after a stroke. Patient is incontinent of B+B = timed toileting protocol; reviewed with the patient staff will encourage elimination on a regular basis. Reported an understanding of information reviewed along with medications and  Vit D deficiency. No other nursing issues noted at present. Margarito Liner

## 2019-11-05 NOTE — Progress Notes (Signed)
Physical Therapy Session Note  Patient Details  Name: Jeff [name] MRN: [id number] Date of Birth: 1945-10-28  Today's Date: 11/05/2019 PT Individual Time: 1105-1200 PT Individual Time Calculation (min): 55 min   Short Term Goals: Week 1:  PT Short Term Goal 1 (Week 1): Patient will perform transfer with CGA. PT Short Term Goal 2 (Week 1): Patient will perform dynamic standing balance with CGA >10 min. PT Short Term Goal 3 (Week 1): Patient will ambulate >100 feet with CGA using LRAD.  Skilled Therapeutic Interventions/Progress Updates: Pt presented in bed agreeable to therapy. Pt denies pain throughout session. PTA stated did not need to use bathroom when asked. Suggested to try to use anyways. Pt performed bed mobility CGA with use of bed features and STS from EOB with RW CGA. Pt ambulated to bathroom CGA with RW with decreased R foot clearance and R knee flexion. Pt required verbal cues to pull pants down but was able to perform toilet transfers with CGA (-void but incontinent of urine in brief). Pt then ambulated in same manner to w/c and BP checked prior to leaving room. Pt noted to be hypotensive therefore TED hose donned (109/42 MAP 56). Pt transported to rehab gym for energy conservation and performed stand pivot transfer HHA to mat. Pt participated in ankle pumps, LAQ, and hip flexion in sitting with BP assessed after activity (87/70). Abdominal binder placed with BP 93/71 MAP 79). Pt participated in standing balance horseshoes with and without AD with no LOB but verbal cues to improve standing posture and pt reaching with RUE for forced use, high/low/crossing midline. Pt noted to have increased posterior lean with erect posture and verbal cues for increased R knee extension however no overt LOB. Pt performed toe taps to 4in step with RW and PTA providing facilitation at R quad to promote knee extension with 50% carryover. Pt then ambulated 169f with RW and CGA with w/c follow. Pt required  verbal cues for increased BOS and erect posture with decreased R foot clearance with fatigue. Pt transported remaining distance back to room and BP checked at 96/77 (80). Performed stand pivot transfer to recliner with minA and no AD. Pt left in recliner at end of session with belt alarm on, call bell within reach and needs met.      Therapy Documentation Precautions:  Precautions Precautions: Fall Precaution Comments: R weakness UE>LE, R inattention, posterior lean in standing, low BP Restrictions Weight Bearing Restrictions: No General:   Vital Signs: Therapy Vitals Temp: 98.7 F (37.1 C) Pulse Rate: (!) 58 Resp: 17 BP: (!) 93/58 Patient Position (if appropriate): Lying Oxygen Therapy SpO2: 100 % O2 Device: Room Air   Therapy/Group: Individual Therapy  Jeanpierre Thebeau  Alysson Geist, PTA  11/05/2019, 4:22 PM

## 2019-11-05 NOTE — Progress Notes (Signed)
Inpatient [facility] Individual Statement of Services  Patient Name:  Jeff [name]  Date:  11/05/2019  Welcome to the [facility].  Our goal is to provide you with an individualized program based on your diagnosis and situation, designed to meet your specific needs.  With this comprehensive rehabilitation program, you will be expected to participate in at least 3 hours of rehabilitation therapies Monday-Friday, with modified therapy programming on the weekends.  Your rehabilitation program will include the following services:  Physical Therapy (PT), Occupational Therapy (OT), Speech Therapy (ST), 24 hour per day rehabilitation nursing, Therapeutic Recreaction (TR), Neuropsychology, Care Coordinator, Rehabilitation Medicine, Nutrition Services, Pharmacy Services and Other  Weekly team conferences will be held on Wednesdays to discuss your progress.  Your Inpatient Rehabilitation Care Coordinator will talk with you frequently to get your input and to update you on team discussions.  Team conferences with you and your family in attendance may also be held.  Expected length of stay: 6-9 Days  Overall anticipated outcome: Supervision  Depending on your progress and recovery, your program may change. Your Inpatient Rehabilitation Care Coordinator will coordinate services and will keep you informed of any changes. Your Inpatient Rehabilitation Care Coordinator's name and contact numbers are listed  below.  The following services may also be recommended but are not provided by the [facility]:    [place] will be made to provide these services after discharge if needed.  Arrangements include referral to agencies that provide these services.  Your insurance has been verified to be:  [facility] Your primary doctor is:  Mike Craze, NP  Pertinent information will be  shared with your doctor and your insurance company.  Inpatient Rehabilitation Care Coordinator:  Erlene Quan, [place] or [phone]  Information discussed with and copy given to patient by: Dyanne Iha, 11/05/2019, 12:23 PM

## 2019-11-06 ENCOUNTER — Inpatient Hospital Stay (HOSPITAL_COMMUNITY): Payer: Medicare Other

## 2019-11-06 ENCOUNTER — Inpatient Hospital Stay (HOSPITAL_COMMUNITY): Payer: Medicare Other | Admitting: Physical Therapy

## 2019-11-06 ENCOUNTER — Inpatient Hospital Stay (HOSPITAL_COMMUNITY): Payer: Medicare Other | Admitting: Occupational Therapy

## 2019-11-06 LAB — BASIC METABOLIC PANEL
Anion gap: 9 (ref 5–15)
BUN: 19 mg/dL (ref 8–23)
CO2: 23 mmol/L (ref 22–32)
Calcium: 8 mg/dL — ABNORMAL LOW (ref 8.9–10.3)
Chloride: 102 mmol/L (ref 98–111)
Creatinine, Ser: 1.61 mg/dL — ABNORMAL HIGH (ref 0.61–1.24)
GFR calc Af Amer: 49 mL/min — ABNORMAL LOW (ref >60)
GFR calc non Af Amer: 42 mL/min — ABNORMAL LOW (ref >60)
Glucose, Bld: 95 mg/dL (ref 70–99)
Potassium: 4.2 mmol/L (ref 3.5–5.1)
Sodium: 134 mmol/L — ABNORMAL LOW (ref 135–145)

## 2019-11-06 NOTE — Plan of Care (Signed)
  Problem: Consults Goal: RH STROKE PATIENT EDUCATION Description: See Patient Education module for education specifics Outcome: Progressing   Problem: RH BOWEL ELIMINATION Goal: RH STG MANAGE BOWEL WITH ASSISTANCE Description: STG Manage Bowel with Assistance. MOD I Outcome: Progressing   Problem: RH BLADDER ELIMINATION Goal: RH STG MANAGE BLADDER WITH ASSISTANCE Description: STG Manage Bladder With Assistance MOD I Outcome: Progressing   Problem: RH SAFETY Goal: RH STG ADHERE TO SAFETY PRECAUTIONS W/ASSISTANCE/DEVICE Description: STG Adhere to Safety Precautions With SUPERVISION Assistance/Device.  Outcome: Progressing   Problem: RH KNOWLEDGE DEFICIT Goal: RH STG INCREASE KNOWLEDGE OF STROKE PROPHYLAXIS Description: Patient will be able to describe measures to prevent stroke with cues/handouts Outcome: Progressing

## 2019-11-06 NOTE — Progress Notes (Signed)
Occupational Therapy Session Note  Patient Details  Name: Jeff [name] MRN: [id number] Date of Birth: [birth year]  Today's Date: 11/06/2019 OT Individual Time: 9892-1194 OT Individual Time Calculation (min): 55 min    Short Term Goals: Week 1:  OT Short Term Goal 1 (Week 1): STG=LTG  Skilled Therapeutic Interventions/Progress Updates:    Upon entering the room, pt supine in bed with no c/o pain and agreeable to OT intervention. Abdominal binder donned and supine BP of 88/62. Pt sitting on EOB with supervision and BP results of 83/61 and thigh high TED hose added. Pt donning pants and B socks from EOB with CGA for safety. Pt transferred from bed >recliner chair with RW and CGA. BP results of 108/61. Pt remains asymptomatic throughout session. OT demonstrated exercises for R hand NMR with use of red theraputty. PT returning demonstrations with min cuingh for technique and paper handout provided of exercises. Pt remained in recliner chair with chair alarm belt donned and activated. Call bell and all needed items within reach.   Therapy Documentation Precautions:  Precautions Precautions: Fall Precaution Comments: R weakness UE>LE, R inattention, posterior lean in standing, low BP Restrictions Weight Bearing Restrictions: No ADL: ADL Eating: Set up Where Assessed-Eating: Chair Grooming: Minimal assistance Where Assessed-Grooming: Sitting at sink Upper Body Bathing: Minimal assistance Where Assessed-Upper Body Bathing: Sitting at sink Lower Body Bathing: Moderate assistance Where Assessed-Lower Body Bathing: Sitting at sink Upper Body Dressing: Minimal assistance Where Assessed-Upper Body Dressing: Standing at sink Lower Body Dressing: Moderate assistance Where Assessed-Lower Body Dressing: Sitting at sink, Standing at sink Toileting: Minimal assistance Where Assessed-Toileting: Bedside Commode   Therapy/Group: Individual Therapy  Gypsy Decant 11/06/2019, 12:18 PM

## 2019-11-06 NOTE — Progress Notes (Signed)
Speech Language Pathology Daily Session Note  Patient Details  Name: Jeff [name] MRN: [id number] Date of Birth: 08/19/1945  Today's Date: 11/06/2019 SLP Individual Time: 1102-1200 SLP Individual Time Calculation (min): 58 min  Short Term Goals: Week 1: SLP Short Term Goal 1 (Week 1): STG=LTG due to ELOS  Skilled Therapeutic Interventions: Skilled ST services focused on cognitive skills. SLP facilitated basic problem solving skills in several tasks demonstrating initial delayed processing/comprehension of task and increase cuing as the level of complexity increased. Pt create lego block pattens with mod A verbal cues fading to min A verbals, sorted cards by 6 colors with mod A fade to supervision A verbal cues and participated in novel card task (blink) with max A verbal cues. Pt demonstrated ability to recall 3 rules in card task with supervision A verbal cues for use of visual aid, but demonstrated difficulty consistently applying rules within the task. Pt demonstrated slight increase insight into acute deficits, admitting need for assistance during card task. Pt was left in room with call bell within reach and chair alarm set. ST recommends to continue skilled ST services.      Pain Pain Assessment Pain Score: 0-No pain  Therapy/Group: Individual Therapy  Kaelea Gathright  [facility] 11/06/2019, 4:07 PM

## 2019-11-06 NOTE — Progress Notes (Signed)
Physical Therapy Session Note  Patient Details  Name: Jeff [name] MRN: [id number] Date of Birth: [birth year]  Today's Date: 11/06/2019 PT Individual Time: 1305-1400 PT Individual Time Calculation (min): 55 min   Short Term Goals: Week 1:  PT Short Term Goal 1 (Week 1): Patient will perform transfer with CGA. PT Short Term Goal 2 (Week 1): Patient will perform dynamic standing balance with CGA >10 min. PT Short Term Goal 3 (Week 1): Patient will ambulate >100 feet with CGA using LRAD.  Skilled Therapeutic Interventions/Progress Updates: Pt presented in recliner completing lunch agreeable to therapy.Pt denies pain during session. BP checked at start of session sitting with abdominal binder and thigh high TEDs placed, 78/64 (71) HR 57. PTA applied ace bandages and adjust abdominal binder, rechecked BP 98/71 (79). Pt asked if needed to use bathroom which pt denies however upon standing pt noted to be incontinent of bladder. Pt ambulated to bathroom with RW and CGA and sat at toilet for possible bowel movement (-BM/+ additional void). Pt stood from toilet and performed peri-care with wet washcloth with CGA. Pt then ambulated to w/c with RW and CGA. Pt transported to day room and participated in Cybex Kinetron seated at 50cm/sec 2 x 10 cycles for forced use of RLE. Pt then participated in standing 3 bouts x 10 cycles at 30cm/sec with emphasis and facilitation of R knee extension. Once completed pt transfers back to w/c and participated in obstacle course weaving through cones and stepping over sticks 40f x 2. Pt was able to perform with minA due to requiring cues for safety with RW management and maintaining R knee extension in stance phase. BP checked after activities and improved to 109/66 (77) HR 67. Pt transported back to room at end of session and performed ambulatory transfer back to recliner. Pt left in recliner at end of session with belt alarm on, call bell within reach and needs met.       Therapy Documentation Precautions:  Precautions Precautions: Fall Precaution Comments: R weakness UE>LE, R inattention, posterior lean in standing, low BP Restrictions Weight Bearing Restrictions: No General:   Vital Signs: Therapy Vitals Temp: 98.2 F (36.8 C) Pulse Rate: 69 Resp: 17 BP: 101/71 Patient Position (if appropriate): Sitting Oxygen Therapy SpO2: 92 % O2 Device: Room Air Pain: Pain Assessment Pain Score: 0-No pain Mobility:   Locomotion :    Trunk/Postural Assessment :    Balance:   Exercises:   Other Treatments:      Therapy/Group: Individual Therapy  Star Resler 11/06/2019, 4:31 PM

## 2019-11-06 NOTE — Progress Notes (Signed)
[facility] PHYSICAL MEDICINE & REHABILITATION PROGRESS NOTE  Subjective/Complaints: Jeff [name] had no complaints this morning. Discussed with Jeff [name] OT that he continues to have low BP during his therapy sessions.   ROS: Denies CP, shortness of breath, nausea, vomiting, diarrhea.  Objective: Vital Signs: Blood pressure 101/71, pulse 69, temperature 98.2 F (36.8 C), resp. rate 17, height 6' (1.829 m), weight 70.6 kg, SpO2 92 %. No results found. Recent Labs    11/04/19 0022 11/05/19 0408  WBC 4.7 6.4  HGB 9.6* 8.8*  HCT 28.7* 26.7*  PLT 247 261   Recent Labs    11/05/19 0408 11/06/19 0317  NA 131* 134*  K 4.4 4.2  CL 99 102  CO2 23 23  GLUCOSE 99 95  BUN 20 19  CREATININE 1.65* 1.61*  CALCIUM 8.1* 8.0*    Physical Exam: BP 101/71 (BP Location: Left Arm)   Pulse 69   Temp 98.2 F (36.8 C)   Resp 17   Ht 6' (1.829 m)   Wt 70.6 kg   SpO2 92%   BMI 21.11 kg/m  General: Thin, No apparent distress HEENT: Head is normocephalic, atraumatic, PERRLA, EOMI, sclera anicteric, poor dentition. Neck: Supple without JVD or lymphadenopathy Heart: Reg rate and rhythm. No murmurs rubs or gallops Chest: CTA bilaterally without wheezes, rales, or rhonchi; no distress Abdomen: Soft, non-tender, non-distended, bowel sounds positive. Extremities: No clubbing, cyanosis, or edema. Pulses are 2+ Skin: Clean and intact without signs of breakdown Neuro: Alert Right facial weakness Dysarthria Mild left inattention.  He is able to follow simple one step commands.  Motor: RUE: 4+/5 proximal distal, unchanged RLE: 4+/5 proximal distal LUE/LLE: 5/5 proximal distal    Assessment/Plan: 1. Functional deficits secondary to acute left caudate and putamen infarct which require 3+ hours per day of interdisciplinary therapy in a comprehensive inpatient rehab setting.  Physiatrist is providing close team supervision and 24 hour management of active medical problems listed  below.  Physiatrist and rehab team continue to assess barriers to discharge/monitor patient progress toward functional and medical goals  Care Tool:  Bathing    Body parts bathed by patient: Right arm, Left arm, Chest, Abdomen, Front perineal area, Right upper leg, Left upper leg, Face   Body parts bathed by helper: Right lower leg, Left lower leg, Buttocks     Bathing assist Assist Level: Moderate Assistance - Patient 50 - 74%     Upper Body Dressing/Undressing Upper body dressing   What is the patient wearing?: Pull over shirt    Upper body assist Assist Level: Minimal Assistance - Patient > 75%    Lower Body Dressing/Undressing Lower body dressing      What is the patient wearing?: Underwear/pull up, Pants     Lower body assist Assist for lower body dressing: Moderate Assistance - Patient 50 - 74%     Toileting Toileting    Toileting assist Assist for toileting: Moderate Assistance - Patient 50 - 74%     Transfers Chair/bed transfer  Transfers assist     Chair/bed transfer assist level: Minimal Assistance - Patient > 75%     Locomotion Ambulation   Ambulation assist      Assist level: Minimal Assistance - Patient > 75% Assistive device: Walker-rolling Max distance: 20'   Walk 10 feet activity   Assist     Assist level: Minimal Assistance - Patient > 75% Assistive device: Walker-rolling   Walk 50 feet activity   Assist Walk 50 feet with 2  turns activity did not occur: Safety/medical concerns (decreased activity tolerance)         Walk 150 feet activity   Assist Walk 150 feet activity did not occur: Safety/medical concerns         Walk 10 feet on uneven surface  activity   Assist Walk 10 feet on uneven surfaces activity did not occur: Safety/medical concerns (decreased balance/activity tolerance)         Wheelchair     Assist Will patient use wheelchair at discharge?: No (No long term goals set by PT) Type of  Wheelchair: Manual    Wheelchair assist level: Supervision/Verbal cueing Max wheelchair distance: 81 ft    Wheelchair 50 feet with 2 turns activity    Assist        Assist Level: Supervision/Verbal cueing   Wheelchair 150 feet activity     Assist     Assist Level: Moderate Assistance - Patient 50 - 74%      Medical Problem List and Plan: 1.  Dysarthria, right hemiparesis with numbness, right inattention and impulsivity secondary to acute left caudate and putamen infarct with involvement in insular, fronto-parietal cortical and subcortical brain and mild swelling.   Continue CIR 2.  Antithrombotics: -DVT/anticoagulation  Pharmaceutical: Lovenox             -antiplatelet therapy: Low dose ASA 3. Pain Management: Well controlled.  4. Mood: LCSW to follow for evaluation and support.              -antipsychotic agents: N/a 5. Neuropsych: This patient is ?fully capable of making decisions on his own behalf. 6. Skin/Wound Care: Routine pressure relief measures.  7. Fluids/Electrolytes/Nutrition: Monitor I/Os.  CMP ordered. 8. Hypotension: Continue to monitor BP   ProAmatine increased to 10 mg tid on 7/11  Symptomatic orthostasis with near syncopal episode on 7/11, requiring patient to return to bed  Teds/abdominal binder ordered  500 cc fluid bolus given, see #14-slow infusion given poor EF.  Will need to monitor for fluid overload closely.  7/12: Give additional 500cc bolus given hypotension and uptrending Creatinine.   7/13: Asymptomatic othostasis today- better controlled with thigh high TEDs.              Monitor with increased mobility  9. 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 Delton [name]. Last infusion  06/24  Discussed with pharmacy, appreciate input -due again 7/15.  Will follow up with oncology and Compazine post chemo orders if necessary 10. CKD IIIb: Baseline SCr 1.7-1.8 range per records. Will  continue to monitor.              Creatinine 1.47 on 7/10, 1.65 on 7/12, 1.61 on 7/13.  11. Polysubstance abuse: Continue to counsel on cessation.  12. Anemia of chronic disease:  Will continue to monitor.              Hemoglobin 9.6 on 7/11, 8.8 on 7/12 13.  Hyponatremia  Sodium 133 on 7/10, 131 on 7/12  May be contributing to #8  Twice daily salt tabs ordered  7/13: Na is 134 14.  Combined CHF Filed Weights   11/03/19 1534 11/05/19 0535 11/06/19 0454  Weight: 72.4 kg 69.4 kg 70.6 kg   Daily weights ordered  Echo reviewed-EF of 35%  LOS: 3 days A FACE TO FACE EVALUATION WAS PERFORMED  Sylas Twombly P Lineth Thielke 11/06/2019, 7:20 PM

## 2019-11-06 NOTE — IPOC Note (Signed)
Overall Plan of Care [facility]) Patient Details Name: Jeff [name] MRN: [id number] DOB: April 21, 1946  Admitting Diagnosis: Acute ischemic stroke [facility])  Hospital Problems: Principal Problem:   Acute ischemic stroke [facility]) Active Problems:   Subcortical infarction ([place])   Chronic combined systolic and diastolic congestive heart failure ([place])   Hyponatremia     Functional Problem List: Nursing Bladder, Bowel, Endurance, Medication Management, Nutrition  PT Balance, Safety, Behavior, Sensory, Edema, Skin Integrity, Endurance, Motor, Nutrition, Pain, Perception  OT Balance, Cognition, Endurance, Motor, Safety, Sensory  SLP Cognition, Linguistic  TR         Basic ADLs: OT Grooming, Bathing, Dressing, Toileting     Advanced  ADLs: OT       Transfers: PT Bed Mobility, Bed to Chair, Car, [employer], Floor  OT Toilet, Tub/Shower     Locomotion: PT Ambulation, Emergency planning/management officer, Stairs     Additional Impairments: OT Fuctional Use of Upper Extremity  SLP Communication, Social Cognition expression Problem Solving, Memory, Awareness  TR      Anticipated Outcomes Item Anticipated Outcome  Self Feeding    Swallowing      Basic self-care  Mod I  Toileting  Mod I   Bathroom Transfers Mod I  Bowel/Bladder  MAINTAIN REGULAR PATTERN OF EMPTYING  Transfers  mod I using LRAD  Locomotion  mod I household, supervison community 150 ft using LRAD  Communication  supervision  Cognition  min assist  Pain  LESS THAN 2 OR NO PAIN  Safety/Judgment  FREE OF FALLS, INFECTION, SKIN BREAFKDOWN   Therapy Plan: PT Intensity: Minimum of 1-2 x/day ,45 to 90 minutes PT Frequency: 5 out of 7 days PT Duration Estimated Length of Stay: 8-12 days OT Intensity: Minimum of 1-2 x/day, 45 to 90 minutes OT Frequency: 5 out of 7 days OT Duration/Estimated Length of Stay: 7-10 days SLP Intensity: Minumum of 1-2 x/day, 30 to 90 minutes SLP Frequency: 3 to 5 out of 7 days SLP  Duration/Estimated Length of Stay: 7-10 days   Due to the current state of emergency, patients may not be receiving their 3-hours of Medicare-mandated therapy.   Team Interventions: Nursing Interventions Patient/Family Education, Bowel Management, Bladder Management, Cognitive Remediation/Compensation, Medication Management, Discharge Planning  PT interventions Ambulation/gait training, Community reintegration, DME/adaptive equipment instruction, Neuromuscular re-education, Psychosocial support, Stair training, UE/LE Strength taining/ROM, Wheelchair propulsion/positioning, Training and development officer, Discharge planning, Pain management, Functional electrical stimulation, Skin care/wound management, Therapeutic Activities, UE/LE Coordination activities, Cognitive remediation/compensation, Disease management/prevention, Functional mobility training, Patient/family education, Splinting/orthotics, Therapeutic Exercise, Visual/perceptual remediation/compensation  OT Interventions Balance/vestibular training, Cognitive remediation/compensation, Community reintegration, Discharge planning, DME/adaptive equipment instruction, Functional mobility training, Neuromuscular re-education, Patient/family education, Self Care/advanced ADL retraining, Therapeutic Activities, Therapeutic Exercise, UE/LE Strength taining/ROM, UE/LE Coordination activities, Wheelchair propulsion/positioning  SLP Interventions Cognitive remediation/compensation, Cueing hierarchy, Internal/external aids, Speech/Language facilitation, Patient/family education, Functional tasks  TR Interventions    SW/CM Interventions Discharge Planning, Psychosocial Support, Patient/Family Education   Barriers to Discharge MD  Medical stability, Medication compliance and Behavior  Nursing Decreased caregiver support    PT Decreased caregiver support, Lack of/limited family support, Behavior Patient has a friend that can provide PRN supervision at d/c as  primary caregiver, patient has poor incite into his deficits and R inattention  OT Decreased caregiver support Patient has no immediate family nearby. Patient reports friends able to assist post d/c.  SLP Decreased caregiver support only has intermittent supervision  SW Lack of/limited family support Patient will dishcarge alone   Team Discharge Planning: Destination: PT-Home ,OT-  Home , SLP-Home Projected Follow-up: PT-Home health PT, OT-  Home health OT, SLP-Home Health SLP, 24 hour supervision/assistance Projected Equipment Needs: PT-To be determined, OT- To be determined, SLP-None recommended by SLP Equipment Details: PT-Will determine appropriate DME needs based on patient's progress, OT-  Patient/family involved in discharge planning: PT- Patient,  OT-Patient, SLP-Patient  MD ELOS: 7-10 days Medical Rehab Prognosis:  Excellent Assessment: [name] is a 74 year old man who is admitted to CIR with dysarthria, right hemiparesis with numbness, right inattention and impulsivity secondary to acute left caudate and putamen infarct with involvement in insular, fronto-parietal cortical and subcortical brain and mild swelling. His blood pressure has been very low during therapy sessions despite use of abdominal binder and TEDs. Given patient's severe CHF we have been cautious with fluids and he is currently not symptomatic. CMPs are being monitored regularly given his CKD stage 3b. CBCs are being monitored regularly given his anemia of chronic disease.    See Team Conference Notes for weekly updates to the plan of care

## 2019-11-07 ENCOUNTER — Inpatient Hospital Stay (HOSPITAL_COMMUNITY): Payer: Medicare Other | Admitting: Occupational Therapy

## 2019-11-07 ENCOUNTER — Inpatient Hospital Stay (HOSPITAL_COMMUNITY): Payer: Medicare Other | Admitting: Speech Pathology

## 2019-11-07 ENCOUNTER — Inpatient Hospital Stay (HOSPITAL_COMMUNITY): Payer: Medicare Other | Admitting: Physical Therapy

## 2019-11-07 NOTE — Progress Notes (Signed)
Speech Language Pathology Daily Session Note  Patient Details  Name: Jeff [name] MRN: [id number] Date of Birth: [birth year]  Today's Date: 11/07/2019 SLP Individual Time: 0829-0929 SLP Individual Time Calculation (min): 60 min  Short Term Goals: Week 1: SLP Short Term Goal 1 (Week 1): STG=LTG due to ELOS  Skilled Therapeutic Interventions: Pt was seen for skilled ST targeting cognitive-linguistic goals. Pt required Min A verbal and visual cues to interpret basic weekly weather forecasts. Max A multimodal cues required for basic calculations to determine differences between high and low temperatures. Max A verbal and demonstrational cues also required for recall of any specific speech intelligibility strategies and ST targets. Although a significant amount of extra time was required, pt recreated basic to mildly complex PEG board designs from photographs with Supervision A level verbal cues for problem solving and Min A for error awareness.  Pt left laying in bed with alarm set and needs within reach. Continue per current plan of care.        Pain Pain Assessment Pain Scale: 0-10 Pain Score: 0-No pain  Therapy/Group: Individual Therapy  Arbutus Leas 11/07/2019, 7:20 AM

## 2019-11-07 NOTE — Progress Notes (Signed)
Team Conference Report to Patient/Family  Team Conference discussion was reviewed with the patient and caregiver, including goals, any changes in plan of care and target discharge date.  Patient and caregiver express understanding and are in agreement.  The patient has a target discharge date of 11/14/19.  Dyanne Iha 11/07/2019, 2:39 PM

## 2019-11-07 NOTE — Progress Notes (Signed)
Occupational Therapy Session Note  Patient Details  Name: Baraka Klatt MRN: [id number] Date of Birth: October 06, 1945  Today's Date: 11/07/2019 OT Individual Time: 5301-0404 OT Individual Time Calculation (min): 24 min    Short Term Goals: Week 1:  OT Short Term Goal 1 (Week 1): STG=LTG  Skilled Therapeutic Interventions/Progress Updates:    Upon entering the room, pt seated in recliner chair without c/o pain. Pt agreeable to OT intervention but does report fatigue. Pt seated in chair with B TED hose and ACE wraps removed with abdominal binder only donned and therefore transferred into wheelchair with min guard stand pivot transfer and was assisted into the bathroom. Stand pivot transfer to commode with CGA with min cuing for clothing management. Pt returning to wheelchair in same manner and then to bed. Min guard for stand pivot transfer into bed. Supervision for sit >supine. Call bell and all needed items within reach.Bed alarm activated.   Therapy Documentation Precautions:  Precautions Precautions: Fall Precaution Comments: R weakness UE>LE, R inattention, posterior lean in standing, low BP Restrictions Weight Bearing Restrictions: No Vital Signs: Therapy Vitals Temp: 98 F (36.7 C) Pulse Rate: (!) 54 Resp: 16 BP: 94/83 Patient Position (if appropriate): Sitting Oxygen Therapy O2 Device: Room Air ADL: ADL Eating: Set up Where Assessed-Eating: Chair Grooming: Minimal assistance Where Assessed-Grooming: Sitting at sink Upper Body Bathing: Minimal assistance Where Assessed-Upper Body Bathing: Sitting at sink Lower Body Bathing: Moderate assistance Where Assessed-Lower Body Bathing: Sitting at sink Upper Body Dressing: Minimal assistance Where Assessed-Upper Body Dressing: Standing at sink Lower Body Dressing: Moderate assistance Where Assessed-Lower Body Dressing: Sitting at sink, Standing at sink Toileting: Minimal assistance Where Assessed-Toileting: Bedside  Commode   Therapy/Group: Individual Therapy  Gypsy Decant 11/07/2019, 4:06 PM

## 2019-11-07 NOTE — Plan of Care (Signed)
  Problem: Consults Goal: RH STROKE PATIENT EDUCATION Description: See Patient Education module for education specifics Outcome: Progressing   Problem: RH BOWEL ELIMINATION Goal: RH STG MANAGE BOWEL WITH ASSISTANCE Description: STG Manage Bowel with Assistance. MOD I Outcome: Progressing   Problem: RH BLADDER ELIMINATION Goal: RH STG MANAGE BLADDER WITH ASSISTANCE Description: STG Manage Bladder With Assistance MOD I Outcome: Progressing   Problem: RH SAFETY Goal: RH STG ADHERE TO SAFETY PRECAUTIONS W/ASSISTANCE/DEVICE Description: STG Adhere to Safety Precautions With SUPERVISION Assistance/Device.  Outcome: Progressing   Problem: RH KNOWLEDGE DEFICIT Goal: RH STG INCREASE KNOWLEDGE OF STROKE PROPHYLAXIS Description: Patient will be able to describe measures to prevent stroke with cues/handouts Outcome: Progressing

## 2019-11-07 NOTE — Progress Notes (Signed)
Physical Therapy Session Note  Patient Details  Name: Jeff [name] MRN: [id number] Date of Birth: 07/01/1945  Today's Date: 11/07/2019 PT Individual Time: 1305-1400 PT Individual Time Calculation (min): 55 min   Short Term Goals: Week 1:  PT Short Term Goal 1 (Week 1): Patient will perform transfer with CGA. PT Short Term Goal 2 (Week 1): Patient will perform dynamic standing balance with CGA >10 min. PT Short Term Goal 3 (Week 1): Patient will ambulate >100 feet with CGA using LRAD.  Skilled Therapeutic Interventions/Progress Updates: Pt presented in recliner completing lunch agreeable to therapy. Pt denies pain at start of session. BP checked at start 94/83 with all prophylactics placed. Pt ambulated to bathroom per pt request with brief dry but pt having continent void on toilet. Pt then ambulated to sink for hand hygiene with pt using both hands while standing at sink. Pt then ambulated to rehab gym with RW and CGA with pt demonstrating improved R TKE during gait. Pt participated in toe taps R then L x 10 for wt shifting and coordination then pt participated in alternating toe taps x 10. With fatigue pt with decreased ability to clear RLE from step. BP checked after activity 124/76 (90). Participated in STS x 8 without AD for BLE with tap placed between feet to increase BOS. Pt also participated in static standing on [employer] performing EO/EC, gentle nudges with emphasis on posterior push, shoulder flexion to 90. Pt was able to demonstrate improving ankle strategy during activities.  Pt ambulated back to room with CGA nearing close supervision. Pt returned to recliner and left with belt alarm on, call bell within reach and needs met.      Therapy Documentation Precautions:  Precautions Precautions: Fall Precaution Comments: R weakness UE>LE, R inattention, posterior lean in standing, low BP Restrictions Weight Bearing Restrictions: No General:   Vital Signs: Therapy Vitals Temp: 98 F  (36.7 C) Pulse Rate: (!) 54 Resp: 16 BP: 94/83 Patient Position (if appropriate): Sitting Oxygen Therapy O2 Device: Room Air Pain:   Mobility:   Locomotion :    Trunk/Postural Assessment :    Balance:   Exercises:   Other Treatments:      Therapy/Group: Individual Therapy  Sweden Lesure 11/07/2019, 5:11 PM

## 2019-11-07 NOTE — Progress Notes (Signed)
Jeff [name] PHYSICAL MEDICINE & REHABILITATION PROGRESS NOTE  Subjective/Complaints:   Pt working with SLP on a puzzle, no new issues  ROS: Denies CP, shortness of breath, nausea, vomiting, diarrhea.  Objective: Vital Signs: Blood pressure 96/62, pulse 62, temperature 98.5 F (36.9 C), temperature source Oral, resp. rate 17, height 6' (1.829 m), weight 77.2 kg, SpO2 97 %. No results found. Recent Labs    11/05/19 0408  WBC 6.4  HGB 8.8*  HCT 26.7*  PLT 261   Recent Labs    11/05/19 0408 11/06/19 0317  NA 131* 134*  K 4.4 4.2  CL 99 102  CO2 23 23  GLUCOSE 99 95  BUN 20 19  CREATININE 1.65* 1.61*  CALCIUM 8.1* 8.0*    Physical Exam: BP 96/62   Pulse 62   Temp 98.5 F (36.9 C) (Oral)   Resp 17   Ht 6' (1.829 m)   Wt 77.2 kg   SpO2 97%   BMI 23.08 kg/m   General: No acute distress Mood and affect are appropriate Heart: Regular rate and rhythm no rubs murmurs or extra sounds Lungs: Clear to auscultation, breathing unlabored, no rales or wheezes Abdomen: Positive bowel sounds, soft nontender to palpation, nondistended Extremities: No clubbing, cyanosis, or edema Skin: No evidence of breakdown, no evidence of rash   Dysarthria Mild left inattention.  He is able to follow simple one step commands.  Motor: RUE: 4+/5 proximal distal, unchanged RLE: 4+/5 proximal distal LUE/LLE: 5/5 proximal distal    Assessment/Plan: 1. Functional deficits secondary to acute left caudate and putamen infarct which require 3+ hours per day of interdisciplinary therapy in a comprehensive inpatient rehab setting.  Physiatrist is providing close team supervision and 24 hour management of active medical problems listed below.  Physiatrist and rehab team continue to assess barriers to discharge/monitor patient progress toward functional and medical goals  Care Tool:  Bathing    Body parts bathed by patient: Right arm, Left arm, Chest, Abdomen, Front perineal area, Right upper  leg, Left upper leg, Face   Body parts bathed by helper: Right lower leg, Left lower leg, Buttocks     Bathing assist Assist Level: Moderate Assistance - Patient 50 - 74%     Upper Body Dressing/Undressing Upper body dressing   What is the patient wearing?: Pull over shirt    Upper body assist Assist Level: Minimal Assistance - Patient > 75%    Lower Body Dressing/Undressing Lower body dressing      What is the patient wearing?: Underwear/pull up, Pants     Lower body assist Assist for lower body dressing: Moderate Assistance - Patient 50 - 74%     Toileting Toileting    Toileting assist Assist for toileting: Moderate Assistance - Patient 50 - 74%     Transfers Chair/bed transfer  Transfers assist     Chair/bed transfer assist level: Minimal Assistance - Patient > 75%     Locomotion Ambulation   Ambulation assist      Assist level: Minimal Assistance - Patient > 75% Assistive device: Walker-rolling Max distance: 20'   Walk 10 feet activity   Assist     Assist level: Minimal Assistance - Patient > 75% Assistive device: Walker-rolling   Walk 50 feet activity   Assist Walk 50 feet with 2 turns activity did not occur: Safety/medical concerns (decreased activity tolerance)         Walk 150 feet activity   Assist Walk 150 feet activity did not occur:  Safety/medical concerns         Walk 10 feet on uneven surface  activity   Assist Walk 10 feet on uneven surfaces activity did not occur: Safety/medical concerns (decreased balance/activity tolerance)         Wheelchair     Assist Will patient use wheelchair at discharge?: No (No long term goals set by PT) Type of Wheelchair: Manual    Wheelchair assist level: Supervision/Verbal cueing Max wheelchair distance: 81 ft    Wheelchair 50 feet with 2 turns activity    Assist        Assist Level: Supervision/Verbal cueing   Wheelchair 150 feet activity     Assist      Assist Level: Moderate Assistance - Patient 50 - 74%      Medical Problem List and Plan: 1.  Dysarthria, right hemiparesis with numbness, right inattention and impulsivity secondary to acute left caudate and putamen infarct with involvement in insular, fronto-parietal cortical and subcortical brain and mild swelling.   Continue CIR, Team conference today please see physician documentation under team conference tab, met with team  to discuss problems,progress, and goals. Formulized individual treatment plan based on medical history, underlying problem and comorbidities.  2.  Antithrombotics: -DVT/anticoagulation  Pharmaceutical: Lovenox             -antiplatelet therapy: Low dose ASA 3. Pain Management: Well controlled.  4. Mood: LCSW to follow for evaluation and support.              -antipsychotic agents: N/a 5. Neuropsych: This patient is ?fully capable of making decisions on his own behalf. 6. Skin/Wound Care: Routine pressure relief measures.  7. Fluids/Electrolytes/Nutrition: Monitor I/Os.  CMP ordered. 8. Hypotension: Continue to monitor BP   ProAmatine increased to 10 mg tid on 7/11  Symptomatic orthostasis with near syncopal episode on 7/11, requiring patient to return to bed  Teds/abdominal binder ordered  500 cc fluid bolus given, see #14-slow infusion given poor EF.  Will need to monitor for fluid overload closely.  7/12: Give additional 500cc bolus given hypotension and uptrending Creatinine.   7/13: Asymptomatic othostasis today- better controlled with thigh high TEDs.              Monitor with increased mobility  9. 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 Delton Coombes. Last infusion  06/24  Discussed with pharmacy, appreciate input -due again 7/15.  Will follow up with oncology and Compazine post chemo orders if necessary 10. CKD IIIb: Baseline SCr 1.7-1.8 range per records. Will continue to monitor.               Creatinine 1.47 on 7/10, 1.65 on 7/12, 1.61 on 7/13.  11. Polysubstance abuse: Continue to counsel on cessation.  12. Anemia of chronic disease:  Will continue to monitor.              Hemoglobin 9.6 on 7/11, 8.8 on 7/12 13.  Hyponatremia  Sodium 133 on 7/10, 131 on 7/12  May be contributing to #8  Twice daily salt tabs ordered  7/13: Na is 134 14.  Combined CHF Filed Weights   11/05/19 0535 11/06/19 0454 11/07/19 0601  Weight: 69.4 kg 70.6 kg 77.2 kg   Daily weights ordered- up 7 kg?- no peripheral edema, lung exam nl   Echo reviewed-EF of 35%  LOS: 4 days A FACE TO FACE EVALUATION WAS PERFORMED  Charlett Blake 11/07/2019, 9:13 AM

## 2019-11-07 NOTE — Progress Notes (Signed)
Patient ID: Jeff [name], male   DOB: [birth year], 74 y.o.   MRN: [id number]   Sw attempted to call friend to provide team conference updates for the week. No answer, will continue to follow up

## 2019-11-07 NOTE — Progress Notes (Signed)
Occupational Therapy Session Note  Patient Details  Name: Jeff [name] MRN: [id number] Date of Birth: 26-Nov-1945  Today's Date: 11/07/2019 OT Individual Time: 0930-1000 OT Individual Time Calculation (min): 30 min    Short Term Goals: Week 1:  OT Short Term Goal 1 (Week 1): STG=LTG Week 2:     Skilled Therapeutic Interventions/Progress Updates:    1:1 Pt in bed when arrived. Pt brief was wet but pt unaware. Pt reported sleeping in abdominal binder, TEDs and ace wraps. Provided education to pt to doff these at night and then they will be donned again in the am before getting up. Also placed a note at the [facility] as a reminder to doff. Pt able to perform close supervision bed <w/c<>toilet stand pivot Transfer with extra time for safety. Pt with no c/o of BP. BP in bed was 88/70 and then after toileting pt was 108/70 without symptoms. Pt engaged in shaving sitting at the sink. Pt required mod A to complete clean shave. Pt performed close supervision transfer into the recliner at end of session.   Left sitting up with LE elevated with safety belt donned.   Therapy Documentation Precautions:  Precautions Precautions: Fall Precaution Comments: R weakness UE>LE, R inattention, posterior lean in standing, low BP Restrictions Weight Bearing Restrictions: No Pain: Pain Assessment Pain Scale: 0-10 Pain Score: 0-No pain   Therapy/Group: Individual Therapy  Willeen Cass [facility] 11/07/2019, 11:02 AM

## 2019-11-07 NOTE — Patient Care Conference (Signed)
Inpatient RehabilitationTeam Conference and Plan of Care Update Date: 11/07/2019   Time: 1:27 PM    Patient Name: Jeff [name]      Medical Record Number: [id number]  Date of Birth: Jul 04, 1945 Sex: Male         Room/Bed: 4W21C/4W21C-01 Payor Info: Payor: [employer] MEDICARE / Plan: [facility] MEDICARE / Product Type: *No Product type* /    Admit Date/Time:  11/03/2019  2:46 PM  Primary Diagnosis:  Acute ischemic stroke [facility])  Hospital Problems: Principal Problem:   Acute ischemic stroke [facility]) Active Problems:   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 Alysia Penna Care Coodinator Present: Dorien Chihuahua, RN, BSN, CRRN;Christina Sampson Goon, BSW Nurse Present: Renda Rolls, LPN PT Present: Barrie Folk, PT OT Present: Darleen Crocker, OT SLP Present: Jettie Booze, CF-SLP PPS Coordinator present : Gunnar Fusi, SLP     Current Status/Progress Goal Weekly Team Focus  Bowel/Bladder   Incontinent of bowel and bladder.  To become more continent  Assess tolieting needs often   Swallow/Nutrition/ Hydration             ADL's   mod A LB self care, min A UB self care, mod A toileting, min - mod A functional transfers  supervision  R NMR, self care retraining, cognition, functional transfers, balance   Mobility   CGA bed mobility, CGA to minA transfers, minA gait 138ft with decreased R foot clearance and terminal knee extension  mod bed mobiltiy and transfers, supervision gait  R NMR, balance, gait, d/c planning   Communication   phrase min A  Supervision A  use of speech intelligibility strategies   Safety/Cognition/ Behavioral Observations  Mod-Max A  Min A  basic problem solving, intellectual awareness, and recall   Pain   No complaints of pain.  Remain pain free  Assess pain q  shift or prn.   Skin   has old abrasion to left elbow and has scabbed over. Skin intact.  To prevent breakdown from occurring.  Assess skin q shift or prn.     Team Discussion:  Discharge Planning/Teaching Needs:  Goal to discharge home independent  Will schedule with friend   Current Update: on target  Current Barriers to Discharge:  Decreased caregiver support, Lack of/limited family support and Pending chemo/radiation  Possible Resolutions to Barriers: Recommend intermittent supervision at discharge due to intellectual awareness and recall issues. Oncology held chemo until discharged = outpatient treatment  Patient on target to meet rehab goals: yes  *See Care Plan and progress notes for long and short-term goals.   Revisions to Treatment Plan:  Addressing hypotension with abd binder, TED hose and ace wraps to LE. Toileting protocol continued    Medical Summary Current Status: incont Bowel and bladder due to poor initiation , Weekly Focus/Goal: timed toileting , mechanical orthostasis  Barriers to Discharge: Medical stability;Incontinence   Possible Resolutions to Barriers: Q2 h toileting program, ACE wrapping Abd binder   Continued Need for Acute Rehabilitation Level of Care: The patient requires daily medical management by a physician with specialized training in physical medicine and rehabilitation for the following reasons: Direction of a multidisciplinary physical rehabilitation program to maximize functional independence : Yes Medical management of patient stability for increased activity during participation in an intensive rehabilitation regime.: Yes Analysis of laboratory values and/or radiology reports with any  subsequent need for medication adjustment and/or medical intervention. : Yes   I attest that I was present, lead the team conference, and concur with the assessment and plan of the team.   Dorien Chihuahua B 11/07/2019, 1:27 PM

## 2019-11-08 ENCOUNTER — Ambulatory Visit (HOSPITAL_COMMUNITY): Payer: Medicare Other

## 2019-11-08 ENCOUNTER — Inpatient Hospital Stay (HOSPITAL_COMMUNITY): Payer: Medicare Other | Admitting: Physical Therapy

## 2019-11-08 ENCOUNTER — Inpatient Hospital Stay (HOSPITAL_COMMUNITY): Payer: Medicare Other | Admitting: Occupational Therapy

## 2019-11-08 ENCOUNTER — Inpatient Hospital Stay (HOSPITAL_COMMUNITY): Payer: Medicare Other | Admitting: Speech Pathology

## 2019-11-08 ENCOUNTER — Ambulatory Visit (HOSPITAL_COMMUNITY): Payer: Medicare Other | Admitting: Hematology

## 2019-11-08 ENCOUNTER — Other Ambulatory Visit (HOSPITAL_COMMUNITY): Payer: Medicare Other

## 2019-11-08 MED ORDER — MIDODRINE HCL 5 MG PO TABS
10.0000 mg | ORAL_TABLET | Freq: Three times a day (TID) | ORAL | Status: DC
  Administered 2019-11-08 – 2019-11-20 (×38): 10 mg via ORAL
  Filled 2019-11-08 (×38): qty 2

## 2019-11-08 NOTE — Progress Notes (Signed)
Physical Therapy Session Note  Patient Details  Name: Jeff [name] MRN: [id number] Date of Birth: 15-Sep-1945  Today's Date: 11/08/2019 PT Individual Time: 1470-9295 PT Individual Time Calculation (min): 43 min   Short Term Goals: Week 1:  PT Short Term Goal 1 (Week 1): Patient will perform transfer with CGA. PT Short Term Goal 2 (Week 1): Patient will perform dynamic standing balance with CGA >10 min. PT Short Term Goal 3 (Week 1): Patient will ambulate >100 feet with CGA using LRAD.  Skilled Therapeutic Interventions/Progress Updates:   Pt received sitting in WC and agreeable to PT. PT instructed pt in dynamic balance training to peform reciprocal foot taps on 6 inch cones x 10 BLE.   PT instructed pt in modified [place] level A strengthening program: LAQ, HS curl, hip abduction, sit<>stand, partial squat, each completed x 10 BLE, tandem standing 2 x 10 sec BLE, each performed with UE support with RW. Cues for full ROM, decreased speed and symmetry of movement RvsL.   Throughout session pt performed sit<>stand with supervision assist and min cues foro UE support on arm rest to push into standing. No s/s or orthostatic hypotension. Pt left sitting in recliner with call bell in reach and all needs met.       Therapy Documentation Precautions:  Precautions Precautions: Fall Precaution Comments: R weakness UE>LE, R inattention, posterior lean in standing, low BP Restrictions Weight Bearing Restrictions: No    Vital Signs: Therapy Vitals Temp: 97.6 F (36.4 C) Pulse Rate: (!) 53 Resp: 16 BP: 101/70 Patient Position (if appropriate): Sitting Oxygen Therapy SpO2: 100 % O2 Device: Room Air Pain:   denies  Therapy/Group: Individual Therapy  Lorie Phenix 11/08/2019, 4:46 PM

## 2019-11-08 NOTE — Progress Notes (Addendum)
Occupational Therapy Session Note  Patient Details  Name: Jeff [name] MRN: [id number] Date of Birth: 1945-06-18  Today's Date: 11/08/2019 OT Individual Time: 1000-1100 OT Individual Time Calculation (min): 60 min    Short Term Goals: Week 1:  OT Short Term Goal 1 (Week 1): STG=LTG  Skilled Therapeutic Interventions/Progress Updates:   First session: Pt supine in bed, no complaints of pain, agreeable to OT session. Pt completed bed mobility to EOB with mod I.  Pt completed functional ambulation using RW to bathroom and shower bench transfer with CGA.  OT noted PICC line left chest region therefore deferred bathing in shower. Pt returned to sinkside at w/c with supervision.  Pt bathed and dressed UB and LB with supervision.  Increased time needed and VCs for compensatory techniques during donning of TED Hose.  OT assessed vitals prior to donning TED hose:BP 103/67, pulse 59 O2 100.  Pt returned to recliner with CGA without AD use.  Call bell in reach, seat alarm on.  Pt exhibited good awareness to bilateral sides today.  Improved activity tolerance for self care as well.  Second session: Pt sitting up in recliner, no complaints of pain.  Pt completed functional ambulation to ADL suite for education on shower chair transfer.  Pt completed tub shower transfer with supervision using grab bars and sit<>stand at 3 in 1 commode.  Pt educated on safety strategies in kitchen and pt reports good safety awareness.  Pt retrieved item from refrigerator at RW level with VCs for safe body mechanics and placed in lower cabinet with supervision and no LOB.  Pt retrieved items placed on counter in left and right visual fields with 100% accuracy and no VCs needed to scan in left and right directions.  Pt completed functional ambulation back to room with supervision.  Pt returned to recliner with supervision and call bell in reach, seat alarm on.  Therapy Documentation Precautions:  Precautions Precautions:  Fall Precaution Comments: R weakness UE>LE, R inattention, posterior lean in standing, low BP Restrictions Weight Bearing Restrictions: No   Therapy/Group: Individual Therapy  Ezekiel Slocumb 11/08/2019, 12:16 PM

## 2019-11-08 NOTE — Progress Notes (Signed)
[place] PHYSICAL MEDICINE & REHABILITATION PROGRESS NOTE  Subjective/Complaints:    ROS: Denies CP, shortness of breath, nausea, vomiting, diarrhea.  Objective: Vital Signs: Blood pressure 96/67, pulse (!) 51, temperature 98.4 F (36.9 C), resp. rate 15, height 6' (1.829 m), weight 77.2 kg, SpO2 97 %. No results found. No results for input(s): WBC, HGB, HCT, PLT in the last 72 hours. Recent Labs    11/06/19 0317  NA 134*  K 4.2  CL 102  CO2 23  GLUCOSE 95  BUN 19  CREATININE 1.61*  CALCIUM 8.0*    Physical Exam: BP 96/67 (BP Location: Right Arm)    Pulse (!) 51    Temp 98.4 F (36.9 C)    Resp 15    Ht 6' (1.829 m)    Wt 77.2 kg    SpO2 97%    BMI 23.08 kg/m   General: No acute distress Mood and affect are appropriate Heart: Regular rate and rhythm no rubs murmurs or extra sounds Lungs: Clear to auscultation, breathing unlabored, no rales or wheezes Abdomen: Positive bowel sounds, soft nontender to palpation, nondistended Extremities: No clubbing, cyanosis, or edema Skin: No evidence of breakdown, no evidence of rash   Dysarthria Mild left inattention.  He is able to follow simple one step commands.  Motor: RUE: 4+/5 proximal distal, unchanged RLE: 4+/5 proximal distal LUE/LLE: 5/5 proximal distal    Assessment/Plan: 1. Functional deficits secondary to acute left caudate and putamen infarct which require 3+ hours per day of interdisciplinary therapy in a comprehensive inpatient rehab setting.  Physiatrist is providing close team supervision and 24 hour management of active medical problems listed below.  Physiatrist and rehab team continue to assess barriers to discharge/monitor patient progress toward functional and medical goals  Care Tool:  Bathing    Body parts bathed by patient: Right arm, Left arm, Chest, Abdomen, Front perineal area, Right upper leg, Left upper leg, Face   Body parts bathed by helper: Right lower leg, Left lower leg, Buttocks      Bathing assist Assist Level: Moderate Assistance - Patient 50 - 74%     Upper Body Dressing/Undressing Upper body dressing   What is the patient wearing?: Pull over shirt    Upper body assist Assist Level: Minimal Assistance - Patient > 75%    Lower Body Dressing/Undressing Lower body dressing      What is the patient wearing?: Underwear/pull up, Pants     Lower body assist Assist for lower body dressing: Moderate Assistance - Patient 50 - 74%     Toileting Toileting    Toileting assist Assist for toileting: Contact Guard/Touching assist     Transfers Chair/bed transfer  Transfers assist     Chair/bed transfer assist level: Minimal Assistance - Patient > 75%     Locomotion Ambulation   Ambulation assist      Assist level: Contact Guard/Touching assist Assistive device: Walker-rolling Max distance: 150   Walk 10 feet activity   Assist     Assist level: Contact Guard/Touching assist Assistive device: Walker-rolling   Walk 50 feet activity   Assist Walk 50 feet with 2 turns activity did not occur: Safety/medical concerns (decreased activity tolerance)  Assist level: Contact Guard/Touching assist Assistive device: Walker-rolling    Walk 150 feet activity   Assist Walk 150 feet activity did not occur: Safety/medical concerns  Assist level: Contact Guard/Touching assist Assistive device: Walker-rolling    Walk 10 feet on uneven surface  activity   Assist  Walk 10 feet on uneven surfaces activity did not occur: Safety/medical concerns (decreased balance/activity tolerance)         Wheelchair     Assist Will patient use wheelchair at discharge?: No (No long term goals set by PT) Type of Wheelchair: Manual    Wheelchair assist level: Supervision/Verbal cueing Max wheelchair distance: 81 ft    Wheelchair 50 feet with 2 turns activity    Assist        Assist Level: Supervision/Verbal cueing   Wheelchair 150 feet  activity     Assist     Assist Level: Moderate Assistance - Patient 50 - 74%      Medical Problem List and Plan: 1.  Dysarthria, right hemiparesis with numbness, right inattention and impulsivity secondary to acute left caudate and putamen infarct with involvement in insular, fronto-parietal cortical and subcortical brain and mild swelling.   Continue CIR, 2.  Antithrombotics: -DVT/anticoagulation  Pharmaceutical: Lovenox             -antiplatelet therapy: Low dose ASA 3. Pain Management: Well controlled.  4. Mood: LCSW to follow for evaluation and support.              -antipsychotic agents: N/a 5. Neuropsych: This patient is ?fully capable of making decisions on his own behalf. 6. Skin/Wound Care: Routine pressure relief measures.  7. Fluids/Electrolytes/Nutrition: Monitor I/Os.  CMP ordered. 8. Hypotension: Continue to monitor BP   ProAmatine increased to 10 mg tid on 7/11  Symptomatic orthostasis with near syncopal episode on 7/11, requiring patient to return to bed  Teds/abdominal binder ordered  500 cc fluid bolus given, see #14-slow infusion given poor EF.  Will need to monitor for fluid overload closely.  7/12: Give additional 500cc bolus given hypotension and uptrending Creatinine.   7/13: Asymptomatic othostasis today- better controlled with thigh high TEDs. - no repeat orthostatic vitals in flowsheet              Monitor with increased mobility  9. 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 Delton Coombes. Last infusion  06/24  Discussed with pharmacy, appreciate input -due again 7/15.  Will follow up with oncology and Compazine post chemo orders if necessary 10. CKD IIIb: Baseline SCr 1.7-1.8 range per records. Will continue to monitor.              Creatinine 1.47 on 7/10, 1.65 on 7/12, 1.61 on 7/13. - at baseline cont to monitor weekly  11. Polysubstance abuse: Continue to counsel on cessation.  12. Anemia of  chronic disease:  Will continue to monitor.              Hemoglobin 9.6 on 7/11, 8.8 on 7/12 13.  Hyponatremia  Sodium 133 on 7/10, 131 on 7/12  May be contributing to #8  Twice daily salt tabs ordered  7/13: Na is 134 14.  Combined CHF- no clinical signs  Filed Weights   11/05/19 0535 11/06/19 0454 11/07/19 0601  Weight: 69.4 kg 70.6 kg 77.2 kg   Daily weights ordered- up 7 kg?- no peripheral edema, lung exam nl   Echo reviewed-EF of 35%  LOS: 5 days A FACE TO FACE EVALUATION WAS PERFORMED  Charlett Blake 11/08/2019, 9:09 AM

## 2019-11-08 NOTE — Progress Notes (Signed)
Speech Language Pathology Daily Session Note  Patient Details  Name: Jeff [name] MRN: [id number] Date of Birth: November 19, 1945  Today's Date: 11/08/2019 SLP Individual Time: 7939-0300 SLP Individual Time Calculation (min): 42 min  Short Term Goals: Week 1: SLP Short Term Goal 1 (Week 1): STG=LTG due to ELOS  Skilled Therapeutic Interventions: Pt was seen for skilled ST targeting cognitive goals. During basic money management tasks, pt could display set amounts of change with Supervision A. However, increased Mod faded to Min A verbal and visual cues for use of a problem solving strategy for calculating totals and change (adding and subtracting). Min A verbal and visual cues also provided for error awareness. Pt did demonstrate learning and ability to carryover strategies learned during session. During a basic medication management task, pt required Moderate verbal cues for problem solving when interpreting basic medication labels and organizing a simple QID pill chart. Pt left laying in bed with alarm set and needs within reach. Continue per current plan of care.        Pain Pain Assessment Pain Scale: 0-10 Pain Score: 0-No pain  Therapy/Group: Individual Therapy  Arbutus Leas 11/08/2019, 7:08 AM

## 2019-11-09 ENCOUNTER — Inpatient Hospital Stay (HOSPITAL_COMMUNITY): Payer: Medicare Other | Admitting: Physical Therapy

## 2019-11-09 ENCOUNTER — Inpatient Hospital Stay (HOSPITAL_COMMUNITY): Payer: Medicare Other | Admitting: Occupational Therapy

## 2019-11-09 NOTE — Progress Notes (Signed)
Occupational Therapy Session Note  Patient Details  Name: Josaiah Muhammed MRN: [id number] Date of Birth: [birth year]  Today's Date: 11/09/2019 OT Individual Time: 2122-4825 OT Individual Time Calculation (min): 56 min   Short Term Goals: Week 1:  OT Short Term Goal 1 (Week 1): STG=LTG  Skilled Therapeutic Interventions/Progress Updates:    Pt greeted semi-reclined in bed and agreeable to OT treatment session. Pt with thigh high TEDs already donned. Pt completed bed mobility mod I, then stand--pivot to wc with CGA. Pt brought to the sink in wc and completed UB bathing and washed face. Pt declined LB bathing. Pt donned shirt with set-up A, then donned pants with increased time and CGA for balance when standing to pull up pants. OT checked BP with only TED hose on. Sitting:99/66 Standing: 77/59- with associated dizziness Symptoms improved when pt returned to sitting. Pt brought to therapy gym and completed UB there-ex with 5 mins x2 forward and backward on [employer] arm bike. Worked on fine motor coordination with graded peg board actvity focused on in-hand manipulation, translation, and rotation of smaller pegs. Pt returned to room and completed stand-pivot back to bed with close supervision. Pt left semi-reclined in bed with bed alarm on, call bell in reach, and needs met.   Therapy Documentation Precautions:  Precautions Precautions: Fall Precaution Comments: R weakness UE>LE, R inattention, posterior lean in standing, low BP Restrictions Weight Bearing Restrictions: No Pain:  denies pain  Therapy/Group: Individual Therapy  Valma Cava 11/09/2019, 8:03 AM

## 2019-11-09 NOTE — Progress Notes (Signed)
[place] PHYSICAL MEDICINE & REHABILITATION PROGRESS NOTE  Subjective/Complaints:    No new complaints. Asked when he might be going home  ROS: Patient denies fever, rash, sore throat, blurred vision, nausea, vomiting, diarrhea, cough, shortness of breath or chest pain, joint or back pain, headache, or mood change.     Objective: Vital Signs: Blood pressure (!) 92/58, pulse (!) 53, temperature 98.7 F (37.1 C), resp. rate 16, height 6' (1.829 m), weight 72.2 kg, SpO2 98 %. No results found. No results for input(s): WBC, HGB, HCT, PLT in the last 72 hours. No results for input(s): NA, K, CL, CO2, GLUCOSE, BUN, CREATININE, CALCIUM in the last 72 hours.  Physical Exam: BP (!) 92/58 (BP Location: Right Arm)   Pulse (!) 53   Temp 98.7 F (37.1 C)   Resp 16   Ht 6' (1.829 m)   Wt 72.2 kg   SpO2 98%   BMI 21.59 kg/m   Constitutional: No distress . Vital signs reviewed. HEENT: EOMI, oral membranes moist Neck: supple Cardiovascular: RRR without murmur. No JVD    Respiratory/Chest: CTA Bilaterally without wheezes or rales. Normal effort    GI/Abdomen: BS +, non-tender, non-distended Ext: no clubbing, cyanosis, or edema Psych: pleasant and cooperative Dysarthria Mild left inattention.  He is able to follow simple one step commands.  Motor: RUE: 4+/5 proximal distal, unchanged RLE: 4+/5 proximal distal LUE/LLE: 5/5 proximal distal    Assessment/Plan: 1. Functional deficits secondary to acute left caudate and putamen infarct which require 3+ hours per day of interdisciplinary therapy in a comprehensive inpatient rehab setting.  Physiatrist is providing close team supervision and 24 hour management of active medical problems listed below.  Physiatrist and rehab team continue to assess barriers to discharge/monitor patient progress toward functional and medical goals  Care Tool:  Bathing    Body parts bathed by patient: Right arm, Left arm, Chest, Abdomen, Front perineal  area, Right upper leg, Left upper leg, Face, Left lower leg, Right lower leg, Buttocks   Body parts bathed by helper: Right lower leg, Left lower leg, Buttocks     Bathing assist Assist Level: Supervision/Verbal cueing     Upper Body Dressing/Undressing Upper body dressing   What is the patient wearing?: Pull over shirt    Upper body assist Assist Level: Set up assist    Lower Body Dressing/Undressing Lower body dressing      What is the patient wearing?: Pants, Incontinence brief     Lower body assist Assist for lower body dressing: Minimal Assistance - Patient > 75%     Toileting Toileting    Toileting assist Assist for toileting: Supervision/Verbal cueing     Transfers Chair/bed transfer  Transfers assist     Chair/bed transfer assist level: Minimal Assistance - Patient > 75%     Locomotion Ambulation   Ambulation assist      Assist level: Contact Guard/Touching assist Assistive device: Walker-rolling Max distance: 150   Walk 10 feet activity   Assist     Assist level: Contact Guard/Touching assist Assistive device: Walker-rolling   Walk 50 feet activity   Assist Walk 50 feet with 2 turns activity did not occur: Safety/medical concerns (decreased activity tolerance)  Assist level: Contact Guard/Touching assist Assistive device: Walker-rolling    Walk 150 feet activity   Assist Walk 150 feet activity did not occur: Safety/medical concerns  Assist level: Contact Guard/Touching assist Assistive device: Walker-rolling    Walk 10 feet on uneven surface  activity  Assist Walk 10 feet on uneven surfaces activity did not occur: Safety/medical concerns (decreased balance/activity tolerance)         Wheelchair     Assist Will patient use wheelchair at discharge?: No (No long term goals set by PT) Type of Wheelchair: Manual    Wheelchair assist level: Supervision/Verbal cueing Max wheelchair distance: 81 ft    Wheelchair 50  feet with 2 turns activity    Assist        Assist Level: Supervision/Verbal cueing   Wheelchair 150 feet activity     Assist     Assist Level: Moderate Assistance - Patient 50 - 74%      Medical Problem List and Plan: 1.  Dysarthria, right hemiparesis with numbness, right inattention and impulsivity secondary to acute left caudate and putamen infarct with involvement in insular, fronto-parietal cortical and subcortical brain and mild swelling.   Continue CIR. Looks like family cannot provide assist---may need SNF  -described rehab process/team conferences with patient who understood 2.  Antithrombotics: -DVT/anticoagulation  Pharmaceutical: Lovenox             -antiplatelet therapy: Low dose ASA 3. Pain Management: Well controlled.  4. Mood: LCSW to follow for evaluation and support.              -antipsychotic agents: N/a 5. Neuropsych: This patient is ?fully capable of making decisions on his own behalf. 6. Skin/Wound Care: Routine pressure relief measures.  7. Fluids/Electrolytes/Nutrition: Monitor I/Os.  CMP ordered. 8. Hypotension: Continue to monitor BP   ProAmatine increased to 10 mg tid on 7/11  Symptomatic orthostasis with near syncopal episode on 7/11, requiring patient to return to bed.   7/13: Asymptomatic othostasis today- better controlled with thigh high TEDs. - no repeat orthostatic vitals in flowsheet              7/16 still orthostatic, bp's soft in supine as well--observe for activity tolerance    -continue TEDs, binders 9. 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 Delton Coombes. Last infusion  06/24 and 7/15.    -compazine prn for nausea 10. CKD IIIb: Baseline SCr 1.7-1.8 range per records. Will continue to monitor.              Creatinine 1.47 on 7/10, 1.65 on 7/12, 1.61 on 7/13. - at baseline cont to monitor weekly  11. Polysubstance abuse: Continue to counsel on cessation.  12. Anemia of  chronic disease:  Will continue to monitor.              Hemoglobin 9.6 on 7/11, 8.8 on 7/12 13.  Hyponatremia  Sodium 133 on 7/10, 131 on 7/12  May be contributing to #8  Twice daily salt tabs ordered  7/13: Na is 134 14.  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 72kg.   -+1441 since admit---continue to monitor.  -doesn't appear fluid overloaded  LOS: 6 days A FACE TO [place] 11/09/2019, 12:08 PM

## 2019-11-09 NOTE — Progress Notes (Addendum)
Physical Therapy Session Note  Patient Details  Name: Jeff [name] MRN: [id number] Date of Birth: [birth year]  Today's Date: 11/09/2019 PT Individual Time: 1101-1200 1359-1500 PT Individual Time Calculation (min): 59 min and 61 minutes   Short Term Goals: Week 1:  PT Short Term Goal 1 (Week 1): Patient will perform transfer with CGA. PT Short Term Goal 2 (Week 1): Patient will perform dynamic standing balance with CGA >10 min. PT Short Term Goal 3 (Week 1): Patient will ambulate >100 feet with CGA using LRAD.  Skilled Therapeutic Interventions/Progress Updates:    first session: pt received in bed and agreeable to therapy. Abdominal binder donned at EOB, TED hose alright in place upon start of session. Pt reported no dizziness or lightheadedness and directed supine>sit at supervision; sit to stand at EOB at supervision and transfer to [facility] at supervision. Pt taken to gym in [facility] and directed in 2x5 STS from [facility] level in parallel bars with verbal cues for hand placement and safety awareness. Pt denied any symptoms of dizziness of lightheadedness. Pt directed in standing toe taps at cones with BLE 2x10 each with VC for weight shifting, and increased trunk extension; x5 each BLE without UE support attempted with pt requiring min A on one instance for righting balance and CGA for other reps; x4 lateral stepping in parallel bars with BUE support at [facility] with Verbal cues for technique.  Pt directed in dynamic standing balance on level surface without UE support in forward reaching activity with duel task matching with use of BUE to complete task at CGA throughout, slight sway noted but no LOB, verbal cues for in BOS. Pt directed in gait training for 150' x2 with Rolling walker  with seated rest break between reps with environment navigation required and x10 objects given for pt to identify while walking, pt able to find these and verbalize where located however required extra time and demoed decreased cadence  and verbal cues for head turns, CGA grossly. Second rep, pt directed in categorical listing during gait training with x2 attempts given for pt to name x5 however pt unable to name any in either category while walking and demonstrated poor duel tasking with gait, though remained CGA. Pt directed in 2x15 standing marching with Rolling walker, hip abduction/aduction and seated LAQs for BLE strengthening. Pt left in recliner with alarm set, All needs in reach and in good condition. Call light in hand.   Second session: pt received in chair and agreeable to therapy. Pt directed in: 4x15 clothes pin dynamic standing balance training on level surface without UE support with rest breaks between reps, grossly CGA with VC for weight shifting to achieve/return to midline standing posture and for inc BOS. Pt directed in object search in open environment with x10 objects with Rolling walker used for support with reaching in multiplanar directions at various heights and distances at grossly CGA with VC for walker proximity and safety awareness though minimal. Pt directed in 4x10 in R and L directions for lateral stepping at hall rail at [place] with BUE support and VC for tech and posture to improve hip strength and improve standing balance. Pt directed in gait training with Rolling walker for 150' at [facility] with VC for gait pattern and head carriage. Pt also directed in 2x7 sit<>stands from recliner in room at supervision.  Pt left with alarm set, All needs in reach and in good condition. Call light in hand.    Therapy Documentation Precautions:  Precautions Precautions: Fall  Precaution Comments: R weakness UE>LE, R inattention, posterior lean in standing, low BP Restrictions Weight Bearing Restrictions: No Pain:         Therapy/Group: Individual Therapy  Junie Panning 11/09/2019, 3:56 PM

## 2019-11-09 NOTE — Progress Notes (Signed)
Patient ID: Jeff [name], male   DOB: 13-Nov-1945, 74 y.o.   MRN: [id number]   [name] called sw and does not feel like he can take care of patient at home. Would like to look into skilled nursing. SW will provide [name] with list of SNF and Medicaid information.

## 2019-11-09 NOTE — Progress Notes (Signed)
Patient ID: Jeff [name], male   DOB: [birth year], 74 y.o.   MRN: [id number]   Tub Transfer Bench and Bedside Commode ordered through [employer]

## 2019-11-09 NOTE — Progress Notes (Signed)
Patient's last bowel movement charted 7/11, asked patient when last bowel movement was and he states "I don't remember" miralax given with no success. Nurse recommended use of suppository or enema and patient refused. Patient states he does not feel any discomfort or constipation.

## 2019-11-10 LAB — CREATININE, SERUM
Creatinine, Ser: 1.49 mg/dL — ABNORMAL HIGH (ref 0.61–1.24)
GFR calc Af Amer: 54 mL/min — ABNORMAL LOW (ref >60)
GFR calc non Af Amer: 46 mL/min — ABNORMAL LOW (ref >60)

## 2019-11-10 NOTE — Progress Notes (Signed)
[place] PHYSICAL MEDICINE & REHABILITATION PROGRESS NOTE  Subjective/Complaints:  No new complaints. Slept well  ROS: Patient denies fever, rash, sore throat, blurred vision, nausea, vomiting, diarrhea, cough, shortness of breath or chest pain, joint or back pain, headache, or mood change. .     Objective: Vital Signs: Blood pressure (!) 93/59, pulse 62, temperature 98.7 F (37.1 C), temperature source Oral, resp. rate 16, height 6' (1.829 m), weight 71.7 kg, SpO2 98 %. No results found. No results for input(s): WBC, HGB, HCT, PLT in the last 72 hours. Recent Labs    11/10/19 0425  CREATININE 1.49*    Physical Exam: BP (!) 93/59 (BP Location: Right Arm)   Pulse 62   Temp 98.7 F (37.1 C) (Oral)   Resp 16   Ht 6' (1.829 m)   Wt 71.7 kg   SpO2 98%   BMI 21.44 kg/m   Constitutional: No distress . Vital signs reviewed. HEENT: EOMI, oral membranes moist Neck: supple Cardiovascular: RRR without murmur. No JVD    Respiratory/Chest: CTA Bilaterally without wheezes or rales. Normal effort    GI/Abdomen: BS +, non-tender, non-distended Ext: no clubbing, cyanosis, or edema Psych: pleasant and cooperative Dysarthria Mild left inattention ongoing  He is able to follow simple one step commands. Fair awareness, ?STM deficits Motor: RUE: 4+/5 proximal distal, unchanged RLE: 4+/5 proximal distal LUE/LLE: 5/5 proximal distal    Assessment/Plan: 1. Functional deficits secondary to acute left caudate and putamen infarct which require 3+ hours per day of interdisciplinary therapy in a comprehensive inpatient rehab setting.  Physiatrist is providing close team supervision and 24 hour management of active medical problems listed below.  Physiatrist and rehab team continue to assess barriers to discharge/monitor patient progress toward functional and medical goals  Care Tool:  Bathing    Body parts bathed by patient: Right arm, Left arm, Chest, Abdomen, Front perineal area,  Right upper leg, Left upper leg, Face, Left lower leg, Right lower leg, Buttocks   Body parts bathed by helper: Right lower leg, Left lower leg, Buttocks     Bathing assist Assist Level: Supervision/Verbal cueing     Upper Body Dressing/Undressing Upper body dressing   What is the patient wearing?: Pull over shirt    Upper body assist Assist Level: Set up assist    Lower Body Dressing/Undressing Lower body dressing      What is the patient wearing?: Pants, Incontinence brief     Lower body assist Assist for lower body dressing: Minimal Assistance - Patient > 75%     Toileting Toileting    Toileting assist Assist for toileting: Supervision/Verbal cueing     Transfers Chair/bed transfer  Transfers assist     Chair/bed transfer assist level: Minimal Assistance - Patient > 75%     Locomotion Ambulation   Ambulation assist      Assist level: Contact Guard/Touching assist Assistive device: Walker-rolling Max distance: 150   Walk 10 feet activity   Assist     Assist level: Contact Guard/Touching assist Assistive device: Walker-rolling   Walk 50 feet activity   Assist Walk 50 feet with 2 turns activity did not occur: Safety/medical concerns (decreased activity tolerance)  Assist level: Contact Guard/Touching assist Assistive device: Walker-rolling    Walk 150 feet activity   Assist Walk 150 feet activity did not occur: Safety/medical concerns  Assist level: Contact Guard/Touching assist Assistive device: Walker-rolling    Walk 10 feet on uneven surface  activity   Assist Walk 10 feet  on uneven surfaces activity did not occur: Safety/medical concerns (decreased balance/activity tolerance)         Wheelchair     Assist Will patient use wheelchair at discharge?: No (No long term goals set by PT) Type of Wheelchair: Manual    Wheelchair assist level: Supervision/Verbal cueing Max wheelchair distance: 81 ft    Wheelchair 50 feet  with 2 turns activity    Assist        Assist Level: Supervision/Verbal cueing   Wheelchair 150 feet activity     Assist     Assist Level: Moderate Assistance - Patient 50 - 74%      Medical Problem List and Plan: 1.  Dysarthria, right hemiparesis with numbness, right inattention and impulsivity secondary to acute left caudate and putamen infarct with involvement in insular, fronto-parietal cortical and subcortical brain and mild swelling.   Continue CIR. Looks like family cannot provide assist---may need SNF    -decision perhaps on Monday 2.  Antithrombotics: -DVT/anticoagulation  Pharmaceutical: Lovenox             -antiplatelet therapy: Low dose ASA 3. Pain Management: Well controlled.  4. Mood: LCSW to follow for evaluation and support.              -antipsychotic agents: N/a 5. Neuropsych: This patient is ?fully capable of making decisions on his own behalf. 6. Skin/Wound Care: Routine pressure relief measures.  7. Fluids/Electrolytes/Nutrition: Monitor I/Os.  CMP ordered. 8. Hypotension: Continue to monitor BP   ProAmatine increased to 10 mg tid on 7/11  Symptomatic orthostasis with near syncopal episode on 7/11, requiring patient to return to bed.   7/13: Asymptomatic othostasis today- better controlled with thigh high TEDs. - no repeat orthostatic vitals in flowsheet              7/16-17 still orthostatic, bp's soft in supine as well--pt denied any dizziness or other symptoms with therapy yesterday   -continue TEDs, binder 9. 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 Delton Coombes. Last infusion  06/24 and 7/15.    -compazine prn for nausea 10. CKD IIIb: Baseline SCr 1.7-1.8 range per records. Will continue to monitor.              Creatinine 1.47 on 7/10, 1.65 on 7/12, 1.61 on 7/13. - at baseline cont to monitor weekly  11. Polysubstance abuse: Continue to counsel on cessation.  12. Anemia of chronic  disease:  Will continue to monitor.              Hemoglobin 9.6 on 7/11, 8.8 on 7/12 13.  Hyponatremia  Sodium 133 on 7/10, 131 on 7/12  May be contributing to #8  Twice daily salt tabs ordered  7/13: Na is 134 14.  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 72kg 7/16--back to 71.7 7/17   -+1900 since admit---if further trend up, consider low dose diuretic  -doesn't appear fluid overloaded  LOS: 7 days A FACE TO FACE EVALUATION WAS PERFORMED  Meredith Staggers 11/10/2019, 11:34 AM

## 2019-11-10 NOTE — Progress Notes (Signed)
Physical Therapy Weekly Progress Note  Patient Details  Name: Jeff [name] MRN: [id number] Date of Birth: 07/02/1945  Beginning of progress report period: November 04, 2019 End of progress report period: November 10, 2019  Today's Date: 11/10/2019 PT Individual Time: 1430-1510 PT Individual Time Calculation (min): 40 min   Patient has met 3 of 3 short term goals.  Pt is making steady progress towards goals and has improved to supervision assist with RW for gait and transfers and CGA for gait and transfers without AD. Pt is still currently limited by orthostatic hypotension requiring thigh high teds and abdominal binder to prevent risk of vasovagal response.   Patient continues to demonstrate the following deficits muscle weakness and muscle joint tightness, decreased cardiorespiratoy endurance, decreased coordination and decreased standing balance, decreased postural control, hemiplegia and decreased balance strategies and therefore will continue to benefit from skilled PT intervention to increase functional independence with mobility.  Patient progressing toward long term goals..  Continue plan of care.  PT Short Term Goals Week 1:  PT Short Term Goal 1 (Week 1): Patient will perform transfer with CGA. PT Short Term Goal 1 - Progress (Week 1): Met PT Short Term Goal 2 (Week 1): Patient will perform dynamic standing balance with CGA >10 min. PT Short Term Goal 2 - Progress (Week 1): Met PT Short Term Goal 3 (Week 1): Patient will ambulate >100 feet with CGA using LRAD. PT Short Term Goal 3 - Progress (Week 1): Discontinued (comment) Week 2:  PT Short Term Goal 1 (Week 2): STG=LTG due to ELOS  Skilled Therapeutic Interventions/Progress Updates:      Pt received supine in bed and agreeable to PT. Supine>sit transfer with out assist or cues from PT. PT applied tde hose  In supine. Once sitting OEB PT assessed VS:seated: 89/63  HR 70. Standing with no binder 66/47, HR 98, no s/s. Standing with  binder present 77/47, HE 94. Pt performed gait training through hall x 150ft withsupervision assist from PT for safety with cues for AD management in turns. VS assessment following gait, 82/64, HR 128, pt remained asymptomatic. Sitting 108/71, HR 66.   PT instructed p tin gait training without Ad x 70ft and 150ft, supervision assist with occasional min assist with fatigue due to lateral R LOB, cues decreased speed, improved heel contact on the R, and decreased foot drag. Pt able to adjust cues for gait pattern following instruction, but only able to sustain for approx 5-10 steps.   Patient returned to room and performed stand pivot to recliner with no AD and CGA. Pt left sitting in recliner with call bell in reach and all needs met.      Therapy Documentation Precautions:  Precautions Precautions: Fall Precaution Comments: R weakness UE>LE, R inattention, posterior lean in standing, low BP Restrictions Weight Bearing Restrictions: No    Vital Signs: Therapy Vitals Temp: 97.8 F (36.6 C) Pulse Rate: (!) 55 Resp: 14 BP: (!) 108/58 Patient Position (if appropriate): Sitting Oxygen Therapy SpO2: 100 % O2 Device: Room Air   Therapy/Group: Individual Therapy  Austin E Tucker 11/10/2019, 3:11 PM  

## 2019-11-11 NOTE — Progress Notes (Signed)
Family friend, [name], who is also the POA visited today and said he wanted the doctor to give him a call whenever he had a chance, his number is [phone] Jeff [name], friend, POA). He is also requesting the social worker give him a call as well to discuss community resources.

## 2019-11-11 NOTE — Progress Notes (Signed)
[place] PHYSICAL MEDICINE & REHABILITATION PROGRESS NOTE  Subjective/Complaints:  pt comfortable. No complaints. Asked me for the third straight day what his dc date was going to be.  ROS: Patient denies fever, rash, sore throat, blurred vision, nausea, vomiting, diarrhea, cough, shortness of breath or chest pain, joint or back pain, headache, or mood change.     Objective: Vital Signs: Blood pressure (!) 88/62, pulse 97, temperature 98.6 F (37 C), temperature source Oral, resp. rate 15, height 6' (1.829 m), weight 71 kg, SpO2 100 %. No results found. No results for input(s): WBC, HGB, HCT, PLT in the last 72 hours. Recent Labs    11/10/19 0425  CREATININE 1.49*    Physical Exam: BP (!) 88/62 (BP Location: Left Arm)   Pulse 97   Temp 98.6 F (37 C) (Oral)   Resp 15   Ht 6' (1.829 m)   Wt 71 kg   SpO2 100%   BMI 21.23 kg/m   Constitutional: No distress . Vital signs reviewed. HEENT: EOMI, oral membranes moist Neck: supple Cardiovascular: RRR without murmur. No JVD    Respiratory/Chest: CTA Bilaterally without wheezes or rales. Normal effort    GI/Abdomen: BS +, non-tender, non-distended Ext: no clubbing, cyanosis, or edema Psych: pleasant and cooperative Dysarthric but intelligible Mild left inattention ongoing  He is able to follow simple one step commands. Fair awareness, STM deficits Motor: RUE: 4+/5 proximal distal, unchanged RLE: 4+/5 proximal distal LUE/LLE: 5/5 proximal distal    Assessment/Plan: 1. Functional deficits secondary to acute left caudate and putamen infarct which require 3+ hours per day of interdisciplinary therapy in a comprehensive inpatient rehab setting.  Physiatrist is providing close team supervision and 24 hour management of active medical problems listed below.  Physiatrist and rehab team continue to assess barriers to discharge/monitor patient progress toward functional and medical goals  Care Tool:  Bathing    Body parts  bathed by patient: Right arm, Left arm, Chest, Abdomen, Front perineal area, Right upper leg, Left upper leg, Face, Left lower leg, Right lower leg, Buttocks   Body parts bathed by helper: Right lower leg, Left lower leg, Buttocks     Bathing assist Assist Level: Supervision/Verbal cueing     Upper Body Dressing/Undressing Upper body dressing   What is the patient wearing?: Pull over shirt    Upper body assist Assist Level: Set up assist    Lower Body Dressing/Undressing Lower body dressing      What is the patient wearing?: Pants, Incontinence brief     Lower body assist Assist for lower body dressing: Minimal Assistance - Patient > 75%     Toileting Toileting    Toileting assist Assist for toileting: Supervision/Verbal cueing     Transfers Chair/bed transfer  Transfers assist     Chair/bed transfer assist level: Minimal Assistance - Patient > 75%     Locomotion Ambulation   Ambulation assist      Assist level: Contact Guard/Touching assist Assistive device: Walker-rolling Max distance: 150   Walk 10 feet activity   Assist     Assist level: Contact Guard/Touching assist Assistive device: Walker-rolling   Walk 50 feet activity   Assist Walk 50 feet with 2 turns activity did not occur: Safety/medical concerns (decreased activity tolerance)  Assist level: Contact Guard/Touching assist Assistive device: Walker-rolling    Walk 150 feet activity   Assist Walk 150 feet activity did not occur: Safety/medical concerns  Assist level: Contact Guard/Touching assist Assistive device: Walker-rolling  Walk 10 feet on uneven surface  activity   Assist Walk 10 feet on uneven surfaces activity did not occur: Safety/medical concerns (decreased balance/activity tolerance)         Wheelchair     Assist Will patient use wheelchair at discharge?: No (No long term goals set by PT) Type of Wheelchair: Manual    Wheelchair assist level:  Supervision/Verbal cueing Max wheelchair distance: 81 ft    Wheelchair 50 feet with 2 turns activity    Assist        Assist Level: Supervision/Verbal cueing   Wheelchair 150 feet activity     Assist     Assist Level: Moderate Assistance - Patient 50 - 74%      Medical Problem List and Plan: 1.  Dysarthria, right hemiparesis with numbness, right inattention and impulsivity secondary to acute left caudate and putamen infarct with involvement in insular, fronto-parietal cortical and subcortical brain and mild swelling.   Continue CIR. Looks like family cannot provide assist---may need SNF    -decision perhaps on Monday 2.  Antithrombotics: -DVT/anticoagulation  Pharmaceutical: Lovenox             -antiplatelet therapy: Low dose ASA 3. Pain Management: Well controlled.  4. Mood: LCSW to follow for evaluation and support.              -antipsychotic agents: N/a 5. Neuropsych: This patient is ?fully capable of making decisions on his own behalf. 6. Skin/Wound Care: Routine pressure relief measures.  7. Fluids/Electrolytes/Nutrition: Monitor I/Os.  CMP ordered. 8. Hypotension: Continue to monitor BP   ProAmatine increased to 10 mg tid on 7/11  Symptomatic orthostasis with near syncopal episode on 7/11, requiring patient to return to bed.   7/13: Asymptomatic othostasis today- better controlled with thigh high TEDs. - no repeat orthostatic vitals in flowsheet              7/16-18 still orthostatic, bp's soft in supine as well--pt denied any dizziness or other symptoms with therapy yesterday   -continue TEDs, binder 9. 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 Delton Coombes. Last infusion  06/24 and 7/15.    -compazine prn for nausea 10. CKD IIIb: Baseline SCr 1.7-1.8 range per records. Will continue to monitor.              Creatinine 1.47 on 7/10, 1.65 on 7/12, 1.49 on 7/17. - at baseline cont to monitor weekly  11.  Polysubstance abuse: Continue to counsel on cessation.  12. Anemia of chronic disease:  Will continue to monitor.              Hemoglobin 9.6 on 7/11, 8.8 on 7/12 13.  Hyponatremia  Sodium 133 on 7/10, 131 on 7/12  May be contributing to #8  Twice daily salt tabs ordered  7/13: Na is 134 14.  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 72kg 7/16--back to 71kg today  7/18 +2279cc since admit---if further trend up, consider low dose diuretic    -doesn't appear fluid overloaded however    -check BMET and weight again tomorrow  LOS: 8 days A FACE TO FACE EVALUATION WAS PERFORMED  Meredith Staggers 11/11/2019, 11:16 AM

## 2019-11-12 ENCOUNTER — Inpatient Hospital Stay (HOSPITAL_COMMUNITY): Payer: Medicare Other

## 2019-11-12 LAB — CBC
HCT: 28.2 % — ABNORMAL LOW (ref 39.0–52.0)
Hemoglobin: 8.8 g/dL — ABNORMAL LOW (ref 13.0–17.0)
MCH: 28.4 pg (ref 26.0–34.0)
MCHC: 31.2 g/dL (ref 30.0–36.0)
MCV: 91 fL (ref 80.0–100.0)
Platelets: 441 10*3/uL — ABNORMAL HIGH (ref 150–400)
RBC: 3.1 MIL/uL — ABNORMAL LOW (ref 4.22–5.81)
RDW: 17.7 % — ABNORMAL HIGH (ref 11.5–15.5)
WBC: 8.4 10*3/uL (ref 4.0–10.5)
nRBC: 0 % (ref 0.0–0.2)

## 2019-11-12 NOTE — Progress Notes (Signed)
Patient ID: Jeff [name], male   DOB: May 23, 1945, 74 y.o.   MRN: [id number]  Sw followed up with [name]). Jeff [name] reported seeing patient very weak and in bed over weekend. Is questionable about if patient will be able to return home with intermittent assistance. Sw will follow up wit therapist to see how patient is progressing thus far and their recommendations, if any changes. SNF locations sent to friend on Friday, sw followed up. No preference, referral sent out to all facilities in [facility] in case friend decides not to transition patient home on Wednesday. Sw will also provide friend with home care agencies.   Friend requesting to speak with MD regarding hospice. SW will also follow up with patient advocate services.

## 2019-11-12 NOTE — Progress Notes (Signed)
Patient ID: Jeff [name], male   DOB: 11-10-1945, 74 y.o.   MRN: [id number]   Sw followed up wit patient's friend Jeff [name] on recommendations and updates from therapy. Friend would like to continue to peruse SNF placement. Patient has a bed offer at [facility], facility beginning authorization and will inform sw will patient can transfer. Jeff [name] will speak with patient about discharge.

## 2019-11-12 NOTE — Progress Notes (Signed)
Speech Language Pathology Daily Session Note  Patient Details  Name: Jeff [name] MRN: [id number] Date of Birth: [birth year]  Today's Date: 11/12/2019 SLP Individual Time: 6945-0388 SLP Individual Time Calculation (min): 55 min  Short Term Goals: Week 1: SLP Short Term Goal 1 (Week 1): STG=LTG due to ELOS  Skilled Therapeutic Interventions: Skilled ST services focused on cognitive skills. SLP facilitated medication management utilizing familiar ALFA, however due to pt expressing blurred vision pt demonstrated increase difficulty reading medication label and using QID organizer.  SLP educated pt on personal scheduled medication and the importance of consistent medication management. Pt demonstrated recall of medication name/function/times per days with mod A verbal cues. Pt intermittently agreed to assistance with medication management due to vision, problem solving and memory deficits. SLP also facilitated basic problem solving and sustained attention skills in 3 step card sequence task, pt required min A fade to supervision A verbal cues and sustained attention in 10 minute interval. Pt was left in room with call bell within reach and bed alarm set. ST recommends to continue skilled ST services.      Pain Pain Assessment Pain Scale: 0-10 Pain Score: 0-No pain  Therapy/Group: Individual Therapy  Jeff [name]  [facility] 11/12/2019, 9:42 AM

## 2019-11-12 NOTE — NC FL2 (Signed)
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 Chronic combined systolic and diastolic congestive heart failure ([place])   . Hyponatremia   . 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 Hypotension   . Acute ischemic stroke ([place]) 10/26/2019  . Cocaine abuse ([place]) 10/26/2019  . CKD (chronic kidney disease) stage 3, GFR 30-59 ml/min 10/26/2019  . Acute metabolic encephalopathy 72/12/4707  . Prostate cancer metastatic to bone ([place]) 10/26/2019  . CVA (cerebral vascular accident) ([place]) 10/25/2019  . Port or reservoir infection   . B12 deficiency 04/01/2016  . Malnutrition of moderate degree ([place]) 05/24/2014  . Bacteremia 05/24/2014  . Pyrexia   . Sepsis ([place]) 05/23/2014  . UTI (lower urinary tract infection) 05/23/2014  . Health care maintenance 02/06/2014  . Homelessness 01/31/2014  . Malignant neoplasm of prostate ([place]) 01/30/2014  . Bone lesion 01/26/2014  . Acute renal failure ([place]) 01/26/2014  . Bilateral hydronephrosis 01/26/2014  . Anemia of chronic disease 01/26/2014  . History of cocaine abuse ([place]) 01/26/2014    Orientation RESPIRATION BLADDER Height & Weight     Self, Time, Situation, Place  Normal Incontinent Weight: 154 lb 12.2 oz (70.2 kg) Height:  6' (182.9 cm)  BEHAVIORAL SYMPTOMS/MOOD NEUROLOGICAL BOWEL NUTRITION STATUS       Incontinent Diet  AMBULATORY STATUS COMMUNICATION OF NEEDS Skin   Supervision Verbally Normal                       Personal Care Assistance Level of Assistance  Bathing, Dressing Bathing Assistance: Limited assistance Feeding assistance: Independent Dressing Assistance: Limited assistance     Functional Limitations Info    Sight Info: Adequate Hearing Info: Adequate Speech Info: Adequate    SPECIAL CARE FACTORS FREQUENCY  PT (By licensed PT), OT (By licensed OT), Speech therapy     PT Frequency: 5x a week OT Frequency: 5x a week     Speech Therapy Frequency: 5x a week      Contractures      Additional Factors Info    Code Status Info: full             Current Medications (11/12/2019):  This is the current hospital active medication list Current Facility-Administered Medications  Medication Dose Route Frequency Provider Last Rate Last Admin  . acetaminophen (TYLENOL) tablet 650 mg  650 mg Oral Q6H PRN Bary Leriche, PA-C   650 mg at 11/10/19 2009  . aspirin EC tablet 81 mg  81 mg Oral Daily Cathlyn Parsons, PA-C   81 mg at 11/12/19 0817  . atorvastatin (LIPITOR) tablet 40 mg  40 mg Oral Daily Cathlyn Parsons, PA-C   40 mg at 11/12/19 0817  . bisacodyl (DULCOLAX) suppository 10 mg  10 mg Rectal Daily PRN Bary Leriche, PA-C      .  Chlorhexidine Gluconate Cloth 2 % PADS 6 each  6 each Topical BID Kirsteins, Luanna Salk, MD   6 each at 11/12/19 [id number]  . enoxaparin (LOVENOX) injection 40 mg  40 mg Subcutaneous QHS Cathlyn Parsons, PA-C   40 mg at 11/11/19 2044  . famotidine (PEPCID) tablet 10 mg  10 mg Oral Daily PRN Love, Pamela S, PA-C      . heparin lock flush 100 unit/mL  500 Units Intracatheter Q30 days Charlett Blake, MD   500 Units at 11/03/19 1903   And  . heparin lock flush 100 unit/mL  500 Units Intracatheter PRN Kirsteins, Luanna Salk, MD      . midodrine (PROAMATINE) tablet 10 mg  10 mg Oral TID WC LoveIvan Anchors, PA-C   10 mg at 11/12/19 0940   . Muscle Rub CREA   Topical BID PRN Levada Dy, Dwayne A, RPH      . polyethylene glycol (MIRALAX / GLYCOLAX) packet 17 g  17 g Oral Daily PRN Bary Leriche, PA-C   17 g at 11/09/19 1149  . simethicone (MYLICON) chewable tablet 80 mg  80 mg Oral QID PRN Love, Pamela S, PA-C      . sodium chloride flush (NS) 0.9 % injection 10-40 mL  10-40 mL Intracatheter PRN Kirsteins, Luanna Salk, MD   10 mL at 11/03/19 1902  . sodium chloride flush (NS) 0.9 % injection 10-40 mL  10-40 mL Intracatheter Q12H Kirsteins, Luanna Salk, MD   10 mL at 11/12/19 0817  . sodium chloride flush (NS) 0.9 % injection 10-40 mL  10-40 mL Intracatheter PRN Kirsteins, Luanna Salk, MD      . sodium phosphate (FLEET) 7-19 GM/118ML enema 1 enema  1 enema Rectal Daily PRN Love, Pamela S, PA-C       Facility-Administered Medications Ordered in Other Encounters  Medication Dose Route Frequency Provider Last Rate Last Admin  . cyanocobalamin ((VITAMIN B-12)) injection 1,000 mcg  1,000 mcg Intramuscular Once Baird Cancer, PA-C         Discharge Medications: Please see discharge summary for a list of discharge medications.  Relevant Imaging Results:  Relevant Lab Results:   Additional Information    Dyanne Iha

## 2019-11-12 NOTE — Progress Notes (Signed)
[place] PHYSICAL MEDICINE & REHABILITATION PROGRESS NOTE  Subjective/Complaints:  pt comfortable. No complaints. Asked me for the third straight day what his dc date was going to be.  ROS: Patient denies fever, rash, sore throat, blurred vision, nausea, vomiting, diarrhea, cough, shortness of breath or chest pain, joint or back pain, headache, or mood change.     Objective: Vital Signs: Blood pressure (!) 84/66, pulse 61, temperature 98.3 F (36.8 C), temperature source Oral, resp. rate 16, height 6' (1.829 m), weight 70.2 kg, SpO2 95 %. No results found. Recent Labs    11/12/19 0704  WBC 8.4  HGB 8.8*  HCT 28.2*  PLT 441*   Recent Labs    11/10/19 0425  CREATININE 1.49*    Physical Exam: BP (!) 84/66 (BP Location: Right Arm)   Pulse 61   Temp 98.3 F (36.8 C) (Oral)   Resp 16   Ht 6' (1.829 m)   Wt 70.2 kg   SpO2 95%   BMI 20.99 kg/m   Constitutional: No distress . Vital signs reviewed. HEENT: EOMI, oral membranes moist Neck: supple Cardiovascular: RRR without murmur. No JVD    Respiratory/Chest: CTA Bilaterally without wheezes or rales. Normal effort    GI/Abdomen: BS +, non-tender, non-distended Ext: no clubbing, cyanosis, or edema Psych: pleasant and cooperative Dysarthric but intelligible Mild left inattention ongoing  He is able to follow simple one step commands. Fair awareness, STM deficits Motor: RUE: 4+/5 proximal distal, unchanged RLE: 4+/5 proximal distal LUE/LLE: 5/5 proximal distal    Assessment/Plan: 1. Functional deficits secondary to acute left caudate and putamen infarct which require 3+ hours per day of interdisciplinary therapy in a comprehensive inpatient rehab setting.  Physiatrist is providing close team supervision and 24 hour management of active medical problems listed below.  Physiatrist and rehab team continue to assess barriers to discharge/monitor patient progress toward functional and medical goals  Care  Tool:  Bathing    Body parts bathed by patient: Right arm, Left arm, Chest, Abdomen, Front perineal area, Right upper leg, Left upper leg, Face, Left lower leg, Right lower leg, Buttocks   Body parts bathed by helper: Right lower leg, Left lower leg, Buttocks     Bathing assist Assist Level: Supervision/Verbal cueing     Upper Body Dressing/Undressing Upper body dressing   What is the patient wearing?: Pull over shirt    Upper body assist Assist Level: Set up assist    Lower Body Dressing/Undressing Lower body dressing      What is the patient wearing?: Pants, Incontinence brief     Lower body assist Assist for lower body dressing: Minimal Assistance - Patient > 75%     Toileting Toileting    Toileting assist Assist for toileting: Supervision/Verbal cueing     Transfers Chair/bed transfer  Transfers assist     Chair/bed transfer assist level: Minimal Assistance - Patient > 75%     Locomotion Ambulation   Ambulation assist      Assist level: Contact Guard/Touching assist Assistive device: Walker-rolling Max distance: 150   Walk 10 feet activity   Assist     Assist level: Contact Guard/Touching assist Assistive device: Walker-rolling   Walk 50 feet activity   Assist Walk 50 feet with 2 turns activity did not occur: Safety/medical concerns (decreased activity tolerance)  Assist level: Contact Guard/Touching assist Assistive device: Walker-rolling    Walk 150 feet activity   Assist Walk 150 feet activity did not occur: Safety/medical concerns  Assist level:  Contact Guard/Touching assist Assistive device: Walker-rolling    Walk 10 feet on uneven surface  activity   Assist Walk 10 feet on uneven surfaces activity did not occur: Safety/medical concerns (decreased balance/activity tolerance)         Wheelchair     Assist Will patient use wheelchair at discharge?: No (No long term goals set by PT) Type of Wheelchair: Manual     Wheelchair assist level: Supervision/Verbal cueing Max wheelchair distance: 81 ft    Wheelchair 50 feet with 2 turns activity    Assist        Assist Level: Supervision/Verbal cueing   Wheelchair 150 feet activity     Assist     Assist Level: Moderate Assistance - Patient 50 - 74%      Medical Problem List and Plan: 1.  Dysarthria, right hemiparesis with numbness, right inattention and impulsivity secondary to acute left caudate and putamen infarct with involvement in insular, fronto-parietal cortical and subcortical brain and mild swelling.   Continue CIR. Looks like family cannot provide assist---may need SNF    -decision perhaps on Monday 2.  Antithrombotics: -DVT/anticoagulation  Pharmaceutical: Lovenox             -antiplatelet therapy: Low dose ASA 3. Pain Management: Well controlled.  4. Mood: LCSW to follow for evaluation and support.              -antipsychotic agents: N/a 5. Neuropsych: This patient is ?fully capable of making decisions on his own behalf. 6. Skin/Wound Care: Routine pressure relief measures.  7. Fluids/Electrolytes/Nutrition: Monitor I/Os.  CMP ordered. 8. Hypotension: Continue to monitor BP   ProAmatine increased to 10 mg tid on 7/11  Symptomatic orthostasis with near syncopal episode on 7/11, requiring patient to return to bed.   7/13: Asymptomatic othostasis today- better controlled with thigh high TEDs. - no repeat orthostatic vitals in flowsheet              7/16-18 still orthostatic, bp's soft in supine as well--pt denied any dizziness or other symptoms with therapy yesterday   -continue TEDs, binder 9. 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 Delton Coombes. Last infusion  06/24 and 7/15.    -compazine prn for nausea Will need OP f/u , family asking whether pt would qualify for hospice, will get Onc input 10. CKD IIIb: Baseline SCr 1.7-1.8 range per records. Will continue to  monitor.              Creatinine 1.47 on 7/10, 1.65 on 7/12, 1.49 on 7/17. - at baseline cont to monitor weekly  11. Polysubstance abuse: Continue to counsel on cessation.  12. Anemia of chronic disease:  Will continue to monitor.              Hemoglobin 9.6 on 7/11, 8.8 on 7/12 13.  Hyponatremia  Sodium 133 on 7/10, 131 on 7/12  May be contributing to #8  Twice daily salt tabs ordered  7/13: Na is 134 14.  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 72kg 7/16--back to 71kg today  7/18 +2279cc since admit---if further trend up, consider low dose diuretic    -doesn't appear fluid overloaded however    -check BMET and weight again tomorrow  LOS: 9 days A FACE TO FACE EVALUATION WAS PERFORMED  Charlett Blake 11/12/2019, 10:46 AM

## 2019-11-12 NOTE — Progress Notes (Signed)
Patient ID: Jeff [name], male   DOB: 01-17-1946, 74 y.o.   MRN: [id number]  Sw followed up with patient to see if there was any questions or concerns. Patient wanting to know what day he was discharging, sw provided him with that information. Patient requesting to discharging, sw informed patient she would speak with his friend [name]). Patient has no other concerns

## 2019-11-12 NOTE — Progress Notes (Signed)
Occupational Therapy Session Note  Patient Details  Name: Jeff [name] MRN: [id number] Date of Birth: [birth year]  Today's Date: 11/12/2019 OT Individual Time: 1000-1055 OT Individual Time Calculation (min): 55 min    Short Term Goals: Week 1:  OT Short Term Goal 1 (Week 1): STG=LTG  Skilled Therapeutic Interventions/Progress Updates:    Pt resting in bed upon arrival. OT intervention with focus on bed mobility, sitting balance, sit<>stand, standing balance, functional transfers, BADL retraining, and safety awareness to increase independence with BADLs. Therapist assisted with donning Ted hose prior to sitting EOB. Once seated EOB, abdominal binder donned in sitting. BP supine with Ted hose-90/63 HR 53. Seated with binder-78/47 HR 62 and asymptomatic. Pt continued with bathing/dressing with sit<>stand from EOB. See Care Tool for assist levels. Pt required CGA for standing balance to pull pants over hips and facilitate placement of brief.  Pt incontinent of bladder. BP after dressing 96/67 HR 86. Pt transferred to recliner and remained seated. Belt alarm activated. All needs within reach.   Therapy Documentation Precautions:  Precautions Precautions: Fall Precaution Comments: R weakness UE>LE, R inattention, posterior lean in standing, low BP Restrictions Weight Bearing Restrictions: No  Pain: Pain Assessment Pain Scale: 0-10 Pain Score: 0-No pain   Therapy/Group: Individual Therapy  Leroy Libman 11/12/2019, 10:56 AM

## 2019-11-12 NOTE — Progress Notes (Signed)
Patient ID: Jeff [name], male   DOB: Oct 17, 1945, 74 y.o.   MRN: [id number]   Patient declined by [place] [facility], no reason listed. SW will follow up

## 2019-11-12 NOTE — Progress Notes (Signed)
Physical Therapy Session Note  Patient Details  Name: Jeff [name] MRN: [id number] Date of Birth: 07-25-1945  Today's Date: 11/12/2019 PT Individual Time: 1302-1330 PT Individual Time Calculation (min): 28 min   Short Term Goals: Week 2:  PT Short Term Goal 1 (Week 2): STG=LTG due to ELOS  Skilled Therapeutic Interventions/Progress Updates:     Patient in recliner with LEs dependent, B thigh high TED hose and abdominal binder donned upon PT arrival. Patient alert and agreeable to PT session. Patient denied pain during session.  Focussed session on orthostatic assessment and intervention with mobility.   Orthostatic Vitals: Sitting: BP 97/64, HR 70 Standing: BP 79/63, HR 79 2-6" ACE wraps applied with total A Sitting: BP 83/67, HR 76 Standing: BP 100/64, HR 117 After ambulating 132 ft: BP 99/71, HR 97  Patient was asymptomatic throughout session.   Therapeutic Activity: Transfers: Patient performed sit to/from stand x2 and maintained standing balance ~1 min and 3 min with close supervision using RW. Provided verbal cues for hand position on RW and forward weight shift in standing due to slight posterior lean.  Gait Training:  Patient ambulated 132 feet using RW with close supervision for safety. Ambulated with decreased gait speed, decreased step length and height, increased B hip and knee flexion in stance, narrow [name], forward trunk lean, and downward head gaze. Provided verbal cues for erect posture, staying close to the RW, and increased step height and [name] for safety.  Educated patient on alternating lying and sitting and elevating LEs when seated in the recliner to improve sitting BP. Educated patient on purpose of TEDs, ACE wraps, and abdominal binder and instructed him to have them removed in the evening and donned when getting OOB in the morning. Patient with questions about home management of OH. Discussed use of current interventions as well as possible changes in  medication by the medical team. Encouraged patient to discuss with MD in the morning, patient in agreement.   Patient in recliner with LEs elevated at end of session with breaks locked, chair alarm set, and all needs within reach.    Therapy Documentation Precautions:  Precautions Precautions: Fall Precaution Comments: R weakness UE>LE, R inattention, posterior lean in standing, low BP Restrictions Weight Bearing Restrictions: No    Therapy/Group: Individual Therapy  Aira Sallade L Sheza Strickland PT, DPT  11/12/2019, 6:00 PM

## 2019-11-12 NOTE — Progress Notes (Signed)
Physical Therapy Session Note  Patient Details  Name: Jeff [name] MRN: [id number] Date of Birth: 1945/11/30  Today's Date: 11/12/2019 PT Individual Time: 2482-5003 PT Individual Time Calculation (min): 40 min   Short Term Goals: Week 1:  PT Short Term Goal 1 (Week 1): Patient will perform transfer with CGA. PT Short Term Goal 1 - Progress (Week 1): Met PT Short Term Goal 2 (Week 1): Patient will perform dynamic standing balance with CGA >10 min. PT Short Term Goal 2 - Progress (Week 1): Met PT Short Term Goal 3 (Week 1): Patient will ambulate >100 feet with CGA using LRAD. PT Short Term Goal 3 - Progress (Week 1): Discontinued (comment) Week 2:  PT Short Term Goal 1 (Week 2): STG=LTG due to ELOS  Skilled Therapeutic Interventions/Progress Updates:    BP assessed and monitored throughout session (see below for details. Pt already with ACE wraps, tedhose, and abdominal binder donned. Pt denies any reports of dizziness or lightheadedness throughout session.  Focused on functional gait training on unit with RW with focus on safety and balance with close supervision to occasional CGA during turns or during distracting environment.   Administered TUG for fall risk assessment = 29 sec (average of 3 trials with RW) indicating fall risk.  Practiced floor transfer and educated on fall recovery and overall safety/fall risk. Pt able to perform transfer with close supervision. Pt reports that using a walker may "get in the way" but agreed that he understands our recommendation is to use it at all times at home.  Dynamic gait without AD to increase challenge of balance going forwards, backwards and lateral sidestepping with overall min assist x 15' each direction x 2 reps.  Pt denies concerns in regards to d/c or home environment/set-up. He states he is ready. Discussed overall safety recommendations and increased fall risk especially if periods of time if alone (recommending having cell phone on  his person at all times in case of emergency). Verbalized understanding.   End of session left up in recliner with all needs in reach.   Therapy Documentation Precautions:  Precautions Precautions: Fall Precaution Comments: R weakness UE>LE, R inattention, posterior lean in standing, low BP Restrictions Weight Bearing Restrictions: No    Vital Signs: Therapy Vitals Temp: 98.4 F (36.9 C) Pulse Rate: 62 Resp: 18 BP: (!) 84/68 Patient Position (if appropriate): Sitting Oxygen Therapy SpO2: 98 % O2 Device: Room Air   70/56 mmHg; HR = 63 bpm standing; asymptomatic 110/68 mmHg; HR = 79 bpm after gait in sitting; asymptomatic 101/69 mmHg; HR = 75 bpm at end of session; asymtomatic  Pain:  Denies pain.    Therapy/Group: Individual Therapy  Canary Brim Ivory Broad, PT, DPT, CBIS  11/12/2019, 3:33 PM

## 2019-11-12 NOTE — Plan of Care (Signed)
  Problem: RH SAFETY Goal: RH STG ADHERE TO SAFETY PRECAUTIONS W/ASSISTANCE/DEVICE Description: STG Adhere to Safety Precautions With SUPERVISION Assistance/Device.  Outcome: Progressing

## 2019-11-13 ENCOUNTER — Inpatient Hospital Stay (HOSPITAL_COMMUNITY): Payer: Medicare Other | Admitting: Occupational Therapy

## 2019-11-13 ENCOUNTER — Inpatient Hospital Stay (HOSPITAL_COMMUNITY): Payer: Medicare Other | Admitting: Physical Therapy

## 2019-11-13 ENCOUNTER — Inpatient Hospital Stay (HOSPITAL_COMMUNITY): Payer: Medicare Other

## 2019-11-13 NOTE — Progress Notes (Addendum)
Speech Language Pathology Weekly Progress and Session Note  Patient Details  Name: Joao Mccurdy MRN: [id number] Date of Birth: 11-25-1945  Beginning of progress report period: November 06, 2019 End of progress report period: November 13, 2019   Short Term Goals: Week 1: SLP Short Term Goal 1 (Week 1): STG=LTG due to ELOS SLP Short Term Goal 1 - Progress (Week 1): Partly met    New Short Term Goals: Week 2: SLP Short Term Goal 1 (Week 2): Continue STG=LTG due to longer than expected ELOS  Weekly Progress Updates: Pt made slow progress this reporting period due to reduced awareness of deficits. SLP facilitated basic problem solving, memory strategies, and intellectual awareness skills in functional task such as money and medication management. Pt demonstrates intermittent intellectual awareness, but continues to demonstrate limited comprehension of deficits' impact on daily functional tasks. Pt demonstrated little carryover of speech intelligibility strategies, but is 90% intelligibility at phrase level with min a verbal cues. SLP downgraded intellectual awareness and speech intelligibility goals due to inconsistent carryover. Pt is awaiting SNF placement and SLP will continue to address LTG. SLP recommends 24 hour supervision, but he would likely be safe with intermittent supervision for short periods of time given the appropriate set up.     Intensity: Minumum of 1-2 x/day, 30 to 90 minutes Frequency: 3 to 5 out of 7 days Duration/Length of Stay: awaiting SNF Treatment/Interventions: Cognitive remediation/compensation;Cueing hierarchy;Internal/external aids;Speech/Language facilitation;Patient/family education;Functional tasks  General    Pain Pain Assessment Pain Score: 0-No pain  Therapy/Group: Individual Therapy  Juno Alers 11/13/2019, 12:12 PM

## 2019-11-13 NOTE — Progress Notes (Signed)
Patient ID: Jeff [name], male   DOB: 1945/09/05, 74 y.o.   MRN: [id number]   sw awaiting insurance approval or denial. Once decision made sw will move forward with confirming d/c date and scheduling COVID test.

## 2019-11-13 NOTE — Plan of Care (Signed)
°  Problem: RH Expression Communication Goal: LTG Patient will increase speech intelligibility (SLP) Description: LTG: Patient will increase speech intelligibility at word/phrase/conversation level with cues, % of the time (SLP) Flowsheets (Taken 11/13/2019 1142) LTG: Patient will increase speech intelligibility (SLP): (downgraded due to limited carryover and awareness) Minimal Assistance - Patient > 75% Note: Downgraded due to limited carryover and awareness   Problem: RH Awareness Goal: LTG: Patient will demonstrate awareness during functional activites type of (SLP) Description: LTG: Patient will demonstrate awareness during functional activites type of (SLP) Flowsheets (Taken 11/13/2019 1142) LTG: Patient will demonstrate awareness during cognitive/linguistic activities with assistance of (SLP): (downgraded due to fluctuating awareness) Moderate Assistance - Patient 50 - 74% Note: Downgraded due to fluctuating awareness

## 2019-11-13 NOTE — Progress Notes (Signed)
Physical Therapy Session Note  Patient Details  Name: Jeff [name] MRN: [id number] Date of Birth: [birth year]  Today's Date: 11/13/2019 PT Individual Time: 1300-1345 PT Individual Time Calculation (min): 45 min   Short Term Goals: Week 1:  PT Short Term Goal 1 (Week 1): Patient will perform transfer with CGA. PT Short Term Goal 1 - Progress (Week 1): Met PT Short Term Goal 2 (Week 1): Patient will perform dynamic standing balance with CGA >10 min. PT Short Term Goal 2 - Progress (Week 1): Met PT Short Term Goal 3 (Week 1): Patient will ambulate >100 feet with CGA using LRAD. PT Short Term Goal 3 - Progress (Week 1): Discontinued (comment) Week 2:  PT Short Term Goal 1 (Week 2): STG=LTG due to ELOS     Skilled Therapeutic Interventions/Progress Updates:    pt received in chair and agreeable to therapy. Pt directed in transfer to [facility] at supervision and taken to gym in [facility] for time management. Pt directed in car transfer at supervision to and from [facility] with minimal VC. Pt directed in gait training on ramp ascending and descending at [place] with pt demonstrating decreased cadence however no LOB or excessive sway. Pt directed in 2x10 standing cone toe taps with Rolling walker atCGA and VC for technique given. Pt directed in standing on airex pad for 2x15mns at dynavision without UE support for improved dynamic standing balance with directional changes and trunk rotations; grossly CGA. Pt directed in obstacle course with x3 cone weaving, stepping over 6" object height, and picking up an object from floor with use of Rolling walker for each [facility] directed in gait training with Rolling walker for 200' CGA-supervision with VC for trunk extension and safety awareness very minimally. Pt directed in bed mobility of sit<>supine at mod I to complete each. Gait training without AD at end of session for [facility] for excessive sway noted with VC for inc BOS once needed.  Pt left in recliner, alarm  set, All needs in reach and in good condition. Call light in hand.     Therapy Documentation Precautions:  Precautions Precautions: Fall Precaution Comments: R weakness UE>LE, R inattention, posterior lean in standing, low BP Restrictions Weight Bearing Restrictions: No   Vital Signs: Therapy Vitals Temp: 98.1 F (36.7 C) Pulse Rate: 74 Resp: 19 BP: (!) 82/63 Oxygen Therapy SpO2: 100 % Pain: Pain Assessment Pain Score: 0-No pain    Therapy/Group: Individual Therapy  HJunie Panning7/20/2021, 3:19 PM

## 2019-11-13 NOTE — Progress Notes (Signed)
Occupational Therapy Session Note  Patient Details  Name: Jeff [name] MRN: [id number] Date of Birth: [birth year]  Today's Date: 11/13/2019 OT Individual Time: 0703-0800 OT Individual Time Calculation (min): 57 min    Short Term Goals: Week 1:  OT Short Term Goal 1 (Week 1): STG=LTG  Skilled Therapeutic Interventions/Progress Updates:    Upon entering the room, pt in bed finishing breakfast. Pt with no c/o pain and agreeable to OT intervention. While pt eating, OT provided assist to don B thigh high TED hose and ACE wraps. Pt performed supine >sit with supervision and donned abdominal binder, pants, and B socks from EOB with supervision. Pt verbalizing he did not need to use bathroom but OT insisted pt attempt. Pt standing and ambulating with RW into bathroom with supervision overall. Pt needing min cuing and increased time to push down brief before sitting on commode. Pt began urinating before sitting demonstrating him being unaware of urgency with toileting needs when asked. Pt sitting for several minutes but unable to void further. Pt performed clothing management with supervision and returning to stand at sink for hand hygiene and to wash face. Pt reports being asymptomatic and BP checked once seated in recliner chair with results of 104/75. Pt engaged in R hand coordination and strengthening exercises with use of red resistive theraputty and paper HEP handout. Pt needing min cuing for proper technique of exercises. Pt remained in chair at end of session with chair alarm belt donned and call bell within reach.   Therapy Documentation Precautions:  Precautions Precautions: Fall Precaution Comments: R weakness UE>LE, R inattention, posterior lean in standing, low BP Restrictions Weight Bearing Restrictions: No General:   Vital Signs: Therapy Vitals Temp: 99.3 F (37.4 C) Temp Source: Oral Pulse Rate: 74 Resp: 17 BP: 91/66 Patient Position (if appropriate): Lying Oxygen  Therapy SpO2: 96 % ADL: ADL Eating: Set up Where Assessed-Eating: Chair Grooming: Minimal assistance Where Assessed-Grooming: Sitting at sink Upper Body Bathing: Minimal assistance Where Assessed-Upper Body Bathing: Sitting at sink Lower Body Bathing: Moderate assistance Where Assessed-Lower Body Bathing: Sitting at sink Upper Body Dressing: Minimal assistance Where Assessed-Upper Body Dressing: Standing at sink Lower Body Dressing: Moderate assistance Where Assessed-Lower Body Dressing: Sitting at sink, Standing at sink Toileting: Minimal assistance Where Assessed-Toileting: Bedside Commode   Therapy/Group: Individual Therapy  Gypsy Decant 11/13/2019, 8:48 AM

## 2019-11-13 NOTE — Progress Notes (Signed)
Speech Language Pathology Discharge Summary  Patient Details  Name: Jeff [name] MRN: [id number] Date of Birth: July 01, 1945   Patient has met 4 of 4 long term goals.  Patient to discharge at overall Mod;Min level.  Reasons goals not met:     Clinical Impression/Discharge Summary:    Pt made slow progress meeting 4 out 4 goals, discharging at mod-min A, due to primary impairment of intellectual awareness. Pt scored 9 out 30 (n=>20 reflecting education) indicating deficits in memory, problem solving and intellectual awareness. SLP facilitated basic problem solving, memory strategies, and intellectual awareness skills in functional task such as money and medication management. Pt demonstrates intermittent intellectual awareness, but continues to demonstrate limited comprehension of deficits' impact on daily functional tasks. Pt intermittently confirmed baseline deficits in problem solving and memory. Pt demonstrated little carryover of speech intelligibility strategies, but is 90% intelligibility at phrase level with min a verbal cues. SLP downgraded intellectual awareness and speech intelligibility goals due to inconsistent carryover. SLP recommends 24 hour supervision, but he would likely be safe with intermittent supervision for short periods of time given the appropriate set up. Pt benefited from skilled St services to maximize functional independence and reduce burden of care, requiring supervision and continued ST services.  Care Partner:  Caregiver Able to Provide Assistance: Yes  Type of Caregiver Assistance: Physical;Cognitive  Recommendation:  Home Health SLP;24 hour supervision/assistance;Skilled Nursing facility  Rationale for SLP Follow Up: Maximize functional communication;Maximize cognitive function and independence;Reduce caregiver burden   Equipment: N/A   Reasons for discharge: Discharged from hospital   Patient/Family Agrees with Progress Made and Goals Achieved: Yes     [name]  [facility] 11/13/2019, 12:24 PM

## 2019-11-13 NOTE — Progress Notes (Signed)
[place] PHYSICAL MEDICINE & REHABILITATION PROGRESS NOTE  Subjective/Complaints:  Discussed SNF vs home with pt's former employer who helps manage pt's affairs,pt without family except for son who is reportedly not able/willing to assist and is disabled as well.  no issues overnite.  ROS: Patient denies CP, SOB, N/V/D     Objective: Vital Signs: Blood pressure 91/66, pulse 74, temperature 99.3 F (37.4 C), temperature source Oral, resp. rate 17, height 6' (1.829 m), weight 70.2 kg, SpO2 96 %. No results found. Recent Labs    11/12/19 0704  WBC 8.4  HGB 8.8*  HCT 28.2*  PLT 441*   No results for input(s): NA, K, CL, CO2, GLUCOSE, BUN, CREATININE, CALCIUM in the last 72 hours.  Physical Exam: BP 91/66   Pulse 74   Temp 99.3 F (37.4 C) (Oral)   Resp 17   Ht 6' (1.829 m)   Wt 70.2 kg   SpO2 96%   BMI 20.99 kg/m    General: No acute distress Mood and affect are appropriate Heart: Regular rate and rhythm no rubs murmurs or extra sounds Lungs: Clear to auscultation, breathing unlabored, no rales or wheezes Abdomen: Positive bowel sounds, soft nontender to palpation, nondistended Extremities: No clubbing, cyanosis, or edema Skin: No evidence of breakdown, no evidence of rash  Dysarthric but intelligible Mild left inattention ongoing  He is able to follow simple one step commands. Fair awareness, STM deficits Motor: RUE: 4+/5 proximal distal, unchanged RLE: 4+/5 proximal distal LUE/LLE: 5/5 proximal distal    Assessment/Plan: 1. Functional deficits secondary to acute left caudate and putamen infarct which require 3+ hours per day of interdisciplinary therapy in a comprehensive inpatient rehab setting.  Physiatrist is providing close team supervision and 24 hour management of active medical problems listed below.  Physiatrist and rehab team continue to assess barriers to discharge/monitor patient progress toward functional and medical goals  Care  Tool:  Bathing    Body parts bathed by patient: Right arm, Left arm, Chest, Abdomen, Front perineal area, Right upper leg, Left upper leg, Face, Left lower leg, Right lower leg, Buttocks   Body parts bathed by helper: Right lower leg, Left lower leg, Buttocks     Bathing assist Assist Level: Supervision/Verbal cueing     Upper Body Dressing/Undressing Upper body dressing   What is the patient wearing?: Pull over shirt    Upper body assist Assist Level: Set up assist    Lower Body Dressing/Undressing Lower body dressing      What is the patient wearing?: Pants, Incontinence brief     Lower body assist Assist for lower body dressing: Minimal Assistance - Patient > 75%     Toileting Toileting    Toileting assist Assist for toileting: Supervision/Verbal cueing     Transfers Chair/bed transfer  Transfers assist     Chair/bed transfer assist level: Contact Guard/Touching assist     Locomotion Ambulation   Ambulation assist      Assist level: Contact Guard/Touching assist Assistive device: Walker-rolling Max distance: 150   Walk 10 feet activity   Assist     Assist level: Supervision/Verbal cueing Assistive device: Walker-rolling   Walk 50 feet activity   Assist Walk 50 feet with 2 turns activity did not occur: Safety/medical concerns (decreased activity tolerance)  Assist level: Supervision/Verbal cueing Assistive device: Walker-rolling    Walk 150 feet activity   Assist Walk 150 feet activity did not occur: Safety/medical concerns  Assist level: Contact Guard/Touching assist Assistive device: Walker-rolling  Walk 10 feet on uneven surface  activity   Assist Walk 10 feet on uneven surfaces activity did not occur: Safety/medical concerns (decreased balance/activity tolerance)         Wheelchair     Assist Will patient use wheelchair at discharge?: No (No long term goals set by PT) Type of Wheelchair: Manual    Wheelchair  assist level: Supervision/Verbal cueing Max wheelchair distance: 81 ft    Wheelchair 50 feet with 2 turns activity    Assist        Assist Level: Supervision/Verbal cueing   Wheelchair 150 feet activity     Assist     Assist Level: Moderate Assistance - Patient 50 - 74%      Medical Problem List and Plan: 1.  Dysarthria, right hemiparesis with numbness, right inattention and impulsivity secondary to acute left caudate and putamen infarct with involvement in insular, fronto-parietal cortical and subcortical brain and mild swelling.   Continue CIR. Looks like family cannot provide assist---would rec SNF- pt not infavor of this also discussed cessation of ETOH and drug use     -team conf in am  2.  Antithrombotics: -DVT/anticoagulation  Pharmaceutical: Lovenox             -antiplatelet therapy: Low dose ASA 3. Pain Management: Well controlled.  4. Mood: LCSW to follow for evaluation and support.              -antipsychotic agents: N/a 5. Neuropsych: This patient is ?fully capable of making decisions on his own behalf. 6. Skin/Wound Care: Routine pressure relief measures.  7. Fluids/Electrolytes/Nutrition: Monitor I/Os.  CMP ordered. 8. Hypotension: Continue to monitor BP   ProAmatine increased to 10 mg tid on 7/11   Vitals:   11/12/19 1939 11/13/19 0503  BP: 110/65 91/66  Pulse: (!) 54 74  Resp: 17 17  Temp: 99.5 F (37.5 C) 99.3 F (37.4 C)  SpO2: 99% 96%  BPs soft but pt refused orthostatic vitals    -continue TEDs, binder 9. 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 Delton Coombes. Last infusion  06/24 and 7/15.    -compazine prn for nausea Pt does not qualify for hospice from an onc standpoint and CVA was relatively mild  10. CKD IIIb: Baseline SCr 1.7-1.8 range per records. Will continue to monitor.              Creatinine 1.47 on 7/10, 1.65 on 7/12, 1.49 on 7/17. - at baseline cont to monitor weekly   11. Polysubstance abuse: Continue to counsel on cessation.  12. Anemia of chronic disease:  Will continue to monitor.              Hemoglobin 9.6 on 7/11, 8.8 on 7/12 13.  Hyponatremia  Sodium 133 on 7/10, 131 on 7/12, 8.8 on 7/19  May be contributing to #8  Twice daily salt tabs ordered  7/13: Na is 134 14.  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 72kg 7/16--back to 71kg today But within 1kg range    LOS: 10 days A FACE TO FACE EVALUATION WAS PERFORMED  Charlett Blake 11/13/2019, 9:05 AM

## 2019-11-13 NOTE — Progress Notes (Signed)
Pt refused to allow nt to obtain orthostatic vs. Attempted twice. Pt did not feel like sitting and standing.

## 2019-11-13 NOTE — Progress Notes (Signed)
Physical Therapy Session Note  Patient Details  Name: Jeff [name] MRN: [id number] Date of Birth: July 24, 1945  Today's Date: 11/13/2019 PT Individual Time: 1510-1536 PT Individual Time Calculation (min): 26 min   Short Term Goals: Week 2:  PT Short Term Goal 1 (Week 2): STG=LTG due to ELOS  Skilled Therapeutic Interventions/Progress Updates: Pt presented in recliner agreeable to therapy. Pt denies pain during session. Performed STS with supervision and use of RW, pt ambulated to day room with RW and close S. Participated in NuStep L3 x 8 min for cardiovascular endurance and global conditioning with pt maintaining avg 60 SPM. Pt then ambulated to high/low mat and participated in STS while holding red physioball lifting up to shoulder height. Pt also participated in tossing physioball bouncing against floor for dynamic balance. Pt then ambulated back to room at end of session and returned to recliner at supervision level. Pt left in recliner with belt alarm on, call bell within reach and needs met.       Therapy Documentation Precautions:  Precautions Precautions: Fall Precaution Comments: R weakness UE>LE, R inattention, posterior lean in standing, low BP Restrictions Weight Bearing Restrictions: No General:   Vital Signs: Therapy Vitals Temp: 98.1 F (36.7 C) Pulse Rate: 74 Resp: 19 BP: (!) 82/63 Oxygen Therapy SpO2: 100 % Pain:      Therapy/Group: Individual Therapy  Jacquelene Kopecky  Destiny Trickey, PTA  11/13/2019, 4:07 PM

## 2019-11-13 NOTE — Progress Notes (Signed)
Speech Language Pathology Daily Session Note  Patient Details  Name: Jeff [name] MRN: [id number] Date of Birth: Aug 29, 1945  Today's Date: 11/13/2019 SLP Individual Time: 1032-1100 SLP Individual Time Calculation (min): 28 min  Short Term Goals: Week 1: SLP Short Term Goal 1 (Week 1): STG=LTG due to ELOS  Skilled Therapeutic Interventions: Skilled ST services focused on cognitive skills. SLP facilitated continued assessment of cognitive linguistic skills administering SLUMS and to aid in intellectual awareness. Pt scored 9 out of 30 ( n=>20 reflecting education level) noting impairments in immediate/short term recall, problem solving, mental calculations, reversing numbers and divergent naming ( due to attention and recall verse language.) Pt demonstrated x1 error awareness during clock drawing task, but throughout the session required mod A verbal cues for intellectual awareness depsite extensive education when analyzing deficits reflected in assessment results. Pt demonstrated continued poor insight of deficits when given home safety scenarios. SLP downgraded speech intelligibility and awareness goals to reflect poor carryover. Pt was left in room with call bell within reach and chair alarm set. ST recommends to continue skilled ST services.      Pain Pain Assessment Pain Scale: 0-10 Pain Score: 0-No pain  Therapy/Group: Individual Therapy  Jeff [name]  [facility] 11/13/2019, 11:44 AM

## 2019-11-13 NOTE — Progress Notes (Signed)
Physical Therapy Session Note  Patient Details  Name: Jeff [name] MRN: [id number] Date of Birth: 1945/10/10  Today's Date: 11/13/2019 PT Individual Time: 1130-1158 PT Individual Time Calculation (min): 28 min   Short Term Goals: Week 1:  PT Short Term Goal 1 (Week 1): Patient will perform transfer with CGA. PT Short Term Goal 1 - Progress (Week 1): Met PT Short Term Goal 2 (Week 1): Patient will perform dynamic standing balance with CGA >10 min. PT Short Term Goal 2 - Progress (Week 1): Met PT Short Term Goal 3 (Week 1): Patient will ambulate >100 feet with CGA using LRAD. PT Short Term Goal 3 - Progress (Week 1): Discontinued (comment) Week 2:  PT Short Term Goal 1 (Week 2): STG=LTG due to ELOS  Skilled Therapeutic Interventions/Progress Updates:    Pt declines need to use bathroom at start and at end of session. Denies any symptoms of orthostasis during session (already had ACE wraps, tedhose, and abdominal binder). Functional transfers throughout session with close supervision to occasional CGA for balance without AD and cues at times for safety. Functional gait with RW on unit x 150' with close supervision overall even in distracting environment and obstacle negotiation. Stair negotiation for community mobility up/down 12 steps with bilateral rails with close supervision to CGA (on taller steps) for balance. NMR on compliant surface while performing functional reaching task without UE support to reach outside of BOS for horseshoes and then picking up items off of floor x 3 reps with CGA to min assist overall. Dynamic gait for NMR for balance re-training while performing obstacle negotiation and lateral sidestepping with CGA.   Therapy Documentation Precautions:  Precautions Precautions: Fall Precaution Comments: R weakness UE>LE, R inattention, posterior lean in standing, low BP Restrictions Weight Bearing Restrictions: No    Pain: Pain Assessment Pain Scale: 0-10 Pain Score:  0-No pain    Therapy/Group: Individual Therapy  Canary Brim Ivory Broad, PT, DPT, CBIS  11/13/2019, 12:01 PM

## 2019-11-13 NOTE — Plan of Care (Signed)
  Problem: RH BLADDER ELIMINATION Goal: RH STG MANAGE BLADDER WITH ASSISTANCE Description: STG Manage Bladder With Assistance MOD I Outcome: Progressing   Problem: RH SAFETY Goal: RH STG ADHERE TO SAFETY PRECAUTIONS W/ASSISTANCE/DEVICE Description: STG Adhere to Safety Precautions With SUPERVISION Assistance/Device.  Outcome: Progressing

## 2019-11-13 NOTE — Discharge Summary (Signed)
Physician Discharge Summary  Patient ID: Jeff [name] MRN: [id number] DOB/AGE: Nov 18, 1945 74 y.o.  Admit date: 11/03/2019 Discharge date: 11/20/2019  Discharge Diagnoses:  Principal Problem:   Acute ischemic stroke [facility]) Active Problems:   Anemia of chronic disease   B12 deficiency   CKD (chronic kidney disease) stage 3, GFR 30-59 ml/min   Chronic combined systolic and diastolic congestive heart failure [facility])   Discharged Condition: stable   Significant Diagnostic Studies: N/A  Filed Weights   11/18/19 0531 11/19/19 0510 11/20/19 0411  Weight: 71.1 kg 71.4 kg 71.2 kg    Labs:  Basic Metabolic Panel: BMP Latest Ref Rng & Units 11/20/2019 11/10/2019 11/06/2019  Glucose 70 - 99 mg/dL 87 - 95  BUN 8 - 23 mg/dL 29(H) - 19  Creatinine 0.61 - 1.24 mg/dL 1.46(H) 1.49(H) 1.61(H)  Sodium 135 - 145 mmol/L 136 - 134(L)  Potassium 3.5 - 5.1 mmol/L 4.3 - 4.2  Chloride 98 - 111 mmol/L 104 - 102  CO2 22 - 32 mmol/L 21(L) - 23  Calcium 8.9 - 10.3 mg/dL 9.3 - 8.0(L)    CBC: CBC Latest Ref Rng & Units 11/19/2019 11/12/2019 11/05/2019  WBC 4.0 - 10.5 K/uL 6.2 8.4 6.4  Hemoglobin 13.0 - 17.0 g/dL 9.8(L) 8.8(L) 8.8(L)  Hematocrit 39 - 52 % 30.7(L) 28.2(L) 26.7(L)  Platelets 150 - 400 K/uL 432(H) 441(H) 261    CBG: No results for input(s): GLUCAP in the last 168 hours.  Brief HPI:   Jeff [name] is a 74 y.o. male with history of metastatic prostate cancer, ETOH/cocaine abuse who was admitted on 10/25/19 with right facial droop, dysarthria, right hemiparesis and AMS. BP low at admission with SBP 90-110 and UDS positive for cocaine. MRI/MRA brain done revealing acute left caudate/putamen infarct, patchy areas of involvement and insula, frontoparietal cortical and subcortical brain.  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  2D echo showed moderately decreased function with EF of 35% with regional wall abnormality.  Stroke was felt to be embolic due to unknown source  and he was started on low-dose ASA.  Patient's blood pressures continue to be low and ProAmatine was increased to 5 mg 3 times daily with recommendations to add beta-blocker due to Cocaine abuse history.  Patient with resultant dysarthria, anarthria, right-sided weakness with numbness, right inattention and impulsivity.  CIR was recommended to functional deficits in mobility and ADLs.   Hospital Course: Jeff [name] was admitted to rehab 11/03/2019 for inpatient therapies to consist of PT, ST and OT at least three hours five days a week. Past admission physiatrist, therapy team and rehab RN have worked together to provide customized collaborative inpatient rehab. Blood pressures were monitored on TID basis and continued to be on the low side.  ProAmatine was titrated up to 10 TID in addition to TED and abdominal binder to help with BP support.  P.o. intake has been good and is continent of bowel and bladder.  Chronic hyponatremia has been monitored with serial checks and is relatively stable. Renal status has improved with SCr down to 1.46. Serial CBC shows anemia of chronic disease to be slowly improving.  Chronic combined CHF is compensated without signs of overload and weight is stable around 71 Kg.   He continues to require supervision due to right inattention and ataxia.  Due to lack of social support SNF was recommended for follow-up therapy.  He was discharged to [facility] on 46/65.      Rehab course: During  patient's stay in rehab weekly team conferences were held to monitor patient's progress, set goals and discuss barriers to discharge. At admission, patient required mod assist with mobility and basic self care tasks. He exhibited moderate cognitive deficits and required increased time with mod to max assist to complete functional tasks. He  has had improvement in activity tolerance, balance, postural control as well as ability to compensate for deficits. He has had improvement in functional  use RUE  and RLE as well as improvement in awareness.  He requires set up assist for upper body bathing and supervision for safety with dressing and lower body tasks. He requires supervision for transfers and to ambulate 150' with RW.  He requires max assist with mod cues for basic problem solving.   Disposition: Skilled nursing facility.  Diet: Heart healthy.  Special Instructions: 1.  Needs supervision with activity. 2.  Will need follow-up with cardiology for work-up of cardiomyopathy/CHF. 3. Monitor for signs of overload and check weights daily.    Discharge Instructions    Ambulatory referral to Cardiology   Complete by: As directed    Needs work up of CHF.   Ambulatory referral to Neurology   Complete by: As directed    An appointment is requested in approximately: 3-4 weeks   Ambulatory referral to Physical Medicine Rehab   Complete by: As directed    3-4 weeks follow up     Allergies as of 11/20/2019      Reactions   Chlorhexidine       Medication List    STOP taking these medications   calcium gluconate 500 MG tablet   prochlorperazine 10 MG tablet Commonly known as: COMPAZINE   senna-docusate 8.6-50 MG tablet Commonly known as: Senokot-S     TAKE these medications   aspirin 81 MG EC tablet Take 1 tablet (81 mg total) by mouth daily. Swallow whole.   atorvastatin 40 MG tablet Commonly known as: LIPITOR Take 1 tablet (40 mg total) by mouth daily.   famotidine 10 MG tablet Commonly known as: PEPCID Take 1 tablet (10 mg total) by mouth daily as needed for heartburn or indigestion.   midodrine 10 MG tablet Commonly known as: PROAMATINE Take 1 tablet (10 mg total) by mouth 3 (three) times daily with meals. What changed:   medication strength  how much to take   Muscle Rub 10-15 % Crea Apply 1 application topically 2 (two) times daily as needed for muscle pain. To back and hips   simethicone 80 MG chewable tablet Commonly known as: MYLICON Chew 1  tablet (80 mg total) by mouth 4 (four) times daily as needed for flatulence.       Contact information for follow-up providers    Kirsteins, Luanna Salk, MD Follow up.   Specialty: Physical Medicine and Rehabilitation Why: Office will call you with follow-up appointment. Contact information: [place] [address] [phone]        Derek Jack, MD. Call.   Specialty: Hematology Why: For follow-up appointment. Contact information: [place] [address] [phone]        [facility] Follow up.   Why: Office will call with follow-up appointment Contact information: [address]     [place] [address] [phone]           Contact information for after-discharge care    [facility] .   Service: Skilled  Nursing Contact information: [address] [place] [phone]                  Signed: Bary Leriche 11/20/2019, 10:53 AM

## 2019-11-14 ENCOUNTER — Inpatient Hospital Stay (HOSPITAL_COMMUNITY): Payer: Medicare Other | Admitting: Occupational Therapy

## 2019-11-14 ENCOUNTER — Inpatient Hospital Stay (HOSPITAL_COMMUNITY): Payer: Medicare Other | Admitting: Physical Therapy

## 2019-11-14 ENCOUNTER — Inpatient Hospital Stay (HOSPITAL_COMMUNITY): Payer: Medicare Other

## 2019-11-14 NOTE — Progress Notes (Signed)
Patient ID: Jeff [name], male   DOB: 07-Jan-1946, 74 y.o.   MRN: [id number]   Patient still pending review per [place], [place]
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
## 2019-11-14 NOTE — Progress Notes (Signed)
Physical Therapy Discharge Summary  Patient Details  Name: Jeff [name] MRN: [id number] Date of Birth: 1945-09-16  Today's Date: 11/14/2019   Patient has met 12 of 12 long term goals due to improved activity tolerance, improved balance, improved postural control, increased strength, improved attention, improved awareness and improved coordination.  Patient to discharge at an ambulatory level Supervision.   Patient's care partner is independent to provide the necessary physical assistance at discharge.  Reasons goals not met: N/A all goals met  Recommendation:  Patient will benefit from ongoing skilled PT services in skilled nursing facility setting to continue to advance safe functional mobility, address ongoing impairments in balance, safety, gait, transfers, community access, and minimize fall risk.  Equipment: No equipment provided  Reasons for discharge: treatment goals met  Patient/family agrees with progress made and goals achieved: Yes  PT Discharge Precautions/Restrictions Precautions Precautions: Fall Precaution Comments: R weakness UE>LE, R inattention, posterior lean in standing, low BP Vital Signs Therapy Vitals Temp: 98 F (36.7 C) Temp Source: Oral Pulse Rate: 75 Resp: 20 BP: 93/61 Patient Position (if appropriate): Sitting Oxygen Therapy SpO2: 100 % O2 Device: Room Air Pain Pain Assessment Pain Scale: 0-10 Pain Score: 0-No pain Vision/Perception     Cognition Arousal/Alertness: Awake/alert Sensation Sensation Light Touch: Appears Intact Proprioception: Appears Intact Motor  Motor Motor: Hemiplegia Motor - Discharge Observations: Mild impairment on R, improved from eval  Mobility Bed Mobility Bed Mobility: Sit to Supine;Rolling Right;Rolling Left;Supine to Sit Rolling Right: Independent Rolling Left: Independent Supine to Sit: Independent Sit to Supine: Independent Transfers Transfers: Sit to Stand;Stand Pivot Transfers;Stand to Sit Sit  to Stand: Supervision/Verbal cueing Stand to Sit: Supervision/Verbal cueing Stand Pivot Transfers: Supervision/Verbal cueing Locomotion  Gait Ambulation: Yes Gait Assistance: Supervision/Verbal cueing Gait Distance (Feet): 150 Feet Assistive device: Rolling walker Gait Gait: Yes Gait Pattern: Within Functional Limits Gait Pattern: Decreased stride length;Decreased trunk rotation;Narrow base of support Gait velocity: decreased Stairs / Additional Locomotion Stairs: Yes Stairs Assistance: Contact Guard/Touching assist;Moderate Assistance - Patient 50 - 74% (per care tool) Stair Management Technique: Two rails Number of Stairs: 12 Height of Stairs: 6 Wheelchair Mobility Wheelchair Mobility: No  Trunk/Postural Assessment  Cervical Assessment Cervical Assessment: Exceptions to [facility] (forward head) Thoracic Assessment Thoracic Assessment: Exceptions to [facility] (rounded shoulders) Lumbar Assessment Lumbar Assessment: Within Functional Limits Postural Control Postural Control: Deficits on evaluation  Balance Balance Balance Assessed: Yes Static Sitting Balance Static Sitting - Balance Support: No upper extremity supported;Feet supported Static Sitting - Level of Assistance: 7: Independent Dynamic Sitting Balance Dynamic Sitting - Balance Support: No upper extremity supported;Feet supported Dynamic Sitting - Level of Assistance: 7: Independent Static Standing Balance Static Standing - Balance Support: No upper extremity supported Static Standing - Level of Assistance: 5: Stand by assistance Dynamic Standing Balance Dynamic Standing - Balance Support: No upper extremity supported;During functional activity Dynamic Standing - Level of Assistance: 5: Stand by assistance Dynamic Standing - Balance Activities: [employer];Reaching for objects;Reaching across midline Dynamic Standing - Comments: picking up bojects from floor with use of RW Extremity Assessment      RLE Assessment RLE  Assessment: Exceptions to [facility] General Strength Comments: grossly 4+/5 LLE Assessment LLE Assessment: Within Functional Limits General Strength Comments: grossly 5/5    Rosita DeChalus 11/14/2019, 4:35 PM

## 2019-11-14 NOTE — Progress Notes (Signed)
[place] PHYSICAL MEDICINE & REHABILITATION PROGRESS NOTE  Subjective/Complaints:  No issues overnite , discussed potential SNF transfer and estimated stay at SNF   ROS: Patient denies CP, SOB, N/V/D     Objective: Vital Signs: Blood pressure 92/65, pulse (!) 52, temperature 97.6 F (36.4 C), resp. rate 17, height 6' (1.829 m), weight 70.2 kg, SpO2 99 %. No results found. Recent Labs    11/12/19 0704  WBC 8.4  HGB 8.8*  HCT 28.2*  PLT 441*   No results for input(s): NA, K, CL, CO2, GLUCOSE, BUN, CREATININE, CALCIUM in the last 72 hours.  Physical Exam: BP 92/65 (BP Location: Right Arm)   Pulse (!) 52   Temp 97.6 F (36.4 C)   Resp 17   Ht 6' (1.829 m)   Wt 70.2 kg   SpO2 99%   BMI 20.99 kg/m   General: No acute distress Mood and affect are appropriate Heart: Regular rate and rhythm no rubs murmurs or extra sounds Lungs: Clear to auscultation, breathing unlabored, no rales or wheezes Abdomen: Positive bowel sounds, soft nontender to palpation, nondistended Extremities: No clubbing, cyanosis, or edema Skin: No evidence of breakdown, no evidence of rash  Dysarthric but intelligible Mild left inattention ongoing  He is able to follow simple one step commands. Fair awareness, STM deficits Motor: RUE: 4+/5 proximal distal, unchanged RLE: 4+/5 proximal distal LUE/LLE: 5/5 proximal distal    Assessment/Plan: 1. Functional deficits secondary to acute left caudate and putamen infarct which require 3+ hours per day of interdisciplinary therapy in a comprehensive inpatient rehab setting.  Physiatrist is providing close team supervision and 24 hour management of active medical problems listed below.  Physiatrist and rehab team continue to assess barriers to discharge/monitor patient progress toward functional and medical goals  Care Tool:  Bathing    Body parts bathed by patient: Right arm, Left arm, Chest, Abdomen, Front perineal area, Right upper leg, Left  upper leg, Face, Left lower leg, Right lower leg, Buttocks   Body parts bathed by helper: Right lower leg, Left lower leg, Buttocks     Bathing assist Assist Level: Supervision/Verbal cueing     Upper Body Dressing/Undressing Upper body dressing   What is the patient wearing?: Pull over shirt    Upper body assist Assist Level: Set up assist    Lower Body Dressing/Undressing Lower body dressing      What is the patient wearing?: Pants, Incontinence brief     Lower body assist Assist for lower body dressing: Minimal Assistance - Patient > 75%     Toileting Toileting    Toileting assist Assist for toileting: Supervision/Verbal cueing     Transfers Chair/bed transfer  Transfers assist     Chair/bed transfer assist level: Supervision/Verbal cueing     Locomotion Ambulation   Ambulation assist      Assist level: Supervision/Verbal cueing Assistive device: Walker-rolling Max distance: 200   Walk 10 feet activity   Assist     Assist level: Supervision/Verbal cueing Assistive device: Walker-rolling   Walk 50 feet activity   Assist Walk 50 feet with 2 turns activity did not occur: Safety/medical concerns (decreased activity tolerance)  Assist level: Supervision/Verbal cueing Assistive device: Walker-rolling    Walk 150 feet activity   Assist Walk 150 feet activity did not occur: Safety/medical concerns  Assist level: Supervision/Verbal cueing Assistive device: Walker-rolling    Walk 10 feet on uneven surface  activity   Assist Walk 10 feet on uneven surfaces activity did  not occur: Safety/medical concerns (decreased balance/activity tolerance)         Wheelchair     Assist Will patient use wheelchair at discharge?: No Type of Wheelchair: Manual    Wheelchair assist level: Supervision/Verbal cueing Max wheelchair distance: 81 ft    Wheelchair 50 feet with 2 turns activity    Assist        Assist Level:  Supervision/Verbal cueing   Wheelchair 150 feet activity     Assist     Assist Level: Moderate Assistance - Patient 50 - 74%      Medical Problem List and Plan: 1.  Dysarthria, right hemiparesis with numbness, right inattention and impulsivity secondary to acute left caudate and putamen infarct with involvement in insular, fronto-parietal cortical and subcortical brain and mild swelling.   Continue CIR. Looks like family cannot provide assist---would rec SNF-   Team conference today please see physician documentation under team conference tab, met with team  to discuss problems,progress, and goals. Formulized individual treatment plan based on medical history, underlying problem and comorbidities.  2.  Antithrombotics: -DVT/anticoagulation  Pharmaceutical: Lovenox             -antiplatelet therapy: Low dose ASA 3. Pain Management: Well controlled.  4. Mood: LCSW to follow for evaluation and support.              -antipsychotic agents: N/a 5. Neuropsych: This patient is ?fully capable of making decisions on his own behalf. 6. Skin/Wound Care: Routine pressure relief measures.  7. Fluids/Electrolytes/Nutrition: Monitor I/Os.  CMP ordered. 8. Hypotension: Continue to monitor BP   ProAmatine increased to 10 mg tid on 7/11   Vitals:   11/13/19 1943 11/14/19 0543  BP: (!) 99/59 92/65  Pulse: (!) 53 (!) 52  Resp: 16 17  Temp: 97.8 F (36.6 C) 97.6 F (36.4 C)  SpO2: 98% 99%  BPs soft but pt refused orthostatic vitals - denies symptoms of dizziness with standing    -continue TEDs, binder 9. 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 Delton Coombes. Last infusion  06/24 and 7/15.    -compazine prn for nausea Pt does not qualify for hospice from an onc standpoint and CVA was relatively mild  10. CKD IIIb: Baseline SCr 1.7-1.8 range per records. Will continue to monitor.              Creatinine 1.47 on 7/10, 1.65 on 7/12, 1.49 on  7/17. - at baseline cont to monitor weekly  11. Polysubstance abuse: Continue to counsel on cessation.  12. Anemia of chronic disease:  Will continue to monitor.              Hemoglobin 9.6 on 7/11, 8.8 on 7/12 13.  Hyponatremia  Sodium 133 on 7/10, 131 on 7/12, 8.8 on 7/19  May be contributing to #8  Twice daily salt tabs ordered  7/13: Na is 134 14.  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  But within 1-2 kg range    LOS: 11 days A FACE TO FACE EVALUATION WAS PERFORMED  Charlett Blake 11/14/2019, 8:49 AM

## 2019-11-14 NOTE — Plan of Care (Signed)
  Problem: RH SAFETY Goal: RH STG ADHERE TO SAFETY PRECAUTIONS W/ASSISTANCE/DEVICE Description: STG Adhere to Safety Precautions With SUPERVISION Assistance/Device.  Outcome: Progressing

## 2019-11-14 NOTE — Progress Notes (Signed)
Patient ID: Jeff [name], male   DOB: 05/31/1945, 74 y.o.   MRN: [id number]   Sw left voicemail with [facility] admission director, Jackelyn Poling to follow up on auth

## 2019-11-14 NOTE — Progress Notes (Signed)
Patient ID: Jeff [name], male   DOB: [birth year], 74 y.o.   MRN: [id number] Team Conference Report to Patient/Family  Team Conference discussion was reviewed with the patient and caregiver, including goals, any changes in plan of care and target discharge date.  Patient and caregiver express understanding and are in agreement.  The patient has a target discharge date of 11/15/19 (SNF placement pending).  Dyanne Iha 11/14/2019, 1:50 PM

## 2019-11-14 NOTE — Progress Notes (Signed)
Physical Therapy Session Note  Patient Details  Name: Jeff [name] MRN: [id number] Date of Birth: 1945/10/27  Today's Date: 11/14/2019 PT Individual Time: 1130-1158 and 1455-1525 PT Individual Time Calculation (min): 28 min and 30 min  Short Term Goals: Week 2:  PT Short Term Goal 1 (Week 2): STG=LTG due to ELOS  Skilled Therapeutic Interventions/Progress Updates: Tx1: Pt presented in recliner agreeable to therapy. Pt denies pain during session. Pt ambulated to toilet with RW and supervision (+void). Pt then ambulated to sink to perform hand hygiene in standing with RW. Pt then ambulation to day room with RW and supervision. Pt participated in several bouts of corn hole without AD standing on level tile and airex. Pt was able to perform throws in semi-tandem stance on level tile with close S as well as close S while on Airex. Pt also ambulated around room picking up bean bags from floor with RW initially CGA fading to close S. Pt ambulated back to room at end of session and returned to recliner. Pt left in recliner with belt alarm on, call bell within reach and needs met.   Tx2: Pt presented in recliner agreeable to therapy. Pt denies pain throughout session. Session focused on functional transfers in preparation for d/c. Pt ambulated to ADL apt with RW and supervision. Pt performed furniture transfers and bed mobility mod I demonstrating good safety. Pt then ambulated to rehab gym with supervision and performed floor transfer holding onto mat with supervision. Pt also participated in obstacle course including gait on compliant surface, stepping over thresholds, and ascending/descending stairs. All gait was performed with supervision and stairs performed with CGA and verbal cues to place foot fully on step. Pt ambulated back to room at end of session and encouraged to toilet prior to returning back to chair. Pt performed toilet transfers with supervision(+BM), pt performed peri-care mod I with PTA  providing wet washcloth for cleanliness. Pt returned to recliner at end of session and left with belt alarm on, call bell within reach and needs met.       Therapy Documentation Precautions:  Precautions Precautions: Fall Precaution Comments: R weakness UE>LE, R inattention, posterior lean in standing, low BP Restrictions Weight Bearing Restrictions: No General:   Vital Signs:  Pain: Pain Assessment Pain Scale: 0-10 Pain Score: 0-No pain    Therapy/Group: Individual Therapy  Darlene Bartelt  Shin Lamour, PTA  11/14/2019, 12:16 PM

## 2019-11-14 NOTE — Patient Care Conference (Signed)
Inpatient RehabilitationTeam Conference and Plan of Care Update Date: 11/14/2019   Time: 1:25 PM    Patient Name: Jeff [name]      Medical Record Number: [id number]  Date of Birth: 1945/07/30 Sex: Male         Room/Bed: 4W21C/4W21C-01 Payor Info: Payor: Theme park manager MEDICARE / Plan: UHC MEDICARE / Product Type: *No Product type* /    Admit Date/Time:  11/03/2019  2:46 PM  Primary Diagnosis:  Acute ischemic stroke [facility])  Hospital Problems: Principal Problem:   Acute ischemic stroke ([place]) Active Problems:   Anemia of chronic disease   B12 deficiency   CKD (chronic kidney disease) stage 3, GFR 30-59 ml/min   Subcortical infarction (H[facility])   Chronic combined systolic and diastolic congestive heart failure ([place])   Hyponatremia    Expected Discharge Date: Expected Discharge Date: 11/15/19 (SNF placement pending)  Team Members Present: Physician leading conference: Dr. Alysia Penna Care Coodinator Present: Erlene Quan, BSW;Imri Lor Hervey Ard, RN, BSN, [place] Nurse Present: Debroah Loop, RN PT Present: Phylliss Bob, PTA OT Present: Darleen Crocker, OT SLP Present: Weston Anna, SLP PPS Coordinator present : Ileana Ladd, Burna Mortimer, SLP     Current Status/Progress Goal Weekly Team Focus  Bowel/Bladder   Incontinent of B/B  Regain continence of B/B  Assess toileting needs PRN   Swallow/Nutrition/ Hydration             ADL's   supervision overall with use of AD and needing abdominal binder, B thigh high TEDs, and ACE wraps for hypotension  supervision  R NMR, self care retraining, cognition, functional transfers, balance, pt/family education, d/c planning   Mobility   mod I bed mobility, supervision transfers, gait with RW, supervision to CGA stair with B rails  mod bed mobiltiy and transfers, supervision gait  higher level balance, endurance, d/c planning   Communication   phrase min A  Min A - downgraded 7/20  education and use of speech  intelligibility strategies   Safety/Cognition/ Behavioral Observations  Mod-Min A  Min A, Mod A - downgraded intellectual awareness goal  education, basic problem solving, recall strategies and intellectual awareness   Pain   No pain  Remain Pain free  Assess pain QShift   Skin   Skin clean, dry, intact  Maintain good skin integrity  Assess skin QShift     Team Discussion:  Discharge Planning/Teaching Needs:  Goal for patient to discharge to SNF [facility]) pending indurance authorization  N/A    Current Update: On target.  Current Barriers to Discharge:  Incontinence, Lack of/limited family support, Pending chemo/radiation and Not eligible for Hospice care  Possible Resolutions to Barriers: Timed toileting protocol continued   Patient on target to meet rehab goals: yes, SLP downgraded goals for cognition  *See Care Plan and progress notes for long and short-term goals.   Revisions to Treatment Plan:  Recommended SNF  disposition for discharge instead of own apartment; due to inability to manage care for orthostasis (Ace wraps, TEDs and binder don/doff) and urinary incontinence with limited support availability and intermittent supervision.    Medical Summary Current Status: incont bowel and bladder Weekly Focus/Goal: BPs still low but denies dizziness     Possible Resolutions to Barriers: improve bladder awareness,   Continued Need for Acute Rehabilitation Level of Care: The patient requires daily medical management by a physician with specialized training in physical medicine and rehabilitation for the following reasons: Direction of a multidisciplinary physical rehabilitation program to maximize functional independence :  Yes Medical management of patient stability for increased activity during participation in an intensive rehabilitation regime.: Yes Analysis of laboratory values and/or radiology reports with any subsequent need for medication adjustment and/or  medical intervention. : Yes   I attest that I was present, lead the team conference, and concur with the assessment and plan of the team.   Dorien Chihuahua B 11/14/2019, 1:25 PM

## 2019-11-14 NOTE — Progress Notes (Signed)
Occupational Therapy Session Note  Patient Details  Name: Jeff [name] MRN: [id number] Date of Birth: 1945-11-03  Today's Date: 11/14/2019 OT Individual Time: 9977-4142 OT Individual Time Calculation (min): 27 min    Short Term Goals: Week 1:  OT Short Term Goal 1 (Week 1): STG=LTG  Skilled Therapeutic Interventions/Progress Updates:    Pt received supine watching TV, struggling to turn down volume. No c/o pain. Pt oriented x4 and with very flat affect throughout session. Pt already wearing thigh high teds, abdominal binder donned EOB. BP obtained EOB- pt asymptomatic 86/62. Observed pt was incontinent of urine and pt returned to supine for OT to don ace wraps bilaterally. Pt returned to EOB and stood to change soiled brief and don new pants, CGA overall. Pt used RW to transfer to recliner, CGA. BP seated 89/68 and pt asymptomatic, wanting to stay sitting in recliner to watch tv.  Pt was left sitting up with all needs met, chair alarm set and footrest put up to further assist with hypotension.   Therapy Documentation Precautions:  Precautions Precautions: Fall Precaution Comments: R weakness UE>LE, R inattention, posterior lean in standing, low BP Restrictions Weight Bearing Restrictions: No   Therapy/Group: Individual Therapy  Curtis Sites 11/14/2019, 7:19 AM

## 2019-11-14 NOTE — Progress Notes (Signed)
Occupational Therapy Session Note  Patient Details  Name: Jeff [name] MRN: [id number] Date of Birth: [birth year]  Today's Date: 11/14/2019 OT Individual Time: 1300-1409 OT Individual Time Calculation (min): 69 min    Short Term Goals: Week 1:  OT Short Term Goal 1 (Week 1): STG=LTG  Skilled Therapeutic Interventions/Progress Updates:    Upon entering the room, pt seated in recliner chair having just finished lunch. Pt agreeable to OT intervention this session. Pt declines needed to use bathroom. Sit >stand with RW at mod I level and pt ambulating to wheelchair with supervision for safety. BP results of 94/65 and pt asymptomatic. OT assisted pt via wheelchair outside for fresh air and sun per pt request. Pt demonstrated ability to push wheelchair with supervision and increased time for B UE strengthening. Pt seated in wheelchair outside and was very talkative with therapist. Pt laughing and discussing home environment without prompting to do so. Pt verbalizes, " At home I  sit outside like this too. I like this." OT presented pt with unfamiliar card game and pt utilized R UE to select cards and flip cards. Pt with difficulty trying to slide cards from deck while in the palm of hand and becoming frustrated with task as it was more difficult. Pt returning back to room in same manner. Pt standing from wheelchair and ambulating into bathroom with supervision and use of RW. Pt performed clothing management with supervision but unable to void. Pt returning to sit in recliner chair with chair alarm belt donned and activated with call bell within reach upon exiting the room.   Therapy Documentation Precautions:  Precautions Precautions: Fall Precaution Comments: R weakness UE>LE, R inattention, posterior lean in standing, low BP Restrictions Weight Bearing Restrictions: No Vital Signs: Therapy Vitals Temp: 98 F (36.7 C) Temp Source: Oral Pulse Rate: 75 Resp: 20 BP: 93/61 Patient Position (if  appropriate): Sitting Oxygen Therapy SpO2: 100 % O2 Device: Room Air ADL: ADL Eating: Set up Where Assessed-Eating: Chair Grooming: Minimal assistance Where Assessed-Grooming: Sitting at sink Upper Body Bathing: Minimal assistance Where Assessed-Upper Body Bathing: Sitting at sink Lower Body Bathing: Moderate assistance Where Assessed-Lower Body Bathing: Sitting at sink Upper Body Dressing: Minimal assistance Where Assessed-Upper Body Dressing: Standing at sink Lower Body Dressing: Moderate assistance Where Assessed-Lower Body Dressing: Sitting at sink, Standing at sink Toileting: Minimal assistance Where Assessed-Toileting: Bedside Commode   Therapy/Group: Individual Therapy  Gypsy Decant 11/14/2019, 2:46 PM

## 2019-11-15 ENCOUNTER — Inpatient Hospital Stay (HOSPITAL_COMMUNITY): Payer: Medicare Other | Admitting: Occupational Therapy

## 2019-11-15 ENCOUNTER — Inpatient Hospital Stay (HOSPITAL_COMMUNITY): Payer: Medicare Other

## 2019-11-15 ENCOUNTER — Inpatient Hospital Stay (HOSPITAL_COMMUNITY): Payer: Medicare Other | Admitting: Speech Pathology

## 2019-11-15 NOTE — Progress Notes (Signed)
Patient ID: Jeff [name], male   DOB: 1945/12/18, 74 y.o.   MRN: [id number]  Sw has informed friend [name]) that if patient appeal is denied we would need to begin planning patient a safe discharge home. He understands and will be expecting call from Nurse coordinator   [phone] [name])

## 2019-11-15 NOTE — Progress Notes (Signed)
Speech Language Pathology Daily Session Note  Patient Details  Name: Jeff [name] MRN: [id number] Date of Birth: [birth year]  Today's Date: 11/15/2019 SLP Individual Time: 1300-1341 SLP Individual Time Calculation (min): 41 min  Short Term Goals: Week 2: SLP Short Term Goal 1 (Week 2): Continue STG=LTG due to longer than expected ELOS  Skilled Therapeutic Interventions: Pt was seen for skilled ST targeting cognition. SLP facilitated session with handouts and verbal review of compensatory strategies for memory and speech intelligibility. SLP also attempted to target a monthly calendar/scheduling task, but due to visual impairments it was not functional, therefore target shifted to functional money management. Pt required Min A verbal and visual cues for problem solving very basic money scenario (grovery) questions, however increased Mod-Max required to problem solving of any slight increase in complexity. In functional conversation targeting awareness, he required Mod A verbal cues to identify 1 ST goal/target.  Pt left sitting in recliner with alarm set and needs within reach. Continue per current plan of care.          Pain Pain Assessment Pain Scale: 0-10 Pain Score: 0-No pain  Therapy/Group: Individual Therapy  Jeff [name] 11/15/2019, 7:26 AM

## 2019-11-15 NOTE — Progress Notes (Signed)
Patient ID: Jeff [name], male   DOB: 12-Oct-1945, 74 y.o.   MRN: [id number]  Sw has put in appeal with [facility] Medicare Appeals and Grievances, Decision will be made within 72 hrs. Sw has provided information to them follow up contact information of Neoma Laming, Nurse Case Freight forwarder. Due to sw being out.  Appeal taken by:  Angelita Ingles [phone] Authorization number: [id number] Reference Number: [id number]

## 2019-11-15 NOTE — Progress Notes (Signed)
Physical Therapy Session Note  Patient Details  Name: Jeff [name] MRN: [id number] Date of Birth: 12/05/1945  Today's Date: 11/15/2019 PT Individual Time: 0854-0954 PT Individual Time Calculation (min): 60 min   Short Term Goals: Week 1:  PT Short Term Goal 1 (Week 1): Patient will perform transfer with CGA. PT Short Term Goal 1 - Progress (Week 1): Met PT Short Term Goal 2 (Week 1): Patient will perform dynamic standing balance with CGA >10 min. PT Short Term Goal 2 - Progress (Week 1): Met PT Short Term Goal 3 (Week 1): Patient will ambulate >100 feet with CGA using LRAD. PT Short Term Goal 3 - Progress (Week 1): Discontinued (comment) Week 2:  PT Short Term Goal 1 (Week 2): STG=LTG due to ELOS  Skilled Therapeutic Interventions/Progress Updates:    Trial of positions without Abdominal binder and ACE wraps to assess what BP values may be like at home if patient unable to get all these items on independently (only potential intermittent assist available unless D/C to SNF approved).   90/64 mmHg in supine with HOB elevated (tedhose only) - asymptomatic 77/62 mmHg EOB (tedhose only) - asymptomatic 68/58 mmHg standing (tedhose only) - asymptomatic  Focused on pt donning abdominal binder to increase independence and pt was able to do it with cues.   99/59 mmHg seated EOB with tedhose and abdominal binder (asymptomatic) 76/57 mmHg standing with tedhose and abdominal binder (asymptomatic)  Functional gait into bathroom without AD with CGA to attempt toileting (pt with incontinence and brief soiled). Supervision for toileting and clothing management and performing standing hygiene with wet wash cloth.   97/73 mmHg after functional gait down to therapy gym with RW with supervision  Simulated car transfer for d/c preparation with supervision using RW. Gait over uneven mulched surface, curb step, and up/down ramp without AD to challenge balance with overall CGA with min for stair  neogitation without rails.  NMR on Kinetron for reciprocal movement, balance, and strengthening in standing. Strengthening x 1 min trials x 3 reps with UE support and resistance 70 cm/sec  Mod I transfer back to recliner end of session.      Therapy Documentation Precautions:  Precautions Precautions: Fall Precaution Comments: R weakness UE>LE, R inattention, posterior lean in standing, low BP Restrictions Weight Bearing Restrictions: No Pain: Pain Assessment Pain Scale: 0-10 Pain Score: 0-No pain   Therapy/Group: Individual Therapy  Gray, Alison Brescia  Alison B. Gray, PT, DPT, CBIS  11/15/2019, 10:52 AM  

## 2019-11-15 NOTE — Progress Notes (Signed)
[place] PHYSICAL MEDICINE & REHABILITATION PROGRESS NOTE  Subjective/Complaints:  Appreciate SW notes , discussed urinary incont, pt states he can handle urinal and recommendation to try using urinal every 1-2 hrs since pt has poor awareness  ROS: Patient denies CP, SOB, N/V/D     Objective: Vital Signs: Blood pressure 93/64, pulse 62, temperature 98.7 F (37.1 C), resp. rate 15, height 6' (1.829 m), weight 70.9 kg, SpO2 100 %. No results found. No results for input(s): WBC, HGB, HCT, PLT in the last 72 hours. No results for input(s): NA, K, CL, CO2, GLUCOSE, BUN, CREATININE, CALCIUM in the last 72 hours.  Physical Exam: BP 93/64 (BP Location: Right Arm)   Pulse 62   Temp 98.7 F (37.1 C)   Resp 15   Ht 6' (1.829 m)   Wt 70.9 kg   SpO2 100%   BMI 21.20 kg/m    General: No acute distress Mood and affect are appropriate Heart: Regular rate and rhythm no rubs murmurs or extra sounds Lungs: Clear to auscultation, breathing unlabored, no rales or wheezes Abdomen: Positive bowel sounds, soft nontender to palpation, nondistended Extremities: No clubbing, cyanosis, or edema Skin: No evidence of breakdown, no evidence of rash  Dysarthric but intelligible Mild left inattention ongoing  He is able to follow simple one step commands. Fair awareness, STM deficits Motor: RUE: 4+/5 proximal distal, unchanged RLE: 4+/5 proximal distal LUE/LLE: 5/5 proximal distal    Assessment/Plan: 1. Functional deficits secondary to acute left caudate and putamen infarct which require 3+ hours per day of interdisciplinary therapy in a comprehensive inpatient rehab setting.  Physiatrist is providing close team supervision and 24 hour management of active medical problems listed below.  Physiatrist and rehab team continue to assess barriers to discharge/monitor patient progress toward functional and medical goals  Care Tool:  Bathing    Body parts bathed by patient: Right arm, Left arm,  Chest, Abdomen, Front perineal area, Right upper leg, Left upper leg, Face, Left lower leg, Right lower leg, Buttocks   Body parts bathed by helper: Right lower leg, Left lower leg, Buttocks     Bathing assist Assist Level: Supervision/Verbal cueing     Upper Body Dressing/Undressing Upper body dressing   What is the patient wearing?: Pull over shirt    Upper body assist Assist Level: Set up assist    Lower Body Dressing/Undressing Lower body dressing      What is the patient wearing?: Pants, Incontinence brief     Lower body assist Assist for lower body dressing: Minimal Assistance - Patient > 75%     Toileting Toileting    Toileting assist Assist for toileting: Supervision/Verbal cueing     Transfers Chair/bed transfer  Transfers assist     Chair/bed transfer assist level: Supervision/Verbal cueing     Locomotion Ambulation   Ambulation assist      Assist level: Supervision/Verbal cueing Assistive device: Walker-rolling Max distance: 136ft   Walk 10 feet activity   Assist     Assist level: Supervision/Verbal cueing Assistive device: Walker-rolling   Walk 50 feet activity   Assist Walk 50 feet with 2 turns activity did not occur: Safety/medical concerns (decreased activity tolerance)  Assist level: Supervision/Verbal cueing Assistive device: Walker-rolling    Walk 150 feet activity   Assist Walk 150 feet activity did not occur: Safety/medical concerns  Assist level: Supervision/Verbal cueing Assistive device: Walker-rolling    Walk 10 feet on uneven surface  activity   Assist Walk 10 feet on  uneven surfaces activity did not occur: Safety/medical concerns (decreased balance/activity tolerance)   Assist level: Contact Guard/Touching assist Assistive device: Aeronautical engineer Will patient use wheelchair at discharge?: No Type of Wheelchair: Manual    Wheelchair assist level: Supervision/Verbal  cueing Max wheelchair distance: 81 ft    Wheelchair 50 feet with 2 turns activity    Assist        Assist Level: Supervision/Verbal cueing   Wheelchair 150 feet activity     Assist     Assist Level: Moderate Assistance - Patient 50 - 74%      Medical Problem List and Plan: 1.  Dysarthria, right hemiparesis with numbness, right inattention and impulsivity secondary to acute left caudate and putamen infarct with involvement in insular, fronto-parietal cortical and subcortical brain and mild swelling.   Continue CIR. Looks like family cannot provide assist---would rec SNF-should be get to an intermittent sup level after 3 wk in SNF    2.  Antithrombotics: -DVT/anticoagulation  Pharmaceutical: Lovenox             -antiplatelet therapy: Low dose ASA 3. Pain Management: Well controlled.  4. Mood: LCSW to follow for evaluation and support.              -antipsychotic agents: N/a 5. Neuropsych: This patient is ?fully capable of making decisions on his own behalf. 6. Skin/Wound Care: Routine pressure relief measures.  7. Fluids/Electrolytes/Nutrition: Monitor I/Os.  CMP ordered. 8. Hypotension: Continue to monitor BP   ProAmatine increased to 10 mg tid on 7/11   Vitals:   11/15/19 0442 11/15/19 0455  BP: 92/61 93/64  Pulse: (!) 56 62  Resp: 15 15  Temp: 97.9 F (36.6 C) 98.7 F (37.1 C)  SpO2: 99% 100%  BPs soft but pt refused orthostatic vitals - denies symptoms of dizziness with standing    -continue TEDs, binder 9. 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 Delton Coombes. Last infusion  06/24 and 7/15.    -compazine prn for nausea Pt does not qualify for hospice from an onc standpoint and CVA was relatively mild  10. CKD IIIb: Baseline SCr 1.7-1.8 range per records. Will continue to monitor.              Creatinine 1.47 on 7/10, 1.65 on 7/12, 1.49 on 7/17. - at baseline cont to monitor weekly  11.  Polysubstance abuse: Continue to counsel on cessation.  12. Anemia of chronic disease:  Will continue to monitor.              Hemoglobin 9.6 on 7/11, 8.8 on 7/12 13.  Hyponatremia  Mild last Na+ 134 will recheck in am off Na+ tabs  14.  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  But within 1-2 kg range    LOS: 12 days A FACE TO FACE EVALUATION WAS PERFORMED  Charlett Blake 11/15/2019, 8:47 AM

## 2019-11-15 NOTE — Progress Notes (Signed)
Patient ID: Jeff [name], male   DOB: 01-24-1946, 74 y.o.   MRN: [id number]   Sw received follow up call from [facility]. Patient SNF authorization denied. Will attempt to put in appeal. Will inform family of decision. Appeals Phone number: [phone]  Information provided by: Claiborne Billings, [facility]) [phone]

## 2019-11-15 NOTE — Plan of Care (Signed)
°  Problem: RH SAFETY Goal: RH STG ADHERE TO SAFETY PRECAUTIONS W/ASSISTANCE/DEVICE Description: STG Adhere to Safety Precautions With SUPERVISION Assistance/Device.  Outcome: Progressing

## 2019-11-15 NOTE — Progress Notes (Signed)
Occupational Therapy Session Note  Patient Details  Name: Jeff [name] MRN: [id number] Date of Birth: [birth year]  Today's Date: 11/15/2019 OT Individual Time: 1100-1130 OT Individual Time Calculation (min): 30 min    Short Term Goals: Week 1:  OT Short Term Goal 1 (Week 1): STG=LTG:        Skilled Therapeutic Interventions/Progress Updates:    Pt received in recliner and agreeable to therapy.  In room, pt worked on sit to stand with S and then focused on dynamic standing balance with alternating arm movement and marching in place with occasional CGA.    Pt sat to rest.  Blood pressure 97/61 with abdominal binder and long TED hose.   Seated exercises using light resistance theraband for upper back and shoulders (B arm extension and single arm rows), leg presses, calf presses.  Pt tolerated exercises well. Resting in wc with all needs met. Belt alarm on.   Therapy Documentation Precautions:  Precautions Precautions: Fall Precaution Comments: R weakness UE>LE, R inattention, posterior lean in standing, low BP Restrictions Weight Bearing Restrictions: No   Pain: Pain Assessment Pain Scale: 0-10 Pain Score: 0-No pain   Therapy/Group: Individual Therapy  [place] 11/15/2019, 9:14 AM

## 2019-11-15 NOTE — Progress Notes (Signed)
Occupational Therapy Weekly Progress Note  Patient Details  Name: Jeff [name] MRN: [id number] Date of Birth: 1946/03/29  Beginning of progress report period: November 04, 2019 End of progress report period: November 15, 2019  Today's Date: 11/15/2019 OT Individual Time: 1450-1517 OT Individual Time Calculation (min): 27 min    Patient has met 5 of 11 long term goals.  Short term goals not set due to estimated length of stay. Pt making steady progress towards goals this week. Pt is unable to manage B TED hose, abdominal binder, and ACE wraps to manage hypotension and needs total A to don these items each morning. Pt able to perform UB clothing management with supervision and LB clothing management with supervision as well. Toilet transfer performed with supervision but toileting requries S - min guard depending on task and level of fatigue. Pt continues to have a very flat affect. He is incontinent of bowel and bladder and is unaware of when he needs to go and when he has gone. Pt is making progress with [place] in R hand as well and it appears functional at this time.  Patient continues to demonstrate the following deficits: muscle weakness, decreased cardiorespiratoy endurance, decreased coordination and decreased motor planning, decreased initiation, decreased attention, decreased awareness, decreased problem solving, decreased safety awareness, decreased memory and delayed processing and decreased sitting balance, decreased standing balance, hemiplegia and decreased balance strategies and therefore will continue to benefit from skilled OT intervention to enhance overall performance with BADL and iADL.  See Patient's Care Plan for progression toward long term goals.  Patient progressing toward long term goals.[name] Kitchen  goals upgraded to mod I for sit <>stand   Skilled Therapeutic Interventions/Progress Updates:    Upon entering the room, pt seated in recliner chair. Pt greets therapist and reports no need for  toileting. OT insists pt attempts and pt ambulating from recliner chair with RW and supervision to bathroom. Pt attempting to sit without fully doffing LB clothing items and needing min cuing to do so. Pt's brief soiled and with mild LOB requiring CGA when removing soiled items. Pt having BM while seated on commode and standing for hygiene and to don clean brief with close supervision. Pt standing at sink for hand hygiene with supervision before returning to recliner chair. Chair alarm belt donned and call bell within reach upon exiting the room.   Therapy Documentation Precautions:  Precautions Precautions: Fall Precaution Comments: R weakness UE>LE, R inattention, posterior lean in standing, low BP Restrictions Weight Bearing Restrictions: No General:   Vital Signs: Therapy Vitals Temp: 97.6 F (36.4 C) Temp Source: Oral Pulse Rate: 55 Resp: 18 BP: (!) 94/63 Patient Position (if appropriate): Sitting Oxygen Therapy O2 Device: Room Air Pain: Pain Assessment Pain Scale: 0-10 Pain Score: 0-No pain ADL: ADL Eating: Set up Where Assessed-Eating: Chair Grooming: Minimal assistance Where Assessed-Grooming: Sitting at sink Upper Body Bathing: Minimal assistance Where Assessed-Upper Body Bathing: Sitting at sink Lower Body Bathing: Moderate assistance Where Assessed-Lower Body Bathing: Sitting at sink Upper Body Dressing: Minimal assistance Where Assessed-Upper Body Dressing: Standing at sink Lower Body Dressing: Moderate assistance Where Assessed-Lower Body Dressing: Sitting at sink, Standing at sink Toileting: Minimal assistance Where Assessed-Toileting: Bedside Commode Vision   Perception    Praxis   Exercises:   Other Treatments:     Therapy/Group: Individual Therapy  Gypsy Decant 11/15/2019, 4:40 PM

## 2019-11-16 ENCOUNTER — Inpatient Hospital Stay (HOSPITAL_COMMUNITY): Payer: Medicare Other | Admitting: Occupational Therapy

## 2019-11-16 ENCOUNTER — Inpatient Hospital Stay (HOSPITAL_COMMUNITY): Payer: Medicare Other | Admitting: Physical Therapy

## 2019-11-16 NOTE — Progress Notes (Signed)
PHYSICAL MEDICINE & REHABILITATION PROGRESS NOTE  Subjective/Complaints:  Pt doing higher level activities with PT, appreciate SW notes   ROS: Patient denies CP, SOB, N/V/D     Objective: Vital Signs: Blood pressure (!) 85/55, pulse 59, temperature (!) 97.5 F (36.4 C), temperature source Oral, resp. rate 16, height 6' (1.829 m), weight 71 kg, SpO2 98 %. No results found. No results for input(s): WBC, HGB, HCT, PLT in the last 72 hours. No results for input(s): NA, K, CL, CO2, GLUCOSE, BUN, CREATININE, CALCIUM in the last 72 hours.  Physical Exam: BP (!) 85/55 (BP Location: Right Arm)   Pulse 59   Temp (!) 97.5 F (36.4 C) (Oral)   Resp 16   Ht 6' (1.829 m)   Wt 71 kg   SpO2 98%   BMI 21.23 kg/m   General: No acute distress Mood and affect are appropriate Heart: Regular rate and rhythm no rubs murmurs or extra sounds Lungs: Clear to auscultation, breathing unlabored, no rales or wheezes Abdomen: Positive bowel sounds, soft nontender to palpation, nondistended Extremities: No clubbing, cyanosis, or edema Skin: No evidence of breakdown, no evidence of rash  Dysarthric but intelligible Mild left inattention ongoing  He is able to follow simple one step commands. Fair awareness, STM deficits Motor: RUE: 4+/5 proximal distal, unchanged RLE: 4+/5 proximal distal LUE/LLE: 5/5 proximal distal    Assessment/Plan: 1. Functional deficits secondary to acute left caudate and putamen infarct which require 3+ hours per day of interdisciplinary therapy in a comprehensive inpatient rehab setting.  Physiatrist is providing close team supervision and 24 hour management of active medical problems listed below.  Physiatrist and rehab team continue to assess barriers to discharge/monitor patient progress toward functional and medical goals  Care Tool:  Bathing    Body parts bathed by patient: Right arm, Left arm, Chest, Abdomen, Front perineal area, Right upper leg,  Left upper leg, Face, Left lower leg, Right lower leg, Buttocks   Body parts bathed by helper: Right lower leg, Left lower leg, Buttocks     Bathing assist Assist Level: Supervision/Verbal cueing     Upper Body Dressing/Undressing Upper body dressing   What is the patient wearing?: Pull over shirt    Upper body assist Assist Level: Set up assist    Lower Body Dressing/Undressing Lower body dressing      What is the patient wearing?: Pants, Incontinence brief     Lower body assist Assist for lower body dressing: Minimal Assistance - Patient > 75%     Toileting Toileting    Toileting assist Assist for toileting: Contact Guard/Touching assist     Transfers Chair/bed transfer  Transfers assist     Chair/bed transfer assist level: Independent with assistive device Chair/bed transfer assistive device: Museum/gallery exhibitions officer assist      Assist level: Supervision/Verbal cueing Assistive device: Walker-rolling Max distance: 190ft   Walk 10 feet activity   Assist     Assist level: Supervision/Verbal cueing Assistive device: Walker-rolling   Walk 50 feet activity   Assist Walk 50 feet with 2 turns activity did not occur: Safety/medical concerns (decreased activity tolerance)  Assist level: Supervision/Verbal cueing Assistive device: Walker-rolling    Walk 150 feet activity   Assist Walk 150 feet activity did not occur: Safety/medical concerns  Assist level: Supervision/Verbal cueing Assistive device: Walker-rolling    Walk 10 feet on uneven surface  activity   Assist Walk 10 feet on uneven surfaces activity  did not occur: Safety/medical concerns (decreased balance/activity tolerance)   Assist level: Contact Guard/Touching assist Assistive device: Aeronautical engineer Will patient use wheelchair at discharge?: No Type of Wheelchair: Manual    Wheelchair assist level: Supervision/Verbal  cueing Max wheelchair distance: 81 ft    Wheelchair 50 feet with 2 turns activity    Assist        Assist Level: Supervision/Verbal cueing   Wheelchair 150 feet activity     Assist     Assist Level: Moderate Assistance - Patient 50 - 74%      Medical Problem List and Plan: 1.  Dysarthria, right hemiparesis with numbness, right inattention and impulsivity secondary to acute left caudate and putamen infarct with involvement in insular, fronto-parietal cortical and subcortical brain and mild swelling.   Continue CIR. Looks like family cannot provide assist---would rec SNF-should be get to an intermittent sup level after 3 wk in SNF    2.  Antithrombotics: -DVT/anticoagulation  Pharmaceutical: Lovenox             -antiplatelet therapy: Low dose ASA 3. Pain Management: Well controlled.  4. Mood: LCSW to follow for evaluation and support.              -antipsychotic agents: N/a 5. Neuropsych: This patient is ?fully capable of making decisions on his own behalf. 6. Skin/Wound Care: Routine pressure relief measures.  7. Fluids/Electrolytes/Nutrition: Monitor I/Os.  CMP ordered. 8. Hypotension: Continue to monitor BP   ProAmatine increased to 10 mg tid on 7/11   Vitals:   11/15/19 1937 11/16/19 0442  BP: (!) 106/64 (!) 85/55  Pulse: 53 59  Resp: 16 16  Temp: 97.6 F (36.4 C) (!) 97.5 F (36.4 C)  SpO2: 100% 98%  BPs soft but pt refused orthostatic vitals - denies symptoms of dizziness with standing , weaning compressive garments, , make sure no symptomatic orthostatic drops    -continue TEDs, binder 9. 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 Delton Coombes. Last infusion  06/24 and 7/15.    -compazine prn for nausea Pt does not qualify for hospice from an onc standpoint and CVA was relatively mild  10. CKD IIIb: Baseline SCr 1.7-1.8 range per records. Will continue to monitor.              Creatinine 1.47 on  7/10, 1.65 on 7/12, 1.49 on 7/17. - at baseline cont to monitor weekly  11. Polysubstance abuse: Continue to counsel on cessation.  12. Anemia of chronic disease:  Will continue to monitor.              Hemoglobin 9.6 on 7/11, 8.8 on 7/12 13.  Hyponatremia  Mild last Na+ 134 will recheck in am off Na+ tabs  14.  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  But within 1-2 kg range    LOS: 13 days A FACE TO FACE EVALUATION WAS PERFORMED  Charlett Blake 11/16/2019, 8:39 AM

## 2019-11-16 NOTE — Progress Notes (Signed)
Occupational Therapy Session Note  Patient Details  Name: Jeff [name] MRN: [id number] Date of Birth: [birth year]  Today's Date: 11/16/2019 OT Individual Time: 1001-1056 and 1348-1445 OT Individual Time Calculation (min): 55 min and 57 min  Short Term Goals: Week 2:  OT Short Term Goal 1 (Week 2): STGs=LTGs secondary to upcoming discharge    Skilled Therapeutic Interventions/Progress Updates:    Pt greeted in the recliner wearing his abdominal binder and thigh high Teds. No c/o pain or dizziness, pt requesting to shower. He ambulated into the bathroom using RW with close supervision assistance. After he transferred to the TTB, OT assisted pt with doffing his compression garments. Provided him with a means to prop both LEs in the shower for BP mgt. Pt required vcs to sit as much as possible vs stand for BP mgt and also required vcs to lean to wash buttocks vs stand. Pt completed dressing sit<stand from TTB after drying off, assistance required for brief and compression garments. He then ambulated with RW to the recliner where BP was then assessed, reading 112/76. Pt still with no c/o dizziness at this time. He declined using the bathroom or brushing his teeth. Therefore transitioned to activity of doffing/donning pillowcases for his bed, focus placed on Rt FMC. Afterwards, to work on functional cognition, discussed current events from the newspaper with pt. He reported not having his glasses at the hospital so was unable to read small print but able to read headings of the articles. At end of session pt remained sitting in the recliner with all needs within reach and safety belt fastened.   2nd Session 1:1 tx (57 min) Pt greeted in the recliner with no c/o pain. He wanted to start the session by brushing his teeth. Ambulatory transfer to sink completed using RW with supervision assist. Pt able to meet task demands of brushing teeth unassisted but required cues to use soap during handwashing  afterwards. He then transferred to the w/c. OT escorted him outside via w/c and for remainder of session we worked on [employer] [facility] via meaningful task of handwriting. OT verbally dictated short simple sentences for pt to write down. Note that he needed vcs to remember all the words he had to write, OT had to dictate one word at a time and assist with spelling as needed. No c/o dizziness while outside. At end of session pt was escorted back to the room and completed an ambulatory transfer to the recliner without device and CGA. Pt remained sitting up in the recliner with all needs within reach and safety belt fastened, still no c/o dizziness. Tx focus placed on Rt NMR and functional cognition.   Therapy Documentation Precautions:  Precautions Precautions: Fall Precaution Comments: R weakness UE>LE, R inattention, posterior lean in standing, low BP Restrictions Weight Bearing Restrictions: No Vital Signs: Therapy Vitals Temp: (!) 97.3 F (36.3 C) Pulse Rate: 59 Resp: 16 BP: 102/69 Patient Position (if appropriate): Sitting Oxygen Therapy SpO2: 100 % O2 Device: Room Air ADL: ADL Eating: Set up Where Assessed-Eating: Chair Grooming: Minimal assistance Where Assessed-Grooming: Sitting at sink Upper Body Bathing: Minimal assistance Where Assessed-Upper Body Bathing: Sitting at sink Lower Body Bathing: Moderate assistance Where Assessed-Lower Body Bathing: Sitting at sink Upper Body Dressing: Minimal assistance Where Assessed-Upper Body Dressing: Standing at sink Lower Body Dressing: Moderate assistance Where Assessed-Lower Body Dressing: Sitting at sink, Standing at sink Toileting: Minimal assistance Where Assessed-Toileting: Bedside Commode      Therapy/Group: Individual Therapy  Asli Tokarski A Usbaldo Pannone 11/16/2019,  3:58 PM

## 2019-11-16 NOTE — Progress Notes (Signed)
Physical Therapy Session Note  Patient Details  Name: Jeff [name] MRN: [id number] Date of Birth: 07/25/1945  Today's Date: 11/16/2019 PT Individual Time: 0805-0915 PT Individual Time Calculation (min): 70 min   Short Term Goals: Week 2:  PT Short Term Goal 1 (Week 2): STG=LTG due to ELOS  Skilled Therapeutic Interventions/Progress Updates: Pt presented in bed agreeable to therapy. Pt denies pain throughout session. PTA checked BP in supine without TED hose, abdominal binder and ace bandages 93/66 (75) HR 58. Pt transferred to sitting mod I and BP checked 75/63 (69) HR 85. PTA donned TED hose and pt donned ab binder, BP checked again, 90/71 (78) HR 85. BP checked in standing without ace bandages 75/62 (68) HR 120, HR decreased to 90s once pt in sitting. Pt then ambulated to rehab gym however during ambulation PTA noted that pt with heavy urine smell. Adv to go back to room and use bathroom, at toilet pt noted to be incontinent of bladder and was able to have BM while sitting on toilet. Pt performed toilet transfers with supervision and peri-care with wet washcloth mod I. Pt ambulated to sink and performed hand hygiene at sink in standing. BP checked once completed as MD present for daily assessment BP 126/76/ (87) HR 67. Pt then ambulated to day room and participated in higher balance and gait without AD. Pt ambulated 50ft x 2 no AD with CGA noted narrow BOS and increased lateral sway. With tactile cues pt was able to improve posture and stride length improved with distance. Educated pt that we will continue to recommend ambulation with RW due to improved stability and energy conservation with pt in agreement. Pt also participated in obstacle course weaving through cones and stepping over thresholds without AD and CGA with no LOB noted. Pt also performed toe taps to target forwards and backwards with CGA and no LOB. Pt ambulated to NuStep and participated in NuStep L4 x 10 min for general conditioning.  BP checked after NuStep 108/72 (83) HR 75. Pt ambulated back to room no AD with CGA, narrow BOS and slightly forward flexed posture however no sway noted. Pt left in recliner at end of session with belt alarm on, call bell within reach and needs met.      Therapy Documentation Precautions:  Precautions Precautions: Fall Precaution Comments: R weakness UE>LE, R inattention, posterior lean in standing, low BP Restrictions Weight Bearing Restrictions: No   Other Treatments:      Therapy/Group: Individual Therapy  Rosita DeChalus  Rosita DeChalus, PTA  11/16/2019, 12:57 PM  

## 2019-11-17 ENCOUNTER — Inpatient Hospital Stay (HOSPITAL_COMMUNITY): Payer: Medicare Other | Admitting: Physical Therapy

## 2019-11-17 ENCOUNTER — Inpatient Hospital Stay (HOSPITAL_COMMUNITY): Payer: Medicare Other | Admitting: Speech Pathology

## 2019-11-17 NOTE — Progress Notes (Signed)
Speech Language Pathology Daily Session Note  Patient Details  Name: Jeff [name] MRN: [id number] Date of Birth: [birth year]  Today's Date: 11/17/2019 SLP Individual Time: 1001-1056 SLP Individual Time Calculation (min): 55 min  Short Term Goals: Week 2: SLP Short Term Goal 1 (Week 2): Continue STG=LTG due to longer than expected ELOS  Skilled Therapeutic Interventions: Pt was seen for skilled ST targeting cognitive goals. SLP facilitated session with a grocery ad activity targeting memory and problem solving. Pt created a utilized list making to create a grocery list with Min A for creation and use as an aid for recall throughout task. He required overall Mod A verbal cues for a use of a basic problem solving strategy to locate items within the ad, but recorded their associated prices without any assistance. SLP also targeted a basic card task, which pt initially reported was familiar, however Max verbal cues required for recall of instructions. Min cues for use of an external aid required for recall, and Mod A for problem solving and error awareness. Pt with relatively good insight into level of difficulty card task presented, reporting it was "hard" when questioned by SLP about performance. Pt left sitting in recliner with alarm set and needs within reach. Continue per current plan of care.       Pain Pain Assessment Pain Scale: 0-10 Pain Score: 0-No pain  Therapy/Group: Individual Therapy  Arbutus Leas 11/17/2019, 7:29 AM

## 2019-11-17 NOTE — Progress Notes (Signed)
[place] PHYSICAL MEDICINE & REHABILITATION PROGRESS NOTE  Subjective/Complaints: Patient seen sitting up in bed this morning.  He states he slept well overnight.  He denies complaints.  ROS: Denies CP, SOB, N/V/D     Objective: Vital Signs: Blood pressure (!) 92/64, pulse 53, temperature 98 F (36.7 C), temperature source Oral, resp. rate 18, height 6' (1.829 m), weight 72.4 kg, SpO2 99 %. No results found. No results for input(s): WBC, HGB, HCT, PLT in the last 72 hours. No results for input(s): NA, K, CL, CO2, GLUCOSE, BUN, CREATININE, CALCIUM in the last 72 hours.  Physical Exam: BP (!) 92/64 (BP Location: Right Arm)    Pulse 53    Temp 98 F (36.7 C) (Oral)    Resp 18    Ht 6' (1.829 m)    Wt 72.4 kg    SpO2 99%    BMI 21.65 kg/m  Constitutional: No distress . Vital signs reviewed. HENT: Normocephalic.  Atraumatic. Eyes: EOMI. No discharge. Cardiovascular: No JVD. Respiratory: Normal effort.  No stridor. GI: Non-distended. Skin: Warm and dry.  Intact. Psych: Normal mood.  Normal behavior. Musc: No edema in extremities.  No tenderness in extremities. Neuro: Alert Motor: Dysarthria Motor: RUE: 4+-5/5 proximal distal RLE: 4+-5/5 proximal distal LUE/LLE: 5/5 proximal distal    Assessment/Plan: 1. Functional deficits secondary to acute left caudate and putamen infarct which require 3+ hours per day of interdisciplinary therapy in a comprehensive inpatient rehab setting.  Physiatrist is providing close team supervision and 24 hour management of active medical problems listed below.  Physiatrist and rehab team continue to assess barriers to discharge/monitor patient progress toward functional and medical goals  Care Tool:  Bathing    Body parts bathed by patient: Right arm, Left arm, Chest, Abdomen, Front perineal area, Right upper leg, Left upper leg, Face, Left lower leg, Right lower leg, Buttocks   Body parts bathed by helper: Right lower leg, Left lower leg,  Buttocks     Bathing assist Assist Level: Supervision/Verbal cueing     Upper Body Dressing/Undressing Upper body dressing   What is the patient wearing?: Pull over shirt    Upper body assist Assist Level: Set up assist    Lower Body Dressing/Undressing Lower body dressing      What is the patient wearing?: Pants, Incontinence brief     Lower body assist Assist for lower body dressing: Minimal Assistance - Patient > 75%     Toileting Toileting    Toileting assist Assist for toileting: Contact Guard/Touching assist     Transfers Chair/bed transfer  Transfers assist     Chair/bed transfer assist level: Supervision/Verbal cueing Chair/bed transfer assistive device: Programmer, multimedia   Ambulation assist      Assist level: Supervision/Verbal cueing Assistive device: Walker-rolling Max distance: 180ft   Walk 10 feet activity   Assist     Assist level: Supervision/Verbal cueing Assistive device: Walker-rolling   Walk 50 feet activity   Assist Walk 50 feet with 2 turns activity did not occur: Safety/medical concerns (decreased activity tolerance)  Assist level: Supervision/Verbal cueing Assistive device: Walker-rolling    Walk 150 feet activity   Assist Walk 150 feet activity did not occur: Safety/medical concerns  Assist level: Supervision/Verbal cueing Assistive device: Walker-rolling    Walk 10 feet on uneven surface  activity   Assist Walk 10 feet on uneven surfaces activity did not occur: Safety/medical concerns (decreased balance/activity tolerance)   Assist level: Contact Guard/Touching assist Assistive device:  Walker-rolling   Wheelchair     Assist Will patient use wheelchair at discharge?: No Type of Wheelchair: Agricultural engineer assist level: Supervision/Verbal cueing Max wheelchair distance: 81 ft    Wheelchair 50 feet with 2 turns activity    Assist        Assist Level: Supervision/Verbal  cueing   Wheelchair 150 feet activity     Assist     Assist Level: Moderate Assistance - Patient 50 - 74%      Medical Problem List and Plan: 1.  Dysarthria, right hemiparesis with numbness, right inattention and impulsivity secondary to acute left caudate and putamen infarct with involvement in insular, fronto-parietal cortical and subcortical brain and mild swelling.   Continue CIR.  2.  Antithrombotics: -DVT/anticoagulation  Pharmaceutical: Lovenox             -antiplatelet therapy: Low dose ASA 3. Pain Management: As needed meds 4. Mood: LCSW to follow for evaluation and support.              -antipsychotic agents: N/a 5. Neuropsych: This patient is ?fully capable of making decisions on his own behalf. 6. Skin/Wound Care: Routine pressure relief measures.  7. Fluids/Electrolytes/Nutrition: Monitor I/Os.   8. Hypotension: Continue to monitor BP   ProAmatine increased to 10 mg tid on 7/11   Vitals:   11/16/19 1938 11/17/19 0438  BP: 95/67 (!) 92/64  Pulse: 62 53  Resp: 16 18  Temp: 97.7 F (36.5 C) 98 F (36.7 C)  SpO2: 98% 99%   BP remain soft on 7/24, but asymptomatic  Orthostatics negative   -continue TEDs, binder 9. 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 Delton Coombes. Last infusion  06/24 and 7/15.    -compazine prn for nausea  Pt does not qualify for hospice from an onc standpoint and CVA was relatively mild  10. CKD IIIb: Baseline SCr 1.7-1.8 range per records. Will continue to monitor.              Creatinine 1.49 on 7/17, labs ordered for Monday 11. Polysubstance abuse: Continue to counsel on cessation.  12. Anemia of chronic disease:  Will continue to monitor.              Hemoglobin 8.8 on 7/19 13.  Hyponatremia  Sodium 134 on 7/13, labs ordered for Monday 14.  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   Trending up on 7/24   LOS: 14 days A FACE TO FACE EVALUATION WAS PERFORMED  Chardai Gangemi Lorie Phenix 11/17/2019, 1:28 PM

## 2019-11-17 NOTE — Progress Notes (Signed)
Physical Therapy Session Note  Patient Details  Name: Jeff [name] MRN: [id number] Date of Birth: 19-May-1945  Today's Date: 11/17/2019 PT Individual Time: 0900-0945 PT Individual Time Calculation (min): 45 min   Short Term Goals: Week 2:  PT Short Term Goal 1 (Week 2): STG=LTG due to ELOS  Skilled Therapeutic Interventions/Progress Updates:    Pt received supine in bed, agreeable to PT session. No complaints of pain. Supine BP 98/64 with no TEDs, ACE wrap, or abdominal binder. Bed mobility independent. Seated BP 81/64. Assisted pt with donning thigh-high TEDs, BLE ACE wrap, and pants. Seated BP 93/61. Assisted pt with donning abdominal binder. Sit to stand with Supervision to RW throughout session. Ambulation 2 x 150 ft with RW at Supervision level, flexed trunk posture and cues needed for safe RW management. Seated BP 105/67 following ambulation with use of TEDs, ACE wrap, and abdominal binder. Pt remains asymptomatic of orthostatic hypotension throughout session. Ambulation x 90 ft with no AD and CGA, increase in ataxic gait with no UE support. Ambulation through obstacle course with no UE support and min A needed for weaving through cones, stepping over obstacles, and performing alt L/R cone taps. Pt left seated in recliner in room with needs in reach, quick release belt and chair alarm in place at end of session.  Therapy Documentation Precautions:  Precautions Precautions: Fall Precaution Comments: R weakness UE>LE, R inattention, posterior lean in standing, low BP Restrictions Weight Bearing Restrictions: No    Therapy/Group: Individual Therapy   Excell Seltzer, PT, DPT  11/17/2019, 12:02 PM

## 2019-11-18 DIAGNOSIS — I959 Hypotension, unspecified: Secondary | ICD-10-CM

## 2019-11-18 NOTE — Progress Notes (Signed)
[facility] PHYSICAL MEDICINE & REHABILITATION PROGRESS NOTE  Subjective/Complaints: Patient seen sitting up in bed this morning eating breakfast.  He states he slept well overnight.  ROS: Denies CP, SOB, N/V/D   Objective: Vital Signs: Blood pressure 91/68, pulse 72, temperature 98.2 F (36.8 C), resp. rate 18, height 6' (1.829 m), weight 71.1 kg, SpO2 100 %. No results found. No results for input(s): WBC, HGB, HCT, PLT in the last 72 hours. No results for input(s): NA, K, CL, CO2, GLUCOSE, BUN, CREATININE, CALCIUM in the last 72 hours.  Physical Exam: BP 91/68 (BP Location: Right Arm)   Pulse 72   Temp 98.2 F (36.8 C)   Resp 18   Ht 6' (1.829 m)   Wt 71.1 kg   SpO2 100%   BMI 21.26 kg/m  Constitutional: No distress . Vital signs reviewed. HENT: Normocephalic.  Atraumatic. Eyes: EOMI. No discharge. Cardiovascular: No JVD. Respiratory: Normal effort.  No stridor. GI: Non-distended. Skin: Warm and dry.  Intact. Psych: Slightly flat. Musc: No edema in extremities.  No tenderness in extremities. Neuro: Alert Motor: Dysarthria, unchanged Motor: RUE: 4+-5/5 proximal distal RLE: 4+-5/5 proximal distal LUE/LLE: 5/5 proximal distal    Assessment/Plan: 1. Functional deficits secondary to acute left caudate and putamen infarct which require 3+ hours per day of interdisciplinary therapy in a comprehensive inpatient rehab setting.  Physiatrist is providing close team supervision and 24 hour management of active medical problems listed below.  Physiatrist and rehab team continue to assess barriers to discharge/monitor patient progress toward functional and medical goals  Care Tool:  Bathing    Body parts bathed by patient: Right arm, Left arm, Chest, Abdomen, Front perineal area, Right upper leg, Left upper leg, Face, Left lower leg, Right lower leg, Buttocks   Body parts bathed by helper: Right lower leg, Left lower leg, Buttocks     Bathing assist Assist Level:  Supervision/Verbal cueing     Upper Body Dressing/Undressing Upper body dressing   What is the patient wearing?: Pull over shirt    Upper body assist Assist Level: Set up assist    Lower Body Dressing/Undressing Lower body dressing      What is the patient wearing?: Pants, Incontinence brief     Lower body assist Assist for lower body dressing: Minimal Assistance - Patient > 75%     Toileting Toileting    Toileting assist Assist for toileting: Contact Guard/Touching assist     Transfers Chair/bed transfer  Transfers assist     Chair/bed transfer assist level: Supervision/Verbal cueing Chair/bed transfer assistive device: Programmer, multimedia   Ambulation assist      Assist level: Supervision/Verbal cueing Assistive device: Walker-rolling Max distance: 174ft   Walk 10 feet activity   Assist     Assist level: Supervision/Verbal cueing Assistive device: Walker-rolling   Walk 50 feet activity   Assist Walk 50 feet with 2 turns activity did not occur: Safety/medical concerns (decreased activity tolerance)  Assist level: Supervision/Verbal cueing Assistive device: Walker-rolling    Walk 150 feet activity   Assist Walk 150 feet activity did not occur: Safety/medical concerns  Assist level: Supervision/Verbal cueing Assistive device: Walker-rolling    Walk 10 feet on uneven surface  activity   Assist Walk 10 feet on uneven surfaces activity did not occur: Safety/medical concerns (decreased balance/activity tolerance)   Assist level: Contact Guard/Touching assist Assistive device: Aeronautical engineer Will patient use wheelchair at discharge?: No Type of Wheelchair:  Manual    Wheelchair assist level: Supervision/Verbal cueing Max wheelchair distance: 81 ft    Wheelchair 50 feet with 2 turns activity    Assist        Assist Level: Supervision/Verbal cueing   Wheelchair 150 feet activity      Assist     Assist Level: Moderate Assistance - Patient 50 - 74%      Medical Problem List and Plan: 1.  Dysarthria, right hemiparesis with numbness, right inattention and impulsivity secondary to acute left caudate and putamen infarct with involvement in insular, fronto-parietal cortical and subcortical brain and mild swelling.   Continue CIR.  2.  Antithrombotics: -DVT/anticoagulation  Pharmaceutical: Lovenox             -antiplatelet therapy: Low dose ASA 3. Pain Management: As needed meds 4. Mood: LCSW to follow for evaluation and support.              -antipsychotic agents: N/a 5. Neuropsych: This patient is ?fully capable of making decisions on his own behalf. 6. Skin/Wound Care: Routine pressure relief measures.  7. Fluids/Electrolytes/Nutrition: Monitor I/Os.   8. Hypotension: Continue to monitor BP   ProAmatine increased to 10 mg tid on 7/11   Vitals:   11/18/19 0528 11/18/19 0531  BP: 95/68 91/68  Pulse: 58 72  Resp: 18   Temp: 98.2 F (36.8 C)   SpO2: 100% 100%   BP remain soft on 7/25, but asymptomatic  Orthostatics negative   -continue TEDs, binder 9. 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 Delton Coombes. Last infusion  06/24 and 7/15.    -compazine prn for nausea  Pt does not qualify for hospice from an onc standpoint and CVA was relatively mild  10. CKD IIIb: Baseline SCr 1.7-1.8 range per records. Will continue to monitor.              Creatinine 1.49 on 7/17, labs ordered for tomorrow 11. Polysubstance abuse: Continue to counsel on cessation.  12. Anemia of chronic disease:  Will continue to monitor.              Hemoglobin 8.8 on 7/19 13.  Hyponatremia  Sodium 134 on 7/13, labs ordered for tomorrow 14.  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

## 2019-11-19 ENCOUNTER — Inpatient Hospital Stay (HOSPITAL_COMMUNITY): Payer: Medicare Other | Admitting: Occupational Therapy

## 2019-11-19 ENCOUNTER — Inpatient Hospital Stay (HOSPITAL_COMMUNITY): Payer: Medicare Other | Admitting: Physical Therapy

## 2019-11-19 ENCOUNTER — Inpatient Hospital Stay (HOSPITAL_COMMUNITY): Payer: Medicare Other

## 2019-11-19 LAB — CBC
HCT: 30.7 % — ABNORMAL LOW (ref 39.0–52.0)
Hemoglobin: 9.8 g/dL — ABNORMAL LOW (ref 13.0–17.0)
MCH: 29.5 pg (ref 26.0–34.0)
MCHC: 31.9 g/dL (ref 30.0–36.0)
MCV: 92.5 fL (ref 80.0–100.0)
Platelets: 432 10*3/uL — ABNORMAL HIGH (ref 150–400)
RBC: 3.32 MIL/uL — ABNORMAL LOW (ref 4.22–5.81)
RDW: 17.9 % — ABNORMAL HIGH (ref 11.5–15.5)
WBC: 6.2 10*3/uL (ref 4.0–10.5)
nRBC: 0 % (ref 0.0–0.2)

## 2019-11-19 LAB — SARS CORONAVIRUS 2 (TAT 6-24 HRS): SARS Coronavirus 2: NEGATIVE

## 2019-11-19 MED ORDER — SIMETHICONE 80 MG PO CHEW: 80.0000 mg | CHEWABLE_TABLET | Freq: Four times a day (QID) | ORAL | 0 refills | Status: DC | PRN

## 2019-11-19 MED ORDER — MUSCLE RUB 10-15 % EX CREA: 1.0000 "application " | TOPICAL_CREAM | Freq: Two times a day (BID) | CUTANEOUS | 0 refills | Status: DC | PRN

## 2019-11-19 MED ORDER — MIDODRINE HCL 10 MG PO TABS: 10.0000 mg | ORAL_TABLET | Freq: Three times a day (TID) | ORAL | Status: DC

## 2019-11-19 MED ORDER — FAMOTIDINE 10 MG PO TABS: 10.0000 mg | ORAL_TABLET | Freq: Every day | ORAL | Status: DC | PRN

## 2019-11-19 NOTE — Progress Notes (Signed)
Inpatient Rehabilitation Care Coordinator  Discharge Note  The overall goal for the admission was met for:   Discharge location: [place]  Length of Stay: 11 days with discharge 11/20/19  Discharge activity level: Supervision level overall. Patient ambulated to rehab gym with RW and supervision. Participated in higher level balance activities including use of  agility ladder forwards/side stepping/ and zig zags leading with both L and R. Pt then participated in ambulation with grocery cart simulating use of laundry cart at home (pt pushes cart down hall to laundry room). Pt then participated in ambulation without AD back to mat.  Home/community participation: Acupuncturist provided included: MD, RD, PT, OT, SLP, RN, CM, Pharmacy and SW  Financial Services: Medicare  Follow-up services arranged: Other: [facility]  Comments (or additional information):  [facility]         [place] [phone]   Patient/Family verbalized understanding of follow-up arrangements: Yes   Friend; Grace Blight notified 11/19/2019  Individual responsible for coordination of the follow-up plan:  Self: [phone]  Friend: Grace Blight [phone]  Transportation arranged with [facility] @ 2pm Friend updated on discharge date and time of discharge.    Margarito Liner

## 2019-11-19 NOTE — Progress Notes (Signed)
Occupational Therapy Session Note  Patient Details  Name: Jeff [name] MRN: [id number] Date of Birth: January 02, 1946  Today's Date: 11/19/2019 OT Individual Time: 5498-2641 OT Individual Time Calculation (min): 72 min   Short Term Goals: Week 2:  OT Short Term Goal 1 (Week 2): STGs=LTGs secondary to upcoming discharge  Skilled Therapeutic Interventions/Progress Updates:    Pt greeted in the recliner with no c/o pain or dizziness. Agreeable to shower today. He ambulated with the RW into the bathroom with supervision, min vcs for device positioning before transferring to the TTB. He bathed at sit<stand level with min vcs for sequencing, vcs also for sitting to wash buttocks vs stand for BP safety. Pt then dressed sit<stand from TTB. Able to don abdominal binder and thigh high Teds with increased time and instruction. He required supervision overall. Pt then returned to the recliner to rest, still no c/o dizziness at this time. Educated pt on overall OT progress and LTG achievement. Left him with all needs within reach and safety belt fastened.   Therapy Documentation Precautions:  Precautions Precautions: Fall Precaution Comments: R weakness UE>LE, R inattention, posterior lean in standing, low BP Restrictions Weight Bearing Restrictions: No Vital Signs: Therapy Vitals Temp: 98 F (36.7 C) Pulse Rate: 61 Resp: 18 BP: 110/66 Patient Position (if appropriate): Sitting Oxygen Therapy SpO2: 100 % O2 Device: Room Air ADL: ADL Eating: Set up Where Assessed-Eating: Chair Grooming: Supervision/safety Where Assessed-Grooming: Sitting at sink Upper Body Bathing: Setup Where Assessed-Upper Body Bathing: Shower Lower Body Bathing: Supervision/safety Where Assessed-Lower Body Bathing: Shower Upper Body Dressing: Supervision/safety Where Assessed-Upper Body Dressing: Other (Comment) (sitting on TTB) Lower Body Dressing: Supervision/safety (sit<stand from TTB using RW) Where Assessed-Lower  Body Dressing: Other (Comment) (see comment above) Toileting: Supervision/safety Where Assessed-Toileting: Glass blower/designer: Close supervision (using RW) Armed forces technical officer Method: Magazine features editor: Close supervision Social research officer, government Method: Ambulating (using RW)      Therapy/Group: Individual Therapy  Carlei Huang A Olar Santini 11/19/2019, 3:47 PM

## 2019-11-19 NOTE — Progress Notes (Signed)
Physical Therapy Session Note  Patient Details  Name: Jeff [name] MRN: [id number] Date of Birth: 1945-09-01  Today's Date: 11/19/2019 PT Individual Time: 0915-1015 PT Individual Time Calculation (min): 60 min   Short Term Goals: Week 2:  PT Short Term Goal 1 (Week 2): STG=LTG due to ELOS  Skilled Therapeutic Interventions/Progress Updates: Pt presented in bed agreeable to therapy. Pt denies pain during session. BP checked in supine without TED hose or abdominal binder BP 100/65 (76) HR 54. Pt transitioned to sitting BP checked 83/70 (76) HR 102. Pt donned TED hose BP checked 94/54 (63) HR 70, due to low diastolic abdominal binder applied. Pt BP checked again 105/66 (76) HR 54. Pt then ambulated to bathroom with RW and supervision for attempted toileting (+void/-BM). Pt noted to have smear on brief but unsuccessful at [facility], pt performed peri-care with wet washcloth for cleanliness. Pt then ambulated to rehab gym with RW and supervision. Participated in higher level balance activities including use of  agility ladder forwards/side stepping/ and zig zags leading with both L and R. Pt then participated in ambulation with grocery cart simulating use of laundry cart at home (pt pushes cart down hall to laundry room). Pt then participated in ambulation without AD back to mat. PTA provided "tray" and had pt locate and place on tray then carry tray back to room. Pt was able to perform with CGA overall and no LOB. Pt returned to recliner at end of session and left with belt alarm on, call bell within reach and needs met.       Therapy Documentation Precautions:  Precautions Precautions: Fall Precaution Comments: R weakness UE>LE, R inattention, posterior lean in standing, low BP Restrictions Weight Bearing Restrictions: No General:   Vital Signs:  Pain: Pain Assessment Pain Score: 0-No pain Mobility:   Locomotion :    Trunk/Postural Assessment :    Balance:   Exercises:   Other  Treatments:      Therapy/Group: Individual Therapy  [name] 11/19/2019, 12:55 PM

## 2019-11-19 NOTE — Progress Notes (Signed)
Speech Language Pathology Daily Session Note  Patient Details  Name: Jeff [name] MRN: [id number] Date of Birth: June 22, 1945  Today's Date: 11/19/2019 SLP Individual Time: 4327-6147 SLP Individual Time Calculation (min): 40 min  Short Term Goals: Week 2: SLP Short Term Goal 1 (Week 2): Continue STG=LTG due to longer than expected ELOS  Skilled Therapeutic Interventions: Skilled ST services focused on cognitive skills. Pt expressing recall of familiar card task, pt required total A to recall 3 rules. SLP facilitated basic problem solving and recall with familiar card task, pt required max A verbal cues for basic problem solving (applying the 3 rules) and recall of 3 rules with use of visual aid fading to mod A verbal cues. Pt was left in room with call bell within reach and bed alarm set. ST recommends to continue skilled ST services.      Pain Pain Assessment Pain Scale: 0-10 Pain Score: 0-No pain  Therapy/Group: Individual Therapy  Danyiel Crespin  [facility] 11/19/2019, 9:57 AM

## 2019-11-19 NOTE — Plan of Care (Signed)
  Problem: RH Balance Goal: LTG: Patient will maintain dynamic sitting balance (OT) Description: LTG:  Patient will maintain dynamic sitting balance with assistance during activities of daily living (OT) Outcome: Completed/Met Goal: LTG Patient will maintain dynamic standing with ADLs (OT) Description: LTG:  Patient will maintain dynamic standing balance with assist during activities of daily living (OT)  Outcome: Completed/Met   Problem: Sit to Stand Goal: LTG:  Patient will perform sit to stand in prep for activites of daily living with assistance level (OT) Description: LTG:  Patient will perform sit to stand in prep for activites of daily living with assistance level (OT) Outcome: Completed/Met   Problem: RH Bathing Goal: LTG Patient will bathe all body parts with assist levels (OT) Description: LTG: Patient will bathe all body parts with assist levels (OT) Outcome: Completed/Met   Problem: RH Dressing Goal: LTG Patient will perform lower body dressing w/assist (OT) Description: LTG: Patient will perform lower body dressing with assist, with/without cues in positioning using equipment (OT) Outcome: Completed/Met   Problem: RH Toileting Goal: LTG Patient will perform toileting task (3/3 steps) with assistance level (OT) Description: LTG: Patient will perform toileting task (3/3 steps) with assistance level (OT)  Outcome: Completed/Met

## 2019-11-19 NOTE — Plan of Care (Signed)
  Problem: Consults Goal: RH STROKE PATIENT EDUCATION Description: See Patient Education module for education specifics Outcome: Progressing   Problem: RH BOWEL ELIMINATION Goal: RH STG MANAGE BOWEL WITH ASSISTANCE Description: STG Manage Bowel with Assistance. MOD I Outcome: Progressing   Problem: RH BLADDER ELIMINATION Goal: RH STG MANAGE BLADDER WITH ASSISTANCE Description: STG Manage Bladder With Assistance MOD I Outcome: Progressing   Problem: RH SAFETY Goal: RH STG ADHERE TO SAFETY PRECAUTIONS W/ASSISTANCE/DEVICE Description: STG Adhere to Safety Precautions With SUPERVISION Assistance/Device.  Outcome: Progressing   Problem: RH KNOWLEDGE DEFICIT Goal: RH STG INCREASE KNOWLEDGE OF STROKE PROPHYLAXIS Description: Patient will be able to describe measures to prevent stroke with cues/handouts Outcome: Progressing

## 2019-11-19 NOTE — Progress Notes (Signed)
[employer] PHYSICAL MEDICINE & REHABILITATION PROGRESS NOTE  Subjective/Complaints: No dizziness, discussed low BP and need to increase fluid intake   ROS: Denies CP, SOB, N/V/D   Objective: Vital Signs: Blood pressure (!) 82/58, pulse 57, temperature 98.6 F (37 C), temperature source Oral, resp. rate 16, height 6' (1.829 m), weight 71.4 kg, SpO2 100 %. No results found. Recent Labs    11/19/19 0729  WBC 6.2  HGB 9.8*  HCT 30.7*  PLT 432*   No results for input(s): NA, K, CL, CO2, GLUCOSE, BUN, CREATININE, CALCIUM in the last 72 hours.  Physical Exam: BP (!) 82/58 (BP Location: Right Arm)   Pulse 57   Temp 98.6 F (37 C) (Oral)   Resp 16   Ht 6' (1.829 m)   Wt 71.4 kg   SpO2 100%   BMI 21.35 kg/m   General: No acute distress Mood and affect are appropriate Heart: Regular rate and rhythm no rubs murmurs or extra sounds Lungs: Clear to auscultation, breathing unlabored, no rales or wheezes Abdomen: Positive bowel sounds, soft nontender to palpation, nondistended Extremities: No clubbing, cyanosis, or edema Skin: No evidence of breakdown, no evidence of rash  Motor: RUE: 4+-5/5 proximal distal RLE: 4+-5/5 proximal distal LUE/LLE: 5/5 proximal distal    Assessment/Plan: 1. Functional deficits secondary to acute left caudate and putamen infarct which require 3+ hours per day of interdisciplinary therapy in a comprehensive inpatient rehab setting.  Physiatrist is providing close team supervision and 24 hour management of active medical problems listed below.  Physiatrist and rehab team continue to assess barriers to discharge/monitor patient progress toward functional and medical goals  Care Tool:  Bathing    Body parts bathed by patient: Right arm, Left arm, Chest, Abdomen, Front perineal area, Right upper leg, Left upper leg, Face, Left lower leg, Right lower leg, Buttocks   Body parts bathed by helper: Right lower leg, Left lower leg, Buttocks     Bathing  assist Assist Level: Supervision/Verbal cueing     Upper Body Dressing/Undressing Upper body dressing   What is the patient wearing?: Pull over shirt    Upper body assist Assist Level: Set up assist    Lower Body Dressing/Undressing Lower body dressing      What is the patient wearing?: Pants, Incontinence brief     Lower body assist Assist for lower body dressing: Minimal Assistance - Patient > 75%     Toileting Toileting    Toileting assist Assist for toileting: Contact Guard/Touching assist     Transfers Chair/bed transfer  Transfers assist     Chair/bed transfer assist level: Supervision/Verbal cueing Chair/bed transfer assistive device: Programmer, multimedia   Ambulation assist      Assist level: Supervision/Verbal cueing Assistive device: Walker-rolling Max distance: 156ft   Walk 10 feet activity   Assist     Assist level: Supervision/Verbal cueing Assistive device: Walker-rolling   Walk 50 feet activity   Assist Walk 50 feet with 2 turns activity did not occur: Safety/medical concerns (decreased activity tolerance)  Assist level: Supervision/Verbal cueing Assistive device: Walker-rolling    Walk 150 feet activity   Assist Walk 150 feet activity did not occur: Safety/medical concerns  Assist level: Supervision/Verbal cueing Assistive device: Walker-rolling    Walk 10 feet on uneven surface  activity   Assist Walk 10 feet on uneven surfaces activity did not occur: Safety/medical concerns (decreased balance/activity tolerance)   Assist level: Contact Guard/Touching assist Assistive device: Chemical engineer  Assist Will patient use wheelchair at discharge?: No Type of Wheelchair: Manual    Wheelchair assist level: Supervision/Verbal cueing Max wheelchair distance: 81 ft    Wheelchair 50 feet with 2 turns activity    Assist        Assist Level: Supervision/Verbal cueing   Wheelchair  150 feet activity     Assist     Assist Level: Moderate Assistance - Patient 50 - 74%      Medical Problem List and Plan: 1.  Dysarthria, right hemiparesis with numbness, right inattention and impulsivity secondary to acute left caudate and putamen infarct with involvement in insular, fronto-parietal cortical and subcortical brain and mild swelling.   Continue CIR. PT, OT, SLP 2.  Antithrombotics: -DVT/anticoagulation  Pharmaceutical: Lovenox             -antiplatelet therapy: Low dose ASA 3. Pain Management: As needed meds 4. Mood: LCSW to follow for evaluation and support.              -antipsychotic agents: N/a 5. Neuropsych: This patient is ?fully capable of making decisions on his own behalf. 6. Skin/Wound Care: Routine pressure relief measures.  7. Fluids/Electrolytes/Nutrition: Monitor I/Os.   8. Hypotension: Continue to monitor BP   ProAmatine increased to 10 mg tid on 7/11   Vitals:   11/18/19 1921 11/19/19 0511  BP: 95/66 (!) 82/58  Pulse: 62 57  Resp: 16 16  Temp: 99.5 F (37.5 C) 98.6 F (37 C)  SpO2: 100% 165%   Low systolic this am , cont proamatine, check BMET  -continue TEDs, binder 9. 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 Delton Coombes. Last infusion  06/24 and 7/15.    -compazine prn for nausea  Pt does not qualify for hospice from an onc standpoint and CVA was relatively mild  10. CKD IIIb: Baseline SCr 1.7-1.8 range per records. Will continue to monitor.              Creatinine 1.49 on 7/17, labs ordered for tomorrow 11. Polysubstance abuse: Continue to counsel on cessation.  12. Anemia of chronic disease:  Will continue to monitor.              Hemoglobin 8.8 on 7/19 9.8 on 7/26 13.  Hyponatremia  Sodium 134 on 7/13, labs ordered for tomorrow 14.  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

## 2019-11-19 NOTE — Progress Notes (Signed)
Patient ID: Jeff [name], male   DOB: 11-07-1945, 74 y.o.   MRN: [id number] Follow up on Appeal with [facility] for SNF placement Authorization number: [id number] Reference number [id number] Initial denial overturned and SNF was approved for [facility] Notification as of 11/19/19 @ 1030  Per Chancy Hurter  Appeal Rep

## 2019-11-20 DIAGNOSIS — Z23 Encounter for immunization: Secondary | ICD-10-CM | POA: Diagnosis not present

## 2019-11-20 DIAGNOSIS — M6281 Muscle weakness (generalized): Secondary | ICD-10-CM | POA: Diagnosis not present

## 2019-11-20 DIAGNOSIS — R2681 Unsteadiness on feet: Secondary | ICD-10-CM | POA: Diagnosis not present

## 2019-11-20 DIAGNOSIS — N1832 Chronic kidney disease, stage 3b: Secondary | ICD-10-CM | POA: Diagnosis not present

## 2019-11-20 DIAGNOSIS — Z743 Need for continuous supervision: Secondary | ICD-10-CM | POA: Diagnosis not present

## 2019-11-20 DIAGNOSIS — D519 Vitamin B12 deficiency anemia, unspecified: Secondary | ICD-10-CM | POA: Diagnosis not present

## 2019-11-20 DIAGNOSIS — R279 Unspecified lack of coordination: Secondary | ICD-10-CM | POA: Diagnosis not present

## 2019-11-20 DIAGNOSIS — D649 Anemia, unspecified: Secondary | ICD-10-CM | POA: Diagnosis not present

## 2019-11-20 DIAGNOSIS — I639 Cerebral infarction, unspecified: Secondary | ICD-10-CM | POA: Diagnosis not present

## 2019-11-20 DIAGNOSIS — I5040 Unspecified combined systolic (congestive) and diastolic (congestive) heart failure: Secondary | ICD-10-CM | POA: Diagnosis not present

## 2019-11-20 DIAGNOSIS — I69328 Other speech and language deficits following cerebral infarction: Secondary | ICD-10-CM | POA: Diagnosis not present

## 2019-11-20 DIAGNOSIS — I679 Cerebrovascular disease, unspecified: Secondary | ICD-10-CM | POA: Diagnosis not present

## 2019-11-20 DIAGNOSIS — I509 Heart failure, unspecified: Secondary | ICD-10-CM | POA: Diagnosis not present

## 2019-11-20 LAB — BASIC METABOLIC PANEL
Anion gap: 11 (ref 5–15)
BUN: 29 mg/dL — ABNORMAL HIGH (ref 8–23)
CO2: 21 mmol/L — ABNORMAL LOW (ref 22–32)
Calcium: 9.3 mg/dL (ref 8.9–10.3)
Chloride: 104 mmol/L (ref 98–111)
Creatinine, Ser: 1.46 mg/dL — ABNORMAL HIGH (ref 0.61–1.24)
GFR calc Af Amer: 55 mL/min — ABNORMAL LOW (ref >60)
GFR calc non Af Amer: 47 mL/min — ABNORMAL LOW (ref >60)
Glucose, Bld: 87 mg/dL (ref 70–99)
Potassium: 4.3 mmol/L (ref 3.5–5.1)
Sodium: 136 mmol/L (ref 135–145)

## 2019-11-20 NOTE — Discharge Instructions (Signed)
Inpatient Rehab Discharge Instructions  Jeff [name] Discharge date and time: 11/20/19   Activities/Precautions/ Functional Status: Activity: activity as tolerated with supervision. NO driving. Diet: cardiac diet Wound Care: none needed    Functional status:  ___ No restrictions     ___ Walk up steps independently _X__ 24/7 supervision/assistance   ___ Walk up steps with assistance ___ Intermittent supervision/assistance  ___ Bathe/dress independently ___ Walk with walker     _X__ Bathe/dress with assistance ___ Walk Independently    ___ Shower independently _X__ Walk with assistance    ___ Shower with assistance _X__ No alcohol     ___ Return to work/school ________  Special Instructions:    My questions have been answered and I understand these instructions. I will adhere to these goals and the provided educational materials after my discharge from the hospital.  Patient/Caregiver Signature _______________________________ Date __________  Clinician Signature _______________________________________ Date __________  Please bring this form and your medication list with you to all your follow-up doctor's appointments.

## 2019-11-20 NOTE — Progress Notes (Signed)
Pt discharged to [facility] at 2550. Belongings sent with pt. Denies pain or discomfort. Pt stable at time of discharge. No further questions from pt.   Gerald Stabs, RN

## 2019-11-20 NOTE — Progress Notes (Signed)
[place] PHYSICAL MEDICINE & REHABILITATION PROGRESS NOTE  Subjective/Complaints:  No issues ovenite , aware to d/c to SNF  ROS: Denies CP, SOB, N/V/D   Objective: Vital Signs: Blood pressure 97/65, pulse 58, temperature 98.3 F (36.8 C), temperature source Oral, resp. rate 16, height 6' (1.829 m), weight 71.2 kg, SpO2 99 %. No results found. Recent Labs    11/19/19 0729  WBC 6.2  HGB 9.8*  HCT 30.7*  PLT 432*   Recent Labs    11/20/19 0516  NA 136  K 4.3  CL 104  CO2 21*  GLUCOSE 87  BUN 29*  CREATININE 1.46*  CALCIUM 9.3    Physical Exam: BP 97/65   Pulse 58   Temp 98.3 F (36.8 C) (Oral)   Resp 16   Ht 6' (1.829 m)   Wt 71.2 kg   SpO2 99%   BMI 21.29 kg/m    General: No acute distress Mood and affect are appropriate Heart: Regular rate and rhythm no rubs murmurs or extra sounds Lungs: Clear to auscultation, breathing unlabored, no rales or wheezes Abdomen: Positive bowel sounds, soft nontender to palpation, nondistended Extremities: No clubbing, cyanosis, or edema Skin: No evidence of breakdown, no evidence of rash   Motor: RUE: 4+-5/5 proximal distal RLE: 4+-5/5 proximal distal LUE/LLE: 5/5 proximal distal    Assessment/Plan: 1. Functional deficits secondary to acute left caudate and putamen infarct  Stable for D/C today F/u PCP in 3-4 weeks F/u PM&R 2 weeks See D/C summary See D/C instructionsCare Tool:  Bathing    Body parts bathed by patient: Right arm, Left arm, Chest, Abdomen, Front perineal area, Right upper leg, Left upper leg, Face, Left lower leg, Right lower leg, Buttocks   Body parts bathed by helper: Right lower leg, Left lower leg, Buttocks     Bathing assist Assist Level: Supervision/Verbal cueing     Upper Body Dressing/Undressing Upper body dressing   What is the patient wearing?: Pull over shirt, Orthosis (abdominal binder)    Upper body assist Assist Level: Supervision/Verbal cueing    Lower Body  Dressing/Undressing Lower body dressing      What is the patient wearing?: Pants, Incontinence brief     Lower body assist Assist for lower body dressing: Supervision/Verbal cueing (brief set up as underwear)     Toileting Toileting    Toileting assist Assist for toileting: Supervision/Verbal cueing (per staff report)     Transfers Chair/bed transfer  Transfers assist     Chair/bed transfer assist level: Supervision/Verbal cueing Chair/bed transfer assistive device: Programmer, multimedia   Ambulation assist      Assist level: Supervision/Verbal cueing Assistive device: Walker-rolling Max distance: 164ft   Walk 10 feet activity   Assist     Assist level: Supervision/Verbal cueing Assistive device: Walker-rolling   Walk 50 feet activity   Assist Walk 50 feet with 2 turns activity did not occur: Safety/medical concerns (decreased activity tolerance)  Assist level: Supervision/Verbal cueing Assistive device: Walker-rolling    Walk 150 feet activity   Assist Walk 150 feet activity did not occur: Safety/medical concerns  Assist level: Supervision/Verbal cueing Assistive device: Walker-rolling    Walk 10 feet on uneven surface  activity   Assist Walk 10 feet on uneven surfaces activity did not occur: Safety/medical concerns (decreased balance/activity tolerance)   Assist level: Contact Guard/Touching assist (per staff report) Assistive device: Walker-rolling   Wheelchair     Assist Will patient use wheelchair at discharge?: No Type of  Wheelchair: Agricultural engineer assist level: Supervision/Verbal cueing Max wheelchair distance: 81 ft    Wheelchair 50 feet with 2 turns activity    Assist        Assist Level: Supervision/Verbal cueing   Wheelchair 150 feet activity     Assist     Assist Level: Moderate Assistance - Patient 50 - 74%      Medical Problem List and Plan: 1.  Dysarthria, right hemiparesis  with numbness, right inattention and impulsivity secondary to acute left caudate and putamen infarct with involvement in insular, fronto-parietal cortical and subcortical brain and mild swelling.   D/C to SNF today  2.  Antithrombotics: -DVT/anticoagulation  Pharmaceutical: Lovenox             -antiplatelet therapy: Low dose ASA 3. Pain Management: As needed meds 4. Mood: LCSW to follow for evaluation and support.              -antipsychotic agents: N/a 5. Neuropsych: This patient is ?fully capable of making decisions on his own behalf. 6. Skin/Wound Care: Routine pressure relief measures.  7. Fluids/Electrolytes/Nutrition: Monitor I/Os.  repeat BMET improved , GFR mildly improved  8. Hypotension: Continue to monitor BP   ProAmatine increased to 10 mg tid on 7/11   Vitals:   11/20/19 0411 11/20/19 0553  BP: (!) 85/55 97/65  Pulse: 68 58  Resp: 16   Temp: 98.3 F (36.8 C)   SpO2: 15%    Low systolic this am , cont proamatine,   -continue TEDs, binder 9. 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 Delton Coombes. Last infusion  06/24 and 7/15.    -compazine prn for nausea  Pt does not qualify for hospice from an onc standpoint and CVA was relatively mild  10. CKD IIIb: Baseline SCr 1.7-1.8 range per records. Will continue to monitor.              Creatinine 1.49 on 7/17, labs ordered for tomorrow 11. Polysubstance abuse: Continue to counsel on cessation.  12. Anemia of chronic disease:  Will continue to monitor.              Hemoglobin 8.8 on 7/19 9.8 on 7/26 13.  Hyponatremia  Sodium 134 on 7/13, labs ordered for tomorrow 14.  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

## 2019-11-27 DIAGNOSIS — I679 Cerebrovascular disease, unspecified: Secondary | ICD-10-CM | POA: Diagnosis not present

## 2019-11-29 DIAGNOSIS — D649 Anemia, unspecified: Secondary | ICD-10-CM | POA: Diagnosis not present

## 2019-11-29 DIAGNOSIS — I509 Heart failure, unspecified: Secondary | ICD-10-CM | POA: Diagnosis not present

## 2019-11-29 DIAGNOSIS — I679 Cerebrovascular disease, unspecified: Secondary | ICD-10-CM | POA: Diagnosis not present

## 2019-11-30 DIAGNOSIS — D649 Anemia, unspecified: Secondary | ICD-10-CM | POA: Diagnosis not present

## 2019-11-30 DIAGNOSIS — I509 Heart failure, unspecified: Secondary | ICD-10-CM | POA: Diagnosis not present

## 2019-12-04 ENCOUNTER — Encounter: Payer: Medicare Other | Attending: Physical Medicine & Rehabilitation | Admitting: Physical Medicine & Rehabilitation

## 2019-12-17 ENCOUNTER — Inpatient Hospital Stay (HOSPITAL_COMMUNITY): Payer: Medicare Other | Attending: Hematology

## 2019-12-17 ENCOUNTER — Other Ambulatory Visit: Payer: Self-pay

## 2019-12-17 ENCOUNTER — Inpatient Hospital Stay (HOSPITAL_COMMUNITY): Payer: Medicare Other

## 2019-12-17 ENCOUNTER — Inpatient Hospital Stay (HOSPITAL_BASED_OUTPATIENT_CLINIC_OR_DEPARTMENT_OTHER): Payer: Medicare Other | Admitting: Hematology

## 2019-12-17 VITALS — BP 114/72 | HR 59 | Temp 97.6°F | Resp 16

## 2019-12-17 VITALS — BP 117/85 | HR 68 | Temp 97.6°F | Resp 16 | Wt 157.2 lb

## 2019-12-17 DIAGNOSIS — Z5111 Encounter for antineoplastic chemotherapy: Secondary | ICD-10-CM | POA: Diagnosis not present

## 2019-12-17 DIAGNOSIS — C61 Malignant neoplasm of prostate: Secondary | ICD-10-CM

## 2019-12-17 DIAGNOSIS — C7951 Secondary malignant neoplasm of bone: Secondary | ICD-10-CM | POA: Diagnosis not present

## 2019-12-17 DIAGNOSIS — N189 Chronic kidney disease, unspecified: Secondary | ICD-10-CM | POA: Diagnosis not present

## 2019-12-17 DIAGNOSIS — E538 Deficiency of other specified B group vitamins: Secondary | ICD-10-CM

## 2019-12-17 DIAGNOSIS — D631 Anemia in chronic kidney disease: Secondary | ICD-10-CM | POA: Diagnosis not present

## 2019-12-17 DIAGNOSIS — M899 Disorder of bone, unspecified: Secondary | ICD-10-CM

## 2019-12-17 DIAGNOSIS — Z5189 Encounter for other specified aftercare: Secondary | ICD-10-CM | POA: Diagnosis not present

## 2019-12-17 DIAGNOSIS — E559 Vitamin D deficiency, unspecified: Secondary | ICD-10-CM | POA: Diagnosis not present

## 2019-12-17 LAB — CBC WITH DIFFERENTIAL/PLATELET
Abs Immature Granulocytes: 0.01 10*3/uL (ref 0.00–0.07)
Basophils Absolute: 0 10*3/uL (ref 0.0–0.1)
Basophils Relative: 0 %
Eosinophils Absolute: 0 10*3/uL (ref 0.0–0.5)
Eosinophils Relative: 1 %
HCT: 33.8 % — ABNORMAL LOW (ref 39.0–52.0)
Hemoglobin: 10.5 g/dL — ABNORMAL LOW (ref 13.0–17.0)
Immature Granulocytes: 0 %
Lymphocytes Relative: 37 %
Lymphs Abs: 1.9 10*3/uL (ref 0.7–4.0)
MCH: 28.9 pg (ref 26.0–34.0)
MCHC: 31.1 g/dL (ref 30.0–36.0)
MCV: 93.1 fL (ref 80.0–100.0)
Monocytes Absolute: 0.6 10*3/uL (ref 0.1–1.0)
Monocytes Relative: 11 %
Neutro Abs: 2.6 10*3/uL (ref 1.7–7.7)
Neutrophils Relative %: 51 %
Platelets: 310 10*3/uL (ref 150–400)
RBC: 3.63 MIL/uL — ABNORMAL LOW (ref 4.22–5.81)
RDW: 18 % — ABNORMAL HIGH (ref 11.5–15.5)
WBC: 5.2 10*3/uL (ref 4.0–10.5)
nRBC: 0 % (ref 0.0–0.2)

## 2019-12-17 LAB — COMPREHENSIVE METABOLIC PANEL
ALT: 12 U/L (ref 0–44)
AST: 18 U/L (ref 15–41)
Albumin: 3.9 g/dL (ref 3.5–5.0)
Alkaline Phosphatase: 59 U/L (ref 38–126)
Anion gap: 11 (ref 5–15)
BUN: 22 mg/dL (ref 8–23)
CO2: 24 mmol/L (ref 22–32)
Calcium: 9.1 mg/dL (ref 8.9–10.3)
Chloride: 101 mmol/L (ref 98–111)
Creatinine, Ser: 1.55 mg/dL — ABNORMAL HIGH (ref 0.61–1.24)
GFR calc Af Amer: 51 mL/min — ABNORMAL LOW (ref >60)
GFR calc non Af Amer: 44 mL/min — ABNORMAL LOW (ref >60)
Glucose, Bld: 104 mg/dL — ABNORMAL HIGH (ref 70–99)
Potassium: 3.7 mmol/L (ref 3.5–5.1)
Sodium: 136 mmol/L (ref 135–145)
Total Bilirubin: 0.6 mg/dL (ref 0.3–1.2)
Total Protein: 8.6 g/dL — ABNORMAL HIGH (ref 6.5–8.1)

## 2019-12-17 LAB — MAGNESIUM: Magnesium: 1.9 mg/dL (ref 1.7–2.4)

## 2019-12-17 LAB — PSA: Prostatic Specific Antigen: 1.79 ng/mL (ref 0.00–4.00)

## 2019-12-17 MED ORDER — CYANOCOBALAMIN 1000 MCG/ML IJ SOLN
1000.0000 ug | Freq: Once | INTRAMUSCULAR | Status: AC
  Administered 2019-12-17: 1000 ug via INTRAMUSCULAR
  Filled 2019-12-17: qty 1

## 2019-12-17 MED ORDER — SODIUM CHLORIDE 0.9 % IV SOLN
20.0000 mg/m2 | Freq: Once | INTRAVENOUS | Status: AC
  Administered 2019-12-17: 41 mg via INTRAVENOUS
  Filled 2019-12-17: qty 4.1

## 2019-12-17 MED ORDER — HEPARIN SOD (PORK) LOCK FLUSH 100 UNIT/ML IV SOLN
500.0000 [IU] | Freq: Once | INTRAVENOUS | Status: AC | PRN
  Administered 2019-12-17: 500 [IU]

## 2019-12-17 MED ORDER — SODIUM CHLORIDE 0.9 % IV SOLN: Freq: Once | INTRAVENOUS | Status: AC

## 2019-12-17 MED ORDER — FAMOTIDINE IN NACL 20-0.9 MG/50ML-% IV SOLN
20.0000 mg | Freq: Once | INTRAVENOUS | Status: AC
  Administered 2019-12-17: 20 mg via INTRAVENOUS
  Filled 2019-12-17: qty 50

## 2019-12-17 MED ORDER — SODIUM CHLORIDE 0.9% FLUSH
10.0000 mL | INTRAVENOUS | Status: DC | PRN
  Administered 2019-12-17: 10 mL

## 2019-12-17 MED ORDER — PEGFILGRASTIM 6 MG/0.6ML ~~LOC~~ PSKT
6.0000 mg | PREFILLED_SYRINGE | Freq: Once | SUBCUTANEOUS | Status: AC
  Administered 2019-12-17: 6 mg via SUBCUTANEOUS
  Filled 2019-12-17: qty 0.6

## 2019-12-17 MED ORDER — SODIUM CHLORIDE 0.9 % IV SOLN
10.0000 mg | Freq: Once | INTRAVENOUS | Status: AC
  Administered 2019-12-17: 10 mg via INTRAVENOUS
  Filled 2019-12-17: qty 10

## 2019-12-17 MED ORDER — DIPHENHYDRAMINE HCL 50 MG/ML IJ SOLN
25.0000 mg | Freq: Once | INTRAMUSCULAR | Status: AC
  Administered 2019-12-17: 25 mg via INTRAVENOUS
  Filled 2019-12-17: qty 1

## 2019-12-17 NOTE — Patient Instructions (Signed)
[facility] Discharge Instructions  You were seen today by Dr. Katragadda. He went over your recent results. You received your treatment today. Dr. Katragadda will see you back in 3 weeks for labs and follow up.   Thank you for choosing [facility] at [facility] to provide your oncology and hematology care.  To afford each patient quality time with our provider, please arrive at least 15 minutes before your scheduled appointment time.   If you have a lab appointment with the Cancer Center please come in thru the Main Entrance and check in at the main information desk  You need to re-schedule your appointment should you arrive 10 or more minutes late.  We strive to give you quality time with our providers, and arriving late affects you and other patients whose appointments are after yours.  Also, if you no show three or more times for appointments you may be dismissed from the clinic at the providers discretion.     Again, thank you for choosing [facility].  Our hope is that these requests will decrease the amount of time that you wait before being seen by our physicians.       _____________________________________________________________  Should you have questions after your visit to [facility], please contact our office at [phone] between the hours of 8:00 a.m. and 4:30 p.m.  Voicemails left after 4:00 p.m. will not be returned until the following business day.  For prescription refill requests, have your pharmacy contact our office and allow 72 hours.    Cancer Center Support Programs:   > Cancer Support Group  2nd Tuesday of the month 1pm-2pm, Journey Room    

## 2019-12-17 NOTE — Progress Notes (Signed)
Patient tolerated Jevtava therapy with no complaints voiced. Possible side effects with management reviewed with understanding verbalized. Port cite clean and dry with no bruising or swelling noted at site. Good blood return noted before and after administration of therapy. Band aid applied. Patient left in satisfactory condition with VSS and no s/s of distress noted. Neulasta On-Pro applied to left arm and green indicator light flashing. Patient to follow up as instructed.

## 2019-12-17 NOTE — Progress Notes (Signed)
[name],  [id number]   CLINIC:  Medical Oncology/Hematology  PCP:  Holley Bouche, NP (Inactive) [address] / [place] [address] [phone]   REASON FOR VISIT:  Follow-up for prostate cancer  PRIOR THERAPY: None  NGS Results: Not done  CURRENT THERAPY: Cabazitaxel every 3 weeks  BRIEF ONCOLOGIC HISTORY:  Oncology History  Malignant neoplasm of prostate ([name])  01/26/2014 Tumor Marker   PSA > 5000   01/28/2014 Initial Biopsy   Metastatic adenocarcinoma of prostate   02/05/2014 Surgery   Bilateral orchiectomy by Dr. Junious Silk   02/26/2014 Tumor Marker   PSA = 399.5   03/14/2014 Tumor Marker   PSA= 89.51   03/14/2014 - 06/26/2014 Chemotherapy   Docetaxel 75 mg/kg every 21 days x 6 cycles   04/24/2014 Tumor Marker   PSA- 19.89   07/23/2014 Procedure   Nephrostomy tube removed by IR, Dr. Geroge Baseman   07/24/2014 Tumor Marker   PSA= 6.15   10/16/2014 Tumor Marker   PSA: 3.54    01/08/2015 Tumor Marker   PSA: 2.13    01/17/2015 Imaging   CT CAP-  Massive pelvic and retroperitoneal lymphadenopathy noted on the prior study has nearly completely resolved, now with only a small amount of residual amorphous soft tissue predominantly around the the infrarenal abdominal aorta.   01/17/2015 Imaging   Bone scan- Widespread osseous metastatic disease with multiple foci of increased activity throughout the skeleton, corresponding with the findings on the prior CT from October, 2015.   04/11/2015 Tumor Marker   PSA: 1.87    07/21/2015 Imaging   Bone scan- Bony metastatic disease again noted at multiple sites, stable from prior study. No progression of bony metastatic disease is demonstrable on this study.   07/25/2015 Imaging   CT CAP- Stable matted soft tissue density in the retroperitoneum and extraperitoneal pelvis consistent with treated disease. No recurrent lymphadenopathy.  No pulmonary metastatic disease.  Diffuse stable sclerotic metastatic bone disease.   01/14/2016 Imaging   Bone scan-  Multiple sites of abnormal increased tracer localization involving BILATERAL ribs, T11, T12, questionably RIGHT scapula and LEFT iliac bone suspicious for osseous metastases.   01/23/2016 Imaging   CT CAP- 1. Similar widespread osseous metastasis. 2. Similar soft tissue thickening within the retroperitoneum of the abdomen. Improved left pelvic side wall soft tissue thickening. These are consistent with sites of treated disease. No well-defined adenopathy. 3. No new sites of disease.   05/03/2016 -  Chemotherapy   Jevtana every 21 days    05/03/2016 -  Chemotherapy   The patient had pegfilgrastim (NEULASTA) injection 6 mg, 6 mg, Subcutaneous,  Once, 1 of 1 cycle Administration: 6 mg (05/05/2016) pegfilgrastim (NEULASTA ONPRO KIT) injection 6 mg, 6 mg, Subcutaneous, Once, 54 of 59 cycles Administration: 6 mg (05/24/2016), 6 mg (06/14/2016), 6 mg (07/07/2016), 6 mg (07/28/2016), 6 mg (08/18/2016), 6 mg (09/08/2016), 6 mg (09/29/2016), 6 mg (10/20/2016), 6 mg (11/10/2016), 6 mg (02/14/2017), 6 mg (03/07/2017), 6 mg (03/28/2017), 6 mg (04/20/2017), 6 mg (05/11/2017), 6 mg (06/27/2017), 6 mg (08/30/2017), 6 mg (09/20/2017), 6 mg (10/11/2017), 6 mg (11/02/2017), 6 mg (08/09/2017), 6 mg (11/23/2017), 6 mg (12/14/2017), 6 mg (01/09/2018), 6 mg (01/31/2018), 6 mg (04/04/2018), 6 mg (04/27/2018), 6 mg (05/18/2018), 6 mg (06/08/2018), 6 mg (06/29/2018), 6 mg (07/20/2018), 6 mg (08/10/2018), 6 mg (09/01/2018), 6 mg (09/22/2018), 6 mg (10/13/2018), 6 mg (11/03/2018), 6 mg (11/24/2018), 6 mg (12/15/2018), 6 mg (01/05/2019), 6 mg (01/26/2019),  6 mg (02/16/2019), 6 mg (03/12/2019), 6 mg (04/02/2019), 6 mg (04/23/2019), 6 mg (05/14/2019), 6 mg (06/04/2019), 6 mg (06/25/2019), 6 mg (07/16/2019), 6 mg (08/16/2019), 6 mg (09/06/2019), 6 mg (09/27/2019), 6 mg (10/18/2019) cabazitaxel (JEVTANA) 41 mg in dextrose 5 % 250 mL chemo infusion, 20 mg/m2 = 41 mg (100 % of original dose 20 mg/m2),  Intravenous,  Once, 55 of 60 cycles Dose modification: 20 mg/m2 (original dose 20 mg/m2, Cycle 1, Reason: Dose not tolerated, Comment: recommended by Data to use 77m/m), 20 mg/m2 (original dose 20 mg/m2, Cycle 37, Reason: Other (see comments), Comment: Changed to new orderable), 21 mg/m2 (original dose 20 mg/m2, Cycle 40, Reason: Other (see comments)), 20 mg/m2 (original dose 20 mg/m2, Cycle 40, Reason: Other (see comments)) Administration: 41 mg (05/03/2016), 41 mg (05/24/2016), 41 mg (06/14/2016), 41 mg (07/07/2016), 41 mg (07/28/2016), 41 mg (08/18/2016), 41 mg (09/08/2016), 41 mg (09/29/2016), 41 mg (10/20/2016), 41 mg (11/10/2016), 41 mg (02/14/2017), 41 mg (03/07/2017), 41 mg (03/28/2017), 41 mg (04/20/2017), 41 mg (05/11/2017), 41 mg (06/27/2017), 41 mg (08/30/2017), 41 mg (09/20/2017), 41 mg (10/11/2017), 41 mg (11/02/2017), 41 mg (08/09/2017), 41 mg (11/23/2017), 41 mg (12/14/2017), 41 mg (01/09/2018), 41 mg (01/31/2018), 41 mg (02/21/2018), 41 mg (04/04/2018), 41 mg (04/27/2018), 41 mg (05/18/2018), 41 mg (06/08/2018), 41 mg (06/29/2018), 41 mg (07/20/2018), 41 mg (08/10/2018), 41 mg (09/01/2018), 41 mg (09/22/2018), 41 mg (10/13/2018), 41 mg (11/03/2018), 41 mg (11/24/2018), 41 mg (12/15/2018), 41 mg (01/05/2019), 41 mg (01/26/2019), 41 mg (02/16/2019), 41 mg (03/12/2019), 41 mg (04/02/2019), 41 mg (04/23/2019), 41 mg (05/14/2019), 41 mg (06/04/2019), 41 mg (06/25/2019), 41 mg (07/16/2019), 41 mg (08/16/2019), 41 mg (09/06/2019), 41 mg (09/27/2019), 41 mg (10/18/2019)  for chemotherapy treatment.    09/06/2016 Imaging   Restaging CT C/A/P: IMPRESSION: 1. Similar appearance of widespread sclerotic bone metastases. 2. No change in soft tissue thickening within the retroperitoneum and left pelvic sidewall. No well defined adenopathy identified. 3. No new sites of disease 4. Aortic Atherosclerosis (ICD10-I70.0). Coronary artery calcifications noted. 5. 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 6. Stable 9 mm pancreatic cystic lesion. Favor pseudocyst. Indolent neoplasm may look similar. Attention on follow-up imaging.   09/06/2016 Imaging   Bone Scan: IMPRESSION: In this patient with known diffuse sclerotic metastatic disease, bone scan does not reveal progression of radiotracer uptake. Several of the areas of previously demonstrated radiotracer uptake appear less prominent.  Change in appearance of radiotracer uptake involving the mandible now more notable on the right and previously more notable on the left may reflect result of dental disease rather than metastatic disease.    04/08/2017 Tumor Marker   PSA 0.89     CANCER STAGING: Cancer Staging No matching staging information was found for the patient.  INTERVAL HISTORY:  [name] a 74y.o. male, returns for routine follow-up and consideration for next cycle of chemotherapy. DAldairwas last seen on 09/27/2019.  Due for cycle #55 of cabazitaxel today.   He had a stroke [place] [facility] and right [facility] droop[facility]; he was discharged on 7/27. He reports doing PT rehab at home 3 times per week. He denies hematochezia or hematuria. His swallowing is okay and his appetite is good; he denies N/V or numbness.  Overall, he feels ready for next cycle of chemo today.    REVIEW OF SYSTEMS:  Review of Systems  Constitutional: Positive for appetite change (mildly decreased). Negative for fatigue.  HENT:   Negative for trouble swallowing.   Cardiovascular: Negative for leg swelling.  Gastrointestinal: Negative for blood in stool, nausea and vomiting.  Genitourinary: Negative for hematuria.   Neurological: Negative for numbness.  All other systems reviewed and are negative.   PAST MEDICAL/SURGICAL HISTORY:  Past Medical History:  Diagnosis Date   Acute renal failure ([place]) 01/26/2014   Alcohol abuse    Anemia of chronic disease 01/26/2014   Bilateral  hydronephrosis 01/26/2014   History of cocaine abuse ([place]) 01/26/2014   Homelessness 01/31/2014   Prostate cancer metastatic to multiple sites ([place]) 01/30/2014   Sepsis ([place])    UTI (lower urinary tract infection)    Past Surgical History:  Procedure Laterality Date   ORCHIECTOMY Bilateral 02/05/2014   Procedure: BILATERAL ORCHIECTOMY;  Surgeon: Festus Aloe, MD;  Location: [facility];  Service: Urology;  Laterality: Bilateral;   PERCUTANEOUS NEPHROSTOMY Bilateral    IR Dr. Junious Silk changed on 04/22/2014   PORT-A-CATH REMOVAL Right 08/08/2017   Procedure: REMOVAL PORT-A-CATH RIGHT CHEST (PROCEDURE #2);  Surgeon: Aviva Signs, MD;  Location: [facility];  Service: General;  Laterality: Right;   PORTACATH PLACEMENT Right 03/12/14   PORTACATH PLACEMENT Left 08/08/2017   Procedure: INSERTION POWER PORT WITH ATTACHED CATHETER LEFT SUBCLAVIAN (PROCEDURE #1);  Surgeon: Aviva Signs, MD;  Location: [facility];  Service: General;  Laterality: Left;    SOCIAL HISTORY:  Social History   Socioeconomic History   Marital status: Single    Spouse name: Not on file   Number of children: 1   Years of education: 9th   Highest education level: Not on file  Occupational History   Occupation: disabilty    Employer: [employer]  Tobacco Use   Smoking status: Current Every Day Smoker    Packs/day: 0.25    Years: 55.00    Pack years: 13.75    Types: Cigarettes   Smokeless tobacco: Never Used  Vaping Use   Vaping Use: Never used  Substance and Sexual Activity   Alcohol use: Yes    Alcohol/week: 1.0 standard drink    Types: 1 Cans of beer per week   Drug use: Yes    Types: Cocaine    Comment: last used Cocaine 01/25/14   Sexual activity: Yes  Other Topics Concern   Not on file  Social History Narrative   Lives at [employer] 9th grade   1 son, lives in [place], [place]   Can read and write in native language            Social Determinants of Health    Financial Resource Strain:    Difficulty of Paying Living Expenses: Not on file  Food Insecurity:    Worried About Charity fundraiser in the Last Year: Not on file   [employer] of Food in the Last Year: Not on file  Transportation Needs:    Lack of Transportation (Medical): Not on file   Lack of Transportation (Non-Medical): Not on file  Physical Activity:    Days of Exercise per Week: Not on file   Minutes of Exercise per Session: Not on file  Stress:    Feeling of Stress : Not on file  Social Connections:    Frequency of Communication with Friends and Family: Not on file   Frequency of Social Gatherings with Friends and Family: Not on file   Attends Religious Services: Not on file   Active Member of Clubs or Organizations: Not on file  Attends Music therapist: Not on file   Marital Status: Not on file  Intimate Partner Violence:    Fear of Current or Ex-Partner: Not on file   Emotionally Abused: Not on file   Physically Abused: Not on file   Sexually Abused: Not on file    FAMILY HISTORY:  Family History  Problem Relation Age of Onset   Cancer Brother 32    CURRENT MEDICATIONS:  Current Outpatient Medications  Medication Sig Dispense Refill   aspirin EC 81 MG EC tablet Take 1 tablet (81 mg total) by mouth daily. Swallow whole. 30 tablet 11   atorvastatin (LIPITOR) 40 MG tablet Take 1 tablet (40 mg total) by mouth daily.     famotidine (PEPCID) 10 MG tablet Take 1 tablet (10 mg total) by mouth daily as needed for heartburn or indigestion.     Menthol-Methyl Salicylate (MUSCLE RUB) 10-15 % CREA Apply 1 application topically 2 (two) times daily as needed for muscle pain. To back and hips  0   midodrine (PROAMATINE) 10 MG tablet Take 1 tablet (10 mg total) by mouth 3 (three) times daily with meals.     simethicone (MYLICON) 80 MG chewable tablet Chew 1 tablet (80 mg total) by mouth 4 (four) times daily as needed for flatulence. 30  tablet 0   No current facility-administered medications for this visit.   Facility-Administered Medications Ordered in Other Visits  Medication Dose Route Frequency Provider Last Rate Last Admin   cabazitaxel (JEVTANA) 41 mg in sodium chloride 0.9 % 250 mL chemo infusion  20 mg/m2 (Order-Specific) Intravenous Once Derek Jack, MD       cyanocobalamin ((VITAMIN B-12)) injection 1,000 mcg  1,000 mcg Intramuscular Once Kefalas, Thomas S, PA-C       cyanocobalamin ((VITAMIN B-12)) injection 1,000 mcg  1,000 mcg Intramuscular Once Lockamy, Randi L, NP-C       dexamethasone (DECADRON) 10 mg in sodium chloride 0.9 % 50 mL IVPB  10 mg Intravenous Once Derek Jack, MD       diphenhydrAMINE (BENADRYL) injection 25 mg  25 mg Intravenous Once Derek Jack, MD       famotidine (PEPCID) IVPB 20 mg premix  20 mg Intravenous Once Derek Jack, MD       heparin lock flush 100 unit/mL  500 Units Intracatheter Once PRN Derek Jack, MD       pegfilgrastim (NEULASTA ONPRO KIT) injection 6 mg  6 mg Subcutaneous Once Derek Jack, MD       sodium chloride flush (NS) 0.9 % injection 10 mL  10 mL Intracatheter PRN Derek Jack, MD   10 mL at 12/17/19 0931    ALLERGIES:  Allergies  Allergen Reactions   Chlorhexidine     PHYSICAL EXAM:  Performance status (ECOG): 1 - Symptomatic but completely ambulatory  Vitals:   12/17/19 0935  BP: 117/85  Pulse: 68  Resp: 16  Temp: 97.6 F (36.4 C)  SpO2: 100%   Wt Readings from Last 3 Encounters:  12/17/19 157 lb 3.2 oz (71.3 kg)  11/20/19 156 lb 15.5 oz (71.2 kg)  10/25/19 168 lb (76.2 kg)   Physical Exam Vitals reviewed.  Constitutional:      Appearance: Normal appearance.  Cardiovascular:     Rate and Rhythm: Normal rate and regular rhythm.     Pulses: Normal pulses.     Heart sounds: Normal heart sounds.  Pulmonary:     Effort: Pulmonary effort is normal.     Breath  sounds: Normal  breath sounds.  Chest:     Comments: Port-a-Cath in L chest Abdominal:     Palpations: Abdomen is soft. There is no mass.     Tenderness: There is no abdominal tenderness.  Musculoskeletal:     Right lower leg: No edema.     Left lower leg: No edema.  Neurological:     General: No focal deficit present.     Mental Status: He is alert and oriented to person, place, and time.     Cranial Nerves: Facial asymmetry (R facial droopiness) present.     Motor: Weakness (R-sided weakness) present.  Psychiatric:        Mood and Affect: Mood normal.        Behavior: Behavior normal.     LABORATORY DATA:  I have reviewed the labs as listed.  CBC Latest Ref Rng & Units 12/17/2019 11/19/2019 11/12/2019  WBC 4.0 - 10.5 K/uL 5.2 6.2 8.4  Hemoglobin 13.0 - 17.0 g/dL 10.5(L) 9.8(L) 8.8(L)  Hematocrit 39 - 52 % 33.8(L) 30.7(L) 28.2(L)  Platelets 150 - 400 K/uL 310 432(H) 441(H)   CMP Latest Ref Rng & Units 12/17/2019 11/20/2019 11/10/2019  Glucose 70 - 99 mg/dL 104(H) 87 -  BUN 8 - 23 mg/dL 22 29(H) -  Creatinine 0.61 - 1.24 mg/dL 1.55(H) 1.46(H) 1.49(H)  Sodium 135 - 145 mmol/L 136 136 -  Potassium 3.5 - 5.1 mmol/L 3.7 4.3 -  Chloride 98 - 111 mmol/L 101 104 -  CO2 22 - 32 mmol/L 24 21(L) -  Calcium 8.9 - 10.3 mg/dL 9.1 9.3 -  Total Protein 6.5 - 8.1 g/dL 8.6(H) - -  Total Bilirubin 0.3 - 1.2 mg/dL 0.6 - -  Alkaline Phos 38 - 126 U/L 59 - -  AST 15 - 41 U/L 18 - -  ALT 0 - 44 U/L 12 - -   Lab Results  Component Value Date   PSA 1.18 09/06/2016   PSA 2.61 07/12/2016   PSA 2.86 07/07/2016    DIAGNOSTIC IMAGING:  I have independently reviewed the scans and discussed with the patient. No results found.   ASSESSMENT:  1. 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 -Baseline PSA between 0.7 and 1. -Recent up and downs in the PSA up to a maximum of 1.29. -Bone scan on 08/03/2019 showed uptake in the right mandible, slightly  more progressive than prior study in April 2019.  Multiple additional studies in the ribs and pelvis are not identified.  Uptake in the left maxilla, question dental disease. -CT CAP on 08/03/2019 did not show any significant change from August 2018 CT study.  No new or progressive disease.  Stable extensive sclerotic bone lesions throughout the axial and proximal appendicular skeleton.  Chronic pathological mid T3 vertebral fracture.  No pathologically enlarged abdominal lymph nodes.  2.  Left-sided stroke with right hemiparesis: -Hospitalized from 10/25/2019 through 11/20/2019 with right-sided weakness, right facial droop, MRI/MRA revealing acute left caudate/putamen infarct.  MRA showed occlusion of the superior division of the left MCA M2 branch.  Urine cocaine was positive. -He underwent rehab and most of his strength was regained.   PLAN:  1. 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 -Recently hospitalized with CVA and improving functional status. -Muscle strength on the right side improved to 4/5.  He is doing PT/OT at home 3 times a week. -Reviewed his labs.  LFTs  are normal.  CBC shows normal white count and platelet count. -PSA increased to 1.79 today from 1.49 in June. -He will proceed with his next cycle today.  I will see him back in 3 weeks for follow-up.  2. Bone metastasis: -Continue denosumab monthly.  3. Renal insufficiency: -Creatinine is better at 1.55 today.  4. 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  Hemoglobin today is 10.5.  5. Vitamin D deficiency: -Continue vitamin D supplements at home.  6.  CVA: -Continue aspirin 81 mg, Lipitor 40 mg.   Orders placed this encounter:  No orders of the defined types were placed in this encounter.    Derek Jack, MD [place] [phone]   I, Milinda Antis, am acting as a scribe for Dr. Sanda Linger.  I, Derek Jack MD, have reviewed the above documentation for accuracy and completeness, and I agree with the above.

## 2019-12-17 NOTE — Progress Notes (Signed)
Patient has been assessed by Dr. Delton Coombes and labs reviewed.  He is okay to proceed with his treatment today.  Primary RN and pharmacy aware.

## 2019-12-17 NOTE — Patient Instructions (Signed)
Cabazitaxel injection What is this medicine? CABAZITAXEL (ka baz i TAX el) is a chemotherapy drug. It is used to treat prostate cancer. It targets fast dividing cells, like cancer cells, and causes these cells to die. This medicine may be used for other purposes; ask your health care provider or pharmacist if you have questions. COMMON BRAND NAME(S): Jevtana What should I tell my health care provider before I take this medicine? They need to know if you have any of these conditions:  history of stomach bleeding  kidney disease  liver disease  low blood counts, like low white cell, platelet, or red cell counts  lung or breathing disease, like asthma  recent or ongoing radiation therapy  take medicines that treat or prevent blood clots  an unusual or allergic reaction to cabazitaxel, polysorbate 80, other medicines, foods, dyes, or preservatives  pregnant or trying to get pregnant  breast-feeding How should I use this medicine? This medicine is for infusion into a vein. It is given by a health care professional in a hospital or clinic setting. Talk to your pediatrician regarding the use of this medicine in children. Special care may be needed. Overdosage: If you think you have taken too much of this medicine contact a poison control center or emergency room at once. NOTE: This medicine is only for you. Do not share this medicine with others. What if I miss a dose? It is important not to miss your dose. Call your doctor or health care professional if you are unable to keep an appointment. What may interact with this medicine?  antiviral medicines for HIV or AIDS  clarithromycin  medicines for fungal infections like ketoconazole, fluconazole, itraconazole, and voriconazole  nefazodone  telithromycin This list may not describe all possible interactions. Give your health care provider a list of all the medicines, herbs, non-prescription drugs, or dietary supplements you use.  Also tell them if you smoke, drink alcohol, or use illegal drugs. Some items may interact with your medicine. What should I watch for while using this medicine? Your condition will be monitored carefully while you are receiving this medicine. This drug may make you feel generally unwell. This is not uncommon, as chemotherapy can affect healthy cells as well as cancer cells. Report any side effects. Continue your course of treatment even though you feel ill unless your doctor tells you to stop. Call your doctor or health care professional for advice if you get a fever, chills or sore throat, or other symptoms of a cold or flu. Do not treat yourself. This drug decreases your body's ability to fight infections. Try to avoid being around people who are sick. This medicine may increase your risk to bruise or bleed. Call your doctor or health care professional if you notice any unusual bleeding. Be careful brushing and flossing your teeth or using a toothpick because you may get an infection or bleed more easily. If you have any dental work done, tell your dentist you are receiving this medicine. Avoid taking products that contain aspirin, acetaminophen, ibuprofen, naproxen, or ketoprofen unless instructed by your doctor. These medicines may hide a fever. Do not become pregnant while taking this medicine. Women should inform their doctor if they wish to become pregnant or think they might be pregnant. Men should not father a child while taking this medicine and for 3 months after stopping it. There is a potential for serious side effects to an unborn child. Talk to your health care professional or pharmacist for more   information. Do not breast-feed an infant while taking this medicine. What side effects may I notice from receiving this medicine? Side effects that you should report to your doctor or health care professional as soon as possible:  allergic reactions like skin rash, itching or hives, swelling of  the face, lips, or tongue  blood in the urine  breathing problems  constipation  dark urine  diarrhea  pain in the lower back or side  pain, tingling, numbness in the hands or feet  pain when urinating  severe abdominal pain  signs of infection - fever or chills, cough, sore throat, pain or difficulty passing urine  signs and symptoms of kidney injury like trouble passing urine or change in the amount of urine  signs of decreased platelets or bleeding - bruising, pinpoint red spots on the skin, black, tarry stools, blood in the urine  signs of decreased red blood cells - unusually weak or tired, fainting spells, lightheadedness  vomiting Side effects that usually do not require medical attention (report to your doctor or health care professional if they continue or are bothersome):  back pain  change in taste  hair loss  headache  loss of appetite  muscle or joint pain  nausea  upset stomach This list may not describe all possible side effects. Call your doctor for medical advice about side effects. You may report side effects to FDA at 1-800-FDA-1088. Where should I keep my medicine? This drug is given in a hospital or clinic and will not be stored at home. NOTE: This sheet is a summary. It may not cover all possible information. If you have questions about this medicine, talk to your doctor, pharmacist, or health care provider.  2020 Elsevier/Gold Standard (2016-05-07 15:38:47)  

## 2019-12-20 ENCOUNTER — Ambulatory Visit: Payer: Medicare Other | Admitting: Neurology

## 2020-01-01 ENCOUNTER — Encounter: Payer: Self-pay | Admitting: Neurology

## 2020-01-01 ENCOUNTER — Ambulatory Visit: Payer: Medicare Other | Admitting: Neurology

## 2020-01-01 ENCOUNTER — Telehealth: Payer: Self-pay | Admitting: *Deleted

## 2020-01-01 NOTE — Telephone Encounter (Signed)
Pt no showed new patient appt with Dr Leonie Man this afternoon. This is his first no show at [facility].

## 2020-01-07 ENCOUNTER — Other Ambulatory Visit: Payer: Self-pay

## 2020-01-07 ENCOUNTER — Inpatient Hospital Stay (HOSPITAL_COMMUNITY): Payer: Medicare Other | Attending: Hematology

## 2020-01-07 ENCOUNTER — Inpatient Hospital Stay (HOSPITAL_COMMUNITY): Payer: Medicare Other

## 2020-01-07 ENCOUNTER — Inpatient Hospital Stay (HOSPITAL_BASED_OUTPATIENT_CLINIC_OR_DEPARTMENT_OTHER): Payer: Medicare Other | Admitting: Hematology

## 2020-01-07 VITALS — BP 104/63 | HR 71 | Temp 97.0°F | Resp 18 | Wt 163.4 lb

## 2020-01-07 DIAGNOSIS — E559 Vitamin D deficiency, unspecified: Secondary | ICD-10-CM | POA: Diagnosis not present

## 2020-01-07 DIAGNOSIS — N189 Chronic kidney disease, unspecified: Secondary | ICD-10-CM | POA: Diagnosis not present

## 2020-01-07 DIAGNOSIS — C7951 Secondary malignant neoplasm of bone: Secondary | ICD-10-CM | POA: Diagnosis not present

## 2020-01-07 DIAGNOSIS — I639 Cerebral infarction, unspecified: Secondary | ICD-10-CM | POA: Diagnosis not present

## 2020-01-07 DIAGNOSIS — C61 Malignant neoplasm of prostate: Secondary | ICD-10-CM | POA: Insufficient documentation

## 2020-01-07 DIAGNOSIS — D631 Anemia in chronic kidney disease: Secondary | ICD-10-CM | POA: Diagnosis not present

## 2020-01-07 DIAGNOSIS — M899 Disorder of bone, unspecified: Secondary | ICD-10-CM

## 2020-01-07 DIAGNOSIS — Z809 Family history of malignant neoplasm, unspecified: Secondary | ICD-10-CM | POA: Diagnosis not present

## 2020-01-07 LAB — CBC WITH DIFFERENTIAL/PLATELET
Abs Immature Granulocytes: 0.39 10*3/uL — ABNORMAL HIGH (ref 0.00–0.07)
Basophils Absolute: 0.1 10*3/uL (ref 0.0–0.1)
Basophils Relative: 0 %
Eosinophils Absolute: 0 10*3/uL (ref 0.0–0.5)
Eosinophils Relative: 0 %
HCT: 31.6 % — ABNORMAL LOW (ref 39.0–52.0)
Hemoglobin: 9.6 g/dL — ABNORMAL LOW (ref 13.0–17.0)
Immature Granulocytes: 3 %
Lymphocytes Relative: 19 %
Lymphs Abs: 2.2 10*3/uL (ref 0.7–4.0)
MCH: 28.9 pg (ref 26.0–34.0)
MCHC: 30.4 g/dL (ref 30.0–36.0)
MCV: 95.2 fL (ref 80.0–100.0)
Monocytes Absolute: 1.1 10*3/uL — ABNORMAL HIGH (ref 0.1–1.0)
Monocytes Relative: 10 %
Neutro Abs: 7.7 10*3/uL (ref 1.7–7.7)
Neutrophils Relative %: 68 %
Platelets: 291 10*3/uL (ref 150–400)
RBC: 3.32 MIL/uL — ABNORMAL LOW (ref 4.22–5.81)
RDW: 18.6 % — ABNORMAL HIGH (ref 11.5–15.5)
WBC: 11.5 10*3/uL — ABNORMAL HIGH (ref 4.0–10.5)
nRBC: 0 % (ref 0.0–0.2)

## 2020-01-07 LAB — COMPREHENSIVE METABOLIC PANEL
ALT: 33 U/L (ref 0–44)
AST: 35 U/L (ref 15–41)
Albumin: 3.6 g/dL (ref 3.5–5.0)
Alkaline Phosphatase: 56 U/L (ref 38–126)
Anion gap: 11 (ref 5–15)
BUN: 16 mg/dL (ref 8–23)
CO2: 25 mmol/L (ref 22–32)
Calcium: 9.3 mg/dL (ref 8.9–10.3)
Chloride: 102 mmol/L (ref 98–111)
Creatinine, Ser: 1.6 mg/dL — ABNORMAL HIGH (ref 0.61–1.24)
GFR calc Af Amer: 49 mL/min — ABNORMAL LOW (ref >60)
GFR calc non Af Amer: 42 mL/min — ABNORMAL LOW (ref >60)
Glucose, Bld: 94 mg/dL (ref 70–99)
Potassium: 4.1 mmol/L (ref 3.5–5.1)
Sodium: 138 mmol/L (ref 135–145)
Total Bilirubin: 0.3 mg/dL (ref 0.3–1.2)
Total Protein: 7.4 g/dL (ref 6.5–8.1)

## 2020-01-07 LAB — MAGNESIUM: Magnesium: 1.7 mg/dL (ref 1.7–2.4)

## 2020-01-07 LAB — PSA: Prostatic Specific Antigen: 1.55 ng/mL (ref 0.00–4.00)

## 2020-01-07 MED ORDER — SODIUM CHLORIDE 0.9% FLUSH
10.0000 mL | Freq: Once | INTRAVENOUS | Status: AC
  Administered 2020-01-07: 10 mL via INTRAVENOUS

## 2020-01-07 MED ORDER — HEPARIN SOD (PORK) LOCK FLUSH 100 UNIT/ML IV SOLN
500.0000 [IU] | Freq: Once | INTRAVENOUS | Status: AC
  Administered 2020-01-07: 500 [IU] via INTRAVENOUS

## 2020-01-07 NOTE — Patient Instructions (Addendum)
[place] at [facility] Discharge Instructions  You were seen today by Dr. Delton Coombes. He went over your recent results. You did not receive your treatment today. You will be scheduled for a CT scan of your abdomen and bones. You will also be referred to genetic testing. Make sure you drink at least 48 ounces of fluids daily. Dr. Delton Coombes will see you back after your scans for labs and follow up.   Thank you for choosing [place] at [facility] to provide your oncology and hematology care.  To afford each patient quality time with our provider, please arrive at least 15 minutes before your scheduled appointment time.   If you have a lab appointment with the [facility] please come in thru the Main Entrance and check in at the main information desk  You need to re-schedule your appointment should you arrive 10 or more minutes late.  We strive to give you quality time with our providers, and arriving late affects you and other patients whose appointments are after yours.  Also, if you no show three or more times for appointments you may be dismissed from the clinic at the providers discretion.     Again, thank you for choosing [facility].  Our hope is that these requests will decrease the amount of time that you wait before being seen by our physicians.       _____________________________________________________________  Should you have questions after your visit to [facility], please contact our office at [phone] between the hours of 8:00 a.m. and 4:30 p.m.  Voicemails left after 4:00 p.m. will not be returned until the following business day.  For prescription refill requests, have your pharmacy contact our office and allow 72 hours.    Cancer Center Support Programs:   > Cancer Support Group  2nd Tuesday of the month 1pm-2pm, Journey Room

## 2020-01-07 NOTE — Progress Notes (Signed)
[place], [name] [id number]   CLINIC:  Medical Oncology/Hematology  PCP:  Patient, No Pcp Per None None   REASON FOR VISIT:  Follow-up for metastatic prostate cancer  PRIOR THERAPY:  1. Bilateral orchiectomy on 02/05/2014. 2. Docetaxel x 6 cycles from 03/14/2014 to 06/26/2014.  NGS Results: Not done  CURRENT THERAPY: Cabazitaxel every 3 weeks  BRIEF ONCOLOGIC HISTORY:  Oncology History  Malignant neoplasm of prostate ([place])  01/26/2014 Tumor Marker   PSA > 5000   01/28/2014 Initial Biopsy   Metastatic adenocarcinoma of prostate   02/05/2014 Surgery   Bilateral orchiectomy by Dr. Junious Silk   02/26/2014 Tumor Marker   PSA = 399.5   03/14/2014 Tumor Marker   PSA= 89.51   03/14/2014 - 06/26/2014 Chemotherapy   Docetaxel 75 mg/kg every 21 days x 6 cycles   04/24/2014 Tumor Marker   PSA- 19.89   07/23/2014 Procedure   Nephrostomy tube removed by IR, Dr. Geroge Baseman   07/24/2014 Tumor Marker   PSA= 6.15   10/16/2014 Tumor Marker   PSA: 3.54    01/08/2015 Tumor Marker   PSA: 2.13    01/17/2015 Imaging   CT CAP-  Massive pelvic and retroperitoneal lymphadenopathy noted on the prior study has nearly completely resolved, now with only a small amount of residual amorphous soft tissue predominantly around the the infrarenal abdominal aorta.   01/17/2015 Imaging   Bone scan- Widespread osseous metastatic disease with multiple foci of increased activity throughout the skeleton, corresponding with the findings on the prior CT from October, 2015.   04/11/2015 Tumor Marker   PSA: 1.87    07/21/2015 Imaging   Bone scan- Bony metastatic disease again noted at multiple sites, stable from prior study. No progression of bony metastatic disease is demonstrable on this study.   07/25/2015 Imaging   CT CAP- Stable matted soft tissue density in the retroperitoneum and extraperitoneal pelvis consistent with treated disease. No recurrent  lymphadenopathy.  No pulmonary metastatic disease. Diffuse stable sclerotic metastatic bone disease.   01/14/2016 Imaging   Bone scan-  Multiple sites of abnormal increased tracer localization involving BILATERAL ribs, T11, T12, questionably RIGHT scapula and LEFT iliac bone suspicious for osseous metastases.   01/23/2016 Imaging   CT CAP- 1. Similar widespread osseous metastasis. 2. Similar soft tissue thickening within the retroperitoneum of the abdomen. Improved left pelvic side wall soft tissue thickening. These are consistent with sites of treated disease. No well-defined adenopathy. 3. No new sites of disease.   05/03/2016 -  Chemotherapy   Jevtana every 21 days    05/03/2016 -  Chemotherapy   The patient had pegfilgrastim (NEULASTA) injection 6 mg, 6 mg, Subcutaneous,  Once, 1 of 1 cycle Administration: 6 mg (05/05/2016) pegfilgrastim (NEULASTA ONPRO KIT) injection 6 mg, 6 mg, Subcutaneous, Once, 54 of 59 cycles Administration: 6 mg (05/24/2016), 6 mg (06/14/2016), 6 mg (07/07/2016), 6 mg (07/28/2016), 6 mg (08/18/2016), 6 mg (09/08/2016), 6 mg (09/29/2016), 6 mg (10/20/2016), 6 mg (11/10/2016), 6 mg (02/14/2017), 6 mg (03/07/2017), 6 mg (03/28/2017), 6 mg (04/20/2017), 6 mg (05/11/2017), 6 mg (06/27/2017), 6 mg (08/30/2017), 6 mg (09/20/2017), 6 mg (10/11/2017), 6 mg (11/02/2017), 6 mg (08/09/2017), 6 mg (11/23/2017), 6 mg (12/14/2017), 6 mg (01/09/2018), 6 mg (01/31/2018), 6 mg (04/04/2018), 6 mg (04/27/2018), 6 mg (05/18/2018), 6 mg (06/08/2018), 6 mg (06/29/2018), 6 mg (07/20/2018), 6 mg (08/10/2018), 6 mg (09/01/2018), 6 mg (09/22/2018), 6 mg (10/13/2018), 6 mg (11/03/2018), 6 mg (11/24/2018), 6 mg (  12/15/2018), 6 mg (01/05/2019), 6 mg (01/26/2019), 6 mg (02/16/2019), 6 mg (03/12/2019), 6 mg (04/02/2019), 6 mg (04/23/2019), 6 mg (05/14/2019), 6 mg (06/04/2019), 6 mg (06/25/2019), 6 mg (07/16/2019), 6 mg (08/16/2019), 6 mg (09/06/2019), 6 mg (09/27/2019), 6 mg (10/18/2019), 6 mg (12/17/2019) cabazitaxel (JEVTANA) 41 mg in dextrose 5 % 250 mL  chemo infusion, 20 mg/m2 = 41 mg (100 % of original dose 20 mg/m2), Intravenous,  Once, 55 of 60 cycles Dose modification: 20 mg/m2 (original dose 20 mg/m2, Cycle 1, Reason: Dose not tolerated, Comment: recommended by Data to use 23m/m), 20 mg/m2 (original dose 20 mg/m2, Cycle 37, Reason: Other (see comments), Comment: Changed to new orderable), 21 mg/m2 (original dose 20 mg/m2, Cycle 40, Reason: Other (see comments)), 20 mg/m2 (original dose 20 mg/m2, Cycle 40, Reason: Other (see comments)) Administration: 41 mg (05/03/2016), 41 mg (05/24/2016), 41 mg (06/14/2016), 41 mg (07/07/2016), 41 mg (07/28/2016), 41 mg (08/18/2016), 41 mg (09/08/2016), 41 mg (09/29/2016), 41 mg (10/20/2016), 41 mg (11/10/2016), 41 mg (02/14/2017), 41 mg (03/07/2017), 41 mg (03/28/2017), 41 mg (04/20/2017), 41 mg (05/11/2017), 41 mg (06/27/2017), 41 mg (08/30/2017), 41 mg (09/20/2017), 41 mg (10/11/2017), 41 mg (11/02/2017), 41 mg (08/09/2017), 41 mg (11/23/2017), 41 mg (12/14/2017), 41 mg (01/09/2018), 41 mg (01/31/2018), 41 mg (02/21/2018), 41 mg (04/04/2018), 41 mg (04/27/2018), 41 mg (05/18/2018), 41 mg (06/08/2018), 41 mg (06/29/2018), 41 mg (07/20/2018), 41 mg (08/10/2018), 41 mg (09/01/2018), 41 mg (09/22/2018), 41 mg (10/13/2018), 41 mg (11/03/2018), 41 mg (11/24/2018), 41 mg (12/15/2018), 41 mg (01/05/2019), 41 mg (01/26/2019), 41 mg (02/16/2019), 41 mg (03/12/2019), 41 mg (04/02/2019), 41 mg (04/23/2019), 41 mg (05/14/2019), 41 mg (06/04/2019), 41 mg (06/25/2019), 41 mg (07/16/2019), 41 mg (08/16/2019), 41 mg (09/06/2019), 41 mg (09/27/2019), 41 mg (10/18/2019), 41 mg (12/17/2019)  for chemotherapy treatment.    09/06/2016 Imaging   Restaging CT C/A/P: IMPRESSION: 1. Similar appearance of widespread sclerotic bone metastases. 2. No change in soft tissue thickening within the retroperitoneum and left pelvic sidewall. No well defined adenopathy identified. 3. No new sites of disease 4. Aortic Atherosclerosis (ICD10-I70.0). Coronary artery calcifications noted. 5. 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 6. Stable 9 mm pancreatic cystic lesion. Favor pseudocyst. Indolent neoplasm may look similar. Attention on follow-up imaging.   09/06/2016 Imaging   Bone Scan: IMPRESSION: In this patient with known diffuse sclerotic metastatic disease, bone scan does not reveal progression of radiotracer uptake. Several of the areas of previously demonstrated radiotracer uptake appear less prominent.  Change in appearance of radiotracer uptake involving the mandible now more notable on the right and previously more notable on the left may reflect result of dental disease rather than metastatic disease.    04/08/2017 Tumor Marker   PSA 0.89     CANCER STAGING: Cancer Staging No matching staging information was found for the patient.  INTERVAL HISTORY:  [name] a 74y.o. male, returns for routine follow-up and consideration for next cycle of chemotherapy. DJiovaniwas last seen on 12/17/2019.  Due for cycle #56 of cabazitaxel today.   Overall, he tells me he has been feeling pretty well. He reports feeling fatigued for 1 day after the treatment, but denies numbness, tingling or pain in his extremities. He denies F/C, dysuria or burning with urination. He denies having any new aches or pains or jaw pain. His weakness from his CVA has improved to baseline.  His brother had some kind of cancer necessitating radiation.  REVIEW OF SYSTEMS:  Review  of Systems  Constitutional: Positive for fatigue (mild; 1 day after Tx). Negative for appetite change, chills and fever.  Genitourinary: Negative for difficulty urinating and dysuria.   Musculoskeletal: Negative for arthralgias and myalgias.  Neurological: Negative for extremity weakness and numbness.  Psychiatric/Behavioral: Positive for depression. The patient is nervous/anxious.   All other systems reviewed and are negative.   PAST  MEDICAL/SURGICAL HISTORY:  Past Medical History:  Diagnosis Date  . Acute renal failure ([place]) 01/26/2014  . Alcohol abuse   . Anemia of chronic disease 01/26/2014  . Bilateral hydronephrosis 01/26/2014  . History of cocaine abuse ([place]) 01/26/2014  . Homelessness 01/31/2014  . Prostate cancer metastatic to multiple sites ([place]) 01/30/2014  . Sepsis ([name])   . UTI (lower urinary tract infection)    Past Surgical History:  Procedure Laterality Date  . ORCHIECTOMY Bilateral 02/05/2014   Procedure: BILATERAL ORCHIECTOMY;  Surgeon: Festus Aloe, MD;  Location: [facility];  Service: Urology;  Laterality: Bilateral;  . PERCUTANEOUS NEPHROSTOMY Bilateral    IR Dr. Junious Silk changed on 04/22/2014  . PORT-A-CATH REMOVAL Right 08/08/2017   Procedure: REMOVAL PORT-A-CATH RIGHT CHEST (PROCEDURE #2);  Surgeon: Aviva Signs, MD;  Location: [facility];  Service: General;  Laterality: Right;  . PORTACATH PLACEMENT Right 03/12/14  . PORTACATH PLACEMENT Left 08/08/2017   Procedure: INSERTION POWER PORT WITH ATTACHED CATHETER LEFT SUBCLAVIAN (PROCEDURE #1);  Surgeon: Aviva Signs, MD;  Location: [facility];  Service: General;  Laterality: Left;    SOCIAL HISTORY:  Social History   Socioeconomic History  . Marital status: Single    Spouse name: Not on file  . Number of children: 1  . Years of education: 9th  . Highest education level: Not on file  Occupational History  . Occupation: disabilty    Employer: [employer]  Tobacco Use  . Smoking status: Current Every Day Smoker    Packs/day: 0.25    Years: 55.00    Pack years: 13.75    Types: Cigarettes  . Smokeless tobacco: Never Used  Vaping Use  . Vaping Use: Never used  Substance and Sexual Activity  . Alcohol use: Yes    Alcohol/week: 1.0 standard drink    Types: 1 Cans of beer per week  . Drug use: Yes    Types: Cocaine    Comment: last used Cocaine 01/25/14  . Sexual activity: Yes  Other Topics Concern  . Not on file  Social History  Narrative   Lives at [employer] 9[employer] grade   1 son, lives in [place], [place]   Can read and write in native language            Social Determinants of Health   Financial Resource Strain:   . Difficulty of Paying Living Expenses: Not on file  Food Insecurity:   . Worried About Charity fundraiser in the Last Year: Not on file  . Ran Out of Food in the Last Year: Not on file  Transportation Needs:   . Lack of Transportation (Medical): Not on file  . Lack of Transportation (Non-Medical): Not on file  Physical Activity:   . Days of Exercise per Week: Not on file  . Minutes of Exercise per Session: Not on file  Stress:   . Feeling of Stress : Not on file  Social Connections:   . Frequency of Communication with Friends and Family: Not on file  . Frequency of Social Gatherings with Friends and Family: Not on file  .  Attends Religious Services: Not on file  . Active Member of Clubs or Organizations: Not on file  . Attends Archivist Meetings: Not on file  . Marital Status: Not on file  Intimate Partner Violence:   . Fear of Current or Ex-Partner: Not on file  . Emotionally Abused: Not on file  . Physically Abused: Not on file  . Sexually Abused: Not on file    FAMILY HISTORY:  Family History  Problem Relation Age of Onset  . Cancer Brother 80    CURRENT MEDICATIONS:  Current Outpatient Medications  Medication Sig Dispense Refill  . aspirin EC 81 MG EC tablet Take 1 tablet (81 mg total) by mouth daily. Swallow whole. 30 tablet 11  . atorvastatin (LIPITOR) 40 MG tablet Take 1 tablet (40 mg total) by mouth daily.    . famotidine (PEPCID) 10 MG tablet Take 1 tablet (10 mg total) by mouth daily as needed for heartburn or indigestion.    . Menthol-Methyl Salicylate (MUSCLE RUB) 10-15 % CREA Apply 1 application topically 2 (two) times daily as needed for muscle pain. To back and hips  0  . midodrine (PROAMATINE) 10 MG tablet Take 1 tablet (10 mg total) by  mouth 3 (three) times daily with meals.    . simethicone (MYLICON) 80 MG chewable tablet Chew 1 tablet (80 mg total) by mouth 4 (four) times daily as needed for flatulence. 30 tablet 0   No current facility-administered medications for this visit.   Facility-Administered Medications Ordered in Other Visits  Medication Dose Route Frequency Provider Last Rate Last Admin  . cyanocobalamin ((VITAMIN B-12)) injection 1,000 mcg  1,000 mcg Intramuscular Once Baird Cancer, PA-C        ALLERGIES:  Allergies  Allergen Reactions  . Chlorhexidine     PHYSICAL EXAM:  Performance status (ECOG): 1 - Symptomatic but completely ambulatory  Vitals:   01/07/20 0944  BP: 104/63  Pulse: 71  Resp: 18  Temp: (!) 97 F (36.1 C)  SpO2: 100%   Wt Readings from Last 3 Encounters:  01/07/20 163 lb 6.4 oz (74.1 kg)  12/17/19 157 lb 3.2 oz (71.3 kg)  11/20/19 156 lb 15.5 oz (71.2 kg)   Physical Exam Vitals reviewed.  Constitutional:      Appearance: Normal appearance.  Cardiovascular:     Rate and Rhythm: Normal rate and regular rhythm.     Pulses: Normal pulses.     Heart sounds: Normal heart sounds.  Pulmonary:     Effort: Pulmonary effort is normal.     Breath sounds: Normal breath sounds.  Chest:     Comments: Port-a-Cath in L chest Abdominal:     Palpations: Abdomen is soft. There is no mass.     Tenderness: There is no abdominal tenderness.     Hernia: No hernia is present.  Neurological:     General: No focal deficit present.     Mental Status: He is alert and oriented to person, place, and time.  Psychiatric:        Mood and Affect: Mood normal.        Behavior: Behavior normal.     LABORATORY DATA:  I have reviewed the labs as listed.  CBC Latest Ref Rng & Units 01/07/2020 12/17/2019 11/19/2019  WBC 4.0 - 10.5 K/uL 11.5(H) 5.2 6.2  Hemoglobin 13.0 - 17.0 g/dL 9.6(L) 10.5(L) 9.8(L)  Hematocrit 39 - 52 % 31.6(L) 33.8(L) 30.7(L)  Platelets 150 - 400 K/uL 291 310 432(H)  CMP Latest Ref Rng & Units 01/07/2020 12/17/2019 11/20/2019  Glucose 70 - 99 mg/dL 94 104(H) 87  BUN 8 - 23 mg/dL 16 22 29(H)  Creatinine 0.61 - 1.24 mg/dL 1.60(H) 1.55(H) 1.46(H)  Sodium 135 - 145 mmol/L 138 136 136  Potassium 3.5 - 5.1 mmol/L 4.1 3.7 4.3  Chloride 98 - 111 mmol/L 102 101 104  CO2 22 - 32 mmol/L 25 24 21(L)  Calcium 8.9 - 10.3 mg/dL 9.3 9.1 9.3  Total Protein 6.5 - 8.1 g/dL 7.4 8.6(H) -  Total Bilirubin 0.3 - 1.2 mg/dL 0.3 0.6 -  Alkaline Phos 38 - 126 U/L 56 59 -  AST 15 - 41 U/L 35 18 -  ALT 0 - 44 U/L 33 12 -   Lab Results  Component Value Date   PSA 1.79 12/17/2019   PSA 1.49 09/27/2019   PSA 1.29 09/06/2019   DIAGNOSTIC IMAGING:  I have independently reviewed the scans and discussed with the patient. No results found.   ASSESSMENT:  1. 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 -6 cycles of docetaxel from 03/14/2014 through 06/26/2014. -Cabazitaxel every 3 weeks started on 05/03/2016. -Baseline PSA between 0.7 and 1. -Bone scan on 08/03/2019 showed uptake in the right mandible, slightly more progressive than prior study in April 2019. Multiple additional studies in the ribs and pelvis are not identified. Uptake in the left maxilla, question dental disease. -CT CAP on 08/03/2019 did not show any significant change from August 2018 CT study. No new or progressive disease. Stable extensive sclerotic bone lesions throughout the axial and proximal appendicular skeleton. Chronic pathological mid T3 vertebral fracture. No pathologically enlarged abdominal lymph nodes.  2.  Left-sided stroke with right hemiparesis: -Hospitalized from 10/25/2019 through 11/20/2019 with right-sided weakness, right facial droop, MRI/MRA revealing acute left caudate/putamen infarct.  MRA showed occlusion of the superior division of the left MCA M2 branch.  Urine cocaine was positive. -He underwent rehab and most of his strength was  regained.   PLAN:  1. 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 -However last PSA was 1.79 on 12/17/2019.  This has steadily gone up in the last 3 times. -I have recommended holding his treatment today. -I recommended bone scan and CT scan of the abdomen and pelvis.  We will give him hydration as his creatinine is mildly elevated than his baseline at 1.79. -I have also recommended guardant 360 CDX testing and germline mutation testing.  Family history consistent with her brother with cancer, type unknown to the patient. -He will come back in 2 weeks for follow-up.  2. Bone metastasis: -Continue denosumab monthly.  3. Renal insufficiency: -Creatinine increased to 1.79 from 1.49 previously.  4. 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  Hemoglobin is 9.6.  5. Vitamin D deficiency: -Continue vitamin D supplements at home.  6.  CVA: -Continue aspirin 81 mg and Lipitor 40 mg.   Orders placed this encounter:  No orders of the defined types were placed in this encounter.    Derek Jack, MD [place] [phone]   I, Milinda Antis, am acting as a scribe for Dr. Sanda Linger.  I, Derek Jack MD, have reviewed the above documentation for accuracy and completeness, and I agree with the above.

## 2020-01-07 NOTE — Progress Notes (Signed)
Treatment held today.  PSA rising.

## 2020-01-10 ENCOUNTER — Inpatient Hospital Stay (HOSPITAL_COMMUNITY): Payer: Medicare Other

## 2020-01-10 ENCOUNTER — Encounter (HOSPITAL_COMMUNITY): Payer: Self-pay

## 2020-01-10 ENCOUNTER — Other Ambulatory Visit: Payer: Self-pay

## 2020-01-10 ENCOUNTER — Ambulatory Visit (HOSPITAL_COMMUNITY)
Admission: RE | Admit: 2020-01-10 | Discharge: 2020-01-10 | Disposition: A | Discharge status: Home / Self Care | Payer: Medicare Other | Source: Ambulatory Visit | Attending: Hematology | Admitting: Hematology

## 2020-01-10 DIAGNOSIS — I639 Cerebral infarction, unspecified: Secondary | ICD-10-CM | POA: Diagnosis not present

## 2020-01-10 DIAGNOSIS — M899 Disorder of bone, unspecified: Secondary | ICD-10-CM

## 2020-01-10 DIAGNOSIS — E559 Vitamin D deficiency, unspecified: Secondary | ICD-10-CM | POA: Diagnosis not present

## 2020-01-10 DIAGNOSIS — C7951 Secondary malignant neoplasm of bone: Secondary | ICD-10-CM | POA: Diagnosis not present

## 2020-01-10 DIAGNOSIS — N1330 Unspecified hydronephrosis: Secondary | ICD-10-CM | POA: Diagnosis not present

## 2020-01-10 DIAGNOSIS — I7090 Unspecified atherosclerosis: Secondary | ICD-10-CM | POA: Diagnosis not present

## 2020-01-10 DIAGNOSIS — N189 Chronic kidney disease, unspecified: Secondary | ICD-10-CM | POA: Diagnosis not present

## 2020-01-10 DIAGNOSIS — C61 Malignant neoplasm of prostate: Secondary | ICD-10-CM | POA: Insufficient documentation

## 2020-01-10 DIAGNOSIS — N261 Atrophy of kidney (terminal): Secondary | ICD-10-CM | POA: Diagnosis not present

## 2020-01-10 DIAGNOSIS — D631 Anemia in chronic kidney disease: Secondary | ICD-10-CM | POA: Diagnosis not present

## 2020-01-10 LAB — COMPREHENSIVE METABOLIC PANEL
ALT: 24 U/L (ref 0–44)
AST: 23 U/L (ref 15–41)
Albumin: 3.4 g/dL — ABNORMAL LOW (ref 3.5–5.0)
Alkaline Phosphatase: 51 U/L (ref 38–126)
Anion gap: 9 (ref 5–15)
BUN: 18 mg/dL (ref 8–23)
CO2: 23 mmol/L (ref 22–32)
Calcium: 9.1 mg/dL (ref 8.9–10.3)
Chloride: 101 mmol/L (ref 98–111)
Creatinine, Ser: 1.48 mg/dL — ABNORMAL HIGH (ref 0.61–1.24)
GFR calc Af Amer: 54 mL/min — ABNORMAL LOW (ref >60)
GFR calc non Af Amer: 46 mL/min — ABNORMAL LOW (ref >60)
Glucose, Bld: 106 mg/dL — ABNORMAL HIGH (ref 70–99)
Potassium: 4 mmol/L (ref 3.5–5.1)
Sodium: 133 mmol/L — ABNORMAL LOW (ref 135–145)
Total Bilirubin: 0.4 mg/dL (ref 0.3–1.2)
Total Protein: 7.4 g/dL (ref 6.5–8.1)

## 2020-01-10 LAB — CBC WITH DIFFERENTIAL/PLATELET
Abs Immature Granulocytes: 0.12 10*3/uL — ABNORMAL HIGH (ref 0.00–0.07)
Basophils Absolute: 0 10*3/uL (ref 0.0–0.1)
Basophils Relative: 0 %
Eosinophils Absolute: 0 10*3/uL (ref 0.0–0.5)
Eosinophils Relative: 0 %
HCT: 27.9 % — ABNORMAL LOW (ref 39.0–52.0)
Hemoglobin: 8.5 g/dL — ABNORMAL LOW (ref 13.0–17.0)
Immature Granulocytes: 1 %
Lymphocytes Relative: 19 %
Lymphs Abs: 1.6 10*3/uL (ref 0.7–4.0)
MCH: 28.3 pg (ref 26.0–34.0)
MCHC: 30.5 g/dL (ref 30.0–36.0)
MCV: 93 fL (ref 80.0–100.0)
Monocytes Absolute: 0.9 10*3/uL (ref 0.1–1.0)
Monocytes Relative: 10 %
Neutro Abs: 6 10*3/uL (ref 1.7–7.7)
Neutrophils Relative %: 70 %
Platelets: 343 10*3/uL (ref 150–400)
RBC: 3 MIL/uL — ABNORMAL LOW (ref 4.22–5.81)
RDW: 18.6 % — ABNORMAL HIGH (ref 11.5–15.5)
WBC: 8.7 10*3/uL (ref 4.0–10.5)
nRBC: 0 % (ref 0.0–0.2)

## 2020-01-10 MED ORDER — IOHEXOL 300 MG/ML  SOLN
100.0000 mL | Freq: Once | INTRAMUSCULAR | Status: AC | PRN
  Administered 2020-01-10: 100 mL via INTRAVENOUS

## 2020-01-10 MED ORDER — HEPARIN SOD (PORK) LOCK FLUSH 100 UNIT/ML IV SOLN
INTRAVENOUS | Status: AC
  Administered 2020-01-10: 500 [IU]
  Filled 2020-01-10: qty 5

## 2020-01-10 MED ORDER — OCTREOTIDE ACETATE 30 MG IM KIT
PACK | INTRAMUSCULAR | Status: AC
  Filled 2020-01-10: qty 1

## 2020-01-10 MED ORDER — SODIUM CHLORIDE 0.9 % IV SOLN: INTRAVENOUS | Status: AC

## 2020-01-10 NOTE — Progress Notes (Signed)
Patient presents today for lab work and IV fluids prior to CT scan. Vital signs stable. Patient has no complaints of any pain today. MAR reviewed.   Treatment given today per MD orders. Tolerated infusion without adverse affects. Vital signs stable. No complaints at this time. Discharged from clinic ambulatory in stable condition. Alert and oriented x 3. F/U with [facility] as scheduled.

## 2020-01-10 NOTE — Patient Instructions (Signed)
[facility] at [facility]  Discharge Instructions:   _______________________________________________________________  Thank you for choosing [facility] at [facility] to provide your oncology and hematology care.  To afford each patient quality time with our providers, please arrive at least 15 minutes before your scheduled appointment.  You need to re-schedule your appointment if you arrive 10 or more minutes late.  We strive to give you quality time with our providers, and arriving late affects you and other patients whose appointments are after yours.  Also, if you no show three or more times for appointments you may be dismissed from the clinic.  Again, thank you for choosing [facility] at [facility]. Our hope is that these requests will allow you access to exceptional care and in a timely manner. _______________________________________________________________  If you have questions after your visit, please contact our office at [phone] between the hours of 8:30 a.m. and 5:00 p.m. Voicemails left after 4:30 p.m. will not be returned until the following business day. _______________________________________________________________  For prescription refill requests, have your pharmacy contact our office. _______________________________________________________________  Recommendations made by the consultant and any test results will be sent to your referring physician. _______________________________________________________________ 

## 2020-01-11 ENCOUNTER — Telehealth: Payer: Self-pay | Admitting: Genetic Counselor

## 2020-01-11 NOTE — Telephone Encounter (Signed)
Patient previously requested phone visit for genetic counseling appointment on 9/30.  LVM explaining that we are unable to offer phone visits but offered a virtual video visit or an in-person visit.  Left contact information requesting a call back.

## 2020-01-13 DIAGNOSIS — C7951 Secondary malignant neoplasm of bone: Secondary | ICD-10-CM | POA: Diagnosis not present

## 2020-01-14 ENCOUNTER — Encounter (HOSPITAL_COMMUNITY): Payer: Self-pay

## 2020-01-14 ENCOUNTER — Other Ambulatory Visit (HOSPITAL_COMMUNITY): Payer: Medicare Other

## 2020-01-14 ENCOUNTER — Ambulatory Visit (HOSPITAL_COMMUNITY): Payer: Medicare Other

## 2020-01-16 NOTE — Telephone Encounter (Signed)
Called patient again as he previously requested phone visit for genetic counseling appt on 9/30.  LVM explaining that we are unable to offer phone visits and provided the option of a virtual video visit or in-person visit.  Left contact information requesting a call back.

## 2020-01-17 ENCOUNTER — Other Ambulatory Visit: Payer: Self-pay

## 2020-01-17 ENCOUNTER — Encounter (HOSPITAL_COMMUNITY)
Admission: RE | Admit: 2020-01-17 | Discharge: 2020-01-17 | Disposition: A | Discharge status: Home / Self Care | Payer: Medicare Other | Source: Ambulatory Visit | Attending: Hematology | Admitting: Hematology

## 2020-01-17 DIAGNOSIS — C61 Malignant neoplasm of prostate: Secondary | ICD-10-CM | POA: Diagnosis not present

## 2020-01-17 DIAGNOSIS — N261 Atrophy of kidney (terminal): Secondary | ICD-10-CM | POA: Diagnosis not present

## 2020-01-17 DIAGNOSIS — C7951 Secondary malignant neoplasm of bone: Secondary | ICD-10-CM | POA: Diagnosis not present

## 2020-01-17 DIAGNOSIS — N1330 Unspecified hydronephrosis: Secondary | ICD-10-CM | POA: Diagnosis not present

## 2020-01-17 MED ORDER — TECHNETIUM TC 99M MEDRONATE IV KIT
20.0000 | PACK | Freq: Once | INTRAVENOUS | Status: AC | PRN
  Administered 2020-01-17: 18.8 via INTRAVENOUS

## 2020-01-21 ENCOUNTER — Inpatient Hospital Stay (HOSPITAL_COMMUNITY): Payer: Medicare Other

## 2020-01-21 ENCOUNTER — Other Ambulatory Visit (HOSPITAL_COMMUNITY): Payer: Self-pay | Admitting: Hematology

## 2020-01-21 ENCOUNTER — Encounter (HOSPITAL_COMMUNITY): Payer: Self-pay | Admitting: Hematology

## 2020-01-21 ENCOUNTER — Telehealth: Payer: Self-pay | Admitting: Genetic Counselor

## 2020-01-21 ENCOUNTER — Inpatient Hospital Stay (HOSPITAL_BASED_OUTPATIENT_CLINIC_OR_DEPARTMENT_OTHER): Payer: Medicare Other | Admitting: Hematology

## 2020-01-21 ENCOUNTER — Other Ambulatory Visit: Payer: Self-pay

## 2020-01-21 VITALS — BP 92/57 | HR 82 | Temp 96.8°F | Resp 18 | Wt 154.2 lb

## 2020-01-21 DIAGNOSIS — Z7189 Other specified counseling: Secondary | ICD-10-CM

## 2020-01-21 DIAGNOSIS — C61 Malignant neoplasm of prostate: Secondary | ICD-10-CM

## 2020-01-21 DIAGNOSIS — E559 Vitamin D deficiency, unspecified: Secondary | ICD-10-CM | POA: Diagnosis not present

## 2020-01-21 DIAGNOSIS — N189 Chronic kidney disease, unspecified: Secondary | ICD-10-CM | POA: Diagnosis not present

## 2020-01-21 DIAGNOSIS — D631 Anemia in chronic kidney disease: Secondary | ICD-10-CM | POA: Diagnosis not present

## 2020-01-21 DIAGNOSIS — I639 Cerebral infarction, unspecified: Secondary | ICD-10-CM | POA: Diagnosis not present

## 2020-01-21 DIAGNOSIS — E538 Deficiency of other specified B group vitamins: Secondary | ICD-10-CM

## 2020-01-21 DIAGNOSIS — M899 Disorder of bone, unspecified: Secondary | ICD-10-CM

## 2020-01-21 DIAGNOSIS — C7951 Secondary malignant neoplasm of bone: Secondary | ICD-10-CM | POA: Diagnosis not present

## 2020-01-21 LAB — CBC WITH DIFFERENTIAL/PLATELET
Abs Immature Granulocytes: 0.02 10*3/uL (ref 0.00–0.07)
Basophils Absolute: 0 10*3/uL (ref 0.0–0.1)
Basophils Relative: 1 %
Eosinophils Absolute: 0.2 10*3/uL (ref 0.0–0.5)
Eosinophils Relative: 4 %
HCT: 35.8 % — ABNORMAL LOW (ref 39.0–52.0)
Hemoglobin: 11 g/dL — ABNORMAL LOW (ref 13.0–17.0)
Immature Granulocytes: 0 %
Lymphocytes Relative: 29 %
Lymphs Abs: 1.6 10*3/uL (ref 0.7–4.0)
MCH: 28.3 pg (ref 26.0–34.0)
MCHC: 30.7 g/dL (ref 30.0–36.0)
MCV: 92 fL (ref 80.0–100.0)
Monocytes Absolute: 0.6 10*3/uL (ref 0.1–1.0)
Monocytes Relative: 11 %
Neutro Abs: 3 10*3/uL (ref 1.7–7.7)
Neutrophils Relative %: 55 %
Platelets: 348 10*3/uL (ref 150–400)
RBC: 3.89 MIL/uL — ABNORMAL LOW (ref 4.22–5.81)
RDW: 18.6 % — ABNORMAL HIGH (ref 11.5–15.5)
WBC: 5.4 10*3/uL (ref 4.0–10.5)
nRBC: 0 % (ref 0.0–0.2)

## 2020-01-21 LAB — COMPREHENSIVE METABOLIC PANEL
ALT: 15 U/L (ref 0–44)
AST: 25 U/L (ref 15–41)
Albumin: 4.3 g/dL (ref 3.5–5.0)
Alkaline Phosphatase: 60 U/L (ref 38–126)
Anion gap: 12 (ref 5–15)
BUN: 23 mg/dL (ref 8–23)
CO2: 23 mmol/L (ref 22–32)
Calcium: 10 mg/dL (ref 8.9–10.3)
Chloride: 101 mmol/L (ref 98–111)
Creatinine, Ser: 1.86 mg/dL — ABNORMAL HIGH (ref 0.61–1.24)
GFR calc Af Amer: 41 mL/min — ABNORMAL LOW (ref >60)
GFR calc non Af Amer: 35 mL/min — ABNORMAL LOW (ref >60)
Glucose, Bld: 98 mg/dL (ref 70–99)
Potassium: 4.5 mmol/L (ref 3.5–5.1)
Sodium: 136 mmol/L (ref 135–145)
Total Bilirubin: 0.5 mg/dL (ref 0.3–1.2)
Total Protein: 9.1 g/dL — ABNORMAL HIGH (ref 6.5–8.1)

## 2020-01-21 LAB — PSA: Prostatic Specific Antigen: 2.33 ng/mL (ref 0.00–4.00)

## 2020-01-21 MED ORDER — DENOSUMAB 120 MG/1.7ML ~~LOC~~ SOLN
SUBCUTANEOUS | Status: AC
  Filled 2020-01-21: qty 1.7

## 2020-01-21 MED ORDER — ABIRATERONE ACETATE 250 MG PO TABS: 1000.0000 mg | ORAL_TABLET | Freq: Every day | ORAL | 0 refills | Status: DC

## 2020-01-21 MED ORDER — PREDNISONE 5 MG PO TABS: 5.0000 mg | ORAL_TABLET | Freq: Every day | ORAL | 6 refills | Status: DC

## 2020-01-21 MED ORDER — DENOSUMAB 120 MG/1.7ML ~~LOC~~ SOLN
120.0000 mg | Freq: Once | SUBCUTANEOUS | Status: AC
  Administered 2020-01-21: 120 mg via SUBCUTANEOUS

## 2020-01-21 NOTE — Progress Notes (Signed)
Patient has showed progression on his most recent scan. We will be changing treatment from [facility] to [facility].  Dr. Delton Coombes has explained the medications to patient.

## 2020-01-21 NOTE — Progress Notes (Signed)
[place], [place] [address]   CLINIC:  Medical Oncology/Hematology  PCP:  Patient, No Pcp Per None None   REASON FOR VISIT:  Follow-up for metastatic prostate cancer  PRIOR THERAPY:  1. Bilateral orchiectomy on 02/05/2014. 2. Docetaxel x 6 cycles from 03/14/2014 to 06/26/2014.  NGS Results: Not done  CURRENT THERAPY: Cabazitaxel every 3 weeks  BRIEF ONCOLOGIC HISTORY:  Oncology History  Malignant neoplasm of prostate ([place])  01/26/2014 Tumor Marker   PSA > 5000   01/28/2014 Initial Biopsy   Metastatic adenocarcinoma of prostate   02/05/2014 Surgery   Bilateral orchiectomy by Dr. Junious Silk   02/26/2014 Tumor Marker   PSA = 399.5   03/14/2014 Tumor Marker   PSA= 89.51   03/14/2014 - 06/26/2014 Chemotherapy   Docetaxel 75 mg/kg every 21 days x 6 cycles   04/24/2014 Tumor Marker   PSA- 19.89   07/23/2014 Procedure   Nephrostomy tube removed by IR, Dr. Geroge Baseman   07/24/2014 Tumor Marker   PSA= 6.15   10/16/2014 Tumor Marker   PSA: 3.54    01/08/2015 Tumor Marker   PSA: 2.13    01/17/2015 Imaging   CT CAP-  Massive pelvic and retroperitoneal lymphadenopathy noted on the prior study has nearly completely resolved, now with only a small amount of residual amorphous soft tissue predominantly around the the infrarenal abdominal aorta.   01/17/2015 Imaging   Bone scan- Widespread osseous metastatic disease with multiple foci of increased activity throughout the skeleton, corresponding with the findings on the prior CT from October, 2015.   04/11/2015 Tumor Marker   PSA: 1.87    07/21/2015 Imaging   Bone scan- Bony metastatic disease again noted at multiple sites, stable from prior study. No progression of bony metastatic disease is demonstrable on this study.   07/25/2015 Imaging   CT CAP- Stable matted soft tissue density in the retroperitoneum and extraperitoneal pelvis consistent with treated disease. No recurrent  lymphadenopathy.  No pulmonary metastatic disease. Diffuse stable sclerotic metastatic bone disease.   01/14/2016 Imaging   Bone scan-  Multiple sites of abnormal increased tracer localization involving BILATERAL ribs, T11, T12, questionably RIGHT scapula and LEFT iliac bone suspicious for osseous metastases.   01/23/2016 Imaging   CT CAP- 1. Similar widespread osseous metastasis. 2. Similar soft tissue thickening within the retroperitoneum of the abdomen. Improved left pelvic side wall soft tissue thickening. These are consistent with sites of treated disease. No well-defined adenopathy. 3. No new sites of disease.   05/03/2016 -  Chemotherapy   Jevtana every 21 days    05/03/2016 -  Chemotherapy   The patient had pegfilgrastim (NEULASTA) injection 6 mg, 6 mg, Subcutaneous,  Once, 1 of 1 cycle Administration: 6 mg (05/05/2016) pegfilgrastim (NEULASTA ONPRO KIT) injection 6 mg, 6 mg, Subcutaneous, Once, 54 of 59 cycles Administration: 6 mg (05/24/2016), 6 mg (06/14/2016), 6 mg (07/07/2016), 6 mg (07/28/2016), 6 mg (08/18/2016), 6 mg (09/08/2016), 6 mg (09/29/2016), 6 mg (10/20/2016), 6 mg (11/10/2016), 6 mg (02/14/2017), 6 mg (03/07/2017), 6 mg (03/28/2017), 6 mg (04/20/2017), 6 mg (05/11/2017), 6 mg (06/27/2017), 6 mg (08/30/2017), 6 mg (09/20/2017), 6 mg (10/11/2017), 6 mg (11/02/2017), 6 mg (08/09/2017), 6 mg (11/23/2017), 6 mg (12/14/2017), 6 mg (01/09/2018), 6 mg (01/31/2018), 6 mg (04/04/2018), 6 mg (04/27/2018), 6 mg (05/18/2018), 6 mg (06/08/2018), 6 mg (06/29/2018), 6 mg (07/20/2018), 6 mg (08/10/2018), 6 mg (09/01/2018), 6 mg (09/22/2018), 6 mg (10/13/2018), 6 mg (11/03/2018), 6 mg (11/24/2018), 6 mg (  12/15/2018), 6 mg (01/05/2019), 6 mg (01/26/2019), 6 mg (02/16/2019), 6 mg (03/12/2019), 6 mg (04/02/2019), 6 mg (04/23/2019), 6 mg (05/14/2019), 6 mg (06/04/2019), 6 mg (06/25/2019), 6 mg (07/16/2019), 6 mg (08/16/2019), 6 mg (09/06/2019), 6 mg (09/27/2019), 6 mg (10/18/2019), 6 mg (12/17/2019) cabazitaxel (JEVTANA) 41 mg in dextrose 5 % 250 mL  chemo infusion, 20 mg/m2 = 41 mg (100 % of original dose 20 mg/m2), Intravenous,  Once, 55 of 60 cycles Dose modification: 20 mg/m2 (original dose 20 mg/m2, Cycle 1, Reason: Dose not tolerated, Comment: recommended by Data to use 48m/m), 20 mg/m2 (original dose 20 mg/m2, Cycle 37, Reason: Other (see comments), Comment: Changed to new orderable), 21 mg/m2 (original dose 20 mg/m2, Cycle 40, Reason: Other (see comments)), 20 mg/m2 (original dose 20 mg/m2, Cycle 40, Reason: Other (see comments)) Administration: 41 mg (05/03/2016), 41 mg (05/24/2016), 41 mg (06/14/2016), 41 mg (07/07/2016), 41 mg (07/28/2016), 41 mg (08/18/2016), 41 mg (09/08/2016), 41 mg (09/29/2016), 41 mg (10/20/2016), 41 mg (11/10/2016), 41 mg (02/14/2017), 41 mg (03/07/2017), 41 mg (03/28/2017), 41 mg (04/20/2017), 41 mg (05/11/2017), 41 mg (06/27/2017), 41 mg (08/30/2017), 41 mg (09/20/2017), 41 mg (10/11/2017), 41 mg (11/02/2017), 41 mg (08/09/2017), 41 mg (11/23/2017), 41 mg (12/14/2017), 41 mg (01/09/2018), 41 mg (01/31/2018), 41 mg (02/21/2018), 41 mg (04/04/2018), 41 mg (04/27/2018), 41 mg (05/18/2018), 41 mg (06/08/2018), 41 mg (06/29/2018), 41 mg (07/20/2018), 41 mg (08/10/2018), 41 mg (09/01/2018), 41 mg (09/22/2018), 41 mg (10/13/2018), 41 mg (11/03/2018), 41 mg (11/24/2018), 41 mg (12/15/2018), 41 mg (01/05/2019), 41 mg (01/26/2019), 41 mg (02/16/2019), 41 mg (03/12/2019), 41 mg (04/02/2019), 41 mg (04/23/2019), 41 mg (05/14/2019), 41 mg (06/04/2019), 41 mg (06/25/2019), 41 mg (07/16/2019), 41 mg (08/16/2019), 41 mg (09/06/2019), 41 mg (09/27/2019), 41 mg (10/18/2019), 41 mg (12/17/2019)  for chemotherapy treatment.    09/06/2016 Imaging   Restaging CT C/A/P: IMPRESSION: 1. Similar appearance of widespread sclerotic bone metastases. 2. No change in soft tissue thickening within the retroperitoneum and left pelvic sidewall. No well defined adenopathy identified. 3. No new sites of disease 4. Aortic Atherosclerosis (ICD10-I70.0). Coronary artery calcifications noted. 5. 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 6. Stable 9 mm pancreatic cystic lesion. Favor pseudocyst. Indolent neoplasm may look similar. Attention on follow-up imaging.   09/06/2016 Imaging   Bone Scan: IMPRESSION: In this patient with known diffuse sclerotic metastatic disease, bone scan does not reveal progression of radiotracer uptake. Several of the areas of previously demonstrated radiotracer uptake appear less prominent.  Change in appearance of radiotracer uptake involving the mandible now more notable on the right and previously more notable on the left may reflect result of dental disease rather than metastatic disease.    04/08/2017 Tumor Marker   PSA 0.89   01/07/2020 Genetic Testing   Guardant 360 CDx Results:         CANCER STAGING: Cancer Staging No matching staging information was found for the patient.  INTERVAL HISTORY:  Jeff [name] a 74y.o. male, returns for routine follow-up and consideration for next cycle of chemotherapy. DQuantaviouswas last seen on 01/07/2020.  Due for cycle #56 of cabazitaxel today.   Overall, he tells me he has been feeling pretty well. He is tolerating the treatments well. He reports that he is drinking plenty of water. He denies having any new pains in his ribs or back or numbness/tingling. He denies having jaw pain or bone showing through the gums.   REVIEW OF SYSTEMS:  Review of Systems  Constitutional: Positive for appetite change (mildly decreased) and fatigue (mild).  Musculoskeletal: Negative for arthralgias and back pain.  Neurological: Negative for numbness.  All other systems reviewed and are negative.   PAST MEDICAL/SURGICAL HISTORY:  Past Medical History:  Diagnosis Date   Acute renal failure ([place]) 01/26/2014   Alcohol abuse    Anemia of chronic disease 01/26/2014   Bilateral hydronephrosis 01/26/2014   History of cocaine abuse ([place]) 01/26/2014    Homelessness 01/31/2014   Prostate cancer metastatic to multiple sites ([place]) 01/30/2014   Sepsis ([place])    UTI (lower urinary tract infection)    Past Surgical History:  Procedure Laterality Date   ORCHIECTOMY Bilateral 02/05/2014   Procedure: BILATERAL ORCHIECTOMY;  Surgeon: Festus Aloe, MD;  Location: [facility];  Service: Urology;  Laterality: Bilateral;   PERCUTANEOUS NEPHROSTOMY Bilateral    IR Dr. Junious Silk changed on 04/22/2014   PORT-A-CATH REMOVAL Right 08/08/2017   Procedure: REMOVAL PORT-A-CATH RIGHT CHEST (PROCEDURE #2);  Surgeon: Aviva Signs, MD;  Location: [facility];  Service: General;  Laterality: Right;   PORTACATH PLACEMENT Right 03/12/14   PORTACATH PLACEMENT Left 08/08/2017   Procedure: INSERTION POWER PORT WITH ATTACHED CATHETER LEFT SUBCLAVIAN (PROCEDURE #1);  Surgeon: Aviva Signs, MD;  Location: [facility];  Service: General;  Laterality: Left;    SOCIAL HISTORY:  Social History   Socioeconomic History   Marital status: Single    Spouse name: Not on file   Number of children: 1   Years of education: 9th   Highest education level: Not on file  Occupational History   Occupation: disabilty    Employer: [employer]  Tobacco Use   Smoking status: Current Every Day Smoker    Packs/day: 0.25    Years: 55.00    Pack years: 13.75    Types: Cigarettes   Smokeless tobacco: Never Used  Vaping Use   Vaping Use: Never used  Substance and Sexual Activity   Alcohol use: Yes    Alcohol/week: 1.0 standard drink    Types: 1 Cans of beer per week   Drug use: Yes    Types: Cocaine    Comment: last used Cocaine 01/25/14   Sexual activity: Yes  Other Topics Concern   Not on file  Social History Narrative   Lives at [employer] 9th grade   1 son, lives in [place], [place]   Can read and write in native language            Social Determinants of Health   Financial Resource Strain:    Difficulty of Paying Living Expenses: Not  on file  Food Insecurity:    Worried About Charity fundraiser in the Last Year: Not on file   [employer] of Food in the Last Year: Not on file  Transportation Needs:    Lack of Transportation (Medical): Not on file   Lack of Transportation (Non-Medical): Not on file  Physical Activity:    Days of Exercise per Week: Not on file   Minutes of Exercise per Session: Not on file  Stress:    Feeling of Stress : Not on file  Social Connections:    Frequency of Communication with Friends and Family: Not on file   Frequency of Social Gatherings with Friends and Family: Not on file   Attends Religious Services: Not on file   Active Member of Clubs or Organizations: Not on file   Attends Archivist Meetings: Not on file  Marital Status: Not on file  Intimate Partner Violence:    Fear of Current or Ex-Partner: Not on file   Emotionally Abused: Not on file   Physically Abused: Not on file   Sexually Abused: Not on file    FAMILY HISTORY:  Family History  Problem Relation Age of Onset   Cancer Brother 71    CURRENT MEDICATIONS:  Current Outpatient Medications  Medication Sig Dispense Refill   aspirin EC 81 MG EC tablet Take 1 tablet (81 mg total) by mouth daily. Swallow whole. 30 tablet 11   atorvastatin (LIPITOR) 40 MG tablet Take 1 tablet (40 mg total) by mouth daily.     famotidine (PEPCID) 10 MG tablet Take 1 tablet (10 mg total) by mouth daily as needed for heartburn or indigestion.     Menthol-Methyl Salicylate (MUSCLE RUB) 10-15 % CREA Apply 1 application topically 2 (two) times daily as needed for muscle pain. To back and hips  0   midodrine (PROAMATINE) 10 MG tablet Take 1 tablet (10 mg total) by mouth 3 (three) times daily with meals.     simethicone (MYLICON) 80 MG chewable tablet Chew 1 tablet (80 mg total) by mouth 4 (four) times daily as needed for flatulence. 30 tablet 0   No current facility-administered medications for this visit.    Facility-Administered Medications Ordered in Other Visits  Medication Dose Route Frequency Provider Last Rate Last Admin   cyanocobalamin ((VITAMIN B-12)) injection 1,000 mcg  1,000 mcg Intramuscular Once Kefalas, Thomas S, PA-C        ALLERGIES:  Allergies  Allergen Reactions   Chlorhexidine     PHYSICAL EXAM:  Performance status (ECOG): 1 - Symptomatic but completely ambulatory  Vitals:   01/21/20 0923  BP: (!) 92/57  Pulse: 82  Resp: 18  Temp: (!) 96.8 F (36 C)  SpO2: 99%   Wt Readings from Last 3 Encounters:  01/21/20 154 lb 3.2 oz (69.9 kg)  01/10/20 166 lb 11.2 oz (75.6 kg)  01/07/20 163 lb 6.4 oz (74.1 kg)   Physical Exam Vitals reviewed.  Constitutional:      Appearance: Normal appearance.  Cardiovascular:     Rate and Rhythm: Normal rate and regular rhythm.     Pulses: Normal pulses.     Heart sounds: Normal heart sounds.  Pulmonary:     Effort: Pulmonary effort is normal.     Breath sounds: Normal breath sounds.  Chest:     Comments: Port-a-Cath in L chest Neurological:     General: No focal deficit present.     Mental Status: He is alert and oriented to person, place, and time.  Psychiatric:        Mood and Affect: Mood normal.        Behavior: Behavior normal.     LABORATORY DATA:  I have reviewed the labs as listed.  CBC Latest Ref Rng & Units 01/21/2020 01/10/2020 01/07/2020  WBC 4.0 - 10.5 K/uL 5.4 8.7 11.5(H)  Hemoglobin 13.0 - 17.0 g/dL 11.0(L) 8.5(L) 9.6(L)  Hematocrit 39 - 52 % 35.8(L) 27.9(L) 31.6(L)  Platelets 150 - 400 K/uL 348 343 291   CMP Latest Ref Rng & Units 01/21/2020 01/10/2020 01/07/2020  Glucose 70 - 99 mg/dL 98 106(H) 94  BUN 8 - 23 mg/dL _0 Creatinine 0.61 - 1.24 mg/dL 1.86(H) 1.48(H) 1.60(H)  Sodium 135 - 145 mmol/L 136 133(L) 138  Potassium 3.5 - 5.1 mmol/L 4.5 4.0 4.1  Chloride 98 - 111 mmol/L  101 101 102  CO2 22 - 32 mmol/L _0 Calcium 8.9 - 10.3 mg/dL 10.0 9.1 9.3  Total Protein 6.5 - 8.1 g/dL  9.1(H) 7.4 7.4  Total Bilirubin 0.3 - 1.2 mg/dL 0.5 0.4 0.3  Alkaline Phos 38 - 126 U/L 60 51 56  AST 15 - 41 U/L 25 23 35  ALT 0 - 44 U/L 15 24 33   Lab Results  Component Value Date   PSA 1.18 09/06/2016   PSA 2.61 07/12/2016   PSA 2.86 07/07/2016    DIAGNOSTIC IMAGING:  I have independently reviewed the scans and discussed with the patient. NM Bone Scan Whole Body  Result Date: 01/17/2020 CLINICAL DATA:  Prostate cancer EXAM: NUCLEAR MEDICINE WHOLE BODY BONE SCAN TECHNIQUE: Whole body anterior and posterior images were obtained approximately 3 hours after intravenous injection of radiopharmaceutical. RADIOPHARMACEUTICALS:  18.8 mCi Technetium-35mMDP IV COMPARISON:  08/03/2019. Findings are also correlated with CT examination of 01/10/2020 and 08/03/2019. FINDINGS: As noted previously, innumerable sclerotic metastases noted on prior CT examination largely do not demonstrate uptake of radiotracer in keeping with treated metastatic disease. There is, however, foci within the mandible, left third rib anteriorly, mid and lower thoracic spine, left aspect of the L5 vertebral body and sternal body demonstrating relative increased uptake of radiotracer compatible with progressive metastatic disease in these locations. Foci of uptake within the shoulders, knees, and left hindfoot are likely degenerative in nature. There has developed mild left hydronephrosis. The left renal parenchyma appears atrophic. Normal soft tissue distribution. Normal uptake uptake and excretion of radiotracer from the right kidney. IMPRESSION: Multiple foci of progressive metastatic disease as described above. Interval development of left hydronephrosis involving the atrophic left kidney. Electronically Signed   By: AFidela SalisburyMD   On: 01/17/2020 23:24   CT Abdomen Pelvis W Contrast  Result Date: 01/10/2020 CLINICAL DATA:  Metastatic prostate cancer, assess treatment response EXAM: CT ABDOMEN AND PELVIS WITH CONTRAST  TECHNIQUE: Multidetector CT imaging of the abdomen and pelvis was performed using the standard protocol following bolus administration of intravenous contrast. CONTRAST:  1096mOMNIPAQUE IOHEXOL 300 MG/ML  SOLN COMPARISON:  08/03/2019 FINDINGS: Lower chest: No acute abnormality. Mild, fibrotic scarring of the included bilateral lung bases. Small, unchanged pericardial effusion. Coronary artery calcifications. Hepatobiliary: No solid liver abnormality is seen. No gallstones, gallbladder wall thickening, or biliary dilatation. Pancreas: Unremarkable. No pancreatic ductal dilatation or surrounding inflammatory changes. Spleen: Normal in size without significant abnormality. Adrenals/Urinary Tract: Adrenal glands are unremarkable. Unchanged atrophy of the left kidney. There is mild left hydronephrosis and hydroureter. Mild thickening of the urinary bladder. Stomach/Bowel: Stomach is within normal limits. Appendix appears normal. No evidence of bowel wall thickening, distention, or inflammatory changes. Vascular/Lymphatic: No significant vascular findings are present. Unchanged post treatment soft tissue thickening about the inferior aorta and vena cava (series 2, image 34). No discretely enlarged abdominal or pelvic lymph nodes. Reproductive: No mass or other significant abnormality. Other: No abdominal wall hernia or abnormality. No abdominopelvic ascites. Musculoskeletal: Unchanged diffuse sclerotic osseous metastatic disease. IMPRESSION: 1. Unchanged post treatment soft tissue thickening about the inferior aorta and vena cava. No discretely enlarged abdominal or pelvic lymph nodes. 2. Unchanged diffuse sclerotic osseous metastatic disease. 3. There is mild left hydronephrosis and hydroureter. Unchanged atrophy of the left kidney. Findings are likely due to chronic reflux or low grade obstruction. No obstructing calculus or other lesion visualized. 4. Mild thickening of the urinary bladder, likely related to chronic  outlet  obstruction. Electronically Signed   By: Eddie Candle M.D.   On: 01/10/2020 21:26     ASSESSMENT:  1. 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 -6 cycles of docetaxel from 03/14/2014 through 06/26/2014. -Cabazitaxel every 3 weeks started on 05/03/2016. -Baseline PSA between 0.7 and 1. -Bone scan on 08/03/2019 showed uptake in the right mandible, slightly more progressive than prior study in April 2019. Multiple additional studies in the ribs and pelvis are not identified. Uptake in the left maxilla, question dental disease. -CT CAP on 08/03/2019 did not show any significant change from August 2018 CT study. No new or progressive disease. Stable extensive sclerotic bone lesions throughout the axial and proximal appendicular skeleton. Chronic pathological mid T3 vertebral fracture. No pathologically enlarged abdominal lymph nodes. -Bone scan on 01/17/2020 showed multiple foci of progressive metastatic disease, within the mandible, left 3rd rib anteriorly, mid and lower thoracic spine, left aspect of L5 vertebral body and sternal body. -CTAP on 01/10/2020 showed unchanged posttreatment soft tissue thickening about the inferior aorta and vena cava.  No discretely enlarged abdominal or pelvic lymph nodes.  Mild left hydronephrosis and hydroureter. -PSA on 01/21/2020 increased to 2.33.  2. Left-sided stroke with right hemiparesis: -Hospitalized from 10/25/2019 through 11/20/2019 with right-sided weakness, right facial droop, MRI/MRA revealing acute left caudate/putamen infarct. MRA showed occlusion of the superior division of the left MCA M2 branch. Urine cocaine was positive. -He underwent rehab and most of his strength was regained.   PLAN:  1. 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  PSA has gone up to 2.33. -Bone scan showed progression. -Recommend discontinuing  cabazitaxel. -He has not been on any oral therapy.  I have recommended Abiraterone 1000 mg daily along with prednisone 5 mg daily.  He will take Abiraterone 500 mg daily for the 1st 1 week and increase it to full dose. -We reviewed side effects in detail.  I will plan to see him back in 2 weeks for follow-up. -I have recommended genetic testing.  I have also sent a guardant 360 CDX.  2. Bone metastasis: -We will start him back on denosumab.  Continue calcium supplements.  3. Renal insufficiency: -Creatinine increased to 1.86.  We will closely monitor.  4. Normocytic anemia: -Hemoglobin today is 11.  This is from CKD and chemotherapy.  5. Vitamin D deficiency: -Continue calcium and vitamin D supplements.  6. CVA: -Continue aspirin 81 mg and Lipitor 40 mg daily.   Orders placed this encounter:  No orders of the defined types were placed in this encounter.    Derek Jack, MD [place] [phone]   I, Milinda Antis, am acting as a scribe for Dr. Sanda Linger.  I, Derek Jack MD, have reviewed the above documentation for accuracy and completeness, and I agree with the above.

## 2020-01-21 NOTE — Patient Instructions (Signed)
[place] at [facility] Discharge Instructions  You were seen today by Dr. Delton Coombes. He went over your recent results. You received your bone injection today. You will be started on a new medication for your cancer called Zytiga; take 2 tablets daily for the first week, then take 4 tablets daily starting the following week. You will be referred to genetic counseling for further work up. Dr. Delton Coombes will see you back in 3 weeks for labs and follow up.   Thank you for choosing [place] at [facility] to provide your oncology and hematology care.  To afford each patient quality time with our provider, please arrive at least 15 minutes before your scheduled appointment time.   If you have a lab appointment with the [facility] please come in thru the Main Entrance and check in at the main information desk  You need to re-schedule your appointment should you arrive 10 or more minutes late.  We strive to give you quality time with our providers, and arriving late affects you and other patients whose appointments are after yours.  Also, if you no show three or more times for appointments you may be dismissed from the clinic at the providers discretion.     Again, thank you for choosing [facility].  Our hope is that these requests will decrease the amount of time that you wait before being seen by our physicians.       _____________________________________________________________  Should you have questions after your visit to [facility], please contact our office at [phone] between the hours of 8:00 a.m. and 4:30 p.m.  Voicemails left after 4:00 p.m. will not be returned until the following business day.  For prescription refill requests, have your pharmacy contact our office and allow 72 hours.    Cancer Center Support Programs:   > Cancer Support Group  2nd Tuesday of the month 1pm-2pm, Journey Room

## 2020-01-21 NOTE — Progress Notes (Signed)
Message received from [place] Dr. Delton Coombes. NO treatment today. Progression. Patient to start oral Zytiga and prednisone . Labs reviewed by Dr. Delton Coombes.

## 2020-01-21 NOTE — Progress Notes (Signed)
Denied tooth, jaw, and leg pain.  No recent or upcoming dental visits.  Labs reviewed.  Patient tolerated injection with no complaints voiced.  See MAR for details.  Site clean and dry with no bruising or swelling noted.  Band aid applied.  Vss with discharge and left ambulatory with no s/s of distress noted.

## 2020-01-21 NOTE — Telephone Encounter (Signed)
[name] was originally scheduled for phone visit on 01/24/20.  [name] was notified that we are unable to conduct genetics visits by telephone and was rescheduled for a telehealth genetics visit through [facility] on 02/07/20 with Raquel Sarna.  Patient was notified of date and time of appointment.

## 2020-01-22 ENCOUNTER — Telehealth (HOSPITAL_COMMUNITY): Payer: Self-pay | Admitting: Pharmacy Technician

## 2020-01-22 ENCOUNTER — Telehealth (HOSPITAL_COMMUNITY): Payer: Self-pay | Admitting: Pharmacist

## 2020-01-22 NOTE — Telephone Encounter (Signed)
Oral Oncology Patient Advocate Encounter  Received notification from [employer] that prior authorization for Zytiga (Abiraterone) is required.  PA submitted on CoverMyMeds Key [id number] Status is pending  Oral Oncology Clinic will continue to follow.  [place] Patient [place] Phone [phone] Fax [phone] 01/22/2020 11:27 AM

## 2020-01-22 NOTE — Telephone Encounter (Signed)
Oral Oncology Pharmacist Encounter  Received new prescription for Zytiga (abiraterone) for the treatment of metastatic CR prostate cancer in conjunction with prednisone 5 mg daily, planned duration until disease progression or unacceptable drug toxicity.  Labs from 01/21/20 assessed, all wnl. Note creatinine 1.86 but patient has CKD and baseline creatinine ~1.5-1.6. Prescription dose and frequency assessed. Dr. Delton Coombes to start patient with 500 mg daily for the first week then titrate to 1000 mg daily.   Current medication list in Epic reviewed, no DDIs with Zytiga identified.   Prescription has been e-scribed to the [facility] for benefits analysis and approval.  Oral Oncology Clinic will continue to follow for insurance authorization, copayment issues, initial counseling and start date.  Eddie Candle, PharmD PGY2 Hematology/Oncology Pharmacy Resident Oral Chemotherapy Navigation Clinic 01/22/2020 10:54 AM

## 2020-01-22 NOTE — Telephone Encounter (Signed)
Oral Oncology Patient Advocate Encounter  Prior Authorization for Zytiga (Abiraterone) has been approved.    PA# [id number] Effective dates: 01/22/20 through 04/25/20  Patients co-pay is $0.00.  Oral Oncology Clinic will continue to follow.   [place] Patient [place] Phone [phone] Fax [phone] 01/22/2020 11:35 AM

## 2020-01-22 NOTE — Telephone Encounter (Signed)
Oral Chemotherapy Pharmacist Encounter  Patient Education I spoke with patient for overview of new oral chemotherapy medication: Zytiga (abiraterone) for the treatment of metastatic CRPC, planned duration until disease progression or unacceptable drug toxicity.  Pt is doing well. Counseled patient on administration, dosing, side effects, monitoring, drug-food interactions, safe handling, storage, and disposal.  Patient will take 500 mg daily on an empty stomach for the first week then will titrate to 1000 mg once daily thereafter.  Side effects include but not limited to: lab changes, changes in liver function, fatigue, and arthralgias.    Reviewed with patient importance of keeping a medication schedule and plan for any missed doses.  [name] voiced understanding and appreciation. All questions answered.  Provided patient with [facility] [place] [facility] phone number. Patient knows to call the office with questions or concerns. Oral Chemotherapy Navigation Clinic will continue to follow.  Eddie Candle, PharmD PGY2 Hematology/Oncology Pharmacy Resident Oral Chemotherapy Navigation Clinic 01/22/2020 11:43 AM

## 2020-01-22 NOTE — Telephone Encounter (Signed)
Oral Oncology Patient Advocate Encounter  I spoke with Mr Jeff [name] today to set up delivery of Zytiga + Prednisone.  Address verified for shipment.  Meds will be filled through [facility] and mailed 01/22/20 for delivery 01/23/20.    [place] will call 7-10 days before next refill is due to complete adherence call and set up delivery of medication.     [place] Patient [place] Phone [phone] Fax [phone] 01/22/2020 12:13 PM

## 2020-01-24 ENCOUNTER — Other Ambulatory Visit (HOSPITAL_COMMUNITY): Payer: Self-pay | Admitting: Hematology

## 2020-01-24 ENCOUNTER — Other Ambulatory Visit (HOSPITAL_COMMUNITY): Payer: Self-pay

## 2020-01-24 ENCOUNTER — Telehealth: Payer: Medicare Other | Admitting: Genetic Counselor

## 2020-01-24 DIAGNOSIS — C61 Malignant neoplasm of prostate: Secondary | ICD-10-CM

## 2020-01-24 MED ORDER — ABIRATERONE ACETATE 250 MG PO TABS: 1000.0000 mg | ORAL_TABLET | Freq: Every day | ORAL | 0 refills | Status: DC

## 2020-02-07 ENCOUNTER — Inpatient Hospital Stay (HOSPITAL_COMMUNITY): Payer: Medicare Other

## 2020-02-07 ENCOUNTER — Ambulatory Visit (HOSPITAL_COMMUNITY): Payer: Medicare Other | Admitting: Genetic Counselor

## 2020-02-18 ENCOUNTER — Inpatient Hospital Stay (HOSPITAL_BASED_OUTPATIENT_CLINIC_OR_DEPARTMENT_OTHER): Payer: Medicare Other | Admitting: Hematology

## 2020-02-18 ENCOUNTER — Inpatient Hospital Stay (HOSPITAL_COMMUNITY): Payer: Medicare Other | Attending: Hematology

## 2020-02-18 ENCOUNTER — Inpatient Hospital Stay (HOSPITAL_COMMUNITY): Payer: Medicare Other

## 2020-02-18 ENCOUNTER — Other Ambulatory Visit: Payer: Self-pay

## 2020-02-18 VITALS — BP 96/61 | HR 90 | Temp 96.9°F | Resp 16 | Wt 162.0 lb

## 2020-02-18 DIAGNOSIS — C61 Malignant neoplasm of prostate: Secondary | ICD-10-CM

## 2020-02-18 DIAGNOSIS — N189 Chronic kidney disease, unspecified: Secondary | ICD-10-CM | POA: Insufficient documentation

## 2020-02-18 DIAGNOSIS — E538 Deficiency of other specified B group vitamins: Secondary | ICD-10-CM

## 2020-02-18 DIAGNOSIS — Z7982 Long term (current) use of aspirin: Secondary | ICD-10-CM | POA: Diagnosis not present

## 2020-02-18 DIAGNOSIS — C7951 Secondary malignant neoplasm of bone: Secondary | ICD-10-CM | POA: Insufficient documentation

## 2020-02-18 DIAGNOSIS — D631 Anemia in chronic kidney disease: Secondary | ICD-10-CM | POA: Diagnosis not present

## 2020-02-18 DIAGNOSIS — M899 Disorder of bone, unspecified: Secondary | ICD-10-CM

## 2020-02-18 DIAGNOSIS — I639 Cerebral infarction, unspecified: Secondary | ICD-10-CM | POA: Diagnosis not present

## 2020-02-18 DIAGNOSIS — E559 Vitamin D deficiency, unspecified: Secondary | ICD-10-CM | POA: Insufficient documentation

## 2020-02-18 LAB — CBC WITH DIFFERENTIAL/PLATELET
Abs Immature Granulocytes: 0.02 10*3/uL (ref 0.00–0.07)
Basophils Absolute: 0 10*3/uL (ref 0.0–0.1)
Basophils Relative: 1 %
Eosinophils Absolute: 0.1 10*3/uL (ref 0.0–0.5)
Eosinophils Relative: 2 %
HCT: 36.4 % — ABNORMAL LOW (ref 39.0–52.0)
Hemoglobin: 11.4 g/dL — ABNORMAL LOW (ref 13.0–17.0)
Immature Granulocytes: 0 %
Lymphocytes Relative: 32 %
Lymphs Abs: 2 10*3/uL (ref 0.7–4.0)
MCH: 29 pg (ref 26.0–34.0)
MCHC: 31.3 g/dL (ref 30.0–36.0)
MCV: 92.6 fL (ref 80.0–100.0)
Monocytes Absolute: 0.8 10*3/uL (ref 0.1–1.0)
Monocytes Relative: 12 %
Neutro Abs: 3.5 10*3/uL (ref 1.7–7.7)
Neutrophils Relative %: 53 %
Platelets: 351 10*3/uL (ref 150–400)
RBC: 3.93 MIL/uL — ABNORMAL LOW (ref 4.22–5.81)
RDW: 18.8 % — ABNORMAL HIGH (ref 11.5–15.5)
WBC: 6.5 10*3/uL (ref 4.0–10.5)
nRBC: 0 % (ref 0.0–0.2)

## 2020-02-18 LAB — MAGNESIUM: Magnesium: 2.4 mg/dL (ref 1.7–2.4)

## 2020-02-18 LAB — COMPREHENSIVE METABOLIC PANEL
ALT: 18 U/L (ref 0–44)
AST: 26 U/L (ref 15–41)
Albumin: 4.1 g/dL (ref 3.5–5.0)
Alkaline Phosphatase: 54 U/L (ref 38–126)
Anion gap: 10 (ref 5–15)
BUN: 45 mg/dL — ABNORMAL HIGH (ref 8–23)
CO2: 28 mmol/L (ref 22–32)
Calcium: 9.3 mg/dL (ref 8.9–10.3)
Chloride: 96 mmol/L — ABNORMAL LOW (ref 98–111)
Creatinine, Ser: 1.39 mg/dL — ABNORMAL HIGH (ref 0.61–1.24)
GFR, Estimated: 54 mL/min — ABNORMAL LOW (ref >60)
Glucose, Bld: 93 mg/dL (ref 70–99)
Potassium: 4.4 mmol/L (ref 3.5–5.1)
Sodium: 134 mmol/L — ABNORMAL LOW (ref 135–145)
Total Bilirubin: 0.5 mg/dL (ref 0.3–1.2)
Total Protein: 8.4 g/dL — ABNORMAL HIGH (ref 6.5–8.1)

## 2020-02-18 LAB — PSA: Prostatic Specific Antigen: 0.36 ng/mL (ref 0.00–4.00)

## 2020-02-18 MED ORDER — CYANOCOBALAMIN 1000 MCG/ML IJ SOLN
1000.0000 ug | Freq: Once | INTRAMUSCULAR | Status: AC
  Administered 2020-02-18: 1000 ug via INTRAMUSCULAR
  Filled 2020-02-18: qty 1

## 2020-02-18 MED ORDER — DENOSUMAB 120 MG/1.7ML ~~LOC~~ SOLN
120.0000 mg | Freq: Once | SUBCUTANEOUS | Status: AC
  Administered 2020-02-18: 120 mg via SUBCUTANEOUS
  Filled 2020-02-18: qty 1.7

## 2020-02-18 NOTE — Progress Notes (Signed)
Jeff [name], [place] [id number]   CLINIC:  Medical Oncology/Hematology  PCP:  Patient, No Pcp Per None None   REASON FOR VISIT:  Follow-up for metastatic prostate cancer  PRIOR THERAPY:  1. Bilateral orchiectomy on 02/05/2014. 2. Docetaxel x 6 cycles from 03/14/2014 to 06/26/2014. 3. Cabazitaxel x 55 cycles from 05/03/2016 to 12/17/2019.  NGS Results: Guardant 360 MSI high not detected  CURRENT THERAPY: Zytiga daily  BRIEF ONCOLOGIC HISTORY:  Oncology History  Malignant neoplasm of prostate ([place])  01/26/2014 Tumor Marker   PSA > 5000   01/28/2014 Initial Biopsy   Metastatic adenocarcinoma of prostate   02/05/2014 Surgery   Bilateral orchiectomy by Dr. Junious Silk   02/26/2014 Tumor Marker   PSA = 399.5   03/14/2014 Tumor Marker   PSA= 89.51   03/14/2014 - 06/26/2014 Chemotherapy   Docetaxel 75 mg/kg every 21 days x 6 cycles   04/24/2014 Tumor Marker   PSA- 19.89   07/23/2014 Procedure   Nephrostomy tube removed by IR, Dr. Geroge Baseman   07/24/2014 Tumor Marker   PSA= 6.15   10/16/2014 Tumor Marker   PSA: 3.54    01/08/2015 Tumor Marker   PSA: 2.13    01/17/2015 Imaging   CT CAP-  Massive pelvic and retroperitoneal lymphadenopathy noted on the prior study has nearly completely resolved, now with only a small amount of residual amorphous soft tissue predominantly around the the infrarenal abdominal aorta.   01/17/2015 Imaging   Bone scan- Widespread osseous metastatic disease with multiple foci of increased activity throughout the skeleton, corresponding with the findings on the prior CT from October, 2015.   04/11/2015 Tumor Marker   PSA: 1.87    07/21/2015 Imaging   Bone scan- Bony metastatic disease again noted at multiple sites, stable from prior study. No progression of bony metastatic disease is demonstrable on this study.   07/25/2015 Imaging   CT CAP- Stable matted soft tissue density in the retroperitoneum and  extraperitoneal pelvis consistent with treated disease. No recurrent lymphadenopathy.  No pulmonary metastatic disease. Diffuse stable sclerotic metastatic bone disease.   01/14/2016 Imaging   Bone scan-  Multiple sites of abnormal increased tracer localization involving BILATERAL ribs, T11, T12, questionably RIGHT scapula and LEFT iliac bone suspicious for osseous metastases.   01/23/2016 Imaging   CT CAP- 1. Similar widespread osseous metastasis. 2. Similar soft tissue thickening within the retroperitoneum of the abdomen. Improved left pelvic side wall soft tissue thickening. These are consistent with sites of treated disease. No well-defined adenopathy. 3. No new sites of disease.   05/03/2016 -  Chemotherapy   Jevtana every 21 days    05/03/2016 - 12/17/2019 Chemotherapy   The patient had pegfilgrastim (NEULASTA) injection 6 mg, 6 mg, Subcutaneous,  Once, 1 of 1 cycle Administration: 6 mg (05/05/2016) pegfilgrastim (NEULASTA ONPRO KIT) injection 6 mg, 6 mg, Subcutaneous, Once, 54 of 59 cycles Administration: 6 mg (05/24/2016), 6 mg (06/14/2016), 6 mg (07/07/2016), 6 mg (07/28/2016), 6 mg (08/18/2016), 6 mg (09/08/2016), 6 mg (09/29/2016), 6 mg (10/20/2016), 6 mg (11/10/2016), 6 mg (02/14/2017), 6 mg (03/07/2017), 6 mg (03/28/2017), 6 mg (04/20/2017), 6 mg (05/11/2017), 6 mg (06/27/2017), 6 mg (08/30/2017), 6 mg (09/20/2017), 6 mg (10/11/2017), 6 mg (11/02/2017), 6 mg (08/09/2017), 6 mg (11/23/2017), 6 mg (12/14/2017), 6 mg (01/09/2018), 6 mg (01/31/2018), 6 mg (04/04/2018), 6 mg (04/27/2018), 6 mg (05/18/2018), 6 mg (06/08/2018), 6 mg (06/29/2018), 6 mg (07/20/2018), 6 mg (08/10/2018), 6 mg (09/01/2018), 6 mg (09/22/2018),  6 mg (10/13/2018), 6 mg (11/03/2018), 6 mg (11/24/2018), 6 mg (12/15/2018), 6 mg (01/05/2019), 6 mg (01/26/2019), 6 mg (02/16/2019), 6 mg (03/12/2019), 6 mg (04/02/2019), 6 mg (04/23/2019), 6 mg (05/14/2019), 6 mg (06/04/2019), 6 mg (06/25/2019), 6 mg (07/16/2019), 6 mg (08/16/2019), 6 mg (09/06/2019), 6 mg (09/27/2019), 6 mg  (10/18/2019), 6 mg (12/17/2019) cabazitaxel (JEVTANA) 41 mg in dextrose 5 % 250 mL chemo infusion, 20 mg/m2 = 41 mg (100 % of original dose 20 mg/m2), Intravenous,  Once, 55 of 60 cycles Dose modification: 20 mg/m2 (original dose 20 mg/m2, Cycle 1, Reason: Dose not tolerated, Comment: recommended by Data to use 49m/m), 20 mg/m2 (original dose 20 mg/m2, Cycle 37, Reason: Other (see comments), Comment: Changed to new orderable), 21 mg/m2 (original dose 20 mg/m2, Cycle 40, Reason: Other (see comments)), 20 mg/m2 (original dose 20 mg/m2, Cycle 40, Reason: Other (see comments)) Administration: 41 mg (05/03/2016), 41 mg (05/24/2016), 41 mg (06/14/2016), 41 mg (07/07/2016), 41 mg (07/28/2016), 41 mg (08/18/2016), 41 mg (09/08/2016), 41 mg (09/29/2016), 41 mg (10/20/2016), 41 mg (11/10/2016), 41 mg (02/14/2017), 41 mg (03/07/2017), 41 mg (03/28/2017), 41 mg (04/20/2017), 41 mg (05/11/2017), 41 mg (06/27/2017), 41 mg (08/30/2017), 41 mg (09/20/2017), 41 mg (10/11/2017), 41 mg (11/02/2017), 41 mg (08/09/2017), 41 mg (11/23/2017), 41 mg (12/14/2017), 41 mg (01/09/2018), 41 mg (01/31/2018), 41 mg (02/21/2018), 41 mg (04/04/2018), 41 mg (04/27/2018), 41 mg (05/18/2018), 41 mg (06/08/2018), 41 mg (06/29/2018), 41 mg (07/20/2018), 41 mg (08/10/2018), 41 mg (09/01/2018), 41 mg (09/22/2018), 41 mg (10/13/2018), 41 mg (11/03/2018), 41 mg (11/24/2018), 41 mg (12/15/2018), 41 mg (01/05/2019), 41 mg (01/26/2019), 41 mg (02/16/2019), 41 mg (03/12/2019), 41 mg (04/02/2019), 41 mg (04/23/2019), 41 mg (05/14/2019), 41 mg (06/04/2019), 41 mg (06/25/2019), 41 mg (07/16/2019), 41 mg (08/16/2019), 41 mg (09/06/2019), 41 mg (09/27/2019), 41 mg (10/18/2019), 41 mg (12/17/2019)  for chemotherapy treatment.    09/06/2016 Imaging   Restaging CT C/A/P: IMPRESSION: 1. Similar appearance of widespread sclerotic bone metastases. 2. No change in soft tissue thickening within the retroperitoneum and left pelvic sidewall. No well defined adenopathy identified. 3. No new sites of disease 4. Aortic  Atherosclerosis (ICD10-I70.0). Coronary artery calcifications noted. 5. 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 6. Stable 9 mm pancreatic cystic lesion. Favor pseudocyst. Indolent neoplasm may look similar. Attention on follow-up imaging.   09/06/2016 Imaging   Bone Scan: IMPRESSION: In this patient with known diffuse sclerotic metastatic disease, bone scan does not reveal progression of radiotracer uptake. Several of the areas of previously demonstrated radiotracer uptake appear less prominent.  Change in appearance of radiotracer uptake involving the mandible now more notable on the right and previously more notable on the left may reflect result of dental disease rather than metastatic disease.    04/08/2017 Tumor Marker   PSA 0.89   01/07/2020 Genetic Testing   Guardant 360 CDx Results:         CANCER STAGING: Cancer Staging No matching staging information was found for the patient.  INTERVAL HISTORY:  Jeff [name] a 74y.o. male, returns for routine follow-up of his metastatic prostate cancer. DShelleywas last seen on 01/21/2020.   Today he reports feeling okay. He started taking Zytiga 2 tablets for the first 1 week and now is taking 4 tablets in the morning on an empty stomach. He is taking prednisone daily. He is tolerating it well and denies having any joint aches or hot flashes.   REVIEW OF SYSTEMS:  Review of Systems  Constitutional: Positive for fatigue (75%). Negative for appetite change.  Endocrine: Negative for hot flashes.  Musculoskeletal: Negative for arthralgias.  All other systems reviewed and are negative.   PAST MEDICAL/SURGICAL HISTORY:  Past Medical History:  Diagnosis Date   Acute renal failure ([place]) 01/26/2014   Alcohol abuse    Anemia of chronic disease 01/26/2014   Bilateral hydronephrosis 01/26/2014   History of cocaine abuse ([place]) 01/26/2014   Homelessness  01/31/2014   Prostate cancer metastatic to multiple sites ([place]) 01/30/2014   Sepsis ([place])    UTI (lower urinary tract infection)    Past Surgical History:  Procedure Laterality Date   ORCHIECTOMY Bilateral 02/05/2014   Procedure: BILATERAL ORCHIECTOMY;  Surgeon: Festus Aloe, MD;  Location: [facility];  Service: Urology;  Laterality: Bilateral;   PERCUTANEOUS NEPHROSTOMY Bilateral    IR Dr. Junious Silk changed on 04/22/2014   PORT-A-CATH REMOVAL Right 08/08/2017   Procedure: REMOVAL PORT-A-CATH RIGHT CHEST (PROCEDURE #2);  Surgeon: Aviva Signs, MD;  Location: [facility] OR[facility];  Service: General;  Laterality: Right;   PORTACATH PLACEMENT Right 03/12/14   PORTACATH PLACEMENT Left 08/08/2017   Procedure: INSERTION POWER PORT WITH ATTACHED CATHETER LEFT SUBCLAVIAN (PROCEDURE #1);  Surgeon: Aviva Signs, MD;  Location: [facility];  Service: General;  Laterality: Left;    SOCIAL HISTORY:  Social History   Socioeconomic History   Marital status: Single    Spouse name: Not on file   Number of children: 1   Years of education: 9th   Highest education level: Not on file  Occupational History   Occupation: disabilty    Employer: [employer]  Tobacco Use   Smoking status: Current Every Day Smoker    Packs/day: 0.25    Years: 55.00    Pack years: 13.75    Types: Cigarettes   Smokeless tobacco: Never Used  Vaping Use   Vaping Use: Never used  Substance and Sexual Activity   Alcohol use: Yes    Alcohol/week: 1.0 standard drink    Types: 1 Cans of beer per week   Drug use: Yes    Types: Cocaine    Comment: last used Cocaine 01/25/14   Sexual activity: Yes  Other Topics Concern   Not on file  Social History Narrative   Lives at [employer] 9th grade   1 son, lives in [place], [place]   Can read and write in native language            Social Determinants of Health   Financial Resource Strain:    Difficulty of Paying Living Expenses: Not on file  Food  Insecurity:    Worried About Charity fundraiser in the Last Year: Not on file   [employer] of Food in the Last Year: Not on file  Transportation Needs:    Lack of Transportation (Medical): Not on file   Lack of Transportation (Non-Medical): Not on file  Physical Activity:    Days of Exercise per Week: Not on file   Minutes of Exercise per Session: Not on file  Stress:    Feeling of Stress : Not on file  Social Connections:    Frequency of Communication with Friends and Family: Not on file   Frequency of Social Gatherings with Friends and Family: Not on file   Attends Religious Services: Not on file   Active Member of Clubs or Organizations: Not on file   Attends Archivist Meetings: Not on file   Marital Status: Not  on file  Intimate Partner Violence:    Fear of Current or Ex-Partner: Not on file   Emotionally Abused: Not on file   Physically Abused: Not on file   Sexually Abused: Not on file    FAMILY HISTORY:  Family History  Problem Relation Age of Onset   Cancer Brother 51    CURRENT MEDICATIONS:  Current Outpatient Medications  Medication Sig Dispense Refill   abiraterone acetate (ZYTIGA) 250 MG tablet Take 4 tablets (1,000 mg total) by mouth daily. Take on an empty stomach 1 hour before or 2 hours after a meal 120 tablet 0   aspirin EC 81 MG EC tablet Take 1 tablet (81 mg total) by mouth daily. Swallow whole. 30 tablet 11   atorvastatin (LIPITOR) 40 MG tablet Take 1 tablet (40 mg total) by mouth daily.     famotidine (PEPCID) 10 MG tablet Take 1 tablet (10 mg total) by mouth daily as needed for heartburn or indigestion.     Menthol-Methyl Salicylate (MUSCLE RUB) 10-15 % CREA Apply 1 application topically 2 (two) times daily as needed for muscle pain. To back and hips  0   midodrine (PROAMATINE) 10 MG tablet Take 1 tablet (10 mg total) by mouth 3 (three) times daily with meals.     predniSONE (DELTASONE) 5 MG tablet Take 1 tablet (5 mg  total) by mouth daily with breakfast. 30 tablet 6   simethicone (MYLICON) 80 MG chewable tablet Chew 1 tablet (80 mg total) by mouth 4 (four) times daily as needed for flatulence. 30 tablet 0   No current facility-administered medications for this visit.   Facility-Administered Medications Ordered in Other Visits  Medication Dose Route Frequency Provider Last Rate Last Admin   cyanocobalamin ((VITAMIN B-12)) injection 1,000 mcg  1,000 mcg Intramuscular Once Kefalas, Thomas S, PA-C        ALLERGIES:  No Active Allergies  PHYSICAL EXAM:  Performance status (ECOG): 1 - Symptomatic but completely ambulatory  Vitals:   02/18/20 1038  BP: 96/61  Pulse: 90  Resp: 16  Temp: (!) 96.9 F (36.1 C)  SpO2: 99%   Wt Readings from Last 3 Encounters:  02/18/20 162 lb (73.5 kg)  01/21/20 154 lb 3.2 oz (69.9 kg)  01/10/20 166 lb 11.2 oz (75.6 kg)   Physical Exam Vitals reviewed.  Constitutional:      Appearance: Normal appearance.  Cardiovascular:     Rate and Rhythm: Normal rate and regular rhythm.     Pulses: Normal pulses.     Heart sounds: Normal heart sounds.  Pulmonary:     Effort: Pulmonary effort is normal.     Breath sounds: Normal breath sounds.  Musculoskeletal:     Right lower leg: No edema.     Left lower leg: No edema.  Neurological:     General: No focal deficit present.     Mental Status: He is alert and oriented to person, place, and time.  Psychiatric:        Mood and Affect: Mood normal.        Behavior: Behavior normal.      LABORATORY DATA:  I have reviewed the labs as listed.  CBC Latest Ref Rng & Units 02/18/2020 01/21/2020 01/10/2020  WBC 4.0 - 10.5 K/uL 6.5 5.4 8.7  Hemoglobin 13.0 - 17.0 g/dL 11.4(L) 11.0(L) 8.5(L)  Hematocrit 39 - 52 % 36.4(L) 35.8(L) 27.9(L)  Platelets 150 - 400 K/uL 351 348 343   CMP Latest Ref Rng & Units 02/18/2020  01/21/2020 01/10/2020  Glucose 70 - 99 mg/dL 93 98 106(H)  BUN 8 - 23 mg/dL 45(H) 23 18  Creatinine 0.61 - 1.24  mg/dL 1.39(H) 1.86(H) 1.48(H)  Sodium 135 - 145 mmol/L 134(L) 136 133(L)  Potassium 3.5 - 5.1 mmol/L 4.4 4.5 4.0  Chloride 98 - 111 mmol/L 96(L) 101 101  CO2 22 - 32 mmol/L '28 23 23  ' Calcium 8.9 - 10.3 mg/dL 9.3 10.0 9.1  Total Protein 6.5 - 8.1 g/dL 8.4(H) 9.1(H) 7.4  Total Bilirubin 0.3 - 1.2 mg/dL 0.5 0.5 0.4  Alkaline Phos 38 - 126 U/L 54 60 51  AST 15 - 41 U/L '26 25 23  ' ALT 0 - 44 U/L '18 15 24   ' Lab Results  Component Value Date   PSA 1.18 09/06/2016   PSA 2.61 07/12/2016   PSA 2.86 07/07/2016    DIAGNOSTIC IMAGING:  I have independently reviewed the scans and discussed with the patient. No results found.   ASSESSMENT:  1. 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 -6 cycles of docetaxel from 03/14/2014 through 06/26/2014. -Cabazitaxel every 3 weeks started on 05/03/2016. -Baseline PSA between 0.7 and 1. -Bone scan on 08/03/2019 showed uptake in the right mandible, slightly more progressive than prior study in April 2019. Multiple additional studies in the ribs and pelvis are not identified. Uptake in the left maxilla, question dental disease. -CT CAP on 08/03/2019 did not show any significant change from August 2018 CT study. No new or progressive disease. Stable extensive sclerotic bone lesions throughout the axial and proximal appendicular skeleton. Chronic pathological mid T3 vertebral fracture. No pathologically enlarged abdominal lymph nodes. -Bone scan on 01/17/2020 showed multiple foci of progressive metastatic disease, within the mandible, left 3rd rib anteriorly, mid and lower thoracic spine, left aspect of L5 vertebral body and sternal body. -CTAP on 01/10/2020 showed unchanged posttreatment soft tissue thickening about the inferior aorta and vena cava.  No discretely enlarged abdominal or pelvic lymph nodes.  Mild left hydronephrosis and hydroureter. -PSA on 01/21/2020 increased to 2.33. -Abiraterone with  prednisone started around 01/24/2020. -Guardant 360 showed MSI high not detected.  No other relevant targetable mutations.  2. Left-sided stroke with right hemiparesis: -Hospitalized from 10/25/2019 through 11/20/2019 with right-sided weakness, right facial droop, MRI/MRA revealing acute left caudate/putamen infarct. MRA showed occlusion of the superior division of the left MCA M2 branch. Urine cocaine was positive. -He underwent rehab and most of his strength was regained.   PLAN:  1. 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  He is taking prednisone 5 mg daily. -Discussed results of the guardant 360 which did not show any targetable mutations. -Reviewed his labs.  LFTs show slightly elevated total protein of 8.4 with normal bilirubin. -PSA already improved to 0.36. -Continue Abiraterone 1000 mg daily along with prednisone 5 mg daily.  RTC 3 weeks with labs. -We will consider genetic testing.  2. Bone metastasis: -Continue denosumab.  Calcium is 9.3.  Continue calcium supplements.  3. Renal insufficiency: -Creatinine improved to 1.39 from 1.86.  4. Normocytic anemia: -This is from combination of CKD and chemotherapy.  Hemoglobin improved 11.4.  5. Vitamin D deficiency: -Continue vitamin D supplements.  6. CVA: -Continue aspirin 81 mg and Lipitor 40 mg daily.   Orders placed this encounter:  No orders of the defined types were placed in this encounter.  Derek Jack, MD [place] [phone]   I, Milinda Antis, am acting as a scribe for Dr. Sanda Linger.  I, Derek Jack MD, have reviewed the above documentation for accuracy and completeness, and I agree with the above.

## 2020-02-18 NOTE — Progress Notes (Signed)
Patient was assessed by Dr. Katragadda and labs have been reviewed.  Patient is okay to proceed with treatment today. Primary RN and pharmacy aware.   

## 2020-02-18 NOTE — Progress Notes (Signed)
Patient taking calcium as directed.  Denied tooth, jaw, and leg pain.  No recent or upcoming dental visits.  Patient tolerated injection with no complaints.  Site clean and dry with no bruising or swelling noted at site.  Band aid applied.  VSS with discharge and left ambulatory with no s/s of distress noted.  Pt tolerated B12 injection with no complaints.  Site clean and dry, band aid applied.

## 2020-02-18 NOTE — Patient Instructions (Signed)
[place] at [facility] Discharge Instructions  You were seen today by Dr. Delton Coombes. He went over your recent results. You received your 2 injections today; continue receiving your injections every month. Dr. Delton Coombes will see you back in 3 weeks for labs and follow up.   Thank you for choosing [place] at [facility] to provide your oncology and hematology care.  To afford each patient quality time with our provider, please arrive at least 15 minutes before your scheduled appointment time.   If you have a lab appointment with the [facility] please come in thru the Main Entrance and check in at the main information desk  You need to re-schedule your appointment should you arrive 10 or more minutes late.  We strive to give you quality time with our providers, and arriving late affects you and other patients whose appointments are after yours.  Also, if you no show three or more times for appointments you may be dismissed from the clinic at the providers discretion.     Again, thank you for choosing [facility].  Our hope is that these requests will decrease the amount of time that you wait before being seen by our physicians.       _____________________________________________________________  Should you have questions after your visit to [facility], please contact our office at [phone] between the hours of 8:00 a.m. and 4:30 p.m.  Voicemails left after 4:00 p.m. will not be returned until the following business day.  For prescription refill requests, have your pharmacy contact our office and allow 72 hours.    Cancer Center Support Programs:   > Cancer Support Group  2nd Tuesday of the month 1pm-2pm, Journey Room

## 2020-02-25 ENCOUNTER — Other Ambulatory Visit: Payer: Self-pay

## 2020-02-25 ENCOUNTER — Encounter: Payer: Self-pay | Admitting: Cardiology

## 2020-02-25 ENCOUNTER — Ambulatory Visit (INDEPENDENT_AMBULATORY_CARE_PROVIDER_SITE_OTHER): Payer: Medicare Other | Admitting: Cardiology

## 2020-02-25 VITALS — BP 98/68 | HR 76 | Ht 66.0 in | Wt 153.0 lb

## 2020-02-25 DIAGNOSIS — I5022 Chronic systolic (congestive) heart failure: Secondary | ICD-10-CM | POA: Diagnosis not present

## 2020-02-25 NOTE — Progress Notes (Signed)
Clinical Summary [name] is a 74 y.o.male seen today for follow up of the following medical problems.   1. Chronic systolic HF - echo 0/3500 LVEF 35%, apical hypokinesis - his LV dysfunction was noted during an admission for acute CVA. Has not had repeat study.  - no SOB, no DOE. No LE edema, no chest pains.     2. Hypotension - has been on midodrine. Not any pills 3 times a day, he is not sure what he is taking.    3. Metastatic prostate cancer w bone mets - followed by oncology  4. Polysubstance abuse - cocaine positive during 10/2019 admisson Past Medical History:  Diagnosis Date  . Acute renal failure ([place]) 01/26/2014  . Alcohol abuse   . Anemia of chronic disease 01/26/2014  . Bilateral hydronephrosis 01/26/2014  . History of cocaine abuse ([place]) 01/26/2014  . Homelessness 01/31/2014  . Prostate cancer metastatic to multiple sites ([place]) 01/30/2014  . Sepsis ([place])   . UTI (lower urinary tract infection)      No Active Allergies   Current Outpatient Medications  Medication Sig Dispense Refill  . abiraterone acetate (ZYTIGA) 250 MG tablet Take 4 tablets (1,000 mg total) by mouth daily. Take on an empty stomach 1 hour before or 2 hours after a meal 120 tablet 0  . aspirin EC 81 MG EC tablet Take 1 tablet (81 mg total) by mouth daily. Swallow whole. 30 tablet 11  . atorvastatin (LIPITOR) 40 MG tablet Take 1 tablet (40 mg total) by mouth daily.    . famotidine (PEPCID) 10 MG tablet Take 1 tablet (10 mg total) by mouth daily as needed for heartburn or indigestion.    . Menthol-Methyl Salicylate (MUSCLE RUB) 10-15 % CREA Apply 1 application topically 2 (two) times daily as needed for muscle pain. To back and hips  0  . midodrine (PROAMATINE) 10 MG tablet Take 1 tablet (10 mg total) by mouth 3 (three) times daily with meals.    . predniSONE (DELTASONE) 5 MG tablet Take 1 tablet (5 mg total) by mouth daily with breakfast. 30 tablet 6  . simethicone (MYLICON) 80 MG  chewable tablet Chew 1 tablet (80 mg total) by mouth 4 (four) times daily as needed for flatulence. 30 tablet 0   No current facility-administered medications for this visit.   Facility-Administered Medications Ordered in Other Visits  Medication Dose Route Frequency Provider Last Rate Last Admin  . cyanocobalamin ((VITAMIN B-12)) injection 1,000 mcg  1,000 mcg Intramuscular Once Baird Cancer, PA-C         Past Surgical History:  Procedure Laterality Date  . ORCHIECTOMY Bilateral 02/05/2014   Procedure: BILATERAL ORCHIECTOMY;  Surgeon: Festus Aloe, MD;  Location: [facility];  Service: Urology;  Laterality: Bilateral;  . PERCUTANEOUS NEPHROSTOMY Bilateral    IR Dr. Junious Silk changed on 04/22/2014  . PORT-A-CATH REMOVAL Right 08/08/2017   Procedure: REMOVAL PORT-A-CATH RIGHT CHEST (PROCEDURE #2);  Surgeon: Aviva Signs, MD;  Location: [facility];  Service: General;  Laterality: Right;  . PORTACATH PLACEMENT Right 03/12/14  . PORTACATH PLACEMENT Left 08/08/2017   Procedure: INSERTION POWER PORT WITH ATTACHED CATHETER LEFT SUBCLAVIAN (PROCEDURE #1);  Surgeon: Aviva Signs, MD;  Location: [facility];  Service: General;  Laterality: Left;     No Active Allergies    Family History  Problem Relation Age of Onset  . Cancer Brother 7     Social History [name] reports that he has been smoking cigarettes. He  has a 13.75 pack-year smoking history. He has never used smokeless tobacco. [name] reports current alcohol use of about 1.0 standard drink of alcohol per week.   Review of Systems CONSTITUTIONAL: No weight loss, fever, chills, weakness or fatigue.  HEENT: Eyes: No visual loss, blurred vision, double vision or yellow sclerae.No hearing loss, sneezing, congestion, runny nose or sore throat.  SKIN: No rash or itching.  CARDIOVASCULAR: per hpi RESPIRATORY: No shortness of breath, cough or sputum.  GASTROINTESTINAL: No anorexia, nausea, vomiting or diarrhea. No abdominal  pain or blood.  GENITOURINARY: No burning on urination, no polyuria NEUROLOGICAL: No headache, dizziness, syncope, paralysis, ataxia, numbness or tingling in the extremities. No change in bowel or bladder control.  MUSCULOSKELETAL: No muscle, back pain, joint pain or stiffness.  LYMPHATICS: No enlarged nodes. No history of splenectomy.  PSYCHIATRIC: No history of depression or anxiety.  ENDOCRINOLOGIC: No reports of sweating, cold or heat intolerance. No polyuria or polydipsia.  [name] Kitchen   Physical Examination Today's Vitals   02/25/20 1327 02/25/20 1330  BP: 98/64 98/68  Pulse: 76   SpO2: 96%   Weight: 153 lb (69.4 kg)   Height: 5\' 6"  (1.676 m)    Body mass index is 24.69 kg/m.  Gen: resting comfortably, no acute distress HEENT: no scleral icterus, pupils equal round and reactive, no palptable cervical adenopathy,  CV: RRR, no mr/g, no jvd Resp: Clear to auscultation bilaterally GI: abdomen is soft, non-tender, non-distended, normal bowel sounds, no hepatosplenomegaly MSK: extremities are warm, no edema.  Skin: warm, no rash Neuro:  no focal deficits Psych: appropriate affect    Assessment and Plan  1. Chronic systolic HF - LVEF 94-80% during admission with CVA. Could be a stress induced CM, particularly since his apex was hypokinetic. Other etiologies could include cocaine induced as he tested + during that admission, ICM - repeat echo - limited medicatino options due to chronic hypotension on midodrine - with poor medication compliance, metastatic prostate cancer, polysubstance abuse would be hesitant to consider cath. In general we may be limited in what we can offer.       Arnoldo Lenis, M.D.

## 2020-02-25 NOTE — Patient Instructions (Signed)
Medication Instructions:  Your physician recommends that you continue on your current medications as directed. Please refer to the Current Medication list given to you today.  *If you need a refill on your cardiac medications before your next appointment, please call your pharmacy*   Lab Work: None today If you have labs (blood work) drawn today and your tests are completely normal, you will receive your results only by: [name] Kitchen MyChart Message (if you have MyChart) OR . A paper copy in the mail If you have any lab test that is abnormal or we need to change your treatment, we will call you to review the results.   Testing/Procedures: None today   Follow-Up: At [facility], you and your health needs are our priority.  As part of our continuing mission to provide you with exceptional heart care, we have created designated Provider Care Teams.  These Care Teams include your primary Cardiologist (physician) and Advanced Practice Providers (APPs -  Physician Assistants and Nurse Practitioners) who all work together to provide you with the care you need, when you need it.  We recommend signing up for the patient portal called "MyChart".  Sign up information is provided on this After Visit Summary.  MyChart is used to connect with patients for Virtual Visits (Telemedicine).  Patients are able to view lab/test results, encounter notes, upcoming appointments, etc.  Non-urgent messages can be sent to your provider as well.   To learn more about what you can do with MyChart, go to [email or web address].    Your next appointment:   1 month(s)  The format for your next appointment:   In Person  Provider:   You will see one of the following Advanced Practice Providers on your designated Care Team:    Bernerd Pho, PA-C   Ermalinda Barrios, PA-C    Other Instructions Nurse apt to bring meds     Thank you for choosing [facility] !

## 2020-03-10 ENCOUNTER — Other Ambulatory Visit (HOSPITAL_COMMUNITY): Payer: Self-pay | Admitting: Hematology

## 2020-03-10 ENCOUNTER — Inpatient Hospital Stay (HOSPITAL_COMMUNITY): Payer: Medicare Other | Attending: Hematology

## 2020-03-10 ENCOUNTER — Other Ambulatory Visit: Payer: Self-pay

## 2020-03-10 ENCOUNTER — Ambulatory Visit: Payer: Medicare Other | Admitting: Neurology

## 2020-03-10 DIAGNOSIS — C61 Malignant neoplasm of prostate: Secondary | ICD-10-CM | POA: Diagnosis present

## 2020-03-10 DIAGNOSIS — D631 Anemia in chronic kidney disease: Secondary | ICD-10-CM | POA: Insufficient documentation

## 2020-03-10 DIAGNOSIS — N189 Chronic kidney disease, unspecified: Secondary | ICD-10-CM | POA: Diagnosis not present

## 2020-03-10 DIAGNOSIS — E559 Vitamin D deficiency, unspecified: Secondary | ICD-10-CM | POA: Diagnosis not present

## 2020-03-10 DIAGNOSIS — M899 Disorder of bone, unspecified: Secondary | ICD-10-CM

## 2020-03-10 DIAGNOSIS — I69351 Hemiplegia and hemiparesis following cerebral infarction affecting right dominant side: Secondary | ICD-10-CM | POA: Diagnosis not present

## 2020-03-10 DIAGNOSIS — Z23 Encounter for immunization: Secondary | ICD-10-CM | POA: Insufficient documentation

## 2020-03-10 DIAGNOSIS — C7951 Secondary malignant neoplasm of bone: Secondary | ICD-10-CM | POA: Diagnosis not present

## 2020-03-10 DIAGNOSIS — Z7982 Long term (current) use of aspirin: Secondary | ICD-10-CM | POA: Diagnosis not present

## 2020-03-10 LAB — CBC WITH DIFFERENTIAL/PLATELET
Abs Immature Granulocytes: 0.01 10*3/uL (ref 0.00–0.07)
Basophils Absolute: 0 10*3/uL (ref 0.0–0.1)
Basophils Relative: 0 %
Eosinophils Absolute: 0.1 10*3/uL (ref 0.0–0.5)
Eosinophils Relative: 2 %
HCT: 36.6 % — ABNORMAL LOW (ref 39.0–52.0)
Hemoglobin: 11.2 g/dL — ABNORMAL LOW (ref 13.0–17.0)
Immature Granulocytes: 0 %
Lymphocytes Relative: 41 %
Lymphs Abs: 1.6 10*3/uL (ref 0.7–4.0)
MCH: 28.4 pg (ref 26.0–34.0)
MCHC: 30.6 g/dL (ref 30.0–36.0)
MCV: 92.7 fL (ref 80.0–100.0)
Monocytes Absolute: 0.5 10*3/uL (ref 0.1–1.0)
Monocytes Relative: 13 %
Neutro Abs: 1.8 10*3/uL (ref 1.7–7.7)
Neutrophils Relative %: 44 %
Platelets: 263 10*3/uL (ref 150–400)
RBC: 3.95 MIL/uL — ABNORMAL LOW (ref 4.22–5.81)
RDW: 17.3 % — ABNORMAL HIGH (ref 11.5–15.5)
WBC: 4.1 10*3/uL (ref 4.0–10.5)
nRBC: 0 % (ref 0.0–0.2)

## 2020-03-10 LAB — COMPREHENSIVE METABOLIC PANEL
ALT: 14 U/L (ref 0–44)
AST: 23 U/L (ref 15–41)
Albumin: 3.4 g/dL — ABNORMAL LOW (ref 3.5–5.0)
Alkaline Phosphatase: 64 U/L (ref 38–126)
Anion gap: 8 (ref 5–15)
BUN: 16 mg/dL (ref 8–23)
CO2: 24 mmol/L (ref 22–32)
Calcium: 9.2 mg/dL (ref 8.9–10.3)
Chloride: 104 mmol/L (ref 98–111)
Creatinine, Ser: 1.41 mg/dL — ABNORMAL HIGH (ref 0.61–1.24)
GFR, Estimated: 53 mL/min — ABNORMAL LOW (ref >60)
Glucose, Bld: 62 mg/dL — ABNORMAL LOW (ref 70–99)
Potassium: 3.4 mmol/L — ABNORMAL LOW (ref 3.5–5.1)
Sodium: 136 mmol/L (ref 135–145)
Total Bilirubin: 0.3 mg/dL (ref 0.3–1.2)
Total Protein: 7.6 g/dL (ref 6.5–8.1)

## 2020-03-10 LAB — PSA: Prostatic Specific Antigen: 0.11 ng/mL (ref 0.00–4.00)

## 2020-03-12 ENCOUNTER — Other Ambulatory Visit: Payer: Self-pay

## 2020-03-12 ENCOUNTER — Inpatient Hospital Stay (HOSPITAL_BASED_OUTPATIENT_CLINIC_OR_DEPARTMENT_OTHER): Payer: Medicare Other | Admitting: Hematology

## 2020-03-12 ENCOUNTER — Other Ambulatory Visit (HOSPITAL_COMMUNITY): Payer: Self-pay

## 2020-03-12 ENCOUNTER — Encounter (HOSPITAL_COMMUNITY): Payer: Self-pay | Admitting: Hematology

## 2020-03-12 ENCOUNTER — Other Ambulatory Visit (HOSPITAL_COMMUNITY): Payer: Self-pay | Admitting: *Deleted

## 2020-03-12 ENCOUNTER — Other Ambulatory Visit (HOSPITAL_COMMUNITY): Payer: Medicare Other

## 2020-03-12 VITALS — BP 109/87 | HR 55 | Temp 96.7°F | Resp 18 | Wt 158.8 lb

## 2020-03-12 DIAGNOSIS — Z23 Encounter for immunization: Secondary | ICD-10-CM | POA: Diagnosis not present

## 2020-03-12 DIAGNOSIS — Z7982 Long term (current) use of aspirin: Secondary | ICD-10-CM | POA: Diagnosis not present

## 2020-03-12 DIAGNOSIS — N189 Chronic kidney disease, unspecified: Secondary | ICD-10-CM | POA: Diagnosis not present

## 2020-03-12 DIAGNOSIS — E875 Hyperkalemia: Secondary | ICD-10-CM

## 2020-03-12 DIAGNOSIS — R7989 Other specified abnormal findings of blood chemistry: Secondary | ICD-10-CM

## 2020-03-12 DIAGNOSIS — E538 Deficiency of other specified B group vitamins: Secondary | ICD-10-CM | POA: Diagnosis not present

## 2020-03-12 DIAGNOSIS — E559 Vitamin D deficiency, unspecified: Secondary | ICD-10-CM

## 2020-03-12 DIAGNOSIS — M899 Disorder of bone, unspecified: Secondary | ICD-10-CM

## 2020-03-12 DIAGNOSIS — C7951 Secondary malignant neoplasm of bone: Secondary | ICD-10-CM | POA: Diagnosis not present

## 2020-03-12 DIAGNOSIS — D631 Anemia in chronic kidney disease: Secondary | ICD-10-CM | POA: Diagnosis not present

## 2020-03-12 DIAGNOSIS — I69351 Hemiplegia and hemiparesis following cerebral infarction affecting right dominant side: Secondary | ICD-10-CM | POA: Diagnosis not present

## 2020-03-12 DIAGNOSIS — C61 Malignant neoplasm of prostate: Secondary | ICD-10-CM

## 2020-03-12 MED ORDER — PREDNISONE 5 MG PO TABS: 5.0000 mg | ORAL_TABLET | Freq: Every day | ORAL | 6 refills | Status: DC

## 2020-03-12 MED ORDER — DENOSUMAB 120 MG/1.7ML ~~LOC~~ SOLN: 120.0000 mg | Freq: Once | SUBCUTANEOUS | Status: DC

## 2020-03-12 MED ORDER — INFLUENZA VAC A&B SA ADJ QUAD 0.5 ML IM PRSY
PREFILLED_SYRINGE | INTRAMUSCULAR | Status: AC
  Filled 2020-03-12: qty 0.5

## 2020-03-12 MED ORDER — INFLUENZA VAC A&B SA ADJ QUAD 0.5 ML IM PRSY
0.5000 mL | PREFILLED_SYRINGE | Freq: Once | INTRAMUSCULAR | Status: AC
  Administered 2020-03-12: 0.5 mL via INTRAMUSCULAR

## 2020-03-12 NOTE — Progress Notes (Signed)
[place], [place] [address]   CLINIC:  Medical Oncology/Hematology  PCP:  Patient, No Pcp Per None None   REASON FOR VISIT:  Follow-up for metastatic prostate cancer  PRIOR THERAPY:  1. Bilateral orchiectomy on 02/05/2014. 2. Docetaxel x 6 cycles from 03/14/2014 to 06/26/2014. 3. Cabazitaxel x 55 cycles from 05/03/2016 to 12/17/2019.  NGS Results: Guardant 360 MSI high not detected  CURRENT THERAPY: Zytiga daily  BRIEF ONCOLOGIC HISTORY:  Oncology History  Malignant neoplasm of prostate ([place])  01/26/2014 Tumor Marker   PSA > 5000   01/28/2014 Initial Biopsy   Metastatic adenocarcinoma of prostate   02/05/2014 Surgery   Bilateral orchiectomy by Dr. Junious Silk   02/26/2014 Tumor Marker   PSA = 399.5   03/14/2014 Tumor Marker   PSA= 89.51   03/14/2014 - 06/26/2014 Chemotherapy   Docetaxel 75 mg/kg every 21 days x 6 cycles   04/24/2014 Tumor Marker   PSA- 19.89   07/23/2014 Procedure   Nephrostomy tube removed by IR, Dr. Geroge Baseman   07/24/2014 Tumor Marker   PSA= 6.15   10/16/2014 Tumor Marker   PSA: 3.54    01/08/2015 Tumor Marker   PSA: 2.13    01/17/2015 Imaging   CT CAP-  Massive pelvic and retroperitoneal lymphadenopathy noted on the prior study has nearly completely resolved, now with only a small amount of residual amorphous soft tissue predominantly around the the infrarenal abdominal aorta.   01/17/2015 Imaging   Bone scan- Widespread osseous metastatic disease with multiple foci of increased activity throughout the skeleton, corresponding with the findings on the prior CT from October, 2015.   04/11/2015 Tumor Marker   PSA: 1.87    07/21/2015 Imaging   Bone scan- Bony metastatic disease again noted at multiple sites, stable from prior study. No progression of bony metastatic disease is demonstrable on this study.   07/25/2015 Imaging   CT CAP- Stable matted soft tissue density in the retroperitoneum and  extraperitoneal pelvis consistent with treated disease. No recurrent lymphadenopathy.  No pulmonary metastatic disease. Diffuse stable sclerotic metastatic bone disease.   01/14/2016 Imaging   Bone scan-  Multiple sites of abnormal increased tracer localization involving BILATERAL ribs, T11, T12, questionably RIGHT scapula and LEFT iliac bone suspicious for osseous metastases.   01/23/2016 Imaging   CT CAP- 1. Similar widespread osseous metastasis. 2. Similar soft tissue thickening within the retroperitoneum of the abdomen. Improved left pelvic side wall soft tissue thickening. These are consistent with sites of treated disease. No well-defined adenopathy. 3. No new sites of disease.   05/03/2016 -  Chemotherapy   Jevtana every 21 days    05/03/2016 - 12/17/2019 Chemotherapy   The patient had pegfilgrastim (NEULASTA) injection 6 mg, 6 mg, Subcutaneous,  Once, 1 of 1 cycle Administration: 6 mg (05/05/2016) pegfilgrastim (NEULASTA ONPRO KIT) injection 6 mg, 6 mg, Subcutaneous, Once, 54 of 59 cycles Administration: 6 mg (05/24/2016), 6 mg (06/14/2016), 6 mg (07/07/2016), 6 mg (07/28/2016), 6 mg (08/18/2016), 6 mg (09/08/2016), 6 mg (09/29/2016), 6 mg (10/20/2016), 6 mg (11/10/2016), 6 mg (02/14/2017), 6 mg (03/07/2017), 6 mg (03/28/2017), 6 mg (04/20/2017), 6 mg (05/11/2017), 6 mg (06/27/2017), 6 mg (08/30/2017), 6 mg (09/20/2017), 6 mg (10/11/2017), 6 mg (11/02/2017), 6 mg (08/09/2017), 6 mg (11/23/2017), 6 mg (12/14/2017), 6 mg (01/09/2018), 6 mg (01/31/2018), 6 mg (04/04/2018), 6 mg (04/27/2018), 6 mg (05/18/2018), 6 mg (06/08/2018), 6 mg (06/29/2018), 6 mg (07/20/2018), 6 mg (08/10/2018), 6 mg (09/01/2018), 6 mg (09/22/2018),  6 mg (10/13/2018), 6 mg (11/03/2018), 6 mg (11/24/2018), 6 mg (12/15/2018), 6 mg (01/05/2019), 6 mg (01/26/2019), 6 mg (02/16/2019), 6 mg (03/12/2019), 6 mg (04/02/2019), 6 mg (04/23/2019), 6 mg (05/14/2019), 6 mg (06/04/2019), 6 mg (06/25/2019), 6 mg (07/16/2019), 6 mg (08/16/2019), 6 mg (09/06/2019), 6 mg (09/27/2019), 6 mg  (10/18/2019), 6 mg (12/17/2019) cabazitaxel (JEVTANA) 41 mg in dextrose 5 % 250 mL chemo infusion, 20 mg/m2 = 41 mg (100 % of original dose 20 mg/m2), Intravenous,  Once, 55 of 60 cycles Dose modification: 20 mg/m2 (original dose 20 mg/m2, Cycle 1, Reason: Dose not tolerated, Comment: recommended by Data to use 38m/m), 20 mg/m2 (original dose 20 mg/m2, Cycle 37, Reason: Other (see comments), Comment: Changed to new orderable), 21 mg/m2 (original dose 20 mg/m2, Cycle 40, Reason: Other (see comments)), 20 mg/m2 (original dose 20 mg/m2, Cycle 40, Reason: Other (see comments)) Administration: 41 mg (05/03/2016), 41 mg (05/24/2016), 41 mg (06/14/2016), 41 mg (07/07/2016), 41 mg (07/28/2016), 41 mg (08/18/2016), 41 mg (09/08/2016), 41 mg (09/29/2016), 41 mg (10/20/2016), 41 mg (11/10/2016), 41 mg (02/14/2017), 41 mg (03/07/2017), 41 mg (03/28/2017), 41 mg (04/20/2017), 41 mg (05/11/2017), 41 mg (06/27/2017), 41 mg (08/30/2017), 41 mg (09/20/2017), 41 mg (10/11/2017), 41 mg (11/02/2017), 41 mg (08/09/2017), 41 mg (11/23/2017), 41 mg (12/14/2017), 41 mg (01/09/2018), 41 mg (01/31/2018), 41 mg (02/21/2018), 41 mg (04/04/2018), 41 mg (04/27/2018), 41 mg (05/18/2018), 41 mg (06/08/2018), 41 mg (06/29/2018), 41 mg (07/20/2018), 41 mg (08/10/2018), 41 mg (09/01/2018), 41 mg (09/22/2018), 41 mg (10/13/2018), 41 mg (11/03/2018), 41 mg (11/24/2018), 41 mg (12/15/2018), 41 mg (01/05/2019), 41 mg (01/26/2019), 41 mg (02/16/2019), 41 mg (03/12/2019), 41 mg (04/02/2019), 41 mg (04/23/2019), 41 mg (05/14/2019), 41 mg (06/04/2019), 41 mg (06/25/2019), 41 mg (07/16/2019), 41 mg (08/16/2019), 41 mg (09/06/2019), 41 mg (09/27/2019), 41 mg (10/18/2019), 41 mg (12/17/2019)  for chemotherapy treatment.    09/06/2016 Imaging   Restaging CT C/A/P: IMPRESSION: 1. Similar appearance of widespread sclerotic bone metastases. 2. No change in soft tissue thickening within the retroperitoneum and left pelvic sidewall. No well defined adenopathy identified. 3. No new sites of disease 4. Aortic  Atherosclerosis (ICD10-I70.0). Coronary artery calcifications noted. 5. 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 6. Stable 9 mm pancreatic cystic lesion. Favor pseudocyst. Indolent neoplasm may look similar. Attention on follow-up imaging.   09/06/2016 Imaging   Bone Scan: IMPRESSION: In this patient with known diffuse sclerotic metastatic disease, bone scan does not reveal progression of radiotracer uptake. Several of the areas of previously demonstrated radiotracer uptake appear less prominent.  Change in appearance of radiotracer uptake involving the mandible now more notable on the right and previously more notable on the left may reflect result of dental disease rather than metastatic disease.    04/08/2017 Tumor Marker   PSA 0.89   01/07/2020 Genetic Testing   Guardant 360 CDx Results:         CANCER STAGING: Cancer Staging No matching staging information was found for the patient.  INTERVAL HISTORY:  Jeff [name] a 74y.o. male, returns for routine follow-up of his metastatic prostate cancer. DLenzywas last seen on 02/18/2020.   Today he reports feeling well. He denies having any N/V/D and he is tolerating the Zytiga well; he takes it every morning on an empty stomach. He finished the prednisone on 11/12. He denies having CP, lightheadedness or leg swelling. His appetite is excellent.   REVIEW OF SYSTEMS:  Review of Systems  Constitutional: Positive for  fatigue (75%). Negative for appetite change.  Cardiovascular: Negative for chest pain and leg swelling.  Gastrointestinal: Negative for diarrhea, nausea and vomiting.  Neurological: Negative for light-headedness.  All other systems reviewed and are negative.   PAST MEDICAL/SURGICAL HISTORY:  Past Medical History:  Diagnosis Date  . Acute renal failure ([place]) 01/26/2014  . Alcohol abuse   . Anemia of chronic disease 01/26/2014  . Bilateral  hydronephrosis 01/26/2014  . History of cocaine abuse ([place]) 01/26/2014  . Homelessness 01/31/2014  . Prostate cancer metastatic to multiple sites ([place]) 01/30/2014  . Sepsis ([place])   . UTI (lower urinary tract infection)    Past Surgical History:  Procedure Laterality Date  . ORCHIECTOMY Bilateral 02/05/2014   Procedure: BILATERAL ORCHIECTOMY;  Surgeon: Festus Aloe, MD;  Location: [facility];  Service: Urology;  Laterality: Bilateral;  . PERCUTANEOUS NEPHROSTOMY Bilateral    IR Dr. Junious Silk changed on 04/22/2014  . PORT-A-CATH REMOVAL Right 08/08/2017   Procedure: REMOVAL PORT-A-CATH RIGHT CHEST (PROCEDURE #2);  Surgeon: Aviva Signs, MD;  Location: [facility];  Service: General;  Laterality: Right;  . PORTACATH PLACEMENT Right 03/12/14  . PORTACATH PLACEMENT Left 08/08/2017   Procedure: INSERTION POWER PORT WITH ATTACHED CATHETER LEFT SUBCLAVIAN (PROCEDURE #1);  Surgeon: Aviva Signs, MD;  Location: [facility];  Service: General;  Laterality: Left;    SOCIAL HISTORY:  Social History   Socioeconomic History  . Marital status: Single    Spouse name: Not on file  . Number of children: 1  . Years of education: 9th  . Highest education level: Not on file  Occupational History  . Occupation: disabilty    Employer: [employer]  Tobacco Use  . Smoking status: Current Every Day Smoker    Packs/day: 0.25    Years: 55.00    Pack years: 13.75    Types: Cigarettes  . Smokeless tobacco: Never Used  Vaping Use  . Vaping Use: Never used  Substance and Sexual Activity  . Alcohol use: Not Currently    Alcohol/week: 1.0 standard drink    Types: 1 Cans of beer per week  . Drug use: Yes    Types: Cocaine    Comment: last used Cocaine 01/25/14  . Sexual activity: Yes  Other Topics Concern  . Not on file  Social History Narrative   Lives at [employer] 9th grade   1 son, lives in [place], [place]   Can read and write in native language            Social Determinants of  Health   Financial Resource Strain: Low Risk   . Difficulty of Paying Living Expenses: Not hard at all  Food Insecurity: No Food Insecurity  . Worried About Charity fundraiser in the Last Year: Never true  . Ran Out of Food in the Last Year: Never true  Transportation Needs: No Transportation Needs  . Lack of Transportation (Medical): No  . Lack of Transportation (Non-Medical): No  Physical Activity: Inactive  . Days of Exercise per Week: 0 days  . Minutes of Exercise per Session: 0 min  Stress: No Stress Concern Present  . Feeling of Stress : Not at all  Social Connections: Socially Isolated  . Frequency of Communication with Friends and Family: More than three times a week  . Frequency of Social Gatherings with Friends and Family: Three times a week  . Attends Religious Services: Never  . Active Member of Clubs or Organizations: No  . Attends  Club or Organization Meetings: Never  . Marital Status: Never married  Intimate Partner Violence: Not At Risk  . Fear of Current or Ex-Partner: No  . Emotionally Abused: No  . Physically Abused: No  . Sexually Abused: No    FAMILY HISTORY:  Family History  Problem Relation Age of Onset  . Cancer Brother 23    CURRENT MEDICATIONS:  Current Outpatient Medications  Medication Sig Dispense Refill  . abiraterone acetate (ZYTIGA) 250 MG tablet TAKE 4 TABLETS (1,000 MG TOTAL) BY MOUTH DAILY. TAKE ON AN EMPTY STOMACH 1 HOUR BEFORE OR 2 HOURS AFTER A MEAL 120 tablet 0  . aspirin EC 81 MG EC tablet Take 1 tablet (81 mg total) by mouth daily. Swallow whole. 30 tablet 11  . atorvastatin (LIPITOR) 40 MG tablet Take 1 tablet (40 mg total) by mouth daily.    . famotidine (PEPCID) 10 MG tablet Take 1 tablet (10 mg total) by mouth daily as needed for heartburn or indigestion.    . Menthol-Methyl Salicylate (MUSCLE RUB) 10-15 % CREA Apply 1 application topically 2 (two) times daily as needed for muscle pain. To back and hips  0  . midodrine  (PROAMATINE) 10 MG tablet Take 1 tablet (10 mg total) by mouth 3 (three) times daily with meals.    . simethicone (MYLICON) 80 MG chewable tablet Chew 1 tablet (80 mg total) by mouth 4 (four) times daily as needed for flatulence. 30 tablet 0  . predniSONE (DELTASONE) 5 MG tablet Take 1 tablet (5 mg total) by mouth daily with breakfast. 30 tablet 6   No current facility-administered medications for this visit.   Facility-Administered Medications Ordered in Other Visits  Medication Dose Route Frequency Provider Last Rate Last Admin  . cyanocobalamin ((VITAMIN B-12)) injection 1,000 mcg  1,000 mcg Intramuscular Once Kefalas, Thomas S, PA-C        ALLERGIES:  No Active Allergies  PHYSICAL EXAM:  Performance status (ECOG): 1 - Symptomatic but completely ambulatory  Vitals:   03/12/20 0951  BP: 109/87  Pulse: (!) 55  Resp: 18  Temp: (!) 96.7 F (35.9 C)  SpO2: 98%   Wt Readings from Last 3 Encounters:  03/12/20 158 lb 12.8 oz (72 kg)  02/25/20 153 lb (69.4 kg)  02/18/20 162 lb (73.5 kg)   Physical Exam Vitals reviewed.  Constitutional:      Appearance: Normal appearance.  Cardiovascular:     Rate and Rhythm: Normal rate and regular rhythm.     Pulses: Normal pulses.     Heart sounds: Normal heart sounds.  Pulmonary:     Effort: Pulmonary effort is normal.     Breath sounds: Normal breath sounds.  Musculoskeletal:     Right lower leg: No edema.     Left lower leg: No edema.  Neurological:     General: No focal deficit present.     Mental Status: He is alert and oriented to person, place, and time.  Psychiatric:        Mood and Affect: Mood normal.        Behavior: Behavior normal.      LABORATORY DATA:  I have reviewed the labs as listed.  CBC Latest Ref Rng & Units 03/10/2020 02/18/2020 01/21/2020  WBC 4.0 - 10.5 K/uL 4.1 6.5 5.4  Hemoglobin 13.0 - 17.0 g/dL 11.2(L) 11.4(L) 11.0(L)  Hematocrit 39 - 52 % 36.6(L) 36.4(L) 35.8(L)  Platelets 150 - 400 K/uL 263 351 348    CMP Latest Ref Rng &  Units 03/10/2020 02/18/2020 01/21/2020  Glucose 70 - 99 mg/dL 62(L) 93 98  BUN 8 - 23 mg/dL 16 45(H) 23  Creatinine 0.61 - 1.24 mg/dL 1.41(H) 1.39(H) 1.86(H)  Sodium 135 - 145 mmol/L 136 134(L) 136  Potassium 3.5 - 5.1 mmol/L 3.4(L) 4.4 4.5  Chloride 98 - 111 mmol/L 104 96(L) 101  CO2 22 - 32 mmol/L _0 Calcium 8.9 - 10.3 mg/dL 9.2 9.3 10.0  Total Protein 6.5 - 8.1 g/dL 7.6 8.4(H) 9.1(H)  Total Bilirubin 0.3 - 1.2 mg/dL 0.3 0.5 0.5  Alkaline Phos 38 - 126 U/L 64 54 60  AST 15 - 41 U/L _1 ALT 0 - 44 U/L _2 Lab Results  Component Value Date   PSA 0.11 03/10/2020   PSA 0.36 02/18/2020   PSA 2.33 01/21/2020    DIAGNOSTIC IMAGING:  I have independently reviewed the scans and discussed with the patient. No results found.   ASSESSMENT:  1. 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 -6 cycles of docetaxel from 03/14/2014 through 06/26/2014. -Cabazitaxel every 3 weeks started on 05/03/2016. -Baseline PSA between 0.7 and 1. -Bone scan on 08/03/2019 showed uptake in the right mandible, slightly more progressive than prior study in April 2019. Multiple additional studies in the ribs and pelvis are not identified. Uptake in the left maxilla, question dental disease. -CT CAP on 08/03/2019 did not show any significant change from August 2018 CT study. No new or progressive disease. Stable extensive sclerotic bone lesions throughout the axial and proximal appendicular skeleton. Chronic pathological mid T3 vertebral fracture. No pathologically enlarged abdominal lymph nodes. -Bone scan on 01/17/2020 showed multiple foci of progressive metastatic disease, within the mandible, left 3rd rib anteriorly, mid and lower thoracic spine, left aspect of L5 vertebral body and sternal body. -CTAP on 01/10/2020 showed unchanged posttreatment soft tissue thickening about the inferior aorta and vena cava. No  discretely enlarged abdominal or pelvic lymph nodes. Mild left hydronephrosis and hydroureter. -PSA on 01/21/2020 increased to 2.33. -Abiraterone with prednisone started around 01/24/2020. -Guardant 360 showed MSI high not detected.  No other relevant targetable mutations.  2. Left-sided stroke with right hemiparesis: -Hospitalized from 10/25/2019 through 11/20/2019 with right-sided weakness, right facial droop, MRI/MRA revealing acute left caudate/putamen infarct. MRA showed occlusion of the superior division of the left MCA M2 branch. Urine cocaine was positive. -He underwent rehab and most of his strength was regained.   PLAN:  1. 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 -He reportedly stopped taking prednisone 5 days ago when he ran out of the prescription.  His potassium today is 3.4.  Blood pressure is 109/87. -We will send a prescription for prednisone 5 mg daily.  He was told to take it daily without missing doses. -Reviewed his labs which showed normal LFTs. -PSA continues to trend down at 0.11. -I will see him back in 8 weeks for follow-up.  2. Bone metastasis: -Calcium today is 9.2.  Continue calcium supplements. -Continue monthly denosumab.  3. Renal insufficiency: -Creatinine today is 1.41.  This is at his baseline.  4. Normocytic anemia: -Hemoglobin is 11.2. -Combination anemia from CKD and relative iron deficiency.  Will consider iron panel soon.  5. Vitamin D deficiency: -Continue vitamin D supplements.  6. CVA: -Continue aspirin 81 mg and Lipitor 40 mg daily.   Orders placed this encounter:  No orders of the  defined types were placed in this encounter.    Derek Jack, MD [place] [phone]   I, Milinda Antis, am acting as a scribe for Dr. Sanda Linger.  I, Derek Jack MD, have reviewed the above documentation for accuracy  and completeness, and I agree with the above.

## 2020-03-12 NOTE — Progress Notes (Signed)
Patient requested flu shot today.  Fluad injection administered per MD orders.  Given in left deltoid.  Tolerated Well.  Patient ambulatory and in stable condition.

## 2020-03-12 NOTE — Progress Notes (Signed)
Patient is taking zytiga and prednisone and has not missed any doses of his zytiga but has been out of prednisone for 5 days and reports no side effects at this time. His potassium has dropped from 4.5 to 3.4.  Orders received for refills on prednisone, sent to his local pharmacy. He is okay to proceed with xgeva injection today.

## 2020-03-12 NOTE — Patient Instructions (Signed)
Jeff [name] at [facility] Discharge Instructions  You were seen today by Dr. Delton Coombes. He went over your recent results. You received your Xgeva injection today. Make sure to take prednisone on the same days that you take Zytiga. Dr. Delton Coombes will see you back in 2 months for labs and follow up.   Thank you for choosing [place] at [facility] to provide your oncology and hematology care.  To afford each patient quality time with our provider, please arrive at least 15 minutes before your scheduled appointment time.   If you have a lab appointment with the [facility] please come in thru the Main Entrance and check in at the main information desk  You need to re-schedule your appointment should you arrive 10 or more minutes late.  We strive to give you quality time with our providers, and arriving late affects you and other patients whose appointments are after yours.  Also, if you no show three or more times for appointments you may be dismissed from the clinic at the providers discretion.     Again, thank you for choosing [facility].  Our hope is that these requests will decrease the amount of time that you wait before being seen by our physicians.       _____________________________________________________________  Should you have questions after your visit to [facility], please contact our office at [phone] between the hours of 8:00 a.m. and 4:30 p.m.  Voicemails left after 4:00 p.m. will not be returned until the following business day.  For prescription refill requests, have your pharmacy contact our office and allow 72 hours.    Cancer Center Support Programs:   > Cancer Support Group  2nd Tuesday of the month 1pm-2pm, Journey Room

## 2020-03-17 ENCOUNTER — Other Ambulatory Visit: Payer: Self-pay

## 2020-03-17 ENCOUNTER — Inpatient Hospital Stay (HOSPITAL_COMMUNITY): Payer: Medicare Other

## 2020-03-17 ENCOUNTER — Encounter (HOSPITAL_COMMUNITY): Payer: Self-pay

## 2020-03-17 VITALS — BP 118/81 | HR 67 | Temp 97.0°F | Resp 16 | Wt 160.6 lb

## 2020-03-17 DIAGNOSIS — E538 Deficiency of other specified B group vitamins: Secondary | ICD-10-CM

## 2020-03-17 DIAGNOSIS — N189 Chronic kidney disease, unspecified: Secondary | ICD-10-CM | POA: Diagnosis not present

## 2020-03-17 DIAGNOSIS — Z7982 Long term (current) use of aspirin: Secondary | ICD-10-CM | POA: Diagnosis not present

## 2020-03-17 DIAGNOSIS — E559 Vitamin D deficiency, unspecified: Secondary | ICD-10-CM | POA: Diagnosis not present

## 2020-03-17 DIAGNOSIS — C61 Malignant neoplasm of prostate: Secondary | ICD-10-CM

## 2020-03-17 DIAGNOSIS — I69351 Hemiplegia and hemiparesis following cerebral infarction affecting right dominant side: Secondary | ICD-10-CM | POA: Diagnosis not present

## 2020-03-17 DIAGNOSIS — C7951 Secondary malignant neoplasm of bone: Secondary | ICD-10-CM | POA: Diagnosis not present

## 2020-03-17 DIAGNOSIS — D631 Anemia in chronic kidney disease: Secondary | ICD-10-CM | POA: Diagnosis not present

## 2020-03-17 DIAGNOSIS — M899 Disorder of bone, unspecified: Secondary | ICD-10-CM

## 2020-03-17 DIAGNOSIS — Z23 Encounter for immunization: Secondary | ICD-10-CM | POA: Diagnosis not present

## 2020-03-17 MED ORDER — CYANOCOBALAMIN 1000 MCG/ML IJ SOLN
1000.0000 ug | Freq: Once | INTRAMUSCULAR | Status: AC
  Administered 2020-03-17: 1000 ug via INTRAMUSCULAR
  Filled 2020-03-17: qty 1

## 2020-03-17 MED ORDER — DENOSUMAB 120 MG/1.7ML ~~LOC~~ SOLN
120.0000 mg | Freq: Once | SUBCUTANEOUS | Status: AC
  Administered 2020-03-17: 120 mg via SUBCUTANEOUS
  Filled 2020-03-17: qty 1.7

## 2020-03-17 NOTE — Progress Notes (Signed)
Jeff [name] presents today for injection per the provider's orders. Pt denies jaw/tooth pain or any recent or upcoming dental procedures. Xgeva and B12 administration without incident; injection site WNL; see MAR for injection details.  Patient tolerated procedure well and without incident.  No questions or complaints noted at this time.  Discharged ambulatory in stable condition.

## 2020-03-26 ENCOUNTER — Ambulatory Visit (INDEPENDENT_AMBULATORY_CARE_PROVIDER_SITE_OTHER): Payer: Medicare Other | Admitting: *Deleted

## 2020-03-26 ENCOUNTER — Other Ambulatory Visit: Payer: Self-pay

## 2020-03-26 VITALS — BP 130/78 | HR 52

## 2020-03-26 DIAGNOSIS — Z0130 Encounter for examination of blood pressure without abnormal findings: Secondary | ICD-10-CM

## 2020-03-26 NOTE — Progress Notes (Signed)
Pt in office today with medications. Pt states that he is only taking Prednisone 5 mg and Zytiga 250 mg . Pt denies taking any meds as PRN. Pt states that he feels fine today.

## 2020-04-04 ENCOUNTER — Other Ambulatory Visit (HOSPITAL_COMMUNITY): Payer: Self-pay | Admitting: Hematology

## 2020-04-04 DIAGNOSIS — C61 Malignant neoplasm of prostate: Secondary | ICD-10-CM

## 2020-04-07 ENCOUNTER — Other Ambulatory Visit (HOSPITAL_COMMUNITY): Payer: Self-pay | Admitting: Hematology

## 2020-04-14 ENCOUNTER — Encounter (HOSPITAL_COMMUNITY): Payer: Self-pay

## 2020-04-14 ENCOUNTER — Inpatient Hospital Stay (HOSPITAL_COMMUNITY): Payer: Medicare Other

## 2020-04-14 ENCOUNTER — Inpatient Hospital Stay (HOSPITAL_COMMUNITY): Payer: Medicare Other | Attending: Medical

## 2020-04-14 ENCOUNTER — Other Ambulatory Visit: Payer: Self-pay

## 2020-04-14 VITALS — BP 121/80 | HR 76 | Temp 97.0°F | Resp 18 | Wt 157.5 lb

## 2020-04-14 DIAGNOSIS — E538 Deficiency of other specified B group vitamins: Secondary | ICD-10-CM | POA: Insufficient documentation

## 2020-04-14 DIAGNOSIS — C61 Malignant neoplasm of prostate: Secondary | ICD-10-CM

## 2020-04-14 DIAGNOSIS — C7951 Secondary malignant neoplasm of bone: Secondary | ICD-10-CM | POA: Insufficient documentation

## 2020-04-14 DIAGNOSIS — M899 Disorder of bone, unspecified: Secondary | ICD-10-CM

## 2020-04-14 LAB — COMPREHENSIVE METABOLIC PANEL
ALT: 13 U/L (ref 0–44)
AST: 20 U/L (ref 15–41)
Albumin: 3.7 g/dL (ref 3.5–5.0)
Alkaline Phosphatase: 54 U/L (ref 38–126)
Anion gap: 6 (ref 5–15)
BUN: 15 mg/dL (ref 8–23)
CO2: 23 mmol/L (ref 22–32)
Calcium: 8.1 mg/dL — ABNORMAL LOW (ref 8.9–10.3)
Chloride: 109 mmol/L (ref 98–111)
Creatinine, Ser: 1.26 mg/dL — ABNORMAL HIGH (ref 0.61–1.24)
GFR, Estimated: >60 mL/min (ref >60)
Glucose, Bld: 95 mg/dL (ref 70–99)
Potassium: 4.3 mmol/L (ref 3.5–5.1)
Sodium: 138 mmol/L (ref 135–145)
Total Bilirubin: 0.3 mg/dL (ref 0.3–1.2)
Total Protein: 8 g/dL (ref 6.5–8.1)

## 2020-04-14 MED ORDER — DENOSUMAB 120 MG/1.7ML ~~LOC~~ SOLN
120.0000 mg | Freq: Once | SUBCUTANEOUS | Status: AC
  Administered 2020-04-14: 10:00:00 120 mg via SUBCUTANEOUS
  Filled 2020-04-14: qty 1.7

## 2020-04-14 MED ORDER — CYANOCOBALAMIN 1000 MCG/ML IJ SOLN
1000.0000 ug | Freq: Once | INTRAMUSCULAR | Status: AC
  Administered 2020-04-14: 10:00:00 1000 ug via INTRAMUSCULAR
  Filled 2020-04-14: qty 1

## 2020-04-14 NOTE — Progress Notes (Signed)
Patient here today for B12 and Xgeva.   Patient taking calcium as directed.  Denied tooth, jaw, and leg pain.  No recent or upcoming dental visits.  Labs reviewed.  Patient tolerated injections with no complaints voiced.  See MAR for details.  Sites clean and dry with no bruising or swelling noted.  Band aids applied.  Vss with discharge and left ambulatory with no s/s of distress noted.

## 2020-05-05 ENCOUNTER — Other Ambulatory Visit (HOSPITAL_COMMUNITY): Payer: Self-pay | Admitting: Hematology

## 2020-05-05 DIAGNOSIS — C61 Malignant neoplasm of prostate: Secondary | ICD-10-CM

## 2020-05-07 ENCOUNTER — Ambulatory Visit: Payer: Medicare Other | Admitting: Neurology

## 2020-05-07 ENCOUNTER — Encounter: Payer: Self-pay | Admitting: Neurology

## 2020-05-08 ENCOUNTER — Inpatient Hospital Stay (HOSPITAL_COMMUNITY): Payer: Medicare Other | Attending: Hematology

## 2020-05-08 ENCOUNTER — Other Ambulatory Visit: Payer: Self-pay

## 2020-05-08 DIAGNOSIS — C7951 Secondary malignant neoplasm of bone: Secondary | ICD-10-CM | POA: Diagnosis not present

## 2020-05-08 DIAGNOSIS — C61 Malignant neoplasm of prostate: Secondary | ICD-10-CM | POA: Insufficient documentation

## 2020-05-08 LAB — PSA: Prostatic Specific Antigen: 0.26 ng/mL (ref 0.00–4.00)

## 2020-05-08 LAB — CBC WITH DIFFERENTIAL/PLATELET
Abs Immature Granulocytes: 0.01 10*3/uL (ref 0.00–0.07)
Basophils Absolute: 0 10*3/uL (ref 0.0–0.1)
Basophils Relative: 0 %
Eosinophils Absolute: 0.1 10*3/uL (ref 0.0–0.5)
Eosinophils Relative: 2 %
HCT: 37.2 % — ABNORMAL LOW (ref 39.0–52.0)
Hemoglobin: 11.8 g/dL — ABNORMAL LOW (ref 13.0–17.0)
Immature Granulocytes: 0 %
Lymphocytes Relative: 25 %
Lymphs Abs: 1.5 10*3/uL (ref 0.7–4.0)
MCH: 29.8 pg (ref 26.0–34.0)
MCHC: 31.7 g/dL (ref 30.0–36.0)
MCV: 93.9 fL (ref 80.0–100.0)
Monocytes Absolute: 0.6 10*3/uL (ref 0.1–1.0)
Monocytes Relative: 10 %
Neutro Abs: 3.9 10*3/uL (ref 1.7–7.7)
Neutrophils Relative %: 63 %
Platelets: 264 10*3/uL (ref 150–400)
RBC: 3.96 MIL/uL — ABNORMAL LOW (ref 4.22–5.81)
RDW: 16.3 % — ABNORMAL HIGH (ref 11.5–15.5)
WBC: 6.1 10*3/uL (ref 4.0–10.5)
nRBC: 0 % (ref 0.0–0.2)

## 2020-05-08 LAB — COMPREHENSIVE METABOLIC PANEL
ALT: 15 U/L (ref 0–44)
AST: 21 U/L (ref 15–41)
Albumin: 3.3 g/dL — ABNORMAL LOW (ref 3.5–5.0)
Alkaline Phosphatase: 56 U/L (ref 38–126)
Anion gap: 8 (ref 5–15)
BUN: 16 mg/dL (ref 8–23)
CO2: 28 mmol/L (ref 22–32)
Calcium: 9 mg/dL (ref 8.9–10.3)
Chloride: 101 mmol/L (ref 98–111)
Creatinine, Ser: 1.53 mg/dL — ABNORMAL HIGH (ref 0.61–1.24)
GFR, Estimated: 48 mL/min — ABNORMAL LOW (ref >60)
Glucose, Bld: 87 mg/dL (ref 70–99)
Potassium: 3.9 mmol/L (ref 3.5–5.1)
Sodium: 137 mmol/L (ref 135–145)
Total Bilirubin: 0.5 mg/dL (ref 0.3–1.2)
Total Protein: 7.4 g/dL (ref 6.5–8.1)

## 2020-05-09 ENCOUNTER — Inpatient Hospital Stay (HOSPITAL_COMMUNITY): Payer: Medicare Other

## 2020-05-09 ENCOUNTER — Other Ambulatory Visit: Payer: Self-pay

## 2020-05-09 VITALS — BP 116/72 | HR 68 | Temp 97.6°F | Resp 18

## 2020-05-09 DIAGNOSIS — E538 Deficiency of other specified B group vitamins: Secondary | ICD-10-CM

## 2020-05-09 DIAGNOSIS — C61 Malignant neoplasm of prostate: Secondary | ICD-10-CM

## 2020-05-09 DIAGNOSIS — C7951 Secondary malignant neoplasm of bone: Secondary | ICD-10-CM | POA: Diagnosis not present

## 2020-05-09 DIAGNOSIS — M899 Disorder of bone, unspecified: Secondary | ICD-10-CM

## 2020-05-09 MED ORDER — CYANOCOBALAMIN 1000 MCG/ML IJ SOLN
1000.0000 ug | Freq: Once | INTRAMUSCULAR | Status: AC
  Administered 2020-05-09: 1000 ug via INTRAMUSCULAR
  Filled 2020-05-09: qty 1

## 2020-05-09 MED ORDER — DENOSUMAB 120 MG/1.7ML ~~LOC~~ SOLN
120.0000 mg | Freq: Once | SUBCUTANEOUS | Status: AC
  Administered 2020-05-09: 120 mg via SUBCUTANEOUS
  Filled 2020-05-09: qty 1.7

## 2020-05-09 NOTE — Progress Notes (Signed)
Jeff [name] presents today for injection per the provider's orders.  Xgeva and Vit. b-12 was administration without incident; Right Upper Arm and Left Upper Arm  injection sites WNL; see MAR for injection details.  Patient tolerated procedure well and without incident.  No questions or complaints noted at this time.  Patient ambulatory and in stable condition upon discharge.

## 2020-05-12 ENCOUNTER — Ambulatory Visit (HOSPITAL_COMMUNITY): Payer: Medicare Other

## 2020-05-12 ENCOUNTER — Ambulatory Visit (HOSPITAL_COMMUNITY): Payer: Medicare Other | Admitting: Hematology

## 2020-05-20 ENCOUNTER — Inpatient Hospital Stay (HOSPITAL_COMMUNITY): Payer: Medicare Other | Admitting: Hematology

## 2020-06-09 ENCOUNTER — Inpatient Hospital Stay (HOSPITAL_COMMUNITY): Payer: Medicare Other | Attending: Hematology

## 2020-06-09 ENCOUNTER — Inpatient Hospital Stay (HOSPITAL_COMMUNITY): Payer: Medicare Other

## 2020-06-09 ENCOUNTER — Other Ambulatory Visit: Payer: Self-pay

## 2020-06-09 ENCOUNTER — Inpatient Hospital Stay (HOSPITAL_BASED_OUTPATIENT_CLINIC_OR_DEPARTMENT_OTHER): Payer: Medicare Other | Admitting: Hematology

## 2020-06-09 ENCOUNTER — Encounter (HOSPITAL_COMMUNITY): Payer: Self-pay | Admitting: Hematology

## 2020-06-09 VITALS — BP 117/79 | HR 83 | Temp 96.9°F | Resp 18 | Wt 164.4 lb

## 2020-06-09 DIAGNOSIS — E559 Vitamin D deficiency, unspecified: Secondary | ICD-10-CM | POA: Diagnosis not present

## 2020-06-09 DIAGNOSIS — C7951 Secondary malignant neoplasm of bone: Secondary | ICD-10-CM | POA: Diagnosis not present

## 2020-06-09 DIAGNOSIS — E538 Deficiency of other specified B group vitamins: Secondary | ICD-10-CM

## 2020-06-09 DIAGNOSIS — M899 Disorder of bone, unspecified: Secondary | ICD-10-CM

## 2020-06-09 DIAGNOSIS — Z7982 Long term (current) use of aspirin: Secondary | ICD-10-CM | POA: Insufficient documentation

## 2020-06-09 DIAGNOSIS — C61 Malignant neoplasm of prostate: Secondary | ICD-10-CM

## 2020-06-09 DIAGNOSIS — D649 Anemia, unspecified: Secondary | ICD-10-CM | POA: Diagnosis not present

## 2020-06-09 DIAGNOSIS — I639 Cerebral infarction, unspecified: Secondary | ICD-10-CM | POA: Insufficient documentation

## 2020-06-09 DIAGNOSIS — N289 Disorder of kidney and ureter, unspecified: Secondary | ICD-10-CM | POA: Diagnosis not present

## 2020-06-09 LAB — COMPREHENSIVE METABOLIC PANEL
ALT: 12 U/L (ref 0–44)
AST: 20 U/L (ref 15–41)
Albumin: 3.7 g/dL (ref 3.5–5.0)
Alkaline Phosphatase: 55 U/L (ref 38–126)
Anion gap: 9 (ref 5–15)
BUN: 20 mg/dL (ref 8–23)
CO2: 27 mmol/L (ref 22–32)
Calcium: 9.4 mg/dL (ref 8.9–10.3)
Chloride: 101 mmol/L (ref 98–111)
Creatinine, Ser: 1.35 mg/dL — ABNORMAL HIGH (ref 0.61–1.24)
GFR, Estimated: 55 mL/min — ABNORMAL LOW (ref >60)
Glucose, Bld: 105 mg/dL — ABNORMAL HIGH (ref 70–99)
Potassium: 4.2 mmol/L (ref 3.5–5.1)
Sodium: 137 mmol/L (ref 135–145)
Total Bilirubin: 0.4 mg/dL (ref 0.3–1.2)
Total Protein: 7.8 g/dL (ref 6.5–8.1)

## 2020-06-09 LAB — PSA: Prostatic Specific Antigen: 0.23 ng/mL (ref 0.00–4.00)

## 2020-06-09 LAB — CBC WITH DIFFERENTIAL/PLATELET
Abs Immature Granulocytes: 0.05 10*3/uL (ref 0.00–0.07)
Basophils Absolute: 0 10*3/uL (ref 0.0–0.1)
Basophils Relative: 0 %
Eosinophils Absolute: 0.1 10*3/uL (ref 0.0–0.5)
Eosinophils Relative: 1 %
HCT: 37.2 % — ABNORMAL LOW (ref 39.0–52.0)
Hemoglobin: 11.6 g/dL — ABNORMAL LOW (ref 13.0–17.0)
Immature Granulocytes: 1 %
Lymphocytes Relative: 24 %
Lymphs Abs: 1.7 10*3/uL (ref 0.7–4.0)
MCH: 29.6 pg (ref 26.0–34.0)
MCHC: 31.2 g/dL (ref 30.0–36.0)
MCV: 94.9 fL (ref 80.0–100.0)
Monocytes Absolute: 0.6 10*3/uL (ref 0.1–1.0)
Monocytes Relative: 8 %
Neutro Abs: 4.7 10*3/uL (ref 1.7–7.7)
Neutrophils Relative %: 66 %
Platelets: 315 10*3/uL (ref 150–400)
RBC: 3.92 MIL/uL — ABNORMAL LOW (ref 4.22–5.81)
RDW: 16.5 % — ABNORMAL HIGH (ref 11.5–15.5)
WBC: 7.2 10*3/uL (ref 4.0–10.5)
nRBC: 0 % (ref 0.0–0.2)

## 2020-06-09 MED ORDER — LANREOTIDE ACETATE 120 MG/0.5ML ~~LOC~~ SOLN
SUBCUTANEOUS | Status: AC
  Filled 2020-06-09: qty 120

## 2020-06-09 MED ORDER — CYANOCOBALAMIN 1000 MCG/ML IJ SOLN
1000.0000 ug | Freq: Once | INTRAMUSCULAR | Status: AC
  Administered 2020-06-09: 1000 ug via INTRAMUSCULAR
  Filled 2020-06-09: qty 1

## 2020-06-09 MED ORDER — DENOSUMAB 120 MG/1.7ML ~~LOC~~ SOLN
120.0000 mg | Freq: Once | SUBCUTANEOUS | Status: AC
  Administered 2020-06-09: 120 mg via SUBCUTANEOUS
  Filled 2020-06-09: qty 1.7

## 2020-06-09 MED ORDER — FULVESTRANT 250 MG/5ML IM SOLN
INTRAMUSCULAR | Status: AC
  Filled 2020-06-09: qty 10

## 2020-06-09 NOTE — Progress Notes (Signed)
[address] [place], [place] [address]   CLINIC:  Medical Oncology/Hematology  PCP:  Patient, No Pcp Per None None   REASON FOR VISIT:  Follow-up for metastatic prostate cancer  PRIOR THERAPY:  1. Bilateral orchiectomy on 02/05/2014. 2. Docetaxel x 6 cycles from 03/14/2014 to 06/26/2014. 3. Cabazitaxel x 55 cycles from 05/03/2016 to 12/17/2019.  NGS Results: Guardant 360 MSI high not detected  CURRENT THERAPY: Zytiga 1,000 mg daily  BRIEF ONCOLOGIC HISTORY:  Oncology History  Malignant neoplasm of prostate ([place])  01/26/2014 Tumor Marker   PSA > 5000   01/28/2014 Initial Biopsy   Metastatic adenocarcinoma of prostate   02/05/2014 Surgery   Bilateral orchiectomy by Dr. Junious Silk   02/26/2014 Tumor Marker   PSA = 399.5   03/14/2014 Tumor Marker   PSA= 89.51   03/14/2014 - 06/26/2014 Chemotherapy   Docetaxel 75 mg/kg every 21 days x 6 cycles   04/24/2014 Tumor Marker   PSA- 19.89   07/23/2014 Procedure   Nephrostomy tube removed by IR, Dr. Geroge Baseman   07/24/2014 Tumor Marker   PSA= 6.15   10/16/2014 Tumor Marker   PSA: 3.54    01/08/2015 Tumor Marker   PSA: 2.13    01/17/2015 Imaging   CT CAP-  Massive pelvic and retroperitoneal lymphadenopathy noted on the prior study has nearly completely resolved, now with only a small amount of residual amorphous soft tissue predominantly around the the infrarenal abdominal aorta.   01/17/2015 Imaging   Bone scan- Widespread osseous metastatic disease with multiple foci of increased activity throughout the skeleton, corresponding with the findings on the prior CT from October, 2015.   04/11/2015 Tumor Marker   PSA: 1.87    07/21/2015 Imaging   Bone scan- Bony metastatic disease again noted at multiple sites, stable from prior study. No progression of bony metastatic disease is demonstrable on this study.   07/25/2015 Imaging   CT CAP- Stable matted soft tissue density in the retroperitoneum  and extraperitoneal pelvis consistent with treated disease. No recurrent lymphadenopathy.  No pulmonary metastatic disease. Diffuse stable sclerotic metastatic bone disease.   01/14/2016 Imaging   Bone scan-  Multiple sites of abnormal increased tracer localization involving BILATERAL ribs, T11, T12, questionably RIGHT scapula and LEFT iliac bone suspicious for osseous metastases.   01/23/2016 Imaging   CT CAP- 1. Similar widespread osseous metastasis. 2. Similar soft tissue thickening within the retroperitoneum of the abdomen. Improved left pelvic side wall soft tissue thickening. These are consistent with sites of treated disease. No well-defined adenopathy. 3. No new sites of disease.   05/03/2016 -  Chemotherapy   Jevtana every 21 days    05/03/2016 - 12/17/2019 Chemotherapy   The patient had pegfilgrastim (NEULASTA) injection 6 mg, 6 mg, Subcutaneous,  Once, 1 of 1 cycle Administration: 6 mg (05/05/2016) pegfilgrastim (NEULASTA ONPRO KIT) injection 6 mg, 6 mg, Subcutaneous, Once, 54 of 59 cycles Administration: 6 mg (05/24/2016), 6 mg (06/14/2016), 6 mg (07/07/2016), 6 mg (07/28/2016), 6 mg (08/18/2016), 6 mg (09/08/2016), 6 mg (09/29/2016), 6 mg (10/20/2016), 6 mg (11/10/2016), 6 mg (02/14/2017), 6 mg (03/07/2017), 6 mg (03/28/2017), 6 mg (04/20/2017), 6 mg (05/11/2017), 6 mg (06/27/2017), 6 mg (08/30/2017), 6 mg (09/20/2017), 6 mg (10/11/2017), 6 mg (11/02/2017), 6 mg (08/09/2017), 6 mg (11/23/2017), 6 mg (12/14/2017), 6 mg (01/09/2018), 6 mg (01/31/2018), 6 mg (04/04/2018), 6 mg (04/27/2018), 6 mg (05/18/2018), 6 mg (06/08/2018), 6 mg (06/29/2018), 6 mg (07/20/2018), 6 mg (08/10/2018), 6 mg (09/01/2018), 6  mg (09/22/2018), 6 mg (10/13/2018), 6 mg (11/03/2018), 6 mg (11/24/2018), 6 mg (12/15/2018), 6 mg (01/05/2019), 6 mg (01/26/2019), 6 mg (02/16/2019), 6 mg (03/12/2019), 6 mg (04/02/2019), 6 mg (04/23/2019), 6 mg (05/14/2019), 6 mg (06/04/2019), 6 mg (06/25/2019), 6 mg (07/16/2019), 6 mg (08/16/2019), 6 mg (09/06/2019), 6 mg (09/27/2019), 6 mg  (10/18/2019), 6 mg (12/17/2019) cabazitaxel (JEVTANA) 41 mg in dextrose 5 % 250 mL chemo infusion, 20 mg/m2 = 41 mg (100 % of original dose 20 mg/m2), Intravenous,  Once, 55 of 60 cycles Dose modification: 20 mg/m2 (original dose 20 mg/m2, Cycle 1, Reason: Dose not tolerated, Comment: recommended by Data to use $Remo'20mg'MnWzL$ /m), 20 mg/m2 (original dose 20 mg/m2, Cycle 37, Reason: Other (see comments), Comment: Changed to new orderable), 21 mg/m2 (original dose 20 mg/m2, Cycle 40, Reason: Other (see comments)), 20 mg/m2 (original dose 20 mg/m2, Cycle 40, Reason: Other (see comments)) Administration: 41 mg (05/03/2016), 41 mg (05/24/2016), 41 mg (06/14/2016), 41 mg (07/07/2016), 41 mg (07/28/2016), 41 mg (08/18/2016), 41 mg (09/08/2016), 41 mg (09/29/2016), 41 mg (10/20/2016), 41 mg (11/10/2016), 41 mg (02/14/2017), 41 mg (03/07/2017), 41 mg (03/28/2017), 41 mg (04/20/2017), 41 mg (05/11/2017), 41 mg (06/27/2017), 41 mg (08/30/2017), 41 mg (09/20/2017), 41 mg (10/11/2017), 41 mg (11/02/2017), 41 mg (08/09/2017), 41 mg (11/23/2017), 41 mg (12/14/2017), 41 mg (01/09/2018), 41 mg (01/31/2018), 41 mg (02/21/2018), 41 mg (04/04/2018), 41 mg (04/27/2018), 41 mg (05/18/2018), 41 mg (06/08/2018), 41 mg (06/29/2018), 41 mg (07/20/2018), 41 mg (08/10/2018), 41 mg (09/01/2018), 41 mg (09/22/2018), 41 mg (10/13/2018), 41 mg (11/03/2018), 41 mg (11/24/2018), 41 mg (12/15/2018), 41 mg (01/05/2019), 41 mg (01/26/2019), 41 mg (02/16/2019), 41 mg (03/12/2019), 41 mg (04/02/2019), 41 mg (04/23/2019), 41 mg (05/14/2019), 41 mg (06/04/2019), 41 mg (06/25/2019), 41 mg (07/16/2019), 41 mg (08/16/2019), 41 mg (09/06/2019), 41 mg (09/27/2019), 41 mg (10/18/2019), 41 mg (12/17/2019)  for chemotherapy treatment.    09/06/2016 Imaging   Restaging CT C/A/P: IMPRESSION: 1. Similar appearance of widespread sclerotic bone metastases. 2. No change in soft tissue thickening within the retroperitoneum and left pelvic sidewall. No well defined adenopathy identified. 3. No new sites of disease 4. Aortic  Atherosclerosis (ICD10-I70.0). Coronary artery calcifications noted. 5. 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 6. Stable 9 mm pancreatic cystic lesion. Favor pseudocyst. Indolent neoplasm may look similar. Attention on follow-up imaging.   09/06/2016 Imaging   Bone Scan: IMPRESSION: In this patient with known diffuse sclerotic metastatic disease, bone scan does not reveal progression of radiotracer uptake. Several of the areas of previously demonstrated radiotracer uptake appear less prominent.  Change in appearance of radiotracer uptake involving the mandible now more notable on the right and previously more notable on the left may reflect result of dental disease rather than metastatic disease.    04/08/2017 Tumor Marker   PSA 0.89   01/07/2020 Genetic Testing   Guardant 360 CDx Results:         CANCER STAGING: Cancer Staging No matching staging information was found for the patient.  INTERVAL HISTORY:  Mr. Lui Bellis, a 75 y.o. male, returns for routine follow-up of his metastatic prostate cancer. Kol was last seen on 03/12/2020.   Today he reports feeling okay. He denies having any N/V/D and is tolerating the Zytiga well without abdominal pain, jaw pain or leg swelling; he is taking Zytiga on an empty stomach. He continues taking prednisone 5 mg with breakfast. His appetite is excellent.   REVIEW OF SYSTEMS:  Review of Systems  Constitutional:  Positive for fatigue (75%). Negative for appetite change.  HENT:   Negative for sore throat and trouble swallowing.   Cardiovascular: Negative for leg swelling.  Gastrointestinal: Negative for abdominal pain, diarrhea, nausea and vomiting.  All other systems reviewed and are negative.   PAST MEDICAL/SURGICAL HISTORY:  Past Medical History:  Diagnosis Date  . Acute renal failure ([place]) 01/26/2014  . Alcohol abuse   . Anemia of chronic disease 01/26/2014   . Bilateral hydronephrosis 01/26/2014  . History of cocaine abuse ([place]) 01/26/2014  . Homelessness 01/31/2014  . Prostate cancer metastatic to multiple sites ([place]) 01/30/2014  . Sepsis ([place])   . UTI (lower urinary tract infection)    Past Surgical History:  Procedure Laterality Date  . ORCHIECTOMY Bilateral 02/05/2014   Procedure: BILATERAL ORCHIECTOMY;  Surgeon: Festus Aloe, MD;  Location: [facility];  Service: Urology;  Laterality: Bilateral;  . PERCUTANEOUS NEPHROSTOMY Bilateral    IR Dr. Junious Silk changed on 04/22/2014  . PORT-A-CATH REMOVAL Right 08/08/2017   Procedure: REMOVAL PORT-A-CATH RIGHT CHEST (PROCEDURE #2);  Surgeon: Aviva Signs, MD;  Location: [facility];  Service: General;  Laterality: Right;  . PORTACATH PLACEMENT Right 03/12/14  . PORTACATH PLACEMENT Left 08/08/2017   Procedure: INSERTION POWER PORT WITH ATTACHED CATHETER LEFT SUBCLAVIAN (PROCEDURE #1);  Surgeon: Aviva Signs, MD;  Location: [facility];  Service: General;  Laterality: Left;    SOCIAL HISTORY:  Social History   Socioeconomic History  . Marital status: Single    Spouse name: Not on file  . Number of children: 1  . Years of education: 9th  . Highest education level: Not on file  Occupational History  . Occupation: disabilty    Employer: [employer]  Tobacco Use  . Smoking status: Current Every Day Smoker    Packs/day: 0.25    Years: 55.00    Pack years: 13.75    Types: Cigarettes  . Smokeless tobacco: Never Used  Vaping Use  . Vaping Use: Never used  Substance and Sexual Activity  . Alcohol use: Not Currently    Alcohol/week: 1.0 standard drink    Types: 1 Cans of beer per week  . Drug use: Yes    Types: Cocaine    Comment: last used Cocaine 01/25/14  . Sexual activity: Yes  Other Topics Concern  . Not on file  Social History Narrative   Lives at [employer] 9th grade   1 son, lives in [place], [place]   Can read and write in native language            Social  Determinants of Health   Financial Resource Strain: Low Risk   . Difficulty of Paying Living Expenses: Not hard at all  Food Insecurity: No Food Insecurity  . Worried About Charity fundraiser in the Last Year: Never true  . Ran Out of Food in the Last Year: Never true  Transportation Needs: No Transportation Needs  . Lack of Transportation (Medical): No  . Lack of Transportation (Non-Medical): No  Physical Activity: Inactive  . Days of Exercise per Week: 0 days  . Minutes of Exercise per Session: 0 min  Stress: No Stress Concern Present  . Feeling of Stress : Not at all  Social Connections: Socially Isolated  . Frequency of Communication with Friends and Family: More than three times a week  . Frequency of Social Gatherings with Friends and Family: Three times a week  . Attends Religious Services: Never  . Active Member  of Clubs or Organizations: No  . Attends Archivist Meetings: Never  . Marital Status: Never married  Intimate Partner Violence: Not At Risk  . Fear of Current or Ex-Partner: No  . Emotionally Abused: No  . Physically Abused: No  . Sexually Abused: No    FAMILY HISTORY:  Family History  Problem Relation Age of Onset  . Cancer Brother 14    CURRENT MEDICATIONS:  Current Outpatient Medications  Medication Sig Dispense Refill  . abiraterone acetate (ZYTIGA) 250 MG tablet TAKE 4 TABLETS (1,000 MG TOTAL) BY MOUTH DAILY. TAKE ON AN EMPTY STOMACH 1 HOUR BEFORE OR 2 HOURS AFTER A MEAL 120 tablet 0  . aspirin EC 81 MG EC tablet Take 1 tablet (81 mg total) by mouth daily. Swallow whole. 30 tablet 11  . atorvastatin (LIPITOR) 40 MG tablet Take 1 tablet (40 mg total) by mouth daily.    . famotidine (PEPCID) 10 MG tablet Take 1 tablet (10 mg total) by mouth daily as needed for heartburn or indigestion.    . Menthol-Methyl Salicylate (MUSCLE RUB) 10-15 % CREA Apply 1 application topically 2 (two) times daily as needed for muscle pain. To back and hips  0  .  midodrine (PROAMATINE) 10 MG tablet Take 1 tablet (10 mg total) by mouth 3 (three) times daily with meals.    . predniSONE (DELTASONE) 5 MG tablet Take 1 tablet (5 mg total) by mouth daily with breakfast. 30 tablet 6  . simethicone (MYLICON) 80 MG chewable tablet Chew 1 tablet (80 mg total) by mouth 4 (four) times daily as needed for flatulence. 30 tablet 0   No current facility-administered medications for this visit.   Facility-Administered Medications Ordered in Other Visits  Medication Dose Route Frequency Provider Last Rate Last Admin  . cyanocobalamin ((VITAMIN B-12)) injection 1,000 mcg  1,000 mcg Intramuscular Once Kefalas, Thomas S, PA-C        ALLERGIES:  No Active Allergies  PHYSICAL EXAM:  Performance status (ECOG): 1 - Symptomatic but completely ambulatory  Vitals:   06/09/20 1004  BP: 117/79  Pulse: 83  Resp: 18  Temp: (!) 96.9 F (36.1 C)  SpO2: 99%   Wt Readings from Last 3 Encounters:  06/09/20 164 lb 6.4 oz (74.6 kg)  04/14/20 157 lb 8 oz (71.4 kg)  03/17/20 160 lb 9.6 oz (72.8 kg)   Physical Exam Vitals reviewed.  Constitutional:      Appearance: Normal appearance.  Cardiovascular:     Rate and Rhythm: Normal rate and regular rhythm.     Pulses: Normal pulses.     Heart sounds: Normal heart sounds.  Pulmonary:     Effort: Pulmonary effort is normal.     Breath sounds: Normal breath sounds.  Musculoskeletal:     Right lower leg: No edema.     Left lower leg: No edema.  Neurological:     General: No focal deficit present.     Mental Status: He is alert and oriented to person, place, and time.  Psychiatric:        Mood and Affect: Mood normal.        Behavior: Behavior normal.      LABORATORY DATA:  I have reviewed the labs as listed.  CBC Latest Ref Rng & Units 06/09/2020 05/08/2020 03/10/2020  WBC 4.0 - 10.5 K/uL 7.2 6.1 4.1  Hemoglobin 13.0 - 17.0 g/dL 11.6(L) 11.8(L) 11.2(L)  Hematocrit 39.0 - 52.0 % 37.2(L) 37.2(L) 36.6(L)  Platelets 150  -  400 K/uL 315 264 263   CMP Latest Ref Rng & Units 06/09/2020 05/08/2020 04/14/2020  Glucose 70 - 99 mg/dL 105(H) 87 95  BUN 8 - 23 mg/dL $Remove'20 16 15  'dAQbJaD$ Creatinine 0.61 - 1.24 mg/dL 1.35(H) 1.53(H) 1.26(H)  Sodium 135 - 145 mmol/L 137 137 138  Potassium 3.5 - 5.1 mmol/L 4.2 3.9 4.3  Chloride 98 - 111 mmol/L 101 101 109  CO2 22 - 32 mmol/L $RemoveB'27 28 23  'SVKVyqGF$ Calcium 8.9 - 10.3 mg/dL 9.4 9.0 8.1(L)  Total Protein 6.5 - 8.1 g/dL 7.8 7.4 8.0  Total Bilirubin 0.3 - 1.2 mg/dL 0.4 0.5 0.3  Alkaline Phos 38 - 126 U/L 55 56 54  AST 15 - 41 U/L $Remo'20 21 20  'yGuQo$ ALT 0 - 44 U/L $Remo'12 15 13   'FIOmY$ PSA 0.26 05/08/2020  PSA 0.11 03/10/2020  PSA 0.36 02/18/2020    DIAGNOSTIC IMAGING:  I have independently reviewed the scans and discussed with the patient. No results found.   ASSESSMENT:  1. 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 -6 cycles of docetaxel from 03/14/2014 through 06/26/2014. -Cabazitaxel every 3 weeks started on 05/03/2016. -Baseline PSA between 0.7 and 1. -Bone scan on 08/03/2019 showed uptake in the right mandible, slightly more progressive than prior study in April 2019. Multiple additional studies in the ribs and pelvis are not identified. Uptake in the left maxilla, question dental disease. -CT CAP on 08/03/2019 did not show any significant change from August 2018 CT study. No new or progressive disease. Stable extensive sclerotic bone lesions throughout the axial and proximal appendicular skeleton. Chronic pathological mid T3 vertebral fracture. No pathologically enlarged abdominal lymph nodes. -Bone scan on 01/17/2020 showed multiple foci of progressive metastatic disease, within the mandible, left 3rd rib anteriorly, mid and lower thoracic spine, left aspect of L5 vertebral body and sternal body. -CTAP on 01/10/2020 showed unchanged posttreatment soft tissue thickening about the inferior aorta and vena cava. No discretely enlarged abdominal or pelvic  lymph nodes. Mild left hydronephrosis and hydroureter. -PSA on 01/21/2020 increased to 2.33. -Abiraterone with prednisone started around 01/24/2020. -Guardant 360 showed MSI high not detected. No other relevant targetable mutations.  2. Left-sided stroke with right hemiparesis: -Hospitalized from 10/25/2019 through 11/20/2019 with right-sided weakness, right facial droop, MRI/MRA revealing acute left caudate/putamen infarct. MRA showed occlusion of the superior division of the left MCA M2 branch. Urine cocaine was positive. -He underwent rehab and most of his strength was regained.   PLAN:  1. 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  He is taking prednisone 5 mg daily. -PSA today 0.23 and slightly improved from last time 0.26. -Continue Abiraterone at this time. -RTC 8 weeks for follow-up with repeat labs and PSA.  2. Bone metastasis: -Continue monthly denosumab.  Calcium is 9.4.  Continue calcium supplements.  3. Renal insufficiency: -Creatinine improved to 1.35.  4. Normocytic anemia: -Hemoglobin is 11.6. -Combination anemia from CKD and relative iron deficiency.  Will check ferritin and iron panel.  5. Vitamin D deficiency: -Continue vitamin D supplements.  6. CVA: -Continue aspirin 81 mg and Lipitor 40 mg daily.   Orders placed this encounter:  No orders of the defined types were placed in this encounter.    Derek Jack, MD [facility] [phone]   I, Milinda Antis, am acting as a scribe for Dr. Sanda Linger.  IDerek Jack MD, have reviewed the above documentation  for accuracy and completeness, and I agree with the above.     

## 2020-06-09 NOTE — Progress Notes (Signed)
Pt is taking Zytiga as prescribed with no side effects. 

## 2020-06-09 NOTE — Patient Instructions (Signed)
[place] at [facility] Discharge Instructions  You were seen today by Dr. Delton Coombes. He went over your recent results. You received your Xgeva injection today for your bones; continue getting it every month. Continue taking Zytiga 4 tablets every morning on an empty stomach. Dr. Delton Coombes will see you back in 2 months for labs and follow up.   Thank you for choosing [place] at [facility] to provide your oncology and hematology care.  To afford each patient quality time with our provider, please arrive at least 15 minutes before your scheduled appointment time.   If you have a lab appointment with the [facility] please come in thru the Main Entrance and check in at the main information desk  You need to re-schedule your appointment should you arrive 10 or more minutes late.  We strive to give you quality time with our providers, and arriving late affects you and other patients whose appointments are after yours.  Also, if you no show three or more times for appointments you may be dismissed from the clinic at the providers discretion.     Again, thank you for choosing [facility].  Our hope is that these requests will decrease the amount of time that you wait before being seen by our physicians.       _____________________________________________________________  Should you have questions after your visit to [facility], please contact our office at [phone] between the hours of 8:00 a.m. and 4:30 p.m.  Voicemails left after 4:00 p.m. will not be returned until the following business day.  For prescription refill requests, have your pharmacy contact our office and allow 72 hours.    Cancer Center Support Programs:   > Cancer Support Group  2nd Tuesday of the month 1pm-2pm, Journey Room

## 2020-06-09 NOTE — Progress Notes (Signed)
Patient was assessed by Dr. Delton Coombes and labs have been reviewed.  Patient is okay to proceed with Xgeva and B12 injections today. Primary RN and pharmacy aware.

## 2020-06-09 NOTE — Progress Notes (Signed)
Patient taking calcium as directed.  Denied tooth, jaw, and leg pain.  No recent or upcoming dental visits.  Patient tolerated injection with no complaints.  Site clean and dry with no bruising or swelling noted at site.  Band aid applied.  VSS with discharge and left ambulatory with no s/s of distress noted.  Patient tolerated B12 injection with no complaints voiced.  Site clean and dry with no bruising or swelling noted at site.  Band aid applied.  VSS with discharge and left ambulatory with no s/s of distress noted.

## 2020-07-07 ENCOUNTER — Inpatient Hospital Stay (HOSPITAL_COMMUNITY): Payer: Medicare Other | Attending: Medical

## 2020-07-07 ENCOUNTER — Other Ambulatory Visit: Payer: Self-pay

## 2020-07-07 ENCOUNTER — Encounter (HOSPITAL_COMMUNITY): Payer: Self-pay

## 2020-07-07 ENCOUNTER — Other Ambulatory Visit (HOSPITAL_COMMUNITY): Payer: Self-pay | Admitting: Hematology

## 2020-07-07 VITALS — BP 110/72 | HR 83 | Temp 97.1°F | Resp 18

## 2020-07-07 DIAGNOSIS — M899 Disorder of bone, unspecified: Secondary | ICD-10-CM

## 2020-07-07 DIAGNOSIS — C61 Malignant neoplasm of prostate: Secondary | ICD-10-CM | POA: Diagnosis present

## 2020-07-07 DIAGNOSIS — D631 Anemia in chronic kidney disease: Secondary | ICD-10-CM | POA: Insufficient documentation

## 2020-07-07 DIAGNOSIS — N189 Chronic kidney disease, unspecified: Secondary | ICD-10-CM | POA: Diagnosis not present

## 2020-07-07 DIAGNOSIS — C7951 Secondary malignant neoplasm of bone: Secondary | ICD-10-CM | POA: Diagnosis not present

## 2020-07-07 DIAGNOSIS — E538 Deficiency of other specified B group vitamins: Secondary | ICD-10-CM

## 2020-07-07 LAB — CBC WITH DIFFERENTIAL/PLATELET
Abs Immature Granulocytes: 0.03 10*3/uL (ref 0.00–0.07)
Basophils Absolute: 0 10*3/uL (ref 0.0–0.1)
Basophils Relative: 0 %
Eosinophils Absolute: 0.1 10*3/uL (ref 0.0–0.5)
Eosinophils Relative: 2 %
HCT: 35.1 % — ABNORMAL LOW (ref 39.0–52.0)
Hemoglobin: 11.3 g/dL — ABNORMAL LOW (ref 13.0–17.0)
Immature Granulocytes: 1 %
Lymphocytes Relative: 32 %
Lymphs Abs: 1.8 10*3/uL (ref 0.7–4.0)
MCH: 30.1 pg (ref 26.0–34.0)
MCHC: 32.2 g/dL (ref 30.0–36.0)
MCV: 93.6 fL (ref 80.0–100.0)
Monocytes Absolute: 0.8 10*3/uL (ref 0.1–1.0)
Monocytes Relative: 13 %
Neutro Abs: 3 10*3/uL (ref 1.7–7.7)
Neutrophils Relative %: 52 %
Platelets: 281 10*3/uL (ref 150–400)
RBC: 3.75 MIL/uL — ABNORMAL LOW (ref 4.22–5.81)
RDW: 16.3 % — ABNORMAL HIGH (ref 11.5–15.5)
WBC: 5.7 10*3/uL (ref 4.0–10.5)
nRBC: 0 % (ref 0.0–0.2)

## 2020-07-07 LAB — COMPREHENSIVE METABOLIC PANEL
ALT: 11 U/L (ref 0–44)
AST: 18 U/L (ref 15–41)
Albumin: 3.4 g/dL — ABNORMAL LOW (ref 3.5–5.0)
Alkaline Phosphatase: 57 U/L (ref 38–126)
Anion gap: 9 (ref 5–15)
BUN: 17 mg/dL (ref 8–23)
CO2: 25 mmol/L (ref 22–32)
Calcium: 8.9 mg/dL (ref 8.9–10.3)
Chloride: 104 mmol/L (ref 98–111)
Creatinine, Ser: 1.54 mg/dL — ABNORMAL HIGH (ref 0.61–1.24)
GFR, Estimated: 47 mL/min — ABNORMAL LOW (ref >60)
Glucose, Bld: 95 mg/dL (ref 70–99)
Potassium: 3.6 mmol/L (ref 3.5–5.1)
Sodium: 138 mmol/L (ref 135–145)
Total Bilirubin: 0.5 mg/dL (ref 0.3–1.2)
Total Protein: 7.7 g/dL (ref 6.5–8.1)

## 2020-07-07 LAB — PSA: Prostatic Specific Antigen: 0.21 ng/mL (ref 0.00–4.00)

## 2020-07-07 MED ORDER — DENOSUMAB 120 MG/1.7ML ~~LOC~~ SOLN
120.0000 mg | Freq: Once | SUBCUTANEOUS | Status: AC
  Administered 2020-07-07: 120 mg via SUBCUTANEOUS
  Filled 2020-07-07: qty 1.7

## 2020-07-07 MED ORDER — SODIUM CHLORIDE 0.9% FLUSH
10.0000 mL | Freq: Once | INTRAVENOUS | Status: AC
  Administered 2020-07-07: 10 mL via INTRAVENOUS

## 2020-07-07 MED ORDER — ABIRATERONE ACETATE 250 MG PO TABS: 1000.0000 mg | ORAL_TABLET | Freq: Every day | ORAL | 0 refills | Status: DC

## 2020-07-07 MED ORDER — CYANOCOBALAMIN 1000 MCG/ML IJ SOLN
1000.0000 ug | Freq: Once | INTRAMUSCULAR | Status: AC
  Administered 2020-07-07: 1000 ug via INTRAMUSCULAR
  Filled 2020-07-07: qty 1

## 2020-07-07 MED ORDER — HEPARIN SOD (PORK) LOCK FLUSH 100 UNIT/ML IV SOLN
500.0000 [IU] | Freq: Once | INTRAVENOUS | Status: AC
  Administered 2020-07-07: 500 [IU] via INTRAVENOUS

## 2020-07-07 NOTE — Progress Notes (Signed)
Patient taking calcium as directed.  Denied tooth, jaw, and leg pain.  No recent or upcoming dental visits.  Labs reviewed.  Patient tolerated injection with no complaints voiced.  See MAR for details.  Patient stable during and after injection.  Site clean and dry with no bruising or swelling noted.  Band aid applied.  Vss with discharge and left ambulatory with no s/s of distress noted.  

## 2020-07-07 NOTE — Patient Instructions (Signed)
[facility] at [facility]  Discharge Instructions:   _______________________________________________________________  Thank you for choosing [facility] at [facility] to provide your oncology and hematology care.  To afford each patient quality time with our providers, please arrive at least 15 minutes before your scheduled appointment.  You need to re-schedule your appointment if you arrive 10 or more minutes late.  We strive to give you quality time with our providers, and arriving late affects you and other patients whose appointments are after yours.  Also, if you no show three or more times for appointments you may be dismissed from the clinic.  Again, thank you for choosing [facility] at [facility]. Our hope is that these requests will allow you access to exceptional care and in a timely manner. _______________________________________________________________  If you have questions after your visit, please contact our office at [phone] between the hours of 8:30 a.m. and 5:00 p.m. Voicemails left after 4:30 p.m. will not be returned until the following business day. _______________________________________________________________  For prescription refill requests, have your pharmacy contact our office. _______________________________________________________________  Recommendations made by the consultant and any test results will be sent to your referring physician. _______________________________________________________________ 

## 2020-07-11 ENCOUNTER — Other Ambulatory Visit (HOSPITAL_COMMUNITY): Payer: Self-pay

## 2020-07-18 ENCOUNTER — Other Ambulatory Visit (HOSPITAL_COMMUNITY): Payer: Self-pay

## 2020-07-31 ENCOUNTER — Other Ambulatory Visit (HOSPITAL_COMMUNITY): Payer: Self-pay

## 2020-08-07 ENCOUNTER — Inpatient Hospital Stay (HOSPITAL_COMMUNITY): Payer: Medicare Other | Attending: Hematology

## 2020-08-07 ENCOUNTER — Other Ambulatory Visit: Payer: Self-pay

## 2020-08-07 ENCOUNTER — Inpatient Hospital Stay (HOSPITAL_COMMUNITY): Payer: Medicare Other

## 2020-08-07 ENCOUNTER — Other Ambulatory Visit (HOSPITAL_COMMUNITY): Payer: Self-pay

## 2020-08-07 ENCOUNTER — Inpatient Hospital Stay (HOSPITAL_BASED_OUTPATIENT_CLINIC_OR_DEPARTMENT_OTHER): Payer: Medicare Other | Admitting: Hematology

## 2020-08-07 VITALS — BP 107/82 | HR 111 | Temp 97.1°F | Resp 19 | Wt 152.6 lb

## 2020-08-07 DIAGNOSIS — D631 Anemia in chronic kidney disease: Secondary | ICD-10-CM | POA: Insufficient documentation

## 2020-08-07 DIAGNOSIS — C61 Malignant neoplasm of prostate: Secondary | ICD-10-CM

## 2020-08-07 DIAGNOSIS — N189 Chronic kidney disease, unspecified: Secondary | ICD-10-CM | POA: Diagnosis not present

## 2020-08-07 DIAGNOSIS — C7951 Secondary malignant neoplasm of bone: Secondary | ICD-10-CM | POA: Insufficient documentation

## 2020-08-07 DIAGNOSIS — M899 Disorder of bone, unspecified: Secondary | ICD-10-CM

## 2020-08-07 DIAGNOSIS — E559 Vitamin D deficiency, unspecified: Secondary | ICD-10-CM | POA: Diagnosis not present

## 2020-08-07 DIAGNOSIS — E538 Deficiency of other specified B group vitamins: Secondary | ICD-10-CM

## 2020-08-07 DIAGNOSIS — Z7982 Long term (current) use of aspirin: Secondary | ICD-10-CM | POA: Diagnosis not present

## 2020-08-07 LAB — CBC WITH DIFFERENTIAL/PLATELET
Abs Immature Granulocytes: 0.03 10*3/uL (ref 0.00–0.07)
Basophils Absolute: 0 10*3/uL (ref 0.0–0.1)
Basophils Relative: 0 %
Eosinophils Absolute: 0 10*3/uL (ref 0.0–0.5)
Eosinophils Relative: 0 %
HCT: 36 % — ABNORMAL LOW (ref 39.0–52.0)
Hemoglobin: 11.7 g/dL — ABNORMAL LOW (ref 13.0–17.0)
Immature Granulocytes: 1 %
Lymphocytes Relative: 15 %
Lymphs Abs: 0.8 10*3/uL (ref 0.7–4.0)
MCH: 30.2 pg (ref 26.0–34.0)
MCHC: 32.5 g/dL (ref 30.0–36.0)
MCV: 93 fL (ref 80.0–100.0)
Monocytes Absolute: 0.4 10*3/uL (ref 0.1–1.0)
Monocytes Relative: 8 %
Neutro Abs: 4.1 10*3/uL (ref 1.7–7.7)
Neutrophils Relative %: 76 %
Platelets: 259 10*3/uL (ref 150–400)
RBC: 3.87 MIL/uL — ABNORMAL LOW (ref 4.22–5.81)
RDW: 16.5 % — ABNORMAL HIGH (ref 11.5–15.5)
WBC: 5.4 10*3/uL (ref 4.0–10.5)
nRBC: 0 % (ref 0.0–0.2)

## 2020-08-07 LAB — COMPREHENSIVE METABOLIC PANEL
ALT: 12 U/L (ref 0–44)
AST: 21 U/L (ref 15–41)
Albumin: 3.7 g/dL (ref 3.5–5.0)
Alkaline Phosphatase: 64 U/L (ref 38–126)
Anion gap: 12 (ref 5–15)
BUN: 28 mg/dL — ABNORMAL HIGH (ref 8–23)
CO2: 22 mmol/L (ref 22–32)
Calcium: 9.1 mg/dL (ref 8.9–10.3)
Chloride: 104 mmol/L (ref 98–111)
Creatinine, Ser: 1.66 mg/dL — ABNORMAL HIGH (ref 0.61–1.24)
GFR, Estimated: 43 mL/min — ABNORMAL LOW (ref >60)
Glucose, Bld: 103 mg/dL — ABNORMAL HIGH (ref 70–99)
Potassium: 4.1 mmol/L (ref 3.5–5.1)
Sodium: 138 mmol/L (ref 135–145)
Total Bilirubin: 0.6 mg/dL (ref 0.3–1.2)
Total Protein: 8.3 g/dL — ABNORMAL HIGH (ref 6.5–8.1)

## 2020-08-07 LAB — PSA: Prostatic Specific Antigen: 0.31 ng/mL (ref 0.00–4.00)

## 2020-08-07 MED ORDER — CYANOCOBALAMIN 1000 MCG/ML IJ SOLN
1000.0000 ug | Freq: Once | INTRAMUSCULAR | Status: AC
  Administered 2020-08-07: 1000 ug via INTRAMUSCULAR
  Filled 2020-08-07: qty 1

## 2020-08-07 MED ORDER — DENOSUMAB 120 MG/1.7ML ~~LOC~~ SOLN
120.0000 mg | Freq: Once | SUBCUTANEOUS | Status: AC
  Administered 2020-08-07: 120 mg via SUBCUTANEOUS
  Filled 2020-08-07: qty 1.7

## 2020-08-07 NOTE — Progress Notes (Signed)
Jeff [name] presents today for office visit and  injection per the provider's orders.  Xgeva and Vitamin B-12 administration without incident to Left Arm ; injection site WNL; see MAR for injection details.  Patient remained stable during administration.  No questions or complaints noted at this time.  Patient discharged ambulatory and in stable condition.

## 2020-08-07 NOTE — Progress Notes (Signed)
[place], [place] [id number]   CLINIC:  Medical Oncology/Hematology  PCP:  Patient, No Pcp Per (Inactive) None None   REASON FOR VISIT:  Follow-up for metastatic prostate cancer  PRIOR THERAPY:  1. Bilateral orchiectomy on 02/05/2014. 2. Docetaxel x 6 cycles from 03/14/2014 to 06/26/2014. 3. Cabazitaxel x 55 cycles from 05/03/2016 to 12/17/2019.  NGS Results: Guardant 360 MSI high not detected  CURRENT THERAPY: Zytiga 1,000 mg daily  BRIEF ONCOLOGIC HISTORY:  Oncology History  Malignant neoplasm of prostate ([place])  01/26/2014 Tumor Marker   PSA > 5000   01/28/2014 Initial Biopsy   Metastatic adenocarcinoma of prostate   02/05/2014 Surgery   Bilateral orchiectomy by Dr. Junious Silk   02/26/2014 Tumor Marker   PSA = 399.5   03/14/2014 Tumor Marker   PSA= 89.51   03/14/2014 - 06/26/2014 Chemotherapy   Docetaxel 75 mg/kg every 21 days x 6 cycles   04/24/2014 Tumor Marker   PSA- 19.89   07/23/2014 Procedure   Nephrostomy tube removed by IR, Dr. Geroge Baseman   07/24/2014 Tumor Marker   PSA= 6.15   10/16/2014 Tumor Marker   PSA: 3.54    01/08/2015 Tumor Marker   PSA: 2.13    01/17/2015 Imaging   CT CAP-  Massive pelvic and retroperitoneal lymphadenopathy noted on the prior study has nearly completely resolved, now with only a small amount of residual amorphous soft tissue predominantly around the the infrarenal abdominal aorta.   01/17/2015 Imaging   Bone scan- Widespread osseous metastatic disease with multiple foci of increased activity throughout the skeleton, corresponding with the findings on the prior CT from October, 2015.   04/11/2015 Tumor Marker   PSA: 1.87    07/21/2015 Imaging   Bone scan- Bony metastatic disease again noted at multiple sites, stable from prior study. No progression of bony metastatic disease is demonstrable on this study.   07/25/2015 Imaging   CT CAP- Stable matted soft tissue density in the  retroperitoneum and extraperitoneal pelvis consistent with treated disease. No recurrent lymphadenopathy.  No pulmonary metastatic disease. Diffuse stable sclerotic metastatic bone disease.   01/14/2016 Imaging   Bone scan-  Multiple sites of abnormal increased tracer localization involving BILATERAL ribs, T11, T12, questionably RIGHT scapula and LEFT iliac bone suspicious for osseous metastases.   01/23/2016 Imaging   CT CAP- 1. Similar widespread osseous metastasis. 2. Similar soft tissue thickening within the retroperitoneum of the abdomen. Improved left pelvic side wall soft tissue thickening. These are consistent with sites of treated disease. No well-defined adenopathy. 3. No new sites of disease.   05/03/2016 -  Chemotherapy   Jevtana every 21 days    05/03/2016 - 12/17/2019 Chemotherapy   The patient had pegfilgrastim (NEULASTA) injection 6 mg, 6 mg, Subcutaneous,  Once, 1 of 1 cycle Administration: 6 mg (05/05/2016) pegfilgrastim (NEULASTA ONPRO KIT) injection 6 mg, 6 mg, Subcutaneous, Once, 54 of 59 cycles Administration: 6 mg (05/24/2016), 6 mg (06/14/2016), 6 mg (07/07/2016), 6 mg (07/28/2016), 6 mg (08/18/2016), 6 mg (09/08/2016), 6 mg (09/29/2016), 6 mg (10/20/2016), 6 mg (11/10/2016), 6 mg (02/14/2017), 6 mg (03/07/2017), 6 mg (03/28/2017), 6 mg (04/20/2017), 6 mg (05/11/2017), 6 mg (06/27/2017), 6 mg (08/30/2017), 6 mg (09/20/2017), 6 mg (10/11/2017), 6 mg (11/02/2017), 6 mg (08/09/2017), 6 mg (11/23/2017), 6 mg (12/14/2017), 6 mg (01/09/2018), 6 mg (01/31/2018), 6 mg (04/04/2018), 6 mg (04/27/2018), 6 mg (05/18/2018), 6 mg (06/08/2018), 6 mg (06/29/2018), 6 mg (07/20/2018), 6 mg (08/10/2018), 6 mg (09/01/2018),  6 mg (09/22/2018), 6 mg (10/13/2018), 6 mg (11/03/2018), 6 mg (11/24/2018), 6 mg (12/15/2018), 6 mg (01/05/2019), 6 mg (01/26/2019), 6 mg (02/16/2019), 6 mg (03/12/2019), 6 mg (04/02/2019), 6 mg (04/23/2019), 6 mg (05/14/2019), 6 mg (06/04/2019), 6 mg (06/25/2019), 6 mg (07/16/2019), 6 mg (08/16/2019), 6 mg (09/06/2019), 6 mg  (09/27/2019), 6 mg (10/18/2019), 6 mg (12/17/2019) cabazitaxel (JEVTANA) 41 mg in dextrose 5 % 250 mL chemo infusion, 20 mg/m2 = 41 mg (100 % of original dose 20 mg/m2), Intravenous,  Once, 55 of 60 cycles Dose modification: 20 mg/m2 (original dose 20 mg/m2, Cycle 1, Reason: Dose not tolerated, Comment: recommended by Data to use 20mg/m), 20 mg/m2 (original dose 20 mg/m2, Cycle 37, Reason: Other (see comments), Comment: Changed to new orderable), 21 mg/m2 (original dose 20 mg/m2, Cycle 40, Reason: Other (see comments)), 20 mg/m2 (original dose 20 mg/m2, Cycle 40, Reason: Other (see comments)) Administration: 41 mg (05/03/2016), 41 mg (05/24/2016), 41 mg (06/14/2016), 41 mg (07/07/2016), 41 mg (07/28/2016), 41 mg (08/18/2016), 41 mg (09/08/2016), 41 mg (09/29/2016), 41 mg (10/20/2016), 41 mg (11/10/2016), 41 mg (02/14/2017), 41 mg (03/07/2017), 41 mg (03/28/2017), 41 mg (04/20/2017), 41 mg (05/11/2017), 41 mg (06/27/2017), 41 mg (08/30/2017), 41 mg (09/20/2017), 41 mg (10/11/2017), 41 mg (11/02/2017), 41 mg (08/09/2017), 41 mg (11/23/2017), 41 mg (12/14/2017), 41 mg (01/09/2018), 41 mg (01/31/2018), 41 mg (02/21/2018), 41 mg (04/04/2018), 41 mg (04/27/2018), 41 mg (05/18/2018), 41 mg (06/08/2018), 41 mg (06/29/2018), 41 mg (07/20/2018), 41 mg (08/10/2018), 41 mg (09/01/2018), 41 mg (09/22/2018), 41 mg (10/13/2018), 41 mg (11/03/2018), 41 mg (11/24/2018), 41 mg (12/15/2018), 41 mg (01/05/2019), 41 mg (01/26/2019), 41 mg (02/16/2019), 41 mg (03/12/2019), 41 mg (04/02/2019), 41 mg (04/23/2019), 41 mg (05/14/2019), 41 mg (06/04/2019), 41 mg (06/25/2019), 41 mg (07/16/2019), 41 mg (08/16/2019), 41 mg (09/06/2019), 41 mg (09/27/2019), 41 mg (10/18/2019), 41 mg (12/17/2019)  for chemotherapy treatment.    09/06/2016 Imaging   Restaging CT C/A/P: IMPRESSION: 1. Similar appearance of widespread sclerotic bone metastases. 2. No change in soft tissue thickening within the retroperitoneum and left pelvic sidewall. No well defined adenopathy identified. 3. No new sites of  disease 4. Aortic Atherosclerosis (ICD10-I70.0). Coronary artery calcifications noted. 5. 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 6. Stable 9 mm pancreatic cystic lesion. Favor pseudocyst. Indolent neoplasm may look similar. Attention on follow-up imaging.   09/06/2016 Imaging   Bone Scan: IMPRESSION: In this patient with known diffuse sclerotic metastatic disease, bone scan does not reveal progression of radiotracer uptake. Several of the areas of previously demonstrated radiotracer uptake appear less prominent.  Change in appearance of radiotracer uptake involving the mandible now more notable on the right and previously more notable on the left may reflect result of dental disease rather than metastatic disease.    04/08/2017 Tumor Marker   PSA 0.89   01/07/2020 Genetic Testing   Guardant 360 CDx Results:         CANCER STAGING: Cancer Staging No matching staging information was found for the patient.  INTERVAL HISTORY:  Jeff [name], a 74 y.o. male, returns for routine follow-up of his metastatic prostate cancer. Jeff [name] was last seen on 06/09/2020.   Today he reports feeling okay. He is taking Zytiga 1,000 mg daily and is tolerating it well; he denies having N/V/D/C. He complains of having trouble falling sleeping; he has not tried taking melatonin. He denies having any jaw pain. He reports that he drinks water. He is taking prednisone 5 mg with breakfast   and ASA 81 daily. His appetite is excellent.   REVIEW OF SYSTEMS:  Review of Systems  Constitutional: Positive for fatigue (75%). Negative for appetite change.  Gastrointestinal: Negative for constipation, diarrhea, nausea and vomiting.  Musculoskeletal: Negative for arthralgias.  Psychiatric/Behavioral: Positive for sleep disturbance (trouble falling asleep).  All other systems reviewed and are negative.   PAST MEDICAL/SURGICAL HISTORY:  Past  Medical History:  Diagnosis Date  . Acute renal failure ([place]) 01/26/2014  . Alcohol abuse   . Anemia of chronic disease 01/26/2014  . Bilateral hydronephrosis 01/26/2014  . History of cocaine abuse ([place]) 01/26/2014  . Homelessness 01/31/2014  . Prostate cancer metastatic to multiple sites ([place]) 01/30/2014  . Sepsis ([place])   . UTI (lower urinary tract infection)    Past Surgical History:  Procedure Laterality Date  . ORCHIECTOMY Bilateral 02/05/2014   Procedure: BILATERAL ORCHIECTOMY;  Surgeon: Festus Aloe, MD;  Location: [facility];  Service: Urology;  Laterality: Bilateral;  . PERCUTANEOUS NEPHROSTOMY Bilateral    IR Dr. Junious Silk changed on 04/22/2014  . PORT-A-CATH REMOVAL Right 08/08/2017   Procedure: REMOVAL PORT-A-CATH RIGHT CHEST (PROCEDURE #2);  Surgeon: Aviva Signs, MD;  Location: [facility];  Service: General;  Laterality: Right;  . PORTACATH PLACEMENT Right 03/12/14  . PORTACATH PLACEMENT Left 08/08/2017   Procedure: INSERTION POWER PORT WITH ATTACHED CATHETER LEFT SUBCLAVIAN (PROCEDURE #1);  Surgeon: Aviva Signs, MD;  Location: [facility];  Service: General;  Laterality: Left;    SOCIAL HISTORY:  Social History   Socioeconomic History  . Marital status: Single    Spouse name: Not on file  . Number of children: 1  . Years of education: 9th  . Highest education level: Not on file  Occupational History  . Occupation: disabilty    Employer: [employer]  Tobacco Use  . Smoking status: Current Every Day Smoker    Packs/day: 0.25    Years: 55.00    Pack years: 13.75    Types: Cigarettes  . Smokeless tobacco: Never Used  Vaping Use  . Vaping Use: Never used  Substance and Sexual Activity  . Alcohol use: Not Currently    Alcohol/week: 1.0 standard drink    Types: 1 Cans of beer per week  . Drug use: Yes    Types: Cocaine    Comment: last used Cocaine 01/25/14  . Sexual activity: Yes  Other Topics Concern  . Not on file  Social History Narrative   Lives at [employer] 9th grade   1 son, lives in [place], [place]   Can read and write in native language            Social Determinants of Health   Financial Resource Strain: Low Risk   . Difficulty of Paying Living Expenses: Not hard at all  Food Insecurity: No Food Insecurity  . Worried About Charity fundraiser in the Last Year: Never true  . Ran Out of Food in the Last Year: Never true  Transportation Needs: No Transportation Needs  . Lack of Transportation (Medical): No  . Lack of Transportation (Non-Medical): No  Physical Activity: Inactive  . Days of Exercise per Week: 0 days  . Minutes of Exercise per Session: 0 min  Stress: No Stress Concern Present  . Feeling of Stress : Not at all  Social Connections: Socially Isolated  . Frequency of Communication with Friends and Family: More than three times a week  . Frequency of Social Gatherings with Friends and Family:  Three times a week  . Attends Religious Services: Never  . Active Member of Clubs or Organizations: No  . Attends Archivist Meetings: Never  . Marital Status: Never married  Intimate Partner Violence: Not At Risk  . Fear of Current or Ex-Partner: No  . Emotionally Abused: No  . Physically Abused: No  . Sexually Abused: No    FAMILY HISTORY:  Family History  Problem Relation Age of Onset  . Cancer Brother 13    CURRENT MEDICATIONS:  Current Outpatient Medications  Medication Sig Dispense Refill  . abiraterone acetate (ZYTIGA) 250 MG tablet TAKE 4 TABLETS (1,000 MG TOTAL) BY MOUTH DAILY. TAKE ON AN EMPTY STOMACH 1 HOUR BEFORE OR 2 HOURS AFTER A MEAL (Patient taking differently: Take by mouth daily. Take on an empty stomach 1 hour before or 2 hours after a meal.) 120 tablet 0  . aspirin EC 81 MG EC tablet Take 1 tablet (81 mg total) by mouth daily. Swallow whole. 30 tablet 11  . atorvastatin (LIPITOR) 40 MG tablet Take 1 tablet (40 mg total) by mouth daily.    . famotidine (PEPCID) 10 MG tablet  Take 1 tablet (10 mg total) by mouth daily as needed for heartburn or indigestion.    . Menthol-Methyl Salicylate (MUSCLE RUB) 10-15 % CREA Apply 1 application topically 2 (two) times daily as needed for muscle pain. To back and hips  0  . midodrine (PROAMATINE) 10 MG tablet Take 1 tablet (10 mg total) by mouth 3 (three) times daily with meals.    . predniSONE (DELTASONE) 5 MG tablet Take 1 tablet (5 mg total) by mouth daily with breakfast. 30 tablet 6  . predniSONE (DELTASONE) 5 MG tablet TAKE 1 TABLET BY MOUTH DAILY WITH BREAKFAST 30 tablet 6  . simethicone (MYLICON) 80 MG chewable tablet Chew 1 tablet (80 mg total) by mouth 4 (four) times daily as needed for flatulence. 30 tablet 0   No current facility-administered medications for this visit.   Facility-Administered Medications Ordered in Other Visits  Medication Dose Route Frequency Provider Last Rate Last Admin  . cyanocobalamin ((VITAMIN B-12)) injection 1,000 mcg  1,000 mcg Intramuscular Once Kefalas, Thomas S, PA-C        ALLERGIES:  No Active Allergies  PHYSICAL EXAM:  Performance status (ECOG): 1 - Symptomatic but completely ambulatory  Vitals:   08/07/20 1437  BP: 107/82  Pulse: (!) 111  Resp: 19  Temp: (!) 97.1 F (36.2 C)  SpO2: 96%   Wt Readings from Last 3 Encounters:  08/07/20 152 lb 9.6 oz (69.2 kg)  06/09/20 164 lb 6.4 oz (74.6 kg)  04/14/20 157 lb 8 oz (71.4 kg)   Physical Exam Vitals reviewed.  Constitutional:      Appearance: Normal appearance.  Cardiovascular:     Rate and Rhythm: Normal rate and regular rhythm.     Pulses: Normal pulses.     Heart sounds: Normal heart sounds.  Pulmonary:     Effort: Pulmonary effort is normal.     Breath sounds: Normal breath sounds.  Musculoskeletal:     Right lower leg: No edema.     Left lower leg: No edema.  Neurological:     General: No focal deficit present.     Mental Status: He is alert and oriented to person, place, and time.  Psychiatric:         Mood and Affect: Mood normal.        Behavior: Behavior normal.  LABORATORY DATA:  I have reviewed the labs as listed.  CBC Latest Ref Rng & Units 08/07/2020 07/07/2020 06/09/2020  WBC 4.0 - 10.5 K/uL 5.4 5.7 7.2  Hemoglobin 13.0 - 17.0 g/dL 11.7(L) 11.3(L) 11.6(L)  Hematocrit 39.0 - 52.0 % 36.0(L) 35.1(L) 37.2(L)  Platelets 150 - 400 K/uL 259 281 315   CMP Latest Ref Rng & Units 08/07/2020 07/07/2020 06/09/2020  Glucose 70 - 99 mg/dL 103(H) 95 105(H)  BUN 8 - 23 mg/dL 28(H) 17 20  Creatinine 0.61 - 1.24 mg/dL 1.66(H) 1.54(H) 1.35(H)  Sodium 135 - 145 mmol/L 138 138 137  Potassium 3.5 - 5.1 mmol/L 4.1 3.6 4.2  Chloride 98 - 111 mmol/L 104 104 101  CO2 22 - 32 mmol/L _0 Calcium 8.9 - 10.3 mg/dL 9.1 8.9 9.4  Total Protein 6.5 - 8.1 g/dL 8.3(H) 7.7 7.8  Total Bilirubin 0.3 - 1.2 mg/dL 0.6 0.5 0.4  Alkaline Phos 38 - 126 U/L 64 57 55  AST 15 - 41 U/L _1 ALT 0 - 44 U/L _2 DIAGNOSTIC IMAGING:  I have independently reviewed the scans and discussed with the patient. No results found.   ASSESSMENT:  1. 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 -6 cycles of docetaxel from 03/14/2014 through 06/26/2014. -Cabazitaxel every 3 weeks started on 05/03/2016. -Baseline PSA between 0.7 and 1. -Bone scan on 08/03/2019 showed uptake in the right mandible, slightly more progressive than prior study in April 2019. Multiple additional studies in the ribs and pelvis are not identified. Uptake in the left maxilla, question dental disease. -CT CAP on 08/03/2019 did not show any significant change from August 2018 CT study. No new or progressive disease. Stable extensive sclerotic bone lesions throughout the axial and proximal appendicular skeleton. Chronic pathological mid T3 vertebral fracture. No pathologically enlarged abdominal lymph nodes. -Bone scan on 01/17/2020 showed multiple foci of progressive metastatic disease,  within the mandible, left 3rd rib anteriorly, mid and lower thoracic spine, left aspect of L5 vertebral body and sternal body. -CTAP on 01/10/2020 showed unchanged posttreatment soft tissue thickening about the inferior aorta and vena cava. No discretely enlarged abdominal or pelvic lymph nodes. Mild left hydronephrosis and hydroureter. -PSA on 01/21/2020 increased to 2.33. -Abiraterone with prednisone started around 01/24/2020. -Guardant 360 showed MSI high not detected. No other relevant targetable mutations.  2. Left-sided stroke with right hemiparesis: -Hospitalized from 10/25/2019 through 11/20/2019 with right-sided weakness, right facial droop, MRI/MRA revealing acute left caudate/putamen infarct. MRA showed occlusion of the superior division of the left MCA M2 branch. Urine cocaine was positive. -He underwent rehab and most of his strength was regained.   PLAN:  1. 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 -Continue prednisone 5 mg daily. -Last PSA was 0.21, slightly improved from prior value of 0.23. -Continue Abiraterone at the same dose. -Will evaluate in 8 weeks with repeat PSA and routine labs.  2. Bone metastasis: -Calcium today is 9.1.  Continue calcium supplements.  He will receive denosumab monthly.  3. Renal insufficiency: -Creatinine has increased to 1.66 today.  I have encouraged him to drink at least 2 to 3 L of water daily.  4. Normocytic anemia: -Combination anemia from CKD and relative iron deficiency. -Hemoglobin today is 11.7.  If there is any further deterioration, will check ferritin and iron panel. -Continue monthly B12 injections for B12 deficiency.  5. Vitamin D deficiency: -Continue vitamin D supplements.  6. CVA: -Continue aspirin 81 mg and Lipitor 40 mg daily.   Orders placed this encounter:  No orders of the defined types were placed in this  encounter.    Sreedhar Katragadda, MD [facility] [phone]   I, Daniel Khashchuk, am acting as a scribe for Dr. Sreedhar Katagadda.  I, Sreedhar Katragadda MD, have reviewed the above documentation for accuracy and completeness, and I agree with the above.     

## 2020-08-07 NOTE — Patient Instructions (Signed)
Jeff [name] at [facility] Discharge Instructions  You were seen today by Dr. Delton Coombes. He went over your recent results. You received your Xgeva and vitamin B12 injections today; continue getting both injections every month. Continue taking Zytiga 1,000 mg daily. Purchase melatonin over the counter and take 1 tablet in the afternoon to help you fall asleep. If taking 1 tablet after a week does not help, take 2 tablets. Drink 1 liter of water daily to keep your kidneys flushed. Dr. Delton Coombes will see you back in 2 months for labs and follow up.   Thank you for choosing [place] at [facility] to provide your oncology and hematology care.  To afford each patient quality time with our provider, please arrive at least 15 minutes before your scheduled appointment time.   If you have a lab appointment with the [facility] please come in thru the Main Entrance and check in at the main information desk  You need to re-schedule your appointment should you arrive 10 or more minutes late.  We strive to give you quality time with our providers, and arriving late affects you and other patients whose appointments are after yours.  Also, if you no show three or more times for appointments you may be dismissed from the clinic at the providers discretion.     Again, thank you for choosing [facility].  Our hope is that these requests will decrease the amount of time that you wait before being seen by our physicians.       _____________________________________________________________  Should you have questions after your visit to [facility], please contact our office at [phone] between the hours of 8:00 a.m. and 4:30 p.m.  Voicemails left after 4:00 p.m. will not be returned until the following business day.  For prescription refill requests, have your pharmacy contact our office and allow 72 hours.    Cancer Center Support  Programs:   > Cancer Support Group  2nd Tuesday of the month 1pm-2pm, Journey Room

## 2020-08-14 ENCOUNTER — Emergency Department (HOSPITAL_COMMUNITY): Payer: Medicare Other

## 2020-08-14 ENCOUNTER — Other Ambulatory Visit: Payer: Self-pay

## 2020-08-14 ENCOUNTER — Inpatient Hospital Stay (HOSPITAL_COMMUNITY)
Admission: EM | Admit: 2020-08-14 | Discharge: 2020-08-19 | DRG: 291 | Disposition: A | Discharge status: Hospice | Payer: Medicare Other | Attending: Internal Medicine | Admitting: Internal Medicine

## 2020-08-14 ENCOUNTER — Emergency Department (HOSPITAL_BASED_OUTPATIENT_CLINIC_OR_DEPARTMENT_OTHER): Payer: Medicare Other

## 2020-08-14 ENCOUNTER — Encounter (HOSPITAL_COMMUNITY): Payer: Self-pay | Admitting: *Deleted

## 2020-08-14 DIAGNOSIS — R627 Adult failure to thrive: Secondary | ICD-10-CM | POA: Diagnosis not present

## 2020-08-14 DIAGNOSIS — K761 Chronic passive congestion of liver: Secondary | ICD-10-CM | POA: Diagnosis present

## 2020-08-14 DIAGNOSIS — Z79899 Other long term (current) drug therapy: Secondary | ICD-10-CM

## 2020-08-14 DIAGNOSIS — M7989 Other specified soft tissue disorders: Secondary | ICD-10-CM

## 2020-08-14 DIAGNOSIS — C61 Malignant neoplasm of prostate: Secondary | ICD-10-CM | POA: Diagnosis present

## 2020-08-14 DIAGNOSIS — R0609 Other forms of dyspnea: Secondary | ICD-10-CM | POA: Diagnosis not present

## 2020-08-14 DIAGNOSIS — I5082 Biventricular heart failure: Secondary | ICD-10-CM | POA: Diagnosis present

## 2020-08-14 DIAGNOSIS — Z20822 Contact with and (suspected) exposure to covid-19: Secondary | ICD-10-CM | POA: Diagnosis not present

## 2020-08-14 DIAGNOSIS — J9811 Atelectasis: Secondary | ICD-10-CM | POA: Diagnosis present

## 2020-08-14 DIAGNOSIS — I4891 Unspecified atrial fibrillation: Secondary | ICD-10-CM | POA: Diagnosis present

## 2020-08-14 DIAGNOSIS — Z515 Encounter for palliative care: Secondary | ICD-10-CM

## 2020-08-14 DIAGNOSIS — Z66 Do not resuscitate: Secondary | ICD-10-CM | POA: Diagnosis not present

## 2020-08-14 DIAGNOSIS — R0902 Hypoxemia: Secondary | ICD-10-CM | POA: Diagnosis not present

## 2020-08-14 DIAGNOSIS — R570 Cardiogenic shock: Secondary | ICD-10-CM | POA: Diagnosis present

## 2020-08-14 DIAGNOSIS — I272 Pulmonary hypertension, unspecified: Secondary | ICD-10-CM | POA: Diagnosis present

## 2020-08-14 DIAGNOSIS — R0602 Shortness of breath: Secondary | ICD-10-CM | POA: Diagnosis not present

## 2020-08-14 DIAGNOSIS — I471 Supraventricular tachycardia: Secondary | ICD-10-CM | POA: Diagnosis not present

## 2020-08-14 DIAGNOSIS — C7951 Secondary malignant neoplasm of bone: Secondary | ICD-10-CM | POA: Diagnosis present

## 2020-08-14 DIAGNOSIS — Z8673 Personal history of transient ischemic attack (TIA), and cerebral infarction without residual deficits: Secondary | ICD-10-CM

## 2020-08-14 DIAGNOSIS — F1410 Cocaine abuse, uncomplicated: Secondary | ICD-10-CM | POA: Diagnosis present

## 2020-08-14 DIAGNOSIS — R778 Other specified abnormalities of plasma proteins: Secondary | ICD-10-CM | POA: Diagnosis present

## 2020-08-14 DIAGNOSIS — R6510 Systemic inflammatory response syndrome (SIRS) of non-infectious origin without acute organ dysfunction: Secondary | ICD-10-CM | POA: Diagnosis present

## 2020-08-14 DIAGNOSIS — N183 Chronic kidney disease, stage 3 unspecified: Secondary | ICD-10-CM | POA: Diagnosis present

## 2020-08-14 DIAGNOSIS — R0603 Acute respiratory distress: Secondary | ICD-10-CM

## 2020-08-14 DIAGNOSIS — J9601 Acute respiratory failure with hypoxia: Secondary | ICD-10-CM | POA: Diagnosis not present

## 2020-08-14 DIAGNOSIS — I517 Cardiomegaly: Secondary | ICD-10-CM | POA: Diagnosis not present

## 2020-08-14 DIAGNOSIS — E872 Acidosis, unspecified: Secondary | ICD-10-CM | POA: Diagnosis present

## 2020-08-14 DIAGNOSIS — R590 Localized enlarged lymph nodes: Secondary | ICD-10-CM | POA: Diagnosis not present

## 2020-08-14 DIAGNOSIS — F17210 Nicotine dependence, cigarettes, uncomplicated: Secondary | ICD-10-CM | POA: Diagnosis present

## 2020-08-14 DIAGNOSIS — J90 Pleural effusion, not elsewhere classified: Secondary | ICD-10-CM | POA: Diagnosis not present

## 2020-08-14 DIAGNOSIS — N1832 Chronic kidney disease, stage 3b: Secondary | ICD-10-CM | POA: Diagnosis not present

## 2020-08-14 DIAGNOSIS — I5043 Acute on chronic combined systolic (congestive) and diastolic (congestive) heart failure: Secondary | ICD-10-CM | POA: Diagnosis not present

## 2020-08-14 DIAGNOSIS — I493 Ventricular premature depolarization: Secondary | ICD-10-CM | POA: Diagnosis not present

## 2020-08-14 DIAGNOSIS — Z681 Body mass index (BMI) 19 or less, adult: Secondary | ICD-10-CM | POA: Diagnosis not present

## 2020-08-14 DIAGNOSIS — I5023 Acute on chronic systolic (congestive) heart failure: Secondary | ICD-10-CM | POA: Diagnosis not present

## 2020-08-14 DIAGNOSIS — K7200 Acute and subacute hepatic failure without coma: Secondary | ICD-10-CM | POA: Diagnosis not present

## 2020-08-14 DIAGNOSIS — Z7982 Long term (current) use of aspirin: Secondary | ICD-10-CM

## 2020-08-14 DIAGNOSIS — I130 Hypertensive heart and chronic kidney disease with heart failure and stage 1 through stage 4 chronic kidney disease, or unspecified chronic kidney disease: Secondary | ICD-10-CM | POA: Diagnosis not present

## 2020-08-14 DIAGNOSIS — Z809 Family history of malignant neoplasm, unspecified: Secondary | ICD-10-CM

## 2020-08-14 DIAGNOSIS — N179 Acute kidney failure, unspecified: Secondary | ICD-10-CM | POA: Diagnosis not present

## 2020-08-14 DIAGNOSIS — Z9119 Patient's noncompliance with other medical treatment and regimen: Secondary | ICD-10-CM

## 2020-08-14 DIAGNOSIS — D631 Anemia in chronic kidney disease: Secondary | ICD-10-CM | POA: Diagnosis present

## 2020-08-14 DIAGNOSIS — I509 Heart failure, unspecified: Secondary | ICD-10-CM | POA: Insufficient documentation

## 2020-08-14 DIAGNOSIS — E43 Unspecified severe protein-calorie malnutrition: Secondary | ICD-10-CM | POA: Diagnosis present

## 2020-08-14 DIAGNOSIS — J811 Chronic pulmonary edema: Secondary | ICD-10-CM | POA: Diagnosis not present

## 2020-08-14 DIAGNOSIS — E871 Hypo-osmolality and hyponatremia: Secondary | ICD-10-CM | POA: Diagnosis present

## 2020-08-14 DIAGNOSIS — I429 Cardiomyopathy, unspecified: Secondary | ICD-10-CM | POA: Diagnosis present

## 2020-08-14 LAB — CBC WITH DIFFERENTIAL/PLATELET
Abs Immature Granulocytes: 0.04 10*3/uL (ref 0.00–0.07)
Basophils Absolute: 0 10*3/uL (ref 0.0–0.1)
Basophils Relative: 0 %
Eosinophils Absolute: 0 10*3/uL (ref 0.0–0.5)
Eosinophils Relative: 0 %
HCT: 35.7 % — ABNORMAL LOW (ref 39.0–52.0)
Hemoglobin: 11.6 g/dL — ABNORMAL LOW (ref 13.0–17.0)
Immature Granulocytes: 0 %
Lymphocytes Relative: 17 %
Lymphs Abs: 1.5 10*3/uL (ref 0.7–4.0)
MCH: 31.5 pg (ref 26.0–34.0)
MCHC: 32.5 g/dL (ref 30.0–36.0)
MCV: 97 fL (ref 80.0–100.0)
Monocytes Absolute: 0.8 10*3/uL (ref 0.1–1.0)
Monocytes Relative: 8 %
Neutro Abs: 6.9 10*3/uL (ref 1.7–7.7)
Neutrophils Relative %: 75 %
Platelets: 205 10*3/uL (ref 150–400)
RBC: 3.68 MIL/uL — ABNORMAL LOW (ref 4.22–5.81)
RDW: 18.9 % — ABNORMAL HIGH (ref 11.5–15.5)
WBC: 9.3 10*3/uL (ref 4.0–10.5)
nRBC: 3.1 % — ABNORMAL HIGH (ref 0.0–0.2)

## 2020-08-14 LAB — COOXEMETRY PANEL
Carboxyhemoglobin: 0.5 % (ref 0.5–1.5)
Methemoglobin: 1.1 % (ref 0.0–1.5)
O2 Saturation: 23 %
Total hemoglobin: 11.9 g/dL — ABNORMAL LOW (ref 12.0–16.0)

## 2020-08-14 LAB — COMPREHENSIVE METABOLIC PANEL
ALT: 66 U/L — ABNORMAL HIGH (ref 0–44)
AST: 106 U/L — ABNORMAL HIGH (ref 15–41)
Albumin: 3.6 g/dL (ref 3.5–5.0)
Alkaline Phosphatase: 182 U/L — ABNORMAL HIGH (ref 38–126)
Anion gap: 20 — ABNORMAL HIGH (ref 5–15)
BUN: 53 mg/dL — ABNORMAL HIGH (ref 8–23)
CO2: 12 mmol/L — ABNORMAL LOW (ref 22–32)
Calcium: 8 mg/dL — ABNORMAL LOW (ref 8.9–10.3)
Chloride: 104 mmol/L (ref 98–111)
Creatinine, Ser: 2.68 mg/dL — ABNORMAL HIGH (ref 0.61–1.24)
GFR, Estimated: 24 mL/min — ABNORMAL LOW (ref >60)
Glucose, Bld: 106 mg/dL — ABNORMAL HIGH (ref 70–99)
Potassium: 3.6 mmol/L (ref 3.5–5.1)
Sodium: 136 mmol/L (ref 135–145)
Total Bilirubin: 2.5 mg/dL — ABNORMAL HIGH (ref 0.3–1.2)
Total Protein: 7.8 g/dL (ref 6.5–8.1)

## 2020-08-14 LAB — ECHOCARDIOGRAM COMPLETE
AV Mean grad: 3 mmHg
AV Peak grad: 5.9 mmHg
Ao pk vel: 1.22 m/s
Area-P 1/2: 5.79 cm2
MV M vel: 4.59 m/s
MV Peak grad: 84.3 mmHg
S' Lateral: 5.61 cm

## 2020-08-14 LAB — BLOOD GAS, ARTERIAL
Acid-base deficit: 9.2 mmol/L — ABNORMAL HIGH (ref 0.0–2.0)
Bicarbonate: 16.4 mmol/L — ABNORMAL LOW (ref 20.0–28.0)
FIO2: 100
O2 Saturation: 32.5 %
Patient temperature: 37.2
pCO2 arterial: 28 mmHg — ABNORMAL LOW (ref 32.0–48.0)
pH, Arterial: 7.354 (ref 7.350–7.450)
pO2, Arterial: <31 mmHg — CL (ref 83.0–108.0)

## 2020-08-14 LAB — PROTIME-INR
INR: 1.6 — ABNORMAL HIGH (ref 0.8–1.2)
Prothrombin Time: 19.1 seconds — ABNORMAL HIGH (ref 11.4–15.2)

## 2020-08-14 LAB — LACTIC ACID, PLASMA
Lactic Acid, Venous: 7.9 mmol/L (ref 0.5–1.9)
Lactic Acid, Venous: 7.9 mmol/L (ref 0.5–1.9)

## 2020-08-14 LAB — PROCALCITONIN: Procalcitonin: 1.98 ng/mL

## 2020-08-14 LAB — RESP PANEL BY RT-PCR (FLU A&B, COVID) ARPGX2
Influenza A by PCR: NEGATIVE
Influenza B by PCR: NEGATIVE
SARS Coronavirus 2 by RT PCR: NEGATIVE

## 2020-08-14 LAB — APTT: aPTT: 50 seconds — ABNORMAL HIGH (ref 24–36)

## 2020-08-14 LAB — BRAIN NATRIURETIC PEPTIDE: B Natriuretic Peptide: >4500 pg/mL — ABNORMAL HIGH (ref 0.0–100.0)

## 2020-08-14 LAB — HEPARIN LEVEL (UNFRACTIONATED): Heparin Unfractionated: 0.64 IU/mL (ref 0.30–0.70)

## 2020-08-14 LAB — TROPONIN I (HIGH SENSITIVITY): Troponin I (High Sensitivity): 199 ng/L (ref <18)

## 2020-08-14 MED ORDER — MIDODRINE HCL 5 MG PO TABS
10.0000 mg | ORAL_TABLET | Freq: Three times a day (TID) | ORAL | Status: DC
  Administered 2020-08-15: 10 mg via ORAL
  Filled 2020-08-14: qty 2

## 2020-08-14 MED ORDER — ONDANSETRON HCL 4 MG/2ML IJ SOLN
4.0000 mg | Freq: Four times a day (QID) | INTRAMUSCULAR | Status: DC | PRN
  Administered 2020-08-18: 4 mg via INTRAVENOUS
  Filled 2020-08-14: qty 2

## 2020-08-14 MED ORDER — HEPARIN BOLUS VIA INFUSION
4000.0000 [IU] | Freq: Once | INTRAVENOUS | Status: AC
  Administered 2020-08-14: 4000 [IU] via INTRAVENOUS

## 2020-08-14 MED ORDER — VANCOMYCIN HCL 1500 MG/300ML IV SOLN
1500.0000 mg | Freq: Once | INTRAVENOUS | Status: AC
  Administered 2020-08-14: 1500 mg via INTRAVENOUS
  Filled 2020-08-14: qty 300

## 2020-08-14 MED ORDER — FUROSEMIDE 10 MG/ML IJ SOLN
20.0000 mg | Freq: Once | INTRAMUSCULAR | Status: AC
  Administered 2020-08-14: 20 mg via INTRAVENOUS
  Filled 2020-08-14: qty 2

## 2020-08-14 MED ORDER — ACETAMINOPHEN 325 MG PO TABS
650.0000 mg | ORAL_TABLET | Freq: Four times a day (QID) | ORAL | Status: DC | PRN
Start: 2020-08-14 — End: 2020-08-20
  Administered 2020-08-18: 650 mg via ORAL
  Filled 2020-08-14: qty 2

## 2020-08-14 MED ORDER — POTASSIUM CHLORIDE 20 MEQ PO PACK
40.0000 meq | PACK | Freq: Once | ORAL | Status: AC
  Administered 2020-08-14: 40 meq via ORAL
  Filled 2020-08-14: qty 2

## 2020-08-14 MED ORDER — ONDANSETRON HCL 4 MG PO TABS: 4.0000 mg | ORAL_TABLET | Freq: Four times a day (QID) | ORAL | Status: DC | PRN

## 2020-08-14 MED ORDER — SODIUM CHLORIDE 0.9 % IV SOLN
2.0000 g | INTRAVENOUS | Status: DC
  Administered 2020-08-14 – 2020-08-15 (×2): 2 g via INTRAVENOUS
  Filled 2020-08-14 (×2): qty 2

## 2020-08-14 MED ORDER — TECHNETIUM TO 99M ALBUMIN AGGREGATED
4.4000 | Freq: Once | INTRAVENOUS | Status: AC | PRN
  Administered 2020-08-14: 4.4 via INTRAVENOUS

## 2020-08-14 MED ORDER — ASPIRIN EC 81 MG PO TBEC
81.0000 mg | DELAYED_RELEASE_TABLET | Freq: Every day | ORAL | Status: DC
  Administered 2020-08-15 – 2020-08-17 (×3): 81 mg via ORAL
  Filled 2020-08-14 (×4): qty 1

## 2020-08-14 MED ORDER — POTASSIUM CHLORIDE CRYS ER 20 MEQ PO TBCR
40.0000 meq | EXTENDED_RELEASE_TABLET | Freq: Once | ORAL | Status: DC
  Filled 2020-08-14: qty 2

## 2020-08-14 MED ORDER — VANCOMYCIN HCL IN DEXTROSE 1-5 GM/200ML-% IV SOLN: 1000.0000 mg | INTRAVENOUS | Status: DC

## 2020-08-14 MED ORDER — METRONIDAZOLE IN NACL 5-0.79 MG/ML-% IV SOLN
500.0000 mg | Freq: Three times a day (TID) | INTRAVENOUS | Status: DC
  Administered 2020-08-14 – 2020-08-16 (×6): 500 mg via INTRAVENOUS
  Filled 2020-08-14 (×6): qty 100

## 2020-08-14 MED ORDER — DOBUTAMINE IN D5W 4-5 MG/ML-% IV SOLN
2.0000 ug/kg/min | INTRAVENOUS | Status: DC
  Administered 2020-08-14: 2 ug/kg/min via INTRAVENOUS
  Filled 2020-08-14: qty 250

## 2020-08-14 MED ORDER — FUROSEMIDE 10 MG/ML IJ SOLN
60.0000 mg | Freq: Once | INTRAMUSCULAR | Status: AC
  Administered 2020-08-14: 60 mg via INTRAVENOUS
  Filled 2020-08-14: qty 6

## 2020-08-14 MED ORDER — POLYETHYLENE GLYCOL 3350 17 G PO PACK: 17.0000 g | PACK | Freq: Every day | ORAL | Status: DC | PRN

## 2020-08-14 MED ORDER — ACETAMINOPHEN 650 MG RE SUPP: 650.0000 mg | Freq: Four times a day (QID) | RECTAL | Status: DC | PRN

## 2020-08-14 MED ORDER — HEPARIN (PORCINE) 25000 UT/250ML-% IV SOLN
1200.0000 [IU]/h | INTRAVENOUS | Status: DC
  Administered 2020-08-14 – 2020-08-16 (×3): 1200 [IU]/h via INTRAVENOUS
  Filled 2020-08-14 (×3): qty 250

## 2020-08-14 NOTE — Progress Notes (Signed)
ANTICOAGULATION CONSULT NOTE - Initial Consult  Pharmacy Consult for heparin Indication: chest pain/ACS/possible PE  No Known Allergies  Patient Measurements:   Heparin Dosing Weight: 69.2 kg  Vital Signs: Temp: 98.9 F (37.2 C) (04/21 1224) Temp Source: Rectal (04/21 1224) BP: 111/94 (04/21 1230) Pulse Rate: 109 (04/21 1230)  Labs: Recent Labs    08/14/20 1208  HGB 11.6*  HCT 35.7*  PLT 205  CREATININE 2.68*  TROPONINIHS 1,279*    Estimated Creatinine Clearance: 22.2 mL/min (A) (by C-G formula based on SCr of 2.68 mg/dL (H)).   Medical History: Past Medical History:  Diagnosis Date  . Acute renal failure ([place]) 01/26/2014  . Alcohol abuse   . Anemia of chronic disease 01/26/2014  . Bilateral hydronephrosis 01/26/2014  . History of cocaine abuse ([place]) 01/26/2014  . Homelessness 01/31/2014  . Prostate cancer metastatic to multiple sites ([place]) 01/30/2014  . Sepsis ([place])   . UTI (lower urinary tract infection)     Medications:  (Not in a hospital admission)   Assessment: Pharmacy consulted to dose heparin in patient high suspicion for PE due to hypoxia, tachycardia, tachypnea without a fever in the setting of cancer treatment.  Less likely ACS as patient is without pain.  Patient is not on anticoagulation prior to admission.   Troponin 1279 CBC WNL  Goal of Therapy:  Heparin level 0.3-0.7 units/ml Monitor platelets by anticoagulation protocol: Yes   Plan:  Give 4000 units bolus x 1 Start heparin infusion at 1200 units/hr Check anti-Xa level in 8 hours and daily while on heparin Continue to monitor H&H and platelets  Margot Ables, PharmD Clinical Pharmacist 08/14/2020 1:55 PM

## 2020-08-14 NOTE — ED Notes (Signed)
Ppt cleaned at this time entire bed and linen and gown change also placed on male  pure wick at this time Jeff [name]

## 2020-08-14 NOTE — Progress Notes (Signed)
ANTICOAGULATION CONSULT NOTE - Initial Consult  Pharmacy Consult for heparin Indication: chest pain/ACS/possible PE  No Known Allergies  Patient Measurements:   Heparin Dosing Weight: 69.2 kg  Vital Signs: Temp: 97.3 F (36.3 C) (04/21 2248) Temp Source: Oral (04/21 2248) BP: 117/88 (04/21 2200) Pulse Rate: 93 (04/21 2200)  Labs: Recent Labs    08/14/20 1208 08/14/20 1425 08/14/20 2210  HGB 11.6*  --   --   HCT 35.7*  --   --   PLT 205  --   --   APTT 50*  --   --   LABPROT 19.1*  --   --   INR 1.6*  --   --   HEPARINUNFRC  --   --  0.64  CREATININE 2.68*  --   --   TROPONINIHS 1,279* 199*  --     Estimated Creatinine Clearance: 22.2 mL/min (A) (by C-G formula based on SCr of 2.68 mg/dL (H)).   Medical History: Past Medical History:  Diagnosis Date  . Acute renal failure ([place]) 01/26/2014  . Alcohol abuse   . Anemia of chronic disease 01/26/2014  . Bilateral hydronephrosis 01/26/2014  . History of cocaine abuse ([place]) 01/26/2014  . Homelessness 01/31/2014  . 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 Sepsis ([place])   . UTI (lower urinary tract infection)     Medications:  Medications Prior to Admission  Medication Sig Dispense Refill Last Dose  . abiraterone acetate (ZYTIGA) 250 MG tablet TAKE 4 TABLETS (1,000 MG TOTAL) BY MOUTH DAILY. TAKE ON AN EMPTY STOMACH 1 HOUR BEFORE OR 2 HOURS AFTER A MEAL (Patient taking differently: Take by mouth daily. Take on an empty stomach 1 hour before or 2 hours after a meal.) 120 tablet 0 08/14/2020 at Unknown time  . aspirin EC 81 MG EC tablet Take 1 tablet (81 mg total) by mouth daily. Swallow whole. 30 tablet 11 08/14/2020 at 0800  . atorvastatin (LIPITOR) 40 MG tablet Take 1 tablet (40 mg total) by mouth daily.   08/13/2020 at Unknown time  . famotidine (PEPCID) 10 MG tablet Take 1 tablet (10 mg total) by mouth daily as needed for heartburn or indigestion.   08/14/2020 at Unknown time  . Menthol-Methyl  Salicylate (MUSCLE RUB) 10-15 % CREA Apply 1 application topically 2 (two) times daily as needed for muscle pain. To back and hips  0   . midodrine (PROAMATINE) 10 MG tablet Take 1 tablet (10 mg total) by mouth 3 (three) times daily with meals.   08/14/2020 at Unknown time  . simethicone (MYLICON) 80 MG chewable tablet Chew 1 tablet (80 mg total) by mouth 4 (four) times daily as needed for flatulence. 30 tablet 0 08/14/2020 at Unknown time  . predniSONE (DELTASONE) 5 MG tablet Take 1 tablet (5 mg total) by mouth daily with breakfast. (Patient not taking: Reported on 08/14/2020) 30 tablet 6 Not Taking at Unknown time  . predniSONE (DELTASONE) 5 MG tablet TAKE 1 TABLET BY MOUTH DAILY WITH BREAKFAST (Patient not taking: Reported on 08/14/2020) 30 tablet 6 Not Taking at Unknown time    Assessment: Pharmacy consulted to dose heparin in patient high suspicion for PE due to hypoxia, tachycardia, tachypnea without a fever in the setting of cancer treatment.  Less likely ACS as patient is without pain.  Patient is not on anticoagulation prior to admission.   Troponin 1279 CBC WNL  4/21 PM update:  Heparin level therapeutic   Goal of Therapy:  Heparin  level 0.3-0.7 units/ml Monitor platelets by anticoagulation protocol: Yes   Plan:  Cont heparin at 1200 units/hr Heparin level with AM labs  Narda Bonds, PharmD, [place] Pharmacist Phone: [phone]

## 2020-08-14 NOTE — Progress Notes (Signed)
Pharmacy Antibiotic Note  Jeff [name] is a 75 y.o. male admitted on 08/14/2020 with sepsis.  Pharmacy has been consulted for Vancomycin and Cefepime dosing.  Plan: Vancomycin 1500 mg IV x 1 dose. Vancomycin 1000 mg IV every 48 hours. Cefepime 2000 mg IV every 24 hours. Monitor labs, c/s, and vanco level as indicated.    Temp (24hrs), Avg:98.2 F (36.8 C), Min:97.5 F (36.4 C), Max:98.9 F (37.2 C)  Recent Labs  Lab 08/14/20 1208 08/14/20 1326 08/14/20 1535  WBC 9.3  --   --   CREATININE 2.68*  --   --   LATICACIDVEN  --  7.9* 7.9*    Estimated Creatinine Clearance: 22.2 mL/min (A) (by C-G formula based on SCr of 2.68 mg/dL (H)).    No Known Allergies  Antimicrobials this admission: Vanco 4/21 >>  Cefepime 4/21 >>  Flagyl 4/21 >>   Microbiology results: 4/21 BCx: pending  Thank you for allowing pharmacy to be a part of this patient's care.  Ramond Craver 08/14/2020 5:44 PM

## 2020-08-14 NOTE — ED Triage Notes (Signed)
Shortness of breath onset last night 

## 2020-08-14 NOTE — Consult Note (Addendum)
NAME:  Jeff [name], MRN:  [id number], DOB:  February 09, 1946, LOS: 0 ADMISSION DATE:  08/14/2020, CONSULTATION DATE:  08/14/20 REFERRING MD:  Dr Eulis Foster, CHIEF COMPLAINT: sob/ cardiogenic shock ? PE also   History of Present Illness:  32 yobm smoker  currently being treated for prostate cancer with mets to the bone with oral chemotherapy.  He last had an infusion on 4-14. Admit to ER am 4/21 with onset sob w/in last 12 hours prior to arrival s cp or cough.  He denies sick contacts.  He does not wear oxygen at home.  No history of lung problems such as COPD or asthma.  He is not on any blood thinners, no previous history of PE.  Additional history of alcohol and cocaine use, chf last echo 35%.   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   Significant Hospital Events: Including procedures, antibiotic start and stop dates in addition to other pertinent events   . ech 4/21  EF 88%, G III diastolic dysfunction / mod LA RA enlargement, PAS est 63 and RA 15  . Q scan 4/21 >>> low probability  . Venous dopplers 4/21 >>> . BC x 2 4/21 >>>   Scheduled Meds: . aspirin EC  81 mg Oral Daily   Continuous Infusions: . heparin 1,200 Units/hr (08/14/20 1640)   PRN Meds:.   Interim History / Subjective:  Says breathing better since arrival and placed on IV hep/ no cp / not needing prssos   Objective   Blood pressure (!) 121/96, pulse (!) 29, temperature 98.9 F (37.2 C), temperature source Rectal, resp. rate (!) 29, SpO2 90 %.        Intake/Output Summary (Last 24 hours) at 08/14/2020 1701 Last data filed at 08/14/2020 1640 Gross per 24 hour  Intake 70.04 ml  Output --  Net 70.04 ml   There were  no vitals filed for this visit.  Examination: Tmax 98.9 sats low 90's on NRM  General: chronically ill thin elderly bm  Lungs: distant insp crackles bilaterally, no wheeze Cardiovascular: RRR S3 gallop/ 2/6 sem Abdomen: soft /benign Extremities: slt cool, reduced pulses trace pitting  Neuro: intact          Assessment & Plan:  1)  Circulatory shock with lactic acidosis and marked elevation of BNP new finding of EF 10% and elevated PAS  >>> cards w/u in progress, no evidence pe by v/q but high risk so rec venous dopplers and high dose sq hep or lovenox empirically   2) AKI superimposed on CRI  Lab Results  Component Value Date   CREATININE 2.68 (H) 08/14/2020   CREATININE 1.66 (H) 08/07/2020   CREATININE 1.54 (H) 07/07/2020   >> monitor uop  3) widely met prostate ca  >>> prognosis in probably very poor and would weigh that out prior to escalation of cardiac rx/ intubation or CRT    4) Acute respiratory failure / hypoxemia - likely chf/ PE less likely  >>> diuresis/ dob per cards    Labs   CBC: Recent Labs  Lab 08/14/20 1208  WBC 9.3  NEUTROABS 6.9  HGB 11.6*  HCT 35.7*  MCV 97.0  PLT 416    Basic Metabolic Panel: Recent Labs  Lab 08/14/20 1208  NA 136  K  3.6  CL 104  CO2 12*  GLUCOSE 106*  BUN 53*  CREATININE 2.68*  CALCIUM 8.0*   GFR: Estimated Creatinine Clearance: 22.2 mL/min (A) (by C-G formula based on SCr of 2.68 mg/dL (H)). Recent Labs  Lab 08/14/20 1208 08/14/20 1326 08/14/20 1535  WBC 9.3  --   --   LATICACIDVEN  --  7.9* 7.9*    Liver Function Tests: Recent Labs  Lab 08/14/20 1208  AST 106*  ALT 66*  ALKPHOS 182*  BILITOT 2.5*  PROT 7.8  ALBUMIN 3.6   No results for input(s): LIPASE, AMYLASE in the last 168 hours. No results for input(s): AMMONIA in the last 168 hours.  ABG    Component Value Date/Time   PHART 7.354 08/14/2020 1525   PCO2ART 28.0 (L) 08/14/2020 1525   PO2ART <31.0 (LL) 08/14/2020 1525   HCO3 16.4  (L) 08/14/2020 1525   TCO2 21 (L) 10/25/2019 1426   ACIDBASEDEF 9.2 (H) 08/14/2020 1525   O2SAT 32.5 08/14/2020 1525     Coagulation Profile: Recent Labs  Lab 08/14/20 1208  INR 1.6*    Cardiac Enzymes: No results for input(s): CKTOTAL, CKMB, CKMBINDEX, TROPONINI in the last 168 hours.  HbA1C: Hemoglobin A1C  Date/Time Value Ref Range Status  02/06/2014 10:26 AM 5.9  Final   Hgb A1c MFr Bld  Date/Time Value Ref Range Status  10/25/2019 02:41 PM 6.3 (H) 4.8 - 5.6 % Final    Comment:    (NOTE) Pre diabetes:          5.7%-6.4%  Diabetes:              >6.4%  Glycemic control for   <7.0% adults with diabetes     CBG: No results for input(s): GLUCAP in the last 168 hours.     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   Surgical History:   Past Surgical History:  Procedure Laterality Date  . ORCHIECTOMY Bilateral 02/05/2014   Procedure: BILATERAL ORCHIECTOMY;  Surgeon: Festus Aloe, MD;  Location: [facility];  Service: Urology;  Laterality: Bilateral;  . PERCUTANEOUS NEPHROSTOMY Bilateral    IR Dr. Junious Silk changed on 04/22/2014  . PORT-A-CATH REMOVAL Right 08/08/2017   Procedure: REMOVAL PORT-A-CATH RIGHT CHEST (PROCEDURE #2);  Surgeon: Aviva Signs, MD;  Location: [facility];  Service: General;  Laterality: Right;  . PORTACATH PLACEMENT Right 03/12/14  . PORTACATH PLACEMENT Left 08/08/2017   Procedure: INSERTION POWER PORT WITH ATTACHED CATHETER LEFT SUBCLAVIAN (PROCEDURE #1);  Surgeon: Aviva Signs, MD;  Location: [facility];  Service: General;  Laterality: Left;     Social History:   reports that he has been smoking cigarettes. He has a  13.75 pack-year smoking history. He has never used smokeless tobacco. He reports previous alcohol use of about 1.0 standard drink of alcohol per week. He reports current drug use. Drug: Cocaine.   Family History:  His family history includes Cancer (age of onset: 65) in his brother.   Allergies No Known Allergies   Home Medications  Prior to Admission medications   Medication Sig Start Date End Date Taking? Authorizing Provider  abiraterone acetate (ZYTIGA) 250 MG tablet TAKE 4 TABLETS (1,000 MG TOTAL) BY MOUTH DAILY. TAKE ON AN EMPTY STOMACH 1 HOUR  BEFORE OR 2 HOURS AFTER A MEAL Patient taking differently: Take by mouth daily. Take on an empty stomach 1 hour before or 2 hours after a meal. 07/07/20 07/07/21 Yes Derek Jack, MD  aspirin EC 81 MG EC tablet Take 1 tablet (81 mg total) by mouth daily. Swallow whole. 11/01/19  Yes Amin, Ankit Chirag, MD  atorvastatin (LIPITOR) 40 MG tablet Take 1 tablet (40 mg total) by mouth daily. 11/01/19  Yes Amin, Jeanella Flattery, MD  famotidine (PEPCID) 10 MG tablet Take 1 tablet (10 mg total) by mouth daily as needed for heartburn or indigestion. 11/19/19  Yes Love, Ivan Anchors, PA-C  Menthol-Methyl Salicylate (MUSCLE RUB) 10-15 % CREA Apply 1 application topically 2 (two) times daily as needed for muscle pain. To back and hips 11/19/19  Yes Love, Ivan Anchors, PA-C  midodrine (PROAMATINE) 10 MG tablet Take 1 tablet (10 mg total) by mouth 3 (three) times daily with meals. 11/19/19  Yes Love, Ivan Anchors, PA-C  simethicone (MYLICON) 80 MG chewable tablet Chew 1 tablet (80 mg total) by mouth 4 (four) times daily as needed for flatulence. 11/19/19  Yes Love, Ivan Anchors, PA-C  predniSONE (DELTASONE) 5 MG tablet Take 1 tablet (5 mg total) by mouth daily with breakfast. Patient not taking: Reported on 08/14/2020 03/12/20   Derek Jack, MD  predniSONE (DELTASONE) 5 MG tablet TAKE 1 TABLET BY MOUTH DAILY WITH BREAKFAST Patient not taking: Reported on 08/14/2020 01/21/20  01/20/21  Derek Jack, MD     The patient is critically ill with multiple organ systems failure and requires high complexity decision making for assessment and support, frequent evaluation and titration of therapies, application of advanced monitoring technologies and extensive interpretation of multiple databases. Critical Care Time devoted to patient care services described in this note is 45 minutes.   Christinia Gully, MD Pulmonary and [place] [phone]   After 7:00 pm call Elink  [phone]

## 2020-08-14 NOTE — ED Provider Notes (Addendum)
[facility] EMERGENCY DEPARTMENT Provider Note   CSN: [id number] Arrival date & time: 08/14/20  1142     History Chief Complaint  Patient presents with  . Shortness of Breath    Jeff [name] is a 75 y.o. male presenting for evaluation of shortness of breath.    Patient states last night he developed shortness of breath.  He states it was gradual onset.  He has remained persistently short of breath throughout the night and this morning.  He denies associated fever, cough, chest pain, nausea, vomiting, abdominal pain, urinary symptoms, normal bowel movements.  He denies leg pain or swelling.  He is currently being treated for prostate cancer with mets to the bone with oral chemotherapy.  He last had an infusion on 4-14.  He denies sick contacts.  He does not wear oxygen at home.  No history of lung problems such as COPD or asthma.  He is not on any blood thinners, no previous history of PE.  Additional history obtained from chart review.  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 Worthy Keeler.  Additional history of alcohol and cocaine use, chf last echo 35%.   HPI     Past Medical History:  Diagnosis Date  . Acute renal failure ([place]) 01/26/2014  . Alcohol abuse   . Anemia of chronic disease 01/26/2014  . Bilateral hydronephrosis 01/26/2014  . History of cocaine abuse ([place]) 01/26/2014  . Homelessness 01/31/2014  . Prostate cancer metastatic to multiple sites ([place]) 01/30/2014  . Sepsis ([place])   . UTI (lower urinary tract infection)     Patient Active Problem List   Diagnosis Date Noted  . Goals of care, counseling/discussion 01/21/2020  . Chronic combined systolic and diastolic congestive heart failure ([place])   . 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 Hypotension   . Acute ischemic stroke ([place]) 10/26/2019  . Cocaine abuse ([place]) 10/26/2019  . CKD (chronic kidney disease) stage 3, GFR 30-59 ml/min (H[facility]) 10/26/2019  .  Acute metabolic encephalopathy 82/50/5397  . Prostate cancer metastatic to bone ([place]) 10/26/2019  . CVA (cerebral vascular accident) ([place]) 10/25/2019  . Port or reservoir infection   . B12 deficiency 04/01/2016  . Malnutrition of moderate degree ([place]) 05/24/2014  . Bacteremia 05/24/2014  . Pyrexia   . Sepsis ([place]) 05/23/2014  . UTI (lower urinary tract infection) 05/23/2014  . Health care maintenance 02/06/2014  . Homelessness 01/31/2014  . Malignant neoplasm of prostate ([place]) 01/30/2014  . Bone lesion 01/26/2014  . Acute renal failure ([place]) 01/26/2014  . Bilateral hydronephrosis 01/26/2014  . Anemia of chronic disease 01/26/2014  . History of cocaine abuse ([place]) 01/26/2014    Past Surgical History:  Procedure Laterality Date  . ORCHIECTOMY Bilateral 02/05/2014   Procedure: BILATERAL ORCHIECTOMY;  Surgeon: Festus Aloe, MD;  Location: [facility] ORS;  Service: Urology;  Laterality: Bilateral;  . PERCUTANEOUS NEPHROSTOMY Bilateral    IR Dr. Junious Silk changed on 04/22/2014  . PORT-A-CATH REMOVAL Right 08/08/2017   Procedure: REMOVAL PORT-A-CATH RIGHT CHEST (PROCEDURE #2);  Surgeon: Aviva Signs, MD;  Location: [facility] ORS;  Service: General;  Laterality: Right;  . PORTACATH PLACEMENT Right 03/12/14  . PORTACATH PLACEMENT Left 08/08/2017   Procedure: INSERTION POWER PORT WITH ATTACHED CATHETER LEFT SUBCLAVIAN (PROCEDURE #1);  Surgeon: Aviva Signs, MD;  Location: [facility] ORS;  Service: General;  Laterality: Left;       Family History  Problem Relation Age of Onset  . Cancer Brother  60    Social History   Tobacco Use  . Smoking status: Current Every Day Smoker    Packs/day: 0.25    Years: 55.00    Pack years: 13.75    Types: Cigarettes  . Smokeless tobacco: Never Used  Vaping Use  . Vaping Use: Never used  Substance Use Topics  . Alcohol use: Not Currently    Alcohol/week: 1.0 standard drink    Types: 1 Cans of beer per week  . Drug use: Yes    Types: Cocaine    Comment: last  used Cocaine 01/25/14    Home Medications Prior to Admission medications   Medication Sig Start Date End Date Taking? Authorizing Provider  abiraterone acetate (ZYTIGA) 250 MG tablet TAKE 4 TABLETS (1,000 MG TOTAL) BY MOUTH DAILY. TAKE ON AN EMPTY STOMACH 1 HOUR BEFORE OR 2 HOURS AFTER A MEAL Patient taking differently: Take by mouth daily. Take on an empty stomach 1 hour before or 2 hours after a meal. 07/07/20 07/07/21 Yes Derek Jack, MD  aspirin EC 81 MG EC tablet Take 1 tablet (81 mg total) by mouth daily. Swallow whole. 11/01/19  Yes Amin, Ankit Chirag, MD  atorvastatin (LIPITOR) 40 MG tablet Take 1 tablet (40 mg total) by mouth daily. 11/01/19  Yes Amin, Jeanella Flattery, MD  famotidine (PEPCID) 10 MG tablet Take 1 tablet (10 mg total) by mouth daily as needed for heartburn or indigestion. 11/19/19  Yes Love, Ivan Anchors, PA-C  Menthol-Methyl Salicylate (MUSCLE RUB) 10-15 % CREA Apply 1 application topically 2 (two) times daily as needed for muscle pain. To back and hips 11/19/19  Yes Love, Ivan Anchors, PA-C  midodrine (PROAMATINE) 10 MG tablet Take 1 tablet (10 mg total) by mouth 3 (three) times daily with meals. 11/19/19  Yes Love, Ivan Anchors, PA-C  simethicone (MYLICON) 80 MG chewable tablet Chew 1 tablet (80 mg total) by mouth 4 (four) times daily as needed for flatulence. 11/19/19  Yes Love, Ivan Anchors, PA-C  predniSONE (DELTASONE) 5 MG tablet Take 1 tablet (5 mg total) by mouth daily with breakfast. Patient not taking: Reported on 08/14/2020 03/12/20   Derek Jack, MD  predniSONE (DELTASONE) 5 MG tablet TAKE 1 TABLET BY MOUTH DAILY WITH BREAKFAST Patient not taking: Reported on 08/14/2020 01/21/20 01/20/21  Derek Jack, MD    Allergies    Patient has no known allergies.  Review of Systems   Review of Systems  Respiratory: Positive for shortness of breath.   Allergic/Immunologic: Positive for immunocompromised state.  All other systems reviewed and are negative.   Physical  Exam Updated Vital Signs BP (!) 111/94   Pulse (!) 47   Temp 98.9 F (37.2 C) (Rectal)   Resp (!) 27   SpO2 100%   Physical Exam Vitals and nursing note reviewed.  Constitutional:      General: He is not in acute distress.    Appearance: He is well-developed. He is ill-appearing.     Comments: Appears ill  HENT:     Head: Normocephalic and atraumatic.  Eyes:     Conjunctiva/sclera: Conjunctivae normal.     Pupils: Pupils are equal, round, and reactive to light.  Cardiovascular:     Rate and Rhythm: Regular rhythm. Tachycardia present.     Pulses: Normal pulses.     Comments: Tachycardia around 115 Pulmonary:     Effort: Tachypnea and respiratory distress present.     Breath sounds: Normal breath sounds. No decreased breath sounds, wheezing or rhonchi.  Comments: On nonrebreather sats of 94%.  No accessory muscle use however patient is mildly tachypneic.  Clear lung sounds in all fields.  On room air, sats are in the mid 70s. Abdominal:     General: There is no distension.     Palpations: Abdomen is soft. There is no mass.     Tenderness: There is no abdominal tenderness. There is no guarding or rebound.  Musculoskeletal:        General: Normal range of motion.     Cervical back: Normal range of motion and neck supple.     Right lower leg: No edema.     Left lower leg: No edema.     Comments: No peripheral edema or leg pain  Skin:    General: Skin is warm and dry.     Capillary Refill: Capillary refill takes less than 2 seconds.  Neurological:     Mental Status: He is alert and oriented to person, place, and time.     ED Results / Procedures / Treatments   Labs (all labs ordered are listed, but only abnormal results are displayed) Labs Reviewed  CBC WITH DIFFERENTIAL/PLATELET - Abnormal; Notable for the following components:      Result Value   RBC 3.68 (*)    Hemoglobin 11.6 (*)    HCT 35.7 (*)    RDW 18.9 (*)    nRBC 3.1 (*)    All other components within  normal limits  COMPREHENSIVE METABOLIC PANEL - Abnormal; Notable for the following components:   CO2 12 (*)    Glucose, Bld 106 (*)    BUN 53 (*)    Creatinine, Ser 2.68 (*)    Calcium 8.0 (*)    AST 106 (*)    ALT 66 (*)    Alkaline Phosphatase 182 (*)    Total Bilirubin 2.5 (*)    GFR, Estimated 24 (*)    Anion gap 20 (*)    All other components within normal limits  BRAIN NATRIURETIC PEPTIDE - Abnormal; Notable for the following components:   B Natriuretic Peptide >4,500.0 (*)    All other components within normal limits  LACTIC ACID, PLASMA - Abnormal; Notable for the following components:   Lactic Acid, Venous 7.9 (*)    All other components within normal limits  APTT - Abnormal; Notable for the following components:   aPTT 50 (*)    All other components within normal limits  PROTIME-INR - Abnormal; Notable for the following components:   Prothrombin Time 19.1 (*)    INR 1.6 (*)    All other components within normal limits  TROPONIN I (HIGH SENSITIVITY) - Abnormal; Notable for the following components:   Troponin I (High Sensitivity) 1,279 (*)    All other components within normal limits  RESP PANEL BY RT-PCR (FLU A&B, COVID) ARPGX2  CULTURE, BLOOD (ROUTINE X 2)  CULTURE, BLOOD (ROUTINE X 2)  LACTIC ACID, PLASMA  HEPARIN LEVEL (UNFRACTIONATED)  TROPONIN I (HIGH SENSITIVITY)    EKG EKG Interpretation  Date/Time:  Thursday August 14 2020 11:53:59 EDT Ventricular Rate:  135 PR Interval:  101 QRS Duration: 97 QT Interval:  303 QTC Calculation: 458 R Axis:   143 Text Interpretation: Sinus tachycardia Ventricular premature complex Inferior infarct, old Anterolateral infarct, age indeterminate Since last tracing Premature ventricular complexes is new Otherwise no significant change Confirmed by Daleen Bo [phone]) on 08/14/2020 12:08:02 PM   Radiology DG Chest 1 View  Result Date: 08/14/2020 CLINICAL DATA:  Short of breath EXAM: CHEST  1 VIEW COMPARISON:  CT chest  08/03/2019 FINDINGS: Left chest wall port a catheter noted with tip in the distal SVC. Mild cardiac enlargement. Small bilateral pleural effusions and pulmonary vascular congestion identified. Chronic interstitial coarsening of COPD/emphysema noted. Diffuse sclerotic bone metastases are noted. IMPRESSION: 1. Cardiac enlargement and CHF. 2. Diffuse sclerotic bone metastases. Electronically Signed   By: Kerby Moors M.D.   On: 08/14/2020 13:05    Procedures .Critical Care Performed by: Franchot Heidelberg, PA-C Authorized by: Franchot Heidelberg, PA-C   Critical care provider statement:    Critical care time (minutes):  65   Critical care time was exclusive of:  Separately billable procedures and treating other patients and teaching time   Critical care was necessary to treat or prevent imminent or life-threatening deterioration of the following conditions:  Cardiac failure and respiratory failure   Critical care was time spent personally by me on the following activities:  Blood draw for specimens, development of treatment plan with patient or surrogate, evaluation of patient's response to treatment, examination of patient, obtaining history from patient or surrogate, ordering and performing treatments and interventions, ordering and review of laboratory studies, ordering and review of radiographic studies, pulse oximetry, re-evaluation of patient's condition and review of old charts   I assumed direction of critical care for this patient from another provider in my specialty: no     Care discussed with: admitting provider   Comments:     Patient presenting critically ill in respiratory distress.  Work-up concerning for acute on chronic heart failure, high suspicion for PE, and elevated troponin.  Patient admitted to the ICU with cardiac consultation.     Medications Ordered in ED Medications  heparin ADULT infusion 100 units/mL (25000 units/230mL) (1,200 Units/hr Intravenous Infusion Verify  08/14/20 1427)  furosemide (LASIX) injection 20 mg (has no administration in time range)  heparin bolus via infusion 4,000 Units (4,000 Units Intravenous Bolus from Bag 08/14/20 1405)    ED Course  I have reviewed the triage vital signs and the nursing notes.  Pertinent labs & imaging results that were available during my care of the patient were reviewed by me and considered in my medical decision making (see chart for details).    MDM Rules/Calculators/A&P                          Patient presented for evaluation of shortness of breath.  On exam, patient found to be hypoxic on room air.  PX nonrebreather, sats and shortness of breath improved.  Patient remains tachycardic and tachypneic.  High suspicion for PE due to hypoxia, tachycardia, tachypnea without a fever in the setting of cancer treatment.  Also consider infectious cause for CHF.  Less likely ACS as patient is without pain. Will obtain labs, chest x-ray, CTA, EKG, COVID test.  On reassessment, patient is resting comfortably on a nonrebreather.  He remains tachycardic and tachypneic, but no increased work of breathing.  Chest x-ray viewed interpreted by me, shows cardiomegaly with small bilateral pleural effusions.  Patient does not clinically look fluid overloaded, however concern for acute on chronic heart failure. Labs show AKI with creatinine of 2.68, baseline 1.6.  Will not be able to obtain CTA due to AKI, VQ scan ordered.  No leukocytosis and COVID test is negative. Without fever, doubt infection.  Troponin found to be elevated at 1279.  Will start heparin as this will treat for both  ACS and PE and consult cardiology.  Of note, patient's lactic elevated at 7.9.  This is likely multifactorial including due to cancer and demand.   Discussed findings and plan with patient, who is agreeable.  Discussed with Dr. Harl Bowie from cardiology who recommends admission to the ICU.  Does not feel patient needs to be transferred to [facility] at this  time.  He is not a Candidate at this time due to his AKI.  He confirms importance of heparin, does not recommend anything else including nitro.  He will evaluate the patient and place order for an echo.  Discussed with Dr. Melvyn Novas from critical care who will evaluate the patient.  He is requesting high-dose heparin bolus and infusion.  Requesting stat echo.  I discussed pt with pharmacy and echo lab to facilitate treatment.   BNP returned at greater than 4500.  Will start Lasix, patient is not on diuretics at baseline.  Discussed with Dr. Denton Brick from [facility] hospitalist service, patient to be admitted.  Final Clinical Impression(s) / ED Diagnoses Final diagnoses:  Hypoxia  Respiratory distress  Elevated troponin  Acute on chronic congestive heart failure, unspecified heart failure type ([place])  AKI (acute kidney injury) [facility])    Rx / DC Orders ED Discharge Orders    None       Franchot Heidelberg, PA-C 08/14/20 1437    Franchot Heidelberg, PA-C 08/14/20 1752    Daleen Bo, MD 08/15/20 [phone]

## 2020-08-14 NOTE — Progress Notes (Signed)
*  PRELIMINARY RESULTS* Echocardiogram 2D Echocardiogram has been performed.  Leavy Cella 08/14/2020, 3:14 PM

## 2020-08-14 NOTE — ED Provider Notes (Signed)
  Face-to-face evaluation   History: He presents for evaluation of shortness of breath which started last night.  On arrival he is hypoxic.  He is currently receiving chemotherapy for prostate cancer.  Last infusion was 8 days ago.  He denies pain, focal weakness or paresthesia.  Physical exam: Elderly male, alert and cooperative.  He is tachypneic.  No respiratory distress.  No JVD.  No peripheral edema.  Medical screening examination/treatment/procedure(s) were conducted as a shared visit with non-physician practitioner(s) and myself.  I personally evaluated the patient during the encounter    Daleen Bo, MD 08/15/20 [phone]

## 2020-08-14 NOTE — Consult Note (Addendum)
Cardiology Consultation:   Patient ID: Jeff [name] MRN: [id number]; DOB: 03-Jun-1945  Admit date: 08/14/2020 Date of Consult: 08/14/2020  PCP:  Patient, No Pcp Per (Inactive)   [facility]  Cardiologist:  Carlyle Dolly, MD  Advanced Practice Provider:  No care team member to display Electrophysiologist:  None  :[id number]}    Patient Profile:   Jeff [name] is a 75 y.o. male with a hx of chronic systolic HF, metastatic prostate cancer, cocaine abuse  who is being seen today for the evaluation of SOB at the request of Dr Eulis Foster.  History of Present Illness:   [name] 75 yo male history of chronic systolic HF 04/6071 echo LVEF 35%, prior CVA, metatstatic prostate cancer with bone mets followed in cancer center, cocaine abuse, chronic hyotension on home midodrine. Seen in clinic 02/2020, he did not have repeat echo and did not followup .  From oncology  Notes history of bilateral orchiectomy, docetaxel previously and more recently cabazitaxel.   Reports suddent onset of SOB while watching television. No chest pain. Has had some productive cough last few days but no fevers or chills. Denies any LE edema.     WBC 9.3 Hgb 11.6 Plt 205 K 3.6 BUN 53 Cr 2.68 AST 106 ALT 66 Alk phos 182 Tbili 2.5 BNP >4500 INR 1.6 Lactic acid 7.9  COVID neg ABG 7.35/28/<31/16 (probably venous) Trop 1279--> CXR small bilaterla effusions  Echo LVEF 10-15%, grade III dd, moderate to severe RV failure, mod BAE, mod MR, mod to severe pulml HTN EKG sinus tach, PACs  Past Medical History:  Diagnosis Date  . Acute renal failure ([place]) 01/26/2014  . Alcohol abuse   . Anemia of chronic disease 01/26/2014  . Bilateral hydronephrosis 01/26/2014  . History of cocaine abuse ([place]) 01/26/2014  . Homelessness 01/31/2014  . Prostate cancer metastatic to multiple sites ([place]) 01/30/2014  . Sepsis ([place])   . UTI (lower urinary tract infection)     Past Surgical History:  Procedure  Laterality Date  . ORCHIECTOMY Bilateral 02/05/2014   Procedure: BILATERAL ORCHIECTOMY;  Surgeon: Festus Aloe, MD;  Location: [facility];  Service: Urology;  Laterality: Bilateral;  . PERCUTANEOUS NEPHROSTOMY Bilateral    IR Dr. Junious Silk changed on 04/22/2014  . PORT-A-CATH REMOVAL Right 08/08/2017   Procedure: REMOVAL PORT-A-CATH RIGHT CHEST (PROCEDURE #2);  Surgeon: Aviva Signs, MD;  Location: [facility];  Service: General;  Laterality: Right;  . PORTACATH PLACEMENT Right 03/12/14  . PORTACATH PLACEMENT Left 08/08/2017   Procedure: INSERTION POWER PORT WITH ATTACHED CATHETER LEFT SUBCLAVIAN (PROCEDURE #1);  Surgeon: Aviva Signs, MD;  Location: [facility];  Service: General;  Laterality: Left;     Inpatient Medications: Scheduled Meds:  Continuous Infusions: . heparin 1,200 Units/hr (08/14/20 1549)   PRN Meds:   Allergies:   No Known Allergies  Social History:   Social History   Socioeconomic History  . Marital status: Single    Spouse name: Not on file  . Number of children: 1  . Years of education: 9th  . Highest education level: Not on file  Occupational History  . Occupation: disabilty    Employer: [employer]  Tobacco Use  . Smoking status: Current Every Day Smoker    Packs/day: 0.25    Years: 55.00    Pack years: 13.75    Types: Cigarettes  . Smokeless tobacco: Never Used  Vaping Use  . Vaping Use: Never used  Substance and Sexual Activity  . Alcohol use: Not Currently  Alcohol/week: 1.0 standard drink    Types: 1 Cans of beer per week  . Drug use: Yes    Types: Cocaine    Comment: last used Cocaine 01/25/14  . Sexual activity: Yes  Other Topics Concern  . Not on file  Social History Narrative   Lives at [employer] 9th grade   1 son, lives in [place], [place]   Can read and write in native language            Social Determinants of Health   Financial Resource Strain: Low Risk   . Difficulty of Paying Living Expenses: Not hard  at all  Food Insecurity: No Food Insecurity  . Worried About Charity fundraiser in the Last Year: Never true  . Ran Out of Food in the Last Year: Never true  Transportation Needs: No Transportation Needs  . Lack of Transportation (Medical): No  . Lack of Transportation (Non-Medical): No  Physical Activity: Inactive  . Days of Exercise per Week: 0 days  . Minutes of Exercise per Session: 0 min  Stress: No Stress Concern Present  . Feeling of Stress : Not at all  Social Connections: Socially Isolated  . Frequency of Communication with Friends and Family: More than three times a week  . Frequency of Social Gatherings with Friends and Family: Three times a week  . Attends Religious Services: Never  . Active Member of Clubs or Organizations: No  . Attends Archivist Meetings: Never  . Marital Status: Never married  Intimate Partner Violence: Not At Risk  . Fear of Current or Ex-Partner: No  . Emotionally Abused: No  . Physically Abused: No  . Sexually Abused: No    Family History:    Family History  Problem Relation Age of Onset  . Cancer Brother 60     ROS:  Please see the history of present illness.  All other ROS reviewed and negative.     Physical Exam/Data:   Vitals:   08/14/20 1500 08/14/20 1513 08/14/20 1515 08/14/20 1557  BP: 102/80     Pulse:   (!) 29   Resp: (!) 22 (!) 30 (!) 34 (!) 24  Temp:      TempSrc:      SpO2:  91% 90%     Intake/Output Summary (Last 24 hours) at 08/14/2020 1612 Last data filed at 08/14/2020 1549 Gross per 24 hour  Intake 60.24 ml  Output --  Net 60.24 ml   Last 3 Weights 08/07/2020 06/09/2020 04/14/2020  Weight (lbs) 152 lb 9.6 oz 164 lb 6.4 oz 157 lb 8 oz  Weight (kg) 69.219 kg 74.571 kg 71.442 kg     There is no height or weight on file to calculate BMI.  General:  Well nourished, well developed, in no acute distress HEENT: normal Lymph: no adenopathy Neck: elevated JVD Endocrine:  No thryomegaly Vascular: No  carotid bruits; FA pulses 2+ bilaterally without bruits  Cardiac:  Crackles bilatearly Lungs:  clear to auscultation bilaterally, no wheezing, rhonchi or rales  Abd: soft, nontender, no hepatomegaly  Ext: no edema Musculoskeletal:  No deformities, BUE and BLE strength normal and equal Skin: warm and dry  Neuro:  CNs 2-12 intact, no focal abnormalities noted Psych:  Normal affect     Laboratory Data:  High Sensitivity Troponin:   Recent Labs  Lab 08/14/20 1208  TROPONINIHS 1,279*     Chemistry Recent Labs  Lab 08/14/20 1208  NA 136  K 3.6  CL 104  CO2 12*  GLUCOSE 106*  BUN 53*  CREATININE 2.68*  CALCIUM 8.0*  GFRNONAA 24*  ANIONGAP 20*    Recent Labs  Lab 08/14/20 1208  PROT 7.8  ALBUMIN 3.6  AST 106*  ALT 66*  ALKPHOS 182*  BILITOT 2.5*   Hematology Recent Labs  Lab 08/14/20 1208  WBC 9.3  RBC 3.68*  HGB 11.6*  HCT 35.7*  MCV 97.0  MCH 31.5  MCHC 32.5  RDW 18.9*  PLT 205   BNP Recent Labs  Lab 08/14/20 1208  BNP >4,500.0*    DDimer No results for input(s): DDIMER in the last 168 hours.   Radiology/Studies:  DG Chest 1 View  Result Date: 08/14/2020 CLINICAL DATA:  Short of breath EXAM: CHEST  1 VIEW COMPARISON:  CT chest 08/03/2019 FINDINGS: Left chest wall port a catheter noted with tip in the distal SVC. Mild cardiac enlargement. Small bilateral pleural effusions and pulmonary vascular congestion identified. Chronic interstitial coarsening of COPD/emphysema noted. Diffuse sclerotic bone metastases are noted. IMPRESSION: 1. Cardiac enlargement and CHF. 2. Diffuse sclerotic bone metastases. Electronically Signed   By: Kerby Moors M.D.   On: 08/14/2020 13:05   ECHOCARDIOGRAM COMPLETE  Result Date: 08/14/2020    ECHOCARDIOGRAM REPORT   Patient Name:   Jeff [name] Date of Exam: 08/14/2020 Medical Rec #:  [id number]       Height:       66.0 in Accession #:    [id number]      Weight:       152.6 lb Date of Birth:  1945/06/14       BSA:           1.783 m Patient Age:    69 years        BP:           102/80 mmHg Patient Gender: M               HR:           107 bpm. Exam Location:  [facility] Procedure: 2D Echo Indications:    Dyspnea R06.00  History:        Patient has prior history of Echocardiogram examinations, most                 recent 10/26/2019. Stroke, Signs/Symptoms:Bacteremia; Risk                 Factors:Current Smoker. Cocaine Abuse, Sepsis, Acute Renal                 Failure.  Sonographer:    Leavy Cella RDCS (AE) Referring Phys: [id number] [place]  1. Left ventricular ejection fraction, by estimation, is 10-15%. The left ventricle has severely decreased function. The left ventricle demonstrates global hypokinesis. The left ventricular internal cavity size was mildly dilated. Left ventricular diastolic parameters are consistent with Grade III diastolic dysfunction (restrictive). Elevated left atrial pressure.  2. Right ventricular systolic function moderately to severely reduced. The right ventricular size is mildly enlarged.  3. Left atrial size was moderately dilated.  4. Right atrial size was moderately dilated.  5. The pericardial effusion is circumferential. Moderate pleural effusion in the left lateral region.  6. MR is eccentric and may be underestimated. . The mitral valve is abnormal. Moderate mitral valve regurgitation. No evidence of mitral stenosis.  7. The tricuspid valve is abnormal. Tricuspid valve regurgitation is moderate.  8. The aortic valve is tricuspid. There  is mild calcification of the aortic valve. There is mild thickening of the aortic valve. Aortic valve regurgitation is mild. No aortic stenosis is present.  9. 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 10. The inferior vena cava is dilated in size with <50% respiratory variability, suggesting right atrial pressure of 15 mmHg. FINDINGS  Left Ventricle: Left ventricular ejection fraction, by estimation, is 10-15%. The left ventricle has  severely decreased function. The left ventricle demonstrates global hypokinesis. The left ventricular internal cavity size was mildly dilated. There is no left ventricular hypertrophy. Left ventricular diastolic parameters are consistent with Grade III diastolic dysfunction (restrictive). Elevated left atrial pressure. Right Ventricle: The right ventricular size is mildly enlarged. Right vetricular wall thickness was not assessed. Right ventricular systolic function moderately to severely reduced. Left Atrium: Left atrial size was moderately dilated. Right Atrium: Right atrial size was moderately dilated. Pericardium: Trivial pericardial effusion is present. The pericardial effusion is circumferential. Mitral Valve: MR is eccentric and may be underestimated. The mitral valve is abnormal. There is mild thickening of the mitral valve leaflet(s). There is mild calcification of the mitral valve leaflet(s). Mild mitral annular calcification. Moderate mitral  valve regurgitation. No evidence of mitral valve stenosis. Tricuspid Valve: The tricuspid valve is abnormal. Tricuspid valve regurgitation is moderate . No evidence of tricuspid stenosis. Aortic Valve: The aortic valve is tricuspid. There is mild calcification of the aortic valve. There is mild thickening of the aortic valve. There is mild aortic valve annular calcification. Aortic valve regurgitation is mild. No aortic stenosis is present. Aortic valve mean gradient measures 3.0 mmHg. Aortic valve peak gradient measures 5.9 mmHg. Pulmonic Valve: The pulmonic valve was not well visualized. Pulmonic valve regurgitation is mild. No evidence of pulmonic stenosis. Aorta: The aortic root is normal in size and structure. 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 Venous: The inferior vena cava is dilated in size with less than 50% respiratory variability, suggesting right atrial pressure of 15 mmHg. IAS/Shunts: No atrial level shunt detected by  color flow Doppler. Additional Comments: There is a moderate pleural effusion in the left lateral region.  LEFT VENTRICLE PLAX 2D LVIDd:         5.91 cm  Diastology LVIDs:         5.61 cm  LV e' medial:    4.19 cm/s LV PW:         0.95 cm  LV E/e' medial:  29.4 LV IVS:        0.92 cm  LV e' lateral:   6.80 cm/s LVOT diam:     1.90 cm  LV E/e' lateral: 18.1 LVOT Area:     2.84 cm  RIGHT VENTRICLE RV S prime:     6.01 cm/s TAPSE (M-mode): 0.9 cm LEFT ATRIUM             Index       RIGHT ATRIUM           Index LA diam:        4.50 cm 2.52 cm/m  RA Area:     17.80 cm LA Vol (A2C):   77.9 ml 43.70 ml/m RA Volume:   56.30 ml  31.58 ml/m LA Vol (A4C):   62.7 ml 35.17 ml/m LA Biplane Vol: 70.3 ml 39.44 ml/m  AORTIC VALVE AV Vmax:      121.62 cm/s AV Vmean:     81.192 cm/s AV VTI:       0.149  m AV Peak Grad: 5.9 mmHg AV Mean Grad: 3.0 mmHg  AORTA Ao Root diam: 3.10 cm MITRAL VALVE                TRICUSPID VALVE MV Area (PHT): 5.79 cm     TR Peak grad:   46.5 mmHg MV Decel Time: 131 msec     TR Vmax:        341.00 cm/s MR Peak grad: 84.3 mmHg MR Mean grad: 57.0 mmHg     SHUNTS MR Vmax:      459.00 cm/s   Systemic Diam: 1.90 cm MR Vmean:     350.0 cm/s MV E velocity: 123.00 cm/s MV A velocity: 50.30 cm/s MV E/A ratio:  2.45 Carlyle Dolly MD Electronically signed by Carlyle Dolly MD Signature Date/Time: 08/14/2020/3:38:27 PM    Final      Assessment and Plan:   1. Acute on chronic systolic HF/Cardiogenic shock/Septic shock - prior LVEF 35%, he never followed up in outpatient cardiology clinic. Historically history of chronic hypotension on midodrine has not been on CHF meds  - echo here LVEF 10-15%, mod to severe RV failiure. BNP>4500. Evidence of elevated LFTs, AKI, lactic acidosis,  Extremities mildly cool to the touch. Procalcitonin markedly elevated suggesting (has also been hypothermic) likely combined cardiogenic and septic shock. SBPs low 100s but evidence of multiorgan dysfunction consistent  consistent with shock.  - COOX from port is pending   - not a candidate for urgent cath given renal failure, metastatic prostate cancer. Will need to get a feel for overall prognosis of his cancer going forward regarding potential cath later in admission.  - would look to stabilize patient [facility] at [facility], no indication for urgent trasnfer as not a cath candidate at the time being. Potential transfer tomorrow once further stablized.   Addendum: coox 23% very low, start dobutamine 18mg/kg/min DO NOT TITRATE unless MD order. Redose IV lasix after being on inotrope. If issues with bp's would start levophed.   2. Elevated troponin - in setting of hypoxia, HF, renal failure, sepsis.  - odd rapid decline from 1279 to 199 in 4 hours, continue to follow trend - EKG without acute ischemic changes. Echo with decreased LVEF with global hypokinesis - not a cath candidate urgently due to AKI, unclear candidacy overall given metastatic prostate cancer.  - started on anticoagulation. Start ASA 880m Can hold statin with elevated LFTs. With history of chronic hypotension and current drop in LVEF avoid beta blocker, with AKI no ACE/ARB/ARNI    3. Metabolic acidosis - elevated lactate in setting of cardiogenic/septic shock - ABG likely venous, did show pH 7.35 pCO2 28 and bicab 16 - f/u coox - consdierations for septic shock per primary team  4. AKI on CKD - in setting of septic/cardiogenic shock - optimize perfusion  5. Elevated LFTs - in setting of septic/cardiogenic shock - optimize perfusion   6. Substance abuse - previously cocaine +, UDS is pending  7. Metastatic prostate cancer - would need to touch base with his oncologist tomorrow to assess overall prognosis to help guide goals of care.        For questions or updates, please contact [email or web address] consult [email or web address] for contact info under    Signed, BrCarlyle DollyMD  08/14/2020 4:12 PM

## 2020-08-14 NOTE — ED Notes (Signed)
Pt sheets soiled cleaned pt and changed sheets

## 2020-08-14 NOTE — H&P (Addendum)
History and Physical    Jeff [name] ZOX:[id number] DOB: 16-Aug-1945 DOA: 08/14/2020  PCP: Patient, No Pcp Per (Inactive)   Patient coming from: Home  I have personally briefly reviewed patient's old medical records in [place]  Chief Complaint: Difficulty breathing  HPI: Jeff [name] is a 75 y.o. male with medical history significant for systolic and diastolic CHF, prostate cancer on chemotherapy. Patient presented to the ED with complaints of the started last night.  In the time of my evaluation patient awake and alert and able to answer questions appropriately, he is able to give me history, but he is short of breath and on nonrebreather limiting history. He tells me he was watching TV when symptoms started.  He denies leg swelling.  Denies chest pain.  ED Course: O2 sat 76% on room air, documented heart rate ranging from 29-109, blood pressure 102-132.  Temperature 97.5.  Troponin 1279 >> 199.  Lactic acidosis of 7.9 twice.  WBC 9.3.  Creatinine elevated 2.68.  BNP greater than 4500.  ABG showed pH of 7.3, PCO2 28, PO2 less than 31. VQ scan negative for PE.  Chest x-ray showed cardiac enlargement and CHF.  Bilat LE Dopplers negative for DVT.   Cardiologist- dr. Harl Bowie and critical care- Dr Melvyn Novas were consulted.  Patient currently not a candidate for urgent catheterization given his renal failure, metastatic prostate cancer.  Co-ox was very low at 23%, heparin drip, dobutamine drip and IV lasix started.  Echo showed EF of 10 - 15% with global hypokinesis. Critical care okay with patient staying here, agreed with therapeutic anticoagulation.   Review of Systems: As per HPI all other systems reviewed and negative.  Past Medical History:  Diagnosis Date  . Acute renal failure ([place]) 01/26/2014  . Alcohol abuse   . Anemia of chronic disease 01/26/2014  . Bilateral hydronephrosis 01/26/2014  . History of cocaine abuse ([place]) 01/26/2014  . Homelessness 01/31/2014  . Prostate cancer  metastatic to multiple sites ([place]) 01/30/2014  . Sepsis ([place])   . UTI (lower urinary tract infection)     Past Surgical History:  Procedure Laterality Date  . ORCHIECTOMY Bilateral 02/05/2014   Procedure: BILATERAL ORCHIECTOMY;  Surgeon: Festus Aloe, MD;  Location: [facility];  Service: Urology;  Laterality: Bilateral;  . PERCUTANEOUS NEPHROSTOMY Bilateral    IR Dr. Junious Silk changed on 04/22/2014  . PORT-A-CATH REMOVAL Right 08/08/2017   Procedure: REMOVAL PORT-A-CATH RIGHT CHEST (PROCEDURE #2);  Surgeon: Aviva Signs, MD;  Location: [facility];  Service: General;  Laterality: Right;  . PORTACATH PLACEMENT Right 03/12/14  . PORTACATH PLACEMENT Left 08/08/2017   Procedure: INSERTION POWER PORT WITH ATTACHED CATHETER LEFT SUBCLAVIAN (PROCEDURE #1);  Surgeon: Aviva Signs, MD;  Location: [facility];  Service: General;  Laterality: Left;     reports that he has been smoking cigarettes. He has a 13.75 pack-year smoking history. He has never used smokeless tobacco. He reports previous alcohol use of about 1.0 standard drink of alcohol per week. He reports current drug use. Drug: Cocaine.  No Known Allergies  Family History  Problem Relation Age of Onset  . Cancer Brother 60    Prior to Admission medications   Medication Sig Start Date End Date Taking? Authorizing Provider  abiraterone acetate (ZYTIGA) 250 MG tablet TAKE 4 TABLETS (1,000 MG TOTAL) BY MOUTH DAILY. TAKE ON AN EMPTY STOMACH 1 HOUR BEFORE OR 2 HOURS AFTER A MEAL Patient taking differently: Take by mouth daily. Take on an empty stomach 1 hour before  or 2 hours after a meal. 07/07/20 07/07/21 Yes Derek Jack, MD  aspirin EC 81 MG EC tablet Take 1 tablet (81 mg total) by mouth daily. Swallow whole. 11/01/19  Yes Amin, Ankit Chirag, MD  atorvastatin (LIPITOR) 40 MG tablet Take 1 tablet (40 mg total) by mouth daily. 11/01/19  Yes Amin, Jeanella Flattery, MD  famotidine (PEPCID) 10 MG tablet Take 1 tablet (10 mg total) by mouth daily as needed  for heartburn or indigestion. 11/19/19  Yes Love, Ivan Anchors, PA-C  Menthol-Methyl Salicylate (MUSCLE RUB) 10-15 % CREA Apply 1 application topically 2 (two) times daily as needed for muscle pain. To back and hips 11/19/19  Yes Love, Ivan Anchors, PA-C  midodrine (PROAMATINE) 10 MG tablet Take 1 tablet (10 mg total) by mouth 3 (three) times daily with meals. 11/19/19  Yes Love, Ivan Anchors, PA-C  simethicone (MYLICON) 80 MG chewable tablet Chew 1 tablet (80 mg total) by mouth 4 (four) times daily as needed for flatulence. 11/19/19  Yes Love, Ivan Anchors, PA-C  predniSONE (DELTASONE) 5 MG tablet Take 1 tablet (5 mg total) by mouth daily with breakfast. Patient not taking: Reported on 08/14/2020 03/12/20   Derek Jack, MD  predniSONE (DELTASONE) 5 MG tablet TAKE 1 TABLET BY MOUTH DAILY WITH BREAKFAST Patient not taking: Reported on 08/14/2020 01/21/20 01/20/21  Derek Jack, MD    Physical Exam: Vitals:   08/14/20 1557 08/14/20 1637 08/14/20 1700 08/14/20 1715  BP:  (!) 121/96 (!) 113/95   Pulse:    90  Resp: (!) 24 (!) 29 (!) 29 (!) 30  Temp:      TempSrc:      SpO2:    91%    Constitutional: Chronically ill-appearing, mild to moderate respiratory distress Vitals:   08/14/20 1557 08/14/20 1637 08/14/20 1700 08/14/20 1715  BP:  (!) 121/96 (!) 113/95   Pulse:    90  Resp: (!) 24 (!) 29 (!) 29 (!) 30  Temp:      TempSrc:      SpO2:    91%   Eyes: PERRL, lids and conjunctivae normal ENMT: Mucous membranes are dry Neck: normal, supple, no masses, no thyromegaly Respiratory: clear to auscultation bilaterally, no wheezing, no crackles.  Mild to moderate increased work of breathing on nonrebreather.  Cardiovascular: Regular rate and rhythm, no murmurs / rubs / gallops. No extremity edema. 2+ pedal pulses. No carotid bruits.  Abdomen: no tenderness, no masses palpated. No hepatosplenomegaly. Bowel sounds positive.  Musculoskeletal: no clubbing / cyanosis. No joint deformity upper and lower  extremities. Good ROM, no contractures. Normal muscle tone.  Skin: Port left upper chest- no drainage or erythema apprecaited, without no rashes, lesions, ulcers. No induration Neurologic: No apparent cranial nerve abnormalities, moving extremities spontaneously. Psychiatric: Normal judgment and insight. Alert and oriented x 3. Normal mood.   Labs on Admission: I have personally reviewed following labs and imaging studies  CBC: Recent Labs  Lab 08/14/20 1208  WBC 9.3  NEUTROABS 6.9  HGB 11.6*  HCT 35.7*  MCV 97.0  PLT 323   Basic Metabolic Panel: Recent Labs  Lab 08/14/20 1208  NA 136  K 3.6  CL 104  CO2 12*  GLUCOSE 106*  BUN 53*  CREATININE 2.68*  CALCIUM 8.0*   Liver Function Tests: Recent Labs  Lab 08/14/20 1208  AST 106*  ALT 66*  ALKPHOS 182*  BILITOT 2.5*  PROT 7.8  ALBUMIN 3.6   Coagulation Profile: Recent Labs  Lab 08/14/20 1208  INR 1.6*   Radiological Exams on Admission: DG Chest 1 View  Result Date: 08/14/2020 CLINICAL DATA:  Short of breath EXAM: CHEST  1 VIEW COMPARISON:  CT chest 08/03/2019 FINDINGS: Left chest wall port a catheter noted with tip in the distal SVC. Mild cardiac enlargement. Small bilateral pleural effusions and pulmonary vascular congestion identified. Chronic interstitial coarsening of COPD/emphysema noted. Diffuse sclerotic bone metastases are noted. IMPRESSION: 1. Cardiac enlargement and CHF. 2. Diffuse sclerotic bone metastases. Electronically Signed   By: Kerby Moors M.D.   On: 08/14/2020 13:05   NM Pulmonary Perfusion  Result Date: 08/14/2020 CLINICAL DATA:  Shortness of breath for 1 day EXAM: NUCLEAR MEDICINE PERFUSION LUNG SCAN TECHNIQUE: Perfusion images were obtained in multiple projections after intravenous injection of radiopharmaceutical. Ventilation scans intentionally deferred if perfusion scan and chest x-ray adequate for interpretation during COVID 19 epidemic. RADIOPHARMACEUTICALS:  4.4 mCi Tc-78m MAA IV  COMPARISON:  None Correlation: Chest radiograph 08/14/2020 FINDINGS: Thickening of the RIGHT fissures and minutes perfusion in LEFT lower lobe likely related to RIGHT pleural effusion. Artifact in LEFT upper lobe on LAO view, due to attenuation by patient's arm. Inhomogeneous perfusion throughout both lungs corresponding to infiltrates and RIGHT pleural effusion seen radiographically. No definite evidence of pulmonary embolism identified. IMPRESSION: Nonsegmental perfusion changes related to pleural effusion and BILATERAL pulmonary infiltrates. No definite evidence of pulmonary embolism. Electronically Signed   By: Lavonia Dana M.D.   On: 08/14/2020 16:53   ECHOCARDIOGRAM COMPLETE  Result Date: 08/14/2020    ECHOCARDIOGRAM REPORT   Patient Name:   Brysun Cleda Clarks Date of Exam: 08/14/2020 Medical Rec #:  [id number]       Height:       66.0 in Accession #:    [id number]      Weight:       152.6 lb Date of Birth:  1945-10-19       BSA:          1.783 m Patient Age:    66 years        BP:           102/80 mmHg Patient Gender: M               HR:           107 bpm. Exam Location:  [facility] Procedure: 2D Echo Indications:    Dyspnea R06.00  History:        Patient has prior history of Echocardiogram examinations, most                 recent 10/26/2019. Stroke, Signs/Symptoms:Bacteremia; Risk                 Factors:Current Smoker. Cocaine Abuse, Sepsis, Acute Renal                 Failure.  Sonographer:    Leavy Cella RDCS (AE) Referring Phys: [id number] [place]  1. Left ventricular ejection fraction, by estimation, is 10-15%. The left ventricle has severely decreased function. The left ventricle demonstrates global hypokinesis. The left ventricular internal cavity size was mildly dilated. Left ventricular diastolic parameters are consistent with Grade III diastolic dysfunction (restrictive). Elevated left atrial pressure.  2. Right ventricular systolic function moderately to severely reduced. The  right ventricular size is mildly enlarged.  3. Left atrial size was moderately dilated.  4. Right atrial size was moderately dilated.  5. The pericardial effusion is circumferential. Moderate pleural effusion in the left lateral region.  6. MR is eccentric and may be underestimated. . The mitral valve is abnormal. Moderate mitral valve regurgitation. No evidence of mitral stenosis.  7. The tricuspid valve is abnormal. Tricuspid valve regurgitation is moderate.  8. The aortic valve is tricuspid. There is mild calcification of the aortic valve. There is mild thickening of the aortic valve. Aortic valve regurgitation is mild. No aortic stenosis is present.  9. 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 10. The inferior vena cava is dilated in size with <50% respiratory variability, suggesting right atrial pressure of 15 mmHg. FINDINGS  Left Ventricle: Left ventricular ejection fraction, by estimation, is 10-15%. The left ventricle has severely decreased function. The left ventricle demonstrates global hypokinesis. The left ventricular internal cavity size was mildly dilated. There is no left ventricular hypertrophy. Left ventricular diastolic parameters are consistent with Grade III diastolic dysfunction (restrictive). Elevated left atrial pressure. Right Ventricle: The right ventricular size is mildly enlarged. Right vetricular wall thickness was not assessed. Right ventricular systolic function moderately to severely reduced. Left Atrium: Left atrial size was moderately dilated. Right Atrium: Right atrial size was moderately dilated. Pericardium: Trivial pericardial effusion is present. The pericardial effusion is circumferential. Mitral Valve: MR is eccentric and may be underestimated. The mitral valve is abnormal. There is mild thickening of the mitral valve leaflet(s). There is mild calcification of the mitral valve leaflet(s). Mild mitral annular calcification. Moderate mitral  valve regurgitation. No  evidence of mitral valve stenosis. Tricuspid Valve: The tricuspid valve is abnormal. Tricuspid valve regurgitation is moderate . No evidence of tricuspid stenosis. Aortic Valve: The aortic valve is tricuspid. There is mild calcification of the aortic valve. There is mild thickening of the aortic valve. There is mild aortic valve annular calcification. Aortic valve regurgitation is mild. No aortic stenosis is present. Aortic valve mean gradient measures 3.0 mmHg. Aortic valve peak gradient measures 5.9 mmHg. Pulmonic Valve: The pulmonic valve was not well visualized. Pulmonic valve regurgitation is mild. No evidence of pulmonic stenosis. Aorta: The aortic root is normal in size and structure. 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 Venous: The inferior vena cava is dilated in size with less than 50% respiratory variability, suggesting right atrial pressure of 15 mmHg. IAS/Shunts: No atrial level shunt detected by color flow Doppler. Additional Comments: There is a moderate pleural effusion in the left lateral region.  LEFT VENTRICLE PLAX 2D LVIDd:         5.91 cm  Diastology LVIDs:         5.61 cm  LV e' medial:    4.19 cm/s LV PW:         0.95 cm  LV E/e' medial:  29.4 LV IVS:        0.92 cm  LV e' lateral:   6.80 cm/s LVOT diam:     1.90 cm  LV E/e' lateral: 18.1 LVOT Area:     2.84 cm  RIGHT VENTRICLE RV S prime:     6.01 cm/s TAPSE (M-mode): 0.9 cm LEFT ATRIUM             Index       RIGHT ATRIUM           Index LA diam:        4.50 cm 2.52 cm/m  RA Area:     17.80 cm LA Vol (A2C):   77.9 ml 43.70 ml/m RA Volume:   56.30 ml  31.58 ml/m LA Vol (A4C):  62.7 ml 35.17 ml/m LA Biplane Vol: 70.3 ml 39.44 ml/m  AORTIC VALVE AV Vmax:      121.62 cm/s AV Vmean:     81.192 cm/s AV VTI:       0.149 m AV Peak Grad: 5.9 mmHg AV Mean Grad: 3.0 mmHg  AORTA Ao Root diam: 3.10 cm MITRAL VALVE                TRICUSPID VALVE MV Area (PHT): 5.79 cm     TR Peak grad:   46.5 mmHg MV Decel Time: 131  msec     TR Vmax:        341.00 cm/s MR Peak grad: 84.3 mmHg MR Mean grad: 57.0 mmHg     SHUNTS MR Vmax:      459.00 cm/s   Systemic Diam: 1.90 cm MR Vmean:     350.0 cm/s MV E velocity: 123.00 cm/s MV A velocity: 50.30 cm/s MV E/A ratio:  2.45 Carlyle Dolly MD Electronically signed by Carlyle Dolly MD Signature Date/Time: 08/14/2020/3:38:27 PM    Final     EKG: Independently reviewed.  Sinus tachycardia rate 107.  QTc 418.  PVCs.  No significant change from prior.  Assessment/Plan Principal Problem:   Acute on chronic systolic CHF (congestive heart failure) (HCC) Active Problems:   Cardiogenic shock (HCC)   Acute respiratory failure with hypoxemia (HCC)   Lactic acidosis   Elevated troponin   Acute renal failure (HCC)   Malignant neoplasm of prostate (HCC)   CKD (chronic kidney disease) stage 3, GFR 30-59 ml/min (HCC)   Prostate cancer metastatic to bone (HCC)   Acute on chronic systolic CHF/cardiogenic shock-portable chest x-ray shows cardiomegaly with small bilateral pleural effusions and pulmonary vascular congestion.  No significant peripheral edema.  BNP markedly elevated at greater than 4500.  Worsening systolic function with echo today showing EF of 10 to 15% with grade 3 restrictive diastolic dysfunction.  Previous 10/2019 EF was 35%.  Significant lactic acidosis of 7.9 and elevated liver enzymes suggesting cardiogenic shock.  With troponin 1279 >> 199. With low Co-ox of 23%. -Cardiology consulted, started on dobutamine drip, IV Lasix, IV heparin.  Patient not a urgent candidate for cath given renal failure and prostate cancer.  Recommendations appreciated -Strict Input output, daily weight - Monitor creatinine closely -Palliative care consult. -IV Lasix 80 mg x 1 given.  Further dosing pending response to inotrope and cardiology evaluation.  Acute respiratory failure-O2 sat 76% on room air, currently on nonrebreather sats > 90%.  ABG pH of 7.2, PCO2 28, PO2 less than 31.  Likely  from decompensated CHF.  VQ scan negative for PE, bilateral lower extremity Dopplers negative for DVT. -DO NOT INTUBATE, BiPAP if needed  Significant lactic acidosis of 7.9 X 2 .  Likely secondary to cardiogenic shock, cannot rule out concomitant septic shock with procalcitonin of 1.98.  At this time he is afebrile, with WBC of 9.3. -Broad-spectrum antibiotics-IV Vanco, cefepime and metronidazole.  AKI on CKD 3B- Creatinine 2.68, baseline 1.3-1.6 - Diurese  Elevated liver enzymes, AST 106, ALT 66, ALP 182, total bilirubin 2.5.  Likely due to cardiogenic shock. -Trend.  Metastatic prostate cancer to bones and retroperitoneal adenopathy- s/p bilateral orchiectomy 2015, currently on chemotherapy.  Follows with Dr. Delton Coombes.  History of CVA  CRITICAL CARE Performed by: Bethena Roys   Total critical care time: 70 minutes  Critical care time was exclusive of separately billable procedures and treating other patients.  Critical care was  necessary to treat or prevent imminent or life-threatening deterioration.  Critical care was time spent personally by me on the following activities: development of treatment plan with patient and/or surrogate as well as nursing, discussions with consultants, evaluation of patient's response to treatment, examination of patient, obtaining history from patient or surrogate, ordering and performing treatments and interventions, ordering and review of laboratory studies, ordering and review of radiographic studies, pulse oximetry and re-evaluation of patient's condition.   DVT prophylaxis: Heparin Code Status: DNR.  I talked the patient about current severity of his illness, with new severe heart failure, with worsening renal function.  I discussed prognosis with patient, and futility of CPR or other aggressive interventions.  He agreed and voiced understanding.  He is able to verbalize and repeat back what I said with understanding.  He has agreed to  DNR status.  He tells me he has no family.  Patient gave me the contact number of his friend- [name], and request that I call and talk to him.   I have talked to patient's friend [name], he requested to come see patient in the hospital which I have agreed to.  Current severity of illness and poor prognosis have been explained to patient's friend also, including DNR status. Family Communication: None at bedside.  Talked to patient's friend [name] on the phone. Disposition Plan: > 2 days.  Poor prognosis. Consults called: Cardiology, critical care Admission status: Inpatient, ICU. I certify that at the point of admission it is my clinical judgment that the patient will require inpatient hospital care spanning beyond 2 midnights from the point of admission due to high intensity of service, high risk for further deterioration and high frequency of surveillance required.    Bethena Roys MD [facility]  08/14/2020, 9:32 PM

## 2020-08-15 ENCOUNTER — Other Ambulatory Visit: Payer: Self-pay

## 2020-08-15 ENCOUNTER — Encounter (HOSPITAL_COMMUNITY): Payer: Self-pay | Admitting: Internal Medicine

## 2020-08-15 DIAGNOSIS — I5043 Acute on chronic combined systolic (congestive) and diastolic (congestive) heart failure: Secondary | ICD-10-CM | POA: Diagnosis present

## 2020-08-15 DIAGNOSIS — R6510 Systemic inflammatory response syndrome (SIRS) of non-infectious origin without acute organ dysfunction: Secondary | ICD-10-CM | POA: Insufficient documentation

## 2020-08-15 LAB — CBC
HCT: 35.2 % — ABNORMAL LOW (ref 39.0–52.0)
Hemoglobin: 11.4 g/dL — ABNORMAL LOW (ref 13.0–17.0)
MCH: 30.3 pg (ref 26.0–34.0)
MCHC: 32.4 g/dL (ref 30.0–36.0)
MCV: 93.6 fL (ref 80.0–100.0)
Platelets: 194 10*3/uL (ref 150–400)
RBC: 3.76 MIL/uL — ABNORMAL LOW (ref 4.22–5.81)
RDW: 18.8 % — ABNORMAL HIGH (ref 11.5–15.5)
WBC: 11.6 10*3/uL — ABNORMAL HIGH (ref 4.0–10.5)
nRBC: 2.4 % — ABNORMAL HIGH (ref 0.0–0.2)

## 2020-08-15 LAB — COOXEMETRY PANEL
Carboxyhemoglobin: 1 % (ref 0.5–1.5)
Methemoglobin: 1 % (ref 0.0–1.5)
O2 Saturation: 63.4 %
Total hemoglobin: 10.7 g/dL — ABNORMAL LOW (ref 12.0–16.0)

## 2020-08-15 LAB — URINALYSIS, ROUTINE W REFLEX MICROSCOPIC
Bilirubin Urine: NEGATIVE
Glucose, UA: NEGATIVE mg/dL
Ketones, ur: NEGATIVE mg/dL
Nitrite: NEGATIVE
Protein, ur: NEGATIVE mg/dL
Specific Gravity, Urine: 1.006 (ref 1.005–1.030)
pH: 5 (ref 5.0–8.0)

## 2020-08-15 LAB — LACTIC ACID, PLASMA: Lactic Acid, Venous: 3.4 mmol/L (ref 0.5–1.9)

## 2020-08-15 LAB — HEPATIC FUNCTION PANEL
ALT: 111 U/L — ABNORMAL HIGH (ref 0–44)
AST: 182 U/L — ABNORMAL HIGH (ref 15–41)
Albumin: 3.4 g/dL — ABNORMAL LOW (ref 3.5–5.0)
Alkaline Phosphatase: 227 U/L — ABNORMAL HIGH (ref 38–126)
Bilirubin, Direct: 0.8 mg/dL — ABNORMAL HIGH (ref 0.0–0.2)
Indirect Bilirubin: 1.2 mg/dL — ABNORMAL HIGH (ref 0.3–0.9)
Total Bilirubin: 2 mg/dL — ABNORMAL HIGH (ref 0.3–1.2)
Total Protein: 7.5 g/dL (ref 6.5–8.1)

## 2020-08-15 LAB — RAPID URINE DRUG SCREEN, HOSP PERFORMED
Amphetamines: NOT DETECTED
Barbiturates: NOT DETECTED
Benzodiazepines: NOT DETECTED
Cocaine: NOT DETECTED
Opiates: NOT DETECTED
Tetrahydrocannabinol: NOT DETECTED

## 2020-08-15 LAB — BASIC METABOLIC PANEL
Anion gap: 16 — ABNORMAL HIGH (ref 5–15)
BUN: 61 mg/dL — ABNORMAL HIGH (ref 8–23)
CO2: 16 mmol/L — ABNORMAL LOW (ref 22–32)
Calcium: 8.2 mg/dL — ABNORMAL LOW (ref 8.9–10.3)
Chloride: 106 mmol/L (ref 98–111)
Creatinine, Ser: 2.6 mg/dL — ABNORMAL HIGH (ref 0.61–1.24)
GFR, Estimated: 25 mL/min — ABNORMAL LOW (ref >60)
Glucose, Bld: 134 mg/dL — ABNORMAL HIGH (ref 70–99)
Potassium: 3.8 mmol/L (ref 3.5–5.1)
Sodium: 138 mmol/L (ref 135–145)

## 2020-08-15 LAB — TROPONIN I (HIGH SENSITIVITY)
Troponin I (High Sensitivity): 194 ng/L (ref <18)
Troponin I (High Sensitivity): 259 ng/L (ref <18)

## 2020-08-15 LAB — HEPARIN LEVEL (UNFRACTIONATED): Heparin Unfractionated: 0.55 IU/mL (ref 0.30–0.70)

## 2020-08-15 LAB — PROCALCITONIN: Procalcitonin: 3.36 ng/mL

## 2020-08-15 LAB — MAGNESIUM: Magnesium: 2 mg/dL (ref 1.7–2.4)

## 2020-08-15 LAB — MRSA PCR SCREENING: MRSA by PCR: NEGATIVE

## 2020-08-15 MED ORDER — AMIODARONE HCL IN DEXTROSE 360-4.14 MG/200ML-% IV SOLN
60.0000 mg/h | INTRAVENOUS | Status: DC
  Administered 2020-08-16: 60 mg/h via INTRAVENOUS
  Filled 2020-08-15 (×2): qty 200

## 2020-08-15 MED ORDER — MIDODRINE HCL 5 MG PO TABS: 10.0000 mg | ORAL_TABLET | Freq: Three times a day (TID) | ORAL | Status: DC

## 2020-08-15 MED ORDER — AMIODARONE HCL IN DEXTROSE 360-4.14 MG/200ML-% IV SOLN
30.0000 mg/h | INTRAVENOUS | Status: DC
  Administered 2020-08-16: 30 mg/h via INTRAVENOUS
  Filled 2020-08-15: qty 200

## 2020-08-15 MED ORDER — SODIUM CHLORIDE 0.9% FLUSH
10.0000 mL | Freq: Two times a day (BID) | INTRAVENOUS | Status: DC
  Administered 2020-08-16 (×3): 10 mL
  Administered 2020-08-18: 20 mL
  Administered 2020-08-18 – 2020-08-19 (×2): 10 mL

## 2020-08-15 MED ORDER — PANTOPRAZOLE SODIUM 40 MG PO TBEC
40.0000 mg | DELAYED_RELEASE_TABLET | Freq: Every day | ORAL | Status: DC
  Administered 2020-08-15 – 2020-08-17 (×3): 40 mg via ORAL
  Filled 2020-08-15 (×4): qty 1

## 2020-08-15 MED ORDER — MILRINONE LACTATE IN DEXTROSE 20-5 MG/100ML-% IV SOLN
0.5000 ug/kg/min | INTRAVENOUS | Status: DC
  Administered 2020-08-15: 0.125 ug/kg/min via INTRAVENOUS
  Administered 2020-08-16: 0.375 ug/kg/min via INTRAVENOUS
  Filled 2020-08-15 (×2): qty 100

## 2020-08-15 MED ORDER — CHLORHEXIDINE GLUCONATE CLOTH 2 % EX PADS
6.0000 | MEDICATED_PAD | Freq: Every day | CUTANEOUS | Status: DC
  Administered 2020-08-15 – 2020-08-19 (×4): 6 via TOPICAL

## 2020-08-15 MED ORDER — ALBUTEROL SULFATE HFA 108 (90 BASE) MCG/ACT IN AERS
2.0000 | INHALATION_SPRAY | RESPIRATORY_TRACT | Status: DC | PRN
  Filled 2020-08-15: qty 6.7

## 2020-08-15 MED ORDER — AMIODARONE LOAD VIA INFUSION
150.0000 mg | Freq: Once | INTRAVENOUS | Status: AC
  Administered 2020-08-15: 150 mg via INTRAVENOUS
  Filled 2020-08-15: qty 83.34

## 2020-08-15 MED ORDER — SODIUM CHLORIDE 0.9% FLUSH: 10.0000 mL | INTRAVENOUS | Status: DC | PRN

## 2020-08-15 MED ORDER — POTASSIUM CHLORIDE CRYS ER 20 MEQ PO TBCR
40.0000 meq | EXTENDED_RELEASE_TABLET | Freq: Once | ORAL | Status: AC
  Administered 2020-08-15: 40 meq via ORAL
  Filled 2020-08-15: qty 2

## 2020-08-15 NOTE — Progress Notes (Addendum)
ANTICOAGULATION CONSULT NOTE -   Pharmacy Consult for heparin Indication: chest pain/ACS/possible PE  No Known Allergies  Patient Measurements: Height: 6' (182.9 cm) Weight: 65.7 kg (144 lb 13.5 oz) IBW/kg (Calculated) : 77.6 Heparin Dosing Weight: 69.2 kg  Vital Signs: Temp: 97.6 F (36.4 C) (04/22 0402) Temp Source: Oral (04/22 0402) BP: 128/105 (04/22 0700) Pulse Rate: 67 (04/22 0700)  Labs: Recent Labs    08/14/20 1208 08/14/20 1425 08/14/20 2210 08/15/20 0410 08/15/20 0434  HGB 11.6*  --   --  11.4*  --   HCT 35.7*  --   --  35.2*  --   PLT 205  --   --  194  --   APTT 50*  --   --   --   --   LABPROT 19.1*  --   --   --   --   INR 1.6*  --   --   --   --   HEPARINUNFRC  --   --  0.64 0.55  --   CREATININE 2.68*  --   --  2.60*  --   TROPONINIHS 1,279* 199*  --   --  259*    Estimated Creatinine Clearance: 23.5 mL/min (A) (by C-G formula based on SCr of 2.6 mg/dL (H)).   Medical History: Past Medical History:  Diagnosis Date  . Acute renal failure ([place]) 01/26/2014  . Alcohol abuse   . Anemia of chronic disease 01/26/2014  . Bilateral hydronephrosis 01/26/2014  . History of cocaine abuse ([place]) 01/26/2014  . Homelessness 01/31/2014  . Prostate cancer metastatic to multiple sites ([place]) 01/30/2014  . Sepsis ([place])   . UTI (lower urinary tract infection)     Medications:  Medications Prior to Admission  Medication Sig Dispense Refill Last Dose  . abiraterone acetate (ZYTIGA) 250 MG tablet TAKE 4 TABLETS (1,000 MG TOTAL) BY MOUTH DAILY. TAKE ON AN EMPTY STOMACH 1 HOUR BEFORE OR 2 HOURS AFTER A MEAL (Patient taking differently: Take by mouth daily. Take on an empty stomach 1 hour before or 2 hours after a meal.) 120 tablet 0 08/14/2020 at Unknown time  . aspirin EC 81 MG EC tablet Take 1 tablet (81 mg total) by mouth daily. Swallow whole. 30 tablet 11 08/14/2020 at 0800  . atorvastatin (LIPITOR) 40 MG tablet Take 1 tablet (40 mg total) by mouth daily.   08/13/2020  at Unknown time  . famotidine (PEPCID) 10 MG tablet Take 1 tablet (10 mg total) by mouth daily as needed for heartburn or indigestion.   08/14/2020 at Unknown time  . Menthol-Methyl Salicylate (MUSCLE RUB) 10-15 % CREA Apply 1 application topically 2 (two) times daily as needed for muscle pain. To back and hips  0   . midodrine (PROAMATINE) 10 MG tablet Take 1 tablet (10 mg total) by mouth 3 (three) times daily with meals.   08/14/2020 at Unknown time  . simethicone (MYLICON) 80 MG chewable tablet Chew 1 tablet (80 mg total) by mouth 4 (four) times daily as needed for flatulence. 30 tablet 0 08/14/2020 at Unknown time  . predniSONE (DELTASONE) 5 MG tablet Take 1 tablet (5 mg total) by mouth daily with breakfast. (Patient not taking: Reported on 08/14/2020) 30 tablet 6 Not Taking at Unknown time  . predniSONE (DELTASONE) 5 MG tablet TAKE 1 TABLET BY MOUTH DAILY WITH BREAKFAST (Patient not taking: Reported on 08/14/2020) 30 tablet 6 Not Taking at Unknown time    Assessment: Pharmacy consulted to dose heparin in  patient high suspicion for PE due to hypoxia, tachycardia, tachypnea without a fever in the setting of cancer treatment.  Less likely ACS as patient is without pain.  Patient is not on anticoagulation prior to admission.  Imaging negative for PE and DVT  HL 0.55- therapeutic Troponin 1279>>259 CBC WNL  Goal of Therapy:  Heparin level 0.3-0.7 units/ml Monitor platelets by anticoagulation protocol: Yes   Plan:  Continue heparin infusion at 1200 units/hr Check anti-Xa level daily while on heparin Continue to monitor H&H and platelets.   Margot Ables, PharmD Clinical Pharmacist 08/15/2020 7:38 AM

## 2020-08-15 NOTE — Progress Notes (Addendum)
Patient Demographics:    Jeff [name], is a 75 y.o. male, DOB - Jan 24, 1946, NGB:[id number]  Admit date - 08/14/2020   Admitting Physician Ejiroghene Arlyce Dice, MD  Outpatient Primary MD for the patient is Patient, No Pcp Per (Inactive)  LOS - 1   Chief Complaint  Patient presents with  . 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 male with medical history significant for systolic and diastolic CHF, prostate cancer on chemotherapy--- admitted on 08/15/2019 with cardiogenic shock and persistent tachycardia and findings consistent with SIRs  A/p 1)Acute on Chronic Combined Systolic and Diastolic CHF/Biventricular Failure------ now with cardiogenic shock -Repeat echo on 08/14/20 with EF of 10 to 15%, with grade 2 diastolic dysfunction, and moderate to severely reduced RV function, moderate TR moderate MR and moderate to severe pulmonary HTN with PSAP of 63 mmhg -as per Dr. Domenic Polite and will stop Dobutamine and switch to Milrinone. Will start at 0.125 mcg/kg/min -BNP greater than 4500, chest x-ray consistent with CHF -Transfer to [facility], ICU--Dr. Halford Chessman accepting -Patient will need advanced heart failure consultation, ??  If is  a candidate for right heart cath -Probably not a candidate for left heart cath at this time due to elevated creatinine avoid ACEI/ARB/ARNI due to renal concerns -Diuretics when BP allows -  2)Tachyarrhythmia--- MAT Versus paroxysmal A. Fib, defer further management to cardiology team -Keep magnesium around 2 and potassium around 4 -No beta-blocker due to soft BP -Cardizem contraindicated due to low EF -Avoid amiodarone due to elevated LFTs -Lower extremity venous Dopplers and VQ scan negative for VTE  3)Elevated Troponins--- in the setting of renal dysfunction, CHF exacerbation and metabolic and lactic acidosis -Echo as noted above in #1 -Continue aspirin, IV heparin, hold off on beta-blockers due to soft BP, hold off on Lipitor due to elevated liver function test  4)AKI----acute kidney injury on CKD stage - 3b due to renal hypoperfusion in the setting of cardiogenic shock -Creatinine currently 2.6 baseline usually between 1.3 and 1.6 -- renally adjust medications, avoid nephrotoxic agents / dehydration  / hypotension  5)Metastatic prostate cancer to bones and retroperitoneal adenopathy- s/p bilateral orchiectomy 2015, currently on chemotherapy.  Follows with Dr. Delton Coombes.  6) possible infection/SIRS--- UA suspicious for UTI, -Continue IV vancomycin, Flagyl and cefepime pending urine and blood culture data -PCT elevated at 3.36 -Lactic acid down to 3.4 from 7.9 -WBC is up to 11.6 from 9.3  7) COPD/emphysema--dyspnea improving, use bronchodilators as needed, no acute exacerbation noted  8) acute  hypoxic respiratory failure--most likely due to CHF as above #1 rather than COPD, -Hypoxia resolving with treatment of CHF above #1 -Lower extremity venous Dopplers and VQ scan negative for VTE  9)Elevated LFTS-- ??  Shock liver, avoid hepatotoxic agent  Hepatic Function Latest Ref Rng & Units 08/15/2020 08/14/2020 08/07/2020  Total Protein 6.5 - 8.1 g/dL 7.5 7.8 8.3(H)  Albumin 3.5 -  5.0 g/dL 3.4(L) 3.6 3.7  AST 15 - 41 U/L 182(H) 106(H) 21  ALT 0 - 44 U/L 111(H) 66(H) 12  Alk Phosphatase 38 - 126 U/L 227(H) 182(H) 64  Total Bilirubin 0.3 - 1.2 mg/dL 2.0(H) 2.5(H) 0.6  Bilirubin, Direct 0.0 - 0.2 mg/dL 0.8(H) - -    10) history of prior CVA--continue aspirin, hold Lipitor due to elevated LFTs  11) social /ethics--- discussed with patient and patient's HCPOA Mr. [name]--- patient is a DNR/DNI but request full scope of treatment -No limitations to treatment -Prior history of polysubstance abuse, UDS negative this admission  CRITICAL CARE Performed by: Roxan Hockey   Total critical care time: 43 minutes  Critical care time was exclusive of separately billable procedures and treating other patients.  Critical care was necessary to treat or prevent imminent or life-threatening deterioration.  due to cardiogenic shock requiring inotropes, possible NSTEMI requiring IV heparin*  Critical care was time spent personally by me on the following activities: development of treatment plan with patient and/or surrogate as well as nursing, discussions with consultants, evaluation of patient's response to treatment, examination of patient, obtaining history from patient or surrogate, ordering and performing treatments and interventions, ordering and review of laboratory studies, ordering and review of radiographic studies, pulse oximetry and re-evaluation of patient's condition.   Disposition/Need for in-Hospital Stay- patient unable to be discharged at this time due to transfer to [facility], ICU--- PCCM service due to cardiogenic shock requiring inotropes, possible NSTEMI requiring IV heparin*  Status is: Inpatient  Remains inpatient appropriate because:Hemodynamically unstable and Please see above   Disposition: The patient is from: Home              Anticipated d/c is to: TBD              Anticipated d/c date is: > 3 days              Patient currently is not  medically stable to d/c. Barriers: Not Clinically Stable-   Code Status : -  Code Status: DNR  (No limitations to treatment--patient requests full scope of treatment)  Family Communication:     (patient is alert, awake and coherent)  Discussed with HCPOA--Mr [name]  Consults  :  PCCM/Card  DVT Prophylaxis  :   - SCDs /iv heparin     Lab Results  Component Value Date   PLT 194 08/15/2020    Inpatient Medications  Scheduled Meds: . aspirin EC  81 mg Oral Daily  . Chlorhexidine Gluconate Cloth  6 each Topical Daily   Continuous Infusions: . ceFEPime (MAXIPIME) IV Stopped (08/14/20 2008)  . heparin 1,200 Units/hr (08/15/20 1501)  . metronidazole Stopped (08/15/20 1105)  . milrinone 0.125 mcg/kg/min (08/15/20 1501)  . [START ON 08/16/2020] vancomycin     PRN Meds:.acetaminophen **OR** acetaminophen, ondansetron **OR** ondansetron (ZOFRAN) IV, polyethylene glycol    Anti-infectives (From admission, onward)   Start     Dose/Rate Route Frequency Ordered Stop   08/16/20 1800  vancomycin (VANCOCIN) IVPB 1000 mg/200 mL premix  1,000 mg 200 mL/hr over 60 Minutes Intravenous Every 48 hours 08/14/20 1751     08/14/20 1830  vancomycin (VANCOREADY) IVPB 1500 mg/300 mL        1,500 mg 150 mL/hr over 120 Minutes Intravenous  Once 08/14/20 1750 08/14/20 2326   08/14/20 1800  metroNIDAZOLE (FLAGYL) IVPB 500 mg        500 mg 100 mL/hr over 60 Minutes Intravenous Every 8 hours 08/14/20 1741     08/14/20 1800  ceFEPIme (MAXIPIME) 2 g in sodium chloride 0.9 % 100 mL IVPB        2 g 200 mL/hr over 30 Minutes Intravenous Every 24 hours 08/14/20 1750          Objective:   Vitals:   08/15/20 1415 08/15/20 1430 08/15/20 1445 08/15/20 1500  BP: (!) 108/55 91/60 (!) 118/54 107/69  Pulse: (!) 36 80 (!) 54 (!) 102  Resp: 14 14 (!) 23 19  Temp:    98.4 F (36.9 C)  TempSrc:      SpO2: 96% 95% 97% 93%  Weight:      Height:        Wt Readings from Last 3 Encounters:   08/15/20 65.7 kg  08/07/20 69.2 kg  06/09/20 74.6 kg     Intake/Output Summary (Last 24 hours) at 08/15/2020 1706 Last data filed at 08/15/2020 1501 Gross per 24 hour  Intake 1096.88 ml  Output 700 ml  Net 396.88 ml    Physical Exam  Gen:- Awake Alert,  In no apparent distress  HEENT:- [place].AT, No sclera icterus Neck-Supple Neck, .  Lungs-diminished in bases with scattered rales CV- S1, S2 normal, irregular, left chest wall with Port-A-Cath in situ Abd-  +ve B.Sounds, Abd Soft, No tenderness,    Extremity/Skin:- +  edema, pedal pulses present  Psych-affect is appropriate, oriented x3 Neuro-generalized weakness, no new focal deficits, no tremors   Data Review:   Micro Results Recent Results (from the past 240 hour(s))  Resp Panel by RT-PCR (Flu A&B, Covid) Nasopharyngeal Swab     Status: None   Collection Time: 08/14/20 12:19 PM   Specimen: Nasopharyngeal Swab; Nasopharyngeal(NP) swabs in vial transport medium  Result Value Ref Range Status   SARS Coronavirus 2 by RT PCR NEGATIVE NEGATIVE Final    Comment: (NOTE) SARS-CoV-2 target nucleic acids are NOT DETECTED.  The SARS-CoV-2 RNA is generally detectable in upper respiratory specimens during the acute phase of infection. The lowest concentration of SARS-CoV-2 viral copies this assay can detect is 138 copies/mL. A negative result does not preclude SARS-Cov-2 infection and should not be used as the sole basis for treatment or other patient management decisions. A negative result may occur with  improper specimen collection/handling, submission of specimen other than nasopharyngeal swab, presence of viral mutation(s) within the areas targeted by this assay, and inadequate number of viral copies(<138 copies/mL). A negative result must be combined with clinical observations, patient history, and epidemiological information. The expected result is Negative.  Fact Sheet for Patients:   EntrepreneurPulse.com.au  Fact Sheet for Healthcare Providers:  IncredibleEmployment.be  This test is no t yet approved or cleared by the [place] FDA and  has been authorized for detection and/or diagnosis of SARS-CoV-2 by FDA under an Emergency Use Authorization (EUA). This EUA will remain  in effect (meaning this test can be used) for the duration of the COVID-19 declaration under Section 564(b)(1) of the Act, 21 U.S.C.section 360bbb-3(b)(1), unless the authorization is terminated  or revoked  sooner.       Influenza A by PCR NEGATIVE NEGATIVE Final   Influenza B by PCR NEGATIVE NEGATIVE Final    Comment: (NOTE) The Xpert Xpress SARS-CoV-2/FLU/RSV plus assay is intended as an aid in the diagnosis of influenza from Nasopharyngeal swab specimens and should not be used as a sole basis for treatment. Nasal washings and aspirates are unacceptable for Xpert Xpress SARS-CoV-2/FLU/RSV testing.  Fact Sheet for Patients: EntrepreneurPulse.com.au  Fact Sheet for Healthcare Providers: IncredibleEmployment.be  This test is not yet approved or cleared by the [place] FDA and has been authorized for detection and/or diagnosis of SARS-CoV-2 by FDA under an Emergency Use Authorization (EUA). This EUA will remain in effect (meaning this test can be used) for the duration of the COVID-19 declaration under Section 564(b)(1) of the Act, 21 U.S.C. section 360bbb-3(b)(1), unless the authorization is terminated or revoked.  Performed at [facility], [address], [place], [place] [address]   Blood culture (routine x 2)     Status: None (Preliminary result)   Collection Time: 08/14/20  1:27 PM   Specimen: Porta Cath; Blood  Result Value Ref Range Status   Specimen Description PORTA CATH  Final   Special Requests   Final    Blood Culture adequate volume BOTTLES DRAWN AEROBIC AND ANAEROBIC   Culture   Final     NO GROWTH < 24 HOURS Performed at [facility], [address], [place], [place] [address]    Report Status PENDING  Incomplete  Blood culture (routine x 2)     Status: None (Preliminary result)   Collection Time: 08/14/20  1:27 PM   Specimen: BLOOD LEFT HAND  Result Value Ref Range Status   Specimen Description BLOOD LEFT HAND  Final   Special Requests   Final    Blood Culture adequate volume BOTTLES DRAWN AEROBIC AND ANAEROBIC   Culture   Final    NO GROWTH < 24 HOURS Performed at [facility], [address], [place], [place] [address]    Report Status PENDING  Incomplete  MRSA PCR Screening     Status: None   Collection Time: 08/14/20 10:52 PM   Specimen: Nasal Mucosa; Nasopharyngeal  Result Value Ref Range Status   MRSA by PCR NEGATIVE NEGATIVE Final    Comment:        The GeneXpert MRSA Assay (FDA approved for NASAL specimens only), is one component of a comprehensive MRSA colonization surveillance program. It is not intended to diagnose MRSA infection nor to guide or monitor treatment for MRSA infections. Performed at [facility], [address], [place], [place] [address]     Radiology Reports DG Chest 1 View  Result Date: 08/14/2020 CLINICAL DATA:  Short of breath EXAM: CHEST  1 VIEW COMPARISON:  CT chest 08/03/2019 FINDINGS: Left chest wall port a catheter noted with tip in the distal SVC. Mild cardiac enlargement. Small bilateral pleural effusions and pulmonary vascular congestion identified. Chronic interstitial coarsening of COPD/emphysema noted. Diffuse sclerotic bone metastases are noted. IMPRESSION: 1. Cardiac enlargement and CHF. 2. Diffuse sclerotic bone metastases. Electronically Signed   By: Kerby Moors M.D.   On: 08/14/2020 13:05   NM Pulmonary Perfusion  Result Date: 08/14/2020 CLINICAL DATA:  Shortness of breath for 1 day EXAM: NUCLEAR MEDICINE PERFUSION LUNG SCAN TECHNIQUE: Perfusion images were obtained in multiple projections after intravenous  injection of radiopharmaceutical. Ventilation scans intentionally deferred if perfusion scan and chest x-ray adequate for interpretation during COVID 19 epidemic. RADIOPHARMACEUTICALS:  4.4 mCi Tc-93mMAA IV COMPARISON:  None Correlation: Chest radiograph 08/14/2020 FINDINGS: Thickening of the RIGHT fissures and minutes perfusion in LEFT lower lobe likely related to RIGHT pleural effusion. Artifact in LEFT upper lobe on LAO view, due to attenuation by patient's arm. Inhomogeneous perfusion throughout both lungs corresponding to infiltrates and RIGHT pleural effusion seen radiographically. No definite evidence of pulmonary embolism identified. IMPRESSION: Nonsegmental perfusion changes related to pleural effusion and BILATERAL pulmonary infiltrates. No definite evidence of pulmonary embolism. Electronically Signed   By: MLavonia DanaM.D.   On: 08/14/2020 16:53   [place]Venous Img Lower Bilateral (DVT)  Result Date: 08/14/2020 CLINICAL DATA:  Leg swelling and shortness of breath. EXAM: Bilateral LOWER EXTREMITY VENOUS DOPPLER ULTRASOUND TECHNIQUE: Gray-scale sonography with compression, as well as color and duplex ultrasound, were performed to evaluate the deep venous system(s) from the level of the common femoral vein through the popliteal and proximal calf veins. COMPARISON:  None FINDINGS: VENOUS Normal compressibility of the common femoral, superficial femoral, and popliteal veins, as well as the visualized calf veins. Visualized portions of profunda femoral vein and great saphenous vein unremarkable. No filling defects to suggest DVT on grayscale or color Doppler imaging. Doppler waveforms show normal direction of venous flow, normal respiratory plasticity and response to augmentation. OTHER None. Limitations: none IMPRESSION: Negative sonographic assessment for bilateral lower extremity DVT. Electronically Signed   By: GZetta BillsM.D.   On: 08/14/2020 18:02   ECHOCARDIOGRAM COMPLETE  Result Date:  08/14/2020    ECHOCARDIOGRAM REPORT   Patient Name:   Jeff BCleda ClarksDate of Exam: 08/14/2020 Medical Rec #:  [id number]      Height:       66.0 in Accession #:    [id number]     Weight:       152.6 lb Date of Birth:  9Feb 02, 1947      BSA:          1.783 m Patient Age:    89years        BP:           102/80 mmHg Patient Gender: M               HR:           107 bpm. Exam Location:  [facility] NaProcedure: 2D Echo Indications:    Dyspnea R06.00  History:        Patient has prior history of Echocardiogram examinations, most                 recent 10/26/2019. Stroke, Signs/Symptoms:Bacteremia; Risk                 Factors:Current Smoker. Cocaine Abuse, Sepsis, Acute Renal                 Failure.  Sonographer:    JLeavy CellaRDCS (AE) Referring Phys: [id number] Resort 1. Left ventricular ejection fraction, by estimation, is 10-15%. The left ventricle has severely decreased function. The left ventricle demonstrates global hypokinesis. The left ventricular internal cavity size was mildly dilated. Left ventricular diastolic parameters are consistent with Grade III diastolic dysfunction (restrictive). Elevated left atrial pressure.  2. Right ventricular systolic function moderately to severely reduced. The right ventricular size is mildly enlarged.  3. Left atrial size was moderately dilated.  4. Right atrial size was moderately dilated.  5. The pericardial effusion is circumferential. Moderate pleural effusion in the left lateral region.  6. MR is eccentric and may be underestimated. . The mitral valve is abnormal. Moderate mitral valve regurgitation. No evidence of mitral stenosis.  7. The tricuspid valve is abnormal. Tricuspid valve regurgitation is moderate.  8. The aortic valve is tricuspid. There is mild calcification of the aortic valve. There is mild thickening of the aortic valve. Aortic valve regurgitation is mild. No aortic stenosis is present.  9. Moderate to severe pulmoary HTN, PASP is  63 mmHg. 10. The inferior vena cava is dilated in size with <50% respiratory variability, suggesting right atrial pressure of 15 mmHg. FINDINGS  Left Ventricle: Left ventricular ejection fraction, by estimation, is 10-15%. The left ventricle has severely decreased function. The left ventricle demonstrates global hypokinesis. The left ventricular internal cavity size was mildly dilated. There is no left ventricular hypertrophy. Left ventricular diastolic parameters are consistent with Grade III diastolic dysfunction (restrictive). Elevated left atrial pressure. Right Ventricle: The right ventricular size is mildly enlarged. Right vetricular wall thickness was not assessed. Right ventricular systolic function moderately to severely reduced. Left Atrium: Left atrial size was moderately dilated. Right Atrium: Right atrial size was moderately dilated. Pericardium: Trivial pericardial effusion is present. The pericardial effusion is circumferential. Mitral Valve: MR is eccentric and may be underestimated. The mitral valve is abnormal. There is mild thickening of the mitral valve leaflet(s). There is mild calcification of the mitral valve leaflet(s). Mild mitral annular calcification. Moderate mitral  valve regurgitation. No evidence of mitral valve stenosis. Tricuspid Valve: The tricuspid valve is abnormal. Tricuspid valve regurgitation is moderate . No evidence of tricuspid stenosis. Aortic Valve: The aortic valve is tricuspid. There is mild calcification of the aortic valve. There is mild thickening of the aortic valve. There is mild aortic valve annular calcification. Aortic valve regurgitation is mild. No aortic stenosis is present. Aortic valve mean gradient measures 3.0 mmHg. Aortic valve peak gradient measures 5.9 mmHg. Pulmonic Valve: The pulmonic valve was not well visualized. Pulmonic valve regurgitation is mild. No evidence of pulmonic stenosis. Aorta: The aortic root is normal in size and structure. Pulmonary  Artery: Moderate to severe pulmoary HTN, PASP is 63 mmHg. Venous: The inferior vena cava is dilated in size with less than 50% respiratory variability, suggesting right atrial pressure of 15 mmHg. IAS/Shunts: No atrial level shunt detected by color flow Doppler. Additional Comments: There is a moderate pleural effusion in the left lateral region.  LEFT VENTRICLE PLAX 2D LVIDd:         5.91 cm  Diastology LVIDs:         5.61 cm  LV e' medial:    4.19 cm/s LV PW:         0.95 cm  LV E/e' medial:  29.4 LV IVS:        0.92 cm  LV e' lateral:   6.80 cm/s LVOT diam:     1.90 cm  LV E/e' lateral: 18.1 LVOT Area:     2.84 cm  RIGHT VENTRICLE RV S prime:     6.01 cm/s TAPSE (M-mode): 0.9 cm LEFT ATRIUM             Index       RIGHT ATRIUM           Index LA diam:        4.50 cm 2.52 cm/m  RA Area:     17.80 cm LA Vol (A2C):   77.9 ml 43.70 ml/m RA Volume:   56.30 ml  31.58 ml/m LA Vol (  A4C):   62.7 ml 35.17 ml/m LA Biplane Vol: 70.3 ml 39.44 ml/m  AORTIC VALVE AV Vmax:      121.62 cm/s AV Vmean:     81.192 cm/s AV VTI:       0.149 m AV Peak Grad: 5.9 mmHg AV Mean Grad: 3.0 mmHg  AORTA Ao Root diam: 3.10 cm MITRAL VALVE                TRICUSPID VALVE MV Area (PHT): 5.79 cm     TR Peak grad:   46.5 mmHg MV Decel Time: 131 msec     TR Vmax:        341.00 cm/s MR Peak grad: 84.3 mmHg MR Mean grad: 57.0 mmHg     SHUNTS MR Vmax:      459.00 cm/s   Systemic Diam: 1.90 cm MR Vmean:     350.0 cm/s MV E velocity: 123.00 cm/s MV A velocity: 50.30 cm/s MV E/A ratio:  2.45 Carlyle Dolly MD Electronically signed by Carlyle Dolly MD Signature Date/Time: 08/14/2020/3:38:27 PM    Final      CBC Recent Labs  Lab 08/14/20 1208 08/15/20 0410  WBC 9.3 11.6*  HGB 11.6* 11.4*  HCT 35.7* 35.2*  PLT 205 194  MCV 97.0 93.6  MCH 31.5 30.3  MCHC 32.5 32.4  RDW 18.9* 18.8*  LYMPHSABS 1.5  --   MONOABS 0.8  --   EOSABS 0.0  --   BASOSABS 0.0  --     Chemistries  Recent Labs  Lab 08/14/20 1208 08/15/20 0410  NA 136  138  K 3.6 3.8  CL 104 106  CO2 12* 16*  GLUCOSE 106* 134*  BUN 53* 61*  CREATININE 2.68* 2.60*  CALCIUM 8.0* 8.2*  MG  --  2.0  AST 106* 182*  ALT 66* 111*  ALKPHOS 182* 227*  BILITOT 2.5* 2.0*   ------------------------------------------------------------------------------------------------------------------ No results for input(s): CHOL, HDL, LDLCALC, TRIG, CHOLHDL, LDLDIRECT in the last 72 hours.  Lab Results  Component Value Date   HGBA1C 6.3 (H) 10/25/2019   ------------------------------------------------------------------------------------------------------------------ No results for input(s): TSH, T4TOTAL, T3FREE, THYROIDAB in the last 72 hours.  Invalid input(s): FREET3 ------------------------------------------------------------------------------------------------------------------ No results for input(s): VITAMINB12, FOLATE, FERRITIN, TIBC, IRON, RETICCTPCT in the last 72 hours.  Coagulation profile Recent Labs  Lab 08/14/20 1208  INR 1.6*    No results for input(s): DDIMER in the last 72 hours.  Cardiac Enzymes No results for input(s): CKMB, TROPONINI, MYOGLOBIN in the last 168 hours.  Invalid input(s): CK ------------------------------------------------------------------------------------------------------------------    Component Value Date/Time   BNP >4,500.0 (H) 08/14/2020 3435     Roxan Hockey M.D on 08/15/2020 at 5:06 PM  Go to [email or web address] - for contact info  [facility] - Office  [phone]

## 2020-08-15 NOTE — Plan of Care (Signed)

## 2020-08-15 NOTE — Progress Notes (Signed)
NAME:  Jeff [name], MRN:  [id number], DOB:  1945-10-22, LOS: 1 ADMISSION DATE:  08/14/2020, CONSULTATION DATE:  08/14/20 REFERRING MD:  Dr Eulis Foster, CHIEF COMPLAINT: sob/ cardiogenic shock   History of Present Illness:  64 yobm smoker  currently being treated for prostate cancer with mets to the bone with oral chemotherapy.  He last had an infusion on 4-14. Admit to ER am 4/21 with onset sob w/in last 12 hours prior to arrival s cp or cough.  He denies sick contacts.  He does not wear oxygen at home.  No history of lung problems such as COPD or asthma.  He is not on any blood thinners, no previous history of PE.  Additional history of alcohol and cocaine use, chf last echo 35%.  He is non-compliant with cardiology clinic follow up   Pertinent  Medical History  Acute renal failure ([place]) (01/26/2014), Alcohol abuse, Anemia of chronic disease (01/26/2014), Bilateral hydronephrosis (01/26/2014), History of cocaine abuse ([place]) (01/26/2014), Homelessness (01/31/2014), Prostate cancer metastatic to multiple sites [facility]) (01/30/2014), Sepsis ([place]), and UTI (lower urinary tract infection).   Significant Hospital Events: Including procedures, antibiotic start and stop dates in addition to other pertinent events   . ech 4/21  EF 03%, G III diastolic dysfunction / mod LA RA enlargement, PAS est 63 and RA 15  . Q scan 4/21 >>> low probability  . Venous dopplers 4/21 >>> Negative bilaterally . BC x 2 4/21 >>> pending  Imaging CXR 08/08/2020 Right IJ approach porta cath removal and left subclavian approach Port-A-Cath placement with no adverse features. Chronic pulmonary fibrosis, mild cardiomegaly, diffuse sclerotic bone metastases.   Scheduled Meds: . aspirin EC  81 mg Oral Daily  . Chlorhexidine Gluconate Cloth  6 each Topical Daily   Continuous Infusions: . ceFEPime (MAXIPIME) IV Stopped (08/14/20 2008)  . heparin 1,200 Units/hr (08/15/20 0758)  . metronidazole 500 mg (08/15/20 1005)  . milrinone  0.125 mcg/kg/min (08/15/20 1123)  . [START ON 08/16/2020] vancomycin     PRN Meds:.   Interim History / Subjective:  T max 98.9 Creatinine 2.6, BUN 61, Calcium 8.2, K 3.8, Mag 2.0 Lactate from 7.9 to 3.4 last 24 hours PCT 3.36 on 4/22 ( up from 1.98) Alk Phos 227, AST 182, ALT 111 Troponin 259 BNP > 4500 Co-ox low of 23% Tachy at [facility] this morning with HR up to 130's Concerning for atrial fibrillation at times but p-waves are evident with different morphologies, more consistent with MAT. Mg at 2.0 this AM and K+ at 3.8. K repleted  Objective   Blood pressure (!) 143/111, pulse (!) 163, temperature 98.3 F (36.8 C), temperature source Oral, resp. rate 15, height 6' (1.829 m), weight 65.7 kg, SpO2 97 %.        Intake/Output Summary (Last 24 hours) at 08/15/2020 1437 Last data filed at 08/15/2020 0716 Gross per 24 hour  Intake 913.98 ml  Output 700 ml  Net 213.98 ml   Filed Weights   08/14/20 2245 08/15/20 0402  Weight: 65.7 kg 65.7 kg   Physical Exam General: Chronically ill, thin and frail appearing elderly M, reclined in bed NAD  HEENT: NCAT temporal muscle wasting.  Lungs: CTA symmetrical chest expansion, even and unlabored on RA  Cardiovascular: irir s1s2. Ejection murmur. 1+ radial pulse. No JVD  Abdomen: thin, flat ndnt  GU: Condom cath  Extremities: No acute joint deformity, no cyanosis or clubbing. Symmetrically decreased muscle bulk and tone  Neuro: Awake Alert. No focal deficit Psych:  Calm, pleasant   Assessment & Plan:    Acute respiratory failure with hypoxia - improving  - likely decompensated chf P - CXR prn -doesn't appear or sound significantly volume overloaded. Will d/w Adv HF before further diuresis  - Titrate oxygen to maintain sats > 92% - DO NOT INTUBATE, BiPAP if needed  Cardiogenic shock  Acute on chronic systolic and diastolic heart failure -LVEF 10% , G3DD, elevated PAS  -Troponin 259, BNP > 4500 -cant r/o septic component with elevated  PCT but less likely (afeble, marginal leukocytosis- though is immunocomp host on chemo). No evidence of PE by vq, no dvt  P -cardiology following at [facility], will notify Adv HF at [facility] -Milrinone ( Dobutamine discontinuned) - IV heparin gtt per cards -off IV lasix -- defer further diuresis to Adv HF  - recheck coox ldh la on arrival to [facility]  - Strict I&O - Broad-spectrum antibiotics-IV Vanco, cefepime and metronidazole started 4/21-- low threshold to dc if cx without source or follow up labs reassuring   Tachycardia: MAT v Afib, SVT -Not on BB or Amio 2/2 elevated LFT's -no dig 2/2 AKI  -favored to be MAT  P -Per Cards -electrolyte optimization -ICU monitoring  AKI on CKD  Lab Results  Component Value Date   CREATININE 2.60 (H) 08/15/2020   CREATININE 2.68 (H) 08/14/2020   CREATININE 1.66 (H) 08/07/2020    P - trend I/Os, renal indices -MAP >65 -minimize nephrotoxic meds   Metastatic  prostate cancer  -bone mets and retroperitoneal adenopathy -s/p bilateral orchiectomy 2015, currently on chemotherapy. -prognosis is probably very poor and would weigh that out prior to escalation of cardiac intervention  P  - Will need to follow calcium levels - Last PSA was 0.21, slightly improved from prior value of 0.23. - Palliative consult ordered - Pt. Is a DNR  Elevated LFT's / Alk Phos -Likely due to hepatic congestion/cardiogenic shock P -Trend -Stop Statin  Lactic Acidosis -in setting of shock P -low utility in trending further at present   Normocytic anemia -Combination anemia from CKD and relative iron deficiency.  P -consider ferritin and iron panel OP, inpt if becomes indicated  -trend, goal >8   Hx. Of CVA: -Continue heparin gtt and hold lipitor for now ( LFT's).  History of Substance Abuse  - Prior UDS positive for cocaine but negative this admission. Denies any recent use ( Last + 10/26/2019)    Labs   CBC: Recent Labs  Lab 08/14/20 1208 08/15/20 0410  WBC  9.3 11.6*  NEUTROABS 6.9  --   HGB 11.6* 11.4*  HCT 35.7* 35.2*  MCV 97.0 93.6  PLT 205 130    Basic Metabolic Panel: Recent Labs  Lab 08/14/20 1208 08/15/20 0410  NA 136 138  K 3.6 3.8  CL 104 106  CO2 12* 16*  GLUCOSE 106* 134*  BUN 53* 61*  CREATININE 2.68* 2.60*  CALCIUM 8.0* 8.2*  MG  --  2.0   GFR: Estimated Creatinine Clearance: 23.5 mL/min (A) (by C-G formula based on SCr of 2.6 mg/dL (H)). Recent Labs  Lab 08/14/20 1208 08/14/20 1326 08/14/20 1535 08/15/20 0410  PROCALCITON  --   --  1.98 3.36  WBC 9.3  --   --  11.6*  LATICACIDVEN  --  7.9* 7.9* 3.4*    Liver Function Tests: Recent Labs  Lab 08/14/20 1208 08/15/20 0410  AST 106* 182*  ALT 66* 111*  ALKPHOS 182* 227*  BILITOT 2.5* 2.0*  PROT  7.8 7.5  ALBUMIN 3.6 3.4*   No results for input(s): LIPASE, AMYLASE in the last 168 hours. No results for input(s): AMMONIA in the last 168 hours.  ABG    Component Value Date/Time   PHART 7.354 08/14/2020 1525   PCO2ART 28.0 (L) 08/14/2020 1525   PO2ART <31.0 (LL) 08/14/2020 1525   HCO3 16.4 (L) 08/14/2020 1525   TCO2 21 (L) 10/25/2019 1426   ACIDBASEDEF 9.2 (H) 08/14/2020 1525   O2SAT 63.4 08/15/2020 0755     Coagulation Profile: Recent Labs  Lab 08/14/20 1208  INR 1.6*    Cardiac Enzymes: No results for input(s): CKTOTAL, CKMB, CKMBINDEX, TROPONINI in the last 168 hours.  HbA1C: Hemoglobin A1C  Date/Time Value Ref Range Status  02/06/2014 10:26 AM 5.9  Final   Hgb A1c MFr Bld  Date/Time Value Ref Range Status  10/25/2019 02:41 PM 6.3 (H) 4.8 - 5.6 % Final    Comment:    (NOTE) Pre diabetes:          5.7%-6.4%  Diabetes:              >6.4%  Glycemic control for   <7.0% adults with diabetes     CBG: No results for input(s): GLUCAP in the last 168 hours.    Best Practice    Code Status: DNR Goals of Care meeting: pending DVT: heparin gtt L/T/D: has a port. Condom cath  VAP : n/a PAD: n/a GI: n/a Diet: clears     CRITICAL CARE Performed by: Cristal Generous   Total critical care time: 42  minutes  Critical care time was exclusive of separately billable procedures and treating other patients. Critical care was necessary to treat or prevent imminent or life-threatening deterioration.  Critical care was time spent personally by me on the following activities: development of treatment plan with patient and/or surrogate as well as nursing, discussions with consultants, evaluation of patient's response to treatment, examination of patient, obtaining history from patient or surrogate, ordering and performing treatments and interventions, ordering and review of laboratory studies, ordering and review of radiographic studies, pulse oximetry and re-evaluation of patient's condition.  Eliseo Gum MSN, AGACNP-BC [place] for Pager 08/15/2020, 8:05 PM

## 2020-08-15 NOTE — Progress Notes (Addendum)
Progress Note  Patient Name: Jeff [name] Date of Encounter: 08/15/2020  [facility] Cardiologist: Carlyle Dolly, MD   Subjective   He says his breathing has improved but still with orthopnea. No chest pain or palpitations overnight or this morning. Denies any recent abdominal distension or lower extremity edema.   Inpatient Medications    Scheduled Meds: . aspirin EC  81 mg Oral Daily  . Chlorhexidine Gluconate Cloth  6 each Topical Daily  . potassium chloride  40 mEq Oral Once   Continuous Infusions: . ceFEPime (MAXIPIME) IV Stopped (08/14/20 2008)  . heparin 1,200 Units/hr (08/15/20 0758)  . metronidazole 500 mg (08/15/20 1005)  . milrinone    . [START ON 08/16/2020] vancomycin     PRN Meds: acetaminophen **OR** acetaminophen, ondansetron **OR** ondansetron (ZOFRAN) IV, polyethylene glycol   Vital Signs    Vitals:   08/15/20 1000 08/15/20 1015 08/15/20 1030 08/15/20 1045  BP: 94/72 99/64 94/67  (!) 143/111  Pulse: (!) 48 (!) 148 (!) 153 (!) 163  Resp: 17 20 19 15   Temp:      TempSrc:      SpO2: 97% 96% 92% 97%  Weight:      Height:        Intake/Output Summary (Last 24 hours) at 08/15/2020 1109 Last data filed at 08/15/2020 0716 Gross per 24 hour  Intake 957.82 ml  Output 700 ml  Net 257.82 ml   Last 3 Weights 08/15/2020 08/14/2020 08/07/2020  Weight (lbs) 144 lb 13.5 oz 144 lb 13.5 oz 152 lb 9.6 oz  Weight (kg) 65.7 kg 65.7 kg 69.219 kg      Telemetry    Sinus tachycardia, HR in 120's to 130's. Difficult to distinguish p-waves at times and he does have variable p-wave morphology on telemetry consistent with MAT. Frequent PAC's and couplets. Short runs of NSVT up to 5 beats.  - Personally Reviewed  ECG    Sinus tachycardia, HR 135 with inferior infarct pattern and PVC's. - Personally Reviewed  Physical Exam   GEN: Thin male appearing in no acute distress.   Neck: JVD at 10 cm.  Cardiac: Regular rhythm with occasional ectopic beats.  Tachycardiac rate. 2/6 SEM along [place].  Respiratory: Rales along bases bilaterally.  GI: Soft, nontender, non-distended  MS: No edema; No deformity. Neuro:  Nonfocal  Psych: Normal affect   Labs    High Sensitivity Troponin:   Recent Labs  Lab 08/14/20 1208 08/14/20 1425 08/15/20 0434  TROPONINIHS 1,279* 199* 259*      Chemistry Recent Labs  Lab 08/14/20 1208 08/15/20 0410  NA 136 138  K 3.6 3.8  CL 104 106  CO2 12* 16*  GLUCOSE 106* 134*  BUN 53* 61*  CREATININE 2.68* 2.60*  CALCIUM 8.0* 8.2*  PROT 7.8 7.5  ALBUMIN 3.6 3.4*  AST 106* 182*  ALT 66* 111*  ALKPHOS 182* 227*  BILITOT 2.5* 2.0*  GFRNONAA 24* 25*  ANIONGAP 20* 16*     Hematology Recent Labs  Lab 08/14/20 1208 08/15/20 0410  WBC 9.3 11.6*  RBC 3.68* 3.76*  HGB 11.6* 11.4*  HCT 35.7* 35.2*  MCV 97.0 93.6  MCH 31.5 30.3  MCHC 32.5 32.4  RDW 18.9* 18.8*  PLT 205 194    BNP Recent Labs  Lab 08/14/20 1208  BNP >4,500.0*     Radiology    DG Chest 1 View  Result Date: 08/14/2020 CLINICAL DATA:  Short of breath EXAM: CHEST  1 VIEW COMPARISON:  CT chest  08/03/2019 FINDINGS: Left chest wall port a catheter noted with tip in the distal SVC. Mild cardiac enlargement. Small bilateral pleural effusions and pulmonary vascular congestion identified. Chronic interstitial coarsening of COPD/emphysema noted. Diffuse sclerotic bone metastases are noted. IMPRESSION: 1. Cardiac enlargement and CHF. 2. Diffuse sclerotic bone metastases. Electronically Signed   By: Kerby Moors M.D.   On: 08/14/2020 13:05   NM Pulmonary Perfusion  Result Date: 08/14/2020 CLINICAL DATA:  Shortness of breath for 1 day EXAM: NUCLEAR MEDICINE PERFUSION LUNG SCAN TECHNIQUE: Perfusion images were obtained in multiple projections after intravenous injection of radiopharmaceutical. Ventilation scans intentionally deferred if perfusion scan and chest x-ray adequate for interpretation during COVID 19 epidemic. RADIOPHARMACEUTICALS:   4.4 mCi Tc-24m MAA IV COMPARISON:  None Correlation: Chest radiograph 08/14/2020 FINDINGS: Thickening of the RIGHT fissures and minutes perfusion in LEFT lower lobe likely related to RIGHT pleural effusion. Artifact in LEFT upper lobe on LAO view, due to attenuation by patient's arm. Inhomogeneous perfusion throughout both lungs corresponding to infiltrates and RIGHT pleural effusion seen radiographically. No definite evidence of pulmonary embolism identified. IMPRESSION: Nonsegmental perfusion changes related to pleural effusion and BILATERAL pulmonary infiltrates. No definite evidence of pulmonary embolism. Electronically Signed   By: Lavonia Dana M.D.   On: 08/14/2020 16:53   US Venous Img Lower Bilateral (DVT)  Result Date: 08/14/2020 CLINICAL DATA:  Leg swelling and shortness of breath. EXAM: Bilateral LOWER EXTREMITY VENOUS DOPPLER ULTRASOUND TECHNIQUE: Gray-scale sonography with compression, as well as color and duplex ultrasound, were performed to evaluate the deep venous system(s) from the level of the common femoral vein through the popliteal and proximal calf veins. COMPARISON:  None FINDINGS: VENOUS Normal compressibility of the common femoral, superficial femoral, and popliteal veins, as well as the visualized calf veins. Visualized portions of profunda femoral vein and great saphenous vein unremarkable. No filling defects to suggest DVT on grayscale or color Doppler imaging. Doppler waveforms show normal direction of venous flow, normal respiratory plasticity and response to augmentation. OTHER None. Limitations: none IMPRESSION: Negative sonographic assessment for bilateral lower extremity DVT. Electronically Signed   By: Zetta Bills M.D.   On: 08/14/2020 18:02   Cardiac Studies   Echocardiogram: 08/14/2020 IMPRESSIONS   1. Left ventricular ejection fraction, by estimation, is 10-15%. The left  ventricle has severely decreased function. The left ventricle demonstrates  global  hypokinesis. The left ventricular internal cavity size was mildly  dilated. Left ventricular  diastolic parameters are consistent with Grade III diastolic dysfunction  (restrictive). Elevated left atrial pressure.  2. Right ventricular systolic function moderately to severely reduced.  The right ventricular size is mildly enlarged.  3. Left atrial size was moderately dilated.  4. Right atrial size was moderately dilated.  5. The pericardial effusion is circumferential. Moderate pleural effusion  in the left lateral region.  6. MR is eccentric and may be underestimated. . The mitral valve is  abnormal. Moderate mitral valve regurgitation. No evidence of mitral  stenosis.  7. The tricuspid valve is abnormal. Tricuspid valve regurgitation is  moderate.  8. The aortic valve is tricuspid. There is mild calcification of the  aortic valve. There is mild thickening of the aortic valve. Aortic valve  regurgitation is mild. No aortic stenosis is present.  9. 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  10. The inferior vena cava is dilated in size with <50% respiratory  variability, suggesting right atrial pressure of 15 mmHg.   Patient Profile     75  y.o. male w/ PMH of chronic systolic CHF (EF 09% by echo in 10/2019), history of CVA, Stage 3 CKD, metastatic prostate cancer with bone bets and polysubstance abuse (UDS positive for cocaine in 10/2019 and negative this admission) who is currently admitted for an acute CHF exacerbation.   Assessment & Plan    1. Acute on Chronic Combined Systolic and Diastolic CHF/Biventricular Failure - Presented with worsening dyspnea on exertion and orthopnea for 1 week. BNP > 4500 on admission and repeat echo shows a significantly reduced EF of 10-15% with global HK, Grade 2 DD, moderately to severely reduced RV function, trivial pericardial effusion, at least moderate MR, moderate TR, and moderate to severe pulmonary HTN with PASP at 63 mmHg.  -  Lactic Acid at 7.9 on admission, improved to 3.4 today. Co-ox was at 23 on admission and he was started on low-dose Dobutamine (currently at 2 mcg/kg/min) with repeat co-ox at 63 today. Will review with Dr. Domenic Polite in regards to starting diuretic therapy. Discussed with the admitting team and will plan for transfer [facility] given no cardiology coverage at [facility] over the weekend. Would recommend evaluation by Advanced Heart Failure once at [facility]. Eventual R +/- LHC (pending renal function). No BB at this time given his hypotension. He has been consistently tachycardiac but Amiodarone but ideal given his elevated LFT's and no Digoxin due to his AKI. No ACE-I/ARB/ARNI/Spiro due to hypotension and AKI.   ADDENDUM: Reviewed with Dr. Domenic Polite and will stop Dobutamine and switch to Milrinone. Will start at 0.125 mcg/kg/min.   2. Elevated Troponin  - Hs Troponin 1279 on admission with subsequent values lower at 199 and 259. Echo shows a significantly reduced EF of 10-15% as outlined above. No plans for LHC at this time given his AKI but can consider prior to d/c pending trend of his renal function.  - Remains on ASA 81mg  daily and Heparin. No BB due to hypotension and statin held given elevated LFT's.   3. Tachycardia - HR variable from the 90's to 140's on telemetry. Concerning for atrial fibrillation at times but p-waves are evident with different morphologies, more consistent with MAT. Mg at 2.0 this AM and K+ at 3.8. Will order additional K+ supplementation to keep this ~ 4.0 given his tachycardia and ectopy.  -  Not on a BB due to hypotension and Amiodarone is not ideal given his elevated LFT's but may need to consider temporary use given his persistent tachycardia.   4. Elevated Lactic Acid/Metabolic Acidosis - Lactic Acid at 7.9 on admission, improved to 3.4 today. WBC trending up from 9.3 to 11.6. Remains on broad-spectrum antibiotics with Vancomycin and Cefepime.   5. Metastatic Prostate  Cancer with Mets to Bones and Retroperitoneal Adenopathy - Followed by Dr. Delton Coombes as an outpatient and remains on Abiraterone 1000mg  daily. By review of their notes from 08/07/2020, his labs have been stable and have actually improved. Would recommend reviewing overall prognosis with Oncology but at this time the patient wants treatment for his cardiomyopathy but is DNR.   6. Acute on Chronic Stage 3 CKD - Baseline creatinine 1.5 - 1.6, elevated to 2.68 on admission. Stable at 2.60 today.   7. Elevated LFT's - AST 182 and ALT 111 this AM. Likely due to hepatic congestion/cardiogenic shock. Continue to follow. Statin held.   8. Prior CVA - He has been continued on ASA 81mg  daily. PTA Atorvastatin 40mg  held given elevated LFT's.   9. History of Substance Abuse -  Prior UDS positive for cocaine but negative this admission. Denies any recent use.    For questions or updates, please contact [place] Please consult [email or web address] for contact info under     Signed, Erma Heritage, PA-C  08/15/2020, 11:09 AM     Attending note:  Patient seen and examined.  I reviewed his records and discussed the case with Ms. Ahmed Prima PA-C as well as Dr. Denton Brick.  Patient was evaluated in consultation by Dr. Harl Bowie yesterday presenting with cardiogenic shock (possibly mixed picture however with elevated procalcitonin) in the setting of cardiomyopathy with biventricular heart failure, LVEF 10 to 15%, PASP 63 mmHg, lactic acid 7.9 at presentation and reportedly co-ox 23.  This is in the setting of complex comorbidities including metastatic prostate cancer followed by Dr. Delton Coombes on Abiraterone, CKD stage IIIb, previous stroke, and previous cocaine abuse (present UDS negative).   On examination this morning he states that his breathing is less labored, still feels orthopneic in general, no chest pain.  Telemetry shows sinus rhythm with episodes of MAT and probable atrial fibrillation on IV dobutamine  which was started yesterday.  Systolic blood pressure ranging 90 to 110 recently.  Port-A-Cath in place.  Lungs exhibit decreased breath sounds with crackles at the bases, cardiac exam with irregular rhythm and indistinct PMI with 2/6 apical systolic murmur.  No peripheral edema.  Pertinent lab work this morning include co-ox up to 63, potassium 3.8, BUN 61, creatinine 2.6, AST up to 182, ALT up to 111, high-sensitivity troponin I 259, lactic acid down to 3.4, procalcitonin 3.36, UA with trace leukocytes and few bacteria, UDS negative, urine culture pending, blood culture pending.  Chest x-ray shows cardiomegaly with small pleural effusions and vascular congestion, also emphysematous changes and evidence of sclerotic bone metastasis.  Recent ECG shows sinus tachycardia and SVT into the 170s with leftward axis, PVCs, rule out old inferolateral infarct pattern.  Per discussion with hospitalist service, transfer is being arranged to ICU at [facility].  Would transition from IV dobutamine to IV milrinone 0.125 mcg/kg/min.  If blood pressure needs further support would start low-dose Levophed.  He is on broad-spectrum antibiotics empirically given overall mixed picture and possibility of sepsis, although is afebrile at this time without obvious source.  Follow urine output and renal function, may be able to eventually start IV diuretic once hemodynamics stabilize.  Encouraging that his co-ox apparently improved so quickly.  Would ask the advanced heart failure team to assist with care of the patient, although at this point it is not clear that he is a candidate for advanced strategies such as mechanical support, currently not a candidate for right and left heart catheterization.  If rhythm does not further stabilize following transition off dobutamine, would consider temporary course of IV amiodarone.  Satira Sark, M.D., F.A.C.C.

## 2020-08-16 ENCOUNTER — Other Ambulatory Visit: Payer: Self-pay

## 2020-08-16 DIAGNOSIS — I5043 Acute on chronic combined systolic (congestive) and diastolic (congestive) heart failure: Secondary | ICD-10-CM | POA: Diagnosis not present

## 2020-08-16 DIAGNOSIS — I509 Heart failure, unspecified: Secondary | ICD-10-CM

## 2020-08-16 DIAGNOSIS — N183 Chronic kidney disease, stage 3 unspecified: Secondary | ICD-10-CM

## 2020-08-16 LAB — URINE CULTURE: Culture: NO GROWTH

## 2020-08-16 LAB — BASIC METABOLIC PANEL
Anion gap: 10 (ref 5–15)
Anion gap: 10 (ref 5–15)
BUN: 45 mg/dL — ABNORMAL HIGH (ref 8–23)
BUN: 44 mg/dL — ABNORMAL HIGH (ref 8–23)
CO2: 20 mmol/L — ABNORMAL LOW (ref 22–32)
CO2: 19 mmol/L — ABNORMAL LOW (ref 22–32)
Calcium: 7.3 mg/dL — ABNORMAL LOW (ref 8.9–10.3)
Calcium: 7.4 mg/dL — ABNORMAL LOW (ref 8.9–10.3)
Chloride: 107 mmol/L (ref 98–111)
Chloride: 109 mmol/L (ref 98–111)
Creatinine, Ser: 2.12 mg/dL — ABNORMAL HIGH (ref 0.61–1.24)
Creatinine, Ser: 2.28 mg/dL — ABNORMAL HIGH (ref 0.61–1.24)
GFR, Estimated: 32 mL/min — ABNORMAL LOW (ref >60)
GFR, Estimated: 30 mL/min — ABNORMAL LOW (ref >60)
Glucose, Bld: 117 mg/dL — ABNORMAL HIGH (ref 70–99)
Glucose, Bld: 133 mg/dL — ABNORMAL HIGH (ref 70–99)
Potassium: 2.9 mmol/L — ABNORMAL LOW (ref 3.5–5.1)
Potassium: 4 mmol/L (ref 3.5–5.1)
Sodium: 137 mmol/L (ref 135–145)
Sodium: 138 mmol/L (ref 135–145)

## 2020-08-16 LAB — COOXEMETRY PANEL
Carboxyhemoglobin: 0.9 % (ref 0.5–1.5)
Carboxyhemoglobin: 0.9 % (ref 0.5–1.5)
Carboxyhemoglobin: 0.7 % (ref 0.5–1.5)
Carboxyhemoglobin: 1.2 % (ref 0.5–1.5)
Carboxyhemoglobin: 1 % (ref 0.5–1.5)
Methemoglobin: 0.7 % (ref 0.0–1.5)
Methemoglobin: 0.8 % (ref 0.0–1.5)
Methemoglobin: 0.8 % (ref 0.0–1.5)
Methemoglobin: 0.9 % (ref 0.0–1.5)
Methemoglobin: 0.8 % (ref 0.0–1.5)
O2 Saturation: 39 %
O2 Saturation: 34.3 %
O2 Saturation: 33.7 %
O2 Saturation: 52.4 %
O2 Saturation: 40.4 %
Total hemoglobin: 11.4 g/dL — ABNORMAL LOW (ref 12.0–16.0)
Total hemoglobin: 11.2 g/dL — ABNORMAL LOW (ref 12.0–16.0)
Total hemoglobin: 10.4 g/dL — ABNORMAL LOW (ref 12.0–16.0)
Total hemoglobin: 10.2 g/dL — ABNORMAL LOW (ref 12.0–16.0)
Total hemoglobin: 11.2 g/dL — ABNORMAL LOW (ref 12.0–16.0)

## 2020-08-16 LAB — CBC
HCT: 32.3 % — ABNORMAL LOW (ref 39.0–52.0)
HCT: 32.8 % — ABNORMAL LOW (ref 39.0–52.0)
Hemoglobin: 10.6 g/dL — ABNORMAL LOW (ref 13.0–17.0)
Hemoglobin: 10.6 g/dL — ABNORMAL LOW (ref 13.0–17.0)
MCH: 30.8 pg (ref 26.0–34.0)
MCH: 30.5 pg (ref 26.0–34.0)
MCHC: 32.8 g/dL (ref 30.0–36.0)
MCHC: 32.3 g/dL (ref 30.0–36.0)
MCV: 93.9 fL (ref 80.0–100.0)
MCV: 94.3 fL (ref 80.0–100.0)
Platelets: 194 10*3/uL (ref 150–400)
Platelets: 185 10*3/uL (ref 150–400)
RBC: 3.44 MIL/uL — ABNORMAL LOW (ref 4.22–5.81)
RBC: 3.48 MIL/uL — ABNORMAL LOW (ref 4.22–5.81)
RDW: 19.8 % — ABNORMAL HIGH (ref 11.5–15.5)
RDW: 19.5 % — ABNORMAL HIGH (ref 11.5–15.5)
WBC: 8.8 10*3/uL (ref 4.0–10.5)
WBC: 8.1 10*3/uL (ref 4.0–10.5)
nRBC: 1.9 % — ABNORMAL HIGH (ref 0.0–0.2)
nRBC: 3.7 % — ABNORMAL HIGH (ref 0.0–0.2)

## 2020-08-16 LAB — COMPREHENSIVE METABOLIC PANEL
ALT: 139 U/L — ABNORMAL HIGH (ref 0–44)
AST: 177 U/L — ABNORMAL HIGH (ref 15–41)
Albumin: 2.9 g/dL — ABNORMAL LOW (ref 3.5–5.0)
Alkaline Phosphatase: 182 U/L — ABNORMAL HIGH (ref 38–126)
Anion gap: 9 (ref 5–15)
BUN: 44 mg/dL — ABNORMAL HIGH (ref 8–23)
CO2: 22 mmol/L (ref 22–32)
Calcium: 7.4 mg/dL — ABNORMAL LOW (ref 8.9–10.3)
Chloride: 107 mmol/L (ref 98–111)
Creatinine, Ser: 2.15 mg/dL — ABNORMAL HIGH (ref 0.61–1.24)
GFR, Estimated: 32 mL/min — ABNORMAL LOW (ref >60)
Glucose, Bld: 109 mg/dL — ABNORMAL HIGH (ref 70–99)
Potassium: 3.2 mmol/L — ABNORMAL LOW (ref 3.5–5.1)
Sodium: 138 mmol/L (ref 135–145)
Total Bilirubin: 1.4 mg/dL — ABNORMAL HIGH (ref 0.3–1.2)
Total Protein: 6.7 g/dL (ref 6.5–8.1)

## 2020-08-16 LAB — HEPARIN LEVEL (UNFRACTIONATED)
Heparin Unfractionated: 0.32 IU/mL (ref 0.30–0.70)
Heparin Unfractionated: 0.38 IU/mL (ref 0.30–0.70)

## 2020-08-16 LAB — LACTIC ACID, PLASMA: Lactic Acid, Venous: 1.6 mmol/L (ref 0.5–1.9)

## 2020-08-16 LAB — LACTATE DEHYDROGENASE: LDH: 394 U/L — ABNORMAL HIGH (ref 98–192)

## 2020-08-16 LAB — MRSA PCR SCREENING: MRSA by PCR: NEGATIVE

## 2020-08-16 LAB — PROCALCITONIN: Procalcitonin: 2.41 ng/mL

## 2020-08-16 MED ORDER — POTASSIUM CHLORIDE CRYS ER 20 MEQ PO TBCR
30.0000 meq | EXTENDED_RELEASE_TABLET | ORAL | Status: DC
  Administered 2020-08-16: 30 meq via ORAL
  Filled 2020-08-16: qty 1

## 2020-08-16 MED ORDER — FUROSEMIDE 10 MG/ML IJ SOLN
80.0000 mg | Freq: Two times a day (BID) | INTRAMUSCULAR | Status: DC
  Administered 2020-08-16: 80 mg via INTRAVENOUS
  Filled 2020-08-16: qty 8

## 2020-08-16 MED ORDER — POTASSIUM CHLORIDE 20 MEQ PO PACK
40.0000 meq | PACK | Freq: Two times a day (BID) | ORAL | Status: AC
  Administered 2020-08-16: 40 meq via ORAL
  Filled 2020-08-16: qty 2

## 2020-08-16 MED ORDER — POTASSIUM CHLORIDE CRYS ER 20 MEQ PO TBCR
40.0000 meq | EXTENDED_RELEASE_TABLET | Freq: Two times a day (BID) | ORAL | Status: DC
  Administered 2020-08-16: 40 meq via ORAL
  Filled 2020-08-16: qty 2

## 2020-08-16 MED ORDER — NOREPINEPHRINE 4 MG/250ML-% IV SOLN
3.0000 ug/min | INTRAVENOUS | Status: DC
  Administered 2020-08-16: 3 ug/min via INTRAVENOUS
  Administered 2020-08-17: 2 ug/min via INTRAVENOUS
  Filled 2020-08-16 (×2): qty 250

## 2020-08-16 MED ORDER — HEPARIN SODIUM (PORCINE) 5000 UNIT/ML IJ SOLN
5000.0000 [IU] | Freq: Three times a day (TID) | INTRAMUSCULAR | Status: DC
  Administered 2020-08-16 – 2020-08-18 (×7): 5000 [IU] via SUBCUTANEOUS
  Filled 2020-08-16 (×7): qty 1

## 2020-08-16 MED ORDER — MILRINONE LACTATE IN DEXTROSE 20-5 MG/100ML-% IV SOLN
0.3750 ug/kg/min | INTRAVENOUS | Status: DC
  Administered 2020-08-17 – 2020-08-18 (×3): 0.375 ug/kg/min via INTRAVENOUS
  Filled 2020-08-16 (×2): qty 100

## 2020-08-16 MED ORDER — POTASSIUM CHLORIDE 10 MEQ/50ML IV SOLN
10.0000 meq | INTRAVENOUS | Status: AC
  Administered 2020-08-16 (×6): 10 meq via INTRAVENOUS
  Filled 2020-08-16 (×6): qty 50

## 2020-08-16 MED ORDER — AMIODARONE HCL IN DEXTROSE 360-4.14 MG/200ML-% IV SOLN
60.0000 mg/h | INTRAVENOUS | Status: DC
  Administered 2020-08-16: 60 mg/h via INTRAVENOUS
  Administered 2020-08-16: 30 mg/h via INTRAVENOUS
  Administered 2020-08-17 – 2020-08-18 (×5): 60 mg/h via INTRAVENOUS
  Filled 2020-08-16 (×6): qty 200

## 2020-08-16 MED ORDER — AMIODARONE LOAD VIA INFUSION
150.0000 mg | Freq: Once | INTRAVENOUS | Status: AC
  Administered 2020-08-16: 150 mg via INTRAVENOUS
  Filled 2020-08-16: qty 83.34

## 2020-08-16 MED ORDER — POTASSIUM CHLORIDE 10 MEQ/50ML IV SOLN: 10.0000 meq | INTRAVENOUS | Status: DC

## 2020-08-16 NOTE — Progress Notes (Signed)
[facility] Progress Note Patient Name: Jeff [name] DOB: Dec 27, 1945 MRN: [id number]   Date of Service  08/16/2020  HPI/Events of Note  Hypokalemia - K+ = 2.9 and Creatinine = 2.12.  eICU Interventions  Plan: 1. Replace K+. 2. Repeat BMP at 12 noon.     Intervention Category Major Interventions: Electrolyte abnormality - evaluation and management  [name] 08/16/2020, 1:55 AM

## 2020-08-16 NOTE — Progress Notes (Signed)
NAME:  Jeff [name], MRN:  [id number], DOB:  09-20-1945, LOS: 2 ADMISSION DATE:  08/14/2020, CONSULTATION DATE:  08/14/20 REFERRING MD:  Dr Eulis Foster, CHIEF COMPLAINT: sob/ cardiogenic shock   History of Present Illness:  89 yobm smoker  currently being treated for prostate cancer with mets to the bone with oral chemotherapy.  He last had an infusion on 4-14. Admit to ER am 4/21 with onset sob w/in last 12 hours prior to arrival s cp or cough.  He denies sick contacts.  He does not wear oxygen at home.  No history of lung problems such as COPD or asthma.  He is not on any blood thinners, no previous history of PE.  Additional history of alcohol and cocaine use, chf last echo 35%.  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   Significant Hospital Events: Including procedures, antibiotic start and stop dates in addition to other pertinent events   . ech 4/21  EF 92%, G III diastolic dysfunction / mod LA RA enlargement, PAS est 63 and RA 15  . Q scan 4/21 >>> low probability  . Venous dopplers 4/21 >>> Negative bilaterally . BC x 2 4/21 >>> pending  Imaging CXR 08/08/2020 Right IJ approach porta cath removal and left subclavian approach Port-A-Cath placement with no adverse features. Chronic pulmonary fibrosis, mild cardiomegaly, diffuse sclerotic bone metastases.   Scheduled Meds: . aspirin EC  81 mg Oral Daily  . Chlorhexidine Gluconate Cloth  6 each Topical Daily  . furosemide  80 mg Intravenous BID  . heparin  5,000 Units Subcutaneous Q8H  . pantoprazole  40 mg Oral Daily  . potassium chloride  40 mEq Oral BID  . sodium chloride  flush  10-40 mL Intracatheter Q12H   Continuous Infusions: . amiodarone 30 mg/hr (08/16/20 1000)  . ceFEPime (MAXIPIME) IV Stopped (08/15/20 1934)  . metronidazole Stopped (08/16/20 0307)  . milrinone 0.375 mcg/kg/min (08/16/20 1000)  . potassium chloride 50 mL/hr at 08/16/20 1000  . vancomycin     PRN Meds:.   Interim History / Subjective:  No events. Wants some water. Remains on inotropes.  Objective   Blood pressure (!) 87/68, pulse 92, temperature 97.7 F (36.5 C), resp. rate (!) 22, height 6' (1.829 m), weight 65.3 kg, SpO2 94 %.        Intake/Output Summary (Last 24 hours) at 08/16/2020 1045 Last data filed at 08/16/2020 1000 Gross per 24 hour  Intake 1585.3 ml  Output 550 ml  Net 1035.3 ml   Filed Weights   08/14/20 2245 08/15/20 0402 08/16/20 0255  Weight: 65.7 kg 65.7 kg 65.3 kg   Physical Exam Constitutional: chronically ill frail cachetic man lying in bed  Eyes: EOMI, temporal wasting noted Ears, nose, mouth, and throat: MM dry, JVP up, trachea midline Cardiovascular: regular, ext lukewarm Respiratory: diminished at bases nonlabored Gastrointestinal: soft, hypoactive BS Skin: No rashes, normal turgor Neurologic: moves all 4 ext to command, weaker on R Psychiatric: poor insight   Coox 34% on milrinone  Assessment & Plan:  # Cardiogenic shock with pulmonary edema causing hypoxemia, acute renal injury, acute liver injury, and lactic acidemia # Severe protein calorie malnutrition # Hx of CVA # Metastatic prostate cancer to bone  on androgen deprivation therapy # Hx of polysubstance abuse including cocaine, none recent # Sepsis ruled out # Encounter for palliative care, approaching end of life # Questionable Afib, tele here shows none  - Attempt to offload LV with inotropes and lasix, appreciate CHF team coming on board - SLP consult - Palliative consult pending - Replete K - DC amio and heparin drips - DC abx, not sure what we are treating   Best  Practice    Code Status: DNR Goals of Care meeting: pending DVT: heparin subQ L/T/D: has a port. Condom cath  VAP : n/a PAD: n/a GI: n/a Diet: cardiac   Patient critically ill due to cardiogenic shock Interventions to address this today inotropes, diuresis Risk of deterioration without these interventions is high  I personally spent 32 minutes providing critical care not including any separately billable procedures  Erskine Emery MD [facility]  Prefer epic messenger for cross cover needs If after hours, please call E-link

## 2020-08-16 NOTE — Progress Notes (Signed)
SLP Cancellation Note  Patient Details Name: Zymere Patlan MRN: [id number] DOB: 10-May-1945   Cancelled treatment:       Reason Eval/Treat Not Completed: Other (comment). Pt was NPO this am when MD rounded for a potential procedure, but has been on diet previously and now without complication. No immediate reason from SLP eval. Will defer at this time. Please reorder if needed.    Dorn Hartshorne, Katherene Ponto 08/16/2020, 2:52 PM

## 2020-08-16 NOTE — Consult Note (Signed)
Advanced Heart Failure Team Consult Note   Primary Physician: Patient, No Pcp Per (Inactive) PCP-Cardiologist:  Carlyle Dolly, MD  Reason for Consultation: Cardiogenic shock  HPI:    Jeff [name] is seen today for evaluation of cardiogenic shock at the request of Dr. Harl Bowie.   75 y.o. with history of metastatic prostate cancer, prior CVA, and chronic systolic CHF was admitted to [facility] with dyspnea and found to have cardiogenic shock.   Patient has a long history of prostate CA, s/p orchiectomy and chemotherapy.  He has mets to bones and currently is on Zytiga.  He had a CVA in 7/21 with right-sided weakness.  Echo at that time showed EF 35% and he was positive for cocaine.  He did not have ischemic workup.  He has not been particularly compliant with cardiology followup though he appears to see his oncologist regularly.  He was seen by Dr. Harl Bowie in 11/21, at that time he was on chronic home midodrine for hypotension.  Extensive workup not pursued due to poor compliance.    He reports shortness of breath prior to admission, had had cough for a few days.  In ER at [facility], he was in shock with cool extremities, co-ox 23%.  Dobutamine 2 started with rise in co-ox to 63%.  Initial lactate 7.9.  HS-TnI 1279 => 199 => 259.  V/Q scan with no PE.  CHF noted on CXR.  He was empirically started on vancomycin/cefepime due to concern for infectious component.  PCT was elevated at 3.36.  AKI with creatinine up to 2.6. Also noted to by tachycardic => currently sinus tachy but possible MAT during this hospitalization. Started on amiodarone gtt.  He was started on dobutamine 2 with rise in co-ox to 63%, then transitioned to milrinone 0.125 and transferred to [facility]. Of note, cocaine negative this admission.  This morning, denies dyspnea or chest pain. Co-ox back down again to 34%.  Creatinine down to 2.12.  He remains in sinus tachy in 110s. BP stable. CVP 13-14.   PMH: 1. Prostate cancer: s/p  orchiectomy and chemotherapy.  Bony mets, chronically on Zytiga.  2. Chronic systolic CHF: Echo 7/37 with EF 35%.  History of cocaine use.   - Echo (4/22): EF 10-15%, mild LV dilation, moderate-severely decreased RV systolic function with mild RVE, at least moderate MR, mild AI, IVC dilated. 3. H/o cocaine abuse.  4. CKD stage 3 5. CVA: 7/21, right-sided weakness.   Review of Systems: All systems reviewed and negative except as per HPI.   Home Medications Prior to Admission medications   Medication Sig Start Date End Date Taking? Authorizing Provider  abiraterone acetate (ZYTIGA) 250 MG tablet TAKE 4 TABLETS (1,000 MG TOTAL) BY MOUTH DAILY. TAKE ON AN EMPTY STOMACH 1 HOUR BEFORE OR 2 HOURS AFTER A MEAL Patient taking differently: Take by mouth daily. Take on an empty stomach 1 hour before or 2 hours after a meal. 07/07/20 07/07/21 Yes Derek Jack, MD  aspirin EC 81 MG EC tablet Take 1 tablet (81 mg total) by mouth daily. Swallow whole. 11/01/19  Yes Amin, Ankit Chirag, MD  atorvastatin (LIPITOR) 40 MG tablet Take 1 tablet (40 mg total) by mouth daily. 11/01/19  Yes Amin, Jeanella Flattery, MD  famotidine (PEPCID) 10 MG tablet Take 1 tablet (10 mg total) by mouth daily as needed for heartburn or indigestion. 11/19/19  Yes Love, Ivan Anchors, PA-C  Menthol-Methyl Salicylate (MUSCLE RUB) 10-15 % CREA Apply 1 application topically 2 (  two) times daily as needed for muscle pain. To back and hips 11/19/19  Yes Love, Ivan Anchors, PA-C  midodrine (PROAMATINE) 10 MG tablet Take 1 tablet (10 mg total) by mouth 3 (three) times daily with meals. 11/19/19  Yes Love, Ivan Anchors, PA-C  simethicone (MYLICON) 80 MG chewable tablet Chew 1 tablet (80 mg total) by mouth 4 (four) times daily as needed for flatulence. 11/19/19  Yes Love, Ivan Anchors, PA-C  predniSONE (DELTASONE) 5 MG tablet Take 1 tablet (5 mg total) by mouth daily with breakfast. Patient not taking: Reported on 08/14/2020 03/12/20   Derek Jack, MD   predniSONE (DELTASONE) 5 MG tablet TAKE 1 TABLET BY MOUTH DAILY WITH BREAKFAST Patient not taking: Reported on 08/14/2020 01/21/20 01/20/21  Derek Jack, MD    Past Surgical History: Past Surgical History:  Procedure Laterality Date  . ORCHIECTOMY Bilateral 02/05/2014   Procedure: BILATERAL ORCHIECTOMY;  Surgeon: Festus Aloe, MD;  Location: [facility] ORS;  Service: Urology;  Laterality: Bilateral;  . PERCUTANEOUS NEPHROSTOMY Bilateral    IR Dr. Junious Silk changed on 04/22/2014  . PORT-A-CATH REMOVAL Right 08/08/2017   Procedure: REMOVAL PORT-A-CATH RIGHT CHEST (PROCEDURE #2);  Surgeon: Aviva Signs, MD;  Location: [facility] ORS;  Service: General;  Laterality: Right;  . PORTACATH PLACEMENT Right 03/12/14  . PORTACATH PLACEMENT Left 08/08/2017   Procedure: INSERTION POWER PORT WITH ATTACHED CATHETER LEFT SUBCLAVIAN (PROCEDURE #1);  Surgeon: Aviva Signs, MD;  Location: [facility] ORS;  Service: General;  Laterality: Left;    Family History: Family History  Problem Relation Age of Onset  . Cancer Brother 50    Social History: Social History   Socioeconomic History  . Marital status: Single    Spouse name: Not on file  . Number of children: 1  . Years of education: 9th  . Highest education level: Not on file  Occupational History  . Occupation: disabilty    Employer: [employer]  Tobacco Use  . Smoking status: Current Every Day Smoker    Packs/day: 0.25    Years: 55.00    Pack years: 13.75    Types: Cigarettes  . Smokeless tobacco: Never Used  Vaping Use  . Vaping Use: Never used  Substance and Sexual Activity  . Alcohol use: Not Currently    Alcohol/week: 1.0 standard drink    Types: 1 Cans of beer per week  . Drug use: Yes    Types: Cocaine    Comment: last used Cocaine 01/25/14  . Sexual activity: Yes  Other Topics Concern  . Not on file  Social History Narrative   Lives at [employer] 9th grade   1 son, lives in [place], [place]   Can read and  write in native language            Social Determinants of Health   Financial Resource Strain: Low Risk   . Difficulty of Paying Living Expenses: Not hard at all  Food Insecurity: No Food Insecurity  . Worried About Charity fundraiser in the Last Year: Never true  . Ran Out of Food in the Last Year: Never true  Transportation Needs: No Transportation Needs  . Lack of Transportation (Medical): No  . Lack of Transportation (Non-Medical): No  Physical Activity: Inactive  . Days of Exercise per Week: 0 days  . Minutes of Exercise per Session: 0 min  Stress: No Stress Concern Present  . Feeling of Stress : Not at all  Social Connections: Socially Isolated  .  Frequency of Communication with Friends and Family: More than three times a week  . Frequency of Social Gatherings with Friends and Family: Three times a week  . Attends Religious Services: Never  . Active Member of Clubs or Organizations: No  . Attends Archivist Meetings: Never  . Marital Status: Never married    Allergies:  No Known Allergies  Objective:    Vital Signs:   Temp:  [97.7 F (36.5 C)-98.4 F (36.9 C)] 97.7 F (36.5 C) (04/23 0802) Pulse Rate:  [25-163] 139 (04/23 0630) Resp:  [13-35] 22 (04/23 0630) BP: (66-180)/(28-154) 93/73 (04/23 0630) SpO2:  [91 %-98 %] 96 % (04/23 0630) Weight:  [65.3 kg] 65.3 kg (04/23 0255) Last BM Date: 08/15/20  Weight change: Filed Weights   08/14/20 2245 08/15/20 0402 08/16/20 0255  Weight: 65.7 kg 65.7 kg 65.3 kg    Intake/Output:   Intake/Output Summary (Last 24 hours) at 08/16/2020 0844 Last data filed at 08/16/2020 0600 Gross per 24 hour  Intake 1248.48 ml  Output 550 ml  Net 698.48 ml      Physical Exam    General:  Thin, NAD HEENT: normal Neck: supple. JVP 12 cm. Carotids 2+ bilat; no bruits. No lymphadenopathy or thyromegaly appreciated. Cor: PMI nondisplaced. Mildly tachy, regular rate & rhythm. No rubs, gallops or murmurs. Lungs:  clear Abdomen: soft, nontender, nondistended. No hepatosplenomegaly. No bruits or masses. Good bowel sounds. Extremities: no cyanosis, clubbing, rash, edema Neuro: alert & orientedx3, cranial nerves grossly intact. moves all 4 extremities w/o difficulty. Affect pleasant   Telemetry   Sinus tachy 110s (personally reviewed)   Labs   Basic Metabolic Panel: Recent Labs  Lab 08/14/20 1208 08/15/20 0410 08/16/20 0027  NA 136 138 137  K 3.6 3.8 2.9*  CL 104 106 107  CO2 12* 16* 20*  GLUCOSE 106* 134* 117*  BUN 53* 61* 45*  CREATININE 2.68* 2.60* 2.12*  CALCIUM 8.0* 8.2* 7.3*  MG  --  2.0  --     Liver Function Tests: Recent Labs  Lab 08/14/20 1208 08/15/20 0410  AST 106* 182*  ALT 66* 111*  ALKPHOS 182* 227*  BILITOT 2.5* 2.0*  PROT 7.8 7.5  ALBUMIN 3.6 3.4*   No results for input(s): LIPASE, AMYLASE in the last 168 hours. No results for input(s): AMMONIA in the last 168 hours.  CBC: Recent Labs  Lab 08/14/20 1208 08/15/20 0410 08/16/20 0730  WBC 9.3 11.6* PENDING  NEUTROABS 6.9  --   --   HGB 11.6* 11.4* 10.6*  HCT 35.7* 35.2* 32.8*  MCV 97.0 93.6 94.3  PLT 205 194 185    Cardiac Enzymes: No results for input(s): CKTOTAL, CKMB, CKMBINDEX, TROPONINI in the last 168 hours.  BNP: BNP (last 3 results) Recent Labs    08/14/20 1208  BNP >4,500.0*    ProBNP (last 3 results) No results for input(s): PROBNP in the last 8760 hours.   CBG: No results for input(s): GLUCAP in the last 168 hours.  Coagulation Studies: Recent Labs    08/14/20 1208  LABPROT 19.1*  INR 1.6*     Imaging    No results found.   Medications:     Current Medications: . aspirin EC  81 mg Oral Daily  . Chlorhexidine Gluconate Cloth  6 each Topical Daily  . furosemide  80 mg Intravenous BID  . heparin  5,000 Units Subcutaneous Q8H  . pantoprazole  40 mg Oral Daily  . potassium chloride  40 mEq  Oral BID  . sodium chloride flush  10-40 mL Intracatheter Q12H      Infusions: . amiodarone 30 mg/hr (08/16/20 0639)  . ceFEPime (MAXIPIME) IV Stopped (08/15/20 1934)  . metronidazole Stopped (08/16/20 0307)  . milrinone 0.125 mcg/kg/min (08/16/20 0600)  . potassium chloride 10 mEq (08/16/20 0700)  . potassium chloride    . vancomycin         Assessment/Plan   1. Acute/chronic systolic CHF/cardiogenic shock:  Echo in 7/21 with EF 35%.  Echo this admission with EF 10-15%, mild LV dilation, moderate-severely decreased RV systolic function with mild RVE, at least moderate MR, mild AI, IVC dilated. Suspect cardiogenic shock with low co-ox.  Cause of CMP uncertain, has history of cocaine abuse though cocaine negative this admission.  Never had ischemic workup.  This morning, on milrinone 0.125 with co-ox 34% and CVP 13-14.  - Increase milrinone to 0.375 and repeat co-ox.  - Lasix 80 mg IV bid and replace K.  - Aim for eventual RHC/LHC when creatinine improved (?Monday).  - Not candidate for advanced therapies with poor compliance, h/o drug abuse, and metastatic prostate CA.  2. AKI on CKD stage 3: Suspect due to cardiogenic shock.  Creatinine trending down, 2.12 this morning.  Hopefully creatinine will continue to trend down on inotropes.  3. Elevated HS-TnI: Possible demand ischemia with cardiogenic shock.  Did not trend upward to suggest ACS.  - Continue ASA 81.  4. Elevated LFTs: Mild, suspect shock liver.  5. Rhythm: May have had MAT earlier in hospitalization, I do not see definite atrial flutter or atrial fibrillation. He is in sinus tachy currently.  - Continue amiodarone gtt for now.  - Can stop IV heparin and make DVT prophylactic dose.  6. ID: PCT elevated 3.3, he is on empiric abx (vanomycin/cefepime).  WBCs mildly elevated.  - Suspect we can stop abx soon.  7. Prostate CA: Extensive bony mets.  He is DNR.  - On Zytiga at home.  8. H/o cocaine abuse: Negative UDS this admission.    Length of Stay: 2  Loralie Champagne, MD  08/16/2020,  8:44 AM  Advanced Heart Failure Team Pager [phone] (M-F; 7a - 5p)  Please contact [facility] for night-coverage after hours (4p -7a ) and weekends on [email or web address]

## 2020-08-17 DIAGNOSIS — I5043 Acute on chronic combined systolic (congestive) and diastolic (congestive) heart failure: Secondary | ICD-10-CM | POA: Diagnosis not present

## 2020-08-17 LAB — CBC
HCT: 33 % — ABNORMAL LOW (ref 39.0–52.0)
Hemoglobin: 10.9 g/dL — ABNORMAL LOW (ref 13.0–17.0)
MCH: 31 pg (ref 26.0–34.0)
MCHC: 33 g/dL (ref 30.0–36.0)
MCV: 93.8 fL (ref 80.0–100.0)
Platelets: 184 10*3/uL (ref 150–400)
RBC: 3.52 MIL/uL — ABNORMAL LOW (ref 4.22–5.81)
RDW: 20.4 % — ABNORMAL HIGH (ref 11.5–15.5)
WBC: 7.9 10*3/uL (ref 4.0–10.5)
nRBC: 6.2 % — ABNORMAL HIGH (ref 0.0–0.2)

## 2020-08-17 LAB — BASIC METABOLIC PANEL
Anion gap: 13 (ref 5–15)
BUN: 46 mg/dL — ABNORMAL HIGH (ref 8–23)
CO2: 18 mmol/L — ABNORMAL LOW (ref 22–32)
Calcium: 7.4 mg/dL — ABNORMAL LOW (ref 8.9–10.3)
Chloride: 103 mmol/L (ref 98–111)
Creatinine, Ser: 2.93 mg/dL — ABNORMAL HIGH (ref 0.61–1.24)
GFR, Estimated: 22 mL/min — ABNORMAL LOW (ref >60)
Glucose, Bld: 151 mg/dL — ABNORMAL HIGH (ref 70–99)
Potassium: 4 mmol/L (ref 3.5–5.1)
Sodium: 134 mmol/L — ABNORMAL LOW (ref 135–145)

## 2020-08-17 LAB — COMPREHENSIVE METABOLIC PANEL
ALT: 117 U/L — ABNORMAL HIGH (ref 0–44)
AST: 106 U/L — ABNORMAL HIGH (ref 15–41)
Albumin: 2.9 g/dL — ABNORMAL LOW (ref 3.5–5.0)
Alkaline Phosphatase: 179 U/L — ABNORMAL HIGH (ref 38–126)
Anion gap: 14 (ref 5–15)
BUN: 45 mg/dL — ABNORMAL HIGH (ref 8–23)
CO2: 17 mmol/L — ABNORMAL LOW (ref 22–32)
Calcium: 7.5 mg/dL — ABNORMAL LOW (ref 8.9–10.3)
Chloride: 103 mmol/L (ref 98–111)
Creatinine, Ser: 2.68 mg/dL — ABNORMAL HIGH (ref 0.61–1.24)
GFR, Estimated: 24 mL/min — ABNORMAL LOW (ref >60)
Glucose, Bld: 131 mg/dL — ABNORMAL HIGH (ref 70–99)
Potassium: 4 mmol/L (ref 3.5–5.1)
Sodium: 134 mmol/L — ABNORMAL LOW (ref 135–145)
Total Bilirubin: 1.6 mg/dL — ABNORMAL HIGH (ref 0.3–1.2)
Total Protein: 6.9 g/dL (ref 6.5–8.1)

## 2020-08-17 LAB — COOXEMETRY PANEL
Carboxyhemoglobin: 0.9 % (ref 0.5–1.5)
Carboxyhemoglobin: 0.7 % (ref 0.5–1.5)
Methemoglobin: 0.7 % (ref 0.0–1.5)
Methemoglobin: 0.7 % (ref 0.0–1.5)
O2 Saturation: 40.3 %
O2 Saturation: 31.4 %
Total hemoglobin: 10.7 g/dL — ABNORMAL LOW (ref 12.0–16.0)
Total hemoglobin: 11.1 g/dL — ABNORMAL LOW (ref 12.0–16.0)

## 2020-08-17 LAB — HEPARIN LEVEL (UNFRACTIONATED): Heparin Unfractionated: <0.1 IU/mL — ABNORMAL LOW (ref 0.30–0.70)

## 2020-08-17 MED ORDER — FUROSEMIDE 10 MG/ML IJ SOLN
80.0000 mg | Freq: Two times a day (BID) | INTRAMUSCULAR | Status: DC
  Administered 2020-08-17 – 2020-08-18 (×3): 80 mg via INTRAVENOUS
  Filled 2020-08-17 (×3): qty 8

## 2020-08-17 NOTE — Progress Notes (Signed)
NAME:  Jeff [name], MRN:  [id number], DOB:  Jul 01, 1945, LOS: 3 ADMISSION DATE:  08/14/2020, CONSULTATION DATE:  08/14/20 REFERRING MD:  Dr Eulis Foster, CHIEF COMPLAINT: sob/ cardiogenic shock   History of Present Illness:  75 y/o M, smoker, currently being treated for prostate cancer with mets to the bone with oral chemotherapy. He last had an infusion on 4/14. Presented to ER am 4/21 with onset SOB w/in last 12 hours prior to arrival without chest pain or cough.  He does not wear oxygen at home.  No history of lung problems such as COPD or asthma.  He is not on any blood thinners, no previous history of PE.  Additional history of alcohol and cocaine use, CHF last echo 35%.  He is non-compliant with cardiology clinic follow up   Pertinent  Medical History  Acute renal failure ([place]) (01/26/2014), Alcohol abuse, Anemia of chronic disease (01/26/2014), Bilateral hydronephrosis (01/26/2014), History of cocaine abuse ([place]) (01/26/2014), Homelessness (01/31/2014), Prostate cancer metastatic to multiple sites [facility]) (01/30/2014), Sepsis ([place]), and UTI (lower urinary tract infection).   Significant Hospital Events: Including procedures, antibiotic start and stop dates in addition to other pertinent events    4/21 Admit with acute SOB. ECHO 4/21 EF 10%, Grade III diastolic dysfunction / mod LA RA enlargement, PAS est 63 and RA 15. V/Q scan with low probability for PE.  LE dopplers negative bilaterally  4/21 BCx2 >>   4/23 Remains on inotropes   Interim History / Subjective:  On 2L O2  Afebrile / WBC 7.9  I/O 1L UOP, essentially net even for last 24 hours  SvO2 40.3 off levophed > levo restarted & repeat SvO2 pending Remains on milrinone, levo, amio gtts  Objective   Blood pressure (!) 80/60, pulse (!) 109, temperature 98.4 F (36.9 C), resp. rate 17, height 6' (1.829 m), weight 65.3 kg, SpO2 94 %. CVP:  [7 mmHg-18 mmHg] 17 mmHg      Intake/Output Summary (Last 24 hours) at 08/17/2020 1039 Last  data filed at 08/17/2020 0900 Gross per 24 hour  Intake 1089.11 ml  Output 1075 ml  Net 14.11 ml   Filed Weights   08/15/20 0402 08/16/20 0255 08/17/20 0310  Weight: 65.7 kg 65.3 kg 65.3 kg   Physical Exam General: thin, elderly adult male lying in bed in NAD HEENT: MM pink/moist, [place] O2 2L, anicteric  Neuro: AAOx4, speech clear, MAE  CV: s1s2 RRR, no m/r/g PULM: non-labored on [place], lungs bilaterally clear  GI: soft, bsx4 active  Extremities: warm/dry, no edema  Skin: no rashes or lesions   Assessment & Plan:   # Cardiogenic shock with pulmonary edema causing hypoxemia, acute renal injury, acute liver injury, and lactic acidemia # Severe protein calorie malnutrition # Hx of CVA # Metastatic prostate cancer to bone on androgen deprivation therapy # Hx of polysubstance abuse including cocaine, none recent # Sepsis ruled out # Encounter for palliative care, approaching end of life # Questionable Afib, tele here shows none # Hyponatremia   # AKI in setting of Shock # Likely hepatic congestion # Anemia   -continue inotropes, pressors per CHF team. Appreciate assistance with care.  -Palliative consult pending  -tele monitoring -continue amiodarone gtt  -follow electrolytes closely, goal K >4, Mg >2 with arrhythmias  -appreciate SLP, await evaluation  -Trend BMP / urinary output   Best Practice   Code Status: DNR Goals of Care meeting: pending DVT: heparin subQ L/T/D: has a port. Condom cath  VAP : n/a  PAD: n/a GI: n/a Diet: cardiac     Critical Care Time:     Noe Gens, MSN, APRN, NP-C, AGACNP-BC [facility] 08/17/2020, 10:45 AM   Please see [email or web address] for pager details.   From 7A-7P if no response, please call [phone] After hours, please call ELink [phone]

## 2020-08-17 NOTE — Progress Notes (Signed)
Patient ID: Jeff [name], male   DOB: 1945/10/30, 75 y.o.   MRN: [id number]     Advanced Heart Failure Rounding Note  PCP-Cardiologist: Carlyle Dolly, MD   Subjective:    Feels ok at rest.    Co-ox 40% today on milrinone 0.375.  NE turned off overnight due to tachycardia, he is now on amiodarone 60 mg/hr.  HR jumps up as high as 150s but looks like sinus tachy versus MAT.  Currently ST in 110s-120s.  UOP 1075, creatinine up to 2.68.  SBP 90s-100s.  CVP 17.    Objective:   Weight Range: 65.3 kg Body mass index is 19.52 kg/m.   Vital Signs:   Temp:  [97.5 F (36.4 C)-98.5 F (36.9 C)] 97.5 F (36.4 C) (04/23 1954) Pulse Rate:  [76-150] 83 (04/24 0700) Resp:  [13-32] 24 (04/24 0700) BP: (74-144)/(42-133) 97/67 (04/24 0700) SpO2:  [82 %-100 %] 95 % (04/24 0700) Weight:  [65.3 kg] 65.3 kg (04/24 0310) Last BM Date: 08/16/20  Weight change: Filed Weights   08/15/20 0402 08/16/20 0255 08/17/20 0310  Weight: 65.7 kg 65.3 kg 65.3 kg    Intake/Output:   Intake/Output Summary (Last 24 hours) at 08/17/2020 0739 Last data filed at 08/17/2020 0600 Gross per 24 hour  Intake 1299.82 ml  Output 1075 ml  Net 224.82 ml      Physical Exam    General:  Well appearing. No resp difficulty HEENT: Normal Neck: Supple. JVP 12+ cm. Carotids 2+ bilat; no bruits. No lymphadenopathy or thyromegaly appreciated. Cor: PMI lateral. Tachy, regular rate & rhythm. No rubs, gallops or murmurs. Lungs: Clear Abdomen: Soft, nontender, nondistended. No hepatosplenomegaly. No bruits or masses. Good bowel sounds. Extremities: No cyanosis, clubbing, rash, edema Neuro: Alert & orientedx3, cranial nerves grossly intact. moves all 4 extremities w/o difficulty. Affect pleasant   Telemetry   Sinus tachy with PVCs and PACs (personally reviewed)  Labs    CBC Recent Labs    08/14/20 1208 08/15/20 0410 08/16/20 0730 08/17/20 0309  WBC 9.3   < > 8.1 7.9  NEUTROABS 6.9  --   --   --   HGB 11.6*    < > 10.6* 10.9*  HCT 35.7*   < > 32.8* 33.0*  MCV 97.0   < > 94.3 93.8  PLT 205   < > 185 184   < > = values in this interval not displayed.   Basic Metabolic Panel Recent Labs    08/15/20 0410 08/16/20 0027 08/16/20 1140 08/17/20 0309  NA 138   < > 138 134*  K 3.8   < > 4.0 4.0  CL 106   < > 109 103  CO2 16*   < > 19* 17*  GLUCOSE 134*   < > 133* 131*  BUN 61*   < > 44* 45*  CREATININE 2.60*   < > 2.28* 2.68*  CALCIUM 8.2*   < > 7.4* 7.5*  MG 2.0  --   --   --    < > = values in this interval not displayed.   Liver Function Tests Recent Labs    08/15/20 0410 08/17/20 0309  AST 182* 106*  ALT 111* 117*  ALKPHOS 227* 179*  BILITOT 2.0* 1.6*  PROT 7.5 6.9  ALBUMIN 3.4* 2.9*   No results for input(s): LIPASE, AMYLASE in the last 72 hours. Cardiac Enzymes No results for input(s): CKTOTAL, CKMB, CKMBINDEX, TROPONINI in the last 72 hours.  BNP: BNP (last 3 results) Recent  Labs    08/14/20 1208  BNP >4,500.0*    ProBNP (last 3 results) No results for input(s): PROBNP in the last 8760 hours.   D-Dimer No results for input(s): DDIMER in the last 72 hours. Hemoglobin A1C No results for input(s): HGBA1C in the last 72 hours. Fasting Lipid Panel No results for input(s): CHOL, HDL, LDLCALC, TRIG, CHOLHDL, LDLDIRECT in the last 72 hours. Thyroid Function Tests No results for input(s): TSH, T4TOTAL, T3FREE, THYROIDAB in the last 72 hours.  Invalid input(s): FREET3  Other results:   Imaging     No results found.   Medications:     Scheduled Medications: . aspirin EC  81 mg Oral Daily  . Chlorhexidine Gluconate Cloth  6 each Topical Daily  . furosemide  80 mg Intravenous BID  . heparin  5,000 Units Subcutaneous Q8H  . pantoprazole  40 mg Oral Daily  . potassium chloride  40 mEq Oral BID  . sodium chloride flush  10-40 mL Intracatheter Q12H     Infusions: . amiodarone 60 mg/hr (08/17/20 0600)  . milrinone 0.375 mcg/kg/min (08/17/20 0600)  .  norepinephrine (LEVOPHED) Adult infusion Stopped (08/16/20 2132)     PRN Medications:  acetaminophen **OR** acetaminophen, albuterol, ondansetron **OR** ondansetron (ZOFRAN) IV, polyethylene glycol, sodium chloride flush  Assessment/Plan   1. Acute/chronic systolic CHF/cardiogenic shock:  Echo in 7/21 with EF 35%.  Echo this admission with EF 10-15%, mild LV dilation, moderate-severely decreased RV systolic function with mild RVE, at least moderate MR, mild AI, IVC dilated. Suspect cardiogenic shock with low co-ox.  Cause of CMP uncertain, has history of cocaine abuse though cocaine negative this admission.  Never had ischemic workup.  This morning, on milrinone 0.375 with co-ox 40% and CVP 17.  Creatinine up to 2.68.  - Continue milrinone 0.375 and add back norepinephrine 3.  Recheck co-ox.  - Continue amiodarone gtt to control rate (?MAT runs versus AT).   - Lasix 80 mg IV bid today with rising CVP.   - If improves, will need RHC/LHC.  - Not candidate for advanced therapies with poor compliance, h/o drug abuse, and metastatic prostate CA.  It looks like he has end stage HF at this point, would not institute temporary mechanical support like Impella with no end point options (LVAD).  Consult palliative care, will need to begin to think about hospice/comfort care if he does not turn around with medical management today.  He is DNR.  2. AKI on CKD stage 3: Suspect due to cardiogenic shock.  Creatinine up to 2.68 this morning with rising CVP.  - Support MAP/CO with addition of norepinephrine.  - Will need diuresis as above.  3. Elevated HS-TnI: Possible demand ischemia with cardiogenic shock.  Did not trend upward to suggest ACS.  - Continue ASA 81.  4. Elevated LFTs: Mild, suspect shock liver.  5. Rhythm: He may have runs of MAT versus atrial tachycardia, I do not see definite atrial flutter or atrial fibrillation. He is in sinus tachy currently.  - Continue amiodarone gtt for now.  - DVT dose  heparin 6. ID: Off antibiotics.   7. Prostate CA: Extensive bony mets.  He is DNR.  - On Zytiga at home.  8. H/o cocaine abuse: Negative UDS this admission.   CRITICAL CARE Performed by: Loralie Champagne  Total critical care time: 35 minutes  Critical care time was exclusive of separately billable procedures and treating other patients.  Critical care was necessary to treat or prevent imminent  or life-threatening deterioration.  Critical care was time spent personally by me on the following activities: development of treatment plan with patient and/or surrogate as well as nursing, discussions with consultants, evaluation of patient's response to treatment, examination of patient, obtaining history from patient or surrogate, ordering and performing treatments and interventions, ordering and review of laboratory studies, ordering and review of radiographic studies, pulse oximetry and re-evaluation of patient's condition.   Length of Stay: 3  Loralie Champagne, MD  08/17/2020, 7:39 AM  Advanced Heart Failure Team Pager [phone] (M-F; 7a - 5p)  Please contact [facility] for night-coverage after hours (5p -7a ) and weekends on [email or web address]

## 2020-08-18 DIAGNOSIS — R0603 Acute respiratory distress: Secondary | ICD-10-CM

## 2020-08-18 DIAGNOSIS — Z66 Do not resuscitate: Secondary | ICD-10-CM

## 2020-08-18 DIAGNOSIS — Z515 Encounter for palliative care: Secondary | ICD-10-CM

## 2020-08-18 DIAGNOSIS — R0902 Hypoxemia: Secondary | ICD-10-CM

## 2020-08-18 LAB — COMPREHENSIVE METABOLIC PANEL
ALT: 92 U/L — ABNORMAL HIGH (ref 0–44)
AST: 77 U/L — ABNORMAL HIGH (ref 15–41)
Albumin: 2.8 g/dL — ABNORMAL LOW (ref 3.5–5.0)
Alkaline Phosphatase: 187 U/L — ABNORMAL HIGH (ref 38–126)
Anion gap: 12 (ref 5–15)
BUN: 48 mg/dL — ABNORMAL HIGH (ref 8–23)
CO2: 17 mmol/L — ABNORMAL LOW (ref 22–32)
Calcium: 7 mg/dL — ABNORMAL LOW (ref 8.9–10.3)
Chloride: 103 mmol/L (ref 98–111)
Creatinine, Ser: 3.55 mg/dL — ABNORMAL HIGH (ref 0.61–1.24)
GFR, Estimated: 17 mL/min — ABNORMAL LOW (ref >60)
Glucose, Bld: 120 mg/dL — ABNORMAL HIGH (ref 70–99)
Potassium: 4 mmol/L (ref 3.5–5.1)
Sodium: 132 mmol/L — ABNORMAL LOW (ref 135–145)
Total Bilirubin: 1 mg/dL (ref 0.3–1.2)
Total Protein: 6.7 g/dL (ref 6.5–8.1)

## 2020-08-18 LAB — COOXEMETRY PANEL
Carboxyhemoglobin: 0.7 % (ref 0.5–1.5)
Methemoglobin: 0.8 % (ref 0.0–1.5)
O2 Saturation: 36.4 %
Total hemoglobin: 11.2 g/dL — ABNORMAL LOW (ref 12.0–16.0)

## 2020-08-18 LAB — CBC
HCT: 31.7 % — ABNORMAL LOW (ref 39.0–52.0)
Hemoglobin: 10.4 g/dL — ABNORMAL LOW (ref 13.0–17.0)
MCH: 30.4 pg (ref 26.0–34.0)
MCHC: 32.8 g/dL (ref 30.0–36.0)
MCV: 92.7 fL (ref 80.0–100.0)
Platelets: 159 10*3/uL (ref 150–400)
RBC: 3.42 MIL/uL — ABNORMAL LOW (ref 4.22–5.81)
RDW: 20.5 % — ABNORMAL HIGH (ref 11.5–15.5)
WBC: 8 10*3/uL (ref 4.0–10.5)
nRBC: 4 % — ABNORMAL HIGH (ref 0.0–0.2)

## 2020-08-18 LAB — PATHOLOGIST SMEAR REVIEW

## 2020-08-18 MED ORDER — LORAZEPAM 2 MG/ML IJ SOLN: 1.0000 mg | INTRAMUSCULAR | Status: DC | PRN

## 2020-08-18 MED ORDER — ORAL CARE MOUTH RINSE
15.0000 mL | Freq: Two times a day (BID) | OROMUCOSAL | Status: DC
  Administered 2020-08-19: 15 mL via OROMUCOSAL

## 2020-08-18 MED ORDER — MORPHINE SULFATE (PF) 2 MG/ML IV SOLN
2.0000 mg | INTRAVENOUS | Status: DC | PRN
  Administered 2020-08-18 – 2020-08-19 (×5): 2 mg via INTRAVENOUS
  Filled 2020-08-18 (×6): qty 1

## 2020-08-18 NOTE — Consult Note (Signed)
Consultation Note Date: 08/18/2020   Patient Name: Jeff [name]  DOB: [birth year]  MRN: [id number]  Age / Sex: 75 y.o., male  PCP: Patient, No Pcp Per (Inactive) Referring Physician: Garner Nash, DO  Reason for Consultation: Establishing goals of care and Psychosocial/spiritual support  HPI/Patient Profile: 75 y.o. male   admitted on 08/14/2020, currently being treated for prostate cancer with mets to the bone with oral chemotherapy. He last had an infusion on 4/14. Presented to ER am 4/21 with onset SOB w/in last 12 hours prior to arrival without chest pain or cough. He does not wear oxygen at home. No history of lung problems such as COPD or asthma.   Additional history of alcohol and cocaine use, CHFlast echo 35%.Poor patient f/u with cardiology clinic    ED Course: O2 sat 76% on room air, documented heart rate ranging from 29-109, blood pressure 102-132.  Temperature 97.5.  Troponin 1279 >> 199.  Lactic acidosis of 7.9 twice.  WBC 9.3.  Creatinine elevated 2.68.  BNP greater than 4500.  ABG showed pH of 7.3, PCO2 28, PO2 less than 31. VQ scan negative for PE.  Chest x-ray showed cardiac enlargement and CHF.  Bilat LE Dopplers negative for DVT.   Cardiologist- dr. Harl Bowie and critical care- Dr Melvyn Novas were consulted.  Patient currently not a candidate for urgent catheterization given his renal failure, metastatic prostate cancer.  Co-ox was very low at 23%, heparin drip, dobutamine drip and IV lasix started.  Echo showed EF of 10 - 15% with global hypokinesis.  Patient currently on milrinone, amiodarone and Levophed drips.  Co-ox 36%.  Creatinine continues to rise today/ 3.5.  Patient faces treatment option decisions, advanced directive decisions and anticipatory care needs..  Clinical Assessment and Goals of Care:   This NP Wadie Lessen reviewed medical records, received report from team, assessed  the patient and then meet at the patient's bedside  to discuss diagnosis, prognosis, GOC, EOL wishes disposition and options.  Concept of Palliative Care was introduced as specialized medical care for people and their families living with serious illness.  If focuses on providing relief from the symptoms and stress of a serious illness.  The goal is to improve quality of life for both the patient and the family.  Values and goals of care important to patient  were attempted to be elicited.    Patient is lethargic but oriented to person and situation.   He has communicated to the attending/Dr Icard that he desires a focus on comfort vs life prolonging approach.  I too spoke to patient at bedside, he acknowledges the seriousness of his current medical situation, and desire for focus on comfort allowing for a natural death    Education offered on the difference between an aggressive medical intervention path and a palliative comfort path for this patient at this time in this situation.  I spoke to the patient's friend/support person [name].  I updated him on the patient's current medical situation and the patient's desire to  shift to a full comfort path.  I shared with him our concern that patient's time is very limited, anything could happen at any time.  I encouraged him to visit if he so desires,  and explained that visitation has been liberalized.     Natural trajectory and expectations at EOL were discussed.  Questions and concerns addressed.  Patient  encouraged to call with questions or concerns.     PMT will continue to support holistically.          No documented H POA or advanced care planning documents noted.  Patient's friend/Nick Stephens November has been his main contact and support person.  Mr. Onnie Boer gives me permission to speak to [name] regarding current medical situation.       SUMMARY OF RECOMMENDATIONS    Focus of care is comfort, quality and dignity.   Allowing for a natural death.   DNR Galvin Proffer  No artificial feeding or hydration now or in the future  No escalation of care, wean IV medications to off   Symptom management specific to pain, dyspnea and agitation  No further diagnostics, lab draws, testing   Watchful waiting for prognostication    Symptom Management:   Pain/Dyspnea: Morphine IV 2 mg every 1 hour as needed  Agitation: Ativan IV 1 mg every 4 hours as needed  Palliative Prophylaxis:   Frequent Pain Assessment and Oral Care  Additional Recommendations (Limitations, Scope, Preferences):  Full Comfort Care  Psycho-social/Spiritual:   Desire for further Chaplaincy support:yes  Additional Recommendations: Grief/Bereavement Support  Prognosis:   Hours - Days  Discharge Planning: To Be Determined      Primary Diagnoses: Present on Admission: . Cardiogenic shock ([place]) . Acute respiratory failure with hypoxemia ([place]) . Malignant neoplasm of prostate ([place]) . Acute renal failure ([place]) . CKD (chronic kidney disease) stage 3, GFR 30-59 ml/min (HCC) . Prostate cancer metastatic to bone ([place]) . Lactic acidosis . Elevated troponin . Acute on chronic combined systolic and diastolic CHF (congestive heart failure)-EF 10 %/Grade 2 DD . SIRS (systemic inflammatory response syndrome) (HCC)   I have reviewed the medical record, interviewed the patient and family, and examined the patient. The following aspects are pertinent.  Past Medical History:  Diagnosis Date  . Acute renal failure ([place]) 01/26/2014  . Alcohol abuse   . Anemia of chronic disease 01/26/2014  . Bilateral hydronephrosis 01/26/2014  . History of cocaine abuse ([place]) 01/26/2014  . Homelessness 01/31/2014  . Prostate cancer metastatic to multiple sites ([place]) 01/30/2014  . Sepsis ([place])   . UTI (lower urinary tract infection)    Social History   Socioeconomic History  . Marital status: Single    Spouse name: Not on file  . Number of children:  1  . Years of education: 9th  . Highest education level: Not on file  Occupational History  . Occupation: disabilty    Employer: [employer]  Tobacco Use  . Smoking status: Current Every Day Smoker    Packs/day: 0.25    Years: 55.00    Pack years: 13.75    Types: Cigarettes  . Smokeless tobacco: Never Used  Vaping Use  . Vaping Use: Never used  Substance and Sexual Activity  . Alcohol use: Not Currently    Alcohol/week: 1.0 standard drink    Types: 1 Cans of beer per week  . Drug use: Yes    Types: Cocaine    Comment: last used Cocaine 01/25/14  . Sexual activity: Yes  Other Topics  Concern  . Not on file  Social History Narrative   Lives at [employer] 9th grade   1 son, lives in [place], [place]   Can read and write in native language            Social Determinants of Health   Financial Resource Strain: Low Risk   . Difficulty of Paying Living Expenses: Not hard at all  Food Insecurity: No Food Insecurity  . Worried About Charity fundraiser in the Last Year: Never true  . Ran Out of Food in the Last Year: Never true  Transportation Needs: No Transportation Needs  . Lack of Transportation (Medical): No  . Lack of Transportation (Non-Medical): No  Physical Activity: Inactive  . Days of Exercise per Week: 0 days  . Minutes of Exercise per Session: 0 min  Stress: No Stress Concern Present  . Feeling of Stress : Not at all  Social Connections: Socially Isolated  . Frequency of Communication with Friends and Family: More than three times a week  . Frequency of Social Gatherings with Friends and Family: Three times a week  . Attends Religious Services: Never  . Active Member of Clubs or Organizations: No  . Attends Archivist Meetings: Never  . Marital Status: Never married   Family History  Problem Relation Age of Onset  . Cancer Brother 60   Scheduled Meds: . aspirin EC  81 mg Oral Daily  . Chlorhexidine Gluconate Cloth  6 each  Topical Daily  . furosemide  80 mg Intravenous BID  . heparin  5,000 Units Subcutaneous Q8H  . mouth rinse  15 mL Mouth Rinse BID  . pantoprazole  40 mg Oral Daily  . sodium chloride flush  10-40 mL Intracatheter Q12H   Continuous Infusions: . amiodarone 60 mg/hr (08/18/20 0900)  . milrinone 0.375 mcg/kg/min (08/18/20 0900)  . norepinephrine (LEVOPHED) Adult infusion 2 mcg/min (08/18/20 0900)   PRN Meds:.acetaminophen **OR** acetaminophen, albuterol, ondansetron **OR** ondansetron (ZOFRAN) IV, polyethylene glycol, sodium chloride flush Medications Prior to Admission:  Prior to Admission medications   Medication Sig Start Date End Date Taking? Authorizing Provider  abiraterone acetate (ZYTIGA) 250 MG tablet TAKE 4 TABLETS (1,000 MG TOTAL) BY MOUTH DAILY. TAKE ON AN EMPTY STOMACH 1 HOUR BEFORE OR 2 HOURS AFTER A MEAL Patient taking differently: No sig reported 07/07/20 07/07/21 Yes Derek Jack, MD  aspirin EC 81 MG EC tablet Take 1 tablet (81 mg total) by mouth daily. Swallow whole. 11/01/19  Yes Amin, Ankit Chirag, MD  atorvastatin (LIPITOR) 40 MG tablet Take 1 tablet (40 mg total) by mouth daily. 11/01/19  Yes Amin, Jeanella Flattery, MD  famotidine (PEPCID) 10 MG tablet Take 1 tablet (10 mg total) by mouth daily as needed for heartburn or indigestion. 11/19/19  Yes Love, Ivan Anchors, PA-C  Menthol-Methyl Salicylate (MUSCLE RUB) 10-15 % CREA Apply 1 application topically 2 (two) times daily as needed for muscle pain. To back and hips 11/19/19  Yes Love, Ivan Anchors, PA-C  midodrine (PROAMATINE) 10 MG tablet Take 1 tablet (10 mg total) by mouth 3 (three) times daily with meals. 11/19/19  Yes Love, Ivan Anchors, PA-C  simethicone (MYLICON) 80 MG chewable tablet Chew 1 tablet (80 mg total) by mouth 4 (four) times daily as needed for flatulence. 11/19/19  Yes Love, Ivan Anchors, PA-C  predniSONE (DELTASONE) 5 MG tablet Take 1 tablet (5 mg total) by mouth daily with breakfast. Patient not taking: Reported on  08/14/2020 03/12/20   Derek Jack, MD  predniSONE (DELTASONE) 5 MG tablet TAKE 1 TABLET BY MOUTH DAILY WITH BREAKFAST Patient not taking: Reported on 08/14/2020 01/21/20 01/20/21  Derek Jack, MD   No Known Allergies Review of Systems  Respiratory: Positive for shortness of breath.     Physical Exam Constitutional:      Appearance: He is cachectic. He is ill-appearing.     Interventions: Nasal cannula in place.     Comments: Patient appears generally uncomfortable, he is dyspneic at rest  Cardiovascular:     Rate and Rhythm: Tachycardia present.  Pulmonary:     Effort: Tachypnea present.  Musculoskeletal:     Comments: Generalized weakness and muscle atrophy  Skin:    General: Skin is dry.  Neurological:     Mental Status: He is alert.     Vital Signs: BP (!) 72/60   Pulse (!) 104   Temp (!) 97 F (36.1 C) (Axillary)   Resp (!) 28   Ht 6' (1.829 m)   Wt 67.2 kg   SpO2 97%   BMI 20.10 kg/m  Pain Scale: 0-10   Pain Score: 0-No pain   SpO2: SpO2: 97 % O2 Device:SpO2: 97 % O2 Flow Rate: .O2 Flow Rate (L/min): 5 L/min  IO: Intake/output summary:   Intake/Output Summary (Last 24 hours) at 08/18/2020 1023 Last data filed at 08/18/2020 0900 Gross per 24 hour  Intake 1141.16 ml  Output 500 ml  Net 641.16 ml    LBM: Last BM Date: 08/18/20 Baseline Weight: Weight: 65.7 kg Most recent weight: Weight: 67.2 kg     Palliative Assessment/Data: 30 %    Discussed with Dr. Valeta Harms and bedside RN  Time In: 0900 Time Out: 1015 Time Total: 75 minutes Greater than 50%  of this time was spent counseling and coordinating care related to the above assessment and plan.  Signed by: Wadie Lessen, NP   Please contact Palliative Medicine Team phone at [phone] for questions and concerns.  For individual provider: See Shea Evans

## 2020-08-18 NOTE — Plan of Care (Signed)

## 2020-08-18 NOTE — Progress Notes (Signed)
NAME:  Jeff [name], MRN:  [id number], DOB:  15-Jul-1945, LOS: 4 ADMISSION DATE:  08/14/2020, CONSULTATION DATE:  08/14/20 REFERRING MD:  Dr Eulis Foster, CHIEF COMPLAINT: sob/ cardiogenic shock   History of Present Illness:  75 y/o M, smoker, currently being treated for prostate cancer with mets to the bone with oral chemotherapy. He last had an infusion on 4/14. Presented to ER am 4/21 with onset SOB w/in last 12 hours prior to arrival without chest pain or cough.  He does not wear oxygen at home.  No history of lung problems such as COPD or asthma.  He is not on any blood thinners, no previous history of PE.  Additional history of alcohol and cocaine use, CHF last echo 35%.  He is non-compliant with cardiology clinic follow up   Pertinent  Medical History  Acute renal failure ([place]) (01/26/2014), Alcohol abuse, Anemia of chronic disease (01/26/2014), Bilateral hydronephrosis (01/26/2014), History of cocaine abuse ([place]) (01/26/2014), Homelessness (01/31/2014), Prostate cancer metastatic to multiple sites [facility]) (01/30/2014), Sepsis ([place]), and UTI (lower urinary tract infection).   Significant Hospital Events: Including procedures, antibiotic start and stop dates in addition to other pertinent events    4/21 Admit with acute SOB. ECHO 4/21 EF 10%, Grade III diastolic dysfunction / mod LA RA enlargement, PAS est 63 and RA 15. V/Q scan with low probability for PE.  LE dopplers negative bilaterally  4/21 BCx2 >>   4/23 Remains on inotropes   Interim History / Subjective:  Remains on inotropes, little urine output. Severely depressed coox of 36 Slowly elevating serum creatinine.  Objective   Blood pressure (!) 83/58, pulse (!) 102, temperature (!) 97 F (36.1 C), temperature source Axillary, resp. rate (!) 23, height 6' (1.829 m), weight 67.2 kg, SpO2 97 %. CVP:  [9 mmHg-26 mmHg] 17 mmHg      Intake/Output Summary (Last 24 hours) at 08/18/2020 1108 Last data filed at 08/18/2020 1000 Gross per 24  hour  Intake 1189.03 ml  Output 600 ml  Net 589.03 ml   Filed Weights   08/16/20 0255 08/17/20 0310 08/18/20 0500  Weight: 65.3 kg 65.3 kg 67.2 kg   Physical Exam General: Elderly frail male lying in bed HEENT: Mucous membranes moist Neuro: Alert oriented following commands moves all 4 extremities CV: Regular rate rhythm, S1-S2, distant heart tones PULM: Nonlabored breathing, no crackles diminished in the bases bilaterally GI: Soft, mildly distended Extremities: No significant edema Skin: No rash   Assessment & Plan:   # Cardiogenic shock with pulmonary edema causing hypoxemia, acute renal injury, acute liver injury, and lactic acidemia # Severe protein calorie malnutrition # Hx of CVA # Metastatic prostate cancer to bone on androgen deprivation therapy # Hx of polysubstance abuse including cocaine, none recent # Sepsis ruled out # Encounter for palliative care, approaching end of life # Questionable Afib, tele here shows none # Hyponatremia   # AKI in setting of Shock, cardiorenal syndrome # Hepatic congestion, from biventricular failure # Anemia  #DNR, hospice comfort care transition  This gentleman unfortunately has advanced cardiomyopathy with no other alternatives. Remains in cardiogenic shock despite inotropes and pressors. Discussed today at bedside patient's wishes and goals. He agrees to transition to hospice care. Palliative care is at bedside.  We discussed plan. We will transition patient to comfort care measures.  He would like to have his friend visit if at all possible. We appreciate help amongst multiple care teams.   Best Practice   Code Status: DNR, transition  to comfort care measures Goals of Care meeting: pending DVT: heparin subQ L/T/D: has a port. Condom cath  VAP : n/a PAD: n/a GI: n/a Diet: cardiac     Garner Nash, DO [facility] 08/18/2020 11:08 AM

## 2020-08-18 NOTE — Progress Notes (Addendum)
Patient ID: Jeff [name], male   DOB: 06-Aug-1945, 75 y.o.   MRN: [id number]     Advanced Heart Failure Rounding Note  PCP-Cardiologist: Carlyle Dolly, MD   Subjective:    On milrinone 0.375 + norepi 2 + amio 60.   Diuresed with IV lasix. Minimal urine output.   CO-OX 36%. Creatinine rising 3.5.   Remains short of breath.  Objective:   Weight Range: 67.2 kg Body mass index is 20.1 kg/m.   Vital Signs:   Temp:  [96.9 F (36.1 C)-98.4 F (36.9 C)] 96.9 F (36.1 C) (04/24 1558) Pulse Rate:  [79-141] 98 (04/25 0630) Resp:  [16-51] 39 (04/25 0630) BP: (73-110)/(53-99) 93/77 (04/25 0630) SpO2:  [82 %-100 %] 95 % (04/25 0630) Weight:  [67.2 kg] 67.2 kg (04/25 0500) Last BM Date: 08/17/20  Weight change: Filed Weights   08/16/20 0255 08/17/20 0310 08/18/20 0500  Weight: 65.3 kg 65.3 kg 67.2 kg    Intake/Output:   Intake/Output Summary (Last 24 hours) at 08/18/2020 0712 Last data filed at 08/18/2020 0600 Gross per 24 hour  Intake 1093.93 ml  Output 500 ml  Net 593.93 ml      Physical Exam   CVP 15-16  General:  Appears weak. No resp difficulty HEENT: normal Neck: supple.JVP to jaw  Carotids 2+ bilat; no bruits. No lymphadenopathy or thryomegaly appreciated. Cor: PMI nondisplaced. Tachy irregular rate & rhythm. No rubs, gallops. + S3 . Lungs: clear Abdomen: soft, nontender, nondistended. No hepatosplenomegaly. No bruits or masses. Good bowel sounds. Extremities: no cyanosis, clubbing, rash, edema Neuro: alert & orientedx3, cranial nerves grossly intact. moves all 4 extremities w/o difficulty. Affect pleasant   Telemetry   Sinus Tach frequent PVCs/NSVT  Labs    CBC Recent Labs    08/17/20 0309 08/18/20 0432  WBC 7.9 PENDING  HGB 10.9* 10.4*  HCT 33.0* 31.7*  MCV 93.8 92.7  PLT 184 557   Basic Metabolic Panel Recent Labs    08/17/20 1234 08/18/20 0432  NA 134* 132*  K 4.0 4.0  CL 103 103  CO2 18* 17*  GLUCOSE 151* 120*  BUN 46* 48*   CREATININE 2.93* 3.55*  CALCIUM 7.4* 7.0*   Liver Function Tests Recent Labs    08/17/20 0309 08/18/20 0432  AST 106* 77*  ALT 117* 92*  ALKPHOS 179* 187*  BILITOT 1.6* 1.0  PROT 6.9 6.7  ALBUMIN 2.9* 2.8*   No results for input(s): LIPASE, AMYLASE in the last 72 hours. Cardiac Enzymes No results for input(s): CKTOTAL, CKMB, CKMBINDEX, TROPONINI in the last 72 hours.  BNP: BNP (last 3 results) Recent Labs    08/14/20 1208  BNP >4,500.0*    ProBNP (last 3 results) No results for input(s): PROBNP in the last 8760 hours.   D-Dimer No results for input(s): DDIMER in the last 72 hours. Hemoglobin A1C No results for input(s): HGBA1C in the last 72 hours. Fasting Lipid Panel No results for input(s): CHOL, HDL, LDLCALC, TRIG, CHOLHDL, LDLDIRECT in the last 72 hours. Thyroid Function Tests No results for input(s): TSH, T4TOTAL, T3FREE, THYROIDAB in the last 72 hours.  Invalid input(s): FREET3  Other results:   Imaging    No results found.   Medications:     Scheduled Medications: . aspirin EC  81 mg Oral Daily  . Chlorhexidine Gluconate Cloth  6 each Topical Daily  . furosemide  80 mg Intravenous BID  . heparin  5,000 Units Subcutaneous Q8H  . pantoprazole  40 mg Oral Daily  .  sodium chloride flush  10-40 mL Intracatheter Q12H    Infusions: . amiodarone 60 mg/hr (08/18/20 0600)  . milrinone 0.375 mcg/kg/min (08/18/20 0610)  . norepinephrine (LEVOPHED) Adult infusion 2 mcg/min (08/18/20 0600)    PRN Medications: acetaminophen **OR** acetaminophen, albuterol, ondansetron **OR** ondansetron (ZOFRAN) IV, polyethylene glycol, sodium chloride flush  Assessment/Plan   1. Acute/chronic systolic CHF/cardiogenic shock:  Echo in 7/21 with EF 35%.  Echo this admission with EF 10-15%, mild LV dilation, moderate-severely decreased RV systolic function with mild RVE, at least moderate MR, mild AI, IVC dilated. Suspect cardiogenic shock with low co-ox.  Cause of CMP  uncertain, has history of cocaine abuse though cocaine negative this admission.  Never had ischemic workup. On  milrinone 0.375 with co-ox 36%.   Creatinine up to 3.55 - Continue milrinone 0.375 and continue norepinephrine 2    - Continue amiodarone gtt to control rate (?MAT runs versus AT).   - Continue IV lasix .    - If improves, will need RHC/LHC.  - Not candidate for advanced therapies with poor compliance, h/o drug abuse, and metastatic prostate CA.  It looks like he has end stage HF at this point, would not institute temporary mechanical support like Impella with no end point options (LVAD). Palliative Care consulted. He is DNR.  2. AKI on CKD stage 3: Suspect due to cardiogenic shock.  Creatinine up to 3.55 this morning with rising CVP.  - Support MAP/CO with addition of norepinephrine.  3. Elevated HS-TnI: Possible demand ischemia with cardiogenic shock.  Did not trend upward to suggest ACS.  - Continue ASA 81.  4. Elevated LFTs: Mild, suspect shock liver.  5. Rhythm: Initially was having MAT runs, now appears to be in atrial fibrillation.  - Continue amiodarone gtt for now.  - DVT dose heparin 6. ID: Off antibiotics.   7. Prostate CA: Extensive bony mets.  He is DNR.  - On Zytiga at home.  8. H/o cocaine abuse: Negative UDS this admission.   Palliative Care consulted. As above not a candidate for advanced therapies. With MSOF would favor Hospice.   Length of Stay: 4  Amy Clegg, NP  08/18/2020, 7:12 AM  Advanced Heart Failure Team Pager [phone] (M-F; 7a - 5p)  Please contact [facility] for night-coverage after hours (5p -7a ) and weekends on [email or web address]  Patient seen with NP, agree with the above note.   He is lying in recliner, looks tachypneic but denies dyspnea.  Poor UOP, co-ox remains low 36%.  CVP 13-14.  He appears to have gone into atrial fibrillation with rate in the 100s.   General: NAD Neck: JVP 12 cm, no thyromegaly or thyroid nodule.  Lungs: Clear to  auscultation bilaterally with normal respiratory effort. CV: Lateral PMI.  Heart irregular S1/S2, no S3/S4, no murmur.  No peripheral edema.   Abdomen: Soft, nontender, no hepatosplenomegaly, no distention.  Skin: Intact without lesions or rashes.  Neurologic: Alert and oriented x 3.  Psych: Normal affect. Extremities: No clubbing or cyanosis.  HEENT: Normal.   Patient remains on milrinone 0.375 and NE 2.  Attempts to increase milrinone or NE have been met by marked rise in HR (to 150s).  Progressive AKI with cardiogenic shock.  Co-ox remains low on dual inotropes.  As above, he is not a candidate for LVAD with metastatic cancer, renal failure, and social situation.  Would not place temporary mechanical support with no good end point. We unfortunately do not have further options to improve  his situation.  I have spoken to him about hospice palliative/care.  - Need palliative care service to see today.  Currently DNR, think he would be appropriate for hospice care.  - Continue current milrinone and NE for now.  - Lasix 80 mg IV bid (though has not responded well with cardiogenic shock and progressive renal failure).  - Continue amiodarone gtt to rate-control atrial fibrillation.   CRITICAL CARE Performed by: Loralie Champagne  Total critical care time: 35 minutes  Critical care time was exclusive of separately billable procedures and treating other patients.  Critical care was necessary to treat or prevent imminent or life-threatening deterioration.  Critical care was time spent personally by me on the following activities: development of treatment plan with patient and/or surrogate as well as nursing, discussions with consultants, evaluation of patient's response to treatment, examination of patient, obtaining history from patient or surrogate, ordering and performing treatments and interventions, ordering and review of laboratory studies, ordering and review of radiographic studies, pulse  oximetry and re-evaluation of patient's condition.  Loralie Champagne 08/18/2020 7:42 AM

## 2020-08-19 DIAGNOSIS — C7951 Secondary malignant neoplasm of bone: Secondary | ICD-10-CM

## 2020-08-19 DIAGNOSIS — N1832 Chronic kidney disease, stage 3b: Secondary | ICD-10-CM

## 2020-08-19 DIAGNOSIS — C61 Malignant neoplasm of prostate: Secondary | ICD-10-CM

## 2020-08-19 LAB — CULTURE, BLOOD (ROUTINE X 2)
Culture: NO GROWTH
Culture: NO GROWTH
Special Requests: ADEQUATE
Special Requests: ADEQUATE

## 2020-08-19 LAB — RESP PANEL BY RT-PCR (FLU A&B, COVID) ARPGX2
Influenza A by PCR: NEGATIVE
Influenza B by PCR: NEGATIVE
SARS Coronavirus 2 by RT PCR: NEGATIVE

## 2020-08-19 MED ORDER — FUROSEMIDE 20 MG PO TABS: 60.0000 mg | ORAL_TABLET | Freq: Every day | ORAL | 0 refills | Status: AC

## 2020-08-19 MED ORDER — FUROSEMIDE 40 MG PO TABS
60.0000 mg | ORAL_TABLET | Freq: Every day | ORAL | Status: DC
  Administered 2020-08-19: 60 mg via ORAL
  Filled 2020-08-19: qty 1

## 2020-08-19 NOTE — Progress Notes (Signed)
Pt picked up by [facility] for transfer to [facility].

## 2020-08-19 NOTE — TOC Initial Note (Addendum)
Transition of Care (TOC) - Initial/Assessment Note  Heart Failure   Patient Details  Name: Jeff [name] MRN: [id number] Date of Birth: Apr 20, 1946  Transition of Care [facility]) CM/SW Contact:    [place], [place] Phone Number: 08/19/2020, 10:34 AM  Clinical Narrative:                 CSW spoke with Jeff Curet., NP with palliative who indicated that Jeff [name] will need residential hospice in [facility] and that the patient doesn't have much family support but that a friend Jeff [name] [phone]) is the POA, and she has been in communication with them. CSW reached out to Jeff [name] and put in a referral for residential hospice with Jeff [name] [phone] who indicated she will take a look in the hub and give the CSW a call back after reviewing the patient.   11:25am: CSW received a call back from Jeff [name] at [facility] [phone]) reporting that they have a bed available for the patient today and they would need Jeff [name] signature and a negative COVID test to initiate that process as they do not hold beds for patients. CSW messaged the attending doctor for the COVID test and obtained Jeff [name] signature.   1:00pm: CSW called Jeff [name] [phone]) to obtain permission to add him to the patient's hospice paperwork and Jeff [name] agreed and reported that he is the POA but will not provide paperwork at this time as he was expecting to hear from the doctor regarding the patients transition to hospice and why the patient isn't being provided comfort care at the hospital till end of life. CSW encouraged Jeff [name] to reach out to the hospital for inquiry.  1:10pm: CSW sent over the signed paperwork to hospice and called to verify they received the paperwork however CSW had to leave a voicemail for them to return the call.  TOC will continue to follow for d/c needs.   Expected Discharge Plan: [place] Barriers to  Discharge: Continued Medical Work up   Patient Goals and CMS Choice        Expected Discharge Plan and Services Expected Discharge Plan: [place] In-house Referral: Clinical Social Work   Post Acute Care Choice: Hospice                                        Prior Living Arrangements/Services     Patient language and need for interpreter reviewed:: Yes        Need for Family Participation in Patient Care: Yes (Comment) (patients friend Jeff [name]) Care giver support system in place?: No (comment)   Criminal Activity/Legal Involvement Pertinent to Current Situation/Hospitalization: No - Comment as needed  Activities of Daily Living Home Assistive Devices/Equipment: None ADL Screening (condition at time of admission) Patient's cognitive ability adequate to safely complete daily activities?: Yes Is the patient deaf or have difficulty hearing?: No Does the patient have difficulty seeing, even when wearing glasses/contacts?: No Does the patient have difficulty concentrating, remembering, or making decisions?: No Patient able to express need for assistance with ADLs?: Yes Does the patient have difficulty dressing or bathing?: No Independently performs ADLs?: No Does the patient have difficulty walking or climbing stairs?: No Weakness of Legs: None Weakness of Arms/Hands: None  Permission Sought/Granted                  Emotional  Assessment Appearance:: Appears stated age Attitude/Demeanor/Rapport: Unable to Assess Affect (typically observed): Unable to Assess   Alcohol / Substance Use: Not Applicable Psych Involvement: No (comment)  Admission diagnosis:  Respiratory distress [R06.03] Leg swelling [M79.89] Hypoxia [R09.02] Elevated troponin [R77.8] AKI (acute kidney injury) ([place]) [N17.9] Acute on chronic systolic CHF (congestive heart failure) (HCC) [I50.23] Acute on chronic congestive heart failure, unspecified heart failure type  [facility]) [I50.9] Patient Active Problem List   Diagnosis Date Noted  . Acute on chronic combined systolic and diastolic CHF (congestive heart failure)-EF 10 %/Grade 2 DD 08/15/2020  . SIRS (systemic inflammatory response syndrome) ([place]) 08/15/2020  . Cardiogenic shock ([place]) 08/14/2020  . Acute respiratory failure with hypoxemia ([place]) 08/14/2020  . Acute on chronic systolic CHF (congestive heart failure) ([place]) 08/14/2020  . Lactic acidosis 08/14/2020  . Elevated troponin 08/14/2020  . Acute on chronic congestive heart failure ([place])   . Goals of care, counseling/discussion 01/21/2020  . Chronic combined systolic and diastolic congestive heart failure ([place])   . 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 Hypotension   . Acute ischemic stroke ([place]) 10/26/2019  . Cocaine abuse ([place]) 10/26/2019  . CKD (chronic kidney disease) stage 3, GFR 30-59 ml/min (HCC) 10/26/2019  . Acute metabolic encephalopathy 72/12/4707  . Prostate cancer metastatic to bone ([place]) 10/26/2019  . CVA (cerebral vascular accident) ([place]) 10/25/2019  . Port or reservoir infection   . B12 deficiency 04/01/2016  . Malnutrition of moderate degree ([place]) 05/24/2014  . Bacteremia 05/24/2014  . Pyrexia   . Sepsis ([place]) 05/23/2014  . UTI (lower urinary tract infection) 05/23/2014  . Health care maintenance 02/06/2014  . Homelessness 01/31/2014  . Malignant neoplasm of prostate ([place]) 01/30/2014  . Bone lesion 01/26/2014  . Acute renal failure ([place]) 01/26/2014  . Bilateral hydronephrosis 01/26/2014  . Anemia of chronic disease 01/26/2014  . History of cocaine abuse ([place]) 01/26/2014   PCP:  Patient, No Pcp Per (Inactive) Pharmacy:   [facility] [place], [address] [place] [address] Phone: [phone] Fax: [phone]  [facility] [address] [place] [address] Phone: [phone]  Fax: [phone]  [facility] [place], [address] [place] [address] Phone: [phone] Fax: [place] [address] Phone: [phone] Fax: [phone]     Social Determinants of Health (SDOH) Interventions    Readmission Risk Interventions No flowsheet data found.  Nihal Marzella, MSW, [place] Heart Failure Social Worker

## 2020-08-19 NOTE — Progress Notes (Addendum)
Patient ID: Jeff [name], male   DOB: 02-12-1946, 75 y.o.   MRN: [id number]     Advanced Heart Failure Rounding Note  PCP-Cardiologist: Carlyle Dolly, MD   Subjective:    Saw Palliative Care. Now off all meds/labs. Transitioning to comfort care. Started on morphine/ativan as needed.   No complaints.   Objective:   Weight Range: 65.4 kg Body mass index is 19.55 kg/m.   Vital Signs:   Temp:  [97 F (36.1 C)-98 F (36.7 C)] 98 F (36.7 C) (04/25 2000) Pulse Rate:  [95-119] 112 (04/25 2000) Resp:  [12-42] 15 (04/26 0516) BP: (72-152)/(58-122) 96/74 (04/26 0516) SpO2:  [82 %-100 %] 93 % (04/26 0516) Weight:  [65.4 kg] 65.4 kg (04/26 0516) Last BM Date: 08/18/20  Weight change: Filed Weights   08/17/20 0310 08/18/20 0500 08/19/20 0516  Weight: 65.3 kg 67.2 kg 65.4 kg    Intake/Output:   Intake/Output Summary (Last 24 hours) at 08/19/2020 0734 Last data filed at 08/19/2020 0500 Gross per 24 hour  Intake 345.21 ml  Output 725 ml  Net -379.79 ml      Physical Exam   General:Appears frail. No resp difficulty HEENT: normal Neck: supple. no JVD. Carotids 2+ bilat; no bruits. No lymphadenopathy or thryomegaly appreciated. Cor: PMI nondisplaced. Regular rate & rhythm. No rubs, gallops or murmurs.. L subclavian central line.  Lungs: clear Abdomen: soft, nontender, nondistended. No hepatosplenomegaly. No bruits or masses. Good bowel sounds. Extremities: no cyanosis, clubbing, rash, edema Neuro: alert & orientedx3, cranial nerves grossly intact. moves all 4 extremities w/o difficulty. Affect flat    Telemetry   SR 90s with occasional PVCs.   Labs    CBC Recent Labs    08/17/20 0309 08/18/20 0432  WBC 7.9 8.0  HGB 10.9* 10.4*  HCT 33.0* 31.7*  MCV 93.8 92.7  PLT 184 627   Basic Metabolic Panel Recent Labs    08/17/20 1234 08/18/20 0432  NA 134* 132*  K 4.0 4.0  CL 103 103  CO2 18* 17*  GLUCOSE 151* 120*  BUN 46* 48*  CREATININE 2.93* 3.55*   CALCIUM 7.4* 7.0*   Liver Function Tests Recent Labs    08/17/20 0309 08/18/20 0432  AST 106* 77*  ALT 117* 92*  ALKPHOS 179* 187*  BILITOT 1.6* 1.0  PROT 6.9 6.7  ALBUMIN 2.9* 2.8*   No results for input(s): LIPASE, AMYLASE in the last 72 hours. Cardiac Enzymes No results for input(s): CKTOTAL, CKMB, CKMBINDEX, TROPONINI in the last 72 hours.  BNP: BNP (last 3 results) Recent Labs    08/14/20 1208  BNP >4,500.0*    ProBNP (last 3 results) No results for input(s): PROBNP in the last 8760 hours.   D-Dimer No results for input(s): DDIMER in the last 72 hours. Hemoglobin A1C No results for input(s): HGBA1C in the last 72 hours. Fasting Lipid Panel No results for input(s): CHOL, HDL, LDLCALC, TRIG, CHOLHDL, LDLDIRECT in the last 72 hours. Thyroid Function Tests No results for input(s): TSH, T4TOTAL, T3FREE, THYROIDAB in the last 72 hours.  Invalid input(s): FREET3  Other results:   Imaging    No results found.   Medications:     Scheduled Medications: . Chlorhexidine Gluconate Cloth  6 each Topical Daily  . mouth rinse  15 mL Mouth Rinse BID  . sodium chloride flush  10-40 mL Intracatheter Q12H    Infusions:   PRN Medications: acetaminophen **OR** acetaminophen, albuterol, LORazepam, morphine injection, ondansetron **OR** ondansetron (ZOFRAN) IV, polyethylene glycol, sodium  chloride flush  Assessment/Plan   1. Acute/chronic systolic CHF/cardiogenic shock:  Echo in 7/21 with EF 35%.  Echo this admission with EF 10-15%, mild LV dilation, moderate-severely decreased RV systolic function with mild RVE, at least moderate MR, mild AI, IVC dilated. Suspect cardiogenic shock with low co-ox.  Cause of CMP uncertain, has history of cocaine abuse though cocaine negative this admission.  Never had ischemic workup.  - Transitioning to comfort care.  - Off all drips.  2. AKI on CKD stage 3: Suspect due to cardiogenic shock. 3. Elevated HS-TnI: Possible demand  ischemia with cardiogenic shock.  Did not trend upward to suggest ACS.  4. Elevated LFTs: Mild, suspect shock liver.  5. Rhythm: SR today.   6. ID: Off antibiotics.   7. Prostate CA: Extensive bony mets.  He is DNR.  - On Zytiga at home.  8. H/o cocaine abuse: Negative UDS this admission.   He is off all scheduled medications/labs.  Transitioning to comfort care.   HF Team will sign off today.   Length of Stay: 5  Amy Clegg, NP  08/19/2020, 7:34 AM  Advanced Heart Failure Team Pager [phone] (M-F; 7a - 5p)  Please contact [facility] for night-coverage after hours (5p -7a ) and weekends on [email or web address]  Patient seen with NP, agree with the above note.   Patient is off all cardiac meds now.  He is transitioning to comfort care.  Appreciate palliative care team.   We will sign off today.   Loralie Champagne 08/19/2020 7:46 AM

## 2020-08-19 NOTE — Progress Notes (Signed)
    Progress Note from the Palliative Medicine Team at [facility]   Patient Name: Jeff [name]       Date: 08/19/2020 DOB: 1946-04-10  Age: 75 y.o. MRN#: [id number] Attending Physician: Tawni Millers Primary Care Physician: Patient, No Pcp Per (Inactive) Admit Date: 08/14/2020   Medical records reviewed   75 y/o M, smoker, currently being treated for prostate cancer with mets to the bone with oral chemotherapy. He last had an infusion on 4/14. Presented to ER am 4/21 with onset SOB w/in last 12 hours prior to arrival without chest pain or cough. He does not wear oxygen at home. No history of lung problems such as COPD or asthma. He is not on any blood thinners, no previous history of PE.  Additional history of alcohol and cocaine use, CHFlast echo 35%.  He is non-compliant with cardiology clinic follow up according to EMR.     He made decision to shift to full comfort, allowing for a natrual death.  Hope is for residential hospice facility for EOL care.    Plan of Care: Patient is weaker today, very little oral intake Focus of care is comfort and dignity allowing for a natural death.  Patient is comfortable with his decision.   -DNR/DNI -No artificial feeding/hydration now or in the future -No further diagnostics or life prolonging measures -Symptom management specific to dyspnea, pain and agitation -Referral to residential hospice of [facility], prognosis is less than 2 weeks  MOST form completed  Questions and concerns addressed   Discussed with attending and TOC team.      Total time spent on the unit was 25 minutes  Greater than 50% of the time was spent in counseling and coordination of care  Wadie Lessen NP  Palliative Medicine Team Team Phone # [phone] Pager [phone]

## 2020-08-19 NOTE — Progress Notes (Addendum)
This chaplain attempted PMT consult for EOL spiritual care.  The Pt. is sleeping at the time of the chaplain's visit. This chaplain will F/U and continue to communicate with Palliative Care.  **1420  This chaplain joined the Pt. at the bedside.  The chaplain offered a listening presence and a little encouragement for the Pt. to tell his story and affirm himself through his talents and being.  The Pt. is tearful as we speak of death and  giving himself permission to be emotional.    The Pt. Agreed to F/U spiritual care tomorrow if he has not transitioned to [facility].

## 2020-08-19 NOTE — TOC Transition Note (Addendum)
Transition of Care [facility]) - CM/SW Discharge Note Heart Failure   Patient Details  Name: Jeff [name] MRN: [id number] Date of Birth: February 12, 1946  Transition of Care [facility]) CM/SW Contact:  [place], [place] Phone Number: 08/19/2020, 3:14 PM   Clinical Narrative:    Patient will DC to: [facility] Anticipated DC date: 08/19/2020 Family notified: Patient has no family friend [name] is aware Transport by: Corey Harold   Per MD patient ready for DC to [facility]. RN to call report prior to discharge and please provide COVID test results also [phone]). RN, patient, patient's friend, and facility notified of DC. DC packet on chart with DNR and Most Form. COVID test results are back. Ambulance transport requested for patient [facility] [phone]).  CSW will sign off for now as social work intervention is no longer needed. Please consult [place] again if new needs arise.      Final next level of care: [place] Barriers to Discharge: No Barriers Identified   Patient Goals and CMS Choice        Discharge Placement              Patient [facility] bed at:  [facility]) Patient to be transferred to facility by: [facility]   Patient and family notified of of transfer: 08/19/20  Discharge Plan and Services In-house Referral: Clinical Social Work   Post Acute Care Choice: Hospice                               Social Determinants of Health (SDOH) Interventions     Readmission Risk Interventions No flowsheet data found.  Betzaida Cremeens, MSW, [place] Heart Failure Social Worker

## 2020-08-19 NOTE — Discharge Summary (Addendum)
Physician Discharge Summary  Jeff [name] [id number] DOB: 1945-06-12 DOA: 08/14/2020  PCP: Patient, No Pcp Per (Inactive)  Admit date: 08/14/2020 Discharge date: 08/19/2020  Admitted From: Home  Disposition:   Residential hospice.   Recommendations for Outpatient Follow-up and new medication changes:  1. Follow up with Hospice team.  2. Patient did not respond to aggressive medical therapy and not candidate for advanced interventions.  3. Furosemide 60 mg daily for palliative volume control.   Home Health: na   Equipment/Devices: na    Discharge Condition: stable  CODE STATUS: DNR   Diet recommendation:  As tolerated..  Brief/Interim Summary: [name] was admitted to the hospital with the working diagnosis of acute on chronic systolic heart failure complicated with cardiogenic shock.   75 year old male past medical history for systolic heart failure, prostate cancer who presented with dyspnea.  Acute onset of symptoms, severe in intensity, that prompted him to come to the hospital.  On his initial physical examination he was in respiratory distress, blood pressure 121/96, heart rate 89, respiratory rate of 30, oxygen saturation 91% on nonrebreather mask.  He had increased work of breathing, no wheezing, heart S1-S2, present, tachycardic, abdomen soft nontender, no frank lower extremity edema.   Sodium 136, potassium 3.6, chloride 104, bicarb 12, glucose 106, BUN 53, creatinine 2.68, anion gap 28, AST 106, ALT 66, total bilirubin 2.5, BNP > 4500, high sensitive troponin 194-199, lactic acid 7.9, white count 9.3, hemoglobin 11.6, hematocrit 35.7, platelets 205. Venous blood gas pH 7.35. SARS COVID-19 negative. Urinalysis specific gravity 1.006, 11-20 white cells, 0-5 red cells, negative protein.  Chest radiograph with mild cardiomegaly, right base atelectasis, bilateral interstitial infiltrates, homogeneous pattern.  EKG 135 bpm, left axis deviation, normal intervals, sinus  rhythm with PAC and PVC, Q-wave lead II, lead III, aVF, V4-V6, no significant ST segment or T wave changes.  Patient was [facility] to [facility] for advanced heart failure therapy.  Admitted to the intensive care unit with working diagnosis of low cardiac output heart failure, cardiogenic shock.  Patient was placed initially on dobutamine for inotropic support along with intravenous furosemide for diuresis.  Further work-up with echocardiography showed a significant reduction in his LV systolic function down to 10 to 15% with mild LV dilatation, severe reduction in RV systolic pressure with moderate mitral regurgitation.  He was transitioned to milrinone and norepinephrine.  Unfortunately patient failed aggressive medical therapy with progressive renal failure and hypotension.  Patient was transitioned to comfort care, currently pending to be transferred to residential hospice.  1. Acute systolic heart failure complicated with cardiogenic shock/ acute cardiogenic pulmonary edema with acute hypoxemic respiratory failure. Unfortunately patient has not tolerated well aggressive medical therapy with 2 inotropic agents, milrinone and norepinephrine. His volume has improved with furosemide.  He is not candidate for advanced therapies due to poor compliance, history of drug abuse and metastatic prostate cancer.  Patient with low output heart failure, worsening kidney injury and elevated liver enzymes, liver shock.  Continue with furosemide for symptomatic volume control.    Patient will be discharged to residential hospice.  2.  Atrial tachycardia with signs of multifocal atrial tachycardia/atrial fibrillation.  Patient received amiodarone infusion for rate control during his hospitalization.  3.  Acute kidney injury on chronic kidney disease stage III/ lactic acidosis.  Despite patient receiving aggressive diuresis with intravenous furosemide, hemodynamic support  with 2 inotropes, he continued to have worsening kidney function.  Discharge creatinine 3.55.  His volume  seems to be stable today, will add furosemide 60 mg to take daily for volume control.  4.  Prostate cancer with extensive bony metastasis/retroperitoneal adenopathy.  Patient has been followed by the endocrine cancer center. He has been on Abiraterone 1000 mg daily and prednisone 5 mg daily.  5.  History of cocaine abuse.  No signs of withdrawals.  6.  History of CVA.  Patient on aspirin and atorvastatin as an outpatient.   Discharge Diagnoses:  Principal Problem:   Acute on chronic combined systolic and diastolic CHF (congestive heart failure)-EF 10 %/Grade 2 DD Active Problems:   Acute renal failure (HCC)   Malignant neoplasm of prostate (HCC)   CKD (chronic kidney disease) stage 3, GFR 30-59 ml/min (HCC)   Prostate cancer metastatic to bone [facility])   Cardiogenic shock (HCC)   Acute respiratory failure with hypoxemia (HCC)   Lactic acidosis   Elevated troponin    Discharge Instructions   Allergies as of 08/19/2020   No Known Allergies     Medication List    STOP taking these medications   abiraterone acetate 250 MG tablet Commonly known as: ZYTIGA   aspirin 81 MG EC tablet   atorvastatin 40 MG tablet Commonly known as: LIPITOR   famotidine 10 MG tablet Commonly known as: PEPCID   midodrine 10 MG tablet Commonly known as: PROAMATINE   Muscle Rub 10-15 % Crea   predniSONE 5 MG tablet Commonly known as: DELTASONE   simethicone 80 MG chewable tablet Commonly known as: MYLICON     TAKE these medications   furosemide 20 MG tablet Commonly known as: LASIX Take 3 tablets (60 mg total) by mouth daily.       No Known Allergies  Consultations:  Cardiology   PCCM  Palliative Care   Procedures/Studies: DG Chest 1 View  Result Date: 08/14/2020 CLINICAL DATA:  Short of breath EXAM: CHEST  1 VIEW COMPARISON:  CT chest 08/03/2019 FINDINGS: Left  chest wall port a catheter noted with tip in the distal SVC. Mild cardiac enlargement. Small bilateral pleural effusions and pulmonary vascular congestion identified. Chronic interstitial coarsening of COPD/emphysema noted. Diffuse sclerotic bone metastases are noted. IMPRESSION: 1. Cardiac enlargement and CHF. 2. Diffuse sclerotic bone metastases. Electronically Signed   By: Kerby Moors M.D.   On: 08/14/2020 13:05   NM Pulmonary Perfusion  Result Date: 08/14/2020 CLINICAL DATA:  Shortness of breath for 1 day EXAM: NUCLEAR MEDICINE PERFUSION LUNG SCAN TECHNIQUE: Perfusion images were obtained in multiple projections after intravenous injection of radiopharmaceutical. Ventilation scans intentionally deferred if perfusion scan and chest x-ray adequate for interpretation during COVID 19 epidemic. RADIOPHARMACEUTICALS:  4.4 mCi Tc-57m MAA IV COMPARISON:  None Correlation: Chest radiograph 08/14/2020 FINDINGS: Thickening of the RIGHT fissures and minutes perfusion in LEFT lower lobe likely related to RIGHT pleural effusion. Artifact in LEFT upper lobe on LAO view, due to attenuation by patient's arm. Inhomogeneous perfusion throughout both lungs corresponding to infiltrates and RIGHT pleural effusion seen radiographically. No definite evidence of pulmonary embolism identified. IMPRESSION: Nonsegmental perfusion changes related to pleural effusion and BILATERAL pulmonary infiltrates. No definite evidence of pulmonary embolism. Electronically Signed   By: Lavonia Dana M.D.   On: 08/14/2020 16:53   US Venous Img Lower Bilateral (DVT)  Result Date: 08/14/2020 CLINICAL DATA:  Leg swelling and shortness of breath. EXAM: Bilateral LOWER EXTREMITY VENOUS DOPPLER ULTRASOUND TECHNIQUE: Gray-scale sonography with compression, as well as color and duplex ultrasound, were performed to evaluate the deep venous system(s) from  the level of the common femoral vein through the popliteal and proximal calf veins. COMPARISON:  None  FINDINGS: VENOUS Normal compressibility of the common femoral, superficial femoral, and popliteal veins, as well as the visualized calf veins. Visualized portions of profunda femoral vein and great saphenous vein unremarkable. No filling defects to suggest DVT on grayscale or color Doppler imaging. Doppler waveforms show normal direction of venous flow, normal respiratory plasticity and response to augmentation. OTHER None. Limitations: none IMPRESSION: Negative sonographic assessment for bilateral lower extremity DVT. Electronically Signed   By: Zetta Bills M.D.   On: 08/14/2020 18:02   ECHOCARDIOGRAM COMPLETE  Result Date: 08/14/2020    ECHOCARDIOGRAM REPORT   Patient Name:   Jeff [name] Date of Exam: 08/14/2020 Medical Rec #:  [id number]       Height:       66.0 in Accession #:    [id number]      Weight:       152.6 lb Date of Birth:  1945/07/28       BSA:          1.783 m Patient Age:    38 years        BP:           102/80 mmHg Patient Gender: M               HR:           107 bpm. Exam Location:  [facility] Procedure: 2D Echo Indications:    Dyspnea R06.00  History:        Patient has prior history of Echocardiogram examinations, most                 recent 10/26/2019. Stroke, Signs/Symptoms:Bacteremia; Risk                 Factors:Current Smoker. Cocaine Abuse, Sepsis, Acute Renal                 Failure.  Sonographer:    Leavy Cella RDCS (AE) Referring Phys: [id number] [place]  1. Left ventricular ejection fraction, by estimation, is 10-15%. The left ventricle has severely decreased function. The left ventricle demonstrates global hypokinesis. The left ventricular internal cavity size was mildly dilated. Left ventricular diastolic parameters are consistent with Grade III diastolic dysfunction (restrictive). Elevated left atrial pressure.  2. Right ventricular systolic function moderately to severely reduced. The right ventricular size is mildly enlarged.  3. Left atrial size was  moderately dilated.  4. Right atrial size was moderately dilated.  5. The pericardial effusion is circumferential. Moderate pleural effusion in the left lateral region.  6. MR is eccentric and may be underestimated. . The mitral valve is abnormal. Moderate mitral valve regurgitation. No evidence of mitral stenosis.  7. The tricuspid valve is abnormal. Tricuspid valve regurgitation is moderate.  8. The aortic valve is tricuspid. There is mild calcification of the aortic valve. There is mild thickening of the aortic valve. Aortic valve regurgitation is mild. No aortic stenosis is present.  9. 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 10. The inferior vena cava is dilated in size with <50% respiratory variability, suggesting right atrial pressure of 15 mmHg. FINDINGS  Left Ventricle: Left ventricular ejection fraction, by estimation, is 10-15%. The left ventricle has severely decreased function. The left ventricle demonstrates global hypokinesis. The left ventricular internal cavity size was mildly dilated. There is no left ventricular hypertrophy. Left ventricular diastolic parameters are consistent with  Grade III diastolic dysfunction (restrictive). Elevated left atrial pressure. Right Ventricle: The right ventricular size is mildly enlarged. Right vetricular wall thickness was not assessed. Right ventricular systolic function moderately to severely reduced. Left Atrium: Left atrial size was moderately dilated. Right Atrium: Right atrial size was moderately dilated. Pericardium: Trivial pericardial effusion is present. The pericardial effusion is circumferential. Mitral Valve: MR is eccentric and may be underestimated. The mitral valve is abnormal. There is mild thickening of the mitral valve leaflet(s). There is mild calcification of the mitral valve leaflet(s). Mild mitral annular calcification. Moderate mitral  valve regurgitation. No evidence of mitral valve stenosis. Tricuspid Valve: The tricuspid valve  is abnormal. Tricuspid valve regurgitation is moderate . No evidence of tricuspid stenosis. Aortic Valve: The aortic valve is tricuspid. There is mild calcification of the aortic valve. There is mild thickening of the aortic valve. There is mild aortic valve annular calcification. Aortic valve regurgitation is mild. No aortic stenosis is present. Aortic valve mean gradient measures 3.0 mmHg. Aortic valve peak gradient measures 5.9 mmHg. Pulmonic Valve: The pulmonic valve was not well visualized. Pulmonic valve regurgitation is mild. No evidence of pulmonic stenosis. Aorta: The aortic root is normal in size and structure. 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 Venous: The inferior vena cava is dilated in size with less than 50% respiratory variability, suggesting right atrial pressure of 15 mmHg. IAS/Shunts: No atrial level shunt detected by color flow Doppler. Additional Comments: There is a moderate pleural effusion in the left lateral region.  LEFT VENTRICLE PLAX 2D LVIDd:         5.91 cm  Diastology LVIDs:         5.61 cm  LV e' medial:    4.19 cm/s LV PW:         0.95 cm  LV E/e' medial:  29.4 LV IVS:        0.92 cm  LV e' lateral:   6.80 cm/s LVOT diam:     1.90 cm  LV E/e' lateral: 18.1 LVOT Area:     2.84 cm  RIGHT VENTRICLE RV S prime:     6.01 cm/s TAPSE (M-mode): 0.9 cm LEFT ATRIUM             Index       RIGHT ATRIUM           Index LA diam:        4.50 cm 2.52 cm/m  RA Area:     17.80 cm LA Vol (A2C):   77.9 ml 43.70 ml/m RA Volume:   56.30 ml  31.58 ml/m LA Vol (A4C):   62.7 ml 35.17 ml/m LA Biplane Vol: 70.3 ml 39.44 ml/m  AORTIC VALVE AV Vmax:      121.62 cm/s AV Vmean:     81.192 cm/s AV VTI:       0.149 m AV Peak Grad: 5.9 mmHg AV Mean Grad: 3.0 mmHg  AORTA Ao Root diam: 3.10 cm MITRAL VALVE                TRICUSPID VALVE MV Area (PHT): 5.79 cm     TR Peak grad:   46.5 mmHg MV Decel Time: 131 msec     TR Vmax:        341.00 cm/s MR Peak grad: 84.3 mmHg MR Mean  grad: 57.0 mmHg     SHUNTS MR Vmax:      459.00 cm/s   Systemic Diam: 1.90 cm  MR Vmean:     350.0 cm/s MV E velocity: 123.00 cm/s MV A velocity: 50.30 cm/s MV E/A ratio:  2.45 Carlyle Dolly MD Electronically signed by Carlyle Dolly MD Signature Date/Time: 08/14/2020/3:38:27 PM    Final        Subjective: Patient with no chest pain or dyspnea, no nausea or vomiting, continue to be very weak and deconditioned.   Discharge Exam: Vitals:   08/19/20 0900 08/19/20 1000  BP:    Pulse:    Resp: 15 (!) 23  Temp:    SpO2:     Vitals:   08/19/20 0800 08/19/20 0828 08/19/20 0900 08/19/20 1000  BP:  93/78    Pulse:      Resp: 14  15 (!) 23  Temp:      TempSrc:      SpO2:      Weight:      Height:        General: deconditioned.  Neurology: Awake and alert, non focal  E ENT: mild pallor, no icterus, oral mucosa moist Cardiovascular: No JVD. S1-S2 present, rhythmic, no gallops, rubs, or murmurs. No lower extremity edema. Pulmonary: vesicular breath sounds bilaterally, adequate air movement, no wheezing, rhonchi or rales. Gastrointestinal. Abdomen soft and non tender.  Skin. No rashes Musculoskeletal: no joint deformities   The results of significant diagnostics from this hospitalization (including imaging, microbiology, ancillary and laboratory) are listed below for reference.     Microbiology: Recent Results (from the past 240 hour(s))  Resp Panel by RT-PCR (Flu A&B, Covid) Nasopharyngeal Swab     Status: None   Collection Time: 08/14/20 12:19 PM   Specimen: Nasopharyngeal Swab; Nasopharyngeal(NP) swabs in vial transport medium  Result Value Ref Range Status   SARS Coronavirus 2 by RT PCR NEGATIVE NEGATIVE Final    Comment: (NOTE) SARS-CoV-2 target nucleic acids are NOT DETECTED.  The SARS-CoV-2 RNA is generally detectable in upper respiratory specimens during the acute phase of infection. The lowest concentration of SARS-CoV-2 viral copies this assay can detect is 138  copies/mL. A negative result does not preclude SARS-Cov-2 infection and should not be used as the sole basis for treatment or other patient management decisions. A negative result may occur with  improper specimen collection/handling, submission of specimen other than nasopharyngeal swab, presence of viral mutation(s) within the areas targeted by this assay, and inadequate number of viral copies(<138 copies/mL). A negative result must be combined with clinical observations, patient history, and epidemiological information. The expected result is Negative.  Fact Sheet for Patients:  EntrepreneurPulse.com.au  Fact Sheet for Healthcare Providers:  IncredibleEmployment.be  This test is no t yet approved or cleared by the [place] FDA and  has been authorized for detection and/or diagnosis of SARS-CoV-2 by FDA under an Emergency Use Authorization (EUA). This EUA will remain  in effect (meaning this test can be used) for the duration of the COVID-19 declaration under Section 564(b)(1) of the Act, 21 U.S.C.section 360bbb-3(b)(1), unless the authorization is terminated  or revoked sooner.       Influenza A by PCR NEGATIVE NEGATIVE Final   Influenza B by PCR NEGATIVE NEGATIVE Final    Comment: (NOTE) The Xpert Xpress SARS-CoV-2/FLU/RSV plus assay is intended as an aid in the diagnosis of influenza from Nasopharyngeal swab specimens and should not be used as a sole basis for treatment. Nasal washings and aspirates are unacceptable for Xpert Xpress SARS-CoV-2/FLU/RSV testing.  Fact Sheet for Patients: EntrepreneurPulse.com.au  Fact Sheet for Healthcare Providers: IncredibleEmployment.be  This test is not yet approved or cleared by the [place] and has been authorized for detection and/or diagnosis of SARS-CoV-2 by FDA under an Emergency Use Authorization (EUA). This EUA will remain in effect (meaning  this test can be used) for the duration of the COVID-19 declaration under Section 564(b)(1) of the Act, 21 U.S.C. section 360bbb-3(b)(1), unless the authorization is terminated or revoked.  Performed at [facility], [address], [place], [place] [address]   Blood culture (routine x 2)     Status: None   Collection Time: 08/14/20  1:27 PM   Specimen: Porta Cath; Blood  Result Value Ref Range Status   Specimen Description PORTA CATH  Final   Special Requests   Final    Blood Culture adequate volume BOTTLES DRAWN AEROBIC AND ANAEROBIC   Culture   Final    NO GROWTH 5 DAYS Performed at [facility], [address] [place], [place] [address]    Report Status 08/19/2020 FINAL  Final  Blood culture (routine x 2)     Status: None   Collection Time: 08/14/20  1:27 PM   Specimen: BLOOD LEFT HAND  Result Value Ref Range Status   Specimen Description BLOOD LEFT HAND  Final   Special Requests   Final    Blood Culture adequate volume BOTTLES DRAWN AEROBIC AND ANAEROBIC   Culture   Final    NO GROWTH 5 DAYS Performed at [facility], [address], [place], [place] [address]    Report Status 08/19/2020 FINAL  Final  MRSA PCR Screening     Status: None   Collection Time: 08/14/20 10:52 PM   Specimen: Nasal Mucosa; Nasopharyngeal  Result Value Ref Range Status   MRSA by PCR NEGATIVE NEGATIVE Final    Comment:        The GeneXpert MRSA Assay (FDA approved for NASAL specimens only), is one component of a comprehensive MRSA colonization surveillance program. It is not intended to diagnose MRSA infection nor to guide or monitor treatment for MRSA infections. Performed at [facility], [address] [place], [place] [address]   Culture, Urine     Status: None   Collection Time: 08/15/20 12:10 AM   Specimen: Urine, Random  Result Value Ref Range Status   Specimen Description   Final    URINE, RANDOM Performed at [facility], [address], [place], [place] [address]    Special  Requests   Final    NONE Performed at [facility], [address], [place], [place] [address]    Culture   Final    NO GROWTH Performed at [facility], [place] [address], [place],  [address]    Report Status 08/16/2020 FINAL  Final  MRSA PCR Screening     Status: None   Collection Time: 08/15/20 10:53 PM   Specimen: Nasopharyngeal  Result Value Ref Range Status   MRSA by PCR NEGATIVE NEGATIVE Final    Comment:        The GeneXpert MRSA Assay (FDA approved for NASAL specimens only), is one component of a comprehensive MRSA colonization surveillance program. It is not intended to diagnose MRSA infection nor to guide or monitor treatment for MRSA infections. Performed at [facility], [place] [address] [place],  [address]      Labs: BNP (last 3 results) Recent Labs    08/14/20 1208  BNP [id number]*   Basic Metabolic Panel: Recent Labs  Lab 08/15/20 0410 08/16/20 0027  08/16/20 0433 08/16/20 1140 08/17/20 0309 08/17/20 1234 08/18/20 0432  NA 138   < > 138 138 134* 134* 132*  K 3.8   < > 3.2* 4.0 4.0 4.0 4.0  CL 106   < > 107 109 103 103 103  CO2 16*   < > 22 19* 17* 18* 17*  GLUCOSE 134*   < > 109* 133* 131* 151* 120*  BUN 61*   < > 44* 44* 45* 46* 48*  CREATININE 2.60*   < > 2.15* 2.28* 2.68* 2.93* 3.55*  CALCIUM 8.2*   < > 7.4* 7.4* 7.5* 7.4* 7.0*  MG 2.0  --   --   --   --   --   --    < > = values in this interval not displayed.   Liver Function Tests: Recent Labs  Lab 08/14/20 1208 08/15/20 0410 08/16/20 0433 08/17/20 0309 08/18/20 0432  AST 106* 182* 177* 106* 77*  ALT 66* 111* 139* 117* 92*  ALKPHOS 182* 227* 182* 179* 187*  BILITOT 2.5* 2.0* 1.4* 1.6* 1.0  PROT 7.8 7.5 6.7 6.9 6.7  ALBUMIN 3.6 3.4* 2.9* 2.9* 2.8*   No results for input(s): LIPASE, AMYLASE in the last 168 hours. No results for input(s): AMMONIA in the last 168 hours. CBC: Recent Labs  Lab 08/14/20 1208 08/15/20 0410 08/16/20 0433 08/16/20 0730  08/17/20 0309 08/18/20 0432  WBC 9.3 11.6* 8.8 8.1 7.9 8.0  NEUTROABS 6.9  --   --   --   --   --   HGB 11.6* 11.4* 10.6* 10.6* 10.9* 10.4*  HCT 35.7* 35.2* 32.3* 32.8* 33.0* 31.7*  MCV 97.0 93.6 93.9 94.3 93.8 92.7  PLT 205 194 194 185 184 159   Cardiac Enzymes: No results for input(s): CKTOTAL, CKMB, CKMBINDEX, TROPONINI in the last 168 hours. BNP: Invalid input(s): POCBNP CBG: No results for input(s): GLUCAP in the last 168 hours. D-Dimer No results for input(s): DDIMER in the last 72 hours. Hgb A1c No results for input(s): HGBA1C in the last 72 hours. Lipid Profile No results for input(s): CHOL, HDL, LDLCALC, TRIG, CHOLHDL, LDLDIRECT in the last 72 hours. Thyroid function studies No results for input(s): TSH, T4TOTAL, T3FREE, THYROIDAB in the last 72 hours.  Invalid input(s): FREET3 Anemia work up No results for input(s): VITAMINB12, FOLATE, FERRITIN, TIBC, IRON, RETICCTPCT in the last 72 hours. Urinalysis    Component Value Date/Time   COLORURINE STRAW (A) 08/15/2020 0010   APPEARANCEUR CLEAR 08/15/2020 0010   LABSPEC 1.006 08/15/2020 0010   PHURINE 5.0 08/15/2020 0010   GLUCOSEU NEGATIVE 08/15/2020 0010   HGBUR MODERATE (A) 08/15/2020 0010   BILIRUBINUR NEGATIVE 08/15/2020 0010   KETONESUR NEGATIVE 08/15/2020 0010   PROTEINUR NEGATIVE 08/15/2020 0010   UROBILINOGEN 1.0 05/23/2014 1425   NITRITE NEGATIVE 08/15/2020 0010   LEUKOCYTESUR TRACE (A) 08/15/2020 0010   Sepsis Labs Invalid input(s): PROCALCITONIN,  WBC,  LACTICIDVEN Microbiology Recent Results (from the past 240 hour(s))  Resp Panel by RT-PCR (Flu A&B, Covid) Nasopharyngeal Swab     Status: None   Collection Time: 08/14/20 12:19 PM   Specimen: Nasopharyngeal Swab; Nasopharyngeal(NP) swabs in vial transport medium  Result Value Ref Range Status   SARS Coronavirus 2 by RT PCR NEGATIVE NEGATIVE Final    Comment: (NOTE) SARS-CoV-2 target nucleic acids are NOT DETECTED.  The SARS-CoV-2 RNA is generally  detectable in upper respiratory specimens during the acute phase of infection. The lowest concentration of SARS-CoV-2 viral copies this assay can  detect is 138 copies/mL. A negative result does not preclude SARS-Cov-2 infection and should not be used as the sole basis for treatment or other patient management decisions. A negative result may occur with  improper specimen collection/handling, submission of specimen other than nasopharyngeal swab, presence of viral mutation(s) within the areas targeted by this assay, and inadequate number of viral copies(<138 copies/mL). A negative result must be combined with clinical observations, patient history, and epidemiological information. The expected result is Negative.  Fact Sheet for Patients:  EntrepreneurPulse.com.au  Fact Sheet for Healthcare Providers:  IncredibleEmployment.be  This test is no t yet approved or cleared by the [place] FDA and  has been authorized for detection and/or diagnosis of SARS-CoV-2 by FDA under an Emergency Use Authorization (EUA). This EUA will remain  in effect (meaning this test can be used) for the duration of the COVID-19 declaration under Section 564(b)(1) of the Act, 21 U.S.C.section 360bbb-3(b)(1), unless the authorization is terminated  or revoked sooner.       Influenza A by PCR NEGATIVE NEGATIVE Final   Influenza B by PCR NEGATIVE NEGATIVE Final    Comment: (NOTE) The Xpert Xpress SARS-CoV-2/FLU/RSV plus assay is intended as an aid in the diagnosis of influenza from Nasopharyngeal swab specimens and should not be used as a sole basis for treatment. Nasal washings and aspirates are unacceptable for Xpert Xpress SARS-CoV-2/FLU/RSV testing.  Fact Sheet for Patients: EntrepreneurPulse.com.au  Fact Sheet for Healthcare Providers: IncredibleEmployment.be  This test is not yet approved or cleared by the [place] FDA  and has been authorized for detection and/or diagnosis of SARS-CoV-2 by FDA under an Emergency Use Authorization (EUA). This EUA will remain in effect (meaning this test can be used) for the duration of the COVID-19 declaration under Section 564(b)(1) of the Act, 21 U.S.C. section 360bbb-3(b)(1), unless the authorization is terminated or revoked.  Performed at [facility], [address], [place], [place] [address]   Blood culture (routine x 2)     Status: None   Collection Time: 08/14/20  1:27 PM   Specimen: Porta Cath; Blood  Result Value Ref Range Status   Specimen Description PORTA CATH  Final   Special Requests   Final    Blood Culture adequate volume BOTTLES DRAWN AEROBIC AND ANAEROBIC   Culture   Final    NO GROWTH 5 DAYS Performed at [facility], [address] [place], [place] [address]    Report Status 08/19/2020 FINAL  Final  Blood culture (routine x 2)     Status: None   Collection Time: 08/14/20  1:27 PM   Specimen: BLOOD LEFT HAND  Result Value Ref Range Status   Specimen Description BLOOD LEFT HAND  Final   Special Requests   Final    Blood Culture adequate volume BOTTLES DRAWN AEROBIC AND ANAEROBIC   Culture   Final    NO GROWTH 5 DAYS Performed at [facility], [address], [place], [place] [address]    Report Status 08/19/2020 FINAL  Final  MRSA PCR Screening     Status: None   Collection Time: 08/14/20 10:52 PM   Specimen: Nasal Mucosa; Nasopharyngeal  Result Value Ref Range Status   MRSA by PCR NEGATIVE NEGATIVE Final    Comment:        The GeneXpert MRSA Assay (FDA approved for NASAL specimens only), is one component of a comprehensive MRSA colonization surveillance program. It is not intended to diagnose MRSA infection nor to guide or monitor treatment  for MRSA infections. Performed at [facility], [address] [place], [place] [address]   Culture, Urine     Status: None   Collection Time: 08/15/20 12:10 AM   Specimen: Urine, Random   Result Value Ref Range Status   Specimen Description   Final    URINE, RANDOM Performed at [facility], [address], [place], [place] [address]    Special Requests   Final    NONE Performed at [facility], [address] [place], [place] [address]    Culture   Final    NO GROWTH Performed at [facility], [place] [address], [place], [place] [address]    Report Status 08/16/2020 FINAL  Final  MRSA PCR Screening     Status: None   Collection Time: 08/15/20 10:53 PM   Specimen: Nasopharyngeal  Result Value Ref Range Status   MRSA by PCR NEGATIVE NEGATIVE Final    Comment:        The GeneXpert MRSA Assay (FDA approved for NASAL specimens only), is one component of a comprehensive MRSA colonization surveillance program. It is not intended to diagnose MRSA infection nor to guide or monitor treatment for MRSA infections. Performed at [facility], [place] [address], [place], [place] [address]      Time coordinating discharge: 45 minutes  SIGNED:   Tawni Millers, MD  [facility] 08/19/2020, 12:46 PM

## 2020-08-19 NOTE — TOC Progression Note (Signed)
Transition of Care (TOC) - Progression Note  Heart Failure   Patient Details  Name: Jeff [name] MRN: [id number] Date of Birth: March 28, 1946  Transition of Care [facility]) CM/SW [place], [place] Phone Number: 08/19/2020, 1:58 PM  Clinical Narrative:    CSW attempted to contact palliative NP Stanton Kidney to discuss further however no answer and CSW left a voicemail for her to return the call. [facility] has [facility] bed available today and indicated they do not hold beds.  TOC will continue to follow for d/c needs.    Expected Discharge Plan:  Barriers to Discharge: Continued Medical Work up  Expected Discharge Plan and Services Expected Discharge Plan: [place] In-house Referral: Clinical Social Work   Post Acute Care Choice: Hospice                                         Social Determinants of Health (SDOH) Interventions    Readmission Risk Interventions No flowsheet data found.  Kaleen Rochette, MSW, [place] Heart Failure Social Worker

## 2020-09-04 ENCOUNTER — Inpatient Hospital Stay (HOSPITAL_COMMUNITY): Attending: Hematology

## 2020-09-04 ENCOUNTER — Inpatient Hospital Stay (HOSPITAL_COMMUNITY)

## 2020-09-24 DEATH — deceased

## 2020-09-25 ENCOUNTER — Encounter (HOSPITAL_COMMUNITY): Payer: Self-pay | Admitting: Hematology

## 2020-09-26 ENCOUNTER — Encounter (HOSPITAL_COMMUNITY): Payer: Self-pay | Admitting: Hematology

## 2020-09-27 ENCOUNTER — Encounter (HOSPITAL_COMMUNITY): Payer: Self-pay | Admitting: Hematology

## 2020-09-28 ENCOUNTER — Encounter (HOSPITAL_COMMUNITY): Payer: Self-pay | Admitting: Hematology

## 2020-09-29 ENCOUNTER — Encounter (HOSPITAL_COMMUNITY): Payer: Self-pay | Admitting: Hematology

## 2020-09-30 ENCOUNTER — Encounter (HOSPITAL_COMMUNITY): Payer: Self-pay | Admitting: Hematology

## 2020-10-01 ENCOUNTER — Encounter (HOSPITAL_COMMUNITY): Payer: Self-pay | Admitting: Hematology

## 2020-10-02 ENCOUNTER — Ambulatory Visit (HOSPITAL_COMMUNITY): Payer: Medicare Other

## 2020-10-02 ENCOUNTER — Encounter (HOSPITAL_COMMUNITY): Payer: Self-pay | Admitting: Hematology

## 2020-10-02 ENCOUNTER — Inpatient Hospital Stay (HOSPITAL_COMMUNITY)

## 2020-10-02 ENCOUNTER — Inpatient Hospital Stay (HOSPITAL_COMMUNITY): Admitting: Hematology
